# Patient Record
Sex: Female | Born: 1960
Health system: Southern US, Community
[De-identification: ages and names within clinical notes are randomized; demographics above are authoritative.]

## PROBLEM LIST (undated history)

## (undated) DIAGNOSIS — R350 Frequency of micturition: Secondary | ICD-10-CM

## (undated) DIAGNOSIS — Z87442 Personal history of urinary calculi: Secondary | ICD-10-CM

## (undated) DIAGNOSIS — F609 Personality disorder, unspecified: Secondary | ICD-10-CM

## (undated) DIAGNOSIS — R351 Nocturia: Secondary | ICD-10-CM

## (undated) DIAGNOSIS — F419 Anxiety disorder, unspecified: Secondary | ICD-10-CM

## (undated) DIAGNOSIS — T8859XA Other complications of anesthesia, initial encounter: Secondary | ICD-10-CM

## (undated) DIAGNOSIS — R102 Pelvic and perineal pain unspecified side: Secondary | ICD-10-CM

## (undated) DIAGNOSIS — F4010 Social phobia, unspecified: Secondary | ICD-10-CM

## (undated) DIAGNOSIS — R3915 Urgency of urination: Secondary | ICD-10-CM

## (undated) DIAGNOSIS — F250 Schizoaffective disorder, bipolar type: Secondary | ICD-10-CM

## (undated) DIAGNOSIS — R7989 Other specified abnormal findings of blood chemistry: Secondary | ICD-10-CM

## (undated) DIAGNOSIS — K589 Irritable bowel syndrome without diarrhea: Secondary | ICD-10-CM

## (undated) DIAGNOSIS — R51 Headache: Secondary | ICD-10-CM

## (undated) DIAGNOSIS — T4145XA Adverse effect of unspecified anesthetic, initial encounter: Secondary | ICD-10-CM

## (undated) DIAGNOSIS — Z8719 Personal history of other diseases of the digestive system: Secondary | ICD-10-CM

## (undated) DIAGNOSIS — F319 Bipolar disorder, unspecified: Secondary | ICD-10-CM

## (undated) DIAGNOSIS — E785 Hyperlipidemia, unspecified: Secondary | ICD-10-CM

## (undated) HISTORY — DX: Irritable bowel syndrome without diarrhea: K58.9

## (undated) HISTORY — DX: Hyperlipidemia, unspecified: E78.5

## (undated) HISTORY — DX: Other specified abnormal findings of blood chemistry: R79.89

## (undated) HISTORY — DX: Anxiety disorder, unspecified: F41.9

## (undated) HISTORY — DX: Schizoaffective disorder, bipolar type: F25.0

## (undated) HISTORY — DX: Social phobia, unspecified: F40.10

## (undated) HISTORY — PX: OTHER SURGICAL HISTORY: SHX169

## (undated) HISTORY — PX: CARDIAC CATHETERIZATION: SHX172

## (undated) HISTORY — DX: Headache: R51

---

## 1998-07-26 ENCOUNTER — Encounter: Payer: Self-pay | Admitting: Gastroenterology

## 1998-07-26 ENCOUNTER — Ambulatory Visit (HOSPITAL_COMMUNITY): Admission: RE | Admit: 1998-07-26 | Discharge: 1998-07-26 | Payer: Self-pay | Admitting: Gastroenterology

## 1998-08-18 HISTORY — PX: TOTAL ABDOMINAL HYSTERECTOMY: SHX209

## 1998-11-05 ENCOUNTER — Inpatient Hospital Stay (HOSPITAL_COMMUNITY): Admission: RE | Admit: 1998-11-05 | Discharge: 1998-11-07 | Payer: Self-pay | Admitting: Obstetrics and Gynecology

## 1999-08-19 DIAGNOSIS — I209 Angina pectoris, unspecified: Secondary | ICD-10-CM

## 1999-08-19 HISTORY — DX: Angina pectoris, unspecified: I20.9

## 1999-08-26 ENCOUNTER — Emergency Department (HOSPITAL_COMMUNITY): Admission: EM | Admit: 1999-08-26 | Discharge: 1999-08-26 | Payer: Self-pay | Admitting: Emergency Medicine

## 1999-09-12 ENCOUNTER — Encounter: Payer: Self-pay | Admitting: Emergency Medicine

## 1999-09-12 ENCOUNTER — Inpatient Hospital Stay (HOSPITAL_COMMUNITY): Admission: EM | Admit: 1999-09-12 | Discharge: 1999-09-17 | Payer: Self-pay | Admitting: Emergency Medicine

## 1999-09-13 ENCOUNTER — Encounter: Payer: Self-pay | Admitting: Internal Medicine

## 1999-09-16 ENCOUNTER — Encounter: Payer: Self-pay | Admitting: Internal Medicine

## 1999-09-17 ENCOUNTER — Encounter: Payer: Self-pay | Admitting: Internal Medicine

## 1999-09-25 ENCOUNTER — Other Ambulatory Visit: Admission: RE | Admit: 1999-09-25 | Discharge: 1999-09-25 | Payer: Self-pay | Admitting: *Deleted

## 1999-09-29 ENCOUNTER — Emergency Department (HOSPITAL_COMMUNITY): Admission: EM | Admit: 1999-09-29 | Discharge: 1999-09-29 | Payer: Self-pay | Admitting: Emergency Medicine

## 1999-09-29 ENCOUNTER — Encounter: Payer: Self-pay | Admitting: Emergency Medicine

## 1999-10-07 ENCOUNTER — Inpatient Hospital Stay (HOSPITAL_COMMUNITY): Admission: EM | Admit: 1999-10-07 | Discharge: 1999-10-10 | Payer: Self-pay | Admitting: Emergency Medicine

## 1999-11-06 ENCOUNTER — Ambulatory Visit: Admission: RE | Admit: 1999-11-06 | Discharge: 1999-11-06 | Payer: Self-pay | Admitting: Pulmonary Disease

## 2000-03-03 ENCOUNTER — Encounter: Admission: RE | Admit: 2000-03-03 | Discharge: 2000-03-03 | Payer: Self-pay | Admitting: Obstetrics and Gynecology

## 2000-03-03 ENCOUNTER — Encounter: Payer: Self-pay | Admitting: Obstetrics and Gynecology

## 2000-03-11 ENCOUNTER — Inpatient Hospital Stay (HOSPITAL_COMMUNITY): Admission: EM | Admit: 2000-03-11 | Discharge: 2000-03-13 | Payer: Self-pay | Admitting: *Deleted

## 2000-05-12 ENCOUNTER — Encounter: Payer: Self-pay | Admitting: Emergency Medicine

## 2000-05-12 ENCOUNTER — Emergency Department (HOSPITAL_COMMUNITY): Admission: EM | Admit: 2000-05-12 | Discharge: 2000-05-12 | Payer: Self-pay | Admitting: Emergency Medicine

## 2000-06-03 ENCOUNTER — Ambulatory Visit (HOSPITAL_COMMUNITY): Admission: RE | Admit: 2000-06-03 | Discharge: 2000-06-03 | Payer: Self-pay | Admitting: Gastroenterology

## 2000-10-27 ENCOUNTER — Other Ambulatory Visit: Admission: RE | Admit: 2000-10-27 | Discharge: 2000-10-27 | Payer: Self-pay | Admitting: *Deleted

## 2000-11-02 ENCOUNTER — Encounter: Payer: Self-pay | Admitting: Obstetrics and Gynecology

## 2000-11-02 ENCOUNTER — Encounter: Admission: RE | Admit: 2000-11-02 | Discharge: 2000-11-02 | Payer: Self-pay | Admitting: *Deleted

## 2000-11-18 ENCOUNTER — Encounter: Payer: Self-pay | Admitting: Emergency Medicine

## 2000-11-18 ENCOUNTER — Emergency Department (HOSPITAL_COMMUNITY): Admission: EM | Admit: 2000-11-18 | Discharge: 2000-11-18 | Payer: Self-pay | Admitting: Emergency Medicine

## 2001-02-07 ENCOUNTER — Inpatient Hospital Stay (HOSPITAL_COMMUNITY): Admission: EM | Admit: 2001-02-07 | Discharge: 2001-02-09 | Payer: Self-pay | Admitting: Psychiatry

## 2001-10-16 ENCOUNTER — Emergency Department (HOSPITAL_COMMUNITY): Admission: EM | Admit: 2001-10-16 | Discharge: 2001-10-16 | Payer: Self-pay | Admitting: Emergency Medicine

## 2001-10-16 ENCOUNTER — Encounter: Payer: Self-pay | Admitting: Emergency Medicine

## 2001-10-21 ENCOUNTER — Encounter: Payer: Self-pay | Admitting: *Deleted

## 2001-10-21 ENCOUNTER — Encounter: Admission: RE | Admit: 2001-10-21 | Discharge: 2001-10-21 | Payer: Self-pay | Admitting: *Deleted

## 2001-10-29 ENCOUNTER — Encounter: Payer: Self-pay | Admitting: Gastroenterology

## 2001-10-29 ENCOUNTER — Ambulatory Visit (HOSPITAL_COMMUNITY): Admission: RE | Admit: 2001-10-29 | Discharge: 2001-10-29 | Payer: Self-pay | Admitting: Gastroenterology

## 2001-11-03 ENCOUNTER — Other Ambulatory Visit: Admission: RE | Admit: 2001-11-03 | Discharge: 2001-11-03 | Payer: Self-pay | Admitting: Obstetrics and Gynecology

## 2001-11-08 ENCOUNTER — Encounter: Payer: Self-pay | Admitting: Surgery

## 2001-11-08 ENCOUNTER — Ambulatory Visit (HOSPITAL_COMMUNITY): Admission: RE | Admit: 2001-11-08 | Discharge: 2001-11-09 | Payer: Self-pay | Admitting: Surgery

## 2001-11-08 ENCOUNTER — Encounter (INDEPENDENT_AMBULATORY_CARE_PROVIDER_SITE_OTHER): Payer: Self-pay | Admitting: *Deleted

## 2001-11-08 HISTORY — PX: LAPAROSCOPIC CHOLECYSTECTOMY: SUR755

## 2002-03-03 ENCOUNTER — Encounter: Admission: RE | Admit: 2002-03-03 | Discharge: 2002-03-03 | Payer: Self-pay | Admitting: Gastroenterology

## 2002-03-03 ENCOUNTER — Encounter: Payer: Self-pay | Admitting: Gastroenterology

## 2002-03-06 ENCOUNTER — Encounter: Payer: Self-pay | Admitting: *Deleted

## 2002-03-06 ENCOUNTER — Emergency Department (HOSPITAL_COMMUNITY): Admission: EM | Admit: 2002-03-06 | Discharge: 2002-03-06 | Payer: Self-pay | Admitting: *Deleted

## 2002-03-08 ENCOUNTER — Ambulatory Visit (HOSPITAL_COMMUNITY): Admission: RE | Admit: 2002-03-08 | Discharge: 2002-03-08 | Payer: Self-pay | Admitting: Gastroenterology

## 2002-03-10 HISTORY — PX: OTHER SURGICAL HISTORY: SHX169

## 2002-03-11 ENCOUNTER — Encounter (INDEPENDENT_AMBULATORY_CARE_PROVIDER_SITE_OTHER): Payer: Self-pay | Admitting: Specialist

## 2002-03-11 ENCOUNTER — Encounter (INDEPENDENT_AMBULATORY_CARE_PROVIDER_SITE_OTHER): Payer: Self-pay

## 2002-03-11 ENCOUNTER — Observation Stay (HOSPITAL_COMMUNITY): Admission: RE | Admit: 2002-03-11 | Discharge: 2002-03-12 | Payer: Self-pay | Admitting: Obstetrics and Gynecology

## 2002-08-02 ENCOUNTER — Encounter: Admission: RE | Admit: 2002-08-02 | Discharge: 2002-08-02 | Payer: Self-pay | Admitting: Gastroenterology

## 2002-08-02 ENCOUNTER — Encounter: Payer: Self-pay | Admitting: Gastroenterology

## 2002-08-11 ENCOUNTER — Emergency Department (HOSPITAL_COMMUNITY): Admission: EM | Admit: 2002-08-11 | Discharge: 2002-08-11 | Payer: Self-pay | Admitting: Emergency Medicine

## 2002-08-22 ENCOUNTER — Encounter: Payer: Self-pay | Admitting: Gastroenterology

## 2002-08-22 ENCOUNTER — Encounter: Admission: RE | Admit: 2002-08-22 | Discharge: 2002-08-22 | Payer: Self-pay | Admitting: Gastroenterology

## 2002-08-31 ENCOUNTER — Encounter: Admission: RE | Admit: 2002-08-31 | Discharge: 2002-08-31 | Payer: Self-pay | Admitting: Obstetrics and Gynecology

## 2002-08-31 ENCOUNTER — Encounter: Payer: Self-pay | Admitting: Obstetrics and Gynecology

## 2002-09-06 ENCOUNTER — Encounter: Payer: Self-pay | Admitting: Internal Medicine

## 2003-10-16 ENCOUNTER — Emergency Department (HOSPITAL_COMMUNITY): Admission: EM | Admit: 2003-10-16 | Discharge: 2003-10-17 | Payer: Self-pay | Admitting: Emergency Medicine

## 2004-08-06 ENCOUNTER — Emergency Department (HOSPITAL_COMMUNITY): Admission: EM | Admit: 2004-08-06 | Discharge: 2004-08-06 | Payer: Self-pay | Admitting: *Deleted

## 2004-09-30 ENCOUNTER — Ambulatory Visit: Payer: Self-pay | Admitting: Internal Medicine

## 2005-02-24 ENCOUNTER — Ambulatory Visit: Payer: Self-pay | Admitting: Family Medicine

## 2005-07-16 ENCOUNTER — Emergency Department (HOSPITAL_COMMUNITY): Admission: EM | Admit: 2005-07-16 | Discharge: 2005-07-16 | Payer: Self-pay | Admitting: Family Medicine

## 2005-07-21 ENCOUNTER — Ambulatory Visit (HOSPITAL_COMMUNITY): Admission: RE | Admit: 2005-07-21 | Discharge: 2005-07-21 | Payer: Self-pay | Admitting: Gastroenterology

## 2005-11-19 ENCOUNTER — Inpatient Hospital Stay (HOSPITAL_COMMUNITY): Admission: RE | Admit: 2005-11-19 | Discharge: 2005-11-22 | Payer: Self-pay | Admitting: Psychiatry

## 2005-11-20 ENCOUNTER — Ambulatory Visit: Payer: Self-pay | Admitting: Psychiatry

## 2005-12-04 ENCOUNTER — Inpatient Hospital Stay (HOSPITAL_COMMUNITY): Admission: RE | Admit: 2005-12-04 | Discharge: 2005-12-10 | Payer: Self-pay | Admitting: Psychiatry

## 2005-12-12 ENCOUNTER — Ambulatory Visit: Payer: Self-pay | Admitting: Internal Medicine

## 2006-01-01 ENCOUNTER — Encounter: Payer: Self-pay | Admitting: Internal Medicine

## 2006-01-01 ENCOUNTER — Ambulatory Visit: Payer: Self-pay | Admitting: Endocrinology

## 2006-01-07 ENCOUNTER — Ambulatory Visit (HOSPITAL_COMMUNITY): Admission: RE | Admit: 2006-01-07 | Discharge: 2006-01-07 | Payer: Self-pay | Admitting: Endocrinology

## 2006-03-03 LAB — HM DEXA SCAN

## 2006-04-02 ENCOUNTER — Ambulatory Visit: Payer: Self-pay | Admitting: Family Medicine

## 2006-04-04 ENCOUNTER — Emergency Department (HOSPITAL_COMMUNITY): Admission: EM | Admit: 2006-04-04 | Discharge: 2006-04-04 | Payer: Self-pay | Admitting: Emergency Medicine

## 2006-07-17 ENCOUNTER — Ambulatory Visit: Payer: Self-pay | Admitting: Internal Medicine

## 2006-07-17 LAB — CONVERTED CEMR LAB
BUN: 9 mg/dL (ref 6–23)
Basophils Relative: 0.1 % (ref 0.0–1.0)
Calcium: 9.8 mg/dL (ref 8.4–10.5)
Chloride: 105 meq/L (ref 96–112)
Free T4: 0.6 ng/dL — ABNORMAL LOW (ref 0.9–1.8)
GFR calc non Af Amer: 72 mL/min
Glomerular Filtration Rate, Af Am: 87 mL/min/{1.73_m2}
Hemoglobin: 14 g/dL (ref 12.0–15.0)
Lymphocytes Relative: 21.2 % (ref 12.0–46.0)
MCHC: 33.6 g/dL (ref 30.0–36.0)
Monocytes Absolute: 0.5 10*3/uL (ref 0.2–0.7)
Monocytes Relative: 8.4 % (ref 3.0–11.0)
Neutro Abs: 4 10*3/uL (ref 1.4–7.7)
Platelets: 320 10*3/uL (ref 150–400)
T3, Free: 3 pg/mL (ref 2.3–4.2)
TSH: 1.15 microintl units/mL (ref 0.35–5.50)

## 2006-08-06 ENCOUNTER — Ambulatory Visit: Payer: Self-pay | Admitting: Internal Medicine

## 2006-10-12 ENCOUNTER — Encounter: Admission: RE | Admit: 2006-10-12 | Discharge: 2006-10-12 | Payer: Self-pay | Admitting: Internal Medicine

## 2006-10-12 ENCOUNTER — Ambulatory Visit: Payer: Self-pay | Admitting: Internal Medicine

## 2006-10-13 ENCOUNTER — Emergency Department (HOSPITAL_COMMUNITY): Admission: EM | Admit: 2006-10-13 | Discharge: 2006-10-13 | Payer: Self-pay | Admitting: Emergency Medicine

## 2006-11-03 ENCOUNTER — Encounter: Payer: Self-pay | Admitting: Internal Medicine

## 2006-11-18 ENCOUNTER — Encounter (INDEPENDENT_AMBULATORY_CARE_PROVIDER_SITE_OTHER): Payer: Self-pay | Admitting: Specialist

## 2006-11-18 ENCOUNTER — Ambulatory Visit (HOSPITAL_COMMUNITY): Admission: RE | Admit: 2006-11-18 | Discharge: 2006-11-18 | Payer: Self-pay | Admitting: Gastroenterology

## 2007-03-04 ENCOUNTER — Emergency Department (HOSPITAL_COMMUNITY): Admission: EM | Admit: 2007-03-04 | Discharge: 2007-03-04 | Payer: Self-pay | Admitting: Emergency Medicine

## 2007-03-06 ENCOUNTER — Ambulatory Visit: Payer: Self-pay | Admitting: Internal Medicine

## 2007-03-08 ENCOUNTER — Ambulatory Visit: Payer: Self-pay | Admitting: Internal Medicine

## 2007-03-08 DIAGNOSIS — F319 Bipolar disorder, unspecified: Secondary | ICD-10-CM | POA: Insufficient documentation

## 2007-03-08 LAB — CONVERTED CEMR LAB: Lithium Lvl: 0.49 meq/L — ABNORMAL LOW (ref 0.80–1.40)

## 2007-03-12 ENCOUNTER — Encounter: Payer: Self-pay | Admitting: Internal Medicine

## 2007-03-12 LAB — CONVERTED CEMR LAB
BUN: 9 mg/dL (ref 6–23)
GFR calc Af Amer: 100 mL/min
GFR calc non Af Amer: 82 mL/min
Potassium: 4.5 meq/L (ref 3.5–5.1)
Sodium: 140 meq/L (ref 135–145)

## 2007-03-25 ENCOUNTER — Encounter: Payer: Self-pay | Admitting: Internal Medicine

## 2007-03-26 ENCOUNTER — Ambulatory Visit: Payer: Self-pay | Admitting: Internal Medicine

## 2007-03-26 DIAGNOSIS — J309 Allergic rhinitis, unspecified: Secondary | ICD-10-CM

## 2007-03-26 DIAGNOSIS — R51 Headache: Secondary | ICD-10-CM | POA: Insufficient documentation

## 2007-03-26 DIAGNOSIS — R079 Chest pain, unspecified: Secondary | ICD-10-CM | POA: Insufficient documentation

## 2007-03-26 DIAGNOSIS — R5381 Other malaise: Secondary | ICD-10-CM | POA: Insufficient documentation

## 2007-03-26 DIAGNOSIS — R5383 Other fatigue: Secondary | ICD-10-CM

## 2007-03-26 DIAGNOSIS — R519 Headache, unspecified: Secondary | ICD-10-CM | POA: Insufficient documentation

## 2007-03-26 HISTORY — DX: Allergic rhinitis, unspecified: J30.9

## 2007-03-29 ENCOUNTER — Telehealth: Payer: Self-pay | Admitting: Family Medicine

## 2007-05-21 ENCOUNTER — Encounter: Payer: Self-pay | Admitting: *Deleted

## 2007-06-04 ENCOUNTER — Telehealth (INDEPENDENT_AMBULATORY_CARE_PROVIDER_SITE_OTHER): Payer: Self-pay | Admitting: *Deleted

## 2007-06-11 ENCOUNTER — Ambulatory Visit: Payer: Self-pay | Admitting: Internal Medicine

## 2007-06-11 DIAGNOSIS — E559 Vitamin D deficiency, unspecified: Secondary | ICD-10-CM

## 2007-06-11 DIAGNOSIS — M722 Plantar fascial fibromatosis: Secondary | ICD-10-CM | POA: Insufficient documentation

## 2007-06-11 HISTORY — DX: Plantar fascial fibromatosis: M72.2

## 2007-07-03 ENCOUNTER — Telehealth: Payer: Self-pay | Admitting: Family Medicine

## 2007-07-03 ENCOUNTER — Emergency Department (HOSPITAL_COMMUNITY): Admission: EM | Admit: 2007-07-03 | Discharge: 2007-07-03 | Payer: Self-pay | Admitting: Emergency Medicine

## 2007-07-04 ENCOUNTER — Emergency Department (HOSPITAL_COMMUNITY): Admission: EM | Admit: 2007-07-04 | Discharge: 2007-07-04 | Payer: Self-pay | Admitting: Emergency Medicine

## 2007-07-05 ENCOUNTER — Telehealth: Payer: Self-pay | Admitting: Internal Medicine

## 2007-07-05 ENCOUNTER — Ambulatory Visit: Payer: Self-pay | Admitting: Internal Medicine

## 2007-09-02 ENCOUNTER — Encounter: Payer: Self-pay | Admitting: Internal Medicine

## 2007-09-06 ENCOUNTER — Encounter: Payer: Self-pay | Admitting: Internal Medicine

## 2007-10-26 ENCOUNTER — Telehealth: Payer: Self-pay | Admitting: Internal Medicine

## 2007-11-25 ENCOUNTER — Encounter: Payer: Self-pay | Admitting: Internal Medicine

## 2007-11-25 ENCOUNTER — Encounter: Admission: RE | Admit: 2007-11-25 | Discharge: 2007-11-25 | Payer: Self-pay | Admitting: Gastroenterology

## 2007-11-27 ENCOUNTER — Inpatient Hospital Stay (HOSPITAL_COMMUNITY): Admission: EM | Admit: 2007-11-27 | Discharge: 2007-12-02 | Payer: Self-pay | Admitting: Emergency Medicine

## 2007-12-02 ENCOUNTER — Ambulatory Visit: Payer: Self-pay | Admitting: Internal Medicine

## 2007-12-23 ENCOUNTER — Encounter: Payer: Self-pay | Admitting: Internal Medicine

## 2008-01-14 ENCOUNTER — Encounter: Admission: RE | Admit: 2008-01-14 | Discharge: 2008-01-14 | Payer: Self-pay | Admitting: Gastroenterology

## 2008-01-25 ENCOUNTER — Encounter: Payer: Self-pay | Admitting: Internal Medicine

## 2008-02-16 ENCOUNTER — Emergency Department (HOSPITAL_COMMUNITY): Admission: EM | Admit: 2008-02-16 | Discharge: 2008-02-16 | Payer: Self-pay | Admitting: Family Medicine

## 2008-05-11 ENCOUNTER — Ambulatory Visit: Payer: Self-pay | Admitting: Internal Medicine

## 2008-05-11 DIAGNOSIS — N809 Endometriosis, unspecified: Secondary | ICD-10-CM

## 2008-05-11 DIAGNOSIS — R350 Frequency of micturition: Secondary | ICD-10-CM | POA: Insufficient documentation

## 2008-05-11 DIAGNOSIS — M549 Dorsalgia, unspecified: Secondary | ICD-10-CM | POA: Insufficient documentation

## 2008-05-11 HISTORY — DX: Endometriosis, unspecified: N80.9

## 2008-05-11 LAB — CONVERTED CEMR LAB
Bilirubin Urine: NEGATIVE
Glucose, Urine, Semiquant: NEGATIVE
Ketones, urine, test strip: NEGATIVE
Protein, U semiquant: 30
Specific Gravity, Urine: 1.02

## 2008-05-12 ENCOUNTER — Encounter: Payer: Self-pay | Admitting: Internal Medicine

## 2008-06-09 ENCOUNTER — Ambulatory Visit: Payer: Self-pay | Admitting: Internal Medicine

## 2008-06-09 DIAGNOSIS — F4321 Adjustment disorder with depressed mood: Secondary | ICD-10-CM | POA: Insufficient documentation

## 2008-06-12 ENCOUNTER — Ambulatory Visit: Payer: Self-pay | Admitting: Internal Medicine

## 2008-06-12 ENCOUNTER — Telehealth: Payer: Self-pay | Admitting: Internal Medicine

## 2008-06-14 ENCOUNTER — Telehealth: Payer: Self-pay | Admitting: Internal Medicine

## 2008-06-14 ENCOUNTER — Ambulatory Visit: Payer: Self-pay | Admitting: Internal Medicine

## 2008-06-19 LAB — CONVERTED CEMR LAB
ALT: 17 units/L (ref 0–35)
Albumin: 3.9 g/dL (ref 3.5–5.2)
Basophils Relative: 0.8 % (ref 0.0–3.0)
CO2: 26 meq/L (ref 19–32)
CRP, High Sensitivity: 1 (ref 0.00–5.00)
Calcium: 9.1 mg/dL (ref 8.4–10.5)
Creatinine, Ser: 0.7 mg/dL (ref 0.4–1.2)
Glucose, Bld: 84 mg/dL (ref 70–99)
HCT: 41.4 % (ref 36.0–46.0)
Hemoglobin: 14.3 g/dL (ref 12.0–15.0)
Lymphocytes Relative: 24.3 % (ref 12.0–46.0)
MCHC: 34.5 g/dL (ref 30.0–36.0)
Monocytes Relative: 5.9 % (ref 3.0–12.0)
Neutro Abs: 4.1 10*3/uL (ref 1.4–7.7)
Nitrite: NEGATIVE
RBC: 4.63 M/uL (ref 3.87–5.11)
Total CK: 84 units/L (ref 7–177)
Total Protein: 7 g/dL (ref 6.0–8.3)
Urobilinogen, UA: 0.2

## 2008-06-20 ENCOUNTER — Emergency Department (HOSPITAL_COMMUNITY): Admission: EM | Admit: 2008-06-20 | Discharge: 2008-06-21 | Payer: Self-pay | Admitting: Emergency Medicine

## 2008-06-21 ENCOUNTER — Ambulatory Visit: Payer: Self-pay | Admitting: *Deleted

## 2008-06-21 ENCOUNTER — Inpatient Hospital Stay (HOSPITAL_COMMUNITY): Admission: AD | Admit: 2008-06-21 | Discharge: 2008-06-27 | Payer: Self-pay | Admitting: *Deleted

## 2008-07-07 ENCOUNTER — Encounter: Payer: Self-pay | Admitting: Internal Medicine

## 2008-07-24 ENCOUNTER — Encounter: Payer: Self-pay | Admitting: Internal Medicine

## 2008-09-15 ENCOUNTER — Ambulatory Visit: Payer: Self-pay | Admitting: Internal Medicine

## 2008-09-15 LAB — CONVERTED CEMR LAB: Rapid Strep: NEGATIVE

## 2008-09-16 ENCOUNTER — Encounter: Payer: Self-pay | Admitting: Internal Medicine

## 2008-10-03 ENCOUNTER — Encounter: Payer: Self-pay | Admitting: Internal Medicine

## 2008-10-27 ENCOUNTER — Encounter: Admission: RE | Admit: 2008-10-27 | Discharge: 2008-10-27 | Payer: Self-pay | Admitting: Obstetrics and Gynecology

## 2008-11-05 ENCOUNTER — Emergency Department (HOSPITAL_COMMUNITY): Admission: EM | Admit: 2008-11-05 | Discharge: 2008-11-05 | Payer: Self-pay | Admitting: Emergency Medicine

## 2008-11-06 ENCOUNTER — Telehealth (INDEPENDENT_AMBULATORY_CARE_PROVIDER_SITE_OTHER): Payer: Self-pay | Admitting: *Deleted

## 2008-11-07 ENCOUNTER — Encounter: Payer: Self-pay | Admitting: Internal Medicine

## 2008-11-07 ENCOUNTER — Encounter: Payer: Self-pay | Admitting: *Deleted

## 2008-11-07 LAB — CONVERTED CEMR LAB: HCT: 41.7 %

## 2008-11-10 ENCOUNTER — Encounter: Payer: Self-pay | Admitting: Internal Medicine

## 2008-11-10 ENCOUNTER — Encounter: Admission: RE | Admit: 2008-11-10 | Discharge: 2008-11-10 | Payer: Self-pay | Admitting: Gastroenterology

## 2008-11-27 ENCOUNTER — Encounter: Payer: Self-pay | Admitting: *Deleted

## 2008-12-12 ENCOUNTER — Encounter: Payer: Self-pay | Admitting: Internal Medicine

## 2009-01-17 ENCOUNTER — Encounter: Payer: Self-pay | Admitting: Internal Medicine

## 2009-02-13 ENCOUNTER — Encounter: Payer: Self-pay | Admitting: Internal Medicine

## 2009-06-13 ENCOUNTER — Telehealth: Payer: Self-pay | Admitting: *Deleted

## 2009-06-13 ENCOUNTER — Ambulatory Visit: Payer: Self-pay | Admitting: Internal Medicine

## 2009-06-18 ENCOUNTER — Ambulatory Visit: Payer: Self-pay | Admitting: Internal Medicine

## 2009-06-18 DIAGNOSIS — R339 Retention of urine, unspecified: Secondary | ICD-10-CM | POA: Insufficient documentation

## 2009-06-18 LAB — CONVERTED CEMR LAB
Bilirubin Urine: NEGATIVE
Blood in Urine, dipstick: NEGATIVE
Glucose, Urine, Semiquant: NEGATIVE
Ketones, urine, test strip: NEGATIVE
Nitrite: NEGATIVE
Protein, U semiquant: NEGATIVE
Specific Gravity, Urine: 1.01
Urobilinogen, UA: 0.2
WBC Urine, dipstick: NEGATIVE
pH: 7

## 2009-06-21 ENCOUNTER — Telehealth: Payer: Self-pay | Admitting: Internal Medicine

## 2009-06-22 LAB — CONVERTED CEMR LAB
ALT: 17 units/L (ref 0–35)
AST: 23 units/L (ref 0–37)
Albumin: 3.9 g/dL (ref 3.5–5.2)
Alkaline Phosphatase: 60 units/L (ref 39–117)
Basophils Absolute: 0 10*3/uL (ref 0.0–0.1)
CRP, High Sensitivity: 7.6 — ABNORMAL HIGH (ref 0.00–5.00)
Calcium: 9 mg/dL (ref 8.4–10.5)
GFR calc non Af Amer: 81.56 mL/min (ref >60)
Glucose, Bld: 123 mg/dL — ABNORMAL HIGH (ref 70–99)
Hemoglobin: 13.5 g/dL (ref 12.0–15.0)
Lymphocytes Relative: 19.3 % (ref 12.0–46.0)
Monocytes Relative: 8.2 % (ref 3.0–12.0)
Neutrophils Relative %: 67.6 % (ref 43.0–77.0)
Platelets: 282 10*3/uL (ref 150.0–400.0)
RDW: 12 % (ref 11.5–14.6)
Sodium: 141 meq/L (ref 135–145)
T3, Free: 3.2 pg/mL (ref 2.3–4.2)
Total Protein: 6.7 g/dL (ref 6.0–8.3)

## 2009-07-20 ENCOUNTER — Encounter: Payer: Self-pay | Admitting: Internal Medicine

## 2009-07-28 ENCOUNTER — Telehealth: Payer: Self-pay | Admitting: Family Medicine

## 2009-07-30 ENCOUNTER — Telehealth: Payer: Self-pay | Admitting: Internal Medicine

## 2009-07-30 ENCOUNTER — Ambulatory Visit: Payer: Self-pay | Admitting: Internal Medicine

## 2009-07-30 DIAGNOSIS — R269 Unspecified abnormalities of gait and mobility: Secondary | ICD-10-CM | POA: Insufficient documentation

## 2009-10-29 ENCOUNTER — Telehealth: Payer: Self-pay | Admitting: Internal Medicine

## 2009-10-31 ENCOUNTER — Telehealth: Payer: Self-pay | Admitting: *Deleted

## 2009-11-01 ENCOUNTER — Ambulatory Visit: Payer: Self-pay | Admitting: Internal Medicine

## 2009-11-14 ENCOUNTER — Encounter: Payer: Self-pay | Admitting: Internal Medicine

## 2009-12-18 ENCOUNTER — Emergency Department (HOSPITAL_COMMUNITY): Admission: EM | Admit: 2009-12-18 | Discharge: 2009-12-18 | Payer: Self-pay | Admitting: Emergency Medicine

## 2009-12-19 ENCOUNTER — Encounter: Payer: Self-pay | Admitting: Internal Medicine

## 2009-12-20 ENCOUNTER — Encounter: Payer: Self-pay | Admitting: Internal Medicine

## 2010-02-19 ENCOUNTER — Telehealth: Payer: Self-pay | Admitting: Internal Medicine

## 2010-02-20 ENCOUNTER — Telehealth: Payer: Self-pay | Admitting: Internal Medicine

## 2010-02-22 ENCOUNTER — Telehealth (INDEPENDENT_AMBULATORY_CARE_PROVIDER_SITE_OTHER): Payer: Self-pay | Admitting: *Deleted

## 2010-02-23 ENCOUNTER — Emergency Department (HOSPITAL_COMMUNITY): Admission: EM | Admit: 2010-02-23 | Discharge: 2010-02-23 | Payer: Self-pay | Admitting: Emergency Medicine

## 2010-02-25 ENCOUNTER — Encounter: Payer: Self-pay | Admitting: Internal Medicine

## 2010-02-25 ENCOUNTER — Telehealth (INDEPENDENT_AMBULATORY_CARE_PROVIDER_SITE_OTHER): Payer: Self-pay | Admitting: *Deleted

## 2010-02-25 ENCOUNTER — Telehealth: Payer: Self-pay | Admitting: Internal Medicine

## 2010-02-26 ENCOUNTER — Telehealth: Payer: Self-pay | Admitting: *Deleted

## 2010-03-01 ENCOUNTER — Encounter: Payer: Self-pay | Admitting: Internal Medicine

## 2010-05-17 ENCOUNTER — Ambulatory Visit: Payer: Self-pay | Admitting: Internal Medicine

## 2010-05-17 DIAGNOSIS — R112 Nausea with vomiting, unspecified: Secondary | ICD-10-CM | POA: Insufficient documentation

## 2010-05-17 LAB — CONVERTED CEMR LAB
Calcium: 9.1 mg/dL (ref 8.4–10.5)
Eosinophils Relative: 4.8 % (ref 0.0–5.0)
GFR calc non Af Amer: 77.86 mL/min (ref >60)
HCT: 38.4 % (ref 36.0–46.0)
Hemoglobin: 13.1 g/dL (ref 12.0–15.0)
Lymphocytes Relative: 18.8 % (ref 12.0–46.0)
Lymphs Abs: 1.4 10*3/uL (ref 0.7–4.0)
Monocytes Relative: 8.2 % (ref 3.0–12.0)
Neutro Abs: 5 10*3/uL (ref 1.4–7.7)
Platelets: 278 10*3/uL (ref 150.0–400.0)
Potassium: 4.4 meq/L (ref 3.5–5.1)
Sodium: 137 meq/L (ref 135–145)
WBC: 7.4 10*3/uL (ref 4.5–10.5)

## 2010-05-20 LAB — CONVERTED CEMR LAB: Lithium Lvl: 0.67 meq/L — ABNORMAL LOW (ref 0.80–1.40)

## 2010-06-10 ENCOUNTER — Telehealth: Payer: Self-pay | Admitting: *Deleted

## 2010-06-21 ENCOUNTER — Ambulatory Visit: Payer: Self-pay | Admitting: Internal Medicine

## 2010-06-21 ENCOUNTER — Telehealth: Payer: Self-pay | Admitting: Internal Medicine

## 2010-07-19 ENCOUNTER — Telehealth: Payer: Self-pay | Admitting: Internal Medicine

## 2010-07-29 ENCOUNTER — Telehealth: Payer: Self-pay | Admitting: Internal Medicine

## 2010-08-27 ENCOUNTER — Telehealth: Payer: Self-pay | Admitting: Internal Medicine

## 2010-08-27 ENCOUNTER — Ambulatory Visit
Admission: RE | Admit: 2010-08-27 | Discharge: 2010-08-27 | Payer: Self-pay | Source: Home / Self Care | Attending: Internal Medicine | Admitting: Internal Medicine

## 2010-08-27 DIAGNOSIS — R209 Unspecified disturbances of skin sensation: Secondary | ICD-10-CM | POA: Insufficient documentation

## 2010-08-27 DIAGNOSIS — IMO0002 Reserved for concepts with insufficient information to code with codable children: Secondary | ICD-10-CM | POA: Insufficient documentation

## 2010-09-08 ENCOUNTER — Encounter: Payer: Self-pay | Admitting: Obstetrics and Gynecology

## 2010-09-08 ENCOUNTER — Encounter: Payer: Self-pay | Admitting: Psychiatry

## 2010-09-13 ENCOUNTER — Telehealth: Payer: Self-pay | Admitting: Internal Medicine

## 2010-09-15 LAB — CONVERTED CEMR LAB
Albumin: 3.8 g/dL (ref 3.5–5.2)
BUN: 12 mg/dL (ref 6–23)
Creatinine, Ser: 0.7 mg/dL (ref 0.4–1.2)
GFR calc Af Amer: 116 mL/min
GFR calc non Af Amer: 96 mL/min
Potassium: 4.4 meq/L (ref 3.5–5.1)
Sodium: 134 meq/L — ABNORMAL LOW (ref 135–145)
Total Bilirubin: 1.5 mg/dL — ABNORMAL HIGH (ref 0.3–1.2)

## 2010-09-16 ENCOUNTER — Encounter: Payer: Self-pay | Admitting: Internal Medicine

## 2010-09-16 ENCOUNTER — Ambulatory Visit
Admission: RE | Admit: 2010-09-16 | Discharge: 2010-09-16 | Payer: Self-pay | Source: Home / Self Care | Attending: Internal Medicine | Admitting: Internal Medicine

## 2010-09-16 ENCOUNTER — Other Ambulatory Visit: Payer: Self-pay | Admitting: Internal Medicine

## 2010-09-16 DIAGNOSIS — M6281 Muscle weakness (generalized): Secondary | ICD-10-CM | POA: Insufficient documentation

## 2010-09-16 LAB — CONVERTED CEMR LAB
ALT: 12 units/L (ref 0–35)
AST: 13 units/L (ref 0–37)
Albumin: 4.4 g/dL (ref 3.5–5.2)
BUN: 12 mg/dL (ref 6–23)
Bilirubin, Direct: 0.1 mg/dL (ref 0.0–0.3)
Blood in Urine, dipstick: NEGATIVE
CO2: 23 meq/L (ref 19–32)
Chloride: 107 meq/L (ref 96–112)
Creatinine, Ser: 0.69 mg/dL (ref 0.40–1.20)
Glucose, Bld: 87 mg/dL (ref 70–99)
Iron: 65 ug/dL (ref 42–145)
Ketones, urine, test strip: NEGATIVE
Nitrite: NEGATIVE
Potassium: 4.4 meq/L (ref 3.5–5.3)
Protein, U semiquant: NEGATIVE
Specific Gravity, Urine: <1.005
Total CK: 71 units/L (ref 7–177)
Total Protein: 6.8 g/dL (ref 6.0–8.3)
Urobilinogen, UA: 0.2
WBC Urine, dipstick: NEGATIVE

## 2010-09-17 ENCOUNTER — Telehealth: Payer: Self-pay

## 2010-09-17 LAB — CBC WITH DIFFERENTIAL/PLATELET
Basophils Absolute: 0 10*3/uL (ref 0.0–0.1)
Basophils Relative: 0.4 % (ref 0.0–3.0)
HCT: 38.3 % (ref 36.0–46.0)
Hemoglobin: 13 g/dL (ref 12.0–15.0)
Lymphocytes Relative: 21.6 % (ref 12.0–46.0)
Lymphs Abs: 1.5 10*3/uL (ref 0.7–4.0)
Monocytes Relative: 8.8 % (ref 3.0–12.0)
Neutro Abs: 4.7 10*3/uL (ref 1.4–7.7)
RBC: 4.11 Mil/uL (ref 3.87–5.11)
RDW: 12.9 % (ref 11.5–14.6)

## 2010-09-19 NOTE — Assessment & Plan Note (Signed)
Summary: headaches//ccm   Vital Signs:  Patient profile:   50 year old female Menstrual status:  hysterectomy Weight:      138 pounds Temp:     97.8 degrees F oral Pulse rate:   72 / minute BP sitting:   110 / 80  (right arm) Cuff size:   regular  Vitals Entered By: Romualdo Bolk, CMA (AAMA) (June 21, 2010 11:10 AM) CC: Headache that feels like it is all over her head. Pt is having nausea with it. It started on 11/3 in am, when to sleep then woke up with it. No vomiting. Pt is having senitivity to light and sound. Motrin not helping with ha.    History of Present Illness: [name]   comes in today  for acute visit for Headache.      Onset last pm  driving to work  left temmple and then spread to whole head  .  and crescendo HA and went home ( because last time   got NV)  this time she has nausea  byut no vomiting or acute vision change .   Took ibuprofen and no help with HA ? if vision  off recently .   States she needs eye exam.  Last time the steroids  helped her HA .  Psych No change in meds . no sig alcohol caffiene diet change   Preventive Screening-Counseling & Management  Alcohol-Tobacco     Alcohol drinks/day: <1     Alcohol type: wine, beer     Smoking Status: quit     Year Quit: 1 week ago  Caffeine-Diet-Exercise     Caffeine use/day: 1     Does Patient Exercise: yes  Current Medications (verified): 1)  Klonopin 1 Mg  Tabs (Clonazepam) .[name] Kitchen.. 1 At Bedtime 2)  Estradiol 1 Mg  Tabs (Estradiol) .[name] Kitchen.. 1 Once Daily 3)  Lithium Carbonate 600 Mg Caps (Lithium Carbonate) .[name] Kitchen.. 1 By Mouth At Bedtime 4)  Lamictal 200 Mg Tabs (Lamotrigine) .[name] Kitchen.. 1 By Mouth At Bedtime 5)  Vicodin 5-500 Mg Tabs (Hydrocodone-Acetaminophen) 6)  Zofran 4 Mg Tabs (Ondansetron Hcl) 7)  Promethazine Hcl 25 Mg Tabs (Promethazine Hcl) .[name] Kitchen.. 1 By Mouth Q4-6 Hours As Needed Nausea and Vomiting  Allergies (verified): No Known Drug Allergies  Past History:  Past medical, surgical, family  and social histories (including risk factors) reviewed, and no changes noted (except as noted below).  Past Medical History: bipolar disease Allergic rhinitis Headache hx endometriosis    total hysterectomy  Hosp  11 10 [facility] GI evaluation  for pain IBS dx  Consults [facility]. Dr. Lavonia Drafts  Past Surgical History: Reviewed history from 06/12/2008 and no changes required. Cholecystectomy Hysterectomy Oophorectomy    Past History:  Care Management: Psychiatry: Bunnie Pion- Ph number [phone]  Gynecology: Rosalio Macadamia GI : mann  Family History: Reviewed history from 07/30/2009 and no changes required. mom  bronchiectesis    Father   OSA  Sis with HAs hormonally  mediated  Social History: Reviewed history from 05/17/2010 and no changes required. 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  The patient denies fever, vision loss, decreased hearing, hoarseness, dyspnea on exertion, abdominal pain, melena, hematochezia, severe indigestion/heartburn, transient blindness, abnormal bleeding, enlarged lymph nodes, and angioedema.    Physical Exam  General:  wd wn in mild  distress with photophobia  Head:  normocephalic and atraumatic.   Eyes:  vision grossly intact and pupils equal.   Ears:  R ear normal, L ear normal, and no external deformities.   Mouth:  pharynx pink and moist.   Neck:  No deformities, masses, or tenderness noted. Lungs:  Normal respiratory effort, chest expands symmetrically. Lungs are clear to auscultation, no crackles or wheezes. Heart:  Normal rate and regular rhythm. S1 and S2 normal without gallop, murmur, click, rub or other extra sounds. Abdomen:  Bowel sounds positive,abdomen soft and non-tender without masses, organomegaly or   noted. Pulses:  pulses intact without delay   Extremities:  no clubbing cyanosis or edema  Neurologic:   non focal oritented nl speech  Skin:  turgor normal, color normal, and no petechiae.   Cervical Nodes:  No lymphadenopathy noted Psych:  Oriented X3, memory intact for recent and remote, not anxious appearing, and not depressed appearing.     Impression & Recommendations:  Problem # 1:  HEADACHE (ICD-784.0) had one last month  about the same time .  sounds migraine with episode and context . she states she doesnt have a hx of same but sister  does and was rx with hormonal rx.   no other alarm signs .  To do a ha calendar  and  samples of maxalt  given and to do HA referral.   note for work last pm Her updated medication list for this problem includes:    Vicodin 5-500 Mg Tabs (Hydrocodone-acetaminophen)    Maxalt 10 Mg Tabs (Rizatriptan benzoate) .[name] Kitchen... 1 by mouth at onset of ha and can repeat in 2-4 hours  Orders: Depo- Medrol 40mg  (J1030) Admin of Therapeutic Inj  intramuscular or subcutaneous (16109) Headache Clinic Referral (Headache)  Problem # 2:  DISORDER, BIPOLAR NOS (ICD-296.80) stable   Complete Medication List: 1)  Klonopin 1 Mg Tabs (Clonazepam) .[name] Kitchen.. 1 at bedtime 2)  Estradiol 1 Mg Tabs (Estradiol) .[name] Kitchen.. 1 once daily 3)  Lithium Carbonate 600 Mg Caps (Lithium carbonate) .[name] Kitchen.. 1 by mouth at bedtime 4)  Lamictal 200 Mg Tabs (Lamotrigine) .[name] Kitchen.. 1 by mouth at bedtime 5)  Vicodin 5-500 Mg Tabs (Hydrocodone-acetaminophen) 6)  Zofran 4 Mg Tabs (Ondansetron hcl) 7)  Promethazine Hcl 25 Mg Tabs (Promethazine hcl) .[name] Kitchen.. 1 by mouth q4-6 hours as needed nausea and vomiting 8)  Maxalt 10 Mg Tabs (Rizatriptan benzoate) .[name] Kitchen.. 1 by mouth at onset of ha and can repeat in 2-4 hours  Patient Instructions: 1)  can take the promethizine    2)  can try MAxalt sample  and rx if needed.  3)  track HAs   4)  Can do HA referral    Medication Administration  Injection # 1:    Medication: Depo- Medrol 40mg     Diagnosis: HEADACHE (ICD-784.0)    Route: IM    Site: RUOQ gluteus    Exp Date:  01/16/2013    Lot #: [id number]    Mfr: Pharmacia    Comments: Gave 120mg     Patient tolerated injection without complications    Given by: Romualdo Bolk, CMA (AAMA) (June 21, 2010 12:27 PM)  Orders Added: 1)  Depo- Medrol 40mg  [J1030] 2)  Admin of Therapeutic Inj  intramuscular or subcutaneous [96372] 3)  Est. Patient Level IV [60454] 4)  Headache Clinic Referral [Headache]

## 2010-09-19 NOTE — Progress Notes (Signed)
Summary: appt  Phone Note Call from Patient Call back at Home Phone [phone] Call back at lm   Caller: vm at 4:58 Summary of Call: CP, abd pain, GI out of town another week or two& said to see you.  Early appt tomorrow before 57 or Fri.    Initial call taken by: Rudy Jew, RN,  February 20, 2010 5:04 PM  Follow-up for Phone Call        She has a hx of chest pain and abdominal pain   evaluated by specialist s   .     Monday July 18th    9 45 am sda ok     If she thinks it is more urgent than that call with details .  review  E chart record : Abd pevic CT scans done iJuly2003 Dec 2003 march 2005, Feb 2008 april 2009 march 2010 through [place] system.  Had ed visit for abd pain and diarrhea seen by Dr Hennie Duos Dec 26 2009. and given hyonmax.   Remote hx of delayed gastric emptying study in the past. Follow-up by: [name] MD,  February 20, 2010 5:32 PM  Additional Follow-up for Phone Call Additional follow up Details #1::        Left message for pt to return my call. Additional Follow-up by: Josph Macho RMA,  February 21, 2010 9:10 AM

## 2010-09-19 NOTE — Progress Notes (Signed)
Summary: Urine Incontinence, FYI  Phone Note Call from Patient   Caller: Patient Call For: [name] MD Summary of Call: Pt calling to report fatigue, headache, urine inconcinence and has been running a low grade fever 99.0 for a few days.  Pt denies urgency, frequency, dysuria of hematuria.  Offered OV this afternoon, pt "is too sick to get out".  Pt will call and make appt with her OB/GYN FYI Initial call taken by: Sid Falcon LPN,  July 19, 2010 2:15 PM

## 2010-09-19 NOTE — Progress Notes (Signed)
Summary: Rx Refill  Phone Note Refill Request Call back at Home Phone [phone] Message from:  Patient  Refills Requested: Medication #1:  ESTRADIOL 1 MG  TABS 1 once daily  Method Requested: Electronic Initial call taken by: Trixie Dredge,  February 19, 2010 5:25 PM    Prescriptions: ESTRADIOL 1 MG  TABS (ESTRADIOL) 1 once daily  #30 x 5   Entered by:   Raechel Ache, RN   Authorized by:   [name] MD   Signed by:   Raechel Ache, RN on 02/19/2010   Method used:   [facility]  [address] [phone]* (retail)       [address]       [place], [place]  [address]       Ph: [phone] or [phone]       Fax: [phone]   RxID:   [id number]

## 2010-09-19 NOTE — Progress Notes (Signed)
Summary: Pt called re: MRI scan. Pls call back.  Phone Note Call from Patient Call back at [facility] Phone [phone]   Caller: Patient Summary of Call: Pt called back again and said that she and Dr. Rodena Medin has discussed an MRI that Dr Rodena Medin had a copy of. Pt said lower back was not scanned, probably because she told the tech that her back was to be scanned later, so the tech didnt do the mri on lower back.   Initial call taken by: Lucy Antigua,  August 27, 2010 4:51 PM  Follow-up for Phone Call        noted. proceed with neurology consult as discussed. likely will need lumbar mri and anticipate neuro will discuss Follow-up by: Edwyna Perfect MD,  August 27, 2010 5:14 PM     Appended Document: Pt called re: MRI scan. Pls call back. left messgae to call back  Appended Document: Pt called re: MRI scan. Pls call back. left message for pt to call with any further questions

## 2010-09-19 NOTE — Progress Notes (Signed)
Summary: please advise  Phone Note Call from Patient Call back at Home Phone [phone]   Caller: Patient---triage vm Summary of Call: Has followed up with her GYN and her headache wellness clinic, Dr Sharene Skeans.  Dr Sharene Skeans thinks that her headaches were not hormonal. She thinks that she has some kind of compression with her spine or a bulging disc. she has urinary continence, weakness and pain in her legs. she also left a message with Dr Sharene Skeans about her pains. please advise. Initial call taken by: Warnell Forester,  July 29, 2010 10:16 AM  Follow-up for Phone Call         Please call patient  unsure what to do  with this message .  [facility] Dr Catalina Lunger will do apporpriate referral  .  an send [place] a note about this Follow-up by: [name] MD,  July 29, 2010 12:40 PM  Additional Follow-up for Phone Call Additional follow up Details #1::        Left message to call back. Additional Follow-up by: Romualdo Bolk, CMA Duncan Dull),  July 29, 2010 1:33 PM    Additional Follow-up for Phone Call Additional follow up Details #2::    LMTOCB Romualdo Bolk, CMA Duncan Dull)  July 29, 2010 4:37 PM LMTOCB Romualdo Bolk, CMA Duncan Dull)  July 30, 2010 2:03 PM Pt never called back. Follow-up by: Romualdo Bolk, CMA (AAMA),  August 01, 2010 11:20 AM

## 2010-09-19 NOTE — Letter (Signed)
Summary: Records from [facility] 2010  Records from [facility] 2010   Imported By: Maryln Gottron 08/30/2009 10:26:11  _____________________________________________________________________  External Attachment:    Type:   Image     Comment:   External Document

## 2010-09-19 NOTE — Progress Notes (Signed)
Summary: walk in triage  Phone Note Call from Patient   Caller: Patient Call For: [name] MD Summary of Call: Pt. walks in stating she was seen in the ER this weekend with LLQ pain, difficulty with urination, and n/v.  Discussed at length with pt,and determined that no acute findings in the ER, but she still complains of urinary difficulty and LLQ pain.  Will see Dr. Fabian Sharp at 3 pm.  See Triage sheet also, please. Initial call taken by: Lynann Beaver CMA,  February 25, 2010 8:38 AM  Follow-up for Phone Call        Pt called back and said that she has decided to go to [facility] today for her condition. She wanted to make Dr. Fabian Sharp aware.  Follow-up by: Lucy Antigua,  February 25, 2010 9:21 AM

## 2010-09-19 NOTE — Progress Notes (Signed)
Summary: switch pcp  Phone Note Call from Patient Call back at Home Phone [phone]   Caller: Patient Call For: [name] MD Summary of Call: pt would like to switch to dr Dawnita Molner. Initial call taken by: Heron Sabins,  August 27, 2010 3:36 PM  Follow-up for Phone Call        have no objection Follow-up by: Edwyna Perfect MD,  August 28, 2010 9:00 AM  Additional Follow-up for Phone Call Additional follow up Details #1::        ok Additional Follow-up by: [name] MD,  August 29, 2010 6:07 PM    Additional Follow-up for Phone Call Additional follow up Details #2::    lmom Follow-up by: Heron Sabins,  September 02, 2010 11:07 AM  Additional Follow-up for Phone Call Additional follow up Details #3:: Details for Additional Follow-up Action Taken: pt is aware Additional Follow-up by: Heron Sabins,  September 04, 2010 8:31 AM

## 2010-09-19 NOTE — Progress Notes (Signed)
Summary: Call A Nurse   Call-A-Nurse Triage Call Report Triage Record Num: [id number] Operator: Albertine Grates Patient Name: [facility] Call Date & Time: 06/20/2010 6:36:04PM Patient Phone: [phone] PCP: Patient Gender: Female PCP Fax : Patient DOB: 1960-09-02 [facility] Name: [facility] Reason for Call: Has migraine 11-3 and is wanting med called to help. Has taken Motrin but does not help. Was seen in office 1 month ago for migraine and was first occurance of migraine. Has never taken med for migraine. Works evening shift and is wanting med called in. Has tenderness over temple area that is new for her. Advised would have to be seen in ED. Protocol(s) Used: Headache Recommended Outcome per Protocol: See Provider within 4 hours Reason for Outcome: New tenderness over temporal area Care Advice:  ~ 11/

## 2010-09-19 NOTE — Progress Notes (Signed)
Summary: Call-A-Nurse Report    Call-A-Nurse Triage Call Report Triage Record Num: [id number] Operator: Vernell Barrier Patient Name: [name] Call Date & Time: 02/22/2010 6:11:30PM Patient Phone: [phone] PCP: Patient Gender: Female PCP Fax : Patient DOB: 1961/07/17 Practice Name: [facility] Reason for Call: LMP- Hysterectomy. Caller calling requesting appt for ongoing sx of diarrhea,nausea, weakness, sore throat. Onset about 2-3 weeks ago. Afebrile. Caller will call office in am to make appt. Protocol(s) Used: Office Note Recommended Outcome per Protocol: Information Noted and Sent to Office Reason for Outcome: Caller information to office Care Advice:  ~ 02/22/2010 6:25:14PM Page 1 of 1 [id number]

## 2010-09-19 NOTE — Progress Notes (Signed)
Summary: Pt req call back from [facility] re: severe head pressure-triage  Phone Note Call from Patient Call back at [facility] Phone [phone]   Caller: Patient Summary of Call: Pt called and said that Dr Catalina Lunger, at [facility], told pt that she would need to see another type of Neurologist re: head pressure and numbness in back and legs. Pt can not eat. Pls call pt asap today.  Initial call taken by: Lucy Antigua,  August 27, 2010 10:29 AM  Follow-up for Phone Call        Appt made with Dr. Rodena Medin. Follow-up by: Lynann Beaver CMA AAMA,  August 27, 2010 10:44 AM

## 2010-09-19 NOTE — Assessment & Plan Note (Signed)
Summary: requesting neurological referral/dm   Vital Signs:  Patient profile:   50 year old female Menstrual status:  hysterectomy Weight:      133 pounds Pulse rate:   74 / minute BP sitting:   100 / 64  (left arm)  Vitals Entered By: Kyung Rudd, CMA (August 27, 2010 2:24 PM) CC: pt wants neurology referral. c/o HA and pressure, numbness in legs and feet, pain in lower back and neck x 3 wks    CC:  pt wants neurology referral. c/o HA and pressure, numbness in legs and feet, and pain in lower back and neck x 3 wks .  History of Present Illness: Patient presents to clinic as a workin for evaluation of headaches and possible lumbar radicular symptoms. [facility] dc summary reviewed. Had symptoms of possible lumbar radiculopathy, tremor and headaches. Evaluation included cranial mri with nonspecific WM changes, cervical and thoracic mri without finding of nerve impingement. No documentation of LS MRI noted. PE LE strength documented 5/5. Pt has been seeing headache clinic neurologist but was recommended for general neurology consult.   Current Medications (verified): 1)  Klonopin 1 Mg  Tabs (Clonazepam) .[name] Kitchen.. 1 At Bedtime 2)  Estradiol 1 Mg  Tabs (Estradiol) .[name] Kitchen.. 1 Once Daily 3)  Lithium Carbonate 600 Mg Caps (Lithium Carbonate) .[name] Kitchen.. 1 By Mouth At Bedtime 4)  Lamictal 200 Mg Tabs (Lamotrigine) .[name] Kitchen.. 1 By Mouth At Bedtime 5)  Vicodin 5-500 Mg Tabs (Hydrocodone-Acetaminophen) 6)  Zofran 4 Mg Tabs (Ondansetron Hcl) 7)  Promethazine Hcl 25 Mg Tabs (Promethazine Hcl) .[name] Kitchen.. 1 By Mouth Q4-6 Hours As Needed Nausea and Vomiting 8)  Imitrex 100 Mg Tabs (Sumatriptan Succinate) .[name] Kitchen.. 1 Tab At Onset of Migraine and Repeat in 2 Hours Not To Exceed More Than 2 in A 24 Hour Period  Allergies (verified): No Known Drug Allergies  Past History:  Past medical, surgical, family and social histories (including risk factors) reviewed, and no changes noted (except as noted below).  Past Medical  History: Reviewed history from 06/21/2010 and no changes required. bipolar disease Allergic rhinitis Headache hx endometriosis    total hysterectomy  Hosp  11 10 [facility] GI evaluation  for pain IBS dx  Consults [facility]. Dr. Lavonia Drafts  Past Surgical History: Reviewed history from 06/12/2008 and no changes required. Cholecystectomy Hysterectomy Oophorectomy    Family History: Reviewed history from 06/21/2010 and no changes required. mom  bronchiectesis    Father   OSA  Sis with HAs hormonally  mediated  Social History: Reviewed history from 05/17/2010 and no changes required. 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 MS:  Complains of low back pain; denies loss of strength, muscle weakness, and thoracic pain. Neuro:  Complains of headaches; denies brief paralysis, falling down, and weakness.  Physical Exam  General:  Well-developed,well-nourished,in no acute distress; alert,appropriate and cooperative throughout examination Head:  Normocephalic and atraumatic without obvious abnormalities. No apparent alopecia or balding. Eyes:  pupils equal, pupils round, pupils reactive to light, and corneas and lenses clear.   Ears:  no external deformities.   Nose:  no external deformity.   Neurologic:  alert & oriented X3, cranial nerves II-XII intact, strength normal in all extremities, gait normal, and finger-to-nose normal.   Skin:  turgor normal, color normal, and no rashes.     Impression & Recommendations:  Problem # 1:  LUMBAR  RADICULOPATHY (ICD-724.4) Assessment New Constellation of symptoms include unexplained ha and leg paresthesias. Proceed with general neurology consult. Forward [place] [facility] records for consult.   Orders: Neurology Referral (Neuro)  Complete Medication List: 1)  Klonopin 1 Mg Tabs (Clonazepam) .[name] Kitchen.. 1 at bedtime 2)  Estradiol 1 Mg Tabs (Estradiol) .[name] Kitchen..  1 once daily 3)  Lithium Carbonate 600 Mg Caps (Lithium carbonate) .[name] Kitchen.. 1 by mouth at bedtime 4)  Lamictal 200 Mg Tabs (Lamotrigine) .[name] Kitchen.. 1 by mouth at bedtime 5)  Vicodin 5-500 Mg Tabs (Hydrocodone-acetaminophen) 6)  Zofran 4 Mg Tabs (Ondansetron hcl) 7)  Promethazine Hcl 25 Mg Tabs (Promethazine hcl) .[name] Kitchen.. 1 by mouth q4-6 hours as needed nausea and vomiting 8)  Imitrex 100 Mg Tabs (Sumatriptan succinate) .[name] Kitchen.. 1 tab at onset of migraine and repeat in 2 hours not to exceed more than 2 in a 24 hour period   Orders Added: 1)  Neurology Referral [Neuro] 2)  Est. Patient Level III [40981]

## 2010-09-19 NOTE — Consult Note (Signed)
Summary: [facility]  [facility]   Imported By: Maryln Gottron 03/27/2010 10:19:20  _____________________________________________________________________  External Attachment:    Type:   Image     Comment:   External Document

## 2010-09-19 NOTE — Letter (Signed)
Summary: [facility]  [facility]   Imported By: Maryln Gottron 12/25/2009 09:23:51  _____________________________________________________________________  External Attachment:    Type:   Image     Comment:   External Document

## 2010-09-19 NOTE — Letter (Signed)
Summary: [facility]  [facility]   Imported By: Maryln Gottron 01/02/2010 14:42:10  _____________________________________________________________________  External Attachment:    Type:   Image     Comment:   External Document

## 2010-09-19 NOTE — Progress Notes (Signed)
Summary: refills clonzaepam  Phone Note From Pharmacy   [facility]  [address] [phone]* Reason for Call: Needs renewal Details for Reason: Clonazepam 1mg  Summary of Call: Last filled on 05/04/10 #45 Initial call taken by: Romualdo Bolk, CMA Duncan Dull),  June 10, 2010 2:23 PM  Follow-up for Phone Call        her psychiatrist should be refilling her klonipen please help facilitate this Follow-up by: [name] MD,  June 10, 2010 6:18 PM  Additional Follow-up for Phone Call Additional follow up Details #1::        Left message for pt to call back. Faxed refill request sent back to pharmacy saying that pt needs to call office or sent to Dr. Corine Shelter in [facility]. Additional Follow-up by: Romualdo Bolk, CMA Duncan Dull),  June 11, 2010 12:45 PM    Additional Follow-up for Phone Call Additional follow up Details #2::    LMTOCB Romualdo Bolk, CMA (AAMA)  June 14, 2010 1:20 PM LMTOCB Romualdo Bolk, CMA Duncan Dull)  June 18, 2010 9:51 AM LMTOCB Romualdo Bolk, CMA Duncan Dull)  June 19, 2010 1:34 PM Spoke to pt and she did recieve the refll from Dr. Corine Shelter.  Follow-up by: Romualdo Bolk, CMA (AAMA),  June 19, 2010 3:51 PM

## 2010-09-19 NOTE — Assessment & Plan Note (Signed)
Summary: ?STREP/SSC   Vital Signs:  Patient profile:   50 year old female Menstrual status:  hysterectomy Weight:      142 pounds Temp:     98 degrees F oral BP sitting:   110 / 78  Vitals Entered By: Lynann Beaver CMA (November 01, 2009 9:49 AM) CC: sore throat, facial pain and sinus congestion Pain Assessment Patient in pain? yes     Location: head   History of Present Illness: [name] comesin today for   acute visit  Was in usual state of health until about 5 days when had  the  onset of fatigue and then   pain left face and eye area discomfort  and sore throat. No fever but glands swollen. Does nlong 12 shifts and sleep in day.   No sneezing cp sob . malaise and "feels awful" .  pztterns in persistent and progressive  and evlolving .  NO meds relief.  No change in psych meds . No new Gi CP signs and no facial rash.   Preventive Screening-Counseling & Management  Alcohol-Tobacco     Alcohol drinks/day: <1     Alcohol type: wine, beer     Smoking Status: quit     Year Quit: 1 week ago  Caffeine-Diet-Exercise     Caffeine use/day: 1     Does Patient Exercise: yes  Current Medications (verified): 1)  Klonopin 1 Mg  Tabs (Clonazepam) .[name] Kitchen.. 1 At Bedtime 2)  Estradiol 1 Mg  Tabs (Estradiol) .[name] Kitchen.. 1 Once Daily 3)  Lithium Carbonate 600 Mg Caps (Lithium Carbonate) .[name] Kitchen.. 1 By Mouth At Bedtime 4)  Lamictal 100 Mg Tabs (Lamotrigine) .[name] Kitchen.. 1 By Mouth Qam 5)  Vitamin D (Ergocalciferol) 50000 Unit Caps (Ergocalciferol) .... One Per Week.  Allergies (verified): No Known Drug Allergies  Past History:  Past medical, surgical, family and social histories (including risk factors) reviewed, and no changes noted (except as noted below).  Past Medical History: Reviewed history from 07/30/2009 and no changes required. bipolar disease Allergic rhinitis Headache hx endometriosis    Hosp  11 10 [facility] Consults [facility]-psych. Dr. Lavonia Drafts  Past Surgical  History: Reviewed history from 06/12/2008 and no changes required. Cholecystectomy Hysterectomy Oophorectomy    Family History: Reviewed history from 07/30/2009 and no changes required. mom  bronchiectesis    Father   OSA   Social History: Reviewed history from 06/18/2009 and no changes required. Occupation: nursing worked ortho trauma  [employer] Former Smoker       Review of Systems       The patient complains of headaches.  The patient denies fever, vision loss, decreased hearing, chest pain, prolonged cough, hemoptysis, abdominal pain, melena, hematochezia, hematuria, muscle weakness, transient blindness, difficulty walking, abnormal bleeding, enlarged lymph nodes, and angioedema.    Physical Exam  General:  mildly ill iin nad  Head:  normocephalic and atraumatic.   Eyes:  lef t eye mildy red with clear dc and eye lid slightly red  Ears:  R ear normal, L ear normal, and no external deformities.   Nose:  no external deformity and no external erythema.  mucoid dc  left maxilla tender Mouth:  mild erythema left more than right no lesion or edema Neck:  tender ac nodes  Lungs:  normal respiratory effort, no intercostal retractions, no accessory muscle use, and normal breath sounds.   Heart:  normal rate, regular rhythm, and no murmur.   Pulses:  nl cap refill  Neurologic:  alert & oriented X3.  grossly non focal Skin:  turgor normal, color normal, no rashes, no ecchymoses, and no petechiae.   Cervical Nodes:  no posterior cervical adenopathy.   Psych:  Oriented X3 and good eye contact.  tired  speech and cognition   Impression & Recommendations:  Problem # 1:  SINUSITIS - ACUTE-NOS (ICD-461.9)  empiric rs as above.  Expectant management and call with alarm signs  Her updated medication list for this problem includes:    Amoxicillin-pot Clavulanate 875-125 Mg Tabs (Amoxicillin-pot clavulanate) .[name] Kitchen... 1 by mouth two times a day for sinsutis  Orders: Prescription Created  Electronically [phone])  Problem # 2:  CONJUNCTIVITIS (ICD-372.30)  ? left assoicated with above. No cellilitis.   consider h flu adeno  Complete Medication List: 1)  Klonopin 1 Mg Tabs (Clonazepam) .[name] Kitchen.. 1 at bedtime 2)  Estradiol 1 Mg Tabs (Estradiol) .[name] Kitchen.. 1 once daily 3)  Lithium Carbonate 600 Mg Caps (Lithium carbonate) .[name] Kitchen.. 1 by mouth at bedtime 4)  Lamictal 100 Mg Tabs (Lamotrigine) .[name] Kitchen.. 1 by mouth qam 5)  Vitamin D (ergocalciferol) 50000 Unit Caps (Ergocalciferol) .... One per week. 6)  Amoxicillin-pot Clavulanate 875-125 Mg Tabs (Amoxicillin-pot clavulanate) .[name] Kitchen.. 1 by mouth two times a day for sinsutis  Other Orders: Rapid Strep (19147)  Patient Instructions: 1)  treatment.for sinusitis. 2)   Expect improvement in  3-5 days but may be congested  longer than this.  Prescriptions: AMOXICILLIN-POT CLAVULANATE 875-125 MG TABS (AMOXICILLIN-POT CLAVULANATE) 1 by mouth two times a day for sinsutis  #20 x 0   Entered and Authorized by:   [name] MD   Signed by:   [name] MD on 11/01/2009   Method used:   [facility]  [phone]* (retail)       [address] [place], [place]  [address]       Ph: [phone] or [phone]       Fax: [phone]   RxID:   [id number]   Laboratory Results  Date/Time Received: November 01, 2009 9:45 AM  Date/Time Reported: November 01, 2009 9:45 AM   Other Tests  Rapid Strep: negative Comments Wynona Canes, CMA  November 01, 2009 9:45 AM

## 2010-09-19 NOTE — Assessment & Plan Note (Signed)
Summary: ha/vomiting/njr   Vital Signs:  Patient profile:   50 year old female Menstrual status:  hysterectomy Height:      60 inches Weight:      138 pounds BMI:     27.05 Pulse rate:   72 / minute BP sitting:   100 / 60  (right arm) Cuff size:   regular  Vitals Entered By: Romualdo Bolk, CMA (AAMA) (May 17, 2010 2:09 PM) CC: Started on 9/27 with ha and vomiting. Pt has continues to have a bad ha and dizziness. No vomiting since 9/27. Pt has been taking vicodin and zofran.   History of Present Illness: [name] comes in today  for acute problem visit today .  Onset Sept 27th HA left side and then right  with  nausea and vomiting   . NO fever.  no hxof similar persistent  HAs .  no stiff neck fever or Loss of vision was at work and  then missed work this week.  no thunderclap HA .  Was achey perviously. Self rx with vicodan and  zofran.      laying around this week  Vision blurry no diplopia n some photo and phonophobia noted. Psych:  had been seeing  him every 1-2 months but havent seen in 3 months  cause doing well . Since last visit had Gi eval per Dr Loreta Ave dx  IBS signs and controlled usually.  Preventive Screening-Counseling & Management  Alcohol-Tobacco     Alcohol drinks/day: <1     Alcohol type: wine, beer     Smoking Status: quit     Year Quit: 1 week ago  Caffeine-Diet-Exercise     Caffeine use/day: 1     Does Patient Exercise: yes  Current Medications (verified): 1)  Klonopin 1 Mg  Tabs (Clonazepam) .[name] Kitchen.. 1 At Bedtime 2)  Estradiol 1 Mg  Tabs (Estradiol) .[name] Kitchen.. 1 Once Daily 3)  Lithium Carbonate 600 Mg Caps (Lithium Carbonate) .[name] Kitchen.. 1 By Mouth At Bedtime 4)  Lamictal 100 Mg Tabs (Lamotrigine) .[name] Kitchen.. 1 By Mouth Qam 5)  Vicodin 5-500 Mg Tabs (Hydrocodone-Acetaminophen) 6)  Zofran 4 Mg Tabs (Ondansetron Hcl)  Allergies (verified): No Known Drug Allergies  Past History:  Past Medical History: bipolar disease Allergic rhinitis Headache hx  endometriosis    Hosp  11 10 [facility] GI evaluation  for pain IBS dx  Consults [facility]. Dr. 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         IBS   using probiotic s and post Gb issues .   Physical Exam  General:  tired appearing non toxic in nad  cherent and nl speech  Head:  normocephalic and atraumatic.   Eyes:  PERRL, EOMs full, conjunctiva clear  Ears:  R ear normal and L ear normal.   Nose:  no external deformity and no nasal discharge.   face non tender Mouth:  good dentition and pharynx pink and moist.   Neck:  No deformities, masses, or tenderness noted. Lungs:  Normal respiratory  effort, chest expands symmetrically. Lungs are clear to auscultation, no crackles or wheezes. Heart:  Normal rate and regular rhythm. S1 and S2 normal without gallop, murmur, click, rub or other extra sounds. Abdomen:  Bowel sounds positive,abdomen soft and non-tender without masses, organomegaly or   noted. Extremities:  no clubbing cyanosis or edema  Neurologic:  non focal  oriented x 3  gait  ok slightly wide =based   nl strength gets up gingerly  no tremor  or unusual movement s Skin:  turgor normal, color normal, no ecchymoses, and no petechiae.   Cervical Nodes:  No lymphadenopathy noted Psych:  Oriented X3, good eye contact, not anxious appearing, and not depressed appearing.     Impression & Recommendations:  Problem # 1:   HEADACHE (ICD-784.0) acts like a migrain although pat says doesn get has has had 3 in lifetime that were severe and one needed ed  evaluation and iv mg and phenergan  to resolve .   No other alarm features such as fever focality.   will follow  and options discussed and use steroids instead of toradol because of the lithiuim  use.  Her updated medication list for this problem includes:    Vicodin 5-500 Mg Tabs (Hydrocodone-acetaminophen)  Orders: TLB-BMP (Basic Metabolic Panel-BMET) (80048-METABOL) TLB-CBC Platelet - w/Differential (85025-CBCD) T-Lithium Level (16[address]-60[address]) Venipuncture (09811) Specimen Handling (91478) Depo- Medrol 80mg  (J1040) Admin of Therapeutic Inj  intramuscular or subcutaneous (29562)  Problem # 2:  NAUSEA AND VOMITING (ICD-787.01) see above seems to be related to HA   Problem # 3:  ENCOUNTER FOR LONG-TERM USE OF OTHER MEDICATIONS (ICD-V58.69) lithium level to check also   Complete Medication List: 1)  Klonopin 1 Mg Tabs (Clonazepam) .[name] Kitchen.. 1 at bedtime 2)  Estradiol 1 Mg Tabs (Estradiol) .[name] Kitchen.. 1 once daily 3)  Lithium Carbonate 600 Mg Caps (Lithium carbonate) .[name] Kitchen.. 1 by mouth at bedtime 4)  Lamictal 100 Mg Tabs (Lamotrigine) .[name] Kitchen.. 1 by mouth qam 5)  Vicodin 5-500 Mg Tabs (Hydrocodone-acetaminophen) 6)  Zofran 4 Mg Tabs (Ondansetron hcl) 7)  Promethazine Hcl 25 Mg Tabs (Promethazine hcl) .[name] Kitchen.. 1 by mouth q4-6 hours as needed nausea and vomiting  Patient Instructions: 1)  You will be informed of lab results when available.  2)  This acts like a persistent migraine .  3)  if persistent or  progressive  we may need to do more work up. call the oncall service if getting a fever over 100.2  or  if needed over the weekend .    The saturday clinic is at [place] tomorrow am.  4)  call if  recurrent or not getting  better.  or worse Prescriptions: PROMETHAZINE HCL 25 MG TABS (PROMETHAZINE HCL) 1 by mouth q4-6 hours as needed nausea and vomiting  #20 x 0   Entered and  Authorized by:   [name] MD   Signed by:   [name] MD on 05/17/2010   Method used:   [facility]        [facility] [address] [phone]* (retail)       [address]       [place], [place]  [address]       Ph: [phone] or [phone]       Fax: [phone]   RxID:   [id number]    Medication Administration  Injection # 1:    Medication: Depo- Medrol 80mg     Diagnosis: HEADACHE (ICD-784.0)  Route: IM    Site: RUOQ gluteus    Exp Date: 11/16/2012    Lot #: [id number]    Mfr: Pharmacia    Comments: Gave 120mg     Patient tolerated injection without complications    Given by: Romualdo Bolk, CMA (AAMA) (May 17, 2010 3:06 PM)  Orders Added: 1)  TLB-BMP (Basic Metabolic Panel-BMET) [80048-METABOL] 2)  TLB-CBC Platelet - w/Differential [85025-CBCD] 3)  T-Lithium Level [80178-23440] 4)  Venipuncture [04540] 5)  Specimen Handling [99000] 6)  Depo- Medrol 80mg  [J1040] 7)  Admin of Therapeutic Inj  intramuscular or subcutaneous [96372] 8)  Est. Patient Level IV [98119]

## 2010-09-19 NOTE — Progress Notes (Signed)
Summary: sore throat  Needs appt time.  Phone Note Call from Patient   Caller: Patient Call For: [name] MD Summary of Call: Pt calls with sore throat and extreme fatigue.  States she HAS to be seen, but cannot wait until this afternoon.  Wants to come tomorrow morning first appt.  Please call pt and let her know what you can do. [phone] Initial call taken by: [facility] CMA,  October 31, 2009 9:01 AM  Follow-up for Phone Call        Appt made for tomorrow. Follow-up by: Romualdo Bolk, CMA (AAMA),  October 31, 2009 9:37 AM

## 2010-09-19 NOTE — Progress Notes (Signed)
Summary: INFO ONLY  Phone Note Call from Patient   Caller: Patient    [phone] Summary of Call: INFO ONLY-----Pt states she saw neurologist yesterday and signs of MS were not present..... adv neuro will be doing some labwork and results should be sent to Dr Rodena Medin once available.... Any questions please contact pt at   [phone].  Initial call taken by: Debbra Riding,  September 13, 2010 11:41 AM

## 2010-09-19 NOTE — Progress Notes (Signed)
Summary: ext fatigue & weakness & sore throat  Phone Note Call from Patient   Summary of Call: Discuss symptoms: Feels like arms & legs will give out, extreme fatigue and weakness.  Swollen glands throat & sore throat.  Throat is not red.  Ears tight.  No fever.  Chills.  Has worked 12 hour shifts 6 of last 8 days.  Will try more rest, clear liquids, OTC meds, ov as needed.  Initial call taken by: Rudy Jew, RN,  October 29, 2009 3:06 PM

## 2010-09-19 NOTE — Procedures (Signed)
Summary: Upper Endoscopy Report/[facility]  Upper Endoscopy Report/[facility]   Imported By: Maryln Gottron 03/27/2010 10:21:06  _____________________________________________________________________  External Attachment:    Type:   Image     Comment:   External Document

## 2010-09-19 NOTE — Progress Notes (Signed)
Summary: abdominal pain  Phone Note Call from Patient   Caller: Patient Call For: [name] Headings MD Summary of Call: [phone] Pt went to GI with abdominal pain, and was told it was IBS, but she does not believe it.  Is still having severe pain and wants to see Dr.Panosh ASAP.  Initial call taken by: Lynann Beaver CMA,  February 26, 2010 1:56 PM  Follow-up for Phone Call        i need the notes of all evaluations   to be able to help her decide what to do. Gi, Urology and the most recent GYNE  check she has had.  Also she shoulod ask her psych  if her meds are  at the right dose and if they could be causing her problems .  Follow-up by: [name] Headings MD,  February 26, 2010 5:40 PM  Additional Follow-up for Phone Call Additional follow up Details #1::        Pt aware and will call her psych. She states that she does need to see psych because she hasn't had her meds changed in awhile. She will also get the ov notes faxed to [place]. Additional Follow-up by: Romualdo Bolk, CMA Duncan Dull),  February 26, 2010 5:45 PM     Appended Document: abdominal pain Tell patient that that I recieved and  finally reviewed some of her records. I dont have other suggestions  for her abdominal pain except to follow up with GI managment.  at this time .   She si welcome to schedule an OV to discuss  this as needed.   Appended Document: abdominal pain Left message on machine about this.

## 2010-09-19 NOTE — Letter (Signed)
Summary: [facility]-Psychiatry   Imported By: Maryln Gottron 02/14/2010 13:19:30  _____________________________________________________________________  External Attachment:    Type:   Image     Comment:   External Document

## 2010-09-19 NOTE — Progress Notes (Signed)
Summary: Call-A-Nurse Report    [facility]-A-Nurse Triage Call Report Triage Record Num: [id number] Operator: April Finney Patient Name: [facility] Call Date & Time: 02/21/2010 6:03:24PM Patient Phone: [phone] PCP: Patient Gender: Female PCP Fax : Patient DOB: Sep 21, 1960 Practice Name: [facility] Reason for Call: [place] calling about diarrhea and severe abdominal pain. Diarrhea for a month and pain over a week. No chest pain in the past 24 hrs. Afebrile. No s/sx of bleeding. Urinated in the past 8hrs. Having spells of weakness and dizziness. See in 4 hrs care advice given. Protocol(s) Used: Abdominal Pain / Discomfort Recommended Outcome per Protocol: See Provider within 4 hours Reason for Outcome: Signs of dehydration Care Advice: Call EMS 911 if signs and symptoms of shock develop (such as unable to stand due to faintness, dizziness, or lightheadedness; new onset of confusion; slow to respond or difficult to awaken; skin is pale, gray, cool, or moist to touch; severe weakness; loss of consciousness).  ~  ~ Call provider immediately if having severe abdominal pain, abdominal swelling, or bloody diarrhea. Change position slowly. Sit for a couple of minutes before standing to walk. Quick position changes may cause or worsen symptoms.  ~ Drink clear liquids such as water or ginger ale, but do not eat solid food before seeing provider. If nausea or vomiting present, take sips of clear liquids.  ~  ~ SYMPTOM / CONDITION MANAGEMENT  ~ CAUTIONS 02/21/2010 6:23:22PM Page 1 of 1 [id number]

## 2010-09-20 NOTE — Letter (Signed)
Summary: Internal Correspondence  Internal Correspondence   Imported By: Everrett Coombe 02/25/2010 08:55:53  _____________________________________________________________________  External Attachment:    Type:   Image     Comment:   External Document

## 2010-09-23 ENCOUNTER — Other Ambulatory Visit: Payer: Self-pay | Admitting: Internal Medicine

## 2010-09-23 DIAGNOSIS — M5416 Radiculopathy, lumbar region: Secondary | ICD-10-CM

## 2010-09-25 NOTE — Assessment & Plan Note (Signed)
Summary: fup fatigued//ccm   Vital Signs:  Patient profile:   50 year old female Menstrual status:  hysterectomy Weight:      133 pounds Pulse rate:   88 / minute BP sitting:   112 / 72  (left arm)  Vitals Entered By: Kyung Rudd, CMA (September 16, 2010 3:12 PM) CC: pt f/u...fatigue. Pt states, "I feel like I'm going to pass out"...loss of appetite, Depression   CC:  pt f/u...fatigue. Pt states, "I feel like I'm going to pass out"...loss of appetite, and Depression.  History of Present Illness:  patient presents to clinic for followup of fatigue and possible lumbar radiculopathy. has been seen by neurology. Records currently unavailable. States laboratory evaluation pending including sedimentation rate, thyroid function, vitamin B12 level, metal screening , HIV and ACE level. did not undergo lumbar sacral imaging as previously discussed. Has had cranial cervical and thoracic MRI. Continues to have low back pain radiating anteriorly down both legs with intermittent subjective weakness. Also has intermittent numbness of anterior legs in similar distribution. No injury or trauma. Complains of fatigue constipation and unintended weight loss.  weight stable from last visit.  mentions some tenderness in the left axillary area. is following up with GI regarding constipation and hyperactive bowel sounds. States has undergone EGD and colonoscopy in the past. sees psychiatry for bipolar which she feels is well controlled currently. lithium level reportedly normal Ring recent hospitalization. headaches resolved since last visit.  Allergies: No Known Drug Allergies  Past History:  Past medical, surgical, family and social histories (including risk factors) reviewed for relevance to current acute and chronic problems.  Past Medical History: Reviewed history from 06/21/2010 and no changes required. bipolar disease Allergic rhinitis Headache hx endometriosis    total hysterectomy  Hosp  11 10  [facility] GI evaluation  for pain IBS dx  Consults [facility]. Dr. Lavonia Drafts  Past Surgical History: Reviewed history from 06/12/2008 and no changes required. Cholecystectomy Hysterectomy Oophorectomy    Family History: Reviewed history from 06/21/2010 and no changes required. mom  bronchiectesis    Father   OSA  Sis with HAs hormonally  mediated  Social History: Reviewed history from 05/17/2010 and no changes required. 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 No apparent alopecia or balding. Eyes:  pupils equal, pupils round, pupils reactive to light, pupils react to accomodation, corneas and lenses clear, and no injection.   Ears:  no external deformities.   Nose:  no external deformity.   Neurologic:  alert & oriented X3, cranial nerves II-XII intact, strength normal in all extremities, and gait normal.   Psych:  Oriented X3, memory intact for recent and remote, normally interactive, good eye contact, not anxious appearing, and not depressed appearing.     Impression & Recommendations:  Problem # 1:  LUMBAR RADICULOPATHY (ICD-724.4) Assessment Unchanged  schedule lumbar sacral MRI for further evaluation.   Orders: Radiology Referral (Radiology)  Problem # 2:  MUSCLE WEAKNESS (GENERALIZED) (ICD-728.87) Assessment: Unchanged   obtain CBC Chem-7 liver function test urinalysis CK enzyme and ANA. other laboratory evaluation pending from neurology.  Complete Medication List: 1)  Klonopin 1 Mg Tabs (Clonazepam) .[name] Kitchen.. 1 at bedtime 2)  Estradiol 1 Mg Tabs (Estradiol) .[name] KitchenMarland KitchenMarland Kitchen 1  once daily 3)  Lithium Carbonate 600 Mg Caps (Lithium carbonate) .[name] Kitchen.. 1 by mouth at bedtime 4)  Lamictal 200 Mg Tabs  (Lamotrigine) .[name] Kitchen.. 1 by mouth at bedtime 5)  Vicodin 5-500 Mg Tabs (Hydrocodone-acetaminophen) 6)  Zofran 4 Mg Tabs (Ondansetron hcl) 7)  Promethazine Hcl 25 Mg Tabs (Promethazine hcl) .[name] Kitchen.. 1 by mouth q4-6 hours as needed nausea and vomiting 8)  Imitrex 100 Mg Tabs (Sumatriptan succinate) .[name] Kitchen.. 1 tab at onset of migraine and repeat in 2 hours not to exceed more than 2 in a 24 hour period  Other Orders: TLB-CBC Platelet - w/Differential (85025-CBCD) TLB-BMP (Basic Metabolic Panel-BMET) (80048-METABOL) UA Dipstick w/o Micro (automated)  (81003) TLB-Hepatic/Liver Function Pnl (80076-HEPATIC) TLB-Iron, (Fe) Total (83540-FE) TLB-CK Total Only(Creatine Kinase/CPK) (82550-CK) T-ANA (95621-30865) Specimen Handling (78469) Venipuncture (62952)    Orders Added: 1)  Radiology Referral [Radiology] 2)  TLB-CBC Platelet - w/Differential [85025-CBCD] 3)  TLB-BMP (Basic Metabolic Panel-BMET) [80048-METABOL] 4)  UA Dipstick w/o Micro (automated)  [81003] 5)  TLB-Hepatic/Liver Function Pnl [80076-HEPATIC] 6)  TLB-Iron, (Fe) Total [83540-FE] 7)  TLB-CK Total Only(Creatine Kinase/CPK) [82550-CK] 8)  T-ANA [84132-44010] 9)  Specimen Handling [99000] 10)  Venipuncture [36415] 11)  Est. Patient Level IV [27253]    Laboratory Results   Urine Tests  Date/Time Recieved: September 16, 2010 4:29 PM  Date/Time Reported: September 16, 2010 4:29 PM   Routine Urinalysis   Color: yellow Appearance: Clear Glucose: negative   (Normal Range: Negative) Bilirubin: negative   (Normal Range: Negative) Ketone: negative   (Normal Range: Negative) Spec. Gravity: <1.005   (Normal Range: 1.003-1.035) Blood: negative   (Normal Range: Negative) pH: 5.5   (Normal Range: 5.0-8.0) Protein: negative   (Normal Range: Negative) Urobilinogen: 0.2   (Normal Range: 0-1) Nitrite: negative   (Normal Range: Negative) Leukocyte Esterace: negative   (Normal Range: Negative)    Comments: Wynona Canes, CMA  September 16, 2010 4:29 PM

## 2010-09-25 NOTE — Progress Notes (Signed)
----   Converted from flag ---- ---- 09/17/2010 2:43 PM, Edwyna Perfect MD wrote: labs nl.  (showed up under the wrong provider again) ------------------------------  Phone Note Outgoing Call   Call placed by: Kyung Rudd, CMA,  September 17, 2010 4:28 PM Call placed to: Patient Details for Reason: lab results Summary of Call: pt aware Initial call taken by: Kyung Rudd, CMA,  September 17, 2010 4:28 PM

## 2010-09-27 ENCOUNTER — Ambulatory Visit
Admission: RE | Admit: 2010-09-27 | Discharge: 2010-09-27 | Disposition: A | Discharge status: Home / Self Care | Payer: BC Managed Care – PPO | Source: Ambulatory Visit | Attending: Internal Medicine | Admitting: Internal Medicine

## 2010-09-27 DIAGNOSIS — M5416 Radiculopathy, lumbar region: Secondary | ICD-10-CM

## 2010-10-01 ENCOUNTER — Telehealth: Payer: Self-pay

## 2010-10-01 ENCOUNTER — Other Ambulatory Visit: Payer: Self-pay | Admitting: Internal Medicine

## 2010-10-01 NOTE — Telephone Encounter (Signed)
Message copied by Kyung Rudd on Tue Oct 01, 2010  1:30 PM ------      Message from: Letitia Libra, Maisie Fus      Created: Tue Oct 01, 2010 12:27 PM       ls mri does show a disc protrusion at l4-l5 but i'm not fully convinced this is the source of her sx. Ok to fax to neurology

## 2010-10-01 NOTE — Telephone Encounter (Signed)
Pt aware and fax sent to Dr. Venia Minks. Pt states that her GI doctor, Dr. Despina Pole, has been trying to reach Dr. Rodena Medin to discuss her issues because pt and/or doctor thinks that pt's sx are affecting her digestive system

## 2010-10-02 ENCOUNTER — Ambulatory Visit: Payer: BC Managed Care – PPO | Admitting: Internal Medicine

## 2010-10-03 ENCOUNTER — Encounter: Payer: Self-pay | Admitting: Internal Medicine

## 2010-10-03 ENCOUNTER — Ambulatory Visit (INDEPENDENT_AMBULATORY_CARE_PROVIDER_SITE_OTHER): Payer: BC Managed Care – PPO | Admitting: Internal Medicine

## 2010-10-03 DIAGNOSIS — F319 Bipolar disorder, unspecified: Secondary | ICD-10-CM

## 2010-10-03 DIAGNOSIS — M6281 Muscle weakness (generalized): Secondary | ICD-10-CM

## 2010-10-03 NOTE — Assessment & Plan Note (Signed)
Recommend f/u appt with psychiatry and pt agrees.

## 2010-10-03 NOTE — Progress Notes (Signed)
  Subjective:    Patient ID: [name], female    DOB: 1961-02-09, 50 y.o.   MRN: [id number]  HPI Pt presents to clinic to discuss leg weakness and for paperwork completion. Has ongoing intermittent LE weakness with recent lumbar MRI reviewed. +disc protrusion and degenerative changes providing mild stenosis. Also being evaluated by GI for constipation and wt loss. Does suffer from bipolar d/o followed by psychiatry last seen ~1.5 months ago. C/o fatigue have been evaluated with labwork including chem7, lft, ck, iron, ANA, CBC and outside labs reportedly to include b12, metal screen, tsh, HIV and ace level. Requests fmla paperwork to be completed due to continued intermittent work absences from sx's.  Reviewed PMH, medications, and allergies    Review of Systems See HPI     Objective:   Physical Exam  Constitutional: She appears well-developed and well-nourished. No distress.  Neurological: She is alert. Gait normal.  Skin: She is not diaphoretic.  Psychiatric: She has a normal mood and affect. Her behavior is normal.          Assessment & Plan:

## 2010-10-03 NOTE — Assessment & Plan Note (Addendum)
Pt proceeded with neurology second opinion appt pending. Provided with copy of recent LS MRI. FMLA paperwork completed for intermittent work absences due to sx's.

## 2010-10-09 ENCOUNTER — Emergency Department (HOSPITAL_COMMUNITY): Payer: BC Managed Care – PPO

## 2010-10-09 ENCOUNTER — Emergency Department (HOSPITAL_COMMUNITY)
Admission: EM | Admit: 2010-10-09 | Discharge: 2010-10-09 | Disposition: A | Discharge status: Home / Self Care | Payer: BC Managed Care – PPO | Attending: Emergency Medicine | Admitting: Emergency Medicine

## 2010-10-09 DIAGNOSIS — K3184 Gastroparesis: Secondary | ICD-10-CM | POA: Insufficient documentation

## 2010-10-09 DIAGNOSIS — K5900 Constipation, unspecified: Secondary | ICD-10-CM | POA: Insufficient documentation

## 2010-10-09 DIAGNOSIS — R110 Nausea: Secondary | ICD-10-CM | POA: Insufficient documentation

## 2010-10-09 DIAGNOSIS — F319 Bipolar disorder, unspecified: Secondary | ICD-10-CM | POA: Insufficient documentation

## 2010-10-09 DIAGNOSIS — R109 Unspecified abdominal pain: Secondary | ICD-10-CM | POA: Insufficient documentation

## 2010-10-09 LAB — URINALYSIS, ROUTINE W REFLEX MICROSCOPIC
Urine Glucose, Fasting: NEGATIVE mg/dL
pH: 7.5 (ref 5.0–8.0)

## 2010-10-09 LAB — DIFFERENTIAL
Lymphocytes Relative: 36 % (ref 12–46)
Lymphs Abs: 2.2 10*3/uL (ref 0.7–4.0)
Neutrophils Relative %: 50 % (ref 43–77)

## 2010-10-09 LAB — COMPREHENSIVE METABOLIC PANEL
ALT: 14 U/L (ref 0–35)
AST: 15 U/L (ref 0–37)
Alkaline Phosphatase: 48 U/L (ref 39–117)
CO2: 23 mEq/L (ref 19–32)
Glucose, Bld: 80 mg/dL (ref 70–99)
Potassium: 4.2 mEq/L (ref 3.5–5.1)
Sodium: 140 mEq/L (ref 135–145)
Total Protein: 6 g/dL (ref 6.0–8.3)

## 2010-10-09 LAB — CBC
HCT: 38.2 % (ref 36.0–46.0)
MCV: 91.6 fL (ref 78.0–100.0)
Platelets: 289 10*3/uL (ref 150–400)
RBC: 4.17 MIL/uL (ref 3.87–5.11)
WBC: 6 10*3/uL (ref 4.0–10.5)

## 2010-10-12 ENCOUNTER — Telehealth: Payer: Self-pay | Admitting: Internal Medicine

## 2010-10-12 ENCOUNTER — Emergency Department (HOSPITAL_COMMUNITY): Payer: BC Managed Care – PPO

## 2010-10-12 ENCOUNTER — Emergency Department (HOSPITAL_COMMUNITY)
Admission: EM | Admit: 2010-10-12 | Discharge: 2010-10-13 | Disposition: A | Discharge status: Other Acute Inpatient Hospital | Payer: BC Managed Care – PPO | Source: Home / Self Care | Attending: Emergency Medicine | Admitting: Emergency Medicine

## 2010-10-12 DIAGNOSIS — R5381 Other malaise: Secondary | ICD-10-CM | POA: Insufficient documentation

## 2010-10-12 DIAGNOSIS — R110 Nausea: Secondary | ICD-10-CM | POA: Insufficient documentation

## 2010-10-12 DIAGNOSIS — R5383 Other fatigue: Secondary | ICD-10-CM | POA: Insufficient documentation

## 2010-10-12 DIAGNOSIS — R209 Unspecified disturbances of skin sensation: Secondary | ICD-10-CM | POA: Insufficient documentation

## 2010-10-12 DIAGNOSIS — R51 Headache: Secondary | ICD-10-CM | POA: Insufficient documentation

## 2010-10-12 LAB — URINALYSIS, ROUTINE W REFLEX MICROSCOPIC
Bilirubin Urine: NEGATIVE
Hgb urine dipstick: NEGATIVE
Ketones, ur: NEGATIVE mg/dL
Nitrite: NEGATIVE
pH: 8 (ref 5.0–8.0)

## 2010-10-12 LAB — CK TOTAL AND CKMB (NOT AT ARMC)
Relative Index: 2.8 — ABNORMAL HIGH (ref 0.0–2.5)
Total CK: 121 U/L (ref 7–177)

## 2010-10-12 LAB — CBC
HCT: 43 % (ref 36.0–46.0)
Hemoglobin: 14.7 g/dL (ref 12.0–15.0)
MCH: 31.1 pg (ref 26.0–34.0)
MCHC: 34.2 g/dL (ref 30.0–36.0)
MCV: 91.1 fL (ref 78.0–100.0)
RDW: 12.1 % (ref 11.5–15.5)

## 2010-10-12 LAB — DIFFERENTIAL
Eosinophils Relative: 2 % (ref 0–5)
Lymphocytes Relative: 28 % (ref 12–46)
Lymphs Abs: 2.1 10*3/uL (ref 0.7–4.0)
Monocytes Absolute: 0.6 10*3/uL (ref 0.1–1.0)
Monocytes Relative: 8 % (ref 3–12)

## 2010-10-12 LAB — COMPREHENSIVE METABOLIC PANEL
CO2: 24 mEq/L (ref 19–32)
Calcium: 9.3 mg/dL (ref 8.4–10.5)
Chloride: 105 mEq/L (ref 96–112)
Creatinine, Ser: 0.76 mg/dL (ref 0.4–1.2)
GFR calc non Af Amer: >60 mL/min (ref >60)
Glucose, Bld: 101 mg/dL — ABNORMAL HIGH (ref 70–99)
Total Bilirubin: 0.5 mg/dL (ref 0.3–1.2)

## 2010-10-12 LAB — LIPASE, BLOOD: Lipase: 23 U/L (ref 11–59)

## 2010-10-12 LAB — GLUCOSE, CAPILLARY: Glucose-Capillary: 108 mg/dL — ABNORMAL HIGH (ref 70–99)

## 2010-10-13 ENCOUNTER — Observation Stay (HOSPITAL_COMMUNITY)
Admission: EM | Admit: 2010-10-13 | Discharge: 2010-10-15 | Disposition: A | Discharge status: Home / Self Care | Payer: BC Managed Care – PPO | Source: Other Acute Inpatient Hospital | Attending: Internal Medicine | Admitting: Internal Medicine

## 2010-10-13 ENCOUNTER — Inpatient Hospital Stay: Admit: 2010-10-13 | Payer: Self-pay | Admitting: Internal Medicine

## 2010-10-13 DIAGNOSIS — F319 Bipolar disorder, unspecified: Secondary | ICD-10-CM | POA: Insufficient documentation

## 2010-10-13 DIAGNOSIS — R5381 Other malaise: Principal | ICD-10-CM | POA: Insufficient documentation

## 2010-10-13 DIAGNOSIS — R51 Headache: Secondary | ICD-10-CM | POA: Insufficient documentation

## 2010-10-13 DIAGNOSIS — R0789 Other chest pain: Secondary | ICD-10-CM | POA: Insufficient documentation

## 2010-10-13 DIAGNOSIS — K5900 Constipation, unspecified: Secondary | ICD-10-CM | POA: Insufficient documentation

## 2010-10-13 DIAGNOSIS — R072 Precordial pain: Secondary | ICD-10-CM

## 2010-10-13 DIAGNOSIS — H53149 Visual discomfort, unspecified: Secondary | ICD-10-CM | POA: Insufficient documentation

## 2010-10-13 HISTORY — PX: TRANSTHORACIC ECHOCARDIOGRAM: SHX275

## 2010-10-13 LAB — RAPID URINE DRUG SCREEN, HOSP PERFORMED
Amphetamines: NOT DETECTED
Barbiturates: NOT DETECTED
Cocaine: NOT DETECTED
Opiates: NOT DETECTED
Tetrahydrocannabinol: NOT DETECTED

## 2010-10-13 LAB — IRON AND TIBC
Iron: 77 ug/dL (ref 42–135)
Saturation Ratios: 23 % (ref 20–55)

## 2010-10-13 LAB — MAGNESIUM: Magnesium: 2.2 mg/dL (ref 1.5–2.5)

## 2010-10-13 LAB — CK TOTAL AND CKMB (NOT AT ARMC): CK, MB: 2.4 ng/mL (ref 0.3–4.0)

## 2010-10-13 LAB — PROTIME-INR
INR: 1.05 (ref 0.00–1.49)
Prothrombin Time: 13.9 seconds (ref 11.6–15.2)

## 2010-10-13 LAB — SEDIMENTATION RATE: Sed Rate: 1 mm/hr (ref 0–22)

## 2010-10-13 LAB — MONONUCLEOSIS SCREEN: Mono Screen: NEGATIVE

## 2010-10-13 LAB — T4, FREE: Free T4: 1.12 ng/dL (ref 0.80–1.80)

## 2010-10-13 LAB — APTT: aPTT: 32 seconds (ref 24–37)

## 2010-10-14 ENCOUNTER — Telehealth: Payer: Self-pay | Admitting: *Deleted

## 2010-10-14 LAB — COMPREHENSIVE METABOLIC PANEL
Alkaline Phosphatase: 44 U/L (ref 39–117)
BUN: 9 mg/dL (ref 6–23)
CO2: 26 mEq/L (ref 19–32)
Chloride: 111 mEq/L (ref 96–112)
GFR calc non Af Amer: >60 mL/min (ref >60)
Glucose, Bld: 89 mg/dL (ref 70–99)
Potassium: 4.3 mEq/L (ref 3.5–5.1)
Total Bilirubin: 0.6 mg/dL (ref 0.3–1.2)

## 2010-10-14 LAB — CBC
HCT: 37.3 % (ref 36.0–46.0)
MCH: 29.7 pg (ref 26.0–34.0)
MCV: 94 fL (ref 78.0–100.0)
RBC: 3.97 MIL/uL (ref 3.87–5.11)
WBC: 6.5 10*3/uL (ref 4.0–10.5)

## 2010-10-14 LAB — CARDIAC PANEL(CRET KIN+CKTOT+MB+TROPI)
Relative Index: INVALID (ref 0.0–2.5)
Troponin I: 0.01 ng/mL (ref 0.00–0.06)

## 2010-10-14 LAB — ANA: Anti Nuclear Antibody(ANA): NEGATIVE

## 2010-10-14 NOTE — H&P (Signed)
NAMEMERSADEZ, LINDEN              ACCOUNT NO.:  [id number]  MEDICAL RECORD NO.:  [id number]           PATIENT TYPE:  E  LOCATION:  [facility]                         FACILITY:  [facility]  PHYSICIAN:  Eduard Clos, MDDATE OF BIRTH:  [birth year]  DATE OF ADMISSION:  10/12/2010 DATE OF DISCHARGE:                             HISTORY & PHYSICAL   PRIMARY CARE PHYSICIAN:  At [place] at [place].  CHIEF COMPLAINT:  Generalized weakness.  HISTORY OF PRESENT ILLNESS:  This is a 50 year old female with known history of bipolar disorder who has been experiencing generalized weakness over the last 2 months who has experienced more generalized weakness than usual and decided to come to the ER.  In addition, the patient says she has been having some chest pressure over the last 2-3 days and she has been worked up for possible multiple sclerosis by her neurologist at [facility] and she was advised that she may need a lumbar puncture, which will evoke studies.  The patient did have MRI of the brain, C-spine, and T-spine at [facility] in December which were all negative, records of which have been already faxed and is in the chart.  The patient's chest pain is pressure like, nonradiating.  There is no relation to exertion.  There is no associated shortness of breath.  She denies any cough, phlegm. Denies any fever or chills.  Denies any abdominal pain, dysuria, discharge, or diarrhea.  Denies any focal deficit.  The patient's generalized weakness is nonfocal, has not lost consciousness, and she has associated head pressure which has been constant over the last 2 months with increasing photophobia for the last 3-4 days with no fever, chills, or any neck rigidity.  PAST HISTORY:  Bipolar disorder.  PAST SURGICAL HISTORY:  Hysterectomy and oophorectomy for endometriosis when she was 31.  Has also had appendectomy.  MEDICATIONS ON ADMISSION: 1. Lithium 600 mg at bedtime. 2. Lamictal  200 mg daily. 3. Klonopin p.r.n. at bedtime.  ALLERGIES:  REGLAN.  FAMILY HISTORY:  Grandmother had bipolar disorder.  SOCIAL HISTORY:  The patient denies smoking cigarettes, drinks alcohol occasionally.  Denies any drug abuse.  Works as a Engineer, civil (consulting).  REVIEW OF SYSTEMS:  Per history of present illness, nothing else significant.  PHYSICAL EXAMINATION:  GENERAL:  The patient is examined at bedside, not in acute distress. VITAL SIGNS:  Blood pressure is 119/70, pulse 97 per minute, temperature 98.4, respirations 18 per minute, and O2 sat 98%. HEENT:  Anicteric.  No pallor.  No facial asymmetry.  Tongue is midline. PERRLA positive.  No neck rigidity.  No discharge from ears, eyes, nose, or mouth. CHEST:  Bilateral air entry present.  No rhonchi.  No crepitation. HEART:  S1 and S2 heard. ABDOMEN:  Soft and nontender.  Bowel sounds heard. CENTRAL NERVOUS SYSTEM:  Alert, awake, and oriented to time, place, and person.  Moves upper and lower extremities.  Reflexes are brisk.  There is no focal deficit. EXTREMITIES:  Peripheral pulses felt.  No edema.  LABORATORY DATA:  EKG shows normal sinus rhythm with heart rate around 84 beats per minute with nonspecific ST-T changes.  CT of the head without contrast shows stable normal noncontrast CT appearance of brain and please check with the chart and the patient's MRI reports which was done at [facility] in December 2011 which all did not show anything acute.  CBC:  WBC 7.5, hemoglobin is 14.7, hematocrit 43, and platelets 312. Complete metabolic panel:  Sodium 136, potassium 4.2, chloride 105, carbon dioxide 24, glucose 101, BUN 13, creatinine 0.7, total bilirubin is 4.5, alkaline phos 59, AST 23, ALT 15, total protein 7.2, albumin 4.2, calcium 9.3.  Lipase 23.  CK-MB 3.4, CK 121, relative index 2.8. Troponin I less than 0.01.  Lithium 0.34.  UA is negative.  ASSESSMENT: 1. Generalized weakness, unclear etiology. 2. Bipolar disorder,  has no suicidal ideation. 3. Chest discomfort. 4. Headache with photophobia.  PLAN: 1. At this time admit the patient to telemetry. 2. For her chest discomfort, we will cycle cardiac markers, get 2-D     echo, p.r.n. nitroglycerin.  Will be placed on aspirin.  Check a D-     dimer if possible.  We will get CT angio of the chest. 3. Generalized weakness.  At this time, we do not know the clear     cause.  We will gently hydrate her.  We will get PT/OT consult as     the patient is also complaining of some head pressure and     photophobia.  At this time, the patient has no definite clinical     stigmata for meningitis.  We will get a neurology consult later in     the day and follow their recommendations. 4. Further recommendation as condition evolves.     Eduard Clos, MD    ANK/MEDQ  D:  10/13/2010  T:  10/13/2010  Job:  [id number]  cc:   Physicians at [employer] at [facility]  Electronically Signed by Midge Minium MD on 10/14/2010 05:08:16 PM

## 2010-10-14 NOTE — Telephone Encounter (Signed)
Pt called to let Dr Ilda Foil  Know that she is in the hospital in [place] for neurological problems, and is not happy with her treatment.   Wanted [place] to know.

## 2010-10-14 NOTE — Telephone Encounter (Signed)
[facility] Triage Call Report Triage Record Num: [id number] Operator: Frederico Hamman Patient Name: [facility] Call Date & Time: 10/12/2010 5:30:05PM Patient Phone: [phone] PCP: Neta Mends. Panosh Patient Gender: Female PCP Fax : [phone] Patient DOB: May 04, 1961 Practice Name: [facility] Reason for Call: Luciann states she is shaking and has severe weakness.Pressure in her head. Was diagnosed with paralytic bowel 6 weeks ago. Is driving car on way to work. Feels like she is going to pass out with severe weakness.No history of hypoglycemia. Advised to pull over and stop driving.States she was seen in [facility] ED this week and diagnosed with constipation. Using profanity. Advised to hang up and call 911. States if she goes to [facility] ED she expects to be seen by a MD from [place]. Demanding to speak with on call MD. Dr Alphonsus Sias notified- unable to conference [facility] and Dr. Alphonsus Sias. Dr. Alphonsus Sias to call [facility] directly @ [phone]. Care advice given. Sees neurologist for paralytic bowel x 6 weeks. Was seen 2/22 night in [facility] ED. Was diagnosed constipation. Protocol(s) Used: Fainting Recommended Outcome per Protocol: Activate EMS 911 Reason for Outcome: New or worsening signs and symptoms that may indicate shock Care Advice: ~ 10/12/2010 5:51:38PM Page 1 of 1 [id number]

## 2010-10-15 NOTE — Progress Notes (Addendum)
  Phone Note Other Incoming   Caller: call a nurse Summary of Call: patient has dizziness and other complaints  demands to speak to a [place] physician If to ER DEMANDS to be seen by Corinda Gubler MD Initial call taken by: Cindee Salt MD,  October 12, 2010 5:42 PM  Follow-up for Phone Call        States she is about to pass out Instructed her to call 911 unless someone could bring her to the ER ASks for me to admit her to the hospital-- "I was there 3 days ago and they didn't take me seriously" I discussed the hospitalist system and the ER She thanked me for calling Follow-up by: Cindee Salt MD,  October 12, 2010 6:03 PM

## 2010-10-17 ENCOUNTER — Ambulatory Visit (INDEPENDENT_AMBULATORY_CARE_PROVIDER_SITE_OTHER): Payer: BC Managed Care – PPO | Admitting: Internal Medicine

## 2010-10-17 ENCOUNTER — Encounter: Payer: Self-pay | Admitting: Internal Medicine

## 2010-10-17 DIAGNOSIS — G8929 Other chronic pain: Secondary | ICD-10-CM

## 2010-10-17 DIAGNOSIS — F319 Bipolar disorder, unspecified: Secondary | ICD-10-CM

## 2010-10-17 DIAGNOSIS — M6281 Muscle weakness (generalized): Secondary | ICD-10-CM

## 2010-10-17 DIAGNOSIS — R51 Headache: Secondary | ICD-10-CM

## 2010-10-17 DIAGNOSIS — K5900 Constipation, unspecified: Secondary | ICD-10-CM | POA: Insufficient documentation

## 2010-10-17 MED ORDER — LACTULOSE 10 GM/15ML PO SOLN: ORAL | Status: DC

## 2010-10-17 NOTE — Assessment & Plan Note (Signed)
Recommend f/u with psychiatry

## 2010-10-17 NOTE — Assessment & Plan Note (Signed)
Neurology consult [facility].

## 2010-10-17 NOTE — Progress Notes (Signed)
  Subjective:    Patient ID: [name], female    DOB: 1961-02-02, 50 y.o.   MRN: [id number]  HPI  Pt presents to clinic for hospital followup of weakness. Recently hospitalized for several days after worsening weakness and presyncope. Has continued daily head pressure and constipation. Workup to date has been extensive but unrevealing. Has undergone head mri, head ct, cervical/thoracic/lumbar mri and neurology consult. Additional recent labwork done during hospitalization includes cbc, chem7, lft, ck/troponin, mg, phos, esr, iron, complement, coags, ana and neg acetylcholine receptor antibody.  Constipation not signficantly helped by miralax.  Reviewed PMH, medications and allergies.    Review of Systems See HPI     Objective:   Physical Exam  Constitutional: She appears well-developed and well-nourished. No distress.  Neurological: She is alert. Gait normal.  Skin: Skin is warm and dry. She is not diaphoretic.          Assessment & Plan:

## 2010-10-18 ENCOUNTER — Telehealth: Payer: Self-pay | Admitting: *Deleted

## 2010-10-18 ENCOUNTER — Other Ambulatory Visit: Payer: Self-pay | Admitting: Obstetrics

## 2010-10-18 NOTE — Telephone Encounter (Signed)
Left message on pt's voice mail re: Dr. Ilda Foil recommendations

## 2010-10-18 NOTE — Telephone Encounter (Signed)
meclinzine is often available otc. Since she has the popping sensation wonder about fluid and her eustacian tubes. See if she'll take otc zyrtec for several days and if she'll let you call in flonase 2 sprays each nostril qhs #1

## 2010-10-18 NOTE — Telephone Encounter (Signed)
Pt has become dizzy again this afternoon and sometimes has a popping in her ears, and wondering if she could use some antivert??  Vertigo?

## 2010-10-19 NOTE — Consult Note (Signed)
[name], [name]              ACCOUNT NO.:  [id number]  MEDICAL RECORD NO.:  [id number]           PATIENT TYPE:  I  LOCATION:  1421                         FACILITY:  [facility]  PHYSICIAN:  Thana Farr, MD    DATE OF BIRTH:  [birth year]  DATE OF CONSULTATION:  10/13/2010 DATE OF DISCHARGE:                                CONSULTATION   Neurologic Consultation  CONSULT CALLED BY:  Dr. Antionette Char.  HISTORY:  [name] is a 50 year old female that describes a 32-month history of multiple complaints.  Her complaint of longest duration is that of generalized weakness.  It has been progressively worsening over the past 4 months.  She also describes headaches that are a pressure sensation and located on the top of her head.  She has episodes of blurry vision.  They last up to a couple of hours and resolved on their own.  She has had some episodes of numbness and tingling in her legs and feet as well.  Patient also describes constipation.  She reports that she is requiring doses of MiraLax approximately 6 times a day. Is now receiving IV fluids and taking p.o. fluids as well and does not feel that her urine output is adequate. She does not have incontinence.  The patient has been seen multiple times as an outpatient including by a neurologist.  In workup, she has had a Lyme titer performed that was unremarkable.  She has also had an MRI of the brain, cervical spine and thoracic spine.  There were some nonspecific white matter changes on her MRI of the brain and MS could not be conclusively ruled out.  Patient was to have a VER and LP this week. Her weakness was progressed into the point that she was less functional.  She felt as if she was going to pass out and was unable to go to work.  Patient presented and was admitted.  Consult was called for further evaluation.  PAST MEDICAL HISTORY: 1. Bipolar disease. 2. Status post hysterectomy.  SOCIAL HISTORY:  The patient is a  Engineer, civil (consulting) at [employer].  She has no history of alcohol, tobacco or illicit drug abuse.  MEDICATIONS ON ADMISSION: 1. Lithium. 2. Lamictal. 3. Klonopin. 4. MiraLax.  PHYSICAL EXAMINATION:  VITAL SIGNS:  Blood pressure 104/73, heart rate 66, respiratory rate 14, temperature 97.9. MENTAL STATUS TESTING:  Patient is alert and oriented.  She can follow commands without difficulty.  Speech is fluent.  Affect is flat.  On cranial nerve testing:  II:  Disk flat bilaterally.  Visual fields grossly intact.  III, IV, VI:  Extraocular muscles intact.  V, VII: Smile symmetric.  VIII:  Grossly intact.  IX and X:  Positive gag.  XI: Bilateral shoulder shrug.  XII:  Midline tongue extension.  On motor testing, the patient gives 5/5 strength throughout.  There is normal tone and bulk.  On sensory testing, pinprick and light touch are intact bilaterally.  Deep tendon reflexes are 3+ throughout.  Plantars are mute bilaterally.  On cerebellar testing, finger-to-nose and heel-to-shin intact.  LABORATORY DATA:  White blood cell count 7.5, platelet count  312,hemoglobin and hematocrit 14.7 and 43.0 respectively.  D-dimer less than 0.22.  Sodium 136, potassium 4.2, chloride 105, CO2 of 24, BUN 13, creatinine 0.76, glucose 101, total bili 0.5, alk phos 59, SGOT and SGPT 23 and 15, total protein 7.2, albumin 4.2, calcium 9.3, CK 98, TSH 1.021, lithium 0.34.  UA negative.  DIAGNOSTIC STUDIES:  CT of the head performed on admission was unremarkable.  ASSESSMENT:  [name] is a 50 year old female that presents with multiple complaints that include generalized weakness, intermittent blurred vision, headaches and constipation.  Etiology of her symptoms is unclear.  They have been present for 4 months.  Further workup is indicated.  The patient reports she was just unable to wait for this to be performed on an outpatient basis.  There are some things that remain in the differential such as multiple  autoimmune disorders including myasthenia gravis, sequelae of mononucleosis, electrolyte abnormalities, etc.,  With the patient's complaint of blurred vision, cannot rule out the possibility of pseudotumor cerebri and this does remain in the differential although it is less likely.  PLAN: 1. Serum magnesium, phosphorus, ANA, C3, C4, acetylcholine     receptor antibody, anti-musk antibody, ESR, PT, PTT, INR,     mono titer. 2. LP with opening pressure to be performed. 3. Increase MiraLax to 3 times a day.  Patient has not responded to     routine doses of the MiraLax as an outpatient.          ______________________________ Thana Farr, MD     LR/MEDQ  D:  10/13/2010  T:  10/13/2010  Job:  [id number]  Electronically Signed by Thana Farr MD on 10/19/2010 02:01:01 PM

## 2010-10-22 ENCOUNTER — Other Ambulatory Visit: Payer: Self-pay | Admitting: Obstetrics

## 2010-10-22 DIAGNOSIS — Z1231 Encounter for screening mammogram for malignant neoplasm of breast: Secondary | ICD-10-CM

## 2010-10-25 ENCOUNTER — Ambulatory Visit: Payer: BC Managed Care – PPO | Admitting: Internal Medicine

## 2010-10-25 LAB — MISCELLANEOUS TEST

## 2010-10-25 NOTE — Discharge Summary (Signed)
NAMEJOURNE, HALLMARK              ACCOUNT NO.:  [id number]  MEDICAL RECORD NO.:  [id number]           PATIENT TYPE:  I  LOCATION:  1308                         FACILITY:  [facility]  PHYSICIAN:  Ladell Pier, M.D.   DATE OF BIRTH:  [birth year]  DATE OF ADMISSION:  10/13/2010 DATE OF DISCHARGE:  10/15/2010                              DISCHARGE SUMMARY   PROBLEM LIST: 1. Generalized weakness. 2. Bipolar disorder. 3. Chest discomfort. 4. Head pressure with photophobia.  DISCHARGE MEDICATIONS: 1. Estradiol vaginal cream 1 mg daily at bedtime. 2. Klonopin 1 mg daily at bedtime. 3. Lamictal 200 mg every morning. 4. Lithium 600 mg at bedtime.  FOLLOWUP APPOINTMENTS:  The patient has an appointment to follow up next Thursday with Dr. Marjory Lies.  PROCEDURES: 1. CT scan of the head, stable normal contrast CT of the brain. 2. Abdominal x-ray, normal bowel gas pattern.  No free air.  No acute     cardiopulmonary disease.  CONSULTANTS: 1. Marlan Palau, M.D. 2. Dr. Thad Ranger.  HISTORY OF PRESENT ILLNESS:  The patient is a 50 year old female with known history of bipolar disorder who has been experiencing generalized weakness over the last 2 months.  Was experiencing more generalized weakness than usual and decided to come to the ER.  In the ER, the patient was noted to have complaint of having some chest pressure over the last 2-3 days and she has been worked up for possible multiple sclerosis by her neurologist at [facility] and she was advised that she may need a lumbar puncture with evoked studies.  The patient did have an MRI of the brain, C-spine, and T-spine at [facility] in December and were all negative.  Please see admission note for remainder of history.  PAST MEDICAL HISTORY, FAMILY HISTORY, SOCIAL HISTORY, MEDICATIONS, AND ALLERGIES:  As per admission H and P.  PHYSICAL EXAMINATION AT THE TIME OF DISCHARGE:  VITAL SIGNS: Temperature 98.4, pulse 79,  respirations 20, and blood pressure 113/77. Pulse ox 98% on room air. GENERAL:  The patient is sitting up in chair, well-nourished white female. HEENT:  Normocephalic, atraumatic.  Pupils reactive to light.  Throat without erythema. CARDIOVASCULAR:  Regular rate and rhythm. LUNGS:  Clear bilaterally. ABDOMEN:  Positive bowel sounds. EXTREMITIES:  Edema.  HOSPITAL COURSE:  Head pressure.  The patient was admitted to the hospital.  She had a CT scan of the head that was negative.  MRI report was reviewed.  The neurologist saw her, did not think she needs a LP, thinking it was more like a tension headache.  The patient stated that she has been worked up for that before, and she was told it was not a migraine or tension headaches.  The patient does not want to take the medication prescribed per Dr. Anne Hahn.  So, she will go home on the medication that she came in with and she will follow up with Dr. Marjory Lies in the office.  LABS AT THE TIME OF DISCHARGE:  ANA negative.  Cardiac markers negative. Sodium 138, potassium 4.3, chloride 111, CO2 of 26, glucose 89, BUN 9, creatinine 0.67, AST 20, ALT  13.  WBC 6.5, hemoglobin 11.8, MCV 94, and platelets 264,000.  C4 is 29, C3 is 111, iron 77, TIBC 330, percent sat 23, ESR of 1.  Monospot test negative.  TSH of 1.021, free T4 1.12, D- dimer negative, less than 0.22.  TIME SPENT:  With the patient and doing this discharge is approximately 35 minutes.     Ladell Pier, M.D.     [place]/MEDQ  D:  10/15/2010  T:  10/15/2010  Job:  [id number]  cc:   Metta Clines, MD Fax: [phone]  Electronically Signed by Ladell Pier M.D. on 10/24/2010 07:56:58 AM

## 2010-10-28 ENCOUNTER — Ambulatory Visit
Admission: RE | Admit: 2010-10-28 | Discharge: 2010-10-28 | Disposition: A | Discharge status: Home / Self Care | Payer: BC Managed Care – PPO | Source: Ambulatory Visit | Attending: Obstetrics | Admitting: Obstetrics

## 2010-10-28 DIAGNOSIS — Z1231 Encounter for screening mammogram for malignant neoplasm of breast: Secondary | ICD-10-CM

## 2010-10-30 LAB — NEURONAL NUCLEAR AUTOABS(IFA), IGG-BLD
HU Ab, IFA: NEGATIVE
Ri Ab IFA: NEGATIVE
YO AB IFA: NEGATIVE

## 2010-10-31 ENCOUNTER — Ambulatory Visit: Payer: BC Managed Care – PPO

## 2010-11-03 LAB — TSH: TSH: 2.645 u[IU]/mL (ref 0.350–4.500)

## 2010-11-03 LAB — CBC
MCV: 90.8 fL (ref 78.0–100.0)
Platelets: 260 10*3/uL (ref 150–400)
RDW: 11.5 % (ref 11.5–15.5)
WBC: 7.8 10*3/uL (ref 4.0–10.5)

## 2010-11-03 LAB — DIFFERENTIAL
Basophils Relative: 1 % (ref 0–1)
Lymphocytes Relative: 21 % (ref 12–46)
Monocytes Absolute: 0.8 10*3/uL (ref 0.1–1.0)
Monocytes Relative: 10 % (ref 3–12)
Neutro Abs: 5.2 10*3/uL (ref 1.7–7.7)

## 2010-11-03 LAB — URINALYSIS, ROUTINE W REFLEX MICROSCOPIC
Nitrite: NEGATIVE
Specific Gravity, Urine: 1.009 (ref 1.005–1.030)
Urobilinogen, UA: 0.2 mg/dL (ref 0.0–1.0)

## 2010-11-03 LAB — COMPREHENSIVE METABOLIC PANEL
Albumin: 3.7 g/dL (ref 3.5–5.2)
Alkaline Phosphatase: 55 U/L (ref 39–117)
BUN: 8 mg/dL (ref 6–23)
Potassium: 3.9 mEq/L (ref 3.5–5.1)
Total Protein: 6.9 g/dL (ref 6.0–8.3)

## 2010-11-03 LAB — LITHIUM LEVEL: Lithium Lvl: 0.42 mEq/L — ABNORMAL LOW (ref 0.80–1.40)

## 2010-11-04 ENCOUNTER — Telehealth: Payer: Self-pay

## 2010-11-04 ENCOUNTER — Telehealth: Payer: Self-pay | Admitting: Internal Medicine

## 2010-11-04 NOTE — Telephone Encounter (Signed)
Pt says she ate some undercooked meat last Wednesday night and has been vomiting and having diarrhea since Thursday morning. Gatorade seems to be staying down today. She states that she thinks the worst of it has run its course but wants to ensure that she is on the right path as far as her tx. Advised pt to go to the ED for IV fluids if she feels dehydrated. Pt declines ED. She states that she is not going to the ED because she has spent too much money on ED visits. Further advice?

## 2010-11-04 NOTE — Telephone Encounter (Signed)
zofran 8mg  tid prn n/v. If not allergic and no interactions.

## 2010-11-04 NOTE — Telephone Encounter (Signed)
Pt called and said that she has had food poisoning. Pt is trying to drink fluids, but can eat anything since last Wednesday night.  Pt is wanting to know what she can do?

## 2010-11-04 NOTE — Telephone Encounter (Signed)
Pt declines zofran rx. States that she has read about "this kind of thing" and everything she read says to "just get it out and that's what I did. I got it out"

## 2010-11-04 NOTE — Telephone Encounter (Signed)
Returned pt call. Left detailed message letting pt know, Per Dr. Rodena Medin, drink plenty of fluids and eat soup and crackers...cereal and milk is not the proper food to eat while having these gastro issues.  Also advised pt that taking medication will not slow down her recovery process so it is ok to take meds.

## 2010-11-05 LAB — DIFFERENTIAL
Basophils Absolute: 0 10*3/uL (ref 0.0–0.1)
Eosinophils Relative: 3 % (ref 0–5)
Lymphocytes Relative: 22 % (ref 12–46)
Lymphs Abs: 1.6 10*3/uL (ref 0.7–4.0)
Monocytes Absolute: 0.7 10*3/uL (ref 0.1–1.0)
Monocytes Relative: 9 % (ref 3–12)
Neutro Abs: 5 10*3/uL (ref 1.7–7.7)

## 2010-11-05 LAB — POCT I-STAT, CHEM 8
BUN: 14 mg/dL (ref 6–23)
Calcium, Ion: 1.13 mmol/L (ref 1.12–1.32)
Chloride: 110 mEq/L (ref 96–112)
Creatinine, Ser: 0.4 mg/dL (ref 0.4–1.2)
Glucose, Bld: 114 mg/dL — ABNORMAL HIGH (ref 70–99)
TCO2: 22 mmol/L (ref 0–100)

## 2010-11-05 LAB — POCT URINALYSIS DIP (DEVICE)
Bilirubin Urine: NEGATIVE
Glucose, UA: NEGATIVE mg/dL
Nitrite: NEGATIVE

## 2010-11-05 LAB — CBC
Hemoglobin: 14.3 g/dL (ref 12.0–15.0)
RBC: 4.51 MIL/uL (ref 3.87–5.11)
RDW: 12.4 % (ref 11.5–15.5)

## 2010-11-06 ENCOUNTER — Telehealth: Payer: Self-pay | Admitting: Internal Medicine

## 2010-11-06 NOTE — Telephone Encounter (Signed)
Spoke with [name]. Her concern is about whether or not she needs to make the appt at [facility]. She wants Dr. Ilda Foil advise on this because she feels like he is the person that knows her situation best and wants to know what he recommends for her to do. PLEASE ADVISE.

## 2010-11-06 NOTE — Telephone Encounter (Signed)
Pt called and said that she needs to talk with Dr Ty Hilts nurse re: the referral that has been made. Pt has some concerns about and wants to discuss. Pls call asap.

## 2010-11-07 NOTE — Telephone Encounter (Signed)
Always a believer in second opinions. So if she is still having sx's/problems then i encourage it.

## 2010-11-08 NOTE — Telephone Encounter (Signed)
Pt aware.

## 2010-11-13 ENCOUNTER — Telehealth: Payer: Self-pay | Admitting: Internal Medicine

## 2010-11-13 NOTE — Telephone Encounter (Signed)
Done. Pt aware 

## 2010-11-13 NOTE — Telephone Encounter (Signed)
Pt returned call and said to put 11/04/10 as the return date on letter for work. Pls call when ready for pick up.

## 2010-11-13 NOTE — Telephone Encounter (Signed)
Pt called and said that the date to return to work is incorrect. The date Dr Rodena Medin put down was 10/25/10 and it should have been 11/04/10. Pt wants the letter to read,Pt may return to work after 11/04/10. Pt said that she didn't return to work until later that week. Pls correct letter and notify patient when ready for pick asap. Pts work needs this info right away. Pt still has not been able to set up ov with Dr at [facility], because the Dr only sees pts on Friday and pt works every Friday.

## 2010-11-28 LAB — POCT I-STAT, CHEM 8
BUN: 16 mg/dL (ref 6–23)
Calcium, Ion: 1.17 mmol/L (ref 1.12–1.32)
Chloride: 109 mEq/L (ref 96–112)
Glucose, Bld: 137 mg/dL — ABNORMAL HIGH (ref 70–99)
TCO2: 23 mmol/L (ref 0–100)

## 2010-11-28 LAB — DIFFERENTIAL
Basophils Relative: 0 % (ref 0–1)
Eosinophils Relative: 2 % (ref 0–5)
Lymphocytes Relative: 16 % (ref 12–46)
Monocytes Absolute: 0.8 10*3/uL (ref 0.1–1.0)
Monocytes Relative: 8 % (ref 3–12)
Neutro Abs: 8.3 10*3/uL — ABNORMAL HIGH (ref 1.7–7.7)

## 2010-11-28 LAB — CBC
HCT: 40.5 % (ref 36.0–46.0)
Hemoglobin: 13.8 g/dL (ref 12.0–15.0)
MCHC: 34.1 g/dL (ref 30.0–36.0)
RBC: 4.43 MIL/uL (ref 3.87–5.11)
RDW: 12.4 % (ref 11.5–15.5)

## 2010-11-28 LAB — URINALYSIS, ROUTINE W REFLEX MICROSCOPIC
Glucose, UA: NEGATIVE mg/dL
Hgb urine dipstick: NEGATIVE
Ketones, ur: NEGATIVE mg/dL
Protein, ur: NEGATIVE mg/dL
pH: 6.5 (ref 5.0–8.0)

## 2010-12-02 LAB — HM MAMMOGRAPHY: HM Mammogram: NORMAL

## 2010-12-03 ENCOUNTER — Ambulatory Visit: Payer: BC Managed Care – PPO | Admitting: Internal Medicine

## 2010-12-04 ENCOUNTER — Ambulatory Visit (INDEPENDENT_AMBULATORY_CARE_PROVIDER_SITE_OTHER): Payer: BC Managed Care – PPO | Admitting: Internal Medicine

## 2010-12-04 ENCOUNTER — Telehealth: Payer: Self-pay

## 2010-12-04 ENCOUNTER — Encounter: Payer: Self-pay | Admitting: Internal Medicine

## 2010-12-04 VITALS — BP 102/68 | HR 71 | Wt 131.0 lb

## 2010-12-04 DIAGNOSIS — R002 Palpitations: Secondary | ICD-10-CM

## 2010-12-04 DIAGNOSIS — R000 Tachycardia, unspecified: Secondary | ICD-10-CM

## 2010-12-04 DIAGNOSIS — M6281 Muscle weakness (generalized): Secondary | ICD-10-CM

## 2010-12-04 DIAGNOSIS — D649 Anemia, unspecified: Secondary | ICD-10-CM

## 2010-12-04 NOTE — Telephone Encounter (Signed)
Patient call back for appt. For 12/05/2010 @ 9:30

## 2010-12-05 ENCOUNTER — Encounter (INDEPENDENT_AMBULATORY_CARE_PROVIDER_SITE_OTHER): Payer: BC Managed Care – PPO

## 2010-12-05 ENCOUNTER — Telehealth: Payer: Self-pay

## 2010-12-05 DIAGNOSIS — I471 Supraventricular tachycardia: Secondary | ICD-10-CM

## 2010-12-05 NOTE — Telephone Encounter (Signed)
Message copied by Kyung Rudd on Thu Dec 05, 2010  4:16 PM ------      Message from: Letitia Libra, Maisie Fus      Created: Wed Dec 04, 2010  5:39 PM       b12 low nl. Folate nl. otc b12 po qd.

## 2010-12-05 NOTE — Telephone Encounter (Signed)
Pt aware.

## 2010-12-06 ENCOUNTER — Telehealth: Payer: Self-pay | Admitting: *Deleted

## 2010-12-06 NOTE — Telephone Encounter (Signed)
Pt wants to know how long she should take B12 and if she can take 2000 daily instead of 1000?

## 2010-12-06 NOTE — Telephone Encounter (Signed)
Pt aware.

## 2010-12-06 NOTE — Telephone Encounter (Signed)
Ok for 2000 mcg. Take it for at least 4 months. If we have a f/u appt down the road we can recheck the level

## 2010-12-08 ENCOUNTER — Encounter: Payer: Self-pay | Admitting: Internal Medicine

## 2010-12-08 DIAGNOSIS — R002 Palpitations: Secondary | ICD-10-CM | POA: Insufficient documentation

## 2010-12-08 DIAGNOSIS — D649 Anemia, unspecified: Secondary | ICD-10-CM | POA: Insufficient documentation

## 2010-12-08 HISTORY — DX: Palpitations: R00.2

## 2010-12-08 NOTE — Assessment & Plan Note (Signed)
Obtain B12 and folate levels

## 2010-12-08 NOTE — Progress Notes (Signed)
  Subjective:    Patient ID: [name], female    DOB: 09/06/1960, 50 y.o.   MRN: [id number]  HPI Patient presents to clinic for evaluation of weakness. Continues to have subjective weakness specifically of the legs and arms. Feels that she is dragging her right foot at the end of the day. Feels lik eher muscles are wasting. Intermittently notes that her speech may slur with fatigue. Notes no fatigability of her eyes. Status post evaluation reportedly normal. Denies headaches or constipationas previous. A second opinion with neurology at [facility] scheduled for May 2.Has undergone extensive evaluation including MRI of head and neck thoracic and lumbar spine.Laboratory evaluation unrevealing to datewith exception of last CBC obtained by specialist appears to indicate minimal anemiawith hemoglobin of 11.8. Iron level is normal. Does note intermittent episodes of presyncope with associated tachycardia lasting several minutes followed by spontaneous resolution. No syncope. No alleviating or exacerbating factors.  Reviewed past medical history, medications and allergies  Review of Systems see history of present illness    Objective:   Physical Exam   Physical Exam  Vitals reviewed. Constitutional:  appears well-developed and well-nourished. No distress.  HENT:  Head: Normocephalic and atraumatic.   Nose: Nose normal.  Mouth/Throat: Oropharynx is clear and moist. No oropharyngeal exudate.  Eyes: Conjunctivae and EOM are normal. Pupils are equal, round, and reactive to light. Right eye exhibits no discharge. Left eye exhibits no discharge. No scleral icterus.  Neck: Neck supple. No thyromegaly present.  Cardiovascular: Normal rate, regular rhythm and normal heart sounds.  Exam reveals no gallop and no friction rub.   No murmur heard. Pulmonary/Chest: Effort normal and breath sounds normal. No respiratory distress.  has no wheezes.  has no rales.  Lymphadenopathy:   no cervical  adenopathy.  Neurological:  is alert.  Skin: Skin is warm and dry.  not diaphoretic.  Psychiatric: normal mood and affect.      Assessment & Plan:

## 2010-12-08 NOTE — Assessment & Plan Note (Signed)
Proceed with neurology second opinion

## 2010-12-08 NOTE — Assessment & Plan Note (Signed)
Schedule Holter monitor

## 2010-12-10 ENCOUNTER — Telehealth: Payer: Self-pay

## 2010-12-10 NOTE — Telephone Encounter (Signed)
Pt aware.

## 2010-12-17 ENCOUNTER — Other Ambulatory Visit: Payer: Self-pay | Admitting: *Deleted

## 2010-12-17 MED ORDER — SUMATRIPTAN SUCCINATE 100 MG PO TABS: 100.0000 mg | ORAL_TABLET | ORAL | Status: DC | PRN

## 2010-12-17 NOTE — Telephone Encounter (Signed)
Ok to fill 

## 2010-12-17 NOTE — Telephone Encounter (Signed)
Pt. Is calling to ask for Imitrex to be called to [facility].  She had a Mohs procedure on her forehead yesterday, and having a migraine with vomiting.

## 2010-12-31 NOTE — H&P (Signed)
NAMESTEFHANIE, [name] NO.:  [id number]   MEDICAL RECORD NO.:  [id number]          PATIENT TYPE:  IPS   LOCATION:  0300                          FACILITY:  [facility]   PHYSICIAN:  Jasmine Pang, M.D. DATE OF BIRTH:  January 21, 1961   DATE OF ADMISSION:  06/21/2008  DATE OF DISCHARGE:                       PSYCHIATRIC ADMISSION ASSESSMENT   PATIENT IDENTIFICATION:  This is a 50 year old female voluntarily  admitted on June 21, 2008.   HISTORY OF PRESENT ILLNESS:  The patient reports feeling very depressed,  stated that she is really down,  catastrophizing about life events,  wanting to get rid of her dogs, having some passive suicidal thoughts.  She states that over the past few months she has been having to deal  with her mother's illness.  The patient herself has also been  hospitalized for gastroparesis as they thought was due to medication  that she has been on.  Also, had a recent relationship breakup, and also  then developed a flu.  She has been sleeping well.  She states her  appetite has been satisfactory.  She does report that she is very  sensitive to medications and just small amounts can make her have  significant side effects.   PAST PSYCHIATRIC HISTORY:  The patient has been here prior with her last  admission being April 2007, where she was having some impulsive  thoughts, poor concentration and depressed mood.  She reports that she  was diagnosed with bipolar at the age of 26.  She doe have a  psychiatrist, Dr. Andee Poles at phone number [phone], a therapist  named Stevphen Rochester.  In the past, the patient has been on Abilify and  lithium which was discontinued due to her gastroparesis.   SOCIAL HISTORY:  A 50 year old female lives in [place].  She has a  supportive family.  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   FAMILY HISTORY:  None.   ALCOHOL AND DRUG HABITS:  No consistent alcohol or drug use.   PRIMARY CARE Jamileth Putzier:  Dr.  Berniece Andreas.   MEDICAL PROBLEMS:  No acute or chronic health issues.   MEDICATIONS:  1. Klonopin 1 mg at bedtime.  2. Estradiol daily.  3. Wellbutrin 150 mg daily.   DRUG ALLERGIES:  No known allergies.   PHYSICAL EXAMINATION:  The patient was fully assessed at [facility]  emergency department.  She is an overweight middle-aged female, appears  in no acute distress.  She offers no physical complaints.  VITAL SIGNS:  Her temperature is 98.7, 93 heart rate, 18 respirations,  blood pressure is 127/87, 140 pounds 5 feet tall.   LABORATORY DATA:  Urine drug screen is negative.  Urinalysis is  negative.  Alcohol level less than five.  CBC within normal limits.  C-  met within normal limits.   MENTAL STATUS EXAM:  This is a fully alert, cooperative female, casually  dressed, good eye contact.  Speech is coherent, articulate.  The  patient's mood is depressed.  The patient is very pleasant, smiles often  throughout the interview.  Thought process are coherent.  There  is no  evidence of any psychotic thinking.  Denies any suicidal or homicidal  thoughts.  Cognitive function intact.  Her memory is good.  Judgment and  insight appear to be good.   DIAGNOSES:  AXIS I:  Bipolar disorder NOS.  AXIS II:  Deferred.  AXIS III:  No known medical conditions.  AXIS IV:  Psychosocial problems related burden of illness.  AXIS V:  Current is 35-40.   PLAN:  Contract for safety.  Stabilize mood and thinking.  We will  resume her Wellbutrin.  We did discuss mood stabilizers.  The patient  did receive benefit with Depakote.  We did discuss the risk and benefits  of all mood stabilizers, weight gain, sedation.  The patient was  agreeable to beginning Depakote.  Her tentative length of stay at this  time is 2-3 days.      Landry Corporal, N.P.      Jasmine Pang, M.D.  Electronically Signed    JO/MEDQ  D:  06/23/2008  T:  06/23/2008  Job:  [id number]

## 2010-12-31 NOTE — Consult Note (Signed)
NAMEBAYYINAH, DUKEMAN              ACCOUNT NO.:  [id number]   MEDICAL RECORD NO.:  [id number]          PATIENT TYPE:  INP   LOCATION:  1511                         FACILITY:  [facility]   PHYSICIAN:  Gerri Spore B. Earlene Plater, M.D.  DATE OF BIRTH:  03-06-1961   DATE OF CONSULTATION:  11/30/2007  DATE OF DISCHARGE:                                 CONSULTATION   REQUESTING PHYSICIAN:  Anselmo Rod, M.D.   CHIEF COMPLAINT:  Abdominal pain.   HISTORY OF PRESENT ILLNESS:  A 50 year old white female with a long  history of abdominal pain, presumed to be irritable bowel syndrome,  complains of intermittent bloating, nausea, and diarrhea.  Prior to  admission, she had a sudden episode of periumbilical pain, which was  severe, with associated diarrhea.  The pain has been intermittent and at  times severe since that time.  The patient has had endoscopy, which was  normal, while in the hospital.  She has had previous hysterectomy and  bilateral salpingo-oophorectomy due to endometriosis.  Subsequent  laparoscopy with Dr. Genevive Bi at [facility] in 2003 for an ovarian remnant.  At  that point, per his office notes, it was recommended that she have no  further GYN surgery, as he thought her pain was probably functional/GI  irritable bowel type pain.  Recent CT scan was also normal.   PAST MEDICAL HISTORY:  1. Bipolar disorder.  2. Duodenal ulcer.   PAST SURGICAL HISTORY:  1. Abdominal hysterectomy and unilateral salpingo-oophorectomy in      2001.  2. Subsequent contralateral unilateral salpingo-oophorectomy, both by      Dr. Eda Paschal.  3. Subsequent laparoscopy and removal of ovarian remnant by Dr. Genevive Bi      at [facility].  4. Laparoscopic cholecystectomy.   MEDICATIONS:  Lithium, Wellbutrin, Klonopin, estradiol, and Mobic.   ALLERGIES:  None.   SOCIAL HISTORY:  Patient is a Engineer, civil (consulting) at [employer].  No alcohol, tobacco, or  drugs.   FAMILY HISTORY:  Noncontributory.   REVIEW OF SYSTEMS:  Otherwise  noncontributory.   PHYSICAL EXAMINATION:  VITAL SIGNS:  Stable.  Patient is afebrile.  GENERAL:  She is alert and oriented in no acute distress.  SKIN:  Warm and dry.  No lesions.  ABDOMEN:  Soft.  Nonacute.  Mild infraumbilical tenderness just to the  left of midline.  PELVIC:  Normal external genitalia.  Vagina is normal.  Cervix and  uterus are surgically absent.  Vaginal cuff is nontender.  No adnexal  masses.  Rectovaginal is within normal limits and shows no nodularity in  the cul de sac.   Labs are reviewed.  She has no leukocytosis or significant abnormality  on complete metabolic profile or urinalysis.   ASSESSMENT:  Periumbilical pain, associated with nausea, diarrhea.  Probable gastrointestinal source, of which Dr. Loreta Ave and Dr. Elnoria Howard are  diligently evaluating.  There is no obvious gynecological source for her  pain at this point.  Furthermore, Dr. Genevive Bi, who is a world-renowned  expert in pelvic pain, saw her recently, and felt there was no  gynecological source for her pain and recommended against any further  surgery from a gynecological perspective.  However, that was about two  years ago, I did suggest that perhaps as an outpatient she could follow  up one last time with him to be sure that nothing had changed.      Gerri Spore B. Earlene Plater, M.D.  Electronically Signed     WBD/MEDQ  D:  11/30/2007  T:  11/30/2007  Job:  [id number]

## 2010-12-31 NOTE — Assessment & Plan Note (Signed)
[facility]                                 ON-CALL NOTE   FARRA, NIKOLIC                       MRN:          [id number]  DATE:11/27/2007                            DOB:          1961-01-05    Phone number [phone].  Date of service is April 11. Date of birth [birth year].   PRIMARY:  Dr. Fabian Sharp.   SUBJECTIVE:  The patient has been having several days of severe  abdominal pain.  She called Dr. Loreta Ave and saw her on Tuesday of this past  week.  She had lab tests done and a plain abdominal film looking for  obstruction given her history of surgery.  All lab tests and x-rays came  back normal.  She has had intermittent abdominal pain since then and has  no appetite and feels like she is doing worse now that she had some food  last night.  She feels like she has a low-grade fever.  She has vomited  some but is tolerating liquids.  Her pain is severe.   ASSESSMENT/PLAN:  Discussed options with the patient.  Given her  abdominal pain been severe and difficulty tolerating p.o. intake, she  will consider being evaluated at the emergency room.     Kerby Nora, MD  Electronically Signed    AB/MedQ  DD: 11/27/2007  DT: 11/27/2007  Job #: [id number]

## 2010-12-31 NOTE — H&P (Signed)
[name], [name]              ACCOUNT NO.:  [id number]   MEDICAL RECORD NO.:  [id number]          PATIENT TYPE:  INP   LOCATION:  1511                         FACILITY:  [facility]   PHYSICIAN:  Hedwig Morton. Juanda Chance, MD     DATE OF BIRTH:  1961/04/09   DATE OF ADMISSION:  11/27/2007  DATE OF DISCHARGE:                              HISTORY & PHYSICAL   PRIMARY CARE PHYSICIAN:  Dr. Fabian Sharp.   CHIEF COMPLAINT:  Nausea, vomiting, abdominal pain and fever.   HISTORY:  [name] is a 50 year old white female known to Dr. Loreta Ave, primary  patient of Dr. Fabian Sharp, who presents with an acute illness with nausea,  vomiting, periumbilical abdominal pain, abdominal distention and fever  to 101 at home over the past 4 days.  She comes in because her symptoms  are progressing.  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  She says she had a similar pain episode in January of 2009 and was  seen in the ER at [facility] and had CT scan which was negative.  She does have  history of an ulcer due to lithium with a bleeding duodenal ulcer on  endoscopy at age 40.  She says she also had some diarrhea with this  illness which has now resolved.  She did not note any melena or  hematochezia.  She was seen and evaluated by Dr. Juanda Chance and admitted for  hydration, antibiotics and pain control and further diagnostic workup.   CURRENT MEDICATIONS:  1. Lithium 600 mg daily at bedtime.  2. Wellbutrin.  3. Klonopin 1 mg daily at bedtime.  4. Estradiol 1 mg daily at bedtime.  5. Mobic one p.o. daily to every other day.  6. She had been on Nexium which was stopped some time ago.   ALLERGIES AND INTOLERANCES:  No known drug allergies.   PAST MEDICAL HISTORY:  1. Pertinent for bipolar disorder.  2. Previous duodenal ulcer apparently related to lithium.  3. Status post TAH-BSO in 2001, Dr. Eda Paschal, for endometriosis.  4. Status post lap  cholecystectomy in 2003.  5. She is status post left oophorectomy in 2003.   SOCIAL HISTORY:  The patient is employed as a Engineer, civil (consulting).  She is a  nonsmoker, uses alcohol occasionally socially.   FAMILY HISTORY:  Is noncontributory.   REVIEW OF SYSTEMS:  GENERAL:  Acute illness x4 days.  Weight has been  stable recently.  Appetite has been fine prior to acute onset of  symptoms.  CARDIOVASCULAR:  Negative for chest pain or anginal symptoms.  PULMONARY:  Negative for cough, shortness of breath or sputum  production.  GENITOURINARY:  Negative for dysuria, urgency or frequency.  MUSCULOSKELETAL:  Pertinent for recent plantar fasciitis for which she  had been taking the Mobic.  She says her foot is improving and she had  not been taking it quite as regularly recently.  GYN:  Pertinent for  endometriosis which was severe.  She is status post TAH and has now had  both ovaries removed.  All other review of systems negative.   LABORATORY STUDIES:  WBC of 3.3.  H and H 13.2 and 40.5, platelet count  271.  Sed rate of 2, pro-time 12.9, INR of 1.  Electrolytes within  normal limits.  Glucose was 109.  Liver function studies, SGOT of 70,  SGPT of 86, otherwise normal.  Albumin 3.4, amylase and lipase both  normal.  TSH of 4.6 and UA was negative.   IMPRESSION:  1. A 50 year old white female with acute abdominal pain, nausea,      vomiting and fever.  Rule out acute colitis, i.e. infectious versus      ischemic, rule out pancreatitis, rule out possible peptic ulcer      disease  2. Status post laparoscopic cholecystectomy.  3. History of endometriosis status post TAH (total abdominal      hysterectomy) and BSO (bilateral salpingo-oophorectomy).  4. Bipolar disorder.   PLAN:  The patient is admitted to the service of Dr. Lina Sar for IV  fluid hydration, bowel rest, pain control, antibiotics.  She will be  placed on PPI (proton pump inhibitor).  We will check CT scan of the  abdomen and pelvis  and then further workup pending findings on CT.  If  this is negative we will proceed with EGD.  For details please see the  orders.      Amy [place], PA-C      Dora M. Juanda Chance, MD  Electronically Signed    AE/MEDQ  D:  11/28/2007  T:  11/28/2007  Job:  [id number]   cc:   Anselmo Rod, M.D.  Fax: [phone]

## 2010-12-31 NOTE — Discharge Summary (Signed)
[name], [name]              ACCOUNT NO.:  [id number]   MEDICAL RECORD NO.:  [id number]          PATIENT TYPE:  INP   LOCATION:  1511                         [facility]ACILITY[facility]   PHYSICIAN:  Anselmo Rod, M.D.  DATE OF BIRTH:  [birth year]   DATE OF ADMISSION:  11/27/2007  DATE OF DISCHARGE:  12/02/2007                               DISCHARGE SUMMARY   REASON FOR ADMISSION:  Abdominal pain, with intermittent nausea and  diarrhea.   SECONDARY DIAGNOSES:  1. Bipolar disorder.  2. History of endometriosis, status post abdominal hysterectomy with      salpingo-oophorectomy.  3. Status post laparoscopic cholecystectomy.  4. Gastroesophageal reflux disease.  5. History of duodenal ulcer.   DISCHARGE DIAGNOSES:  1. Irritable bowel syndrome.  2. Gastroparesis.  3. Bipolar disorder.  4. History of duodenal ulcer.  5. Status post laparoscopic cholecystectomy.  6. S/P abdominal hysterectomy and thereafter salpingo-oophorectomy in      2001, and subsequent oophorectomy thereafter by Dr. Eda Paschal.  7. Gastroesophageal reflux disease.   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 The patient has a longstanding history  of bipolar disorder that has been well controlled on lithium,  Wellbutrin, and Klonopin, and was in her usual state of health until a  few days prior to hospitalization when she admits eating some raw  oysters.  She had severe abdominal pain with diarrhea and nausea, and as  the symptoms did not resolve within a couple of days she presented to  the [facility] Emergency Room on November 27, 2007, where she was seen by  Dr. Lina Sar who was on call for me and was hospitalized for further  workup.  She had an abdominal ultrasound done on November 29, 2007 that  showed possible steatosis of the liver, with increased heterogenicity.  She is  status post laparoscopic cholecystectomy. A gastric emptying scan  done on December 01, 2007 96% retention in 1 hour and 92% in 2 hours of the  radiolabeled meal, proving problems with gastroparesis.  The patient  also had an EGD done by Dr. Juanda Chance which was essentially unrevealing.  She was also found to have abnormal LFTs, and a hepatitis serology was  ordered.  She was negative for hepatitis C, and she had a hepatitis B  surface antibody positive, with a core antibody positive.  She was  started on Reglan, and her doses were titrated gradually to increase as  tolerated.  She did well on that and was discharged home on December 02, 2007, with plans for outpatient followup in a couple of weeks.   DISCHARGE DIAGNOSES:  See list above.   MEDICATIONS AT DISCHARGE:  1. Lithium 600 mg daily.  2. Wellbutrin LR 150 mg daily.  3. Klonopin 1 mg at bedtime.  4. Estradiol 1 mg at bedtime.  5. Nexium 40 mg per day.  6. Colace 100 mg 2 at bedtime as needed for constipation.  7. Align 4 mg  q.12 h., or Florastor 250 mg q.12 h.   RECOMMENDATIONS:  Outpatient followup in 2 weeks, or earlier if need be.      Anselmo Rod, M.D.  Electronically Signed     JNM/MEDQ  D:  01/06/2008  T:  01/06/2008  Job:  [id number]

## 2011-01-03 NOTE — Procedures (Signed)
[facility]  Patient:    [name], [name]                   MRN: [id number] Proc. Date: 06/03/00 Adm. Date:  [id number] Attending:  Charna Elizabeth CC:         Julieanne Cotton, M.D.                           Procedure Report  DATE OF BIRTH:  REFERRING PHYSICIAN:  PROCEDURE PERFORMED:  Esophagogastroduodenoscopy with biopsies.  ENDOSCOPIST:  Anselmo Rod, M.D.  INSTRUMENT USED:  Olympus video panendoscope.  INDICATIONS FOR PROCEDURE:  Epigastric/chest pain in a 50 year old white female rule out esophagitis, peptic ulcer disease, etc.  PREPROCEDURE PREPARATION:  Informed consent was procured from the patient. The patient was fasted for eight hours prior to the procedure.  PREPROCEDURE PHYSICAL:  The patient had stable vital signs.  Neck supple. Chest clear to auscultation.  S1, S2 regular.  Abdomen soft with normal abdominal bowel sounds.  Epigastric tenderness on palpation with guarding.  No rebound, no rigidity, no hepatosplenomegaly.  DESCRIPTION OF PROCEDURE:  The patient was placed in left lateral decubitus position and sedated with 40 mg of Demerol and 4 mg of Versed intravenously. Once the patient was adequately sedated and maintained on low-flow oxygen and continuous cardiac monitoring, the Olympus video panendoscope was advanced through the mouthpiece, over the tongue, into the esophagus under direct vision.  The entire esophagus appeared normal without evidence of ring stricture, masses, lesions, esophagitis or Barretts mucosa.  The scope was then advanced into the stomach.  A small hiatal hernia was seen on high retroflexion.  The entire gastric mucosa appeared healthy and without lesions and so did the proximal small bowel up to 60 cm.  IMPRESSION:  Normal esophagogastroduodenoscopy except for a small hiatal hernia.  RECOMMENDATION: 1. Antireflux measures have been advocated. 2. Outpatient follow-up has been advised for  further recommendation. DD:  06/03/00 TD:  06/03/00 Job: [id number]

## 2011-01-03 NOTE — Assessment & Plan Note (Signed)
[facility]                                 ON-CALL NOTE   [name], DEELEY                     MRN:          [id number]  DATE:08/09/2006                            DOB:          06/16/1961    PHONE NUMBER:  [phone]   Patient of Dr. Fabian Sharp.  Phone call at 12:35 p.m. on December 23.  I  called back promptly and got an answering machine.  Was paged back again  at 12:55 and talked to the patient.  She had been on Augmentin for 10  days, developed diarrhea and abdominal cramping.  A stool specimen was  sent for clostridium difficile and she was put on Flagyl 2 days ago.  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  She continues to be symptomatic, but  has not substantially changed since she has been on the Flagyl.   PLAN:  I told her I did not have access to the lab results on a Sunday,  and that I did not think I should be making the clinical decision about  treatment, regardless of what the lab results are at this point.  I did  tell her occasionally Flagyl is used for non C. diff colitis type  situations and she reaffirmed that there did not seem to be any side  effects from the Flagyl.  I told her to continue on the Flagyl until  tomorrow.  Hopefully, she can get a result from Dr. Fabian Sharp tomorrow.     Karie Schwalbe, MD  Electronically Signed    RIL/MedQ  DD: 08/09/2006  DT: 08/09/2006  Job #: [id number]   cc:   Neta Mends. Fabian Sharp, MD

## 2011-01-03 NOTE — Op Note (Signed)
[place]. [facility]  Patient:    [name], [name] Visit Number: [id number] MRN: [id number]          Service Type: END Location: ENDO Attending Physician:  Charna Elizabeth Proc. Date: 03/08/02 Admit Date:  03/08/2002   CC:         Reuel Boom L. Eda Paschal, M.D.  Pollyann Savoy, M.D.   Operative Report  DATE OF BIRTH:  Feb 02, 1961  REFERRING PHYSICIAN:  PROCEDURE PERFORMED:  Esophagogastroduodenoscopy.  ENDOSCOPIST:  Anselmo Rod, M.D.  INSTRUMENT USED:  Olympus video panendoscope.  INDICATION:  A 50 year old white female with a history of epigastric pain and left lower quadrant discomfort.  The patient has a history of peptic ulcer disease in the past, rule out recurrent ulcers.  PREPROCEDURE PREPARATION:  Informed consent was procured from the patient. The patient fasted for eight hours prior to the procedure.  PREPROCEDURE PHYSICAL:  VITAL SIGNS:  The patient has stable vital signs.  NECK:  Supple.  LUNGS:  Chest was clear to auscultation.  CARDIAC:  S1 and S2 regular.  ABDOMEN:  Soft with epigastric and periumbilical tenderness on palpation with guarding.  Left lower quadrant tenderness as well.  DESCRIPTION OF PROCEDURE:  The patient was placed in the left lateral decubitus position.  She was sedated with 70 mg of Demerol and 7 mg of Versed intravenously.  Once the patient was adequately sedated and maintained on low flow oxygen and continuous cardiac monitoring, the Olympus video panendoscope was advanced through the mouthpiece, over the tongue, and into the esophagus under direct vision.  The entire esophagus appeared normal with no evidence of ring, strictures, masses, esophagitis, or Barretts mucosa.  The scope was then advanced into the stomach.  The entire gastric mucosa and the proximal small bowel appeared normal.  IMPRESSION:  Normal esophagogastroduodenoscopy.  RECOMMENDATIONS:  Proceed with colonoscopy. Attending  Physician:  Charna Elizabeth DD:  03/08/02 TD:  03/08/02 Job: [id number]

## 2011-01-03 NOTE — H&P (Signed)
[facility]  Patient:    [name], [name]                  MRN: [id number] Adm. Date:  03/10/00 Dictator:   Luan Pulling, M.D.                   Psychiatric Admission Assessment  DATE OF ADMISSION:  March 10, 2000  PATIENT IDENTIFICATION:  [name] is a 50 year old white gay female who was admitted for her first admission at [facility] on March 10, 2000.  At the time of admission, her main complaint was depression and not caring whether she was going to stay alive or not.  HISTORY OF PRESENT ILLNESS:  Presenting symptoms are to certain extent a continuum of problems patient has had since the age of 29 when she was first hospitalized with manic episodes followed later by deep depression.  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  Other stressors are moving recently with a female companion with whom she had never before dwelled, and with whom she has been involved for the past six or seven months.  She also bought a new place to live in [place].  She previously lived in [place].  Patient complains of insomnia, both initial and middle, decreased of appetite, loss of about 5 pounds of weight within last week, having suicidal thoughts with no particular plan but with feeling of losing control and not caring whether she will stay alive or not.  She also complains of traffic jam in her head and feeling like she is getting crazy.  Patient denies any hallucinatory experience recently.  PAST PSYCHIATRIC HISTORY:  As mentioned before, the patient was treated first time for psychiatric problems at the age of 17.  At that time, she was hospitalized at [facility] and  later at [facility] at the age of 22.  Both times, she was diagnosed as psychotic bipolar disorder and it seems like from her reports that she was manic.  She denies any previous suicidal attempts, but recurrence of suicidal thoughts are almost normal for this patient.  Recently, she feels overwhelmed and she does not see any reason to live.  Previously, she was treated with numerous medications.  She had very poor tolerance; in fact, she developed toxicity to lithium and Depakote.  She did not tolerate Risperdal or Topamax.  She was also treated with several antidepressants, and she tolerated Wellbutrin the best, yet it tended to destabilize her.  At present, she is under the care of Dr. Leonides Sake, a psychiatrist in [place].  She is on Neurontin 200 mg three times a day and 300 mg at bedtime and Klonopin 0.5 mg at bedtime.  DRUG ALLERGIES:  MORPHINE.  SUBSTANCE ABUSE HISTORY:  Patient denies using drugs, drinks socially very seldom, small amount of alcohol.  She does not smoke.  PAST MEDICAL HISTORY:  Patient was a few months ago hospitalized for six days for a viral infection with possible cardiac involvement.  She chest pain and shortness of breath.  Several years ago, she underwent hysterectomy due to endometriosis and partial ovariectomy due to ovarian cysts.  She was tried on hormone substitute but could  not tolerate these medications since she was getting depressed while taking them.  FAMILY HISTORY:  Significant for depression.  SOCIAL HISTORY:  Patient is an Astronomer. and she has two college degrees in sociology and criminal justice.  She was briefly married when 50 years old and shortly after divorced.  She proceeds with gay lifestyle for many years. Patient lists a lot of friends and good support from family.  Patients father suffered from depression and anxiety.  Patients maternal grandmother suffered from bipolar disorder and patients sister suffers from  depression.  MENTAL STATUS EXAMINATION:  Reveals a small-framed white female, neatly dressed and groomed, no makeup, good eye contact.  Her speech was normal, mood was depressed, affect was anxious yet pleasant.  Thought process did not reveal suicidal intention, but she was preoccupied with suicidal thoughts and with thoughts of not having any reason to live.  She felt overwhelmed by her thought of her new job and fear that she made mistake with new assignment. Patient reports confused thoughts and racing thoughts, almost up to the point of a traffic jam in her head.  She was alert and oriented x 3, with memory considered between fair and good, but grossly decreased concentration.  Her insight into her problems was between fair and good, and judgment was fair.  ADMISSION DIAGNOSES: Axis I:    Bipolar disorder, depressed, with some agitation. Axis II:   Personality disorder, not otherwise specified, with borderline            features. Axis III:  Status post hysterectomy, status post recent viral infection. Axis IV:   Psychosocial stressors severe due to occupational problems,            problems in relationship and housing problems. Axis V:    Current global assessment of function 30, maximum in past year 65.  DISCUSSION AND RECOMMENDATIONS:  This young lady was briefly treated with neuroleptics during her psychotic episodes.  At this point she doesnt seem to be psychotic but by her words "she is getting there."  She has some unsolved problems.  She is in a relationship about which she has at least mixed feelings.  It seems like she and her partner are not intellectually compatible and yet she does not have enough stamina to break up this relationship and look for a more fulfilling situation.  Also, her new job is putting a lot of pressure on her.  We will continue special observations and continue patients hospitalization.  I do believe that she needs to be hospitalized since she  is in the process of decompensating and previous decompensations required long-term hospitalizations.  We will start patient on Wellbutrin 75 mg daily, increase of Neurontin to 300 mg three times a day and 400 mg at bedtime.  We  will start her on Zyprexa 1.25 mg at bedtime.  We will ask for conversion of the 23 hour emergency state to a regular admission for the reasons outlined above.  Hopefully, within the next few days she will get stable enough to be discharged back to her outpatient care. DD:  03/11/00 TD:  03/11/00 Job: [id number]

## 2011-01-03 NOTE — Cardiovascular Report (Signed)
[place]. [facility]  Patient:    [name], [name]                     MRN: [id number] Proc. Date: 10/09/99 Adm. Date:  [id number] Attending:  Alric Quan CC:         Clinton D. Maple Hudson, M.D.             Luis Abed, M.D. [facility]             CV Laboratory             Heather Roberts, M.D.                        Cardiac Catheterization  INDICATIONS:  Ms. Cowper is a 50 year old admitted with chest discomfort. She has had an extensive work-up.  She has had a normal echocardiogram with normal right atrium and right ventricle and normal RV systolic pressure.  As a result, we elected not to perform right heart catheterization.  She was brought to the catheterization lab for further evaluation.  PROCEDURES:  Left heart catheterization, selective coronary angiography, selective left ventriculography.  DESCRIPTION OF PROCEDURE:  The procedure was performed from the right femoral artery using 5 French catheters, a 6 French sheath was used.  She tolerated the  procedure without complication.  We elected not to give intracoronary nitroglycerin because of the aortic pressure was approximately 100.  At the highest it was 105 systolic.  HEMODYNAMICS:  The central aortic pressure was 105/70. LV pressure 108/16.  No gradient on pullback across the aortic valve.  ANGIOGRAPHIC DATA:  The left main coronary artery is short.  There is no obvious critical disease.  The left anterior descending artery courses to the apex.  It bifurcates distally into a diagonal LAD.  The LAD is free of critical disease.  There is a ramus intermedius vessel.  There is some mild pinching at the ostium. In the worst view this does not appear to exceed about 50%.  Of note, we did not give intracoronary nitroglycerin to expand the distal vessel because of borderline blood pressure.  However, no critical lesion is noted at this site.  The AV circumflex was small and free  of disease.  The right coronary artery is a large caliber vessel providing the posterior descending and posterolateral branch.  It is a very large caliber vessel, free f disease.  LEFT VENTRICULOGRAPHY:  Ventriculography in the RAO projection reveals preserved global systolic function.  CONCLUSIONS: 1. Normal left ventricular function. 2. Normal right coronary artery. 3. No critical disease of the left coronary system.  DISPOSITION:  Final decisions will be made by Dr. Myrtis Ser and Dr. Maple Hudson.  This is  complicated evaluation.  Dr. Orson Aloe has seen the images in the catheterization lab. DD:  10/09/99 TD:  10/10/99 Job: [id number]

## 2011-01-03 NOTE — Discharge Summary (Signed)
[place]. [facility]  Patient:    [name], [name]                     MRN: [id number] Adm. Date:  [id number] Disc. Date: [id number] Attending:  Alric Quan Dictator:   Rozell Searing, P.A. CC:         Clinton D. Maple Hudson, M.D.             Heather Roberts, M.D.             Phillip Heal, M.D.             Aundra Dubin, M.D.                           Discharge Summary  PROCEDURES: 1. Cardiac catheterization - October 09, 1999. 2. Pulmonary function test.  REASON FOR ADMISSION:  [name] is a 50 year old female, without documented CAD, who was recently hospitalized for chest pain and underwent extensive diagnostic workup all but including a cardiac catheterization.  An exercise Cardiolite was normal as were chest x-rays, chest CT, echocardiogram, lower extremity Dopplers, as well as esophagogastroduodenoscopy and US of the gallbladder.  Additionally, the patient was also seen in consultation by Dr. Maple Hudson, who felt that the patient might have symptoms suggestive of a viral syndrome and/or tracheal bronchitis.  The patient subsequently returned to [facility] emergency room for recurrent symptoms and was diagnosed with costochondritis and released.  She also presented to [facility] ER, again, undergoing spiral CT which was negative for pulmonary embolus and being released with instructions for follow up in the pulmonary clinic.  However, the patient returned to our emergency room stating that she was unable to wait for the scheduled follow up with the [facility]. She continued to have the same symptoms as she presented during her previous hospitalization.  She intimated that she would not completely satisfied, that her symptoms were not cardiac in origin until undergoing a definitive coronary angiogram.  LABORATORY DATA:  CPK/MB negative x 2; Troponin I less than 0.03.  CBCs within normal limits.  Metabolic profile within normal  limits.  Lipid profile:  Cholesterol 125, triglycerides 84, HDL 30, LDL 78 (cholesterol/HDL ratio 4.2).  HOSPITAL COURSE:  Following admission, the patient ruled out for MI with negative serial cardiac enzymes.  The patient was again seen in consultation by Dr. Melba Coon, who recommended that if the cardiac workup were negative to reassure the patient and direct her to a psychiatric therapist for evaluation. He also recommended pulmonary function tests before/after bronchodilators - this was completely normal.  Dr. Maple Hudson did also recommend considering GI evaluation for reflux/esophageal spasm.  The patient was also seen by her primary care physician, Dr. Heather Roberts, who recommended patient return to her in 1 week following discharge.  Cardiac catheterization performed on October 09, 1999, by Dr. Ermalene Postin (see catheterization report for full details), revealed 40-50% ostial ramus intermedius stenosis with otherwise normal CFX/RCA/LAD and normal LV.  Dr. Riley Kill noted that intracoronary nitroglycerin was not used to better assess this lesion secondary to the patients systolic blood pressure of approximately 100.  Dr. Riley Kill concluded that the stenoses was not likely the source of the pain.  He did recommend a Cardiolite scan to exclude ischemia, however, it was noted by Dr. Myrtis Ser that this had already been performed during the previous admission on September 16, 1999, and that it was entirely normal. Thus, no  further cardiac workup was recommended.  The patient was, however, apprised of the findings of her lipid profile with attention focused on the low HDL level, however, the LDL was less than 100.  The plan is to continue monitoring this as an outpatient.  MEDICATION ADJUSTMENTS:   No medication changes made during this brief stay.  DISCHARGE MEDICATIONS: 1. Klonipin 0.5 mg q.h.s. 2. Depakote 125 mg q.h.s. 3. Topamax 25 mg b.i.d.  DISCHARGE INSTRUCTIONS:  ACTIVITY:  The  patient is to refrain from heavy lifting/driving for 2 days and to maintain low fat/cholesterol diet.  WOUND CARE:  She is to call the cardiology office if there is any bleeding/swelling of the groin.  FOLLOWUP:  The patient is instructed to follow up with Dr. Heather Roberts in 1 week, with Dr. Kellie Simmering 2-3 weeks, and to also schedule a follow up appointment with her psychiatrist, Dr. Phillip Heal.  DISCHARGE DIAGNOSES: 1. Recurrent noncardiac chest pain.    a. Status post cardiac catheterization - October 09, 1999:  40% ostial       ramus intermedius; normal CFX, RCA, and LAD; normal left ventricle.    b. Status post previous normal exercise Cardiolite.    c. Normal TFTs. 2. Bipolar disorder. 3. Low HDL. DD:  10/10/99 TD:  10/10/99 Job: [id number]

## 2011-01-03 NOTE — Discharge Summary (Signed)
NAMELILLYEN, [name] NO.:  [id number]   MEDICAL RECORD NO.:  [id number]          PATIENT TYPE:  IPS   LOCATION:  0300                          FACILITY:  [facility]   PHYSICIAN:  Jasmine Pang, M.D. DATE OF BIRTH:  10/11/1960   DATE OF ADMISSION:  06/21/2008  DATE OF DISCHARGE:  06/27/2008                               DISCHARGE SUMMARY   IDENTIFICATION:  [name] who was  admitted on a voluntary basis on June 21, 2008.   HISTORY OF PRESENT ILLNESS:  The patient reports feeling very depressed.  She stated she is really down criticizing about life events wanting to  get rid of my dogs and having some passive suicidal thoughts.  She  states that over the past few months she has been having to deal with  her mother's illness.  The patient herself has also been hospitalized  for gastroparesis as they thought that they thought was due to a  medication she was on.  Also, she has had a recent relationship breakup  and also developed the flu.  She states she has been sleeping well.  Her  appetite has been satisfactory.  She does report that she is very  sensitive to medications and just small amounts can make her had  significant side effects.   PAST PSYCHIATRIC HISTORY:  The patient has been here prior with her last  admission being April 2007, where she was having some impulsive  thoughts, poor concentration, and depressed mood.  She reports she was  diagnosed with bipolar at the age of 59.  She does have a psychiatrist,  Dr. Andee Poles, and a therapist, named Stevphen Rochester.  In the past,  the patient has been on Abilify and lithium, which was discontinued due  to her gastroparesis.   FAMILY HISTORY:  None.   ALCOHOL AND DRUG HABITS:  No consistent alcohol or drug use.   MEDICAL PROBLEMS:  No acute or chronic health issues.   MEDICATIONS:  1. Klonopin 1 mg at bedtime.  2. Estradiol daily.  3. Wellbutrin 150 mg daily.   DRUG  ALLERGIES:  No known drug allergies.   PHYSICAL EXAM:  The patient was fully assessed at [facility] Emergency  Department.  There were no acute physical or medical problems noted.  She was in no acute distress.   LABORATORY DATA:  Urine drug screen was negative.  Urinalysis was  negative.  Alcohol level was less than 5.  CBC was within normal limits.  C-MET was within normal limits.   HOSPITAL COURSE:  Upon admission, the patient was started on Ambien 10  mg p.o. q.h.s. p.r.n. insomnia and Klonopin 1 mg p.o. q.h.s., estradiol  1 mg p.o. q.h.s. and Wellbutrin 150 mg p.o. q.a.m.  She was also started  on Depakote ER 250 mg p.o. q.h.s.  On June 23, 2008, Depakote ER was  increased to 500 mg p.o. q.h.s.  and Wellbutrin XL was increased 300 mg  p.o. q.day.  In individual sessions with me, the patient was friendly  and cooperative.  She also participated appropriately  in unit  therapeutic groups and activities.  Therapeutic issues revolved around  her stressors.  These include her breakup with the significant other,  mother's illness, which recently became worse, and recent  hospitalization for gastroparesis.  She states she has been diagnosed  with bipolar disorder.  No drug or alcohol abuse.  On June 23, 2008,  mood was still depressed and anxious.  She still felt she had mood  swings.  At this point, the Depakote was increased as indicated above  and Wellbutrin was increased as indicated above.  She began to feel less  depressed and less anxious.  Felt ready for discharge soon.  However,  she became too activated on the Wellbutrin XL 300 mg and this was  decreased back to 150 mg p.o. q.h.s.  On June 26, 2008, she was  nauseated the night before and also dizzy.  She was having some  dysphoria.  Depakote was held and then discontinued as it was felt this  was causing nausea and dizziness.  On June 27, 2008, mental status  had improved markedly from admission status.  The  patient did admit some  anger because of a conversation with her sister the night before.  She  was upset because her sister said she did not want to have anything to  do with her and hung up on her.  However, mood was improved from  admission status.  Affect was consistent with mood.  There was no  suicidal or homicidal ideation.  No thoughts of self-injurious behavior.  No auditory or visual hallucinations.  No paranoia or delusions.  Thoughts were logical and goal-directed.  Thought content.  Angry at her  sister.  Cognitive was grossly intact.  Insight was fair.  Judgment was  fair.  Impulse control was good.  She was able to contract for safety  and did not feel she would hurt herself even though she was disappointed  with the conversation with her sister.   DISCHARGE DIAGNOSES:  Axis I:  Bipolar disorder, not otherwise  specified.  Axis II: Deferred.  Axis III:  No known medical conditions.  Axis IV:  Psychosocial problems related to burden of illness, problems  with primary support group, occupational, medical problems. Axis V:  Global assessment of functioning was 15 upon discharge.  GAF was 35 to  40 upon admission.  GAF was 60 to 65 highest past year.   DISCHARGE PLANS:  There was no specific activity level or dietary  restrictions.   POSTHOSPITAL CARE PLANS:  The patient will see Dr. Andee Poles on  November 23rd at 11:15 a.m.   DISCHARGE MEDICATIONS:  1. Clonazepam 1 mg at bedtime.  2. Estradiol 1 mg at bedtime.  3. Wellbutrin XL 150 mg daily.  4. She was also started on Lamictal 12.5 mg daily to be taken at      night.  5. She has been on Lamictal and the past and states other than      sedation, she tolerated this well.  If she took it at night, the      sedation would not be a problem.      Jasmine Pang, M.D.  Electronically Signed     B[facility]/MEDQ  D:  06/29/2008  T:  06/30/2008  Job:  [id number]

## 2011-01-03 NOTE — H&P (Signed)
[facility]  Patient:    [name], [name] Visit Number: [id number] MRN: [id number]          Service Type: OBV Location: 9300 [id number] 01 Attending Physician:  Sharon Mt Dictated by:   Rande Brunt. Eda Paschal, M.D. Admit Date:  03/11/2002 Discharge Date: 03/12/2002                           History and Physical  CHIEF COMPLAINT:              Pelvic pain and persistent left adnexal mass.  HISTORY OF PRESENT ILLNESS:   The patient is a 50 year old nulligravida who came to see me in March 2003 with a history of having pelvic pain in her left ovary over the past several months.  At that time she was also having symptoms of gallbladder disease and she underwent a laparoscopic cholecystectomy by Dr. Magnus Ivan.  Prior to that she had had an ultrasound of her pelvis showing a left ovarian cyst of 4.4 x 4 x 3.4 cm with mixed echogenic qualities and some septations consistent with a complex cyst.  It was felt that it was better for her to proceed with her surgery and then follow-up ultrasound to see if the above would resolve.  She did well after her cholecystectomy and then she returned for follow-up ultrasound in April.  At this time the cyst which had been complex became simple.  It had decreased in size to 3 cm.  She had a normal CA-125 and it was elected to continue to watch it.  She has continued to have severe left lower quadrant pain.  She was rescanned in June 2003.  The cyst has now increased in size to almost 4 cm.  It has become complex again, it has septations.  It also has an irregular shaped nodule within the cyst. She has a history of endometriosis and is status post TAH, right S&O for that, and it is felt that this certainly could be related to that.  She has also seen Dr. Loreta Ave who is her gastroenterologist.  She underwent a colonoscopy this week which failed to explain the pain in the left lower quadrant.  Dr. Loreta Ave feels that it is due  to the persistent left ovarian cyst.  She also underwent a CT of her abdomen and her pelvis which was completely normal except for an enlargement of her left adnexa consistent with what was seen before.  It is described on CT as a 3.3 x 5.2 cm cystic lesion with an adjacent 2.2 x 1.5 cm cystic lesion.  She now enters the hospital for laparoscopy and removal of her left ovary and tube because of persistence of a complex cyst, as well as pelvic pain.  She understands the issues of requiring hormone replacement if she is symptomatic after the surgery, but is tired of having this severe pain and with her history of endometriosis feels that this is the best solution as she has had other recurring ovarian cysts as well.  She understands that we may not be able to do the surgery laparoscopically and has given me permission to do a laparotomy if necessary to accomplish the above.  PAST MEDICAL HISTORY:         The patient is status post TAH, right S&O done in 2000 for endometriosis; laparoscopic cholecystectomy done earlier in 2003.  PRESENT MEDICATIONS:          Lithium  and klonopin.  ALLERGIES:                    MORPHINE.  FAMILY HISTORY:               Reveals a father with hypertension.  Paternal grandfather and maternal grandmother with coronary artery disease.  SOCIAL HISTORY:               She is a psychiatric nurse.  She uses alcohol occasionally.  She does not smoke.  REVIEW OF SYSTEMS:            HEENT:  Basically negative.  CARDIAC:  Negative except for a history of chest pains and shortness of breath without coronary artery disease.  RESPIRATORY:  Negative.  GI:  Strongly positive for irritable bowel syndrome and symptoms related to that; see above as well.  GU: Negative.  NEUROMUSCULAR:  Negative.  ALLERGIC:  Negative.  PHYSICAL EXAMINATION:  GENERAL:                      The patient is a well-developed and well-nourished female in no acute distress.  VITAL SIGNS:                   Blood pressure 130/70, pulse 80 and regular, respirations 16 and nonlabored.  She is afebrile.  HEENT:                        Within normal limits.  NECK:                         Supple.  Trachea in the midline, thyroid not enlarged.  LUNGS:                        Clear to P&A.  HEART:                        No thrills, heaves, or murmurs.  BREASTS:                      No masses.  ABDOMEN:                      Soft without guarding, rebound, or masses.  PELVIC:                       External and vaginal within normal limits.  Pap smear shows no atypia.  Bimanual and rectal fails to reveal the mass seen on CT and ultrasound but she is very tender in her left adnexa.  Rectovaginal is confirmatory.  ADMISSION IMPRESSION:         1. Persistent left ovarian cyst, complex.                               2. Persistent pelvic pain.  PLAN:                         Diagnostic laparoscopy with left S&O. Dictated by:   Rande Brunt. Eda Paschal, M.D. Attending Physician:  Sharon Mt DD:  03/11/02 TD:  03/12/02 Job: [id number]

## 2011-01-03 NOTE — Discharge Summary (Signed)
   NAME:  [name], [name]                        ACCOUNT NO.:  [id number]   MEDICAL RECORD NO.:  [id number]                   PATIENT TYPE:  OBV   LOCATION:  9306                                 FACILITY:  [facility]   PHYSICIAN:  Daniel L. Eda Paschal, M.D.           DATE OF BIRTH:  [birth year]   DATE OF ADMISSION:  03/11/2002  DATE OF DISCHARGE:  03/12/2002                                 DISCHARGE SUMMARY   HOSPITAL COURSE:  The patient is a 50 year old female with persistent left  lower quadrant pain and persistent complex cyst of the left ovary, who  entered the hospital for surgery.  On the day of admission she was taken to  the operating room, laparoscopy confirmed an ovarian cyst with dense pelvic  adhesive disease.  Lysis of adhesions and left salpingo-oophorectomy was  done through the laparoscope.  She was kept overnight for observation, on  the next morning was doing well.  She was voiding, passing gas, and was  ready for discharge.   DISCHARGE INSTRUCTIONS:  Discharge diet was regular.  Discharge activity was  ambulatory.   DISCHARGE MEDICATIONS:  Vicodin on a p.r.n. need.   PATHOLOGY:  The final pathology report is not available at the time of  dictation.   FOLLOW-UP:  She will return to my office in three weeks for follow-up.   DISCHARGE DIAGNOSES:  1. Chronic left lower quadrant pain.  2. Persistent complex left ovarian cyst.   PROCEDURE:  Diagnostic laparoscopy with lysis of pelvic adhesions and left  salpingo-oophorectomy.                                                 Daniel L. Eda Paschal, M.D.    [name]  D:  03/12/2002  T:  03/20/2002  Job:  [id number]

## 2011-01-03 NOTE — Procedures (Signed)
[place]  Patient:    [name], [name] Visit Number: [id number] MRN: [id number]          Service Type: END Location: ENDO Attending Physician:  Charna Elizabeth Dictated by:   Anselmo Rod, M.D. Proc. Date: 03/08/02 Admit Date:  03/08/2002   CC:         Reuel Boom L. Eda Paschal, M.D.  Shelly ___________, M.D.   Procedure Report  PROCEDURE:  Colonoscopy.  DATE OF BIRTH:  [birth year]  ENDOSCOPIST:  Anselmo Rod, M.D.  INSTRUMENT USED:  Olympus video colonoscope.  INDICATIONS FOR PROCEDURE:  A 50 year old white female with a history of epigastric, periumbilical and left lower quadrant pain. A large adnexal mass has been seen on CT. The patient has also had some thickening of the colon seen on CT; therefore, a preoperative colonoscopy is being done to rule out colitis. The patient has had severe diarrhea in the recent past.  PREPROCEDURE PREPARATION:  Informed consent was procured from the patient. The patient fasted for eight hours prior to the procedure and prepped with a bottle of magnesium citrate and a gallon of NuLYTEly the night prior to the procedure.  PREPROCEDURE PHYSICAL:  VITAL SIGNS:  Stable. NECK:  Supple. CHEST:  Clear to auscultation. S1, S2 regular. ABDOMEN:  Soft with normal bowel sounds.  DESCRIPTION OF PROCEDURE:  The patient was placed in the left lateral decubitus position and sedated with Demerol and Versed for the EGD. No additional sedation was used for the colonoscopy. Once the patient was adequately sedated and maintained on low flow oxygen and continuous cardiac monitoring, the Olympus video colonoscope was advanced under direct vision to the cecum in the terminal ileum without difficulty. There was some residual stool in the colon and multiple washings were done. The entire colonic mucosa and the terminal ileum appeared normal. No masses, polyps, erosions, ulcers, diverticula or inflammation was seen. The  patient tolerated the procedure well without complications.  IMPRESSION:  Normal colonoscopy to the terminal ileum.  RECOMMENDATIONS: 1. Proceed with a colonoscopy on March 11, 2002 with Dr. Edyth Gunnels for    removal of the left adnexal mass. 2. Outpatient follow-up within the next 4-6 weeks after surgery. Dictated by:   Anselmo Rod, M.D. Attending Physician:  Charna Elizabeth DD:  03/08/02 TD:  03/08/02 Job: [id number]

## 2011-01-03 NOTE — Op Note (Signed)
[place]  Patient:    [name], [name] Visit Number: [id number] MRN: [id number]          Service Type: DSU Location: 5700 5735 01 Attending Physician:  Shelly Rubenstein Dictated by:   Abigail Miyamoto, M.D. Proc. Date: 11/08/01 Admit Date:  11/08/2001 Discharge Date: 11/09/2001                             Operative Report  PREOPERATIVE DIAGNOSIS:  Biliary dyskinesia.  POSTOPERATIVE DIAGNOSIS:  Biliary dyskinesia.  OPERATION PERFORMED:  Laparoscopic cholecystectomy.  SURGEON:  Abigail Miyamoto, M.D.  ASSISTANT:  Gita Kudo, M.D.  ANESTHESIA:  General endotracheal anesthesia and 0.25% Marcaine.  ESTIMATED BLOOD LOSS:  Minimal.  INDICATIONS FOR PROCEDURE:  [name] is a female who presented with abdominal pain.  She has had extensive work-up and has had a HIDA scan showing her to have a 17% ejection fraction.  Ultrasound was otherwise unremarkable. She did have an ultrasound of the pelvis which showed a cystic structure in the left adnexa.  OPERATIVE FINDINGS:  The patient was found to have a normal cholangiogram. Examination of the pelvis failed to reveal any abnormalities.  The ovary could not be identified.  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  She was placed supine on the operating table and general anesthesia was induced.  Her abdomen was then prepped and draped in the usual sterile fashion.  Using a #15 blade a small transverse incision was made below the umbilicus.  The incision was carried down to the fascia which was then opened with a scalpel.  A hemostat was then used to pass into the peritoneal cavity.  A 0 Vicryl pursestring suture was placed around the fascial opening. The Hasson port was placed through the opening and insufflation was  begun. An 11 mm port was placed in the patients epigastrium and two 5 mm ports were placed in the right  flank under direct vision.  The gallbladder was then grasped and retracted above the liver bed.  Several adhesions to the gallbladder were taken down bluntly.  The cystic duct was then identified and clipped once distally and partly transected with scissors.  A cholangiocath was inserted in the right upper quadrant through an angiocath.  The cholangiocath was then placed into the cystic stump and a cholangiogram was performed under fluoroscopy.  Good flow of contrast was seen into the proximal biliary radicals as well as into the distal common bile duct and duodenum.  No abnormality was identified.  The cholangiocath was then removed.  The cystic stump was then clipped three times proximally and once distally and transected with the scissors.  The cystic artery was then identified and clipped twice proximally and once distally and transected as well.  The gallbladder was then dissected free from the liver bed with the cautery.  Once the gallbladder was excised, it was removed through the port at the umbilicus.  Next, the patient was placed in Trendelenburg position and the pelvis was examined.  The patient did have free fluid in the pelvis.  The rectum and sigmoid colon were easily identified.  The fallopian tube appeared to be slightly adherent to the sigmoid colon and the ovary on the left side could not be identified.  There was a minimal amount of gelatinous debris in the pelvis but no other abnormalities were identified.  At this point all  ports were removed under direct vision and the abdomen was deflated.  Prior to deflation the 0 Vicryl at the umbilicus was then tied in place closing the fascial defect.  All skin incisions were then anesthetized with 0.25% Marcaine and then closed with 4-0 Monocryl subcuticular sutures.  Steri-Strips, gauze and tape were then applied.  The patient tolerated the procedure well.  All sponge, needle and instrument counts were correct at the end of the  procedure.  The patient was then extubated in the operating room and taken in stable condition to the recovery room. Dictated by:   Abigail Miyamoto, M.D. Attending Physician:  Shelly Rubenstein DD:  11/08/01 TD:  11/09/01 Job: [id number]

## 2011-01-03 NOTE — Op Note (Signed)
NAMECELINA, [name]              ACCOUNT NO.:  [id number]   MEDICAL RECORD NO.:  [id number]          PATIENT TYPE:  AMB   LOCATION:  ENDO                         FACILITY:  [facility]   PHYSICIAN:  Anselmo Rod, M.D.  DATE OF BIRTH:  02-Feb-1961   DATE OF PROCEDURE:  07/21/2005  DATE OF DISCHARGE:                                 OPERATIVE REPORT   PROCEDURE PERFORMED:  Esophagogastroduodenoscopy.   ENDOSCOPIST:  Anselmo Rod, M.D.   INSTRUMENT USED:  Olympus video panendoscope.   INDICATIONS FOR PROCEDURE:  A 50 year old white female with a history of  epigastric pain.  Patient has had elevated lithium levels in the recent  past.  Rule out peptic ulcer disease, esophagitis, gastritis, etc.   PREPROCEDURE PREPARATION:  Informed consent was procured from the patient.  The patient fasted for eight hours prior to the procedure.   PREPROCEDURE PHYSICAL EXAMINATION:  VITAL SIGNS:  Stable.  NECK:  Supple.  CHEST:  Clear to auscultation.  CARDIOVASCULAR:  S1 and S2 regular.  ABDOMEN:  Soft with normal bowel sounds.   DESCRIPTION OF PROCEDURE:  The patient was placed in left lateral decubitus  position, sedated with 60 mg of Demerol and 8  mg of Versed in slow  incremental doses.  Once the patient was adequately sedated and maintained  on low flow oxygen and continuous cardiac monitoring, the Olympus video  panendoscope was advanced through the mouthpiece over the tongue and into  the esophagus under direct vision.  The entire esophagus appeared normal  with no evidence of ring, stricture, masses, esophagitis or Barrett's  mucosa.  The scope was then advanced in the stomach.  A small hiatal hernia  was seen on high retroflexion.  The rest of the gastric mucosa and proximal  small-bowel appeared normal.   IMPRESSION:  Normal esophagogastroduodenoscopy except for small hiatal  hernia.   RECOMMENDATIONS:  1.Continue PPIs.  2.Outpatient follow-up as need arises in the future.  3.Avoidance of nonsteroidals has been encouraged.      Anselmo Rod, M.D.  Electronically Signed     JNM/MEDQ  D:  07/21/2005  T:  07/22/2005  Job:  [id number]   cc:   Neta Mends. Fabian Sharp, M.D. [facility]  [address] [place]  [place] [address]   Andee Poles, M.D.  Fax: [phone]

## 2011-01-03 NOTE — H&P (Signed)
[facility]  Patient:    [name], [name]                   MRN: [id number] Adm. Date:  [id number] Disc. Date: [id number] Attending:  Rachael Fee Dictator:   Young Berry Lorin Picket, R.N., F.N.P.                   Psychiatric Admission Assessment  DATE OF ADMISSION:  February 07, 2001  PATIENT IDENTIFICATION:  This is a 50 year old divorced Caucasian female who is a voluntary admission for increased depression over the past two months.  HISTORY OF PRESENT ILLNESS:  The patient reports increased problems with concentration, for thought clarity, forgetfulness, and struggling to keep details straight at work, these symptoms occurring for about the past two months as her depression increases with symptoms markedly becoming worse over the previous two weeks prior to admission.  The patient reports increased feelings of depression with increased sadness and crying, decreased appetite with weight loss of 10 pounds over the past two weeks, sleep satisfactory, some suicidal ideation but no intent or plan.  She denies any auditory or visual hallucinations although she has had these symptoms in the distant past with increases in her depression.  She denies any feelings of paranoia.  She cites social stressors of increased pressure on her job, increased concerns about the health and well-being of her mother, splitting up with a house mate who moved out recently, and the death of a close friend.  The patient felt over this previous weekend that her depression was very unstable, that her mood was up and down and she felt that it was getting out of control and she feared that she would become psychotic as she had in the past.  She denies any feelings of suicidal ideation or homicidal ideation today but has had these feelings in the previous two weeks.  PAST PSYCHIATRIC HISTORY:  The patient is followed as an outpatient by Dr. Geoffery Lyons and is current with regular  appointments at this time.  She also sees Doran Heater, her psychotherapist, on a regular basis.  She has a history of inpatient [facility] at [facility] approximately one year ago.  She has also been admitted to [facility] two times previous; also previous admissions to [facility] and [facility].  She was diagnosed with bipolar illness in 56 at age 65 and she reports that she feels that her depression has increased as she gradually becomes older.  SUBSTANCE ABUSE HISTORY:  The patient uses ETOH occasionally, denies any use of illegal drugs or any abuse of prescription drugs.  PAST MEDICAL HISTORY:  The patient is followed by Dr. Heather Roberts. Medical problems: The patient is status post LASIK surgery approximately two weeks ago; she is otherwise healthy.  She is status post hysterectomy some years past secondary to endometriosis.  MEDICATIONS:  Neurontin 300 mg six times daily, Wellbutrin SR 100 mg p.o. b.i.d., Klonopin 0.5 mg p.o. q.a.m. and q.h.s., Pred Forte and Ocuflox eye drops daily.  DRUG ALLERGIES:  None.  PHYSICAL EXAMINATION:  Pending.  VITAL SIGNS: On admission, temperature 98.6, pulse 84, respirations 16, blood pressure 109/70 sitting and 111/69 standing.  Baseline weight, she reports, is 125 pounds; on admission her weight was 115 pounds.  LABORATORY DATA:  CMET and CBC were within normal limits.  Thyroid is currently pending.  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  She has recently purchased her own home where she lives alone. Her mother lives in [place] and is currently moving to a retirement home and the patient assists her mother with her moving arrangements and is one of her primary helpers.  The patient reports that she does have supportive sisters and friends.  She denies any legal or financial problems.  She does report increased work  stress because of patient care load on the job.  Family history is positive for a grandmother with bipolar illness and a father with alcohol abuse and depression and a sister with depression.  MENTAL STATUS EXAMINATION:  Casually dressed, appropriately groomed Caucasian female who is relaxed and cooperative.  Affect is appropriate.  Eye contact is good.  Speech is normal in pace and tone, spontaneous, no pressure noted. Mood is depressed and sad.  Thought process is logical and coherent.  No suicidal ideation or homicidal ideation today, no evidence of auditory or visual hallucinations, no psychotic symptoms.  Cognitive: Oriented x 3 and intact.  ADMISSION DIAGNOSES: Axis I:    Bipolar disorder, currently depressed. Axis II:   Deferred. Axis III:  Status post LASIK surgery both eyes. Axis IV:   Deferred. Axis V:    Current 36, past year 65.  INITIAL PLAN OF CARE:  Admit the patient to stabilize her mood.  Q.52m. checks are in place.  Goal is to alleviate her suicidal ideation and stabilize her mood and increase her coping skills.   We will start her on Risperdal 0.125 mg p.o. b.i.d.  We will increase her Wellbutrin SR to 150 mg p.o. b.i.d. and encourage her to participate in groups.  ESTIMATED LENGTH OF STAY:  Two to four days. DD:  02/09/01 TD:  02/09/01 Job: [id number]

## 2011-01-03 NOTE — Assessment & Plan Note (Signed)
[facility]                                 ON-CALL NOTE   [name], [name]                     MRN:          [id number]  DATE:12/21/2009                            DOB:          Sep 23, 1960    [name] called, complaining of diarrhea and abdominal pain.  This  has been a problem for over a month for which she saw Dr. Elnoria Howard,  yesterday.  Diarrhea and lower abdominal cramping pain have continued.  I instructed her to begin HyoMax as needed for her lower abdominal pain.  This medicine was called in.  She was instructed to follow up with Dr.  Loreta Ave early next week when she returns to town.     Barbette Hair. Arlyce Dice, MD,FACG     RDK/MedQ  DD: 12/21/2009  DT: 12/22/2009  Job #: [id number]   cc:   Anselmo Rod, M.D.

## 2011-01-03 NOTE — Discharge Summary (Signed)
[name], [name] NO.:  [id number]   MEDICAL RECORD NO.:  [id number]          PATIENT TYPE:  IPS   LOCATION:  0301                          FACILITY:  [facility]   PHYSICIAN:  Geoffery Lyons, M.D.      DATE OF BIRTH:  February 22, 1961   DATE OF ADMISSION:  11/19/2005  DATE OF DISCHARGE:  11/22/2005                                 DISCHARGE SUMMARY   CHIEF COMPLAINT AND PRESENT ILLNESS:  This was the second admission to [facility] for this 50 year old single white female  voluntarily admitted.  History of depression, suicidal ideation, plan to  overdose, feeling down, having relationship problems.  New job, overwhelmed,  gaining six pounds, sleeping well, no psychosis, compliant with medications.  Some problem with long-term memory.   PAST PSYCHIATRIC HISTORY:  Second time at [facility], seeing  Dr. Andee Poles.   ALCOHOL AND DRUG HISTORY:  Denies any active use of alcohol or drugs.   PAST MEDICAL HISTORY:  Noncontributory.   MEDICATIONS:  1.  Lithium 300 in the morning and 600 at bedtime.  2.  Klonopin 1 mg q.6h. p.r.n.  3.  Wellbutrin SR 150 mg per day.  4.  Estradiol 0.15 mg daily.  5.  Klonopin 1 mg at night, 0.5 twice a day.   Physical examination performed and failed to show any acute findings.   LABORATORY DATA:  CBC with wbc 7.1, hemoglobin 13.1. Blood chemistry with  sodium 141, potassium 3.7, glucose 132.  Liver enzymes with SGOT 21, SGPT  19, total bilirubin 0.6.  TSH 5.639.  Lithium level 0.81.   MENTAL STATUS EXAM:  A female cooperative, good eye contact.  Casually  dressed.  Speech clear, no normal rate and production.  Mood depressed.  Affect constricted.  Thought process __________ coherent and relevant.  No  delusions, no hallucinations.  Cognition well preserved.   ADMISSION DIAGNOSES:  AXIS I.  Bipolar disorder, depression.  AXIS II.  No diagnosis.  AXIS III.  No diagnosis.  AXIS IV.  Moderate.  AXIS  V.  Upon admission 35, highest GAF in the last year 75 to 80.   HOSPITAL COURSE:  She was admitted.  She was started in individual and group  psychotherapy.  She was maintained on her medication.  Lithium was placed at  300 twice a day and 600 at bedtime and she was started on Wellbutrin SR 150  twice a day.  She endorsed increased stress, confident in the relationship  with the same-sex partner, stress from work, multiple job changes, increased  signs and symptoms of depression, unable to get herself out of this  depressed mood, a sense of hopelessness and helplessness, and overwhelmed.  Suicidal ideation although could contract for safety.  Did better on the  Wellbutrin.  Claims the Wellbutrin got her off depression, so we went ahead  increased her Wellbutrin to 150 twice a day. There was a family session with  her partner.  They were able to address some issues.  There was some anxiety  building up to the family session but she  was able to hand it appropriately  and by November 22, 2005, she was much better.  There were no suicidal or  homicidal ideas, no hallucinations, no delusions.  Willing and motivated to  pursue further outpatient treatment.  Will pursue that relationship further,  identifying the sense that they needed to address committed to her getting  adjusted to the new job and make it work for her.   DISCHARGE DIAGNOSES:  AXIS I.  Bipolar disorder, depressed.  AXIS II.  No diagnosis.  AXIS III.  No diagnosis.  AXIS IV.  Moderate.  AXIS V.  Upon discharge 55 to 60.   DISCHARGE MEDICATIONS:  1.  Ambien 10 at night for sleep.  2.  Lithium 300 one twice a day and two at night.  3.  Wellbutrin SR 150 twice a day.  4.  Klonopin 0.5 twice a day and two at bedtime.   FOLLOW UP:  Andee Poles, M.D., and Mariane Masters.      Geoffery Lyons, M.D.  Electronically Signed     IL/MEDQ  D:  12/02/2005  T:  12/03/2005  Job:  [id number]

## 2011-01-03 NOTE — H&P (Signed)
[name], [name] NO.:  [id number]   MEDICAL RECORD NO.:  [id number]          PATIENT TYPE:  IPS   LOCATION:  0301                          FACILITY:  [facility]   PHYSICIAN:  Geoffery Lyons, M.D.      DATE OF BIRTH:  1961/05/07   DATE OF ADMISSION:  11/19/2005  DATE OF DISCHARGE:  11/22/2005                         PSYCHIATRIC ADMISSION ASSESSMENT   IDENTIFYING INFORMATION:  [name]  voluntarily admitted on November 19, 2005.   HISTORY OF PRESENT ILLNESS:  The patient presents with a history of  depression with suicidal thoughts with plans to overdose.  The patient  states that she has been feeling down.  She has been having relationship  problems with same-sex partner.  She is also reporting stress with her new  job, feeling very overwhelmed.  The patient states she has gained about six  pounds.  She has been sleeping satisfactorily.  Denies any psychotic  symptoms.  Has been compliant with her medications and is reporting some  problems with her long-term memory.   PAST PSYCHIATRIC HISTORY:  Second admission to [facility].  Sees Dr. Andee Poles for outpatient mental health services.   SOCIAL HISTORY:  [name] with no children.  She lives alone.  She has completed college.  She works as an Astronomer. doing  Engineer, drilling for [employer].   FAMILY HISTORY:  Father with depression.  Sister with depression.   ALCOHOL/DRUG HISTORY:  Nonsmoker.  Occasional alcohol use.  No drug use.   PRIMARY CARE PHYSICIAN:  Dr. Burna Mortimer __________ and sees Dr. Saralyn Pilar at  [facility].   MEDICAL PROBLEMS:  The patient reports she is healthy.  No chronic medical  problems.  Is reporting some problems recently with feelings of having some  abdominal bloating.   REVIEW OF SYSTEMS:  The patient denies any chest pain, shortness of breath,  problems with nausea, problems with diarrhea, stress  incontinence.  Denies  any falls, seizures, visual problems, headaches.  Positive for depression  and suicidal thoughts.   PHYSICAL EXAMINATION:  VITAL SIGNS:  Temperature 98.2, heart rate 72,  respirations 18, blood pressure 128/83, height 5 feet tall, weight 138  pounds.  GENERAL:  This is an overweight healthy-appearing female in no acute  distress.  NECK:  Negative lymphadenopathy.  CHEST:  Clear.  BREASTS:  Exam is deferred.  HEART:  Regular rate and rhythm.  ABDOMEN:  Soft, nontender.  GU:  Exam is deferred.  EXTREMITIES:  The patient moves all extremities.  No clubbing.  No  deformities.  Strong and 5+ against resistance.  SKIN:  Warm and dry without rashes or lacerations.  NEUROLOGIC:  Findings are intact and nonfocal.   LABORATORY DATA:  TSH is 5.639.  CMET within normal limits.  Urinalysis  negative.  Lithium level is 0.81.  CBC within normal limits.   MEDICATIONS:  The patient has been on lithium 300 mg in the morning, 600 mg  at bedtime, Klonopin 1 mg q.6h. p.r.n., Wellbutrin SR 150 mg daily,  estradiol 0.5 mg daily,  Klonopin 1 mg at bedtime, 0.5 mg b.i.d. p.r.n.   MENTAL STATUS EXAM:  This is a middle-aged female.  Cooperative.  Good eye  contact.  Casually dressed.  Speech is clear, normal rate and tone.  Mood is  depressed.  The patient is flat.  Smiles at times.  Pleasant.  Thought  processes are coherent.  There is no evidence of psychosis.  Cognitive  function is intact.  Memory is good.  Judgment is fair.  Insight is fair.  Concentration intact.  Average to above average intelligence.   DIAGNOSES:  AXIS I:  Bipolar disorder, depressed.  AXIS II:  Deferred.  AXIS III:  No chronic health problems.  AXIS IV:  Problems with primary support group, occupational, other  psychosocial problems.  AXIS V:  Current 40.   PLAN:  To stabilize mood and thinking.  Contract for safety.  Will continue  to monitor lithium level and other labs as indicated.  The patient is  to  increase coping skills.  The patient is to follow up with Dr. Nolen Mu.  Continue to be medication compliant.   TENTATIVE LENGTH OF STAY:  Four to five days.      Landry Corporal, N.P.      Geoffery Lyons, M.D.  Electronically Signed    JO/MEDQ  D:  11/21/2005  T:  11/23/2005  Job:  [id number]

## 2011-01-03 NOTE — Op Note (Signed)
[name], [name]              ACCOUNT NO.:  [id number]   MEDICAL RECORD NO.:  [id number]          PATIENT TYPE:  AMB   LOCATION:  ENDO                         FACILITY:  [facility]   PHYSICIAN:  Anselmo Rod, M.D.  DATE OF BIRTH:  1961-08-06   DATE OF PROCEDURE:  11/18/2006  DATE OF DISCHARGE:                               OPERATIVE REPORT   PROCEDURE:  Colonoscopy with rectal biopsies.   ENDOSCOPIST:  Anselmo Rod, M.D.   INSTRUMENT USED:  Pentax video colonoscope.   INDICATIONS FOR PROCEDURE:  50 year old white female with a history of  change in bowel habits and rectal bleeding, rule out colonic polyps,  masses, etc.   PREPROCEDURE PREPARATION:  Informed consent was obtained from the  patient.  The patient was fasted for eight hours prior to the procedure  and prepped with a gallon of TriLyte the night prior to the procedure.  The risks and benefits of the procedure including a 10% miss rate of  cancer and polyps were discussed with the patient, as well.   PREPROCEDURE PHYSICAL:  Patient with stable vital signs.  Neck supple.  Chest clear to auscultation.  S1 and S2 regular.  Abdomen soft with  normal bowel sounds.   DESCRIPTION OF PROCEDURE:  The patient was placed in the left lateral  decubitus position, sedated with 125 mcg Fentanyl and 12.5 mg Versed  given intravenously in slow incremental doses.  Once the patient was  adequately sedated, maintained on low flow oxygen and continuous cardiac  monitoring, the Pentax video colonoscope was advanced from the rectum to  the cecum.  There was some residual stool in the colon.  There was  evidence of pandiverticulosis.  Mild inflammatory changes were noted in  the rectum.  Biopsies were done to rule out proctitis.  No other masses  or polyps were seen.  The appendiceal orifice and ileocecal valve were  clearly visualized and photographed.  The terminal ileum appeared  healthy and without lesions.  Multiple washes were  done.  Retroflexion  in the rectum revealed no abnormalities.  The patient tolerated the  procedure well without immediate complications.   IMPRESSION:  1. Mild inflammatory changes in the rectum, biopsies done to rule out      proctitis.  2. Pandiverticulosis.  3. No masses or polyps seen.  4. Normal terminal ileum.   RECOMMENDATIONS:  1. Await pathology results.  2. Increase fluid and fiber in the diet.  3. Avoid all non-steroidals for the next two weeks.  4. Outpatient follow up in the next two weeks for further      recommendations.      Anselmo Rod, M.D.  Electronically Signed     JNM/MEDQ  D:  11/18/2006  T:  11/18/2006  Job:  [id number]   cc:   Neta Mends. Fabian Sharp, MD

## 2011-01-03 NOTE — Discharge Summary (Signed)
[facility]  Patient:    [name], [name]                   MRN: [id number] Adm. Date:  [id number] Disc. Date: [id number] Attending:  Geoffery Lyons A                           Discharge Summary  CHIEF COMPLAINT AND HISTORY OF PRESENT ILLNESS::  This is the second admission to [name] for this 50 year old female admitted with increased depression over the past two months, increased problem with concentration, thought clarity, forgetfulness, and problems to keep details straight at work. These symptoms are occurring for about the past two months as her depression increases with symptoms becoming worse over the previous two weeks prior to this admission.  Reports increased feelings of depression with increased sadness and crying, decreased appetite, weight loss of 10 pounds over the past two weeks.  Sleep is preserved.  Suicidal ideas with no intent or plan.  No auditory or visual hallucinations.  Denies any feelings of paranoia.  Cites social stressors of increased pressure on her job, increased concern about the health and well-being of her mother, putting up with a house maid who moved out recently, and the death of a close friend.  The patient felt over this previous weekend that her mood was going down on her, very unstable, and she felt that she needed help.  PAST PSYCHIATRIC HISTORY:  Outpatient treatment, diagnosed bipolar in 39 at age 17.  She feels that her depression has been the most predominant mood in the last years as she becomes older.  SUBSTANCE ABUSE HISTORY:  She denies use or abuse of any substances.  PAST MEDICAL HISTORY:  Had LASIK surgery for her eye sight.  MEDICATIONS: 1. Neurontin 300 mg 6 times daily. 2. Wellbutrin slow release 100 mg twice a day. 3. Klonopin 0.5 in the morning and at bedtime. 4. Pred Forte. 5. Ocuflox eye drops.  MENTAL STATUS EXAMINATION: Reveals well-nourished, well-developed,  alert, cooperative female.  Mood is depressed.  Affect is depression.  Speech is normal pace and tone, spontaneous, no pressure.  Though process is logical and coherent.  No suicidal ideas or homicidal ideation.  No evidence of auditory or visual hallucinations.  No psychotic symptoms.  Cognition well preserved.  ADMISSION DIAGNOSES: Axis I:    Bipolar disorder, depressed. Axis II:   Deferred. Axis III:  Status post LASIK surgery, both eyes. Axis IV:   Moderate. Axis V:    Upon admission 35 to 40, highest in the last year 85.  COURSE IN THE HOSPITAL:  She was admitted and started intensive individual and group psychotherapy.  She felt that just being in the hospital with the setting and the protection provided, she started feeling better.  She was given her medications.  She does recognize that she was running out of Wellbutrin, and she started taking less of the Wellbutrin.  It also means that her stress at work, working with Hospice and issues with the relationship were the main fear.  On February 09, 2001, feeling in full contact with reality.  Her mood had improved.  Her affect was bright.  She denied any suicidal ideations, felt that she was back on track.  She requested discharge, and discharge was granted to pursue followup on an outpatient basis.  DISCHARGE DIAGNOSES: AXIS I.   Bipolar disorder, depressed. AXIS II.  No diagnosis. AXIS III. Status post  LASIK surgery. AXIS IV.  Moderate. AXIS V.   Upon discharge 60.  DISCHARGE MEDICATIONS: 1. Wellbutrin slow release 150 twice a day. 2. Risperdal 0.25, one-half to one twice a day. 3. Neurontin 300 mg 5 times a day. 4. Klonopin 0.5 twice a day.  FOLLOWUP:  She is to see Vonna Kotyk for further followup as well as Dr. Geoffery Lyons for medication management. DD:  03/16/01 TD:  03/18/01 Job: [id number]

## 2011-01-03 NOTE — Discharge Summary (Signed)
[name], HERSH NO.:  [id number]   MEDICAL RECORD NO.:  [id number]          PATIENT TYPE:  IPS   LOCATION:  0302                          FACILITY:  [facility]   PHYSICIAN:  Jasmine Pang, M.D. DATE OF BIRTH:  [birth year]   DATE OF ADMISSION:  12/04/2005  DATE OF DISCHARGE:  12/10/2005                                 DISCHARGE SUMMARY   IDENTIFICATION:  A 50 year old Caucasian single divorced female admitted on  a voluntary basis.   HISTORY OF PRESENT ILLNESS:  The patient returned to the hospital after  being discharged 2 weeks ago.  She had been on Wellbutrin and when this was  increased she became quite anxious.  She began to have impulsive thoughts  with poor concentration and thoughts that she may take all her medications.  She describes having bipolar disorder with an acute depressive phase.  She  states she is also experiencing acute anxiety.  She just started a new job,  which is stressful.  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  She also  states she was becoming paranoid.   PAST PSYCHIATRIC HISTORY:  Previous admit to [facility] 2 weeks ago.  Outpatient physician is Dr. Andee Poles.[name] Kitchen   PAST MEDICAL HISTORY:  The patient denies any medical problems.   MEDICATIONS:  Lithium 600 mg a.m. and 6 p.m., Klonopin 0.5 mg p.o. b.i.d.  and 1 p.o. q.h.s., Wellbutrin 300 mg x2 days and recently decreased to 150  mg XL.   DRUG ALLERGIES:  No known drug allergies.   PHYSICAL EXAMINATION:  This was done by nurse practitioner Lynann Bologna, NP.  There were no acute physical problems noted.   ADMISSION LABORATORIES:  Routine chemistry panel grossly within normal  limits except for glucose of 104 (70-99).  TSH was elevated upon admission  at 15.5 (0.35-5.5).  On December 06, 2005 it came back normal at 3.874.  Free  T4 was within normal limits.  Free T3 was within normal limits.   HOSPITAL COURSE:  Upon admission, the Wellbutrin  was discontinued and the  patient was started on Geodon 60 mg p.o. b.i.d. with food and Ambien 10 mg  p.o. q.h.s. p.r.n. insomnia.  She was also continued on lithium ER 300 mg  p.o. b.i.d. and lithium ER 600 mg p.o. q.h.s., Klonopin 0.5 mg p.o. b.i.d.  and 1 mg q.h.s., estrogen 0.5 mg p.o. daily.  On December 04, 2005 she was also  given Cogentin 1 time and Benadryl 50 mg x1 due to some EPS reactions on the  Geodon.  The Geodon was discontinued.  The above p.r.n. Klonopin dose was  discontinued and she was placed on Klonopin 0.5 b.i.d. and 1 mg q.h.s. as a  standing dose.  On December 05, 2005, the patient's lithium level was found to  be elevated at 1.88 (0.8 to 1.4).  Her lithium was discontinued and changed  to lithium 300 mg SR Lithobid p.o. t.i.d. with a repeat lithium level  ordered.  A repeat level was 0.74 (0.8 to 1.4).  For some reason the lithium  was decreased further to 300 mg SR Lithobid b.i.d.  This was subsequently  increased the following day to 300 mg p.o. t.i.d.  On December 06, 2005 she was  given ibuprofen 400 mg p.o. q.6h p.r.n. pain and Vistaril 50 mg p.o. b.i.d.  p.r.n. anxiety.  On December 06, 2005 due to GI upset she was given Phenergan  25 mg IM or p.o. q.6h p.r.n. for nausea.  On December 06, 2005 she was started  on Risperdal 0.5 mg M-Tab p.o. t.i.d. p.r.n. for paranoia and agitation.  On  December 07, 2005 the Klonopin 0.5 mg p.o. b.i.d. was discontinued and instead  she was changed to Klonopin 0.5 mg p.o. q.6 a.m. daily and Klonopin 0.5 mg  p.o. daily p.r.n. anxiety.  On December 08, 2005 the lithium dose was  discontinued as ordered and she was put on lithium 300 mg p.o. b.i.d. and  300 mg h.s. (total 900 mg daily).  A lithium level was ordered.  On December 08, 2005 a family session was ordered with her mother and sister.  On December 08, 2005 she was given Maalox 225 grams x1.   On December 06, 2005 the patient reported feeling dizzy and unsteady.  She  complained of dry mouth.  She  was given Vistaril p.r.n. for anxiety.  She  had taken 1 dose of Risperdal but did not want to take any more of this  because of the side effects (dizziness, unsteady and dry mouth).  She  complained of GI upset as well which was probably related to her increased  lithium level as reported above.  On December 07, 2005 she reported that her  mood was disorganized.  She denied depression.  She states that she was  having trouble sleeping, awoke and changed clothes and has no memory of  that.  She wanted her Klonopin earlier in the a.m. so it was effective when  she goes to the cafeteria for breakfast so this was changed as above.  On  December 07, 2005 the lithium was resumed as indicated above and clonazepam was  rescheduled.  On April 23, she was noted to become more involved on  activities on the unit.  She endorsed feeling less depressed.  She had a  visit from her mother and sister.  Her sister was going to be made her  healthcare power of attorney.  She states she does not feel well enough to  go back to work and will look at [employer] and short-term disability.  On December 09, 2005 mood was depressed, affect constricted but she was cooperative with  exam.  She tended to be somewhat circumstantial as we spoke.  She complained  of the Risperdal p.r.n. causing her mouth to be dry and she was no longer  taking this.  She did not want to be placed on Risperdal.   Mental status exam at time of discharge had improved though the patient was  still depressed and anxious to some extent.  She denied suicidal or  homicidal ideation.  There was no auditory or visual hallucinations, no  paranoia or delusions.  Thoughts were still somewhat disorganized.  Thought  content revolved around wanting to get her disability papers filled out.  Cognitive exam there was decreased attention and concentration and she reported her short-term memory as being somewhat impaired.  She stated that  her family session with her  mother and sister did not go well.  She regrets  complaining about the  conflict with her partner to them.  She feels this has  colored their perception of her partner in a negative way and they do not  approve of her.  It was decided she would transition to IOP for further  treatment and stabilization.  At this point she will be placed on FMLA from  work as she is not ready yet to return.   DISCHARGE DIAGNOSES:  AXIS I:  Bipolar disorder, depressed mood, moderate  without psychosis.  AXIS II:  None.  AXIS III:  None.  AXIS IV:  Severe, stress with family, stress in employment, stress with her  partner.  AXIS V:  Global assessment of functioning upon admission was 35, global  assessment of functioning highest past year was 70, global assessment of  functioning on discharge was 45.   DISCHARGE PLANS:  The patient had no specific dietary or activity level  restrictions.   DISCHARGE MEDICATIONS:  Clonazepam 0.5 mg q.a.m. and 1 mg p.o. q.h.s.,  Estradiol (Estrace) 1 mg 1/2 pill daily, lithium carbonate 300 mg p.o.  t.i.d., Ambien 10 mg p.o. q.h.s. p.r.n. insomnia.   POST HOSPITAL CARE PLAN:  The patient will transfer to the IOP program for  further stabilization.  She is not ready to return to work at this point.  Her followup therapy after IOP will be with Vonna Kotyk and followup  psychiatric treatment with Dr. Andee Poles on May 9 at 9 a.m.      Jasmine Pang, M.D.  Electronically Signed     BHS/MEDQ  D:  12/10/2005  T:  12/10/2005  Job:  [id number]

## 2011-01-03 NOTE — Consult Note (Signed)
[place]  Patient:    [name]                      MRN: [id number] Proc. Date: 09/13/99 Adm. Date:  [id number] Attending:  Nathen May CC:         Nathen May, M.D. [facility]             Heather Roberts, M.D.                          Consultation Report  REASON FOR CONSULTATION:  Chest pain, hypoxemia, subjective.  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  She was treated by Dr. Heather Roberts as her primary physician and received amoxicillin and then Augmentin.  With each course of antibiotics, she would feel better for a while.  The current illness has felt somewhat different with onset while at work three nights ago after a busy but not unusual day.  She had not recognized obvious sore throat, fever, or any impression that she was catching a cold.  She describes an oppressive substernal discomfort which has been mostly painless, with some pounding of the heart.  She got scared and put a pulse oximeter on herself noting heart rate of 138.  She went to the emergency room where she was  treated as a rule out MI, given Lopressor, and then given morphine which she does tolerate well traditionally and which caused nausea with vomiting x 1.  She was admitted.  Subsequently, she developed nasal congestion before starting  nasal oxygen.  Subsequently, laboratory evaluation included a spiral CT scan read as normal, negative for pulmonary embolism, or deep vein thrombosis, extended down to the upper legs.  Chest x-ray was normal by my reading with normal heart size, no effusion, no infiltrates.  Cardiac enzymes are pending but verbally reported unremarkable. Echocardiogram normal without evidence of pericardial effusion, normal chamber pressures.  White blood count  4100, hemoglobin 12.6, ABG on room air shows a pH of 7.44, pCO2 37, pO2 106, bicarbonate 26.  SOCIAL HISTORY:  Unmarried.  Works as Designer, jewellery.  No unusual hobbies or exposures.  Denies HIV risk.  No home medications.  No alternative or herbal therapies.  ALLERGIES:  No medication allergy.  FAMILY HISTORY:  Noncontributory.  Already listed in hospital chart and reviewed.  PAST MEDICAL HISTORY:  Diagnosed in the past with bipolar syndrome.  She denies  feeling that current symptoms reflect her affective disorder.  PHYSICAL EXAMINATION:  VITAL SIGNS:  Temperature 97.8, room air oxygen saturation 100%, blood pressure  115/85, pulse 85 recorded earlier.  Regular at 110 by my count now.  GENERAL:  Alert.  May be slightly apprehensive.  Well-developed, well-nourished  young woman in no significant distress.  SKIN:  No visible rash.  LYMPH NODES:  No adenopathy found at the neck, supraclavicular area, or axillae.  HEENT:  Oral mucosa clear.  Conjunctivae clear.  Gross vision and hearing intact.  NECK:  Supple.  No bruits, neck vein distention, stridor, or thyromegaly.  CHEST:  A definite but mild congestion cough on deep breathing.  No rub, rales,  wheeze, or rhonchi.  Unlabored.  HEART:  Regular rhythm.  No murmurs, rubs, or gallops.  ABDOMEN:  No hepatosplenomegaly or tenderness.  EXTREMITIES:  No clubbing, cyanosis, or edema.  IMPRESSION:  Viral syndrome with tracheal bronchitis.  This may well be influenza since she did not get flu vaccine.  Less likely to be a chlamydia or micoplasma  infection.  I doubt a cardiac syndrome but perhaps will watch for development of cardiomyopathy later.  Doubt esophagitis beyond a simple viral pattern.  PLAN:  Nasal ASA for flu virus.  Tamiflu 75 mg b.i.d. for five days.  Comfort care including simple Tylenol, heating pad, Mylanta if needed.  Zithromax is okay to  continue at this point.  The [place] pulmonary critical  care physicians are covering for me this weekend and we will see her once tomorrow and then p.r.n.  DD:  09/13/99 TD:  09/15/99 Job: [id number]

## 2011-01-03 NOTE — Op Note (Signed)
NAME:  [name], [name]                        ACCOUNT NO.:  [id number]   MEDICAL RECORD NO.:  [id number]                   PATIENT TYPE:  OBV   LOCATION:  9306                                 FACILITY:  [facility]   PHYSICIAN:  Daniel L. Eda Paschal, M.D.           DATE OF BIRTH:  August 23, 1960   DATE OF PROCEDURE:  03/10/2002  DATE OF DISCHARGE:  03/12/2002                                 OPERATIVE REPORT   PREOPERATIVE DIAGNOSES:  1. Pelvic pain.  2. Persistent left ovarian cyst.   POSTOPERATIVE DIAGNOSES:  1. Pelvic pain.  2. Persistent left ovarian cyst.  3. Pelvic adhesive disease.   OPERATIONS:  1. Diagnostic laparoscopy.  2. Left salpingo-oophorectomy.  3. Lysis of pelvic adhesions.   SURGEON:  Daniel L. Eda Paschal, M.D.   FIRST ASSISTANT:  Juan H. Lily Peer, M.D.   FINDINGS:  At the time of surgery, the patient's left ovary was enlarged to  5.5-6 cm by an adnexal mass.  It was densely adherent to sigmoid colon, as  well as to the bladder flap and to the broad ligament.  The fallopian tube  appeared to be normal.   DESCRIPTION OF PROCEDURE:  After adequate general endotracheal anesthesia,  the patient was placed in the dorsolithotomy position and prepped and draped  in the usual sterile manner.  A Foley catheter was inserted into the  patient's bladder.  A pneumoperitoneum was created subumbilically with a  Veress needle and 3.5 L of carbon dioxide were utilized.  A 10 mm trocar was  the placed subumbilically and through that the operating laparoscope was  placed in the pelvis.  A 5 mm port was placed suprapubically in the left  lower quadrant and a variety of instrumentation was placed through that.  First peritoneal washings were obtained and then dissection was started on  the above.  First loops of sigmoid adherent to the bladder flap and the  ovary were taken down using a scissor.  The scissor was kept far away from  the bowel.  Once in a while a little bit of unipolar  coagulation current was  utilized, but it was done carefully to stay away from the bowel wall and  most of the sigmoid colon could be dissected free.  At this point, the  adnexa could be lifted.  The ureter could be identified.  The infundibular  pelvic ligament was bipolared and cut.  The attachments of the adnexa to the  broad ligament laterally could be bipolared and cut without hurting the  ureter which could be identified.  Finally the whole structure could be  elevated.  It was still somewhat adherent to the sigmoid colon.  This was  freed up again with a sharp scissor using very sparingly unipolar  coagulation to avoid injury to the bowel and finally the whole adnexa was  free.  It was placed in an Endopouch and removed intact.  The  pneumoperitoneum was evacuated  once it had been ascertained that there was  bleeding.  The fascia was closed with figure-of-eight of 0 Vicryl. The skin  was closed with Steri-Strips.  The estimated blood loss for the entire  procedure was less than 100 cc with none replaced.  The patient tolerated  the procedure well and left the operating room in satisfactory condition,  draining clear urine from her Foley catheter.  To ensure that the bladder  had not been injured during the dissection, it was distended with 300 cc of  sterile saline at the end of the procedure and there was no leakage of  fluid.  The sigmoid colon was also carefully inspected at the end of the  procedure and there was no evidence of any loss of integrity of the sigmoid  colon.                                                Daniel L. Eda Paschal, M.D.    [name]  D:  03/11/2002  T:  03/17/2002  Job:  [id number]

## 2011-01-03 NOTE — Discharge Summary (Signed)
[place]  Patient:    [name]                      MRN: [id number] Adm. Date:  [id number] Disc. Date: [id number] Attending:  Nathen May Dictator:   Delton See, P.A. CC:         Heather Roberts, M.D.             Clinton D. Maple Hudson, M.D.             Nathen May, M.D. [facility]             Dr. Madaline Guthrie                  Referring Physician Discharge [place]  HISTORY ON ADMISSION:  This is 50 year old female with a history of bipolar disorder, who was [facility] 1999 by Dr. Sherryl Manges for evaluation of chest pain associated with dyspnea and increased heart ate of 140 beats per minute.  She was admitted to rule out coronary etiology of her  chest pain.  PAST MEDICAL HISTORY:  As noted, the patient has a history of a bipolar disorder. She is status post hysterectomy.  ALLERGIES:  No known drug allergies.  SOCIAL HISTORY:  Patient is single.  She is a Designer, jewellery.  MEDICATIONS:  Include Lamictal 25 mg five times daily and Clonopin 0.25-0.5 mg t.i.d. p.r.n.  HOSPITAL COURSE:  As noted, this patient was admitted for evaluation of chest pain. Cardiac enzymes were found to be negative.  A CT scan of her chest was performed to rule out pulmonary embolism, and this was negative for pulmonary embolus.  A  chest x-ray showed no active disease.  Venous Dopplers were performed but showed no evidence of DVT.  A pulmonary consult was obtained for evaluation of dyspnea.  It was felt that the patient had a viral syndrome.  She was placed on ______ and apparently had been  started on a Z pack, and this was continued as well.  The patient had continued episodes of sinus tachycardia.  She had some mild inferior T wave abnormalities on EKG.  She was convinced that her symptoms were  cardiac in origin, and the patient was scheduled for an exercise Cardiolite.  An exercise Cardiolite  was performed on September 16, 1999.  This was negative for ischemia, and revealed an EF of 65%.  A GI evaluation was ordered, including an upper GI and a gallbladder ultrasound. Both of these were negative.  However, on the gallbladder ultrasound the lower ole of the left kidney was not well seen.  Dr. Graciela Husbands discussed the patients case with her primary physician, Dr. Orson Aloe. A decision was made to proceed with discharge following the negative workup, as  described above.  The patient will have early follow-up with Dr. Orson Aloe, as ell as with her psychiatrist.  LABORATORY DATA:  Please see studies as noted above.  A follow-up PA and lateral chest x-ray on January 26 showed no active disease.  On January 28, an CBC revealed hemoglobin 12.5, hematocrit 34.6, WBCs 4000. Platelets 229,000.  A TSH on January 28 was 2.778.  ABGs performed on January 25 revealed pH 7.446, PCO2 37.6, PO2 80.  Repeat ABGs on January 26 for dyspnea revealed pH 7.48, PCO2 28.7, PO2 120.  These were both performed on room air.  A CBC on January 25 revealed hemoglobin 12.6, hematocrit 35.4, WBCs  4100. Platelets 296,000.  Chemistries were unremarkable except for a mildly elevated glucose of 144 at time of admission.  Cardiac enzymes were negative x 3.  A TSH  performed on January 25 was 0.404.  DISCHARGE MEDICATIONS:  1. Clonopin 0.25 mg to 0.5 mg as needed two to three times per day.  2. Lamictal 25 mg five times a day.  3. ______  - the patient had one more dose to complete a five day course.  ACTIVITY:  As tolerated.  DIET:  Regular.  FOLLOW-UP:  With Dr. Orson Aloe and Dr. Madaline Guthrie as scheduled.  PROBLEM LIST AT TIME OF DISCHARGE:  1. Chest pain, myocardial infarction ruled out.  2. Negative CT scan of the chest for pulmonary embolus.  3. Negative lower extremity Dopplers.  4. History of bipolar disorder.  5. Normal 2-D echocardiogram, performed September 13, 1999.  6. Negative exercise  Cardiolite, September 16, 1999, revealing no ischemia,     ejection fraction of 65%.  7. Pulmonary consult, question viral syndrome.  8. Sinus tachycardia, question related to anxiety.  9. Gallbladder ultrasound and upper GI essentially negative, as described above. 10. Low normal white blood cell count. DD:  09/17/99 TD:  09/17/99 Job: [id number]

## 2011-01-10 ENCOUNTER — Telehealth: Payer: Self-pay | Admitting: *Deleted

## 2011-01-10 ENCOUNTER — Ambulatory Visit (INDEPENDENT_AMBULATORY_CARE_PROVIDER_SITE_OTHER): Payer: BC Managed Care – PPO | Admitting: Internal Medicine

## 2011-01-10 ENCOUNTER — Encounter: Payer: Self-pay | Admitting: Internal Medicine

## 2011-01-10 DIAGNOSIS — R7309 Other abnormal glucose: Secondary | ICD-10-CM

## 2011-01-10 DIAGNOSIS — R0609 Other forms of dyspnea: Secondary | ICD-10-CM

## 2011-01-10 DIAGNOSIS — R0600 Dyspnea, unspecified: Secondary | ICD-10-CM | POA: Insufficient documentation

## 2011-01-10 DIAGNOSIS — R739 Hyperglycemia, unspecified: Secondary | ICD-10-CM

## 2011-01-10 DIAGNOSIS — R109 Unspecified abdominal pain: Secondary | ICD-10-CM | POA: Insufficient documentation

## 2011-01-10 DIAGNOSIS — R29898 Other symptoms and signs involving the musculoskeletal system: Secondary | ICD-10-CM

## 2011-01-10 DIAGNOSIS — D649 Anemia, unspecified: Secondary | ICD-10-CM

## 2011-01-10 DIAGNOSIS — M6281 Muscle weakness (generalized): Secondary | ICD-10-CM

## 2011-01-10 HISTORY — DX: Dyspnea, unspecified: R06.00

## 2011-01-10 HISTORY — DX: Hyperglycemia, unspecified: R73.9

## 2011-01-10 HISTORY — DX: Other forms of dyspnea: R06.09

## 2011-01-10 LAB — POCT URINALYSIS DIPSTICK
Blood, UA: NEGATIVE
Ketones, UA: NEGATIVE
Protein, UA: NEGATIVE
Spec Grav, UA: 1.01
pH, UA: 6.5

## 2011-01-10 LAB — CBC WITH DIFFERENTIAL/PLATELET
Basophils Relative: 0.5 % (ref 0.0–3.0)
Eosinophils Relative: 3.8 % (ref 0.0–5.0)
HCT: 40.9 % (ref 36.0–46.0)
Lymphs Abs: 1.7 10*3/uL (ref 0.7–4.0)
MCHC: 33.7 g/dL (ref 30.0–36.0)
MCV: 92.9 fl (ref 78.0–100.0)
Monocytes Absolute: 0.7 10*3/uL (ref 0.1–1.0)
Neutro Abs: 3.3 10*3/uL (ref 1.4–7.7)
RBC: 4.4 Mil/uL (ref 3.87–5.11)
WBC: 5.9 10*3/uL (ref 4.5–10.5)

## 2011-01-10 NOTE — Progress Notes (Signed)
  Subjective:    Patient ID: [name], female    DOB: 01/22/1961, 50 y.o.   MRN: [id number]  HPI Pt presents to clinic for evaluation of weakness and fatigue. Continues to have chronic symptoms of upper and lower extremity weakness status post head CT, thoracic, lumbar sacral and cervical MRI. Has been previously evaluated by neurology. Missed the neurology appointment at [facility]. Has had extensive laboratory evaluation. Did review last hemoglobin with minimal anemia. Denies abdominal pain hematemesis hematochezia or melena. Does note unintentional weight loss. Lites reviewed and stable over the past 2-3 visits however has had approximate 12 pound weight loss over one year. Notes intermittent atypical chest discomfort which is nonexertional nonradiating and may last several hours. Does have intermittent dyspnea on exertion. Previous complaint of palpitations was evaluated by Holter monitor without evidence of arrhythmia. Meds past history of hyperglycemia with no personal history of diabetes mellitus. Chart review indicates also abdominal pelvic CT obtained2011 without significant finding. Does continue to see psychiatry though she states is in the process of changing psychiatrists. Has undergone hysterectomy and bilateral nephrectomy for noncancerous medication. States she missed work [address] and may require an FMLA paperwork to be completed.  Reviewed past medical history, medications and allergies.  Review of Systems see history of present illness    Objective:   Physical Exam    Physical Exam  [nursing notereviewed. Constitutional:  appears well-developed and well-nourished. No distress.  HENT:  Head: Normocephalic and atraumatic.  Right Ear: Tympanic membrane, external ear and ear canal normal.  Left Ear: Tympanic membrane, external ear and ear canal normal.  Nose: Nose normal.  Mouth/Throat: Oropharynx is clear and moist. No oropharyngeal exudate.  Eyes: Conjunctivae are  normal. No scleral icterus.  Neck: Neck supple.  Cardiovascular: Normal rate, regular rhythm and normal heart sounds.  Exam reveals no gallop and no friction rub.   No murmur heard. Pulmonary/Chest: Effort normal and breath sounds normal. No respiratory distress.  no wheezes.  no rales.  Lymphadenopathy:     no cervical adenopathy Abdomen: Soft, nondistended mild tenderness to palpation periumbilical area without rebound guarding rigidity. Positive bowel sounds. No masses organomegaly.  Neurological:  alert.  Skin: Skin is warm and dry.  not diaphoretic.      Assessment & Plan:

## 2011-01-10 NOTE — Assessment & Plan Note (Signed)
With associated atypical chest discomfort. Schedule 2-D echocardiogram

## 2011-01-10 NOTE — Telephone Encounter (Signed)
Error/njr °

## 2011-01-10 NOTE — Assessment & Plan Note (Signed)
Patient requests referral to Dr. Tinnie Gens of neurology.

## 2011-01-10 NOTE — Assessment & Plan Note (Signed)
Repeat CBC. Obtain urinalysis.

## 2011-01-10 NOTE — Assessment & Plan Note (Signed)
Obtain A1c.  

## 2011-01-10 NOTE — Telephone Encounter (Signed)
This afternoon the patient felt dizzy, with nausea, over all body weakness.  Any suggestions?

## 2011-01-10 NOTE — Telephone Encounter (Signed)
Not any more advice than the aggressive extensive workup we're still working on

## 2011-01-10 NOTE — Telephone Encounter (Signed)
Spoke with pt - informed of Dr. Ilda Foil instructions , also encouraged any changes , rapid heart rate , increase in nuasea, any fever , vomiting to ER. verbilized understanding. kIK

## 2011-01-15 ENCOUNTER — Telehealth: Payer: Self-pay

## 2011-01-15 LAB — PROTEIN ELECTROPHORESIS, SERUM
Beta 2: 5.2 % (ref 3.2–6.5)
Beta Globulin: 7.4 % — ABNORMAL HIGH (ref 4.7–7.2)
Gamma Globulin: 13.3 % (ref 11.1–18.8)

## 2011-01-15 NOTE — Telephone Encounter (Signed)
Message copied by Beverely Low on Wed Jan 15, 2011  2:40 PM ------      Message from: Staci Righter      Created: Tue Jan 14, 2011 10:17 PM       Labs nl

## 2011-01-15 NOTE — Telephone Encounter (Signed)
Pt notified labs nl

## 2011-01-16 ENCOUNTER — Other Ambulatory Visit: Payer: Self-pay | Admitting: Internal Medicine

## 2011-01-17 ENCOUNTER — Telehealth: Payer: Self-pay

## 2011-01-17 ENCOUNTER — Other Ambulatory Visit: Payer: Self-pay | Admitting: Internal Medicine

## 2011-01-17 DIAGNOSIS — R0600 Dyspnea, unspecified: Secondary | ICD-10-CM

## 2011-01-17 NOTE — Telephone Encounter (Signed)
Pt.notified

## 2011-01-21 ENCOUNTER — Telehealth: Payer: Self-pay

## 2011-01-21 NOTE — Telephone Encounter (Signed)
FMLA forms completed and mailed, per pt request.   Pt notes that she is going to reschedule the echo appt scheduled for 01/27/2011

## 2011-01-23 ENCOUNTER — Emergency Department (HOSPITAL_COMMUNITY): Payer: BC Managed Care – PPO

## 2011-01-23 ENCOUNTER — Telehealth: Payer: Self-pay | Admitting: *Deleted

## 2011-01-23 ENCOUNTER — Observation Stay (HOSPITAL_COMMUNITY)
Admission: EM | Admit: 2011-01-23 | Discharge: 2011-01-25 | Disposition: A | Discharge status: Home / Self Care | Payer: BC Managed Care – PPO | Source: Ambulatory Visit | Attending: Internal Medicine | Admitting: Internal Medicine

## 2011-01-23 ENCOUNTER — Telehealth: Payer: Self-pay

## 2011-01-23 DIAGNOSIS — J309 Allergic rhinitis, unspecified: Secondary | ICD-10-CM | POA: Insufficient documentation

## 2011-01-23 DIAGNOSIS — R5383 Other fatigue: Secondary | ICD-10-CM | POA: Insufficient documentation

## 2011-01-23 DIAGNOSIS — Z8249 Family history of ischemic heart disease and other diseases of the circulatory system: Secondary | ICD-10-CM | POA: Insufficient documentation

## 2011-01-23 DIAGNOSIS — R5381 Other malaise: Secondary | ICD-10-CM | POA: Insufficient documentation

## 2011-01-23 DIAGNOSIS — R079 Chest pain, unspecified: Principal | ICD-10-CM | POA: Insufficient documentation

## 2011-01-23 DIAGNOSIS — Z87891 Personal history of nicotine dependence: Secondary | ICD-10-CM | POA: Insufficient documentation

## 2011-01-23 DIAGNOSIS — F319 Bipolar disorder, unspecified: Secondary | ICD-10-CM | POA: Insufficient documentation

## 2011-01-23 DIAGNOSIS — R0789 Other chest pain: Secondary | ICD-10-CM | POA: Insufficient documentation

## 2011-01-23 HISTORY — PX: CARDIOVASCULAR STRESS TEST: SHX262

## 2011-01-23 LAB — URINALYSIS, ROUTINE W REFLEX MICROSCOPIC
Bilirubin Urine: NEGATIVE
Hgb urine dipstick: NEGATIVE
Protein, ur: NEGATIVE mg/dL
Urobilinogen, UA: 0.2 mg/dL (ref 0.0–1.0)

## 2011-01-23 LAB — RAPID URINE DRUG SCREEN, HOSP PERFORMED
Amphetamines: NOT DETECTED
Benzodiazepines: NOT DETECTED

## 2011-01-23 LAB — CBC
Hemoglobin: 14.6 g/dL (ref 12.0–15.0)
MCH: 30.9 pg (ref 26.0–34.0)
MCV: 90 fL (ref 78.0–100.0)
Platelets: 307 10*3/uL (ref 150–400)
RBC: 4.72 MIL/uL (ref 3.87–5.11)

## 2011-01-23 LAB — COMPREHENSIVE METABOLIC PANEL
AST: 19 U/L (ref 0–37)
Albumin: 4.4 g/dL (ref 3.5–5.2)
Alkaline Phosphatase: 64 U/L (ref 39–117)
Chloride: 104 mEq/L (ref 96–112)
GFR calc Af Amer: >60 mL/min (ref >60)
Potassium: 4.2 mEq/L (ref 3.5–5.1)
Total Bilirubin: 0.6 mg/dL (ref 0.3–1.2)

## 2011-01-23 LAB — DIFFERENTIAL
Eosinophils Absolute: 0.2 10*3/uL (ref 0.0–0.7)
Lymphs Abs: 1.8 10*3/uL (ref 0.7–4.0)
Monocytes Absolute: 0.6 10*3/uL (ref 0.1–1.0)
Monocytes Relative: 8 % (ref 3–12)
Neutrophils Relative %: 62 % (ref 43–77)

## 2011-01-23 LAB — TROPONIN I: Troponin I: <0.3 ng/mL (ref <0.30)

## 2011-01-23 NOTE — Telephone Encounter (Signed)
patient  Is calling because of chest pain, weakness, headache, nausea, and right sided tingling.    Patient was advised to go to the ER.

## 2011-01-23 NOTE — Telephone Encounter (Signed)
Pt rescheduled ECHO appt for next week.   While lying in bed last night pt experienced weakness, HA, tingling down rt arm, and chest pain. Not like "elephant sitting on chest. It's just a deep pain." Pls advise

## 2011-01-23 NOTE — Telephone Encounter (Signed)
Agree as per previous note and phone call

## 2011-01-23 NOTE — Telephone Encounter (Signed)
ED if active CP as you instructed already

## 2011-01-24 DIAGNOSIS — R079 Chest pain, unspecified: Secondary | ICD-10-CM

## 2011-01-24 DIAGNOSIS — R5383 Other fatigue: Secondary | ICD-10-CM

## 2011-01-24 DIAGNOSIS — R5381 Other malaise: Secondary | ICD-10-CM

## 2011-01-24 LAB — DIFFERENTIAL
Basophils Relative: 1 % (ref 0–1)
Eosinophils Absolute: 0.3 10*3/uL (ref 0.0–0.7)
Lymphs Abs: 2 10*3/uL (ref 0.7–4.0)
Monocytes Relative: 10 % (ref 3–12)
Neutro Abs: 2.5 10*3/uL (ref 1.7–7.7)
Neutrophils Relative %: 47 % (ref 43–77)

## 2011-01-24 LAB — MRSA PCR SCREENING: MRSA by PCR: NEGATIVE

## 2011-01-24 LAB — CBC
Hemoglobin: 11.7 g/dL — ABNORMAL LOW (ref 12.0–15.0)
MCV: 92.1 fL (ref 78.0–100.0)
Platelets: 248 10*3/uL (ref 150–400)
RBC: 3.92 MIL/uL (ref 3.87–5.11)
WBC: 5.4 10*3/uL (ref 4.0–10.5)

## 2011-01-24 LAB — URINE CULTURE
Colony Count: 35000
Culture  Setup Time: 201206072257

## 2011-01-24 LAB — PHOSPHORUS: Phosphorus: 3.3 mg/dL (ref 2.3–4.6)

## 2011-01-24 LAB — BASIC METABOLIC PANEL
BUN: 10 mg/dL (ref 6–23)
Creatinine, Ser: 0.62 mg/dL (ref 0.4–1.2)
GFR calc Af Amer: >60 mL/min (ref >60)
GFR calc non Af Amer: >60 mL/min (ref >60)

## 2011-01-24 LAB — CK TOTAL AND CKMB (NOT AT ARMC)
CK, MB: 3 ng/mL (ref 0.3–4.0)
Relative Index: INVALID (ref 0.0–2.5)
Total CK: 84 U/L (ref 7–177)

## 2011-01-24 LAB — TROPONIN I: Troponin I: <0.3 ng/mL (ref <0.30)

## 2011-01-24 LAB — MAGNESIUM: Magnesium: 2 mg/dL (ref 1.5–2.5)

## 2011-01-25 ENCOUNTER — Inpatient Hospital Stay (HOSPITAL_COMMUNITY): Payer: BC Managed Care – PPO

## 2011-01-25 LAB — GLUCOSE, CAPILLARY: Glucose-Capillary: 102 mg/dL — ABNORMAL HIGH (ref 70–99)

## 2011-01-25 MED ORDER — TECHNETIUM TC 99M TETROFOSMIN IV KIT
30.0000 | PACK | Freq: Once | INTRAVENOUS | Status: AC | PRN
  Administered 2011-01-25: 30 via INTRAVENOUS

## 2011-01-25 MED ORDER — TECHNETIUM TC 99M TETROFOSMIN IV KIT
10.0000 | PACK | Freq: Once | INTRAVENOUS | Status: AC | PRN
  Administered 2011-01-25: 10 via INTRAVENOUS

## 2011-01-26 ENCOUNTER — Other Ambulatory Visit (HOSPITAL_COMMUNITY): Payer: BC Managed Care – PPO

## 2011-01-27 ENCOUNTER — Other Ambulatory Visit (HOSPITAL_COMMUNITY): Payer: BC Managed Care – PPO

## 2011-01-27 ENCOUNTER — Telehealth: Payer: Self-pay | Admitting: *Deleted

## 2011-01-27 NOTE — Telephone Encounter (Addendum)
Pt has been in the hospital x 3 days for chest pain, and has come home and still has chest pain.   Does not like the Cardiology group.   Really wants to talk to Dr. Rodena Medin  [phone]

## 2011-01-27 NOTE — Telephone Encounter (Signed)
Pt called back, and said she had spoken to Cardiology again, and they will not schedule her as a pt unless Dr. Rodena Medin calls and gives a referral.

## 2011-01-27 NOTE — Telephone Encounter (Signed)
Pt called again saying that they gave her a rx for Ecotrin but she thinks it's the 325mg  and it's suppose to be 81mg . She has been having chest pain yesterday and today. Pt is also having weakness in her legs. They have told her to go to neuro but she wants a 2nd opinion for this. She doesn't think the weakness in her legs is neuro related.

## 2011-01-27 NOTE — Telephone Encounter (Signed)
i noticed that they ordered a chest ct scan but then cancelled it. That would be the next step with the cp as not all cp is heart related.

## 2011-01-28 ENCOUNTER — Other Ambulatory Visit: Payer: Self-pay | Admitting: Internal Medicine

## 2011-01-28 NOTE — Telephone Encounter (Signed)
Referral order placed.

## 2011-01-28 NOTE — Telephone Encounter (Signed)
Left message to notify pt ref order placed

## 2011-01-28 NOTE — Telephone Encounter (Signed)
I think she will be happy with an official referral from Dr. Rodena Medin for Cardiology.

## 2011-01-30 ENCOUNTER — Ambulatory Visit: Payer: BC Managed Care – PPO | Admitting: Internal Medicine

## 2011-01-30 ENCOUNTER — Other Ambulatory Visit (HOSPITAL_COMMUNITY): Payer: BC Managed Care – PPO | Admitting: Radiology

## 2011-01-30 NOTE — Discharge Summary (Signed)
  NAMEJO-ANNE, KLUTH NO.:  [id number]  MEDICAL RECORD NO.:  [id number]  LOCATION:  2926                         FACILITY:  [facility]  PHYSICIAN:  Jeoffrey Massed, MD    DATE OF BIRTH:  04/05/1961  DATE OF ADMISSION:  01/23/2011 DATE OF DISCHARGE:  01/25/2011                        DISCHARGE SUMMARY - REFERRING   PRIMARY CARE PRACTITIONER:  Charlynn Court, MD  PRIMARY DISCHARGE DIAGNOSIS:  Atypical chest pain.  SECONDARY DISCHARGE DIAGNOSES: 1. Bipolar disorder. 2. History of chronic weakness, being followed by Dr. Marjory Lies of     [facility] as an outpatient. 3. Allergic rhinitis. 4. History of endometriosis. 5. History of intermittent headache.  DISCHARGE MEDICATIONS: 1. Aspirin 81 mg 1 tablet p.o. daily. 2. Protonix 40 mg 1 tablet daily. 3. Estradiol 1 mg 1 tablet p.o. daily at bedtime. 4. Klonopin 1 mg 1 tablet p.o. daily at bedtime. 5. Lamictal 200 mg 1 tablet p.o. every morning. 6. Lithium carbonate 600 mg 1 tablet daily at bedtime.  CONSULTATION:  Dr. Gala Romney from [facility].  PERTINENT RADIOLOGICAL STUDIES: 1. X-ray of the chest showed normal and stable chest x-ray. 2. Nuclear stress test showed no evidence of pharmacologically induced     ischemia.  No evidence of reversible defect.  Normal wall motion,     ejection fraction of 81%.  PERTINENT LABORATORY DATA: 1. Cardiac enzymes were cycled and these were negative. 2. D-dimer was 0.22.  BRIEF HOSPITAL COURSE: 1. Chest pain.  The patient came in and complaining of chest pain.     She described the pain in substernal radiating region with pressure     like sensation.  She was evaluated with EKG and cardiac enzymes     which was essentially negative.  As noted above, D-dimer was     negative as well.  Because of her persistent nature of chest pain,     Cardiology was consulted and this subsequently recommended a     Lexiscan.  A Lexiscan has been done today and it is noted as  above.     The patient is being discharged home with recommendations for     continuing a baby aspirin a day along with a proton pump inhibitor     trial for at least 30 days.  Please note that the patient is     currently chest pain free. 2. Bipolar disorder.  This is stable.  She is to continue her usual     medications.  DISPOSITION:  It is felt that the patient has met maximal benefit from inpatient hospital stay, is currently chest pain free, and stress test is negative.  It is felt stable to be discharged home at this point in time.  FOLLOWUP INSTRUCTIONS:  The patient is to follow up with primary care practitioner within 1 week upon discharge.  She is to call and make an appointment.  She claims understanding.     Jeoffrey Massed, MD     SG/MEDQ  D:  01/25/2011  T:  01/25/2011  Job:  [id number]  cc:   Charlynn Court, MD Luis Abed, MD, [facility]  Electronically Signed by Jeoffrey Massed  on 01/30/2011 07:34:06 PM

## 2011-01-31 ENCOUNTER — Ambulatory Visit: Payer: BC Managed Care – PPO | Admitting: Cardiovascular Disease

## 2011-02-04 ENCOUNTER — Encounter: Payer: Self-pay | Admitting: Internal Medicine

## 2011-02-04 ENCOUNTER — Telehealth: Payer: Self-pay | Admitting: *Deleted

## 2011-02-04 NOTE — Telephone Encounter (Signed)
noted 

## 2011-02-04 NOTE — Telephone Encounter (Signed)
Pt calling to advise she has an appt with Cardiology on 7/6 for continual chest pain that she was hospitalized for.  I will keep you all updated with appointment with neurologist.

## 2011-02-07 ENCOUNTER — Telehealth: Payer: Self-pay | Admitting: *Deleted

## 2011-02-07 NOTE — Telephone Encounter (Signed)
Pt would like to speak to Dr. Ty Hilts nurse, and prob make an appt with him, please.

## 2011-02-07 NOTE — Telephone Encounter (Signed)
Pt will wait to see Dr. Rodena Medin after her appointment with Cardiology on 02/21/11. Pt also notes that she received a letter from her insurance company stating that they will not pay the hospital bill from her admittance. She states that [facility] changed the dates of her stay and how long she stayed.

## 2011-02-11 NOTE — Consult Note (Addendum)
[name], [name] NO.:  [id number]  MEDICAL RECORD NO.:  [id number]  LOCATION:  2926                         FACILITY:  [facility]  PHYSICIAN:  Bevelyn Buckles. Stepheni Cameron, MDDATE OF BIRTH:  17-Feb-1961  DATE OF CONSULTATION:  01/24/2011 DATE OF DISCHARGE:                                CONSULTATION   PRIMARY CARDIOLOGIST:  [name] remotely saw Dr. Myrtis Ser, had a cath in 2001.  PRIMARY MEDICAL DOCTOR:  Charlynn Court, MD  CHIEF COMPLAINT:  Weakness and chest pain.  HISTORY OF PRESENT ILLNESS:  [name] is a 50 year old female with a history of normal coronary arteries by cath in 2001 with normal LV function by echo February 2012 as well as bipolar disorder who is admitted with weakness and chest pain.  She has had several months of upper extremity and lower extremity weakness, and has been evaluated by Neurology with a negative workup thus far.  Last night she was lying in bed and felt paralyzed with substernal chest pain, she describes as a pressure-like sensation without associated symptoms including shortness of breath, diaphoresis, nausea, or vomiting.  She had hours and hours of pain, overnight and into [name] following day when she decided to proceed to [name] hospital.  Her EKG was essentially normal.  First set of cardiac enzymes did have an elevated MB at 5.3, but negative CK and troponin. Subsequent 2 sets were negative.  She does report that nitroglycerin possibly relieved to pain.  She is not tender to palpation.  With her at activities of daily living, she has got chest pain or shortness of breath, but has not particularly exerted herself lately secondary to weakness.  PAST MEDICAL HISTORY: 1. Bipolar disorder. 2. Allergic rhinitis. 3. Endometriosis status post hysterectomy and oophorectomy. 4. Hiatal hernia by EGD in 2006. 5. Normal coronary arteries by cath in 2001. 6. Echo October 13, 2010, showing EF 55-60% with no wall motion  abnormalities.  PAST SURGICAL HISTORY:  Appendectomy and cholecystectomy.  SOCIAL HISTORY:  [name] lives in [place].  She denies any tobacco, alcohol, or drug use.  FAMILY HISTORY:  She had an uncle who had MI in his 3s.  Her father died of cancer.  Otherwise, no heart problems in [name] family.  OUTPATIENT MEDICATIONS: 1. Lamisil 200 mg daily. 2. Lithium 600 mg at bedtime. 3. Klonopin 1 mg at bedtime. 4. Estradiol 1 mg at bedtime.  ALLERGIES:  MORPHINE.  REVIEW OF SYSTEMS:  [name] feels chilly occasionally.  She denies any nausea, vomiting, diarrhea, bright red blood per rectum, melena, or hematemesis.  All other systems reviewed and otherwise negative except as noted in HPI.  LABORATORY DATA:  WBC 5.4, hemoglobin 11.7, hematocrit 34, and platelet count 248.  Sodium 137, potassium 3.8, chloride 107, CO2 of 27, glucose 107, BUN 10, and creatinine 0.62.  LFTs within normal limits.  TSH 2.149.  D-dimer negative.  Magnesium 2.0.  Lithium 1.25.  UA is negative.  Urine drug screen positive for opiates.  RADIOLOGIC STUDIES: 1. Chest x-ray, January 23, 2011, showed normal and stable chest x-ray. 2. EKG showed bradycardia with a rate of 58 beats per minute with no     acute ST  or T changes.  PHYSICAL EXAMINATION:  VITAL SIGNS:  Pulse 85, respirations 12, blood pressure 94/57, and pulse oximetry 100% on room air. GENERAL:  This is a pleasant white female in no acute distress. HEENT:  Normocephalic and atraumatic.  Extraocular movements intact. Clear sclerae.  Nares without discharge. NECK:  Supple without carotid bruits. HEART:  Auscultation of [name] heart reveals regular rate and rhythm with S1-S2 without murmurs, rubs, or gallops. LUNGS:  Clear to auscultation bilaterally without wheezes, rales, or rhonchi. ABDOMEN:  Soft, nontender, and nondistended with positive bowel sounds. EXTREMITIES:  Warm, dry and without edema. NEUROLOGIC:  She is alert and oriented x3 and  responds questions appropriately with a normal affect.  ASSESSMENT/PLAN:  [name] was seen and examined by Dr. Gala Romney and myself.  This is a 50 year old female with history of normal coronary arteries by cath in 2001 with normal left ventricular function without wall motion abnormalities, February of this year, bipolar disorder, prior hiatal hernia who presented to [facility] with complaints of weakness and chest pain.  At this time, her chest pain was felt more atypical in nature and we did not feel represent acute coronary syndrome.  Cardiac enzymes have remained negative x3 and EKG is essentially normal.  Given [name] duration of pain without any change in both EKG and enzymes, doubt this is cardiac in nature.  Given [name] severity, however, as well for concern, we will plan for Lexiscan Myoview in [name] morning.  Thank you for [name] opportunity to participate in [name] care of this [name].     Dayna Dunn, P.A.C.   ______________________________ Bevelyn Buckles. Laruth Hanger, MD    DD/MEDQ  D:  01/24/2011  T:  01/25/2011  Job:  [id number]  cc:   Luis Abed, MD, [facility] Charlynn Court, MD  Electronically Signed by Ronie Spies  on 02/11/2011 02:13:06 PM Electronically Signed by Arvilla Meres MD on 02/20/2011 03:24:10 PM

## 2011-02-14 NOTE — H&P (Signed)
[name], [name] NO.:  [id number]  MEDICAL RECORD NO.:  [id number]  LOCATION:  [facility]                         FACILITY:  [facility]  PHYSICIAN:  Rock Nephew, MD       DATE OF BIRTH:  12-15-1960  DATE OF ADMISSION:  01/23/2011 DATE OF DISCHARGE:                             HISTORY & PHYSICAL   PRIMARY CARE PHYSICIAN:  Charlynn Court, MD  CHIEF COMPLAINT:  Chest discomfort.  HISTORY OF PRESENT ILLNESS:  This is a 50 year old female with a past medical history of bipolar disorder, former history of tobacco abuse, and family history of premature coronary artery disease who comes with a chief complaint of substernal chest pain.  The patient reports that the substernal chest pain is 10/10 in intensity.  The patient reports that the chest pain was eased by nitroglycerin.  The patient had some nonspecific shortness of breath.  She denies any left arm and left jaw radiation.  She reports some radiation to the right arm.  She reports some nausea.  She denies any heart palpitations.  She reports that she has had a cardiac catheterization done about 12 years ago by Dr. Riley Kill, which was negative.  The patient's EKG has no specific changes. Troponin I is negative, and we are asked to admit this patient.  PAST MEDICAL HISTORY: 1. Allergic rhinitis. 2. Bipolar disorder. 3. History of endometriosis. 4. History of weakness. 5. History of headache.  PAST SURGICAL HISTORY:  Hysterectomy.  SOCIAL HISTORY:  Nondrinker.  No drugs.  The patient is a former smoker. She lives at home by herself, and she works as a Engineer, civil (consulting) at [employer].  ALLERGIES:  No known drug allergies.  REVIEW OF SYSTEMS:  She denies any headaches, blurry vision, chest pain, shortness of breath per HPI.  She has some nausea, but no vomiting.  No abdominal pain.  No constipation.  No diarrhea.  No burning on urination.  No pain in her legs.  FAMILY HISTORY:  She has an uncle with heart disease at  the age of 2 per patient.  HOME MEDICATIONS: 1. She takes Klonopin 1 mg p.o. nightly. 2. Lamictal 300 mg p.o. daily. 3. She takes lithium 600 mg at bedtime. 4. She also takes an estrogen cream.  PHYSICAL EXAMINATION:  VITAL SIGNS:  Temperature is 97.2, blood pressure is 107/66, pulse rate is 81, respiratory rate is 18, 95% saturation on 2 L of nasal cannula. HEAD, EYES, EARS, NOSE, AND THROAT:  Normocephalic and atraumatic. Pupils are equally round and reactive to light. CARDIOVASCULAR:  S1 and S2.  Regular rate and rhythm.  No murmurs, no rubs. LUNGS:  Clear to auscultation bilaterally.  No wheezes or rhonchi. ABDOMEN:  Soft, nontender, and nondistended.  Bowel sounds are positive. No guarding or rebound tenderness. EXTREMITIES:  No lower extremity edema is evident.  The patient is alert, awake, oriented x3.  A 12-lead EKG shows normal sinus rhythm with no acute ST or T-wave changes.  RADIOLOGICAL STUDIES:  Chest x-ray is normal and stable chest x-ray. The patient's WBC count is 6.8, hemoglobin is 14.6, hematocrit is 42.5, MCV is 90.0, platelets 307, neutrophils 62, sodium is 139, potassium is 4.2, chloride  104, bicarbonate 27, BUN 11, creatinine 0.70, glucose is 94, total bili 0.6, alk phos 64, AST is 19, ALT 15, total protein 7.5, albumin 4.4, calcium 10.7, troponin I is less than 0.30, CK-MB 5.3.  The patient's chest x-ray is normal and stable chest x-ray  IMPRESSION AND PLAN:  This is a 50 year old female admitted with chest pain. 1. Chest pain:  For the chest pain, the patient will be admitted to     telemetry.  The patient will be placed on aspirin 325 mg p.o.     daily.  The patient will have cardiac enzymes x3.  Also, the     patient is taking some estrogen.  So, we will get a STAT D-dimer.     If the D-dimer is elevated, we will get a CT angiogram of the chest     to rule out pulmonary embolism. 2. Bipolar disorder:  The patient will be continued on Lamictal and      lithium.  We will also check a lithium level.  The patient will     also be continued on Klonopin. 3. Weakness:  Etiology of the weakness is not clear.  However, we will     monitor the patient. 4. Deep vein thrombosis prophylaxis:  The patient will get Lovenox for     deep vein thrombosis prophylaxis.  The patient is a full code.     Rock Nephew, MD     NH/MEDQ  D:  01/23/2011  T:  01/23/2011  Job:  [id number]  cc:   Charlynn Court, MD  Electronically Signed by Rock Nephew MD on 02/14/2011 11:11:58 PM

## 2011-02-21 ENCOUNTER — Encounter: Payer: BC Managed Care – PPO | Admitting: Internal Medicine

## 2011-03-10 ENCOUNTER — Telehealth: Payer: Self-pay

## 2011-03-10 ENCOUNTER — Encounter: Payer: Self-pay | Admitting: Family Medicine

## 2011-03-10 ENCOUNTER — Ambulatory Visit (INDEPENDENT_AMBULATORY_CARE_PROVIDER_SITE_OTHER): Payer: BC Managed Care – PPO | Admitting: Family Medicine

## 2011-03-10 VITALS — BP 114/80 | HR 74 | Temp 98.2°F | Wt 130.0 lb

## 2011-03-10 DIAGNOSIS — R42 Dizziness and giddiness: Secondary | ICD-10-CM

## 2011-03-10 LAB — HEPATIC FUNCTION PANEL
ALT: 13 U/L (ref 0–35)
Total Bilirubin: 0.5 mg/dL (ref 0.3–1.2)

## 2011-03-10 LAB — CBC WITH DIFFERENTIAL/PLATELET
Basophils Relative: 0.5 % (ref 0.0–3.0)
Eosinophils Relative: 3.2 % (ref 0.0–5.0)
HCT: 39.2 % (ref 36.0–46.0)
Lymphs Abs: 1.5 10*3/uL (ref 0.7–4.0)
Monocytes Relative: 7.9 % (ref 3.0–12.0)
Platelets: 283 10*3/uL (ref 150.0–400.0)
RBC: 4.27 Mil/uL (ref 3.87–5.11)
WBC: 6.1 10*3/uL (ref 4.5–10.5)

## 2011-03-10 LAB — BASIC METABOLIC PANEL
BUN: 16 mg/dL (ref 6–23)
Calcium: 9.3 mg/dL (ref 8.4–10.5)
Creatinine, Ser: 0.6 mg/dL (ref 0.4–1.2)
GFR: 115.06 mL/min (ref >60.00)

## 2011-03-10 LAB — TSH: TSH: 1.27 u[IU]/mL (ref 0.35–5.50)

## 2011-03-10 NOTE — Progress Notes (Signed)
  Subjective:    Patient ID: [name], female    DOB: 1960/10/10, 50 y.o.   MRN: [id number]  HPI Here for 3 days of sudden onset dizziness. She describes this as the room spinning, as unsteadiness on her feet, and as slightly blurred vision. No HA or SOB or chest pain. Slight nausea without vomiting. She has bee on Lithium and Lamictal for years, but she saw Dr. Donell Beers for the first time last week, and he increased the Lamictal dose from 300 mg a day to 400 mg a day. For the past 2 days she has not taken any Lamictal at all. She feels a little better but is still dizzy.   Review of Systems  Constitutional: Negative.   Eyes: Positive for visual disturbance.  Respiratory: Negative.   Cardiovascular: Negative.   Neurological: Positive for dizziness and light-headedness. Negative for seizures, syncope, speech difficulty, weakness, numbness and headaches.       Objective:   Physical Exam  Constitutional: She is oriented to person, place, and time. She appears well-developed and well-nourished.  Neck: No thyromegaly present.  Cardiovascular: Normal rate, regular rhythm, normal heart sounds and intact distal pulses.   Pulmonary/Chest: Effort normal and breath sounds normal.  Lymphadenopathy:    She has no cervical adenopathy.  Neurological: She is alert and oriented to person, place, and time. No cranial nerve deficit. She exhibits normal muscle tone. Coordination normal.          Assessment & Plan:  This sounds like medication side effects. She will get back on Lamictal but only to take 100 mg a day ( or 1/2 a tablet). Get labs today including a lithium level. She will contact Dr. Donell Beers to ask for advice. Out of work today and tomorrow.

## 2011-03-10 NOTE — Telephone Encounter (Signed)
Pt c/o experiencing double vision while out of town over the weekend. States that she sat down in and someone advised her that maybe her sugar was low so she began to eat packets of sugar but it did not help her feel any better. Sp notes that one of her doctors did increase her lamictal to 400 mg qd and she's not sure if it had something to do with that because that was the first time she had experienced the double vision.  Pt scheduled to see Dr. Clent Ridges

## 2011-03-11 ENCOUNTER — Telehealth: Payer: Self-pay | Admitting: *Deleted

## 2011-03-11 LAB — LITHIUM LEVEL: Lithium Lvl: 0.18 mEq/L — ABNORMAL LOW (ref 0.80–1.40)

## 2011-03-11 NOTE — Telephone Encounter (Signed)
Message copied by Baldemar Friday on Tue Mar 11, 2011  4:55 PM ------      Message from: Gershon Crane A      Created: Tue Mar 11, 2011 11:07 AM       Normal, although the lithium level is a bit low. Nothing to explain her symptoms

## 2011-03-11 NOTE — Telephone Encounter (Signed)
Script called in and pt aware. 

## 2011-03-11 NOTE — Telephone Encounter (Signed)
Left message with results

## 2011-03-11 NOTE — Telephone Encounter (Signed)
Pt is wanting lab results and also to let Dr Clent Ridges know that she is still having symptoms. Nausea, dizziness, hot and cold chills

## 2011-03-11 NOTE — Telephone Encounter (Signed)
Her labs looked okay. I still think the Lamictal is part of the problem. She was to have talked with Dr. Donell Beers about this. It is possible that this is a viral infection in her inner ear. Call in Meclizine 25 mg to take every 4 hours prn dizziness, #60 with 2 rf

## 2011-03-11 NOTE — Telephone Encounter (Signed)
I left voice message for pt to return my call. 

## 2011-03-12 ENCOUNTER — Encounter: Payer: Self-pay | Admitting: Internal Medicine

## 2011-03-13 ENCOUNTER — Encounter: Payer: Self-pay | Admitting: Internal Medicine

## 2011-03-13 ENCOUNTER — Ambulatory Visit (INDEPENDENT_AMBULATORY_CARE_PROVIDER_SITE_OTHER): Payer: BC Managed Care – PPO | Admitting: Internal Medicine

## 2011-03-13 DIAGNOSIS — H532 Diplopia: Secondary | ICD-10-CM

## 2011-03-14 ENCOUNTER — Emergency Department (HOSPITAL_COMMUNITY): Payer: BC Managed Care – PPO

## 2011-03-14 ENCOUNTER — Telehealth: Payer: Self-pay | Admitting: *Deleted

## 2011-03-14 ENCOUNTER — Emergency Department (HOSPITAL_COMMUNITY)
Admission: EM | Admit: 2011-03-14 | Discharge: 2011-03-15 | Disposition: A | Discharge status: Home / Self Care | Payer: BC Managed Care – PPO | Attending: Emergency Medicine | Admitting: Emergency Medicine

## 2011-03-14 DIAGNOSIS — Z79899 Other long term (current) drug therapy: Secondary | ICD-10-CM | POA: Insufficient documentation

## 2011-03-14 DIAGNOSIS — R51 Headache: Secondary | ICD-10-CM | POA: Insufficient documentation

## 2011-03-14 DIAGNOSIS — R262 Difficulty in walking, not elsewhere classified: Secondary | ICD-10-CM | POA: Insufficient documentation

## 2011-03-14 DIAGNOSIS — R5383 Other fatigue: Secondary | ICD-10-CM | POA: Insufficient documentation

## 2011-03-14 DIAGNOSIS — H538 Other visual disturbances: Secondary | ICD-10-CM | POA: Insufficient documentation

## 2011-03-14 DIAGNOSIS — H532 Diplopia: Secondary | ICD-10-CM | POA: Insufficient documentation

## 2011-03-14 DIAGNOSIS — R5381 Other malaise: Secondary | ICD-10-CM | POA: Insufficient documentation

## 2011-03-14 DIAGNOSIS — F319 Bipolar disorder, unspecified: Secondary | ICD-10-CM | POA: Insufficient documentation

## 2011-03-14 DIAGNOSIS — F29 Unspecified psychosis not due to a substance or known physiological condition: Secondary | ICD-10-CM | POA: Insufficient documentation

## 2011-03-14 DIAGNOSIS — Z7982 Long term (current) use of aspirin: Secondary | ICD-10-CM | POA: Insufficient documentation

## 2011-03-14 DIAGNOSIS — R279 Unspecified lack of coordination: Secondary | ICD-10-CM | POA: Insufficient documentation

## 2011-03-14 LAB — POCT I-STAT, CHEM 8
BUN: 15 mg/dL (ref 6–23)
Chloride: 107 mEq/L (ref 96–112)
Potassium: 4.5 mEq/L (ref 3.5–5.1)
Sodium: 140 mEq/L (ref 135–145)

## 2011-03-14 LAB — RAPID URINE DRUG SCREEN, HOSP PERFORMED
Barbiturates: NOT DETECTED
Benzodiazepines: NOT DETECTED
Cocaine: NOT DETECTED
Opiates: NOT DETECTED

## 2011-03-14 LAB — DIFFERENTIAL
Basophils Relative: 1 % (ref 0–1)
Lymphs Abs: 1.5 10*3/uL (ref 0.7–4.0)
Monocytes Relative: 10 % (ref 3–12)
Neutro Abs: 4.2 10*3/uL (ref 1.7–7.7)
Neutrophils Relative %: 62 % (ref 43–77)

## 2011-03-14 LAB — CBC
Hemoglobin: 14.1 g/dL (ref 12.0–15.0)
MCH: 30.1 pg (ref 26.0–34.0)
MCV: 89.3 fL (ref 78.0–100.0)
RBC: 4.68 MIL/uL (ref 3.87–5.11)

## 2011-03-14 LAB — URINALYSIS, ROUTINE W REFLEX MICROSCOPIC
Glucose, UA: NEGATIVE mg/dL
Ketones, ur: NEGATIVE mg/dL
Leukocytes, UA: NEGATIVE
pH: 6.5 (ref 5.0–8.0)

## 2011-03-14 NOTE — Telephone Encounter (Signed)
Pt called stating she spoke to a nurse on Monday and pt states she doesn't recall any of the conversation that took place.  Had pain in head, nausea and dizziness episode this a.m.  Pt states she tried to make coffee this morning and could not remember how to work the coffee pot. Advised pt she should be evaluated in the ER. Pt states she does not want to go to the ER as she has been there in the last few months for similar neurologic symptoms and has been evaluated by a neurologist. Advised pt that the ER is the best option at this time as they can run tests to evaluate current symptoms. Advised pt to have someone drive her to the ER or call EMS for transportation.  Pt voices understanding.

## 2011-03-15 DIAGNOSIS — H532 Diplopia: Secondary | ICD-10-CM | POA: Insufficient documentation

## 2011-03-15 HISTORY — DX: Diplopia: H53.2

## 2011-03-15 NOTE — Assessment & Plan Note (Signed)
Symptoms resolved. Consider medication effect. If symptoms recur pursue further evaluation however has had extensive comprehensive evaluation over the last several months with multiple sources of imaging, laboratory evaluation as well as evaluation by neurology, cardiology and psychiatry.

## 2011-03-15 NOTE — Progress Notes (Signed)
  Subjective:    Patient ID: [name], female    DOB: 07-21-1961, 50 y.o.   MRN: [id number]  HPI patient presents to clinic for followup of double vision. Recently seen by a psychiatrist who increased her lip medical dose. Several days ago after this change developed double vision, blurry vision and stumbling. Had not eaten and attempted p.o. With no improvement. Had presyncope without syncope. Did not present to the emergency department. Symptoms lasted approximately 4 hours and resolved after taking a Klonopin. Subsequent stop omental and restarted at a lower dose. Still slightly dizzy but has had no further other symptoms. Presents with FMLA paperwork to be completed for periodic absences.  Reviewed pmh, medications and allergies    Review of Systems see hpi     Objective:   Physical Exam  Nursing note and vitals reviewed. Constitutional: She appears well-developed and well-nourished. No distress.  HENT:  Head: Normocephalic and atraumatic.  Eyes: Conjunctivae are normal. No scleral icterus.  Neurological: She is alert.  Skin: She is not diaphoretic.  Psychiatric: She has a normal mood and affect.          Assessment & Plan:   No problem-specific assessment & plan notes found for this encounter.

## 2011-03-17 ENCOUNTER — Telehealth: Payer: Self-pay | Admitting: *Deleted

## 2011-03-17 NOTE — Telephone Encounter (Signed)
[facility] Triage Call Report Triage Record Num: [id number] Operator: Fernand Parkins Patient Name: [facility] Call Date & Time: 03/16/2011 6:04:53PM Patient Phone: [phone] PCP: Neta Mends. Panosh Patient Gender: Female PCP Fax : [phone] Patient DOB: 08-09-1961 Practice Name: [facility] Reason for Call: memory loss, blurred vision and double vision. THE PATIENT REFUSED 911 LMP s/p hysterectomy. Afebrile. Shanzay/pt calling regarding just wants to let MD know that she was seen in ED on 03/14/11 for impaired memory and impaired memory processing. Pt advised was told to follow-up with pcp, neurologist and optometrist and make an appt to be seen. Pt advises sxs haven't gotten any better but aren't getting any worse either. Pt advises has been treated for same symptoms by MD Hodgin x 1 yr now. Advised pt would need to call pcp back in AM 03/17/11 to make an appt to be seen. Pt verbalized understanding. Pt refused triage. Protocol(s) Used: Office Note Recommended Outcome per Protocol: Information Noted and Sent to Office Reason for Outcome: Caller information to office Care Advice: ~ 03/16/2011 6:25:21PM Page 1 of 1 [email or web address]

## 2011-03-18 ENCOUNTER — Telehealth: Payer: Self-pay | Admitting: *Deleted

## 2011-03-18 NOTE — Telephone Encounter (Signed)
Patient wanted Dr Rodena Medin to be aware that she was seen by a Neurolgist,and she was being referred to a neuro ophthalmologist at [facility]

## 2011-03-31 NOTE — Consult Note (Signed)
NAME:  [name], [name] NO.:  [id number]  MEDICAL RECORD NO.:  [id number]  LOCATION:  [facility]                         FACILITY:  [facility]  PHYSICIAN:  Beryle Beams, MD   DATE OF BIRTH:  [birth year]  DATE OF CONSULTATION:  03/14/2011 DATE OF DISCHARGE:                                CONSULTATION   REQUESTING PHYSICIAN:  Gerhard Munch, MD  REASON FOR CONSULTATION:  Near syncope with diplopia and headache.  HISTORY:  [name] is a 50 year old white female who has a known history of headaches/ migraines as well as bipolar disorder, who is undergoing evaluation for near syncope along with diplopia and mental status changes.  History is according to the patient who is a very good historian and review of the hospital as well as emergency room record.  [name] indicates that she is in her usual state of health until about a week ago.  She was in [place] on vacation and states that she had an acute onset of inability to walk straight with some double vision and feeling as if she was about to pass out.  She apparently thought that this could have been due to low blood sugar and went in to a local bakery and sat down but after eating some sugar, this did not help. Apparently, another pater in the store helped her to her car and actually took her back to hotel room where she stayed until the following day.  She initially thought that this might have been due to a recent increase of her Lamictal and stopped using the Lamictal for about 1 day.  About the next day, she indicates that she was feeling a little bit better, but she still had some unsteadiness which then eventually began to clear a few days ago.  She then saw Dr. Clent Ridges at [facility] earlier in the week who also apparently felt the Lamictal was not the problem and then she contacted her psychiatrist, Dr. Donell Beers, who also felt Lamictal was not the issue.  She began to feel better as the week  progressed but then last night, she began to have recurrences of some nausea as well as a pressure in her head.  By the time she got up this morning, she indicates she has tried to use her coffee maker and was  soaking and was very confused on how to operate this piece of equipment.  This coincided once again with a fairly vague symptoms of head pressure/fullness as well as unsteadiness.  Due to these issues, she then presented to [facility] Emergency Room where she was evaluated further.  In the emergency room, examination was noted to be basically nonfocal although she did complain of some disconjugate gaze.  A head MRI was obtained without contrast revealed no acute reversible process and was unremarkable with the exception of some white matter changes. No orbital lesion was seen but her gaze was described as being "divergent".  Workup was also obtained, it was unremarkable with a negative urine drug screen.  Alcohol level less than 11.  Due to her presentation, Neurology was consulted now for further evaluation.  A pertinent additional note is that [name] does indicate  that she had about a year ago, had difficulties with migraine headaches that lasted only a few months.  She states she was seen by a neurologist at the [facility] who provided her some Imitrex which apparently helped the situation to resolve.  These headaches were very different and that they were very severe with nausea and vomiting would cause her to lie down.  She also apparently was seen in the past within the past year by Dr. Marjory Lies of [facility] for weakness.  She states that nothing was found during that evaluation.  It has been found she has also seen by Dr. Venia Minks in the past for evaluation of head MRI, but once again she was advised with that there is no evidence of neurologic disease such as MS.  PAST MEDICAL HISTORY:  Significant for bipolar disorder as well  as transient history of migraines about a year ago, endometriosis, allergic rhinitis.  I do not see any noted history of diabetes, prior stroke, seizure, kidney or lung problems.  PAST SURGICAL HISTORY:  Includes cholecystectomy, hysterectomy.  MEDICATIONS:  Include aspirin, Protonix, estradiol, Klonopin, Lamictal 300 mg daily, lithium carbonate 600 mg at bedtime.  ALLERGIES:  No known drug allergies.  SOCIAL HISTORY:  She resides locally and works as a Engineer, civil (consulting) at [employer].  She is a former smoker, occasionally consumes alcohol.  FAMILY HISTORY:  Unknown.  REVIEW OF SYSTEMS:  Neurological review of systems as noted above.  A 10- point review of systems is documented in the emergency room notes and reviewed.  I agree with these as noted.  PHYSICAL EXAMINATION:  GENERAL:  Pleasant, cooperative middle-aged white female, who is in no acute distress. VITAL SIGNS:  She is afebrile.  Vital signs stable with a blood pressure 115/72. NEUROLOGIC:  She is currently alert and oriented with a fluent speech.  Able to name, repeat, and follow commands.  Other areas of higher intellectual function appeared to be intact.  Her extraocular movements were intact.  More specifically, I cannot elicit any obvious gaze, nystagmus, or gaze restriction.  Of note is that she had full visual fields of each eye tested independently.  There was noted to be some diplopia that was monocular only in the left eye, looking far left.  This was not present with a right eye.  Pupils were equal, round, and reactive to light. Face is symmetric.  Palate elevates symmetrically.  Tongue protrudes midline.  Otherwise, cranial nerves II through XII appeared to be intact.  Motor examination:  She has normal bulk and tone with 5/5 strength throughout.  Sensory exam reveals symmetric, pinprick, and vibration.  Cerebellar exam:  On finger-to-nose, no evidence of pronator drift, deep tendon reflexes were grade 1+ with toes  downgoing. CARDIOVASCULAR:  Regular rate and rhythm without murmur, rub, or gallop. No evidence of carotid bruits. EXTREMITIES:  No evidence of cyanosis, clubbing, or edema.  LABORATORY STUDIES:  As noted above.  In addition, lithium level was noted to be less than 0.25 (normal 0.8-1.4).  ASSESSMENT: 1. Near syncope with headache, rule out migraine variant. 2. Monocular (left eye) diplopia.  DISCUSSION:  At this time, [name] presents with a prior history of bipolar disorder as well as headaches that she indicates only occurred for 1 year and were consistent with migraines.  She now presents with a constellation of fairly vague symptoms that consisted of generalized weakness/heaviness with head fullness and diplopia that appears to be monocular.  She also had an  episode of also some associated transient confusion this morning as noted above.  Head MRI is unremarkable and her neurologic examination appears to be nonfocal at this time.  Overall, my suspicion is that this may be due to some type of migraine variant.  However, intracranial etiology should probably be excluded.  She is at very minimal vascular risk factors.  The monocular nature of her diplopia from a left eye would suggest an intraocular problem within the left eye.  PLAN:  At this time, I would suggest checking head MRA to exclude a vascular abnormality, although I feel this is unlikely.  An Ophthalmology evaluation could be considered for a left monocular diplopia.  I would also consider trying the headache with cocktail to include Decadron, Toradol, Ativan, Phenergan, and magnesium sulfate to see if this may help alleviate her symptoms.  EEG may also be considered given the transient confusion, however, I also feels it would be a very low yield.  She should probably be advised not to drive at this time given the diplopia and her general near-syncopal sensation.  Thank you very much for allowing me to  participate in the care of this interesting and pleasant patient.          ______________________________ Beryle Beams, MD     RY/MEDQ  D:  03/14/2011  T:  03/15/2011  Job:  [id number]  Electronically Signed by Beryle Beams MD on 03/31/2011 09:50:32 AM

## 2011-04-03 ENCOUNTER — Encounter: Payer: Self-pay | Admitting: Internal Medicine

## 2011-04-03 ENCOUNTER — Ambulatory Visit (INDEPENDENT_AMBULATORY_CARE_PROVIDER_SITE_OTHER): Payer: BC Managed Care – PPO | Admitting: Internal Medicine

## 2011-04-03 VITALS — BP 120/80 | HR 66 | Temp 98.0°F | Wt 134.0 lb

## 2011-04-03 DIAGNOSIS — N809 Endometriosis, unspecified: Secondary | ICD-10-CM

## 2011-04-03 DIAGNOSIS — R269 Unspecified abnormalities of gait and mobility: Secondary | ICD-10-CM

## 2011-04-03 DIAGNOSIS — F319 Bipolar disorder, unspecified: Secondary | ICD-10-CM

## 2011-04-03 DIAGNOSIS — R5383 Other fatigue: Secondary | ICD-10-CM

## 2011-04-03 DIAGNOSIS — R109 Unspecified abdominal pain: Secondary | ICD-10-CM

## 2011-04-03 DIAGNOSIS — M6281 Muscle weakness (generalized): Secondary | ICD-10-CM

## 2011-04-03 DIAGNOSIS — R5381 Other malaise: Secondary | ICD-10-CM

## 2011-04-03 DIAGNOSIS — K5289 Other specified noninfective gastroenteritis and colitis: Secondary | ICD-10-CM

## 2011-04-03 DIAGNOSIS — K529 Noninfective gastroenteritis and colitis, unspecified: Secondary | ICD-10-CM | POA: Insufficient documentation

## 2011-04-03 MED ORDER — ONDANSETRON HCL 4 MG PO TABS: 4.0000 mg | ORAL_TABLET | Freq: Three times a day (TID) | ORAL | Status: DC | PRN

## 2011-04-03 NOTE — Progress Notes (Signed)
Subjective:    Patient ID: [name], female    DOB: Nov 23, 1960, 50 y.o.   MRN: [id number]  HPI Patient comes in today for acute visit for the above problem. She changed primary care doctors to Dr. Rodena Medin and but since he moved back to the [place] office she wishes to come back to the [place] office. Acute onset at work with abd cramps and rfequent lose stools . Had some improvement and then got worse the next day. She took no medication for this. It has been going on for 3 days and she's feeling weaker. There is no vomiting but some nausea. She states that this doesn't feel like her irritable bowel. Denies recent antibiotic urinary tract infection or exposure seems to C. 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 Lin Givens for her multiple neurologic symptoms that she's had off and on for quite a while including an episode of weakness where she couldn't rise from a table and some inability to speak. She has had a history of double vision and has had some ophthalmologic evaluation. It has been suggested she have the LP for further evaluation to make sure this is not a MS  she requests that we call Dr. Lin Givens office about all this.  .    Denies change in her psychiatric medication. The Review of Systems No fever unusual rashes other people sick. She did eat a proper worse it was 4 hours old sitting on the table. Hs had hysterectomy feels tight pressure in her head a lot.   Past history family history social history reviewed in the electronic medical record.     Objective:   Physical Exam Well-developed well-nourished nontoxic but tired appearing in no acute distress. Verbal and oriented.  Looks slightly depressed but good eye contact and though Neck no masses  Chest:  Clear to A&P without wheezes rales or rhonchi CV:  S1-S2 no gallops or murmurs peripheral perfusion is normal Abdomen:  Sof,t normal bowel sounds without  hepatosplenomegaly, no guarding rebound or masses no CVA tenderness  bowel sounds are present area of tenderness she points to lower and left. Neurologic not tested but her gait is normal today and EOMs are normal today. Speech is normal.     Assessment & Plan:  Abdominal cramps and loose stools. With overall weakness/ fatigue  feeling. Hydration appears adequate. Refilled her Zofran. I suspect this acts more like a viral enteral gastroenteritis was possibly augmented symptoms with her underlying IBS endometriosis and medications.  She has seen her gynecologist and her GI doctorin the past in regard to abdominal pains of various sorts.  Ongoing evaluation for neurologic symptoms per patient.  She states that she has seen 3 doctors about these problems and is now seeing Dr. Lin Givens.  At this point in time I do not feel that laboratory studies are warranted however if her stools continue to be a problem would check these for culture and C. difficile and have her followup with her gastroenterologist. Her overall weak feelings appear to be nonfocal.   In regard to her neurologic symptoms. This has been ongoing and I have not been involved with this anytime recently. She is changing back to primary physician in the [place] office. Because of location.    She requested we contact Dr. Lin Givens office. We will do this.    Record review  Prolonged visit  Related to reestablishing with this provider and above issues Total visit  40 mins > 50% spent counseling and coordinating care

## 2011-04-03 NOTE — Patient Instructions (Addendum)
This acts like   Acute gastroneteritis  illness on top of you other medical problems. IBS and endometriosis could contribute to your pain.  For now  Light food and clear liquids.   Ok to take Zofran    Can try probiotics if needed.  Call next week   If not getting better.  And consider stool tests at that time. Consider seeing your Gi doctor.

## 2011-04-06 ENCOUNTER — Telehealth: Payer: Self-pay | Admitting: Gastroenterology

## 2011-04-06 NOTE — Telephone Encounter (Signed)
The patient has been having abdominal pain and nausea for one week. She wired what doctor she should see for this problem. She has seen Dr. Loreta Ave for GI issues in the past and I suggested to her that she call her office in the morning to make an appointment with her.

## 2011-04-08 ENCOUNTER — Encounter: Payer: Self-pay | Admitting: Internal Medicine

## 2011-04-08 ENCOUNTER — Ambulatory Visit (INDEPENDENT_AMBULATORY_CARE_PROVIDER_SITE_OTHER): Payer: BC Managed Care – PPO | Admitting: Internal Medicine

## 2011-04-08 VITALS — BP 110/60 | HR 72 | Wt 131.0 lb

## 2011-04-08 DIAGNOSIS — R51 Headache: Secondary | ICD-10-CM

## 2011-04-08 DIAGNOSIS — F319 Bipolar disorder, unspecified: Secondary | ICD-10-CM

## 2011-04-08 DIAGNOSIS — K529 Noninfective gastroenteritis and colitis, unspecified: Secondary | ICD-10-CM

## 2011-04-08 DIAGNOSIS — K5289 Other specified noninfective gastroenteritis and colitis: Secondary | ICD-10-CM

## 2011-04-08 DIAGNOSIS — N809 Endometriosis, unspecified: Secondary | ICD-10-CM

## 2011-04-08 DIAGNOSIS — R109 Unspecified abdominal pain: Secondary | ICD-10-CM

## 2011-04-08 LAB — CBC WITH DIFFERENTIAL/PLATELET
Basophils Absolute: 0 10*3/uL (ref 0.0–0.1)
Eosinophils Absolute: 0.2 10*3/uL (ref 0.0–0.7)
HCT: 40.8 % (ref 36.0–46.0)
Hemoglobin: 13.8 g/dL (ref 12.0–15.0)
Lymphocytes Relative: 22.7 % (ref 12.0–46.0)
Lymphs Abs: 1.5 10*3/uL (ref 0.7–4.0)
MCHC: 33.8 g/dL (ref 30.0–36.0)
MCV: 92.1 fl (ref 78.0–100.0)
Monocytes Absolute: 0.5 10*3/uL (ref 0.1–1.0)
Neutro Abs: 4.3 10*3/uL (ref 1.4–7.7)
RDW: 12.6 % (ref 11.5–14.6)

## 2011-04-08 LAB — BASIC METABOLIC PANEL
CO2: 25 mEq/L (ref 19–32)
Chloride: 107 mEq/L (ref 96–112)
Sodium: 138 mEq/L (ref 135–145)

## 2011-04-08 LAB — HEPATIC FUNCTION PANEL
Albumin: 4.2 g/dL (ref 3.5–5.2)
Total Protein: 7 g/dL (ref 6.0–8.3)

## 2011-04-08 NOTE — Patient Instructions (Signed)
This still could be   A recovering bowel infection. Will notify you  of labs when available.  think you should probably get better in another week  There is a  Possibility of post infectious irritable bowel.

## 2011-04-08 NOTE — Progress Notes (Signed)
  Subjective:    Patient ID: [name], female    DOB: 1961/01/09, 50 y.o.   MRN: [id number]  HPI 8 days into  Problem  Better than beginning.     After eating pain and   Stool afterward.    Talked with  Gi do and didn't think  gastritis.  No fever and stools some better but not normal No fever hard to lose weight.  In between psych  Needs her klonipen.   Review of Systems No uti sx see above .and last  Note.   No new neuro sx .   Past history family history social history reviewed in the electronic medical record.     Objective:   Physical Exam  WDWN in nad .   Looks  Healthy and better than last visit . Skin nl turgor  No acute rashes  Neck no massses Chest:  Nl respiration Co rr  Non focal neuro  Psych   Oriented x 3 and no noted deficits in memory, attention, and speech.       Assessment & Plan:  Abdominal sx  Bowel changes  Getting better still acts like acute  on chronic   GE on top of  ibs  other . However some risk in health care setting  . Disc getting stool for c diff etc.   Blood work requested  R/o metabolic .   Fatigue   Psych; asks for rx klonipen   rec she get all psycho active meds  from psych she is in the middle of transition  Her last psych at [facility] left  And doesn't have new one yet .  Has seen dr Donell Beers in the past .  rec she call [facility] or dr Donell Beers.   Neuro :   We called dr [name]  office  As she requested but no reply . ?s   Answered as best as possible.

## 2011-04-09 ENCOUNTER — Encounter: Payer: Self-pay | Admitting: *Deleted

## 2011-04-18 ENCOUNTER — Encounter: Payer: Self-pay | Admitting: Internal Medicine

## 2011-04-18 ENCOUNTER — Ambulatory Visit (INDEPENDENT_AMBULATORY_CARE_PROVIDER_SITE_OTHER): Payer: BC Managed Care – PPO | Admitting: Internal Medicine

## 2011-04-18 VITALS — BP 110/60 | HR 72 | Temp 98.0°F | Wt 131.0 lb

## 2011-04-18 DIAGNOSIS — R5381 Other malaise: Secondary | ICD-10-CM

## 2011-04-18 DIAGNOSIS — IMO0002 Reserved for concepts with insufficient information to code with codable children: Secondary | ICD-10-CM

## 2011-04-18 DIAGNOSIS — R229 Localized swelling, mass and lump, unspecified: Secondary | ICD-10-CM

## 2011-04-18 NOTE — Patient Instructions (Signed)
Observation is  Ok for now. If  persistent or progressive or growing.   Then we can do a surgical consult but I think this is safe  To wait and observe and not related to your other symptoms.

## 2011-04-19 DIAGNOSIS — IMO0002 Reserved for concepts with insufficient information to code with codable children: Secondary | ICD-10-CM | POA: Insufficient documentation

## 2011-04-19 NOTE — Progress Notes (Signed)
  Subjective:    Patient ID: [name], female    DOB: Jun 18, 1961, 50 y.o.   MRN: [id number]  HPI Comes in for acute new problem .  Onset noted pain lump right lower back area this week. No fever or rash or change in fatigue sx that she has been having. Need opinion   Could this be related to other sx .  How alarming is this ?could be cancer  Or such.   NO radiating pain fever or redness  hasn't felt this area before.  Has been using lots of FMLA   Dr Rodena Medin has written  For all of her evals.  To go to the beach this weekend with her mom.   Did get her med from psychiatry   See last notes. Review of Systems See past  Notes  fatigue feeling weak all over but no falling fever or rash. Past history family history social history reviewed in the electronic medical record.     Objective:   Physical Exam WDWN in nad looks somewhat tired not ill appearing. Back spine non tender  Right lower lateral back about 2 inc from midline there is a ovoid  3 cm or so deep [place] mass that feels mobile but not fluctuanct and no skin changes.  Not really associated with si joint.  Gait normal today     Assessment & Plan:  Right low back nodular area with tenderness prolong discussion about what this could be . ? If lipoma or other . Not terribly alarming but needs to  follow  . Investigation options discussed  . MRI imaging  Vs surgery consult vs observation for  now.  Little risk in waiting to evaluate  Will follow and call if  persistent or progressive and we can get opinion about this .  Total visit > 50% spent counseling and coordinating care

## 2011-05-07 ENCOUNTER — Telehealth: Payer: Self-pay | Admitting: Internal Medicine

## 2011-05-07 DIAGNOSIS — IMO0002 Reserved for concepts with insufficient information to code with codable children: Secondary | ICD-10-CM

## 2011-05-07 NOTE — Telephone Encounter (Signed)
Pt is calling in, in regards to a referral she had spoke to Dr. Fabian Sharp about in the past due to the lumps on her back. Please contact pt.

## 2011-05-07 NOTE — Telephone Encounter (Signed)
Per Dr. Fabian Sharp- Okay to do surg. Referral.

## 2011-05-08 ENCOUNTER — Telehealth: Payer: Self-pay | Admitting: Internal Medicine

## 2011-05-08 NOTE — Telephone Encounter (Signed)
Order sent to [facility] 

## 2011-05-08 NOTE — Telephone Encounter (Signed)
Computer systems down 9/18 when pt called. States that she had a lumbar puncture x4 today (9/18) & that no spinal fluid got out. Pt has been referred to a fluoroscopy MD. Dr. Lin Givens did lumbar puncture. Dr. Fabian Sharp aware of this call.

## 2011-05-09 ENCOUNTER — Telehealth: Payer: Self-pay | Admitting: *Deleted

## 2011-05-09 NOTE — Telephone Encounter (Signed)
Pt. Was seen in the ER last night with weakness, and dizziness.  A neurologist came down to the ER and did an evaluation, but no diagnosis.  Just given Phenergan.  FYI

## 2011-05-13 ENCOUNTER — Ambulatory Visit (INDEPENDENT_AMBULATORY_CARE_PROVIDER_SITE_OTHER): Payer: BC Managed Care – PPO | Admitting: General Surgery

## 2011-05-13 LAB — COMPREHENSIVE METABOLIC PANEL
ALT: 86 — ABNORMAL HIGH
AST: 45 — ABNORMAL HIGH
Albumin: 2.9 — ABNORMAL LOW
Albumin: 3.4 — ABNORMAL LOW
Alkaline Phosphatase: 67
BUN: 10
Calcium: 8.4
Chloride: 109
Creatinine, Ser: 0.7
GFR calc Af Amer: >60
Potassium: 3.9
Sodium: 139
Total Bilirubin: 0.5
Total Protein: 5.4 — ABNORMAL LOW
Total Protein: 6.2

## 2011-05-13 LAB — CBC
HCT: 40.5
Hemoglobin: 13.2
Hemoglobin: 11.4 — ABNORMAL LOW
MCHC: 33.8
MCV: 91.8
Platelets: 219
Platelets: 271
Platelets: 270
RDW: 12.6
WBC: 3.3 — ABNORMAL LOW
WBC: 4.7

## 2011-05-13 LAB — BASIC METABOLIC PANEL
Calcium: 8.3 — ABNORMAL LOW
Creatinine, Ser: 0.58
GFR calc Af Amer: >60
GFR calc non Af Amer: >60
Glucose, Bld: 110 — ABNORMAL HIGH
Sodium: 138

## 2011-05-13 LAB — DIFFERENTIAL
Basophils Absolute: 0
Basophils Relative: 0
Eosinophils Absolute: 0.3
Eosinophils Relative: 4
Eosinophils Relative: 8 — ABNORMAL HIGH
Lymphs Abs: 1.6
Monocytes Absolute: 0.5
Monocytes Relative: 12
Monocytes Relative: 16 — ABNORMAL HIGH
Neutro Abs: 1.8
Neutro Abs: 2.3

## 2011-05-13 LAB — MONONUCLEOSIS SCREEN: Mono Screen: NEGATIVE

## 2011-05-13 LAB — STOOL CULTURE

## 2011-05-13 LAB — URINALYSIS, ROUTINE W REFLEX MICROSCOPIC
Glucose, UA: NEGATIVE
Hgb urine dipstick: NEGATIVE
Specific Gravity, Urine: 1.028
Urobilinogen, UA: 1

## 2011-05-13 LAB — AMYLASE: Amylase: 86

## 2011-05-13 LAB — OVA AND PARASITE EXAMINATION

## 2011-05-13 LAB — HEPATITIS C ANTIBODY: HCV Ab: NEGATIVE

## 2011-05-13 LAB — CLOSTRIDIUM DIFFICILE EIA: C difficile Toxins A+B, EIA: NEGATIVE

## 2011-05-13 LAB — TSH: TSH: 4.64

## 2011-05-13 LAB — PROTIME-INR: Prothrombin Time: 12.9

## 2011-05-13 LAB — HEPATITIS B CORE ANTIBODY, TOTAL: Hep B Core Total Ab: POSITIVE — AB

## 2011-05-13 LAB — HEPATIC FUNCTION PANEL
Alkaline Phosphatase: 54
Indirect Bilirubin: 0.4
Total Protein: 5.5 — ABNORMAL LOW

## 2011-05-13 LAB — HEPATITIS B SURFACE ANTIGEN: Hepatitis B Surface Ag: NEGATIVE

## 2011-05-20 ENCOUNTER — Telehealth: Payer: Self-pay | Admitting: Internal Medicine

## 2011-05-20 NOTE — Telephone Encounter (Signed)
Pt called and said that she had a spinal lumbar puncture done last Monday 05/12/11. Results came back negative. Just wanted to make Dr Fabian Sharp aware.

## 2011-05-21 ENCOUNTER — Ambulatory Visit (INDEPENDENT_AMBULATORY_CARE_PROVIDER_SITE_OTHER): Payer: BC Managed Care – PPO | Admitting: Surgery

## 2011-05-21 LAB — CBC
MCV: 88.2
RBC: 4.58
WBC: 8.7

## 2011-05-21 LAB — COMPREHENSIVE METABOLIC PANEL
ALT: 21
AST: 30
Albumin: 3.9
Alkaline Phosphatase: 67
CO2: 25
Chloride: 107
GFR calc Af Amer: >60
GFR calc non Af Amer: >60
Potassium: 3.7
Sodium: 139
Total Bilirubin: 0.7

## 2011-05-21 LAB — URINALYSIS, ROUTINE W REFLEX MICROSCOPIC
Glucose, UA: NEGATIVE
Hgb urine dipstick: NEGATIVE
Protein, ur: NEGATIVE
Specific Gravity, Urine: 1.008
Urobilinogen, UA: 0.2

## 2011-05-21 LAB — ETHANOL: Alcohol, Ethyl (B): <5

## 2011-05-21 LAB — DIFFERENTIAL
Basophils Absolute: 0.1
Eosinophils Absolute: 0.3
Eosinophils Relative: 4
Monocytes Absolute: 0.7

## 2011-05-21 LAB — RAPID URINE DRUG SCREEN, HOSP PERFORMED: Barbiturates: NOT DETECTED

## 2011-05-22 ENCOUNTER — Telehealth: Payer: Self-pay | Admitting: *Deleted

## 2011-05-22 NOTE — Telephone Encounter (Signed)
Pt wanted Dr. Fabian Sharp to know her Lumbar Puncture was normal.  Neurology wants her to have cervical and thoracic MRI next.  Will keep [name] updated.   She is no better, maybe worse.  Can barely work 6 hours without extreme fatigue.

## 2011-05-27 ENCOUNTER — Other Ambulatory Visit: Payer: Self-pay | Admitting: Psychiatry

## 2011-05-27 DIAGNOSIS — Q232 Congenital mitral stenosis: Secondary | ICD-10-CM

## 2011-06-02 LAB — POCT URINALYSIS DIP (DEVICE)
Glucose, UA: NEGATIVE
Ketones, ur: 40 — AB
Operator id: 247071
Protein, ur: 30 — AB
Specific Gravity, Urine: 1.015
Urobilinogen, UA: 0.2

## 2011-06-02 LAB — URINE CULTURE

## 2011-06-04 ENCOUNTER — Ambulatory Visit (INDEPENDENT_AMBULATORY_CARE_PROVIDER_SITE_OTHER): Payer: BC Managed Care – PPO | Admitting: Surgery

## 2011-06-11 ENCOUNTER — Ambulatory Visit
Admission: RE | Admit: 2011-06-11 | Discharge: 2011-06-11 | Disposition: A | Discharge status: Home / Self Care | Payer: BC Managed Care – PPO | Source: Ambulatory Visit | Attending: Psychiatry | Admitting: Psychiatry

## 2011-06-11 DIAGNOSIS — Q232 Congenital mitral stenosis: Secondary | ICD-10-CM

## 2011-06-11 MED ORDER — GADOBENATE DIMEGLUMINE 529 MG/ML IV SOLN
13.0000 mL | Freq: Once | INTRAVENOUS | Status: AC | PRN
  Administered 2011-06-11: 13 mL via INTRAVENOUS

## 2011-06-13 ENCOUNTER — Other Ambulatory Visit: Payer: BC Managed Care – PPO

## 2011-07-02 ENCOUNTER — Ambulatory Visit (INDEPENDENT_AMBULATORY_CARE_PROVIDER_SITE_OTHER): Payer: BC Managed Care – PPO | Admitting: General Surgery

## 2011-07-07 ENCOUNTER — Other Ambulatory Visit: Payer: Self-pay | Admitting: Internal Medicine

## 2011-07-07 NOTE — Telephone Encounter (Signed)
Left message for pt to call back. Need to know how often pt is taking medication and up date pt's med list.

## 2011-07-07 NOTE — Telephone Encounter (Signed)
Per Dr. Fabian Sharp ok x 1 only

## 2011-07-07 NOTE — Telephone Encounter (Signed)
Spoke to pt- she is only taken it every once in awhile. The last time she got this refill was 9 months ago. Medication updated.

## 2011-07-15 ENCOUNTER — Ambulatory Visit (INDEPENDENT_AMBULATORY_CARE_PROVIDER_SITE_OTHER): Payer: BC Managed Care – PPO | Admitting: Internal Medicine

## 2011-07-15 ENCOUNTER — Encounter: Payer: Self-pay | Admitting: Internal Medicine

## 2011-07-15 VITALS — BP 110/72 | HR 72 | Temp 98.5°F | Wt 142.0 lb

## 2011-07-15 DIAGNOSIS — T148XXA Other injury of unspecified body region, initial encounter: Secondary | ICD-10-CM

## 2011-07-15 DIAGNOSIS — M6281 Muscle weakness (generalized): Secondary | ICD-10-CM

## 2011-07-15 DIAGNOSIS — IMO0001 Reserved for inherently not codable concepts without codable children: Secondary | ICD-10-CM

## 2011-07-15 DIAGNOSIS — W57XXXA Bitten or stung by nonvenomous insect and other nonvenomous arthropods, initial encounter: Secondary | ICD-10-CM

## 2011-07-15 MED ORDER — DOXYCYCLINE HYCLATE 100 MG PO CAPS: 100.0000 mg | ORAL_CAPSULE | Freq: Two times a day (BID) | ORAL | Status: AC

## 2011-07-15 NOTE — Patient Instructions (Addendum)
Local care    And antibiotic for poss local infection.   Call if needed .

## 2011-07-15 NOTE — Progress Notes (Signed)
  Subjective:    Patient ID: [name], female    DOB: 05-Jan-1961, 50 y.o.   MRN: [id number]  HPI Patient comes in today for SDA  For acute problem evaluation.  Tick bite 11 24 noted  Removed small tick and thinks got it all however now getting redder and firm tender no fever other systemic sx. Says tick small.  No em rashes  Using antibiotic topical and  Topical bandaid Stopped Lamictal and thinks muscle se are better . To see new psych at some point.  Review of Systems No fever joint swelling  New gi gu issues Past history family history social history reviewed in the electronic medical record.     Objective:   Physical Exam WDW N in and Left chest shows a 8 mm  Redness with central pustule discolored but no fb seen no abscess. No other rashes Neck: Supple without adenopathy or masses or bruits Oriented x 3 and no noted deficits in memory, attention, and speech.      Assessment & Plan:  Tick bite with local reaction poss early infection  Not felt to be imbedded but options discussed  Antibiotic written local care   doxycycline  Contact us prn

## 2011-07-17 DIAGNOSIS — W57XXXA Bitten or stung by nonvenomous insect and other nonvenomous arthropods, initial encounter: Secondary | ICD-10-CM | POA: Insufficient documentation

## 2011-07-17 NOTE — Assessment & Plan Note (Signed)
Pt feels   That from lamictal and better since she stopped

## 2011-07-18 ENCOUNTER — Telehealth: Payer: Self-pay

## 2011-07-18 ENCOUNTER — Ambulatory Visit (INDEPENDENT_AMBULATORY_CARE_PROVIDER_SITE_OTHER): Payer: BC Managed Care – PPO | Admitting: Internal Medicine

## 2011-07-18 DIAGNOSIS — Z23 Encounter for immunization: Secondary | ICD-10-CM

## 2011-07-18 NOTE — Telephone Encounter (Signed)
Pt called and states she was seen on Tuesday and given doxycycline for a tick bite.  Pt states she has gotten worse and has flu like symptoms such as achy body and vomiting. Pt would like to know if she needs to be tested for lyme disease?  Pt states she has not gotten her flu shot yet. Pls advise.

## 2011-07-18 NOTE — Telephone Encounter (Signed)
Pt called again and stated that her condition has worsened. Thanks.

## 2011-07-18 NOTE — Telephone Encounter (Signed)
Per Dr. Fabian Sharp- too early to check serology but taking doxycycline is the best rx. Make sure she is taking with h2o.  Pt aware of this. She is not running a high fever.

## 2011-07-29 ENCOUNTER — Telehealth: Payer: Self-pay

## 2011-07-29 NOTE — Telephone Encounter (Signed)
Over the last few weeks pt has been experiencing numbness and tingling in her legs, feet, face, and scalp.  Pt states she needs a referral to a women neurologist.  Pls advise.

## 2011-07-30 NOTE — Telephone Encounter (Signed)
Per Dr. Fabian Sharp doxycyline was adequate treatment for pt's tick bite.  Pt should keep appointment with neurologist in [place].  If pt's symptoms worsen pt should go to ER for treatment.

## 2011-07-30 NOTE — Telephone Encounter (Signed)
Pt aware.

## 2011-07-30 NOTE — Telephone Encounter (Signed)
Pt called back and states she worked last night and she has numbness and tingling down head, face, legs, and feet and it has gotten worse.  Pt states she has talked to a couple of doctor's offices for what she needs to do.  Pt did research and found out it could cause peripheral neuropathy.  Pt was not tested for lyme disease.  These symptoms have come about in the last 24 to 48 hours.

## 2011-08-04 ENCOUNTER — Telehealth: Payer: Self-pay | Admitting: *Deleted

## 2011-08-04 NOTE — Telephone Encounter (Signed)
Refill on clonazepam 1mg  last filled 07/03/2011 #45

## 2011-08-05 MED ORDER — CLONAZEPAM 1 MG PO TABS: 1.0000 mg | ORAL_TABLET | Freq: Every day | ORAL | Status: DC

## 2011-08-05 NOTE — Telephone Encounter (Signed)
Call in #30 with no rf  

## 2011-08-05 NOTE — Telephone Encounter (Signed)
rx called in

## 2011-08-11 ENCOUNTER — Encounter: Payer: Self-pay | Admitting: Internal Medicine

## 2011-08-11 ENCOUNTER — Ambulatory Visit (INDEPENDENT_AMBULATORY_CARE_PROVIDER_SITE_OTHER): Payer: BC Managed Care – PPO | Admitting: Internal Medicine

## 2011-08-11 VITALS — BP 120/70 | HR 58 | Temp 98.0°F | Wt 142.0 lb

## 2011-08-11 DIAGNOSIS — F319 Bipolar disorder, unspecified: Secondary | ICD-10-CM

## 2011-08-11 DIAGNOSIS — B9789 Other viral agents as the cause of diseases classified elsewhere: Secondary | ICD-10-CM

## 2011-08-11 DIAGNOSIS — F4321 Adjustment disorder with depressed mood: Secondary | ICD-10-CM

## 2011-08-11 DIAGNOSIS — B349 Viral infection, unspecified: Secondary | ICD-10-CM | POA: Insufficient documentation

## 2011-08-11 DIAGNOSIS — J988 Other specified respiratory disorders: Secondary | ICD-10-CM

## 2011-08-11 DIAGNOSIS — R5381 Other malaise: Secondary | ICD-10-CM

## 2011-08-11 DIAGNOSIS — R5383 Other fatigue: Secondary | ICD-10-CM

## 2011-08-11 NOTE — Patient Instructions (Signed)
Change to  Sugar free lemon drop s instead of cough drops   I think this is  Viral and  Cough  May last 7-14 days but the  Body aches and fatigue should get better   In the next 48 hours .  Call if this is not getting better .Viral illnesses do not get better  with an antibiotic.

## 2011-08-11 NOTE — Progress Notes (Signed)
  Subjective:    Patient ID: [name], female    DOB: 1961/01/08, 50 y.o.   MRN: [id number]  HPI  Patient comes in today for SDA  For acute problem evaluation. Onset days of illness.   Many  Have illness at work. Upper respiratory congestion and coughing and malaise. No chills or high fever. Since her last visit she did see the neurologist about facial numbness and apparently had an MRI done. She was also tested for Lyme and I guess that was negative.  Put on paxil  10 mg by psychiatry which so far helps.   Mom  Has MAC    Concern about germs   To go home for x mas  Review of Systems No ha vision change vomiting  " Got testsed for lyme " saw neurologost and had MRI  No dx  Not normal Brainstem but no dx   no current new rashes syncope wheezing.  Past history family history social history reviewed in the electronic medical record.       Objective:   Physical Exam WDWN  Non toxic in nad  Mildly congested WDWN in NAD  quiet respirations; mildly congested  somewhat hoarse. Non toxic . HEENT: Normocephalic ;atraumatic , Eyes;  PERRL, EOMs  Full, lids and conjunctiva clear,,Ears: no deformities, canals nl, TM landmarks normal, Nose: no deformity or discharge but mildly congested face nontender Mouth : OP clear without lesion or edema . Mild erythema no exudate  Neck: Supple without adenopathy or masses or bruits Chest:  Clear to A&P without wheezes rales or rhonchi CV:  S1-S2 no gallops or murmurs peripheral perfusion is normal Skin :nl perfusion and no acute rashes       Assessment & Plan:  Acute rti prob viral by context and exam  She has had her flu shot but exposed to the hospital setting. He asks about a Z-Pak but at this point in time I don't see evidence of a bacterial infection.   Avoid antibiotic  For viral  Disc  Alarm features for other interventions.   Expectant management.  I.e. get signs of pneumonia et Karie Soda.  Discussed symptomatic treatment at this time. She  feels that even a viral respiratory illness causes more symptoms for her on top of her underlying symptomatology of unknown cause. I agree. I do think this will be a self resolving illness.    Total visit > 50% spent counseling and coordinating care

## 2011-08-14 ENCOUNTER — Telehealth: Payer: Self-pay | Admitting: Internal Medicine

## 2011-08-14 NOTE — Telephone Encounter (Signed)
Pt was in for ov on Monday 08/11/11 and said that she is not getting any better. Glands are swollen and sore. Pt is weak and achy. Pt is wondering what Dr Fabian Sharp would recommend he to do.

## 2011-08-14 NOTE — Telephone Encounter (Signed)
Left message for pt to call back to get more info on what is going on.

## 2011-08-14 NOTE — Telephone Encounter (Signed)
Spoke to pt- she is having drainage in the back of her throat, weak and keeps coughing. She felt better after on Tues but then got worse. Doesn't take her temp. Gets hot but doesn't feel feverish. Does feel chilled at times.

## 2011-08-15 ENCOUNTER — Encounter: Payer: Self-pay | Admitting: *Deleted

## 2011-08-15 NOTE — Telephone Encounter (Signed)
Left a message for pt to return call 

## 2011-08-15 NOTE — Telephone Encounter (Signed)
If  Feel she is Relapsing  From initial illness and poss sinus infection (lasting over a week )   Can add augmentin 875 po bid for 10 days  Disp 20 however this could still be CW viral resp infection. And she can wait to start this  For another few days if no fever .

## 2011-09-12 ENCOUNTER — Telehealth: Payer: Self-pay | Admitting: *Deleted

## 2011-09-12 NOTE — Telephone Encounter (Signed)
Dr Fabian Sharp would like to see the hospital discharge summary before prescribing vitamin D.

## 2011-09-12 NOTE — Telephone Encounter (Signed)
Pt was told at [facility] to take 50 thousand units of Vit D weekly when she was discharged, but was not given a prescription.  Is asking if Dr. Fabian Sharp will write this for her.?

## 2011-09-15 NOTE — Telephone Encounter (Signed)
Pt to call the hospital to get [place] a copy of this discharge summary then we can see where we can schedule her for an appt.

## 2011-09-15 NOTE — Telephone Encounter (Signed)
Pt was discharge from [facility] on 09-10-2011. Pt need post hos fup. Where can I work her in ?

## 2011-09-15 NOTE — Telephone Encounter (Signed)
I believe she was admitied for psychiatric admission  And unsure what medical follow up needed until get the hosp record .  Need labs done including vit d level.

## 2011-09-16 ENCOUNTER — Encounter: Payer: Self-pay | Admitting: *Deleted

## 2011-09-16 ENCOUNTER — Ambulatory Visit: Payer: BC Managed Care – PPO | Admitting: Internal Medicine

## 2011-09-16 ENCOUNTER — Telehealth: Payer: Self-pay | Admitting: *Deleted

## 2011-09-16 NOTE — Telephone Encounter (Signed)
Pt thinks she is going thru withdrawals klonopin. She did have vomiting and headaches. She called her on call md psych in town and they think she has the norovirus over the weekend since she had abd. pain. She is now just having a headaches and no vomiting. Pt did state they drew labs and started her vitamin d 50,000. Pt is having a headache across her head. Pt is going to use her migraine meds for the headaches. We are going to call [facility] to get her labs and try to work her in as soon as we can once we get the labs. Pt is fine with cancelling the appt. She is aware that there is not much we can do if she does have the norovirus except for her to drink plenty of fluids and since she is not vomiting, it doesn't sound like the norovirus.

## 2011-09-17 NOTE — Telephone Encounter (Signed)
[  patient was admitted to [facility]  In the behavioral med based on the brief summary . I have mentions no sig medical issues . On discharge.   Will await the  Info labs etc to decide on further  Work up.  In the meantime advise close follow up with her Local psychiatrist as medications certainly could cause many side effects and issues.

## 2011-09-22 ENCOUNTER — Ambulatory Visit: Payer: BC Managed Care – PPO | Admitting: Family

## 2011-09-22 ENCOUNTER — Telehealth: Payer: Self-pay | Admitting: Internal Medicine

## 2011-09-22 ENCOUNTER — Telehealth: Payer: Self-pay | Admitting: Family Medicine

## 2011-09-22 NOTE — Telephone Encounter (Signed)
Pt states she is really jittery and has left a message for PA that she is seeing in [place].  Pt state she thinks Trazodone is causing her to be manic jittery.  Pt states she would like to have blood pressure.  checked as well as an overall check.  Pt states she is not sleeping at night and is taking Benadryl at night to help sleep.  Pt states psychiatry wanted to see pt today but she had a mandatory class. Pt states she has not had a chance to see psychiatry.    Called pt and made aware of Dr. Rosezella Florida recommendations.  Pt offered an ov on 09/23/11 and pt declined due to available times.

## 2011-09-22 NOTE — Telephone Encounter (Signed)
[facility] Triage Call Report Triage Record Num: [id number] Operator: Claudie Leach Patient Name: [facility] Call Date & Time: 09/20/2011 4:07:15AM Patient Phone: [phone] PCP: Neta Mends. Panosh Patient Gender: Female PCP Fax : [phone] Patient DOB: 10/12/1960 Practice Name: [facility] Reason for Call: Caller: Krystena/Patient; PCP: [name].; CB#: [phone]; Call regarding Weaned off Klonopin and it has been 4 days since she took the last dose. Now having problems sleeping, pins and needles sensation in arms and legs, Urinary Incontinence, headache. Also having a jittery edgy feeling. Pulse now is 74. Was Vomting on 09/13/11. Diarrhea started on 09/13/11 and is having about 2 or 3 a day. Caller instructed to go to ER for evaluation. Caller states she is not going to the ER. Caller states when she was in the hospital the last time, [facility] refused to pay for the last 5 days of her hospitalization. Caller states she wakes up during the night with this Apnea Sensation. All Emergent sx of Sleep Disorders r/o. Home care advice given for Sleep Deprivation and multiple episodes of apnea per night. Caller instructed to call office this am for appt for today. Protocol(s) Used: Sleep Disorders Recommended Outcome per Protocol: See Provider within 24 hours Reason for Outcome: Sleep deprivation AND irritable, agitated or multiple episodes of apnea per night Care Advice: ~ Another adult should drive. ~ Should not be alone, arrange for support (family member, friend, etc.). ~ Call provider if symptoms worsen or new symptoms develop. Avoid the use of stimulants including caffeine (coffee, some soft drinks, some energy drinks, tea and chocolate), cocaine, and amphetamines. Also avoid drinking alcohol. ~ ~ SYMPTOM / CONDITION MANAGEMENT ~ CAUTIONS ~ List, or take, all current prescription(s), nonprescription or alternative medication(s) to provider for evaluation.

## 2011-09-22 NOTE — Telephone Encounter (Signed)
Pt has been on klonopin for 14 yrs. Pt is having withdrawal symptoms. Pt appt was cancelled today. Alisha please call this patient today.

## 2011-09-22 NOTE — Telephone Encounter (Signed)
Reported to me by scheduler that she was going through withdrawal of klonipen .   i advised  That she should see or contact her managing psychiatrist.   She was recently hospitalized  for depression  [facility] January. We did get her labs back from the hospital and  They are good except the vitamin d was low.  Advise 50000IU vit d q week and to check a level in 2 months ( vit d)   If she is having a problem not related to her psych meds. then we can see her for that.  Also.

## 2011-09-23 ENCOUNTER — Telehealth: Payer: Self-pay | Admitting: *Deleted

## 2011-09-23 ENCOUNTER — Encounter: Payer: Self-pay | Admitting: Internal Medicine

## 2011-09-23 ENCOUNTER — Ambulatory Visit (INDEPENDENT_AMBULATORY_CARE_PROVIDER_SITE_OTHER): Payer: BC Managed Care – PPO | Admitting: Internal Medicine

## 2011-09-23 VITALS — BP 100/80 | HR 78 | Temp 98.6°F | Wt 138.0 lb

## 2011-09-23 DIAGNOSIS — G479 Sleep disorder, unspecified: Secondary | ICD-10-CM

## 2011-09-23 DIAGNOSIS — F319 Bipolar disorder, unspecified: Secondary | ICD-10-CM

## 2011-09-23 DIAGNOSIS — R030 Elevated blood-pressure reading, without diagnosis of hypertension: Secondary | ICD-10-CM | POA: Insufficient documentation

## 2011-09-23 DIAGNOSIS — R195 Other fecal abnormalities: Secondary | ICD-10-CM | POA: Insufficient documentation

## 2011-09-23 DIAGNOSIS — E559 Vitamin D deficiency, unspecified: Secondary | ICD-10-CM | POA: Insufficient documentation

## 2011-09-23 DIAGNOSIS — G478 Other sleep disorders: Secondary | ICD-10-CM

## 2011-09-23 HISTORY — DX: Vitamin D deficiency, unspecified: E55.9

## 2011-09-23 NOTE — Patient Instructions (Signed)
It seems  That some of your sx could be transition of your meds but unsure.  I agree that you should  Ask ? About the meds that you are on.    Disc what to do on the sleep  At this point. Do not think labs are needed at this time.

## 2011-09-23 NOTE — Progress Notes (Signed)
  Subjective:    Patient ID: [name], female    DOB: [birth year], 51 y.o.   MRN: [id number]  HPI Patient comes in today because of concern about blood pressure and symptoms that could be from medication changes.  C. telephone encounters.   She was hospitalized in January for depression. She was discharged on lithium trazodone and her estrogen. She been on Klonopin for 12+ years and was weaning off however they stopped at discharge.   And when she took trazodone for sleep at night and actually grabbed her up.    he has had some headaches jitteriness difficult sleeping and mind racing feels somewhat manic and a little bit paranoid. She has some loose stools that she does not think is from infection.  She is to see psychiatry practitioner with Dr. Brendia Sacks tomorrow. She wanted to ask opinion about things.  In hospital had laboratory studies normal except for a low vitamin D level.   Review of Systems  negative weight loss fevers blood pressure was elevated when taken by someone last night at 149.  She hasn't gone back to work he is hesitant o work  right now because of judgment in how she feels in the way she feels. Past history family history social history reviewed in the electronic medical record.     Objective:   Physical Exam  well-developed well-nourished in no acute distress alert with no obvious fasciculations or tremors.   oriented times 3 repeat blood pressure as above cardiac regular rhythm . Reviewed hospital admission do not have discharge summary and laboratory studies from her hospitalization  At [facility].  Oriented x 3 and no noted deficits in memory, attention, and speech.her speech appears to be normal and not forced       Assesment & Plan:   Elevated blood pressure reading x1, earache feeling difficulty sleeping  Possibly bipolar disease worse through transition possible withdrawal from Klonopin up to quick unclear if side effects from trazodone or during transition  sleep has been problematic.  Discussed with Corrie Dandy I don't see a medical investigation or intervention that would be helpful. It is imperative that she sees her local behavioral health prescriber to help with medication management. 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  I suspect most of her symptoms are related to transition of medication. Encouraged you to continue for continuity of care at one provider.   Vit d deficincy  Take vit d 50000 iu q week and check level in  2 months as planned   Total visit > 50% spent counseling and coordinating care

## 2011-09-23 NOTE — Telephone Encounter (Signed)
Bp 149/81- not sleeping, shaky, loose stools. Seeing psych tomorrow. Still having loose stools the worse she had it last night. She thinks she is having DT's coming off klonopin.

## 2011-09-23 NOTE — Telephone Encounter (Signed)
Tried to call pt to get a better idea of what is going on with her and how high her bp is. Left a message to call back about this.

## 2011-09-29 ENCOUNTER — Ambulatory Visit: Payer: BC Managed Care – PPO | Admitting: Internal Medicine

## 2011-09-29 ENCOUNTER — Telehealth: Payer: Self-pay | Admitting: *Deleted

## 2011-09-29 DIAGNOSIS — Z0279 Encounter for issue of other medical certificate: Secondary | ICD-10-CM

## 2011-09-29 NOTE — Telephone Encounter (Signed)
Pt brought a form to return to work. Please advise if you can do this or if you want psych to do this?

## 2011-09-29 NOTE — Telephone Encounter (Signed)
Form completed in regard to work return  Please scan  Document in EHR

## 2011-10-27 ENCOUNTER — Ambulatory Visit (INDEPENDENT_AMBULATORY_CARE_PROVIDER_SITE_OTHER): Payer: BC Managed Care – PPO | Admitting: Internal Medicine

## 2011-10-27 ENCOUNTER — Encounter: Payer: Self-pay | Admitting: Internal Medicine

## 2011-10-27 VITALS — BP 130/82 | HR 78 | Temp 98.4°F | Wt 142.0 lb

## 2011-10-27 DIAGNOSIS — R209 Unspecified disturbances of skin sensation: Secondary | ICD-10-CM

## 2011-10-27 DIAGNOSIS — R202 Paresthesia of skin: Secondary | ICD-10-CM | POA: Insufficient documentation

## 2011-10-27 DIAGNOSIS — H532 Diplopia: Secondary | ICD-10-CM

## 2011-10-27 DIAGNOSIS — Z79899 Other long term (current) drug therapy: Secondary | ICD-10-CM | POA: Insufficient documentation

## 2011-10-27 DIAGNOSIS — F319 Bipolar disorder, unspecified: Secondary | ICD-10-CM

## 2011-10-27 DIAGNOSIS — E559 Vitamin D deficiency, unspecified: Secondary | ICD-10-CM

## 2011-10-27 NOTE — Patient Instructions (Signed)
Will notify you  of labs when available. Agree with  Neuro consult at [facility]

## 2011-10-27 NOTE — Progress Notes (Signed)
  Subjective:    Patient ID: [name], female    DOB: 1961-03-09, 51 y.o.   MRN: [id number]  HPI Patient comes in today for an acute visit.  Since her last visit last month  she has been able to totally withdraw from the Klonopin in seeing a psychiatrist a Dr. Elmore Guise office locally. She is only on the lithium at this time and estrogen and her vitamin D.  Her mood is much better has more mental energy and thinking clearer. . However she has a new symptom that concerns her and would like a referral to the neurologist in [facility] as they have all of her records that they compiled when she was hospitalized on the psychiatric unit within the last year.  She's had the onset of ? Last week of tingling feeling all over her face like  numbness not going away.   Last night had astabbing pain left head.  Not a migraine. Currently not present No fever . No cough .  Lithium  And estrogen.  Off of clonipen since  Over a month almost 2 .   Needs vit d level done also Review of Systems Neg cp sob syncope  Vision change gest double visionwith some gazes  Has had mris in the past.   Past history family history social history reviewed in the electronic medical record.  Outpatient Prescriptions Prior to Visit  Medication Sig Dispense Refill  . estradiol (ESTRACE) 1 MG tablet TAKE 1 TABLET BY MOUTH DAILY  30 tablet  5  . lithium 600 MG capsule Take 600 mg by mouth at bedtime.        . ondansetron (ZOFRAN) 4 MG tablet Take 1 tablet (4 mg total) by mouth every 8 (eight) hours as needed.  20 tablet  1  . promethazine (PHENERGAN) 25 MG tablet TAKE 1 TABLET EVERY 4-6 HOURS AS NEEDED FOR NAUSEA AND VOMITING  20 tablet  0  . SUMAtriptan (IMITREX) 100 MG tablet Take 100 mg by mouth every 2 (two) hours as needed.             Objective:   Physical Exam WDWN in nad HEENT [place] eyes perrl eoms :abnormal left drifts in at  superior gaze  Left  Decrease lateral gaze  (EOMS can give her double vision.) Op  clear tongue midline no fasciculations  CN otherwise seem intact Neck: Supple without adenopathy or masses or bruits Chest:  Clear to A&P without wheezes rales or rhonchi CV:  S1-S2 no gallops or murmurs peripheral perfusion is normal NEURO oriented x 3 non focal except eoms  DTS 3 + patellar rest 2+  No cogwheeling or dystonia  Gait normal Psych oriented x 3  More animated today but nl speech and  Affect not  Depressed appearing  . Denies hypomanic sx.      Assessment & Plan:  Facial numb feeling  ? Cause   Not focal  R/o metabolic  No motor issues seen on exam.   Abnormal  EOM but this is apparently old  And prev seen by neuro.  Taking  benadryl for sleep sometimes  . Off of benzos and feels much better mood and energy cognitively.   Various neuro sx in the past  And eval   Ok to refer to [facility] as they obtained her neuro records when she was hospitalized on psych unit.   Bipolar under rx   Vit d defic  Check today

## 2011-10-28 LAB — VITAMIN D 25 HYDROXY (VIT D DEFICIENCY, FRACTURES): Vit D, 25-Hydroxy: 58 ng/mL (ref 30–89)

## 2011-10-28 LAB — BASIC METABOLIC PANEL
CO2: 26 mEq/L (ref 19–32)
Calcium: 9.6 mg/dL (ref 8.4–10.5)
Creatinine, Ser: 0.7 mg/dL (ref 0.4–1.2)
GFR: 94.21 mL/min (ref >60.00)
Sodium: 138 mEq/L (ref 135–145)

## 2011-10-28 LAB — LITHIUM LEVEL: Lithium Lvl: 0.38 mEq/L — ABNORMAL LOW (ref 0.80–1.40)

## 2011-10-28 LAB — MAGNESIUM: Magnesium: 2 mg/dL (ref 1.5–2.5)

## 2011-10-30 ENCOUNTER — Encounter: Payer: Self-pay | Admitting: *Deleted

## 2011-10-30 NOTE — Progress Notes (Signed)
Quick Note:    Letter sent to pt.  ______

## 2011-11-05 ENCOUNTER — Telehealth: Payer: Self-pay | Admitting: Internal Medicine

## 2011-11-05 DIAGNOSIS — E559 Vitamin D deficiency, unspecified: Secondary | ICD-10-CM

## 2011-11-05 NOTE — Telephone Encounter (Signed)
Because her vit d level is better .  Would decrease dosing to  Every other week for 2 months and then recheck vit d level   Thus dispense # 4   Vit d 50 ,000 and take every other week  appt for vit d level in 2 months.

## 2011-11-06 NOTE — Telephone Encounter (Signed)
Rx sent to pharmacy. Order placed in epic for vitamin d level. Left message on machine about this.

## 2011-11-07 ENCOUNTER — Ambulatory Visit (INDEPENDENT_AMBULATORY_CARE_PROVIDER_SITE_OTHER): Payer: BC Managed Care – PPO | Admitting: Internal Medicine

## 2011-11-07 ENCOUNTER — Encounter: Payer: Self-pay | Admitting: Internal Medicine

## 2011-11-07 VITALS — BP 120/78 | HR 82 | Temp 97.8°F | Wt 141.0 lb

## 2011-11-07 DIAGNOSIS — R202 Paresthesia of skin: Secondary | ICD-10-CM

## 2011-11-07 DIAGNOSIS — R1319 Other dysphagia: Secondary | ICD-10-CM | POA: Insufficient documentation

## 2011-11-07 DIAGNOSIS — R209 Unspecified disturbances of skin sensation: Secondary | ICD-10-CM

## 2011-11-07 NOTE — Progress Notes (Signed)
  Subjective:    Patient ID: [name], female    DOB: 06-Jan-1961, 51 y.o.   MRN: [id number]  HPI Patient comes in for SDA today . Advised by triage to do this. She continues to have a tingling funny feeling on her left side of her face. However most recently is beginning to have pressure in her throat and feeling like it's difficult to swallow. She doesn't have shortness of breath and no excessive cough. However when she lays down flat it feels like she's not going to be able to catch her breath so she sleeping up on pillows. There is no vomiting there is some nausea no water brash. She took Zofran once.   Her other neurologic symptoms are no change there is no fever.  We have attempted to get her in to neurology at [facility].  They may not accept her that there was miscommunication about the reason for the referral.   Review of Systems No fever vomiting syncope no change in her vision still has double vision with EOMs. No change in her medication. She is worried about losing her job her too many occurrences.  Past history family history social history reviewed in the electronic medical record. Outpatient Prescriptions Prior to Visit  Medication Sig Dispense Refill  . estradiol (ESTRACE) 1 MG tablet TAKE 1 TABLET BY MOUTH DAILY  30 tablet  5  . lithium 600 MG capsule Take 600 mg by mouth at bedtime.        . ondansetron (ZOFRAN) 4 MG tablet Take 1 tablet (4 mg total) by mouth every 8 (eight) hours as needed.  20 tablet  1  . promethazine (PHENERGAN) 25 MG tablet TAKE 1 TABLET EVERY 4-6 HOURS AS NEEDED FOR NAUSEA AND VOMITING  20 tablet  0  . SUMAtriptan (IMITREX) 100 MG tablet Take 100 mg by mouth every 2 (two) hours as needed.        . Vitamin D, Ergocalciferol, (DRISDOL) 50000 UNITS CAPS Take 1 every other week for 2 months  4 capsule  0       Objective:   Physical Exam Well-developed well-nourished in no acute distress there is no drooling or respiratory distress. HEENT  normocephalic facial asymmetry EOMs abnormal as before Tongue is midline but somewhat dystonic uvula midline airway is clear Neck supple no obvious masses thyroid may be palpable suprasternal notch. No bruits are heard Chest clear to auscultation cardiac S1-S2 no gallops murmurs  When she lays down she looks comfortable breathing and there is no stridor.  See ct neck thorax  In the fall.       Assessment & Plan:  Swallowing   Pressure .  On throat neck that is positional.  This is a new symptom in addition to her left-sided face tingling. I am not sure what the cause of these things are. I do have some concern about her positional airway issue. I suppose she could have a retro-sternal goiter versus other causes of dysphasia. She looks stable today. I suppose she could have atypical symptoms for reflux also.  Will get a CT scan of the neck to look at airway consider barium swallow or ENT eval.  Neurologic consult pending.  I do not have all the record s of her previous  evalas

## 2011-11-07 NOTE — Patient Instructions (Signed)
Will arrange for neck ct   Then plan follow up consider getting ent tosee you vs looking at a swallowing study barium study.   In the meantime  contact on call service ir getting worse.

## 2011-11-11 ENCOUNTER — Other Ambulatory Visit: Payer: BC Managed Care – PPO

## 2011-11-13 ENCOUNTER — Ambulatory Visit (INDEPENDENT_AMBULATORY_CARE_PROVIDER_SITE_OTHER)
Admission: RE | Admit: 2011-11-13 | Discharge: 2011-11-13 | Disposition: A | Discharge status: Home / Self Care | Payer: BC Managed Care – PPO | Source: Ambulatory Visit | Attending: Internal Medicine | Admitting: Internal Medicine

## 2011-11-13 DIAGNOSIS — R209 Unspecified disturbances of skin sensation: Secondary | ICD-10-CM

## 2011-11-13 DIAGNOSIS — R202 Paresthesia of skin: Secondary | ICD-10-CM

## 2011-11-13 DIAGNOSIS — R1319 Other dysphagia: Secondary | ICD-10-CM

## 2011-11-15 ENCOUNTER — Encounter (HOSPITAL_COMMUNITY): Payer: Self-pay

## 2011-11-15 ENCOUNTER — Emergency Department (HOSPITAL_COMMUNITY)
Admission: EM | Admit: 2011-11-15 | Discharge: 2011-11-15 | Disposition: A | Discharge status: Home / Self Care | Payer: BC Managed Care – PPO | Attending: Emergency Medicine | Admitting: Emergency Medicine

## 2011-11-15 DIAGNOSIS — R209 Unspecified disturbances of skin sensation: Secondary | ICD-10-CM | POA: Insufficient documentation

## 2011-11-15 DIAGNOSIS — Z79899 Other long term (current) drug therapy: Secondary | ICD-10-CM | POA: Insufficient documentation

## 2011-11-15 DIAGNOSIS — F319 Bipolar disorder, unspecified: Secondary | ICD-10-CM | POA: Insufficient documentation

## 2011-11-15 DIAGNOSIS — K589 Irritable bowel syndrome without diarrhea: Secondary | ICD-10-CM | POA: Insufficient documentation

## 2011-11-15 DIAGNOSIS — R1319 Other dysphagia: Secondary | ICD-10-CM | POA: Insufficient documentation

## 2011-11-15 LAB — URINALYSIS, ROUTINE W REFLEX MICROSCOPIC
Bilirubin Urine: NEGATIVE
Glucose, UA: NEGATIVE mg/dL
Ketones, ur: NEGATIVE mg/dL
Leukocytes, UA: NEGATIVE
Nitrite: NEGATIVE
Specific Gravity, Urine: 1.019 (ref 1.005–1.030)
pH: 7 (ref 5.0–8.0)

## 2011-11-15 LAB — GLUCOSE, CAPILLARY: Glucose-Capillary: 91 mg/dL (ref 70–99)

## 2011-11-15 NOTE — ED Notes (Signed)
Pt. Had  A CT of her throat this past Thursday, Dr. Lebron Quam, [facility].  Also going to the neurology clinic at [facility]

## 2011-11-15 NOTE — ED Provider Notes (Addendum)
History     [name]: [id number]  Arrival date & time 11/15/11  1641   First MD Initiated Contact with Patient 11/15/11 2001      Chief Complaint  Patient presents with  . Numbness    (Consider location/radiation/quality/duration/timing/severity/associated sxs/prior treatment) HPI Comments: The patient is a pleasant 51 year old female with a past medical history of bipolar disease, irritable bowel syndrome who has been seeing her primary care doctor, Dr. Fabian Sharp for similar symptoms as those that she presents for today, over the last few months she tells me. She has had a strange sensation at times when she lays back supine, like her throat is closing up, she will be able to breathe or swallow, becomes anxious and panicked, however when she sits upright the symptoms are alleviated. She denies any throat or neck pain or injury. Her primary care doctor is evaluated her for this previously, and 2 days ago the patient had a CT scan of her soft tissues of the neck to evaluate for structural abnormality, and there were no abnormal findings. The patient has also been experiencing intermittent episodes of paresthesia on both sides of her face, both upper extremities, intermittent, moderate in severity, without weakness or pain, without rash, and which resolved spontaneously. The patient has been referred to neurology evaluation in the coming week for this problem. This is not a new problem. Neither problem is new or changed. The patient presented to the emergency department tonight, because she had the sensation of her throat closing again, and although it went away when she sat up as it usually does, she was frustrated by it and concerned and presented to the emergency department to assure that there was no suggestion that this would happen again or that she would stop breathing. She does report that at present she has some mild paresthesia at her bilateral cheeks and over the radial surface of her upper  extremities bilaterally. She has no weakness, denies any other numbness, and denies urinary incontinence or retention, bowel incontinence or retention. She denies any headache, vision change, or hearing change. No other symptoms.  The history is provided by the patient and medical records.    Past Medical History  Diagnosis Date  . Bipolar disease, chronic     hosp Jan 13 [facility] depression borderline dependent traits   . Allergic rhinitis   . Headache   . S/P total hysterectomy     hx of endometriosis  . History of being hospitalized 11-10    [facility], psych  . IBS (irritable bowel syndrome)     GI evaluation for pain IBS dx, Dr. Loreta Ave    Past Surgical History  Procedure Date  . Cholecystectomy   . Total abdominal hysterectomy   . Oophorectomy     Family History  Problem Relation Age of Onset  . Sleep apnea Father   . Migraines Sister     headaches  . Other Mother     MAC infection    History  Substance Use Topics  . Smoking status: Former Games developer  . Smokeless tobacco: Not on file  . Alcohol Use: 1.2 oz/week    2 Cans of beer per week    OB History    Grav Para Term Preterm Abortions TAB SAB Ect Mult Living                  Review of Systems  Constitutional: Negative for fever, chills, diaphoresis and appetite change.  HENT: Negative for hearing loss, ear pain, congestion,  sore throat, facial swelling, rhinorrhea, drooling, trouble swallowing, neck pain, neck stiffness, voice change, postnasal drip, tinnitus and ear discharge.   Eyes: Negative for photophobia and visual disturbance.  Respiratory: Negative for cough, chest tightness, shortness of breath and wheezing.   Cardiovascular: Negative for chest pain and palpitations.  Gastrointestinal: Negative for nausea, vomiting, abdominal pain, diarrhea, constipation and abdominal distention.  Genitourinary: Negative.   Musculoskeletal: Negative for myalgias, back pain, joint swelling, arthralgias and gait problem.    Skin: Negative for color change, pallor, rash and wound.  Neurological: Positive for numbness. Negative for dizziness, tremors, seizures, syncope, facial asymmetry, speech difficulty, weakness, light-headedness and headaches.  Hematological: Negative for adenopathy.  Psychiatric/Behavioral: Negative.     Allergies  Review of patient's allergies indicates no known allergies.  Home Medications   Current Outpatient Rx  Name Route Sig Dispense Refill  . DIPHENHYDRAMINE HCL 25 MG PO TABS Oral Take 25 mg by mouth at bedtime as needed. FOR INSOMNIA    . ESTRADIOL 1 MG PO TABS Oral Take 1 mg by mouth daily.     [name] Kitchen LITHIUM CARBONATE 600 MG PO CAPS Oral Take 600 mg by mouth at bedtime.      [name] Kitchen ONDANSETRON HCL 4 MG PO TABS Oral Take 4 mg by mouth every 8 (eight) hours as needed. FOR NAUSEA    . PROMETHAZINE HCL 25 MG PO TABS  TAKE 1 TABLET EVERY 4-6 HOURS AS NEEDED FOR NAUSEA AND VOMITING 20 tablet 0  . SUMATRIPTAN SUCCINATE 100 MG PO TABS Oral Take 100 mg by mouth every 2 (two) hours as needed.      [name] Kitchen VITAMIN D (ERGOCALCIFEROL) 50000 UNITS PO CAPS Oral Take 50,000 Units by mouth every 14 (fourteen) days. Take 1 every other week for 2 months      BP 128/72  Pulse 68  Temp(Src) 97.7 F (36.5 C) (Oral)  Resp 20  SpO2 100%  Physical Exam  Nursing note and vitals reviewed. Constitutional: She is oriented to person, place, and time. She appears well-nourished. She is active and cooperative.  Non-toxic appearance. She does not have a sickly appearance. She does not appear ill. No distress.  HENT:  Head: Normocephalic and atraumatic. Head is without raccoon's eyes, without Battle's sign and without contusion. No trismus in the jaw.  Right Ear: Hearing, tympanic membrane, external ear and ear canal normal. Tympanic membrane is not erythematous.  Left Ear: Hearing, tympanic membrane, external ear and ear canal normal. Tympanic membrane is not erythematous.  Nose: Nose normal. No mucosal edema or  rhinorrhea.  Mouth/Throat: Uvula is midline, oropharynx is clear and moist and mucous membranes are normal. No uvula swelling. No oropharyngeal exudate, posterior oropharyngeal edema, posterior oropharyngeal erythema or tonsillar abscesses.       Apparently normal oropharynx without edema, erythema, and with normal movement of the thyroid and cricothyroid structures with swallowing, no masses of the neck, no lymphadenopathy, no stridor, no dyspnea, and full range of motion of the neck without pain or tenderness.  Eyes: Conjunctivae, EOM and lids are normal. Pupils are equal, round, and reactive to light. Right eye exhibits no chemosis, no discharge and no exudate. Left eye exhibits no chemosis, no discharge and no exudate. Right conjunctiva is not injected. Left conjunctiva is not injected. Right eye exhibits normal extraocular motion and no nystagmus. Left eye exhibits normal extraocular motion and no nystagmus.  Neck: Trachea normal, normal range of motion, full passive range of motion without pain and phonation normal. Neck supple. No  JVD present. No tracheal tenderness, no spinous process tenderness and no muscular tenderness present. Carotid bruit is not present. No rigidity. No tracheal deviation, no edema, no erythema and normal range of motion present. No Brudzinski's sign noted. No mass and no thyromegaly present.  Cardiovascular: Normal rate, regular rhythm, S1 normal, S2 normal, intact distal pulses and normal pulses.  Exam reveals no gallop and no friction rub.   Pulmonary/Chest: Effort normal and breath sounds normal. No accessory muscle usage or stridor. Not tachypneic. No respiratory distress. She has no decreased breath sounds. She has no wheezes. She has no rhonchi. She has no rales.  Abdominal: Soft. Normal appearance, normal aorta and bowel sounds are normal. She exhibits no pulsatile midline mass. There is no tenderness. There is no rigidity, no rebound and no guarding.  Musculoskeletal:  Normal range of motion. She exhibits no edema and no tenderness.  Lymphadenopathy:    She has no cervical adenopathy.  Neurological: She is alert and oriented to person, place, and time. She has normal strength and normal reflexes. She is not disoriented. She displays no atrophy, no tremor and normal reflexes. A sensory deficit is present. No cranial nerve deficit. She exhibits normal muscle tone. She displays no seizure activity. Coordination and gait normal. GCS eye subscore is 4. GCS verbal subscore is 5. GCS motor subscore is 6. She displays no Babinski's sign on the right side. She displays no Babinski's sign on the left side.  Reflex Scores:      Bicep reflexes are 2+ on the right side and 2+ on the left side.      Brachioradialis reflexes are 2+ on the right side and 2+ on the left side.      Patellar reflexes are 2+ on the right side and 2+ on the left side.      Reported mild paresthesia with some preserved light touch sensation over the radial surface of the forearms bilaterally and lateral upper extremity. The patient has full-strength 5 out of 5 and symmetrical bilaterally through flexion and extension, abduction and abduction of the shoulder, hips, and full range of motion and symmetrical strength of flexion and extension of the elbows, wrists, knees, and ankles. Normal and intact visual fields to confrontation testing, normal extraocular movements. Otherwise nonfocal neurologic examination.  Skin: Skin is warm, dry and intact. She is not diaphoretic. No pallor.  Psychiatric: She has a normal mood and affect. Her speech is normal and behavior is normal. Thought content normal. Cognition and memory are normal.    ED Course  Procedures (including critical care time)   Labs Reviewed  GLUCOSE, CAPILLARY   No results found.   No diagnosis found.    MDM  After reviewing the patient's recent CT scan of her soft tissue structures of the neck which shows no abnormalities structurally,  speaking to the patient and seeing that she has no dyspnea, stridor, hoarseness of voice, and is handling her secretions well, with normal movement of the cricothyroid structures with swallowing, in no apparent neck mass, lymphadenopathy, tracheal deviation, or other findings to suggest an actual problem with swallowing or breathing or the threat of a compromise to her airway or swallowing, I cannot determine the cause of her symptoms but do not find it concerning etiologies suggested in her history or physical examination. No further testing is indicated from the emergency department standpoint, and the patient is already plugged in with outpatient workup of this problem. Additionally, other then reported paresthesia, which is bilateral, and  without any strength deficits or other neurologic findings to be concerning for stroke, this picture does not suggest an acute stroke or mass lesion and I am uncertain as to the cause of it, but do not find suggestion of an acute emergent etiology or need for further workup from the emergency department. I discussed this with the patient and instructed her to followup as instructed by her outpatient primary care physician. The patient states her understanding of and agreement with the plan of care.        Felisa Bonier, MD 11/15/11 2046  9:43 PM I was called back into the room by the patient who was concerned because she feels that she is having incomplete bladder emptying. To evaluate this, I have sent a urinalysis and we will perform a post void residual urine measure to determine if there is significant urinary retention or.  Felisa Bonier, MD 11/15/11 2144  10:10 PM The patient had almost no urine volume on post residual void, certainly less than 50 mL, much less according to the patient's nurse and the tach but performed this. She has no evidence of urinary tract infection. The patient is stable for discharge home.  Felisa Bonier, MD 11/15/11  2211

## 2011-11-15 NOTE — ED Notes (Signed)
Upon giving discharge instructions, patient requested to speak with MD about her urinary frequency. States that she is only able to void small amounts of urine and has to go frequency. Pt states she would like to have her urine tested for a urinary tract infection.

## 2011-11-15 NOTE — ED Notes (Signed)
CBG CHECKED BY EMT R Christalyn Goertz 89

## 2011-11-15 NOTE — ED Notes (Signed)
Intermittent bilateral cheek numbness, when pt. Lays down she feels like she is choking. She also has experienced, intermittent arm and leg numbness, she is having numbness presently.

## 2011-11-15 NOTE — Discharge Instructions (Signed)
Dysphagia Swallowing problems (dysphagia) occur when solids and liquids seem to stick in your throat on the way down to your stomach, or the food takes longer to get to the stomach. Other symptoms (problems) include regurgitating (burping) up food, noises coming from the throat, chest discomfort with swallowing, and a feeling of fullness in the throat when swallowing. When blockage in the throat is complete it may be associated with drooling. CAUSES There are many causes of swallowing difficulties and the following is generalized information regarding a number of reasons for this problem. Problems with swallowing may occur because of problems with the muscles. The food cannot be propelled in the usual manner into the stomach. There may be ulcers, scar tissueor inflammation (soreness) in the esophagus (the food tube from the mouth to the stomach) which blocks food from passing normally into the stomach. Causes of inflammation include acid reflux from the stomach into the esophagus. Inflammation can also be caused by the herpes simplex virus, Candida (yeast), radiation (as with treatment of cancer), or inflammation from medications not taken with adequate fluids to wash them down into the stomach. There may be nerve problems so signals cannot be sent adequately telling the muscles of the esophagus to contract and move the food along. Achalasia is a rare disorder of the esophagus in which muscular contractions of the esophagus are uncoordinated. Globus hystericus is a relatively common problem in young females in which there is a sense of an obstruction or difficulty in swallowing, but in which no abnormalities can be found. This problem usually improves over time with reassurance and testing to rule out other causes. DIAGNOSIS A number of tests will help your caregiver know what is the cause of your swallowing problems. These tests may include a barium swallow in which x-rays are taken while you are drinking a  liquid that outlines the lining of the esophagus on x-ray. If the stomach and small bowel are also studied in this manner it is called an upper gastrointestinal exam (UGI). Endoscopy may be done in which your caregiver examines your throat, esophagus, stomach and small bowel with an instrument like a small flexible telescope. Motility studies which measure the effectiveness and coordination of the muscular contractions of the esophagus may also be done. TREATMENT The treatment of swallowing problems are many, varying from medications to surgical treatment. The treatment varies with the type of problem found. Your caregiver will discuss your results and treatment with you. If swallowing problems are severe the long term problems which may occur include: malnutrition, pneumonia (from food going into the breathing tubes called trachea and bronchi), and an increase in tumors (lumps) of the esophagus. SEEK IMMEDIATE MEDICAL CARE IF:  Food or other object becomes lodged in your throat or esophagus and won't move.  Document Released: 08/01/2000 Document Revised: 07/24/2011 Document Reviewed: 03/22/2008 [facility] Patient Information 2012 [place], [place].Paresthesia Paresthesia is an abnormal burning or prickling sensation. This sensation is generally felt in the hands, arms, legs, or feet. However, it may occur in any part of the body. It is usually not painful. The feeling may be described as:  Tingling or numbness.   "Pins and needles."   Skin crawling.   Buzzing.   Limbs "falling asleep."   Itching.  Most people experience temporary (transient) paresthesia at some time in their lives. CAUSES  Paresthesia may occur when you breathe too quickly (hyperventilation). It can also occur without any apparent cause. Commonly, paresthesia occurs when pressure is placed on a nerve. The  feeling quickly goes away once the pressure is removed. For some people, however, paresthesia is a long-lasting (chronic)  condition caused by an underlying disorder. The underlying disorder may be:  A traumatic, direct injury to nerves. Examples include a:   Broken (fractured) neck.   Fractured skull.   A disorder affecting the brain and spinal cord (central nervous system). Examples include:   Transverse myelitis.   Encephalitis.   Transient ischemic attack.   Multiple sclerosis.   Stroke.   Tumor or blood vessel problems, such as an arteriovenous malformation pressing against the brain or spinal cord.   A condition that damages the peripheral nerves (peripheral neuropathy). Peripheral nerves are not part of the brain and spinal cord. These conditions include:   Diabetes.   Peripheral vascular disease.   Nerve entrapment syndromes, such as carpal tunnel syndrome.   Shingles.   Hypothyroidism.   Vitamin B12 deficiencies.   Alcoholism.   Heavy metal poisoning (lead, arsenic).   Rheumatoid arthritis.   Systemic lupus erythematosus.  DIAGNOSIS  Your caregiver will attempt to find the underlying cause of your paresthesia. Your caregiver may:  Take your medical history.   Perform a physical exam.   Order various lab tests.   Order imaging tests.  TREATMENT  Treatment for paresthesia depends on the underlying cause. HOME CARE INSTRUCTIONS  Avoid drinking alcohol.   You may consider massage or acupuncture to help relieve your symptoms.   Keep all follow-up appointments as directed by your caregiver.  SEEK IMMEDIATE MEDICAL CARE IF:   You feel weak.   You have trouble walking or moving.   You have problems with speech or vision.   You feel confused.   You cannot control your bladder or bowel movements.   You feel numbness after an injury.   You faint.   Your burning or prickling feeling gets worse when walking.   You have pain, cramps, or dizziness.   You develop a rash.  MAKE SURE YOU:  Understand these instructions.   Will watch your condition.   Will  get help right away if you are not doing well or get worse.  Document Released: 07/25/2002 Document Revised: 07/24/2011 Document Reviewed: 04/25/2011 [facility] Patient Information 2012 [place], [place].

## 2011-11-15 NOTE — ED Notes (Signed)
MEASURED URINE AFTER [id number]

## 2011-11-16 NOTE — Progress Notes (Signed)
Quick Note:  Tell patient that x ray shows no acute abnormality. Good airway and normal thyroid . ______

## 2011-11-17 ENCOUNTER — Telehealth: Payer: Self-pay | Admitting: *Deleted

## 2011-11-17 NOTE — Progress Notes (Signed)
Quick Note:  Left a message for pt to return call. ______ 

## 2011-11-17 NOTE — Telephone Encounter (Signed)
Noted  Should keep neuro appt.

## 2011-11-17 NOTE — Progress Notes (Signed)
Quick Note:  Pt aware ______ 

## 2011-11-17 NOTE — Telephone Encounter (Signed)
Returning call for lab results.  Also wants pcp to know that she continues to experience a feeling of throat closing.  Has been taking Benadryl for the past month and went to the ED on Saturday.  Questions if she is having a bad reaction to Benadryl because it seems to be drying her out, can't urinate much and is having swelling around urethra.  She has since stopped taking the benadryl and wants to know if she needs to come in for a follow appt.  Pt also states she has an upcoming appt with neurologist in [place] on 11/26/11.

## 2011-12-03 ENCOUNTER — Telehealth: Payer: Self-pay | Admitting: Internal Medicine

## 2011-12-03 NOTE — Telephone Encounter (Signed)
Pt called and is req to get allergy shots or does pt need to go to an allergist?

## 2011-12-03 NOTE — Telephone Encounter (Signed)
Pls advise.  

## 2011-12-03 NOTE — Telephone Encounter (Signed)
If she thinks she needs allergy shots she should get appt with allergist

## 2011-12-10 NOTE — Telephone Encounter (Signed)
Pt aware.

## 2011-12-23 ENCOUNTER — Other Ambulatory Visit: Payer: Self-pay | Admitting: Internal Medicine

## 2011-12-26 NOTE — Telephone Encounter (Signed)
Would be best to have her gynecologist prescribed this as I have not been prescribing this medicine  and because of her history of endometriosis however if she is going to run out we can refill the medication.   Please contact patient about this and update her care teams.

## 2012-02-13 ENCOUNTER — Telehealth: Payer: Self-pay | Admitting: Internal Medicine

## 2012-02-13 ENCOUNTER — Ambulatory Visit (INDEPENDENT_AMBULATORY_CARE_PROVIDER_SITE_OTHER): Payer: Self-pay | Admitting: Internal Medicine

## 2012-02-13 ENCOUNTER — Encounter: Payer: Self-pay | Admitting: Internal Medicine

## 2012-02-13 VITALS — BP 110/66 | HR 78 | Temp 98.6°F | Wt 138.0 lb

## 2012-02-13 DIAGNOSIS — H9209 Otalgia, unspecified ear: Secondary | ICD-10-CM | POA: Insufficient documentation

## 2012-02-13 DIAGNOSIS — R6884 Jaw pain: Secondary | ICD-10-CM | POA: Insufficient documentation

## 2012-02-13 DIAGNOSIS — H6090 Unspecified otitis externa, unspecified ear: Secondary | ICD-10-CM | POA: Insufficient documentation

## 2012-02-13 DIAGNOSIS — H60399 Other infective otitis externa, unspecified ear: Secondary | ICD-10-CM

## 2012-02-13 HISTORY — DX: Jaw pain: R68.84

## 2012-02-13 MED ORDER — OFLOXACIN 0.3 % OT SOLN: 10.0000 [drp] | Freq: Every day | OTIC | Status: AC

## 2012-02-13 NOTE — Progress Notes (Signed)
  Subjective:    Patient ID: [name], female    DOB: 04/11/1961, 51 y.o.   MRN: [id number]  HPI Patient comes in today for SDA for  new problem evaluation. 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 New insurance begins next week. She has had one week history of ongoing increasing right ear pain she did use peroxide and alcohol yesterday but didn't help it. This still achy but there is no drainage. No fever or cold symptoms she does have some pain in the ear area down the jaw worse with chewing or biting or opening her mouth. No real sore throat. She is now swimming almost every day and enjoys it to get in shape. Review of Systems Negative fever or sore throat of feels achy and a little bit nauseous. Meds list reviewed  Past history family history social history reviewed in the electronic medical record.     Objective:   Physical Exam BP 110/66  Pulse 78  Temp 98.6 F (37 C) (Oral)  Wt 138 lb (62.596 kg)  SpO2 97% WDWN in nad [place] eyes clear op clear  Ears/ Left nl with tm and each  Right non tendern but has redness of canal and no wax  Tm intact ..tender at angle of jarw and at tmj area .   Neck: Supple without adenopathy or masses or bruits    Assessment & Plan:  Right ear pain  external otitis right  But also has jaw pain with chewing possible TMJ dental involvement. Patient asked about oral antibiotic I do not think is indicated at this time we'll use antibiotic drops avoidance and prevention. Can use Advil Aleve with care if not improving next week call for next step.

## 2012-02-13 NOTE — Patient Instructions (Signed)
Exam is consistent with a mild swimmer's ear.  Use the drops every day for a week; avoid water in the ear.  Some of your pain acts like TMJ or dental pain. Careful ibuprofen or Aleve and an ice pack was also helpful. I expect it to be a lot better next week.   Otitis Externa Otitis externa ("swimmer's ear") is a germ (bacterial) or fungal infection of the outer ear canal (from the eardrum to the outside of the ear). Swimming in dirty water may cause swimmer's ear. It also may be caused by moisture in the ear from water remaining after swimming or bathing. Often the first signs of infection may be itching in the ear canal. This may progress to ear canal swelling, redness, and pus drainage, which may be signs of infection. HOME CARE INSTRUCTIONS   Apply the antibiotic drops to the ear canal as prescribed by your doctor.   This can be a very painful medical condition. A strong pain reliever may be prescribed.   Only take over-the-counter or prescription medicines for pain, discomfort, or fever as directed by your caregiver.   If your caregiver has given you a follow-up appointment, it is very important to keep that appointment. Not keeping the appointment could result in a chronic or permanent injury, pain, hearing loss and disability. If there is any problem keeping the appointment, you must call back to this facility for assistance.  PREVENTION   It is important to keep your ear dry. Use the corner of a towel to wick water out of the ear canal after swimming or bathing.   Avoid scratching in your ear. This can damage the ear canal or remove the protective wax lining the canal and make it easier for germs (bacteria) or a fungus to grow.   You may use ear drops made of rubbing alcohol and vinegar after swimming to prevent future "swimmer's ear" infections. Make up a small bottle of equal parts white vinegar and alcohol. Put 3 or 4 drops into each ear after swimming.   Avoid swimming in lakes,  polluted water, or poorly chlorinated pools.  SEEK MEDICAL CARE IF:   An oral temperature above 102 F (38.9 C) develops.   Your ear is still painful after 3 days and shows signs of getting worse (redness, swelling, pain, or pus).  MAKE SURE YOU:   Understand these instructions.   Will watch your condition.   Will get help right away if you are not doing well or get worse.  Document Released: 08/04/2005 Document Revised: 07/24/2011 Document Reviewed: 03/10/2008 [facility] Patient Information 2012 [place], [place].

## 2012-02-13 NOTE — Telephone Encounter (Signed)
Caller: [name]; PCP: [name].; CB#: [phone]; Call regarding Rt Ear Pain; sx started 02/10/12; pt has been swimming a lot; pain is located on the inside and outside of the ear; ear feels swollen; having facial pain from the ear pain; no facial swelling; no fever; Triaged per Ear: Symptoms Guideline; See in 4 hr d/t tenderness to the outer portion ofthe ear; pt states that she already has an appt for today at 1:30pm; will comply

## 2012-02-16 ENCOUNTER — Encounter: Payer: Self-pay | Admitting: Internal Medicine

## 2012-02-16 ENCOUNTER — Telehealth: Payer: Self-pay | Admitting: Internal Medicine

## 2012-02-16 ENCOUNTER — Ambulatory Visit (INDEPENDENT_AMBULATORY_CARE_PROVIDER_SITE_OTHER): Payer: Commercial Managed Care - PPO | Admitting: Internal Medicine

## 2012-02-16 VITALS — BP 102/68 | HR 105 | Temp 98.7°F | Wt 138.0 lb

## 2012-02-16 DIAGNOSIS — H6090 Unspecified otitis externa, unspecified ear: Secondary | ICD-10-CM

## 2012-02-16 DIAGNOSIS — H9201 Otalgia, right ear: Secondary | ICD-10-CM

## 2012-02-16 DIAGNOSIS — H9209 Otalgia, unspecified ear: Secondary | ICD-10-CM

## 2012-02-16 DIAGNOSIS — H60399 Other infective otitis externa, unspecified ear: Secondary | ICD-10-CM

## 2012-02-16 HISTORY — DX: Otalgia, right ear: H92.01

## 2012-02-16 MED ORDER — AMOXICILLIN-POT CLAVULANATE 875-125 MG PO TABS: 1.0000 | ORAL_TABLET | Freq: Two times a day (BID) | ORAL | Status: AC

## 2012-02-16 NOTE — Telephone Encounter (Signed)
Caller: Mylinda Latina; PCP: [name].; CB#: [phone]; ; ; Call regarding R Ear Pain not Improving after ABX Ear Drops; Pt was seen on 6-28.  Pt feels like pain is radiating to L Ear now, 7-1.  Pt requesting oral ABX be called in.  Pt feels warm, no Temp reported.  Pt has appt already on 7-1 prior to triage. Advised Pt to keep appt and f/u w/ MD.  Pt verbalized understanding.

## 2012-02-16 NOTE — Progress Notes (Signed)
  Subjective:    Patient ID: [name], female    DOB: 1961/02/27, 51 y.o.   MRN: [id number]  HPI Patient comes in today for SDA for problem evaluation.that is not better.  Was seen before weekend and put on ear drops for right ear pain and redness  Presumed OE vs TMJ however now better and Doesn't feel well.      No throat pain  . If takes a deep breath feels it   And CAN said she could have pna .  Has chills not documented fever  Right ear  Feels swollen in to   Jaw area and feels tingling around this area.  Popping  In ear left  nose  Is not congested and thinks needs antibiotic. No drainage.   No face pain no salivary pain.  Radiating   Pain makes it Cause hard to chew .   And open mouth no changes. No rash no other rx but thear drops worded this weekend. Review of Systems No vision change adenopathy  no rash and no hx of same has mid lower rib cage discomfort when takes a deep breath no sob.  No uti sx.  Past history family history social history reviewed in the electronic medical record.     Objective:   Physical Exam BP 102/68  Pulse 105  Temp 98.7 F (37.1 C) (Oral)  Wt 138 lb (62.596 kg)  SpO2 97% WDWN in nad    AT tms clear bilaterally with nl lm right eac not swollen but still some red  No vesicle seen whit exudate minimal ? Drops.  Neg pinna pull minimal tragal tenderness and no redness but full on right pre auricular and angle of jaw  Left tm and eac nl small wax amt. Op no lesion neg tooth tap and no enanthem. Can open mouth better than last week.  Chest:  Clear to A without wheezes rales or rhonchi CV:  S1-S2 no gallops or murmurs peripheral perfusion is normal Neuro non focal      Assessment & Plan:   Right ear pain  Pt thinks she has om but dont see this on exam ; severity of pain and discomfort tmore than exam is concerning    Early shingle or progressing infection OE  Vs other ? Viral . Left ear looks good although pt says moving to that ear.  Empiric rx  with antibiotic  Close observation and call if rash etc.  Call 48 hours pre holiday  Monitor and document if has fever.  No signs of pna.

## 2012-02-16 NOTE — Patient Instructions (Addendum)
Ear drum looks ok    Can add oral antibiotic   For poss progressing bacterial infection Other  possibilities  could be early shingles  If get a rash call us.  Or not better in 48 hours .   Or  Inflammation of the parotid gland.   Expect improvement in the next 48 hours.

## 2012-02-23 ENCOUNTER — Telehealth: Payer: Self-pay | Admitting: Internal Medicine

## 2012-02-23 NOTE — Telephone Encounter (Signed)
Caller: [name]/Patient; PCP: [name].; CB#: [phone]; ; ; Call regarding Abdominal Crampiing, Nausea, HA, onset 7-5.  Pt is taking Augmentin for Ear Infx, Pt finished on 7-6.  Pt spoke w/ Pharmacist on 7-8, Pharmacist advised Pt sxs could be related to Augmentin.  Triage offered, Pt refused.  Advised Pt to go to ED if sxs worsen.  Pt will follow up with office on 7-9.

## 2012-02-24 ENCOUNTER — Other Ambulatory Visit: Payer: Self-pay | Admitting: Internal Medicine

## 2012-05-17 ENCOUNTER — Encounter: Payer: Self-pay | Admitting: Internal Medicine

## 2012-05-17 ENCOUNTER — Ambulatory Visit (INDEPENDENT_AMBULATORY_CARE_PROVIDER_SITE_OTHER): Payer: Self-pay | Admitting: Internal Medicine

## 2012-05-17 ENCOUNTER — Other Ambulatory Visit: Payer: Self-pay | Admitting: Internal Medicine

## 2012-05-17 ENCOUNTER — Telehealth: Payer: Self-pay | Admitting: Internal Medicine

## 2012-05-17 VITALS — BP 124/82 | Temp 97.9°F | Wt 138.0 lb

## 2012-05-17 DIAGNOSIS — B9789 Other viral agents as the cause of diseases classified elsewhere: Secondary | ICD-10-CM

## 2012-05-17 DIAGNOSIS — M545 Low back pain, unspecified: Secondary | ICD-10-CM | POA: Insufficient documentation

## 2012-05-17 DIAGNOSIS — K5900 Constipation, unspecified: Secondary | ICD-10-CM | POA: Insufficient documentation

## 2012-05-17 DIAGNOSIS — B349 Viral infection, unspecified: Secondary | ICD-10-CM

## 2012-05-17 HISTORY — DX: Constipation, unspecified: K59.00

## 2012-05-17 LAB — CBC WITH DIFFERENTIAL/PLATELET
Basophils Relative: 0.4 % (ref 0.0–3.0)
Eosinophils Absolute: 0.2 10*3/uL (ref 0.0–0.7)
Eosinophils Relative: 3.7 % (ref 0.0–5.0)
HCT: 42.4 % (ref 36.0–46.0)
Lymphs Abs: 1.4 10*3/uL (ref 0.7–4.0)
MCHC: 32.9 g/dL (ref 30.0–36.0)
MCV: 93.2 fl (ref 78.0–100.0)
Monocytes Absolute: 0.6 10*3/uL (ref 0.1–1.0)
RBC: 4.55 Mil/uL (ref 3.87–5.11)
WBC: 6.8 10*3/uL (ref 4.5–10.5)

## 2012-05-17 LAB — BASIC METABOLIC PANEL
BUN: 14 mg/dL (ref 6–23)
CO2: 24 mEq/L (ref 19–32)
Chloride: 105 mEq/L (ref 96–112)
Potassium: 4 mEq/L (ref 3.5–5.1)

## 2012-05-17 LAB — POCT URINALYSIS DIPSTICK
Blood, UA: NEGATIVE
Nitrite, UA: NEGATIVE
Protein, UA: NEGATIVE
Urobilinogen, UA: 0.2
pH, UA: 6

## 2012-05-17 LAB — HEPATIC FUNCTION PANEL
ALT: 17 U/L (ref 0–35)
Albumin: 4.1 g/dL (ref 3.5–5.2)
Total Protein: 7.3 g/dL (ref 6.0–8.3)

## 2012-05-17 NOTE — Assessment & Plan Note (Addendum)
She complains of constipation. Her rectal exam is normal. Patient would like to try MiraLax and defers stronger laxatives for now. Patient will try magnesium citrate over-the-counter if no relief with MiraLax.  She has hx of IBS.  She declines rx for levsin.  Consider amitiza if chronic constipation.

## 2012-05-17 NOTE — Assessment & Plan Note (Signed)
Patient has right-sided low back pain. She likely has lumbar strain. Her UA was negative for UTI.

## 2012-05-17 NOTE — Telephone Encounter (Signed)
This call was mistakenly attached to a refill request:  [name] 05/17/2012 2:27 PM Signed  Caller: [name]/Patient; Patient Name: [name]; PCP: Berniece Andreas (Family Practice); Best Callback Phone Number: [phone]. Called re body aches, nausea, and fatigue. Feels full all the time. Onset: 05/12/12. Urinary frequency. Suspects fever; has not taken temperature due to Advil dose at 1200. Hysterectomy. Constipated; last BM 05/17/12- hard stool. No vomiting. Over weekend had migraine headache, low back pain and abdominal cramping. History of multiple abdominal surgeries. Reports urine was brown in color 05/16/12. Advised to see MD within 4 hours for generalized muscle pain and dark urine per Muscle Pain guideline. Appointment scheduled for 1630 05/17/12 with Dr. Artist Pais.

## 2012-05-17 NOTE — Telephone Encounter (Signed)
Caller: [name]/Patient; Patient Name: [name]; PCP: Berniece Andreas [facility]); Best Callback Phone Number: [phone].  Called re body aches, nausea, and fatigue.  Feels full all the time.  Onset: 05/12/12.  Urinary frequency.  Suspects fever; has not taken temperature due to Advil dose at 1200. Hysterectomy.  Constipated; last BM 05/17/12- hard stool.  No vomiting.  Over weekend had migraine headache, low back pain and abdominal cramping. History of multiple abdominal surgeries. Reports urine was brown in color 05/16/12.  Advised to see MD within 4 hours for generalized muscle pain and dark urine per Muscle Pain guideline.  Appointment scheduled for 1630 05/17/12 with Dr. Artist Pais.

## 2012-05-17 NOTE — Assessment & Plan Note (Signed)
51 year old white female complains of body aches, nausea and abdominal bloating for one week. I suspect her symptoms may be secondary to viral gastroenteritis. She is not orthostatic on exam. Her blood pressure is 120/80 sitting and standing. No significant change in her pulse.  Obtain CBC, basic metabolic panel and LFTs.  Patient advised increase fluids.  Patient advised to decrease Zofran use and use Phenergan for nausea as Zofran may be exacerbating her constipation.  Patient advised to call office if symptoms persist or worsen.  Follow up with PCP within 2 weeks.

## 2012-05-17 NOTE — Telephone Encounter (Signed)
Caller: Oscar/Patient; Patient Name: [name], [name]; PCP: Panosh, Wanda (Family Practice); Best Callback Phone Number: [phone].  Called re body aches, nausea, and fatigue.  Feels full all the time.  Onset: 05/12/12.  Urinary frequency.  Suspects fever; has not taken temperature due to Advil dose at 1200. Hysterectomy.  Constipated; last BM 05/17/12- hard stool.  No vomiting.  Over weekend had migraine headache, low back pain and abdominal cramping. History of multiple abdominal surgeries. Reports urine was brown in color 05/16/12.  Advised to see MD within 4 hours for generalized muscle pain and dark urine per Muscle Pain guideline.  Appointment scheduled for 1630 05/17/12 with Dr. Yoo.  °

## 2012-05-17 NOTE — Progress Notes (Signed)
Subjective:    Patient ID: [name], female    DOB: [birth year], 51 y.o.   MRN: [id number]  HPI  51 year old white female with history of bipolar disorder complains of generalized fatigue and achiness over the last 5 days. Patient complains of constipation. She usually goes every other day but her last bowel movement was yesterday where she passed small pellet like stools. It was 5 days in between her last bowel movement. She has been increasing her fluid intake. She has abdominal cramping and nausea. No vomiting.  She feels like her abdomen is bloated. She has been using Zofran 4 nausea.  She also feels that she's going to pass out.  She denies shortness of breath or chest pain.  She has right-sided low back pain. She was concerned she might have a urinary tract infection. However she is not having any urinary symptoms.  Review of Systems Negative for fever, negative for syncope  Past Medical History  Diagnosis Date  . Bipolar disease, chronic     hosp Jan 13 [facility] depression borderline dependent traits   . Allergic rhinitis   . Headache   . S/P total hysterectomy     hx of endometriosis  . History of being hospitalized 11-10    [facility], psych  . IBS (irritable bowel syndrome)     GI evaluation for pain IBS dx, Dr. Loreta Ave    History   Social History  . Marital Status: Single    Spouse Name: N/A    Number of Children: N/A  . Years of Education: N/A   Occupational History  . Not on file.   Social History Main Topics  . Smoking status: Former Games developer  . Smokeless tobacco: Not on file  . Alcohol Use: 1.2 oz/week    2 Cans of beer per week  . Drug Use: No  . Sexually Active: Not on file   Other Topics Concern  . 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 Cholecystectomy    . Total abdominal hysterectomy   . Oophorectomy     Family History  Problem Relation Age of Onset  . Sleep apnea Father   . Migraines Sister     headaches  . Other Mother     MAC infection    No Known Allergies  Current Outpatient Prescriptions on File Prior to Visit  Medication Sig Dispense Refill  . diphenhydrAMINE (BENADRYL) 25 MG tablet Take 25 mg by mouth at bedtime as needed. FOR INSOMNIA      . estradiol (ESTRACE) 1 MG tablet TAKE 1 TABLET BY MOUTH EVERY DAY  30 tablet  1  . lamoTRIgine (LAMICTAL) 100 MG tablet Take 100 mg by mouth daily. Take 1/2 tab daily      . lithium 600 MG capsule Take 600 mg by mouth at bedtime.        [name] Kitchen LORazepam (ATIVAN) 0.5 MG tablet Take 0.5 mg by mouth at bedtime.      . ondansetron (ZOFRAN) 4 MG tablet Take 4 mg by mouth every 8 (eight) hours as needed. FOR NAUSEA      . promethazine (PHENERGAN) 25 MG tablet TAKE 1 TABLET EVERY 4-6 HOURS AS NEEDED FOR NAUSEA AND VOMITING  20 tablet  0  . SUMAtriptan (IMITREX) 100 MG tablet Take 100 mg  by mouth every 2 (two) hours as needed.          BP 124/82  Temp 97.9 F (36.6 C) (Oral)  Wt 138 lb (62.596 kg)       Objective:   Physical Exam  Constitutional: She is oriented to person, place, and time. She appears well-developed and well-nourished.  HENT:  Head: Normocephalic and atraumatic.  Right Ear: External ear normal.  Left Ear: External ear normal.  Mouth/Throat: Oropharynx is clear and moist.  Eyes: Conjunctivae normal and EOM are normal. Pupils are equal, round, and reactive to light. No scleral icterus.  Neck: Neck supple.  Cardiovascular: Normal rate and regular rhythm.   No murmur heard. Pulmonary/Chest: Effort normal and breath sounds normal. She has no wheezes.  Abdominal: Soft. Bowel sounds are normal. There is no rebound and no guarding.       Mild diffuse tenderness  Genitourinary:       Rectal exam:  Small external hemorrhoids. No stool in rectal vault.  No rectal mass    Lymphadenopathy:    She has no cervical adenopathy.  Neurological: She is alert and oriented to person, place, and time. No cranial nerve deficit.  Psychiatric: She has a normal mood and affect. Her behavior is normal.          Assessment & Plan:

## 2012-05-17 NOTE — Patient Instructions (Addendum)
Drink plenty of fluids.  Use phenergan for nausea instead of zofran. Please call our office if your symptoms do not improve or gets worse. Follow up with Dr. Fabian Sharp within one week

## 2012-05-18 LAB — TSH: TSH: 0.95 u[IU]/mL (ref 0.35–5.50)

## 2012-05-18 LAB — T4, FREE: Free T4: 0.62 ng/dL (ref 0.60–1.60)

## 2012-06-30 ENCOUNTER — Encounter: Payer: Self-pay | Admitting: Family

## 2012-06-30 ENCOUNTER — Ambulatory Visit (INDEPENDENT_AMBULATORY_CARE_PROVIDER_SITE_OTHER): Payer: Managed Care, Other (non HMO) | Admitting: Family

## 2012-06-30 ENCOUNTER — Encounter: Payer: Self-pay | Admitting: Internal Medicine

## 2012-06-30 ENCOUNTER — Telehealth: Payer: Self-pay | Admitting: Internal Medicine

## 2012-06-30 VITALS — BP 100/60 | HR 79 | Temp 98.4°F | Wt 136.0 lb

## 2012-06-30 DIAGNOSIS — R5381 Other malaise: Secondary | ICD-10-CM

## 2012-06-30 DIAGNOSIS — R229 Localized swelling, mass and lump, unspecified: Secondary | ICD-10-CM

## 2012-06-30 DIAGNOSIS — R222 Localized swelling, mass and lump, trunk: Secondary | ICD-10-CM

## 2012-06-30 DIAGNOSIS — E559 Vitamin D deficiency, unspecified: Secondary | ICD-10-CM

## 2012-06-30 DIAGNOSIS — D510 Vitamin B12 deficiency anemia due to intrinsic factor deficiency: Secondary | ICD-10-CM

## 2012-06-30 DIAGNOSIS — R5383 Other fatigue: Secondary | ICD-10-CM

## 2012-06-30 LAB — HEPATIC FUNCTION PANEL
ALT: 12 U/L (ref 0–35)
AST: 16 U/L (ref 0–37)
Albumin: 3.9 g/dL (ref 3.5–5.2)
Total Protein: 7.1 g/dL (ref 6.0–8.3)

## 2012-06-30 LAB — CBC WITH DIFFERENTIAL/PLATELET
Basophils Relative: 0.6 % (ref 0.0–3.0)
Eosinophils Absolute: 0.2 10*3/uL (ref 0.0–0.7)
MCHC: 33.1 g/dL (ref 30.0–36.0)
MCV: 93 fl (ref 78.0–100.0)
Monocytes Absolute: 0.6 10*3/uL (ref 0.1–1.0)
Neutrophils Relative %: 62.6 % (ref 43.0–77.0)
Platelets: 313 10*3/uL (ref 150.0–400.0)
RBC: 4.44 Mil/uL (ref 3.87–5.11)
RDW: 12.4 % (ref 11.5–14.6)

## 2012-06-30 NOTE — Telephone Encounter (Signed)
Caller: Tearra/Patient; Patient Name: [name]; PCP: Berniece Andreas [facility]); Best Callback Phone Number: [phone].  Patient calling about fatigue, numbness in arms and legs, and chest pain 06/28/12.  States she also feels full, without appetite, for which she sees GI specialist.  States she also has a lump on right lower back; has discussed with Dr. Fabian Sharp, but has not seen surgeon about it; no changes in the lump.  States she feels numb when she awakens in the AM.  No further episodes of chest pain.  Episode 06/28/12 which lasted ~ 10-15 minutes, and resolved.  Described as sharp pain, which resolved on its own; no episodes since. Per chest pain and fatigue protocols, emergent symptoms currently denied; advised appt within 24 hours.  Patient states she did get appt 1515 06/30/12 with Ms. Orvan Falconer; to keep that appt.

## 2012-06-30 NOTE — Patient Instructions (Addendum)

## 2012-07-01 LAB — VITAMIN D 25 HYDROXY (VIT D DEFICIENCY, FRACTURES): Vit D, 25-Hydroxy: 28 ng/mL — ABNORMAL LOW (ref 30–89)

## 2012-07-01 NOTE — Progress Notes (Signed)
Subjective:    Patient ID: [name], female    DOB: 02-23-1961, 51 y.o.   MRN: [id number]  HPI 51 year old white female, nonsmoker, patient of Dr. Fabian Sharp is in today with complaints of fatigue x1 year that comes and goes. Also reports numbness in her hands and legs but also comes and goes. She's here today because of increasing fatigue. She's had multiple labs drawn in the past with no known cause and is seen. Specialist. She has a history of bipolar disorder currently takes lithium. Patient most recently saw gastroenterology and was treated with antibiotics for a GI infection. She denies any sneezing, coughing, congestion, shortness of breath, lightheadedness, dizziness, chest pain, palpitations or edema.  She also sees psych and a Veterinary surgeon.   Patient is requesting to be referred back to surgeon for a mass on the right lower back that was originally referred on 05/08/2011.   Review of Systems  Constitutional: Negative.   HENT: Negative.   Respiratory: Negative.   Cardiovascular: Negative.   Gastrointestinal: Negative.   Genitourinary: Negative.   Musculoskeletal: Negative.   Neurological: Negative.   Hematological: Negative.   Psychiatric/Behavioral: Negative.    Past Medical History  Diagnosis Date  . Bipolar disease, chronic     hosp Jan 13 [facility] depression borderline dependent traits   . Allergic rhinitis   . Headache   . S/P total hysterectomy     hx of endometriosis  . History of being hospitalized 11-10    [facility], psych  . IBS (irritable bowel syndrome)     GI evaluation for pain IBS dx, Dr. Loreta Ave    History   Social History  . Marital Status: Single    Spouse Name: N/A    Number of Children: N/A  . Years of Education: N/A   Occupational History  . Not on file.   Social History Main Topics  . Smoking status: Former Games developer  . Smokeless tobacco: Not on file  . Alcohol Use: 1.2 oz/week    2 Cans of beer per week  . Drug Use: No  . Sexually Active: Not  on file   Other Topics Concern  . 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 Cholecystectomy   . Total abdominal hysterectomy   . Oophorectomy     Family History  Problem Relation Age of Onset  . Sleep apnea Father   . Migraines Sister     headaches  . Other Mother     MAC infection    No Known Allergies  Current Outpatient Prescriptions on File Prior to Visit  Medication Sig Dispense Refill  . diphenhydrAMINE (BENADRYL) 25 MG tablet Take 25 mg by mouth at bedtime as needed. FOR INSOMNIA      . estradiol (ESTRACE) 1 MG tablet TAKE 1 TABLET BY MOUTH EVERY DAY  30 tablet  2  . lamoTRIgine (LAMICTAL) 100 MG tablet Take 100 mg by mouth daily. Take 1/2 tab daily      . lithium 600 MG capsule Take 600 mg by mouth at bedtime.        [name] Kitchen LORazepam (ATIVAN) 0.5 MG tablet Take 0.5 mg by mouth at bedtime.      . ondansetron (ZOFRAN) 4 MG tablet Take 4 mg by mouth every 8 (eight) hours as needed. FOR NAUSEA      .  promethazine (PHENERGAN) 25 MG tablet TAKE 1 TABLET EVERY 4-6 HOURS AS NEEDED FOR NAUSEA AND VOMITING  20 tablet  0  . SUMAtriptan (IMITREX) 100 MG tablet Take 100 mg by mouth every 2 (two) hours as needed.          BP 100/60  Pulse 79  Temp 98.4 F (36.9 C) (Oral)  Wt 136 lb (61.689 kg)  SpO2 98%chart    Objective:   Physical Exam  Constitutional: She is oriented to person, place, and time. She appears well-developed and well-nourished.  HENT:  Right Ear: External ear normal.  Left Ear: External ear normal.  Nose: Nose normal.  Mouth/Throat: Oropharynx is clear and moist.  Neck: Normal range of motion. Neck supple.  Cardiovascular: Normal rate, regular rhythm and normal heart sounds.   Pulmonary/Chest: Effort normal and breath sounds normal.  Abdominal: Soft. Bowel  sounds are normal.  Musculoskeletal: Normal range of motion.  Neurological: She is alert and oriented to person, place, and time.  Skin: Skin is warm and dry.  Psychiatric: She has a normal mood and affect.          Assessment & Plan:  Assessment: Fatigue, mass right lower back, anxiety  Plan: Lab sent to include lithium level, vitamin D, B12 notify patient pending results. I believe the patient's symptoms are psychologic in nature. Who will follow with her pending the results of her labs, as scheduled, and sooner when necessary.

## 2012-07-05 ENCOUNTER — Telehealth: Payer: Self-pay | Admitting: Family Medicine

## 2012-07-05 NOTE — Telephone Encounter (Signed)
[facility] Triage Call Report Triage Record Num: [id number] Operator: Migdalia Dk Patient Name: [name] Call Date & Time: 07/05/2012 6:08:09AM Patient Phone: [phone] PCP: Neta Mends. Panosh Patient Gender: Female PCP Fax : [phone] Patient DOB: [birth year] [facility] Name[facility] Reason for Call: Caller: Mylinda Latina; PCP: Berniece Andreas (Family Practice); CB#: [phone]; Call regarding Back Pain; Started with lower back pain around 07/01/2012. Pain continues now and radiates to front of abdomen. States seen in ED on 07/02/2012 had CT scan and found to have cysts on both kidneys. States has large swollen areas on back, "I dont know what those are." This a.m. with complaints of being numb from waist down, nausea, lower back pain radiating to front of abdomen, weakness in legs and extreme weakness of entire body. Advised patient to go to ED for evaluation. Refuses and states they will do nothing for her and wants this information sent to Dr. Fabian Sharp and to let Dr. Fabian Sharp know she would like to see a surgeon. Advised patient information will be faxed to office. Protocol(s) Used: Back Symptoms Recommended Outcome per Protocol: See ED Immediately Reason for Outcome: Numbness in the groin and saddle area of pelvis AND lower extremity weakness OR change in bowel or bladder control (loss of control, retention, overflow) Care Advice: ~ 11/18/

## 2012-07-07 ENCOUNTER — Ambulatory Visit (INDEPENDENT_AMBULATORY_CARE_PROVIDER_SITE_OTHER): Payer: Managed Care, Other (non HMO) | Admitting: General Surgery

## 2012-07-07 ENCOUNTER — Encounter: Payer: Self-pay | Admitting: Internal Medicine

## 2012-07-07 ENCOUNTER — Encounter (INDEPENDENT_AMBULATORY_CARE_PROVIDER_SITE_OTHER): Payer: Self-pay | Admitting: General Surgery

## 2012-07-07 VITALS — BP 108/70 | HR 68 | Temp 98.2°F | Resp 16 | Ht 60.0 in | Wt 138.4 lb

## 2012-07-07 DIAGNOSIS — D1779 Benign lipomatous neoplasm of other sites: Secondary | ICD-10-CM

## 2012-07-07 DIAGNOSIS — D171 Benign lipomatous neoplasm of skin and subcutaneous tissue of trunk: Secondary | ICD-10-CM | POA: Insufficient documentation

## 2012-07-07 NOTE — Progress Notes (Signed)
Patient ID: [name], female   DOB: Apr 01, 1961, 51 y.o.   MRN: [id number]  Chief Complaint  Patient presents with  . Cyst    Evaluate lower back cyst    HPI [name] is a 51 y.o. female.   HPI  She is referred by Dr. Fabian [name] for evaluation of a mass in the lower back. It has been there for a long time. It is on the left side of her back. She has some numbness over the anterior portions of her legs sometimes and wondered if this could be related.  No pain from these areas. They have not been getting bigger.  Past Medical History  Diagnosis Date  . Bipolar disease, chronic     hosp Jan 13 [facility] depression borderline dependent traits   . Allergic rhinitis   . Headache   . S/P total hysterectomy     hx of endometriosis  . History of being hospitalized 11-10    [facility], psych  . IBS (irritable bowel syndrome)     GI evaluation for pain IBS dx, Dr. Loreta [name]    Past Surgical History  Procedure Date  . Cholecystectomy   . Total abdominal hysterectomy   . Oophorectomy     Family History  Problem Relation Age of Onset  . Sleep apnea Father   . Migraines Sister     headaches  . Other Mother     MAC infection    Social History History  Substance Use Topics  . Smoking status: Former Games developer  . Smokeless tobacco: Not on file  . Alcohol Use: 1.2 oz/week    2 Cans of beer per week    No Known Allergies  Current Outpatient Prescriptions  Medication Sig Dispense Refill  . diphenhydrAMINE (BENADRYL) 25 MG tablet Take 25 mg by mouth at bedtime as needed. FOR INSOMNIA      . estradiol (ESTRACE) 1 MG tablet TAKE 1 TABLET BY MOUTH EVERY DAY  30 tablet  2  . lamoTRIgine (LAMICTAL) 100 MG tablet Take 100 mg by mouth daily. Take 1/2 tab daily      . lithium 600 MG capsule Take 600 mg by mouth at bedtime.        [name] Kitchen LORazepam (ATIVAN) 0.5 MG tablet Take 0.5 mg by mouth at bedtime.      . ondansetron (ZOFRAN) 4 MG tablet Take 4 mg by mouth every 8 (eight) hours as needed. FOR  NAUSEA      . promethazine (PHENERGAN) 25 MG tablet TAKE 1 TABLET EVERY 4-6 HOURS AS NEEDED FOR NAUSEA AND VOMITING  20 tablet  0  . SUMAtriptan (IMITREX) 100 MG tablet Take 100 mg by mouth every 2 (two) hours as needed.          Review of Systems Review of Systems  Cardiovascular: Positive for chest pain.  Gastrointestinal: Positive for nausea, abdominal pain and abdominal distention.  Neurological: Positive for weakness.    Blood pressure 108/70, pulse 68, temperature 98.2 F (36.8 C), temperature source Temporal, resp. rate 16, height 5' (1.524 m), weight 138 lb 6.4 oz (62.778 kg).  Physical Exam Physical Exam  Constitutional: She appears well-developed and well-nourished. No distress.  Musculoskeletal:       1.5 cm soft tissue mass just to the left of the lower back that is mobile. More subtle 1-2 cm soft tissue mass in the right lower back just to the right of the midline. These are both very mobile. Clinically they appear to be consistent  with lipomas. There is no overlying skin change.    Data Reviewed Note in Epic  Assessment    Benign, nongrowing, nonsymptomatic soft tissue masses of the back. Clinically consistent with lipomas.    Plan    I do not think these need to be removed at this time.  If the mass is become significantly larger or more significantly symptomatic and I told to call back and we would see her and discussed removal.       [name] 07/07/2012, 10:58 AM

## 2012-07-07 NOTE — Patient Instructions (Signed)
If these areas get significantly larger, please call us and we will see you again.

## 2012-07-12 ENCOUNTER — Encounter (INDEPENDENT_AMBULATORY_CARE_PROVIDER_SITE_OTHER): Payer: Self-pay | Admitting: Surgery

## 2012-07-20 ENCOUNTER — Telehealth: Payer: Self-pay | Admitting: Internal Medicine

## 2012-07-20 ENCOUNTER — Telehealth (INDEPENDENT_AMBULATORY_CARE_PROVIDER_SITE_OTHER): Payer: Self-pay | Admitting: General Surgery

## 2012-07-20 NOTE — Telephone Encounter (Signed)
Pt was here on 06/30/12 to see Padonda.  Please advise.  Thanks!!

## 2012-07-20 NOTE — Telephone Encounter (Signed)
Pt called and is req to get referral to neurologist in [place] re: numbness in legs and arms. Pls call.

## 2012-07-20 NOTE — Telephone Encounter (Signed)
Dr. Abbey Chatters does not believe that the benign cysts are responsible for the patient's numbness and pain in her lower extremities.  I recommended she contact her PCP for an appt to be referred to a neurologist for an evaluation.  The patient agreed.

## 2012-07-20 NOTE — Telephone Encounter (Signed)
Pt called today and expressed concerns that the "fatty cysts" on her lower back are causing the numbness in her legs, as it worsens when she lies on her back.  Now beginning to have numbness in arms intermittantly, too.  Reviewed Dr. Maris Berger note in Epic and attempted to explain that the lipomas were not likely to be the source of her numbness.  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 Abbey Chatters.  Offered her appt, but she wants to talk with MD first.

## 2012-07-21 NOTE — Telephone Encounter (Signed)
Please ask her if this is a new sx  She has seen at least 2 neurologist in the past for symptoms    At times [facility]?  Before another referral  Need clarification   Of reason for referral  And then can refer .

## 2012-07-22 NOTE — Telephone Encounter (Signed)
Called the pt.  She was in the parking lot of [facility].  She was sent by Dr. Loreta Ave.  She will call back if any further assistance from our office is needed.

## 2012-07-23 ENCOUNTER — Ambulatory Visit (HOSPITAL_COMMUNITY): Payer: Managed Care, Other (non HMO) | Admitting: Psychiatry

## 2012-08-05 ENCOUNTER — Ambulatory Visit (HOSPITAL_COMMUNITY): Payer: Managed Care, Other (non HMO) | Admitting: Psychiatry

## 2012-08-06 ENCOUNTER — Other Ambulatory Visit: Payer: Self-pay | Admitting: Obstetrics and Gynecology

## 2012-08-06 ENCOUNTER — Telehealth: Payer: Self-pay | Admitting: Internal Medicine

## 2012-08-06 DIAGNOSIS — M8580 Other specified disorders of bone density and structure, unspecified site: Secondary | ICD-10-CM

## 2012-08-06 DIAGNOSIS — Z780 Asymptomatic menopausal state: Secondary | ICD-10-CM

## 2012-08-06 DIAGNOSIS — Z1231 Encounter for screening mammogram for malignant neoplasm of breast: Secondary | ICD-10-CM

## 2012-08-06 NOTE — Telephone Encounter (Signed)
Spoke to the pt.  Instructed her to be seen in the ER/Urgent Care etc.  She has agreed to go.

## 2012-08-06 NOTE — Telephone Encounter (Signed)
Patient Information:  Caller Name: Naleigha  Phone: [phone]  Patient: [name]  Gender: Female  DOB: 1960-08-28  Age: 51 Years  PCP: Berniece Andreas (Family Practice)  Pregnant: No  Office Follow Up:  Does the office need to follow up with this patient?: Yes  Instructions For The Office: Just FYI sent to ED at our and the GI doctor's nurses recommendation  RN Note:  abdominal pain vomiting and diarrhea   spoke to nurse at GI office told her  to go to ED if she couldn't keep anything done .  She is getting light headed when she gets up and her mouth is very dry   Unsure of fever but has been chilling.  About a month ago had norovirus.   She has taken Phenergan and Zofran with no relief of sxs  Symptoms  Reason For Call & Symptoms: vomiting, diarrhea and abdominal pain  Reviewed Health History In EMR: Yes  Reviewed Medications In EMR: Yes  Reviewed Allergies In EMR: Yes  Reviewed Surgeries / Procedures: Yes  Date of Onset of Symptoms: 08/04/2012 OB / GYN:  LMP: Unknown  Guideline(s) Used:  Abdominal Pain - Female  Vomiting  Disposition Per Guideline:   Go to ED Now  Reason For Disposition Reached:   Severe vomiting (e.g., 6 or more times/day)  Advice Given:  N/A

## 2012-08-14 ENCOUNTER — Other Ambulatory Visit: Payer: Self-pay | Admitting: Internal Medicine

## 2012-09-11 ENCOUNTER — Ambulatory Visit (INDEPENDENT_AMBULATORY_CARE_PROVIDER_SITE_OTHER): Payer: Managed Care, Other (non HMO) | Admitting: Internal Medicine

## 2012-09-11 ENCOUNTER — Encounter: Payer: Self-pay | Admitting: Internal Medicine

## 2012-09-11 VITALS — BP 120/70 | Temp 97.5°F | Wt 131.0 lb

## 2012-09-11 DIAGNOSIS — R1031 Right lower quadrant pain: Secondary | ICD-10-CM

## 2012-09-11 DIAGNOSIS — R52 Pain, unspecified: Secondary | ICD-10-CM

## 2012-09-11 DIAGNOSIS — R1032 Left lower quadrant pain: Secondary | ICD-10-CM

## 2012-09-11 LAB — POCT URINALYSIS DIPSTICK
Blood, UA: NEGATIVE
Leukocytes, UA: NEGATIVE
Nitrite, UA: NEGATIVE
Protein, UA: NEGATIVE
pH, UA: 5

## 2012-09-11 NOTE — Patient Instructions (Signed)
Abdominal Pain (Nonspecific)  Your exam might not show the exact reason you have abdominal pain. Since there are many different causes of abdominal pain, another checkup and more tests may be needed. It is very important to follow up for lasting (persistent) or worsening symptoms. A possible cause of abdominal pain in any person who still has his or her appendix is acute appendicitis. Appendicitis is often hard to diagnose. Normal blood tests, urine tests, ultrasound, and CT scans do not completely rule out early appendicitis or other causes of abdominal pain. Sometimes, only the changes that happen over time will allow appendicitis and other causes of abdominal pain to be determined. Other potential problems that may require surgery may also take time to become more apparent. Because of this, it is important that you follow all of the instructions below.  HOME CARE INSTRUCTIONS    Rest as much as possible.   Do not eat solid food until your pain is gone.   While adults or children have pain: A diet of water, weak decaffeinated tea, broth or bouillon, gelatin, oral rehydration solutions (ORS), frozen ice pops, or ice chips may be helpful.   When pain is gone in adults or children: Start a light diet (dry toast, crackers, applesauce, or white rice). Increase the diet slowly as long as it does not bother you. Eat no dairy products (including cheese and eggs) and no spicy, fatty, fried, or high-fiber foods.   Use no alcohol, caffeine, or cigarettes.   Take your regular medicines unless your caregiver told you not to.   Take any prescribed medicine as directed.   Only take over-the-counter or prescription medicines for pain, discomfort, or fever as directed by your caregiver. Do not give aspirin to children.  If your caregiver has given you a follow-up appointment, it is very important to keep that appointment. Not keeping the appointment could result in a permanent injury and/or lasting (chronic) pain and/or  disability. If there is any problem keeping the appointment, you must call to reschedule.   SEEK IMMEDIATE MEDICAL CARE IF:    Your pain is not gone in 24 hours.   Your pain becomes worse, changes location, or feels different.   You or your child has an oral temperature above 102 F (38.9 C), not controlled by medicine.   Your baby is older than 3 months with a rectal temperature of 102 F (38.9 C) or higher.   Your baby is 3 months old or younger with a rectal temperature of 100.4 F (38 C) or higher.   You have shaking chills.   You keep throwing up (vomiting) or cannot drink liquids.   There is blood in your vomit or you see blood in your bowel movements.   Your bowel movements become dark or black.   You have frequent bowel movements.   Your bowel movements stop (become blocked) or you cannot pass gas.   You have bloody, frequent, or painful urination.   You have yellow discoloration in the skin or whites of the eyes.   Your stomach becomes bloated or bigger.   You have dizziness or fainting.   You have chest or back pain.  MAKE SURE YOU:    Understand these instructions.   Will watch your condition.   Will get help right away if you are not doing well or get worse.  Document Released: 08/04/2005 Document Revised: 10/27/2011 Document Reviewed: 07/02/2009  ExitCare Patient Information 2013 [employer].

## 2012-09-11 NOTE — Progress Notes (Signed)
Subjective:    Patient ID: [name], female    DOB: 27-Sep-1960, 52 y.o.   MRN: [id number]  HPI  Pt presents to the clinic today with c/o lower abdominal pain x 24 hours. She has not had any nausea, vomiting or diarrhea. She has not seen any blood in her stools. She has had pain like this in the past when she had endometriosis, but has since then had her uterus and ovaries removed. The pain is constant. She denies urinary symptoms and denies vaginal discharge.  Review of Systems      Past Medical History  Diagnosis Date  . Bipolar disease, chronic     hosp Jan 13 [facility] depression borderline dependent traits   . Allergic rhinitis   . Headache   . S/P total hysterectomy     hx of endometriosis  . History of being hospitalized 11-10    [facility], psych  . IBS (irritable bowel syndrome)     GI evaluation for pain IBS dx, Dr. Loreta Ave    Current Outpatient Prescriptions  Medication Sig Dispense Refill  . diphenhydrAMINE (BENADRYL) 25 MG tablet Take 25 mg by mouth at bedtime as needed. FOR INSOMNIA      . estradiol (ESTRACE) 1 MG tablet TAKE 1 TABLET BY MOUTH DAILY  30 tablet  5  . lithium 600 MG capsule Take 600 mg by mouth at bedtime.        [name] Kitchen LORazepam (ATIVAN) 0.5 MG tablet Take 0.5 mg by mouth at bedtime.      . ondansetron (ZOFRAN) 4 MG tablet Take 4 mg by mouth every 8 (eight) hours as needed. FOR NAUSEA      . promethazine (PHENERGAN) 25 MG tablet TAKE 1 TABLET EVERY 4-6 HOURS AS NEEDED FOR NAUSEA AND VOMITING  20 tablet  0  . SUMAtriptan (IMITREX) 100 MG tablet Take 100 mg by mouth every 2 (two) hours as needed.        . lamoTRIgine (LAMICTAL) 100 MG tablet Take 100 mg by mouth daily. Take 1/2 tab daily        No Known Allergies  Family History  Problem Relation Age of Onset  . Sleep apnea Father   . Migraines Sister     headaches  . Other Mother     MAC infection    History   Social History  . Marital Status: Single    Spouse Name: N/A    Number of Children:  N/A  . Years of Education: N/A   Occupational History  . Not on file.   Social History Main Topics  . Smoking status: Former Games developer  . Smokeless tobacco: Not on file  . Alcohol Use: 1.2 oz/week    2 Cans of beer per week  . Drug Use: No  . Sexually Active: Not on file   Other Topics Concern  . 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   Gastrointestinal: Pt reports lower abdominal pain. Denies bloating, constipation, diarrhea or blood in the stool.  GU: Denies urgency, frequency, pain with urination, burning sensation, blood in urine, odor or discharge.   No other specific complaints in a complete review of systems (except as listed in HPI above).  Objective:   Physical Exam  BP 120/70  Temp 97.5  F (36.4 C) (Oral)  Wt 131 lb (59.421 kg) Wt Readings from Last 3 Encounters:  09/11/12 131 lb (59.421 kg)  07/07/12 138 lb 6.4 oz (62.778 kg)  06/30/12 136 lb (61.689 kg)    General: Appears her stated age, well developed, well nourished in NAD. Cardiovascular: Normal rate and rhythm. S1,S2 noted.  No murmur, rubs or gallops noted. No JVD or BLE edema. No carotid bruits noted. Pulmonary/Chest: Normal effort and positive vesicular breath sounds. No respiratory distress. No wheezes, rales or ronchi noted.  Abdomen: Soft. Tender to deep palpation over the bladder. Normal bowel sounds, no bruits noted. No distention or masses noted. Liver, spleen and kidneys non palpable.        Assessment & Plan:   Abdominal pain, new onset with additional workup required:  Will obtain urinalysis to r/o UTI If negative, will place order for pelvic/abdominal ultrasound  RTC as needed or if symptoms persist

## 2012-09-11 NOTE — Addendum Note (Signed)
Addended by: Melchor Amour on: 09/11/2012 12:04 PM   Modules accepted: Orders

## 2012-09-13 ENCOUNTER — Other Ambulatory Visit: Payer: Self-pay | Admitting: Internal Medicine

## 2012-09-13 DIAGNOSIS — R102 Pelvic and perineal pain: Secondary | ICD-10-CM

## 2012-09-16 ENCOUNTER — Ambulatory Visit
Admission: RE | Admit: 2012-09-16 | Discharge: 2012-09-16 | Disposition: A | Discharge status: Home / Self Care | Payer: Managed Care, Other (non HMO) | Source: Ambulatory Visit | Attending: Internal Medicine | Admitting: Internal Medicine

## 2012-09-16 ENCOUNTER — Other Ambulatory Visit: Payer: Self-pay | Admitting: Internal Medicine

## 2012-09-16 DIAGNOSIS — R102 Pelvic and perineal pain: Secondary | ICD-10-CM

## 2012-09-16 DIAGNOSIS — R1031 Right lower quadrant pain: Secondary | ICD-10-CM

## 2012-09-16 DIAGNOSIS — R1032 Left lower quadrant pain: Secondary | ICD-10-CM

## 2012-09-20 ENCOUNTER — Other Ambulatory Visit: Payer: Self-pay | Admitting: Obstetrics

## 2012-09-20 ENCOUNTER — Ambulatory Visit (INDEPENDENT_AMBULATORY_CARE_PROVIDER_SITE_OTHER): Payer: Managed Care, Other (non HMO) | Admitting: Family Medicine

## 2012-09-20 ENCOUNTER — Encounter: Payer: Self-pay | Admitting: Family Medicine

## 2012-09-20 VITALS — BP 112/62 | HR 82 | Temp 97.8°F | Ht 60.0 in | Wt 134.0 lb

## 2012-09-20 DIAGNOSIS — Z1231 Encounter for screening mammogram for malignant neoplasm of breast: Secondary | ICD-10-CM

## 2012-09-20 DIAGNOSIS — R109 Unspecified abdominal pain: Secondary | ICD-10-CM

## 2012-09-20 NOTE — Patient Instructions (Addendum)
-  please contact Dr. Loreta Ave for an appointment regarding your abdominal pain  -try the diet provided in the meantime  -follow up with your doctor if worsening, new symptoms or if needed

## 2012-09-20 NOTE — Progress Notes (Addendum)
Chief Complaint  Patient presents with  . Pelvic Pain  . Hot Flashes    HPI:  Pt here for Acute visit for Pelvic pain: -started about 7 days ago, LL Abd, crampy, pain is 6/10 at worse, comes and goes -has had issues for years and feels like her doctors are missing something -seen in [place] clinic 1/25 -had TVUS and abd/pelvic US and Urine studies all normal -has hx of IBS - followed by Dr. Loreta Ave -denies: weight loss, fevers, chills, vomiting, diarrhea, nausea, constipation, urinary symptoms, vaginal symptoms, blood in stools, mucus in stools -she feels like she is having hot flashes and she is going see gynecologist in 2 days -reports hx of abd pain on and off - some in December -she feels she needs a colonoscopy for further evaluation  ROS: See pertinent positives and negatives per HPI.  Past Medical History  Diagnosis Date  . Bipolar disease, chronic     hosp Jan 13 [facility] depression borderline dependent traits   . Allergic rhinitis   . Headache   . S/P total hysterectomy     hx of endometriosis  . History of being hospitalized 11-10    [facility], psych  . IBS (irritable bowel syndrome)     GI evaluation for pain IBS dx, Dr. Loreta Ave    Family History  Problem Relation Age of Onset  . Sleep apnea Father   . Migraines Sister     headaches  . Other Mother     MAC infection    History   Social History  . Marital Status: Single    Spouse Name: N/A    Number of Children: N/A  . Years of Education: N/A   Social History Main Topics  . Smoking status: Former Games developer  . Smokeless tobacco: None  . Alcohol Use: 1.2 oz/week    2 Cans of beer per week  . Drug Use: No  . Sexually Active: None   Other Topics Concern  . 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 FOR INSOMNIA, Disp: , Rfl: ;  estradiol (ESTRACE) 1 MG tablet, TAKE 1 TABLET BY MOUTH DAILY, Disp: 30 tablet, Rfl: 5;  lithium 600 MG capsule, Take 600 mg by mouth at bedtime.  , Disp: , Rfl:  LORazepam (ATIVAN) 0.5 MG tablet, Take 1 mg by mouth at bedtime. , Disp: , Rfl: ;  Vitamin D, Ergocalciferol, (DRISDOL) 50000 UNITS CAPS, TAKE 1 EVERY OTHER WEEK FOR 2 MONTHS, Disp: 4 capsule, Rfl: 0;  lamoTRIgine (LAMICTAL) 100 MG tablet, Take 100 mg by mouth daily. Take 1/2 tab daily, Disp: , Rfl: ;  ondansetron (ZOFRAN) 4 MG tablet, Take 4 mg by mouth every 8 (eight) hours as needed. FOR NAUSEA, Disp: , Rfl:  promethazine (PHENERGAN) 25 MG tablet, TAKE 1 TABLET EVERY 4-6 HOURS AS NEEDED FOR NAUSEA AND VOMITING, Disp: 20 tablet, Rfl: 0;  SUMAtriptan (IMITREX) 100 MG tablet, Take 100 mg by mouth every 2 (two) hours as needed.  , Disp: , Rfl:   EXAM:  Filed Vitals:   09/20/12 1137  BP: 112/62  Pulse: 82  Temp: 97.8 F (36.6 C)    Body  mass index is 26.17 kg/(m^2).  GENERAL: vitals reviewed and listed above, alert, oriented, appears well hydrated and in no acute distress  HEENT: atraumatic, conjunttiva clear, no obvious abnormalities on inspection of external nose and ears  NECK: no obvious masses on inspection  LUNGS: clear to auscultation bilaterally, no wheezes, rales or rhonchi, good air movement  CV: HRRR, no peripheral edema  ABD: BS +, soft, no organomegaly - TTP mild in LLQ - no rebound or guarding  MS: moves all extremities without noticeable abnormality  PSYCH: pleasant and cooperative, no obvious depression or anxiety  ASSESSMENT AND PLAN:  Discussed the following assessment and plan:  1. Abdominal pain    -LLQ abd pain, intermittent and crampy in pt with IBS - denies any changes in bowel habits and pt thinks this is not IBS and wants a  colonoscopy. She had negative urine studies and negative US pelvis/abd/TV. Abd exam without any alarming features..She is seeing her gyn doctor in a few days. I suspect this is functional in nature. Advised that most likely cause is her IBS - but she does not think this is the case. Advised she her gastroenterologist to discuss the pain and ? need for colonoscopy. Pt agreed to this plan. Looks like per chart may have had one in 2008 for LLQ pain? Per notes from Dr. Loreta Ave, but can not find procedure report in chart.  Also printed and was going to provide FODMAPs diet to try but pt left when I went to print instructions and diet. -follow up with PCP in 1 week if needed -Patient advised to return or notify a doctor immediately if symptoms worsen or persist or new concerns arise.  Patient Instructions  -please contact Dr. Loreta Ave for an appointment regarding your abdominal pain  -try the diet provided in the meantime  -follow up with your doctor if worsening, new symptoms or if needed       KIM, [facility]

## 2012-10-02 ENCOUNTER — Other Ambulatory Visit: Payer: Self-pay

## 2012-10-21 ENCOUNTER — Other Ambulatory Visit: Payer: Self-pay | Admitting: Obstetrics

## 2012-10-21 ENCOUNTER — Ambulatory Visit
Admission: RE | Admit: 2012-10-21 | Discharge: 2012-10-21 | Disposition: A | Discharge status: Home / Self Care | Payer: Managed Care, Other (non HMO) | Source: Ambulatory Visit | Attending: Obstetrics | Admitting: Obstetrics

## 2012-10-21 DIAGNOSIS — R109 Unspecified abdominal pain: Secondary | ICD-10-CM

## 2012-10-21 DIAGNOSIS — R634 Abnormal weight loss: Secondary | ICD-10-CM

## 2012-10-21 MED ORDER — IOHEXOL 300 MG/ML  SOLN
100.0000 mL | Freq: Once | INTRAMUSCULAR | Status: AC | PRN
  Administered 2012-10-21: 100 mL via INTRAVENOUS

## 2012-10-28 ENCOUNTER — Telehealth: Payer: Self-pay | Admitting: Internal Medicine

## 2012-10-28 NOTE — Telephone Encounter (Signed)
Patient called stating that she was seen in the ER on Tuesday for abdominal pain and her labs show renal abnormality and her lungs have atelectasis. Patient is requesting to be seen by her pcp asap. Please advise.

## 2012-10-28 NOTE — Telephone Encounter (Signed)
disregard request

## 2012-10-29 ENCOUNTER — Telehealth: Payer: Self-pay | Admitting: Internal Medicine

## 2012-10-29 NOTE — Telephone Encounter (Signed)
Patient Information:  Caller Name: Kirsta  Phone: [phone]  Patient: [name]  Gender: Female  DOB: Mar 13, 1961  Age: 52 Years  PCP: Berniece Andreas (Family Practice)  Pregnant: No  Office Follow Up:  Does the office need to follow up with this patient?: No  Instructions For The Office: N/A   Symptoms  Reason For Call & Symptoms: Pt calling today 10/29/12 regarding having pelvic pain.  Has seen her GI doctor and GYN doctor and endocospy and colonoscopy were done and came back good.  Pelvic exam was done and MD said she was referring her to a surgeon because could be endometriosis or adhesions.  Pelvic pain has gotten worse.  She has left message for GYN but she has not heard back from them.  She is currently taking Tramadol which helps but does not make it go totally away.  Was in ED on 10/26/12 and labs were done and CT of abdomen was done.  CT was normal except showed some atelectasis of lower basis of lungs and they did not really say anything to her about the labs.  Reviewed Health History In EMR: Yes  Reviewed Medications In EMR: Yes  Reviewed Allergies In EMR: Yes  Reviewed Surgeries / Procedures: Yes  Date of Onset of Symptoms: 08/31/2012  Treatments Tried: Tramadol  Treatments Tried Worked: No  Any Fever: Yes  Fever Taken: Tactile  Fever Time Of Reading: 12:30:00  Fever Last Reading: N/A OB / GYN:  LMP: Unknown  Guideline(s) Used:  Abdominal Pain - Female  Disposition Per Guideline:   Go to ED Now (or to Office with PCP Approval)  Reason For Disposition Reached:   Constant abdominal pain lasting > 2 hours  Advice Given:  N/A  Patient Refused Recommendation:  Patient Will Follow Up With Office Later  Pt will call back if she does not hear from her GYN doctor.  Just wanted to let MD know what was going on. Triager offered app with Dr. Selena Batten because Dr. Fabian Sharp did not have any openings.  Pt refused.  Said she would only come in if she can see Dr. Fabian Sharp.  Triage  offered to send note to see if she could be worked in for Dr. Fabian Sharp, she declined.  York Spaniel will call back if she does not hear from her GYN.

## 2012-10-29 NOTE — Telephone Encounter (Signed)
For your review

## 2012-11-08 ENCOUNTER — Telehealth (INDEPENDENT_AMBULATORY_CARE_PROVIDER_SITE_OTHER): Payer: Self-pay | Admitting: General Surgery

## 2012-11-08 ENCOUNTER — Ambulatory Visit (INDEPENDENT_AMBULATORY_CARE_PROVIDER_SITE_OTHER): Payer: Managed Care, Other (non HMO) | Admitting: General Surgery

## 2012-11-08 ENCOUNTER — Encounter (INDEPENDENT_AMBULATORY_CARE_PROVIDER_SITE_OTHER): Payer: Self-pay | Admitting: General Surgery

## 2012-11-08 VITALS — BP 118/80 | HR 86 | Temp 96.4°F | Resp 16 | Ht 60.0 in | Wt 131.8 lb

## 2012-11-08 DIAGNOSIS — R634 Abnormal weight loss: Secondary | ICD-10-CM

## 2012-11-08 DIAGNOSIS — R6339 Other feeding difficulties: Secondary | ICD-10-CM

## 2012-11-08 DIAGNOSIS — R633 Feeding difficulties, unspecified: Secondary | ICD-10-CM

## 2012-11-08 DIAGNOSIS — R109 Unspecified abdominal pain: Secondary | ICD-10-CM

## 2012-11-08 HISTORY — DX: Other feeding difficulties: R63.39

## 2012-11-08 LAB — COMPREHENSIVE METABOLIC PANEL
ALT: 10 U/L (ref 0–35)
AST: 13 U/L (ref 0–37)
CO2: 26 mEq/L (ref 19–32)
Creat: 0.68 mg/dL (ref 0.50–1.10)
Total Bilirubin: 0.5 mg/dL (ref 0.3–1.2)

## 2012-11-08 LAB — CBC
HCT: 38.3 % (ref 36.0–46.0)
MCV: 89.7 fL (ref 78.0–100.0)
RDW: 13 % (ref 11.5–15.5)
WBC: 5.6 10*3/uL (ref 4.0–10.5)

## 2012-11-08 NOTE — Telephone Encounter (Signed)
Spoke with patient she is aware of appt s on 11/16/12 @ 1:55, [address]  Then 11/18/12 at 930am @ [facility] . And follow up with Dr on 11/19/12 @900  am

## 2012-11-08 NOTE — Assessment & Plan Note (Signed)
This seems to come from abdominal pain and cramping, but is cause of weight loss.   Unlikely to be from psychiatric issues.

## 2012-11-08 NOTE — Assessment & Plan Note (Signed)
Patient's history and physical examination do not seem to clearly point to one etiology of abdominal pain. I think the most likely potential causes of her abdominal pain would be adhesions or endometriosis. I don't find any evidence of a hernia on physical examination, but this is also a possibility. Because of her weight loss, I would like to rule out vascular insufficiency. In order to better assess for adhesions, I'm ordering a small follow-through. Hypercalcemia can also present with vague abdominal pain, but this is unlikely to cause weight loss. She also could have something like levator spasm considering she has pain going into her vaginal area.  I will see her back in one week to review these studies. We may end up in the operating room with diagnostic laparoscopy. There's always a chance that we have an occult malignancy. She has had a endoscopy and colonoscopy which do not reveal any evidence of masses. The CT scan does not reveal any masses. On diagnostic laparoscopy we could evaluate this possibility as well. Certainly if we end up in the operating room, I would like Dr. Ernestina Penna to be available in the event that endometriosis is revealed.

## 2012-11-08 NOTE — Assessment & Plan Note (Signed)
Unclear source of weight loss. I am concerned about vascular insufficiency for pain out of proportion to physical exam and food aversion. Will get CT angiogram to evaluate.

## 2012-11-08 NOTE — Progress Notes (Signed)
Chief Complaint  Patient presents with  . New Evaluation    eval abd pain and weight loss    HISTORY: Patient is a 52 year old female who presents with around 3-4 months of abdominal pain and weight loss. She has lost around 15 pounds in this time. This has never been typical for her in the past. She has always had to exercise to lose weight. Since December she has started feeling very full and not having any appetite.  When she eats, she describes severe crampy abdominal pain.  This seems to be the worst in the left lower quadrant. However, she does experience symptoms across the entire abdomen. She does complain of some distention when she eats as well. She has had her her gallbladder removed.  She has a history of endometriosis. She has had all of her gynecologic organs removed over multiple surgeries. She last had surgery around 10 years ago, and has felt well until around December of last year.  She has gotten where she is only eating cottage cheese and yogurt. She has not found any relationship with different types of food other than the pain is worse with solid food. She is not hungry. She only has a bowel movement around every 3 days, but it is very soft. She does not have to strain to have bowel movements. She doesn't get the urge to defecate. She says the pain in the left lower quadrant does seem to extend into the pelvic region and into her vagina. She has had to go to the ER before for the severity of the pain. She has undergone an upper endoscopy with some mild inflammation of the stomach. She had a colonoscopy that was unrevealing. This was all within the last month. A CT scan is negative for masses or signs of obstruction. She does have the pain even when she doesn't eat. She has seen her gynecologist for this pain. Pelvic exam demonstrates tenderness in the left lower quadrant as well.  Past Medical History  Diagnosis Date  . Bipolar disease, chronic     hosp Jan 13 [facility] depression  borderline dependent traits   . Allergic rhinitis   . Headache   . S/P total hysterectomy     hx of endometriosis  . History of being hospitalized 11-10    [facility], psych  . IBS (irritable bowel syndrome)     GI evaluation for pain IBS dx, Dr. Loreta Ave    Past Surgical History  Procedure Laterality Date  . Cholecystectomy    . Total abdominal hysterectomy    . Oophorectomy      Current Outpatient Prescriptions  Medication Sig Dispense Refill  . buPROPion (WELLBUTRIN XL) 150 MG 24 hr tablet Take 150 mg by mouth daily.      . diphenhydrAMINE (BENADRYL) 25 MG tablet Take 25 mg by mouth at bedtime as needed. FOR INSOMNIA      . estradiol (ESTRACE) 1 MG tablet TAKE 1 TABLET BY MOUTH DAILY  30 tablet  5  . lamoTRIgine (LAMICTAL) 100 MG tablet Take 100 mg by mouth daily. Take 1/2 tab daily      . lithium 600 MG capsule Take 600 mg by mouth at bedtime.        [name] Kitchen LORazepam (ATIVAN) 0.5 MG tablet Take 1 mg by mouth at bedtime.       . ondansetron (ZOFRAN) 4 MG tablet Take 4 mg by mouth every 8 (eight) hours as needed. FOR NAUSEA      . promethazine (  PHENERGAN) 25 MG tablet TAKE 1 TABLET EVERY 4-6 HOURS AS NEEDED FOR NAUSEA AND VOMITING  20 tablet  0  . SUMAtriptan (IMITREX) 100 MG tablet Take 100 mg by mouth every 2 (two) hours as needed.        . Vitamin D, Ergocalciferol, (DRISDOL) 50000 UNITS CAPS TAKE 1 EVERY OTHER WEEK FOR 2 MONTHS  4 capsule  0   No current facility-administered medications for this visit.     No Known Allergies   Family History  Problem Relation Age of Onset  . Sleep apnea Father   . Migraines Sister     headaches  . Other Mother     MAC infection     History   Social History  . Marital Status: Single    Spouse Name: N/A    Number of Children: N/A  . Years of Education: N/A   Social History Main Topics  . Smoking status: Former Games developer  . Smokeless tobacco: None  . Alcohol Use: 1.2 oz/week    2 Cans of beer per week  . Drug Use: No  . Sexually  Active: None   Other Topics Concern  . 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  Well nourished and well groomed.  Looks fatigued.   Neurological: Alert and oriented to person, place, and time. Coordination normal.  Head: Normocephalic and atraumatic.  Eyes: Conjunctivae are normal. Pupils are equal, round, and reactive to light. No scleral icterus.  Neck: Normal range of motion. Neck supple. No tracheal deviation or thyromegaly present.  Cardiovascular: Normal rate, regular rhythm,  and intact distal pulses.   Respiratory: Effort normal.  No respiratory distress. No chest wall tenderness.  GI: Soft. The abdomen is soft and non distended.  There is no rebound and no guarding. There is mild diffuse tenderness.  There is some tenderness in the LLQ and along the lateral left rectus abdominus border.  I don't feel any hernia.   Rectal exam is normal.  Normal tone, non tender, no evidence of external hemorrhoids.   Musculoskeletal: Normal range of motion. Extremities are nontender.  Lymphadenopathy: No cervical, preauricular, postauricular or axillary adenopathy is present Skin: Skin is warm and dry. No rash noted. No diaphoresis. No erythema. No pallor. No clubbing, cyanosis, or edema.   Psychiatric: Normal mood and affect. Behavior is normal. Judgment and thought content normal.    LABORATORY RESULTS: Available labs are reviewed  No significant abnormalities from labs in November/december.    RADIOLOGY RESULTS: See E-Chart or I-Site for most recent results.  Images and reports are reviewed. Ct   IMPRESSION:  1. No explanation for the patient's abdominal pain and weight loss  is seen.  2. Scattered rectosigmoid colonic diverticula.  3. The appendix and terminal ileum are unremarkable.  Original Report Authenticated By: Dwyane Dee, M.D.     ASSESSMENT AND PLAN: Loss of weight Unclear source of weight loss. I am concerned about vascular insufficiency for pain out of proportion to physical exam and food aversion. Will get CT  angiogram to evaluate.    Food aversion This seems to come from abdominal pain and cramping, but is cause of weight loss.   Unlikely to be from psychiatric issues.    Abdominal  pain, other specified site Patient's history and physical examination do not seem to clearly point to one etiology of abdominal pain. I think the most likely potential causes of her abdominal pain would be adhesions or endometriosis. I don't find any evidence of a hernia on physical examination, but this is also a possibility. Because of her weight loss, I would like to rule out vascular insufficiency. In order to better assess for adhesions, I'm ordering a small follow-through. Hypercalcemia can also present with vague abdominal pain, but this is unlikely to cause weight loss. She also could have something like levator spasm considering she has pain going into her vaginal area.  I will see her back in one week to review these studies. We may end up in the operating room with diagnostic laparoscopy. There's always a chance that we have an occult malignancy. She has had a endoscopy and colonoscopy which do not reveal any evidence of masses. The CT scan does not reveal any masses. On diagnostic laparoscopy we could evaluate this possibility as well. Certainly if we end up in the operating room, I would like Dr. Ernestina Penna to be available in the event that endometriosis is revealed.       Maudry Diego MD Surgical Oncology, General and Endocrine Surgery [facility]      Visit Diagnoses: 1. Loss of weight   2. Food aversion   3. Abdominal  pain, other specified site     Primary Care Physician: Lorretta Harp, MD  [facility]

## 2012-11-09 ENCOUNTER — Telehealth (INDEPENDENT_AMBULATORY_CARE_PROVIDER_SITE_OTHER): Payer: Self-pay | Admitting: *Deleted

## 2012-11-09 NOTE — Telephone Encounter (Signed)
Patient called to report increased pain and unable to eat.  Patient states she is scheduled for procedures but they could not be scheduled until next week.  Patient concerned about what to do between now and then for pain and not eating.

## 2012-11-09 NOTE — Telephone Encounter (Signed)
Spoke to [place] MD who states she is not comfortable prescribing narcotic medications at this time since there has not been anything identified as surgical.  It was recommended to the patient that if she needs pain control and is unable to eat then she may want to be evaluated in the ED at this time.  Patient states she doesn't know that it is that bad at this time but if it gets any worse then she will.

## 2012-11-11 ENCOUNTER — Telehealth (INDEPENDENT_AMBULATORY_CARE_PROVIDER_SITE_OTHER): Payer: Self-pay | Admitting: *Deleted

## 2012-11-11 ENCOUNTER — Telehealth: Payer: Self-pay | Admitting: Internal Medicine

## 2012-11-11 NOTE — Telephone Encounter (Signed)
Caller: [name]/Patient; Phone: [phone]; Reason for Call: Patient is calling to update Dr.  Fabian Sharp of what is going on with her since she is her PCP.  Over last few months have had weight loss of 15 lbs.  She is nauseated, feeling full and hurting in low pelvic area.  Pain is becoming more constant and when present is an 8/10 on pain scale.  She is trying to drink nutritional supplements which seems better than food.  She tried to see Dr.  Fabian Sharp about a week ago and was told would be a week before she saw anyone.  Because of her difficulties she has had to quit working.  She has been to her GI doctor who did an Endoscopy and Colonoscopy.  Her OB-GYN provider has done pelvics and thinks it may be adhesions.  She is currently seeing a surge ron who is trying to determine if she needs to do a laparoscopy to explore her cause.  Surgeon does not think it is a mass and she can not rule out a mass.  The next test she is scheduled for is for a Vascular CT scan.  She asks that if Dr.  Fabian Sharp feels like she know anything from her history that would be a contributing factor to please let her know.

## 2012-11-11 NOTE — Telephone Encounter (Signed)
Patient called today to ask about Byerly MD filling out FMLA paperwork so she can remain out of work and not lose her job.  Patient states due to her symptoms of pain and lethargic due to not eating she is unable to work at this time.  This RN explained to patient that at this time it has not been decided if patient is a surgical patient and until we have the procedures resulted next week that can not be determined so at this time it would not be appropriate for Byerly MD to be the one to fill out the paperwork.  Suggested to patient that she talk with one of her primary MD's regarding the paperwork and the patient informed me that she wanted to ask Donell Beers MD personally about the [facility] paperwork.  Patient is setup for an appt next Friday April 4th 2014 so she stated she would discuss FMLA paperwork at that time with Donell Beers MD.

## 2012-11-12 NOTE — Telephone Encounter (Signed)
Left message on below listed number for the pt to return my call.

## 2012-11-12 NOTE — Telephone Encounter (Signed)
Patient returned call.  Tried calling her back and left message at below listed number.

## 2012-11-12 NOTE — Telephone Encounter (Signed)
i reviewed the surgeons  note  And understand the evaluation proceeding .  Don't have any other additional  ideas about why this is happening at this time .   Keep me informed as you and  Your surgeon as doing.

## 2012-11-15 NOTE — Telephone Encounter (Signed)
Left message on below listed number for the patient to return my call.

## 2012-11-16 ENCOUNTER — Ambulatory Visit
Admission: RE | Admit: 2012-11-16 | Discharge: 2012-11-16 | Disposition: A | Discharge status: Home / Self Care | Payer: Managed Care, Other (non HMO) | Source: Ambulatory Visit | Attending: General Surgery | Admitting: General Surgery

## 2012-11-16 DIAGNOSIS — R109 Unspecified abdominal pain: Secondary | ICD-10-CM

## 2012-11-16 MED ORDER — IOHEXOL 350 MG/ML SOLN
80.0000 mL | Freq: Once | INTRAVENOUS | Status: AC | PRN
  Administered 2012-11-16: 80 mL via INTRAVENOUS

## 2012-11-16 NOTE — Telephone Encounter (Signed)
Tried reaching the patient.  Received static on the other end of the line but no voice.  Will try again at a later time.

## 2012-11-17 NOTE — Telephone Encounter (Signed)
Patient notified by telephone. 

## 2012-11-18 ENCOUNTER — Encounter (INDEPENDENT_AMBULATORY_CARE_PROVIDER_SITE_OTHER): Payer: Managed Care, Other (non HMO) | Admitting: General Surgery

## 2012-11-18 ENCOUNTER — Ambulatory Visit (HOSPITAL_COMMUNITY)
Admission: RE | Admit: 2012-11-18 | Discharge: 2012-11-18 | Disposition: A | Discharge status: Home / Self Care | Payer: Managed Care, Other (non HMO) | Source: Ambulatory Visit | Attending: General Surgery | Admitting: General Surgery

## 2012-11-18 DIAGNOSIS — R634 Abnormal weight loss: Secondary | ICD-10-CM | POA: Insufficient documentation

## 2012-11-18 DIAGNOSIS — R109 Unspecified abdominal pain: Secondary | ICD-10-CM | POA: Insufficient documentation

## 2012-11-18 DIAGNOSIS — Z90710 Acquired absence of both cervix and uterus: Secondary | ICD-10-CM | POA: Insufficient documentation

## 2012-11-18 DIAGNOSIS — R110 Nausea: Secondary | ICD-10-CM | POA: Insufficient documentation

## 2012-11-19 ENCOUNTER — Ambulatory Visit (INDEPENDENT_AMBULATORY_CARE_PROVIDER_SITE_OTHER): Payer: Managed Care, Other (non HMO) | Admitting: General Surgery

## 2012-11-19 VITALS — BP 128/76 | HR 84 | Temp 97.7°F | Resp 18 | Ht 60.0 in | Wt 129.0 lb

## 2012-11-19 DIAGNOSIS — R109 Unspecified abdominal pain: Secondary | ICD-10-CM

## 2012-11-19 NOTE — Progress Notes (Signed)
HISTORY: Patient is a 51-year-old female who I saw a week ago for abdominal pain. She has food fear and weight loss. I ordered a CT angio to rule out vascular issues in the small bowel follow-through to assess for adhesions. These studies were normal. She continues to have increased difficulty eating. She feels full after several bites.  She is so weak she feels unable to work. She states that she almost passed out while shopping.    PERTINENT REVIEW OF SYSTEMS: Otherwise negative.     EXAM: Head: Normocephalic and atraumatic.  Eyes:  Conjunctivae are normal. Pupils are equal, round, and reactive to light. No scleral icterus.  Resp: No respiratory distress, normal effort. Abd:  Abdomen is soft, non distended and non tender. No masses are palpable.  There is no rebound and no guarding.  Neurological: Alert and oriented to person, place, and time. Coordination normal.  Skin: Skin is warm and dry. No rash noted. No diaphoretic. No erythema. No pallor.  Psychiatric: Normal mood and affect. Normal behavior. Judgment and thought content normal.      ASSESSMENT AND PLAN:   Abdominal  pain, other specified site All of our studies are negative for pathology. However, the patient continues to have severe abdominal pain and weight loss.  I discussed this patient with Dr. Fogleman.  Given the severity of her symptoms, we will do a diagnostic laparoscopy. We would look for endometriosis, hernias, masses, adhesions, or other pathology. There are no other tests to order that would be revealing.  I have advised the patient of the risk of bleeding, infection, damage to adjacent structures, bowel injury, fistulas, and failure to find the cause of her pain.  She understands and wishes to proceed.      Thressa Shiffer L Thornton Dohrmann, MD Surgical Oncology, General & Endocrine Surgery [facility]  PANOSH,WANDA KOTVAN, MD Panosh, Wanda K, MD   

## 2012-11-19 NOTE — Assessment & Plan Note (Signed)
All of our studies are negative for pathology. However, the patient continues to have severe abdominal pain and weight loss.  I discussed this patient with Dr. Ernestina Penna.  Given the severity of her symptoms, we will do a diagnostic laparoscopy. We would look for endometriosis, hernias, masses, adhesions, or other pathology. There are no other tests to order that would be revealing.  I have advised the patient of the risk of bleeding, infection, damage to adjacent structures, bowel injury, fistulas, and failure to find the cause of her pain.  She understands and wishes to proceed.

## 2012-11-19 NOTE — Patient Instructions (Signed)
Try your best to get as many calories/ protein as possible before surgery.  Keep up your energy as best as possible.

## 2012-11-29 ENCOUNTER — Telehealth: Payer: Self-pay | Admitting: Internal Medicine

## 2012-11-29 ENCOUNTER — Ambulatory Visit: Payer: Managed Care, Other (non HMO) | Admitting: Internal Medicine

## 2012-11-29 NOTE — Telephone Encounter (Signed)
PT called and stated that she needs a new RX of ondansetron (ZOFRAN) 4 MG tablet called into her pharmacy. Please assist.

## 2012-11-30 ENCOUNTER — Other Ambulatory Visit: Payer: Self-pay | Admitting: Family Medicine

## 2012-11-30 ENCOUNTER — Telehealth: Payer: Self-pay | Admitting: Internal Medicine

## 2012-11-30 MED ORDER — ONDANSETRON HCL 4 MG PO TABS: 4.0000 mg | ORAL_TABLET | Freq: Three times a day (TID) | ORAL | Status: DC | PRN

## 2012-11-30 NOTE — Telephone Encounter (Signed)
havent refilled this for a while    Ok to rx 24 # renewal.   Please have her keep [place] updated about her  Eating and nausea evaluation.

## 2012-11-30 NOTE — Telephone Encounter (Signed)
Patient called stating that she need a refill of her imitrex 100 mg 1 poq 2 hrs prn [facility] to [facility] [address]. Please assist.

## 2012-11-30 NOTE — Telephone Encounter (Signed)
Sent by e-scribe. 

## 2012-12-01 ENCOUNTER — Other Ambulatory Visit: Payer: Self-pay | Admitting: Family Medicine

## 2012-12-01 ENCOUNTER — Encounter (HOSPITAL_COMMUNITY): Payer: Self-pay | Admitting: Pharmacy Technician

## 2012-12-01 MED ORDER — SUMATRIPTAN SUCCINATE 100 MG PO TABS: 100.0000 mg | ORAL_TABLET | ORAL | Status: DC | PRN

## 2012-12-01 NOTE — Telephone Encounter (Signed)
agree with refill and ROV  plan

## 2012-12-01 NOTE — Telephone Encounter (Signed)
EHR show that i have not prescribed this medication  For over 3 years Dr Rodena Medin did in 2012    Please ask pt if she has been taking this med for the last 2 years and if so whi is prescribing  name. How often is she using this?   We may be able to rx this  And then have her ROV .( 2 slots ) for medication management.  Thanks

## 2012-12-01 NOTE — Telephone Encounter (Signed)
I called the pharmacy.  This was last filled by Dr. Rodena Medin on 09/15/11.  I spoke to the patient and she says she is using less than 1 pill monthly.  I sent in 1 refill as she has made an appt with WP next Wednesday @ 12:30.  She wanted WP to know that she is having groin pain.  It feels like "something is wrapped around something" or there is a "tugging" feeling.  She also continues to have chest pain from time to time but refuses ED.  She wanted a referral for mammo.  Instructed her to call and make an appt and no referral is needed for this.  She is having exploratory surgery on the 29th.

## 2012-12-01 NOTE — Telephone Encounter (Signed)
Patient last seen 02/16/12 for an acute visit.  Please advise.   Thanks!!

## 2012-12-02 NOTE — Progress Notes (Signed)
Stress eccho 6/12 EPIC, CT abdomen 3/145 EPIC with mention lung bases clear

## 2012-12-02 NOTE — Patient Instructions (Addendum)
[name]  12/02/2012   Your procedure is scheduled on:12/14/12  TUESDAY    Report to [facility] at  1000     AM. TUESDAY  Call this number if you have problems the morning of surgery: [phone]       Remember:   Do not eat food  Or drink :After Midnight    Monday NIGHT   Take these medicines the morning of surgery with A SIP OF WATER:Wellbutrin                                                                      May take Lorcet, Zofran, or Tramadol if needed   .  Contacts, dentures or partial plates can not be worn to surgery  Leave suitcase in the car. After surgery it may be brought to your room.  For patients admitted to the hospital, checkout time is 11:00 AM day of  discharge.             SPECIAL INSTRUCTIONS- SEE [place] PREPARING FOR SURGERY INSTRUCTION SHEET-     DO NOT WEAR JEWELRY, LOTIONS, POWDERS, OR PERFUMES.  WOMEN-- DO NOT SHAVE LEGS OR UNDERARMS FOR 12 HOURS BEFORE SHOWERS. MEN MAY SHAVE FACE.  Patients discharged the day of surgery will not be allowed to drive home. IF going home the day of surgery, you must have a driver and someone to stay with you for the first 24 hours  Name and phone number of your driver:     Mom, sister                                                                   Please read over the following fact sheets that you were given: MRSA Information, Incentive Spirometry Sheet, Blood Transfusion Sheet  Information                                                                                   [name]  PST [phone]  [phone]                 FAILURE TO FOLLOW THESE INSTRUCTIONS MAY RESULT IN  CANCELLATION   OF YOUR SURGERY                                                  Patient Signature _____________________________

## 2012-12-06 ENCOUNTER — Encounter (HOSPITAL_COMMUNITY): Payer: Self-pay

## 2012-12-06 ENCOUNTER — Encounter (HOSPITAL_COMMUNITY)
Admission: RE | Admit: 2012-12-06 | Discharge: 2012-12-06 | Disposition: A | Discharge status: Home / Self Care | Payer: Managed Care, Other (non HMO) | Source: Ambulatory Visit | Attending: General Surgery | Admitting: General Surgery

## 2012-12-06 LAB — SURGICAL PCR SCREEN
MRSA, PCR: NEGATIVE
Staphylococcus aureus: NEGATIVE

## 2012-12-06 LAB — CBC WITH DIFFERENTIAL/PLATELET
Basophils Absolute: 0 10*3/uL (ref 0.0–0.1)
Basophils Relative: 1 % (ref 0–1)
Eosinophils Absolute: 0.3 10*3/uL (ref 0.0–0.7)
Eosinophils Relative: 4 % (ref 0–5)
HCT: 40.8 % (ref 36.0–46.0)
Hemoglobin: 13.9 g/dL (ref 12.0–15.0)
Lymphocytes Relative: 21 % (ref 12–46)
Lymphs Abs: 1.4 10*3/uL (ref 0.7–4.0)
MCH: 30.6 pg (ref 26.0–34.0)
MCHC: 34.1 g/dL (ref 30.0–36.0)
MCV: 89.9 fL (ref 78.0–100.0)
Monocytes Absolute: 0.6 10*3/uL (ref 0.1–1.0)
Monocytes Relative: 8 % (ref 3–12)
Neutro Abs: 4.4 10*3/uL (ref 1.7–7.7)
Neutrophils Relative %: 67 % (ref 43–77)
Platelets: 298 10*3/uL (ref 150–400)
RBC: 4.54 MIL/uL (ref 3.87–5.11)
RDW: 12 % (ref 11.5–15.5)
WBC: 6.6 10*3/uL (ref 4.0–10.5)

## 2012-12-06 LAB — COMPREHENSIVE METABOLIC PANEL
ALT: 17 U/L (ref 0–35)
AST: 19 U/L (ref 0–37)
Albumin: 3.7 g/dL (ref 3.5–5.2)
Alkaline Phosphatase: 60 U/L (ref 39–117)
BUN: 10 mg/dL (ref 6–23)
CO2: 23 mEq/L (ref 19–32)
Calcium: 9.8 mg/dL (ref 8.4–10.5)
Chloride: 105 mEq/L (ref 96–112)
Creatinine, Ser: 0.64 mg/dL (ref 0.50–1.10)
GFR calc Af Amer: >90 mL/min (ref >90)
GFR calc non Af Amer: >90 mL/min (ref >90)
Glucose, Bld: 78 mg/dL (ref 70–99)
Potassium: 4.1 mEq/L (ref 3.5–5.1)
Sodium: 138 mEq/L (ref 135–145)
Total Bilirubin: 0.6 mg/dL (ref 0.3–1.2)
Total Protein: 7.1 g/dL (ref 6.0–8.3)

## 2012-12-06 LAB — URINALYSIS, ROUTINE W REFLEX MICROSCOPIC
Bilirubin Urine: NEGATIVE
Ketones, ur: NEGATIVE mg/dL
Leukocytes, UA: NEGATIVE
Nitrite: NEGATIVE
Protein, ur: NEGATIVE mg/dL
Urobilinogen, UA: 0.2 mg/dL (ref 0.0–1.0)
pH: 6.5 (ref 5.0–8.0)

## 2012-12-08 ENCOUNTER — Ambulatory Visit: Payer: Managed Care, Other (non HMO) | Admitting: Internal Medicine

## 2012-12-11 ENCOUNTER — Encounter (HOSPITAL_COMMUNITY): Payer: Self-pay

## 2012-12-11 ENCOUNTER — Emergency Department (HOSPITAL_COMMUNITY)
Admission: EM | Admit: 2012-12-11 | Discharge: 2012-12-11 | Disposition: A | Discharge status: Home / Self Care | Payer: Managed Care, Other (non HMO) | Attending: Emergency Medicine | Admitting: Emergency Medicine

## 2012-12-11 ENCOUNTER — Emergency Department (HOSPITAL_COMMUNITY): Payer: Managed Care, Other (non HMO)

## 2012-12-11 DIAGNOSIS — R110 Nausea: Secondary | ICD-10-CM | POA: Insufficient documentation

## 2012-12-11 DIAGNOSIS — F319 Bipolar disorder, unspecified: Secondary | ICD-10-CM | POA: Insufficient documentation

## 2012-12-11 DIAGNOSIS — Z8719 Personal history of other diseases of the digestive system: Secondary | ICD-10-CM | POA: Insufficient documentation

## 2012-12-11 DIAGNOSIS — Z87891 Personal history of nicotine dependence: Secondary | ICD-10-CM | POA: Insufficient documentation

## 2012-12-11 DIAGNOSIS — Z9079 Acquired absence of other genital organ(s): Secondary | ICD-10-CM | POA: Insufficient documentation

## 2012-12-11 DIAGNOSIS — Z8669 Personal history of other diseases of the nervous system and sense organs: Secondary | ICD-10-CM | POA: Insufficient documentation

## 2012-12-11 DIAGNOSIS — Z79899 Other long term (current) drug therapy: Secondary | ICD-10-CM | POA: Insufficient documentation

## 2012-12-11 DIAGNOSIS — Z9089 Acquired absence of other organs: Secondary | ICD-10-CM | POA: Insufficient documentation

## 2012-12-11 DIAGNOSIS — M79609 Pain in unspecified limb: Secondary | ICD-10-CM | POA: Insufficient documentation

## 2012-12-11 DIAGNOSIS — R5381 Other malaise: Secondary | ICD-10-CM | POA: Insufficient documentation

## 2012-12-11 DIAGNOSIS — Z8709 Personal history of other diseases of the respiratory system: Secondary | ICD-10-CM | POA: Insufficient documentation

## 2012-12-11 DIAGNOSIS — Z90710 Acquired absence of both cervix and uterus: Secondary | ICD-10-CM | POA: Insufficient documentation

## 2012-12-11 DIAGNOSIS — E860 Dehydration: Secondary | ICD-10-CM | POA: Insufficient documentation

## 2012-12-11 LAB — COMPREHENSIVE METABOLIC PANEL
AST: 21 U/L (ref 0–37)
Albumin: 3.5 g/dL (ref 3.5–5.2)
Calcium: 9.6 mg/dL (ref 8.4–10.5)
Creatinine, Ser: 0.65 mg/dL (ref 0.50–1.10)
Sodium: 137 mEq/L (ref 135–145)
Total Protein: 6.7 g/dL (ref 6.0–8.3)

## 2012-12-11 LAB — URINALYSIS, ROUTINE W REFLEX MICROSCOPIC
Ketones, ur: NEGATIVE mg/dL
Leukocytes, UA: NEGATIVE
Nitrite: NEGATIVE
Specific Gravity, Urine: 1.008 (ref 1.005–1.030)
pH: 8 (ref 5.0–8.0)

## 2012-12-11 LAB — CBC WITH DIFFERENTIAL/PLATELET
Basophils Absolute: 0 10*3/uL (ref 0.0–0.1)
Basophils Relative: 1 % (ref 0–1)
Eosinophils Relative: 4 % (ref 0–5)
HCT: 38.3 % (ref 36.0–46.0)
MCHC: 33.7 g/dL (ref 30.0–36.0)
MCV: 90.5 fL (ref 78.0–100.0)
Monocytes Absolute: 0.6 10*3/uL (ref 0.1–1.0)
Platelets: 275 10*3/uL (ref 150–400)
RDW: 12.3 % (ref 11.5–15.5)

## 2012-12-11 LAB — LIPASE, BLOOD: Lipase: 22 U/L (ref 11–59)

## 2012-12-11 MED ORDER — SODIUM CHLORIDE 0.9 % IV BOLUS (SEPSIS)
1000.0000 mL | Freq: Once | INTRAVENOUS | Status: AC
  Administered 2012-12-11: 1000 mL via INTRAVENOUS

## 2012-12-11 MED ORDER — SODIUM CHLORIDE 0.9 % IV SOLN
1000.0000 mL | INTRAVENOUS | Status: DC
  Administered 2012-12-11: 1000 mL via INTRAVENOUS

## 2012-12-11 NOTE — ED Notes (Signed)
MD at bedside. Dr. Allen at bedside.  

## 2012-12-11 NOTE — ED Notes (Signed)
Pt presents with c/o of stomach pain and bloating schedule for surgery by [facility] on Tuesday for exploratory Lap. Greater pain to the left lower stomach radiating into LLE.  Only ingesting Ensure.  LBM yesterday diarrhea. Pt presents with distending soft stomach tender upon exam.

## 2012-12-11 NOTE — ED Notes (Signed)
Pt lying supine in bed with head slightly elevated. Pt reports that her feet feel numb. No other complaints at this time. VSS. Urine specimen sent to lab.

## 2012-12-11 NOTE — ED Provider Notes (Signed)
History     [name]: [id number]  Arrival date & time 12/11/12  1452   First MD Initiated Contact with Patient 12/11/12 [phone]      Chief Complaint  Patient presents with  . Abdominal Pain  . Nausea  . Leg Pain    (Consider location/radiation/quality/duration/timing/severity/associated sxs/prior treatment) The history is provided by the patient.   patient here complaining of persistent abdominal pain x1-2 months and she is scheduled for exploratory laparotomy in 3 days. Here due 2 increased weakness from back of oral intake. Denies any fever or vomiting. No severe discomfort this time. Denies any urinary symptoms. Has been drinking Ensure without increase in her strength. He was informed by her physician to come here if she feels worse. No other treatments used prior to arrival. Nothing makes her symptoms better or worse.  Past Medical History  Diagnosis Date  . Bipolar disease, chronic     hosp Jan 13 [facility] depression borderline dependent traits   . Allergic rhinitis   . Headache   . S/P total hysterectomy     hx of endometriosis  . History of being hospitalized 11-10    [facility], psych  . IBS (irritable bowel syndrome)     GI evaluation for pain IBS dx, Dr. Loreta Ave  . Chest pain 11/29/12     x 30 min last week, pressure that resolved, then returned- no medical care sought    Past Surgical History  Procedure Laterality Date  . Cholecystectomy    . Total abdominal hysterectomy    . Oophorectomy       with removal ovarian remnant    Family History  Problem Relation Age of Onset  . Sleep apnea Father   . Migraines Sister     headaches  . Other Mother     MAC infection    History  Substance Use Topics  . Smoking status: Former Games developer  . Smokeless tobacco: Never Used  . Alcohol Use: 1.2 oz/week    2 Cans of beer per week    OB History   Grav Para Term Preterm Abortions TAB SAB Ect Mult Living                  Review of Systems  All other systems reviewed and are  negative.    Allergies  Morphine and related  Home Medications   Current Outpatient Rx  Name  Route  Sig  Dispense  Refill  . buPROPion (WELLBUTRIN XL) 150 MG 24 hr tablet   Oral   Take 150 mg by mouth daily with breakfast.          . diphenhydrAMINE (BENADRYL) 25 mg capsule   Oral   Take 25-50 mg by mouth at bedtime as needed for sleep.         [name] Kitchen estradiol (ESTRACE) 0.5 MG tablet   Oral   Take 0.5 mg by mouth at bedtime.         . hydrocodone-acetaminophen (LORCET-HD) 5-500 MG per capsule   Oral   Take 1 capsule by mouth every 6 (six) hours as needed for pain.         [name] Kitchen ibuprofen (ADVIL,MOTRIN) 200 MG tablet   Oral   Take 600-800 mg by mouth every 6 (six) hours as needed for pain.         [name] Kitchen lithium 300 MG tablet   Oral   Take 600 mg by mouth at bedtime.         [name] Kitchen LORazepam (ATIVAN)  1 MG tablet   Oral   Take 1 mg by mouth at bedtime.         . Multiple Vitamin (MULTIVITAMIN WITH MINERALS) TABS   Oral   Take 1 tablet by mouth daily.         . ondansetron (ZOFRAN) 4 MG tablet   Oral   Take 4 mg by mouth every 8 (eight) hours as needed for nausea.         . promethazine (PHENERGAN) 25 MG tablet   Oral   Take 25 mg by mouth every 6 (six) hours as needed for nausea.         . SUMAtriptan (IMITREX) 100 MG tablet   Oral   Take 1 tablet (100 mg total) by mouth every 2 (two) hours as needed.   10 tablet   0   . traMADol (ULTRAM) 50 MG tablet   Oral   Take 50 mg by mouth every 6 (six) hours as needed for pain.         [name] Kitchen zolpidem (AMBIEN) 10 MG tablet   Oral   Take 5 mg by mouth at bedtime as needed for sleep.            BP 118/84  Pulse 99  Temp(Src) 97.6 F (36.4 C) (Oral)  Resp 16  SpO2 100%  Physical Exam  Nursing note and vitals reviewed. Constitutional: She is oriented to person, place, and time. She appears well-developed and well-nourished.  Non-toxic appearance. No distress.  HENT:  Head: Normocephalic and atraumatic.   Eyes: Conjunctivae, EOM and lids are normal. Pupils are equal, round, and reactive to light.  Neck: Normal range of motion. Neck supple. No tracheal deviation present. No mass present.  Cardiovascular: Normal rate, regular rhythm and normal heart sounds.  Exam reveals no gallop.   No murmur heard. Pulmonary/Chest: Effort normal and breath sounds normal. No stridor. No respiratory distress. She has no decreased breath sounds. She has no wheezes. She has no rhonchi. She has no rales.  Abdominal: Soft. Normal appearance and bowel sounds are normal. She exhibits no distension. There is generalized tenderness. There is no rebound and no CVA tenderness.  Musculoskeletal: Normal range of motion. She exhibits no edema and no tenderness.  Neurological: She is alert and oriented to person, place, and time. She has normal strength. No cranial nerve deficit or sensory deficit. GCS eye subscore is 4. GCS verbal subscore is 5. GCS motor subscore is 6.  Skin: Skin is warm and dry. No abrasion and no rash noted.  Psychiatric: She has a normal mood and affect. Her speech is normal and behavior is normal.    ED Course  Procedures (including critical care time)  Labs Reviewed  CBC WITH DIFFERENTIAL  COMPREHENSIVE METABOLIC PANEL  AMYLASE  LIPASE, BLOOD  URINALYSIS, ROUTINE W REFLEX MICROSCOPIC   No results found.   No diagnosis found.    MDM  Patient given IV fluids here and feels better. She will followup with her Dr. next week for her exploratory laparotomy. She has no evidence of a surgical abdomen at this time        Toy Baker, MD 12/11/12 [phone]

## 2012-12-13 ENCOUNTER — Telehealth: Payer: Self-pay | Admitting: Internal Medicine

## 2012-12-13 NOTE — Telephone Encounter (Signed)
Please schedule an appt with the pt per [facility].  Thanks!!

## 2012-12-13 NOTE — Telephone Encounter (Signed)
lmom for pt to call back

## 2012-12-13 NOTE — Telephone Encounter (Signed)
Needs OV For me to examine breast  To decide on referral   BUT if  She had a breast exam by [facility]  Or surgeon and they noted  This was abnormal  Exam . they could do the referral instead

## 2012-12-13 NOTE — Telephone Encounter (Signed)
Do you want the patient to schedule an appt first?

## 2012-12-13 NOTE — Telephone Encounter (Addendum)
Pt is requesting a diagnostic mammogram for lumps in  left breast. Pt would like to have mammogram three week of may 2014

## 2012-12-14 ENCOUNTER — Encounter (HOSPITAL_COMMUNITY)
Admission: RE | Disposition: A | Discharge status: Home / Self Care | Payer: Self-pay | Source: Ambulatory Visit | Attending: General Surgery

## 2012-12-14 ENCOUNTER — Ambulatory Visit (HOSPITAL_COMMUNITY): Payer: Managed Care, Other (non HMO) | Admitting: Certified Registered Nurse Anesthetist

## 2012-12-14 ENCOUNTER — Encounter (HOSPITAL_COMMUNITY): Payer: Self-pay | Admitting: Certified Registered Nurse Anesthetist

## 2012-12-14 ENCOUNTER — Encounter (HOSPITAL_COMMUNITY): Payer: Self-pay | Admitting: *Deleted

## 2012-12-14 ENCOUNTER — Ambulatory Visit (HOSPITAL_COMMUNITY)
Admission: RE | Admit: 2012-12-14 | Discharge: 2012-12-14 | Disposition: A | Discharge status: Home / Self Care | Payer: Managed Care, Other (non HMO) | Source: Ambulatory Visit | Attending: General Surgery | Admitting: General Surgery

## 2012-12-14 DIAGNOSIS — R110 Nausea: Secondary | ICD-10-CM

## 2012-12-14 DIAGNOSIS — R633 Feeding difficulties, unspecified: Secondary | ICD-10-CM | POA: Insufficient documentation

## 2012-12-14 DIAGNOSIS — R630 Anorexia: Secondary | ICD-10-CM

## 2012-12-14 DIAGNOSIS — R634 Abnormal weight loss: Secondary | ICD-10-CM | POA: Insufficient documentation

## 2012-12-14 DIAGNOSIS — Z01810 Encounter for preprocedural cardiovascular examination: Secondary | ICD-10-CM | POA: Insufficient documentation

## 2012-12-14 DIAGNOSIS — K5900 Constipation, unspecified: Secondary | ICD-10-CM | POA: Insufficient documentation

## 2012-12-14 DIAGNOSIS — R109 Unspecified abdominal pain: Secondary | ICD-10-CM

## 2012-12-14 DIAGNOSIS — Z01812 Encounter for preprocedural laboratory examination: Secondary | ICD-10-CM | POA: Insufficient documentation

## 2012-12-14 HISTORY — PX: LAPAROSCOPY: SHX197

## 2012-12-14 LAB — GLUCOSE, CAPILLARY: Glucose-Capillary: 96 mg/dL (ref 70–99)

## 2012-12-14 SURGERY — LAPAROSCOPY, DIAGNOSTIC
Anesthesia: General | Site: Abdomen | Wound class: Clean

## 2012-12-14 MED ORDER — MIDAZOLAM HCL 5 MG/5ML IJ SOLN
INTRAMUSCULAR | Status: DC | PRN
  Administered 2012-12-14: 2 mg via INTRAVENOUS

## 2012-12-14 MED ORDER — NEOSTIGMINE METHYLSULFATE 1 MG/ML IJ SOLN
INTRAMUSCULAR | Status: DC | PRN
Start: 2012-12-14 — End: 2012-12-14
  Administered 2012-12-14: 5 mg via INTRAVENOUS

## 2012-12-14 MED ORDER — ACETAMINOPHEN 650 MG RE SUPP
650.0000 mg | RECTAL | Status: DC | PRN
  Filled 2012-12-14: qty 1

## 2012-12-14 MED ORDER — BUPIVACAINE-EPINEPHRINE PF 0.25-1:200000 % IJ SOLN
INTRAMUSCULAR | Status: AC
  Filled 2012-12-14: qty 30

## 2012-12-14 MED ORDER — GLYCOPYRROLATE 0.2 MG/ML IJ SOLN
INTRAMUSCULAR | Status: DC | PRN
  Administered 2012-12-14: 0.6 mg via INTRAVENOUS

## 2012-12-14 MED ORDER — DEXAMETHASONE SODIUM PHOSPHATE 4 MG/ML IJ SOLN
INTRAMUSCULAR | Status: DC | PRN
  Administered 2012-12-14: 4 mg via INTRAVENOUS

## 2012-12-14 MED ORDER — ONDANSETRON HCL 4 MG/2ML IJ SOLN
4.0000 mg | Freq: Four times a day (QID) | INTRAMUSCULAR | Status: DC | PRN
  Administered 2012-12-14: 4 mg via INTRAVENOUS
  Filled 2012-12-14: qty 2

## 2012-12-14 MED ORDER — ACETAMINOPHEN 325 MG PO TABS: 650.0000 mg | ORAL_TABLET | ORAL | Status: DC | PRN

## 2012-12-14 MED ORDER — CEFAZOLIN SODIUM-DEXTROSE 2-3 GM-% IV SOLR
INTRAVENOUS | Status: AC
  Filled 2012-12-14: qty 50

## 2012-12-14 MED ORDER — LACTATED RINGERS IV SOLN: INTRAVENOUS | Status: DC

## 2012-12-14 MED ORDER — PROPOFOL 10 MG/ML IV BOLUS
INTRAVENOUS | Status: DC | PRN
  Administered 2012-12-14: 150 mg via INTRAVENOUS

## 2012-12-14 MED ORDER — 0.9 % SODIUM CHLORIDE (POUR BTL) OPTIME
TOPICAL | Status: DC | PRN
  Administered 2012-12-14: 1000 mL

## 2012-12-14 MED ORDER — CEFAZOLIN SODIUM-DEXTROSE 2-3 GM-% IV SOLR
2.0000 g | INTRAVENOUS | Status: AC
  Administered 2012-12-14: 2 g via INTRAVENOUS

## 2012-12-14 MED ORDER — LIDOCAINE HCL 1 % IJ SOLN
INTRAMUSCULAR | Status: AC
  Filled 2012-12-14: qty 20

## 2012-12-14 MED ORDER — ROCURONIUM BROMIDE 100 MG/10ML IV SOLN
INTRAVENOUS | Status: DC | PRN
  Administered 2012-12-14: 30 mg via INTRAVENOUS

## 2012-12-14 MED ORDER — LACTATED RINGERS IV SOLN: INTRAVENOUS | Status: DC | PRN

## 2012-12-14 MED ORDER — HYDROMORPHONE HCL PF 1 MG/ML IJ SOLN: 0.2500 mg | INTRAMUSCULAR | Status: DC | PRN

## 2012-12-14 MED ORDER — HYDROCODONE-ACETAMINOPHEN 5-500 MG PO CAPS: 1.0000 | ORAL_CAPSULE | Freq: Four times a day (QID) | ORAL | Status: DC | PRN

## 2012-12-14 MED ORDER — LACTATED RINGERS IV SOLN
INTRAVENOUS | Status: DC | PRN
  Administered 2012-12-14: 12:00:00 via INTRAVENOUS

## 2012-12-14 MED ORDER — SODIUM CHLORIDE 0.9 % IJ SOLN: 3.0000 mL | INTRAMUSCULAR | Status: DC | PRN

## 2012-12-14 MED ORDER — SODIUM CHLORIDE 0.9 % IJ SOLN: 3.0000 mL | Freq: Two times a day (BID) | INTRAMUSCULAR | Status: DC

## 2012-12-14 MED ORDER — ONDANSETRON HCL 4 MG/2ML IJ SOLN
INTRAMUSCULAR | Status: DC | PRN
  Administered 2012-12-14: 4 mg via INTRAVENOUS

## 2012-12-14 MED ORDER — FENTANYL CITRATE 0.05 MG/ML IJ SOLN
INTRAMUSCULAR | Status: DC | PRN
  Administered 2012-12-14: 100 ug via INTRAVENOUS

## 2012-12-14 MED ORDER — SODIUM CHLORIDE 0.9 % IV SOLN: 250.0000 mL | INTRAVENOUS | Status: DC | PRN

## 2012-12-14 MED ORDER — ACETAMINOPHEN 10 MG/ML IV SOLN
INTRAVENOUS | Status: AC
  Filled 2012-12-14: qty 100

## 2012-12-14 MED ORDER — LACTATED RINGERS IV SOLN
INTRAVENOUS | Status: DC
  Administered 2012-12-14: 15:00:00 via INTRAVENOUS

## 2012-12-14 MED ORDER — PROMETHAZINE HCL 25 MG/ML IJ SOLN
INTRAMUSCULAR | Status: AC
  Filled 2012-12-14: qty 1

## 2012-12-14 MED ORDER — OXYCODONE HCL 5 MG PO TABS: 5.0000 mg | ORAL_TABLET | ORAL | Status: DC | PRN

## 2012-12-14 MED ORDER — ACETAMINOPHEN 10 MG/ML IV SOLN
INTRAVENOUS | Status: DC | PRN
  Administered 2012-12-14: 1000 mg via INTRAVENOUS

## 2012-12-14 MED ORDER — PROMETHAZINE HCL 25 MG/ML IJ SOLN
6.2500 mg | INTRAMUSCULAR | Status: DC | PRN
  Administered 2012-12-14: 12.5 mg via INTRAVENOUS

## 2012-12-14 MED ORDER — BUPIVACAINE-EPINEPHRINE 0.25% -1:200000 IJ SOLN
INTRAMUSCULAR | Status: DC | PRN
  Administered 2012-12-14: 11 mL

## 2012-12-14 MED ORDER — PROMETHAZINE HCL 25 MG/ML IJ SOLN: 12.5000 mg | INTRAMUSCULAR | Status: DC | PRN

## 2012-12-14 MED ORDER — LIDOCAINE HCL 1 % IJ SOLN
INTRAMUSCULAR | Status: AC
Start: 2012-12-14 — End: 2012-12-14
  Filled 2012-12-14: qty 20

## 2012-12-14 MED ORDER — SUCCINYLCHOLINE CHLORIDE 20 MG/ML IJ SOLN
INTRAMUSCULAR | Status: DC | PRN
  Administered 2012-12-14: 100 mg via INTRAVENOUS

## 2012-12-14 SURGICAL SUPPLY — 29 items
BENZOIN TINCTURE PRP APPL 2/3 (GAUZE/BANDAGES/DRESSINGS) IMPLANT
CANISTER SUCTION 2500CC (MISCELLANEOUS) ×2 IMPLANT
CANNULA ENDOPATH XCEL 11M (ENDOMECHANICALS) IMPLANT
CLOTH BEACON ORANGE TIMEOUT ST (SAFETY) ×2 IMPLANT
DECANTER SPIKE VIAL GLASS SM (MISCELLANEOUS) IMPLANT
DERMABOND ADVANCED (GAUZE/BANDAGES/DRESSINGS)
DERMABOND ADVANCED .7 DNX12 (GAUZE/BANDAGES/DRESSINGS) IMPLANT
DRAPE LAPAROSCOPIC ABDOMINAL (DRAPES) ×2 IMPLANT
DRAPE TOWEL STR TPT 18X26 WHT (DRAPES) ×4 IMPLANT
ELECT REM PT RETURN 9FT ADLT (ELECTROSURGICAL) ×2
ELECTRODE REM PT RTRN 9FT ADLT (ELECTROSURGICAL) ×1 IMPLANT
GLOVE BIO SURGEON STRL SZ 6 (GLOVE) ×2 IMPLANT
GLOVE INDICATOR 6.5 STRL GRN (GLOVE) ×4 IMPLANT
GOWN PREVENTION PLUS XXLARGE (GOWN DISPOSABLE) ×2 IMPLANT
GOWN STRL REIN 2XL LVL4 (GOWN DISPOSABLE) ×2 IMPLANT
HAND ACTIVATED (MISCELLANEOUS) IMPLANT
KIT BASIN OR (CUSTOM PROCEDURE TRAY) ×2 IMPLANT
SET IRRIG TUBING LAPAROSCOPIC (IRRIGATION / IRRIGATOR) IMPLANT
SOLUTION ANTI FOG 6CC (MISCELLANEOUS) ×2 IMPLANT
STRIP CLOSURE SKIN 1/2X4 (GAUZE/BANDAGES/DRESSINGS) IMPLANT
SUT VIC AB 4-0 PS2 27 (SUTURE) IMPLANT
TOWEL OR 17X26 10 PK STRL BLUE (TOWEL DISPOSABLE) ×6 IMPLANT
TRAY FOLEY CATH 14FRSI W/METER (CATHETERS) IMPLANT
TRAY LAP CHOLE (CUSTOM PROCEDURE TRAY) ×2 IMPLANT
TROCAR BLADELESS OPT 5 75 (ENDOMECHANICALS) IMPLANT
TROCAR XCEL BLUNT TIP 100MML (ENDOMECHANICALS) ×2 IMPLANT
TROCAR XCEL NON-BLD 11X100MML (ENDOMECHANICALS) IMPLANT
TUBING INSUFFLATION 10FT LAP (TUBING) ×2 IMPLANT
WATER STERILE IRR 1500ML POUR (IV SOLUTION) ×2 IMPLANT

## 2012-12-14 NOTE — H&P (View-Only) (Signed)
HISTORY: Patient is a 52 year old female who I saw a week ago for abdominal pain. She has food fear and weight loss. I ordered a CT angio to rule out vascular issues in the small bowel follow-through to assess for adhesions. These studies were normal. She continues to have increased difficulty eating. She feels full after several bites.  She is so weak she feels unable to work. She states that she almost passed out while shopping.    PERTINENT REVIEW OF SYSTEMS: Otherwise negative.     EXAM: Head: Normocephalic and atraumatic.  Eyes:  Conjunctivae are normal. Pupils are equal, round, and reactive to light. No scleral icterus.  Resp: No respiratory distress, normal effort. Abd:  Abdomen is soft, non distended and non tender. No masses are palpable.  There is no rebound and no guarding.  Neurological: Alert and oriented to person, place, and time. Coordination normal.  Skin: Skin is warm and dry. No rash noted. No diaphoretic. No erythema. No pallor.  Psychiatric: Normal mood and affect. Normal behavior. Judgment and thought content normal.      ASSESSMENT AND PLAN:   Abdominal  pain, other specified site All of our studies are negative for pathology. However, the patient continues to have severe abdominal pain and weight loss.  I discussed this patient with Dr. Ernestina Penna.  Given the severity of her symptoms, we will do a diagnostic laparoscopy. We would look for endometriosis, hernias, masses, adhesions, or other pathology. There are no other tests to order that would be revealing.  I have advised the patient of the risk of bleeding, infection, damage to adjacent structures, bowel injury, fistulas, and failure to find the cause of her pain.  She understands and wishes to proceed.      Maudry Diego, MD Surgical Oncology, General & Endocrine Surgery [facility]  Lorretta Harp, MD Panosh, Neta Mends, MD

## 2012-12-14 NOTE — Interval H&P Note (Signed)
History and Physical Interval Note:  12/14/2012 12:09 PM  [name]  has presented today for surgery, with the diagnosis of abdominal pain   The various methods of treatment have been discussed with the patient and family. After consideration of risks, benefits and other options for treatment, the patient has consented to  Procedure(s): LAPAROSCOPY DIAGNOSTIC (N/A) as a surgical intervention .  The patient's history has been reviewed, patient examined, no change in status, stable for surgery.  I have reviewed the patient's chart and labs.  Questions were answered to the patient's satisfaction.     [name]

## 2012-12-14 NOTE — Transfer of Care (Signed)
Immediate Anesthesia Transfer of Care Note  Patient: [name]  Procedure(s) Performed: Procedure(s): LAPAROSCOPY DIAGNOSTIC (N/A)  Patient Location: PACU  Anesthesia Type:General  Level of Consciousness: awake and alert   Airway & Oxygen Therapy: Patient Spontanous Breathing and Patient connected to face mask oxygen  Post-op Assessment: Report given to PACU RN and Post -op Vital signs reviewed and stable  Post vital signs: Reviewed and stable  Complications: No apparent anesthesia complications

## 2012-12-14 NOTE — Anesthesia Postprocedure Evaluation (Signed)
  Anesthesia Post-op Note  Patient: [name]  Procedure(s) Performed: Procedure(s) (LRB): LAPAROSCOPY DIAGNOSTIC (N/A)  Patient Location: PACU  Anesthesia Type: General  Level of Consciousness: awake and alert   Airway and Oxygen Therapy: Patient Spontanous Breathing  Post-op Pain: mild  Post-op Assessment: Post-op Vital signs reviewed, Patient's Cardiovascular Status Stable, Respiratory Function Stable, Patent Airway and No signs of Nausea or vomiting  Last Vitals:  Filed Vitals:   12/14/12 1330  BP: 120/66  Pulse: 85  Temp:   Resp: 11    Post-op Vital Signs: stable   Complications: No apparent anesthesia complications

## 2012-12-14 NOTE — Anesthesia Preprocedure Evaluation (Addendum)
Anesthesia Evaluation  Patient identified by MRN, date of birth, ID band Patient awake    Reviewed: Allergy & Precautions, H&P , NPO status , Patient's Chart, lab work & pertinent test results  Airway Mallampati: II TM Distance: >3 FB Neck ROM: full    Dental no notable dental hx. (+) Teeth Intact and Dental Advisory Given   Pulmonary neg pulmonary ROS, shortness of breath and with exertion,  breath sounds clear to auscultation  Pulmonary exam normal       Cardiovascular Exercise Tolerance: Good negative cardio ROS  Rhythm:regular Rate:Normal     Neuro/Psych Bipolar Disorder negative neurological ROS  negative psych ROS   GI/Hepatic negative GI ROS, Neg liver ROS,   Endo/Other  negative endocrine ROS  Renal/GU negative Renal ROS  negative genitourinary   Musculoskeletal   Abdominal Normal abdominal exam  (+)   Peds  Hematology negative hematology ROS (+)   Anesthesia Other Findings   Reproductive/Obstetrics negative OB ROS                          Anesthesia Physical Anesthesia Plan  ASA: II  Anesthesia Plan: General   Post-op Pain Management:    Induction: Intravenous  Airway Management Planned: Oral ETT  Additional Equipment:   Intra-op Plan:   Post-operative Plan: Extubation in OR  Informed Consent: I have reviewed the patients History and Physical, chart, labs and discussed the procedure including the risks, benefits and alternatives for the proposed anesthesia with the patient or authorized representative who has indicated his/her understanding and acceptance.   Dental Advisory Given  Plan Discussed with: CRNA and Surgeon  Anesthesia Plan Comments:         Anesthesia Quick Evaluation

## 2012-12-14 NOTE — Op Note (Signed)
PRE-OPERATIVE DIAGNOSIS: abdominal pain, nausea, weight loss, anorexia  POST-OPERATIVE DIAGNOSIS:  Same  PROCEDURE:  Procedure(s): Diagnostic laparoscopy  SURGEON:  Surgeon(s): Almond Lint, MD  ASSISTANT:  Marcille Blanco, MD  CONSULTANT:  Noland Fordyce, MD  ANESTHESIA:   local and general  DRAINS: none   LOCAL MEDICATIONS USED:  MARCAINE    and LIDOCAINE   SPECIMEN:  No Specimen  DISPOSITION OF SPECIMEN:  N/A  COUNTS:  YES  DICTATION: .Dragon Dictation  PLAN OF CARE: Discharge to home after PACU  FINDINGS:  Constipation.    PATIENT DISPOSITION:  PACU - hemodynamically stable.  PROCEDURE:  Pt was identified in the holding area and placed supine on the OR table.  General anesthesia was induced.  She was placed into the low lithotomy position.  Her abdomen and perineum was prepped and draped in sterile fashion.  Time out was performed according to the surgical safety checklist.  When all was correct, we continued.  The patient was placed into reverse trendelenburg position and rotated to the right.  The 5 mm Optiview was placed under direct visualization.  The abdomen was insufflated with CO2 to a pressure of 15 mm Hg.  She was then placed into trendelenburg position.  Two additional 5 mm trocars were placed in the left abdomen.  The abdominal cavity was examined carefully.  She had no evidence of endometriosis, or adhesions.  Her bowels were freely mobile.  She did appear to have significant soft stool all the way to the cecum.  There was no evidence of inflammation of the appendix or the colon.  Her small bowel, stomach, and liver appeared normal.  The pelvis had no evidence of adhesions.  Dr. Ernestina Penna was present to examine the pelvis and sidewalls and did not see anything concerning.  The patient had no gallbladder, and the RUQ did not demonstrate pathology.  The omentum appeared normal and mobile.  No masses were seen.    The capnoperitoneum was released as the left trocars  were removed.  There was no evidence of bleeding from the abdominal wall.  The skin was closed with 4-0 monocryl.  The wounds were cleaned, dried, and dressed with dermabond.    Needle, sponge, and instrument counts were correct times 2.  The patient was awakened from anesthesia and taken to pacu in stable condition.

## 2012-12-15 ENCOUNTER — Encounter (HOSPITAL_COMMUNITY): Payer: Self-pay | Admitting: General Surgery

## 2012-12-15 ENCOUNTER — Telehealth (INDEPENDENT_AMBULATORY_CARE_PROVIDER_SITE_OTHER): Payer: Self-pay | Admitting: *Deleted

## 2012-12-15 NOTE — Telephone Encounter (Signed)
Patient called today asking about results from her surgery and if anything was found.  Patient also states no bowel movement since Monday.  Patient encouraged to start on Milk of Mag to produce a bowel movement.  Patient also asking about whether she needs to go back to make appt with Dr. Loreta Ave.

## 2012-12-15 NOTE — Telephone Encounter (Signed)
Pt is sch for 12-20-12

## 2012-12-15 NOTE — Telephone Encounter (Signed)
Yes, had long talk with her mom.  Did not see any adhesions or any endometriosis.  No inflammation of any of the bowel seen.  Appendix completely normal.    She will need to go back to see Dr. Loreta Ave. tx FB

## 2012-12-15 NOTE — Telephone Encounter (Signed)
Spoke to patient regarding the below message from Dr. Donell Beers.  Patient is agreeable and states understanding at this time.

## 2012-12-16 NOTE — Telephone Encounter (Signed)
Patient called back again wanting results of surgery. She states Dr Loreta Ave is referring her to [facility] and she wants to know what happened during surgery and why she would need to be seen at [facility]. I advised per the note below that no problems were found at the time of surgery. If she has questions re: referral to [facility] I encouraged her to call Dr Loreta Ave to discuss. She will call back with any additional questions.

## 2012-12-20 ENCOUNTER — Ambulatory Visit (INDEPENDENT_AMBULATORY_CARE_PROVIDER_SITE_OTHER): Payer: Managed Care, Other (non HMO) | Admitting: Internal Medicine

## 2012-12-20 ENCOUNTER — Encounter: Payer: Self-pay | Admitting: Internal Medicine

## 2012-12-20 VITALS — BP 122/80 | HR 91 | Temp 98.0°F | Wt 128.0 lb

## 2012-12-20 DIAGNOSIS — N6320 Unspecified lump in the left breast, unspecified quadrant: Secondary | ICD-10-CM

## 2012-12-20 DIAGNOSIS — N63 Unspecified lump in unspecified breast: Secondary | ICD-10-CM

## 2012-12-20 DIAGNOSIS — N6029 Fibroadenosis of unspecified breast: Secondary | ICD-10-CM

## 2012-12-20 DIAGNOSIS — R109 Unspecified abdominal pain: Secondary | ICD-10-CM

## 2012-12-20 DIAGNOSIS — F411 Generalized anxiety disorder: Secondary | ICD-10-CM | POA: Insufficient documentation

## 2012-12-20 DIAGNOSIS — N6022 Fibroadenosis of left breast: Secondary | ICD-10-CM

## 2012-12-20 HISTORY — DX: Unspecified lump in the left breast, unspecified quadrant: N63.20

## 2012-12-20 NOTE — Patient Instructions (Addendum)
Your left  Breast is lumpy   We can refer for diagnosis and  [place]   For this.    Breast center. You will be contact about this appt.  I agree with  Firefighter specialists at [facility].     Small amount of nutrition frequently. Suggest have that clinic  Decode on nutrition dietary consults.    Will check record for reports etc .

## 2012-12-20 NOTE — Progress Notes (Signed)
Chief Complaint  Patient presents with  . Breast Mass    Left side on [name] lower portion of [name] breast.    HPI: Patient comes in today for new problem evaluation. See phone note.  [name] is seen a number of physicians since I last saw her under evaluation for weight loss abdominal pain nausea. She just had a diagnostic laparoscopy done last week. However she is noted some lumpiness in her left breast about 6:00 somewhat tender and asks about getting a diagnostic mammogram referral.  There is no discharge or fever. Her last mammogram was over 2 years ago.  Updating her history to me about her laparoscopic exam she states that there were no abnormalities except for colon was full of stool. Dr. Loreta [name] has referred her to [name] motility specialists at [name] however she has many concerns about what she should do until then feared that she could shrivel away and become so malnourished that it would be very unhealthy has nutritional questions asks if she should go back to [name] neurologist because gastroparesis can be a neurologic problem.  She has no active vomiting but does have nausea infrequent stools only took one narcotic after her abdominal surgery  ROS: See pertinent positives and negatives per HPI.  Past Medical History  Diagnosis Date  . Bipolar disease, chronic     hosp Jan 13 [name]   . Allergic rhinitis   . Headache   . S/P total hysterectomy     hx of endometriosis  . History of being hospitalized 11-10    [name], psych  . IBS (irritable bowel syndrome)     GI evaluation for pain IBS dx, Dr. Loreta [name]  . Chest pain 11/29/12     x 30 min last week, pressure that resolved, then returned- no medical care sought    Family History  Problem Relation Age of Onset  . Sleep apnea Father   . Migraines Sister     headaches  . Other Mother     MAC infection    History   Social History  . Marital Status: Single    Spouse Name: N/A   Number of Children: N/A  . Years of Education: N/A   Social History Main Topics  . Smoking status: Former Games developer  . Smokeless tobacco: Never Used  . Alcohol Use: 1.2 oz/week    2 Cans of beer per week  . Drug Use: No  . Sexually Active: None   Other Topics Concern  . 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    no tobacco    Outpatient Encounter Prescriptions as of 12/20/2012  Medication Sig Dispense Refill  . hydrocodone-acetaminophen (LORCET-HD) 5-500 MG per capsule Take 1 capsule by mouth every 6 (six) hours as needed for pain.  30 capsule  0  . lithium 300 MG tablet Take 600 mg by mouth at bedtime.      . ondansetron (ZOFRAN) 4 MG tablet Take 4 mg by mouth every 8 (eight) hours as needed for nausea.      . promethazine (PHENERGAN) 25 MG tablet Take 25 mg by mouth every 6 (six) hours as needed for nausea.      . SUMAtriptan (IMITREX) 100 MG tablet Take 1 tablet (100 mg total) by mouth every 2 (two) hours  as needed.  10 tablet  0  . zolpidem (AMBIEN) 10 MG tablet Take 5 mg by mouth at bedtime as needed for sleep.       . [DISCONTINUED] buPROPion (WELLBUTRIN XL) 150 MG 24 hr tablet Take 150 mg by mouth daily with breakfast.       . [DISCONTINUED] estradiol (ESTRACE) 0.5 MG tablet Take 0.5 mg by mouth at bedtime.      . [DISCONTINUED] LORazepam (ATIVAN) 1 MG tablet Take 0.5 mg by mouth at bedtime.       . [DISCONTINUED] traMADol (ULTRAM) 50 MG tablet Take 50 mg by mouth every 6 (six) hours as needed for pain.       No facility-administered encounter medications on file as of 12/20/2012.    EXAM:  BP 122/80  Pulse 91  Temp(Src) 98 F (36.7 C) (Oral)  Wt 128 lb (58.06 kg)  BMI 25 kg/m2  SpO2 98%  Body mass index is 25 kg/(m^2).  GENERAL: vitals reviewed and listed above, alert, oriented, appears well hydrated and in no  acute distress  HEENT: atraumatic, conjunctiva  clear, no obvious abnormalities on inspection of external nose and ears  NECK: no obvious masses on inspection palpation   Breasts left breast is lumpy her than [name] right and there is possibly a nodule in [name] left 6:00 1 cm but it isn't really firm versus cystic. Axilla is clear no indentations or discharges. Abdomen healing laparoscopy scars no guarding rebound CV: HRRR, no clubbing cyanosis or  peripheral edema nl cap refill   MS: moves all extremities without noticeable focal  abnormality   Psych oriented good eye contact  Wt Readings from Last 3 Encounters:  12/20/12 128 lb (58.06 kg)  12/06/12 131 lb (59.421 kg)  11/19/12 129 lb (58.514 kg)    ASSESSMENT AND PLAN:  Discussed [name] following assessment and plan:  Lumpy breasts, left - Plan: MM Digital Diagnostic Unilat L, US Breast Left  Lump of breast, left - about 6 oclock  - Plan: MM Digital Diagnostic Unilat L, US Breast Left  Abdominal  pain, other specified site - fullevaluation  gi  and dx lap  to  get nmotility eval [name]   Anxiety state, unspecified - catastrophic thinking counseld to stay focused on  one  evauation an follow through to help her with current situation  Tends to get many somatic  Co and worried  And encouraged to not go to multiple providers and  Reactive testing  -Patient advised to return or notify health care team  if symptoms worsen or persist or new concerns arise.  Patient Instructions  Your left  Breast is lumpy   We can refer for diagnosis and  [place]   For this.    Breast [name]. You will be contact about this appt.  I agree with  Firefighter specialists at [name].     Small amount of nutrition frequently. Suggest have that clinic  Decode on nutrition dietary consults.    Will check record for reports etc .      Neta Mends. Kiela Shisler M.D.

## 2012-12-22 NOTE — Consult Note (Signed)
Intra-op consult (late entry of note):   H&P reviewed. Pt previously seen by me in office. Persistent LLQ pain, recent increase in nausea and anorexia and documented wt loss. Pt w/ long standing h/o pelvic pain, she is s/p total hysterectomy and BSO (in several different operations) and an additional surgery by the pelvic pain clinic in [facility] for removal of ovarian remnant. At no time during her prior surgeries have there been concerns for endometriosis. She is now surgically menopausal and on HRT which we are titrating down her HRT dose. Pt has been evaluated by GI and despite a known h/o slow bowel transit time and diverticulosis, GI did not think this was causing her current sx. Given the continued pain and wt loss, pt presents for laprascopic eval by Dr. Donell Beers and I have been asked to evaluate her pelvis intra-op.  See Dr. Arita Miss OR note for full description of procedure.  Gyn intraop findings include absence of all gyn organs, no uterus, ovaries, cvx or fallopian tubes. No evidence of endometriosis, no significant scarring noted in the pelvis, though there was a very small amount of peritoneal scarring in the L pelvic side wall over the ureter, this was evidence of the prior surgery to remove the ovarian remnant.  No bowel adhesions notes. Increased fecal burden noted.    15 min spent on the floor with the pt observing the surgery and commenting on pelvic findings.  Uriel Horkey A. 12/22/2012 4:00 PM

## 2012-12-24 ENCOUNTER — Telehealth (INDEPENDENT_AMBULATORY_CARE_PROVIDER_SITE_OTHER): Payer: Self-pay | Admitting: General Surgery

## 2012-12-24 NOTE — Telephone Encounter (Signed)
Discussed surgery with patient.  She is eating a bit better.   Discussed constipation/motility issues.  Pt waiting on input from [facility].

## 2012-12-24 NOTE — Telephone Encounter (Signed)
Message copied by Almond Lint on Fri Dec 24, 2012  1:23 PM ------      Message from: Samantha Crimes      Created: Wed Dec 22, 2012 12:17 PM      Regarding: FW: PT WANTS TO TAK W/ DR Donell Beers      Contact: [phone]                   ----- Message -----         From: Samantha Crimes         Sent: 12/21/2012  11:04 AM           To: Milas Hock, CMA      Subject: PT WANTS TO TAK W/ DR Gerrianne Scale      Ms Antwine would like to speak with Dr Donell Beers about her surgery. She also wanted Dr Donell Beers to know that Dr Loreta Ave is not going to see her but referring her to [facility].            Thanks      French Ana ------

## 2012-12-30 ENCOUNTER — Telehealth (INDEPENDENT_AMBULATORY_CARE_PROVIDER_SITE_OTHER): Payer: Self-pay | Admitting: *Deleted

## 2012-12-30 NOTE — Telephone Encounter (Signed)
Patient called this morning with multiple concerns.  Patient states she has been told by Dr. Kenna Gilbert office that they are refusing to see her due to Dr. Loreta Ave has not spoken to Dr. Donell Beers.  However patient states she has an appt to see her this afternoon so the patient is not sure if she will actually see her or not.  Patient states she is very frustrated that there are still no answers regarding what is going on with her health.  Patient also concerned that she was told by her job that she has until tomorrow to either resign or be fired due to the amount of time she has missed at work.  Patient states she was told she was ineligible for FMLA because she has been at her job less than 1 year.  Patient states she took herself out of work following her first appt with Dr. Donell Beers on March 24th due to she was so weak.  Patient states she was not told by a doctor to be out of work but she felt she couldn't work so she just stayed out until then.  This RN is not exactly sure what the patient is wanting at this time because she has so many concerns and this RN is unsure how to assist with any of them.  This RN did speak with Dr. Loreta Ave recently and the OP note and last office visit note was faxed to her at her office.  Patient just states at the end of the conversation she just wanted Byerly MD to be aware of everything she is going through.

## 2012-12-30 NOTE — Telephone Encounter (Signed)
I spoke to Dr. Loreta Ave yesterday.

## 2012-12-31 ENCOUNTER — Ambulatory Visit
Admission: RE | Admit: 2012-12-31 | Discharge: 2012-12-31 | Disposition: A | Discharge status: Home / Self Care | Payer: Managed Care, Other (non HMO) | Source: Ambulatory Visit | Attending: Internal Medicine | Admitting: Internal Medicine

## 2012-12-31 ENCOUNTER — Other Ambulatory Visit: Payer: Self-pay | Admitting: Gastroenterology

## 2012-12-31 DIAGNOSIS — R141 Gas pain: Secondary | ICD-10-CM

## 2012-12-31 DIAGNOSIS — N6320 Unspecified lump in the left breast, unspecified quadrant: Secondary | ICD-10-CM

## 2012-12-31 DIAGNOSIS — N6022 Fibroadenosis of left breast: Secondary | ICD-10-CM

## 2012-12-31 DIAGNOSIS — R110 Nausea: Secondary | ICD-10-CM

## 2012-12-31 DIAGNOSIS — R142 Eructation: Secondary | ICD-10-CM

## 2013-01-03 ENCOUNTER — Telehealth: Payer: Self-pay | Admitting: Internal Medicine

## 2013-01-03 NOTE — Telephone Encounter (Signed)
Patient Information:  Caller Name: Rain Wilhide  Phone: [phone]  Patient: [name]  Gender: Female  DOB: 06-06-1961  Age: 52 Years  PCP: Berniece Andreas (Family Practice)  Pregnant: No  Office Follow Up:  Does the office need to follow up with this patient?: Yes  Instructions For The Office: Patient requests appointment with Dr. Fabian Sharp 5/20.   Please review notes.  Thank you.   Symptoms  Reason For Call & Symptoms: Patient calling. States she was driving and felt confused (in past couple hours) "like I wouldn't realize what was going on - like there was a car coming and  I might not realize it", face feels numb (intermittent x 2 weeks).  Legs below knee and feet are numb "the last few days".  Other emergent symptoms ruled out. Advised ED for evaluation. Caller statess she has had multiple evaluations recently and she does not plan to follow instructions.  She states she would like to come to office to have A1C checked and would like to see Dr. Fabian Sharp about her concerns.  Reviewed Health History In EMR: Yes  Reviewed Medications In EMR: Yes  Reviewed Allergies In EMR: Yes  Reviewed Surgeries / Procedures: Yes  Date of Onset of Symptoms: 01/03/2013 OB / GYN:  LMP: Unknown  Guideline(s) Used:  Confusion - Delirium  Disposition Per Guideline:   Go to ED Now (or to Office with PCP Approval)  Reason For Disposition Reached:   Patient sounds very sick or weak to the triager  Advice Given:  Call Back If:  You have more questions.  You (i.e., patient, family member) become worse.  Patient Refused Recommendation:  Patient Will Follow Up With Office Later  Declined to go to ED as advised for new onset of feeling confused.  States plan to follow up with office on 01/04/13 unless symptoms get worse in which case she may go for evaluation.

## 2013-01-03 NOTE — Telephone Encounter (Signed)
Attempted to contact patient as requested regarding labs.  Left voice mail to call office.

## 2013-01-04 NOTE — Telephone Encounter (Signed)
Patient has had multiple evaluations for various symptoms.  It is possible the symptoms are from medications were stress anxiety. If she feels that these are new  new and emergent symptoms  she should go to the emergency room.    If not: we can get  Bmp and hg a1c tests and then OV  Next week  Or  This Friday    Dx :numbness

## 2013-01-04 NOTE — Telephone Encounter (Signed)
Left message on home/cell for the pt to return my call. 

## 2013-01-04 NOTE — Telephone Encounter (Signed)
When would you like to see the patient?

## 2013-01-05 ENCOUNTER — Other Ambulatory Visit: Payer: Self-pay | Admitting: Gastroenterology

## 2013-01-05 ENCOUNTER — Other Ambulatory Visit: Payer: Self-pay | Admitting: Family Medicine

## 2013-01-05 ENCOUNTER — Other Ambulatory Visit (INDEPENDENT_AMBULATORY_CARE_PROVIDER_SITE_OTHER): Payer: Managed Care, Other (non HMO)

## 2013-01-05 ENCOUNTER — Encounter (HOSPITAL_COMMUNITY)
Admission: RE | Admit: 2013-01-05 | Discharge: 2013-01-05 | Disposition: A | Discharge status: Home / Self Care | Payer: Managed Care, Other (non HMO) | Source: Ambulatory Visit | Attending: Gastroenterology | Admitting: Gastroenterology

## 2013-01-05 DIAGNOSIS — R634 Abnormal weight loss: Secondary | ICD-10-CM | POA: Insufficient documentation

## 2013-01-05 DIAGNOSIS — R1013 Epigastric pain: Secondary | ICD-10-CM

## 2013-01-05 DIAGNOSIS — R209 Unspecified disturbances of skin sensation: Secondary | ICD-10-CM

## 2013-01-05 DIAGNOSIS — R110 Nausea: Secondary | ICD-10-CM | POA: Insufficient documentation

## 2013-01-05 DIAGNOSIS — R200 Anesthesia of skin: Secondary | ICD-10-CM

## 2013-01-05 DIAGNOSIS — R142 Eructation: Secondary | ICD-10-CM | POA: Insufficient documentation

## 2013-01-05 DIAGNOSIS — Z79899 Other long term (current) drug therapy: Secondary | ICD-10-CM

## 2013-01-05 DIAGNOSIS — K3189 Other diseases of stomach and duodenum: Secondary | ICD-10-CM | POA: Insufficient documentation

## 2013-01-05 DIAGNOSIS — R6881 Early satiety: Secondary | ICD-10-CM | POA: Insufficient documentation

## 2013-01-05 DIAGNOSIS — R141 Gas pain: Secondary | ICD-10-CM | POA: Insufficient documentation

## 2013-01-05 LAB — BASIC METABOLIC PANEL
Chloride: 106 mEq/L (ref 96–112)
GFR: 90.76 mL/min (ref >60.00)
Potassium: 3.6 mEq/L (ref 3.5–5.1)
Sodium: 140 mEq/L (ref 135–145)

## 2013-01-05 LAB — GLUCOSE, CAPILLARY: Glucose-Capillary: 94 mg/dL (ref 70–99)

## 2013-01-05 LAB — HEMOGLOBIN A1C: Hgb A1c MFr Bld: 5.1 % (ref 4.6–6.5)

## 2013-01-05 MED ORDER — TECHNETIUM TC 99M SULFUR COLLOID
2.0000 | Freq: Once | INTRAVENOUS | Status: AC | PRN
  Administered 2013-01-05: 2 via INTRAVENOUS

## 2013-01-05 NOTE — Telephone Encounter (Signed)
Patient will come in on 01/05/13 for lab work and an appt on 01/07/13.

## 2013-01-05 NOTE — Telephone Encounter (Signed)
Per WP.

## 2013-01-07 ENCOUNTER — Ambulatory Visit (INDEPENDENT_AMBULATORY_CARE_PROVIDER_SITE_OTHER): Payer: Managed Care, Other (non HMO) | Admitting: Internal Medicine

## 2013-01-07 ENCOUNTER — Encounter: Payer: Self-pay | Admitting: Internal Medicine

## 2013-01-07 VITALS — BP 118/72 | HR 85 | Temp 98.2°F | Wt 134.0 lb

## 2013-01-07 DIAGNOSIS — R202 Paresthesia of skin: Secondary | ICD-10-CM

## 2013-01-07 DIAGNOSIS — F319 Bipolar disorder, unspecified: Secondary | ICD-10-CM

## 2013-01-07 DIAGNOSIS — Z79899 Other long term (current) drug therapy: Secondary | ICD-10-CM

## 2013-01-07 DIAGNOSIS — T50905A Adverse effect of unspecified drugs, medicaments and biological substances, initial encounter: Secondary | ICD-10-CM

## 2013-01-07 DIAGNOSIS — R739 Hyperglycemia, unspecified: Secondary | ICD-10-CM

## 2013-01-07 DIAGNOSIS — R7309 Other abnormal glucose: Secondary | ICD-10-CM

## 2013-01-07 DIAGNOSIS — R209 Unspecified disturbances of skin sensation: Secondary | ICD-10-CM

## 2013-01-07 DIAGNOSIS — T887XXA Unspecified adverse effect of drug or medicament, initial encounter: Secondary | ICD-10-CM

## 2013-01-07 NOTE — Patient Instructions (Signed)
Your labs do not show diabetes. It is possible  To get    Glucose intolerance after a sugar or carb meal and make you feel shaky   But  At this time i dont think a  Glucose toleracne test should be done yet.  Talk  with dr James Ivanoff  . About the lithium an drug interaction with reglan.  You certainly could have somatoform disorder.  But I agree    That   Motility problem could be an issue based on your history.      Alternative.   To reglan ? dont   Know if helps.      Will try and contact   Dr Trilby Drummer office  About referral to [facility].

## 2013-01-07 NOTE — Progress Notes (Addendum)
Chief Complaint  Patient presents with  . Numbness    bloating se of meds     HPI: Patient comes in today for follow up of  multiple medical problems.  Requested after CAN  Call about parethesia   And conern about diabetes ? Other . She has had a history of an elevated blood sugar one time.  Today she was worried about tingling off and on and make sure she doesn't have diabetes she is very frustrated with his situation and her abdomen. Feels bloated and has gained weight. She had a gastric emptying study recently but was on Reglan and apparently was normal. However she had a severe reaction when taking the Reglan and became very manic for almost 2 days. This is why she asked for her lithium level she is seeing a practitioner at Dr. Caprice Renshaw office but is supposed to see him soon. She is going to call for this. She has many concerns that something is wrong. She has a followup with the surgeon who did her low laparoscopy in June. GI : sp lap  With no dx for abd pain and poss gatroparesis .    She thought she was supposed to be referred to GI at [facility] but was told that she needed the studies to be done here. She think she needs a second opinion on her GI distress because she can't really take the Reglan. The Reglan did help her gave her some diarrhea but when she's not on a feels bloated and constipated.  She has stopped eating normal meals sipping on Gatorade a lot.  ROS: See pertinent positives and negatives per HPI.  Past Medical History  Diagnosis Date  . Bipolar disease, chronic     hosp Jan 13 [facility] depression borderline dependent traits   . Allergic rhinitis   . Headache(784.0)   . S/P total hysterectomy     hx of endometriosis  . History of being hospitalized 11-10    [facility], psych  . IBS (irritable bowel syndrome)     GI evaluation for pain IBS dx, Dr. Loreta Ave  . Chest pain 11/29/12     x 30 min last week, pressure that resolved, then returned- no medical care sought    Family  History  Problem Relation Age of Onset  . Sleep apnea Father   . Migraines Sister     headaches  . Other Mother     MAC infection    History   Social History  . Marital Status: Single    Spouse Name: N/A    Number of Children: N/A  . Years of Education: N/A   Social History Main Topics  . Smoking status: Former Games developer  . Smokeless tobacco: Never Used  . Alcohol Use: 1.2 oz/week    2 Cans of beer per week  . Drug Use: No  . Sexually Active: None   Other Topics Concern  . 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    no tobacco    Outpatient Encounter Prescriptions as of 01/07/2013  Medication Sig Dispense Refill  . hydrocodone-acetaminophen (LORCET-HD) 5-500 MG per capsule Take 1 capsule by mouth every 6 (six) hours as needed for pain.  30 capsule  0  . lithium 300 MG tablet Take 600 mg by mouth at bedtime.      [name] Kitchen  ondansetron (ZOFRAN) 4 MG tablet Take 4 mg by mouth every 8 (eight) hours as needed for nausea.      . promethazine (PHENERGAN) 25 MG tablet Take 25 mg by mouth every 6 (six) hours as needed for nausea.      . SUMAtriptan (IMITREX) 100 MG tablet Take 1 tablet (100 mg total) by mouth every 2 (two) hours as needed.  10 tablet  0  . zolpidem (AMBIEN) 10 MG tablet Take 5 mg by mouth at bedtime as needed for sleep.        No facility-administered encounter medications on file as of 01/07/2013.    EXAM:  BP 118/72  Pulse 85  Temp(Src) 98.2 F (36.8 C)  Wt 134 lb (60.782 kg)  BMI 26.17 kg/m2  SpO2 98%  Body mass index is 26.17 kg/(m^2).  GENERAL: vitals reviewed and listed above, alert, oriented, appears well hydrated and in no acute distress  HEENT: atraumatic, conjunctiva  clear, no obvious abnormalities on inspection of external nose and ears  NECK: no obvious masses on inspection palpation  LUNGS:  clear to auscultation bilaterally, no wheezes, rales or rhonchi, good air movement  CV: HRRR, no clubbing cyanosis or  peripheral edema nl cap refill  Abdomen soft without organomegaly guarding or rebound bowel sounds are present well-healed scars MS: moves all extremities without noticeable focal  abnormality  Oriented alert cognitively intact speech is normal. There is no tremor gait is within normal limits full neurologic not done today  ASSESSMENT AND PLAN:  Discussed the following assessment and plan:  Tingling  Lithium use  Hyperglycemia  DISORDER, BIPOLAR NOS  Medication side effect, initial encounter - Reglan caused mania She certainly could have somatizations disorder has multiple complaints many times may have a basis in disease but has a hard time letting it go and not doing excessive work ups. She is so concerned that her gastric emptying study was done on the Reglan and therefore was normal. And that although the Reglan work she cannot take it because it gave her a side effect that she felt made her manic. Discussed that she may have had elevated blood sugars after she eats depending on what she eats but would not do glucose tolerance test at this time but would have her alter her diet. She did benefit from a nutritionist dietitian who deals with blood sugar and gastric emptying motility problems. But would not want to refer at this time without a team agreement.  I would go through Dr. Loreta Ave to decide the next step as opposed to multiple referrals to different places at this time it might be reasonable to get another opinion at [facility] GI but I told her that there may not be an answer that is acceptable to her. It appears she doesn't tolerate various symptom complexes and worries that it could be something serious.  -Patient advised to return or notify health care team  if symptoms worsen or persist or new concerns arise.  Patient Instructions  Your labs do not show  diabetes. It is possible  To get    Glucose intolerance after a sugar or carb meal and make you feel shaky   But  At this time i dont think a  Glucose toleracne test should be done yet.  Talk  with dr James Ivanoff  . About the lithium an drug interaction with reglan.  You certainly could have somatoform disorder.  But I agree    That   Motility problem could be an  issue based on your history.      Alternative.   To reglan ? dont   Know if helps.      Will try and contact   Dr Trilby Drummer office  About referral to [facility].       Neta Mends. Panosh M.D. Prolonged visit  Discussion    Call into dr Trilby Drummer office .   Disc yesterday  With dr Pleas Koch that she was on reglan at time of scan as she was told not to take it then . Doesn't think that dr Lutricia Horsfall will accept there referral  Because of nl srudy and no other findings  On extensive work up. agreecd that Fu with behavioral health extremely important and this is adding to many of her sx.   Their office will contact patient about scan .  Dr Loreta Ave told of se of the reglan.   There is a dieteician that she would recommend and i agree if patient is willing.

## 2013-01-11 ENCOUNTER — Telehealth (INDEPENDENT_AMBULATORY_CARE_PROVIDER_SITE_OTHER): Payer: Self-pay | Admitting: General Surgery

## 2013-01-11 NOTE — Telephone Encounter (Signed)
Patient calling to move her appt sooner. She has ongoing problems with abdominal pain and swelling. She states it has gotten worse but it has been going on awhile. I advised Dr Arita Miss first office is on Friday and she has an appt scheduled for Monday. I encouraged her to keep appt she has on Monday and she is okay with this. She wanted to let Dr Donell Beers know that she has not yet been referred to [facility] by Dr Kenna Gilbert office. She will keep appt.

## 2013-01-17 ENCOUNTER — Ambulatory Visit (INDEPENDENT_AMBULATORY_CARE_PROVIDER_SITE_OTHER): Payer: Managed Care, Other (non HMO) | Admitting: General Surgery

## 2013-01-17 ENCOUNTER — Encounter (INDEPENDENT_AMBULATORY_CARE_PROVIDER_SITE_OTHER): Payer: Self-pay | Admitting: General Surgery

## 2013-01-17 VITALS — BP 118/72 | HR 94 | Temp 97.4°F | Resp 17 | Ht 60.0 in | Wt 131.2 lb

## 2013-01-17 DIAGNOSIS — R109 Unspecified abdominal pain: Secondary | ICD-10-CM

## 2013-01-17 NOTE — Assessment & Plan Note (Signed)
Will refer to [facility] GI functional/motility disorder group for evaluation.  Pt has no evidence of surgical cause for bloating and pain.

## 2013-01-17 NOTE — Patient Instructions (Signed)
Follow up with [facility] GI functional disorders.

## 2013-01-17 NOTE — Progress Notes (Signed)
HISTORY: Pt presented with significant bloating/abd pain/ food aversion.  She was taken for dx laparoscopy in conjunction with gynecology and had no anatomic causes of pain seen.  Specifically, she had no adhesions, no hernias, no inflammation of the bowel, no endometriosis, no appendiceal abnormality, no differential dilation of the bowel seen.  She did appear to have soft stool throughout entire colon.  Post operatively, she reports taking only 1-2 pain pills at home.  She is only on lithium at this point for psychiatric issues.  She is off wellbutrin.  She has tried taking milk of magnesia several times since surgery and has a lot of "sludge-like" stool.  She states that she is unable to eat and feels very weak.      EXAM: General:  Alert and oriented. Incision:  Well healed.   PATHOLOGY: n/a   ASSESSMENT AND PLAN:   Abdominal  pain, other specified site Will refer to [facility] GI functional/motility disorder group for evaluation.  Pt has no evidence of surgical cause for bloating and pain.    Pt does appear to have lost around 6-7 pounds total since last summer, but has been between 128 and 134 pounds for last 6 months.       Maudry Diego, MD Surgical Oncology, General & Endocrine Surgery [facility]  Lorretta Harp, MD Panosh, Neta Mends, MD

## 2013-01-18 ENCOUNTER — Encounter (HOSPITAL_COMMUNITY): Payer: Managed Care, Other (non HMO)

## 2013-01-19 ENCOUNTER — Telehealth (INDEPENDENT_AMBULATORY_CARE_PROVIDER_SITE_OTHER): Payer: Self-pay

## 2013-01-19 ENCOUNTER — Ambulatory Visit (INDEPENDENT_AMBULATORY_CARE_PROVIDER_SITE_OTHER): Payer: Managed Care, Other (non HMO) | Admitting: Family

## 2013-01-19 ENCOUNTER — Encounter: Payer: Self-pay | Admitting: Family

## 2013-01-19 VITALS — BP 120/80 | HR 88 | Wt 134.0 lb

## 2013-01-19 DIAGNOSIS — R140 Abdominal distension (gaseous): Secondary | ICD-10-CM

## 2013-01-19 DIAGNOSIS — R143 Flatulence: Secondary | ICD-10-CM

## 2013-01-19 DIAGNOSIS — R141 Gas pain: Secondary | ICD-10-CM

## 2013-01-19 NOTE — Patient Instructions (Addendum)
1. Probiotic once daily  Bloating Bloating is the feeling of fullness in your belly. You may feel as though your pants are too tight. Often the cause of bloating is overeating, retaining fluids, or having gas in your bowel. It is also caused by swallowing air and eating foods that cause gas. Irritable bowel syndrome is one of the most common causes of bloating. Constipation is also a common cause. Sometimes more serious problems can cause bloating. SYMPTOMS  Usually there is a feeling of fullness, as though your abdomen is bulged out. There may be mild discomfort.  DIAGNOSIS  Usually no particular testing is necessary for most bloating. If the condition persists and seems to become worse, your caregiver may do additional testing.  TREATMENT   There is no direct treatment for bloating.  Do not put gas into the bowel. Avoid chewing gum and sucking on candy. These tend to make you swallow air. Swallowing air can also be a nervous habit. Try to avoid this.  Avoiding high residue diets will help. Eat foods with soluble fibers (examples include root vegetables, apples, or barley) and substitute dairy products with soy and rice products. This helps irritable bowel syndrome.  If constipation is the cause, then a high residue diet with more fiber will help.  Avoid carbonated beverages.  Over-the-counter preparations are available that help reduce gas. Your pharmacist can help you with this. SEEK MEDICAL CARE IF:   Bloating continues and seems to be getting worse.  You notice a weight gain.  You have a weight loss but the bloating is getting worse.  You have changes in your bowel habits or develop nausea or vomiting. SEEK IMMEDIATE MEDICAL CARE IF:   You develop shortness of breath or swelling in your legs.  You have an increase in abdominal pain or develop chest pain. Document Released: 06/04/2006 Document Revised: 10/27/2011 Document Reviewed: 07/23/2007 [facility] Patient Information  2014 [place], [place].

## 2013-01-19 NOTE — Telephone Encounter (Signed)
Patient called into office today regarding her Referral to [facility].  Patient rec'd a call from [facility] regarding an appointment.  Patient states she was advised that she cannot be scheduled until Sept. Also, the physician that she was referred to see Dr. Almyra Deforest which is now in Private Practice and does not accept insurance.  Patient also reports that physician charges $600.00 per hour and she cannot afford that.  Patient would like to know if there's something we can do to get her seen before Sept and with a facility that takes insurance.

## 2013-01-20 ENCOUNTER — Telehealth: Payer: Self-pay | Admitting: Neurology

## 2013-01-20 LAB — HEPATIC FUNCTION PANEL
AST: 19 U/L (ref 0–37)
Alkaline Phosphatase: 55 U/L (ref 39–117)
Bilirubin, Direct: 0.1 mg/dL (ref 0.0–0.3)

## 2013-01-20 NOTE — Progress Notes (Signed)
Subjective:    Patient ID: [name], female    DOB: 06-Feb-1961, 52 y.o.   MRN: [id number]  HPI 52 year old white female, nonsmoker, Bipolar, patient of Dr. Fabian Sharp is in today with c/o bloating and abdominal fullness since having an Exploratory Lap in April through general surgery. She has been out of work since March. Continues to c/o bloating. She has an appointment to see the Motility Clinic at [facility] next month. She sees GI, Dr. Loreta Ave who with whom she says is unsure of what else to do for her. She is requesting to have LFTs drawn today. She believes something is wrong with her liver. She has had an abdominal ultrasound 3 months ago that was negative.    Review of Systems  Constitutional: Negative.   HENT: Negative.   Respiratory: Negative.   Cardiovascular: Negative.   Gastrointestinal: Positive for abdominal pain and abdominal distention. Negative for constipation, blood in stool and anal bleeding.  Endocrine: Negative.   Genitourinary: Negative.   Musculoskeletal: Negative.   Skin: Negative.   Allergic/Immunologic: Negative.   Hematological: Negative.   Psychiatric/Behavioral: Negative.    Past Medical History  Diagnosis Date  . Bipolar disease, chronic     hosp Jan 13 [facility] depression borderline dependent traits   . Allergic rhinitis   . Headache(784.0)   . S/P total hysterectomy     hx of endometriosis  . History of being hospitalized 11-10    [facility], psych  . IBS (irritable bowel syndrome)     GI evaluation for pain IBS dx, Dr. Loreta Ave  . Chest pain 11/29/12     x 30 min last week, pressure that resolved, then returned- no medical care sought    History   Social History  . Marital Status: Single    Spouse Name: N/A    Number of Children: N/A  . Years of Education: N/A   Occupational History  . Not on file.   Social History Main Topics  . Smoking status: Former Games developer  . Smokeless tobacco: Never Used  . Alcohol Use: 1.2 oz/week    2 Cans of beer per  week  . Drug Use: No  . Sexually Active: Not on file   Other Topics Concern  . 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    no tobacco    Past Surgical History  Procedure Laterality Date  . Cholecystectomy    . Total abdominal hysterectomy    . Oophorectomy       with removal ovarian remnant  . Laparoscopy N/A 12/14/2012    Procedure: LAPAROSCOPY DIAGNOSTIC;  Surgeon: Almond Lint, MD;  Location: [facility] ORS;  Service: General;  Laterality: N/A;    Family History  Problem Relation Age of Onset  . Sleep apnea Father   . Migraines Sister     headaches  . Other Mother     MAC infection    Allergies  Allergen Reactions  . Morphine And Related Nausea And Vomiting    Current Outpatient Prescriptions on File Prior to Visit  Medication Sig Dispense Refill  . lithium 300 MG tablet Take 600 mg by mouth at bedtime.      . ondansetron (ZOFRAN) 4 MG tablet Take 4 mg by mouth every 8 (eight) hours as needed for nausea.      [name] Kitchen  promethazine (PHENERGAN) 25 MG tablet Take 25 mg by mouth every 6 (six) hours as needed for nausea.      . SUMAtriptan (IMITREX) 100 MG tablet Take 1 tablet (100 mg total) by mouth every 2 (two) hours as needed.  10 tablet  0  . zolpidem (AMBIEN) 10 MG tablet Take 5 mg by mouth at bedtime as needed for sleep.        No current facility-administered medications on file prior to visit.    BP 120/80  Pulse 88  Wt 134 lb (60.782 kg)  BMI 26.17 kg/m2  SpO2 97%chart    Objective:   Physical Exam  Constitutional: She is oriented to person, place, and time. She appears well-developed and well-nourished.  HENT:  Right Ear: External ear normal.  Left Ear: External ear normal.  Nose: Nose normal.  Mouth/Throat: Oropharynx is clear and moist.  Neck: Normal range of motion.  Cardiovascular: Normal rate,  regular rhythm and normal heart sounds.   Pulmonary/Chest: Effort normal and breath sounds normal.  Abdominal: Soft. Bowel sounds are normal. She exhibits no distension. There is no tenderness. There is no rebound.  Musculoskeletal: Normal range of motion.  Neurological: She is alert and oriented to person, place, and time.  Skin: Skin is warm and dry.  Psychiatric: She has a normal mood and affect.          Assessment & Plan:  Assessment:  1. Abdominal Bloating 2. Bipolar disorder  Plan: LFts. Sent. See specialist as scheduled. Consider Probiotic. Call the office if symptoms worsen or persist. Recheck as scheduled and as needed. Avoid sucking on straws or hard candy. Do not drink out of a straw.

## 2013-01-21 NOTE — Telephone Encounter (Signed)
I called pt back and she explained that she is still continues with numbness, in legs, feet, face, but her main focus is on her abdomen (feeling pressure, ? Gastroparesis, ).  Having blurred vision (bil), diaphoresis. These sx for the last 2 months.   Could this be neurologic?  She has been referred to [facility] Motility clinic in 02/2013 , per Dr. Loreta Ave.  I told her I would send to Dr. Pearlean Brownie..  Call her next week.

## 2013-01-24 ENCOUNTER — Telehealth: Payer: Self-pay | Admitting: Neurology

## 2013-01-24 NOTE — Telephone Encounter (Signed)
Seems unlikely. Keep appointment at [facility]

## 2013-01-25 NOTE — Telephone Encounter (Signed)
I called pt back and relayed that from last note entered per Dr. Pearlean Brownie about pts other issues, unlikely neurologic, would proceed with [facility] appt.  She relayed that she thought she was having a problem with vagus nerve.  I reiterated that would proceed with [facility] appt then could go from there.  She verbalized understanding.

## 2013-02-02 ENCOUNTER — Telehealth (INDEPENDENT_AMBULATORY_CARE_PROVIDER_SITE_OTHER): Payer: Self-pay

## 2013-02-02 ENCOUNTER — Emergency Department (HOSPITAL_COMMUNITY)
Admission: EM | Admit: 2013-02-02 | Discharge: 2013-02-03 | Disposition: A | Discharge status: Other Acute Inpatient Hospital | Payer: Managed Care, Other (non HMO) | Attending: Emergency Medicine | Admitting: Emergency Medicine

## 2013-02-02 ENCOUNTER — Encounter (HOSPITAL_COMMUNITY): Payer: Self-pay

## 2013-02-02 ENCOUNTER — Emergency Department (HOSPITAL_COMMUNITY): Payer: Managed Care, Other (non HMO)

## 2013-02-02 DIAGNOSIS — R140 Abdominal distension (gaseous): Secondary | ICD-10-CM

## 2013-02-02 DIAGNOSIS — F3289 Other specified depressive episodes: Secondary | ICD-10-CM | POA: Insufficient documentation

## 2013-02-02 DIAGNOSIS — F319 Bipolar disorder, unspecified: Secondary | ICD-10-CM | POA: Insufficient documentation

## 2013-02-02 DIAGNOSIS — F329 Major depressive disorder, single episode, unspecified: Secondary | ICD-10-CM | POA: Insufficient documentation

## 2013-02-02 DIAGNOSIS — R45851 Suicidal ideations: Secondary | ICD-10-CM | POA: Insufficient documentation

## 2013-02-02 DIAGNOSIS — G479 Sleep disorder, unspecified: Secondary | ICD-10-CM | POA: Insufficient documentation

## 2013-02-02 DIAGNOSIS — Z90710 Acquired absence of both cervix and uterus: Secondary | ICD-10-CM | POA: Insufficient documentation

## 2013-02-02 DIAGNOSIS — F39 Unspecified mood [affective] disorder: Secondary | ICD-10-CM | POA: Insufficient documentation

## 2013-02-02 DIAGNOSIS — F32A Depression, unspecified: Secondary | ICD-10-CM

## 2013-02-02 DIAGNOSIS — F3160 Bipolar disorder, current episode mixed, unspecified: Secondary | ICD-10-CM

## 2013-02-02 DIAGNOSIS — Z79899 Other long term (current) drug therapy: Secondary | ICD-10-CM | POA: Insufficient documentation

## 2013-02-02 DIAGNOSIS — Z9089 Acquired absence of other organs: Secondary | ICD-10-CM | POA: Insufficient documentation

## 2013-02-02 DIAGNOSIS — N390 Urinary tract infection, site not specified: Secondary | ICD-10-CM | POA: Insufficient documentation

## 2013-02-02 DIAGNOSIS — H538 Other visual disturbances: Secondary | ICD-10-CM | POA: Insufficient documentation

## 2013-02-02 DIAGNOSIS — F411 Generalized anxiety disorder: Secondary | ICD-10-CM | POA: Insufficient documentation

## 2013-02-02 DIAGNOSIS — Z8742 Personal history of other diseases of the female genital tract: Secondary | ICD-10-CM | POA: Insufficient documentation

## 2013-02-02 DIAGNOSIS — Z9079 Acquired absence of other genital organ(s): Secondary | ICD-10-CM | POA: Insufficient documentation

## 2013-02-02 DIAGNOSIS — R141 Gas pain: Secondary | ICD-10-CM | POA: Insufficient documentation

## 2013-02-02 DIAGNOSIS — Z8719 Personal history of other diseases of the digestive system: Secondary | ICD-10-CM | POA: Insufficient documentation

## 2013-02-02 DIAGNOSIS — Z87891 Personal history of nicotine dependence: Secondary | ICD-10-CM | POA: Insufficient documentation

## 2013-02-02 DIAGNOSIS — R109 Unspecified abdominal pain: Secondary | ICD-10-CM

## 2013-02-02 DIAGNOSIS — R142 Eructation: Secondary | ICD-10-CM | POA: Insufficient documentation

## 2013-02-02 DIAGNOSIS — Z8679 Personal history of other diseases of the circulatory system: Secondary | ICD-10-CM | POA: Insufficient documentation

## 2013-02-02 LAB — COMPREHENSIVE METABOLIC PANEL
AST: 17 U/L (ref 0–37)
CO2: 25 mEq/L (ref 19–32)
Chloride: 107 mEq/L (ref 96–112)
Creatinine, Ser: 0.7 mg/dL (ref 0.50–1.10)
GFR calc non Af Amer: >90 mL/min (ref >90)
Total Bilirubin: 0.7 mg/dL (ref 0.3–1.2)

## 2013-02-02 LAB — CBC WITH DIFFERENTIAL/PLATELET
Basophils Absolute: 0.1 10*3/uL (ref 0.0–0.1)
HCT: 37.8 % (ref 36.0–46.0)
Hemoglobin: 12.4 g/dL (ref 12.0–15.0)
Lymphocytes Relative: 24 % (ref 12–46)
Monocytes Absolute: 0.9 10*3/uL (ref 0.1–1.0)
Monocytes Relative: 11 % (ref 3–12)
Neutro Abs: 5 10*3/uL (ref 1.7–7.7)
RBC: 4.25 MIL/uL (ref 3.87–5.11)
RDW: 12.1 % (ref 11.5–15.5)
WBC: 8.2 10*3/uL (ref 4.0–10.5)

## 2013-02-02 LAB — URINALYSIS, ROUTINE W REFLEX MICROSCOPIC
Bilirubin Urine: NEGATIVE
Glucose, UA: NEGATIVE mg/dL
Hgb urine dipstick: NEGATIVE
Ketones, ur: NEGATIVE mg/dL
Protein, ur: NEGATIVE mg/dL

## 2013-02-02 LAB — LITHIUM LEVEL: Lithium Lvl: 0.45 mEq/L — ABNORMAL LOW (ref 0.80–1.40)

## 2013-02-02 NOTE — Telephone Encounter (Signed)
Patient states her abdomen is extended and hurting . She states she can wait to see to go to the motility clinic @ [facility] 03/15/13. Ask if she had a GI MD she said "Yes ,  DR. Loreta Ave but he is a JOKE  Im not going to see him"  . Patient also states she went to the ER in [place] on 02/25/13 which she was started on erythromycin. I advised her if her pain was extreme she may want to go to the ER to be evaluated. Patient verbalized understanding

## 2013-02-02 NOTE — ED Provider Notes (Signed)
History     [name]: [id number]  Arrival date & time 02/02/13  2108   First MD Initiated Contact with Patient 02/02/13 2303      Chief Complaint  Patient presents with  . Abdominal Pain  . Medical Clearance    (Consider location/radiation/quality/duration/timing/severity/associated sxs/prior treatment) HPI 52 year old female presents to the emergency department with complaint of persistent abdominal pain and bloating.  She reports this has been ongoing since March.  Patient has been seen by GI and surgery.  She had exploratory laparoscopy done in April for possible adhesions.  This was unremarkable per the report.  She reports she is unable to each or drink anything of substance due to the feeling of fullness.  She reports constipation that only improves with milk of magnesia.  Patient was seen in the emergency department at [facility] on the 11th of this month.  She was started on erythromycin.  She does not feel that this is helping with her motility issues.  She is awaiting an evaluation at the motility clinic at [facility] later this month.  Patient has history of bipolar disease.  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  She reports that she is thinking about suicide.  She reports that she is "on the verge of a nervous breakdown".  Patient reports she's been talking with her therapist and has requested to see a psychiatrist to get her medications straightened out, but is unable to see the psychiatrist anytime soon.  She feels that she needs admission to psychiatric facility to work out her issues.  Past Medical History  Diagnosis Date  . Bipolar disease, chronic     hosp Jan 13 [facility] depression borderline dependent traits   . Allergic rhinitis   . Headache(784.0)   . S/P total hysterectomy     hx of endometriosis  . History of being hospitalized 11-10    [facility], psych  . IBS  (irritable bowel syndrome)     GI evaluation for pain IBS dx, Dr. Loreta Ave  . Chest pain 11/29/12     x 30 min last week, pressure that resolved, then returned- no medical care sought    Past Surgical History  Procedure Laterality Date  . Cholecystectomy    . Total abdominal hysterectomy    . Oophorectomy       with removal ovarian remnant  . Laparoscopy N/A 12/14/2012    Procedure: LAPAROSCOPY DIAGNOSTIC;  Surgeon: Almond Lint, MD;  Location: [facility];  Service: General;  Laterality: N/A;    Family History  Problem Relation Age of Onset  . Sleep apnea Father   . Migraines Sister     headaches  . Other Mother     MAC infection    History  Substance Use Topics  . Smoking status: Former Games developer  . Smokeless tobacco: Never Used  . Alcohol Use: 1.2 oz/week    2 Cans of beer per week     Comment: socially     OB History   Grav Para Term Preterm Abortions TAB SAB Ect Mult Living                  Review of Systems  Eyes: Positive for visual disturbance (blurred near vision).  Psychiatric/Behavioral: Positive for suicidal ideas, sleep disturbance and dysphoric mood. The patient is nervous/anxious.     Allergies  Morphine and related  Home Medications  Current Outpatient Rx  Name  Route  Sig  Dispense  Refill  . erythromycin (ERY-TAB) 250 MG EC tablet   Oral   Take 250 mg by mouth 2 (two) times daily.         [name] Kitchen lithium 300 MG tablet   Oral   Take 600 mg by mouth at bedtime.         [name] Kitchen zolpidem (AMBIEN) 10 MG tablet   Oral   Take 5 mg by mouth at bedtime as needed for sleep.            BP 124/80  Pulse 72  Temp(Src) 98.9 F (37.2 C) (Oral)  Resp 18  Ht 5' (1.524 m)  Wt 132 lb 2 oz (59.932 kg)  BMI 25.8 kg/m2  SpO2 98%  Physical Exam  Nursing note and vitals reviewed. Constitutional: She is oriented to person, place, and time. She appears well-developed and well-nourished.  HENT:  Head: Normocephalic and atraumatic.  Nose: Nose normal.   Mouth/Throat: Oropharynx is clear and moist.  Eyes: Conjunctivae and EOM are normal. Pupils are equal, round, and reactive to light.  Neck: Normal range of motion. Neck supple. No JVD present. No tracheal deviation present. No thyromegaly present.  Cardiovascular: Normal rate, regular rhythm, normal heart sounds and intact distal pulses.  Exam reveals no gallop and no friction rub.   No murmur heard. Pulmonary/Chest: Effort normal and breath sounds normal. No stridor. No respiratory distress. She has no wheezes. She has no rales. She exhibits no tenderness.  Abdominal: Soft. She exhibits no distension and no mass. There is tenderness. There is no rebound and no guarding.  Bowel sounds present but hypoactive.  Diffuse mild lower abd pain no rebound or guarding  Musculoskeletal: Normal range of motion. She exhibits no edema and no tenderness.  Lymphadenopathy:    She has no cervical adenopathy.  Neurological: She is alert and oriented to person, place, and time. She has normal reflexes. She exhibits normal muscle tone. Coordination normal.  Skin: Skin is warm and dry. No rash noted. No erythema. No pallor.  Psychiatric: Her behavior is normal. Judgment and thought content normal.  Flat affect, sad, thoughts of suicide    ED Course  Procedures (including critical care time)  Labs Reviewed  URINALYSIS, ROUTINE W REFLEX MICROSCOPIC - Abnormal; Notable for the following:    APPearance TURBID (*)    Leukocytes, UA LARGE (*)    All other components within normal limits  LITHIUM LEVEL - Abnormal; Notable for the following:    Lithium Lvl 0.45 (*)    All other components within normal limits  URINE MICROSCOPIC-ADD ON - Abnormal; Notable for the following:    Squamous Epithelial / LPF FEW (*)    Bacteria, UA FEW (*)    All other components within normal limits  URINE CULTURE  CBC WITH DIFFERENTIAL  COMPREHENSIVE METABOLIC PANEL  LIPASE, BLOOD  ETHANOL  URINE RAPID DRUG SCREEN (HOSP  PERFORMED)   Dg Abd Acute W/chest  02/03/2013   *RADIOLOGY REPORT*  Clinical Data: Abdominal pain and bloating  ACUTE ABDOMEN SERIES (ABDOMEN 2 VIEW & CHEST 1 VIEW)  Comparison: Acute abdominal series 12/11/2012  Findings: Normal heart, mediastinal, and hilar contours.  The lungs are clear.  No free intraperitoneal air.  Bowel gas pattern is within normal limits. Moderate amount of stool in the colon.  Cholecystectomy clips are present.  Several bilateral pelvic phleboliths are stable.  Single surgical clip projects over the midline of the pelvis.  Facet joint degenerative changes on the left at L4-L5.  IMPRESSION: Nonobstructive bowel gas pattern.  Moderate stool in the colon.   Original Report Authenticated By: Britta Mccreedy, M.D.     1. Bloating   2. Abdominal pain   3. UTI (lower urinary tract infection)   4. Depression   5. Suicidal ideation       MDM  52 year old female with ongoing abdominal issues of bloating, fullness and pain.  She is awaiting evaluation and motility clinic in [place].  Her workup here does not show any acute obstruction or abnormality other than UTI. Patient also relates that she is having issues with depression and thoughts of suicide.  We'll have her assessed by the act team member.  She is medically cleared for a psychiatric treatment.  [name] Mackie, MD 02/03/13 [phone]

## 2013-02-02 NOTE — ED Notes (Signed)
Joe AC made aware of need for sitter 

## 2013-02-02 NOTE — ED Notes (Signed)
Pt had an exploratory lap on 4/29 and had been waiting to get to the motility clinic since then. Pt has been having trouble drinking for the past 5 days and has had an increase in trouble eating since March. Pt was recently seen at [facility] ER and given erythromycin for motility issues and it has not helped at all. Pt has spoken with her surgeon's office and GI office and was told to come here to be checked for a possible ileus. Pt has trouble sitting up in the evenings, increased pain. Pt is currently experiencing abdominal pain.

## 2013-02-03 DIAGNOSIS — R45851 Suicidal ideations: Secondary | ICD-10-CM

## 2013-02-03 DIAGNOSIS — F411 Generalized anxiety disorder: Secondary | ICD-10-CM

## 2013-02-03 DIAGNOSIS — F3289 Other specified depressive episodes: Secondary | ICD-10-CM

## 2013-02-03 DIAGNOSIS — F329 Major depressive disorder, single episode, unspecified: Secondary | ICD-10-CM

## 2013-02-03 DIAGNOSIS — R413 Other amnesia: Secondary | ICD-10-CM

## 2013-02-03 LAB — RAPID URINE DRUG SCREEN, HOSP PERFORMED
Amphetamines: NOT DETECTED
Barbiturates: NOT DETECTED
Benzodiazepines: NOT DETECTED
Tetrahydrocannabinol: NOT DETECTED

## 2013-02-03 MED ORDER — ONDANSETRON HCL 4 MG PO TABS: 4.0000 mg | ORAL_TABLET | Freq: Three times a day (TID) | ORAL | Status: DC | PRN

## 2013-02-03 MED ORDER — CEPHALEXIN 250 MG PO CAPS
250.0000 mg | ORAL_CAPSULE | Freq: Three times a day (TID) | ORAL | Status: DC
  Administered 2013-02-03: 250 mg via ORAL
  Filled 2013-02-03 (×4): qty 1

## 2013-02-03 MED ORDER — ZOLPIDEM TARTRATE 5 MG PO TABS: 5.0000 mg | ORAL_TABLET | Freq: Every evening | ORAL | Status: DC | PRN

## 2013-02-03 MED ORDER — ERYTHROMYCIN BASE 250 MG PO TBEC
250.0000 mg | DELAYED_RELEASE_TABLET | Freq: Two times a day (BID) | ORAL | Status: DC
  Filled 2013-02-03 (×3): qty 1

## 2013-02-03 MED ORDER — DICYCLOMINE HCL 20 MG PO TABS
20.0000 mg | ORAL_TABLET | Freq: Once | ORAL | Status: AC
  Administered 2013-02-03: 20 mg via ORAL
  Filled 2013-02-03: qty 1

## 2013-02-03 MED ORDER — ACETAMINOPHEN 325 MG PO TABS: 650.0000 mg | ORAL_TABLET | ORAL | Status: DC | PRN

## 2013-02-03 MED ORDER — SULFAMETHOXAZOLE-TMP DS 800-160 MG PO TABS
1.0000 | ORAL_TABLET | Freq: Once | ORAL | Status: AC
  Administered 2013-02-03: 1 via ORAL
  Filled 2013-02-03: qty 1

## 2013-02-03 MED ORDER — LORAZEPAM 1 MG PO TABS: 1.0000 mg | ORAL_TABLET | Freq: Three times a day (TID) | ORAL | Status: DC | PRN

## 2013-02-03 MED ORDER — IBUPROFEN 600 MG PO TABS
600.0000 mg | ORAL_TABLET | Freq: Three times a day (TID) | ORAL | Status: DC | PRN
  Administered 2013-02-03: 600 mg via ORAL
  Filled 2013-02-03: qty 1

## 2013-02-03 MED ORDER — LITHIUM CARBONATE 300 MG PO CAPS
600.0000 mg | ORAL_CAPSULE | Freq: Every day | ORAL | Status: DC
  Administered 2013-02-03: 600 mg via ORAL
  Filled 2013-02-03: qty 2

## 2013-02-03 MED ORDER — ONDANSETRON 8 MG PO TBDP
8.0000 mg | ORAL_TABLET | Freq: Once | ORAL | Status: AC
  Administered 2013-02-03: 8 mg via ORAL
  Filled 2013-02-03: qty 1

## 2013-02-03 MED ORDER — ALUM & MAG HYDROXIDE-SIMETH 200-200-20 MG/5ML PO SUSP: 30.0000 mL | ORAL | Status: DC | PRN

## 2013-02-03 NOTE — ED Notes (Signed)
Pt nauseated from meds.  Zofran given.  When pt nauseated, did experience stress incontinence.  Pt cleaned self and changed scrubs.  Report given to Joanie Coddington, RN in psych ED.

## 2013-02-03 NOTE — ED Provider Notes (Addendum)
Pt complains of abdominal pain after taking her medications last night.  She is on emycin for gi motility.  Pt states she has had several BMs.   Pt has been having trouble with abdominal pain and bloating since march.  Abdominal exam: abs soft nontender, no rebound or guarding  Will dc emycin for now.  Start keflex for the uti.  I have reviewed Dr Arita Miss note from June 2nd.  She has had an exploratory laparotomy as well as additional testing.  Per Dr Donell Beers, pt does not have an anatomic or surgical cause for her pain. I Do not feel further ED workup is indicated at this time.  Medically stable.  Celene Kras, MD 02/03/13 1610  Celene Kras, MD 02/03/13 [phone]

## 2013-02-03 NOTE — BH Assessment (Signed)
Assessment Note   [name] is an 52 y.o. female. Pt presents with medical issues related to GI problems.  Pt reports she has a number of medical problems that have not been resolved and they are "getting to me."  She also reports that she has been diagnosed with bipolar disorder since she was a teen.  Medical providers are starting to treat her like it is "all in my head" and pt is frustrated.  She also feels like her psych meds are not working either right now.  Her therapist has tried to get Dr Donell Beers to move up her July 17 appt and he has not responded either.  Pt reports she has had SI several times recently including today.  Pt reports plan to overdose on her medication and reports she cannot contract for safety at this time.  Pt denies HI.  Pt also reports that she had to put her long time dog down recently and has had three instances where she has had a visual hallucination of the dog back in her home.  Pt not concerned by this.  Last instance was last week.  Pt also drinking alcohol 3x week, more than usual for her, but only 1 beer per time.  Pt reports multiple psych admits over her lifetime, most recently last year at [facility].  Axis I: Bipolar, Depressed Axis II: Deferred Axis III:  Past Medical History  Diagnosis Date  . Bipolar disease, chronic     hosp Jan 13 [facility] depression borderline dependent traits   . Allergic rhinitis   . Headache(784.0)   . S/P total hysterectomy     hx of endometriosis  . History of being hospitalized 11-10    [facility], psych  . IBS (irritable bowel syndrome)     GI evaluation for pain IBS dx, Dr. Loreta Ave  . Chest pain 11/29/12     x 30 min last week, pressure that resolved, then returned- no medical care sought   Axis IV: medical problems Axis V: 31-40 impairment in reality testing  Past Medical History:  Past Medical History  Diagnosis Date  . Bipolar disease, chronic     hosp Jan 13 [facility] depression borderline dependent traits   .  Allergic rhinitis   . Headache(784.0)   . S/P total hysterectomy     hx of endometriosis  . History of being hospitalized 11-10    [facility], psych  . IBS (irritable bowel syndrome)     GI evaluation for pain IBS dx, Dr. Loreta Ave  . Chest pain 11/29/12     x 30 min last week, pressure that resolved, then returned- no medical care sought    Past Surgical History  Procedure Laterality Date  . Cholecystectomy    . Total abdominal hysterectomy    . Oophorectomy       with removal ovarian remnant  . Laparoscopy N/A 12/14/2012    Procedure: LAPAROSCOPY DIAGNOSTIC;  Surgeon: Almond Lint, MD;  Location: [facility];  Service: General;  Laterality: N/A;    Family History:  Family History  Problem Relation Age of Onset  . Sleep apnea Father   . Migraines Sister     headaches  . Other Mother     MAC infection    Social History:  reports that she has quit smoking. She has never used smokeless tobacco. She reports that she drinks about 1.2 ounces of alcohol per week. She reports that she does not use illicit drugs.  Additional Social History:  Alcohol / Drug Use Pain Medications: Pt denies all drugs History of alcohol / drug use?: Yes Substance #1 Name of Substance 1: alcohol 1 - Age of First Use: 15 1 - Amount (size/oz): 1 beer 1 - Frequency: 3xweek 1 - Duration: 1 month 1 - Last Use / Amount: 6/15, 1 beer  CIWA: CIWA-Ar BP: 121/80 mmHg Pulse Rate: 72 COWS:    Allergies:  Allergies  Allergen Reactions  . Morphine And Related Nausea And Vomiting    Home Medications:  (Not in a hospital admission)  OB/GYN Status:  No LMP recorded. Patient has had a hysterectomy.  General Assessment Data Location of Assessment: [facility] ACT Assessment: Yes Living Arrangements: Alone Can pt return to current living arrangement?: Yes Admission Status: Voluntary Is patient capable of signing voluntary admission?: Yes Transfer from: Home Referral Source: Self/Family/Friend     Risk to  self Suicidal Ideation: Yes-Currently Present Suicidal Intent: No Is patient at risk for suicide?: Yes Suicidal Plan?: Yes-Currently Present Specify Current Suicidal Plan: overdose Access to Means: Yes Specify Access to Suicidal Means: own meds What has been your use of drugs/alcohol within the last 12 months?: current alcohol use Previous Attempts/Gestures: No Intentional Self Injurious Behavior: None Family Suicide History: No Recent stressful life event(s): Other (Comment) (multiple medical issues) Persecutory voices/beliefs?: No Depression: Yes Depression Symptoms: Insomnia;Isolating;Fatigue;Loss of interest in usual pleasures;Feeling angry/irritable Substance abuse history and/or treatment for substance abuse?: No Suicide prevention information given to non-admitted patients: Not applicable  Risk to Others Homicidal Ideation: No Thoughts of Harm to Others: No Current Homicidal Intent: No Current Homicidal Plan: No Access to Homicidal Means: No History of harm to others?: No Assessment of Violence: None Noted Does patient have access to weapons?: No Criminal Charges Pending?: No Does patient have a court date: No  Psychosis Hallucinations: Visual Delusions: None noted  Mental Status Report Appear/Hygiene: Other (Comment) (casual) Eye Contact: Good Motor Activity: Unremarkable Speech: Logical/coherent Level of Consciousness: Alert Mood: Other (Comment) (pleasant) Affect: Appropriate to circumstance Anxiety Level: Minimal Thought Processes: Coherent;Relevant Judgement: Unimpaired Orientation: Person;Place;Time;Situation Obsessive Compulsive Thoughts/Behaviors: None  Cognitive Functioning Concentration: Normal Memory: Recent Intact;Remote Intact IQ: Average Insight: Good Impulse Control: Fair Appetite: Poor Weight Loss: 10 Weight Gain: 0 Sleep: Decreased Total Hours of Sleep: 5 Vegetative Symptoms: None  ADLScreening [facility] Assessment Services) Patient's  cognitive ability adequate to safely complete daily activities?: Yes Patient able to express need for assistance with ADLs?: Yes Independently performs ADLs?: Yes (appropriate for developmental age)  Abuse/Neglect [facility]) Physical Abuse: Denies Verbal Abuse: Denies Sexual Abuse: Denies  Prior Inpatient Therapy Prior Inpatient Therapy: Yes [facility] 2009, multiple other psych admits, around 10 total) Prior Therapy Dates: 08/2011 Prior Therapy Facilty/Provider(s): [facility] Reason for Treatment: psych  Prior Outpatient Therapy Prior Outpatient Therapy: Yes ([facility] for meds, [name]) Prior Therapy Dates: current Prior Therapy Facilty/Provider(s): Jerral Bonito, therapist Reason for Treatment: psych  ADL Screening (condition at time of admission) Patient's cognitive ability adequate to safely complete daily activities?: Yes Patient able to express need for assistance with ADLs?: Yes Independently performs ADLs?: Yes (appropriate for developmental age) Weakness of Legs: None Weakness of Arms/Hands: None  Home Assistive Devices/Equipment Home Assistive Devices/Equipment: None    Abuse/Neglect Assessment (Assessment to be complete while patient is alone) Physical Abuse: Denies Verbal Abuse: Denies Sexual Abuse: Denies Exploitation of patient/patient's resources: Denies Self-Neglect: Denies Values / Beliefs Cultural Requests During Hospitalization: None Spiritual Requests During Hospitalization: None   Advance Directives (For Healthcare) Advance Directive:  Patient does not have advance directive;Patient would not like information    Additional Information 1:1 In Past 12 Months?: No CIRT Risk: No Elopement Risk: No Does patient have medical clearance?: Yes     Disposition:  Disposition Initial Assessment Completed for this Encounter: Yes Disposition of Patient: Inpatient treatment program Type of inpatient treatment program: Adult  On Site Evaluation by:    Reviewed with Physician:     Lorri Frederick 02/03/2013 2:07 AM

## 2013-02-03 NOTE — ED Notes (Signed)
Report called to [facility], Charity fundraiser at [employer].

## 2013-02-03 NOTE — ED Notes (Signed)
Called [facility] for pt.'s transportation to OV.  [facility] eta 16min-1hr.

## 2013-02-03 NOTE — ED Notes (Signed)
Pt transferred from Main ED with complaint of abd pain, depression & SI, plan to take pills.  Pt reports feeling somewhat hopeless with decreased appetite x 3 days.  Pt calm &cooperative at present.

## 2013-02-03 NOTE — ED Notes (Signed)
Pt.'s B/P at 10:37 was 140/69.

## 2013-02-03 NOTE — ED Notes (Signed)
Pt has 1 personal belongings bag.

## 2013-02-03 NOTE — ED Notes (Signed)
Pt states that she has been unable to eat for a few days now.  After receiving meds, states that she feels nausea and requested crackers.  Gave pt crackers, pt is now requesting anti-nausea medication before she will attempt to eat crackers.

## 2013-02-03 NOTE — Consult Note (Signed)
Reason for Consult:Eval for IP psychiatric mgmt Referring Physician: Norlene Campbell MD  [name] is an 52 y.o. female.  HPI: pt is a 52 y/o WF, with long standing hx of bipolar d/o, last IP eval at [facility] Jan 2013. Pt is currently taking Lithium without compensation of her bipolar sx that include, decreased sleep, anger, irritability along with manic episodes. Pt does endorse periods of SI " taking an O/D of her medications" Pt denies SA/HI or AVH at present. Pt does endorse multiple factors that have caused her bipolar d/o decompensation to include her multiple health concerns, financial concerns due to being out of work and a recent passing of her dog. Pt has been self medicating with ingesting increased quantities of beer over the past several weeks.  Past Medical History  Diagnosis Date  . Bipolar disease, chronic     hosp Jan 13 [facility] depression borderline dependent traits   . Allergic rhinitis   . Headache(784.0)   . S/P total hysterectomy     hx of endometriosis  . History of being hospitalized 11-10    [facility], psych  . IBS (irritable bowel syndrome)     GI evaluation for pain IBS dx, Dr. Loreta Ave  . Chest pain 11/29/12     x 30 min last week, pressure that resolved, then returned- no medical care sought    Past Surgical History  Procedure Laterality Date  . Cholecystectomy    . Total abdominal hysterectomy    . Oophorectomy       with removal ovarian remnant  . Laparoscopy N/A 12/14/2012    Procedure: LAPAROSCOPY DIAGNOSTIC;  Surgeon: Almond Lint, MD;  Location: [facility];  Service: General;  Laterality: N/A;    Family History  Problem Relation Age of Onset  . Sleep apnea Father   . Migraines Sister     headaches  . Other Mother     MAC infection    Social History:  reports that she has quit smoking. She has never used smokeless tobacco. She reports that she drinks about 1.2 ounces of alcohol per week. She reports that she does not use illicit drugs.  Allergies:  Allergies   Allergen Reactions  . Morphine And Related Nausea And Vomiting    Medications: I have reviewed the patient's current medications.  Results for orders placed during the hospital encounter of 02/02/13 (from the past 48 hour(s))  CBC WITH DIFFERENTIAL     Status: None   Collection Time    02/02/13 10:32 PM      Result Value Range   WBC 8.2  4.0 - 10.5 K/uL   RBC 4.25  3.87 - 5.11 MIL/uL   Hemoglobin 12.4  12.0 - 15.0 g/dL   HCT 16.1  09.6 - 04.5 %   MCV 88.9  78.0 - 100.0 fL   MCH 29.2  26.0 - 34.0 pg   MCHC 32.8  30.0 - 36.0 g/dL   RDW 40.9  81.1 - 91.4 %   Platelets 288  150 - 400 K/uL   Neutrophils Relative % 61  43 - 77 %   Neutro Abs 5.0  1.7 - 7.7 K/uL   Lymphocytes Relative 24  12 - 46 %   Lymphs Abs 2.0  0.7 - 4.0 K/uL   Monocytes Relative 11  3 - 12 %   Monocytes Absolute 0.9  0.1 - 1.0 K/uL   Eosinophils Relative 3  0 - 5 %   Eosinophils Absolute 0.2  0.0 - 0.7 K/uL  Basophils Relative 1  0 - 1 %   Basophils Absolute 0.1  0.0 - 0.1 K/uL  COMPREHENSIVE METABOLIC PANEL     Status: None   Collection Time    02/02/13 10:32 PM      Result Value Range   Sodium 142  135 - 145 mEq/L   Potassium 4.0  3.5 - 5.1 mEq/L   Chloride 107  96 - 112 mEq/L   CO2 25  19 - 32 mEq/L   Glucose, Bld 96  70 - 99 mg/dL   BUN 12  6 - 23 mg/dL   Creatinine, Ser 5.40  0.50 - 1.10 mg/dL   Calcium 98.1  8.4 - 19.1 mg/dL   Total Protein 6.8  6.0 - 8.3 g/dL   Albumin 3.8  3.5 - 5.2 g/dL   AST 17  0 - 37 U/L   ALT 18  0 - 35 U/L   Alkaline Phosphatase 69  39 - 117 U/L   Total Bilirubin 0.7  0.3 - 1.2 mg/dL   GFR calc non Af Amer >90  >90 mL/min   GFR calc Af Amer >90  >90 mL/min   Comment:            The eGFR has been calculated     using the CKD EPI equation.     This calculation has not been     validated in all clinical     situations.     eGFR's persistently     <90 mL/min signify     possible Chronic Kidney Disease.  LIPASE, BLOOD     Status: None   Collection Time     02/02/13 10:32 PM      Result Value Range   Lipase 25  11 - 59 U/L  URINALYSIS, ROUTINE W REFLEX MICROSCOPIC     Status: Abnormal   Collection Time    02/02/13 11:05 PM      Result Value Range   Color, Urine YELLOW  YELLOW   APPearance TURBID (*) CLEAR   Specific Gravity, Urine 1.015  1.005 - 1.030   pH 7.0  5.0 - 8.0   Glucose, UA NEGATIVE  NEGATIVE mg/dL   Hgb urine dipstick NEGATIVE  NEGATIVE   Bilirubin Urine NEGATIVE  NEGATIVE   Ketones, ur NEGATIVE  NEGATIVE mg/dL   Protein, ur NEGATIVE  NEGATIVE mg/dL   Urobilinogen, UA 0.2  0.0 - 1.0 mg/dL   Nitrite NEGATIVE  NEGATIVE   Leukocytes, UA LARGE (*) NEGATIVE  URINE MICROSCOPIC-ADD ON     Status: Abnormal   Collection Time    02/02/13 11:05 PM      Result Value Range   Squamous Epithelial / LPF FEW (*) RARE   WBC, UA 21-50  <3 WBC/hpf   Bacteria, UA FEW (*) RARE   Urine-Other AMORPHOUS URATES/PHOSPHATES    ETHANOL     Status: None   Collection Time    02/02/13 11:19 PM      Result Value Range   Alcohol, Ethyl (B) <11  0 - 11 mg/dL   Comment:            LOWEST DETECTABLE LIMIT FOR     SERUM ALCOHOL IS 11 mg/dL     FOR MEDICAL PURPOSES ONLY  LITHIUM LEVEL     Status: Abnormal   Collection Time    02/02/13 11:19 PM      Result Value Range   Lithium Lvl 0.45 (*) 0.80 - 1.40 mEq/L  URINE RAPID  DRUG SCREEN (HOSP PERFORMED)     Status: None   Collection Time    02/02/13 11:57 PM      Result Value Range   Opiates NONE DETECTED  NONE DETECTED   Cocaine NONE DETECTED  NONE DETECTED   Benzodiazepines NONE DETECTED  NONE DETECTED   Amphetamines NONE DETECTED  NONE DETECTED   Tetrahydrocannabinol NONE DETECTED  NONE DETECTED   Barbiturates NONE DETECTED  NONE DETECTED   Comment:            DRUG SCREEN FOR MEDICAL PURPOSES     ONLY.  IF CONFIRMATION IS NEEDED     FOR ANY PURPOSE, NOTIFY LAB     WITHIN 5 DAYS.                LOWEST DETECTABLE LIMITS     FOR URINE DRUG SCREEN     Drug Class       Cutoff (ng/mL)      Amphetamine      1000     Barbiturate      200     Benzodiazepine   200     Tricyclics       300     Opiates          300     Cocaine          300     THC              50    Dg Abd Acute W/chest  02/03/2013   *RADIOLOGY REPORT*  Clinical Data: Abdominal pain and bloating  ACUTE ABDOMEN SERIES (ABDOMEN 2 VIEW & CHEST 1 VIEW)  Comparison: Acute abdominal series 12/11/2012  Findings: Normal heart, mediastinal, and hilar contours.  The lungs are clear.  No free intraperitoneal air.  Bowel gas pattern is within normal limits. Moderate amount of stool in the colon.  Cholecystectomy clips are present.  Several bilateral pelvic phleboliths are stable.  Single surgical clip projects over the midline of the pelvis.  Facet joint degenerative changes on the left at L4-L5.  IMPRESSION: Nonobstructive bowel gas pattern.  Moderate stool in the colon.   Original Report Authenticated By: Britta Mccreedy, M.D.    Review of Systems  Constitutional: Negative.   HENT: Negative.   Eyes: Positive for blurred vision.  Respiratory: Negative.   Cardiovascular: Negative.   Gastrointestinal: Positive for nausea, abdominal pain and constipation. Negative for vomiting, blood in stool and melena.  Genitourinary: Positive for dysuria, urgency and frequency. Negative for hematuria and flank pain.  Musculoskeletal: Negative.   Skin: Negative.   Neurological: Negative for focal weakness, seizures and loss of consciousness.  Endo/Heme/Allergies: Negative.   Psychiatric/Behavioral: Positive for depression, suicidal ideas and memory loss. Negative for hallucinations and substance abuse. The patient is nervous/anxious and has insomnia.    Blood pressure 120/83, pulse 68, temperature 98.5 F (36.9 C), temperature source Oral, resp. rate 20, height 5' (1.524 m), weight 59.932 kg (132 lb 2 oz), SpO2 98.00%. Physical Exam  Vitals reviewed. Constitutional: She is oriented to person, place, and time. She appears well-developed.   HENT:  Head: Normocephalic.  Eyes: Pupils are equal, round, and reactive to light.  Neck: Neck supple. No thyromegaly present.  Cardiovascular: Normal rate.   Respiratory: Effort normal.  GI: Soft. Bowel sounds are normal.  Neurological: She is alert and oriented to person, place, and time.  Skin: Skin is warm and dry.  Psychiatric:  Sad, hopeless affect, good eye contact with fair  thought pattern and insight.    Assessment/Plan: 1) Recommend IP psychiatric placement at [facility] 500 hall pending bed fro crises mgmt, safety and stabilization 2) intensive IP psycho therapy and psychotropic mgmt. 3) Mgmt of applicable acute and or chronic co morbid conditions  Parker Sawatzky E 02/03/2013, 5:46 AM

## 2013-02-03 NOTE — BHH Counselor (Signed)
Per Morrie Sheldon, patient accepted  to [facility] by Dr. Hardie Pulley. Pt to report to the [place] building. Call report #[phone].

## 2013-02-04 LAB — URINE CULTURE: Colony Count: 50000

## 2013-02-11 ENCOUNTER — Other Ambulatory Visit: Payer: Self-pay | Admitting: *Deleted

## 2013-02-11 ENCOUNTER — Telehealth: Payer: Self-pay | Admitting: Internal Medicine

## 2013-02-11 NOTE — Telephone Encounter (Signed)
Pt called and stated that she was discharged from the hospital last night, and was told to follow up with her pcp. She was seen for depression, but also has a UTI. She would like to be seen today. Please assist in scheduling this.

## 2013-02-11 NOTE — Telephone Encounter (Signed)
Spoke with patient and she is taking bactrim for her UTI.  She will come in next week for a follow up hospital.

## 2013-02-14 ENCOUNTER — Telehealth: Payer: Self-pay | Admitting: Internal Medicine

## 2013-02-14 NOTE — Telephone Encounter (Signed)
Left message on home/cell for the pt to return my call. 

## 2013-02-14 NOTE — Telephone Encounter (Signed)
Pt has an appt scheduled with PCP tomorrow for post hospital visit.  However, pt has recurrent UTI symptoms and swollen vaginal area and is requesting to be worked in today instead.  Please advise.

## 2013-02-15 ENCOUNTER — Ambulatory Visit (INDEPENDENT_AMBULATORY_CARE_PROVIDER_SITE_OTHER): Payer: Managed Care, Other (non HMO) | Admitting: Internal Medicine

## 2013-02-15 ENCOUNTER — Encounter: Payer: Self-pay | Admitting: Internal Medicine

## 2013-02-15 VITALS — BP 104/76 | HR 78 | Temp 97.6°F | Wt 138.0 lb

## 2013-02-15 DIAGNOSIS — R300 Dysuria: Secondary | ICD-10-CM

## 2013-02-15 DIAGNOSIS — R109 Unspecified abdominal pain: Secondary | ICD-10-CM

## 2013-02-15 HISTORY — DX: Dysuria: R30.0

## 2013-02-15 LAB — POCT URINALYSIS DIPSTICK
Bilirubin, UA: NEGATIVE
Ketones, UA: NEGATIVE
Nitrite, UA: NEGATIVE
Protein, UA: NEGATIVE

## 2013-02-15 MED ORDER — NITROFURANTOIN MONOHYD MACRO 100 MG PO CAPS: 100.0000 mg | ORAL_CAPSULE | Freq: Two times a day (BID) | ORAL | Status: DC

## 2013-02-15 NOTE — Telephone Encounter (Signed)
Patient seen in the office on 02/15/13

## 2013-02-15 NOTE — Progress Notes (Signed)
Chief Complaint  Patient presents with  . Follow-up    Hospital    HPI: Patient comes in today for followup of hospitalization for depression behavioral health medication management but also had a UTI. She was hospitalized from 619 09/23/2024. Was given Septra in the emergency room. Was switched to Keflex  because of an episode of vomiting. However switched back ; now on septra   Day 5 . Out of 7   .     [name] Kitchen  Some feeling and  Then poor   Empty bladder   .  Didn't know she had a UTI but did complain of suprapubic cramping which was different from any of her other abdominal pains. Possible some urinary urgency but no blood. She is better than she was before but is concerned that she has some swelling around her urethra but no vaginal discharge or itching.  Her medications were changed in the hospital including increased lithium and adding llatuda she may be doing some better but is only been on for 5 days. To followup as outpatient.   She's planning on going back to work at  [employer] with a altered schedule to see how she does. ROS: See pertinent positives and negatives per HPI.  Past Medical History  Diagnosis Date  . Bipolar disease, chronic     hosp Jan 13 [facility] depression borderline dependent traits   . Allergic rhinitis   . Headache(784.0)   . S/P total hysterectomy     hx of endometriosis  . History of being hospitalized 11-10    [facility], psych  . IBS (irritable bowel syndrome)     GI evaluation for pain IBS dx, Dr. Loreta Ave  . Chest pain 11/29/12     x 30 min last week, pressure that resolved, then returned- no medical care sought    Family History  Problem Relation Age of Onset  . Sleep apnea Father   . Migraines Sister     headaches  . Other Mother     MAC infection    History   Social History  . Marital Status: Single    Spouse Name: N/A    Number of Children: N/A  . Years of Education: N/A   Social History Main Topics  . Smoking status: Former Games developer  .  Smokeless tobacco: Never Used  . Alcohol Use: 1.2 oz/week    2 Cans of beer per week     Comment: socially   . Drug Use: No  . Sexually Active: None   Other Topics Concern  . 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    no tobacco    Outpatient Encounter Prescriptions as of 02/15/2013  Medication Sig Dispense Refill  . lithium 300 MG tablet Take 900 mg by mouth at bedtime.       . Lurasidone HCl (LATUDA) 20 MG TABS Take 1 tablet by mouth at bedtime.      . sulfamethoxazole-trimethoprim (BACTRIM DS,SEPTRA DS) 800-160 MG per tablet Take 1 tablet by mouth 2 (two) times daily.      [name] Kitchen zolpidem (AMBIEN) 10 MG tablet Take 5 mg by mouth at bedtime as needed for sleep.       . nitrofurantoin, macrocrystal-monohydrate, (MACROBID) 100 MG capsule Take 1 capsule (100 mg total) by mouth 2 (two) times daily. For uti  14 capsule  0  . [DISCONTINUED] erythromycin (ERY-TAB) 250 MG EC tablet Take 250 mg by mouth 2 (two) times daily.       No facility-administered encounter medications on file as of 02/15/2013.    EXAM:  BP 104/76  Pulse 78  Temp(Src) 97.6 F (36.4 C) (Oral)  Wt 138 lb (62.596 kg)  BMI 26.95 kg/m2  SpO2 98%  Body mass index is 26.95 kg/(m^2).  GENERAL: vitals reviewed and listed above, alert, oriented, appears well hydrated and in no acute distress  HEENT: atraumatic, conjunctiva  clear, no obvious abnormalities on inspection of external nose and ears  NECK: no obvious masses on inspection palpation  CV: HRRR, no clubbing cyanosis or  peripheral edema nl cap refill  Abdomen soft without organomegaly guarding or rebound External GU looks normal there some prominence around the urethra vaginal exam prominent urethra but I don't think she has  Prolapse. MS: moves all extremities without noticeable focal  abnormality PSYCH:  pleasant and cooperative, normal speech  ASSESSMENT AND PLAN:  Discussed the following assessment and plan:  Dysuria - Plan: POC Urinalysis Dipstick, Urine culture, Urinalysis  Abdominal pain - Plan: Urine culture, Urinalysis Possibly partially treated UTI urine culture in hospital showed multiple species at this time since she is somewhat better would finish out her Septra course and if persistent or relapsing repeat urine culture and urinalysis off of antibiotics before beginning another one. Because of the holidays coming up I did given her a written prescription for Macrobid in case. Orders have been placed in the electronic record that she can come and get the urine culture and urinalysis at any time in this time frame. It is possible that she does have bladder dysfunction but at this time no obvious obstruction it would not pursue that route with the multiple medical valuation she's had recently if however she has having recurrent infection we can revisit this. She apparently has a pending GI consult with the motility clinic in regard to her other difficulties. Behavioral health she seems to be doing some better after close monitoring in hospital. -Patient advised to return or notify health care team  if symptoms worsen or persist or new concerns arise.  Patient Instructions  urinalysis is improved . Finish the antibiotic  If not continuing to improve or  Worsening off the antibiotic  Come in to get urine culture and UA good mid stream clean catch.     Would switch to macrobid at that time and await repeat culture test.   Can use pyridium if needed in the short run .    Neta Mends. Panosh M.D.

## 2013-02-15 NOTE — Patient Instructions (Addendum)
urinalysis is improved . Finish the antibiotic  If not continuing to improve or  Worsening off the antibiotic  Come in to get urine culture and UA good mid stream clean catch.     Would switch to macrobid at that time and await repeat culture test.   Can use pyridium if needed in the short run .

## 2013-03-04 ENCOUNTER — Other Ambulatory Visit: Payer: Self-pay

## 2013-03-04 ENCOUNTER — Encounter: Payer: Self-pay | Admitting: Internal Medicine

## 2013-03-04 ENCOUNTER — Telehealth: Payer: Self-pay | Admitting: Internal Medicine

## 2013-03-04 ENCOUNTER — Ambulatory Visit (INDEPENDENT_AMBULATORY_CARE_PROVIDER_SITE_OTHER): Payer: Managed Care, Other (non HMO) | Admitting: Internal Medicine

## 2013-03-04 VITALS — BP 110/72 | HR 81 | Temp 98.0°F

## 2013-03-04 DIAGNOSIS — N390 Urinary tract infection, site not specified: Secondary | ICD-10-CM

## 2013-03-04 DIAGNOSIS — N3090 Cystitis, unspecified without hematuria: Secondary | ICD-10-CM

## 2013-03-04 LAB — POCT URINALYSIS DIPSTICK
Bilirubin, UA: NEGATIVE
Nitrite, UA: NEGATIVE
Urobilinogen, UA: 0.2
pH, UA: 5

## 2013-03-04 MED ORDER — PHENAZOPYRIDINE HCL 200 MG PO TABS: 200.0000 mg | ORAL_TABLET | Freq: Three times a day (TID) | ORAL | Status: DC | PRN

## 2013-03-04 MED ORDER — SULFAMETHOXAZOLE-TRIMETHOPRIM 800-160 MG PO TABS: 1.0000 | ORAL_TABLET | Freq: Two times a day (BID) | ORAL | Status: DC

## 2013-03-04 NOTE — Patient Instructions (Addendum)
It was good to see you today. We have reviewed your prior records including labs and tests today antibiotics 2x/day x 1 week and pyridium as needed for pain/discomfort in bladder Your prescription(s) have been submitted to your pharmacy. Please take as directed and contact our office if you believe you are having problem(s) with the medication(s). we'll make referral to urology. Our office will contact you regarding appointment(s) once made. Interstitial Cystitis Interstitial cystitis (IC) is a condition that results in discomfort or pain in the bladder and the surrounding pelvic region. The symptoms can be different from case to case and even in the same individual. People may experience:  Mild discomfort.  Pressure.  Tenderness.  Intense pain in the bladder and pelvic area. CAUSES  Because IC varies so much in symptoms and severity, people studying this disease believe it is not one but several diseases. Some caregivers use the term painful bladder syndrome (PBS) to describe cases with painful urinary symptoms. This may not meet the strictest definition of IC. The term IC / PBS includes all cases of urinary pain that cannot be connected to other causes, such as infection or urinary stones.  SYMPTOMS  Symptoms may include:  An urgent need to urinate.  A frequent need to urinate.  A combination of these symptoms. Pain may change in intensity as the bladder fills with urine or as it empties. Women's symptoms often get worse during menstruation. They may sometimes experience pain with vaginal intercourse. Some of the symptoms of IC / PBS seem like those of bacterial infection. Tests do not show infection. IC / PBS is far more common in women than in men.  DIAGNOSIS  The diagnosis of IC / PBS is based on:  Presence of pain related to the bladder, usually along with problems of frequency and urgency.  Not finding other diseases that could cause the symptoms.  Diagnostic tests that help  rule out other diseases include:  Urinalysis.  Urine culture.  Cystoscopy.  Biopsy of the bladder wall.  Distension of the bladder under anesthesia.  Urine cytology.  Laboratory examination of prostate secretions. A biopsy is a tissue sample that can be looked at under a microscope. Samples of the bladder and urethra may be removed during a cystoscopy. A biopsy helps rule out bladder cancer. TREATMENT  Scientists have not yet found a cure for IC / PBS. Patients with IC / PBS do not get better with antibiotic therapy. Caregivers cannot predict who will respond best to which treatment. Symptoms may disappear without explanation. Disappearing symptoms may coincide with an event such as a change in diet or treatment. Even when symptoms disappear, they may return after days, weeks, months, or years.  Because the causes of IC / PBS are unknown, current treatments are aimed at relieving symptoms. Many people are helped by one or a combination of the treatments. As researchers learn more about IC / PBS, the list of potential treatments will change. Patients should discuss their options with a caregiver. SURGERY  Surgery should be considered only if all available treatments have failed and the pain is disabling. Many approaches and techniques are used. Each approach has its own advantages and complications. Advantages and complications should be discussed with a urologist. Your caregiver may recommend consulting another urologist for a second opinion. Most caregivers are reluctant to operate because the outcome is unpredictable. Some people still have symptoms after surgery.  People considering surgery should discuss the potential risks and benefits, side effects, and long-  and short-term complications with their family, as well as with people who have already had the procedure. Surgery requires anesthesia, hospitalization, and in some cases weeks or months of recovery. As the complexity of the  procedure increases, so do the chances for complications and for failure. HOME CARE INSTRUCTIONS   All drugs, even those sold over the counter, have side effects. Patients should always consult a caregiver before using any drug for an extended amount of time. Only take over-the-counter or prescription medicines for pain, discomfort, or fever as directed by your caregiver.  Many patients feel that smoking makes their symptoms worse. How the by-products of tobacco that are excreted in the urine affect IC / PBS is unknown. Smoking is the major known cause of bladder cancer. One of the best things smokers can do for their bladder and their overall health is to quit.  Many patients feel that gentle stretching exercises help relieve IC / PBS symptoms.  Methods vary, but basically patients decide to empty their bladder at designated times and use relaxation techniques and distractions to keep to the schedule. Gradually, patients try to lengthen the time between scheduled voids. A diary in which to record voiding times is usually helpful in keeping track of progress. MAKE SURE YOU:   Understand these instructions.  Will watch your condition.  Will get help right away if you are not doing well or get worse. Document Released: 04/04/2004 Document Revised: 10/27/2011 Document Reviewed: 06/19/2008 [facility] Patient Information 2014 [place], [place].

## 2013-03-04 NOTE — Telephone Encounter (Signed)
Pt states she was seen on 02/15/13 for uti.  Still having symptoms and requesting to be seen today, [facility].  Pt scheduled to see Dr. Felicity Coyer at [place] office today at 4pm.

## 2013-03-04 NOTE — Progress Notes (Signed)
HPI: complains of ?UTI symptoms Onset 6 days ago patient of antibiotic therapy for second UTI in recent 6 weeks symptoms  progressively worse associated with dysuria and small volume voiding with increased frequency denies hematuria, flank pain or fever The patient has a history of prior UTI  PMH: reviewed  ROS:  Gen.: No unexpected weight change, no night sweats Lungs: No cough or shortness of breath Cardiovascular: No palpitations or chest pain  PE: BP 110/72  Pulse 81  Temp(Src) 98 F (36.7 C) (Oral)  SpO2 96% General: No acute distress Lungs: Clear to auscultation Cardiovascular: Regular rate rhythm, no edema Abdomen: Mild to moderate discomfort of her suprapubic region, no flank tenderness to palpation  Lab Results  Component Value Date   WBC 8.2 02/02/2013   HGB 12.4 02/02/2013   HCT 37.8 02/02/2013   PLT 288 02/02/2013   GLUCOSE 96 02/02/2013   ALT 18 02/02/2013   AST 17 02/02/2013   NA 142 02/02/2013   K 4.0 02/02/2013   CL 107 02/02/2013   CREATININE 0.70 02/02/2013   BUN 12 02/02/2013   CO2 25 02/02/2013   TSH 0.95 05/17/2012   INR 1.05 10/13/2010   HGBA1C 5.1 01/05/2013    Assessment/Plan: Cystitis/dysuria ?UTI, recent symptoms reviewed with history of same  Empiric antibiotic x7 days + pyridium for symptomatic relief Urine culture for identification and sensitivities Refer to uro - ?IC Hydration recommended education provided

## 2013-03-06 LAB — URINE CULTURE

## 2013-03-10 ENCOUNTER — Ambulatory Visit (INDEPENDENT_AMBULATORY_CARE_PROVIDER_SITE_OTHER): Payer: Managed Care, Other (non HMO) | Admitting: Family Medicine

## 2013-03-10 ENCOUNTER — Telehealth: Payer: Self-pay

## 2013-03-10 ENCOUNTER — Encounter: Payer: Self-pay | Admitting: Family Medicine

## 2013-03-10 VITALS — BP 110/68 | Temp 98.2°F | Wt 139.0 lb

## 2013-03-10 DIAGNOSIS — F4321 Adjustment disorder with depressed mood: Secondary | ICD-10-CM

## 2013-03-10 DIAGNOSIS — R141 Gas pain: Secondary | ICD-10-CM

## 2013-03-10 DIAGNOSIS — R140 Abdominal distension (gaseous): Secondary | ICD-10-CM

## 2013-03-10 DIAGNOSIS — F319 Bipolar disorder, unspecified: Secondary | ICD-10-CM

## 2013-03-10 LAB — COMPREHENSIVE METABOLIC PANEL
Albumin: 4.1 g/dL (ref 3.5–5.2)
BUN: 15 mg/dL (ref 6–23)
CO2: 26 mEq/L (ref 19–32)
Calcium: 10 mg/dL (ref 8.4–10.5)
Chloride: 108 mEq/L (ref 96–112)
Creatinine, Ser: 0.8 mg/dL (ref 0.4–1.2)
GFR: 81.49 mL/min (ref >60.00)
Glucose, Bld: 75 mg/dL (ref 70–99)
Potassium: 4.4 mEq/L (ref 3.5–5.1)

## 2013-03-10 LAB — CBC WITH DIFFERENTIAL/PLATELET
Basophils Relative: 0.5 % (ref 0.0–3.0)
Eosinophils Absolute: 0.3 10*3/uL (ref 0.0–0.7)
HCT: 37.9 % (ref 36.0–46.0)
Lymphocytes Relative: 25.2 % (ref 12.0–46.0)
Monocytes Absolute: 0.7 10*3/uL (ref 0.1–1.0)
Neutro Abs: 3.2 10*3/uL (ref 1.4–7.7)
Platelets: 310 10*3/uL (ref 150.0–400.0)
RDW: 12.8 % (ref 11.5–14.6)
WBC: 5.6 10*3/uL (ref 4.5–10.5)

## 2013-03-10 LAB — TSH: TSH: 0.73 u[IU]/mL (ref 0.35–5.50)

## 2013-03-10 NOTE — Telephone Encounter (Signed)
Spoke with pt and pt states that she has been seen by urology and diagnosed with Interstitial cysitis and she is still having back pain.  Pt states she also has a new onset of weight gain and abdominal bloating that is worse than ever.  Pt states she called and spoke with her psychiatrist who told pt it probably was not due to the change in her medication and pt should be seen by primary care.  Pt states the psychiatrist wanted some lab work done as well.  Advised pt that per Dr. Fabian Sharp she could have labs drawn today and come discuss them with Dr. Fabian Sharp next week.  Pt declined and states she needed to be see today.

## 2013-03-10 NOTE — Progress Notes (Signed)
Chief Complaint  Patient presents with  . Abdominal Pain    and distention. weight gain and back pain     HPI:  Acute visit for bloating/abd pain chronically: -this is chronic and ongoing and  has had several extensive workup with GI, Surgery and gyn with [place], CTs, gyn evals, endoscopy, labs, etc with dx of IBS followed by GI and PCP - she makes frequent visit for these issues -reports had some bloating this morning which improved after bowel movement -she has many psych issues and reports talked to psychiatrist today whom told her to have CBC, TSH, CMP and lithium level drawn and she wanted to have it done here -PCP is aware and I will forward labs to her -denies: fevers, dysuria, vomiting, diarrhea -has another evaluation coming up at [facility] soon for evaluation for gut motility  ROS: See pertinent positives and negatives per HPI.  Past Medical History  Diagnosis Date  . Bipolar disease, chronic     hosp Jan 13 [facility] depression borderline dependent traits   . Allergic rhinitis   . Headache(784.0)   . S/P total hysterectomy     hx of endometriosis  . History of being hospitalized 11-10    [facility], psych  . IBS (irritable bowel syndrome)     GI evaluation for pain IBS dx, Dr. Loreta Ave  . Chest pain 11/29/12     x 30 min last week, pressure that resolved, then returned- no medical care sought    Family History  Problem Relation Age of Onset  . Sleep apnea Father   . Migraines Sister     headaches  . Other Mother     MAC infection    History   Social History  . Marital Status: Single    Spouse Name: N/A    Number of Children: N/A  . Years of Education: N/A   Social History Main Topics  . Smoking status: Former Games developer  . Smokeless tobacco: Never Used  . Alcohol Use: 1.2 oz/week    2 Cans of beer per week     Comment: socially   . Drug Use: No  . Sexually Active: None   Other Topics Concern  . 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    no tobacco    Current outpatient prescriptions:lithium 300 MG tablet, Take 900 mg by mouth at bedtime. , Disp: , Rfl: ;  Lurasidone HCl (LATUDA) 20 MG TABS, Take 1 tablet by mouth at bedtime., Disp: , Rfl: ;  zolpidem (AMBIEN) 10 MG tablet, Take 5 mg by mouth at bedtime as needed for sleep. , Disp: , Rfl: ;  phenazopyridine (PYRIDIUM) 200 MG tablet, Take 1 tablet (200 mg total) by mouth 3 (three) times daily as needed for pain., Disp: 21 tablet, Rfl: 0 sulfamethoxazole-trimethoprim (BACTRIM DS,SEPTRA DS) 800-160 MG per tablet, Take 1 tablet by mouth 2 (two) times daily., Disp: 14 tablet, Rfl: 0  EXAM:  Filed Vitals:   03/10/13 1422  BP: 110/68  Temp: 98.2 F (36.8 C)    Body mass index is 27.15 kg/(m^2).  GENERAL: vitals reviewed and listed above, alert, oriented, appears well hydrated and in no acute distress  HEENT: atraumatic, conjunttiva clear, no obvious abnormalities on inspection of external nose and ears  NECK: no obvious masses on inspection  LUNGS: clear to  auscultation bilaterally, no wheezes, rales or rhonchi, good air movement  CV: HRRR, no peripheral edema  ABD: BS+, soft,  Mild difuse TTP, no rebound or guarding  MS: moves all extremities without noticeable abnormality  PSYCH: pleasant and cooperative, no obvious depression or anxiety  ASSESSMENT AND PLAN:  Discussed the following assessment and plan:  Abdominal bloating - Plan: CMP, TSH, Lithium level, CBC with Differential  DISORDER, BIPOLAR NOS - Plan: CMP, TSH, Lithium level, CBC with Differential  Adjustment disorder with depressed mood - Plan: CMP, TSH, Lithium level, CBC with Differential  -benign exam -discussed pt with PCP and she is ok w/ ordering labs advised by pscyh and I will forward them to her -reassured pt that bloating is common with IBS and advised pt to  follow up with GI and keep appt scheduled at [facility] -Patient advised to follow up with PCP routinely or as needed  There are no Patient Instructions on file for this visit.   Kriste Basque R.

## 2013-03-10 NOTE — Telephone Encounter (Signed)
Per Dr. Elmyra Ricks request called pt to advise that Dr. Selena Batten and Dr. Fabian Sharp spoke and this a chronic problem for pt and unless pt has a new onset of symptoms pt can be seen by Dr. Fabian Sharp next week.   Called and left patient a voicemail to return call.

## 2013-03-11 LAB — LITHIUM LEVEL: Lithium Lvl: 0.8 mEq/L (ref 0.80–1.40)

## 2013-03-23 ENCOUNTER — Telehealth: Payer: Self-pay | Admitting: Internal Medicine

## 2013-03-23 ENCOUNTER — Other Ambulatory Visit: Payer: Self-pay | Admitting: Family Medicine

## 2013-03-23 DIAGNOSIS — R945 Abnormal results of liver function studies: Secondary | ICD-10-CM

## 2013-03-23 NOTE — Telephone Encounter (Signed)
Patient notified of lab results by telephone. 

## 2013-03-23 NOTE — Telephone Encounter (Signed)
PT states that she would like you to call her back and if your unable to reach her, you may leave a detailed message. Please assist.

## 2013-04-01 NOTE — Consult Note (Signed)
Seen and agreed. Venice Liz, MD 

## 2013-04-06 ENCOUNTER — Encounter: Payer: Self-pay | Admitting: Internal Medicine

## 2013-04-06 ENCOUNTER — Ambulatory Visit (INDEPENDENT_AMBULATORY_CARE_PROVIDER_SITE_OTHER): Payer: Managed Care, Other (non HMO) | Admitting: Internal Medicine

## 2013-04-06 VITALS — BP 114/74 | HR 76 | Temp 98.1°F | Wt 141.0 lb

## 2013-04-06 DIAGNOSIS — F319 Bipolar disorder, unspecified: Secondary | ICD-10-CM

## 2013-04-06 DIAGNOSIS — R7989 Other specified abnormal findings of blood chemistry: Secondary | ICD-10-CM

## 2013-04-06 DIAGNOSIS — R109 Unspecified abdominal pain: Secondary | ICD-10-CM

## 2013-04-06 DIAGNOSIS — F411 Generalized anxiety disorder: Secondary | ICD-10-CM

## 2013-04-06 DIAGNOSIS — R945 Abnormal results of liver function studies: Secondary | ICD-10-CM | POA: Insufficient documentation

## 2013-04-06 HISTORY — DX: Other specified abnormal findings of blood chemistry: R79.89

## 2013-04-06 LAB — HEPATIC FUNCTION PANEL
ALT: 22 U/L (ref 0–35)
Bilirubin, Direct: 0.1 mg/dL (ref 0.0–0.3)
Total Protein: 6.9 g/dL (ref 6.0–8.3)

## 2013-04-06 NOTE — Progress Notes (Signed)
Chief Complaint  Patient presents with  . Follow-up    HPI: Patient comes in today for SDA for  problem evaluation. Actually she just wants to update about what is going on and perhaps get her blood work early. Doesn't think this is an IC but doing renal US.  Then sept 10th  And then going to [facility].  For motility clinic.  Taking urobel and helps. Some  improvement in her lower abdominal pain is not going to have urologic procedure but a renal ultrasound concern that Pain in back. Could be her kidneys and cysts.  Uncertain if Dr. Brendia Sacks got a copy of the laboratory tests that we did at the end of July. Worries that the mildly abnormal liver tests could be from a new medicine. Did have some alcohol but she doesn't initially have beer or up to 3 recently with a friend's celebration. But not on a regular basis has wine 2-3 times a week.   ROS: See pertinent positives and negatives per HPI.  Past Medical History  Diagnosis Date  . Bipolar disease, chronic     hosp Jan 13 [facility] depression borderline dependent traits   . Allergic rhinitis   . Headache(784.0)   . S/P total hysterectomy     hx of endometriosis  . History of being hospitalized 11-10    [facility], psych  . IBS (irritable bowel syndrome)     GI evaluation for pain IBS dx, Dr. Loreta Ave  . Chest pain 11/29/12     x 30 min last week, pressure that resolved, then returned- no medical care sought    Family History  Problem Relation Age of Onset  . Sleep apnea Father   . Migraines Sister     headaches  . Other Mother     MAC infection    History   Social History  . Marital Status: Single    Spouse Name: N/A    Number of Children: N/A  . Years of Education: N/A   Social History Main Topics  . Smoking status: Former Games developer  . Smokeless tobacco: Never Used  . Alcohol Use: 1.2 oz/week    2 Cans of beer per week     Comment: socially   . Drug Use: No  . Sexual Activity: None   Other Topics Concern  . 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    no tobacco    Outpatient Encounter Prescriptions as of 04/06/2013  Medication Sig Dispense Refill  . lithium 300 MG tablet Take 900 mg by mouth at bedtime.       . Lurasidone HCl (LATUDA) 20 MG TABS Take 1 tablet by mouth at bedtime.      . Meth-Hyo-M Bl-Na Phos-Ph Sal (URO BLUE PO) Take by mouth.      . sulfamethoxazole-trimethoprim (BACTRIM DS,SEPTRA DS) 800-160 MG per tablet Take 1 tablet by mouth 2 (two) times daily.  14 tablet  0  . zolpidem (AMBIEN) 10 MG tablet Take 5 mg by mouth at bedtime as needed for sleep.       . [DISCONTINUED] phenazopyridine (PYRIDIUM) 200 MG tablet Take 1 tablet (200 mg total) by mouth 3 (three) times daily as needed for pain.  21 tablet  0   No facility-administered encounter medications on file as of 04/06/2013.    EXAM:  BP 114/74  Pulse 76  Temp(Src) 98.1 F (36.7 C) (Oral)  Wt 141 lb (63.957 kg)  BMI 27.54 kg/m2  SpO2 98%  Body mass index is 27.54 kg/(m^2).  GENERAL: vitals reviewed and listed above, alert, oriented, appears well hydrated and in no acute distress  HEENT: atraumatic, conjunctiva  clear, no obvious abnormalities on inspection of external nose and ears  NECK: no obvious masses on inspection palpation  LUNGS: clear to auscultation bilaterally, no wheezes, rales or rhonchi, good air movement CV: HRRR, no clubbing cyanosis or  peripheral edema nl cap refill  Abdomen soft without hepatomegaly guarding or rebound MS: moves all extremities without noticeable focal  abnormality PSYCH: pleasant and cooperative,  ASSESSMENT AND PLAN:  Discussed the following assessment and plan:  Abnormal LFTs - Very minor tried to reassure okay to repeat today with hepatitis C screen - Plan: Hepatitis C antibody, Hepatic function panel  DISORDER, BIPOLAR  NOS  Abdominal  pain, other specified site - Extensive workup including exploratory laparoscopy. Some improvement with urologic antispasmodic going for motility studies.  Anxiety state, unspecified - Tends to somaticize seems okay today discussed less testing as best she seems to be back to work  -Patient advised to return or notify health care team  if symptoms worsen or persist or new concerns arise.  Patient Instructions  Will fax lab results to  Dr Donell Beers from last visit and let you know about the liver tests.      Neta Mends. Aniza Shor M.D.

## 2013-04-06 NOTE — Patient Instructions (Addendum)
Will fax lab results to  Dr Donell Beers from last visit and let you know about the liver tests.

## 2013-04-08 ENCOUNTER — Encounter: Payer: Self-pay | Admitting: Family Medicine

## 2013-04-27 DIAGNOSIS — R6881 Early satiety: Secondary | ICD-10-CM

## 2013-04-27 HISTORY — DX: Early satiety: R68.81

## 2013-04-29 ENCOUNTER — Ambulatory Visit (INDEPENDENT_AMBULATORY_CARE_PROVIDER_SITE_OTHER): Payer: Managed Care, Other (non HMO) | Admitting: Family Medicine

## 2013-04-29 ENCOUNTER — Encounter: Payer: Self-pay | Admitting: Family Medicine

## 2013-04-29 VITALS — BP 114/62 | HR 75 | Temp 98.3°F | Wt 142.0 lb

## 2013-04-29 DIAGNOSIS — L309 Dermatitis, unspecified: Secondary | ICD-10-CM

## 2013-04-29 DIAGNOSIS — L259 Unspecified contact dermatitis, unspecified cause: Secondary | ICD-10-CM

## 2013-04-29 MED ORDER — TRIAMCINOLONE ACETONIDE 0.1 % EX CREA: TOPICAL_CREAM | Freq: Two times a day (BID) | CUTANEOUS | Status: DC

## 2013-04-29 NOTE — Progress Notes (Signed)
  Subjective:    Patient ID: [name], female    DOB: Dec 02, 1960, 52 y.o.   MRN: [id number]  HPI Here for one week of itchy red spots on both lower legs. Otherwise she feels fine. No new meds recently.   Review of Systems  Constitutional: Negative.   Skin: Positive for rash.       Objective:   Physical Exam  Constitutional: She appears well-developed and well-nourished.  Skin:  Both lower legs have scattered red scaly spots          Assessment & Plan:  Recheck prn

## 2013-05-24 ENCOUNTER — Other Ambulatory Visit: Payer: Self-pay | Admitting: Urology

## 2013-05-27 ENCOUNTER — Encounter (HOSPITAL_BASED_OUTPATIENT_CLINIC_OR_DEPARTMENT_OTHER): Payer: Self-pay | Admitting: *Deleted

## 2013-05-31 ENCOUNTER — Encounter (HOSPITAL_BASED_OUTPATIENT_CLINIC_OR_DEPARTMENT_OTHER): Payer: Self-pay | Admitting: *Deleted

## 2013-05-31 NOTE — Progress Notes (Signed)
NPO AFTER MN.  ARRIVE AT 0830.  NEEDS HG. 

## 2013-06-03 ENCOUNTER — Encounter (HOSPITAL_BASED_OUTPATIENT_CLINIC_OR_DEPARTMENT_OTHER): Payer: Self-pay | Admitting: *Deleted

## 2013-06-03 ENCOUNTER — Encounter (HOSPITAL_BASED_OUTPATIENT_CLINIC_OR_DEPARTMENT_OTHER)
Admission: RE | Disposition: A | Discharge status: Home / Self Care | Payer: Self-pay | Source: Ambulatory Visit | Attending: Urology

## 2013-06-03 ENCOUNTER — Ambulatory Visit (HOSPITAL_BASED_OUTPATIENT_CLINIC_OR_DEPARTMENT_OTHER)
Admission: RE | Admit: 2013-06-03 | Discharge: 2013-06-03 | Disposition: A | Discharge status: Home / Self Care | Payer: Self-pay | Source: Ambulatory Visit | Attending: Urology | Admitting: Urology

## 2013-06-03 ENCOUNTER — Encounter (HOSPITAL_BASED_OUTPATIENT_CLINIC_OR_DEPARTMENT_OTHER): Payer: Self-pay | Admitting: Anesthesiology

## 2013-06-03 ENCOUNTER — Ambulatory Visit (HOSPITAL_BASED_OUTPATIENT_CLINIC_OR_DEPARTMENT_OTHER): Payer: Managed Care, Other (non HMO) | Admitting: Anesthesiology

## 2013-06-03 DIAGNOSIS — Q619 Cystic kidney disease, unspecified: Secondary | ICD-10-CM | POA: Insufficient documentation

## 2013-06-03 DIAGNOSIS — Z87891 Personal history of nicotine dependence: Secondary | ICD-10-CM | POA: Insufficient documentation

## 2013-06-03 DIAGNOSIS — M549 Dorsalgia, unspecified: Secondary | ICD-10-CM | POA: Insufficient documentation

## 2013-06-03 DIAGNOSIS — R0602 Shortness of breath: Secondary | ICD-10-CM | POA: Insufficient documentation

## 2013-06-03 DIAGNOSIS — M545 Low back pain, unspecified: Secondary | ICD-10-CM | POA: Insufficient documentation

## 2013-06-03 DIAGNOSIS — Z90710 Acquired absence of both cervix and uterus: Secondary | ICD-10-CM | POA: Insufficient documentation

## 2013-06-03 DIAGNOSIS — R351 Nocturia: Secondary | ICD-10-CM | POA: Insufficient documentation

## 2013-06-03 DIAGNOSIS — N949 Unspecified condition associated with female genital organs and menstrual cycle: Secondary | ICD-10-CM | POA: Insufficient documentation

## 2013-06-03 DIAGNOSIS — N390 Urinary tract infection, site not specified: Secondary | ICD-10-CM | POA: Insufficient documentation

## 2013-06-03 DIAGNOSIS — Z79899 Other long term (current) drug therapy: Secondary | ICD-10-CM | POA: Insufficient documentation

## 2013-06-03 DIAGNOSIS — Z8711 Personal history of peptic ulcer disease: Secondary | ICD-10-CM | POA: Insufficient documentation

## 2013-06-03 DIAGNOSIS — R002 Palpitations: Secondary | ICD-10-CM | POA: Insufficient documentation

## 2013-06-03 DIAGNOSIS — F319 Bipolar disorder, unspecified: Secondary | ICD-10-CM | POA: Insufficient documentation

## 2013-06-03 HISTORY — DX: Adverse effect of unspecified anesthetic, initial encounter: T41.45XA

## 2013-06-03 HISTORY — DX: Pelvic and perineal pain unspecified side: R10.20

## 2013-06-03 HISTORY — DX: Other complications of anesthesia, initial encounter: T88.59XA

## 2013-06-03 HISTORY — DX: Bipolar disorder, unspecified: F31.9

## 2013-06-03 HISTORY — DX: Personality disorder, unspecified: F60.9

## 2013-06-03 HISTORY — DX: Frequency of micturition: R35.0

## 2013-06-03 HISTORY — DX: Urgency of urination: R39.15

## 2013-06-03 HISTORY — DX: Pelvic and perineal pain: R10.2

## 2013-06-03 HISTORY — DX: Personal history of other diseases of the digestive system: Z87.19

## 2013-06-03 HISTORY — DX: Nocturia: R35.1

## 2013-06-03 HISTORY — PX: CYSTOSCOPY WITH BIOPSY: SHX5122

## 2013-06-03 SURGERY — CYSTOSCOPY, WITH BIOPSY
Anesthesia: General | Site: Bladder | Wound class: Clean Contaminated

## 2013-06-03 MED ORDER — FENTANYL CITRATE 0.05 MG/ML IJ SOLN
25.0000 ug | INTRAMUSCULAR | Status: DC | PRN
  Filled 2013-06-03: qty 1

## 2013-06-03 MED ORDER — HYDROCODONE-ACETAMINOPHEN 5-325 MG PO TABS: 1.0000 | ORAL_TABLET | Freq: Four times a day (QID) | ORAL | Status: DC | PRN

## 2013-06-03 MED ORDER — TRAMADOL HCL 50 MG PO TABS
50.0000 mg | ORAL_TABLET | Freq: Four times a day (QID) | ORAL | Status: DC | PRN
  Administered 2013-06-03: 50 mg via ORAL
  Filled 2013-06-03: qty 1

## 2013-06-03 MED ORDER — STERILE WATER FOR IRRIGATION IR SOLN
Status: DC | PRN
  Administered 2013-06-03: 1

## 2013-06-03 MED ORDER — PROPOFOL 10 MG/ML IV BOLUS
INTRAVENOUS | Status: DC | PRN
  Administered 2013-06-03: 200 mg via INTRAVENOUS

## 2013-06-03 MED ORDER — PHENAZOPYRIDINE HCL 200 MG PO TABS
ORAL | Status: DC | PRN
  Administered 2013-06-03: 10:00:00 via INTRAVESICAL

## 2013-06-03 MED ORDER — ONDANSETRON HCL 4 MG/2ML IJ SOLN
INTRAMUSCULAR | Status: DC | PRN
  Administered 2013-06-03: 4 mg via INTRAMUSCULAR

## 2013-06-03 MED ORDER — LACTATED RINGERS IV SOLN
INTRAVENOUS | Status: DC
  Administered 2013-06-03: 09:00:00 via INTRAVENOUS
  Filled 2013-06-03: qty 1000

## 2013-06-03 MED ORDER — LACTATED RINGERS IV SOLN
INTRAVENOUS | Status: DC
  Filled 2013-06-03: qty 1000

## 2013-06-03 MED ORDER — LIDOCAINE HCL (CARDIAC) 20 MG/ML IV SOLN
INTRAVENOUS | Status: DC | PRN
  Administered 2013-06-03: 80 mg via INTRAVENOUS

## 2013-06-03 MED ORDER — DEXAMETHASONE SODIUM PHOSPHATE 4 MG/ML IJ SOLN
INTRAMUSCULAR | Status: DC | PRN
  Administered 2013-06-03: 10 mg via INTRAVENOUS

## 2013-06-03 MED ORDER — MIDAZOLAM HCL 5 MG/5ML IJ SOLN
INTRAMUSCULAR | Status: DC | PRN
  Administered 2013-06-03: 1 mg via INTRAVENOUS

## 2013-06-03 MED ORDER — CEFAZOLIN SODIUM 1-5 GM-% IV SOLN
1.0000 g | INTRAVENOUS | Status: DC
  Filled 2013-06-03: qty 50

## 2013-06-03 MED ORDER — ACETAMINOPHEN 10 MG/ML IV SOLN
INTRAVENOUS | Status: DC | PRN
  Administered 2013-06-03: 1000 mg via INTRAVENOUS

## 2013-06-03 MED ORDER — FENTANYL CITRATE 0.05 MG/ML IJ SOLN
INTRAMUSCULAR | Status: DC | PRN
  Administered 2013-06-03 (×2): 50 ug via INTRAVENOUS

## 2013-06-03 MED ORDER — CEFAZOLIN SODIUM-DEXTROSE 2-3 GM-% IV SOLR
2.0000 g | INTRAVENOUS | Status: AC
  Administered 2013-06-03: 2 g via INTRAVENOUS
  Filled 2013-06-03: qty 50

## 2013-06-03 MED ORDER — KETOROLAC TROMETHAMINE 30 MG/ML IJ SOLN
INTRAMUSCULAR | Status: DC | PRN
  Administered 2013-06-03: 30 mg via INTRAVENOUS

## 2013-06-03 MED ORDER — SODIUM CHLORIDE 0.9 % IR SOLN
Status: DC | PRN
  Administered 2013-06-03: 6000 mL via INTRAVESICAL

## 2013-06-03 SURGICAL SUPPLY — 22 items
BAG DRAIN URO-CYSTO SKYTR STRL (DRAIN) ×2 IMPLANT
BAG URINE LEG 19OZ MD ST LTX (BAG) ×2 IMPLANT
CANISTER SUCT LVC 12 LTR MEDI- (MISCELLANEOUS) ×2 IMPLANT
CATH FOLEY 2WAY SLVR  5CC 16FR (CATHETERS) ×1
CATH FOLEY 2WAY SLVR 5CC 16FR (CATHETERS) ×1 IMPLANT
CLOTH BEACON ORANGE TIMEOUT ST (SAFETY) ×2 IMPLANT
DRAPE CAMERA CLOSED 9X96 (DRAPES) ×2 IMPLANT
ELECT REM PT RETURN 9FT ADLT (ELECTROSURGICAL) ×2
ELECTRODE REM PT RTRN 9FT ADLT (ELECTROSURGICAL) ×1 IMPLANT
GLOVE BIO SURGEON STRL SZ 6.5 (GLOVE) ×2 IMPLANT
GLOVE BIO SURGEON STRL SZ7 (GLOVE) ×4 IMPLANT
GLOVE INDICATOR 7.0 STRL GRN (GLOVE) ×2 IMPLANT
GOWN PREVENTION PLUS LG XLONG (DISPOSABLE) ×2 IMPLANT
HOLDER FOLEY CATH W/STRAP (MISCELLANEOUS) ×2 IMPLANT
IV NS IRRIG 3000ML ARTHROMATIC (IV SOLUTION) ×4 IMPLANT
NDL SAFETY ECLIPSE 18X1.5 (NEEDLE) ×1 IMPLANT
NEEDLE HYPO 18GX1.5 SHARP (NEEDLE) ×1
NEEDLE HYPO 22GX1.5 SAFETY (NEEDLE) IMPLANT
NS IRRIG 500ML POUR BTL (IV SOLUTION) IMPLANT
PACK CYSTOSCOPY (CUSTOM PROCEDURE TRAY) ×4 IMPLANT
SYR 20CC LL (SYRINGE) ×2 IMPLANT
WATER STERILE IRR 3000ML UROMA (IV SOLUTION) IMPLANT

## 2013-06-03 NOTE — H&P (Signed)
  History of Present Illness     Ms Hagberg returns for follow-up. She still complains of moderate to severe pelvic pain.  She also has low back pain.  She denies dysuria, hematuria. She has been treated with Macrobid and Bactrim D/S for UTI.  She states that 4-5 days after completion of antibiotics her symptoms usually recur.  She has nocturia several times and she has difficulty sleeping. CT scan in March revealed moderate dilation of the collecting system on the right without definite cause of obstruction.   Past Medical History Problems  1. History of  Peptic Ulcer V12.71  Surgical History Problems  1. History of  Hysterectomy V45.77  Current Meds 1. Ambien TABS; Therapy: (Recorded:08Aug2014) to 2. Latuda TABS; Therapy: (Recorded:08Aug2014) to 3. Lithium Carbonate 600 MG Oral Capsule; Therapy: (Recorded:12Aug2008) to 4. Sulfamethoxazole-TMP DS 800-160 MG Oral Tablet; TAKE 1 TABLET TWICE DAILY UNTIL  FINISHED; Therapy: 21Sep2014 to (Evaluate:28Sep2014)  Requested for: 21Sep2014; Last  Rx:21Sep2014  Allergies Medication  1. No Known Drug Allergies  Family History Problems  1. Maternal history of  Death In The Family Father Deceased at age 64 2. Maternal history of  Family Health Status - Mother's Age Age 19 3. Paternal history of  Malignant Neoplasm Of The Thyroid Cartilage V16.2 4. Sororal history of  Migraine Headache 5. Maternal history of  Osteoporosis V17.81  Social History Problems  1. Alcohol Use 1-3 a week 2. Caffeine Use 1 coffee 4 times a week 3. Former Smoker V15.82 4. Marital History - Single  Review of Systems Genitourinary, constitutional, skin, eye, otolaryngeal, hematologic/lymphatic, cardiovascular, pulmonary, endocrine, musculoskeletal, gastrointestinal, neurological and psychiatric system(s) were reviewed and pertinent findings if present are noted.  Genitourinary: urinary frequency, dysuria and nocturia.    Physical Exam Constitutional: Well  nourished and well developed . No acute distress.  ENT:. The ears and nose are normal in appearance.  Neck: The appearance of the neck is normal and no neck mass is present.  Pulmonary: No respiratory distress and normal respiratory rhythm and effort.  Cardiovascular: Heart rate and rhythm are normal . No peripheral edema.  Abdomen: The abdomen is soft and nontender. No masses are palpated. No CVA tenderness. No hernias are palpable. No hepatosplenomegaly noted.  Genitourinary:  Vaginal exam demonstrates no abnormalities. The adnexa are palpably normal. The bladder is non tender and not distended.  Lymphatics: The femoral and inguinal nodes are not enlarged or tender.  Skin: Normal skin turgor, no visible rash and no visible skin lesions.  Neuro/Psych:. Mood and affect are appropriate.    Results/Data Urine [Data Includes: Last 1 Day]   06Oct2014  COLOR YELLOW   APPEARANCE CLEAR   SPECIFIC GRAVITY 1.020   pH 6.0   GLUCOSE NEG mg/dL  BILIRUBIN NEG   KETONE NEG mg/dL  BLOOD NEG   PROTEIN NEG mg/dL  UROBILINOGEN 0.2 mg/dL  NITRITE NEG   LEUKOCYTE ESTERASE MOD   SQUAMOUS EPITHELIAL/HPF FEW   WBC 7-10 WBC/hpf  RBC 0-2 RBC/hpf  BACTERIA FEW   CRYSTALS NONE SEEN   CASTS NONE SEEN    Assessment Assessed  1. Female Pelvic Pain 625.9 2. Nocturia 788.43  Plan Female Pelvic Pain (625.9)  1. Follow-up Schedule Surgery Office  Follow-up  Requested for: 06Oct2014   Cystoscopy under anesthesia.  Will also biopsy the bladder.  She understands that I may not find on cystoscopy the cause of her pain.

## 2013-06-03 NOTE — Op Note (Signed)
[name] is a 52 y.o.   06/03/2013  General  Pre-op diagnosis: Back and pelvic pain.  Postop diagnosis: Same  Procedure done: Cystoscopy, HOD, bladder biopsy, meatal dilation, instillation of Marcaine and Pyridium and insertion of Foley catheter  Surgeon: Wendie Simmer. Leauna Sharber  Anesthesia: General  Indication:  Patient is a 52 years old female who has been complaining for the past several months of  back and pelvic pain. She was treated with Macrobid and Bactrim DS for urinary tract infection. Her symptoms have persisted. She had a CT scan of the abdomen and pelvis in March 2014 and a CT angiogram of the abdomen in April that revealed no hydronephrosis. She has small bilateral cortical renal cysts. She is scheduled today for cystoscopy and bladder distention.  Procedure:  The patient was identified by her wrist band and proper timeout was done.  A panendoscope was inserted in the bladder. The bladder mucosa is normal. There is no stone or tumor in the bladder. The ureteral orifices are in normal position and shape. The bladder was then distended for 5 minutes and after emptying the bladder there were several glomerulations in the bladder mainly at the bladder base. The bladder capacity is 800 cc. Then a random bladder biopsy was done with the cold cup forceps. The areas of biopsy were then fulgurated with the Bugbee electrode. There was no evidence of bleeding at the end of the procedure. The bladder was emptied and the cystoscope removed. The meatus was then dilated up to #30 [place]. A #16 French Foley catheter was then inserted in the bladder and the 400 mg of Pyridium in 30 cc of 0.5% Marcaine were instilled in the bladder. The Foley catheter was left indwelling.  The patient tolerated the procedure well and left the OR in satisfactory condition to postanesthesia care unit.  EBL: None

## 2013-06-03 NOTE — Anesthesia Preprocedure Evaluation (Addendum)
Anesthesia Evaluation  Patient identified by MRN, date of birth, ID band Patient awake    Reviewed: Allergy & Precautions, H&P , NPO status , Patient's Chart, lab work & pertinent test results  History of Anesthesia Complications (+) Emergence Delirium  Airway Mallampati: II TM Distance: >3 FB Neck ROM: full    Dental no notable dental hx. (+) Teeth Intact and Dental Advisory Given   Pulmonary neg pulmonary ROS, shortness of breath and with exertion,  breath sounds clear to auscultation  Pulmonary exam normal       Cardiovascular Exercise Tolerance: Good negative cardio ROS  Rhythm:regular Rate:Normal  History palpitations   Neuro/Psych  Headaches, Anxiety Bipolar Disorder History psychosis.  Borderline personalitynegative neurological ROS  negative psych ROS   GI/Hepatic negative GI ROS, Neg liver ROS,   Endo/Other  negative endocrine ROS  Renal/GU negative Renal ROS  negative genitourinary   Musculoskeletal   Abdominal   Peds  Hematology negative hematology ROS (+)   Anesthesia Other Findings   Reproductive/Obstetrics negative OB ROS                          Anesthesia Physical Anesthesia Plan  ASA: II  Anesthesia Plan: General   Post-op Pain Management:    Induction: Intravenous  Airway Management Planned: LMA  Additional Equipment:   Intra-op Plan:   Post-operative Plan:   Informed Consent: I have reviewed the patients History and Physical, chart, labs and discussed the procedure including the risks, benefits and alternatives for the proposed anesthesia with the patient or authorized representative who has indicated his/her understanding and acceptance.   Dental Advisory Given  Plan Discussed with: CRNA and Surgeon  Anesthesia Plan Comments:         Anesthesia Quick Evaluation

## 2013-06-03 NOTE — Transfer of Care (Signed)
Immediate Anesthesia Transfer of Care Note  Patient: [name]  Procedure(s) Performed: Procedure(s): CYSTOSCOPY WITH BIOPSY  INSTILLATION OF MARCAINE AND PYRIDIUM (N/A)  Patient Location: PACU  Anesthesia Type:General  Level of Consciousness: awake, alert  and oriented  Airway & Oxygen Therapy: Patient Spontanous Breathing and Patient connected to nasal cannula oxygen  Post-op Assessment: Report given to PACU RN  Post vital signs: Reviewed and stable  Complications: No apparent anesthesia complications

## 2013-06-03 NOTE — Anesthesia Procedure Notes (Signed)
Procedure Name: LMA Insertion Date/Time: 06/03/2013 10:07 AM Performed by: Maris Berger T Pre-anesthesia Checklist: Patient identified, Emergency Drugs available, Suction available and Patient being monitored Patient Re-evaluated:Patient Re-evaluated prior to inductionOxygen Delivery Method: Circle System Utilized Preoxygenation: Pre-oxygenation with 100% oxygen Intubation Type: IV induction Ventilation: Mask ventilation without difficulty LMA: LMA inserted LMA Size: 4.0 Number of attempts: 1 Airway Equipment and Method: bite block Placement Confirmation: positive ETCO2 Dental Injury: Teeth and Oropharynx as per pre-operative assessment

## 2013-06-04 LAB — URINE CULTURE

## 2013-06-04 NOTE — Anesthesia Postprocedure Evaluation (Signed)
  Anesthesia Post-op Note  Patient: [name]  Procedure(s) Performed: Procedure(s) (LRB): CYSTOSCOPY WITH BIOPSY  INSTILLATION OF MARCAINE AND PYRIDIUM (N/A)  Patient Location: PACU  Anesthesia Type: General  Level of Consciousness: awake and alert   Airway and Oxygen Therapy: Patient Spontanous Breathing  Post-op Pain: mild  Post-op Assessment: Post-op Vital signs reviewed, Patient's Cardiovascular Status Stable, Respiratory Function Stable, Patent Airway and No signs of Nausea or vomiting  Last Vitals:  Filed Vitals:   06/03/13 1306  BP: 120/79  Pulse: 79  Temp: 36.1 C  Resp: 16    Post-op Vital Signs: stable   Complications: No apparent anesthesia complications

## 2013-06-06 ENCOUNTER — Encounter (HOSPITAL_BASED_OUTPATIENT_CLINIC_OR_DEPARTMENT_OTHER): Payer: Self-pay | Admitting: Urology

## 2013-08-09 ENCOUNTER — Telehealth: Payer: Self-pay | Admitting: Internal Medicine

## 2013-08-09 NOTE — Telephone Encounter (Signed)
Patient Information:  Caller Name: Carlee  Phone: [phone]  Patient: [name]  Gender: Female  DOB: [birth year]  Age: 52 Years  PCP: Berniece Andreas (Family Practice)  Pregnant: No  Office Follow Up:  Does the office need to follow up with this patient?: No  Instructions For The Office: N/A  RN Note:  Pt. informed that all the appts. are full, but she does not want to go to an Urgent Care. Will call the office to see if there is a cancellation.  Symptoms  Reason For Call & Symptoms: Onset 08/09/13 with pain in the back on the upper Rt. side. Hurts when she takes a deep breath. Does not have her Gallbladder in.  Reviewed Health History In EMR: Yes  Reviewed Medications In EMR: Yes  Reviewed Allergies In EMR: Yes  Reviewed Surgeries / Procedures: Yes  Date of Onset of Symptoms: 08/09/2013 OB / GYN:  LMP: Unknown  Guideline(s) Used:  Back Pain  Disposition Per Guideline:   See Today or Tomorrow in Office  Reason For Disposition Reached:   Age > 50 and no history of prior similar back pain  Advice Given:  Call Back If:  Numbness or weakness occur  Bowel/bladder problems occur  Pain lasts for more than 2 weeks  Patient Will Follow Care Advice:  YES  Appointment Scheduled:  08/10/2013 12:30:00 Appointment Scheduled Provider:  Berniece Andreas (Family Practice)

## 2013-08-10 ENCOUNTER — Encounter: Payer: Self-pay | Admitting: Internal Medicine

## 2013-08-10 NOTE — Progress Notes (Signed)
Document opened and reviewed for OV but appt  canceled same day .  

## 2013-09-02 ENCOUNTER — Encounter: Payer: Self-pay | Admitting: Internal Medicine

## 2013-09-02 ENCOUNTER — Ambulatory Visit (INDEPENDENT_AMBULATORY_CARE_PROVIDER_SITE_OTHER): Payer: BC Managed Care – PPO | Admitting: Internal Medicine

## 2013-09-02 VITALS — BP 118/74 | Temp 98.5°F | Ht 60.25 in | Wt 147.0 lb

## 2013-09-02 DIAGNOSIS — R5381 Other malaise: Secondary | ICD-10-CM

## 2013-09-02 DIAGNOSIS — R3989 Other symptoms and signs involving the genitourinary system: Secondary | ICD-10-CM

## 2013-09-02 DIAGNOSIS — E559 Vitamin D deficiency, unspecified: Secondary | ICD-10-CM

## 2013-09-02 DIAGNOSIS — R209 Unspecified disturbances of skin sensation: Secondary | ICD-10-CM

## 2013-09-02 DIAGNOSIS — Z79899 Other long term (current) drug therapy: Secondary | ICD-10-CM

## 2013-09-02 DIAGNOSIS — R202 Paresthesia of skin: Secondary | ICD-10-CM

## 2013-09-02 DIAGNOSIS — R5383 Other fatigue: Secondary | ICD-10-CM

## 2013-09-02 DIAGNOSIS — F319 Bipolar disorder, unspecified: Secondary | ICD-10-CM

## 2013-09-02 LAB — BASIC METABOLIC PANEL
BUN: 13 mg/dL (ref 6–23)
CALCIUM: 10 mg/dL (ref 8.4–10.5)
CO2: 25 meq/L (ref 19–32)
CREATININE: 0.8 mg/dL (ref 0.4–1.2)
Chloride: 108 mEq/L (ref 96–112)
GFR: 80.16 mL/min (ref >60.00)
Glucose, Bld: 82 mg/dL (ref 70–99)
Potassium: 4.1 mEq/L (ref 3.5–5.1)
Sodium: 140 mEq/L (ref 135–145)

## 2013-09-02 LAB — TSH: TSH: 1.33 u[IU]/mL (ref 0.35–5.50)

## 2013-09-02 LAB — T4, FREE: Free T4: 0.66 ng/dL (ref 0.60–1.60)

## 2013-09-02 NOTE — Patient Instructions (Addendum)
Ok to do tsh and bmp. Monitoring  For lithium.  Keep your psychiatrist  Involved. With information.

## 2013-09-02 NOTE — Progress Notes (Signed)
Chief Complaint  Patient presents with  . Follow-up    HPI: Patient comes in today for follow up of  multiple medical problems.   Wants to update  What is going on . Had cystocscopy done fall December neg bx x for inflammation poss interstitial cystitis and los job in September  And appealing it . Missing work .  Never followed up for gastric emptying and lost heath insurance .  Psychiatrist  Sees . Dr Casimiro Needle no change lithium and Lorrin Mais only gets some sleep walking eating hasnt told psych .  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 Feels washed out  .  Has obama care now  baldder pain gi issue  reglan helps but makes her manic . Chest full of lfuid but no fever edema applying for disability Still has tingling and numbness around face and other areas. Thinking of going back to neuro dr Gorden Harms .  Health Maintenance  Topic Date Due  . Influenza Vaccine  04/29/2014  . Pap Smear  12/01/2013  . Mammogram  01/01/2015  . Tetanus/tdap  06/14/2019  . Colonoscopy  10/11/2022   Health Maintenance Review   ROS: See pertinent positives and negatives per HPI.  Past Medical History  Diagnosis Date  . Headache(784.0)   . IBS (irritable bowel syndrome)   . Bipolar 1 disorder     HX PSYCHOSIS  . Personality disorder     W/ BORDERLINE FEATURES  . History of duodenal ulcer   . Pelvic pain   . Complication of anesthesia     POST AGGITATION  . Frequency of urination   . Urgency of urination   . Nocturia     Family History  Problem Relation Age of Onset  . Sleep apnea Father   . Migraines Sister     headaches  . Other Mother     MAC infection    History   Social History  . Marital Status: Single    Spouse Name: N/A    Number of Children: N/A  . Years of Education: N/A   Social History Main Topics  . Smoking status: Former Research scientist (life sciences)  . Smokeless tobacco: Never Used     Comment: ONLY SMOKED FOR 6 MONTHS --  QUIT  YRS AGO  . Alcohol Use: 1.8 oz/week    3 Cans of beer per week     Comment:  socially   . Drug Use: No  . Sexual Activity: None   Other Topics Concern  . 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    no tobacco    Outpatient Encounter Prescriptions as of 09/02/2013  Medication Sig  . lithium 300 MG tablet Take 750 mg by mouth at bedtime.   [name] Kitchen zolpidem (AMBIEN) 10 MG tablet Take 5 mg by mouth at bedtime as needed for sleep.   . magnesium hydroxide (MILK OF MAGNESIA) 400 MG/5ML suspension Take by mouth daily as needed for constipation.  . [DISCONTINUED] Lurasidone HCl (LATUDA) 20 MG TABS Take 1 tablet by mouth at bedtime. Take 10 mg  . [DISCONTINUED] traMADol (ULTRAM) 50 MG tablet Take 50 mg by mouth every 6 (six) hours as needed for pain.    EXAM:  BP 118/74  Temp(Src) 98.5 F (36.9 C) (Oral)  Ht 5' 0.25" (1.53 m)  Wt 147 lb (66.679 kg)  BMI 28.48 kg/m2  Body mass index is 28.48 kg/(m^2).  GENERAL: vitals reviewed and listed above, alert, oriented, appears well hydrated and in no acute distress LUNGS: clear to auscultation bilaterally, no wheezes, rales or rhonchi, good air movement CV: HRRR, no clubbing cyanosis or  peripheral edema nl cap refill  MS: moves all extremities without noticeable focal  abnormality PSYCH: pleasant and cooperative,no tremor   ASSESSMENT AND PLAN:  Discussed the following assessment and plan:  DEFICIENCY, VITAMIN D NOS - Plan: TSH, T4, free, Basic metabolic panel, Vit D  25 hydroxy (rtn osteoporosis monitoring)  DISORDER, BIPOLAR NOS - Plan: TSH, T4, free, Basic metabolic panel, Vit D  25 hydroxy (rtn osteoporosis monitoring)  FATIGUE - Plan: TSH, T4, free, Basic metabolic panel, Vit D  25 hydroxy (rtn osteoporosis monitoring)  Lithium use - Plan: TSH, T4, free, Basic metabolic panel, Vit D  25 hydroxy (rtn osteoporosis monitoring)  Tingling - Plan: TSH, T4, free,  Basic metabolic panel, Vit D  25 hydroxy (rtn osteoporosis monitoring)  Bladder pain - poss ic  Disc that she tends to somatasize  And this is making her functionality more difficult disc avoiding excess testing and focusing on the? At hand and only test if it will help her .  Not just for information Keep dr Casimiro Needle informed  -Patient advised to return or notify health care team  if symptoms worsen or persist or new concerns arise. Decline flu vaccine  Patient Instructions  Ok to do tsh and bmp. Monitoring  For lithium.  Keep your psychiatrist  Involved. With information.       Standley Brooking. Ernisha Sorn M.D.  Pre visit review using our clinic review tool, if applicable. No additional management support is needed unless otherwise documented below in the visit note. Total visit 83mins > 50% spent counseling and coordinating care

## 2013-09-03 LAB — VITAMIN D 25 HYDROXY (VIT D DEFICIENCY, FRACTURES): Vit D, 25-Hydroxy: 26 ng/mL — ABNORMAL LOW (ref 30–89)

## 2013-09-09 ENCOUNTER — Other Ambulatory Visit: Payer: Self-pay | Admitting: Family Medicine

## 2013-09-13 ENCOUNTER — Emergency Department (HOSPITAL_COMMUNITY): Payer: BC Managed Care – PPO

## 2013-09-13 ENCOUNTER — Telehealth: Payer: Self-pay | Admitting: Neurology

## 2013-09-13 ENCOUNTER — Emergency Department (HOSPITAL_COMMUNITY)
Admission: EM | Admit: 2013-09-13 | Discharge: 2013-09-13 | Disposition: A | Discharge status: Home / Self Care | Payer: BC Managed Care – PPO | Attending: Emergency Medicine | Admitting: Emergency Medicine

## 2013-09-13 ENCOUNTER — Encounter (HOSPITAL_COMMUNITY): Payer: Self-pay | Admitting: Emergency Medicine

## 2013-09-13 DIAGNOSIS — Z8719 Personal history of other diseases of the digestive system: Secondary | ICD-10-CM | POA: Insufficient documentation

## 2013-09-13 DIAGNOSIS — R5381 Other malaise: Secondary | ICD-10-CM | POA: Insufficient documentation

## 2013-09-13 DIAGNOSIS — Z885 Allergy status to narcotic agent status: Secondary | ICD-10-CM | POA: Insufficient documentation

## 2013-09-13 DIAGNOSIS — R351 Nocturia: Secondary | ICD-10-CM | POA: Insufficient documentation

## 2013-09-13 DIAGNOSIS — R531 Weakness: Secondary | ICD-10-CM

## 2013-09-13 DIAGNOSIS — R5383 Other fatigue: Principal | ICD-10-CM

## 2013-09-13 DIAGNOSIS — R350 Frequency of micturition: Secondary | ICD-10-CM | POA: Insufficient documentation

## 2013-09-13 DIAGNOSIS — R209 Unspecified disturbances of skin sensation: Secondary | ICD-10-CM | POA: Insufficient documentation

## 2013-09-13 DIAGNOSIS — Z87891 Personal history of nicotine dependence: Secondary | ICD-10-CM | POA: Insufficient documentation

## 2013-09-13 DIAGNOSIS — R51 Headache: Secondary | ICD-10-CM | POA: Insufficient documentation

## 2013-09-13 DIAGNOSIS — Z79899 Other long term (current) drug therapy: Secondary | ICD-10-CM | POA: Insufficient documentation

## 2013-09-13 DIAGNOSIS — F319 Bipolar disorder, unspecified: Secondary | ICD-10-CM | POA: Insufficient documentation

## 2013-09-13 DIAGNOSIS — F609 Personality disorder, unspecified: Secondary | ICD-10-CM | POA: Insufficient documentation

## 2013-09-13 DIAGNOSIS — K589 Irritable bowel syndrome without diarrhea: Secondary | ICD-10-CM | POA: Insufficient documentation

## 2013-09-13 LAB — COMPREHENSIVE METABOLIC PANEL
ALBUMIN: 4.3 g/dL (ref 3.5–5.2)
ALT: 80 U/L — ABNORMAL HIGH (ref 0–35)
AST: 52 U/L — AB (ref 0–37)
Alkaline Phosphatase: 105 U/L (ref 39–117)
BUN: 9 mg/dL (ref 6–23)
CALCIUM: 10 mg/dL (ref 8.4–10.5)
CO2: 23 mEq/L (ref 19–32)
Chloride: 106 mEq/L (ref 96–112)
Creatinine, Ser: 0.78 mg/dL (ref 0.50–1.10)
GFR calc Af Amer: >90 mL/min (ref >90)
GFR calc non Af Amer: >90 mL/min (ref >90)
Glucose, Bld: 92 mg/dL (ref 70–99)
Potassium: 4.3 mEq/L (ref 3.7–5.3)
Sodium: 143 mEq/L (ref 137–147)
Total Bilirubin: 0.7 mg/dL (ref 0.3–1.2)
Total Protein: 7.7 g/dL (ref 6.0–8.3)

## 2013-09-13 LAB — URINE MICROSCOPIC-ADD ON

## 2013-09-13 LAB — URINALYSIS, ROUTINE W REFLEX MICROSCOPIC
Bilirubin Urine: NEGATIVE
GLUCOSE, UA: NEGATIVE mg/dL
Hgb urine dipstick: NEGATIVE
Ketones, ur: 15 mg/dL — AB
Nitrite: NEGATIVE
PROTEIN: NEGATIVE mg/dL
SPECIFIC GRAVITY, URINE: 1.02 (ref 1.005–1.030)
Urobilinogen, UA: 0.2 mg/dL (ref 0.0–1.0)
pH: 5 (ref 5.0–8.0)

## 2013-09-13 LAB — CBC
HEMATOCRIT: 41.9 % (ref 36.0–46.0)
Hemoglobin: 14.4 g/dL (ref 12.0–15.0)
MCH: 31.3 pg (ref 26.0–34.0)
MCHC: 34.4 g/dL (ref 30.0–36.0)
MCV: 91.1 fL (ref 78.0–100.0)
PLATELETS: 299 10*3/uL (ref 150–400)
RBC: 4.6 MIL/uL (ref 3.87–5.11)
RDW: 12.3 % (ref 11.5–15.5)
WBC: 6.5 10*3/uL (ref 4.0–10.5)

## 2013-09-13 NOTE — ED Provider Notes (Signed)
[name]: [id number]     Arrival date & time 09/13/13  1445 History   First MD Initiated Contact with Patient 09/13/13 1943     Chief Complaint  Patient presents with  . Weakness   (Consider location/radiation/quality/duration/timing/severity/associated sxs/prior Treatment) Patient is a 53 y.o. female presenting with weakness. The history is provided by the patient.  Weakness   She complains of a generalized weak feeling for several weeks. She also had a headache 2 weeks ago, that caused her to sleep for 20 hours. Her major concern today is a numb feeling in both hands, arms, and her jaws. The numbness began yesterday. It has resolved. She is worried that she has MS. She has attempted to schedule an appointment with an MS specialist. She's never been diagnosed with MS. She's never had a stroke, heart attack, brain tumor or meningitis. There are no other known modifying factors   Past Medical History  Diagnosis Date  . Headache(784.0)   . IBS (irritable bowel syndrome)   . Bipolar 1 disorder     HX PSYCHOSIS  . Personality disorder     W/ BORDERLINE FEATURES  . History of duodenal ulcer   . Pelvic pain   . Complication of anesthesia     POST AGGITATION  . Frequency of urination   . Urgency of urination   . Nocturia    Past Surgical History  Procedure Laterality Date  . Laparoscopy N/A 12/14/2012    Procedure: LAPAROSCOPY DIAGNOSTIC;  Surgeon: Stark Klein, MD;  Location: [facility];  Service: General;  Laterality: N/A;  . Laparoscopic left salpingoophorectomy and lysys adhesions  03-10-2002  . Cardiovascular stress test  01-23-2011    NORMAL NUCLEAR STUDY/ NO ISCHEMIA/ EF 81%  . Transthoracic echocardiogram  10-13-2010    NORMAL LVF/  EF 55-60%  . Laparoscopic cholecystectomy  11-08-2001  . Cardiac catheterization  10-09-1999  DR KATZ    NORMAL LVF/  NORMAL RCA/  NO CRITICAL DISEASE LEFT CORONARY SYSTEM  . Laparoscopic removal ovary remnant  2003   [place]  . Total abdominal  hysterectomy  2000    W/ RIGHT SALPINGOOPHORECTOMY  . Cystoscopy with biopsy N/A 06/03/2013    Procedure: CYSTOSCOPY WITH BIOPSY  INSTILLATION OF MARCAINE AND PYRIDIUM;  Surgeon: Hanley Ben, MD;  Location: [facility];  Service: Urology;  Laterality: N/A;   Family History  Problem Relation Age of Onset  . Sleep apnea Father   . Migraines Sister     headaches  . Other Mother     MAC infection   History  Substance Use Topics  . Smoking status: Former Research scientist (life sciences)  . Smokeless tobacco: Never Used     Comment: ONLY SMOKED FOR 6 MONTHS --  QUIT  YRS AGO  . Alcohol Use: 1.8 oz/week    3 Cans of beer per week     Comment: socially    OB History   Grav Para Term Preterm Abortions TAB SAB Ect Mult Living                 Review of Systems  Neurological: Positive for weakness.  All other systems reviewed and are negative.    Allergies  Morphine and related  Home Medications   Current Outpatient Rx  Name  Route  Sig  Dispense  Refill  . Cholecalciferol (VITAMIN D-3) 1000 UNITS CAPS   Oral   Take 2,000 Units by mouth daily.         [name] Kitchen gabapentin (NEURONTIN) 300 MG  capsule   Oral   Take 300 mg by mouth 3 (three) times daily as needed (for pain).         . hydrOXYzine (VISTARIL) 25 MG capsule   Oral   Take 25 mg by mouth 3 (three) times daily as needed (for bladder control).         [name] Kitchen lithium 300 MG tablet   Oral   Take 300 mg by mouth at bedtime.          . Multiple Vitamin (MULTIVITAMIN WITH MINERALS) TABS tablet   Oral   Take 1 tablet by mouth daily.         [name] Kitchen zolpidem (AMBIEN) 10 MG tablet   Oral   Take 5 mg by mouth at bedtime as needed for sleep.          [name] Kitchen tinidazole (TINDAMAX) 500 MG tablet                BP 118/67  Pulse 82  Temp(Src) 97.9 F (36.6 C) (Oral)  Resp 13  SpO2 96% Physical Exam  Nursing note and vitals reviewed. Constitutional: She is oriented to person, place, and time. She appears well-developed and  well-nourished. No distress.  HENT:  Head: Normocephalic and atraumatic.  Eyes: Conjunctivae and EOM are normal. Pupils are equal, round, and reactive to light.  Neck: Normal range of motion and phonation normal. Neck supple. No tracheal deviation present. No thyromegaly present.  Cardiovascular: Normal rate, regular rhythm and intact distal pulses.   Pulmonary/Chest: Effort normal and breath sounds normal. She exhibits no tenderness.  Abdominal: Soft. She exhibits no distension. There is no tenderness. There is no guarding.  Musculoskeletal: Normal range of motion.  Neurological: She is alert and oriented to person, place, and time. She exhibits normal muscle tone.  No dysarthria, aphasia or nystagmus. She is lucid.  Skin: Skin is warm and dry.  Psychiatric: She has a normal mood and affect. Her behavior is normal. Judgment and thought content normal.    ED Course  Procedures (including critical care time) Medications - No data to display  No data found.     Labs Review Labs Reviewed  COMPREHENSIVE METABOLIC PANEL - Abnormal; Notable for the following:    AST 52 (*)    ALT 80 (*)    All other components within normal limits  URINALYSIS, ROUTINE W REFLEX MICROSCOPIC - Abnormal; Notable for the following:    APPearance CLOUDY (*)    Ketones, ur 15 (*)    Leukocytes, UA SMALL (*)    All other components within normal limits  URINE MICROSCOPIC-ADD ON - Abnormal; Notable for the following:    Squamous Epithelial / LPF FEW (*)    Bacteria, UA MANY (*)    All other components within normal limits  URINE CULTURE  CBC   Imaging Review Ct Head Wo Contrast  09/13/2013   CLINICAL DATA:  Weakness, numbness  EXAM: CT HEAD WITHOUT CONTRAST  TECHNIQUE: Contiguous axial images were obtained from the base of the skull through the vertex without intravenous contrast.  COMPARISON:  11/13/2011  FINDINGS: There is no evidence of acute intracranial hemorrhage, brain edema, mass lesion, acute  infarction, mass effect, or midline shift. Acute infarct may be inapparent on noncontrast CT. No other intra-axial abnormalities are seen, and the ventricles and sulci are within normal limits in size and symmetry. No abnormal extra-axial fluid collections or masses are identified. No significant calvarial abnormality.  IMPRESSION: Negative for bleed or other acute  intracranial process.   Electronically Signed   By: Arne Cleveland M.D.   On: 09/13/2013 20:37    EKG Interpretation    Date/Time:  Tuesday September 13 2013 15:26:21 EST Ventricular Rate:  90 PR Interval:  132 QRS Duration: 80 QT Interval:  364 QTC Calculation: 445 R Axis:   70 Text Interpretation:  Normal sinus rhythm Normal ECG ED PHYSICIAN INTERPRETATION AVAILABLE IN [facility] Confirmed by TEST, RECORD ([id number]) on 09/15/2013 11:37:10 AM            MDM   1. Weakness      Nonspecific numbness and headache. Doubt meningitis, CVA, brain tumor, multiple sclerosis, metabolic instability, or serious bacterial infection.  Nursing Notes Reviewed/ Care Coordinated Applicable Imaging Reviewed Interpretation of Laboratory Data incorporated into ED treatment  The patient appears reasonably screened and/or stabilized for discharge and I doubt any other medical condition or other [facility] requiring further screening, evaluation, or treatment in the ED at this time prior to discharge.  Plan: Home Medications- usual; Home Treatments- rest; return here if the recommended treatment, does not improve the symptoms; Recommended follow up- PCP prn    Richarda Blade, MD 09/15/13 1537

## 2013-09-13 NOTE — ED Notes (Signed)
Pt reports hands are numb and pt reports that she feels so exhausted. RN retook vitals and ordered EKG.

## 2013-09-13 NOTE — Telephone Encounter (Signed)
Patient calling just to make Dr. Leonie Man aware that she is going to the ER since she is having a lot of neurological problems. Asked patient if she would like to schedule an appointment with [place] and she states "no, I think I have too much going on."

## 2013-09-13 NOTE — ED Notes (Signed)
Patient transported to CT 

## 2013-09-13 NOTE — Discharge Instructions (Signed)
Weakness  Weakness is a lack of strength. It may be felt all over the body (generalized) or in one specific part of the body (focal). Some causes of weakness can be serious. You may need further medical evaluation, especially if you are elderly or you have a history of immunosuppression (such as chemotherapy or HIV), kidney disease, heart disease, or diabetes.  CAUSES   Weakness can be caused by many different things, including:  · Infection.  · Physical exhaustion.  · Internal bleeding or other blood loss that results in a lack of red blood cells (anemia).  · Dehydration. This cause is more common in elderly people.  · Side effects or electrolyte abnormalities from medicines, such as pain medicines or sedatives.  · Emotional distress, anxiety, or depression.  · Circulation problems, especially severe peripheral arterial disease.  · Heart disease, such as rapid atrial fibrillation, bradycardia, or heart failure.  · Nervous system disorders, such as Guillain-Barré syndrome, multiple sclerosis, or stroke.  DIAGNOSIS   To find the cause of your weakness, your caregiver will take your history and perform a physical exam. Lab tests or X-rays may also be ordered, if needed.  TREATMENT   Treatment of weakness depends on the cause of your symptoms and can vary greatly.  HOME CARE INSTRUCTIONS   · Rest as needed.  · Eat a well-balanced diet.  · Try to get some exercise every day.  · Only take over-the-counter or prescription medicines as directed by your caregiver.  SEEK MEDICAL CARE IF:   · Your weakness seems to be getting worse or spreads to other parts of your body.  · You develop new aches or pains.  SEEK IMMEDIATE MEDICAL CARE IF:   · You cannot perform your normal daily activities, such as getting dressed and feeding yourself.  · You cannot walk up and down stairs, or you feel exhausted when you do so.  · You have shortness of breath or chest pain.  · You have difficulty moving parts of your body.  · You have weakness  in only one area of the body or on only one side of the body.  · You have a fever.  · You have trouble speaking or swallowing.  · You cannot control your bladder or bowel movements.  · You have black or bloody vomit or stools.  MAKE SURE YOU:  · Understand these instructions.  · Will watch your condition.  · Will get help right away if you are not doing well or get worse.  Document Released: 08/04/2005 Document Revised: 02/03/2012 Document Reviewed: 10/03/2011  ExitCare® Patient Information ©2014 [employer].

## 2013-09-13 NOTE — ED Notes (Signed)
Multiple complaints. Reports left side headache and ear pain. Reports having generalized weakness and numbness sensation to legs for extended amount of time, became more severe last night, reports having memory loss last night, example "forgot how to chew her food while eating." no acute distress noted at triage, grips equal, pt ambulatory. Airway intact.

## 2013-09-14 ENCOUNTER — Other Ambulatory Visit: Payer: Self-pay | Admitting: Family Medicine

## 2013-09-14 ENCOUNTER — Telehealth: Payer: Self-pay | Admitting: Internal Medicine

## 2013-09-14 DIAGNOSIS — R202 Paresthesia of skin: Principal | ICD-10-CM

## 2013-09-14 DIAGNOSIS — R200 Anesthesia of skin: Secondary | ICD-10-CM

## 2013-09-14 LAB — URINE CULTURE: Colony Count: >=100000

## 2013-09-14 NOTE — Telephone Encounter (Signed)
Patient Information:  Caller Name: Hanks  Phone: [phone]  Patient: [name]  Gender: Female  DOB: February 05, 1961  Age: 53 Years  PCP: Shanon Ace (Family Practice)  Pregnant: No  Office Follow Up:  Does the office need to follow up with this patient?: Yes  Instructions For The Office: Please ask Dr. Regis Bill to call patient re: tests done in ER and recent sx   Symptoms  Reason For Call & Symptoms: Calling to let Dr. Regis Bill know that she was seen in ER on 09/13/13 after having trouble chewing food and numbness and tingling in extremities.  CT scan done in ER was normal but labs abnormal- liver enzymes are elevated (she does not drink ETOH).  Seen in office on 09/02/13 for weakness. The past few days she has been falling asleep randomly and waking up gasping. Seen by OB GYN on 09/06/13 dx with vaginosis and put on antibiotic.She has been only taking 1/2 of normal dose Ambien at night. She is worried that she is not tolerating medications normally  Face and tongue have been numb for past year.   [name] KitchenShe would like referral to see Dr. Leonie Man with [facility].  Reviewed Health History In EMR: Yes  Reviewed Medications In EMR: Yes  Reviewed Allergies In EMR: Yes  Reviewed Surgeries / Procedures: Yes  Date of Onset of Symptoms: 09/12/2013 OB / GYN:  LMP: Unknown  Guideline(s) Used:  No Protocol Available - Sick Adult  Disposition Per Guideline:   Callback by PCP Today  Reason For Disposition Reached:   Nursing judgment  Advice Given:  Call Back If:  New symptoms develop  You become worse.  Patient Will Follow Care Advice:  YES

## 2013-09-14 NOTE — Telephone Encounter (Signed)
Pt called this morning stating she was in the ER last night.  While there they did a CT and labs.  Her labs showed elevated liver enzymes.  The reason for her visiting the ER is because of increased fatigue, she feels as if she is going to pass out, she has numbness and tingling in her arms, legs and face.  In the last 24 hours she has had a hard time chewing and swallowing. She also complained of a shooting/stabbing like pain in her head and left ear.  ER doctor stated that she may want to see her neurologist.  She would like to get in to see Dr. Leonie Man as she has been a patient of his in the past.   Please call

## 2013-09-14 NOTE — Telephone Encounter (Signed)
Referral placed in the system. 

## 2013-09-14 NOTE — Telephone Encounter (Signed)
Ok to schedule - next available.

## 2013-09-14 NOTE — Telephone Encounter (Signed)
Pt called stating she had not received a call back.  I let her know that Dr. Leonie Man is at the hospital until after lunch and will be seeing patients in the afternoon. She is worried about being able to eat and drink.  Please call

## 2013-09-15 ENCOUNTER — Ambulatory Visit (INDEPENDENT_AMBULATORY_CARE_PROVIDER_SITE_OTHER): Payer: BC Managed Care – PPO | Admitting: Neurology

## 2013-09-15 ENCOUNTER — Encounter: Payer: Self-pay | Admitting: Neurology

## 2013-09-15 VITALS — BP 105/72 | HR 102 | Ht 60.0 in | Wt 142.0 lb

## 2013-09-15 DIAGNOSIS — R5383 Other fatigue: Principal | ICD-10-CM

## 2013-09-15 DIAGNOSIS — R5381 Other malaise: Secondary | ICD-10-CM

## 2013-09-15 NOTE — Progress Notes (Signed)
[facility] [address] [place]. [place] [address] [phone]       OFFICE FOLLOW-UP NOTE  [name] Date of Birth:  1960-08-23 Medical Record Number:  [id number]   HPI: 9 year Caucasian lady with known multi focal complains since 2012 when she saw me for numbness, dizziness, head pressure. Class the brain at that time had revealed a few nonspecific white matter hyperintensities for which she underwent extensive workup for multiple sclerosis by Dr. Gorden [name] in [place] which showed negative MRI scan of the cervical, thoracic spine and spinal tap which was negative for oligoclonal bands. Lab work for lupus, sarcoidosis, lines disease were all negative. She has a history of bipolar disorder as well. She was even seen in the ophthalmologist Dr. Hassell [name] at [facility] who did not find any significant abnormalities. She was last seen many on 07/26/2012.  She returns for followup of her last visit with me more than a year ago. She reports worsening of her symptoms in the last 2-3 weeks. She reports significant fatigue and she gets tired very easily. She also has intermittent tingling and numbness in her arms and feet as well as legs feel weak. She has in fact even noticed difficulty chewing her food and she has to move the food around her mouth with her tongue. She complains of a pressure feeling on her eyelids and at times vision gets blurred. She denies any diplopia, extremity weakness, gait or balance problems. She was seen by [name] gastroenterologist and was advised to undergo gastric function study and practice but she lost her insurance and could not afford to do it. She has recently got insurance back and she plans to see [name] next week.  ROS:   14 system review of systems is positive for fatigue, weight change, lites and 70, height pain, frequent infection, numbness, headache, weakness, tremors and all other systems negative.  PMH:  Past Medical History    Diagnosis Date  . Headache(784.0)   . IBS (irritable bowel syndrome)   . Bipolar 1 disorder     HX PSYCHOSIS  . Personality disorder     W/ BORDERLINE FEATURES  . History of duodenal ulcer   . Pelvic pain   . Complication of anesthesia     POST AGGITATION  . Frequency of urination   . Urgency of urination   . Nocturia     Social History:  History   Social History  . Marital Status: Single    Spouse Name: N/A    Number of Children: 0  . Years of Education: college   Occupational History  . N/A    Social History Main Topics  . Smoking status: Former Research scientist (life sciences)  . Smokeless tobacco: Never Used     Comment: ONLY SMOKED FOR 6 MONTHS --  QUIT  YRS AGO  . Alcohol Use: 1.8 oz/week    3 Cans of beer per week     Comment: socially   . Drug Use: No  . Sexual Activity: Not on file   Other Topics Concern  . 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    no tobacco    Medications:   Current Outpatient Prescriptions on File Prior to Visit  Medication Sig Dispense Refill  . Cholecalciferol (VITAMIN D-3) 1000 UNITS CAPS Take  2,000 Units by mouth daily.      [name] Kitchen gabapentin (NEURONTIN) 300 MG capsule Take 300 mg by mouth 3 (three) times daily as needed (for pain).      . hydrOXYzine (VISTARIL) 25 MG capsule Take 25 mg by mouth 3 (three) times daily as needed (for bladder control).      [name] Kitchen lithium 300 MG tablet Take 300 mg by mouth at bedtime.       . Multiple Vitamin (MULTIVITAMIN WITH MINERALS) TABS tablet Take 1 tablet by mouth daily.      [name] Kitchen zolpidem (AMBIEN) 10 MG tablet Take 5 mg by mouth at bedtime as needed for sleep.        No current facility-administered medications on file prior to visit.    Allergies:   Allergies  Allergen Reactions  . Morphine And Related Nausea And Vomiting    Physical Exam General:  Young Caucasian lady seated, in no evident distress Head: head normocephalic and atraumatic. Orohparynx benign Neck: supple with no carotid or supraclavicular bruits Cardiovascular: regular rate and rhythm, no murmurs Musculoskeletal: no deformity Skin:  no rash/petichiae Vascular:  Normal pulses all extremities Filed Vitals:   09/15/13 1413  BP: 105/72  Pulse: 102   Neurologic Exam Mental Status: Awake and fully alert. Oriented to place and time. Recent and remote memory intact. Attention span, concentration and fund of knowledge appropriate. Mood and affect appropriate.  Cranial Nerves: Fundoscopic exam reveals sharp disc margins. Pupils equal, briskly reactive to light. No afferent pupillary defect Extraocular movements full without nystagmus. Visual fields full to confrontation. Hearing intact. Facial sensation intact. Face, tongue, palate moves normally and symmetrically.  Motor: Normal bulk and tone. Normal strength in all tested extremity muscles. Sensory.: intact to touch and pinprick and vibratory sensation.  Coordination: Rapid alternating movements normal in all extremities. Finger-to-nose and heel-to-shin performed accurately bilaterally. Gait and Station: Arises from chair without difficulty. Stance is normal. Gait demonstrates normal stride length and balance . Able to heel, toe and tandem walk without difficulty.  Reflexes: 2+ brisk and symmetric. Toes downgoi   ASSESSMENT: 76 year with multifocal symptoms since 2012 with previous negative evaluation for multiple sclerosis with MRI scan showing nonspecific white matter hyperintensities but negative spinal imaging and spinal tap studies. She has had worsening of her subjective complaints for the last few weeks with increased fatigue and difficulty with chewing food an eyelid pressure but nonfocal neurological exam except for mild hyperreflexia.     PLAN: I had a long discussion with the patient with reference to her symptoms,  discuss results of prior evaluation studies, my current exam, plan and answered questions. Recommend checking an MRI scan of the brain with and without contrast as well as nerve conduction study with rapid repetitive stimulation for myasthenia. Return for followup in 2 months with Charlott Holler, NP or call earlier if necessary.     Note: This document was prepared with digital dictation and possible smart phrase technology. Any transcriptional errors that result from this process are unintentional

## 2013-09-15 NOTE — Patient Instructions (Signed)
I had a long discussion with the patient with reference to her symptoms, discuss results of prior evaluation studies, my current exam, plan and answered questions. Recommend checking an MRI scan of the brain with and without contrast as well as nerve conduction study with rapid repetitive stimulation for myasthenia. Return for followup in 2 months with Charlott Holler, NP or call earlier if necessary.

## 2013-09-15 NOTE — Telephone Encounter (Signed)
Called patient to put her on Dr. Leonie Man schedule for this afternoon, left a message to call the office back to schedule.

## 2013-09-16 ENCOUNTER — Telehealth: Payer: Self-pay | Admitting: Internal Medicine

## 2013-09-16 NOTE — Telephone Encounter (Signed)
Sounds like anxiety. If no better, go to the ED.

## 2013-09-16 NOTE — Telephone Encounter (Signed)
Telephone encounter documentation for [facility] with 911 disposition on 09/16/13 not routed with [employer] to [place] [facility] CAN Pool. Saundrea in office notified.

## 2013-09-16 NOTE — Telephone Encounter (Signed)
Patient Information:  Caller Name: Kaley  Phone: [phone]  Patient: [name]  Gender: Female  DOB: May 24, 1961  Age: 53 Years  PCP: [facility]on [facility])  Pregnant: No  Office Follow Up:  Does the office need to follow up with this patient?: Yes  Instructions For The Office: Has 911 disposition- refuses to call 911- requesting callback   Symptoms  Reason For Call & Symptoms: Patryce states she has had extreme fatigue and breathing issues. States breathing is not regular onset since  1/16 when seen in office. 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 States " I would not have woken up the other day if a MD's office had not called me".  States she was "diagnosed with Bacterial Vaginosis and ordered medication on 09/06/13 and Dr. Regis Bill is not aware of this.". Very difficult to wake up at times.  States "I don't feel the way I should. c/o extreme fatigue and difficulty swallowing.  States she was seen in ED  on 09/13/13 due to numbness and tingling, difficulty swallowing food. States she had breathing issues in ED. Is talking in complete sentences without problems. Speech is not slurred. Per breathing difficulty protocol has 911 disposition due to difficult to awaken. Refuses to call 911. Please call Breia at [phone]  Reviewed Health History In EMR: Yes  Reviewed Medications In EMR: Yes  Reviewed Allergies In EMR: Yes  Reviewed Surgeries / Procedures: Yes  Date of Onset of Symptoms: 09/02/2013 OB / GYN:  LMP: Unknown  Guideline(s) Used:  Breathing Difficulty  Disposition Per Guideline:   Call EMS 911 Now  Reason For Disposition Reached:   Difficult to awaken or acting confused (e.g., disoriented, slurred speech)  Advice Given:  N/A  Patient Refused Recommendation:  Patient Refused Care Advice  States she does not need to call 911

## 2013-09-16 NOTE — Telephone Encounter (Signed)
Pt went to ED on Tuesday and was seen by neurology yesterday. Pt feels like her "breathing mechanism isn't working." Pt states that if she gets any worse she will go to the ED. States that she has things that she needs to take of but if it gets any worse she will go to the ED.

## 2013-09-16 NOTE — Telephone Encounter (Signed)
Patient has been scheduled and seen 01/29

## 2013-09-20 ENCOUNTER — Other Ambulatory Visit: Payer: Self-pay | Admitting: Gastroenterology

## 2013-09-20 ENCOUNTER — Telehealth: Payer: Self-pay | Admitting: Neurology

## 2013-09-20 DIAGNOSIS — R109 Unspecified abdominal pain: Secondary | ICD-10-CM

## 2013-09-20 NOTE — Telephone Encounter (Signed)
Dr. Amalia Greenhouse called and would like to speak with you regarding [name].  Please call his cell [phone].  Thank you

## 2013-09-21 ENCOUNTER — Ambulatory Visit (INDEPENDENT_AMBULATORY_CARE_PROVIDER_SITE_OTHER): Payer: BC Managed Care – PPO

## 2013-09-21 DIAGNOSIS — R5381 Other malaise: Secondary | ICD-10-CM

## 2013-09-21 DIAGNOSIS — R5383 Other fatigue: Principal | ICD-10-CM

## 2013-09-21 DIAGNOSIS — R51 Headache: Secondary | ICD-10-CM

## 2013-09-21 DIAGNOSIS — R209 Unspecified disturbances of skin sensation: Secondary | ICD-10-CM

## 2013-09-21 DIAGNOSIS — R269 Unspecified abnormalities of gait and mobility: Secondary | ICD-10-CM

## 2013-09-21 MED ORDER — GADOPENTETATE DIMEGLUMINE 469.01 MG/ML IV SOLN: 14.0000 mL | Freq: Once | INTRAVENOUS | Status: AC | PRN

## 2013-09-21 NOTE — Procedures (Signed)
     HISTORY:  [name] is a 53 year old patient with a history of problems with paresthesias all 4 extremities and generalized fatigue that has been present since 2012, gradually worsening over time. The patient is being evaluated for possible neuropathy or a neuromuscular transmission disorder.  NERVE CONDUCTION STUDIES:  Nerve conduction studies were performed on both upper extremities. The distal motor latencies and motor amplitudes for the median and ulnar nerves were within normal limits. The F wave latencies and nerve conduction velocities for these nerves were also normal. The sensory latencies for the median and ulnar nerves were normal.  Nerve conduction studies were performed on the right lower extremity. The distal motor latencies and motor amplitudes for the peroneal and posterior tibial nerves were within normal limits. The nerve conduction velocities for these nerves were also normal. The H reflex latency was normal. The sensory latency for the peroneal nerve was within normal limits.   A repetitive nerve stimulation study was performed to the left APB muscle with stimulation of the left median nerve at the wrist. Left APB muscle was fatigued for 1 minute, and then 10 stimulation trains at 3 Hz was administered at 15 seconds, 30 seconds, 1 minute, 2 minutes, 3 minutes, and 4 minutes following muscle fatigue. A good baseline was noted. No decremental response was seen.  A repetitive nerve stimulation study was performed to the left APB muscle with stimulation to the left median nerve at the wrist. One train of 10 stimulations given at 40 Hz. No incremental response was seen. A stable baseline was noted.   EMG STUDIES:  EMG evaluation was not performed.  IMPRESSION:  Nerve conduction studies done on both upper extremities and on the right lower extremity were normal. No evidence of a peripheral neuropathy is seen. EMG evaluation was not done. Repetitive nerve stimulation  on the right hand was done at 3 Hz stimulation, and again at 40 Hz stimulation without evidence of a neuromuscular transmission disorder. Repetitive nerve stimulation studies were completely normal.  Jill Alexanders MD 09/21/2013 4:20 PM  [facility] [address] [place], [place] [address]  Phone [phone] Fax [phone]

## 2013-09-30 ENCOUNTER — Ambulatory Visit (HOSPITAL_COMMUNITY)
Admission: RE | Admit: 2013-09-30 | Discharge: 2013-09-30 | Disposition: A | Discharge status: Home / Self Care | Payer: BC Managed Care – PPO | Source: Ambulatory Visit | Attending: Gastroenterology | Admitting: Gastroenterology

## 2013-09-30 DIAGNOSIS — R109 Unspecified abdominal pain: Secondary | ICD-10-CM | POA: Insufficient documentation

## 2013-09-30 DIAGNOSIS — R1013 Epigastric pain: Principal | ICD-10-CM

## 2013-09-30 DIAGNOSIS — R142 Eructation: Secondary | ICD-10-CM | POA: Insufficient documentation

## 2013-09-30 DIAGNOSIS — R141 Gas pain: Secondary | ICD-10-CM | POA: Insufficient documentation

## 2013-09-30 DIAGNOSIS — K3189 Other diseases of stomach and duodenum: Secondary | ICD-10-CM | POA: Insufficient documentation

## 2013-09-30 DIAGNOSIS — R143 Flatulence: Secondary | ICD-10-CM

## 2013-09-30 MED ORDER — TECHNETIUM TC 99M SULFUR COLLOID
2.0000 | Freq: Once | INTRAVENOUS | Status: AC | PRN
  Administered 2013-09-30: 2 via ORAL

## 2013-10-03 ENCOUNTER — Telehealth: Payer: Self-pay | Admitting: Neurology

## 2013-10-03 NOTE — Telephone Encounter (Signed)
Pt called stating that her symptoms are getting worse.  She is really having a hard time getting out of bed in the morning and finds that she has to lie down during the day and weakness.  She is also having issues with chewing her food, and blurred visions.  She stated that yesterday her vision was really blurry.  She also wonders if she is having issues breathing at night and if she may have apnea.  She would like to know what is going on with her and what the results of her testings show.  Please call.  Thank you

## 2013-10-04 NOTE — Telephone Encounter (Signed)
She has appt in 4/015 with Geraldo Pitter, NP

## 2013-10-04 NOTE — Telephone Encounter (Signed)
I called and left message on her answering machine stating I do not have any test results to share with her. She was perhaps referring to gastric emptying test ordered by Dr Collene Mares and was asked to contact her for results. We could see her sooner after her tests are completed as she is not doing well.

## 2013-10-06 ENCOUNTER — Ambulatory Visit (INDEPENDENT_AMBULATORY_CARE_PROVIDER_SITE_OTHER): Payer: BC Managed Care – PPO | Admitting: Neurology

## 2013-10-06 ENCOUNTER — Encounter: Payer: Self-pay | Admitting: Neurology

## 2013-10-06 VITALS — BP 121/80 | HR 80 | Ht 60.0 in | Wt 147.0 lb

## 2013-10-06 DIAGNOSIS — R5381 Other malaise: Secondary | ICD-10-CM

## 2013-10-06 DIAGNOSIS — R5383 Other fatigue: Principal | ICD-10-CM

## 2013-10-06 NOTE — Patient Instructions (Signed)
I had a long discussion with the patient's with her multifocal complaints discuss the results of MRI scan showing nonspecific white matter hyperintensities which are long-standing. Discussed results of nerve conduction study and answered questions. I do not have any further neurological insight into her symptoms and suggested a possible referral to a tertiary care center for a second opinion if so desired. No further followup appointment was made with me. She is advised followup with her gastroenterologist Dr. Collene Mares discussed results of gastric emptying time.

## 2013-10-06 NOTE — Progress Notes (Signed)
[facility] [address] [place]. [place] [address] [phone]       OFFICE FOLLOW-UP NOTE  [name] Date of Birth:  12-03-1960 Medical Record Number:  [id number]   HPI: Update 10/06/13 : Patient is seen urgently today as she called complaining of worsening of her subjective complaints. She reports her weakness getting worse having trouble chewing as well as blurred vision and breathing difficulties. She can hardly get out of bed and she wants to know why she recall over. She had an MRI scan of the brain which I had ordered on 09/21/13 which I personally reviewed shows tiny nonspecific bilateral subcortical and periventricular white matter hyperintensities with a wide differential. Head unfortunately do not have her prior films to compare this to. EMG nerve conduction study done on 09/21/13 was normal and rapid repetitive nerve stimulation did not show any evidence of neuromuscular transmission disorder. She had gastric emptying nuclear study done by Dr. Collene Mares which showed that the person reduction in gastric antrum time but I am unclear as to what this finding means. The patient has few years ago had extensive negative evaluation for multiple sclerosis including spinal tap to potentials had a second opinion from Dr. Gorden Harms at [facility] for similar complaints  Initial Consult  09/15/13 : 86 year Caucasian lady with known multi focal complains since 2012 when she saw me for numbness, dizziness, head pressure. Class the brain at that time had revealed a few nonspecific white matter hyperintensities for which she underwent extensive workup for multiple sclerosis by Dr. Gorden Harms in [place] which showed negative MRI scan of the cervical, thoracic spine and spinal tap which was negative for oligoclonal bands. Lab work for lupus, sarcoidosis, lines disease were all negative. She has a history of bipolar disorder as well. She was even seen in the ophthalmologist Dr.  Hassell Done at [facility] who did not find any significant abnormalities. She was last seen many on 07/26/2012.  She returns for followup of her last visit with me more than a year ago. She reports worsening of her symptoms in the last 2-3 weeks. She reports significant fatigue and she gets tired very easily. She also has intermittent tingling and numbness in her arms and feet as well as legs feel weak. She has in fact even noticed difficulty chewing her food and she has to move the food around her mouth with her tongue. She complains of a pressure feeling on her eyelids and at times vision gets blurred. She denies any diplopia, extremity weakness, gait or balance problems. She was seen by Dr. Collene Mares gastroenterologist and was advised to undergo gastric function study and practice but she lost her insurance and could not afford to do it. She has recently got insurance back and she plans to see Dr. Collene Mares next week.  ROS:   14 system review of systems is positive for fatigue, weight change, lites and 70, height pain, frequent infection, numbness, headache, weakness, tremors and all other systems negative.  PMH:  Past Medical History  Diagnosis Date  . Headache(784.0)   . IBS (irritable bowel syndrome)   . Bipolar 1 disorder     HX PSYCHOSIS  . Personality disorder     W/ BORDERLINE FEATURES  . History of duodenal ulcer   . Pelvic pain   . Complication of anesthesia     POST AGGITATION  . Frequency of urination   . Urgency of urination   . Nocturia     Social History:  History   Social History  . Marital Status: Single    Spouse Name: N/A    Number of Children: 0  . Years of Education: college   Occupational History  . N/A     Nurse   Social History Main Topics  . Smoking status: Former Research scientist (life sciences)  . Smokeless tobacco: Never Used     Comment: ONLY SMOKED FOR 6 MONTHS --  QUIT  YRS AGO  . Alcohol Use: 1.8 oz/week    3 Cans of beer per week     Comment: socially   . Drug Use: No  . Sexual  Activity: Not on file   Other Topics Concern  . 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    no tobacco   Caffeine Use: very little, three times a week    Medications:   Current Outpatient Prescriptions on File Prior to Visit  Medication Sig Dispense Refill  . Cholecalciferol (VITAMIN D-3) 1000 UNITS CAPS Take 2,000 Units by mouth daily.      [name] Kitchen lithium 300 MG tablet Take 300 mg by mouth at bedtime.       . Multiple Vitamin (MULTIVITAMIN WITH MINERALS) TABS tablet Take 1 tablet by mouth daily.      [name] Kitchen zolpidem (AMBIEN) 10 MG tablet Take 5 mg by mouth at bedtime as needed for sleep.        No current facility-administered medications on file prior to visit.    Allergies:   Allergies  Allergen Reactions  . Morphine And Related Nausea And Vomiting    Physical Exam General: Young Caucasian lady seated, in no evident distress Head: head normocephalic and atraumatic. Orohparynx benign Neck: supple with no carotid or supraclavicular bruits Cardiovascular: regular rate and rhythm, no murmurs Musculoskeletal: no deformity Skin:  no rash/petichiae Vascular:  Normal pulses all extremities Filed Vitals:   10/06/13 1419  BP: 121/80  Pulse: 80   Neurologic Exam Mental Status: Awake and fully alert. Oriented to place and time. Recent and remote memory intact. Attention span, concentration and fund of knowledge appropriate. Mood and affect appropriate.  Cranial Nerves: Fundoscopic exam reveals sharp disc margins. Pupils equal, briskly reactive to light. No afferent pupillary defect Extraocular movements full without nystagmus. Visual fields full to confrontation. Hearing intact. Facial sensation intact. Face, tongue, palate moves normally and symmetrically.  Motor: Normal bulk and tone. Normal strength in all tested extremity  muscles. Sensory.: intact to touch and pinprick and vibratory sensation.  Coordination: Rapid alternating movements normal in all extremities. Finger-to-nose and heel-to-shin performed accurately bilaterally. Gait and Station: Arises from chair without difficulty. Stance is normal. Gait demonstrates normal stride length and balance . Able to heel, toe and tandem walk without difficulty.  Reflexes: 2+ brisk and symmetric. Toes downgoi   ASSESSMENT: 31 year with multifocal symptoms since 2012 with previous negative evaluation for multiple sclerosis with MRI scan showing nonspecific white matter hyperintensities but negative spinal imaging and spinal tap studies. She has had worsening of her subjective complaints for the last few weeks with increased fatigue and difficulty with chewing food an eyelid pressure but nonfocal neurological exam except for mild hyperreflexia.     PLAN: I had a long discussion with the patient with reference to her symptoms, discuss results of prior evaluation studies, my current exam, plan and answered  questions. Recommend checking an MRI scan of the brain with and without contrast as well as nerve conduction study with rapid repetitive stimulation for myasthenia. Return for followup in 2 months with Charlott Holler, NP or call earlier if necessary.     Note: This document was prepared with digital dictation and possible smart phrase technology. Any transcriptional errors that result from this process are unintentional

## 2013-10-17 ENCOUNTER — Other Ambulatory Visit (HOSPITAL_COMMUNITY): Payer: Self-pay | Admitting: Urology

## 2013-10-17 DIAGNOSIS — N361 Urethral diverticulum: Secondary | ICD-10-CM

## 2013-10-20 ENCOUNTER — Telehealth: Payer: Self-pay | Admitting: Internal Medicine

## 2013-10-20 DIAGNOSIS — R0689 Other abnormalities of breathing: Secondary | ICD-10-CM

## 2013-10-20 DIAGNOSIS — R0683 Snoring: Secondary | ICD-10-CM

## 2013-10-20 NOTE — Telephone Encounter (Signed)
Does she want a sleep evaluation?  If so then do pulmonary referral

## 2013-10-20 NOTE — Telephone Encounter (Signed)
Pt decline to see another provider. Pt is having weakness,fatigue and ?sleep apnea. Please advise

## 2013-10-20 NOTE — Telephone Encounter (Signed)
Pt sees Dr. Leonie Man for malaise and fatigue. Would you like to do a referral to pulmonary for sleep study? Please advise.  Thanks!

## 2013-10-21 NOTE — Telephone Encounter (Signed)
Left message at the below listed number for the pt to return my call. 

## 2013-10-24 NOTE — Telephone Encounter (Signed)
Spoke to the pt.  Informed her to call Dr. Clydene Fake office for malaise and fatigue.  She would like a referral to pulmonary for a sleep study.  Says she is waking herself snoring and gasping for air in the middle of the night.  Also complains of jaw pain.  Was advised by Dr. Clydene Fake office to call her dentist.  Durward Fortes that she may be grinding her teeth in her sleep.  This could be monitored during her sleep study and that she should contact her dentist.  She wanted Dr. Regis Bill to be aware of her jaw pain.  Will place referral for pulmonary.

## 2013-10-24 NOTE — Telephone Encounter (Signed)
Left message at the below listed number for the pt to return my call. 

## 2013-10-25 ENCOUNTER — Ambulatory Visit (HOSPITAL_COMMUNITY)
Admission: RE | Admit: 2013-10-25 | Discharge: 2013-10-25 | Disposition: A | Discharge status: Home / Self Care | Payer: BC Managed Care – PPO | Source: Ambulatory Visit | Attending: Urology | Admitting: Urology

## 2013-10-25 DIAGNOSIS — N361 Urethral diverticulum: Secondary | ICD-10-CM

## 2013-10-25 DIAGNOSIS — R350 Frequency of micturition: Secondary | ICD-10-CM | POA: Insufficient documentation

## 2013-10-25 DIAGNOSIS — N3944 Nocturnal enuresis: Secondary | ICD-10-CM | POA: Insufficient documentation

## 2013-10-25 DIAGNOSIS — R209 Unspecified disturbances of skin sensation: Secondary | ICD-10-CM | POA: Insufficient documentation

## 2013-10-25 MED ORDER — GADOBENATE DIMEGLUMINE 529 MG/ML IV SOLN
12.0000 mL | Freq: Once | INTRAVENOUS | Status: AC | PRN
  Administered 2013-10-25: 12 mL via INTRAVENOUS

## 2013-11-07 ENCOUNTER — Ambulatory Visit (INDEPENDENT_AMBULATORY_CARE_PROVIDER_SITE_OTHER): Payer: BC Managed Care – PPO | Admitting: Internal Medicine

## 2013-11-07 ENCOUNTER — Encounter: Payer: Self-pay | Admitting: Internal Medicine

## 2013-11-07 ENCOUNTER — Telehealth: Payer: Self-pay | Admitting: Internal Medicine

## 2013-11-07 VITALS — BP 126/82 | Temp 98.4°F | Ht 60.0 in | Wt 146.0 lb

## 2013-11-07 DIAGNOSIS — R599 Enlarged lymph nodes, unspecified: Secondary | ICD-10-CM

## 2013-11-07 DIAGNOSIS — R5383 Other fatigue: Secondary | ICD-10-CM

## 2013-11-07 DIAGNOSIS — M545 Low back pain, unspecified: Secondary | ICD-10-CM

## 2013-11-07 DIAGNOSIS — R5381 Other malaise: Secondary | ICD-10-CM

## 2013-11-07 LAB — POCT URINALYSIS DIP (MANUAL ENTRY)
Bilirubin, UA: NEGATIVE
Blood, UA: NEGATIVE
Glucose, UA: NEGATIVE
Ketones, POC UA: NEGATIVE
NITRITE UA: NEGATIVE
Protein Ur, POC: NEGATIVE
Spec Grav, UA: 1.02
UROBILINOGEN UA: 0.2
pH, UA: 7.5

## 2013-11-07 MED ORDER — VALACYCLOVIR HCL 1 G PO TABS: 1000.0000 mg | ORAL_TABLET | Freq: Three times a day (TID) | ORAL | Status: DC

## 2013-11-07 NOTE — Patient Instructions (Addendum)
This acts like a viral syndrome on top of your other sx.  Will check labs .   Keep fu with dr Casimiro Needle  If you break out in a rash in the back area take  The valtrex as directed .  Will let  You lknow results . Track your temperature  And contact us if fever fore more than 3 days

## 2013-11-07 NOTE — Telephone Encounter (Signed)
Patient Information:  Caller Name: Ameilia  Phone: [phone]  Patient: [name]  Gender: Female  DOB: 09/07/1960  Age: 53 Years  PCP: Shanon Ace [facility])  Pregnant: No  Office Follow Up:  Does the office need to follow up with this patient?: No  Instructions For The Office: N/A  RN Note:  Patient calling c/o mild weakness x 2 months. Able to preform ADL's. Denies SOB, difficulty breathing, or any chest pain.  Declines  office visit stating "I don't want to wait for 6 hours in an office or ED, so I'll just call 911 if I feel worse."  Symptoms  Reason For Call & Symptoms: c/o weakness, neck swelling  Reviewed Health History In EMR: Yes  Reviewed Medications In EMR: Yes  Reviewed Allergies In EMR: Yes  Reviewed Surgeries / Procedures: Yes  Date of Onset of Symptoms: 09/12/2013  Treatments Tried: multipule antibiotics  Treatments Tried Worked: No OB / GYN:  LMP: Unknown  Guideline(s) Used:  Weakness (Generalized) and Fatigue  Disposition Per Guideline:   See Within 3 Days in Office  Reason For Disposition Reached:   Mild weakness (i.e., does not interfere with ability to work, go to school, normal activities) and persists > 1 week  Advice Given:  N/A  Patient Refused Recommendation:  Patient Refused Care Advice  Patient refuses care

## 2013-11-07 NOTE — Progress Notes (Signed)
Pre visit review using our clinic review tool, if applicable. No additional management support is needed unless otherwise documented below in the visit note.   Chief Complaint  Patient presents with  . Fatigue    States she has swollen lymph nodes around her body.  Has chills.  . Back Pain    HPI: Patient comes in today for SDA for  new problem evaluation. Yesterday  Glands were a problem and throat were swollen and into ears. I think there is an infection with chills  And .  Last thrutsya had back pain   Noted place of spine and was sore  she is very worried because she was very weak feeling over the last couple days and her oral temperature was on the low side she is worried about sepsis wonders if she should have a blood culture.  [name] is well known to our practice and has been evaluated for many concerns complaints without a specific diagnosis disease state except for bipolar disorder. Most recently she has seen a neurologist who thought she could have had myasthenia gravis but testing was negative  Has seen Dr Janice Norrie     "Done with him and come back to PCP"  Cause cant help any more.   Has 9 antibiotics in the last year  . [name] Kitchen   And hard to chew  For months   ROS: See pertinent positives and negatives per HPI. No current chest pain coughing new shortness of breath to see her psychiatrist tomorrow states that she can't work has lost her job and is living off money given to her by her mother.  Past Medical History  Diagnosis Date  . Headache(784.0)   . IBS (irritable bowel syndrome)   . Bipolar 1 disorder     HX PSYCHOSIS  . Personality disorder     W/ BORDERLINE FEATURES  . History of duodenal ulcer   . Pelvic pain   . Complication of anesthesia     POST AGGITATION  . Frequency of urination   . Urgency of urination   . Nocturia     Family History  Problem Relation Age of Onset  . Sleep apnea Father   . Migraines Sister     headaches  . Other Mother     MAC  infection    History   Social History  . Marital Status: Single    Spouse Name: N/A    Number of Children: 0  . Years of Education: college   Occupational History  . N/A     Nurse   Social History Main Topics  . Smoking status: Former Research scientist (life sciences)  . Smokeless tobacco: Never Used     Comment: ONLY SMOKED FOR 6 MONTHS --  QUIT  YRS AGO  . Alcohol Use: 1.8 oz/week    3 Cans of beer per week     Comment: socially   . Drug Use: No  . Sexual Activity: None   Other Topics Concern  . 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    no tobacco   Caffeine Use: very little, three times a week    Outpatient Encounter Prescriptions as of 11/07/2013  Medication Sig  . Cholecalciferol (VITAMIN D-3) 1000 UNITS CAPS Take 2,000 Units by mouth daily.  [name] Kitchen lithium 300 MG tablet Take 300  mg by mouth at bedtime.   . Multiple Vitamin (MULTIVITAMIN WITH MINERALS) TABS tablet Take 1 tablet by mouth daily.  [name] Kitchen zolpidem (AMBIEN) 10 MG tablet Take 5 mg by mouth at bedtime as needed for sleep.   . valACYclovir (VALTREX) 1000 MG tablet Take 1 tablet (1,000 mg total) by mouth 3 (three) times daily.    EXAM:  BP 126/82  Temp(Src) 98.4 F (36.9 C) (Oral)  Ht 5' (1.524 m)  Wt 146 lb (66.225 kg)  BMI 28.51 kg/m2  Body mass index is 28.51 kg/(m^2).  GENERAL: vitals reviewed and listed above, alert, oriented, appears well hydrated and in no acute distress looks a bit tired nontoxic oriented HEENT: atraumatic, conjunctiva  clear, no obvious abnormalities on inspection of external nose and ears TMs are intact OP : no lesion edema or exudate +1 redness no lesions good airway NECK: no obvious masses on inspection palpation tender a.c. no shoddy LUNGS: clear to auscultation bilaterally, no wheezes, rales or rhonchi, good air movement CV: HRRR, no clubbing  cyanosis or  peripheral edema nl cap refill  Abdomen well-healed scar from her laparoscopy no guarding rebound or masses are noted. MS: moves all extremities without noticeable focal  Abnormality neurologic is grossly intact. Skin no acute rashes although where she points in the right lower back there is some faint erythema uncertain if it is from manipulation versus an early rash. No vesicular lesions. PSYCH: Normal speech oriented cooperative concerned about her condition.  ASSESSMENT AND PLAN:  Discussed the following assessment and plan:  Glands swollen - Plan: CBC with Differential, Hepatic function panel, C-reactive protein, CMV IgM, Epstein-Barr virus VCA antibody panel, HIV antibody, Basic metabolic panel, POCT urinalysis dipstick, Culture, Group A Strep  Low back ache - Plan: CBC with Differential, Hepatic function panel, C-reactive protein, CMV IgM, Epstein-Barr virus VCA antibody panel, HIV antibody, Basic metabolic panel, POCT urinalysis dipstick, Culture, Group A Strep  Other malaise and fatigue - Plan: CBC with Differential, Hepatic function panel, C-reactive protein, CMV IgM, Epstein-Barr virus VCA antibody panel, HIV antibody, Basic metabolic panel, POCT urinalysis dipstick, Culture, Group A Strep Her symptoms have been consistent with somatizations disorder however today she may well have a viral infection that is not being harder than usual because of her underlying conditions concerns. Uncertain if she is developing a rash in the right lower back which could be shingles prescription given to get filled if she develops a rash near that area of pain.  Review; she has been evaluated by neurology urology gastroenterology gynecology for a number of her symptoms.been to the emergency one time in the last 6 months January 27 for "weakness" CT MRI of the head showed tiny bilateral subcortical periventricular nonspecific white hyperintensities. From then she saw neurology. She states she  feels very tired and it feels worse will go to the emergency room I don't think she has sepsis discussed medications was on Cipro 3 weeks ago. Would've thought that medication will cause symptoms earlier for was related. -Patient advised to return or notify health care team  if symptoms worsen ,persist or new concerns arise.  Patient Instructions  This acts like a viral syndrome on top of your other sx.  Will check labs .   Keep fu with dr Casimiro Needle  If you break out in a rash in the back area take  The valtrex as directed .  Will let  You lknow results . Track your temperature  And contact us if fever fore more than  3 days    Standley Brooking. Panosh M.D.

## 2013-11-07 NOTE — Telephone Encounter (Signed)
Spoke to the pt who has an appt at 3:30.  She complains of chills, swelling lymph nodes throughout her body that are painful.

## 2013-11-07 NOTE — Telephone Encounter (Signed)
Pt was just seen and states she forgot to inform dr. Regis Bill that she is does have an increase in urinating, states she is going every 3 hrs.

## 2013-11-08 LAB — CBC WITH DIFFERENTIAL/PLATELET
BASOS ABS: 0 10*3/uL (ref 0.0–0.1)
Basophils Relative: 0.4 % (ref 0.0–3.0)
Eosinophils Absolute: 0.3 10*3/uL (ref 0.0–0.7)
Eosinophils Relative: 4.4 % (ref 0.0–5.0)
HEMATOCRIT: 42.7 % (ref 36.0–46.0)
HEMOGLOBIN: 14.2 g/dL (ref 12.0–15.0)
LYMPHS PCT: 21.1 % (ref 12.0–46.0)
Lymphs Abs: 1.2 10*3/uL (ref 0.7–4.0)
MCHC: 33.3 g/dL (ref 30.0–36.0)
MCV: 90.9 fl (ref 78.0–100.0)
Monocytes Absolute: 0.6 10*3/uL (ref 0.1–1.0)
Monocytes Relative: 9.9 % (ref 3.0–12.0)
NEUTROS ABS: 3.8 10*3/uL (ref 1.4–7.7)
Neutrophils Relative %: 64.2 % (ref 43.0–77.0)
PLATELETS: 303 10*3/uL (ref 150.0–400.0)
RBC: 4.69 Mil/uL (ref 3.87–5.11)
RDW: 11.9 % (ref 11.5–14.6)
WBC: 5.9 10*3/uL (ref 4.5–10.5)

## 2013-11-08 LAB — BASIC METABOLIC PANEL
BUN: 10 mg/dL (ref 6–23)
CALCIUM: 10.6 mg/dL — AB (ref 8.4–10.5)
CO2: 25 mEq/L (ref 19–32)
Chloride: 106 mEq/L (ref 96–112)
Creatinine, Ser: 0.8 mg/dL (ref 0.4–1.2)
GFR: 81.27 mL/min (ref >60.00)
Glucose, Bld: 99 mg/dL (ref 70–99)
POTASSIUM: 4.4 meq/L (ref 3.5–5.1)
SODIUM: 138 meq/L (ref 135–145)

## 2013-11-08 LAB — HEPATIC FUNCTION PANEL
ALK PHOS: 85 U/L (ref 39–117)
ALT: 67 U/L — AB (ref 0–35)
AST: 39 U/L — ABNORMAL HIGH (ref 0–37)
Albumin: 4.5 g/dL (ref 3.5–5.2)
BILIRUBIN TOTAL: 1.2 mg/dL (ref 0.3–1.2)
Bilirubin, Direct: 0.1 mg/dL (ref 0.0–0.3)
Total Protein: 7.5 g/dL (ref 6.0–8.3)

## 2013-11-08 LAB — CMV IGM: CMV IgM: <8 AU/mL (ref <30.00)

## 2013-11-08 LAB — EPSTEIN-BARR VIRUS VCA ANTIBODY PANEL
EBV EA IgG: 16 U/mL — ABNORMAL HIGH (ref <9.0)
EBV NA IgG: 591 U/mL — ABNORMAL HIGH (ref <18.0)
EBV VCA IgG: >750 U/mL — ABNORMAL HIGH (ref <18.0)

## 2013-11-08 LAB — HIV ANTIBODY (ROUTINE TESTING W REFLEX): HIV: NONREACTIVE

## 2013-11-08 LAB — C-REACTIVE PROTEIN: CRP: <0.5 mg/dL (ref 0.5–20.0)

## 2013-11-09 LAB — CULTURE, GROUP A STREP: Organism ID, Bacteria: NORMAL

## 2013-11-11 ENCOUNTER — Encounter: Payer: Self-pay | Admitting: Pulmonary Disease

## 2013-11-11 ENCOUNTER — Ambulatory Visit (INDEPENDENT_AMBULATORY_CARE_PROVIDER_SITE_OTHER): Payer: BC Managed Care – PPO | Admitting: Pulmonary Disease

## 2013-11-11 VITALS — BP 110/72 | HR 79 | Temp 98.2°F | Ht 65.0 in | Wt 146.0 lb

## 2013-11-11 DIAGNOSIS — G4733 Obstructive sleep apnea (adult) (pediatric): Secondary | ICD-10-CM

## 2013-11-11 NOTE — Patient Instructions (Signed)
Schedule sleep study for obstructive sleep apnea

## 2013-11-11 NOTE — Assessment & Plan Note (Signed)
Given excessive daytime somnolence, narrow pharyngeal exam,  & loud snoring, obstructive sleep apnea is possible & an overnight polysomnogram will be scheduled as a split study. The pathophysiology of obstructive sleep apnea , it's cardiovascular consequences & modes of treatment including CPAP were discused with the patient in detail & they evidenced understanding.

## 2013-11-11 NOTE — Progress Notes (Signed)
Subjective:    Patient ID: [name], female    DOB: [birth year], 53 y.o.   MRN: [id number]  HPI  PCP- Panosh  53 year old nonsmoker presents for evaluation of excessive daytime fatigue. 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 She reports bipolar disorder since the age of 67 and is on lithium for many years. Engineer, petroleum) She has many constitutional symptoms, she underwent exploratory laparotomy in 10/2012 and was told that she had functional abdominal pain. She has undergone extensive evaluation and imaging of her brain , seen a neurologist who was evaluated her for multiple sclerosis. I note negative CT of the neck in 10/2011. She reports multiple UTIs requiring 9 antibiotics were spent 9 months and underwent urologic examination that was negative (nesi). She now reports swelling in her neck and axilla which she thinks that lymph nodes, and right lower and paraspinal back pain.  She lives alone and no bed partner history is available Epworth sleepiness score is 10, and she reports fatigue all day. Bedtime is around 1 AM, sleep latency is about 45 minutes, she is 4-5 nocturnal awakenings there any post void sleep latency, she sleeps on her side with 2 pillows, she is out of bed by noon with dryness of mouth and denies headaches. Her weight has fluctuated within 20 pounds. There is no history suggestive of cataplexy, sleep paralysis or parasomnias   Past Medical History  Diagnosis Date  . Headache(784.0)   . IBS (irritable bowel syndrome)   . Bipolar 1 disorder     HX PSYCHOSIS  . Personality disorder     W/ BORDERLINE FEATURES  . History of duodenal ulcer   . Pelvic pain   . Complication of anesthesia     POST AGGITATION  . Frequency of urination   . Urgency of urination   . Nocturia     Past Surgical History  Procedure Laterality Date  . Laparoscopy N/A 12/14/2012    Procedure: LAPAROSCOPY DIAGNOSTIC;   Surgeon: Stark Klein, MD;  Location: [facility];  Service: General;  Laterality: N/A;  . Laparoscopic left salpingoophorectomy and lysys adhesions  03-10-2002  . Cardiovascular stress test  01-23-2011    NORMAL NUCLEAR STUDY/ NO ISCHEMIA/ EF 81%  . Transthoracic echocardiogram  10-13-2010    NORMAL LVF/  EF 55-60%  . Laparoscopic cholecystectomy  11-08-2001  . Cardiac catheterization  10-09-1999  DR KATZ    NORMAL LVF/  NORMAL RCA/  NO CRITICAL DISEASE LEFT CORONARY SYSTEM  . Laparoscopic removal ovary remnant  2003   [place]  . Total abdominal hysterectomy  2000    W/ RIGHT SALPINGOOPHORECTOMY  . Cystoscopy with biopsy N/A 06/03/2013    Procedure: CYSTOSCOPY WITH BIOPSY  INSTILLATION OF MARCAINE AND PYRIDIUM;  Surgeon: Hanley Ben, MD;  Location: [facility];  Service: Urology;  Laterality: N/A;    Allergies  Allergen Reactions  . Morphine And Related Nausea And Vomiting    History   Social History  . Marital Status: Single    Spouse Name: N/A    Number of Children: 0  . Years of Education: college   Occupational History  . N/A     Nurse   Social History Main Topics  . Smoking status: Former Smoker -- 0.01 packs/day for 1 years    Types: Cigarettes    Quit date: 08/18/2009  . Smokeless tobacco: Never Used     Comment:  ONLY SMOKED FOR 6 MONTHS --  QUIT  YRS AGO  . Alcohol Use: 1.8 oz/week    3 Cans of beer per week     Comment: socially   . Drug Use: No  . Sexual Activity: Not on file   Other Topics Concern  . 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    no tobacco   Caffeine Use: very little, three times a week    Family History  Problem Relation Age of Onset  . Sleep apnea Father   . Migraines Sister     headaches  . Other Mother     MAC infection      History    Social History  . Marital Status: Single    Spouse Name: N/A    Number of Children: 0  . Years of Education: college   Occupational History  . N/A     Nurse   Social History Main Topics  . Smoking status: Former Smoker -- 0.01 packs/day for 1 years    Types: Cigarettes    Quit date: 08/18/2009  . Smokeless tobacco: Never Used     Comment: ONLY SMOKED FOR 6 MONTHS --  QUIT  YRS AGO  . Alcohol Use: 1.8 oz/week    3 Cans of beer per week     Comment: socially   . Drug Use: No  . Sexual Activity: Not on file   Other Topics Concern  . 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    no tobacco   Caffeine Use: very little, three times a week      Review of Systems  Constitutional: Positive for appetite change. Negative for fever and unexpected weight change.  HENT: Negative for congestion, dental problem, ear pain, nosebleeds, postnasal drip, rhinorrhea, sinus pressure, sneezing, sore throat and trouble swallowing.   Eyes: Negative for redness and itching.  Respiratory: Positive for cough. Negative for chest tightness, shortness of breath and wheezing.   Cardiovascular: Positive for chest pain. Negative for palpitations and leg swelling.  Gastrointestinal: Positive for abdominal pain. Negative for nausea and vomiting.  Genitourinary: Negative for dysuria.  Musculoskeletal: Negative for joint swelling.  Skin: Negative for rash.  Neurological: Negative for headaches.  Hematological: Does not bruise/bleed easily.  Psychiatric/Behavioral: Negative for dysphoric mood. The patient is not nervous/anxious.        Objective:   Physical Exam  Gen. Pleasant, well-nourished, in no distress, normal affect ENT - no lesions, no post nasal drip Neck: No JVD, no thyromegaly, no carotid bruits, No lymphadenopathy Lungs: no  use of accessory muscles, no dullness to percussion, clear without rales or rhonchi  Cardiovascular: Rhythm regular, heart sounds  normal, no murmurs or gallops, no peripheral edema Abdomen: soft and non-tender, no hepatosplenomegaly, BS normal. Musculoskeletal: No deformities, no cyanosis or clubbing Neuro:  alert, non focal No axillary lymphadenopathy       Assessment & Plan:

## 2013-11-14 ENCOUNTER — Telehealth: Payer: Self-pay | Admitting: Internal Medicine

## 2013-11-14 NOTE — Telephone Encounter (Signed)
Error/gd °

## 2013-11-14 NOTE — Telephone Encounter (Signed)
Pt is call to let md know she know longer has back pain

## 2013-11-14 NOTE — Telephone Encounter (Signed)
Called patient per request from [place] call in regards to hives.  No answer.  Voicemail left to return callback if further assistance is needed.

## 2013-11-17 ENCOUNTER — Ambulatory Visit: Payer: BC Managed Care – PPO | Admitting: Nurse Practitioner

## 2013-11-22 ENCOUNTER — Other Ambulatory Visit: Payer: Self-pay | Admitting: Family Medicine

## 2013-11-22 ENCOUNTER — Telehealth: Payer: Self-pay | Admitting: Family Medicine

## 2013-11-22 ENCOUNTER — Other Ambulatory Visit (INDEPENDENT_AMBULATORY_CARE_PROVIDER_SITE_OTHER): Payer: BC Managed Care – PPO

## 2013-11-22 DIAGNOSIS — R7989 Other specified abnormal findings of blood chemistry: Secondary | ICD-10-CM

## 2013-11-22 DIAGNOSIS — D649 Anemia, unspecified: Secondary | ICD-10-CM

## 2013-11-22 DIAGNOSIS — E559 Vitamin D deficiency, unspecified: Secondary | ICD-10-CM

## 2013-11-22 DIAGNOSIS — R109 Unspecified abdominal pain: Secondary | ICD-10-CM

## 2013-11-22 DIAGNOSIS — R945 Abnormal results of liver function studies: Secondary | ICD-10-CM

## 2013-11-22 DIAGNOSIS — R350 Frequency of micturition: Secondary | ICD-10-CM

## 2013-11-22 DIAGNOSIS — R300 Dysuria: Secondary | ICD-10-CM

## 2013-11-22 LAB — HEPATIC FUNCTION PANEL
ALBUMIN: 4 g/dL (ref 3.5–5.2)
ALK PHOS: 90 U/L (ref 39–117)
ALT: 52 U/L — ABNORMAL HIGH (ref 0–35)
AST: 33 U/L (ref 0–37)
BILIRUBIN DIRECT: 0 mg/dL (ref 0.0–0.3)
BILIRUBIN TOTAL: 0.7 mg/dL (ref 0.3–1.2)
Total Protein: 7.1 g/dL (ref 6.0–8.3)

## 2013-11-22 LAB — POCT URINALYSIS DIPSTICK
BILIRUBIN UA: NEGATIVE
GLUCOSE UA: NEGATIVE
Ketones, UA: NEGATIVE
Nitrite, UA: NEGATIVE
RBC UA: NEGATIVE
Spec Grav, UA: 1.02
UROBILINOGEN UA: 0.2
pH, UA: 6

## 2013-11-22 LAB — IBC PANEL
IRON: 41 ug/dL — AB (ref 42–145)
Saturation Ratios: 11.2 % — ABNORMAL LOW (ref 20.0–50.0)
TRANSFERRIN: 262.3 mg/dL (ref 212.0–360.0)

## 2013-11-22 LAB — FERRITIN: Ferritin: 98.9 ng/mL (ref 10.0–291.0)

## 2013-11-22 NOTE — Telephone Encounter (Signed)
Pt called and she is still having frequent urination with odor & cloudiness. Also she has sinus symptoms including sinus & ear pressure, body aches, weakness, no appetite, a little diarrhea. These symptoms started on 11/18/13 and had a fever of 101.0 on Sunday, temperature this morning was 99.0.

## 2013-11-22 NOTE — Telephone Encounter (Signed)
Per Dr. Regis Bill order a urine & a culture. I put future order in computer and spoke with pt.

## 2013-11-23 LAB — ANA: Anti Nuclear Antibody(ANA): NEGATIVE

## 2013-11-23 LAB — URINE CULTURE
COLONY COUNT: NO GROWTH
Organism ID, Bacteria: NO GROWTH

## 2013-11-24 ENCOUNTER — Telehealth: Payer: Self-pay

## 2013-11-24 LAB — PTH, INTACT AND CALCIUM
Calcium: 9.8 mg/dL (ref 8.4–10.5)
PTH: 60.6 pg/mL (ref 14.0–72.0)

## 2013-11-24 LAB — CERULOPLASMIN: CERULOPLASMIN: 27 mg/dL (ref 18–53)

## 2013-11-24 NOTE — Telephone Encounter (Signed)
All labs are normal  dont know why they called for critical lab

## 2013-11-24 NOTE — Telephone Encounter (Signed)
Cerulo plasmin is 27; ANA negative, paraythroid hormone 60.6 Calcium 9.8

## 2013-11-27 ENCOUNTER — Encounter: Payer: Self-pay | Admitting: Internal Medicine

## 2013-12-16 ENCOUNTER — Other Ambulatory Visit: Payer: Self-pay | Admitting: Internal Medicine

## 2013-12-16 NOTE — Telephone Encounter (Signed)
Last given a rew years ago  [place] to refill disp 10 total

## 2013-12-16 NOTE — Telephone Encounter (Signed)
Do not see this on her active medication list.   Please advise.  Thanks!

## 2013-12-19 ENCOUNTER — Ambulatory Visit (HOSPITAL_BASED_OUTPATIENT_CLINIC_OR_DEPARTMENT_OTHER): Payer: BC Managed Care – PPO | Attending: Pulmonary Disease

## 2013-12-19 VITALS — Ht 60.0 in | Wt 145.0 lb

## 2013-12-19 DIAGNOSIS — R0989 Other specified symptoms and signs involving the circulatory and respiratory systems: Secondary | ICD-10-CM | POA: Insufficient documentation

## 2013-12-19 DIAGNOSIS — R5383 Other fatigue: Principal | ICD-10-CM

## 2013-12-19 DIAGNOSIS — R5381 Other malaise: Secondary | ICD-10-CM | POA: Insufficient documentation

## 2013-12-19 DIAGNOSIS — R0609 Other forms of dyspnea: Secondary | ICD-10-CM | POA: Insufficient documentation

## 2013-12-19 DIAGNOSIS — G4733 Obstructive sleep apnea (adult) (pediatric): Secondary | ICD-10-CM

## 2014-01-04 ENCOUNTER — Telehealth: Payer: Self-pay | Admitting: Pulmonary Disease

## 2014-01-04 DIAGNOSIS — G4733 Obstructive sleep apnea (adult) (pediatric): Secondary | ICD-10-CM

## 2014-01-04 NOTE — Telephone Encounter (Signed)
lmomtcb x1 

## 2014-01-04 NOTE — Telephone Encounter (Signed)
Sleep study showed a few events, but not enough to require CPAP or other intervention. Can schedule office visit if she wants to discuss in detail

## 2014-01-04 NOTE — Sleep Study (Signed)
[place]                                                                             NAME: [name]  DATE OF BIRTH: June 04, 1961  MEDICAL RECORD [id number]  LOCATION: [facility]   PHYSICIAN: Maelin Kurkowski V.   DATE OF STUDY: 12/19/2013   SLEEP STUDY TYPE: Nocturnal Polysomnogram   REFERRING PHYSICIAN: Rigoberto Noel, MD   INDICATION FOR STUDY:  53 year old with bipolar disorder, excessive daytime fatigue and snoring. At the time of this study ,they weighed 145 pounds with a height of 5 ft 0 inches and the BMI of 28, neck size of 13 inches. Epworth sleepiness score was 9   This nocturnal polysomnogram was performed with a sleep technologist in attendance. EEG, EOG,EMG and respiratory parameters recorded. Sleep stages, arousals, limb movements and respiratory data was scored according to criteria laid out by the American Academy of sleep medicine.   SLEEP ARCHITECTURE: Lights out was at 2254 PM and lights on was at 511 AM. Total sleep time was 236 minutes with a sleep period time of 313 minutes and a sleep efficiency of 63 %. Sleep latency was 26 minutes with latency to REM sleep of 142 minutes and wake after sleep onset of 115 minutes. . Sleep stages as a percentage of total sleep time was N1 -9.5 %,N2- 55 % and REM sleep 21 % ( 50 minutes) . The longest period of REM sleep was around 2 AM. There were long periods of awakening after 3 AM.  AROUSAL DATA : There were 80  arousals with an arousal index of 20 events per hour. Most of these were spontaneous & 7 were associated with respiratory events  RESPIRATORY DATA: There were 1 obstructive apneas, 12 central apneas, 1 mixed apneas and 6 hypopneas with apnea -hypopnea index of 5 events per hour. There were 19 RERAs with an RDI of 10 events per hour. There was no relation to sleep stage or body position. Supine sleep was noted  MOVEMENT/PARASOMNIA: There were 0 PLMS with a PLM index of  0 events per hour. The PLM arousal index was 0 per hour.  OXYGEN DATA: The lowest desaturation was 88 % during REM sleep and the desaturation index was 10 per hour.   CARDIAC DATA: The low heart rate was 51 beats per minute. The high heart rate recorded was an artifact. No arrhythmias were noted   DISCUSSION -Loud snoring was noted . She did not meet criteria for CPAP intervention. She was desensitized with a medium fullface mask  IMPRESSION :  1. No evidence of significant obstructive sleep apnea .There were a few RERAs causing sleep fragmentation and mild oxygen desaturation.  2. No evidence of cardiac arrhythmias,periodic limb movements or behavioral disturbance during sleep.  3. Sleep efficiency was decreased  RECOMMENDATION:  1. Treatment options for this degree of sleep disordered breathing include weight loss and sleep hygiene.  CPAP therapy and/ or oral appliance are not indicated at this time.  2. Patient should be cautioned against driving when sleepy  3. They should be asked to avoid medications with sedative side effects    Rigoberto Noel MD Diplomate, American  Board of Sleep Medicine    ELECTRONICALLY SIGNED ON: 01/04/2014  [facility]  PH: [phone] FX: [phone]  [place]

## 2014-01-05 NOTE — Telephone Encounter (Signed)
I spoke with patient about results and she verbalized understanding and had no questions 

## 2014-01-05 NOTE — Telephone Encounter (Signed)
Pt returned [name]'s call & can be reached at [phone].  [name]

## 2014-01-05 NOTE — Telephone Encounter (Signed)
lmomtcb x 2  

## 2014-01-16 ENCOUNTER — Telehealth: Payer: Self-pay | Admitting: Neurology

## 2014-01-16 DIAGNOSIS — R5381 Other malaise: Secondary | ICD-10-CM

## 2014-01-16 DIAGNOSIS — R5383 Other fatigue: Principal | ICD-10-CM

## 2014-01-16 NOTE — Telephone Encounter (Signed)
Patient calling with complaints of acute neurological issues.  She stated she experiencing weakness, not able to stay awake, and chewing food has gotten progressively worse.  Requesting Dr. Leonie Man to refer her to Neurological Clinic at [facility].  Please call and advise.

## 2014-01-17 NOTE — Telephone Encounter (Signed)
Patient returning Richeda's call.

## 2014-01-17 NOTE — Telephone Encounter (Signed)
Per Dr. Clydene Fake note.  Referral to tertiary center as Dr. Leonie Man did not have anything more to offer this pt.

## 2014-01-17 NOTE — Telephone Encounter (Signed)
Returned call lvm asking the patient to call back.

## 2014-01-18 ENCOUNTER — Telehealth: Payer: Self-pay | Admitting: Neurology

## 2014-01-18 NOTE — Telephone Encounter (Signed)
Thanks for information

## 2014-01-18 NOTE — Telephone Encounter (Signed)
Spoke with patient and informed her that referral had been faxed to [facility] Neurology, she verbalized understanding. She wanted to inform Dr Leonie Man that a family member had recently mentioned to her that grandfather's brother had died from [place].

## 2014-01-18 NOTE — Telephone Encounter (Signed)
Patient calling to get a referral to [facility] @ [facility] call patient and advise-thank you.

## 2014-01-19 ENCOUNTER — Emergency Department (HOSPITAL_COMMUNITY): Payer: BC Managed Care – PPO

## 2014-01-19 ENCOUNTER — Encounter (HOSPITAL_COMMUNITY): Payer: Self-pay | Admitting: Emergency Medicine

## 2014-01-19 ENCOUNTER — Telehealth: Payer: Self-pay | Admitting: Internal Medicine

## 2014-01-19 ENCOUNTER — Emergency Department (HOSPITAL_COMMUNITY)
Admission: EM | Admit: 2014-01-19 | Discharge: 2014-01-19 | Disposition: A | Discharge status: Home / Self Care | Payer: BC Managed Care – PPO | Attending: Emergency Medicine | Admitting: Emergency Medicine

## 2014-01-19 DIAGNOSIS — Z8719 Personal history of other diseases of the digestive system: Secondary | ICD-10-CM | POA: Insufficient documentation

## 2014-01-19 DIAGNOSIS — F609 Personality disorder, unspecified: Secondary | ICD-10-CM | POA: Insufficient documentation

## 2014-01-19 DIAGNOSIS — Z87891 Personal history of nicotine dependence: Secondary | ICD-10-CM | POA: Insufficient documentation

## 2014-01-19 DIAGNOSIS — R5381 Other malaise: Secondary | ICD-10-CM | POA: Insufficient documentation

## 2014-01-19 DIAGNOSIS — R079 Chest pain, unspecified: Secondary | ICD-10-CM

## 2014-01-19 DIAGNOSIS — R5383 Other fatigue: Secondary | ICD-10-CM

## 2014-01-19 DIAGNOSIS — Z9889 Other specified postprocedural states: Secondary | ICD-10-CM | POA: Insufficient documentation

## 2014-01-19 DIAGNOSIS — F319 Bipolar disorder, unspecified: Secondary | ICD-10-CM | POA: Insufficient documentation

## 2014-01-19 LAB — CBC WITH DIFFERENTIAL/PLATELET
BASOS PCT: 0 % (ref 0–1)
Basophils Absolute: 0 10*3/uL (ref 0.0–0.1)
EOS ABS: 0.4 10*3/uL (ref 0.0–0.7)
Eosinophils Relative: 6 % — ABNORMAL HIGH (ref 0–5)
HCT: 38.4 % (ref 36.0–46.0)
Hemoglobin: 12.9 g/dL (ref 12.0–15.0)
Lymphocytes Relative: 27 % (ref 12–46)
Lymphs Abs: 1.7 10*3/uL (ref 0.7–4.0)
MCH: 30.5 pg (ref 26.0–34.0)
MCHC: 33.6 g/dL (ref 30.0–36.0)
MCV: 90.8 fL (ref 78.0–100.0)
Monocytes Absolute: 0.8 10*3/uL (ref 0.1–1.0)
Monocytes Relative: 12 % (ref 3–12)
NEUTROS PCT: 55 % (ref 43–77)
Neutro Abs: 3.6 10*3/uL (ref 1.7–7.7)
PLATELETS: 264 10*3/uL (ref 150–400)
RBC: 4.23 MIL/uL (ref 3.87–5.11)
RDW: 12.9 % (ref 11.5–15.5)
WBC: 6.5 10*3/uL (ref 4.0–10.5)

## 2014-01-19 LAB — BASIC METABOLIC PANEL
BUN: 11 mg/dL (ref 6–23)
CO2: 22 mEq/L (ref 19–32)
Calcium: 9.9 mg/dL (ref 8.4–10.5)
Chloride: 105 mEq/L (ref 96–112)
Creatinine, Ser: 0.59 mg/dL (ref 0.50–1.10)
GLUCOSE: 104 mg/dL — AB (ref 70–99)
POTASSIUM: 4.3 meq/L (ref 3.7–5.3)
SODIUM: 142 meq/L (ref 137–147)

## 2014-01-19 LAB — I-STAT TROPONIN, ED: TROPONIN I, POC: 0 ng/mL (ref 0.00–0.08)

## 2014-01-19 LAB — TROPONIN I: Troponin I: <0.3 ng/mL (ref <0.30)

## 2014-01-19 MED ORDER — RANITIDINE HCL 150 MG PO TABS: 150.0000 mg | ORAL_TABLET | Freq: Two times a day (BID) | ORAL | Status: DC

## 2014-01-19 MED ORDER — GI COCKTAIL ~~LOC~~
30.0000 mL | Freq: Once | ORAL | Status: AC
  Administered 2014-01-19: 30 mL via ORAL
  Filled 2014-01-19: qty 30

## 2014-01-19 MED ORDER — SUCRALFATE 1 G PO TABS: 1.0000 g | ORAL_TABLET | Freq: Three times a day (TID) | ORAL | Status: DC

## 2014-01-19 MED ORDER — HYDROCODONE-ACETAMINOPHEN 5-325 MG PO TABS: 2.0000 | ORAL_TABLET | ORAL | Status: DC | PRN

## 2014-01-19 MED ORDER — OXYCODONE-ACETAMINOPHEN 5-325 MG PO TABS
1.0000 | ORAL_TABLET | Freq: Once | ORAL | Status: AC
  Administered 2014-01-19: 1 via ORAL
  Filled 2014-01-19: qty 1

## 2014-01-19 NOTE — Telephone Encounter (Signed)
Pt was seen in er this mrng, states she was informed to get a follow up today or tomorrow, pt was seen for chest pain/weakness. Pt states she had called and spoke with triage nurse on Tuesday and they informed her to go to the er, but she did not go until today. Pt states she is to weak to come in today. Would like to come tomorrow if possible. Ok to schedule.

## 2014-01-19 NOTE — Telephone Encounter (Signed)
[facility] Triage Call Report Triage Record Num: [id number] Operator: Noemi Chapel Patient Name: [name] Call Date & Time: 01/16/2014 5:01:07PM Patient Phone: [phone] PCP: Standley Brooking. Panosh Patient Gender: Female PCP Fax : [phone] Patient DOB: 1961/01/03 Practice Name: [facility] Reason for Call: Per Emergent Line: Caller: Detra/Patient; PCP: Shanon Ace (Family Practice); CB#: [phone]; Call regarding excessive weakness. Caller reports she has "weird neurological issues," can't stay awake, she is having extreme fatigue, weakness in extremities to the point she can not lift things, pupils are constricting, problems chewing and stabbing pain in her left ear at times. Symptoms began about 6 months ago, last seen by MD in March 2015. Calling this evening due to worsening of symptoms. Per EPIC, today (01/16/14) she sent note to Neurologist asking for referral to [facility]. Per Neurological Deficits Protocol, New paralysis (unable to move) or weakness (not due to pain) involving both side of the body below a specific level-caller reported weakness on both sides of her body). Activate EMS 911. Caller advised to call 911 to be seen in the ED. She is agreeable. Protocol(s) Used: Neurological Deficits Recommended Outcome per Protocol: Activate EMS 911 Reason for Outcome: New paralysis (unable to move) or weakness (not due to pain) involving both sides of body below a specific level Care Advice: ~ 06/

## 2014-01-19 NOTE — Discharge Instructions (Signed)
Chest Pain (Nonspecific) °It is often hard to give a specific diagnosis for the cause of chest pain. There is always a chance that your pain could be related to something serious, such as a heart attack or a blood clot in the lungs. You need to follow up with your caregiver for further evaluation. °CAUSES  °· Heartburn. °· Pneumonia or bronchitis. °· Anxiety or stress. °· Inflammation around your heart (pericarditis) or lung (pleuritis or pleurisy). °· A blood clot in the lung. °· A collapsed lung (pneumothorax). It can develop suddenly on its own (spontaneous pneumothorax) or from injury (trauma) to the chest. °· Shingles infection (herpes zoster virus). °The chest wall is composed of bones, muscles, and cartilage. Any of these can be the source of the pain. °· The bones can be bruised by injury. °· The muscles or cartilage can be strained by coughing or overwork. °· The cartilage can be affected by inflammation and become sore (costochondritis). °DIAGNOSIS  °Lab tests or other studies, such as X-rays, electrocardiography, stress testing, or cardiac imaging, may be needed to find the cause of your pain.  °TREATMENT  °· Treatment depends on what may be causing your chest pain. Treatment may include: °· Acid blockers for heartburn. °· Anti-inflammatory medicine. °· Pain medicine for inflammatory conditions. °· Antibiotics if an infection is present. °· You may be advised to change lifestyle habits. This includes stopping smoking and avoiding alcohol, caffeine, and chocolate. °· You may be advised to keep your head raised (elevated) when sleeping. This reduces the chance of acid going backward from your stomach into your esophagus. °· Most of the time, nonspecific chest pain will improve within 2 to 3 days with rest and mild pain medicine. °HOME CARE INSTRUCTIONS  °· If antibiotics were prescribed, take your antibiotics as directed. Finish them even if you start to feel better. °· For the next few days, avoid physical  activities that bring on chest pain. Continue physical activities as directed. °· Do not smoke. °· Avoid drinking alcohol. °· Only take over-the-counter or prescription medicine for pain, discomfort, or fever as directed by your caregiver. °· Follow your caregiver's suggestions for further testing if your chest pain does not go away. °· Keep any follow-up appointments you made. If you do not go to an appointment, you could develop lasting (chronic) problems with pain. If there is any problem keeping an appointment, you must call to reschedule. °SEEK MEDICAL CARE IF:  °· You think you are having problems from the medicine you are taking. Read your medicine instructions carefully. °· Your chest pain does not go away, even after treatment. °· You develop a rash with blisters on your chest. °SEEK IMMEDIATE MEDICAL CARE IF:  °· You have increased chest pain or pain that spreads to your arm, neck, jaw, back, or abdomen. °· You develop shortness of breath, an increasing cough, or you are coughing up blood. °· You have severe back or abdominal pain, feel nauseous, or vomit. °· You develop severe weakness, fainting, or chills. °· You have a fever. °THIS IS AN EMERGENCY. Do not wait to see if the pain will go away. Get medical help at once. Call your local emergency services (911 in [place] Do not drive yourself to the hospital. °MAKE SURE YOU:  °· Understand these instructions. °· Will watch your condition. °· Will get help right away if you are not doing well or get worse. °Document Released: 05/14/2005 Document Revised: 10/27/2011 Document Reviewed: 03/09/2008 °[employer]® Patient Information ©2014 [employer]   [employer]. ° °

## 2014-01-19 NOTE — ED Notes (Signed)
Pt was walking her dog yesterday evening and walked further than normal when she began having some chest pressure/tightness. Pt did not take any asa at home. Rates pain 8/10. Pt also c/o some progressive weakness over the past month.

## 2014-01-19 NOTE — ED Provider Notes (Signed)
[name]: [id number]     Arrival date & time 01/19/14  0148 History   First MD Initiated Contact with Patient 01/19/14 0149     Chief Complaint  Patient presents with  . Chest Pain     (Consider location/radiation/quality/duration/timing/severity/associated sxs/prior Treatment) HPI Comments: Patient presents to the ER for evaluation of multiple problems. Patient reports progressively worsening generalized weakness for years. Recently she has had difficulty performing simple task because she feels so tired and weak all over. No unilateral weakness or stroke symptoms. Patient concerned because there is a family history of ALS. She does see a neurologist, however.  Tonight patient had onset of left-sided chest pain after walking her dog. She reports that she could not walk very far. There is no shortness of breath associated with the symptoms. Patient reports that she had a heart catheterization approximately 5 years ago that did not show any blockages. Patient is a 53 y.o. female presenting with chest pain.  Chest Pain Associated symptoms: weakness     Past Medical History  Diagnosis Date  . Headache(784.0)   . IBS (irritable bowel syndrome)   . Bipolar 1 disorder     HX PSYCHOSIS  . Personality disorder     W/ BORDERLINE FEATURES  . History of duodenal ulcer   . Pelvic pain   . Complication of anesthesia     POST AGGITATION  . Frequency of urination   . Urgency of urination   . Nocturia    Past Surgical History  Procedure Laterality Date  . Laparoscopy N/A 12/14/2012    Procedure: LAPAROSCOPY DIAGNOSTIC;  Surgeon: Stark Klein, MD;  Location: [facility];  Service: General;  Laterality: N/A;  . Laparoscopic left salpingoophorectomy and lysys adhesions  03-10-2002  . Cardiovascular stress test  01-23-2011    NORMAL NUCLEAR STUDY/ NO ISCHEMIA/ EF 81%  . Transthoracic echocardiogram  10-13-2010    NORMAL LVF/  EF 55-60%  . Laparoscopic cholecystectomy  11-08-2001  . Cardiac  catheterization  10-09-1999  DR KATZ    NORMAL LVF/  NORMAL RCA/  NO CRITICAL DISEASE LEFT CORONARY SYSTEM  . Laparoscopic removal ovary remnant  2003   [place]  . Total abdominal hysterectomy  2000    W/ RIGHT SALPINGOOPHORECTOMY  . Cystoscopy with biopsy N/A 06/03/2013    Procedure: CYSTOSCOPY WITH BIOPSY  INSTILLATION OF MARCAINE AND PYRIDIUM;  Surgeon: Hanley Ben, MD;  Location: [facility];  Service: Urology;  Laterality: N/A;   Family History  Problem Relation Age of Onset  . Sleep apnea Father   . Migraines Sister     headaches  . Other Mother     MAC infection   History  Substance Use Topics  . Smoking status: Former Smoker -- 0.01 packs/day for 1 years    Types: Cigarettes    Quit date: 08/18/2009  . Smokeless tobacco: Never Used     Comment: ONLY SMOKED FOR 6 MONTHS --  QUIT  YRS AGO  . Alcohol Use: 1.8 oz/week    3 Cans of beer per week     Comment: socially    OB History   Grav Para Term Preterm Abortions TAB SAB Ect Mult Living                 Review of Systems  Cardiovascular: Positive for chest pain.  Neurological: Positive for weakness.  All other systems reviewed and are negative.     Allergies  Morphine and related  Home Medications   Prior  to Admission medications   Medication Sig Start Date End Date Taking? Authorizing Provider  lithium 300 MG tablet Take 300 mg by mouth at bedtime.    Yes Historical Provider, MD  SUMAtriptan (IMITREX) 100 MG tablet Take 100 mg by mouth every 2 (two) hours as needed for migraine or headache. May repeat in 2 hours if headache persists or recurs.   Yes Historical Provider, MD  zolpidem (AMBIEN) 5 MG tablet Take 5 mg by mouth at bedtime.    Yes Historical Provider, MD   BP 125/81  Pulse 83  Temp(Src) 97.3 F (36.3 C) (Oral)  Resp 9  Ht 5' (1.524 m)  Wt 146 lb (66.225 kg)  BMI 28.51 kg/m2  SpO2 98% Physical Exam  Constitutional: She is oriented to person, place, and time. She appears  well-developed and well-nourished. No distress.  HENT:  Head: Normocephalic and atraumatic.  Right Ear: Hearing normal.  Left Ear: Hearing normal.  Nose: Nose normal.  Mouth/Throat: Oropharynx is clear and moist and mucous membranes are normal.  Eyes: Conjunctivae and EOM are normal. Pupils are equal, round, and reactive to light.  Neck: Normal range of motion. Neck supple.  Cardiovascular: Regular rhythm, S1 normal and S2 normal.  Exam reveals no gallop and no friction rub.   No murmur heard. Pulmonary/Chest: Effort normal and breath sounds normal. No respiratory distress. She exhibits no tenderness.  Abdominal: Soft. Normal appearance and bowel sounds are normal. There is no hepatosplenomegaly. There is no tenderness. There is no rebound, no guarding, no tenderness at McBurney's point and negative Murphy's sign. No hernia.  Musculoskeletal: Normal range of motion.  Neurological: She is alert and oriented to person, place, and time. She has normal strength. No cranial nerve deficit or sensory deficit. Coordination normal. GCS eye subscore is 4. GCS verbal subscore is 5. GCS motor subscore is 6.  Skin: Skin is warm, dry and intact. No rash noted. No cyanosis.  Psychiatric: She has a normal mood and affect. Her speech is normal and behavior is normal. Thought content normal.    ED Course  Procedures (including critical care time) Labs Review Labs Reviewed  CBC WITH DIFFERENTIAL - Abnormal; Notable for the following:    Eosinophils Relative 6 (*)    All other components within normal limits  BASIC METABOLIC PANEL - Abnormal; Notable for the following:    Glucose, Bld 104 (*)    All other components within normal limits  TROPONIN I  I-STAT TROPOININ, ED    Imaging Review Dg Chest 2 View  01/19/2014   CLINICAL DATA:  Chest pain  EXAM: CHEST  2 VIEW  COMPARISON:  02/02/2013  FINDINGS: Normal heart size and mediastinal contours. No acute infiltrate or edema. No effusion or pneumothorax.  No acute osseous findings. Cholecystectomy.  IMPRESSION: No active cardiopulmonary disease.   Electronically Signed   By: Jorje Guild M.D.   On: 01/19/2014 02:49     EKG Interpretation   Date/Time:  Thursday January 19 2014 02:04:18 EDT Ventricular Rate:  85 PR Interval:  142 QRS Duration: 76 QT Interval:  387 QTC Calculation: 460 R Axis:   71 Text Interpretation:  Sinus rhythm Normal ECG Confirmed by POLLINA  MD,  CHRISTOPHER ([id number]) on 01/19/2014 2:06:35 AM      MDM   Final diagnoses:  Chest pain   Patient presents to the ER for evaluation of chest pain. Patient reports onset of pressure in her chest after walking her dog. He did not occur while walking,  it was after she came in from walking her dog and sat down that she noticed the discomfort. Reviewing the patient's records reveals multiple workups for chest pain. She has had previous cardiac catheterization and also had a dynamic stress test in 2004 that did not show any abnormality. At times she was treated for GI chest pain. Patient's EKG was normal upon arrival today. Initial troponin was negative. Repeat troponin performed, remains negative. Patient administered analgesia and is appropriate to followup with primary doctor.  Patient has a significant psychiatric history. In addition to the chest pain complaint, she is complaining of weakness all over and is concerned that she has ALS because family member had it. She does not have any neurologic deficit on exam. Patient's symptoms seem to be related to anxiety. She has a neurologist who she sees in reviewing the records, she is being referred to [facility] for a second opinion.   Orpah Greek, MD 01/19/14 [phone]

## 2014-01-19 NOTE — Telephone Encounter (Signed)
Attempted to contact pt s/p ED visit follow up call.  No ANSWER.[id number]

## 2014-01-19 NOTE — Telephone Encounter (Signed)
Agree withe Ed or neurology uncertain if there is anythin else i can evalute otherwise I

## 2014-01-25 ENCOUNTER — Telehealth: Payer: Self-pay | Admitting: Neurology

## 2014-01-25 NOTE — Telephone Encounter (Signed)
Patient has appointment at [facility], Neurological clinic on 02/23/14.  She's questioning if we could call and get an earlier appointment due to weakness has worsened.  Please call and advise.

## 2014-01-27 NOTE — Telephone Encounter (Signed)
I advised the patient to call [facility] to see if they can put her on a waiting list or to see if they had something sooner.

## 2014-02-02 ENCOUNTER — Other Ambulatory Visit: Payer: Self-pay | Admitting: Gastroenterology

## 2014-02-02 DIAGNOSIS — R7989 Other specified abnormal findings of blood chemistry: Secondary | ICD-10-CM

## 2014-02-02 DIAGNOSIS — R945 Abnormal results of liver function studies: Principal | ICD-10-CM

## 2014-02-06 ENCOUNTER — Telehealth: Payer: Self-pay | Admitting: Internal Medicine

## 2014-02-06 NOTE — Telephone Encounter (Signed)
Per Vickie from  [facility]  , [phone] Patient is calling to self referrer herself she's requesting Dr Regis Bill to put in an  Referral for her to be sent  to Hospice please advise .

## 2014-02-06 NOTE — Telephone Encounter (Signed)
i am unaware of any diagnosis that warrants hospice referral. Request denied. Why is she asking for  hospice referral?

## 2014-02-07 ENCOUNTER — Encounter: Payer: Self-pay | Admitting: Cardiovascular Disease

## 2014-02-07 ENCOUNTER — Ambulatory Visit (INDEPENDENT_AMBULATORY_CARE_PROVIDER_SITE_OTHER): Payer: BC Managed Care – PPO | Admitting: Cardiovascular Disease

## 2014-02-07 VITALS — BP 126/82 | HR 79 | Ht 60.0 in | Wt 148.1 lb

## 2014-02-07 DIAGNOSIS — R5381 Other malaise: Secondary | ICD-10-CM

## 2014-02-07 DIAGNOSIS — M940 Chondrocostal junction syndrome [Tietze]: Secondary | ICD-10-CM

## 2014-02-07 DIAGNOSIS — R7989 Other specified abnormal findings of blood chemistry: Secondary | ICD-10-CM

## 2014-02-07 DIAGNOSIS — R945 Abnormal results of liver function studies: Secondary | ICD-10-CM

## 2014-02-07 DIAGNOSIS — R5383 Other fatigue: Secondary | ICD-10-CM

## 2014-02-07 DIAGNOSIS — R0789 Other chest pain: Secondary | ICD-10-CM

## 2014-02-07 DIAGNOSIS — R079 Chest pain, unspecified: Secondary | ICD-10-CM

## 2014-02-07 DIAGNOSIS — Z13220 Encounter for screening for lipoid disorders: Secondary | ICD-10-CM

## 2014-02-07 DIAGNOSIS — E559 Vitamin D deficiency, unspecified: Secondary | ICD-10-CM

## 2014-02-07 NOTE — Patient Instructions (Signed)
Your physician has recommended you make the following change in your medication: take aleve- 2 tablets  Twice daily for 3-4 days, then 1 tablet as directed on the bottle. DO NOT TAKE ON AN EMPTY STOMACH.  Your physician has requested that you have en exercise stress myoview. For further information please visit [email or web address]. Please follow instruction sheet, as given.  Your physician recommends that you return for lab work fasting. Do not eat or drink after midnight prior to going to the lab. You will not need appointment to get blood drawn.  Your physician recommends that you schedule a follow-up appointment in: 2 months

## 2014-02-08 NOTE — Telephone Encounter (Signed)
Left a message on home/cell for the pt to return my call.

## 2014-02-09 ENCOUNTER — Ambulatory Visit
Admission: RE | Admit: 2014-02-09 | Discharge: 2014-02-09 | Disposition: A | Discharge status: Home / Self Care | Payer: BC Managed Care – PPO | Source: Ambulatory Visit | Attending: Gastroenterology | Admitting: Gastroenterology

## 2014-02-09 DIAGNOSIS — R7989 Other specified abnormal findings of blood chemistry: Secondary | ICD-10-CM

## 2014-02-09 DIAGNOSIS — R945 Abnormal results of liver function studies: Principal | ICD-10-CM

## 2014-02-09 NOTE — Telephone Encounter (Signed)
I spoke with pt and she is going to schedule a office visit to see Dr. Regis Bill and give her a update on everything that has been going on with her health issues.

## 2014-02-13 ENCOUNTER — Ambulatory Visit (INDEPENDENT_AMBULATORY_CARE_PROVIDER_SITE_OTHER): Payer: BC Managed Care – PPO | Admitting: Internal Medicine

## 2014-02-13 ENCOUNTER — Telehealth (HOSPITAL_COMMUNITY): Payer: Self-pay | Admitting: *Deleted

## 2014-02-13 ENCOUNTER — Encounter: Payer: Self-pay | Admitting: Internal Medicine

## 2014-02-13 VITALS — BP 124/74 | Temp 98.9°F | Ht 60.0 in | Wt 150.0 lb

## 2014-02-13 DIAGNOSIS — R7989 Other specified abnormal findings of blood chemistry: Secondary | ICD-10-CM

## 2014-02-13 DIAGNOSIS — R7309 Other abnormal glucose: Secondary | ICD-10-CM

## 2014-02-13 DIAGNOSIS — R531 Weakness: Secondary | ICD-10-CM

## 2014-02-13 DIAGNOSIS — K7689 Other specified diseases of liver: Secondary | ICD-10-CM

## 2014-02-13 DIAGNOSIS — Z79899 Other long term (current) drug therapy: Secondary | ICD-10-CM

## 2014-02-13 DIAGNOSIS — R5383 Other fatigue: Secondary | ICD-10-CM

## 2014-02-13 DIAGNOSIS — R82998 Other abnormal findings in urine: Secondary | ICD-10-CM

## 2014-02-13 DIAGNOSIS — F411 Generalized anxiety disorder: Secondary | ICD-10-CM

## 2014-02-13 DIAGNOSIS — N200 Calculus of kidney: Secondary | ICD-10-CM | POA: Insufficient documentation

## 2014-02-13 DIAGNOSIS — K760 Fatty (change of) liver, not elsewhere classified: Secondary | ICD-10-CM

## 2014-02-13 DIAGNOSIS — R5381 Other malaise: Secondary | ICD-10-CM

## 2014-02-13 DIAGNOSIS — E559 Vitamin D deficiency, unspecified: Secondary | ICD-10-CM

## 2014-02-13 DIAGNOSIS — R8290 Unspecified abnormal findings in urine: Secondary | ICD-10-CM

## 2014-02-13 DIAGNOSIS — R350 Frequency of micturition: Secondary | ICD-10-CM

## 2014-02-13 DIAGNOSIS — R945 Abnormal results of liver function studies: Secondary | ICD-10-CM | POA: Insufficient documentation

## 2014-02-13 DIAGNOSIS — R739 Hyperglycemia, unspecified: Secondary | ICD-10-CM

## 2014-02-13 HISTORY — DX: Fatty (change of) liver, not elsewhere classified: K76.0

## 2014-02-13 HISTORY — DX: Calculus of kidney: N20.0

## 2014-02-13 LAB — POCT URINALYSIS DIPSTICK
BILIRUBIN UA: NEGATIVE
Glucose, UA: NEGATIVE
Ketones, UA: NEGATIVE
Nitrite, UA: NEGATIVE
PH UA: 6
Protein, UA: NEGATIVE
RBC UA: NEGATIVE
Spec Grav, UA: 1.025
Urobilinogen, UA: 0.2

## 2014-02-13 NOTE — Progress Notes (Signed)
Pre visit review using our clinic review tool, if applicable. No additional management support is needed unless otherwise documented below in the visit note.  Chief Complaint  Patient presents with  . Muscle Weakness    HPI: [name]  comes in today for follow up of  multiple medical problems. And complaints Comes in today   Many issues and we requested that she come in after receiving a phone message that she requested a hospice referral  See phone note; Has been undergoing evalutaion for undefinded weakness  with weakness in her cheeks and some days having to lay down for 3 hours after minor activity Neurology  Ns disorders     [name]  Als and she is a concern she could have some serious neurologic disease or metabolic problem and the cellular love her because she is "been doing a lot of reading" she has an appointment with [facility] next week.  In the meantime she presented to the emergency department because of a new-onset type of chest pain after carrying her disabled dog inside she has a squeezing pain in her lower chest. Laboratory tests were negative for ischemia but she had a followup with Dr. Ellouise Newer. He ordered CMP CBC vitamin D and fair 10 although some of these tests have are ready Been done recently by her GYN asks if they can be done here. She is to get a stress test but declined an adenosine test because of the potential side effects uncertainly she'll be able to remain on a tread will prefers to see neurology first. No current chest pain In the meantime she noted abdominal swelling that she thinks might be ascites her GYN did laboratory tests which showed elevation of LFTs blood sugar 136 vitamin D slightly low. She asked if she has fluid in her abdomen showing a needle be taken to drain it out and find out what's going on.  She presents a lab test done a year ago at [facility] which shows a lactic acid of 2.4 slightly above the upper range of normal of 2.2. She think she  could have acidosis and her brain and at the cellular level and could be affecting how she feels Dr. Collene Mares who did not see her yet ordered an abdominal ultrasound which I do have available today showing very echogenic liver parenchyma consistent with diffuse fatty infiltration no focal abnormalities a 6 mm nonobstructing right upper pole renal calculus and is stable lobular appearance of the lower left kidney pole. No ascites is mentioned. Psych: No recent followup is only on lithium 300 mg at night. When was given Ambien her cheek muscle weakness felt better. He states her psychiatrist says that the diagnosis of bipolar is in doubt. She knows that she's been anxious and probably has obsessive thoughts. She's been told by different providers including this one that she has some agitation her sister who is a therapist that she has a manifestation but she says that doesn't explain with wrong with her.  ROS: See pertinent positives and negatives per HPI. Some urinary difficulties has had a urologic workup in a ye refill diverticulum states that she is urinating a lot more but not drinking that much more. She presents an INO report. She denies any significant supplements or vitamins except for multivitamin.  Past Medical History  Diagnosis Date  . Headache(784.0)   . IBS (irritable bowel syndrome)   . Bipolar 1 disorder     HX PSYCHOSIS  . Personality disorder  W/ BORDERLINE FEATURES  . History of duodenal ulcer   . Pelvic pain   . Complication of anesthesia     POST AGGITATION  . Frequency of urination   . Urgency of urination   . Nocturia     Family History  Problem Relation Age of Onset  . Sleep apnea Father   . Migraines Sister     headaches  . Other Mother     MAC infection    History   Social History  . Marital Status: Single    Spouse Name: N/A    Number of Children: 0  . Years of Education: college   Occupational History  . N/A     Nurse   Social History Main Topics    . Smoking status: Former Smoker -- 0.01 packs/day for 1 years    Types: Cigarettes    Quit date: 08/18/2009  . Smokeless tobacco: Never Used     Comment: ONLY SMOKED FOR 6 MONTHS --  QUIT  YRS AGO  . Alcohol Use: 1.8 oz/week    3 Cans of beer per week     Comment: socially   . Drug Use: No  . Sexual Activity: None   Other Topics Concern  . 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    no tobacco   Caffeine Use: very little, three times a week    Outpatient Encounter Prescriptions as of 02/13/2014  Medication Sig  . lithium 300 MG tablet Take 300 mg by mouth at bedtime.   [name] Kitchen zolpidem (AMBIEN) 5 MG tablet Take 5 mg by mouth at bedtime.   . [DISCONTINUED] Naproxen Sodium (ALEVE PO) Take by mouth.  . [DISCONTINUED] SUMAtriptan (IMITREX) 100 MG tablet Take 100 mg by mouth as needed for migraine or headache. May repeat in 2 hours if headache persists or recurs.    EXAM:  BP 124/74  Temp(Src) 98.9 F (37.2 C) (Oral)  Ht 5' (1.524 m)  Wt 150 lb (68.04 kg)  BMI 29.30 kg/m2  Body mass index is 29.3 kg/(m^2).  GENERAL: vitals reviewed and listed above, alert, oriented, appears well hydrated and in no acute distress with obvious central obesity HEENT: atraumatic, conjunctiva  clear, no obvious abnormalities on inspection of external nose and ears OP : no lesion edema or exudate  NECK: no obvious masses on inspection palpation  LUNGS: clear to auscultation bilaterally, no wheezes, rales or rhonchi, good air movement CV: HRRR, no clubbing cyanosis or  peripheral edema nl cap refill  Abdomen soft without obvious organomegaly guarding or rebound she does have central abdominal prominence. MS: moves all extremities without noticeable focal  abnormality no focal weakness tremor or fasciculation is obvious gait is within normal limits  which does get up slowly. PSYCH: pleasant and cooperative,   ASSESSMENT AND PLAN:  Discussed the following assessment and plan:  Weakness - Plan: Hepatic function panel, Hemoglobin A1c, Lipid panel, Protime-INR, IBC panel, Hepatitis B core antibody, total, Hepatitis B surface antibody, Hepatitis B surface antigen, Uric Acid, Lactic acid, plasma, Ferritin, IBC panel, Aldolase  Urinary frequency - Plan: POC Urinalysis Dipstick, Culture, Urine, Hepatic function panel, Hemoglobin A1c, Lipid panel, Protime-INR, IBC panel, Hepatitis B core antibody, total, Hepatitis B surface antibody, Hepatitis B surface antigen, Uric Acid, Lactic acid, plasma, Ferritin, IBC panel, Aldolase  Abnormal urine odor -  Plan: POC Urinalysis Dipstick, Culture, Urine, Hepatic function panel, Hemoglobin A1c, Lipid panel, Protime-INR, IBC panel, Hepatitis B core antibody, total, Hepatitis B surface antibody, Hepatitis B surface antigen, Uric Acid, Lactic acid, plasma, Ferritin, IBC panel, Aldolase  Abnormal liver function test - Plan: Hepatic function panel, Hemoglobin A1c, Lipid panel, Protime-INR, IBC panel, Hepatitis B core antibody, total, Hepatitis B surface antibody, Hepatitis B surface antigen, Uric Acid, Lactic acid, plasma, Ferritin, IBC panel, Aldolase  Unspecified vitamin D deficiency - Plan: Hepatic function panel, Hemoglobin A1c, Lipid panel, Protime-INR, IBC panel, Hepatitis B core antibody, total, Hepatitis B surface antibody, Hepatitis B surface antigen, Uric Acid, Lactic acid, plasma, Ferritin, IBC panel, Aldolase  Lithium use - Plan: Hepatic function panel, Hemoglobin A1c, Lipid panel, Protime-INR, IBC panel, Hepatitis B core antibody, total, Hepatitis B surface antibody, Hepatitis B surface antigen, Uric Acid, Lactic acid, plasma, Ferritin, IBC panel, Aldolase  Hyperglycemia - Plan: Hepatic function panel, Hemoglobin A1c, Lipid panel, Protime-INR, IBC panel, Hepatitis B core antibody, total, Hepatitis B surface  antibody, Hepatitis B surface antigen, Uric Acid, Lactic acid, plasma, Ferritin, IBC panel, Aldolase  Anxiety state, unspecified - Plan: Hepatic function panel, Hemoglobin A1c, Lipid panel, Protime-INR, IBC panel, Hepatitis B core antibody, total, Hepatitis B surface antibody, Hepatitis B surface antigen, Uric Acid, Lactic acid, plasma, Ferritin, IBC panel, Aldolase  Renal stone - Plan: Hepatic function panel, Hemoglobin A1c, Lipid panel, Protime-INR, IBC panel, Hepatitis B core antibody, total, Hepatitis B surface antibody, Hepatitis B surface antigen, Uric Acid, Lactic acid, plasma, Ferritin, IBC panel, Aldolase  Fatty liver disease, nonalcoholic - Presumed fatty liver gout if has serious dysfunction we'll check pro time today - Plan: Hepatic function panel, Hemoglobin A1c, Lipid panel, Protime-INR, IBC panel, Hepatitis B core antibody, total, Hepatitis B surface antibody, Hepatitis B surface antigen, Uric Acid, Lactic acid, plasma, Ferritin, IBC panel, Aldolase Patient is good and she has serious disease she certainly could have symptoms whether related to diagnoses or medication side effects in the past at present it appears she had fatty liver which shouldn't account for many of her symptoms. Although nothing is diagnostic without liver biopsy. I reassured her laboratory testings are non-alarming but consistent with we'll check hepatitis panel and other lab tests she's artery had a negative cerebral of some hepatitis C and ANA. I suggest she follow up with Dr. Collene Mares about the liver I told her she doesn't have ascites and you wouldn't want to do a tap without ascites We discussed a manifestation of session is adding to her difficulties. It is very difficult for providers and practitioner to sort through what is a reasonable workup and what is augmented by her anxiety symptoms. The bite she reports that in her new neurologist about the decreasing symptoms of her cheek muscles when she took Ambien I am  unsure of that significance she had a sleep study but it really wasn't completed because she never slept well.unknown she has teeth gritting or other extraneous movement disorder. I wonder she needs a different sleep evaluation at [facility] with camera etc. She has an asymptomatic renal stone and borderline elevated calcium levels last PTH was normal we'll repeat it today -Patient advised to return or notify health care team  if symptoms worsen ,persist or new concerns arise. I told her that hospice request was not appropriate.  Patient Instructions  mopst common cause of liver findings and abd girth would be a metabolic syndrome cw fatty liver .  Doesn't explain all fo  Your sx. Will  do labs that hav not already been done repeat the PTH and hep b serology  We are checking protime which helps define liver function .   Many of the tests that dr Claiborne Billings ordered are have already been done and some in ehr and what dr Valentino Saxon did  I think you need to fu also with dr Collene Mares about the liver issue.  Will notify you  of labs when available.  don't let anxiety define you and   Although you dont feel well I dont think hospice referral is indicated or appropriate at this time.    Standley Brooking. Panosh M.D. Total visit 68mins > 50% spent counseling and coordinating care if you did data she brought in and the electronic record went over them with her counseled to not let anxiety somatizations Hervey the situation but I think she really should be seen by neurology I am not impressed by her having a cardiac problem. She can certainly have polyuria from lithium effects on the kidneys although states she's been on a while and should not have this effect.

## 2014-02-13 NOTE — Patient Instructions (Addendum)
mopst common cause of liver findings and abd girth would be a metabolic syndrome cw fatty liver .  Doesn't explain all fo  Your sx. Will do labs that hav not already been done repeat the PTH and hep b serology  We are checking protime which helps define liver function .   Many of the tests that dr Claiborne Billings ordered are have already been done and some in ehr and what dr Valentino Saxon did  I think you need to fu also with dr Collene Mares about the liver issue.  Will notify you  of labs when available.  don't let anxiety define you and   Although you dont feel well I dont think hospice referral is indicated or appropriate at this time.

## 2014-02-14 ENCOUNTER — Encounter: Payer: Self-pay | Admitting: Cardiovascular Disease

## 2014-02-14 ENCOUNTER — Telehealth: Payer: Self-pay | Admitting: Internal Medicine

## 2014-02-14 ENCOUNTER — Telehealth: Payer: Self-pay | Admitting: Neurology

## 2014-02-14 DIAGNOSIS — R0789 Other chest pain: Secondary | ICD-10-CM | POA: Insufficient documentation

## 2014-02-14 DIAGNOSIS — M940 Chondrocostal junction syndrome [Tietze]: Secondary | ICD-10-CM

## 2014-02-14 HISTORY — DX: Chondrocostal junction syndrome (tietze): M94.0

## 2014-02-14 LAB — URIC ACID: URIC ACID, SERUM: 4.5 mg/dL (ref 2.4–7.0)

## 2014-02-14 LAB — LACTIC ACID, PLASMA: LACTIC ACID: 2 mmol/L (ref 0.5–2.2)

## 2014-02-14 LAB — IBC PANEL
Iron: 84 ug/dL (ref 42–145)
Iron: 83 ug/dL (ref 42–145)
SATURATION RATIOS: 21.4 % (ref 20.0–50.0)
Saturation Ratios: 21.2 % (ref 20.0–50.0)
Transferrin: 280.4 mg/dL (ref 212.0–360.0)
Transferrin: 279.9 mg/dL (ref 212.0–360.0)

## 2014-02-14 LAB — HEPATIC FUNCTION PANEL
ALT: 89 U/L — ABNORMAL HIGH (ref 0–35)
AST: 64 U/L — ABNORMAL HIGH (ref 0–37)
Albumin: 4.4 g/dL (ref 3.5–5.2)
Alkaline Phosphatase: 99 U/L (ref 39–117)
BILIRUBIN DIRECT: 0.1 mg/dL (ref 0.0–0.3)
BILIRUBIN TOTAL: 0.8 mg/dL (ref 0.2–1.2)
Total Protein: 7.7 g/dL (ref 6.0–8.3)

## 2014-02-14 LAB — HEPATITIS B CORE ANTIBODY, TOTAL: HEP B C TOTAL AB: REACTIVE — AB

## 2014-02-14 LAB — LIPID PANEL
CHOLESTEROL: 201 mg/dL — AB (ref 0–200)
HDL: 42.3 mg/dL (ref >39.00)
LDL CALC: 109 mg/dL — AB (ref 0–99)
NonHDL: 158.7
TRIGLYCERIDES: 251 mg/dL — AB (ref 0.0–149.0)
Total CHOL/HDL Ratio: 5
VLDL: 50.2 mg/dL — ABNORMAL HIGH (ref 0.0–40.0)

## 2014-02-14 LAB — FERRITIN: Ferritin: 89.6 ng/mL (ref 10.0–291.0)

## 2014-02-14 LAB — PROTIME-INR
INR: 1 ratio (ref 0.8–1.0)
Prothrombin Time: 10.7 s (ref 9.6–13.1)

## 2014-02-14 LAB — HEMOGLOBIN A1C: Hgb A1c MFr Bld: 5.9 % (ref 4.6–6.5)

## 2014-02-14 LAB — HEPATITIS B SURFACE ANTIBODY,QUALITATIVE: HEP B S AB: POSITIVE — AB

## 2014-02-14 LAB — HEPATITIS B SURFACE ANTIGEN: HEP B S AG: NEGATIVE

## 2014-02-14 NOTE — Progress Notes (Signed)
Patient ID: [name], female   DOB: 1961/08/07, 53 y.o.   MRN: [id number]     PATIENT PROFILE: [name] is a 53 y.o. female who is referred by Dr. Aloha Gell for evaluation of chest pain and pressure.   HPI:  [name] is a 53 y.o. female who denies any known history of prior cardiac disease.  She states that she has felt overall weakness over the last 3-4 years, but recently she has noticed increasing symptomatology.  She has noticed a left-sided chest squeezing, like sensation, which is not typically exertionally precipitated.  However, she has noticed some mild shortness of breath.  Apparently, she recently presented to the emergency room with chest pressure which began when she was walking her dog. She states that she was hypertensive with a blood pressure 174/114.  However, blood pressure recorded in the ER shows this to be 125/88 after she had been there for while waiting. A chest x-ray did not reveal any active cardiopulmonary disease.  She saw Dr. Aloha Gell on June 8.  Laboratory revealed a BUN of 18 treading 0.81.  LFTs were elevated with an ALT of 131 and AST of 60.  Hemoglobin 13.5, hematocrit 40.4.  Because of her chest pain, history she is referred for cardiology evaluation.  According to the ER records, the patient did have a previous cardiac catheterization many years ago.  She has been concerned about her generalized weakness and has a family history for ALS.  Past Medical History  Diagnosis Date  . Headache(784.0)   . IBS (irritable bowel syndrome)   . Bipolar 1 disorder     HX PSYCHOSIS  . Personality disorder     W/ BORDERLINE FEATURES  . History of duodenal ulcer   . Pelvic pain   . Complication of anesthesia     POST AGGITATION  . Frequency of urination   . Urgency of urination   . Nocturia     Past Surgical History  Procedure Laterality Date  . Laparoscopy N/A 12/14/2012    Procedure: LAPAROSCOPY DIAGNOSTIC;  Surgeon: Stark Klein,  MD;  Location: [facility];  Service: General;  Laterality: N/A;  . Laparoscopic left salpingoophorectomy and lysys adhesions  03-10-2002  . Cardiovascular stress test  01-23-2011    NORMAL NUCLEAR STUDY/ NO ISCHEMIA/ EF 81%  . Transthoracic echocardiogram  10-13-2010    NORMAL LVF/  EF 55-60%  . Laparoscopic cholecystectomy  11-08-2001  . Cardiac catheterization  10-09-1999  DR KATZ    NORMAL LVF/  NORMAL RCA/  NO CRITICAL DISEASE LEFT CORONARY SYSTEM  . Laparoscopic removal ovary remnant  2003   [place]  . Total abdominal hysterectomy  2000    W/ RIGHT SALPINGOOPHORECTOMY  . Cystoscopy with biopsy N/A 06/03/2013    Procedure: CYSTOSCOPY WITH BIOPSY  INSTILLATION OF MARCAINE AND PYRIDIUM;  Surgeon: Hanley Ben, MD;  Location: [facility];  Service: Urology;  Laterality: N/A;    Allergies  Allergen Reactions  . Morphine And Related Nausea And Vomiting    Current Outpatient Prescriptions  Medication Sig Dispense Refill  . lithium 300 MG tablet Take 300 mg by mouth at bedtime.       [name] Kitchen zolpidem (AMBIEN) 5 MG tablet Take 5 mg by mouth at bedtime.        No current facility-administered medications for this visit.   Socially , she is single no children.  She has a college degree.  She currently is unemployed.  Family History  Problem Relation Age of Onset  . Sleep apnea Father   . Migraines Sister     headaches  . Other Mother     MAC infection    ROS General: Positive for generalized weakness; Negative; No fevers, chills, or night sweats HEENT: Negative; No changes in vision or hearing, sinus congestion, difficulty swallowing Pulmonary: Negative; No cough, wheezing, shortness of breath, hemoptysis Cardiovascular:  See HPI; No chest pain, presyncope, syncope, palpitations, edema GI: Positive for occasional abdominal distention; No nausea, vomiting, diarrhea,  GU: Negative; No dysuria, hematuria, or difficulty voiding Musculoskeletal: Positive for muscle  fatigue;  Hematologic/Oncologic: Negative; no easy bruising, bleeding Endocrine: Negative; no heat/cold intolerance; no diabetes Neuro: Positive for history of migraine headaches no changes in balance,  Skin: Negative; No rashes or skin lesions Psychiatric: Negative; No behavioral problems, depression Sleep: Negative; No daytime sleepiness, hypersomnolence, bruxism, restless legs, hypnogagnic hallucinations Other comprehensive 14 point system review is negative   Physical Exam BP 126/82  Pulse 79  Ht 5' (1.524 m)  Wt 148 lb 1.6 oz (67.178 kg)  BMI 28.92 kg/m2 General: Alert, oriented, no distress.  Skin: normal turgor, no rashes, warm and dry HEENT: Normocephalic, atraumatic. Pupils equal round and reactive to light; sclera anicteric; extraocular muscles intact; Fundi no AV nicking, hemorrhages, or Nose without nasal septal hypertrophy Mouth/Parynx benign; Mallinpatti scale 2 Neck: No JVD, no carotid bruits; normal carotid upstroke Lungs: clear to ausculatation and percussion; no wheezing or rales Chest wall: Significant left costochondral tenderness to palpation Heart: PMI not displaced, RRR, s1 s2 normal, no systolic murmur, no diastolic murmur, no rubs, gallops, thrills, or heaves Abdomen: soft, nontender; no hepatosplenomehaly, BS+; abdominal aorta nontender and not dilated by palpation. Back: no CVA tenderness Pulses 2+ Musculoskeletal: full range of motion, normal strength, no joint deformities Extremities: no clubbing cyanosis or edema, Homan's sign negative  Neurologic: grossly nonfocal; Cranial nerves grossly wnl Psychologic: Normal mood and affect   ECG (independently read by me):rmal sinus rhythm at 79 beats per minute.  Normal intervals.  No ST changes.  LABS:  BMET    Component Value Date/Time   NA 142 01/19/2014 0243   K 4.3 01/19/2014 0243   CL 105 01/19/2014 0243   CO2 22 01/19/2014 0243   GLUCOSE 104* 01/19/2014 0243   GLUCOSE 98 07/17/2006 1126   BUN 11 01/19/2014  0243   CREATININE 0.59 01/19/2014 0243   CREATININE 0.68 11/08/2012 1110   CALCIUM 9.9 01/19/2014 0243   GFRNONAA >90 01/19/2014 0243   GFRAA >90 01/19/2014 0243     Hepatic Function Panel     Component Value Date/Time   PROT 7.7 02/13/2014 1645   ALBUMIN 4.4 02/13/2014 1645   AST 64* 02/13/2014 1645   ALT 89* 02/13/2014 1645   ALKPHOS 99 02/13/2014 1645   BILITOT 0.8 02/13/2014 1645   BILIDIR 0.1 02/13/2014 1645   IBILI 0.3 09/16/2010 2155     CBC    Component Value Date/Time   WBC 6.5 01/19/2014 0243   RBC 4.23 01/19/2014 0243   HGB 12.9 01/19/2014 0243   HCT 38.4 01/19/2014 0243   PLT 264 01/19/2014 0243   MCV 90.8 01/19/2014 0243   MCH 30.5 01/19/2014 0243   MCHC 33.6 01/19/2014 0243   RDW 12.9 01/19/2014 0243   LYMPHSABS 1.7 01/19/2014 0243   MONOABS 0.8 01/19/2014 0243   EOSABS 0.4 01/19/2014 0243   BASOSABS 0.0 01/19/2014 0243     BNP No results found for this basename: probnp  Lipid Panel     Component Value Date/Time   CHOL 201* 02/13/2014 1645   TRIG 251.0* 02/13/2014 1645   HDL 42.30 02/13/2014 1645   CHOLHDL 5 02/13/2014 1645   VLDL 50.2* 02/13/2014 1645   LDLCALC 109* 02/13/2014 1645      RADIOLOGY: Dg Chest 2 View  01/19/2014   CLINICAL DATA:  Chest pain  EXAM: CHEST  2 VIEW  COMPARISON:  02/02/2013  FINDINGS: Normal heart size and mediastinal contours. No acute infiltrate or edema. No effusion or pneumothorax. No acute osseous findings. Cholecystectomy.  IMPRESSION: No active cardiopulmonary disease.   Electronically Signed   By: Jorje Guild M.D.   On: 01/19/2014 02:49   US Abdomen Complete  02/09/2014   CLINICAL DATA:  Elevated liver function tests, abdominal distention  EXAM: ULTRASOUND ABDOMEN COMPLETE  COMPARISON:  CT abdomen pelvis of 11/16/2012, and ultrasound abdomen of 09/16/2012  FINDINGS: Gallbladder:  The gallbladder has previously been resected. There is no pain over the gallbladder with compression.  Common bile duct:  Diameter: The common bile duct is normal measuring  4.3 mm in diameter.  Liver:  The liver is diffusely echogenic consistent with fatty infiltration. No focal abnormality is seen.  IVC:  No abnormality visualized.  Pancreas:  The pancreas is largely obscured by bowel gas.  Spleen:  The spleen is normal measuring 7.3 cm sagittally.  Right Kidney:  Length: 10.9 cm. A tiny cyst is present in the upper pole of 8 mm. And echodensity is noted in the upper pole consistent with a nonobstructing 6 mm right upper pole renal calculus.  Left Kidney:  Length: 10.5 cm. No hydronephrosis is seen. A lobular area is again noted in the lower pole of the left kidney and is unchanged, most consistent with normal lobular renal parenchyma. CT or MRI could be performed if further assessment is warranted clinically.  Abdominal aorta:  The abdominal aorta is normal in caliber.  Other findings:  None.  IMPRESSION: 1. Very echogenic liver parenchyma consistent with diffuse fatty infiltration. No focal abnormality. 2. The pancreas is completely obscured by bowel gas. 3. 6 mm nonobstructing right upper pole renal calculus. 4. No change in lobular appearance of the parenchyma of the lower pole of the left kidney when compared to the ultrasound in 2014, most consistent with normal renal parenchyma. If further assessment is warranted MR would be the preferred method of evaluation.   Electronically Signed   By: Ivar Drape M.D.   On: 02/09/2014 09:48     ASSESSMENT AND PLAN: Ms. Toshika Parrow is a 53 year old female who has experienced generalized weakness for several years.  Recently, she has developed episodes of chest discomfort, which she describes as a pressure and a squeezing sensation.  She recently was evaluated in the emergency room for this discomfort.  It was not felt to be ischemic.  Her physical examination she does have significant costochondral tenderness raising the possibility of significant musculoskeletal etiology.  However, she has also evidence for probable diffuse fatty  infiltration of her liver with previously documented elevated LFTs.  She describes chest pressure with activity, but also occurring at any time.  Her EKG does not show acute ST segment changes.  Her recent abdominal ultrasound did show fatty liver, as well as a nonobstructing right upper pole renal calculus.  Presently, I am recommending that she take over-the-counter Naprosyn 400 mg twice a day for 3-4 days and then to reduce this to 200 mg twice a day.  She  will take this with food and not on an empty stomach.  Because of her recurrent chest pressure symptomatology,      I am recommending more definitive evaluation for potential ischemic etiology and was scheduled her for an exercise Myoview.  Repeat blood work will be obtained.  I will see her back in my office in 2 months for followup evaluation   Troy Sine, MD, [facility] 02/14/2014 8:12 PM

## 2014-02-14 NOTE — Telephone Encounter (Signed)
Spoke with patient and she said that she would like to get a referral to see Dr Narda Amber [phone] because she is getting weaker and weaker.  Has mentioned this to her PCP to see Dr Narda Amber and will contact to try to get the referral. She did see Dr Linus Mako once but wants some place closer. Patient said that she will contact her pcp to try to get referral first, will call back if unable to get it thru their office.

## 2014-02-14 NOTE — Telephone Encounter (Signed)
Pt would like to know if her referral for neurology can go [facility], Dr. Narda Amber?? She states that office is closer for her to get to.

## 2014-02-14 NOTE — Telephone Encounter (Signed)
Patient calling to request that a referral be sent to Dr. Narda Amber since she is closer to where patient lives and patient has limited transportation. Please call patient and advise.

## 2014-02-14 NOTE — Telephone Encounter (Signed)
Pt is wanting to know if dr.panosh can refer her to dr. Arvin Collard patel at [facility], pt states that the referral to [facility] is just to far and she has no one to get her there. Pt states she just don't have the physical strength to go that far.

## 2014-02-14 NOTE — Telephone Encounter (Signed)
i dont think that is a good idea. She can ask dr Leonie Man what he thinks and he can refer as appropriate.

## 2014-02-15 ENCOUNTER — Telehealth (HOSPITAL_COMMUNITY): Payer: Self-pay

## 2014-02-15 LAB — URINE CULTURE: Colony Count: >=100000

## 2014-02-15 NOTE — Telephone Encounter (Signed)
See other note

## 2014-02-15 NOTE — Telephone Encounter (Signed)
Spoke to the pt.  Advised that she speak to Dr. Leonie Man about referral to Dr. Serita Grit office.  She wanted Dr. Regis Bill to know that Dr. Leonie Man is not in the office this week but his nurse/assistant stated that it was okay for Dr. Regis Bill to do referral.  I told her that it was not a matter as to whether or not it was okay to do the referral but that Dr. Clydene Fake office should do the referral when he returns if appropriate.  Stated again that she wanted me to send a message to Dr. Regis Bill to inform her that Dr. Leonie Man is not in the office this week.  Advised that she may leave a message with Dr. Clydene Fake nurse/assistant for when he returns next week to ask about referral.  Will send note to [facility].

## 2014-02-15 NOTE — Telephone Encounter (Signed)
Encounter complete. 

## 2014-02-15 NOTE — Telephone Encounter (Signed)
The patient has a right to transfer neurological care to another neurologist  If she wishes but does not need a referral from me and can call dr Posey Pronto directly or else get primary care to refer if necessary

## 2014-02-15 NOTE — Telephone Encounter (Signed)
Patient calling back to state that her PCP is unable to make the referral. Please return call to patient and advise.

## 2014-02-16 ENCOUNTER — Telehealth: Payer: Self-pay | Admitting: Neurology

## 2014-02-16 ENCOUNTER — Telehealth: Payer: Self-pay | Admitting: Internal Medicine

## 2014-02-16 LAB — ALDOLASE: Aldolase: 9.4 U/L — ABNORMAL HIGH (ref <=8.1)

## 2014-02-16 NOTE — Telephone Encounter (Signed)
Ask patient  to call Dr. Posey Pronto directly and see if she will see her.

## 2014-02-16 NOTE — Telephone Encounter (Signed)
Note not needed.  WP has a note.

## 2014-02-16 NOTE — Telephone Encounter (Signed)
Called and left VM message per Dr Clydene Fake note below

## 2014-02-16 NOTE — Telephone Encounter (Signed)
Patient stated PCP will not give referral to Dr Posey Pronto.  Please call and advise

## 2014-02-16 NOTE — Telephone Encounter (Signed)
Called pt and pt stated that her PCP physician will not give her a referral to see Dr. Posey Pronto. Per phone note on 02/14/14, I informed the pt per Dr. Leonie Man that it was all right for the pt to transfer neurologica care to another neurologist, if she wishes, but pt does not need a referral from Dr. Leonie Man and that the pt could call Dr. Posey Pronto directly or PCP to get referral. Please advise

## 2014-02-16 NOTE — Telephone Encounter (Signed)
Pt would like a referral to dr patel neurologist for weakness. Pt was seeing dr Leonie Man and no longer would like to see him.

## 2014-02-20 ENCOUNTER — Telehealth: Payer: Self-pay | Admitting: Internal Medicine

## 2014-02-20 NOTE — Telephone Encounter (Signed)
Pt is needing her lab results and dr. Regis Bill notes sent to the neurologist dr. Mariella Saa fax to [phone], pt will be seeing him on Thursday 02/23/14 pt also going for a cardio stress test on tomorrow and pt wants to make sure dr. Claiborne Billings gets the lab results.

## 2014-02-20 NOTE — Telephone Encounter (Signed)
Called pt and left message per Dr. Leonie Man for the pt to call Dr. Posey Pronto directly and see if the doctor will see her and if she has any other problems, questions or concerns to call the office.

## 2014-02-21 ENCOUNTER — Telehealth: Payer: Self-pay | Admitting: Cardiovascular Disease

## 2014-02-21 ENCOUNTER — Ambulatory Visit (HOSPITAL_COMMUNITY)
Admission: RE | Admit: 2014-02-21 | Discharge: 2014-02-21 | Disposition: A | Discharge status: Home / Self Care | Payer: BC Managed Care – PPO | Source: Ambulatory Visit | Attending: Internal Medicine | Admitting: Internal Medicine

## 2014-02-21 DIAGNOSIS — R002 Palpitations: Secondary | ICD-10-CM | POA: Insufficient documentation

## 2014-02-21 DIAGNOSIS — R0989 Other specified symptoms and signs involving the circulatory and respiratory systems: Secondary | ICD-10-CM | POA: Insufficient documentation

## 2014-02-21 DIAGNOSIS — R0609 Other forms of dyspnea: Secondary | ICD-10-CM | POA: Insufficient documentation

## 2014-02-21 DIAGNOSIS — R079 Chest pain, unspecified: Secondary | ICD-10-CM | POA: Insufficient documentation

## 2014-02-21 DIAGNOSIS — R42 Dizziness and giddiness: Secondary | ICD-10-CM | POA: Insufficient documentation

## 2014-02-21 DIAGNOSIS — Z87891 Personal history of nicotine dependence: Secondary | ICD-10-CM | POA: Insufficient documentation

## 2014-02-21 DIAGNOSIS — R55 Syncope and collapse: Secondary | ICD-10-CM | POA: Insufficient documentation

## 2014-02-21 MED ORDER — TECHNETIUM TC 99M SESTAMIBI GENERIC - CARDIOLITE
30.8000 | Freq: Once | INTRAVENOUS | Status: AC | PRN
  Administered 2014-02-21: 30.8 via INTRAVENOUS

## 2014-02-21 MED ORDER — TECHNETIUM TC 99M SESTAMIBI GENERIC - CARDIOLITE
10.5000 | Freq: Once | INTRAVENOUS | Status: AC | PRN
  Administered 2014-02-21: 11 via INTRAVENOUS

## 2014-02-21 NOTE — Telephone Encounter (Signed)
Patient called with anxiety about her elevated lipid panel. She would like for Dr. Claiborne Billings to review and advise. She informs me that although it is under Dr. Velora Mediate name, Dr. Claiborne Billings was the ordering  MD. Informed her that Dr. Claiborne Billings is on Vacation this week and will address once he returns. I will however need to inform him to review the results in EPIC because they will not go into his inbox. Note sent to Dr. Claiborne Billings for review.

## 2014-02-21 NOTE — Telephone Encounter (Signed)
Pt wants to make sure you get her lab work that she had on 6-29. Dr Shanon Ace is her doctor at [facility].Pt is having a stress test today and she wants to make sure somebody looks at her Lipid Profile.

## 2014-02-21 NOTE — Procedures (Addendum)
[place] [address] [place] [address] [phone]  Cardiology Nuclear Med Study  [name] is a 53 y.o. female     MRN : [id number]     DOB: 14-Aug-1961  Procedure Date: 02/21/2014  Nuclear Med Background Indication for Stress Test:  Evaluation for Ischemia and [facility] History:  No prior cardiac or respiratory history reported; Last NUC MPI on 09/16/1999-nonischemic;EF=65% Cardiac Risk Factors: History of Smoking, Lipids and Overweight  Symptoms:  Chest Pain, DOE, Fatigue, Light-Headedness, Nausea, Near Syncope, Palpitations, SOB and weakness   Nuclear Pre-Procedure Caffeine/Decaff Intake:  1:00am NPO After: 11am   IV Site: R Forearm  IV 0.9% NS with Angio Cath:  22g  Chest Size (in):  n/a IV Started by: Rolene Course, RN  Height: 5' (1.524 m)  Cup Size: D  BMI:  Body mass index is 28.9 kg/(m^2). Weight:  148 lb (67.132 kg)   Tech Comments:  n/a    Nuclear Med Study 1 or 2 day study: 1 day  Stress Test Type:  Stress  Order Authorizing Provider:  Shelva Majestic, MD   Resting Radionuclide: Technetium 26m Sestamibi  Resting Radionuclide Dose: 10.5 mCi   Stress Radionuclide:  Technetium 30m Sestamibi  Stress Radionuclide Dose: 30.8 mCi           Stress Protocol Rest HR: 90 Stress HR: 155  Rest BP: 136/81 Stress BP: 136/81  Exercise Time (min): 6:40 METS: 7.90          Dose of Adenosine (mg):  n/a Dose of Lexiscan: n/a mg  Dose of Atropine (mg): n/a Dose of Dobutamine: n/a mcg/kg/min (at max HR)  Stress Test Technologist: Mellody Memos, CCT Nuclear Technologist: Imagene Riches, CNMT   Rest Procedure:  Myocardial perfusion imaging was performed at rest 45 minutes following the intravenous administration of Technetium 3m Sestamibi. Stress Procedure:  The patient performed treadmill exercise using a Bruce  Protocol for 6 minutes 40 seconds. The patient stopped due to generalized fatigue. Patient  denied any chest pain.  There were no significant ST-T wave changes.  Technetium 76m Sestamibi was injected IV at peak exercise and myocardial perfusion imaging was performed after a brief delay.  Transient Ischemic Dilatation (Normal <1.22):  1.26  QGS EDV:  35 ml QGS ESV:  8 ml LV Ejection Fraction: 77%  Rest ECG: NSR - Normal EKG  Stress ECG: No significant change from baseline ECG  QPS Raw Data Images:  Normal; no motion artifact; normal heart/lung ratio. Stress Images:  Normal homogeneous uptake in all areas of the myocardium. Rest Images:  Normal homogeneous uptake in all areas of the myocardium. Subtraction (SDS):  Normal  Impression Exercise Capacity:  Good exercise capacity. BP Response:  Normal blood pressure response. Clinical Symptoms:  No significant symptoms noted. ECG Impression:  No significant ST segment change suggestive of ischemia. Comparison with Prior Nuclear Study: No significant change from previous study  Overall Impression:  Normal stress nuclear study.  LV Wall Motion:  NL LV Function; NL Wall Motion; EF 77%  Pixie Casino, MD, [facility] Board Certified in Nuclear Cardiology Attending Cardiologist [place], MD  02/21/2014 4:35 PM

## 2014-02-22 NOTE — Telephone Encounter (Signed)
See results

## 2014-02-22 NOTE — Telephone Encounter (Signed)
Please make sure all of this is done

## 2014-02-27 ENCOUNTER — Telehealth: Payer: Self-pay | Admitting: Internal Medicine

## 2014-02-27 ENCOUNTER — Encounter: Payer: Self-pay | Admitting: Internal Medicine

## 2014-02-27 ENCOUNTER — Ambulatory Visit (INDEPENDENT_AMBULATORY_CARE_PROVIDER_SITE_OTHER): Payer: BC Managed Care – PPO | Admitting: Internal Medicine

## 2014-02-27 VITALS — BP 120/80 | Temp 98.1°F | Wt 149.0 lb

## 2014-02-27 DIAGNOSIS — R3919 Other difficulties with micturition: Secondary | ICD-10-CM

## 2014-02-27 DIAGNOSIS — R3989 Other symptoms and signs involving the genitourinary system: Secondary | ICD-10-CM

## 2014-02-27 DIAGNOSIS — R6883 Chills (without fever): Secondary | ICD-10-CM

## 2014-02-27 DIAGNOSIS — R399 Unspecified symptoms and signs involving the genitourinary system: Secondary | ICD-10-CM

## 2014-02-27 DIAGNOSIS — R39198 Other difficulties with micturition: Secondary | ICD-10-CM

## 2014-02-27 LAB — POCT URINALYSIS DIPSTICK
BILIRUBIN UA: NEGATIVE
Blood, UA: NEGATIVE
Glucose, UA: NEGATIVE
Ketones, UA: NEGATIVE
LEUKOCYTES UA: NEGATIVE
NITRITE UA: NEGATIVE
Protein, UA: NEGATIVE
Spec Grav, UA: 1.01
Urobilinogen, UA: 0.2
pH, UA: 6

## 2014-02-27 MED ORDER — SULFAMETHOXAZOLE-TMP DS 800-160 MG PO TABS: 1.0000 | ORAL_TABLET | Freq: Two times a day (BID) | ORAL | Status: DC

## 2014-02-27 NOTE — Patient Instructions (Addendum)
You could have a partly treated uti  Multiple species on urine cultures just means that cannot interpret the test  Not that miutiple bacteria are causing a problem A catheterized urine is the only way to rule that out but not practical in the primary care office .    Can take 5 days of antibiotic for now .   Autonomic dysfunction can give one  Bladder and gi motility  Dysfunction.and is not infection per se.  Will fax information to neurologist .

## 2014-02-27 NOTE — Telephone Encounter (Signed)
Patient Information:  Caller Name: Xylina Rhoads  Phone: [phone]  Patient: [name]  Gender: Female  DOB: [birth year]  Age: 53 Years  PCP: Shanon Ace [facility])  Pregnant: No  Office Follow Up:  Does the office need to follow up with this patient?: No  Instructions For The Office: N/A  RN Note:  Appt. scheduled for this pt. at 13:30 today.  Symptoms  Reason For Call & Symptoms: Pt. was advised  to go to the ED on 02/25/14. Pt. did not go. Pt. had severe chills starting on 02/24/14  that continued into 02/25/14. Started to run a fever of 99.4 and took Tylenol.States she thought she was getting septic. Was seen in the office for UTI 2 weeks ago(02/13/14), but was not treated. Pt. has hx of these. Pt. started on Bactrim on 02/25/14 that she had at home. Pt. is seeing a Neurologist at [facility] for auto-immune and Autonomic Nervous System Disease. Pt. only had 2 pills on hand of the Bactrim and is worried that is not enough.  Reviewed Health History In EMR: Yes  Reviewed Medications In EMR: Yes  Reviewed Allergies In EMR: Yes  Reviewed Surgeries / Procedures: Yes  Date of Onset of Symptoms: 02/24/2014  Treatments Tried: Bactrim DS  Treatments Tried Worked: Yes OB / GYN:  LMP: Unknown  Guideline(s) Used:  Urination Pain - Female  Disposition Per Guideline:   See Today in Office  Reason For Disposition Reached:   > 2 UTIs in last year  Advice Given:  Fluids:   Drink extra fluids. Drink 8-10 glasses of liquids a day (Reason: to produce a dilute, non-irritating urine).  Cranberry Juice:   Some people think that drinking cranberry juice may help in fighting urinary tract infections. However, there is no good research that has ever proved this.  Dosage Cranberry Juice Cocktail: 8 oz (240 ml) twice a day.  Warm Saline SITZ Baths to Reduce Pain:  Sit in a warm saline bath for 20 minutes to cleanse the area and to reduce pain. Add 2 oz. of table salt or baking soda to  a tub of water.  Call Back If:  You become worse.  Patient Will Follow Care Advice:  YES  Appointment Scheduled:  02/27/2014 13:30:00 Appointment Scheduled Provider:  Shanon Ace [facility])

## 2014-02-27 NOTE — Telephone Encounter (Signed)
Noted  

## 2014-02-27 NOTE — Progress Notes (Signed)
Chief Complaint  Patient presents with  . Chills  . Fever  . Dysuria    HPI: Patient comes in today for SDA for recurrent problem evaluation. Over weekend . Se [facility] note didn't want to go to ed  unlsess absolutely needed   Had stress test cards Saw neuro at [facility]  And was" Awsome" lots of stress may be referred to [facility]. For autonomic neuropathy  Type problem  Friday had chills .  Feverish not registering and then Saturday  Couldn't get warm and fever  99.4   And tylenol. Sparingly  Self rx with bactrim  Once each day   And felt better  somewhat   Bad sweats .   Feels "cant pee"  Drinking lots of water  .  Last void a few hours ago.  No voimiting  Says vit b12 is high and concern it could be cancer  Liver disease.   ROS: See pertinent positives and negatives per HPI.  concerned that she could have cancer cause her b12 was up at the neurologist . Other issues about MG or autonomic syndrome.   Was taken off of the lithium  Doesn't notice improvement.   Past Medical History  Diagnosis Date  . Headache(784.0)   . IBS (irritable bowel syndrome)   . Bipolar 1 disorder     HX PSYCHOSIS  . Personality disorder     W/ BORDERLINE FEATURES  . History of duodenal ulcer   . Pelvic pain   . Complication of anesthesia     POST AGGITATION  . Frequency of urination   . Urgency of urination   . Nocturia     Family History  Problem Relation Age of Onset  . Sleep apnea Father   . Migraines Sister     headaches  . Other Mother     MAC infection    History   Social History  . Marital Status: Single    Spouse Name: N/A    Number of Children: 0  . Years of Education: college   Occupational History  . N/A     Nurse   Social History Main Topics  . Smoking status: Former Smoker -- 0.01 packs/day for 1 years    Types: Cigarettes    Quit date: 08/18/2009  . Smokeless tobacco: Never Used     Comment: ONLY SMOKED FOR 6 MONTHS --  QUIT  YRS AGO  . Alcohol Use: 1.8 oz/week   3 Cans of beer per week     Comment: socially   . Drug Use: No  . Sexual Activity: None   Other Topics Concern  . 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    no tobacco   Caffeine Use: very little, three times a week    Outpatient Encounter Prescriptions as of 02/27/2014  Medication Sig  . zolpidem (AMBIEN) 5 MG tablet Take 5 mg by mouth at bedtime.   . sulfamethoxazole-trimethoprim (BACTRIM DS) 800-160 MG per tablet Take 1 tablet by mouth 2 (two) times daily.  . [DISCONTINUED] lithium 300 MG tablet Take 300 mg by mouth at bedtime.     EXAM:  BP 120/80  Temp(Src) 98.1 F (36.7 C) (Oral)  Wt 149 lb (67.586 kg)  Body mass index is 29.1 kg/(m^2).  GENERAL: vitals reviewed and listed above, alert, oriented, appears  well hydrated and in no acute distress non toxic ambulatory  HEENT: atraumatic, conjunctiva  clear, no obvious abnormalities on inspection of external nose and ears NECK: no obvious masses on inspection palpation  LUNGS: clear to auscultation bilaterally, no wheezes, rales or rhonchi, t CV: HRRR, no clubbing cyanosis or  peripheral edema nl cap refill Abdomen:  Sof,t normal bowel sounds without hepatosplenomegaly, no guarding rebound or masses no CVA tenderness Skin: normal capillary refill ,turgor , color: No acute rashes ,petechiae or bruising MS: moves all extremities without noticeable focal  abnormality PSYCH: pleasant and cooperative, no obvious depression or anxiety  ASSESSMENT AND PLAN:  Discussed the following assessment and plan:  Urinary dysfunction - Plan: Culture, Urine, POC Urinalysis Dipstick  Lower urinary tract symptoms (LUTS) - Plan: Culture, Urine, POC Urinalysis Dipstick  Chills - Plan: Culture, Urine, POC Urinalysis Dipstick Partial self rx  Felt better  Cannot say dx   Empiric rx after  cx. Uncertain if  Had appt with her Gi about the abnormal Liver tests ( dr Collene Mares or other) -Patient advised to return or notify health care team  if symptoms worsen ,persist or new concerns arise.  Patient Instructions  You could have a partly treated uti  Multiple species on urine cultures just means that cannot interpret the test  Not that miutiple bacteria are causing a problem A catheterized urine is the only way to rule that out but not practical in the primary care office .    Can take 5 days of antibiotic for now .   Autonomic dysfunction can give one  Bladder and gi motility  Dysfunction.and is not infection per se.  Will fax information to neurologist .     Standley Brooking. Vincie Linn M.D.

## 2014-03-01 ENCOUNTER — Telehealth: Payer: Self-pay | Admitting: Family Medicine

## 2014-03-01 NOTE — Telephone Encounter (Signed)
Message copied by Hulda Humphrey on Wed Mar 01, 2014  8:49 AM ------      Message from: [facility] K      Created: Wed Mar 01, 2014  8:29 AM      Regarding: send to new neurologist at [place] copy of note to new neurologist at [facility]   On care teams   I cant get the note to route off of my EPIC.      Thanks      wp ------

## 2014-03-02 NOTE — Telephone Encounter (Signed)
Office note sent by fax.  Received confirmation fax.

## 2014-03-10 ENCOUNTER — Telehealth: Payer: Self-pay | Admitting: Neurology

## 2014-03-10 NOTE — Telephone Encounter (Signed)
Called patient and let her know I faxed all the paper work over to the [facility].

## 2014-03-10 NOTE — Telephone Encounter (Signed)
Patient called and stated NCV/EMG reports weren't received at Dr. Crissie Figures were Dr. Leonie Man referred her.  They are trying to do a single fiber nerve conduction study.  rShe didn't want to leave message with medical records, very adamant about nurse giving message to medical records.  Please call and advise.  Thanks

## 2014-03-16 DIAGNOSIS — R071 Chest pain on breathing: Secondary | ICD-10-CM

## 2014-03-16 HISTORY — DX: Chest pain on breathing: R07.1

## 2014-03-22 ENCOUNTER — Telehealth: Payer: Self-pay | Admitting: Internal Medicine

## 2014-03-22 NOTE — Telephone Encounter (Signed)
Patient Information:  Caller Name: Anglia Blakley  Phone: [phone]  Patient: [name]  Gender: Female  DOB: 1960-09-20  Age: 53 Years  PCP: Shanon Ace (Family Practice)  Pregnant: No  Office Follow Up:  Does the office need to follow up with this patient?: No  Instructions For The Office: N/A  RN Note:  Pt. states her case if very complicated and it is better for her to see Dr. Regis Bill. Offered appt. today at 14:00, but declined due to weakness and wants to wait until 03/23/14. Appt. scheduled for 09:15 on 03/23/14 with Dr. Regis Bill.  Symptoms  Reason For Call & Symptoms: Seeing a Neurologist at [facility] for issues. Went to the ED last week with chest pain.Trying to make her a referral to [place]. Thinks she might have Myasthenia Gravis, Amyloidosis or Autonomic Nervous System Disorder. Abnormal EMG of the brain. This morning, her urine is orange in color with Rt. flank pain.  Reviewed Health History In EMR: Yes  Reviewed Medications In EMR: Yes  Reviewed Allergies In EMR: Yes  Reviewed Surgeries / Procedures: Yes  Date of Onset of Symptoms: 03/22/2014 OB / GYN:  LMP: Unknown  Guideline(s) Used:  Flank Pain  Disposition Per Guideline:   Go to ED Now (or to Office with PCP Approval)  Reason For Disposition Reached:   Pain radiates into groin, scrotum  Advice Given:  Call Back If:  You become worse.  Patient Will Follow Care Advice:  YES  Appointment Scheduled:  03/23/2014 09:15:00 Appointment Scheduled Provider:  Shanon Ace [facility])

## 2014-03-23 ENCOUNTER — Ambulatory Visit (INDEPENDENT_AMBULATORY_CARE_PROVIDER_SITE_OTHER): Payer: BC Managed Care – PPO | Admitting: Internal Medicine

## 2014-03-23 ENCOUNTER — Encounter: Payer: Self-pay | Admitting: Internal Medicine

## 2014-03-23 VITALS — BP 130/86 | Temp 97.7°F | Wt 149.0 lb

## 2014-03-23 DIAGNOSIS — R399 Unspecified symptoms and signs involving the genitourinary system: Secondary | ICD-10-CM | POA: Insufficient documentation

## 2014-03-23 DIAGNOSIS — R5381 Other malaise: Secondary | ICD-10-CM

## 2014-03-23 DIAGNOSIS — R3989 Other symptoms and signs involving the genitourinary system: Secondary | ICD-10-CM

## 2014-03-23 DIAGNOSIS — K921 Melena: Secondary | ICD-10-CM | POA: Insufficient documentation

## 2014-03-23 DIAGNOSIS — R195 Other fecal abnormalities: Secondary | ICD-10-CM | POA: Insufficient documentation

## 2014-03-23 DIAGNOSIS — R82998 Other abnormal findings in urine: Secondary | ICD-10-CM

## 2014-03-23 DIAGNOSIS — R5383 Other fatigue: Secondary | ICD-10-CM

## 2014-03-23 DIAGNOSIS — R300 Dysuria: Secondary | ICD-10-CM

## 2014-03-23 LAB — POCT URINALYSIS DIPSTICK
Bilirubin, UA: NEGATIVE
Blood, UA: NEGATIVE
GLUCOSE UA: NEGATIVE
Ketones, UA: NEGATIVE
Leukocytes, UA: NEGATIVE
NITRITE UA: NEGATIVE
PROTEIN UA: NEGATIVE
Spec Grav, UA: 1.025
UROBILINOGEN UA: 0.2
pH, UA: 5

## 2014-03-23 NOTE — Progress Notes (Signed)
Pre visit review using our clinic review tool, if applicable. No additional management support is needed unless otherwise documented below in the visit note.  Chief Complaint  Patient presents with  . Dysuria    HPI: Patient comes in today for SDA for  new problem evaluation. See CAN note: felt too week to come in yesterday . IShe has pictures of urine that was discolored when she called the on-call service. Looks almost PT color pink. Yellow. No fever some flank pain not new   She also shows a stool recently without associated pain that was read flecks mixed in dark to bright red no history of same.  recent  past has been seen at [facility] and most recently [place] for neurologic evaluation Had ed eval for chest pain not acardiac  On 7 30 Patient under evaluation for autonomic changes think she could have amyloid. States she had an abnormal EMG and some proteins but negative Bence-Jones. Feels weak a lot. Nonspecific. Sees her counselor weekly to see her psychiatrist in November.She has also had abnormal lfts  hasnt fu with gi yet  ROS: See pertinent positives and negatives per HPI. Had episode of chest pain when TED negative ischemia workup blood pressure was up and down  Past Medical History  Diagnosis Date  . Headache(784.0)   . IBS (irritable bowel syndrome)   . Bipolar 1 disorder     HX PSYCHOSIS  . Personality disorder     W/ BORDERLINE FEATURES  . History of duodenal ulcer   . Pelvic pain   . Complication of anesthesia     POST AGGITATION  . Frequency of urination   . Urgency of urination   . Nocturia     Family History  Problem Relation Age of Onset  . Sleep apnea Father   . Migraines Sister     headaches  . Other Mother     MAC infection    History   Social History  . Marital Status: Single    Spouse Name: N/A    Number of Children: 0  . Years of Education: college   Occupational History  . N/A     Nurse   Social History Main Topics  .  Smoking status: Former Smoker -- 0.01 packs/day for 1 years    Types: Cigarettes    Quit date: 08/18/2009  . Smokeless tobacco: Never Used     Comment: ONLY SMOKED FOR 6 MONTHS --  QUIT  YRS AGO  . Alcohol Use: 1.8 oz/week    3 Cans of beer per week     Comment: socially   . Drug Use: No  . Sexual Activity: None   Other Topics Concern  . 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    no tobacco   Caffeine Use: very little, three times a week    Outpatient Encounter Prescriptions as of 03/23/2014  Medication Sig  . zolpidem (AMBIEN) 5 MG tablet Take 5 mg by mouth at bedtime.   . [DISCONTINUED] sulfamethoxazole-trimethoprim (BACTRIM DS) 800-160 MG per tablet Take 1 tablet by mouth 2 (two) times daily.    EXAM:  BP 130/86  Temp(Src) 97.7 F (36.5 C) (Oral)  Wt 149 lb (67.586 kg)  Body mass index is 29.1 kg/(m^2).  GENERAL: vitals reviewed and listed above, alert, oriented,  appears well hydrated and in no acute distress looks tired ambulatory has a bottle of red Gatorade HEENT: atraumatic, conjunctiva  clear, no obvious abnormalities on inspection of external nose and ears  MS: moves all extremities without noticeable focal  abnormality PSYCH: pleasant and cooperative, n speech is normal looks a bit worried  ua negative skin normal colored reviewed  Cell phon epix.  ASSESSMENT AND PLAN:  Discussed the following assessment and plan:  Lower urinary tract symptoms (LUTS) - sound more like bladder dysfunction not uti  Dysuria - Plan: POC Urinalysis Dipstick  Abnormal urine color - picture only looks like coloratino not blood (uncertain )  Stool color abnormal  Blood in stool ? - probable  but not confirmed .  has had red gatorade but nothin else reported   Other malaise and fatigue - underevaluation  -Patient advised  to return or notify health care team  if symptoms worsen ,persist or new concerns arise.  Patient Instructions  No evidence of UTI on your urinalysis. Uncertain if the discoloration could have been something red pink colored you ate versus blood because of the color.  Because of the stool color change I can blood versus other do stool cards and bring them in so we can check for occult blood. Want you to  see gastroenterology Dr. Collene Mares because of the abnormal liver tests and the abnormal stool color concerning for blood. Keep [place] informed about the neurology workup.   [name]. Panosh M.D.

## 2014-03-23 NOTE — Patient Instructions (Signed)
No evidence of UTI on your urinalysis. Uncertain if the discoloration could have been something red pink colored you ate versus blood because of the color.  Because of the stool color change I can blood versus other do stool cards and bring them in so we can check for occult blood. Want you to  see gastroenterology Dr. Collene Mares because of the abnormal liver tests and the abnormal stool color concerning for blood. Keep [place] informed about the neurology workup.

## 2014-03-23 NOTE — Telephone Encounter (Signed)
Noted  

## 2014-03-28 ENCOUNTER — Telehealth: Payer: Self-pay | Admitting: Internal Medicine

## 2014-03-28 NOTE — Telephone Encounter (Signed)
Pt states she is still urinating large volumes of fluid, she said it isn't discolored, 12 1/2 liters of urine over the weekend, and she has a right kidney stone. Pt would like a referral to see a nephrologist.

## 2014-03-28 NOTE — Telephone Encounter (Signed)
Patient Information:  Caller Name: Joice  Phone: [phone]  Patient: [name]  Gender: Female  DOB: 1961-03-15  Age: 53 Years  PCP: Shanon Ace (Family Practice)  Pregnant: No  Office Follow Up:  Does the office need to follow up with this patient?: Yes  Instructions For The Office: Please call pt back ASAP.  She is requesting a nephrology referral.  RN Note:  Please call pt back.  She is requesting a nephrology referral due to the polyuria and weakness.  Declined offered appointment and stated that she may go to the ED.  Symptoms  Reason For Call & Symptoms: Onset 03/24/2014 of polyuria, approximately 4 liters daily.  Also c/o weakness and burning sensation to bilateral back (diagnosed with kidney stone 2 months ago).  Reviewed Health History In EMR: Yes  Reviewed Medications In EMR: Yes  Reviewed Allergies In EMR: Yes  Reviewed Surgeries / Procedures: Yes  Date of Onset of Symptoms: 03/24/2014 OB / GYN:  LMP: Unknown  Guideline(s) Used:  Urination Pain - Female  Disposition Per Guideline:   See Today in Office  Reason For Disposition Reached:   > 2 UTIs in last year  Advice Given:  Call Back If:   Fever lasts more than 24 hours on antibiotics  Pain does not improve by day 3 on antibiotics  Urine symptoms do not improve by day 3 on antibiotics  You become worse.  Patient Refused Recommendation:  Patient Will Go To ED  Pt declined offered appointment and may go to the ED.

## 2014-03-28 NOTE — Telephone Encounter (Signed)
I see from her chart that she had a normal BMET in June indicating normal kidney function. I do not think she needs to see a nephrologist. If she is concerned about the kidney stone, then a urologist would be the specialist to see. Since she has no other symptoms right now, I would rather she ask Dr. Regis Bill for this referral when she gets back next week.

## 2014-03-31 NOTE — Telephone Encounter (Signed)
i agree with dr Sarajane Jews statement.   She can bring in another UA in the interim.   She can monitor  Her I and O in the meantime .  If we refer to urology would prefer go to [facility] because they are evaluating her for the neurologic concerns that she has.

## 2014-03-31 NOTE — Telephone Encounter (Signed)
Patient seen at [facility] and had urinalysis there.  Pt wants Dr. Regis Bill to know that her heart rate was 127 and her BP was 190/90 when in [facility] ED.  She has upcoming appt at [facility].  Will call back if we can assist.

## 2014-04-12 ENCOUNTER — Encounter (HOSPITAL_COMMUNITY): Payer: Self-pay | Admitting: *Deleted

## 2014-04-12 ENCOUNTER — Encounter (HOSPITAL_COMMUNITY): Payer: Self-pay | Admitting: Emergency Medicine

## 2014-04-12 ENCOUNTER — Emergency Department (HOSPITAL_COMMUNITY)
Admission: EM | Admit: 2014-04-12 | Discharge: 2014-04-12 | Disposition: A | Discharge status: Other Acute Inpatient Hospital | Payer: BC Managed Care – PPO | Attending: Emergency Medicine | Admitting: Emergency Medicine

## 2014-04-12 ENCOUNTER — Inpatient Hospital Stay (HOSPITAL_COMMUNITY)
Admission: AD | Admit: 2014-04-12 | Discharge: 2014-04-24 | DRG: 885 | Disposition: A | Discharge status: Home / Self Care | Payer: BC Managed Care – PPO | Source: Intra-hospital | Attending: Emergency Medicine | Admitting: Emergency Medicine

## 2014-04-12 DIAGNOSIS — E669 Obesity, unspecified: Secondary | ICD-10-CM | POA: Insufficient documentation

## 2014-04-12 DIAGNOSIS — G4700 Insomnia, unspecified: Secondary | ICD-10-CM | POA: Diagnosis present

## 2014-04-12 DIAGNOSIS — Z91199 Patient's noncompliance with other medical treatment and regimen due to unspecified reason: Secondary | ICD-10-CM | POA: Diagnosis not present

## 2014-04-12 DIAGNOSIS — F319 Bipolar disorder, unspecified: Secondary | ICD-10-CM | POA: Diagnosis present

## 2014-04-12 DIAGNOSIS — F609 Personality disorder, unspecified: Secondary | ICD-10-CM | POA: Diagnosis present

## 2014-04-12 DIAGNOSIS — R32 Unspecified urinary incontinence: Secondary | ICD-10-CM | POA: Diagnosis present

## 2014-04-12 DIAGNOSIS — R45851 Suicidal ideations: Secondary | ICD-10-CM | POA: Diagnosis not present

## 2014-04-12 DIAGNOSIS — F28 Other psychotic disorder not due to a substance or known physiological condition: Secondary | ICD-10-CM

## 2014-04-12 DIAGNOSIS — R4182 Altered mental status, unspecified: Secondary | ICD-10-CM | POA: Insufficient documentation

## 2014-04-12 DIAGNOSIS — K5900 Constipation, unspecified: Secondary | ICD-10-CM | POA: Diagnosis present

## 2014-04-12 DIAGNOSIS — F29 Unspecified psychosis not due to a substance or known physiological condition: Secondary | ICD-10-CM | POA: Diagnosis not present

## 2014-04-12 DIAGNOSIS — R739 Hyperglycemia, unspecified: Secondary | ICD-10-CM

## 2014-04-12 DIAGNOSIS — R7309 Other abnormal glucose: Secondary | ICD-10-CM | POA: Diagnosis present

## 2014-04-12 DIAGNOSIS — F411 Generalized anxiety disorder: Secondary | ICD-10-CM | POA: Diagnosis present

## 2014-04-12 DIAGNOSIS — R339 Retention of urine, unspecified: Secondary | ICD-10-CM | POA: Diagnosis present

## 2014-04-12 DIAGNOSIS — F22 Delusional disorders: Secondary | ICD-10-CM

## 2014-04-12 DIAGNOSIS — Z9119 Patient's noncompliance with other medical treatment and regimen: Secondary | ICD-10-CM | POA: Diagnosis not present

## 2014-04-12 DIAGNOSIS — Z87891 Personal history of nicotine dependence: Secondary | ICD-10-CM

## 2014-04-12 DIAGNOSIS — Z609 Problem related to social environment, unspecified: Secondary | ICD-10-CM

## 2014-04-12 DIAGNOSIS — Z8719 Personal history of other diseases of the digestive system: Secondary | ICD-10-CM | POA: Insufficient documentation

## 2014-04-12 DIAGNOSIS — F259 Schizoaffective disorder, unspecified: Principal | ICD-10-CM | POA: Diagnosis present

## 2014-04-12 DIAGNOSIS — K589 Irritable bowel syndrome without diarrhea: Secondary | ICD-10-CM | POA: Diagnosis present

## 2014-04-12 DIAGNOSIS — R350 Frequency of micturition: Secondary | ICD-10-CM

## 2014-04-12 DIAGNOSIS — E663 Overweight: Secondary | ICD-10-CM | POA: Diagnosis present

## 2014-04-12 DIAGNOSIS — F250 Schizoaffective disorder, bipolar type: Secondary | ICD-10-CM

## 2014-04-12 DIAGNOSIS — F451 Undifferentiated somatoform disorder: Secondary | ICD-10-CM

## 2014-04-12 LAB — CBC
HCT: 40.5 % (ref 36.0–46.0)
Hemoglobin: 13.7 g/dL (ref 12.0–15.0)
MCH: 29.6 pg (ref 26.0–34.0)
MCHC: 33.8 g/dL (ref 30.0–36.0)
MCV: 87.5 fL (ref 78.0–100.0)
PLATELETS: 342 10*3/uL (ref 150–400)
RBC: 4.63 MIL/uL (ref 3.87–5.11)
RDW: 12.7 % (ref 11.5–15.5)
WBC: 13 10*3/uL — AB (ref 4.0–10.5)

## 2014-04-12 LAB — SALICYLATE LEVEL: Salicylate Lvl: <2 mg/dL — ABNORMAL LOW (ref 2.8–20.0)

## 2014-04-12 LAB — URINALYSIS, ROUTINE W REFLEX MICROSCOPIC
Bilirubin Urine: NEGATIVE
Glucose, UA: NEGATIVE mg/dL
Hgb urine dipstick: NEGATIVE
Ketones, ur: NEGATIVE mg/dL
LEUKOCYTES UA: NEGATIVE
Nitrite: NEGATIVE
Protein, ur: NEGATIVE mg/dL
Specific Gravity, Urine: 1.01 (ref 1.005–1.030)
UROBILINOGEN UA: 0.2 mg/dL (ref 0.0–1.0)
pH: 8 (ref 5.0–8.0)

## 2014-04-12 LAB — COMPREHENSIVE METABOLIC PANEL
ALBUMIN: 4.3 g/dL (ref 3.5–5.2)
ALT: 93 U/L — AB (ref 0–35)
AST: 75 U/L — AB (ref 0–37)
Alkaline Phosphatase: 117 U/L (ref 39–117)
Anion gap: 17 — ABNORMAL HIGH (ref 5–15)
BUN: 14 mg/dL (ref 6–23)
CO2: 23 meq/L (ref 19–32)
CREATININE: 0.71 mg/dL (ref 0.50–1.10)
Calcium: 10.6 mg/dL — ABNORMAL HIGH (ref 8.4–10.5)
Chloride: 103 mEq/L (ref 96–112)
GFR calc Af Amer: >90 mL/min (ref >90)
Glucose, Bld: 153 mg/dL — ABNORMAL HIGH (ref 70–99)
Potassium: 4.1 mEq/L (ref 3.7–5.3)
SODIUM: 143 meq/L (ref 137–147)
Total Bilirubin: 1.4 mg/dL — ABNORMAL HIGH (ref 0.3–1.2)
Total Protein: 8 g/dL (ref 6.0–8.3)

## 2014-04-12 LAB — RAPID URINE DRUG SCREEN, HOSP PERFORMED
Amphetamines: NOT DETECTED
BARBITURATES: NOT DETECTED
Benzodiazepines: NOT DETECTED
COCAINE: NOT DETECTED
Opiates: NOT DETECTED
TETRAHYDROCANNABINOL: NOT DETECTED

## 2014-04-12 LAB — ETHANOL: Alcohol, Ethyl (B): <11 mg/dL (ref 0–11)

## 2014-04-12 LAB — LITHIUM LEVEL

## 2014-04-12 LAB — ACETAMINOPHEN LEVEL

## 2014-04-12 MED ORDER — VENLAFAXINE HCL ER 75 MG PO CP24
75.0000 mg | ORAL_CAPSULE | Freq: Every day | ORAL | Status: DC
  Filled 2014-04-12: qty 1

## 2014-04-12 MED ORDER — ACETAMINOPHEN 325 MG PO TABS: 650.0000 mg | ORAL_TABLET | ORAL | Status: DC | PRN

## 2014-04-12 MED ORDER — MAGNESIUM HYDROXIDE 400 MG/5ML PO SUSP
30.0000 mL | Freq: Every day | ORAL | Status: DC | PRN
  Administered 2014-04-17: 30 mL via ORAL

## 2014-04-12 MED ORDER — ZOLPIDEM TARTRATE 5 MG PO TABS
5.0000 mg | ORAL_TABLET | Freq: Every evening | ORAL | Status: DC | PRN
  Administered 2014-04-12: 5 mg via ORAL
  Filled 2014-04-12: qty 1

## 2014-04-12 MED ORDER — ALUM & MAG HYDROXIDE-SIMETH 200-200-20 MG/5ML PO SUSP: 30.0000 mL | ORAL | Status: DC | PRN

## 2014-04-12 MED ORDER — LITHIUM CARBONATE 300 MG PO CAPS: 300.0000 mg | ORAL_CAPSULE | Freq: Every day | ORAL | Status: DC

## 2014-04-12 MED ORDER — IBUPROFEN 200 MG PO TABS: 600.0000 mg | ORAL_TABLET | Freq: Three times a day (TID) | ORAL | Status: DC | PRN

## 2014-04-12 MED ORDER — LORAZEPAM 1 MG PO TABS
1.0000 mg | ORAL_TABLET | Freq: Once | ORAL | Status: AC
  Administered 2014-04-12: 1 mg via ORAL
  Filled 2014-04-12: qty 1

## 2014-04-12 MED ORDER — ACETAMINOPHEN 325 MG PO TABS: 650.0000 mg | ORAL_TABLET | Freq: Four times a day (QID) | ORAL | Status: DC | PRN

## 2014-04-12 MED ORDER — TRAZODONE HCL 50 MG PO TABS
50.0000 mg | ORAL_TABLET | Freq: Every evening | ORAL | Status: DC | PRN
  Administered 2014-04-12 – 2014-04-14 (×4): 50 mg via ORAL
  Filled 2014-04-12 (×7): qty 1

## 2014-04-12 MED ORDER — LITHIUM CARBONATE 300 MG PO CAPS
300.0000 mg | ORAL_CAPSULE | Freq: Every day | ORAL | Status: DC
  Administered 2014-04-12 – 2014-04-21 (×7): 300 mg via ORAL
  Filled 2014-04-12: qty 3
  Filled 2014-04-12 (×12): qty 1

## 2014-04-12 MED ORDER — ONDANSETRON HCL 4 MG PO TABS: 4.0000 mg | ORAL_TABLET | Freq: Three times a day (TID) | ORAL | Status: DC | PRN

## 2014-04-12 MED ORDER — ZOLPIDEM TARTRATE 5 MG PO TABS
5.0000 mg | ORAL_TABLET | Freq: Every day | ORAL | Status: DC
  Administered 2014-04-12: 5 mg via ORAL

## 2014-04-12 MED ORDER — VENLAFAXINE HCL ER 75 MG PO CP24
75.0000 mg | ORAL_CAPSULE | Freq: Every day | ORAL | Status: DC
  Filled 2014-04-12 (×2): qty 1

## 2014-04-12 MED ORDER — ZOLPIDEM TARTRATE 10 MG PO TABS: 10.0000 mg | ORAL_TABLET | Freq: Every day | ORAL | Status: DC

## 2014-04-12 MED ORDER — RISPERIDONE 1 MG PO TBDP
1.0000 mg | ORAL_TABLET | Freq: Every day | ORAL | Status: DC
  Administered 2014-04-12: 1 mg via ORAL
  Filled 2014-04-12 (×3): qty 1

## 2014-04-12 MED ORDER — IBUPROFEN 200 MG PO TABS
600.0000 mg | ORAL_TABLET | Freq: Three times a day (TID) | ORAL | Status: DC | PRN
  Administered 2014-04-19: 400 mg via ORAL
  Administered 2014-04-20: 600 mg via ORAL
  Filled 2014-04-12 (×2): qty 3
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
## 2014-04-12 NOTE — Consult Note (Signed)
  Ms Boye has been accepted at [facility] bed 407-1 for treatment of her psychosis

## 2014-04-12 NOTE — Consult Note (Signed)
Face to face evaluation and I agree with this note 
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
## 2014-04-12 NOTE — ED Notes (Signed)
Pt moved from room 15 to 28. Her personal belongings (one brown pocket book with wallet and earrings inside) were placed into locker 28 and were wanded by security as well as the pt was wanded.
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
## 2014-04-12 NOTE — ED Notes (Signed)
Pt awake states she does not feel good  Pt states she feels very bad and is afraid something is going to happen to her  Requesting sleep medication  Explained it was morning and we could not administer sleep medication at this hour  Pt very anxious and becoming agitated  EDP made aware

## 2014-04-12 NOTE — Tx Team (Signed)
Initial Interdisciplinary Treatment Plan   PATIENT STRESSORS: Medication change or noncompliance   PROBLEM LIST: Problem List/Patient Goals Date to be addressed Date deferred Reason deferred Estimated date of resolution  paranoia 04/12/2014                                                      DISCHARGE CRITERIA:  Improved stabilization in mood, thinking, and/or behavior Need for constant or close observation no longer present  PRELIMINARY DISCHARGE PLAN: Attend aftercare/continuing care group Outpatient therapy  PATIENT/FAMIILY INVOLVEMENT: This treatment plan has been presented to and reviewed with the patient, [name].  The patient and family have been given the opportunity to ask questions and make suggestions.  Marilynne Halsted M 04/12/2014, 5:31 PM

## 2014-04-12 NOTE — Progress Notes (Signed)
Adult Psychoeducational Group Note  Date:  04/12/2014 Time:  10:23 PM  Group Topic/Focus:  Wrap-Up Group:   The focus of this group is to help patients review their daily goal of treatment and discuss progress on daily workbooks.  Participation Level:  Minimal  Participation Quality:  Appropriate  Affect:  Depressed  Cognitive:  Lacking  Insight: Lacking  Engagement in Group:  Limited  Modes of Intervention:  Socialization and Support  Additional Comments:  Patient attended and participated in group tonight. She reports that she thinks someone wants to kill her. She want to talk to someone for 10 years straight. This Probation officer suggested that she speak with her nurse.  Salley Scarlet [facility] 04/12/2014, 10:23 PM

## 2014-04-12 NOTE — BH Assessment (Signed)
Patient accepted to [facility] by Dr. Kirkland Hun, NP. The attending provider is Dr. Darleene Cleaver. The room assignment is 407-1. Nursing report # is [phone].

## 2014-04-12 NOTE — Progress Notes (Signed)
D: Patient denies SI/HI and A/V hallucinations at this time; but was guarded about some information and that she could not tell this information; patient could be tangential and had to be redirected at times

## 2014-04-13 ENCOUNTER — Encounter (HOSPITAL_COMMUNITY): Payer: Self-pay | Admitting: Psychiatry

## 2014-04-13 DIAGNOSIS — F319 Bipolar disorder, unspecified: Secondary | ICD-10-CM

## 2014-04-13 MED ORDER — HALOPERIDOL 5 MG PO TABS
5.0000 mg | ORAL_TABLET | Freq: Once | ORAL | Status: AC
  Administered 2014-04-13: 5 mg via ORAL
  Filled 2014-04-13 (×2): qty 1

## 2014-04-13 MED ORDER — GABAPENTIN 100 MG PO CAPS
200.0000 mg | ORAL_CAPSULE | Freq: Three times a day (TID) | ORAL | Status: DC
  Administered 2014-04-13 – 2014-04-14 (×3): 200 mg via ORAL
  Filled 2014-04-13 (×7): qty 2

## 2014-04-13 MED ORDER — VENLAFAXINE HCL ER 37.5 MG PO CP24
37.5000 mg | ORAL_CAPSULE | Freq: Every day | ORAL | Status: DC
  Administered 2014-04-14: 37.5 mg via ORAL
  Filled 2014-04-13 (×2): qty 1

## 2014-04-13 MED ORDER — ARIPIPRAZOLE 5 MG PO TABS
5.0000 mg | ORAL_TABLET | Freq: Every day | ORAL | Status: DC
  Administered 2014-04-13: 5 mg via ORAL
  Filled 2014-04-13 (×2): qty 1

## 2014-04-13 MED ORDER — LORAZEPAM 1 MG PO TABS
2.0000 mg | ORAL_TABLET | Freq: Once | ORAL | Status: AC
  Administered 2014-04-13: 2 mg via ORAL
  Filled 2014-04-13: qty 2
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
## 2014-04-13 NOTE — Progress Notes (Signed)
   Patient in bed sleeping. Respiration regular and unlabored. No sign of distress noted at this time
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
## 2014-04-13 NOTE — BHH Group Notes (Signed)
[facility] Group Notes:  Goals group and kicking bad habits  Date:  04/13/2014  Time:  11:00 AM  Type of Therapy:  Nurse Education  Participation Level:  Did Not Attend  Participation Quality:  Inattentive  Affect:  Flat  Cognitive:  Lacking  Insight:  None  Engagement in Group:  None  Modes of Intervention:  Discussion  Summary of Progress/Problems: Pt did not attend [name] 04/13/2014, 11:00 AM

## 2014-04-13 NOTE — Progress Notes (Addendum)
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 Pt appears dishelved and wants to remain in bed to sleep.She let then nurse know she used to work here as a Marine scientist.pt is pleasant but remains paranoid. She stated she would only take her pill this am if it was brown. Once given the pill she inspected it carefully before she would take the medicine. Pt denies Si and HI and contracts for safety.5:30pm -Pt was made a fall risk. 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 Pt wasnted to be assured that the MD was made aware of this. Pt was given yellow socks . She was encouraged to drink water and all meals have been brought back for the pt.
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
## 2014-04-13 NOTE — Progress Notes (Signed)
Pt up all evening stating she is afraid to sleep. Pt asked to be taken to ER because she has UTI. Pt given a cup to give urine sample. Pt stated is  unable to urinate. Pt c/o chest pain rated as 8 on a scale of 0-10. VS b/p 123/90 p 122 o2 sat 98. PA (spencer) notified of pt condition. haldol and ativan administered as prescribed @ 0318. Pt up pacing hallway. Pt redirected back to bed. Will continue to monitor pt.

## 2014-04-13 NOTE — BHH Group Notes (Signed)
[facility] Group Notes:  (Counselor/Nursing/MHT/Case Management/Adjunct)  04/13/2014 1:15PM  Type of Therapy:  Group Therapy  Participation Level:  Active  Participation Quality:  Appropriate  Affect:  Flat  Cognitive:  Oriented  Insight:  Improving  Engagement in Group:  Limited  Engagement in Therapy:  Limited  Modes of Intervention:  Discussion, Exploration and Socialization  Summary of Progress/Problems: The topic for group was balance in life.  Pt participated in the discussion about when their life was in balance and out of balance and how this feels.  Pt discussed ways to get back in balance and short term goals they can work on to get where they want to be.  Did not attend   Trish Mage 04/13/2014 4:56 PM

## 2014-04-13 NOTE — BHH Suicide Risk Assessment (Signed)
   Nursing information obtained from:  Patient Demographic factors:  Divorced or widowed;Caucasian;Low socioeconomic status;Unemployed Current Mental Status:  NA Loss Factors:  NA Historical Factors:  NA Risk Reduction Factors:  NA Total Time spent with patient: 20 minutes  CLINICAL FACTORS:   Bipolar Disorder:   Mixed State  Psychiatric Specialty Exam: Physical Exam  Constitutional: She is oriented to person, place, and time. She appears well-developed and well-nourished.  HENT:  Head: Normocephalic and atraumatic.  Eyes: Pupils are equal, round, and reactive to light.  Neck: Normal range of motion.  Cardiovascular: Normal rate.   GI: Soft.  Musculoskeletal: Normal range of motion.  Neurological: She is alert and oriented to person, place, and time.  Skin: Skin is warm.  Psychiatric: Her speech is normal. Her mood appears anxious. Her affect is labile. She is slowed and withdrawn. Thought content is delusional. Cognition and memory are normal. She expresses impulsivity. She exhibits a depressed mood. She expresses suicidal ideation.    Review of Systems  Constitutional: Positive for malaise/fatigue.  HENT: Negative.   Eyes: Negative.   Respiratory: Negative.   Cardiovascular: Negative.   Genitourinary: Negative.   Musculoskeletal: Negative.   Skin: Negative.   Neurological: Negative.   Psychiatric/Behavioral: Positive for depression, suicidal ideas, hallucinations (patient has premonition and feels she can read minds) and substance abuse. The patient is nervous/anxious and has insomnia.     Blood pressure 132/76, pulse 114, temperature 98.5 F (36.9 C), temperature source Oral, resp. rate 16, height 4\' 10"  (1.473 m), weight 64.411 kg (142 lb), SpO2 98.00%.Body mass index is 29.69 kg/(m^2).  General Appearance: Disheveled  Eye Contact::  Good  Speech:  Slow  Volume:  Decreased  Mood:  Anxious, Depressed and Dysphoric  Affect:  Restricted  Thought Process:  Disorganized,  Irrelevant and Loose  Orientation:  Full (Time, Place, and Person)  Thought Content:  Delusions and Ideas of Reference:   Paranoia Delusions  Suicidal Thoughts:  Yes.  without intent/plan  Homicidal Thoughts:  No  Memory:  Negative  Judgement:  Impaired  Insight:  Lacking  Psychomotor Activity:  Decreased  Concentration:  Fair  Recall:  Poor  Fund of Knowledge:Poor  Language: Fair  Akathisia:  No    AIMS (if indicated):   0  Assets:  Communication Skills Desire for Improvement  Sleep:      Musculoskeletal: Strength & Muscle Tone: within normal limits Gait & Station: normal Patient leans: N/A  COGNITIVE FEATURES THAT CONTRIBUTE TO RISK:  Closed-mindedness Thought constriction (tunnel vision)    SUICIDE RISK:   Moderate:  Frequent suicidal ideation with limited intensity, and duration, some specificity in terms of plans, no associated intent, good self-control, limited dysphoria/symptomatology, some risk factors present, and identifiable protective factors, including available and accessible social support.  PLAN OF CARE:  I certify that inpatient services furnished can reasonably be expected to improve the patient's condition.  [name] 04/13/2014, 2:18 PM

## 2014-04-14 MED ORDER — GABAPENTIN 100 MG PO CAPS
200.0000 mg | ORAL_CAPSULE | Freq: Two times a day (BID) | ORAL | Status: DC
  Administered 2014-04-15 – 2014-04-24 (×17): 200 mg via ORAL
  Filled 2014-04-14 (×2): qty 2
  Filled 2014-04-14: qty 1
  Filled 2014-04-14: qty 12
  Filled 2014-04-14 (×2): qty 2
  Filled 2014-04-14: qty 12
  Filled 2014-04-14 (×9): qty 2
  Filled 2014-04-14: qty 16
  Filled 2014-04-14 (×2): qty 12
  Filled 2014-04-14: qty 16
  Filled 2014-04-14 (×2): qty 2
  Filled 2014-04-14: qty 12
  Filled 2014-04-14 (×3): qty 2
  Filled 2014-04-14: qty 12
  Filled 2014-04-14 (×2): qty 2

## 2014-04-14 MED ORDER — ZOLPIDEM TARTRATE 5 MG PO TABS
5.0000 mg | ORAL_TABLET | Freq: Every day | ORAL | Status: DC
  Administered 2014-04-14 – 2014-04-17 (×3): 5 mg via ORAL
  Filled 2014-04-14 (×3): qty 1

## 2014-04-14 MED ORDER — ARIPIPRAZOLE 10 MG PO TABS
10.0000 mg | ORAL_TABLET | Freq: Every day | ORAL | Status: DC
  Filled 2014-04-14 (×4): qty 1
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
## 2014-04-14 NOTE — Progress Notes (Signed)
[facility] MD Progress Note  04/14/2014 12:34 PM [name]  MRN:  [id number] Subjective: " I am not able to sleep"  Objective : Patient seen and chart reviewed. Patient seen in bed,reports she is tired since she did not sleep well last night. Reports that the only medication that works for her is the Ambien for sleep. Patient continues to be disorganized and delusional. She reports paranoia as well as ideas of reference but is improving. Patient is withdrawn and in bed most of the time.  Per staff patient has been noncompliant with some of her medications.  Diagnosis:   DSM5: Primary psychiatric diagnosis:  Schizoaffective disorder,bipolar type,multiple episodes ,currently in acute episode   Secondary psychiatric diagnosis:  R/O Somatic symptom disorder,persistent,moderate   Non psychiatric diagnosis:  Hx of generalized weakness (all work up wnl)  IBS  Hx of duodenal ulcer  Hx of frequency of urination      ADL's:  Intact  Sleep: Poor  Appetite:  Poor  Suicidal Ideation:  denies Homicidal Ideation:  denies AEB (as evidenced by):  Psychiatric Specialty Exam: Physical Exam  Constitutional: She appears well-developed and well-nourished.    Review of Systems  Constitutional: Negative.   HENT: Negative.   Eyes: Negative.   Respiratory: Negative.   Cardiovascular: Negative.   Genitourinary: Negative.   Musculoskeletal: Negative.   Skin: Negative.   Psychiatric/Behavioral: Positive for hallucinations. The patient has insomnia.     Blood pressure 124/74, pulse 98, temperature 97.3 F (36.3 C), temperature source Oral, resp. rate 16, height 4\' 10"  (1.473 m), weight 64.411 kg (142 lb), SpO2 98.00%.Body mass index is 29.69 kg/(m^2).  General Appearance: Casual  Eye Contact::  Fair  Speech:  Normal Rate  Volume:  Decreased  Mood:  Anxious and Dysphoric  Affect:  Labile  Thought Process:  Disorganized, Irrelevant, Loose and Tangential  Orientation:  Full (Time, Place,  and Person)  Thought Content:  Delusions and Ideas of Reference:   Paranoia Delusions  Suicidal Thoughts:  No  Homicidal Thoughts:  No  Memory:  Negative  Judgement:  Impaired  Insight:  Lacking  Psychomotor Activity:  Decreased  Concentration:  Fair  Recall:  [place] of Knowledge:Good  Language: Fair  Akathisia:  No    AIMS (if indicated):   0  Assets:  Communication Skills  Sleep:  Number of Hours: 4.25   Musculoskeletal: Strength & Muscle Tone: within normal limits Gait & Station: normal Patient leans: N/A  Current Medications: Current Facility-Administered Medications  Medication Dose Route Frequency Provider Last Rate Last Dose  . acetaminophen (TYLENOL) tablet 650 mg  650 mg Oral Q6H PRN Clarene Reamer, MD      . alum & mag hydroxide-simeth (MAALOX/MYLANTA) 200-200-20 MG/5ML suspension 30 mL  30 mL Oral Q4H PRN Clarene Reamer, MD      . ARIPiprazole (ABILIFY) tablet 10 mg  10 mg Oral QHS Eyoel Throgmorton, MD      . gabapentin (NEURONTIN) capsule 200 mg  200 mg Oral TID Ursula Alert, MD   200 mg at 04/14/14 0808  . ibuprofen (ADVIL,MOTRIN) tablet 600 mg  600 mg Oral Q8H PRN Clarene Reamer, MD      . lithium carbonate capsule 300 mg  300 mg Oral QHS Clarene Reamer, MD   300 mg at 04/13/14 2121  . magnesium hydroxide (MILK OF MAGNESIA) suspension 30 mL  30 mL Oral Daily PRN Clarene Reamer, MD      . ondansetron [facility]) tablet  4 mg  4 mg Oral Q8H PRN Clarene Reamer, MD      . zolpidem (AMBIEN) tablet 5 mg  5 mg Oral QHS Ursula Alert, MD        Lab Results: No results found for this or any previous visit (from the past 48 hour(s)).  Physical Findings: AIMS:  , ,  ,  ,    CIWA:    COWS:     Treatment Plan Summary: Daily contact with patient to assess and evaluate symptoms and progress in treatment Medication management  Plan:  Will increase Abilify to 10 mg po qhs for psychosis Will change gabapentin to BID dosing since patient does not want to be in  bed all day. Will continue Lithium at 300 mg ,will not make any changes with that since she has prior side effects when it was increased. Will add Ambien for sleep. CSW will work on disposition. Patient to participate in therapeutic milieu.       Medical Decision Making Problem Points:  Established problem, stable/improving (1), Review of last therapy session (1) and Review of psycho-social stressors (1) Data Points:  Order [place] Assessment (2) Review or order medicine tests (1) Review and summation of old records (2) Review of medication regiment & side effects (2)  I certify that inpatient services furnished can reasonably be expected to improve the patient's condition.   [name] 04/14/2014, 12:34 PM

## 2014-04-14 NOTE — BHH Group Notes (Signed)
[facility] LCSW Group Therapy  04/14/2014 1:53 PM  Type of Therapy:  Group Therapy  Participation Level:  Did Not Attend-pt encouraged to attend group. She was in bed resting. Stated that she did not want to attend group.    Smart, Dhamar Gregory LCSWA 04/14/2014, 1:53 PM

## 2014-04-14 NOTE — Progress Notes (Signed)
Patient ID: [name], female   DOB: [birth year], 52 y.o.   MRN: [id number]  D: Patient smiling on approach. Currently denies any problems with her mood or any SI. Reports that she is not having any SI this am. Talking about not getting much sleep during the night and asking for Ambien. Came up later asking if she could have some Ambien during the day to relax her. Patient smiling very large in a bizarre way. A: Staff will monitor on q 15 minute checks, follow treatment plan, and give medications as ordered. R: Cooperative on the unit.

## 2014-04-14 NOTE — B[facility] Group Notes (Signed)
[facility] LCSW Aftercare Discharge Planning Group Note   04/14/2014 11:20 AM  Participation Quality:  Invited, chose to not attend.  "I feel weak and I need ambien."    [name], [name]

## 2014-04-14 NOTE — BHH Group Notes (Signed)
Adult Psychoeducational Group Note  Date:  04/14/2014 Time:  9:10 PM  Group Topic/Focus:  Wrap-Up Group:   The focus of this group is to help patients review their daily goal of treatment and discuss progress on daily workbooks.  Participation Level:  Did Not Attend  Participation Quality:  None  Affect:  None  Cognitive:  None  Insight: None  Engagement in Group:  None  Modes of Intervention:  Discussion  Additional Comments:  Latoia did not attend group.  Victorino Sparrow A 04/14/2014, 9:10 PM

## 2014-04-14 NOTE — Progress Notes (Signed)
Patient ID: [name], female   DOB: [birth year], 53 y.o.   MRN: [id number] D: Patient sitting in dayroom eating after waking up around 2100. Pt made no reference /complaint about her jaw or muscle wasting to Probation officer. Pt much calmer and not as disorganized as she was yesterday. Pt denies SI/HI/AVH and pain. Coperative with assessment.   A: Met with pt 1:1. Medications administered as prescribed. Writer encouraged pt to discuss feelings. Pt encouraged to come to staff with any questions or concerns.   R: Patient is safe on the unit. She is complaint with medications and denies any adverse reaction. Continue current POC.

## 2014-04-14 NOTE — Progress Notes (Signed)
Patient refused Neurontin at noon dose. Reports it makes her too sleepy and she just sleeps all day after taking it.

## 2014-04-15 DIAGNOSIS — R45851 Suicidal ideations: Secondary | ICD-10-CM

## 2014-04-15 DIAGNOSIS — F459 Somatoform disorder, unspecified: Secondary | ICD-10-CM

## 2014-04-15 DIAGNOSIS — F319 Bipolar disorder, unspecified: Secondary | ICD-10-CM

## 2014-04-15 DIAGNOSIS — F259 Schizoaffective disorder, unspecified: Principal | ICD-10-CM

## 2014-04-15 NOTE — BHH Group Notes (Signed)
[facility] Group Notes:  (Nursing/MHT/Case Management/Adjunct)  Date:  04/15/2014  Time:  5:21 PM  Type of Therapy:  Psychoeducational Skills  Participation Level:  Did Not Attend  Ventura Sellers 04/15/2014, 5:21 PM

## 2014-04-15 NOTE — Progress Notes (Signed)
D: Pt presents with labile, anxious affect and suspicious, labile mood.  Pt was very pleasant and smiling at the beginning of the shift and reported her goal for the day was "not to be so OCD and to self-soothe."  After group, pt could be seen pacing hall and seemed mildly confused, stating "I need help dialing this phone number.  I don't know how to dial long distance on this phone."  Pt was given instructions on how to call someone long distance and pt was still confused.  Pt then relayed to writer that the number was not long distance stating "it's a 336 area code, it's my mom."  Pt was hesitant to enter room where phone was located, stating "please don't lock me in there."  Writer showed pt that the door is unlocked from the inside, and pt cautiously entered room.  Writer dialed phone number for pt and handed pt the phone.  Pt returned to Probation officer, stating "Where is my mother?  Do you know where she is?  I heard her voice in there, but I don't know where she is.  What if something has happened to her?"  Suggested for pt to leave a message for her mother since it went straight to voicemail.  Pt did this after Probation officer dialed the phone number again.  Pt denies SI/HI.  Pt denies [place]. A: Supported and encouraged pt.  Encouraged pt to take medications per order.  Safety maintained.   R: Pt attended evening group.  Pt interacted with peers minimally.  Pt interacted with staff suspiciously at times and appropiately at others.  Pt was compliant with HS medications except Abilify 10mg , which she refused stating "I'm not going to take that unless you tell me where my mother is.  That is too much Abilify.  That would just knock me out.  I'm not taking that."  Pt verbally contracted for safety and is in no acute distress at this time.  Will continue to monitor and assess for safety.

## 2014-04-15 NOTE — Progress Notes (Signed)
Nursing Shift Note D: passive SI with no plan, pt agreed with staff verbal contract for safety. No noted AVH, HI, or pain. Pt reports she slept fair last night, low energy level, and rated depression and anxiety #5. Pt reports increased anxiety due to OCD. Pt daily goal is to talk with staff to decrease anxiety. A: did not attend program group. Pt questioned medications, writer explained importance of meds, pt medication compliant. Support and encouragement given. Will continue to monitor for safety. R: pt is in a safe and therapeutic milieu. Will continue to follow treatment plan.

## 2014-04-15 NOTE — BHH Group Notes (Signed)
[facility] Group Notes:  (Nursing/MHT/Case Management/Adjunct)  Date:  04/15/2014  Time:  10:58 AM  Type of Therapy:  Psychoeducational Skills  Participation Level:  Did Not Attend  Ventura Sellers 04/15/2014, 10:58 AM

## 2014-04-15 NOTE — BHH Group Notes (Signed)
[facility] Group Notes:  (Clinical Social Work)  04/15/2014  11:15-12:00PM  Summary of Progress/Problems:   The main focus of today's process group was to discuss patients' feelings about hospitalization, the stigma attached to mental health, and sources of motivation to stay well.  We then worked to identify a specific plan to avoid future hospitalizations when discharged from the hospital for this admission.  The patient expressed herself fluently throughout group, and even when she was talking about difficult and emotional matters, did so with an incongruent wide smile.    Type of Therapy:  Group Therapy - Process  Participation Level:  Active  Participation Quality:  Attentive, Sharing and Supportive  Affect:  Excited and Not Congruent  Cognitive:  Alert  Insight:  Developing/Improving  Engagement in Therapy:  Developing/Improving  Modes of Intervention:  Exploration, Discussion  Selmer Dominion, LCSW 04/15/2014, 4:40 PM

## 2014-04-15 NOTE — Progress Notes (Signed)
Patient ID: [name], female   DOB: [birth year], 53 y.o.   MRN: [id number] [facility] MD Progress Note  04/15/2014 10:23 AM MAURICE RAMSEUR  MRN:  [id number] Subjective: " I am sleeping better and am emotionally better"  Objective : Patient seen and chart reviewed.   Pt states she is putting some things together and is feeling less stressed. She slept well last night with Ambien.  Reports her family was not supporting her and were not giving financial support when she needed it. Pt continues to express some paranoia regarding her family. Pt denies contact with family since admission.   Pt pulled off her sock and read the writing. Stating the letters had meaning for her but could not say what. Pt has ideas of reference. Denies AVH.  Pt reports passive SI due to fear of what will happen on d/c.   States Ambien helps with muscle weakness. Pt refusing to take Abilify, Neurotonin and Lithium.   Per staff patient has been noncompliant with some of her medications. Per staff pt threatened to kill her roommate. Pt denies threatening to kill her roommate and denies HI.   Diagnosis:   DSM5: Primary psychiatric diagnosis:  Schizoaffective disorder,bipolar type,multiple episodes ,currently in acute episode   Secondary psychiatric diagnosis:  R/O Somatic symptom disorder,persistent,moderate   Non psychiatric diagnosis:  Hx of generalized weakness (all work up wnl)  IBS  Hx of duodenal ulcer  Hx of frequency of urination      ADL's:  Intact  Sleep: Good  Appetite:  Poor  Suicidal Ideation:  Passive SI without plan or intent Homicidal Ideation:  denies AEB (as evidenced by):  Psychiatric Specialty Exam: Physical Exam  Constitutional: She appears well-developed and well-nourished.  Psychiatric: Her speech is normal and behavior is normal. Thought content normal. Her affect is inappropriate. Cognition and memory are normal. She expresses inappropriate judgment.    Review of Systems   Constitutional: Positive for malaise/fatigue.  HENT: Positive for congestion. Negative for sore throat.   Respiratory: Positive for cough. Negative for wheezing.   Cardiovascular: Positive for chest pain and leg swelling.  Gastrointestinal: Positive for diarrhea. Negative for nausea and vomiting.  Musculoskeletal: Positive for back pain and joint pain.  Skin: Negative.   Neurological: Positive for focal weakness. Negative for tingling, tremors and seizures.  Psychiatric/Behavioral: Positive for suicidal ideas.    Blood pressure 126/76, pulse 104, temperature 98.3 F (36.8 C), temperature source Oral, resp. rate 18, height 4\' 10"  (1.473 m), weight 64.411 kg (142 lb), SpO2 98.00%.Body mass index is 29.69 kg/(m^2).  General Appearance: Casual  Eye Contact::  Fair  Speech:  Normal Rate  Volume:  Normal  Mood:  Euthymic  Affect:  Inappropriate  Thought Process:  Loose and Tangential  Orientation:  Full (Time, Place, and Person)  Thought Content:  Delusions, Ideas of Reference:   Paranoia Delusions and Paranoid Ideation  Suicidal Thoughts:  Yes.  without intent/plan  Homicidal Thoughts:  No  Memory:  Negative  Judgement:  Impaired  Insight:  Lacking  Psychomotor Activity:  Decreased  Concentration:  Fair  Recall:  [place] of Knowledge:Good  Language: Fair  Akathisia:  No    AIMS (if indicated):   0  Assets:  Communication Skills  Sleep:  Number of Hours: 5.25   Musculoskeletal: Strength & Muscle Tone: within normal limits Gait & Station: normal Patient leans: N/A  Current Medications: Current Facility-Administered Medications  Medication Dose Route Frequency Provider Last Rate Last Dose  .  acetaminophen (TYLENOL) tablet 650 mg  650 mg Oral Q6H PRN Clarene Reamer, MD      . alum & mag hydroxide-simeth (MAALOX/MYLANTA) 200-200-20 MG/5ML suspension 30 mL  30 mL Oral Q4H PRN Clarene Reamer, MD      . ARIPiprazole (ABILIFY) tablet 10 mg  10 mg Oral QHS Saramma Eappen, MD       . gabapentin (NEURONTIN) capsule 200 mg  200 mg Oral BID Ursula Alert, MD   200 mg at 04/15/14 8756  . ibuprofen (ADVIL,MOTRIN) tablet 600 mg  600 mg Oral Q8H PRN Clarene Reamer, MD      . lithium carbonate capsule 300 mg  300 mg Oral QHS Clarene Reamer, MD   300 mg at 04/13/14 2121  . magnesium hydroxide (MILK OF MAGNESIA) suspension 30 mL  30 mL Oral Daily PRN Clarene Reamer, MD      . ondansetron [facility]) tablet 4 mg  4 mg Oral Q8H PRN Clarene Reamer, MD      . zolpidem [facility]) tablet 5 mg  5 mg Oral QHS Ursula Alert, MD   5 mg at 04/14/14 2226    Lab Results: No results found for this or any previous visit (from the past 34 hour(s)).  Physical Findings: AIMS:  , ,  ,  ,    CIWA:    COWS:     Treatment Plan Summary: Daily contact with patient to assess and evaluate symptoms and progress in treatment Medication management  Plan:  Pt has agreed to start taking meds Will continue Abilify 10 mg po qhs for psychosis. Will continue gabapentin 200mg  BID dosing since patient does not want to be in bed all day. Will continue Lithium at 300 mg qHS,will not make any changes with that since she has prior side effects when it was increased. Will continue Ambien 5mg  po qHS for sleep. CSW will work on disposition. Patient to participate in therapeutic milieu.       Medical Decision Making Problem Points:  Established problem, stable/improving (1), Review of last therapy session (1) and Review of psycho-social stressors (1) Data Points:  Review or order medicine tests (1) Review and summation of old records (2) Review of medication regiment & side effects (2)  I certify that inpatient services furnished can reasonably be expected to improve the patient's condition.   Charlcie Cradle 04/15/2014, 10:23 AM

## 2014-04-15 NOTE — Progress Notes (Signed)
[facility] Group Notes:  (Nursing/MHT/Case Management/Adjunct)  Date:  04/15/2014  Time:  3:03 PM  Type of Therapy:  Psychoeducational Skills  Participation Level:  Did Not Attend  Summary of Progress/Problems: Pts played [place] using coping skills. Pt was resting in her bed at the time of group.  Clint Bolder 04/15/2014, 3:03 PM

## 2014-04-15 NOTE — Progress Notes (Signed)
The focus of this group is to help patients review their daily goal of treatment and discuss progress on daily workbooks. Pt attended the evening group session but responded minimally to all discussion prompts from the [place]. Pt appeared uncomfortable in the group and visibly anxious, though she said she did have a good day on the unit. "I made some friends. There are good people here." Pt's only additional request this evening, which she made several times to no avail, was for bottled water. Pt was told we only had regular pitchers of ice water, but still inquired about bottled product. Pt's affect was distracted and anxious.
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
## 2014-04-16 MED ORDER — OLANZAPINE 5 MG PO TBDP
ORAL_TABLET | ORAL | Status: AC
  Administered 2014-04-16: 5 mg via ORAL
  Filled 2014-04-16: qty 1

## 2014-04-16 MED ORDER — DIPHENHYDRAMINE HCL 50 MG/ML IJ SOLN
INTRAMUSCULAR | Status: AC
Start: 2014-04-16 — End: 2014-04-16
  Administered 2014-04-16: 50 mg via INTRAVENOUS
  Filled 2014-04-16: qty 1

## 2014-04-16 MED ORDER — HALOPERIDOL LACTATE 5 MG/ML IJ SOLN
INTRAMUSCULAR | Status: AC
  Administered 2014-04-16: 10 mg via INTRAMUSCULAR
  Filled 2014-04-16: qty 2

## 2014-04-16 MED ORDER — OLANZAPINE 5 MG PO TBDP
5.0000 mg | ORAL_TABLET | Freq: Once | ORAL | Status: AC
  Administered 2014-04-16 (×2): 5 mg via ORAL

## 2014-04-16 MED ORDER — DIPHENHYDRAMINE HCL 50 MG/ML IJ SOLN
50.0000 mg | Freq: Once | INTRAMUSCULAR | Status: AC
  Administered 2014-04-16 (×2): 50 mg via INTRAVENOUS

## 2014-04-16 MED ORDER — HALOPERIDOL LACTATE 5 MG/ML IJ SOLN
10.0000 mg | Freq: Once | INTRAMUSCULAR | Status: AC
  Administered 2014-04-16: 10 mg via INTRAMUSCULAR

## 2014-04-16 MED ORDER — OLANZAPINE 10 MG IM SOLR
INTRAMUSCULAR | Status: AC
  Filled 2014-04-16: qty 10

## 2014-04-16 MED ORDER — LORAZEPAM 2 MG/ML IJ SOLN
2.0000 mg | Freq: Once | INTRAMUSCULAR | Status: AC
  Administered 2014-04-16: 2 mg via INTRAMUSCULAR

## 2014-04-16 MED ORDER — OLANZAPINE 10 MG IM SOLR: 5.0000 mg | Freq: Once | INTRAMUSCULAR | Status: DC

## 2014-04-16 MED ORDER — LORAZEPAM 1 MG PO TABS
1.0000 mg | ORAL_TABLET | Freq: Once | ORAL | Status: AC
  Administered 2014-04-16 (×2): 1 mg via ORAL

## 2014-04-16 MED ORDER — LORAZEPAM 1 MG PO TABS
ORAL_TABLET | ORAL | Status: AC
  Administered 2014-04-16: 1 mg via ORAL
  Filled 2014-04-16: qty 1

## 2014-04-16 MED ORDER — LORAZEPAM 2 MG/ML IJ SOLN
INTRAMUSCULAR | Status: AC
  Administered 2014-04-16: 2 mg via INTRAMUSCULAR
  Filled 2014-04-16: qty 1

## 2014-04-16 MED ORDER — LORAZEPAM 2 MG/ML IJ SOLN: 1.0000 mg | Freq: Once | INTRAMUSCULAR | Status: AC

## 2014-04-16 NOTE — BHH Group Notes (Signed)
[facility] Group Notes: (Clinical Social Work)   04/16/2014      Type of Therapy:  Group Therapy   Participation Level:  Did Not Attend    Selmer Dominion, LCSW 04/16/2014, 5:28 PM

## 2014-04-16 NOTE — Progress Notes (Signed)
Adult Activity Group Note   Date: 04/16/2014 Time: 11:15am   Group Topic: Wellness - Jeopardy   Participation Level: Active  Participation Quality: Appropriate  Affect: Confused  Activity: Patients participated in jeopardy like game, answering questions relating to wellness in categories such as Fruits, Fitness, Veggies, Exercise, and Food Safety   Additional comments: Pt attended group and participated some. Pt would blurt out random answers and then state "no that's not right, that's wrong" to correct answers. Pt needed redirection to stay focused.

## 2014-04-16 NOTE — Progress Notes (Signed)
Pt behavior is agitated on the unit, increased anxiety. Poor boundaries with peers. MD order given for medication administration.

## 2014-04-16 NOTE — Progress Notes (Signed)
Patient ID: [name], female   DOB: 1961/02/25, 53 y.o.   MRN: [id number] [facility] MD Progress Note  04/16/2014 12:31 PM [name]  MRN:  [id number] Subjective: " we can talk later. I don't want to talk today"  Objective : Patient seen and chart reviewed.  Pt refused to speak with Probation officer. Pt refused to meet in her room or in the consult room. She agreed to a brief discussion in the hallway. Pt was warned that others could hear the conversation and she smiled and stated "thats what I want".  When asked if she is experiencing AH she stated "I hear the crickets outside and your voice". Denies VH.  Denies depression and anxiety. She states her dogs are her family and refused to answer questions about SI.   Per staff patient has been noncompliant with some of her medications. Per staff pt has been monopolizing the nursing students and needs constant redirection.  Diagnosis:   DSM5: Primary psychiatric diagnosis:  Schizoaffective disorder,bipolar type,multiple episodes ,currently in acute episode   Secondary psychiatric diagnosis:  R/O Somatic symptom disorder,persistent,moderate   Non psychiatric diagnosis:  Hx of generalized weakness (all work up wnl)  IBS  Hx of duodenal ulcer  Hx of frequency of urination      ADL's:  Intact  Sleep: did not answer question  Appetite:  did not answer  Suicidal Ideation:  Refused to answe Homicidal Ideation:  denies AEB (as evidenced by):  Psychiatric Specialty Exam: Physical Exam  Constitutional: She appears well-developed and well-nourished.  Psychiatric: Her speech is normal. Her affect is inappropriate. She is agitated. Thought content is paranoid and delusional. Cognition and memory are normal. She expresses inappropriate judgment.    ROS  Blood pressure 149/87, pulse 125, temperature 98.3 F (36.8 C), temperature source Oral, resp. rate 20, height 4\' 10"  (1.473 m), weight 64.411 kg (142 lb), SpO2 98.00%.Body mass index is  29.69 kg/(m^2).  General Appearance: Casual  Eye Contact::  Fair  Speech:  Normal Rate  Volume:  Normal  Mood:  Euthymic  Affect:  Inappropriate  Thought Process:  Loose and Tangential  Orientation:  Full (Time, Place, and Person)  Thought Content:  Delusions, Ideas of Reference:   Paranoia Delusions and Paranoid Ideation  Suicidal Thoughts:  Refused to answer  Homicidal Thoughts:  Refused to answer  Memory:  Negative  Judgement:  Impaired  Insight:  Lacking  Psychomotor Activity:  Normal  Concentration:  Poor  Recall:  Poor  Fund of Knowledge:Poor  Language: Fair  Akathisia:  No  Handed: right  AIMS (if indicated):   0  Assets:  Communication Skills  Sleep:  Number of Hours: 4.5   Musculoskeletal: Strength & Muscle Tone: within normal limits Gait & Station: normal Patient leans: N/A  Current Medications: Current Facility-Administered Medications  Medication Dose Route Frequency Provider Last Rate Last Dose  . acetaminophen (TYLENOL) tablet 650 mg  650 mg Oral Q6H PRN Clarene Reamer, MD      . alum & mag hydroxide-simeth (MAALOX/MYLANTA) 200-200-20 MG/5ML suspension 30 mL  30 mL Oral Q4H PRN Clarene Reamer, MD      . ARIPiprazole (ABILIFY) tablet 10 mg  10 mg Oral QHS Saramma Eappen, MD      . gabapentin (NEURONTIN) capsule 200 mg  200 mg Oral BID Ursula Alert, MD   200 mg at 04/16/14 0745  . ibuprofen (ADVIL,MOTRIN) tablet 600 mg  600 mg Oral Q8H PRN Clarene Reamer, MD      .  lithium carbonate capsule 300 mg  300 mg Oral QHS Clarene Reamer, MD   300 mg at 04/15/14 2212  . magnesium hydroxide (MILK OF MAGNESIA) suspension 30 mL  30 mL Oral Daily PRN Clarene Reamer, MD      . ondansetron [facility]) tablet 4 mg  4 mg Oral Q8H PRN Clarene Reamer, MD      . zolpidem [facility]) tablet 5 mg  5 mg Oral QHS Ursula Alert, MD   5 mg at 04/15/14 2212    Lab Results: No results found for this or any previous visit (from the past 52 hour(s)).  Physical Findings: AIMS:  , ,   ,  ,    CIWA:    COWS:     Treatment Plan Summary: Daily contact with patient to assess and evaluate symptoms and progress in treatment Medication management  Plan: Pt noncompliant with meds Will continue Abilify 10 mg po qhs for psychosis. Will continue gabapentin 200mg  BID dosing since patient does not want to be in bed all day. Will continue Lithium at 300 mg qHS,will not make any changes with that since she has prior side effects when it was increased. Will continue Ambien 5mg  po qHS for sleep. CSW will work on disposition. Patient to participate in therapeutic milieu. Will place pt on 1:1 due to intrusive behavior       Medical Decision Making Problem Points:  Established problem, worsening (2) and Review of psycho-social stressors (1) Data Points:  Review of medication regiment & side effects (2)  I certify that inpatient services furnished can reasonably be expected to improve the patient's condition.   Charlcie Cradle 04/16/2014, 12:31 PM

## 2014-04-16 NOTE — Progress Notes (Signed)
Nursing Shift Note D: pt states she slept poorly and is tired. Low energy level, poor concentration. Pt reports low depression and anxiety levels. Pt states daily goal is to face her trauma. Pt noted speaking with nursing student. Pt refused to speak with MD. Poor boundaries with peers. Pt requires frequent redirection. Delusions present. A: meds given per MD order. Support given. Will continue to monitor for safety. R: pt is in a safe and therapeutic milieu. Will continue to follow treatment plan.

## 2014-04-16 NOTE — Progress Notes (Signed)
04/16/2014, 1:1 Nursing Note D: No acute distress noted. Pt very intrusive requiring frequent redirection. Pt has become paranoid with delusional thinking, thinking the nurses was trying to kill her, refusing to speak to the doctor. Active hallucinating, reporting crickets were talking to her. Pt walks into other patients rooms. Poor boundaries with peers. A: one to one initiated for patient safety. R: patient safety maintain. MHT present.

## 2014-04-16 NOTE — Plan of Care (Signed)
Problem: Ineffective individual coping Goal: STG: Patient will remain free from self harm Outcome: Progressing Pt has remained free from self harm this shift.    Problem: Diagnosis: Increased Risk For Suicide Attempt Goal: STG-Patient Will Attend All Groups On The Unit Outcome: Progressing Attended evening group on 04/15/14 Goal: STG-Patient Will Report Suicidal Feelings to Staff Outcome: Progressing Pt denied SI this shift and agreed to notify staff if thinking of harming herself.   Goal: STG-Patient Will Comply With Medication Regime Outcome: Not Progressing Pt refused Abilify 10mg  PO 04/15/14 at HS

## 2014-04-16 NOTE — Progress Notes (Addendum)
D Pt. Is presently on a 1:1 to ensure pt. Safety.  Writer did a note in patients chart at 20:00.  Pt. Is resting quietly.  Writer will continue to monitor the pt. For safety and the sitter will remain at the pt's side. Pt.'s respirations were even and unlabored, pt. Is snoring softly. NO signs of distress or discomfort noted.

## 2014-04-16 NOTE — Progress Notes (Addendum)
Pt disruptive in milieu, not cooperative with redirections, attempt to elope unit via both exit doors. Verbal threats from pt towards staff, "I will damn all of you to hell"! P.O. medication not effective. IM medication administered per MD order. Will continue to monitor and stabilize. MHT with patient will continue one to one intervention to ensure safety.

## 2014-04-16 NOTE — [facility] Group Notes (Signed)
[facility] Group Notes:  (Nursing/MHT/Case Management/Adjunct)  Date:  04/16/2014  Time:  11:29 AM  Type of Therapy:  Nurse Education  Participation Level:  Minimal  Participation Quality:  Intrusive  Affect:  Flat  Cognitive:  Disorganized  Insight:  Lacking  Engagement in Group:  Lacking  Modes of Intervention:  Discussion  Summary of Progress/Problems: PT DID ATTEND HEALTHY SUPPORT SYSTEMS GROUP.   Benancio Deeds Shanta 04/16/2014, 11:29 AM

## 2014-04-16 NOTE — Progress Notes (Signed)
04/16/2014 1:1 Nursing Note  D: No acute distress. Medication is effective. Patient is asleep and snoring.  A: one to one continued for patient safety. Continue to monitor Q15 minutes for safety per protocol. R: patient is safe. MHT present.

## 2014-04-16 NOTE — [facility] Group Notes (Signed)
[place] Group Notes:  (Nursing/MHT/Case Management/Adjunct)  Date:  04/16/2014  Time:  11:16 AM  Type of Therapy:  Nurse Education  Participation Level:  Minimal  Participation Quality:  Inattentive  Affect:  Resistant  Cognitive:  Disorganized  Insight:  Lacking  Engagement in Group:  Lacking  Modes of Intervention:  Discussion  Summary of Progress/Problems: Pt did attend self inventory group, pt reported being negative SI/HI, and AH/VH noted.   Benancio Deeds Shanta 04/16/2014, 11:16 AM

## 2014-04-16 NOTE — Progress Notes (Signed)
Did not attend group 

## 2014-04-17 MED ORDER — DOCUSATE SODIUM 100 MG PO CAPS
100.0000 mg | ORAL_CAPSULE | Freq: Two times a day (BID) | ORAL | Status: DC
  Administered 2014-04-18 – 2014-04-23 (×5): 100 mg via ORAL
  Filled 2014-04-17 (×2): qty 1
  Filled 2014-04-17: qty 8
  Filled 2014-04-17 (×3): qty 1
  Filled 2014-04-17: qty 8
  Filled 2014-04-17 (×14): qty 1

## 2014-04-17 MED ORDER — ARIPIPRAZOLE 15 MG PO TABS
15.0000 mg | ORAL_TABLET | Freq: Every day | ORAL | Status: DC
  Filled 2014-04-17 (×2): qty 1

## 2014-04-17 NOTE — [facility] Group Notes (Signed)
[place] LCSW Group Therapy  04/17/2014 1:15 pm  Type of Therapy: Process Group Therapy  Participation Level:  Active  Participation Quality:  Appropriate  Affect:  Flat  Cognitive:  Oriented  Insight:  Improving  Engagement in Group:  Limited  Engagement in Therapy:  Limited  Modes of Intervention:  Activity, Clarification, Education, Problem-solving and Support  Summary of Progress/Problems: Today's group addressed the issue of overcoming obstacles.  Patients were asked to identify their biggest obstacle post d/c that stands in the way of their on-going success, and then problem solve as to how to manage this.  Came initially, soon left.  Did not return.  [name] B 04/17/2014   1:45 PM

## 2014-04-17 NOTE — Progress Notes (Signed)
D   Pt is pleasant on approach   She is disorganized and confused   Talking about ambien for ocd and anxiety  She is unpredictable and confused   Pt requested something for anxiety and was instructed to wait an hour and she could take medication  Pt did not appear to be in distress and was agreeable to wait if she could just have some gingerale A   She is on a 1:1   Pt received ginger ale R   She is safe at present

## 2014-04-17 NOTE — Progress Notes (Signed)
Patient ID: [name], female   DOB: August 21, 1960, 53 y.o.   MRN: [id number]  1:1 Nursing Note-  Patient is seen in her room on approach. MHT is within reach for safety precautions. Patient reports that she needs a laxative. Patient states, "I can see premonitions." Patient denies SI/HI and A/V hallucinations at this time. Patient denies pain as well. Patient states that she wants Ambien to help her 'jaw weakness' and it needs to be approved by the FDA. Writer informed patient that it was a long process but patient needed to take her scheduled medications now. Patient was cooperative and calm with Probation officer. MHT continues to be in reach and 1:1 is continued. Q15 minute safety checks are maintained as well.

## 2014-04-17 NOTE — Progress Notes (Signed)
Patient ID: [name], female   DOB: 1960-12-03, 53 y.o.   MRN: [id number] [facility] MD Progress Note  04/17/2014 12:47 PM [name]  MRN:  [id number] Subjective: " I have premonitions"  Objective : Patient seen and chart reviewed. Patient seen in her room ,with sitter today. Patient was placed on 1:1 secondary to intrusive behavior. Patient appeared to cooperative and pleasant today. She denies any mood lability but continues to be delusional and thinks she has premonitions and can read people. She denies any AH/VH today.   Per staff patient has been noncompliant with some of her medications. Patient continues to be somatic and med seeking.    Diagnosis:   DSM5: Primary psychiatric diagnosis:  Schizoaffective disorder,bipolar type,multiple episodes ,currently in acute episode   Secondary psychiatric diagnosis:  Somatic symptom disorder,persistent,moderate   Non psychiatric diagnosis:  Hx of generalized weakness (all work up wnl)  IBS  Hx of duodenal ulcer  Hx of frequency of urination      ADL's:  Intact  Sleep: did not answer question  Appetite:  did not answer  Suicidal Ideation:  Refused to answe Homicidal Ideation:  denies AEB (as evidenced by):  Psychiatric Specialty Exam: Physical Exam  Constitutional: She appears well-developed and well-nourished.  Psychiatric: Her speech is normal. Her affect is inappropriate. Thought content is paranoid and delusional. Cognition and memory are normal. She expresses inappropriate judgment. She is inattentive.    Review of Systems  Psychiatric/Behavioral: Positive for hallucinations (delsuional). Negative for suicidal ideas. The patient is nervous/anxious.     Blood pressure 130/81, pulse 124, temperature 97.1 F (36.2 C), temperature source Oral, resp. rate 18, height 4\' 10"  (1.473 m), weight 64.411 kg (142 lb), SpO2 98.00%.Body mass index is 29.69 kg/(m^2).  General Appearance: Casual  Eye Contact::  Fair   Speech:  Normal Rate  Volume:  Normal  Mood:  Euthymic  Affect:  Inappropriate  Thought Process:  Loose and Tangential  Orientation:  Full (Time, Place, and Person)  Thought Content:  Delusions, Ideas of Reference:   Paranoia Delusions and Paranoid Ideation  Suicidal Thoughts: denies  Homicidal Thoughts: denies  Memory:  Negative  Judgement:  Impaired  Insight:  Lacking  Psychomotor Activity:  Normal  Concentration:  Poor  Recall:  Poor  Fund of Knowledge:Poor  Language: Fair  Akathisia:  No  Handed: right  AIMS (if indicated):   0  Assets:  Communication Skills  Sleep:  Number of Hours: 6.5   Musculoskeletal: Strength & Muscle Tone: within normal limits Gait & Station: normal Patient leans: N/A  Current Medications: Current Facility-Administered Medications  Medication Dose Route Frequency Provider Last Rate Last Dose  . acetaminophen (TYLENOL) tablet 650 mg  650 mg Oral Q6H PRN Clarene Reamer, MD      . alum & mag hydroxide-simeth (MAALOX/MYLANTA) 200-200-20 MG/5ML suspension 30 mL  30 mL Oral Q4H PRN Clarene Reamer, MD      . ARIPiprazole (ABILIFY) tablet 15 mg  15 mg Oral QHS Macil Crady, MD      . docusate sodium (COLACE) capsule 100 mg  100 mg Oral BID Ursula Alert, MD      . gabapentin (NEURONTIN) capsule 200 mg  200 mg Oral BID Ursula Alert, MD   200 mg at 04/17/14 0819  . ibuprofen (ADVIL,MOTRIN) tablet 600 mg  600 mg Oral Q8H PRN Clarene Reamer, MD      . lithium carbonate capsule 300 mg  300 mg Oral QHS Eula Fried  Lovena Le, MD   300 mg at 04/15/14 2212  . magnesium hydroxide (MILK OF MAGNESIA) suspension 30 mL  30 mL Oral Daily PRN Clarene Reamer, MD   30 mL at 04/17/14 0820  . OLANZapine (ZYPREXA) injection 5 mg  5 mg Intramuscular Once Charlcie Cradle, MD      . ondansetron [facility]) tablet 4 mg  4 mg Oral Q8H PRN Clarene Reamer, MD      . zolpidem [facility]) tablet 5 mg  5 mg Oral QHS Ursula Alert, MD   5 mg at 04/15/14 2212    Lab Results: No  results found for this or any previous visit (from the past 67 hour(s)).  Physical Findings: AIMS: Facial and Oral Movements Muscles of Facial Expression: None, normal Lips and Perioral Area: None, normal Jaw: None, normal Tongue: None, normal,Extremity Movements Upper (arms, wrists, hands, fingers): None, normal Lower (legs, knees, ankles, toes): None, normal, Trunk Movements Neck, shoulders, hips: None, normal, Overall Severity Severity of abnormal movements (highest score from questions above): None, normal Incapacitation due to abnormal movements: None, normal Patient's awareness of abnormal movements (rate only patient's report): No Awareness, Dental Status Current problems with teeth and/or dentures?: No Does patient usually wear dentures?: No  CIWA:    COWS:     Treatment Plan Summary: Daily contact with patient to assess and evaluate symptoms and progress in treatment Medication management  Plan: Pt noncompliant with meds Will increase Abilify to 15 mg po qhs for psychosis. Will continue gabapentin 200mg  BID dosing since patient does not want to be in bed all day. Will continue Lithium at 300 mg qHS,will not make any changes with that since she has prior side effects when it was increased. Will continue Ambien 5mg  po qHS for sleep. CSW will work on disposition. Patient to participate in therapeutic milieu. Will continue  pt on 1:1 due to intrusive behavior. Will add colace for constipation.       Medical Decision Making Problem Points:  Established problem, worsening (2) and Review of psycho-social stressors (1) Data Points:  Review of medication regiment & side effects (2)  I certify that inpatient services furnished can reasonably be expected to improve the patient's condition.   Moses Odoherty ,MD 04/17/2014, 12:47 PM

## 2014-04-17 NOTE — [facility] Group Notes (Signed)
[facility] LCSW Aftercare Discharge Planning Group Note   04/17/2014 12:06 PM  Participation Quality:  Did not attend    [name]

## 2014-04-17 NOTE — Progress Notes (Signed)
Patient ID: [name], female   DOB: 1961-02-16, 53 y.o.   MRN: [id number]  1:1 Nursing Note-  Patient is seen in the hallway on approach and she is asking about "OCD medication." Writer explained to patient that there were no scheduled medications due at this time. Patient verbalized understanding. Patient's respirations are even and unlabored. Patient is calm and pleasant at this time. MHT is within reach and 1:1 is continued. Q15 minute safety checks maintained until discharge.

## 2014-04-17 NOTE — Progress Notes (Signed)
Pt. Continues to rest quietly, she has rolled over to her left side snoring softly.  Resp even unlabored, no signs of distress or discomfort noted at this time.  Continue 1:1 for pt. Safety.

## 2014-04-17 NOTE — Progress Notes (Signed)
Patient ID: [name], female   DOB: 1961-01-05, 53 y.o.   MRN: [id number]  1:1 Nursing Note-  Patient is in the shower when writer approached for 1200 assessment of patient. Patient was not having a conversation or making any noise at this time. MHT was within reach. No signs of distress at this time. 1:1 observation continued and Q15 minute safety checks maintained.

## 2014-04-17 NOTE — Progress Notes (Signed)
1:1 Monitoring Note: patient remains on 1:1 monitoring for safety. Lying in bed, eyes closed. Respiration even. Hands and face visible. Will continue to monitor patient for safety.

## 2014-04-18 MED ORDER — HYDROXYZINE HCL 50 MG PO TABS
50.0000 mg | ORAL_TABLET | Freq: Three times a day (TID) | ORAL | Status: DC | PRN
  Administered 2014-04-19 – 2014-04-23 (×4): 50 mg via ORAL
  Filled 2014-04-18 (×5): qty 1

## 2014-04-18 MED ORDER — ZOLPIDEM TARTRATE 5 MG PO TABS
5.0000 mg | ORAL_TABLET | Freq: Once | ORAL | Status: AC
  Administered 2014-04-18: 5 mg via ORAL

## 2014-04-18 MED ORDER — LORAZEPAM 1 MG PO TABS
2.0000 mg | ORAL_TABLET | Freq: Once | ORAL | Status: AC
  Administered 2014-04-18: 2 mg via ORAL
  Filled 2014-04-18: qty 2

## 2014-04-18 MED ORDER — ARIPIPRAZOLE 10 MG PO TABS
20.0000 mg | ORAL_TABLET | Freq: Every day | ORAL | Status: DC
  Filled 2014-04-18 (×3): qty 2

## 2014-04-18 MED ORDER — ZOLPIDEM TARTRATE 10 MG PO TABS
10.0000 mg | ORAL_TABLET | Freq: Every day | ORAL | Status: DC
  Administered 2014-04-19 – 2014-04-23 (×3): 10 mg via ORAL
  Filled 2014-04-18 (×5): qty 1

## 2014-04-18 MED ORDER — ZOLPIDEM TARTRATE 5 MG PO TABS
5.0000 mg | ORAL_TABLET | Freq: Every evening | ORAL | Status: DC | PRN
  Administered 2014-04-18: 5 mg via ORAL
  Filled 2014-04-18: qty 1

## 2014-04-18 MED ORDER — ZOLPIDEM TARTRATE 5 MG PO TABS
5.0000 mg | ORAL_TABLET | Freq: Every day | ORAL | Status: DC
  Administered 2014-04-18: 5 mg via ORAL
  Filled 2014-04-18 (×2): qty 1

## 2014-04-18 MED ORDER — ZOLPIDEM TARTRATE 10 MG PO TABS: 10.0000 mg | ORAL_TABLET | Freq: Every day | ORAL | Status: DC

## 2014-04-18 NOTE — Progress Notes (Signed)
Patient complained of diarrhea this afternoon.  Discussed with NP. Plan to monitor before getting PRN ordered. Pt is on Colace which will be held at 5 pm. Patient has rested in her room this afternoon but has attended groups.  Coninues to write peculiar messages.  Mixes up words  Extremely disorganized thinking. No SI, HI.[name] Kitchen  Continue 1:1. Patient safe.

## 2014-04-18 NOTE — Progress Notes (Signed)
Pt refused her abilify medication this evening and said the dose was too high   She was not able to go to sleep so she required additional medications    She has been writing a lot and writing backwards and making disorganized statements and writing them down    Pt is unpredictable and confused    Pt is on a 1:1 and is presently safe

## 2014-04-18 NOTE — Progress Notes (Signed)
Patient on 1:1  Is currently asleep and safe with MHT at side.

## 2014-04-18 NOTE — Tx Team (Signed)
  Interdisciplinary Treatment Plan Update   Date Reviewed:  04/18/2014  Time Reviewed:  8:24 AM  Progress in Treatment:   Attending groups: Yes Participating in groups: Yes Taking medication as prescribed: Yes  Tolerating medication: Yes Family/Significant other contact made: Yes  Patient understands diagnosis: Yes  Discussing patient identified problems/goals with staff: Yes  See initial care plan Medical problems stabilized or resolved: Yes Denies suicidal/homicidal ideation: Yes  In tx team Patient has not harmed self or others: Yes  For review of initial/current patient goals, please see plan of care.  Estimated Length of Stay:  3-4 days  Reason for Continuation of Hospitalization: Medication stabilization Other; describe Disorganization  New Problems/Goals identified:  N/A  Discharge Plan or Barriers:   We have asked her mother if Ebba could stay there for awhile at d/c.  Mother is considering it.  Additional Comments:  Pt refused her abilify medication last evening and said the dose was too high She was not able to go to sleep so she required additional medications She has been making disorganized statements, and then writing them down, but writing backwards. Attendees:  Signature: Steva Colder, MD 04/18/2014 8:24 AM   Signature: Ripley Fraise, LCSW 04/18/2014 8:24 AM  Signature: Elmarie Shiley, NP 04/18/2014 8:24 AM  Signature: Mayra Neer, RN 04/18/2014 8:24 AM  Signature: Darrol Angel, RN 04/18/2014 8:24 AM  Signature:  04/18/2014 8:24 AM  Signature:   04/18/2014 8:24 AM  Signature:    Signature:    Signature:    Signature:    Signature:    Signature:      Scribe for Treatment Team:   Ripley Fraise, LCSW  04/18/2014 8:24 AM

## 2014-04-18 NOTE — Progress Notes (Signed)
Patient ID: [name], female   DOB: [birth year], 53 y.o.   MRN: [id number] [facility] MD Progress Note  04/18/2014 12:24 PM ARIDAY BRINKER  MRN:  [id number] Subjective: " I am hearing voices '  Objective : Patient seen and chart reviewed. Patient seen in her room ,with sitter today. Patient appeared to be very delusional ,disorganized today. She is tangential and irrelevant . She reports not being able to sleep last night. She continues to be anxious ,labile.  Patient appears to have loose thought process ,making comments about superman . She reports AH as well as having special powers.  Per staff  patient continues to be somatic and med seeking. Patient was given ativan last night since she was labile ,could not sleep.    Diagnosis:   DSM5: Primary psychiatric diagnosis:  Schizoaffective disorder,bipolar type,multiple episodes ,currently in acute episode   Secondary psychiatric diagnosis:  Somatic symptom disorder,persistent,moderate   Non psychiatric diagnosis:  Hx of generalized weakness (all work up wnl)  IBS  Hx of duodenal ulcer  Hx of frequency of urination      ADL's:  Intact  Sleep: did not answer question  Appetite:  did not answer  Suicidal Ideation:  Refused to answe Homicidal Ideation:  denies AEB (as evidenced by):  Psychiatric Specialty Exam: Physical Exam  Constitutional: She appears well-developed and well-nourished.  Psychiatric: Her speech is normal. Her affect is inappropriate. Thought content is paranoid and delusional. Cognition and memory are normal. She expresses inappropriate judgment. She is inattentive.    Review of Systems  Psychiatric/Behavioral: Positive for hallucinations (delsuional). Negative for suicidal ideas. The patient is nervous/anxious.     Blood pressure 130/81, pulse 124, temperature 97.1 F (36.2 C), temperature source Oral, resp. rate 18, height 4\' 10"  (1.473 m), weight 64.411 kg (142 lb), SpO2 98.00%.Body mass index is  29.69 kg/(m^2).  General Appearance: Casual  Eye Contact::  Fair  Speech:  Normal Rate  Volume:  Normal  Mood:  Euthymic  Affect:  Inappropriate  Thought Process:  Loose and Tangential  Orientation:  Full (Time, Place, and Person)  Thought Content:  Delusions, Ideas of Reference:   Paranoia Delusions and Paranoid Ideation  Suicidal Thoughts: denies  Homicidal Thoughts: denies  Memory:  Negative  Judgement:  Impaired  Insight:  Lacking  Psychomotor Activity:  Normal  Concentration:  Poor  Recall:  Poor  Fund of Knowledge:Poor  Language: Fair  Akathisia:  No  Handed: right  AIMS (if indicated):   0  Assets:  Communication Skills  Sleep:  Number of Hours: 3.5   Musculoskeletal: Strength & Muscle Tone: within normal limits Gait & Station: normal Patient leans: N/A  Current Medications: Current Facility-Administered Medications  Medication Dose Route Frequency Provider Last Rate Last Dose  . acetaminophen (TYLENOL) tablet 650 mg  650 mg Oral Q6H PRN Clarene Reamer, MD      . alum & mag hydroxide-simeth (MAALOX/MYLANTA) 200-200-20 MG/5ML suspension 30 mL  30 mL Oral Q4H PRN Clarene Reamer, MD      . ARIPiprazole (ABILIFY) tablet 20 mg  20 mg Oral QHS Demitrios Molyneux, MD      . docusate sodium (COLACE) capsule 100 mg  100 mg Oral BID Ursula Alert, MD   100 mg at 04/18/14 1000  . gabapentin (NEURONTIN) capsule 200 mg  200 mg Oral BID Ursula Alert, MD   200 mg at 04/18/14 0932  . hydrOXYzine (ATARAX/VISTARIL) tablet 50 mg  50 mg Oral TID PRN Cyan Moultrie,  MD      . ibuprofen (ADVIL,MOTRIN) tablet 600 mg  600 mg Oral Q8H PRN Clarene Reamer, MD      . lithium carbonate capsule 300 mg  300 mg Oral QHS Clarene Reamer, MD   300 mg at 04/17/14 2210  . magnesium hydroxide (MILK OF MAGNESIA) suspension 30 mL  30 mL Oral Daily PRN Clarene Reamer, MD   30 mL at 04/17/14 0820  . OLANZapine (ZYPREXA) injection 5 mg  5 mg Intramuscular Once Charlcie Cradle, MD      . ondansetron  [facility]) tablet 4 mg  4 mg Oral Q8H PRN Clarene Reamer, MD      . zolpidem (AMBIEN) tablet 10 mg  10 mg Oral QHS Ursula Alert, MD        Lab Results: No results found for this or any previous visit (from the past 48 hour(s)).  Physical Findings: AIMS: Facial and Oral Movements Muscles of Facial Expression: None, normal Lips and Perioral Area: None, normal Jaw: None, normal Tongue: None, normal,Extremity Movements Upper (arms, wrists, hands, fingers): None, normal Lower (legs, knees, ankles, toes): None, normal, Trunk Movements Neck, shoulders, hips: None, normal, Overall Severity Severity of abnormal movements (highest score from questions above): None, normal Incapacitation due to abnormal movements: None, normal Patient's awareness of abnormal movements (rate only patient's report): No Awareness, Dental Status Current problems with teeth and/or dentures?: No Does patient usually wear dentures?: No  CIWA:    COWS:     Treatment Plan Summary: Daily contact with patient to assess and evaluate symptoms and progress in treatment Medication management  Plan: Pt noncompliant with meds Will increase Abilify to 20 po qhs for psychosis. Will continue gabapentin 200mg  BID dosing since patient does not want to be in bed all day. Will continue Lithium at 300 mg qHS,will not make any changes with that since she has prior side effects when it was increased. Will increase ambien to 10 mg po qhs. Will add vistaril prn for anxiety. Will try to avoid ativan and related meds 2/2 to high fall risk as well as cognitive changes . CSW will work on disposition. Patient to participate in therapeutic milieu. Will continue  pt on 1:1 due to intrusive behavior. Will add colace for constipation.       Medical Decision Making Problem Points:  Established problem, worsening (2) and Review of psycho-social stressors (1) Data Points:  Review of medication regiment & side effects (2)  I certify  that inpatient services furnished can reasonably be expected to improve the patient's condition.   Kamree Wiens ,MD 04/18/2014, 12:24 PM

## 2014-04-18 NOTE — Progress Notes (Signed)
Patient ID: [name], female   DOB: 1960-08-21, 53 y.o.   MRN: [id number] CSW spoke with Pt's mother, [name], [phone] , to update Mrs. Barthel on Pt's progress and to see if Mrs. Guevarra had been able to contact Pt.  Mrs. Andreason reported that she is still unsure as to what triggered this episode; she also expressed concerns about Pt's ability to live alone.  She also reported that Pt does not stay on the phone with her long and only calls to "see where I am and if I'm safe."  CSW inquired if Pt would be able to spend some time at Mrs. Straley's house when discharged in order to help with the transition from hospital to home.  Mrs. Hulon reported that she would consider that possibility.  CSW to follow-up.  Peri Maris, [place] 04/18/2014 11:26 AM

## 2014-04-18 NOTE — Progress Notes (Signed)
Adult Psychoeducational Group Note  Date:  04/18/2014 Time:  12:45 AM  Group Topic/Focus:  Wrap-Up Group:   The focus of this group is to help patients review their daily goal of treatment and discuss progress on daily workbooks.  Participation Level:  Minimal  Participation Quality:  Drowsy  Affect:  Flat  Cognitive:  Oriented  Insight: Lacking  Engagement in Group:  Limited  Modes of Intervention:  Socialization and Support  Additional Comments:  Patient attended and participated in group tonight. She reports having an interesting day. Today she worked on getting well by getting support for staff members. She eat, attended her groups and spoke with her doctor. She advised that wellness to her is the whole body healthy   Debe Coder 04/18/2014, 12:45 AM

## 2014-04-18 NOTE — Progress Notes (Signed)
The focus of this group is to educate the patient on the purpose and policies of crisis stabilization and provide a format to answer questions about their admission.  The group details unit policies and expectations of patients while admitted.  Patient did not attend 0900 nurse education orientation group this morning.   Patient stayed in her room with 1:1 present for safety.

## 2014-04-18 NOTE — Progress Notes (Signed)
Pt is intrusive and continues to be psychotic   She is presently up with a paper with disorganized sentences written backward asking to have the nurse come out so she can give it to her   Pt remains unpredictable and needs constant redirection    Pt on 1:1 and is presently safe

## 2014-04-18 NOTE — Progress Notes (Signed)
Patient ID: [name], female   DOB: May 11, 1961, 53 y.o.   MRN: [id number] Patient continues on 1:1 observation. Patient attended group. Now sleeping quietly.  Being monitored for safety and is safe at this time.

## 2014-04-18 NOTE — Progress Notes (Signed)
Pt is calmer today and thinking is less disorganized   She remains on a 1:1 for safety and unpredictability   Pt is safe at present

## 2014-04-18 NOTE — Telephone Encounter (Signed)
Labs were reviewed in my office note. H/o elevated LFT's in past ? Fatty liver. TG elevated at 251; needs significant reduction in sugar and carbs

## 2014-04-18 NOTE — BHH Group Notes (Signed)
[place] LCSW Group Therapy  04/18/2014 , 12:23 PM   Type of Therapy:  Group Therapy  Participation Level:  Active  Participation Quality:  Attentive  Affect:  Appropriate  Cognitive:  Alert  Insight:  Improving  Engagement in Therapy:  Engaged  Modes of Intervention:  Discussion, Exploration and Socialization  Summary of Progress/Problems: Today's group focused on the term Diagnosis.  Participants were asked to define the term, and then pronounce whether it is a negative, positive or neutral term.  Came for the last 5 minutes of group.  Told me she agreed with everything that I was saying.  [name] 04/18/2014 , 12:23 PM

## 2014-04-19 LAB — GLUCOSE, CAPILLARY: Glucose-Capillary: 208 mg/dL — ABNORMAL HIGH (ref 70–99)

## 2014-04-19 MED ORDER — ARIPIPRAZOLE 10 MG PO TABS
25.0000 mg | ORAL_TABLET | Freq: Every day | ORAL | Status: DC
  Administered 2014-04-19: 25 mg via ORAL
  Filled 2014-04-19 (×2): qty 3

## 2014-04-19 NOTE — Progress Notes (Signed)
Pt was much calmer around 420 am and was requesting ibuprophen for pain   She was not making delusional , paranoid statements   Pt on 1:1 and is presently safe

## 2014-04-19 NOTE — Progress Notes (Signed)
[facility] [facility] 1:1 Observation Documentation  For the first (8) hours following discontinuation of 1:1 precautions, a progress note entry by nursing staff should be documented at least every 2 hours, reflecting the patient's behavior, condition, mood, and conversation.  Use the progress notes for additional entries.  Time 1:1 discontinued:  0917  Patient's Behavior:  Patient is calm and smiling at the medication window receiving her evening medications.  Patient's Condition:  No distress noted. Respirations are even and unlabored at this time.  Patient's Conversation:  "I would like a cheeseburger. I'll be eating at 3 a.m." Patient also reports that she enjoyed working her in the past and would love to work here again.  Gaylan Gerold E 04/19/2014, 5:41 PM
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
## 2014-04-19 NOTE — BHH Group Notes (Signed)
[facility] LCSW Aftercare Discharge Planning Group Note   04/19/2014 11:06 AM  Participation Quality:  Engaged  Mood/Affect:  Appropriate  Depression Rating:    Anxiety Rating:    Thoughts of Suicide:  No Will you contract for safety?   NA  Current AVH:  No  Plan for Discharge/Comments:  [name] stayed for most of the group today.  Her comments were grounded in reality.  She was informed she was taken of 1:1 while in group, and she was happy about that.  Reported that she slept well last night and that she spoke to her sister by phone last evening.  Stated she missed an appointment at [facility] that needs to be rescheduled.  Transportation Means: family  Supports: family  [place], [name]

## 2014-04-19 NOTE — Progress Notes (Signed)
[place] [facility] 1:1 Observation Documentation  For the first (8) hours following discontinuation of 1:1 precautions, a progress note entry by nursing staff should be documented at least every 2 hours, reflecting the patient's behavior, condition, mood, and conversation.  Use the progress notes for additional entries.  Time 1:1 discontinued:  0917  Patient's Behavior:  Patient is seen in hallway walking towards her room. Patient is calm at this time.  Patient's Condition:  No distress noted. Patient's respirations are even and unlabored at this time.  Patient's Conversation:  Patient is having no conversation at this time.  Margaretmary Bayley, Sylvanus Telford E 04/19/2014, 3:20 PM

## 2014-04-19 NOTE — BHH Group Notes (Signed)
[facility] Group Therapy  04/19/2014 , 1:43 PM    Type of Therapy:  Mental [facility] Presentation  Participation Level:  Active  Participation Quality:  Attentive  Affect:  Blunted  Cognitive:  Oriented  Insight:  Limited  Engagement in Therapy:  Engaged  Modes of Intervention:  Discussion, Education and Socialization  Summary of Progress/Problems:  Shanon Brow from [place] came to present his recovery story and play the guitar.  Started out in group.  Soon excused herself, came back 15 minutes, stayed for 5 minutes and left again.  [name] 04/19/2014 , 1:43 PM

## 2014-04-19 NOTE — Progress Notes (Signed)
Patient ID: [name], female   DOB: 1961-03-25, 53 y.o.   MRN: [id number] [facility] MD Progress Note  04/19/2014 2:07 PM [name]  MRN:  [id number] Subjective: " I have premonitions"  Objective : Patient seen and chart reviewed. Patient seen in the hall way ,appears to be more steady on her feet. She continues to be delusional,disorganized ,making irrelevant statements . Reports sleep as good on the [place] . Appears to be less anxious . Reports wanting to go out today . Denies AH/VH/SI.  Per staff  patient continues to be disorganized at times ,writing stuff backward and bringing it to them. But patient was asked to write a sentence today during the morning eval ,she seems to be able to do it well ,no backward writing noted .    Diagnosis:   DSM5: Primary psychiatric diagnosis:  Schizoaffective disorder,bipolar type,multiple episodes ,currently in acute episode   Secondary psychiatric diagnosis:  Somatic symptom disorder,persistent,moderate   Non psychiatric diagnosis:  Hx of generalized weakness (all work up wnl)  IBS  Hx of duodenal ulcer  Hx of frequency of urination      ADL's:  Intact  Sleep: did not answer question  Appetite:  did not answer  Suicidal Ideation:  Refused to answe Homicidal Ideation:  denies AEB (as evidenced by):  Psychiatric Specialty Exam: Physical Exam  Constitutional: She appears well-developed and well-nourished.  Psychiatric: Her speech is normal. Her affect is inappropriate. Thought content is paranoid and delusional. Cognition and memory are normal. She expresses inappropriate judgment. She is inattentive.    Review of Systems  Psychiatric/Behavioral: Positive for hallucinations (delsuional). Negative for suicidal ideas. The patient is nervous/anxious.     Blood pressure 129/89, pulse 107, temperature 97.7 F (36.5 C), temperature source Oral, resp. rate 16, height 4\' 10"  (1.473 m), weight 64.411 kg (142 lb), SpO2 98.00%.Body  mass index is 29.69 kg/(m^2).  General Appearance: Casual  Eye Contact::  Fair  Speech:  Normal Rate  Volume:  Normal  Mood:  Euthymic  Affect:  Inappropriate  Thought Process:  Loose and Tangential  Orientation:  Full (Time, Place, and Person)  Thought Content:  Delusions, Ideas of Reference:   Paranoia Delusions and Paranoid Ideation  Suicidal Thoughts: denies  Homicidal Thoughts: denies  Memory:  Negative  Judgement:  Impaired  Insight:  Lacking  Psychomotor Activity:  Normal  Concentration:  Poor  Recall:  Poor  Fund of Knowledge:Poor  Language: Fair  Akathisia:  No  Handed: right  AIMS (if indicated):   0  Assets:  Communication Skills  Sleep:  Number of Hours: 3.75   Musculoskeletal: Strength & Muscle Tone: within normal limits Gait & Station: normal Patient leans: N/A  Current Medications: Current Facility-Administered Medications  Medication Dose Route Frequency Provider Last Rate Last Dose  . acetaminophen (TYLENOL) tablet 650 mg  650 mg Oral Q6H PRN Clarene Reamer, MD      . alum & mag hydroxide-simeth (MAALOX/MYLANTA) 200-200-20 MG/5ML suspension 30 mL  30 mL Oral Q4H PRN Clarene Reamer, MD      . ARIPiprazole (ABILIFY) tablet 25 mg  25 mg Oral QHS [name] Sigman, MD      . docusate sodium (COLACE) capsule 100 mg  100 mg Oral BID Ursula Alert, MD   100 mg at 04/18/14 1000  . gabapentin (NEURONTIN) capsule 200 mg  200 mg Oral BID Ursula Alert, MD   200 mg at 04/19/14 0858  . hydrOXYzine (ATARAX/VISTARIL) tablet 50 mg  50  mg Oral TID PRN Ursula Alert, MD      . ibuprofen (ADVIL,MOTRIN) tablet 600 mg  600 mg Oral Q8H PRN Clarene Reamer, MD   400 mg at 04/19/14 0420  . lithium carbonate capsule 300 mg  300 mg Oral QHS Clarene Reamer, MD   300 mg at 04/18/14 2206  . magnesium hydroxide (MILK OF MAGNESIA) suspension 30 mL  30 mL Oral Daily PRN Clarene Reamer, MD   30 mL at 04/17/14 0820  . OLANZapine (ZYPREXA) injection 5 mg  5 mg Intramuscular Once Charlcie Cradle, MD      . ondansetron [facility]) tablet 4 mg  4 mg Oral Q8H PRN Clarene Reamer, MD      . zolpidem Lorrin Mais) tablet 10 mg  10 mg Oral QHS Laverle Hobby, PA-C        Lab Results: No results found for this or any previous visit (from the past 48 hour(s)).  Physical Findings: AIMS: Facial and Oral Movements Muscles of Facial Expression: None, normal Lips and Perioral Area: None, normal Jaw: None, normal Tongue: None, normal,Extremity Movements Upper (arms, wrists, hands, fingers): None, normal Lower (legs, knees, ankles, toes): None, normal, Trunk Movements Neck, shoulders, hips: None, normal, Overall Severity Severity of abnormal movements (highest score from questions above): None, normal Incapacitation due to abnormal movements: None, normal Patient's awareness of abnormal movements (rate only patient's report): No Awareness, Dental Status Current problems with teeth and/or dentures?: No Does patient usually wear dentures?: No  CIWA:    COWS:     Treatment Plan Summary: Daily contact with patient to assess and evaluate symptoms and progress in treatment Medication management  Plan:  Will increase Abilify to 25 po qhs for psychosis. Will continue gabapentin 200mg  BID dosing since patient does not want to be in bed all day. Will continue Lithium at 300 mg qHS,will not make any changes with that since she has prior side effects when it was increased. Will continue  ambien to 10 mg po qhs. Will continue  vistaril prn for anxiety. Will try to avoid ativan and related meds 2/2 to high fall risk as well as cognitive changes . CSW will work on disposition. Patient to be dc ed to supervised environment for a while . Patient to participate in therapeutic milieu. Will continue colace for constipation.       Medical Decision Making Problem Points:  Established problem, worsening (2) and Review of psycho-social stressors (1) Data Points:  Review of medication regiment & side  effects (2)  I certify that inpatient services furnished can reasonably be expected to improve the patient's condition.   Shequila Neglia ,MD 04/19/2014, 2:07 PM
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
## 2014-04-19 NOTE — Progress Notes (Signed)
Patient ID: [name], female   DOB: 09-Jan-1961, 53 y.o.   MRN: [id number]  1:1 Nursing Note-   Patient is in her room in the bathroom upon first approach. Patient reports she is doing well today and not experiencing any pain or distress at the moment. Patient's respirations are even and unlabored. MHT is within reach and 1:1 is continued. Q15 minute safety checks are maintained as well. Patient rates her depression and hopelessness at 0/10 for the day. Patient rates her anxiety at 4/10 for the day. Patient reports that her goal for the day is to go outside.

## 2014-04-19 NOTE — Progress Notes (Signed)
[facility] 1:1 Observation Documentation  For the first (8) hours following discontinuation of 1:1 precautions, a progress note entry by nursing staff should be documented at least every 2 hours, reflecting the patient's behavior, condition, mood, and conversation.  Use the progress notes for additional entries.  Time 1:1 discontinued:  0917  Patient's Behavior:  Patient is sitting in the day room playing the piano. Patient is focused on the piano at this time.  Patient's Condition:  No distress noted. Patient is calm and respirations are even and unlabored.  Patient's Conversation:  No conversation at this time.  Margaretmary Bayley, Airik Goodlin E 04/19/2014, 1:29 PM

## 2014-04-19 NOTE — Progress Notes (Signed)
[place] 1:1 Observation Documentation  For the first (8) hours following discontinuation of 1:1 precautions, a progress note entry by nursing staff should be documented at least every 2 hours, reflecting the patient's behavior, condition, mood, and conversation.  Use the progress notes for additional entries.  Time 1:1 discontinued:  0917  Patient's Behavior:  Patient is calm and sitting on bench outside in the recreation area.  Patient's Condition:  No distress noted and in good condition.  Patient's Conversation:  Patient is talking to MHT but conversation is unknown to Probation officer at this time.  Gaylan Gerold E 04/19/2014, 11:53 AM

## 2014-04-19 NOTE — Progress Notes (Signed)
[facility] 1:1 Observation Documentation  For the first (8) hours following discontinuation of 1:1 precautions, a progress note entry by nursing staff should be documented at least every 2 hours, reflecting the patient's behavior, condition, mood, and conversation.  Use the progress notes for additional entries.  Time 1:1 discontinued:  0917 AM  Patient's Behavior:  Patient is happy because she her 1:1 observation is discontinued. Patient is calm and cooperative.  Patient's Condition:  Respirations are even and unlabored. No distress noted at this time.   Patient's Conversation:  "I am going to get some fresh air." Patient was going to leave group and step out in the hall but then decided she would stay for group.  Gaylan Gerold E 04/19/2014, 9:55 AM

## 2014-04-20 MED ORDER — ARIPIPRAZOLE 15 MG PO TABS
30.0000 mg | ORAL_TABLET | Freq: Every day | ORAL | Status: DC
  Filled 2014-04-20: qty 2

## 2014-04-20 MED ORDER — ARIPIPRAZOLE 15 MG PO TABS
25.0000 mg | ORAL_TABLET | Freq: Every day | ORAL | Status: DC
  Filled 2014-04-20 (×3): qty 1

## 2014-04-20 MED ORDER — IBUPROFEN 200 MG PO TABS
600.0000 mg | ORAL_TABLET | ORAL | Status: DC | PRN
  Administered 2014-04-20 – 2014-04-23 (×10): 600 mg via ORAL
  Filled 2014-04-20 (×9): qty 3

## 2014-04-20 MED ORDER — DICLOFENAC SODIUM 1 % TD GEL
4.0000 g | Freq: Four times a day (QID) | TRANSDERMAL | Status: DC
  Administered 2014-04-21 – 2014-04-24 (×11): 4 g via TOPICAL
  Filled 2014-04-20 (×2): qty 100

## 2014-04-20 NOTE — Tx Team (Signed)
  Interdisciplinary Treatment Plan Update   Date Reviewed:  04/20/2014  Time Reviewed:  11:31 AM  Progress in Treatment:   Attending groups: Yes Participating in groups: Yes Taking medication as prescribed: Yes  Tolerating medication: Yes Family/Significant other contact made: Yes  Patient understands diagnosis: Yes  Discussing patient identified problems/goals with staff: Yes  See initial care plan Medical problems stabilized or resolved: Yes Denies suicidal/homicidal ideation: Yes  In tx team Patient has not harmed self or others: Yes  For review of initial/current patient goals, please see plan of care.  Estimated Length of Stay:  3-4 days  Reason for Continuation of Hospitalization: Medication stabilization Other; describe disorganization  New Problems/Goals identified:  N/A  Discharge Plan or Barriers:   return home, follow up outpt  Additional Comments:  " I feel uncomfortable that a female tech here last night would sexually molest me "   Patient continues to be disorganized ,very paranoid about staff here ,reporting that they last night she was afraid she was going to be sexually molested by a tech. She continues to be grandiose that she has special Powers and that she gets premonitions.Denies SI.  Per staff patient continues to be disorganized at times ,but her mood seems to be brighter.  She has been refusing her Abilify.  She took it last night, but had refused the previous 2 nights.  Today the dose was increased to 25mg .   Attendees:  Signature: Steva Colder, MD 04/20/2014 11:31 AM   Signature: Ripley Fraise, LCSW 04/20/2014 11:31 AM  Signature:  04/20/2014 11:31 AM  Signature: Mayra Neer, RN 04/20/2014 11:31 AM  Signature:  04/20/2014 11:31 AM  Signature:  04/20/2014 11:31 AM  Signature:   04/20/2014 11:31 AM  Signature:    Signature:    Signature:    Signature:    Signature:    Signature:      Scribe for Treatment Team:   Ripley Fraise, LCSW  04/20/2014 11:31 AM

## 2014-04-20 NOTE — Progress Notes (Signed)
Patient ID: [name], female   DOB: 11-22-1960, 53 y.o.   MRN: [id number] [facility] MD Progress Note  04/20/2014 9:53 AM [name]  MRN:  [id number] Subjective: " I feel uncomfortable that a female tech here last night would sexually molest me "  Objective : Patient seen and chart reviewed. Patient continues to be disorganized ,very paranoid about staff here ,reporting that they last night she was afraid she was going to be sexually molested by a tech. She continues to be grandiose that she has special  Powers and that she gets premonitions.Denies  SI.  Per staff  patient continues to be disorganized at times ,but her mood seems to be brighter. She has been refusing some of her meds .    Diagnosis:   DSM5: Primary psychiatric diagnosis:  Schizoaffective disorder,bipolar type,multiple episodes ,currently in acute episode   Secondary psychiatric diagnosis:  Somatic symptom disorder,persistent,moderate   Non psychiatric diagnosis:  Hx of generalized weakness (all work up wnl)  IBS  Hx of duodenal ulcer  Hx of frequency of urination      ADL's:  Intact  Sleep: did not answer question  Appetite:  did not answer  Suicidal Ideation:  Refused to answe Homicidal Ideation:  denies AEB (as evidenced by):  Psychiatric Specialty Exam: Physical Exam  Constitutional: She appears well-developed and well-nourished.  Psychiatric: Her speech is normal. Her affect is inappropriate. Thought content is paranoid and delusional. Cognition and memory are normal. She expresses inappropriate judgment. She is inattentive.    Review of Systems  Psychiatric/Behavioral: Positive for hallucinations (delsuional, reports having special powers,paranoid that she will be molested here by staff). Negative for suicidal ideas. The patient is nervous/anxious.     Blood pressure 146/82, pulse 134, temperature 97.7 F (36.5 C), temperature source Oral, resp. rate 16, height 4\' 10"  (1.473 m), weight  64.411 kg (142 lb), SpO2 95.00%.Body mass index is 29.69 kg/(m^2).  General Appearance: Casual  Eye Contact::  Fair  Speech:  Normal Rate  Volume:  Normal  Mood:  Euthymic  Affect:  Inappropriate  Thought Process:  Loose and Tangential  Orientation:  Full (Time, Place, and Person)  Thought Content:  Delusions, Ideas of Reference:   Paranoia Delusions and Paranoid Ideation -that staff here will sexually molest her and that she has special powers.  Suicidal Thoughts: denies  Homicidal Thoughts: denies  Memory:  Negative  Judgement:  Impaired  Insight:  Lacking  Psychomotor Activity:  Normal  Concentration:  Poor  Recall:  Poor  Fund of Knowledge:Poor  Language: Fair  Akathisia:  No  Handed: right  AIMS (if indicated):   0  Assets:  Communication Skills  Sleep:  Number of Hours: 3.25   Musculoskeletal: Strength & Muscle Tone: within normal limits Gait & Station: normal Patient leans: N/A  Current Medications: Current Facility-Administered Medications  Medication Dose Route Frequency Provider Last Rate Last Dose  . acetaminophen (TYLENOL) tablet 650 mg  650 mg Oral Q6H PRN Clarene Reamer, MD      . alum & mag hydroxide-simeth (MAALOX/MYLANTA) 200-200-20 MG/5ML suspension 30 mL  30 mL Oral Q4H PRN Clarene Reamer, MD      . ARIPiprazole (ABILIFY) tablet 30 mg  30 mg Oral QHS Chasta Deshpande, MD      . docusate sodium (COLACE) capsule 100 mg  100 mg Oral BID Ursula Alert, MD   100 mg at 04/18/14 1000  . gabapentin (NEURONTIN) capsule 200 mg  200 mg Oral BID Railyn House  Tameca Jerez, MD   200 mg at 04/20/14 0755  . hydrOXYzine (ATARAX/VISTARIL) tablet 50 mg  50 mg Oral TID PRN Ursula Alert, MD   50 mg at 04/19/14 2336  . ibuprofen (ADVIL,MOTRIN) tablet 600 mg  600 mg Oral Q8H PRN Clarene Reamer, MD   600 mg at 04/20/14 6237  . lithium carbonate capsule 300 mg  300 mg Oral QHS Clarene Reamer, MD   300 mg at 04/19/14 2128  . magnesium hydroxide (MILK OF MAGNESIA) suspension 30 mL  30 mL  Oral Daily PRN Clarene Reamer, MD   30 mL at 04/17/14 0820  . OLANZapine (ZYPREXA) injection 5 mg  5 mg Intramuscular Once Charlcie Cradle, MD      . ondansetron [facility]) tablet 4 mg  4 mg Oral Q8H PRN Clarene Reamer, MD      . zolpidem Lorrin Mais) tablet 10 mg  10 mg Oral QHS Laverle Hobby, PA-C   10 mg at 04/19/14 2128    Lab Results:  Results for orders placed during the hospital encounter of 04/12/14 (from the past 48 hour(s))  GLUCOSE, CAPILLARY     Status: Abnormal   Collection Time    04/19/14  6:12 PM      Result Value Ref Range   Glucose-Capillary 208 (*) 70 - 99 mg/dL   Comment 1 Notify RN      Physical Findings: AIMS: Facial and Oral Movements Muscles of Facial Expression: None, normal Lips and Perioral Area: None, normal Jaw: None, normal Tongue: None, normal,Extremity Movements Upper (arms, wrists, hands, fingers): None, normal Lower (legs, knees, ankles, toes): None, normal, Trunk Movements Neck, shoulders, hips: None, normal, Overall Severity Severity of abnormal movements (highest score from questions above): None, normal Incapacitation due to abnormal movements: None, normal Patient's awareness of abnormal movements (rate only patient's report): No Awareness, Dental Status Current problems with teeth and/or dentures?: No Does patient usually wear dentures?: No  CIWA:    COWS:     Treatment Plan Summary: Daily contact with patient to assess and evaluate symptoms and progress in treatment Medication management  Plan:  Will continue Abilify to 25 po qhs for psychosis. Will not go up on dose today -patient is very sensitive to medications and hence will increase doses slowly. Will continue gabapentin 200mg  BID dosing since patient does not want to be in bed all day. Will continue Lithium at 300 mg qHS,will not make any changes with that since she has prior side effects when it was increased. Will continue  ambien  10 mg po qhs. Will continue  vistaril prn for  anxiety. Will try to avoid ativan and related meds 2/2 to high fall risk as well as cognitive changes . CSW will work on disposition. Patient to be dc ed to supervised environment for a while . Patient to participate in therapeutic milieu. Will continue colace for constipation.       Medical Decision Making Problem Points:  Established problem, worsening (2) and Review of psycho-social stressors (1) Data Points:  Review of medication regiment & side effects (2)  I certify that inpatient services furnished can reasonably be expected to improve the patient's condition.   Glenwood Revoir ,MD 04/20/2014, 9:53 AM

## 2014-04-20 NOTE — Progress Notes (Signed)
Patient ID: [name], female   DOB: 1961-03-24, 53 y.o.   MRN: [id number] D: Client in bed eyes closed. A: Writer assessed for s/s of distress. Staff will monitor q53min for safety. R: Client is safe on the unit, respirations even, unlabored.

## 2014-04-20 NOTE — Progress Notes (Signed)
D. Pt has been up and visible in milieu this evening. Pt has been confrontational, argumentative and paranoid. Pt having difficult time with other patients in milieu as well as staff and accusing people of not being who they truly are and feels that Zacarias Pontes is out to get her. Pt ultimately refused all medications this evening, at one point requesting to take an [place] but then ultimately saying that she does not trust [place] or the medication and decided to refuse all. Pt has not been participating in various activities this evening. Pt spoke about discharge and reminded staff that she had put a 72 hour request in earlier today and said how she plans to speak with the doctor in the morning about it and stated that she has family that is concerned about her and concerned about her being here.  A. Support and encouragement provided, medication education given. R. Pt verbalized understanding, however still refused all medications, safety maintained.

## 2014-04-20 NOTE — Progress Notes (Addendum)
Patient ID: [name], female   DOB: [birth year], 53 y.o.   MRN: [id number] CSW left message for Pt's mother, Tiffane Sheldon, [phone], requesting that Mrs. Garriga return the call at the earliest convenience. CSW was contacting to request that Mrs. Craun visit Pt this weekend in order to be able to give her opinion on Pt's progress as discharge is approaching next week.   Peri Maris, LCSWA 04/20/2014 10:37 AM   Mrs. Bartoli returned the phone call.  Mrs. Happel reports that she believes the Pt is still psychotic as Pt calls nightly asking "Where are you? Are you safe?" and making other bizarre comments.  Mrs. Madruga reports that "she cannot handle Pt for a long time" at her house if she is still psychotic.  Mrs. Grzywacz is agreeable to visiting Pt this weekend.  CSW to follow-up.    Peri Maris, LCSWA 04/20/2014 10:54 AM

## 2014-04-20 NOTE — [facility] Group Notes (Signed)
[facility] Group Notes:  (Counselor/Nursing/MHT/Case Management/Adjunct)  04/20/2014 1:15PM  Type of Therapy:  Group Therapy  Participation Level: Invited  Chose to not attend  Summary of Progress/Problems: The topic for group was balance in life.  Pt participated in the discussion about when their life was in balance and out of balance and how this feels.  Pt discussed ways to get back in balance and short term goals they can work on to get where they want to be.    [name] 04/20/2014 2:58 PM

## 2014-04-20 NOTE — Progress Notes (Signed)
Patient ID: [name], female   DOB: August 23, 1960, 53 y.o.   MRN: [id number] D: Patient is disorganized and paranoid.  She exhibits attention seeking behavior from staff.  Her mood is irritable and labile.  She states, "I have a lawyer, you know.  I'm going to make all of you pay."  Patient asked MHT to take her BP which was slightly high.  Dr. Johnette Abraham asked nurse to take BP manually and patient refused stating, "Just don't bother.  I don't want it taken. Who said I had to have it taken, that doctor??"  Patient denies any SI/HI/AVH.  She is restless, coming in and out of her room constantly.  She has not attended group today.  She requested anxiety medication which she was given vistaril then proceeded to say her anxiety is a "2."  She also requested percocet for pain stating the ibuprofen was not working.  She has been c/o pain in her feet.  Patient was vigorously playing basketball during recreation time yesterday. Patient asked this nurse for her last name and informed patient that we do not give out last names.  She stated, "that's all right I have your first name to give to my lawyer."   A: patient was educated on methods of coping skills for anxiety.  Redirect patient as needed.  Safety checks completed every 15 minutes per protocol.   R: Patient needs continuous redirection.

## 2014-04-20 NOTE — [facility] Group Notes (Signed)
[facility] Group Notes:  (Nursing/MHT/Case Management/Adjunct)  Date:  04/20/2014  Time: 0900 am  Type of Therapy:  Psychoeducational Skills  Participation Level:  Did Not Attend   Zipporah Plants 04/20/2014, 2:47 PM

## 2014-04-21 DIAGNOSIS — E663 Overweight: Secondary | ICD-10-CM

## 2014-04-21 DIAGNOSIS — R350 Frequency of micturition: Secondary | ICD-10-CM

## 2014-04-21 DIAGNOSIS — R7309 Other abnormal glucose: Secondary | ICD-10-CM

## 2014-04-21 LAB — URINALYSIS W MICROSCOPIC (NOT AT ARMC)
BILIRUBIN URINE: NEGATIVE
Glucose, UA: NEGATIVE mg/dL
HGB URINE DIPSTICK: NEGATIVE
Ketones, ur: NEGATIVE mg/dL
Leukocytes, UA: NEGATIVE
Nitrite: NEGATIVE
PROTEIN: NEGATIVE mg/dL
Specific Gravity, Urine: 1.007 (ref 1.005–1.030)
Urine-Other: NONE SEEN
Urobilinogen, UA: 0.2 mg/dL (ref 0.0–1.0)
pH: 6 (ref 5.0–8.0)

## 2014-04-21 LAB — CBC
HEMATOCRIT: 40 % (ref 36.0–46.0)
Hemoglobin: 13.1 g/dL (ref 12.0–15.0)
MCH: 28.8 pg (ref 26.0–34.0)
MCHC: 32.8 g/dL (ref 30.0–36.0)
MCV: 87.9 fL (ref 78.0–100.0)
Platelets: 350 10*3/uL (ref 150–400)
RBC: 4.55 MIL/uL (ref 3.87–5.11)
RDW: 12.8 % (ref 11.5–15.5)
WBC: 9.4 10*3/uL (ref 4.0–10.5)

## 2014-04-21 LAB — BASIC METABOLIC PANEL
Anion gap: 14 (ref 5–15)
BUN: 12 mg/dL (ref 6–23)
CALCIUM: 10.3 mg/dL (ref 8.4–10.5)
CO2: 24 mEq/L (ref 19–32)
Chloride: 105 mEq/L (ref 96–112)
Creatinine, Ser: 0.65 mg/dL (ref 0.50–1.10)
Glucose, Bld: 141 mg/dL — ABNORMAL HIGH (ref 70–99)
POTASSIUM: 4.2 meq/L (ref 3.7–5.3)
Sodium: 143 mEq/L (ref 137–147)

## 2014-04-21 MED ORDER — ARIPIPRAZOLE 5 MG PO TABS
ORAL_TABLET | ORAL | Status: AC
  Filled 2014-04-21: qty 1

## 2014-04-21 MED ORDER — ARIPIPRAZOLE 5 MG PO TABS
5.0000 mg | ORAL_TABLET | Freq: Every day | ORAL | Status: DC
  Administered 2014-04-21 – 2014-04-22 (×2): 5 mg via ORAL
  Filled 2014-04-21 (×2): qty 1

## 2014-04-21 NOTE — Progress Notes (Signed)
Patient ID: [name], female   DOB: [birth year], 53 y.o.   MRN: [id number] Pt is scheduled for discharge on Saturday, to be transported by her mother, Davion Meara, at 1:00pm.  Peri Maris, [place] 04/21/2014 5:41 PM

## 2014-04-21 NOTE — [facility] Group Notes (Signed)
[facility] LCSW Group Therapy  04/21/2014  1:05 PM  Type of Therapy:  Group therapy  Participation Level:  Did not attend despite being invited.  Summary of Progress/Problems:  Chaplain was here to lead a group on themes of hope and courage.  [name] 04/21/2014 11:12 AM

## 2014-04-21 NOTE — Progress Notes (Signed)
NUTRITION ASSESSMENT  Pt identified as at risk on the Malnutrition Screen Tool  INTERVENTION: 1. Educated patient on the importance of nutrition and encouraged intake of food and beverages. 2. Discussed weight goals.  NUTRITION DIAGNOSIS: Unintentional weight loss related to sub-optimal intake as evidenced by pt report.   Goal: Pt to meet >/= 90% of their estimated nutrition needs.  Monitor:  PO intake  Assessment:  Patient admitted with psychotic disorder. Unable to obtain much history from patient. Her intake is fair, she has lost some weight (4-5% weight loss in 2-3 months).   53 y.o. female  Height: Ht Readings from Last 1 Encounters:  04/12/14 4\' 10"  (1.473 m)    Weight: Wt Readings from Last 1 Encounters:  04/12/14 142 lb (64.411 kg)    Weight Hx: Wt Readings from Last 10 Encounters:  04/12/14 142 lb (64.411 kg)  03/23/14 149 lb (67.586 kg)  02/27/14 149 lb (67.586 kg)  02/21/14 148 lb (67.132 kg)  02/13/14 150 lb (68.04 kg)  02/07/14 148 lb 1.6 oz (67.178 kg)  01/19/14 146 lb (66.225 kg)  12/19/13 145 lb (65.772 kg)  11/11/13 146 lb (66.225 kg)  11/07/13 146 lb (66.225 kg)    BMI:  Body mass index is 29.69 kg/(m^2). Pt meets criteria for overweight based on current BMI.  Estimated Nutritional Needs: Kcal: 25-30 kcal/kg Protein: > 1 gram protein/kg Fluid: 1 ml/kcal  Diet Order: General Pt is also offered choice of unit snacks mid-morning and mid-afternoon.  Pt is eating as desired.   Lab results and medications reviewed.   Larey Seat, RD, LDN Pager #: [phone] After-Hours Pager #: [phone]

## 2014-04-21 NOTE — Consult Note (Addendum)
[facility] Medical Consultation  [name] FGH:[id number] DOB: [birth year] DOA: 04/12/2014 PCP: [name], MD   Requesting physician: [name], psychiatry Date of consultation: 04/21/14 Reason for consultation: Urinary incontinence  Impression/Recommendations Active Problems:   Hyperglycemia: Patient had a random blood sugar 208 on 9/2. NO previous history of diabetes, although hyperglycemia has been noted in her past history. Check A1c. See below.    Urinary frequency: Urinalysis pending. Other possibility could be new-onset diabetes mellitus, A1c ordered. Noted to have elevated blood sugar. If patient's urine specimen is unrevealing and no signs of diabetes, in review of her medications, Abilify can cause urinary incontinence. Would not stop Abilify unless confirmed that she does not have a UTI or diabetes.    Psychotic disorder: Being managed by psychiatry.    Overweight (BMI 25.0-29.9): Patient. BMI greater than 25.  1.   I will followup again tomorrow. Please contact me if I can be of assistance in the meanwhile. Thank you for this consultation.  Chief Complaint: Urinary frequency  HPI:  53 year old female presented to the emergency room on 8/27 with disorganized and bizarre behavior with reported past medical history of questionable bipolar disorder who had been admitted to behavioral health for psychosis. In the last 24-36 hours, patient has felt urinary incontinence and has been urinating on herself in the bed. Hospitalists were called for medical consultation  Review of Systems:  Patient seen at behavioral health.  She denies any headaches or vision changes. She complains of feeling very short of breath but no cough. She states that she feels very constricted. However, when she states that she was told were open hallway she can breathe just fine. He denies any headaches, vision changes, dysphagia, chest pain or palpitations, abdominal pain, hematuria,  dysuria, constipation, diarrhea, focal extremity numbness weakness or pain. Patient does complain of urinary frequency and urinary incontinence, but she says that she does not have any infection  Past Medical History  Diagnosis Date  . Headache(784.0)   . IBS (irritable bowel syndrome)   . Bipolar 1 disorder     HX PSYCHOSIS  . Personality disorder     W/ BORDERLINE FEATURES  . History of duodenal ulcer   . Pelvic pain   . Complication of anesthesia     POST AGGITATION  . Frequency of urination   . Urgency of urination   . Nocturia    Past Surgical History  Procedure Laterality Date  . Laparoscopy N/A 12/14/2012    Procedure: LAPAROSCOPY DIAGNOSTIC;  Surgeon: Stark Klein, MD;  Location: [facility];  Service: General;  Laterality: N/A;  . Laparoscopic left salpingoophorectomy and lysys adhesions  03-10-2002  . Cardiovascular stress test  01-23-2011    NORMAL NUCLEAR STUDY/ NO ISCHEMIA/ EF 81%  . Transthoracic echocardiogram  10-13-2010    NORMAL LVF/  EF 55-60%  . Laparoscopic cholecystectomy  11-08-2001  . Cardiac catheterization  10-09-1999  DR KATZ    NORMAL LVF/  NORMAL RCA/  NO CRITICAL DISEASE LEFT CORONARY SYSTEM  . Laparoscopic removal ovary remnant  2003   [place]  . Total abdominal hysterectomy  2000    W/ RIGHT SALPINGOOPHORECTOMY  . Cystoscopy with biopsy N/A 06/03/2013    Procedure: CYSTOSCOPY WITH BIOPSY  INSTILLATION OF MARCAINE AND PYRIDIUM;  Surgeon: Hanley Ben, MD;  Location: [place];  Service: Urology;  Laterality: N/A;   Social History:  reports that she quit smoking about 4 years ago. Her smoking use included Cigarettes. She has a .01  pack-year smoking history. She has never used smokeless tobacco. She reports that she drinks about 1.8 ounces of alcohol per week. She reports that she does not use illicit drugs. patient states that she lives alone  Allergies  Allergen Reactions  . Morphine And Related Nausea And Vomiting   Family  History  Problem Relation Age of Onset  . Sleep apnea Father   . Migraines Sister     headaches  . Other Mother     MAC infection    Prior to Admission medications   Medication Sig Start Date End Date Taking? Authorizing Provider  lithium carbonate 300 MG capsule Take 300 mg by mouth at bedtime.    Historical Provider, MD  venlafaxine XR (EFFEXOR-XR) 75 MG 24 hr capsule Take 75 mg by mouth daily with breakfast.    Historical Provider, MD  zolpidem (AMBIEN) 10 MG tablet Take 10 mg by mouth at bedtime.    Historical Provider, MD   Physical Exam: Blood pressure 142/91, pulse 104, temperature 98.7 F (37.1 C), temperature source Oral, resp. rate 18, height 4\' 10"  (1.473 m), weight 64.411 kg (142 lb), SpO2 95.00%. Filed Vitals:   04/21/14 0626  BP: 142/91  Pulse: 104  Temp:   Resp:      General:  Alert and oriented x2. See below.  Eyes: Sclera nonicteric, extraocular movements are intact  ENT: Normocephalic, atraumatic, mucous membranes are moist  Neck: No JVD  Cardiovascular: Regular rate and rhythm, S1-S2  Respiratory: Clear to auscultation bilaterally  Abdomen: Soft, nontender, nondistended, positive bowel sounds  Skin: No skin breaks, tears or lesions  Musculoskeletal: No clubbing or cyanosis or edema  Psychiatric: Patient still has some degree of psychosis. She speaks in very paranoid terms and also has grandiose fantasies  Neurologic: No focal deficits  Labs on Admission:  CBG:  Recent Labs Lab 04/19/14 1812  GLUCAP 208*    Time spent: 35 minutes  [facility] Pager [phone]  If 7PM-7AM, please contact night-coverage [email or web address] Password [email or web address] 04/21/2014, 5:52 PM

## 2014-04-21 NOTE — Progress Notes (Addendum)
Pt is pleasant and cooperative this am. Pt at times can be paranoid taking her medications and will question the color of meds and then decide if she will take them or not. Pt stated,"people are calling me spike and I do not know why." pt states her depression is a 2/10 and her hopelessness is a 2/10. She c/o foot pain from running on the gym floors yesterday. Pt was given volatren to put on her feet.Pt has been in the mileu with the other pts. She does contract for safety and denies Si and HI.Pt keeps going into her roommates belongings. MD made aware and pt was made a do not admit.Pt would only take her noon meds of she was permitted to open the pill packaging.

## 2014-04-21 NOTE — Progress Notes (Signed)
Patient ID: [name], female   DOB: 10/30/1960, 53 y.o.   MRN: [id number] [facility] MD Progress Note  04/21/2014 3:32 PM CHRISTYANN MANOLIS  MRN:  [id number] Subjective: " I feel better today"  Objective : Patient seen and chart reviewed. Patient continues to be delusional . She has periods when she is pleasant and cooperative and other times when she is irritable and very attentions seeking. Patient continues to be somatic ,but she is at baseline . Denies SI/AH/VH/HI. Per staff  patient continues to be disorganized at times .Patient has been having some urinary incontinence. She has been refusing some of her meds .Patient has been adding salt in her water and hence will check her [name] level.     Diagnosis:   DSM5: Primary psychiatric diagnosis:  Schizoaffective disorder,bipolar type,multiple episodes ,currently in acute episode   Secondary psychiatric diagnosis:  Somatic symptom disorder,persistent,moderate   Non psychiatric diagnosis:  Hx of generalized weakness (all work up wnl)  IBS  Hx of duodenal ulcer  Hx of frequency of urination      ADL's:  Intact  Sleep: Fair  Appetite:  Fair  Suicidal Ideation:  Refused to answe Homicidal Ideation:  denies AEB (as evidenced by):  Psychiatric Specialty Exam: Physical Exam  Constitutional: She appears well-developed and well-nourished.  Psychiatric: Her speech is normal. Her affect is inappropriate. Thought content is paranoid and delusional. Cognition and memory are normal. She expresses inappropriate judgment. She is inattentive.    Review of Systems  Genitourinary: Positive for urgency and frequency.  Psychiatric/Behavioral: Positive for hallucinations (delusional). Negative for suicidal ideas. The patient is nervous/anxious.     Blood pressure 142/91, pulse 104, temperature 98.7 F (37.1 C), temperature source Oral, resp. rate 18, height 4\' 10"  (1.473 m), weight 64.411 kg (142 lb), SpO2 95.00%.Body mass index is 29.69  kg/(m^2).  General Appearance: Casual  Eye Contact::  Fair  Speech:  Normal Rate  Volume:  Normal  Mood:  Euthymic  Affect:  Inappropriate  Thought Process:  Loose and Tangential  Orientation:  Full (Time, Place, and Person)  Thought Content:  Delusions, Ideas of Reference:   Paranoia Delusions and Paranoid Ideation - she has special powers.  Suicidal Thoughts: denies  Homicidal Thoughts: denies  Memory:  Negative  Judgement:  Impaired  Insight:  Lacking  Psychomotor Activity:  Normal  Concentration:  Poor  Recall:  Poor  Fund of Knowledge:Poor  Language: Fair  Akathisia:  No  Handed: right  AIMS (if indicated):   0  Assets:  Communication Skills  Sleep:  Number of Hours: 2.5   Musculoskeletal: Strength & Muscle Tone: within normal limits Gait & Station: normal Patient leans: N/A  Current Medications: Current Facility-Administered Medications  Medication Dose Route Frequency Provider Last Rate Last Dose  . alum & mag hydroxide-simeth (MAALOX/MYLANTA) 200-200-20 MG/5ML suspension 30 mL  30 mL Oral Q4H PRN Clarene Reamer, MD      . ARIPiprazole (ABILIFY) tablet 25 mg  25 mg Oral QHS Toivo Bordon, MD      . ARIPiprazole (ABILIFY) tablet 5 mg  5 mg Oral Daily Ursula Alert, MD   5 mg at 04/21/14 1121  . diclofenac sodium (VOLTAREN) 1 % transdermal gel 4 g  4 g Topical QID Ursula Alert, MD   4 g at 04/21/14 1125  . docusate sodium (COLACE) capsule 100 mg  100 mg Oral BID Ursula Alert, MD   100 mg at 04/21/14 0815  . gabapentin (NEURONTIN) capsule 200 mg  200 mg Oral BID Ursula Alert, MD   200 mg at 04/21/14 0815  . hydrOXYzine (ATARAX/VISTARIL) tablet 50 mg  50 mg Oral TID PRN Ursula Alert, MD   50 mg at 04/20/14 0959  . ibuprofen (ADVIL,MOTRIN) tablet 600 mg  600 mg Oral Q4H PRN Ursula Alert, MD   600 mg at 04/21/14 0401  . lithium carbonate capsule 300 mg  300 mg Oral QHS Clarene Reamer, MD   300 mg at 04/19/14 2128  . magnesium hydroxide (MILK OF MAGNESIA)  suspension 30 mL  30 mL Oral Daily PRN Clarene Reamer, MD   30 mL at 04/17/14 0820  . OLANZapine (ZYPREXA) injection 5 mg  5 mg Intramuscular Once Charlcie Cradle, MD      . ondansetron [facility]) tablet 4 mg  4 mg Oral Q8H PRN Clarene Reamer, MD      . zolpidem Lorrin Mais) tablet 10 mg  10 mg Oral QHS Laverle Hobby, PA-C   10 mg at 04/19/14 2128    Lab Results:  Results for orders placed during the hospital encounter of 04/12/14 (from the past 48 hour(s))  GLUCOSE, CAPILLARY     Status: Abnormal   Collection Time    04/19/14  6:12 PM      Result Value Ref Range   Glucose-Capillary 208 (*) 70 - 99 mg/dL   Comment 1 Notify RN      Physical Findings: AIMS: Facial and Oral Movements Muscles of Facial Expression: None, normal Lips and Perioral Area: None, normal Jaw: None, normal Tongue: None, normal,Extremity Movements Upper (arms, wrists, hands, fingers): None, normal Lower (legs, knees, ankles, toes): None, normal, Trunk Movements Neck, shoulders, hips: None, normal, Overall Severity Severity of abnormal movements (highest score from questions above): None, normal Incapacitation due to abnormal movements: None, normal Patient's awareness of abnormal movements (rate only patient's report): No Awareness, Dental Status Current problems with teeth and/or dentures?: No Does patient usually wear dentures?: No  CIWA:    COWS:     Treatment Plan Summary: Daily contact with patient to assess and evaluate symptoms and progress in treatment Medication management  Plan:  Will increase Abilify to 30 mg po daily.Patient is very sensitive to medications and will be cautious about giving IM as well dose increase. Will continue gabapentin 200mg  BID dosing since patient does not want to be in bed all day. Will continue Lithium at 300 mg qHS,will not make any changes with that since she has prior side effects when it was increased.Patient per report has been adding salt in her water and hence will  get a Li level for AM. Will continue  ambien  10 mg po qhs. Will continue  vistaril prn for anxiety. Will try to avoid ativan and related meds 2/2 to high fall risk as well as cognitive changes . CSW will work on disposition. Patient to be dc ed to supervised environment for a while . Patient to participate in therapeutic milieu. Will continue colace for constipation. Will place Hospitalist consult for urinary incontinence. Will check UA,will get Li level in AM. If toxic level ,could DC Lithium. Patient has signed a 72 hr and can be discharged over the weekend if she is safe to be discharged.       Medical Decision Making Problem Points:  Established problem, worsening (2) and Review of psycho-social stressors (1) Data Points:  Review of medication regiment & side effects (2)  I certify that inpatient services furnished can reasonably be expected to  improve the patient's condition.   Kristalynn Coddington ,MD 04/21/2014, 3:32 PM

## 2014-04-21 NOTE — [facility] Group Notes (Signed)
[facility] LCSW Aftercare Discharge Planning Group Note   04/21/2014 10:46 AM  Participation Quality:  Did not attend    [name]

## 2014-04-21 NOTE — [facility] Group Notes (Signed)
Adult Psychoeducational Group Note  Date:  04/21/2014 Time:  800   Group Topic/Focus:  Wrap-Up Group:   The focus of this group is to help patients review their daily goal of treatment and discuss progress on daily workbooks.  Participation Level:  Did Not Attend  Participation Quality:  DID NOT ATTEND  Affect:  DID NOT ATTEND  Cognitive:  DID NOT ATTEND  Insight: None  Engagement in Group:  DID NOT ATTEND  Modes of Intervention:  DID NOT ATTEND  Additional Comments: DID NOT ATTEND  Kathrene Alu 04/21/2014,800

## 2014-04-22 LAB — HEMOGLOBIN A1C
HEMOGLOBIN A1C: 6 % — AB (ref <5.7)
Mean Plasma Glucose: 126 mg/dL — ABNORMAL HIGH (ref <117)

## 2014-04-22 LAB — LITHIUM LEVEL: Lithium Lvl: 0.27 mEq/L — ABNORMAL LOW (ref 0.80–1.40)

## 2014-04-22 MED ORDER — LITHIUM CARBONATE 300 MG PO CAPS: 300.0000 mg | ORAL_CAPSULE | Freq: Every day | ORAL | Status: DC

## 2014-04-22 MED ORDER — ZOLPIDEM TARTRATE 10 MG PO TABS: 10.0000 mg | ORAL_TABLET | Freq: Every day | ORAL | Status: DC

## 2014-04-22 MED ORDER — ARIPIPRAZOLE 5 MG PO TABS: 25.0000 mg | ORAL_TABLET | Freq: Every day | ORAL | Status: DC

## 2014-04-22 MED ORDER — ARIPIPRAZOLE 5 MG PO TABS
25.0000 mg | ORAL_TABLET | Freq: Every day | ORAL | Status: DC
  Filled 2014-04-22 (×2): qty 15

## 2014-04-22 MED ORDER — BENZTROPINE MESYLATE 0.5 MG PO TABS
0.5000 mg | ORAL_TABLET | Freq: Two times a day (BID) | ORAL | Status: DC
  Administered 2014-04-22 – 2014-04-24 (×4): 0.5 mg via ORAL
  Filled 2014-04-22 (×2): qty 1
  Filled 2014-04-22 (×2): qty 8
  Filled 2014-04-22 (×6): qty 1

## 2014-04-22 MED ORDER — NAPROXEN 500 MG PO TABS
ORAL_TABLET | ORAL | Status: AC
  Filled 2014-04-22: qty 1

## 2014-04-22 MED ORDER — NAPROXEN 500 MG PO TABS
500.0000 mg | ORAL_TABLET | Freq: Two times a day (BID) | ORAL | Status: DC | PRN
  Administered 2014-04-22 – 2014-04-24 (×5): 500 mg via ORAL
  Filled 2014-04-22 (×4): qty 1

## 2014-04-22 MED ORDER — ARIPIPRAZOLE 15 MG PO TABS
15.0000 mg | ORAL_TABLET | Freq: Every day | ORAL | Status: DC
  Filled 2014-04-22 (×2): qty 1
  Filled 2014-04-22: qty 4
  Filled 2014-04-22 (×2): qty 1

## 2014-04-22 MED ORDER — GABAPENTIN 100 MG PO CAPS: 200.0000 mg | ORAL_CAPSULE | Freq: Two times a day (BID) | ORAL | Status: DC

## 2014-04-22 NOTE — Progress Notes (Signed)
D: Patient denies suicidal/homicidal ideations and hallucinations. The patient is positive for paranoia and delusions. The patient is hyperactive and her thought process is tangential. The patient has rescinded her 72 hour request for discharge today. She states that she "fears for her life" due to terrorism "from the inside and from the outside." The patient is isolating to her room and is having minimal interaction within the milieu.  A: Patient given emotional support from RN. Patient encouraged to come to staff with concerns and/or questions. Patient's medication routine continued. Patient's orders and plan of care reviewed.  R: Patient remains safe. Will continue to monitor patient q15 minutes for safety.

## 2014-04-22 NOTE — B[facility] Group Notes (Signed)
[facility] Group Notes:  (Clinical Social Work)  04/22/2014  11:15-12:00PM  Summary of Progress/Problems:   The main focus of today's process group was to discuss patients' feelings about hospitalization, the stigma attached to mental health, and sources of motivation to stay well.  We then worked to identify a specific plan to avoid future hospitalizations when discharged from the hospital for this admission.  The patient expressed that she feels that she needs to be in the hospital.  She had been late to group so it took a few moments for her to catch on to the group topic, but she contributed appropriately until at one point she burst into tears.  She had a hard time gathering herself, but did so eventually, and smiled again, became attentive to group again.    Type of Therapy:  Group Therapy - Process  Participation Level:  Active  Participation Quality:  Sharing  Affect:  Labile  Cognitive:  Alert  Insight:  Improving  Engagement in Therapy:  Engaged  Modes of Intervention:  Exploration, Discussion  Selmer Dominion, LCSW 04/22/2014, 12:28 PM

## 2014-04-22 NOTE — Progress Notes (Signed)
Psychoeducational Group Note  Date:  04/22/2014 Time:  0900  Group Topic/Focus:  Making Healthy Choices:   The focus of this group is to help patients identify negative/unhealthy choices they were using prior to admission and identify positive/healthier coping strategies to replace them upon discharge.  Participation Level: Did Not Attend  Participation Quality:  Not Applicable  Affect:  Not Applicable  Cognitive:  Not Applicable  Insight:  Not Applicable  Engagement in Group: Not Applicable  Additional Comments:  Did not attend.  Clinton Sawyer MCCOLLUM 04/22/2014, 9:50 AM

## 2014-04-22 NOTE — Discharge Summary (Signed)
Physician Discharge Summary Note  Patient:  [name] is an 53 y.o., female MRN:  [id number] DOB:  [birth year] Patient phone:  [phone] (home)  Patient address:   [address] [place] [address],  Total Time spent with patient: 30 minutes  Date of Admission:  04/12/2014 Date of Discharge:  04/22/2014   Reason for Admission:  psychosis Discharge Diagnoses: Active Problems:   Hyperglycemia   Urinary frequency   Psychotic disorder   Overweight (BMI 25.0-29.9)  Primary psychiatric diagnosis:  Schizoaffective disorder,bipolar type,multiple episodes ,currently in acute episode  Secondary psychiatric diagnosis:  R/O Somatic symptom disorder,persistent,moderate   Psychiatric Specialty Exam:  Please see D/C SRA  Physical Exam  ROS  Blood pressure 132/84, pulse 103, temperature 98.6 F (37 C), temperature source Oral, resp. rate 20, height '4\' 10"'  (1.473 m), weight 64.411 kg (142 lb), SpO2 95.00%.Body mass index is 29.69 kg/(m^2).     Past Psychiatric History:  Diagnosis:Bipolar disorder   Hospitalizations:[facility], [facility]   Outpatient Care:[facility][name] [facility]   Substance Abuse Care:reports going to [employer] meetings since she used to abuse ETOH in the past   Self-Mutilation:denies   Suicidal Attempts:denies   Violent Behaviors:denies    DSM5:  Schizophrenia Disorders:  Schizoaffective disorder, bipolar type, Obsessive-Compulsive Disorders:   Trauma-Stressor Disorders:   Substance/Addictive Disorders:   Depressive Disorders:  Somatic disorder  Axis Diagnosis:   AXIS I:  Bipolar disorder schizoaffective type, somatic disorder, psychosis AXIS II:  Deferred AXIS III:   Past Medical History  Diagnosis Date  . Headache(784.0)   . IBS (irritable bowel syndrome)   . Bipolar 1 disorder     HX PSYCHOSIS  . Personality disorder     W/ BORDERLINE FEATURES  . History of duodenal ulcer   . Pelvic pain   . Complication of anesthesia     POST  AGGITATION  . Frequency of urination   . Urgency of urination   . Nocturia    AXIS IV:  problems related to social environment AXIS V:  51-60 moderate symptoms  Level of Care:  OP  Hospital Course:             Patient is a 53 year old CF ,who presented very disorganized with bizzare behavior to the ED . Patient is divorced, unemployed, lives by self in [place], and has Past hx of bipolar disorder. Patient reports that she feels very slowed down and week and that her psychiatrist was trying to reduce her medication Lithium in order to help her. She reports she does not have any mood swing ,but does have a lot of thought racing. She reports that she feels that the TV is making reference to her as well as that there is an outside force that controls her. She denies any AH/VH ,but reports delusions of having the ability to sense things and as having premonitions as well as intuitions. She also reports that she has the ability to read people's mind. She reports having passive SI but without a plan.              [name] was admitted to the adult unit where she was evaluated and her symptoms were identified. Medication management was discussed and implemented. She was encouraged to participate in unit programming. Medical problems were identified and treated appropriately. Home medication was restarted as needed.  She was evaluated each day by a clinical provider to ascertain the patient's response to treatment.  Improvement was noted by the patient's report of decreasing symptoms, improved sleep and appetite, affect, medication tolerance, behavior, and participation in unit programming.  The patient was asked each day to complete a self inventory noting mood, mental status, pain, new symptoms, anxiety and concerns.            She responded well to medication and being in a therapeutic and supportive environment. Positive and appropriate behavior was noted and the patient  was motivated for recovery.  She worked closely with the treatment team and case manager to develop a discharge plan with appropriate goals. Coping skills, problem solving as well as relaxation therapies were also part of the unit programming.            By the day of discharge she was in much improved condition than upon admission.  Symptoms were reported as significantly decreased or resolved completely.  The patient denied SI/HI and voiced no AVH. She was motivated to continue taking medication with a goal of continued improvement in mental health.          [name] was discharged home with a plan to follow up as noted below.  Consults:  [facility]  Significant Diagnostic Studies:  CBC w diff, CMP, UDS, UA, BAL therapeutic drug level  Discharge Vitals:   Blood pressure 132/84, pulse 103, temperature 98.6 F (37 C), temperature source Oral, resp. rate 20, height '4\' 10"'  (1.473 m), weight 64.411 kg (142 lb), SpO2 95.00%. Body mass index is 29.69 kg/(m^2). Lab Results:   Results for orders placed during the hospital encounter of 04/12/14 (from the past 72 hour(s))  GLUCOSE, CAPILLARY     Status: Abnormal   Collection Time    04/19/14  6:12 PM      Result Value Ref Range   Glucose-Capillary 208 (*) 70 - 99 mg/dL   Comment 1 Notify RN    URINALYSIS W MICROSCOPIC     Status: None   Collection Time    04/21/14  3:45 PM      Result Value Ref Range   Color, Urine YELLOW  YELLOW   APPearance CLEAR  CLEAR   Specific Gravity, Urine 1.007  1.005 - 1.030   pH 6.0  5.0 - 8.0   Glucose, UA NEGATIVE  NEGATIVE mg/dL   Hgb urine dipstick NEGATIVE  NEGATIVE   Bilirubin Urine NEGATIVE  NEGATIVE   Ketones, ur NEGATIVE  NEGATIVE mg/dL   Protein, ur NEGATIVE  NEGATIVE mg/dL   Urobilinogen, UA 0.2  0.0 - 1.0 mg/dL   Nitrite NEGATIVE  NEGATIVE   Leukocytes, UA NEGATIVE  NEGATIVE   Urine-Other       Value: NO FORMED ELEMENTS SEEN ON URINE MICROSCOPIC EXAMINATION   Comment: Performed at  [facility]  CBC     Status: None   Collection Time    04/21/14  7:38 PM      Result Value Ref Range   WBC 9.4  4.0 - 10.5 K/uL   RBC 4.55  3.87 - 5.11 MIL/uL   Hemoglobin 13.1  12.0 - 15.0 g/dL   HCT 40.0  36.0 - 46.0 %   MCV 87.9  78.0 - 100.0 fL   MCH 28.8  26.0 - 34.0 pg   MCHC 32.8  30.0 - 36.0 g/dL   RDW 12.8  11.5 - 15.5 %   Platelets 350  150 -  400 K/uL   Comment: Performed at [facility] PANEL     Status: Abnormal   Collection Time    04/21/14  7:38 PM      Result Value Ref Range   Sodium 143  137 - 147 mEq/L   Potassium 4.2  3.7 - 5.3 mEq/L   Chloride 105  96 - 112 mEq/L   CO2 24  19 - 32 mEq/L   Glucose, Bld 141 (*) 70 - 99 mg/dL   BUN 12  6 - 23 mg/dL   Creatinine, Ser 0.65  0.50 - 1.10 mg/dL   Calcium 10.3  8.4 - 10.5 mg/dL   GFR calc non Af Amer >90  >90 mL/min   GFR calc Af Amer >90  >90 mL/min   Comment: (NOTE)     The eGFR has been calculated using the CKD EPI equation.     This calculation has not been validated in all clinical situations.     eGFR's persistently <90 mL/min signify possible Chronic Kidney     Disease.   Anion gap 14  5 - 15   Comment: Performed at [facility]     Status: Abnormal   Collection Time    04/22/14  6:14 AM      Result Value Ref Range   Lithium Lvl 0.27 (*) 0.80 - 1.40 mEq/L   Comment: Performed at [facility]    Physical Findings: AIMS: Facial and Oral Movements Muscles of Facial Expression: None, normal Lips and Perioral Area: None, normal Jaw: None, normal Tongue: None, normal,Extremity Movements Upper (arms, wrists, hands, fingers): None, normal Lower (legs, knees, ankles, toes): None, normal, Trunk Movements Neck, shoulders, hips: None, normal, Overall Severity Severity of abnormal movements (highest score from questions above): None, normal Incapacitation due to abnormal movements: None, normal Patient's  awareness of abnormal movements (rate only patient's report): No Awareness, Dental Status Current problems with teeth and/or dentures?: No Does patient usually wear dentures?: No  CIWA:    COWS:     Psychiatric Specialty Exam: See Psychiatric Specialty Exam and Suicide Risk Assessment completed by Attending Physician prior to discharge.  Discharge destination:  Home  Is patient on multiple antipsychotic therapies at discharge:  No   Has Patient had three or more failed trials of antipsychotic monotherapy by history:  No  Recommended Plan for Multiple Antipsychotic Therapies: NA  Discharge Instructions   Diet - low sodium heart healthy    Complete by:  As directed      Discharge instructions    Complete by:  As directed   Take all of your medications as directed. Be sure to keep all of your follow up appointments.  If you are unable to keep your follow up appointment, call your Doctor's office to let them know, and reschedule.  Make sure that you have enough medication to last until your appointment. Be sure to get plenty of rest. Going to bed at the same time each night will help. Try to avoid sleeping during the day.  Increase your activity as tolerated. Regular exercise will help you to sleep better and improve your mental health. Eating a heart healthy diet is recommended. Try to avoid salty or fried foods. Be sure to avoid all alcohol and illegal drugs.     Increase activity slowly    Complete by:  As directed             Medication List  STOP taking these medications       venlafaxine XR 75 MG 24 hr capsule  Commonly known as:  EFFEXOR-XR      TAKE these medications     Indication   ARIPiprazole 5 MG tablet  Commonly known as:  ABILIFY  Take 5 tablets (25 mg total) by mouth at bedtime.   Indication:  psychosis     gabapentin 100 MG capsule  Commonly known as:  NEURONTIN  Take 2 capsules (200 mg total) by mouth 2 (two) times daily.   Indication:  Agitation      lithium carbonate 300 MG capsule  Take 1 capsule (300 mg total) by mouth at bedtime.   Indication:  Manic-Depression     zolpidem 10 MG tablet  Commonly known as:  AMBIEN  Take 1 tablet (10 mg total) by mouth at bedtime.   Indication:  Trouble Sleeping           Follow-up Information   Follow up with [facility], P.A. On 06/08/2014. (for medication management.  You may call on a daily basis to see if you can be fit in due to a cancellation.  In the event that your medication supply is going to run out, you may request a refill through the nubmer listed above. )    Contact information:   [place] [address] [phone]      Follow-up recommendations:   Activities: Resume activity as tolerated. Diet: Heart healthy low sodium diet Tests: Follow up testing will be determined by your out patient provider. Comments:   Will need out patient follow up for continued use of lithium. Current level is sub therapeutic 0.27 Total Discharge Time:  Greater than 30 minutes.  Signed:Neil T. Mashburn RPAC 10:11 AM 04/22/2014   Patient seen, evaluated and I agree with notes by Nurse Practitioner. Corena Pilgrim, MD

## 2014-04-22 NOTE — Progress Notes (Addendum)
    Followup note:  Consulted for urinary incontinence. Urinalysis unrevealing. CBG seem lower today, A1c pending, but I do not feel that this is from severe hyperglycemia causing diuresis. Given infection and diabetes likely not as causes, feel that because of her urinary incontinence is from new medication: Abilify. Understanding is that the patient is to be discharged today. I spoke with Nena Polio, psychiatry PA taking care of the patient. Plan is for patient's discharge to be canceled. Should my concerns about her Abilify and alternate medication may be considered. I did stress however that the degree of her urinary incontinence was not immediately apparent to me and if the benefits outweigh the disadvantages, and there's not a better medication, Abilify may be able to be continued. We'll sign off. If we are needed, please do not hesitate to call.  Pager:[phone] [facility] 24 hour line: [phone]

## 2014-04-22 NOTE — Progress Notes (Signed)
Patient handed RN a scrap of paper with the words "Call the [facility] STAT or [employer]." Patient believes "terrorists are out to get her." Patient remains safe and is cooperative with staff at this time.

## 2014-04-22 NOTE — Progress Notes (Signed)
Patient ID: [name], female   DOB: 03/12/1961, 53 y.o.   MRN: [id number] [facility] MD Progress Note  04/22/2014 11:49 AM EMILEY DIGIACOMO  MRN:  [id number] Subjective: " I cannot go home today. I don't feel safe outside and I feel like I am being attacked from inside by terrorists.''  Objective : Patient seen and her chart was reviewed. Patient remains delusional with the believe that one of the patients is planted on [address] to monitor her and she also believes terrorists are attacking her. She is requesting to stay in the hospital for few more days and agreed to rescind her 72 hours paper. Patient is evasive, easily irritable and  self isolated in her room with minimal interaction with her peers and staffs. She denies suicidal or homicidal ideations, intent or plan. She continues to have urinary frequency and has been seen by the hospitalist who recommended that her medications should be reviewed. Lithium level is 0.27   Diagnosis:   DSM5: Primary psychiatric diagnosis:  Schizoaffective disorder,bipolar type,multiple episodes ,currently in acute episode   Secondary psychiatric diagnosis:  Somatic symptom disorder,persistent,moderate   Non psychiatric diagnosis:  Hx of generalized weakness (all work up wnl)  IBS  Hx of duodenal ulcer  Hx of frequency of urination      ADL's:  Intact  Sleep: Fair  Appetite:  Fair  Suicidal Ideation:  Refused to answe Homicidal Ideation:  denies AEB (as evidenced by):  Psychiatric Specialty Exam: Physical Exam  Constitutional: She appears well-developed and well-nourished.  Psychiatric: Her speech is normal. Her affect is inappropriate. Thought content is paranoid and delusional. Cognition and memory are normal. She expresses inappropriate judgment. She is inattentive.    Review of Systems  Genitourinary: Positive for urgency and frequency.  Psychiatric/Behavioral: Positive for hallucinations (delusional). Negative for suicidal ideas.  The patient is nervous/anxious.     Blood pressure 132/84, pulse 103, temperature 98.6 F (37 C), temperature source Oral, resp. rate 20, height _0  (1.473 m), weight 64.411 kg (142 lb), SpO2 95.00%.Body mass index is 29.69 kg/(m^2).  General Appearance: Casual  Eye Contact::  Fair  Speech:  Normal Rate  Volume:  Normal  Mood:  Euthymic  Affect:  Inappropriate  Thought Process:  Loose and Tangential  Orientation:  Full (Time, Place, and Person)  Thought Content:  Delusions, Ideas of Reference:   Paranoia Delusions and Paranoid Ideation - she has special powers.  Suicidal Thoughts: denies  Homicidal Thoughts: denies  Memory:  Negative  Judgement:  Impaired  Insight:  Lacking  Psychomotor Activity:  Normal  Concentration:  Poor  Recall:  Poor  Fund of Knowledge:Poor  Language: Fair  Akathisia:  No  Handed: right  AIMS (if indicated):   0  Assets:  Communication Skills  Sleep:  Number of Hours: 1.75   Musculoskeletal: Strength & Muscle Tone: within normal limits Gait & Station: normal Patient leans: N/A  Current Medications: Current Facility-Administered Medications  Medication Dose Route Frequency Provider Last Rate Last Dose  . alum & mag hydroxide-simeth (MAALOX/MYLANTA) 200-200-20 MG/5ML suspension 30 mL  30 mL Oral Q4H PRN Clarene Reamer, MD      . ARIPiprazole (ABILIFY) tablet 15 mg  15 mg Oral QHS Corry Ihnen      . benztropine (COGENTIN) tablet 0.5 mg  0.5 mg Oral BID Alaja Goldinger      . diclofenac sodium (VOLTAREN) 1 % transdermal gel 4 g  4 g Topical QID Ursula Alert, MD   4  g at 04/22/14 0743  . docusate sodium (COLACE) capsule 100 mg  100 mg Oral BID Ursula Alert, MD   100 mg at 04/21/14 0815  . gabapentin (NEURONTIN) capsule 200 mg  200 mg Oral BID Ursula Alert, MD   200 mg at 04/22/14 0742  . hydrOXYzine (ATARAX/VISTARIL) tablet 50 mg  50 mg Oral TID PRN Ursula Alert, MD   50 mg at 04/20/14 0959  . ibuprofen (ADVIL,MOTRIN) tablet 600 mg  600  mg Oral Q4H PRN Ursula Alert, MD   600 mg at 04/22/14 0958  . magnesium hydroxide (MILK OF MAGNESIA) suspension 30 mL  30 mL Oral Daily PRN Clarene Reamer, MD   30 mL at 04/17/14 0820  . OLANZapine (ZYPREXA) injection 5 mg  5 mg Intramuscular Once Charlcie Cradle, MD      . ondansetron [facility]) tablet 4 mg  4 mg Oral Q8H PRN Clarene Reamer, MD      . zolpidem [facility]) tablet 10 mg  10 mg Oral QHS Laverle Hobby, PA-C   10 mg at 04/19/14 2128    Lab Results:  Results for orders placed during the hospital encounter of 04/12/14 (from the past 48 hour(s))  URINALYSIS W MICROSCOPIC     Status: None   Collection Time    04/21/14  3:45 PM      Result Value Ref Range   Color, Urine YELLOW  YELLOW   APPearance CLEAR  CLEAR   Specific Gravity, Urine 1.007  1.005 - 1.030   pH 6.0  5.0 - 8.0   Glucose, UA NEGATIVE  NEGATIVE mg/dL   Hgb urine dipstick NEGATIVE  NEGATIVE   Bilirubin Urine NEGATIVE  NEGATIVE   Ketones, ur NEGATIVE  NEGATIVE mg/dL   Protein, ur NEGATIVE  NEGATIVE mg/dL   Urobilinogen, UA 0.2  0.0 - 1.0 mg/dL   Nitrite NEGATIVE  NEGATIVE   Leukocytes, UA NEGATIVE  NEGATIVE   Urine-Other       Value: NO FORMED ELEMENTS SEEN ON URINE MICROSCOPIC EXAMINATION   Comment: Performed at [facility]  CBC     Status: None   Collection Time    04/21/14  7:38 PM      Result Value Ref Range   WBC 9.4  4.0 - 10.5 K/uL   RBC 4.55  3.87 - 5.11 MIL/uL   Hemoglobin 13.1  12.0 - 15.0 g/dL   HCT 40.0  36.0 - 46.0 %   MCV 87.9  78.0 - 100.0 fL   MCH 28.8  26.0 - 34.0 pg   MCHC 32.8  30.0 - 36.0 g/dL   RDW 12.8  11.5 - 15.5 %   Platelets 350  150 - 400 K/uL   Comment: Performed at [place] PANEL     Status: Abnormal   Collection Time    04/21/14  7:38 PM      Result Value Ref Range   Sodium 143  137 - 147 mEq/L   Potassium 4.2  3.7 - 5.3 mEq/L   Chloride 105  96 - 112 mEq/L   CO2 24  19 - 32 mEq/L   Glucose, Bld 141 (*) 70 - 99  mg/dL   BUN 12  6 - 23 mg/dL   Creatinine, Ser 0.65  0.50 - 1.10 mg/dL   Calcium 10.3  8.4 - 10.5 mg/dL   GFR calc non Af Amer >90  >90 mL/min   GFR calc Af Amer >90  >90  mL/min   Comment: (NOTE)     The eGFR has been calculated using the CKD EPI equation.     This calculation has not been validated in all clinical situations.     eGFR's persistently <90 mL/min signify possible Chronic Kidney     Disease.   Anion gap 14  5 - 15   Comment: Performed at [place]     Status: Abnormal   Collection Time    04/22/14  6:14 AM      Result Value Ref Range   Lithium Lvl 0.27 (*) 0.80 - 1.40 mEq/L   Comment: Performed at [facility]    Physical Findings: AIMS: Facial and Oral Movements Muscles of Facial Expression: None, normal Lips and Perioral Area: None, normal Jaw: None, normal Tongue: None, normal,Extremity Movements Upper (arms, wrists, hands, fingers): None, normal Lower (legs, knees, ankles, toes): None, normal, Trunk Movements Neck, shoulders, hips: None, normal, Overall Severity Severity of abnormal movements (highest score from questions above): None, normal Incapacitation due to abnormal movements: None, normal Patient's awareness of abnormal movements (rate only patient's report): No Awareness, Dental Status Current problems with teeth and/or dentures?: No Does patient usually wear dentures?: No  CIWA:    COWS:     Treatment Plan Summary: Daily contact with patient to assess and evaluate symptoms and progress in treatment Medication management  Plan:  Will increase Abilify to 30 mg po daily.Patient is very sensitive to medications and will be cautious about giving IM as well dose increase. Will continue gabapentin 286m BID dosing since patient does not want to be in bed all day. Discontinue Lithium at 300 mg qHS- due to urinary frequency/incontinuence. Change Abilify to 133mpo BID for mood  stabilization/delusions Initiate Cogentin 0.69m78mo bid for EPS prevention. Will continue  ambien  10 mg po qhs. Will continue  vistaril prn for anxiety. Will try to avoid ativan and related meds 2/2 to high fall risk as well as cognitive changes . CSW will work on disposition. Patient to participate in therapeutic milieu. Will continue colace for constipation. Cancel today's discharge due to patient's worsening symptoms.       Medical Decision Making Problem Points:  Established problem, worsening (2) and Review of psycho-social stressors (1) Data Points:  Review of medication regiment & side effects (2)  I certify that inpatient services furnished can reasonably be expected to improve the patient's condition.   AkiCorena PilgrimD 04/22/2014, 11:49 AM

## 2014-04-22 NOTE — Progress Notes (Signed)
Adult Services Patient-Family Contact/Session  Attendees:  Lowry Bowl. [phone] and Selmer Dominion, LCSW - phone call  Goal(s):  Discuss mother's concern about plan for discharge today  Safety Concerns:  Mother reports patient should not be discharged due to continuing psychosis  Narrative:  CSW returned mother's phone call, and spoke with her at length about her experiences of receiving phone calls from patient that she believes indicate that patient is still psychotic.  She gave an example that patient reports she is breathing better now, that the hospital must be pumping oxygen into her room.  She also complained that the only medication her daughter is taking is Abilify, and that she was supposed to get a phone call from "Dr. Thomasena Edis" yesterday to discuss her condition, and did not get that phone call.  Barrier(s):  Patient appears to remain psychotic and not ready for discharge.  Mother is concerned that patient may not be receiving sufficient medication.  Interventions:  Read mother list of medications on the "Current Medications" list.  CSW did not provide any education about these medications, their intent, their side-effects, or such, but only gave her the names, dosages, times to be taken, and dates started.  Recommendation(s):    1.  Patient not to be discharged.    2.  Dr. to call mother.  Follow-up Required:  Yes  Explanation:  Mother has requested phone call from doctor, was told "Dr. Thomasena Edis" would be calling yesterday, and did not receive such a call.  It is recommended that her expectation be filled, as per information she was given by previous CSW.  Lysle Dingwall 04/22/2014, 10:48 AM

## 2014-04-22 NOTE — Progress Notes (Signed)
[facility] Group Notes:  (Nursing/MHT/Case Management/Adjunct)  Date:  04/22/2014  Time:  9:18 PM  Type of Therapy:  Group Therapy  Participation Level:  Did Not Attend  Participation Quality:  Did Not Attend  Affect:  Did Not Attend  Cognitive:  Did Not Attend  Insight:  None  Engagement in Group:  Did Not Attend  Modes of Intervention:  Socialization and Support  Summary of Progress/Problems: Pt. Was resting in room.  Lanell Persons 04/22/2014, 9:18 PM

## 2014-04-22 NOTE — Progress Notes (Signed)
D: Pt presents with anxious affect and mood.  Pt was suspicious, paranoid.  Pt reports her goal for the day was "safety.  I've been safe today."  Pt then smiled and stated "I think you should get an award."  Pt denies SI, HI, and hallucinations.  Pt interacted with peers minimally and did not attend evening group.  Instead, pt was pacing hall at times or was in her room.    A:  Supported pt and encouraged pt to notify staff of needs/concerns.  Encouraged medication compliance.  Safety maintained.    R:  Pt was not compliant with Abilify 15mg  PO at HS.  Pt asked writer what the dosages were of her medications and dosages were reviewed with pt.  Pt stated "at least the Abilify was lowered.  I'll take the Ambien, but I'm not taking the Abilify."  Pt verbally contracted for safety and is in no acute distress at this time.  Will continue to monitor and assess for safety.

## 2014-04-22 NOTE — Progress Notes (Signed)
Patient ID: [name], female   DOB: 01-22-1961, 53 y.o.   MRN: [id number]  Patient currently asleep; no s/s of distress noted at this time. Respirations regular and unlabored.

## 2014-04-23 LAB — URINALYSIS, ROUTINE W REFLEX MICROSCOPIC
Bilirubin Urine: NEGATIVE
GLUCOSE, UA: NEGATIVE mg/dL
Hgb urine dipstick: NEGATIVE
Ketones, ur: NEGATIVE mg/dL
LEUKOCYTES UA: NEGATIVE
Nitrite: NEGATIVE
PH: 6 (ref 5.0–8.0)
PROTEIN: NEGATIVE mg/dL
Specific Gravity, Urine: 1.012 (ref 1.005–1.030)
Urobilinogen, UA: 0.2 mg/dL (ref 0.0–1.0)

## 2014-04-23 LAB — URINE CULTURE
COLONY COUNT: NO GROWTH
CULTURE: NO GROWTH

## 2014-04-23 MED ORDER — OLANZAPINE 10 MG PO TBDP
10.0000 mg | ORAL_TABLET | Freq: Three times a day (TID) | ORAL | Status: DC | PRN
  Administered 2014-04-23: 10 mg via ORAL
  Filled 2014-04-23: qty 1

## 2014-04-23 NOTE — Discharge Instructions (Signed)
No evidence of urinary retention or uti.  Follow up with your gynecologist after discharge.

## 2014-04-23 NOTE — Plan of Care (Signed)
Problem: Ineffective individual coping Goal: STG: Patient will remain free from self harm Outcome: Progressing No self harm behaviors this shift.    Problem: Diagnosis: Increased Risk For Suicide Attempt Goal: STG-Patient Will Attend All Groups On The Unit Outcome: Not Progressing Pt did not attend evening group on 04/22/14 Goal: STG-Patient Will Comply With Medication Regime Outcome: Not Progressing Pt refused Abilify 15mg  PO on 04/22/14 at [facility]

## 2014-04-23 NOTE — ED Provider Notes (Signed)
[name]: [id number]     Arrival date & time 04/23/14  2053 History   First MD Initiated Contact with Patient 04/23/14 2148     No chief complaint on file.    (Consider location/radiation/quality/duration/timing/severity/associated sxs/prior Treatment) HPI 53 y.o. Female sent to ed from behavioral health with multiple complaints.  Patient states she is urinating on herself and is unable to void.  She states she has a urethal mass.  She also complains that her blood pressure is high.  She states over and over that she needs a witness to confirm these complaints.  Past Medical History  Diagnosis Date  . Headache(784.0)   . IBS (irritable bowel syndrome)   . Bipolar 1 disorder     HX PSYCHOSIS  . Personality disorder     W/ BORDERLINE FEATURES  . History of duodenal ulcer   . Pelvic pain   . Complication of anesthesia     POST AGGITATION  . Frequency of urination   . Urgency of urination   . Nocturia    Past Surgical History  Procedure Laterality Date  . Laparoscopy N/A 12/14/2012    Procedure: LAPAROSCOPY DIAGNOSTIC;  Surgeon: Stark Klein, MD;  Location: [facility];  Service: General;  Laterality: N/A;  . Laparoscopic left salpingoophorectomy and lysys adhesions  03-10-2002  . Cardiovascular stress test  01-23-2011    NORMAL NUCLEAR STUDY/ NO ISCHEMIA/ EF 81%  . Transthoracic echocardiogram  10-13-2010    NORMAL LVF/  EF 55-60%  . Laparoscopic cholecystectomy  11-08-2001  . Cardiac catheterization  10-09-1999  DR KATZ    NORMAL LVF/  NORMAL RCA/  NO CRITICAL DISEASE LEFT CORONARY SYSTEM  . Laparoscopic removal ovary remnant  2003   [place]  . Total abdominal hysterectomy  2000    W/ RIGHT SALPINGOOPHORECTOMY  . Cystoscopy with biopsy N/A 06/03/2013    Procedure: CYSTOSCOPY WITH BIOPSY  INSTILLATION OF MARCAINE AND PYRIDIUM;  Surgeon: Hanley Ben, MD;  Location: [facility];  Service: Urology;  Laterality: N/A;   Family History  Problem Relation Age of Onset  .  Sleep apnea Father   . Migraines Sister     headaches  . Other Mother     MAC infection   History  Substance Use Topics  . Smoking status: Former Smoker -- 0.01 packs/day for 1 years    Types: Cigarettes    Quit date: 08/18/2009  . Smokeless tobacco: Never Used     Comment: ONLY SMOKED FOR 6 MONTHS --  QUIT  YRS AGO  . Alcohol Use: 1.8 oz/week    3 Cans of beer per week     Comment: socially    OB History   Grav Para Term Preterm Abortions TAB SAB Ect Mult Living                 Review of Systems  Constitutional: Positive for activity change.  HENT: Negative.   Eyes: Negative.   Respiratory: Negative.   Cardiovascular: Negative.   Gastrointestinal: Negative.   Endocrine: Negative.   Genitourinary:       As per hpi  Musculoskeletal: Negative.   Allergic/Immunologic: Negative.   Neurological: Negative.   Hematological: Negative.   Psychiatric/Behavioral: Positive for agitation.      Allergies  Morphine and related  Home Medications   Prior to Admission medications   Medication Sig Start Date End Date Taking? Authorizing Provider  ARIPiprazole (ABILIFY) 5 MG tablet Take 5 tablets (25 mg total) by mouth at bedtime. 04/22/14  Nena Polio, PA-C  gabapentin (NEURONTIN) 100 MG capsule Take 2 capsules (200 mg total) by mouth 2 (two) times daily. 04/22/14   Nena Polio, PA-C  lithium carbonate 300 MG capsule Take 1 capsule (300 mg total) by mouth at bedtime. 04/22/14   Nena Polio, PA-C  venlafaxine XR (EFFEXOR-XR) 75 MG 24 hr capsule Take 75 mg by mouth daily with breakfast.    Historical Provider, MD  zolpidem (AMBIEN) 10 MG tablet Take 10 mg by mouth at bedtime.    Historical Provider, MD  zolpidem (AMBIEN) 10 MG tablet Take 1 tablet (10 mg total) by mouth at bedtime. 04/22/14 05/22/14  Nena Polio, PA-C   BP 132/94  Pulse 103  Temp(Src) 99.1 F (37.3 C) (Oral)  Resp 16  Ht 4\' 10"  (1.473 m)  Wt 142 lb (64.411 kg)  BMI 29.69 kg/m2  SpO2 95% Physical Exam   Nursing note and vitals reviewed. Constitutional: She is oriented to person, place, and time. She appears well-developed and well-nourished.  HENT:  Head: Normocephalic and atraumatic.  Right Ear: External ear normal.  Left Ear: External ear normal.  Nose: Nose normal.  Mouth/Throat: Oropharynx is clear and moist.  Eyes: Pupils are equal, round, and reactive to light.  Neck: Normal range of motion.  Cardiovascular: Normal rate, regular rhythm, normal heart sounds and intact distal pulses.   Pulmonary/Chest: Effort normal.  Abdominal: Soft. Bowel sounds are normal. She exhibits no distension.  Genitourinary: Vagina normal.  Bimanual exam- no palpable abnormalities  Musculoskeletal: Normal range of motion.  Neurological: She is alert and oriented to person, place, and time. She has normal reflexes.  Skin: Skin is warm and dry.  Psychiatric: Her mood appears anxious. Her affect is labile and inappropriate. Her speech is rapid and/or pressured. She is agitated and hyperactive. Thought content is paranoid and delusional. She expresses impulsivity. She is inattentive.    ED Course  Procedures (including critical care time) Labs Review Labs Reviewed  GLUCOSE, CAPILLARY - Abnormal; Notable for the following:    Glucose-Capillary 208 (*)    All other components within normal limits  HEMOGLOBIN A1C - Abnormal; Notable for the following:    Hemoglobin A1C 6.0 (*)    Mean Plasma Glucose 126 (*)    All other components within normal limits  BASIC METABOLIC PANEL - Abnormal; Notable for the following:    Glucose, Bld 141 (*)    All other components within normal limits  LITHIUM LEVEL - Abnormal; Notable for the following:    Lithium Lvl 0.27 (*)    All other components within normal limits  URINE CULTURE  URINALYSIS W MICROSCOPIC  CBC  URINALYSIS, ROUTINE W REFLEX MICROSCOPIC    Imaging Review No results found.   EKG Interpretation None      MDM   Final diagnoses:   Schizoaffective disorder, bipolar type  Somatic symptom disorder, persistent, moderate  Hyperglycemia  Overweight (BMI 25.0-29.9)  Urinary frequency  Delusional disorder  DISORDER, BIPOLAR NOS   Patient with no evidence of urinary retention or ute.  bp check by md at 120/80.   Shaune Pollack, MD 04/23/14 2250

## 2014-04-23 NOTE — Progress Notes (Signed)
Pt approached writer stating "I need a hospital bed right now."  Pt continued, "I haven't been able to urinate.  I have a pelvic mass and I haven't been able to urinate all day.  I'm having pelvic pain and I feel like my leg is kind of numb."  PRN medication for pain offered, pt refused.  Pt continued to walk in hallway.  Order received from on call provider to transfer to [facility] for evaluation.  Vital signs obtained, see flow sheet.  [facility] called for transport.  Report called to [employer].  Pt denies SI/HI at this time.

## 2014-04-23 NOTE — Progress Notes (Signed)
D: Pt has had labile mood and affect.  Pt has been cooperative with staff at times and verbally aggressive towards staff at times.  Pt denies SI,HI, and hallucinations.  Pt continues to be paranoid, making statements such as "for all I know, you could be an imposter.  You know, I have a lawyer."  Pt also demands to see packages of medications prior to taking them and continues to refuse Abilify.  Pt has been argumentative, demanding, and intrusive with staff asking questions and making statements such as "who is on call?  You need to get Percocet ordered for me."  Pt was demanding "stronger" pain medications after refusing ibuprofen earlier in shift.      A: Encouraged compliance with medications as ordered.  Provided pt with emotional support.  Safety maintained.    R: Pt is difficult to redirect at times.  Pt is currently resting in her room and is in no distress.  Will continue to monitor and assess for safety.

## 2014-04-23 NOTE — [facility] Group Notes (Signed)
[facility] Group Notes:  (Nursing/MHT/Case Management/Adjunct)  Date:  04/23/2014  Time:  0920 and 0940  Type of Therapy:  Psychoeducational Skills  Participation Level:  Did Not Attend  [name] 04/23/2014, 1:44 PM

## 2014-04-23 NOTE — ED Notes (Signed)
Pt ambulated with supervision to restroom and urine sample obtained and is now at bedside to be sent if orders received. Upon return to pt's room, she states that she is very weak and she thinks "she is going to code". When asked what she is feeling she states that "she's having a hard time breathing and her legs are weak". Pt exhibits no difficulty breathing and is currently pacing the room. When asked to get onto the stretcher, pt is able to climb on to stretcher with no assistance needed. Then pt slaps the side rail and complains that "no one is taking me serious". Security is called to room to aid with pt's aggression.

## 2014-04-23 NOTE — Progress Notes (Signed)
Patient ID: [name], female   DOB: [birth year], 53 y.o.   MRN: [id number] [facility] MD Progress Note  04/23/2014 10:17 AM [name]  MRN:  [id number] Subjective: " I think I am doing better today but I still  believes terrorists are out to get me."    Objective : Patient seen and her chart was reviewed. Patient has difficulty sleeping and she remains delusional with the believe that she is being monitored and  terrorists are after her. However, her thought process remains disorganized and bizarre. She is loquacious, evasive, easily irritable, self isolated in her room with minimal interaction with her peers. She denies suicidal or homicidal ideations, intent or plan. Today, patient reports decreased urinary frequency since she was taking off the Lithuim.  Diagnosis:   DSM5: Primary psychiatric diagnosis:  Schizoaffective disorder,bipolar type,multiple episodes ,currently in acute episode   Secondary psychiatric diagnosis:  Somatic symptom disorder,persistent,moderate   Non psychiatric diagnosis:  Hx of generalized weakness (all work up wnl)  IBS  Hx of duodenal ulcer  Hx of frequency of urination     ADL's:  Intact  Sleep: Fair  Appetite:  Fair  Suicidal Ideation:  Refused to answe Homicidal Ideation:  denies AEB (as evidenced by):  Psychiatric Specialty Exam: Physical Exam  Constitutional: She appears well-developed and well-nourished.  Psychiatric: Her speech is normal. Her affect is inappropriate. Thought content is paranoid and delusional. Cognition and memory are normal. She expresses inappropriate judgment. She is inattentive.    Review of Systems  Genitourinary: Positive for urgency and frequency.  Psychiatric/Behavioral: Positive for hallucinations (delusional). Negative for suicidal ideas. The patient is nervous/anxious.     Blood pressure 135/92, pulse 102, temperature 98.2 F (36.8 C), temperature source Oral, resp. rate 16, height '4\' 10"'  (1.473 m),  weight 64.411 kg (142 lb), SpO2 95.00%.Body mass index is 29.69 kg/(m^2).  General Appearance: Casual  Eye Contact::  Fair  Speech:  Normal Rate  Volume:  Normal  Mood:  Euthymic  Affect:  Inappropriate  Thought Process:  Loose and Tangential  Orientation:  Full (Time, Place, and Person)  Thought Content:  Delusions, Ideas of Reference:   Paranoia Delusions and Paranoid Ideation - she has special powers.  Suicidal Thoughts: denies  Homicidal Thoughts: denies  Memory:  Negative  Judgement:  Impaired  Insight:  Lacking  Psychomotor Activity:  Normal  Concentration:  Poor  Recall:  Poor  Fund of Knowledge:Poor  Language: Fair  Akathisia:  No  Handed: right  AIMS (if indicated):   0  Assets:  Communication Skills  Sleep:  Number of Hours: 2.5   Musculoskeletal: Strength & Muscle Tone: within normal limits Gait & Station: normal Patient leans: N/A  Current Medications: Current Facility-Administered Medications  Medication Dose Route Frequency Provider Last Rate Last Dose  . alum & mag hydroxide-simeth (MAALOX/MYLANTA) 200-200-20 MG/5ML suspension 30 mL  30 mL Oral Q4H PRN Clarene Reamer, MD      . ARIPiprazole (ABILIFY) tablet 15 mg  15 mg Oral QHS Kharizma Lesnick      . benztropine (COGENTIN) tablet 0.5 mg  0.5 mg Oral BID Nakia Koble   0.5 mg at 04/23/14 0805  . diclofenac sodium (VOLTAREN) 1 % transdermal gel 4 g  4 g Topical QID Ursula Alert, MD   4 g at 04/23/14 0807  . docusate sodium (COLACE) capsule 100 mg  100 mg Oral BID Ursula Alert, MD   100 mg at 04/23/14 0807  . gabapentin (NEURONTIN) capsule 200  mg  200 mg Oral BID Ursula Alert, MD   200 mg at 04/23/14 0807  . hydrOXYzine (ATARAX/VISTARIL) tablet 50 mg  50 mg Oral TID PRN Ursula Alert, MD   50 mg at 04/23/14 0028  . ibuprofen (ADVIL,MOTRIN) tablet 600 mg  600 mg Oral Q4H PRN Ursula Alert, MD   600 mg at 04/23/14 0429  . magnesium hydroxide (MILK OF MAGNESIA) suspension 30 mL  30 mL Oral Daily PRN  Clarene Reamer, MD   30 mL at 04/17/14 0820  . naproxen (NAPROSYN) tablet 500 mg  500 mg Oral BID PRN Ermin Parisien   500 mg at 04/23/14 0820  . OLANZapine (ZYPREXA) injection 5 mg  5 mg Intramuscular Once Charlcie Cradle, MD      . ondansetron [facility]) tablet 4 mg  4 mg Oral Q8H PRN Clarene Reamer, MD      . zolpidem [facility]) tablet 10 mg  10 mg Oral QHS Laverle Hobby, PA-C   10 mg at 04/22/14 2100    Lab Results:  Results for orders placed during the hospital encounter of 04/12/14 (from the past 48 hour(s))  URINALYSIS W MICROSCOPIC     Status: None   Collection Time    04/21/14  3:45 PM      Result Value Ref Range   Color, Urine YELLOW  YELLOW   APPearance CLEAR  CLEAR   Specific Gravity, Urine 1.007  1.005 - 1.030   pH 6.0  5.0 - 8.0   Glucose, UA NEGATIVE  NEGATIVE mg/dL   Hgb urine dipstick NEGATIVE  NEGATIVE   Bilirubin Urine NEGATIVE  NEGATIVE   Ketones, ur NEGATIVE  NEGATIVE mg/dL   Protein, ur NEGATIVE  NEGATIVE mg/dL   Urobilinogen, UA 0.2  0.0 - 1.0 mg/dL   Nitrite NEGATIVE  NEGATIVE   Leukocytes, UA NEGATIVE  NEGATIVE   Urine-Other       Value: NO FORMED ELEMENTS SEEN ON URINE MICROSCOPIC EXAMINATION   Comment: Performed at [place] [facility]     Status: Abnormal   Collection Time    04/21/14  7:38 PM      Result Value Ref Range   Hemoglobin A1C 6.0 (*) <5.7 %   Comment: (NOTE)                                                                               According to the ADA Clinical Practice Recommendations for 2011, when     HbA1c is used as a screening test:      >=6.5%   Diagnostic of Diabetes Mellitus               (if abnormal result is confirmed)     5.7-6.4%   Increased risk of developing Diabetes Mellitus     References:Diagnosis and Classification of Diabetes Mellitus,Diabetes     TDDU,2025,42(HCWCB 1):S62-S69 and Standards of Medical Care in             Diabetes - 2011,Diabetes Care,2011,34 (Suppl 1):S11-S61.   Mean  Plasma Glucose 126 (*) <117 mg/dL   Comment: Performed at [facility]  CBC     Status: None   Collection Time  04/21/14  7:38 PM      Result Value Ref Range   WBC 9.4  4.0 - 10.5 K/uL   RBC 4.55  3.87 - 5.11 MIL/uL   Hemoglobin 13.1  12.0 - 15.0 g/dL   HCT 40.0  36.0 - 46.0 %   MCV 87.9  78.0 - 100.0 fL   MCH 28.8  26.0 - 34.0 pg   MCHC 32.8  30.0 - 36.0 g/dL   RDW 12.8  11.5 - 15.5 %   Platelets 350  150 - 400 K/uL   Comment: Performed at [facility]     Status: Abnormal   Collection Time    04/21/14  7:38 PM      Result Value Ref Range   Sodium 143  137 - 147 mEq/L   Potassium 4.2  3.7 - 5.3 mEq/L   Chloride 105  96 - 112 mEq/L   CO2 24  19 - 32 mEq/L   Glucose, Bld 141 (*) 70 - 99 mg/dL   BUN 12  6 - 23 mg/dL   Creatinine, Ser 0.65  0.50 - 1.10 mg/dL   Calcium 10.3  8.4 - 10.5 mg/dL   GFR calc non Af Amer >90  >90 mL/min   GFR calc Af Amer >90  >90 mL/min   Comment: (NOTE)     The eGFR has been calculated using the CKD EPI equation.     This calculation has not been validated in all clinical situations.     eGFR's persistently <90 mL/min signify possible Chronic Kidney     Disease.   Anion gap 14  5 - 15   Comment: Performed at [facility]     Status: Abnormal   Collection Time    04/22/14  6:14 AM      Result Value Ref Range   Lithium Lvl 0.27 (*) 0.80 - 1.40 mEq/L   Comment: Performed at [facility]    Physical Findings: AIMS: Facial and Oral Movements Muscles of Facial Expression: None, normal Lips and Perioral Area: None, normal Jaw: None, normal Tongue: None, normal,Extremity Movements Upper (arms, wrists, hands, fingers): None, normal Lower (legs, knees, ankles, toes): None, normal, Trunk Movements Neck, shoulders, hips: None, normal, Overall Severity Severity of abnormal movements (highest score from questions above): None, normal Incapacitation  due to abnormal movements: None, normal Patient's awareness of abnormal movements (rate only patient's report): No Awareness, Dental Status Current problems with teeth and/or dentures?: No Does patient usually wear dentures?: No  CIWA:    COWS:     Treatment Plan Summary: Daily contact with patient to assess and evaluate symptoms and progress in treatment Medication management  Plan:  Will increase Abilify to 30 mg po daily.Patient is very sensitive to medications and will be cautious about giving IM as well dose increase. Will continue gabapentin 267m BID dosing since patient does not want to be in bed all day. Continue  Abilify  110mpo BID for mood stabilization/delusions Continue Cogentin 0.17m53mo bid for EPS prevention. Will continue  ambien  10 mg po qhs. Will continue  vistaril prn for anxiety. Will try to avoid ativan and related meds 2/2 to high fall risk as well as cognitive changes . CSW will work on disposition. Patient to participate in therapeutic milieu. Will continue colace for constipation. Cancel today's discharge due to patient's worsening symptoms.    Medical Decision Making Problem Points:  Established problem,  worsening (2) and Review of psycho-social stressors (1) Data Points:  Review of medication regiment & side effects (2)  I certify that inpatient services furnished can reasonably be expected to improve the patient's condition.   Corena Pilgrim ,MD 04/23/2014, 10:17 AM

## 2014-04-23 NOTE — Progress Notes (Signed)
Patient ID: [name], female   DOB: 17-Dec-1960, 53 y.o.   MRN: [id number] D: Patient continues to exhibit attention seeking behaviors.  She is resistant to taking her medications.  She watched the nurse take her medications out of the packet and then requested that new ones be pulled so she could be certain she was getting the right meds.  She attempted to walk off several times during med pass.  She requested two pitchers of ice water, some heat packs and a new pair of scrubs.  She denies any SI/HI/AVH.  She continues to have mood lability and disorganized thinking.  Patient asked, "do you think I should contact my family.  I think they are dead."  Patient has not been attending any group, electing to remain isolative to room. A: Continue to monitor medication management and MD orders.  Safety checks completed every 15 minutes per protocol. R: Patient is redirectable.

## 2014-04-23 NOTE — [facility] Group Notes (Signed)
[place] Group Notes:  (Clinical Social Work)  04/23/2014   11:15am-12:00pm  Summary of Progress/Problems:  The main focus of today's process group was to listen to a variety of genres of music and to identify that different types of music provoke different responses.  The patient then was able to identify personally what was soothing for them, as well as energizing.  The patient expressed understanding of concepts, as well as knowledge of how each type of music affected him/her and how this can be used at home as a wellness/recovery tool.  She was late to group, however, and kept leaving/returning.    Type of Therapy:  Music Therapy   Participation Level:  Minimal  Participation Quality:  Attentive  Affect:  Blunted  Cognitive:  Confused  Insight:  Limited  Engagement in Therapy:  Limited  Modes of Intervention:   Activity, Exploration  Selmer Dominion, LCSW 04/23/2014, 12:30pm

## 2014-04-23 NOTE — Progress Notes (Signed)
Pt returned from [facility].  Pt received Ibuprofen PRN for foot pain.  Pt continues to have labile mood.  Pt refused Abilify, refused Zyprexa PRN for agitation.  Pt stated "I just want my Voltaren and Ambien.  I just want to go to sleep."  Pt then went to room.  Pt is in no distress at this time.  Will continue to monitor and assess for safety.

## 2014-04-24 MED ORDER — DICLOFENAC SODIUM 1 % TD GEL: 4.0000 g | Freq: Four times a day (QID) | TRANSDERMAL | Status: DC

## 2014-04-24 MED ORDER — IBUPROFEN 600 MG PO TABS: 600.0000 mg | ORAL_TABLET | ORAL | Status: DC | PRN

## 2014-04-24 MED ORDER — LITHIUM CARBONATE 300 MG PO CAPS: 300.0000 mg | ORAL_CAPSULE | Freq: Every day | ORAL | Status: DC

## 2014-04-24 MED ORDER — GABAPENTIN 100 MG PO CAPS: 200.0000 mg | ORAL_CAPSULE | Freq: Two times a day (BID) | ORAL | Status: DC

## 2014-04-24 MED ORDER — ARIPIPRAZOLE 15 MG PO TABS: 15.0000 mg | ORAL_TABLET | Freq: Every day | ORAL | Status: DC

## 2014-04-24 MED ORDER — ZOLPIDEM TARTRATE 10 MG PO TABS: 10.0000 mg | ORAL_TABLET | Freq: Every day | ORAL | Status: DC

## 2014-04-24 MED ORDER — BENZTROPINE MESYLATE 0.5 MG PO TABS: 0.5000 mg | ORAL_TABLET | Freq: Two times a day (BID) | ORAL | Status: DC

## 2014-04-24 MED ORDER — DSS 100 MG PO CAPS: 100.0000 mg | ORAL_CAPSULE | Freq: Two times a day (BID) | ORAL | Status: DC

## 2014-04-24 NOTE — [facility] Suicide Risk Assessment (Signed)
Demographic Factors:  Caucasian, Low socioeconomic status and Living alone  Total Time spent with patient: 20 minutes  Psychiatric Specialty Exam: Physical Exam  Constitutional: She is oriented to person, place, and time. She appears well-developed and well-nourished.  HENT:  Head: Normocephalic and atraumatic.  Cardiovascular: Normal rate and regular rhythm.   Respiratory: Effort normal and breath sounds normal.  GI: Soft.  Musculoskeletal: Normal range of motion.  Neurological: She is alert and oriented to person, place, and time.  Skin: Skin is warm.  Psychiatric: Her speech is normal and behavior is normal. Judgment and thought content normal. Her mood appears anxious (STABLE). Cognition and memory are normal.    Review of Systems  Constitutional: Negative.   HENT: Negative.   Eyes: Negative.   Respiratory: Negative.   Cardiovascular: Negative.   Gastrointestinal: Negative.   Genitourinary: Negative.   Skin: Negative.   Neurological: Negative.   Psychiatric/Behavioral: The patient is nervous/anxious (STABLE ,BUT CONTINUES TO BE FIXED ON SOMATIC SX ,WHICH SHE DOES AT BASELINE).     Blood pressure 113/75, pulse 85, temperature 97.9 F (36.6 C), temperature source Oral, resp. rate 16, height 4\' 10"  (1.473 m), weight 64.411 kg (142 lb), SpO2 95.00%.Body mass index is 29.69 kg/(m^2).  General Appearance: Casual  Eye Contact::  Good  Speech:  Clear and Coherent  Volume:  Normal  Mood:  Anxious -she is somatic at baseline ,has multiple health concerns ,was in the ED last night ,was medically cleared. Patient continues to be anxious about health issues and other medical issues like having urinary sx (but she does not have it)  Affect:  Congruent  Thought Process:  Coherent  Orientation:  Full (Time, Place, and Person)  Thought Content:  Negative  Suicidal Thoughts:  No  Homicidal Thoughts:  No  Memory:  Immediate;   Fair Recent;   Fair Remote;   Fair  Judgement:  Fair   Insight:  Fair  Psychomotor Activity:  Normal  Concentration:  Fair  Recall:  [employer] of Knowledge:Fair  Language: Fair  Akathisia:  No    AIMS (if indicated):   0  Assets:  Communication Skills  Sleep:  Number of Hours: 5.75    Musculoskeletal: Strength & Muscle Tone: within normal limits Gait & Station: normal Patient leans: N/A   Mental Status Per Nursing Assessment::   On Admission:  NA  Current Mental Status by Physician: Denies SI/HI/AH/VH  Loss Factors: NA  Historical Factors: Prior suicide attempts  Risk Reduction Factors:   Positive social support  Continued Clinical Symptoms:  Previous Psychiatric Diagnoses and Treatments  Cognitive Features That Contribute To Risk:  Closed-mindedness    Suicide Risk:  Minimal: No identifiable suicidal ideation.  Patients presenting with no risk factors but with morbid ruminations; may be classified as minimal risk based on the severity of the depressive symptoms  Discharge Diagnoses:  Primary psychiatric diagnosis:  Schizoaffective disorder,bipolar type,multiple episodes ,currently in acute episode   Secondary psychiatric diagnosis:  Somatic symptom disorder,persistent,moderate   Non psychiatric diagnosis:  Hx of generalized weakness (all work up wnl)  IBS  Hx of duodenal ulcer  Hx of frequency of urination   Past Medical History  Diagnosis Date  . Headache(784.0)   . IBS (irritable bowel syndrome)   . Bipolar 1 disorder     HX PSYCHOSIS  . Personality disorder     W/ BORDERLINE FEATURES  . History of duodenal ulcer   . Pelvic pain   . Complication of anesthesia  POST AGGITATION  . Frequency of urination   . Urgency of urination   . Nocturia     Plan Of Care/Follow-up recommendations:  Activity:  No restrictions  Is patient on multiple antipsychotic therapies at discharge:  No   Has Patient had three or more failed trials of antipsychotic monotherapy by history:  No  Recommended Plan  for Multiple Antipsychotic Therapies: NA    Karim Aiello 04/24/2014, 10:48 AM

## 2014-04-24 NOTE — Clinical Social Work Note (Signed)
Spoke with both mother Kaleesi Guyton, [phone] and sister Dutch Quint [phone] who both disagreed with plan to d/c, stating pt is at risk of self harm due to psychosis and non-compliance with meds.  I explained the rationale for discharge, and also asked about Vashti being able to stay with them so they could assist with supporting her with medication compliance.  Both declined.  I encouraged them to initiate IVC paperwork when they become concerned about pt again.

## 2014-04-24 NOTE — Progress Notes (Signed)
[facility] Adult Case Management Discharge Plan :  Will you be returning to the same living situation after discharge: Yes,  home At discharge, do you have transportation home?:Yes,  cab Do you have the ability to pay for your medications:Yes,  insurance  Release of information consent forms completed and in the chart;  Patient's signature needed at discharge.  Patient to Follow up at: Follow-up Information   Follow up with [facility] On 06/08/2014. (for medication management.  You may call on a daily basis to see if you can be fit in due to a cancellation.  In the event that your medication supply is going to run out, you may request a refill through the nubmer listed above. )    Contact information:   [place] [id number] [phone]      Patient denies SI/HI:   Yes,  yes    Safety Planning and Suicide Prevention discussed:  Yes,  yes  Trish Mage 04/24/2014, 12:34 PM

## 2014-04-24 NOTE — Progress Notes (Signed)
Patient ID: [name], female   DOB: 1961/01/25, 53 y.o.   MRN: [id number] Nursing discharge note:  Patient discharge home per MD order.  Patient stated, "I had a meltdown last night.  I'm not ready to be discharged."  Patient was informed this morning that she would be discharged around lunch time.  No family members were agreeable to come pick the patient up so a cab was called.  Staff attempted to get patient ready once cab was here and patient declined to assist.  When cab arrived, patient proceeded to go to the bathroom and not come out.  Staff assisted patient from the bathroom and patient grabbed a toothbrush and toothpaste.  She stated, "I have to brush my teeth before I leave!"  Patient declined to sign her discharge instructions.  She declined to sign her belongings sheet.  Patient was put into a wheelchair and wheeled out to the lobby with [facility], staff nurse, 2 MHTs and security.  She denies any SI/HI/AVH.  Patient was assisted into the cab and her belongings (purse) was given to her.  She left without further incident.

## 2014-04-24 NOTE — Tx Team (Signed)
  Interdisciplinary Treatment Plan Update   Date Reviewed:  04/24/2014  Time Reviewed:  12:20 PM  Progress in Treatment:   Attending groups: Yes Participating in groups: Yes Taking medication as prescribed: Yes  Tolerating medication: Yes Family/Significant other contact made: Yes  Patient understands diagnosis: Yes  Discussing patient identified problems/goals with staff: Yes  See initial care plan Medical problems stabilized or resolved: Yes Denies suicidal/homicidal ideation: Yes  In tx team Patient has not harmed self or others: Yes  For review of initial/current patient goals, please see plan of care.  Estimated Length of Stay:  D/C today  Reason for Continuation of Hospitalization:   New Problems/Goals identified:  N/A  Discharge Plan or Barriers:   return home, follow up outpt  Additional Comments:  Attendees:  Signature: Steva Colder, MD 04/24/2014 12:20 PM   Signature: Ripley Fraise, [place] 04/24/2014 12:20 PM  Signature: Elmarie Shiley, NP 04/24/2014 12:20 PM  Signature: Mayra Neer, RN 04/24/2014 12:20 PM  Signature: Darrol Angel, RN 04/24/2014 12:20 PM  Signature:  04/24/2014 12:20 PM  Signature:   04/24/2014 12:20 PM  Signature:    Signature:    Signature:    Signature:    Signature:    Signature:      Scribe for Treatment Team:   Ripley Fraise, LCSW  04/24/2014 12:20 PM

## 2014-04-24 NOTE — Discharge Summary (Signed)
Physician Discharge Summary Note  Patient:  [name] is an 53 y.o., female MRN:  [id number] DOB:  1961-03-04 Patient phone:  [phone] (home)  Patient address:   [address] [place] [address],  Total Time spent with patient: 1 hour  Date of Admission:  04/12/2014 Date of Discharge: 04/24/14  Reason for Admission: Mood stabilization treatment  Discharge Diagnoses: Active Problems:   Hyperglycemia   Urinary frequency   Psychotic disorder   Overweight (BMI 25.0-29.9)   Psychiatric Specialty Exam: Physical Exam  Psychiatric: Her speech is normal and behavior is normal. Judgment and thought content normal. Her mood appears not anxious. Her affect is not angry, not blunt, not labile and not inappropriate. Cognition and memory are normal. She does not exhibit a depressed mood.    Review of Systems  Constitutional: Negative.   HENT: Negative.   Eyes: Negative.   Respiratory: Negative.   Cardiovascular: Negative.   Gastrointestinal: Negative.   Genitourinary: Negative.   Musculoskeletal: Negative.   Skin: Negative.   Neurological: Negative.   Endo/Heme/Allergies: Negative.   Psychiatric/Behavioral: Positive for suicidal ideas (Stable) and hallucinations (Stable). Negative for depression, memory loss and substance abuse. The patient has insomnia (Stable). The patient is not nervous/anxious.     Blood pressure 113/75, pulse 85, temperature 97.9 F (36.6 C), temperature source Oral, resp. rate 16, height '4\' 10"'  (1.473 m), weight 64.411 kg (142 lb), SpO2 95.00%.Body mass index is 29.69 kg/(m^2).  General Appearance: Casual  Eye Contact:: Good  Speech: Clear and Coherent  Volume: Normal  Mood: Anxious -she is somatic at baseline ,has multiple health concerns ,was in the ED last night ,was medically cleared. Patient continues to be anxious about health issues and other medical issues like having urinary sx (but she does not have it)  Affect: Congruent   Thought Process: Coherent  Orientation: Full (Time, Place, and Person)  Thought Content: Negative  Suicidal Thoughts: No  Homicidal Thoughts: No Memory: Immediate; Fair  Recent; Fair  Remote; Fair  Judgement: Fair  Insight: Fair  Psychomotor Activity: Normal  Concentration: Fair  Recall: [employer] [place]  Language: Fair  Akathisia: No  AIMS (if indicated): 0  Assets: Communication Skills  Sleep: Number of Hours: 5.75   Past Psychiatric History: Diagnosis: Psychotic disorder  Hospitalizations: [facility] adult unit  Outpatient Care: [facility]  Substance Abuse Care: NA  Self-Mutilation: NA  Suicidal Attempts: NA  Violent Behaviors: NA   Musculoskeletal: Strength & Muscle Tone: within normal limits Gait & Station: normal Patient leans: N/A   Discharge Diagnoses: DSM 5 Primary psychiatric diagnosis:  Schizoaffective disorder,bipolar type,multiple episodes ,currently in acute episode   Secondary psychiatric diagnosis:  Somatic symptom disorder,persistent,moderate   Non psychiatric diagnosis:  Hx of generalized weakness (all work up wnl)  IBS  Hx of duodenal ulcer  Hx of frequency of urination    Past Medical History  Diagnosis Date  . Headache(784.0)   . IBS (irritable bowel syndrome)   . Bipolar 1 disorder     HX PSYCHOSIS  . Personality disorder     W/ BORDERLINE FEATURES  . History of duodenal ulcer   . Pelvic pain   . Complication of anesthesia     POST AGGITATION  . Frequency of urination   . Urgency of urination   . Nocturia     Level of Care:  OP  Hospital Course:  Patient is a 53 year old Caucasian female, who presented very  disorganized with bizzare behavior to the ED . Patient is divorced, unemployed, lives by self in [place], and has Past hx of bipolar disorder. Patient reports that she feels very slowed down and weak and that her psychiatrist was trying to reduce her medication Lithium in order to help  her. She reports she does not have any mood swing, but does have a lot of thought racing. She reports that she feels that the TV is making reference to her as well as that there is an outside force that controls her.   While a patient in this hospital, [facility] received mood stabilization treatments. She was medicated and discharged on Abilify 15 mg Q bedtime for mood control, Cogentin 0.5 mg bid for prevention of EPS, Gabapentin 200 mg twice daily for agitation/pain control and Ambien 10 mg Q bedtime for insomnia. She also was resumed on all her pertinent home medications for her other medical issues and or complaints. She tolerated her treatment regimen without any significant adverse effects and or reactions. [name] was enrolled and participated in the group counseling sessions being offered and held on this unit. [name] throughout her stay on the unit was very needy ,attention seeking as well as presented with multiple somatic complaints. Patient was referred to ED for medical clearance prior to dc ,and was cleared ,but patient continued to complain of weakness ,urinary retention and later on incontinence. All her work up were wnl. [name] was also seen to be mixing salt in her drinking water inspite of letting her know she is on Lithium .  [name]'s symptoms responded  to her treatment regimen. This is evidenced by her reports of improved mood, absence of hallucinations and or delusional thinking. She is being discharged to follow-up care on an outpatient basis at the [facility]. She is provided with all the necessary information needed to make this appointment without problems. Upon discharge, she denies any SIHI. She was provided with a 4 days worth, supply samples of her [facility] discharge medications. She left [facility] with all belongings in no distress. Transportation per taxi cab.    Consults:  psychiatry  Significant Diagnostic Studies:  labs: CBC with diff, CMP, UDS, toxicology tests,  U/A  Discharge Vitals:   Blood pressure 113/75, pulse 85, temperature 97.9 F (36.6 C), temperature source Oral, resp. rate 16, height '4\' 10"'  (1.473 m), weight 64.411 kg (142 lb), SpO2 95.00%. Body mass index is 29.69 kg/(m^2). Lab Results:   Results for orders placed during the hospital encounter of 04/12/14 (from the past 72 hour(s))  URINALYSIS W MICROSCOPIC     Status: None   Collection Time    04/21/14  3:45 PM      Result Value Ref Range   Color, Urine YELLOW  YELLOW   APPearance CLEAR  CLEAR   Specific Gravity, Urine 1.007  1.005 - 1.030   pH 6.0  5.0 - 8.0   Glucose, UA NEGATIVE  NEGATIVE mg/dL   Hgb urine dipstick NEGATIVE  NEGATIVE   Bilirubin Urine NEGATIVE  NEGATIVE   Ketones, ur NEGATIVE  NEGATIVE mg/dL   Protein, ur NEGATIVE  NEGATIVE mg/dL   Urobilinogen, UA 0.2  0.0 - 1.0 mg/dL   Nitrite NEGATIVE  NEGATIVE   Leukocytes, UA NEGATIVE  NEGATIVE   Urine-Other       Value: NO FORMED ELEMENTS SEEN ON URINE MICROSCOPIC EXAMINATION   Comment: Performed at [facility]     Status: None   Collection  Time    04/21/14  3:45 PM      Result Value Ref Range   Specimen Description       Value: URINE, RANDOM     Performed at [place] Requests       Value: NONE     Performed at [place] Time       Value: 04/22/2014 01:45     Performed at [place]       Value: NO GROWTH     Performed at [facility]   Culture       Value: NO GROWTH     Performed at [facility]   Report Status 04/23/2014 FINAL    HEMOGLOBIN A1C     Status: Abnormal   Collection Time    04/21/14  7:38 PM      Result Value Ref Range   Hemoglobin A1C 6.0 (*) <5.7 %   Comment: (NOTE)                                                                               According to the ADA Clinical Practice Recommendations for 2011, when     HbA1c is used as a  screening test:      >=6.5%   Diagnostic of Diabetes Mellitus               (if abnormal result is confirmed)     5.7-6.4%   Increased risk of developing Diabetes Mellitus     References:Diagnosis and Classification of Diabetes Mellitus,Diabetes     TTSV,7793,90(ZESPQ 1):S62-S69 and Standards of Medical Care in             Diabetes - 2011,Diabetes Care,2011,34 (Suppl 1):S11-S61.   Mean Plasma Glucose 126 (*) <117 mg/dL   Comment: Performed at [facility]  CBC     Status: None   Collection Time    04/21/14  7:38 PM      Result Value Ref Range   WBC 9.4  4.0 - 10.5 K/uL   RBC 4.55  3.87 - 5.11 MIL/uL   Hemoglobin 13.1  12.0 - 15.0 g/dL   HCT 40.0  36.0 - 46.0 %   MCV 87.9  78.0 - 100.0 fL   MCH 28.8  26.0 - 34.0 pg   MCHC 32.8  30.0 - 36.0 g/dL   RDW 12.8  11.5 - 15.5 %   Platelets 350  150 - 400 K/uL   Comment: Performed at [facility] PANEL     Status: Abnormal   Collection Time    04/21/14  7:38 PM      Result Value Ref Range   Sodium 143  137 - 147 mEq/L   Potassium 4.2  3.7 - 5.3 mEq/L   Chloride 105  96 - 112 mEq/L   CO2 24  19 - 32 mEq/L   Glucose, Bld 141 (*) 70 - 99 mg/dL   BUN 12  6 - 23 mg/dL   Creatinine, Ser 0.65  0.50 - 1.10 mg/dL   Calcium 10.3  8.4 - 10.5  mg/dL   GFR calc non Af Amer >90  >90 mL/min   GFR calc Af Amer >90  >90 mL/min   Comment: (NOTE)     The eGFR has been calculated using the CKD EPI equation.     This calculation has not been validated in all clinical situations.     eGFR's persistently <90 mL/min signify possible Chronic Kidney     Disease.   Anion gap 14  5 - 15   Comment: Performed at [facility]     Status: Abnormal   Collection Time    04/22/14  6:14 AM      Result Value Ref Range   Lithium Lvl 0.27 (*) 0.80 - 1.40 mEq/L   Comment: Performed at [facility] MICROSCOPIC     Status: None   Collection Time     04/23/14  9:41 PM      Result Value Ref Range   Color, Urine YELLOW  YELLOW   APPearance CLEAR  CLEAR   Specific Gravity, Urine 1.012  1.005 - 1.030   pH 6.0  5.0 - 8.0   Glucose, UA NEGATIVE  NEGATIVE mg/dL   Hgb urine dipstick NEGATIVE  NEGATIVE   Bilirubin Urine NEGATIVE  NEGATIVE   Ketones, ur NEGATIVE  NEGATIVE mg/dL   Protein, ur NEGATIVE  NEGATIVE mg/dL   Urobilinogen, UA 0.2  0.0 - 1.0 mg/dL   Nitrite NEGATIVE  NEGATIVE   Leukocytes, UA NEGATIVE  NEGATIVE   Comment: MICROSCOPIC NOT DONE ON URINES WITH NEGATIVE PROTEIN, BLOOD, LEUKOCYTES, NITRITE, OR GLUCOSE <1000 mg/dL.   Physical Findings: AIMS: Facial and Oral Movements Muscles of Facial Expression: None, normal Lips and Perioral Area: None, normal Jaw: None, normal Tongue: None, normal,Extremity Movements Upper (arms, wrists, hands, fingers): None, normal Lower (legs, knees, ankles, toes): None, normal, Trunk Movements Neck, shoulders, hips: None, normal, Overall Severity Severity of abnormal movements (highest score from questions above): None, normal Incapacitation due to abnormal movements: None, normal Patient's awareness of abnormal movements (rate only patient's report): No Awareness, Dental Status Current problems with teeth and/or dentures?: No Does patient usually wear dentures?: No  CIWA:    COWS:     Psychiatric Specialty Exam: See Psychiatric Specialty Exam and Suicide Risk Assessment completed by Attending Physician prior to discharge.  Discharge destination:  Home  Is patient on multiple antipsychotic therapies at discharge:  No   Has Patient had three or more failed trials of antipsychotic monotherapy by history:  No  Recommended Plan for Multiple Antipsychotic Therapies: NA      Discharge Instructions   Discharge instructions    Complete by:  As directed   Take all of your medications as directed. Be sure to keep all of your follow up appointments.  If you are unable to keep your follow  up appointment, call your Doctor's office to let them know, and reschedule.  Make sure that you have enough medication to last until your appointment. Be sure to get plenty of rest. Going to bed at the same time each night will help. Try to avoid sleeping during the day.  Increase your activity as tolerated. Regular exercise will help you to sleep better and improve your mental health. Eating a heart healthy diet is recommended. Try to avoid salty or fried foods. Be sure to avoid all alcohol and illegal drugs.            Medication List  STOP taking these medications       venlafaxine XR 75 MG 24 hr capsule  Commonly known as:  EFFEXOR-XR      TAKE these medications     Indication   ARIPiprazole 15 MG tablet  Commonly known as:  ABILIFY  Take 1 tablet (15 mg total) by mouth at bedtime. For mood control   Indication:  Rapidly Alternating Manic-Depressive Psychosis, Mood control     benztropine 0.5 MG tablet  Commonly known as:  COGENTIN  Take 1 tablet (0.5 mg total) by mouth 2 (two) times daily. For prevention of drug induced involuntary movement   Indication:  Extrapyramidal Reaction caused by Medications     diclofenac sodium 1 % Gel  Commonly known as:  VOLTAREN  Apply 4 g topically 4 (four) times daily. For arthritic pain   Indication:  Joint Damage causing Pain and Loss of Function     DSS 100 MG Caps  Take 100 mg by mouth 2 (two) times daily. (This medicine may be purchased from over the counter at yr local pharmacy): For constipation   Indication:  Constipation     gabapentin 100 MG capsule  Commonly known as:  NEURONTIN  Take 2 capsules (200 mg total) by mouth 2 (two) times daily. For agitation/pain managment   Indication:  Agitation, Pain     ibuprofen 600 MG tablet  Commonly known as:  ADVIL,MOTRIN  Take 1 tablet (600 mg total) by mouth every 4 (four) hours as needed for mild pain or moderate pain.   Indication:  Mild to Moderate Pain     lithium carbonate  300 MG capsule  Take 1 capsule (300 mg total) by mouth at bedtime. For mood stabilization   Indication:  Mood stabilization     zolpidem 10 MG tablet  Commonly known as:  AMBIEN  Take 1 tablet (10 mg total) by mouth at bedtime. For insomnia   Indication:  Trouble Sleeping       Follow-up Information   Follow up with [facility] On 06/08/2014. (for medication management.  You may call on a daily basis to see if you can be fit in due to a cancellation.  In the event that your medication supply is going to run out, you may request a refill through the nubmer listed above. )    Contact information:   [place] [address] [phone]     Follow-up recommendations:  Activity:  As tolerated Diet: As recommended by your primary care doctor. Keep all scheduled follow-up appointments as recommended.  Comments: Take all your medications as prescribed by your mental healthcare provider. Report any adverse effects and or reactions from your medicines to your outpatient provider promptly. Patient is instructed and cautioned to not engage in alcohol and or illegal drug use while on prescription medicines. In the event of worsening symptoms, patient is instructed to call the crisis hotline, 911 and or go to the nearest ED for appropriate evaluation and treatment of symptoms. Follow-up with your primary care provider for your other medical issues, concerns and or health care needs.   Total Discharge Time:  Greater than 30 minutes.  Signed: Encarnacion Slates, [place]  Patient was seen face to face for psychiatric evaluation, suicide risk assessment and case discussed with treatment team and NP and made appropriate disposition plans. Reviewed the information documented and agree with the treatment plan.     Ursula Alert ,MD Attending Psychiatrist  [facility]      04/24/2014, 9:51 AM

## 2014-04-24 NOTE — [facility] Suicide Risk Assessment (Signed)
[facility] INPATIENT:  Family/Significant Other Suicide Prevention Education  Suicide Prevention Education:  Education Completed; No one has been identified by the patient as the family member/significant other with whom the patient will be residing, and identified as the person(s) who will aid the patient in the event of a mental health crisis (suicidal ideations/suicide attempt).  With written consent from the patient, the family member/significant other has been provided the following suicide prevention education, prior to the and/or following the discharge of the patient.  The suicide prevention education provided includes the following:  Suicide risk factors  Suicide prevention and interventions  National Suicide Hotline telephone number  [facility] assessment telephone number  [facility] Emergency Assistance [place] and/or Residential Mobile Crisis Unit telephone number  Request made of family/significant other to:  Remove weapons (e.g., guns, rifles, knives), all items previously/currently identified as safety concern.    Remove drugs/medications (over-the-counter, prescriptions, illicit drugs), all items previously/currently identified as a safety concern.  The family member/significant other verbalizes understanding of the suicide prevention education information provided.  The family member/significant other agrees to remove the items of safety concern listed above. The patient did not endorse SI at the time of admission, nor did the patient c/o SI during the stay here.  SPE not required.   [name] 04/24/2014, 12:33 PM

## 2014-04-24 NOTE — Plan of Care (Signed)
Problem: Diagnosis: Increased Risk For Suicide Attempt Goal: STG-Patient Will Attend All Groups On The Unit Outcome: Not Progressing Pt did not attend evening group on 04/23/14. Goal: STG-Patient Will Report Suicidal Feelings to Staff Outcome: Progressing Pt agreed to notify staff if thinking of harming herself.   Goal: STG-Patient Will Comply With Medication Regime Outcome: Not Progressing Pt continues to refuse certain medications.

## 2014-04-28 NOTE — Progress Notes (Signed)
Patient Discharge Instructions:  After Visit Summary (AVS):   Faxed to:  04/28/14 Discharge Summary Note:   Faxed to:  04/28/14 Psychiatric Admission Assessment Note:   Faxed to:  04/28/14 Suicide Risk Assessment - Discharge Assessment:   Faxed to:  04/28/14 Faxed/Sent to the Next Level Care provider:  04/28/14 Faxed to [facility], 04/28/2014, 3:11 PM

## 2014-04-30 ENCOUNTER — Emergency Department (HOSPITAL_COMMUNITY): Payer: BC Managed Care – PPO

## 2014-04-30 ENCOUNTER — Inpatient Hospital Stay (HOSPITAL_COMMUNITY)
Admission: EM | Admit: 2014-04-30 | Discharge: 2014-05-03 | DRG: 918 | Disposition: A | Discharge status: Other Acute Inpatient Hospital | Payer: BC Managed Care – PPO | Attending: Internal Medicine | Admitting: Internal Medicine

## 2014-04-30 ENCOUNTER — Encounter (HOSPITAL_COMMUNITY): Payer: Self-pay | Admitting: Emergency Medicine

## 2014-04-30 DIAGNOSIS — N6320 Unspecified lump in the left breast, unspecified quadrant: Secondary | ICD-10-CM

## 2014-04-30 DIAGNOSIS — R0789 Other chest pain: Secondary | ICD-10-CM

## 2014-04-30 DIAGNOSIS — T438X4A Poisoning by other psychotropic drugs, undetermined, initial encounter: Secondary | ICD-10-CM | POA: Diagnosis not present

## 2014-04-30 DIAGNOSIS — F3160 Bipolar disorder, current episode mixed, unspecified: Secondary | ICD-10-CM

## 2014-04-30 DIAGNOSIS — R3989 Other symptoms and signs involving the genitourinary system: Secondary | ICD-10-CM

## 2014-04-30 DIAGNOSIS — R0600 Dyspnea, unspecified: Secondary | ICD-10-CM

## 2014-04-30 DIAGNOSIS — N6029 Fibroadenosis of unspecified breast: Secondary | ICD-10-CM

## 2014-04-30 DIAGNOSIS — Z79899 Other long term (current) drug therapy: Secondary | ICD-10-CM

## 2014-04-30 DIAGNOSIS — M6281 Muscle weakness (generalized): Secondary | ICD-10-CM

## 2014-04-30 DIAGNOSIS — K760 Fatty (change of) liver, not elsewhere classified: Secondary | ICD-10-CM

## 2014-04-30 DIAGNOSIS — R1319 Other dysphagia: Secondary | ICD-10-CM

## 2014-04-30 DIAGNOSIS — T56891A Toxic effect of other metals, accidental (unintentional), initial encounter: Secondary | ICD-10-CM

## 2014-04-30 DIAGNOSIS — E860 Dehydration: Secondary | ICD-10-CM

## 2014-04-30 DIAGNOSIS — N200 Calculus of kidney: Secondary | ICD-10-CM

## 2014-04-30 DIAGNOSIS — R202 Paresthesia of skin: Secondary | ICD-10-CM

## 2014-04-30 DIAGNOSIS — R5383 Other fatigue: Secondary | ICD-10-CM

## 2014-04-30 DIAGNOSIS — K589 Irritable bowel syndrome without diarrhea: Secondary | ICD-10-CM | POA: Diagnosis present

## 2014-04-30 DIAGNOSIS — Z87891 Personal history of nicotine dependence: Secondary | ICD-10-CM

## 2014-04-30 DIAGNOSIS — R531 Weakness: Secondary | ICD-10-CM

## 2014-04-30 DIAGNOSIS — R350 Frequency of micturition: Secondary | ICD-10-CM

## 2014-04-30 DIAGNOSIS — R269 Unspecified abnormalities of gait and mobility: Secondary | ICD-10-CM

## 2014-04-30 DIAGNOSIS — G479 Sleep disorder, unspecified: Secondary | ICD-10-CM

## 2014-04-30 DIAGNOSIS — R739 Hyperglycemia, unspecified: Secondary | ICD-10-CM

## 2014-04-30 DIAGNOSIS — R633 Feeding difficulties: Secondary | ICD-10-CM

## 2014-04-30 DIAGNOSIS — T4391XA Poisoning by unspecified psychotropic drug, accidental (unintentional), initial encounter: Secondary | ICD-10-CM | POA: Diagnosis present

## 2014-04-30 DIAGNOSIS — F312 Bipolar disorder, current episode manic severe with psychotic features: Secondary | ICD-10-CM

## 2014-04-30 DIAGNOSIS — K921 Melena: Secondary | ICD-10-CM

## 2014-04-30 DIAGNOSIS — R6884 Jaw pain: Secondary | ICD-10-CM

## 2014-04-30 DIAGNOSIS — F319 Bipolar disorder, unspecified: Secondary | ICD-10-CM

## 2014-04-30 DIAGNOSIS — R195 Other fecal abnormalities: Secondary | ICD-10-CM

## 2014-04-30 DIAGNOSIS — IMO0002 Reserved for concepts with insufficient information to code with codable children: Secondary | ICD-10-CM | POA: Diagnosis not present

## 2014-04-30 DIAGNOSIS — R6339 Other feeding difficulties: Secondary | ICD-10-CM

## 2014-04-30 DIAGNOSIS — R030 Elevated blood-pressure reading, without diagnosis of hypertension: Secondary | ICD-10-CM

## 2014-04-30 DIAGNOSIS — R0609 Other forms of dyspnea: Secondary | ICD-10-CM

## 2014-04-30 DIAGNOSIS — M940 Chondrocostal junction syndrome [Tietze]: Secondary | ICD-10-CM

## 2014-04-30 DIAGNOSIS — M549 Dorsalgia, unspecified: Secondary | ICD-10-CM

## 2014-04-30 DIAGNOSIS — F411 Generalized anxiety disorder: Secondary | ICD-10-CM

## 2014-04-30 DIAGNOSIS — R300 Dysuria: Secondary | ICD-10-CM

## 2014-04-30 DIAGNOSIS — N809 Endometriosis, unspecified: Secondary | ICD-10-CM

## 2014-04-30 DIAGNOSIS — R399 Unspecified symptoms and signs involving the genitourinary system: Secondary | ICD-10-CM

## 2014-04-30 DIAGNOSIS — R209 Unspecified disturbances of skin sensation: Secondary | ICD-10-CM

## 2014-04-30 DIAGNOSIS — R5381 Other malaise: Secondary | ICD-10-CM

## 2014-04-30 DIAGNOSIS — R945 Abnormal results of liver function studies: Secondary | ICD-10-CM

## 2014-04-30 DIAGNOSIS — D72829 Elevated white blood cell count, unspecified: Secondary | ICD-10-CM

## 2014-04-30 DIAGNOSIS — H9201 Otalgia, right ear: Secondary | ICD-10-CM

## 2014-04-30 DIAGNOSIS — E663 Overweight: Secondary | ICD-10-CM

## 2014-04-30 DIAGNOSIS — E559 Vitamin D deficiency, unspecified: Secondary | ICD-10-CM

## 2014-04-30 DIAGNOSIS — T438X1A Poisoning by other psychotropic drugs, accidental (unintentional), initial encounter: Principal | ICD-10-CM | POA: Diagnosis present

## 2014-04-30 DIAGNOSIS — R7989 Other specified abnormal findings of blood chemistry: Secondary | ICD-10-CM

## 2014-04-30 DIAGNOSIS — F609 Personality disorder, unspecified: Secondary | ICD-10-CM | POA: Diagnosis present

## 2014-04-30 DIAGNOSIS — M722 Plantar fascial fibromatosis: Secondary | ICD-10-CM

## 2014-04-30 DIAGNOSIS — R109 Unspecified abdominal pain: Secondary | ICD-10-CM

## 2014-04-30 DIAGNOSIS — R002 Palpitations: Secondary | ICD-10-CM

## 2014-04-30 DIAGNOSIS — F4321 Adjustment disorder with depressed mood: Secondary | ICD-10-CM

## 2014-04-30 DIAGNOSIS — R634 Abnormal weight loss: Secondary | ICD-10-CM

## 2014-04-30 DIAGNOSIS — D171 Benign lipomatous neoplasm of skin and subcutaneous tissue of trunk: Secondary | ICD-10-CM

## 2014-04-30 DIAGNOSIS — J309 Allergic rhinitis, unspecified: Secondary | ICD-10-CM

## 2014-04-30 DIAGNOSIS — R339 Retention of urine, unspecified: Secondary | ICD-10-CM

## 2014-04-30 DIAGNOSIS — T50905A Adverse effect of unspecified drugs, medicaments and biological substances, initial encounter: Secondary | ICD-10-CM

## 2014-04-30 DIAGNOSIS — H532 Diplopia: Secondary | ICD-10-CM

## 2014-04-30 DIAGNOSIS — G4733 Obstructive sleep apnea (adult) (pediatric): Secondary | ICD-10-CM

## 2014-04-30 LAB — URINALYSIS, ROUTINE W REFLEX MICROSCOPIC
BILIRUBIN URINE: NEGATIVE
Glucose, UA: NEGATIVE mg/dL
HGB URINE DIPSTICK: NEGATIVE
Ketones, ur: NEGATIVE mg/dL
Nitrite: NEGATIVE
PROTEIN: NEGATIVE mg/dL
Specific Gravity, Urine: 1.014 (ref 1.005–1.030)
UROBILINOGEN UA: 0.2 mg/dL (ref 0.0–1.0)
pH: 7.5 (ref 5.0–8.0)

## 2014-04-30 LAB — CBC
HCT: 39.8 % (ref 36.0–46.0)
Hemoglobin: 12.9 g/dL (ref 12.0–15.0)
MCH: 28.4 pg (ref 26.0–34.0)
MCHC: 32.4 g/dL (ref 30.0–36.0)
MCV: 87.7 fL (ref 78.0–100.0)
PLATELETS: 359 10*3/uL (ref 150–400)
RBC: 4.54 MIL/uL (ref 3.87–5.11)
RDW: 12.6 % (ref 11.5–15.5)
WBC: 11.7 10*3/uL — ABNORMAL HIGH (ref 4.0–10.5)

## 2014-04-30 LAB — BASIC METABOLIC PANEL
ANION GAP: 13 (ref 5–15)
BUN: 14 mg/dL (ref 6–23)
CALCIUM: 10 mg/dL (ref 8.4–10.5)
CHLORIDE: 97 meq/L (ref 96–112)
CO2: 23 mEq/L (ref 19–32)
CREATININE: 0.67 mg/dL (ref 0.50–1.10)
GFR calc Af Amer: >90 mL/min (ref >90)
GFR calc non Af Amer: >90 mL/min (ref >90)
Glucose, Bld: 110 mg/dL — ABNORMAL HIGH (ref 70–99)
Potassium: 4.1 mEq/L (ref 3.7–5.3)
Sodium: 133 mEq/L — ABNORMAL LOW (ref 137–147)

## 2014-04-30 LAB — LIPASE, BLOOD: Lipase: 22 U/L (ref 11–59)

## 2014-04-30 LAB — URINE MICROSCOPIC-ADD ON

## 2014-04-30 LAB — LITHIUM LEVEL: Lithium Lvl: 1.55 mEq/L (ref 0.80–1.40)

## 2014-04-30 NOTE — ED Notes (Signed)
Bladder scan complete at this time 28 ml noted.

## 2014-04-30 NOTE — ED Notes (Signed)
Pt presents via ems with multiple complaints, the most prominent being difficulty emptying her bladder and weakness. VSS. Was seen at [facility] last week. Alert, oriented, and ambulatory.

## 2014-04-30 NOTE — ED Provider Notes (Signed)
[name]: [id number]     Arrival date & time 04/30/14  2121 History   First MD Initiated Contact with Patient 04/30/14 2214     Chief Complaint  Patient presents with  . Urinary Retention  . Altered Mental Status  . Chest Pain    HPI Patient presents to the emergency room with a few complaints. Patient has a history of bipolar disorder with psychosis. [facility]. The patient states she's had a sensation of urinary frequency and urgency. She also feels like she is not emptying her bladder properly. She denies any nausea or vomiting. She has not had any fever or constipation or diarrhea.  She's also had some chest discomfort that is constant and aching for the last several days. She has had an occasional cough. She denies feeling short of breath.  Patient is also worried that she is feeling confused. She states she's not able to concentrate properly.  She did have her medications adjusted recently and was started on lithium when she was in the hospital Past Medical History  Diagnosis Date  . Headache(784.0)   . IBS (irritable bowel syndrome)   . Bipolar 1 disorder     HX PSYCHOSIS  . Personality disorder     W/ BORDERLINE FEATURES  . History of duodenal ulcer   . Pelvic pain   . Complication of anesthesia     POST AGGITATION  . Frequency of urination   . Urgency of urination   . Nocturia    Past Surgical History  Procedure Laterality Date  . Laparoscopy N/A 12/14/2012    Procedure: LAPAROSCOPY DIAGNOSTIC;  Surgeon: Stark Klein, MD;  Location: [facility];  Service: General;  Laterality: N/A;  . Laparoscopic left salpingoophorectomy and lysys adhesions  03-10-2002  . Cardiovascular stress test  01-23-2011    NORMAL NUCLEAR STUDY/ NO ISCHEMIA/ EF 81%  . Transthoracic echocardiogram  10-13-2010    NORMAL LVF/  EF 55-60%  . Laparoscopic cholecystectomy  11-08-2001  . Cardiac catheterization  10-09-1999  DR KATZ    NORMAL LVF/  NORMAL RCA/  NO  CRITICAL DISEASE LEFT CORONARY SYSTEM  . Laparoscopic removal ovary remnant  2003   [place]  . Total abdominal hysterectomy  2000    W/ RIGHT SALPINGOOPHORECTOMY  . Cystoscopy with biopsy N/A 06/03/2013    Procedure: CYSTOSCOPY WITH BIOPSY  INSTILLATION OF MARCAINE AND PYRIDIUM;  Surgeon: Hanley Ben, MD;  Location: [facility];  Service: Urology;  Laterality: N/A;   Family History  Problem Relation Age of Onset  . Sleep apnea Father   . Migraines Sister     headaches  . Other Mother     MAC infection   History  Substance Use Topics  . Smoking status: Former Smoker -- 0.01 packs/day for 1 years    Types: Cigarettes    Quit date: 08/18/2009  . Smokeless tobacco: Never Used     Comment: ONLY SMOKED FOR 6 MONTHS --  QUIT  YRS AGO  . Alcohol Use: 1.8 oz/week    3 Cans of beer per week     Comment: socially    OB History   Grav Para Term Preterm Abortions TAB SAB Ect Mult Living                 Review of Systems  All other systems reviewed and are negative.     Allergies  Morphine and related  Home Medications   Prior to Admission medications  Medication Sig Start Date End Date Taking? Authorizing Provider  ARIPiprazole (ABILIFY) 15 MG tablet Take 15 mg by mouth daily.   Yes Historical Provider, MD  benztropine (COGENTIN) 0.5 MG tablet Take 1 tablet (0.5 mg total) by mouth 2 (two) times daily. For prevention of drug induced involuntary movement 04/24/14  Yes Encarnacion Slates, NP  diphenhydrAMINE (BENADRYL) 25 MG tablet Take 25 mg by mouth every 6 (six) hours as needed for itching, allergies or sleep.   Yes Historical Provider, MD  docusate sodium 100 MG CAPS Take 100 mg by mouth 2 (two) times daily. (This medicine may be purchased from over the counter at yr local pharmacy): For constipation 04/24/14  Yes Encarnacion Slates, NP  FLUoxetine (PROZAC) 20 MG tablet Take 20 mg by mouth daily.  04/30/14  Yes Historical Provider, MD  gabapentin (NEURONTIN) 100 MG  capsule Take 2 capsules (200 mg total) by mouth 2 (two) times daily. For agitation/pain managment 04/24/14  Yes Encarnacion Slates, NP  ibuprofen (ADVIL,MOTRIN) 600 MG tablet Take 1 tablet (600 mg total) by mouth every 4 (four) hours as needed for mild pain or moderate pain. 04/24/14  Yes Encarnacion Slates, NP  lithium carbonate (ESKALITH) 450 MG CR tablet Take 900 mg by mouth 2 (two) times daily.   Yes Historical Provider, MD  Multiple Vitamin (MULTIVITAMIN WITH MINERALS) TABS tablet Take 1 tablet by mouth daily.   Yes Historical Provider, MD  venlafaxine XR (EFFEXOR-XR) 75 MG 24 hr capsule Take 75 mg by mouth daily with breakfast.   Yes Historical Provider, MD  zolpidem (AMBIEN) 10 MG tablet Take 10 mg by mouth at bedtime.   Yes Historical Provider, MD   BP 126/74  Temp(Src) 97.8 F (36.6 C) (Oral)  Resp 17  Ht 5' (1.524 m)  Wt 150 lb (68.04 kg)  BMI 29.30 kg/m2  SpO2 94% Physical Exam  Nursing note and vitals reviewed. Constitutional: No distress.  HENT:  Head: Normocephalic and atraumatic.  Right Ear: External ear normal.  Left Ear: External ear normal.  Eyes: Conjunctivae are normal. Right eye exhibits no discharge. Left eye exhibits no discharge. No scleral icterus.  Neck: Neck supple. No tracheal deviation present.  Cardiovascular: Normal rate, regular rhythm and intact distal pulses.   Pulmonary/Chest: Effort normal and breath sounds normal. No stridor. No respiratory distress. She has no wheezes. She has no rales.  Abdominal: Soft. Bowel sounds are normal. She exhibits no distension. There is no tenderness. There is no rebound and no guarding.  Musculoskeletal: She exhibits no edema and no tenderness.  Neurological: She is alert. She has normal strength. No cranial nerve deficit (no facial droop, extraocular movements intact, no slurred speech) or sensory deficit. She exhibits normal muscle tone. She displays no seizure activity. Coordination normal. GCS eye subscore is 4. GCS verbal subscore  is 5. GCS motor subscore is 6.  Skin: Skin is warm and dry. No rash noted. She is not diaphoretic.  Psychiatric: She has a normal mood and affect.    ED Course  Procedures (including critical care time) CRITICAL CARE Performed by: YNWGN,FAO Total critical care time: 35Critical care time was exclusive of separately billable procedures and treating other patients. Critical care was necessary to treat or prevent imminent or life-threatening deterioration. Critical care was time spent personally by me on the following activities: development of treatment plan with patient and/or surrogate as well as nursing, discussions with consultants, evaluation of patient's response to treatment, examination of patient, obtaining history  from patient or surrogate, ordering and performing treatments and interventions, ordering and review of laboratory studies, ordering and review of radiographic studies, pulse oximetry and re-evaluation of patient's condition.  Labs Review Labs Reviewed  CBC - Abnormal; Notable for the following:    WBC 11.7 (*)    All other components within normal limits  BASIC METABOLIC PANEL - Abnormal; Notable for the following:    Sodium 133 (*)    Glucose, Bld 110 (*)    All other components within normal limits  URINALYSIS, ROUTINE W REFLEX MICROSCOPIC - Abnormal; Notable for the following:    Leukocytes, UA TRACE (*)    All other components within normal limits  URINE MICROSCOPIC-ADD ON - Abnormal; Notable for the following:    Squamous Epithelial / LPF FEW (*)    All other components within normal limits  LITHIUM LEVEL - Abnormal; Notable for the following:    Lithium Lvl 1.55 (*)    All other components within normal limits  LIPASE, BLOOD  I-STAT TROPOININ, ED    Imaging Review Dg Chest Port 1 View  04/30/2014   CLINICAL DATA:  Chest pain  EXAM: PORTABLE CHEST - 1 VIEW  COMPARISON:  Prior radiograph from 01/19/2014  FINDINGS: The cardiac and mediastinal silhouettes are  stable in size and contour, and remain within normal limits.  The lungs are normally inflated. No airspace consolidation, pleural effusion, or pulmonary edema is identified. There is no pneumothorax.  No acute osseous abnormality identified.  IMPRESSION: No active cardiopulmonary disease.   Electronically Signed   By: Jeannine Boga M.D.   On: 04/30/2014 22:52     EKG Interpretation   Date/Time:  Sunday April 30 2014 21:33:48 EDT Ventricular Rate:  106 PR Interval:  126 QRS Duration: 75 QT Interval:  341 QTC Calculation: 453 R Axis:   59 Text Interpretation:  Sinus tachycardia Low voltage, precordial leads No  significant change since last tracing Confirmed by Atalia Litzinger  MD-J, Magenta Schmiesing  ([id number]) on 04/30/2014 9:56:32 PM      MDM   Final diagnoses:  Lithium intoxication, accidental or unintentional, initial encounter    Pt's lithium level is elevated.  It could be contributing to her symptoms.  No seizures.  Not altered on my exam.  No indication for dialysis.  Will continue with iv fluid hydration and monitoring.    Dorie Rank, MD 05/01/14 0000

## 2014-04-30 NOTE — ED Notes (Addendum)
Pt states that she was in the hospital "about a week ago." Pt states that she is confused since leaving the hospital. Pt also states that she is having trouble urinating and feels swollen in her abdomen. Pt also c/o 8/10 chest pain.

## 2014-05-01 ENCOUNTER — Encounter (HOSPITAL_COMMUNITY): Payer: Self-pay | Admitting: Internal Medicine

## 2014-05-01 DIAGNOSIS — R0789 Other chest pain: Secondary | ICD-10-CM

## 2014-05-01 DIAGNOSIS — T56891A Toxic effect of other metals, accidental (unintentional), initial encounter: Secondary | ICD-10-CM

## 2014-05-01 DIAGNOSIS — K589 Irritable bowel syndrome without diarrhea: Secondary | ICD-10-CM | POA: Diagnosis present

## 2014-05-01 DIAGNOSIS — T438X1A Poisoning by other psychotropic drugs, accidental (unintentional), initial encounter: Secondary | ICD-10-CM | POA: Diagnosis present

## 2014-05-01 DIAGNOSIS — E860 Dehydration: Secondary | ICD-10-CM | POA: Diagnosis present

## 2014-05-01 DIAGNOSIS — IMO0002 Reserved for concepts with insufficient information to code with codable children: Secondary | ICD-10-CM | POA: Diagnosis present

## 2014-05-01 DIAGNOSIS — T5691XA Toxic effect of unspecified metal, accidental (unintentional), initial encounter: Secondary | ICD-10-CM

## 2014-05-01 DIAGNOSIS — D72829 Elevated white blood cell count, unspecified: Secondary | ICD-10-CM

## 2014-05-01 DIAGNOSIS — F319 Bipolar disorder, unspecified: Secondary | ICD-10-CM | POA: Diagnosis present

## 2014-05-01 DIAGNOSIS — F609 Personality disorder, unspecified: Secondary | ICD-10-CM | POA: Diagnosis present

## 2014-05-01 DIAGNOSIS — Z87891 Personal history of nicotine dependence: Secondary | ICD-10-CM | POA: Diagnosis not present

## 2014-05-01 DIAGNOSIS — R404 Transient alteration of awareness: Secondary | ICD-10-CM

## 2014-05-01 DIAGNOSIS — T4391XA Poisoning by unspecified psychotropic drug, accidental (unintentional), initial encounter: Secondary | ICD-10-CM | POA: Diagnosis present

## 2014-05-01 DIAGNOSIS — T56894A Toxic effect of other metals, undetermined, initial encounter: Secondary | ICD-10-CM

## 2014-05-01 DIAGNOSIS — T438X4A Poisoning by other psychotropic drugs, undetermined, initial encounter: Secondary | ICD-10-CM | POA: Diagnosis present

## 2014-05-01 DIAGNOSIS — Z79899 Other long term (current) drug therapy: Secondary | ICD-10-CM | POA: Diagnosis not present

## 2014-05-01 HISTORY — DX: Elevated white blood cell count, unspecified: D72.829

## 2014-05-01 HISTORY — DX: Toxic effect of other metals, accidental (unintentional), initial encounter: T56.891A

## 2014-05-01 LAB — CBC
HEMATOCRIT: 39.3 % (ref 36.0–46.0)
Hemoglobin: 12.7 g/dL (ref 12.0–15.0)
MCH: 29 pg (ref 26.0–34.0)
MCHC: 32.3 g/dL (ref 30.0–36.0)
MCV: 89.7 fL (ref 78.0–100.0)
Platelets: 330 10*3/uL (ref 150–400)
RBC: 4.38 MIL/uL (ref 3.87–5.11)
RDW: 12.7 % (ref 11.5–15.5)
WBC: 10.5 10*3/uL (ref 4.0–10.5)

## 2014-05-01 LAB — BASIC METABOLIC PANEL
ANION GAP: 12 (ref 5–15)
BUN: 11 mg/dL (ref 6–23)
CO2: 20 meq/L (ref 19–32)
CREATININE: 0.65 mg/dL (ref 0.50–1.10)
Calcium: 9.7 mg/dL (ref 8.4–10.5)
Chloride: 106 mEq/L (ref 96–112)
GFR calc Af Amer: >90 mL/min (ref >90)
GFR calc non Af Amer: >90 mL/min (ref >90)
Glucose, Bld: 106 mg/dL — ABNORMAL HIGH (ref 70–99)
Potassium: 4.2 mEq/L (ref 3.7–5.3)
Sodium: 138 mEq/L (ref 137–147)

## 2014-05-01 LAB — COMPREHENSIVE METABOLIC PANEL
ALK PHOS: 98 U/L (ref 39–117)
ALT: 60 U/L — AB (ref 0–35)
AST: 39 U/L — ABNORMAL HIGH (ref 0–37)
Albumin: 3.8 g/dL (ref 3.5–5.2)
Anion gap: 11 (ref 5–15)
BILIRUBIN TOTAL: 0.9 mg/dL (ref 0.3–1.2)
BUN: 12 mg/dL (ref 6–23)
CHLORIDE: 105 meq/L (ref 96–112)
CO2: 22 meq/L (ref 19–32)
Calcium: 9.4 mg/dL (ref 8.4–10.5)
Creatinine, Ser: 0.66 mg/dL (ref 0.50–1.10)
GFR calc Af Amer: >90 mL/min (ref >90)
GFR calc non Af Amer: >90 mL/min (ref >90)
Glucose, Bld: 100 mg/dL — ABNORMAL HIGH (ref 70–99)
Potassium: 3.9 mEq/L (ref 3.7–5.3)
SODIUM: 138 meq/L (ref 137–147)
Total Protein: 7.1 g/dL (ref 6.0–8.3)

## 2014-05-01 LAB — URINALYSIS, ROUTINE W REFLEX MICROSCOPIC
BILIRUBIN URINE: NEGATIVE
Glucose, UA: NEGATIVE mg/dL
Hgb urine dipstick: NEGATIVE
Ketones, ur: NEGATIVE mg/dL
LEUKOCYTES UA: NEGATIVE
NITRITE: NEGATIVE
PH: 7 (ref 5.0–8.0)
Protein, ur: NEGATIVE mg/dL
SPECIFIC GRAVITY, URINE: 1.005 (ref 1.005–1.030)
Urobilinogen, UA: 0.2 mg/dL (ref 0.0–1.0)

## 2014-05-01 LAB — PHOSPHORUS: Phosphorus: 2.8 mg/dL (ref 2.3–4.6)

## 2014-05-01 LAB — TROPONIN I
Troponin I: <0.3 ng/mL (ref <0.30)
Troponin I: <0.3 ng/mL (ref <0.30)

## 2014-05-01 LAB — TSH: TSH: 2.78 u[IU]/mL (ref 0.350–4.500)

## 2014-05-01 LAB — MAGNESIUM: Magnesium: 2.3 mg/dL (ref 1.5–2.5)

## 2014-05-01 LAB — LITHIUM LEVEL: Lithium Lvl: 1.16 mEq/L (ref 0.80–1.40)

## 2014-05-01 MED ORDER — SODIUM CHLORIDE 0.9 % IJ SOLN
3.0000 mL | Freq: Two times a day (BID) | INTRAMUSCULAR | Status: DC
  Administered 2014-05-01: 3 mL via INTRAVENOUS

## 2014-05-01 MED ORDER — HALOPERIDOL LACTATE 5 MG/ML IJ SOLN
5.0000 mg | Freq: Once | INTRAMUSCULAR | Status: AC
  Administered 2014-05-01: 5 mg via INTRAVENOUS
  Filled 2014-05-01: qty 1

## 2014-05-01 MED ORDER — DOCUSATE SODIUM 100 MG PO CAPS: 100.0000 mg | ORAL_CAPSULE | Freq: Two times a day (BID) | ORAL | Status: DC

## 2014-05-01 MED ORDER — ARIPIPRAZOLE 15 MG PO TABS
15.0000 mg | ORAL_TABLET | Freq: Every day | ORAL | Status: DC
  Filled 2014-05-01 (×2): qty 1

## 2014-05-01 MED ORDER — SODIUM CHLORIDE 0.9 % IV SOLN
1000.0000 mL | Freq: Once | INTRAVENOUS | Status: AC
  Administered 2014-05-01: 1000 mL via INTRAVENOUS

## 2014-05-01 MED ORDER — VENLAFAXINE HCL ER 75 MG PO CP24
75.0000 mg | ORAL_CAPSULE | Freq: Every day | ORAL | Status: DC
  Filled 2014-05-01 (×2): qty 1

## 2014-05-01 MED ORDER — ACETAMINOPHEN 650 MG RE SUPP: 650.0000 mg | Freq: Four times a day (QID) | RECTAL | Status: DC | PRN

## 2014-05-01 MED ORDER — BENZTROPINE MESYLATE 0.5 MG PO TABS
0.5000 mg | ORAL_TABLET | Freq: Two times a day (BID) | ORAL | Status: DC
  Administered 2014-05-01: 0.5 mg via ORAL
  Filled 2014-05-01 (×4): qty 1

## 2014-05-01 MED ORDER — LORAZEPAM 2 MG/ML IJ SOLN
INTRAMUSCULAR | Status: AC
  Administered 2014-05-01: 2 mg via INTRAVENOUS
  Filled 2014-05-01: qty 1

## 2014-05-01 MED ORDER — DOCUSATE SODIUM 100 MG PO CAPS
100.0000 mg | ORAL_CAPSULE | Freq: Two times a day (BID) | ORAL | Status: DC
  Administered 2014-05-01: 100 mg via ORAL
  Filled 2014-05-01 (×6): qty 1

## 2014-05-01 MED ORDER — ZOLPIDEM TARTRATE 5 MG PO TABS
5.0000 mg | ORAL_TABLET | Freq: Every day | ORAL | Status: DC
  Administered 2014-05-01: 5 mg via ORAL
  Filled 2014-05-01: qty 1

## 2014-05-01 MED ORDER — GABAPENTIN 100 MG PO CAPS
200.0000 mg | ORAL_CAPSULE | Freq: Two times a day (BID) | ORAL | Status: DC
  Administered 2014-05-01 – 2014-05-03 (×4): 200 mg via ORAL
  Filled 2014-05-01 (×6): qty 2

## 2014-05-01 MED ORDER — ASPIRIN EC 81 MG PO TBEC
81.0000 mg | DELAYED_RELEASE_TABLET | Freq: Every day | ORAL | Status: DC
  Administered 2014-05-02 – 2014-05-03 (×2): 81 mg via ORAL
  Filled 2014-05-01 (×3): qty 1

## 2014-05-01 MED ORDER — ENOXAPARIN SODIUM 40 MG/0.4ML ~~LOC~~ SOLN
40.0000 mg | SUBCUTANEOUS | Status: DC
  Filled 2014-05-01 (×3): qty 0.4

## 2014-05-01 MED ORDER — HYDROCODONE-ACETAMINOPHEN 5-325 MG PO TABS
1.0000 | ORAL_TABLET | ORAL | Status: DC | PRN
  Filled 2014-05-01: qty 1

## 2014-05-01 MED ORDER — FLUOXETINE HCL 20 MG PO CAPS
20.0000 mg | ORAL_CAPSULE | Freq: Every day | ORAL | Status: DC
Start: 2014-05-01 — End: 2014-05-02
  Filled 2014-05-01 (×2): qty 1

## 2014-05-01 MED ORDER — ACETAMINOPHEN 325 MG PO TABS: 650.0000 mg | ORAL_TABLET | Freq: Four times a day (QID) | ORAL | Status: DC | PRN

## 2014-05-01 MED ORDER — ZOLPIDEM TARTRATE 10 MG PO TABS: 10.0000 mg | ORAL_TABLET | Freq: Every day | ORAL | Status: DC

## 2014-05-01 MED ORDER — SODIUM CHLORIDE 0.9 % IV SOLN: 1000.0000 mL | INTRAVENOUS | Status: DC

## 2014-05-01 MED ORDER — LORAZEPAM 2 MG/ML IJ SOLN
2.0000 mg | Freq: Once | INTRAMUSCULAR | Status: AC
  Administered 2014-05-01: 2 mg via INTRAVENOUS

## 2014-05-01 NOTE — Progress Notes (Signed)
Patient initially more calm after dose of IV Haldol.  Patient now becoming more agitated, and adamantly refusing to cooperate with medical interventions.  Patient being verbally abusive with staff, telling charge nurse "I hope the devil eats your soul."  Patient refusing to plug in IV pump, which was at less than 5 minutes remaining on battery.  Patient at high risk of falling due to sedative medications, and would not let staff help to safe location.  MD notified, and order for Ativan, and restraints initiated.  Less restrictive measures were tried, but patient remains uncooperative and beligerant with staff.  Ongoing need of restraints will continue to be monitored.  Durwin Nora RN

## 2014-05-01 NOTE — Progress Notes (Signed)
3 attempts made to get report on pt from ED.

## 2014-05-01 NOTE — Consult Note (Signed)
[facility] Face-to-Face Psychiatry Consult   Reason for Consult: Help with medication management Referring Physician:  Dr. Arlice Colt Drennen is an 53 y.o. female. Total Time spent with patient: 30 minutes  Assessment: AXIS I:  Bipolar Disorder NOS,  Delirium NOS ( consider secondary to [name])  AXIS II:  Deferred AXIS III:   Past Medical History  Diagnosis Date  . Headache(784.0)   . IBS (irritable bowel syndrome)   . Bipolar 1 disorder     HX PSYCHOSIS  . Personality disorder     W/ BORDERLINE FEATURES  . History of duodenal ulcer   . Pelvic pain   . Complication of anesthesia     POST AGGITATION  . Frequency of urination   . Urgency of urination   . Nocturia    AXIS IV:  none currently endorsed by patient AXIS V:  31-40 impairment in reality testing  Plan:  See below  Subjective:   [name] is a 53 y.o. female patient admitted with  Vague urinary symptoms, confusion, elevated lithium level  HPI:   Patient is a 53 year old female, has a history of chronic mental illness and has been diagnosed with Bipolar Disorder. She had  A recent psychiatric admission at [facility] from 8/26 through 9/5. At the time  She presented with bizarre, agitated behaviors, psychosis, with bizarre delusions of having an outside force controlling her and being able to sense  Things before they happen and read people's minds. She was stabilized and discharged on Abilify, Neurontin, Lithium, Ambien. Reportedly, she had stopped all meds except for lithium and had been taking higher doses than prescribed. Admit Li level 1.55. On medical  unit she has been intermittently agitated and bizarre. She has been refusing medications at times, and has been intermittently loud and angry. At this time patient presents drowsy, slowed, and disorganized in thought process and content. Denies hallucinations, but makes statements such as " there is a fire in the ocean", " humor me" .  She is confused and states she is at  [facility] , and is not aware of day of week, month ( said May) , did state 2015.     HPI Elements:   Severe decompensation in the context of medication non compliance and chronic mental illness   Past Psychiatric History: Past Medical History  Diagnosis Date  . Headache(784.0)   . IBS (irritable bowel syndrome)   . Bipolar 1 disorder     HX PSYCHOSIS  . Personality disorder     W/ BORDERLINE FEATURES  . History of duodenal ulcer   . Pelvic pain   . Complication of anesthesia     POST AGGITATION  . Frequency of urination   . Urgency of urination   . Nocturia     reports that she quit smoking about 4 years ago. Her smoking use included Cigarettes. She has a .01 pack-year smoking history. She has never used smokeless tobacco. She reports that she drinks about 1.8 ounces of alcohol per week. She reports that she does not use illicit drugs. Family History  Problem Relation Age of Onset  . Sleep apnea Father   . Migraines Sister     headaches  . Other Mother     MAC infection     Living Arrangements: Alone   Abuse/Neglect [facility]) Physical Abuse: Denies Verbal Abuse: Denies Sexual Abuse: Denies Allergies:   Allergies  Allergen Reactions  . Morphine And Related Nausea And Vomiting     Objective:  Blood pressure 119/74, pulse 89, temperature 98.1 F (36.7 C), temperature source Axillary, resp. rate 20, height 5' (1.524 m), weight 68.04 kg (150 lb), SpO2 99.00%.Body mass index is 29.3 kg/(m^2). Results for orders placed during the hospital encounter of 04/30/14 (from the past 72 hour(s))  URINALYSIS, ROUTINE W REFLEX MICROSCOPIC     Status: Abnormal   Collection Time    04/30/14  9:55 PM      Result Value Ref Range   Color, Urine YELLOW  YELLOW   APPearance CLEAR  CLEAR   Specific Gravity, Urine 1.014  1.005 - 1.030   pH 7.5  5.0 - 8.0   Glucose, UA NEGATIVE  NEGATIVE mg/dL   Hgb urine dipstick NEGATIVE  NEGATIVE   Bilirubin Urine NEGATIVE  NEGATIVE   Ketones, ur NEGATIVE   NEGATIVE mg/dL   Protein, ur NEGATIVE  NEGATIVE mg/dL   Urobilinogen, UA 0.2  0.0 - 1.0 mg/dL   Nitrite NEGATIVE  NEGATIVE   Leukocytes, UA TRACE (*) NEGATIVE  URINE MICROSCOPIC-ADD ON     Status: Abnormal   Collection Time    04/30/14  9:55 PM      Result Value Ref Range   Squamous Epithelial / LPF FEW (*) RARE   WBC, UA 3-6  <3 WBC/hpf   Bacteria, UA RARE  RARE  CBC     Status: Abnormal   Collection Time    04/30/14 10:16 PM      Result Value Ref Range   WBC 11.7 (*) 4.0 - 10.5 K/uL   RBC 4.54  3.87 - 5.11 MIL/uL   Hemoglobin 12.9  12.0 - 15.0 g/dL   HCT 39.8  36.0 - 46.0 %   MCV 87.7  78.0 - 100.0 fL   MCH 28.4  26.0 - 34.0 pg   MCHC 32.4  30.0 - 36.0 g/dL   RDW 12.6  11.5 - 15.5 %   Platelets 359  150 - 400 K/uL  BASIC METABOLIC PANEL     Status: Abnormal   Collection Time    04/30/14 10:16 PM      Result Value Ref Range   Sodium 133 (*) 137 - 147 mEq/L   Potassium 4.1  3.7 - 5.3 mEq/L   Chloride 97  96 - 112 mEq/L   CO2 23  19 - 32 mEq/L   Glucose, Bld 110 (*) 70 - 99 mg/dL   BUN 14  6 - 23 mg/dL   Creatinine, Ser 0.67  0.50 - 1.10 mg/dL   Calcium 10.0  8.4 - 10.5 mg/dL   GFR calc non Af Amer >90  >90 mL/min   GFR calc Af Amer >90  >90 mL/min   Comment: (NOTE)     The eGFR has been calculated using the CKD EPI equation.     This calculation has not been validated in all clinical situations.     eGFR's persistently <90 mL/min signify possible Chronic Kidney     Disease.   Anion gap 13  5 - 15  LIPASE, BLOOD     Status: None   Collection Time    04/30/14 10:16 PM      Result Value Ref Range   Lipase 22  11 - 59 U/L  LITHIUM LEVEL     Status: Abnormal   Collection Time    04/30/14 11:07 PM      Result Value Ref Range   Lithium Lvl 1.55 (*) 0.80 - 1.40 mEq/L   Comment: CRITICAL RESULT CALLED TO, READ  BACK BY AND VERIFIED WITH:     SPOKE WITH DUDLEY,F RN [place]     Status: None   Collection Time    05/01/14  3:05 AM      Result  Value Ref Range   Magnesium 2.3  1.5 - 2.5 mg/dL  PHOSPHORUS     Status: None   Collection Time    05/01/14  3:05 AM      Result Value Ref Range   Phosphorus 2.8  2.3 - 4.6 mg/dL  TSH     Status: None   Collection Time    05/01/14  3:05 AM      Result Value Ref Range   TSH 2.780  0.350 - 4.500 uIU/mL   Comment: Performed at [facility] PANEL     Status: Abnormal   Collection Time    05/01/14  3:05 AM      Result Value Ref Range   Sodium 138  137 - 147 mEq/L   Potassium 3.9  3.7 - 5.3 mEq/L   Chloride 105  96 - 112 mEq/L   Comment: DELTA CHECK NOTED     REPEATED TO VERIFY   CO2 22  19 - 32 mEq/L   Glucose, Bld 100 (*) 70 - 99 mg/dL   BUN 12  6 - 23 mg/dL   Creatinine, Ser 0.66  0.50 - 1.10 mg/dL   Calcium 9.4  8.4 - 10.5 mg/dL   Total Protein 7.1  6.0 - 8.3 g/dL   Albumin 3.8  3.5 - 5.2 g/dL   AST 39 (*) 0 - 37 U/L   ALT 60 (*) 0 - 35 U/L   Alkaline Phosphatase 98  39 - 117 U/L   Total Bilirubin 0.9  0.3 - 1.2 mg/dL   GFR calc non Af Amer >90  >90 mL/min   GFR calc Af Amer >90  >90 mL/min   Comment: (NOTE)     The eGFR has been calculated using the CKD EPI equation.     This calculation has not been validated in all clinical situations.     eGFR's persistently <90 mL/min signify possible Chronic Kidney     Disease.   Anion gap 11  5 - 15  CBC     Status: None   Collection Time    05/01/14  3:05 AM      Result Value Ref Range   WBC 10.5  4.0 - 10.5 K/uL   RBC 4.38  3.87 - 5.11 MIL/uL   Hemoglobin 12.7  12.0 - 15.0 g/dL   HCT 39.3  36.0 - 46.0 %   MCV 89.7  78.0 - 100.0 fL   MCH 29.0  26.0 - 34.0 pg   MCHC 32.3  30.0 - 36.0 g/dL   RDW 12.7  11.5 - 15.5 %   Platelets 330  150 - 400 K/uL  TROPONIN I     Status: None   Collection Time    05/01/14  3:05 AM      Result Value Ref Range   Troponin I <0.30  <0.30 ng/mL   Comment:            Due to the release kinetics of cTnI,     a negative result within the first hours     of the  onset of symptoms does not rule out     myocardial infarction with certainty.     If myocardial infarction is still suspected,  repeat the test at appropriate intervals.  LITHIUM LEVEL     Status: None   Collection Time    05/01/14  3:05 AM      Result Value Ref Range   Lithium Lvl 1.16  0.80 - 1.40 mEq/L  URINALYSIS, ROUTINE W REFLEX MICROSCOPIC     Status: None   Collection Time    05/01/14  4:14 AM      Result Value Ref Range   Color, Urine YELLOW  YELLOW   APPearance CLEAR  CLEAR   Specific Gravity, Urine 1.005  1.005 - 1.030   pH 7.0  5.0 - 8.0   Glucose, UA NEGATIVE  NEGATIVE mg/dL   Hgb urine dipstick NEGATIVE  NEGATIVE   Bilirubin Urine NEGATIVE  NEGATIVE   Ketones, ur NEGATIVE  NEGATIVE mg/dL   Protein, ur NEGATIVE  NEGATIVE mg/dL   Urobilinogen, UA 0.2  0.0 - 1.0 mg/dL   Nitrite NEGATIVE  NEGATIVE   Leukocytes, UA NEGATIVE  NEGATIVE   Comment: MICROSCOPIC NOT DONE ON URINES WITH NEGATIVE PROTEIN, BLOOD, LEUKOCYTES, NITRITE, OR GLUCOSE <1000 mg/dL.  BASIC METABOLIC PANEL     Status: Abnormal   Collection Time    05/01/14 11:34 AM      Result Value Ref Range   Sodium 138  137 - 147 mEq/L   Potassium 4.2  3.7 - 5.3 mEq/L   Chloride 106  96 - 112 mEq/L   CO2 20  19 - 32 mEq/L   Glucose, Bld 106 (*) 70 - 99 mg/dL   BUN 11  6 - 23 mg/dL   Creatinine, Ser 0.65  0.50 - 1.10 mg/dL   Calcium 9.7  8.4 - 10.5 mg/dL   GFR calc non Af Amer >90  >90 mL/min   GFR calc Af Amer >90  >90 mL/min   Comment: (NOTE)     The eGFR has been calculated using the CKD EPI equation.     This calculation has not been validated in all clinical situations.     eGFR's persistently <90 mL/min signify possible Chronic Kidney     Disease.   Anion gap 12  5 - 15  TROPONIN I     Status: None   Collection Time    05/01/14  2:00 PM      Result Value Ref Range   Troponin I <0.30  <0.30 ng/mL   Comment:            Due to the release kinetics of cTnI,     a negative result within the first  hours     of the onset of symptoms does not rule out     myocardial infarction with certainty.     If myocardial infarction is still suspected,     repeat the test at appropriate intervals.   Labs are reviewed and are pertinent for  Mild leukocytosis, which could be lithium induced, and decreasing lithium level, currently 1.16 .  Current Facility-Administered Medications  Medication Dose Route Frequency Provider Last Rate Last Dose  . 0.9 %  sodium chloride infusion  1,000 mL Intravenous Continuous Dorie Rank, MD      . acetaminophen (TYLENOL) tablet 650 mg  650 mg Oral Q6H PRN Toy Baker, MD       Or  . acetaminophen (TYLENOL) suppository 650 mg  650 mg Rectal Q6H PRN Toy Baker, MD      . ARIPiprazole (ABILIFY) tablet 15 mg  15 mg Oral Daily Toy Baker, MD      .  aspirin EC tablet 81 mg  81 mg Oral Daily Toy Baker, MD      . benztropine (COGENTIN) tablet 0.5 mg  0.5 mg Oral BID Toy Baker, MD      . docusate sodium (COLACE) capsule 100 mg  100 mg Oral BID Toy Baker, MD      . enoxaparin (LOVENOX) injection 40 mg  40 mg Subcutaneous Q24H Toy Baker, MD      . FLUoxetine (PROZAC) capsule 20 mg  20 mg Oral Daily Toy Baker, MD      . gabapentin (NEURONTIN) capsule 200 mg  200 mg Oral BID Toy Baker, MD      . HYDROcodone-acetaminophen (NORCO/VICODIN) 5-325 MG per tablet 1-2 tablet  1-2 tablet Oral Q4H PRN Toy Baker, MD      . sodium chloride 0.9 % injection 3 mL  3 mL Intravenous Q12H Toy Baker, MD      . venlafaxine XR (EFFEXOR-XR) 24 hr capsule 75 mg  75 mg Oral Daily Toy Baker, MD      . zolpidem (AMBIEN) tablet 5 mg  5 mg Oral QHS Toy Baker, MD        Psychiatric Specialty Exam:     Blood pressure 119/74, pulse 89, temperature 98.1 F (36.7 C), temperature source Axillary, resp. rate 20, height 5' (1.524 m), weight 68.04 kg (150 lb), SpO2 99.00%.Body mass index is 29.3  kg/(m^2).  General Appearance: Fairly Groomed- in bed, in restraints  Engineer, water::  Fair  Speech:  Slow  Volume:  Decreased  Mood:  Dysphoric and labile   Affect:  Depressed  Thought Process:  disorganized   Orientation:  Other:  currently partially disoriented, and drowsy  Thought Content:  denies hallucinations, appears disorganized in thought process  Suicidal Thoughts:  No- denies SI or HI  Homicidal Thoughts:  No  Memory:  unable to test at this time, due to drowsiness, confusion  Judgement:  Impaired  Insight:  Lacking  Psychomotor Activity:  Decreased- intermittently agitated as per report  Concentration:  Poor  Recall:  Poor  Fund of Knowledge:Poor  Language: Poor  Akathisia:  Negative  Handed:  Right  AIMS (if indicated):     Assets:  Desire for Improvement Resilience  Sleep:      Musculoskeletal: Strength & Muscle Tone: unable to ascertain at this time Gait & Station: as above Patient leans: N/A  Treatment Plan Summary: 1. At this time I would consider DELIRIUM of uncertain etiology as most likely diagnosis, due to confusion and impaired sensorium, difficulty maintaining attention. Lithium toxicity might have contributed to this, but as noted, li levels are decreasing. 2. Although not likely diagnosis , a potential differential diagnosis could be  serotonin syndrome contributing to her presentation. ( Agitation, confusion) . Would therefore consider holding Prozac, Abilify,  Effexor XR at this time and managing with Haldol for current psychosis,agitation , delirium. Would continue management with Haldol PRNs Agitation and  consider 1-2 mgrs PO Q 6 hours if she agrees to PO medications)  3. May also benefit from Ativan 1-2 mgrs PO Q 6-8 hours PRN agitation, if needed. 4. Would not restart Lithium at this time 4. As patient improves, and is cleared medically, may require inpatient psychiatric admission. Will follow  Brook Geraci, [facility] 05/01/2014 4:17 PM

## 2014-05-01 NOTE — Progress Notes (Signed)
Patient very psychotic, refusing medications, and refusing to be compliant with anything.  Patient very agitated, MD aware and ordered IV Haldol.  Patient adamantly refusing Haldol.  Sister in [place] called and talked to patient.  Patient still resistant to accepting Haldol.  Sister updated this nurse on comprehensive medical history, but did not have any insight into convincing patient that she needed to take the Haldol.  Department director currently talking to patient.  MD made aware of patient's refusal, and MD ordered restraints.  Durwin Nora RN

## 2014-05-01 NOTE — Progress Notes (Signed)
Patient refused troponin blood draw, saying that phlebotomist did not have the right kind of needle.  Durwin Nora RN

## 2014-05-01 NOTE — Progress Notes (Signed)
[facility] PROGRESS NOTE  [name] DPO:[id number] DOB: 17-Aug-1961 DOA: 04/30/2014 PCP: [name] Dawson, MD  Assessment/Plan: Lithium toxicity Recently increased dose and likely overmedicated. Lithium held and monitored on telemetry. Levels therapeutic on subsequent lab. Monitor electrolytes. Psychiatry consulted. -Continue IV fluids. Recheck lithium level in a.m.  Bipolar affective disorder with Acute mania/ acute schizoaffective symptoms Patient recently admitted to be acute for acute schizoaffective symptoms Patient is refusing all her medications and has bizarre thoughts. She is on lithium and Abilify. She is also on Prozac and Effexor   for depressive symptoms. All her medications are currently held. We'll psych recommendations for dose adjustment. -Continue when necessary Haldol for agitation. Continue soft restraints  History of bleeding ulcer Medication list shows patient is on Advil 600 mg 4 hours as needed which needs to be discontinued completely.  Code Status: Full code Family Communication: None at Bedside Disposition Plan: Discharge to [facility]   Consultants:  Psychiatry  Procedures:  None  Antibiotics:  None  HPI/Subjective: Patient seen this morning th wondering restlessly in the  [facility]. As per nursing she has been yelling and cursing. She appears psychotic and has bizarre thoughts and conversations. Says " I got cancer, what've u got? " She wants the IV fluid to be stopped as it was making her crazy. Patient given 5 mg IV Haldol for agitation which did not control her symptoms and was given 2 mg IV Ativan after which she fell asleep  Objective: Filed Vitals:   05/01/14 1402  BP: 119/74  Pulse: 89  Temp: 98.1 F (36.7 C)  Resp: 20    Intake/Output Summary (Last 24 hours) at 05/01/14 1520 Last data filed at 05/01/14 0430  Gross per 24 hour  Intake    720 ml  Output      0 ml  Net    720 ml   Filed Weights   04/30/14 2134  Weight:  68.04 kg (150 lb)    Exam:   General:  Middle aged female sleepy in bed  HEENT: Moist oral mucosa  Cardiovascular: Normal S1 and S2, no murmurs  Respiratory: Clear to auscultation bilaterally  Abdomen: Soft, nondistended, bowel sounds present  Musculoskeletal: Warm, no edema  CNS: sleepy and poorly arousal after receiving ativan  Data Reviewed: Basic Metabolic Panel:  Recent Labs Lab 04/30/14 2216 05/01/14 0305 05/01/14 1134  NA 133* 138 138  K 4.1 3.9 4.2  CL 97 105 106  CO2 23 22 20   GLUCOSE 110* 100* 106*  BUN 14 12 11   CREATININE 0.67 0.66 0.65  CALCIUM 10.0 9.4 9.7  MG  --  2.3  --   PHOS  --  2.8  --    Liver Function Tests:  Recent Labs Lab 05/01/14 0305  AST 39*  ALT 60*  ALKPHOS 98  BILITOT 0.9  PROT 7.1  ALBUMIN 3.8    Recent Labs Lab 04/30/14 2216  LIPASE 22   No results found for this basename: AMMONIA,  in the last 168 hours CBC:  Recent Labs Lab 04/30/14 2216 05/01/14 0305  WBC 11.7* 10.5  HGB 12.9 12.7  HCT 39.8 39.3  MCV 87.7 89.7  PLT 359 330   Cardiac Enzymes:  Recent Labs Lab 05/01/14 0305 05/01/14 1400  TROPONINI <0.30 <0.30   BNP (last 3 results) No results found for this basename: PROBNP,  in the last 8760 hours CBG: No results found for this basename: GLUCAP,  in the last 168 hours  Recent Results (from the  past 240 hour(s))  URINE CULTURE     Status: None   Collection Time    04/21/14  3:45 PM      Result Value Ref Range Status   Specimen Description     Final   Value: URINE, RANDOM     Performed at [place] Requests     Final   Value: NONE     Performed at [facility]   Culture  Setup Time     Final   Value: 04/22/2014 01:45     Performed at [place]     Final   Value: NO GROWTH     Performed at [facility]   Culture     Final   Value: NO GROWTH     Performed at [facility]   Report Status  04/23/2014 FINAL   Final     Studies: Dg Chest Port 1 View  04/30/2014   CLINICAL DATA:  Chest pain  EXAM: PORTABLE CHEST - 1 VIEW  COMPARISON:  Prior radiograph from 01/19/2014  FINDINGS: The cardiac and mediastinal silhouettes are stable in size and contour, and remain within normal limits.  The lungs are normally inflated. No airspace consolidation, pleural effusion, or pulmonary edema is identified. There is no pneumothorax.  No acute osseous abnormality identified.  IMPRESSION: No active cardiopulmonary disease.   Electronically Signed   By: Jeannine Boga M.D.   On: 04/30/2014 22:52    Scheduled Meds: . ARIPiprazole  15 mg Oral Daily  . aspirin EC  81 mg Oral Daily  . benztropine  0.5 mg Oral BID  . docusate sodium  100 mg Oral BID  . enoxaparin (LOVENOX) injection  40 mg Subcutaneous Q24H  . FLUoxetine  20 mg Oral Daily  . gabapentin  200 mg Oral BID  . sodium chloride  3 mL Intravenous Q12H  . venlafaxine XR  75 mg Oral Daily  . zolpidem  5 mg Oral QHS   Continuous Infusions: . sodium chloride        Time spent: 25 minutes    [name], [place]  [facility] Pager [phone] If 7PM-7AM, please contact night-coverage at [email or web address], password [facility] 05/01/2014, 3:20 PM  LOS: 1 day

## 2014-05-01 NOTE — Progress Notes (Signed)
Clinical Social Work  CSW received referral to complete psychosocial assessment. Per bedside RN, now is not an appropriate time to meet with patient due to being in restraints and unable to fully participate in assessment. CSW will follow up at later time.  [place], [place] [phone]

## 2014-05-01 NOTE — H&P (Signed)
PCP:  [name], MD    Chief Complaint:  Confused and aggitated  HPI: [name] is a 53 y.o. female   has a past medical history of Headache(784.0); IBS (irritable bowel syndrome); Bipolar 1 disorder; Personality disorder; History of duodenal ulcer; Pelvic pain; Complication of anesthesia; Frequency of urination; Urgency of urination; and Nocturia.   Presented with  Patient has hx of bipolar disorder. She has recently had her Lithium dose increased from 300 mg once a day to 900mg  BID. In the past few days she has self disconitued oh her anti-psychotic medications except for lesion which she actually has taking some extra doses of. Patient started to feel somewhat agitated and felt that extra dose of lithium would help. She started to report worsening first and felt that she had trouble urinating. Patient had had some unspecified chest pain which she cannot describe further. She presented to emergency department. The patient was not found to have any evidence of urinary retention. She had 3-6 white blood cells in the urine but otherwise unremarkable. Her lithium was noted to be at 1.55. Given mild confusion lithium toxicity was suspected.  Lithium for the past 37 years Hospitalist was called for admission for lithium toxicity mild  Review of Systems:    Pertinent positives include: Increase first slight confusion chest pain  Constitutional:  No weight loss, night sweats, Fevers, chills, fatigue, weight loss  HEENT:  No headaches, Difficulty swallowing,Tooth/dental problems,Sore throat,  No sneezing, itching, ear ache, nasal congestion, post nasal drip,  Cardio-vascular:  No Orthopnea, PND, anasarca, dizziness, palpitations.no Bilateral lower extremity swelling  GI:  No heartburn, indigestion, abdominal pain, nausea, vomiting, diarrhea, change in bowel habits, loss of appetite, melena, blood in stool, hematemesis Resp:  no shortness of breath at rest. No dyspnea on  exertion, No excess mucus, no productive cough, No non-productive cough, No coughing up of blood.No change in color of mucus.No wheezing. Skin:  no rash or lesions. No jaundice GU:  no dysuria, change in color of urine, no urgency or frequency. No straining to urinate.  No flank pain.  Musculoskeletal:  No joint pain or no joint swelling. No decreased range of motion. No back pain.  Psych:  No change in mood or affect. No depression or anxiety. No memory loss.  Neuro: no localizing neurological complaints, no tingling, no weakness, no double vision, no gait abnormality, no slurred speech,  Otherwise ROS are negative except for above, 10 systems were reviewed  Past Medical History: Past Medical History  Diagnosis Date  . Headache(784.0)   . IBS (irritable bowel syndrome)   . Bipolar 1 disorder     HX PSYCHOSIS  . Personality disorder     W/ BORDERLINE FEATURES  . History of duodenal ulcer   . Pelvic pain   . Complication of anesthesia     POST AGGITATION  . Frequency of urination   . Urgency of urination   . Nocturia    Past Surgical History  Procedure Laterality Date  . Laparoscopy N/A 12/14/2012    Procedure: LAPAROSCOPY DIAGNOSTIC;  Surgeon: Stark Klein, MD;  Location: [facility];  Service: General;  Laterality: N/A;  . Laparoscopic left salpingoophorectomy and lysys adhesions  03-10-2002  . Cardiovascular stress test  01-23-2011    NORMAL NUCLEAR STUDY/ NO ISCHEMIA/ EF 81%  . Transthoracic echocardiogram  10-13-2010    NORMAL LVF/  EF 55-60%  . Laparoscopic cholecystectomy  11-08-2001  . Cardiac catheterization  10-09-1999  DR Ron Parker  NORMAL LVF/  NORMAL RCA/  NO CRITICAL DISEASE LEFT CORONARY SYSTEM  . Laparoscopic removal ovary remnant  2003   [place]  . Total abdominal hysterectomy  2000    W/ RIGHT SALPINGOOPHORECTOMY  . Cystoscopy with biopsy N/A 06/03/2013    Procedure: CYSTOSCOPY WITH BIOPSY  INSTILLATION OF MARCAINE AND PYRIDIUM;  Surgeon: Hanley Ben,  MD;  Location: [place];  Service: Urology;  Laterality: N/A;     Medications: Prior to Admission medications   Medication Sig Start Date End Date Taking? Authorizing Provider  ARIPiprazole (ABILIFY) 15 MG tablet Take 15 mg by mouth daily.   Yes Historical Provider, MD  benztropine (COGENTIN) 0.5 MG tablet Take 1 tablet (0.5 mg total) by mouth 2 (two) times daily. For prevention of drug induced involuntary movement 04/24/14  Yes Encarnacion Slates, NP  diphenhydrAMINE (BENADRYL) 25 MG tablet Take 25 mg by mouth every 6 (six) hours as needed for itching, allergies or sleep.   Yes Historical Provider, MD  docusate sodium 100 MG CAPS Take 100 mg by mouth 2 (two) times daily. (This medicine may be purchased from over the counter at yr local pharmacy): For constipation 04/24/14  Yes Encarnacion Slates, NP  FLUoxetine (PROZAC) 20 MG tablet Take 20 mg by mouth daily.  04/30/14  Yes Historical Provider, MD  gabapentin (NEURONTIN) 100 MG capsule Take 2 capsules (200 mg total) by mouth 2 (two) times daily. For agitation/pain managment 04/24/14  Yes Encarnacion Slates, NP  ibuprofen (ADVIL,MOTRIN) 600 MG tablet Take 1 tablet (600 mg total) by mouth every 4 (four) hours as needed for mild pain or moderate pain. 04/24/14  Yes Encarnacion Slates, NP  lithium carbonate (ESKALITH) 450 MG CR tablet Take 900 mg by mouth 2 (two) times daily.   Yes Historical Provider, MD  Multiple Vitamin (MULTIVITAMIN WITH MINERALS) TABS tablet Take 1 tablet by mouth daily.   Yes Historical Provider, MD  venlafaxine XR (EFFEXOR-XR) 75 MG 24 hr capsule Take 75 mg by mouth daily with breakfast.   Yes Historical Provider, MD  zolpidem (AMBIEN) 10 MG tablet Take 10 mg by mouth at bedtime.   Yes Historical Provider, MD    Allergies:   Allergies  Allergen Reactions  . Morphine And Related Nausea And Vomiting    Social History:  Ambulatory  independently   Lives at home alone,        reports that she quit smoking about 4 years ago. Her  smoking use included Cigarettes. She has a .01 pack-year smoking history. She has never used smokeless tobacco. She reports that she drinks about 1.8 ounces of alcohol per week. She reports that she does not use illicit drugs.    Family History: family history includes Migraines in her sister; Other in her mother; Sleep apnea in her father.    Physical Exam: Patient Vitals for the past 24 hrs:  BP Temp Temp src Resp SpO2 Height Weight  04/30/14 2134 126/74 mmHg 97.8 F (36.6 C) Oral 17 94 % 5' (1.524 m) 68.04 kg (150 lb)  04/30/14 2123 - - - - 98 % - -    1. General:  in No Acute distress 2. Psychological: Alert and   Oriented 3. Head/ENT:    Dry Mucous Membranes                          Head Non traumatic, neck supple  Normal   Dentition 4. SKIN:  decreased Skin turgor,  Skin clean Dry and intact no rash 5. Heart: Regular rate and rhythm no Murmur, Rub or gallop 6. Lungs: Clear to auscultation bilaterally, no wheezes or crackles   7. Abdomen: Soft, non-tender, Non distended 8. Lower extremities: no clubbing, cyanosis, or edema 9. Neurologically Grossly intact, moving all 4 extremities equally 10. MSK: Normal range of motion  body mass index is 29.3 kg/(m^2).   Labs on Admission:   Recent Labs  04/30/14 2216  NA 133*  K 4.1  CL 97  CO2 23  GLUCOSE 110*  BUN 14  CREATININE 0.67  CALCIUM 10.0   No results found for this basename: AST, ALT, ALKPHOS, BILITOT, PROT, ALBUMIN,  in the last 72 hours  Recent Labs  04/30/14 2216  LIPASE 22    Recent Labs  04/30/14 2216  WBC 11.7*  HGB 12.9  HCT 39.8  MCV 87.7  PLT 359   No results found for this basename: CKTOTAL, CKMB, CKMBINDEX, TROPONINI,  in the last 72 hours No results found for this basename: TSH, T4TOTAL, FREET3, T3FREE, THYROIDAB,  in the last 72 hours No results found for this basename: VITAMINB12, FOLATE, FERRITIN, TIBC, IRON, RETICCTPCT,  in the last 72 hours Lab Results    Component Value Date   HGBA1C 6.0* 04/21/2014    Estimated Creatinine Clearance: 70.8 ml/min (by C-G formula based on Cr of 0.67). ABG    Component Value Date/Time   TCO2 23 03/14/2011 1220     Lab Results  Component Value Date   DDIMER  Value: 0.22        AT THE INHOUSE ESTABLISHED CUTOFF VALUE OF 0.48 ug/mL FEU, THIS ASSAY HAS BEEN DOCUMENTED IN THE LITERATURE TO HAVE A SENSITIVITY AND NEGATIVE PREDICTIVE VALUE OF AT LEAST 98 TO 99%.  THE TEST RESULT SHOULD BE CORRELATED WITH AN ASSESSMENT OF THE CLINICAL PROBABILITY OF DVT / VTE. 01/23/2011     Other results:  I have pearsonaly reviewed this: ECG REPORT  Rate: 106  Rhythm: Sinus tachycardia ST&T Change: No evidence of ischemia   UA 3-6 white blood cells culture pending  BNP (last 3 results) No results found for this basename: PROBNP,  in the last 8760 hours  Filed Weights   04/30/14 2134  Weight: 68.04 kg (150 lb)     Cultures:    Component Value Date/Time   SDES  Value: URINE, RANDOM Performed at [facility] 04/21/2014 1545   SPECREQUEST  Value: NONE Performed at [facility] 04/21/2014 1545   CULT  Value: NO GROWTH Performed at [place] 04/21/2014 1545   REPTSTATUS 04/23/2014 FINAL 04/21/2014 1545         Radiological Exams on Admission: Dg Chest Port 1 View  04/30/2014   CLINICAL DATA:  Chest pain  EXAM: PORTABLE CHEST - 1 VIEW  COMPARISON:  Prior radiograph from 01/19/2014  FINDINGS: The cardiac and mediastinal silhouettes are stable in size and contour, and remain within normal limits.  The lungs are normally inflated. No airspace consolidation, pleural effusion, or pulmonary edema is identified. There is no pneumothorax.  No acute osseous abnormality identified.  IMPRESSION: No active cardiopulmonary disease.   Electronically Signed   By: Jeannine Boga M.D.   On: 04/30/2014 22:52    Chart has been reviewed  Assessment/Plan  53 year old female history of  bipolar disorder here with mild confusion and slightly elevated at a level of 1.55 been admitted for  IV fluids and monitoring Present on Admission:  . Lithium toxicity - mild possibly chronic given recent increased dosing in the family as well as self-medication will admit and hold lithium for now. Administer IV fluids encourage by mouth intake follow creatinine level. Monitor on telemetry monitor QTC prolongation. Monitor her sodium levels. Repeat UA to evaluate for urine concentration to screen for diabetes insipidus. Bowel reach hydrating date normal saline to urge free water intake.  . Bipolar affective disorder will restart Abilify and other antipsychotic medications but hold lithium she'll likely benefit from psychiatry evaluation and recommendations on her medications  . Leukocytosis mild continue to follow await results of urine culture  . Dehydration administer IV fluids check orthostatics   Prophylaxis: Lovenox  CODE STATUS:  FULL CODE   Other plan as per orders.  I have spent a total of 55 min on this admission  Huston Stonehocker 05/01/2014, 12:39 AM  [facility]  Pager [phone]   If 7AM-7PM, please contact the day team taking care of the patient  [email or web address]  Password [email or web address]

## 2014-05-01 NOTE — Progress Notes (Signed)
Pt arrived to floor room 1520 via stretcher. VS taken. Pt oriented to room and callbell. Periods of confusion and agitation about family members and fiancee joining her in the room.. Gait steady. Initial assessment  Completed . Will continue to monitor pt

## 2014-05-01 NOTE — Progress Notes (Signed)
Department Director able to convince patient of need for Haldol.  After getting Haldol patient more cooperative,  and less agitated.  Patient does not currently need restraints, and thus patient never placed in restraints.  Bed alarm initiated, and patient agreeable to calling for assistance.  Durwin Nora RN

## 2014-05-02 DIAGNOSIS — F312 Bipolar disorder, current episode manic severe with psychotic features: Secondary | ICD-10-CM

## 2014-05-02 LAB — BASIC METABOLIC PANEL
ANION GAP: 9 (ref 5–15)
ANION GAP: 9 (ref 5–15)
BUN: 14 mg/dL (ref 6–23)
BUN: 13 mg/dL (ref 6–23)
CHLORIDE: 106 meq/L (ref 96–112)
CHLORIDE: 109 meq/L (ref 96–112)
CO2: 22 mEq/L (ref 19–32)
CO2: 23 mEq/L (ref 19–32)
CREATININE: 0.81 mg/dL (ref 0.50–1.10)
Calcium: 9.2 mg/dL (ref 8.4–10.5)
Calcium: 9.2 mg/dL (ref 8.4–10.5)
Creatinine, Ser: 0.74 mg/dL (ref 0.50–1.10)
GFR calc Af Amer: >90 mL/min (ref >90)
GFR calc non Af Amer: 82 mL/min — ABNORMAL LOW (ref >90)
GFR calc non Af Amer: >90 mL/min (ref >90)
GLUCOSE: 105 mg/dL — AB (ref 70–99)
Glucose, Bld: 108 mg/dL — ABNORMAL HIGH (ref 70–99)
Potassium: 3.9 mEq/L (ref 3.7–5.3)
Potassium: 4.1 mEq/L (ref 3.7–5.3)
Sodium: 137 mEq/L (ref 137–147)
Sodium: 141 mEq/L (ref 137–147)

## 2014-05-02 LAB — LITHIUM LEVEL: Lithium Lvl: 0.25 mEq/L — ABNORMAL LOW (ref 0.80–1.40)

## 2014-05-02 MED ORDER — HALOPERIDOL LACTATE 5 MG/ML IJ SOLN: 1.0000 mg | Freq: Four times a day (QID) | INTRAMUSCULAR | Status: DC | PRN

## 2014-05-02 MED ORDER — LORAZEPAM 2 MG/ML IJ SOLN: 1.0000 mg | Freq: Four times a day (QID) | INTRAMUSCULAR | Status: DC | PRN

## 2014-05-02 NOTE — Discharge Summary (Addendum)
Physician Discharge Summary  [name] WUJ:[id number] DOB: [birth year] DOA: 04/30/2014  PCP: [name] Dawson, MD  Admit date: 04/30/2014 Discharge date: 05/02/2014  Time spent: 30 minutes  Recommendations for Outpatient Follow-up:  1. Discharge to [facility]  Discharge Diagnoses:  Principal problem Lithium toxicity  Active Problems:   Bipolar disease, chronic   Bipolar affective disorder   Leukocytosis   Dehydration   Discharge Condition: Fair  Diet recommendation: Regular  Filed Weights   04/30/14 2134  Weight: 68.04 kg (150 lb)    History of present illness:  Please refer to admission H&P for details, but in brief, 53 year old female with history of IBS, bipolar disorder, personality disorder, history of duodenal ulcer, recent hospitalization to [facility] for acute schizoaffective symptoms was brought to the ED for agitation and difficulty urinating. Apparently patient'S lithium dose was increased with the anemia once a day to 900 mg twice a day recently and patient had taking it the last few days and since he felt agitated on the day of admission she took some extra doses of lithium. Patient's lithium level was found to be 1.55. Her UA was unremarkable with no signs of urinary retention. Patient admitted to telemetry.   Hospital Course:  Lithium toxicity  Recently increased dose and likely overmedicated. Lithium held and monitored on telemetry. Levels therapeutic on subsequent lab.  Patient had bizzare behavior following admission and was acutely psychotic requiring IV have those and IV Ativan after which she calmed down. She has returned to her baseline mental status today and is calm and coherent. Seen by psych consult and given her recent hospitalization to be able to and acute presentation of lithium toxicity with acute psychosis recommends inpatient psychiatric admission. Recommended to hold her medications including Abilify, Prozac and Effexor her. I would hold her lithium  well and defer to  psychiatry for dose adjustment.   Bipolar affective disorder with Acute mania/ acute schizoaffective symptoms  Recent hospitalization to [facility]. Pt had acute psychotic symptoms following admission requiring haldol and ativan. Now stale.  Held all meds. tx to [facility]  History of bleeding ulcer  Medication list shows patient is on Advil 600 mg 4 hours as needed which needs to be discontinued completely.   Transaminitis Mild. Monitor as outpatient  Code Status: Full code  Family Communication: None at Bedside   Disposition Plan: Discharge to [facility] once bed available   Diet: Regular   consults: Dr. Parke Poisson,( psychiatry)   Discharge Exam: Filed Vitals:   05/02/14 1325  BP: 120/69  Pulse: 84  Temp: 98.2 F (36.8 C)  Resp: 20    General:  middle aged female in no acute distress HEENT: Moist oral mucosa  Cardiovascular:  normal S1 and S2, no murmurs  Respiratory: clear bilaterally Abdomen: Soft, nondistended Extremities: Warm, no edema CNS: Alert and oriented   Discharge Instructions You were cared for by a hospitalist during your hospital stay. If you have any questions about your discharge medications or the care you received while you were in the hospital after you are discharged, you can call the unit and asked to speak with the hospitalist on call if the hospitalist that took care of you is not available. Once you are discharged, your primary care physician will handle any further medical issues. Please note that NO REFILLS for any discharge medications will be authorized once you are discharged, as it is imperative that you return to your primary care physician (or establish a relationship with a primary care physician if you  do not have one) for your aftercare needs so that they can reassess your need for medications and monitor your lab values.   Current Discharge Medication List    CONTINUE these medications which have NOT CHANGED   Details  diphenhydrAMINE  (BENADRYL) 25 MG tablet Take 25 mg by mouth every 6 (six) hours as needed for itching, allergies or sleep.    docusate sodium 100 MG CAPS Take 100 mg by mouth 2 (two) times daily. (This medicine may be purchased from over the counter at yr local pharmacy): For constipation Qty: 10 capsule, Refills: 0    gabapentin (NEURONTIN) 100 MG capsule Take 2 capsules (200 mg total) by mouth 2 (two) times daily. For agitation/pain managment Qty: 120 capsule, Refills: 0    Multiple Vitamin (MULTIVITAMIN WITH MINERALS) TABS tablet Take 1 tablet by mouth daily.        STOP taking these medications     ARIPiprazole (ABILIFY) 15 MG tablet      benztropine (COGENTIN) 0.5 MG tablet      FLUoxetine (PROZAC) 20 MG tablet      ibuprofen (ADVIL,MOTRIN) 600 MG tablet      lithium carbonate (ESKALITH) 450 MG CR tablet      venlafaxine XR (EFFEXOR-XR) 75 MG 24 hr capsule        Allergies  Allergen Reactions  . Morphine And Related Nausea And Vomiting      The results of significant diagnostics from this hospitalization (including imaging, microbiology, ancillary and laboratory) are listed below for reference.    Significant Diagnostic Studies: Dg Chest Port 1 View  04/30/2014   CLINICAL DATA:  Chest pain  EXAM: PORTABLE CHEST - 1 VIEW  COMPARISON:  Prior radiograph from 01/19/2014  FINDINGS: The cardiac and mediastinal silhouettes are stable in size and contour, and remain within normal limits.  The lungs are normally inflated. No airspace consolidation, pleural effusion, or pulmonary edema is identified. There is no pneumothorax.  No acute osseous abnormality identified.  IMPRESSION: No active cardiopulmonary disease.   Electronically Signed   By: Jeannine Boga M.D.   On: 04/30/2014 22:52    Microbiology: Recent Results (from the past 240 hour(s))  URINE CULTURE     Status: None   Collection Time    05/01/14 12:08 AM      Result Value Ref Range Status   Specimen Description URINE,  RANDOM   Final   Special Requests NONE   Final   Culture  Setup Time     Final   Value: 05/01/2014 10:22     Performed at [facility]     Final   Value: 25,000 COLONIES/ML     Performed at [facility]   Culture     Final   Value: Joiner     Performed at [facility]   Report Status PENDING   Incomplete     Labs: Basic Metabolic Panel:  Recent Labs Lab 04/30/14 2216 05/01/14 0305 05/01/14 1134 05/01/14 2330 05/02/14 0458  NA 133* 138 138 137 141  K 4.1 3.9 4.2 4.1 3.9  CL 97 105 106 106 109  CO2 23 22 20 22 23   GLUCOSE 110* 100* 106* 105* 108*  BUN 14 12 11 14 13   CREATININE 0.67 0.66 0.65 0.81 0.74  CALCIUM 10.0 9.4 9.7 9.2 9.2  MG  --  2.3  --   --   --   PHOS  --  2.8  --   --   --  Liver Function Tests:  Recent Labs Lab 05/01/14 0305  AST 39*  ALT 60*  ALKPHOS 98  BILITOT 0.9  PROT 7.1  ALBUMIN 3.8    Recent Labs Lab 04/30/14 2216  LIPASE 22   No results found for this basename: AMMONIA,  in the last 168 hours CBC:  Recent Labs Lab 04/30/14 2216 05/01/14 0305  WBC 11.7* 10.5  HGB 12.9 12.7  HCT 39.8 39.3  MCV 87.7 89.7  PLT 359 330   Cardiac Enzymes:  Recent Labs Lab 05/01/14 0305 05/01/14 1400  TROPONINI <0.30 <0.30   BNP: BNP (last 3 results) No results found for this basename: PROBNP,  in the last 8760 hours CBG: No results found for this basename: GLUCAP,  in the last 168 hours     Signed:  Louellen Molder  [facility] 05/02/2014, 3:40 PM

## 2014-05-02 NOTE — Consult Note (Signed)
  Patient discussed with Dr. Clementeen Graham, and patient seen  Duration 20 minutes  Patient is much improved today, compared to yesterday. She is currently more alert, more attentive, better related, calm, better eye contact, and her mood is improved, affect is more reactive. Thought process is more linear, and at this time she states her mood is better. She is not currently suicidal or homicidal and denies any hallucinations nor does she exhibit any current delusional thinking. She is oriented to place and partially to time ( September, 2015).  Based on her very recent psychiatric admission ( 8/26 -9/5) for severe psychiatric suymptoms , medication  issues regarding  best management  options at this time (  Taking into account concern regarding possible serotonin syndrome  And  Recent lithium toxicity), I do think a psychiatric admission is warranted when patient is medically cleared. Patient agrees and states she is willing to sign in voluntarily, as there are no current grounds for involuntary commitment. Dx- Resolving Delirium, Bipolar Disorder by History Recommend- offer patient inpatient psychiatric admission for further stabilization upon medical clearance. Dr. Clementeen Graham aware of the above.

## 2014-05-02 NOTE — Progress Notes (Addendum)
Clinical Social Work  CSW reviewed chart which stated that psych MD feels patient could benefit from inpatient placement but does not meet criteria for IVC. CSW met with patient to discuss DC plans. CSW spoke with patient about returning for inpatient treatment. Patient reports she spoke with friend Coralyn Mark Real [phone]) and has developed a plan to return home. Patient reports she was recently at [facility] does not feel it would be beneficial to return. CSW educated patient that CSW could search other psych facilities if she did not want to return to [facility]. Patient thanked CSW for offer but reports she would feel most comfortable returning home. CSW spoke with friend via phone who reports she will provide patient with transportation home when she is DC.  Patient reports she has a follow up appointment with psychiatrist on 9/21 and will talk with her psychiatrist about any medication changes.   CSW spoke with attending MD and updated him on patient's wishes. MD reports he will talk with patient as well. CSW will continue to follow.  Sindy Messing, [place] [phone]   1700 MD called and reports that he spoke with patient and she would be agreeable to [facility]. CSW tried calling [facility] multiple times with no success. CSW will follow up in the morning to determine if any beds are available or if patient is agreeable to search other facilities.

## 2014-05-02 NOTE — Progress Notes (Signed)
Clinical Social Work Department CLINICAL SOCIAL WORK PSYCHIATRY SERVICE LINE ASSESSMENT 05/02/2014  Patient:  [name]  Account:  [id number]  [place] Date:  04/30/2014  Clinical Social Worker:  Sindy Messing, LCSW  Date/Time:  05/02/2014 01:00 PM Referred by:  Physician  Date referred:  05/02/2014 Reason for Referral  Psychosocial assessment   Presenting Symptoms/Problems (In the person's/family's own words):   Psych consulted for medication management.   Abuse/Neglect/Trauma History (check all that apply)  Denies history   Abuse/Neglect/Trauma Comments:   Psychiatric History (check all that apply)  Outpatient treatment  Inpatient/hospitilization   Psychiatric medications:  Ambien 5 mg   Current Mental Health Hospitalizations/Previous Mental Health History:   Patient reports she was diagnosed with bipolar disorder when she was 53 years old. Patient continues to receive medication management but reports she feels that her bipolar is improving.   Current provider:   Dr. Emelda Brothers   Place and Date:   [facility]   Current Medications:   Scheduled Meds:      . aspirin EC  81 mg Oral Daily  . docusate sodium  100 mg Oral BID  . enoxaparin (LOVENOX) injection  40 mg Subcutaneous Q24H  . gabapentin  200 mg Oral BID  . sodium chloride  3 mL Intravenous Q12H  . zolpidem  5 mg Oral QHS        Continuous Infusions:      . sodium chloride           PRN Meds:.acetaminophen, acetaminophen, haloperidol lactate, HYDROcodone-acetaminophen, LORazepam       Previous Impatient Admission/Date/Reason:   Patient reports several hospitalizations and a recent admit to [facility].   Emotional Health / Current Symptoms    Suicide/Self Harm  None reported   Suicide attempt in the past:   Patient denies any SI or HI. Patient denies any previous attempts.   Other harmful behavior:   None reported   Psychotic/Dissociative Symptoms  Confusion   Other  Psychotic/Dissociative Symptoms:   Patient reports she was confused and paranoid yesterday. Patient reports since medications have been adjusted that she feels better and denies any current psychotic symptoms.    Attention/Behavioral Symptoms  Within Normal Limits   Other Attention / Behavioral Symptoms:   Patient engaged during assessment.    Cognitive Impairment  Within Normal Limits   Other Cognitive Impairment:   Patient alert and oriented.    Mood and Adjustment  Mood Congruent    Stress, Anxiety, Trauma, Any Recent Loss/Stressor  Grief/Loss (recent or history)   Anxiety (frequency):   N/A   Phobia (specify):   N/A   Compulsive behavior (specify):   N/A   Obsessive behavior (specify):   N/A   Other:   Patient reports that she lost her dog last month that she had for over 16 years. Patient reports she feels lonely at home and misses having a companion.   Substance Abuse/Use  None   SBIRT completed (please refer for detailed history):  N  Self-reported substance use:   Patient denies any substance use.   Urinary Drug Screen Completed:  N Alcohol level:   N/A    Environmental/Housing/Living Arrangement  Stable housing   Who is in the home:   Alone   Emergency contact:  [place]   Patient's Strengths and Goals (patient's own words):   Patient reports supportive friends and family.   Clinical Social Worker's Interpretive Summary:   CSW received referral in order  to complete psychosocial assessment. CSW reviewed chart and met with patient at bedside. CSW introduced myself and explained role.    Patient reports that she lives home alone and is not married and does not have children. 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    Patient was diagnosed with bipolar disorder when she was 53 years old and was prescribed  Lithium at that time. Patient reports as she has gotten older she feels that bipolar symptoms have improved and she wants to talk with psych MD about medication changes. Patient follows up with Dr. Emelda Brothers and has a scheduled appointment on 05/08/14. Patient reports she does not feel that Lithium is needed anymore and would prefer to try a different medication.    Patient reports she was at the hospital recently and left AMA. Since patient left AMA, her medications got out of sorts and patient was not taking her medications properly. Patient reports that she plans to "start fresh" when she gets home and will discard all old medications and place new medications in pill boxes. Patient usually manages her medications on her own and feels confident she can manage her medications once she gets a more organized routine. Patient reports that she was feeling confused yesterday and had never felt that "off" before. Patient reports that now that medications are adjusted she wants to go home and follow up on an outpatient basis.    CSW will continue to follow and assist with any recommendations provided by psych MD.   Disposition:  Recommend Psych CSW continuing to support while in hospital   [place], [place] [phone]

## 2014-05-02 NOTE — Care Management Note (Unsigned)
    Page 1 of 1   05/02/2014     4:03:56 PM CARE MANAGEMENT NOTE 05/02/2014  Patient:  [name], [name]   Account Number:  [id number]  Date Initiated:  05/02/2014  Documentation initiated by:  [facility]  Subjective/Objective Assessment:   53 year old female admitted with lithium toxicity.     Action/Plan:   From home alone.   Anticipated DC Date:  05/02/2014   Anticipated DC Plan:  HOME/SELF CARE  In-house referral  Clinical Social Worker      DC Planning Services  CM consult      Choice offered to / List presented to:             Status of service:  Completed, signed off Medicare Important Message given?   (If response is "NO", the following Medicare IM given date fields will be blank) Date Medicare IM given:   Medicare IM given by:   Date Additional Medicare IM given:   Additional Medicare IM given by:    Discharge Disposition:    Per UR Regulation:  Reviewed for med. necessity/level of care/duration of stay  If discussed at [place] of Stay Meetings, dates discussed:    Comments:

## 2014-05-03 ENCOUNTER — Encounter (HOSPITAL_COMMUNITY): Payer: Self-pay

## 2014-05-03 ENCOUNTER — Inpatient Hospital Stay (HOSPITAL_COMMUNITY)
Admission: AD | Admit: 2014-05-03 | Discharge: 2014-05-17 | DRG: 885 | Disposition: A | Discharge status: Home Health | Payer: BC Managed Care – PPO | Source: Intra-hospital | Attending: Psychiatry | Admitting: Psychiatry

## 2014-05-03 ENCOUNTER — Ambulatory Visit: Payer: Self-pay | Admitting: Cardiovascular Disease

## 2014-05-03 DIAGNOSIS — K219 Gastro-esophageal reflux disease without esophagitis: Secondary | ICD-10-CM | POA: Diagnosis present

## 2014-05-03 DIAGNOSIS — R5383 Other fatigue: Secondary | ICD-10-CM

## 2014-05-03 DIAGNOSIS — R5381 Other malaise: Secondary | ICD-10-CM | POA: Diagnosis present

## 2014-05-03 DIAGNOSIS — K589 Irritable bowel syndrome without diarrhea: Secondary | ICD-10-CM | POA: Diagnosis present

## 2014-05-03 DIAGNOSIS — F319 Bipolar disorder, unspecified: Secondary | ICD-10-CM

## 2014-05-03 DIAGNOSIS — Z87891 Personal history of nicotine dependence: Secondary | ICD-10-CM | POA: Diagnosis not present

## 2014-05-03 DIAGNOSIS — K5900 Constipation, unspecified: Secondary | ICD-10-CM | POA: Diagnosis present

## 2014-05-03 DIAGNOSIS — IMO0002 Reserved for concepts with insufficient information to code with codable children: Secondary | ICD-10-CM

## 2014-05-03 DIAGNOSIS — F603 Borderline personality disorder: Secondary | ICD-10-CM | POA: Diagnosis present

## 2014-05-03 DIAGNOSIS — F259 Schizoaffective disorder, unspecified: Secondary | ICD-10-CM | POA: Diagnosis present

## 2014-05-03 DIAGNOSIS — F411 Generalized anxiety disorder: Secondary | ICD-10-CM | POA: Diagnosis present

## 2014-05-03 DIAGNOSIS — G4700 Insomnia, unspecified: Secondary | ICD-10-CM | POA: Diagnosis present

## 2014-05-03 DIAGNOSIS — F451 Undifferentiated somatoform disorder: Secondary | ICD-10-CM

## 2014-05-03 DIAGNOSIS — F0280 Dementia in other diseases classified elsewhere without behavioral disturbance: Secondary | ICD-10-CM

## 2014-05-03 DIAGNOSIS — F250 Schizoaffective disorder, bipolar type: Secondary | ICD-10-CM

## 2014-05-03 LAB — URINE CULTURE

## 2014-05-03 MED ORDER — GABAPENTIN 100 MG PO CAPS: 200.0000 mg | ORAL_CAPSULE | Freq: Two times a day (BID) | ORAL | Status: DC

## 2014-05-03 MED ORDER — QUETIAPINE FUMARATE 100 MG PO TABS
100.0000 mg | ORAL_TABLET | Freq: Once | ORAL | Status: AC
  Administered 2014-05-03: 100 mg via ORAL
  Filled 2014-05-03 (×2): qty 1

## 2014-05-03 MED ORDER — DIPHENHYDRAMINE HCL 25 MG PO CAPS
25.0000 mg | ORAL_CAPSULE | Freq: Three times a day (TID) | ORAL | Status: DC | PRN
  Administered 2014-05-04 – 2014-05-09 (×4): 25 mg via ORAL
  Filled 2014-05-03 (×4): qty 1

## 2014-05-03 MED ORDER — GABAPENTIN 100 MG PO CAPS
200.0000 mg | ORAL_CAPSULE | Freq: Two times a day (BID) | ORAL | Status: DC
  Administered 2014-05-03 – 2014-05-04 (×2): 200 mg via ORAL
  Filled 2014-05-03 (×6): qty 2

## 2014-05-03 MED ORDER — HYDROXYZINE HCL 25 MG PO TABS
25.0000 mg | ORAL_TABLET | Freq: Four times a day (QID) | ORAL | Status: DC | PRN
  Administered 2014-05-03 – 2014-05-16 (×9): 25 mg via ORAL
  Filled 2014-05-03 (×12): qty 1

## 2014-05-03 MED ORDER — LORAZEPAM 0.5 MG PO TABS
0.5000 mg | ORAL_TABLET | Freq: Once | ORAL | Status: AC
  Administered 2014-05-03: 0.5 mg via ORAL
  Filled 2014-05-03: qty 1

## 2014-05-03 MED ORDER — ACETAMINOPHEN 325 MG PO TABS: 650.0000 mg | ORAL_TABLET | Freq: Four times a day (QID) | ORAL | Status: DC | PRN

## 2014-05-03 MED ORDER — ADULT MULTIVITAMIN W/MINERALS CH
1.0000 | ORAL_TABLET | Freq: Every day | ORAL | Status: DC
  Administered 2014-05-04 – 2014-05-17 (×13): 1 via ORAL
  Filled 2014-05-03 (×18): qty 1

## 2014-05-03 MED ORDER — ALUM & MAG HYDROXIDE-SIMETH 200-200-20 MG/5ML PO SUSP: 30.0000 mL | ORAL | Status: DC | PRN

## 2014-05-03 MED ORDER — MAGNESIUM HYDROXIDE 400 MG/5ML PO SUSP
30.0000 mL | Freq: Every day | ORAL | Status: DC | PRN
  Administered 2014-05-06 – 2014-05-07 (×2): 30 mL via ORAL

## 2014-05-03 NOTE — Progress Notes (Addendum)
Pt seen and examined at bedside. Stable for discharge. Please note that Ambien was taken off D/C med list and pt does not want to take it. Please see Dr Rozanna Box d/c summary.   Faye Ramsay, MD  [facility] Pager [phone]  If 7PM-7AM, please contact night-coverage [email or web address] Password [email or web address]

## 2014-05-03 NOTE — Discharge Instructions (Signed)

## 2014-05-03 NOTE — Progress Notes (Signed)
Did not attended group 

## 2014-05-03 NOTE — Progress Notes (Signed)
Pt is anxious, forgetful and says that she is here because "my memory isn't very good", pt is skeptical/paranoid/mistrustful, denies SI/HI/AVH, no complaints other than anxiety during admission, oriented pt to unit and rules.

## 2014-05-03 NOTE — Progress Notes (Signed)
Report called to Seth Bake at [facility].  Durwin Nora RN

## 2014-05-03 NOTE — Progress Notes (Signed)
Clinical Social Work  Patient accepted to [facility] [id number]. RN to call report to [phone]. CSW met with patient who is agreeable to go to [facility]. Patient spoke with friend Helene Kelp) via phone and gave CSW permission to speak with friend as well. Patient and friend aware of DC plans and friend will bring clothes to patient at [facility]. Patient signed voluntary admission form which was faxed to [facility] and original copy placed on chart. CSW coordinated transportation via [place] transportation.  CSW is signing off but available if needed.  [place], [place] [phone]

## 2014-05-04 ENCOUNTER — Telehealth: Payer: Self-pay | Admitting: *Deleted

## 2014-05-04 ENCOUNTER — Encounter (HOSPITAL_COMMUNITY): Payer: Self-pay | Admitting: Psychiatry

## 2014-05-04 DIAGNOSIS — F259 Schizoaffective disorder, unspecified: Secondary | ICD-10-CM

## 2014-05-04 DIAGNOSIS — F319 Bipolar disorder, unspecified: Secondary | ICD-10-CM

## 2014-05-04 MED ORDER — QUETIAPINE FUMARATE 200 MG PO TABS
200.0000 mg | ORAL_TABLET | Freq: Every day | ORAL | Status: DC
  Administered 2014-05-04: 200 mg via ORAL
  Filled 2014-05-04 (×2): qty 1

## 2014-05-04 MED ORDER — GABAPENTIN 100 MG PO CAPS
200.0000 mg | ORAL_CAPSULE | Freq: Three times a day (TID) | ORAL | Status: DC
  Administered 2014-05-04 – 2014-05-05 (×4): 200 mg via ORAL
  Filled 2014-05-04 (×6): qty 2

## 2014-05-04 NOTE — Progress Notes (Signed)
Adult Psychoeducational Group Note  Date:  05/04/2014 Time:  10:43 PM  Group Topic/Focus:  Wrap-Up Group:   The focus of this group is to help patients review their daily goal of treatment and discuss progress on daily workbooks.  Participation Level:  Did Not Attend  Participation Quality:  Drowsy  Affect:  Flat  Cognitive:  Confused  Insight: None  Engagement in Group:  None  Modes of Intervention:  None  Additional Comments:  Pt did not attend group at all   Archit Leger R 05/04/2014, 10:43 PM

## 2014-05-04 NOTE — BHH Group Notes (Signed)
Kenneth LCSW Group Therapy  05/04/2014 3:17 PM  Type of Therapy:  Group Therapy  Participation Level: Invited. Did Not Attend    Hyatt,Candace 05/04/2014, 3:17 PM

## 2014-05-04 NOTE — Clinical Social Work Note (Addendum)
Pt admitted voluntarily and is refusing to sign consent for [facility] or for CSW to refer her for PASRR number. Pt stated that she wants to return home and continue followup with Dr. Casimiro Needle for med management. CSW assessing. Pt allowing CSW to reach out to her sister, significant other and her mother but is adament that she wants to return home after d/c. "going home will be good for me. I feel stronger physically and mentally clear. I just need a better understanding of how to take my medication."   [place], LCSWA 05/04/2014 12:40 PM   CSW spoke with Helene Kelp Real (pt's friend) and left message for pt's sister, Dutch Quint. CSW also spoke with pt's mother, Nusaybah Ivie to discuss pt status and aftercare plan. Helene Kelp shared that the pt is minimizing her mental illness symptoms and she does not think the pt would do well coming home by herself. Gerri, pt's mother shared the same concern "she takes medication wrong and takes it however she wants."  CSW explained that unfortunately, pt is refusing referral for PASSR number and refusing to sign release for [facility].  Pt does not have money to pay for ALF, even if she were agreeable to referral for PASSR.Pt is not endorsing SI/HI and is oriented at this time. Pt's mother and friend encouraged to call for updates anytime and CSW will encourage pt to contact her mother, sister, and friend per their request.  Maxie Better, [place] 05/04/2014 2:03 PM
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
## 2014-05-04 NOTE — Telephone Encounter (Signed)
Called patient to notify her of stress test results since she missed her 05/03/14 appointment. Received message that mailbox is full and cannot leave a message. Patient already looked at results in my chart back in July.

## 2014-05-04 NOTE — Progress Notes (Signed)
Patient ID: [name], female   DOB: 11-28-1960, 53 y.o.   MRN: [id number] Pt reported to [facility] about having chest pain. Pt reports she is anxious about meeting the doctor in the morning and has become anxious. Pt vital: B/P(sitting) 134/81, P 98. (standing) 135/87 P 97 02 SAT 98%. Pain level 7 on a 0-10 scale. Pt c/o itching and stated having reaction from Seroquel given at bedtime. Pt given benadryl. Pt encourage to rest to be able to see provider in the morning. Pt reports pain level after an hour as 0 on a 0-10 scale. Pt needs constant redirection to stay in room.

## 2014-05-04 NOTE — Progress Notes (Signed)
D:  Per pt self inventory pt reports sleeping poor d/t anxiety and mood swings--feeling "revved up.", appetite good, energy level hyper, ability to pay attention good, rates depression at a 0 out of 10 and hopelessness at a 2 out of 10, anxiety at a 2 out of 10, pt is forgetful, fixed smile, needy and frequent requests from staff, Goal is for "partnership"     A:  Emotional support provided, Encouraged pt to continue with treatment plan and attend all group activities, q15 min checks maintained for safety.  R:  Pt is not going to groups, started to go to am group was not comfortable and did not want to stay so she went back to her room, pt shows little insight and is very forgetful, needing to be reminded of things frequently, MD aware.

## 2014-05-04 NOTE — Tx Team (Signed)
Interdisciplinary Treatment Plan Update (Adult)   Date: 05/04/2014   Time Reviewed: 11:18 AM  Progress in Treatment:  Attending groups: Yes  Participating in groups:  Yes  Taking medication as prescribed: Yes  Tolerating medication: Yes  Family/Significant othe contact made: Not yet. Collateral info needed.  Patient understands diagnosis: Somewhat, AEB seeking treatment for medication stabilization. She continues to minimize mental health issues.  Discussing patient identified problems/goals with staff: Yes  Medical problems stabilized or resolved: Yes  Denies suicidal/homicidal ideation: Yes during admission/self report.  Patient has not harmed self or Others: Yes  New problem(s) identified:  Discharge Plan or Barriers: Pt is hoping to return home and follow-up with Dr. Casimiro Needle. Pt refused to sign consent allowing CSW to refer her for PASRR number. Per Dr. Shea Evans, [facility] referral should be made while pt is at [facility].   Additional comments: Patient has hx of bipolar disorder. She has recently had her Lithium dose increased from 300 mg once a day to 900mg  BID. In the past few days she has self disconitued oh her anti-psychotic medications except for lesion which she actually has taking some extra doses of. Patient started to feel somewhat agitated and felt that extra dose of lithium would help. She started to report worsening first and felt that she had trouble urinating. Patient had had some unspecified chest pain which she cannot describe further. She presented to emergency department. The patient was not found to have any evidence of urinary retention. She had 3-6 white blood cells in the urine but otherwise unremarkable. Her lithium was noted to be at 1.55. Given mild confusion lithium toxicity was suspected.  Lithium for the past 37 years. Hospitalist was called for admission for lithium toxicity mild. Reason for Continuation of Hospitalization: Medication stabilization Mood  stabilization Estimated length of stay: 3-5 days  For review of initial/current patient goals, please see plan of care.  Attendees:  Patient:    Family:    Physician: Dr. Shea Evans MD 05/04/2014 11:17 AM   Nursing: Trinna Post RN 05/04/2014 11:17 AM   Clinical Social Worker [place], [place]  05/04/2014 11:17 AM   Other: Larry Sierras 05/04/2014 11:17 AM   Other: Roque Lias, LCSW  05/04/2014 11:17 AM   Other: Edwyna Shell, LCSW 05/04/2014 11:17 AM   Other: Bonnye Fava, [place] Intern  05/04/2014 11:17 AM   Scribe for Treatment Team:  [place] LCSWA  05/04/2014 11:18 AM

## 2014-05-04 NOTE — BHH Group Notes (Signed)
Adult Psychoeducational Group Note  Date:  05/04/2014 Time:  11:05 AM  Group Topic/Focus:  Self Care:   The focus of this group is to help patients understand the importance of self-care in order to improve or restore emotional, physical, spiritual, interpersonal, and financial health.  Participation Level:  Did Not Attend  [name] 05/04/2014, 11:05 AM

## 2014-05-04 NOTE — Telephone Encounter (Signed)
Message copied by Lauralee Evener on Thu May 04, 2014  5:00 PM ------      Message from: Shelva Majestic A      Created: Tue Apr 18, 2014  5:20 PM       Nl nuclear study; EF 77% ------
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
## 2014-05-04 NOTE — Progress Notes (Signed)
Patient ID: [name], female   DOB: 01-08-1961, 53 y.o.   MRN: [id number] D: Patient pacing up and down hallway. Pt reports she has been doing so all day. Pt stated her day was better because she saw the MD today. Pt is paranoid about her medication and reports Seroquel is going to crash her body so she needs benadryl. Pt denies suicidal /homicidal ideation intent and plan. Pt denies auditory and visual hallucination. Cooperative with assessment. No acute distressed noted at this time.   A: Met with pt 1:1. Medications administered as prescribed. Writer encouraged pt to discuss feelings. Pt encouraged to come to staff with any questions or concerns.   R: Patient is safe on the unit. She is complaint with medications and denies any adverse reaction. Continue current POC.

## 2014-05-04 NOTE — [facility] Suicide Risk Assessment (Signed)
   Nursing information obtained from:    Demographic factors:    Current Mental Status:    Loss Factors:    Historical Factors:    Risk Reduction Factors:    Total Time spent with patient: 20 minutes  CLINICAL FACTORS:   Previous Psychiatric Diagnoses and Treatments Medical Diagnoses and Treatments/Surgeries  Psychiatric Specialty Exam: Physical Exam  Constitutional: She is oriented to person, place, and time. She appears well-developed and well-nourished.  HENT:  Head: Normocephalic and atraumatic.  Eyes: Conjunctivae are normal. Pupils are equal, round, and reactive to light.  Neck: Normal range of motion.  Cardiovascular: Normal rate.   Respiratory: Effort normal.  GI: Soft.  Musculoskeletal: Normal range of motion.  Neurological: She is alert and oriented to person, place, and time.  Skin: Skin is warm.  Psychiatric: Her speech is normal and behavior is normal. Her mood appears anxious. Her affect is labile. Thought content is paranoid. Cognition and memory are normal. She expresses impulsivity. She expresses no homicidal and no suicidal ideation.    Review of Systems  Constitutional: Negative.   HENT: Negative.   Eyes: Negative.   Respiratory: Negative.   Cardiovascular: Negative.   Gastrointestinal: Negative.   Genitourinary: Negative.   Musculoskeletal: Negative.   Skin: Negative.   Neurological: Negative.   Endo/Heme/Allergies: Negative.   Psychiatric/Behavioral: Positive for depression. Negative for suicidal ideas, hallucinations and substance abuse. The patient is nervous/anxious and has insomnia.     Blood pressure 135/75, pulse 88, temperature 98.4 F (36.9 C), temperature source Oral, resp. rate 20, height 5' (1.524 m), weight 68.04 kg (150 lb).Body mass index is 29.3 kg/(m^2).   See H&P for mental status examination     COGNITIVE FEATURES THAT CONTRIBUTE TO RISK:  Closed-mindedness Polarized thinking Thought constriction (tunnel vision)  SUICIDE  RISK:   Minimal: No identifiable suicidal ideation.  Patients presenting with no risk factors but with morbid ruminations; may be classified as minimal risk based on the severity of the depressive symptoms  PLAN OF CARE:  I certify that inpatient services furnished can reasonably be expected to improve the patient's condition.  Harkirat Orozco 05/04/2014, 2:25 PM

## 2014-05-04 NOTE — Progress Notes (Signed)
Patient ID: [name], female   DOB: 1961-04-07, 53 y.o.   MRN: [id number] D: Patient left group early stating "scared of the person during group". Pt seen paranoid looking over her shoulder when walking. Pt denies suicidal /homicidal ideation intent and plan. Pt denies auditory and visual hallucination. Cooperative with assessment. No acute distressed noted at this time.   A: Met with pt 1:1. Medications administered as prescribed. Writer encouraged pt to discuss feelings. Pt encouraged to come to staff with any questions or concerns.   R: Patient is safe on the unit. She is complaint with medications and denies any adverse reaction. Continue current POC.

## 2014-05-05 MED ORDER — GABAPENTIN 300 MG PO CAPS
300.0000 mg | ORAL_CAPSULE | Freq: Three times a day (TID) | ORAL | Status: DC
Start: 2014-05-05 — End: 2014-05-17
  Administered 2014-05-05 – 2014-05-17 (×32): 300 mg via ORAL
  Filled 2014-05-05: qty 9
  Filled 2014-05-05 (×15): qty 1
  Filled 2014-05-05: qty 9
  Filled 2014-05-05 (×6): qty 1
  Filled 2014-05-05: qty 9
  Filled 2014-05-05 (×2): qty 1
  Filled 2014-05-05: qty 9
  Filled 2014-05-05 (×3): qty 1
  Filled 2014-05-05: qty 9
  Filled 2014-05-05 (×10): qty 1
  Filled 2014-05-05: qty 9
  Filled 2014-05-05 (×2): qty 1

## 2014-05-05 MED ORDER — OLANZAPINE 5 MG PO TBDP
5.0000 mg | ORAL_TABLET | Freq: Every day | ORAL | Status: DC
  Administered 2014-05-05: 5 mg via ORAL
  Filled 2014-05-05 (×3): qty 1

## 2014-05-05 NOTE — [facility] Group Notes (Signed)
[facility] LCSW Aftercare Discharge Planning Group Note   05/05/2014 9:48 AM  Participation Quality:  Made a brief appearance.  Left soon therafter    [name]

## 2014-05-05 NOTE — Progress Notes (Signed)
D: Patient has blunted affect and anxious mood. She reported on the self inventory sheet that she's sleeping fair, appetite and ability to concentrate are good and energy level is high. Patient rates depression/feelings of hopelessness "2" and anxiety "6-8". Writer has observed the patient going in and out of the dayroom continuously throughout the morning. Patient refused to take a scheduled multivitamin this morning and also appeared very hesitant to take Neurontin until she saw the writer open the medication directly in front of her. Patient adheres to current medication regimen. Also, patient refused afternoon Neurontin stating "I just don't think it's helping me because I feel like it's making me more anxious and revved up". Writer informed MD, Eappen regarding this info.  A: Support and encouragement provided to patient. Administered scheduled medications per ordering MD. Monitor Q15 minute checks for safety.  R: Patient receptive. Denies SI/HI/AVH. Patient remains safe on the unit.
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
## 2014-05-05 NOTE — Plan of Care (Signed)
Problem: Ineffective individual coping Goal: STG: Patient will remain free from self harm Outcome: Progressing Pt is safe and denies SI

## 2014-05-05 NOTE — [facility] Group Notes (Signed)
[facility] LCSW Group Therapy  05/05/2014 2:51 PM  Type of Therapy:  Group Therapy  Participation Level:  Did Not Attend-pt refused to attend group, telling CSW that she was having chest pains. RN notified. Pt agitated during interaction.   Smart, Neven Fina LCSWA  05/05/2014, 2:51 PM

## 2014-05-05 NOTE — Progress Notes (Signed)
[facility] Group Notes:  (Nursing/MHT/Case Management/Adjunct)  Date:  05/05/2014  Time:  9:43 PM  Type of Therapy:  Group Therapy  Participation Level:  Minimal  Participation Quality:  Appropriate  Affect:  Appropriate and Flat  Cognitive:  Alert  Insight:  Good  Engagement in Group:  Developing/Improving  Modes of Intervention:  Socialization and Support  Summary of Progress/Problems: Pt. Stated she tries to think positive. Pt. Stated prayer was a coping skill for relapse prevention.  Lanell Persons 05/05/2014, 9:43 PM

## 2014-05-06 DIAGNOSIS — F603 Borderline personality disorder: Secondary | ICD-10-CM

## 2014-05-06 DIAGNOSIS — F459 Somatoform disorder, unspecified: Secondary | ICD-10-CM

## 2014-05-06 DIAGNOSIS — F09 Unspecified mental disorder due to known physiological condition: Secondary | ICD-10-CM

## 2014-05-06 LAB — FOLATE

## 2014-05-06 LAB — VITAMIN B12: Vitamin B-12: 466 pg/mL (ref 211–911)

## 2014-05-06 MED ORDER — OLANZAPINE 5 MG PO TABS
5.0000 mg | ORAL_TABLET | Freq: Every day | ORAL | Status: DC
  Administered 2014-05-07: 5 mg via ORAL
  Filled 2014-05-06 (×4): qty 1

## 2014-05-06 MED ORDER — OLANZAPINE 10 MG PO TBDP
5.0000 mg | ORAL_TABLET | Freq: Three times a day (TID) | ORAL | Status: DC | PRN
  Administered 2014-05-06 – 2014-05-08 (×8): 5 mg via ORAL
  Filled 2014-05-06 (×6): qty 1

## 2014-05-06 MED ORDER — DOCUSATE SODIUM 100 MG PO CAPS
100.0000 mg | ORAL_CAPSULE | Freq: Two times a day (BID) | ORAL | Status: DC
  Administered 2014-05-07 – 2014-05-17 (×21): 100 mg via ORAL
  Filled 2014-05-06 (×28): qty 1

## 2014-05-06 MED ORDER — OLANZAPINE 5 MG PO TBDP
ORAL_TABLET | ORAL | Status: AC
  Filled 2014-05-06: qty 1
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
## 2014-05-06 NOTE — Progress Notes (Signed)
Patient ID: [name], female   DOB: [birth year], 53 y.o.   MRN: [id number] D: Patient denies SI/HI/AVH. Pt reports "body is not responding to medication" because she had lithium toxicity. Cooperative with assessment. No acute distressed noted at this time.   A: Met with pt 1:1. Medications administered as prescribed. Writer encouraged pt to discuss feelings. Pt encouraged to come to staff with any questions or concerns.   R: Patient is safe on the unit. She is complaint with medications and denies any adverse reaction. Continue current POC.

## 2014-05-06 NOTE — BHH Group Notes (Signed)
[facility] Group Notes:  (Clinical Social Work)  05/06/2014  11:15-12:00PM  Summary of Progress/Problems:   The main focus of today's process group was to discuss patients' feelings about hospitalization, the stigma attached to mental health, and sources of motivation to stay well.  We then worked to identify a specific plan to avoid future hospitalizations when discharged from the hospital for this admission.  The patient expressed hesitancy to talk about why she is in the hospital, then said "lithium toxicity."  She did not want to talk further, sat with an incongruent smile on her face, but did listen attentively to entire discussion.  The only thing she really contributed to group was to ask about the 72-hour discharge request paper.  CSW then did group education on that subject.  Type of Therapy:  Group Therapy - Process  Participation Level:  Active  Participation Quality:  Attentive  Affect:  Not Congruent  Cognitive:  Alert  Insight:  Improving  Engagement in Therapy:  Improving  Modes of Intervention:  Exploration, Discussion  Selmer Dominion, LCSW 05/06/2014, 1:25 PM

## 2014-05-06 NOTE — Progress Notes (Signed)
Psychoeducational Group Note  Date:  05/06/2014 Time:  2130  Group Topic/Focus:  Wrap-Up Group:   The focus of this group is to help patients review their daily goal of treatment and discuss progress on daily workbooks.  Participation Level: Did Not Attend  Participation Quality:  Not Applicable  Affect:  Not Applicable  Cognitive:  Not Applicable  Insight:  Not Applicable  Engagement in Group: Not Applicable  Additional Comments:  The patient did not attend group this evening.   Nikhil Osei S 05/06/2014, 9:30 PM
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
## 2014-05-06 NOTE — BHH Group Notes (Signed)
[facility] Group Notes:  (Nursing/MHT/Case Management/Adjunct)  Date:  05/06/2014  Time:  1:12 PM  Type of Therapy:  Psychoeducational Skills- Patient Self Inventory Group  Participation Level:  Minimal  Participation Quality:  Resistant  Affect:  Flat  Cognitive:  Disorganized  Insight:  Lacking  Engagement in Group:  Lacking  Modes of Intervention:  Problem-solving  Summary of Progress/Problems: Pt attended patient self inventory group.  Benancio Deeds Shanta 05/06/2014, 1:12 PM
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
## 2014-05-06 NOTE — BHH Group Notes (Signed)
[facility] Group Notes:  (Nursing/MHT/Case Management/Adjunct)  Date:  05/06/2014  Time:  1:14 PM  Type of Therapy:  Psychoeducational Skills- Healthy Coping Skills Group  Participation Level:  Minimal  Participation Quality:  Resistant  Affect:  Flat  Cognitive:  Disorganized  Insight:  Lacking  Engagement in Group:  Lacking  Modes of Intervention:  Problem-solving  Summary of Progress/Problems: Pt attended patient healthy coping skills group. Benancio Deeds Shanta 05/06/2014, 1:14 PM

## 2014-05-06 NOTE — Progress Notes (Signed)
Patient ID: [name], female   DOB: [birth year], 53 y.o.   MRN: [id number] [facility] MD Progress Note  05/06/2014 12:26 PM [name]  MRN:  [id number]  Subjective: [name] reports, "I'm not feeling good at this moment. I think my medicines are not well regulated. My memory is gone. I can't remember my family or  Friends; phone numbers to contact them. Zyprexa works for me, I wish I can get more of it "  Objective: Patient seen and chart reviewed. [name] is endorsing memory problems. Says she is having bad anxiety symptoms. Feels she is not getting any better. Says Zyprexa is working so far, but has not done the job she is expecting.  Wants the nurse to call her family, then allow her to speak with the family so she can give them the pass code to contact here. She currently denies any SIHI, AVH. [name] states that the lithium overdose was an accident. The reports that [name] is refusing her medications, except the Zyprexa.  Diagnosis:   DSM5:  Primary psychiatric diagnosis:  Schizoaffective disorder,bipolar type,multiple episodes ,currently in acute episode   Secondary psychiatric diagnosis:  Somatic symptom disorder,persistent,moderate  Major neurocognitive disorder, unspecified ,likely secondary to her psychiatric diagnosis,medical problems ,age ,polypharmacy Borderline personality disorder   Non psychiatric diagnosis:  Lithium toxicity (recent) Hx of generalized weakness (all work up wnl)  IBS  Hx of duodenal ulcer  Hx of urinary problems   Total Time spent with patient: 30 minutes   ADL's:  Intact  Sleep: Fair  Appetite:  Fair  S Psychiatric Specialty Exam: Physical Exam  Constitutional: She is oriented to person, place, and time. She appears well-developed and well-nourished.  Eyes: Pupils are equal, round, and reactive to light.  Neck: Normal range of motion.  Cardiovascular: Normal rate.   Respiratory: Effort normal.  GI: Soft.  Musculoskeletal: Normal range of motion.   Neurological: She is alert and oriented to person, place, and time.  Skin: Skin is warm.  Psychiatric: Her speech is normal. Her mood appears anxious. Her affect is labile. She is slowed. Thought content is paranoid and delusional. Cognition and memory are impaired. She expresses impulsivity. She expresses no homicidal and no suicidal ideation.    Review of Systems  Constitutional: Negative.   HENT: Negative.   Eyes: Negative.   Respiratory: Negative.   Cardiovascular: Negative.   Gastrointestinal: Negative.   Genitourinary: Negative.   Musculoskeletal: Negative.   Skin: Negative.   Psychiatric/Behavioral: Positive for depression. Negative for suicidal ideas, hallucinations and substance abuse. The patient is nervous/anxious and has insomnia.     Blood pressure 130/75, pulse 86, temperature 99.1 F (37.3 C), temperature source Oral, resp. rate 20, height 5' (1.524 m), weight 68.04 kg (150 lb).Body mass index is 29.3 kg/(m^2).  General Appearance: Casual  Eye Contact::  Fair  Speech:  Normal Rate  Volume:  Decreased  Mood:  Anxious,  depressed  Affect:  Congruent  Thought Process:  Disorganized and Irrelevant  Orientation:  Full (Time, Place, and Person)  Thought Content:  Delusions and Paranoid Ideation  Suicidal Thoughts:  No  Homicidal Thoughts:  No  Memory:  Immediate;   Fair Recent;   Fair Remote;   Poor  Judgement:  Impaired  Insight:  Lacking  Psychomotor Activity:  Restlessness,  anxious  Concentration:  Fair  Recall:  Evan: Fair  Akathisia:  No    AIMS (if indicated):     Assets:  Communication  Skills  Sleep:  Number of Hours: 2.5   Musculoskeletal: Strength & Muscle Tone: within normal limits Gait & Station: normal Patient leans: N/A  Current Medications: Current Facility-Administered Medications  Medication Dose Route Frequency Provider Last Rate Last Dose  . acetaminophen (TYLENOL) tablet 650 mg  650 mg Oral Q6H PRN  Ursula Alert, MD      . alum & mag hydroxide-simeth (MAALOX/MYLANTA) 200-200-20 MG/5ML suspension 30 mL  30 mL Oral Q4H PRN Saramma Eappen, MD      . diphenhydrAMINE (BENADRYL) capsule 25 mg  25 mg Oral Q8H PRN Ursula Alert, MD   25 mg at 05/04/14 2206  . gabapentin (NEURONTIN) capsule 300 mg  300 mg Oral TID Ursula Alert, MD   300 mg at 05/05/14 1637  . hydrOXYzine (ATARAX/VISTARIL) tablet 25 mg  25 mg Oral Q6H PRN Benjamine Mola, FNP   25 mg at 05/04/14 7829  . magnesium hydroxide (MILK OF MAGNESIA) suspension 30 mL  30 mL Oral Daily PRN Ursula Alert, MD   30 mL at 05/06/14 0801  . multivitamin with minerals tablet 1 tablet  1 tablet Oral Daily Ursula Alert, MD   1 tablet at 05/06/14 0801  . OLANZapine (ZYPREXA) tablet 5 mg  5 mg Oral QHS Encarnacion Slates, NP      . OLANZapine zydis (ZYPREXA) disintegrating tablet 5 mg  5 mg Oral TID PRN Encarnacion Slates, NP        Lab Results: No results found for this or any previous visit (from the past 48 hour(s)).  Physical Findings: AIMS:  , ,  ,  ,    CIWA:    COWS:     Treatment Plan Summary: Daily contact with patient to assess and evaluate symptoms and progress in treatment Medication management  Plan: 1. Continue crisis management and mood stabilization. 2. Continue current medication management to reduce current symptoms to base line and improve the  patient's overall level of functioning; Hydroxyzine 25 mg for anxiety, Olanzepine 5 mg for mood control/prn for agitation, Gabapentin 300 mg three times daily. 3. Treat health problems as indicated; Colace 100 mg daily for constipation,. 4. Develop treatment plan to decrease risk of flare-up or exacerbation of symptoms after discharge and the need for  readmission. 5. Psycho-social education regarding medication adherence & self care.  Will start working on disposition. Patient to be referred to [facility] as well as possible ALF placement. CSW will work on this.    Patient  had SLUMS examination done today (05/05/14) - Patient scored 14/30 -Patient  has Major neurocognitive disorder due to multiple etiologies. Will check labs . Patient will benefit from a supervised ,structured environment.  Medical Decision Making Problem Points:  Established problem, stable/improving (1), New problem, with additional work-up planned (4) and Review of last therapy session (1) Data Points:  Review of medication regiment & side effects (2) Review of new medications or change in dosage (2)  I certify that inpatient services furnished can reasonably be expected to improve the patient's condition.   Encarnacion Slates, [place] 05/06/2014, 12:26 PM   I agreed with the findings, treatment and disposition plan of this patient. Berniece Andreas, MD

## 2014-05-07 LAB — URINALYSIS, ROUTINE W REFLEX MICROSCOPIC
Bilirubin Urine: NEGATIVE
Glucose, UA: NEGATIVE mg/dL
Hgb urine dipstick: NEGATIVE
KETONES UR: NEGATIVE mg/dL
Leukocytes, UA: NEGATIVE
NITRITE: NEGATIVE
PH: 6 (ref 5.0–8.0)
Protein, ur: NEGATIVE mg/dL
Specific Gravity, Urine: 1.009 (ref 1.005–1.030)
Urobilinogen, UA: 0.2 mg/dL (ref 0.0–1.0)

## 2014-05-07 MED ORDER — MAGNESIUM CITRATE PO SOLN
1.0000 | Freq: Once | ORAL | Status: AC
  Administered 2014-05-07: 1 via ORAL

## 2014-05-07 NOTE — BHH Group Notes (Signed)
[facility] Group Notes:  (Nursing/MHT/Case Management/Adjunct)  Date:  05/07/2014  Time:  11:39 AM  Type of Therapy:  Psychoeducational Skills  Participation Level:  Minimal  Participation Quality:  Intrusive  Affect:  Anxious and Blunted  Cognitive:  Disorganized  Insight:  Lacking  Engagement in Group:  Lacking  Modes of Intervention:  Problem-solving  Summary of Progress/Problems: Pt attended patient self inventory group.  [name] 05/07/2014, 11:39 AM
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
## 2014-05-07 NOTE — BHH Group Notes (Signed)
[facility] Group Notes:  (Nursing/MHT/Case Management/Adjunct)  Date:  05/07/2014  Time:  11:37 AM  Type of Therapy:  Psychoeducational Skills  Participation Level:  Minimal  Participation Quality:  Intrusive  Affect:  Anxious and Blunted  Cognitive:  Disorganized  Insight:  Lacking  Engagement in Group:  Lacking  Modes of Intervention:  Problem-solving  Summary of Progress/Problems: Pt attended healthy support systems group.   Benancio Deeds Shanta 05/07/2014, 11:37 AM

## 2014-05-07 NOTE — Progress Notes (Signed)
Patient ID: [name], female   DOB: 10/09/1960, 53 y.o.   MRN: [id number]   D: Pt on the unit reporting that she needed something to have a bowel movement, patient reported that [place] NP told her that she would order something for her. New order noted from [place] NP to give mag citrate, attempted to give patient took one sip and stated that it was poison.  Pt continues to be very disorganized and very delusional. Pt also continues to very intrusive and continues to invade the personal space of her peers. Pt continues to require frequent redirection. A: 1:1 continued for patient safety. R: Pts safety maintained.

## 2014-05-07 NOTE — Progress Notes (Signed)
[facility] Group Notes:  (Nursing/MHT/Case Management/Adjunct)  Date:  05/07/2014  Time:  11:39 PM  Type of Therapy:  Psychoeducational Skills  Participation Level:  Active  Participation Quality:  Appropriate  Affect:  Flat  Cognitive:  Appropriate  Insight:  Improving  Engagement in Group:  Improving  Modes of Intervention:  Education  Summary of Progress/Problems: The patient had multiple complaints in group. The patient verbalized that she is experiencing a weak leg. In addition, she verbalized that she is experiencing psychotic symptoms. On a more positive note, she did mention that she attended all of her groups. In terms of the theme for the day, her support system will be made up of her sister and mother.   Archie Balboa S 05/07/2014, 11:39 PM
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
## 2014-05-07 NOTE — Progress Notes (Signed)
Patient ID: [name], female   DOB: 05-May-1961, 53 y.o.   MRN: [id number]   D: Pt on the unit and continues to report to staff and peers that she has a bladder blockage. Aggie NP was made aware, orders were noted to get urine sample. Pt was happy about the NP ordering a urine sample, urine sample was collected. Pt continues to be very disorganized and very delusional. Pt also continues to very intrusive and continues to invade the personal space of her peers. Pt continues to require frequent redirection, however did come up to the nurses requesting her medications. All medications were given, patient took medication without any problems. A: 1:1 continued for patient safety. R: Pts safety maintained.

## 2014-05-07 NOTE — Progress Notes (Signed)
Patient ID: [name], female   DOB: 1961-02-10, 53 y.o.   MRN: [id number] Patient ID: [name], female   DOB: Dec 26, 1960, 53 y.o.   MRN: [id number] [facility] MD Progress Note  05/07/2014 2:57 PM [name]  MRN:  [id number]  Subjective: [name] reports, "I've got to keep pushing, keep trying for everything is going to be okay"  Objective: Patient seen and chart reviewed. [name] says her ureter/urethra has a stricture in it. She states that it burns when she urinates. She remains on 1:1 due to disruptive behavior. Today, [name] presents with good affects, casually interacting with staff. She is no apparent distress.  medications, except the Zyprexa.  Diagnosis:   DSM5:  Primary psychiatric diagnosis:  Schizoaffective disorder,bipolar type,multiple episodes ,currently in acute episode   Secondary psychiatric diagnosis:  Somatic symptom disorder,persistent,moderate  Major neurocognitive disorder, unspecified ,likely secondary to her psychiatric diagnosis,medical problems ,age ,polypharmacy Borderline personality disorder   Non psychiatric diagnosis:  Lithium toxicity (recent) Hx of generalized weakness (all work up wnl)  IBS  Hx of duodenal ulcer  Hx of urinary problems   Total Time spent with patient: 30 minutes   ADL's:  Intact  Sleep: Fair  Appetite:  Fair  S Psychiatric Specialty Exam: Physical Exam  Constitutional: She is oriented to person, place, and time. She appears well-developed and well-nourished.  Eyes: Pupils are equal, round, and reactive to light.  Neck: Normal range of motion.  Cardiovascular: Normal rate.   Respiratory: Effort normal.  GI: Soft.  Musculoskeletal: Normal range of motion.  Neurological: She is alert and oriented to person, place, and time.  Skin: Skin is warm.  Psychiatric: Her speech is normal. Her mood appears anxious. Her affect is labile. She is slowed. Thought content is paranoid and delusional. Cognition and memory are  impaired. She expresses impulsivity. She expresses no homicidal and no suicidal ideation.    Review of Systems  Constitutional: Negative.   HENT: Negative.   Eyes: Negative.   Respiratory: Negative.   Cardiovascular: Negative.   Gastrointestinal: Negative.   Genitourinary: Negative.   Musculoskeletal: Negative.   Skin: Negative.   Psychiatric/Behavioral: Positive for depression. Negative for suicidal ideas, hallucinations and substance abuse. The patient is nervous/anxious and has insomnia.     Blood pressure 143/84, pulse 87, temperature 98.4 F (36.9 C), temperature source Oral, resp. rate 16, height 5' (1.524 m), weight 68.04 kg (150 lb).Body mass index is 29.3 kg/(m^2).  General Appearance: Casual  Eye Contact::  Fair  Speech:  Normal Rate  Volume:  Decreased  Mood:  Anxious,  depressed  Affect:  Congruent  Thought Process:  Disorganized and Irrelevant  Orientation:  Full (Time, Place, and Person)  Thought Content:  Delusions and Paranoid Ideation  Suicidal Thoughts:  No  Homicidal Thoughts:  No  Memory:  Immediate;   Fair Recent;   Fair Remote;   Poor  Judgement:  Impaired  Insight:  Lacking  Psychomotor Activity:  Restlessness,  anxious  Concentration:  Fair  Recall:  [place]: Fair  Akathisia:  No    AIMS (if indicated):     Assets:  Communication Skills  Sleep:  Number of Hours: 1.25   Musculoskeletal: Strength & Muscle Tone: within normal limits Gait & Station: normal Patient leans: N/A  Current Medications: Current Facility-Administered Medications  Medication Dose Route Frequency Provider Last Rate Last Dose  . acetaminophen (TYLENOL) tablet 650 mg  650 mg Oral Q6H PRN Ursula Alert, MD      .  alum & mag hydroxide-simeth (MAALOX/MYLANTA) 200-200-20 MG/5ML suspension 30 mL  30 mL Oral Q4H PRN Saramma Eappen, MD      . diphenhydrAMINE (BENADRYL) capsule 25 mg  25 mg Oral Q8H PRN Ursula Alert, MD   25 mg at 05/07/14 0030   . docusate sodium (COLACE) capsule 100 mg  100 mg Oral BID Encarnacion Slates, NP   100 mg at 05/07/14 0755  . gabapentin (NEURONTIN) capsule 300 mg  300 mg Oral TID Ursula Alert, MD   300 mg at 05/07/14 1231  . hydrOXYzine (ATARAX/VISTARIL) tablet 25 mg  25 mg Oral Q6H PRN Benjamine Mola, FNP   25 mg at 05/04/14 8756  . magnesium hydroxide (MILK OF MAGNESIA) suspension 30 mL  30 mL Oral Daily PRN Ursula Alert, MD   30 mL at 05/06/14 0801  . multivitamin with minerals tablet 1 tablet  1 tablet Oral Daily Ursula Alert, MD   1 tablet at 05/07/14 0755  . OLANZapine (ZYPREXA) tablet 5 mg  5 mg Oral QHS Encarnacion Slates, NP      . OLANZapine zydis (ZYPREXA) disintegrating tablet 5 mg  5 mg Oral TID PRN Encarnacion Slates, NP   5 mg at 05/07/14 1217    Lab Results:  Results for orders placed during the hospital encounter of 05/03/14 (from the past 48 hour(s))  VITAMIN B12     Status: None   Collection Time    05/06/14  6:30 AM      Result Value Ref Range   Vitamin B-12 466  211 - 911 pg/mL   Comment: Performed at [facility]     Status: None   Collection Time    05/06/14  6:30 AM      Result Value Ref Range   Folate >20.0     Comment: (NOTE)     Reference Ranges            Deficient:       0.4 - 3.3 ng/mL            Indeterminate:   3.4 - 5.4 ng/mL            Normal:              > 5.4 ng/mL     Performed at [facility]    Physical Findings: AIMS: Facial and Oral Movements Muscles of Facial Expression: None, normal Lips and Perioral Area: None, normal Jaw: None, normal Tongue: None, normal,Extremity Movements Upper (arms, wrists, hands, fingers): None, normal Lower (legs, knees, ankles, toes): None, normal, Trunk Movements Neck, shoulders, hips: None, normal, Overall Severity Severity of abnormal movements (highest score from questions above): None, normal Incapacitation due to abnormal movements: None, normal Patient's awareness of abnormal movements (rate  only patient's report): No Awareness, Dental Status Current problems with teeth and/or dentures?: No Does patient usually wear dentures?: No  CIWA:    COWS:     Treatment Plan Summary: Daily contact with patient to assess and evaluate symptoms and progress in treatment Medication management  Plan: 1. Continue crisis management and mood stabilization. 2. Continue current medication management to reduce current symptoms to base line and improve the  patient's overall level of functioning; Hydroxyzine 25 mg for anxiety, Olanzepine 5 mg for mood control/prn for agitation, Gabapentin 300 mg three times daily. 3. Treat health problems as indicated; Colace 100 mg daily for constipation,. 4. Develop treatment plan to decrease risk of flare-up or exacerbation of symptoms  after discharge and the need for  readmission. 5. Psycho-social education regarding medication adherence & self care. 6. Obtain U/A. Continue 1:1 supervision for safety.  Will start working on disposition. Patient to be referred to [place] as well as possible ALF placement. CSW will work on this.    Patient had SLUMS examination done today (05/05/14) - Patient scored 14/30 -Patient  has Major neurocognitive disorder due to multiple etiologies. Will check labs . Patient will benefit from a supervised ,structured environment.  Medical Decision Making Problem Points:  Established problem, stable/improving (1), New problem, with additional work-up planned (4) and Review of last therapy session (1) Data Points:  Review of medication regiment & side effects (2) Review of new medications or change in dosage (2)  I certify that inpatient services furnished can reasonably be expected to improve the patient's condition.   Lindell Spar I, [place] 05/07/2014, 2:57 PM  I agreed with the findings, treatment and disposition plan of this patient. Berniece Andreas, MD

## 2014-05-07 NOTE — Progress Notes (Signed)
D: Pt presents with pleasant affect and mood.  Pt denies SI/HI.  Pt denies hallucinations.  Pt reports "it's going to be a good night.  It has been a good day.  I'm glad I have her with me" referring to 1:1 staff.  Pt paces hallway at times.  Pt is intrusive but redirectable. A: Encouraged and supported pt.  Safety maintained with 1:1 staff as ordered.  Medications administered per order. R: Pt was hesitant to take HS medication, insisting to look at the package before she would take the medication.  Education pt on medication regimen.   Pt was compliant with HS medication.  Will continue to monitor 1:1 for safety as ordered.

## 2014-05-07 NOTE — BHH Group Notes (Signed)
[place] Group Notes:  (Clinical Social Work)  05/07/2014   11:15am-12:00pm  Summary of Progress/Problems:  The main focus of today's process group was to listen to a variety of genres of music and to identify that different types of music provoke different responses.  The patient then was able to identify personally what was soothing for them, as well as energizing.  Handouts were used to record feelings evoked, as well as how patient can personally use this knowledge in sleep habits, with depression, and with other symptoms.  The patient expressed understanding of how each type of music affected her.  Two separate times she went to her 1:1 MHT and asked that the MHT approach clinician about stopping a particular song that was making her sad.  She was asked to go directly to the clinician, but did not like doing so.  She acted several times like she was going to leave the group and clinician cajoled her into staying.  She danced to one song, smiled consistently to that and several other songs, but seemed sad with other songs.  She did complete the paperwork provided to her, recording her reactions to various types of music.  Type of Therapy:  Music Therapy   Participation Level:  Active  Participation Quality:  Attentive   Affect:  Labile  Cognitive:  Disorganized  Insight:  Improving  Engagement in Therapy:  Improving  Modes of Intervention:   Activity, Exploration  Selmer Dominion, LCSW 05/07/2014, 12:30pm

## 2014-05-08 MED ORDER — OLANZAPINE 10 MG PO TBDP
5.0000 mg | ORAL_TABLET | Freq: Two times a day (BID) | ORAL | Status: DC | PRN
  Administered 2014-05-08 – 2014-05-16 (×6): 5 mg via ORAL
  Filled 2014-05-08 (×7): qty 1

## 2014-05-08 MED ORDER — OLANZAPINE 10 MG PO TABS
10.0000 mg | ORAL_TABLET | Freq: Every day | ORAL | Status: DC
  Administered 2014-05-08: 10 mg via ORAL
  Filled 2014-05-08 (×2): qty 1

## 2014-05-08 MED ORDER — BENZTROPINE MESYLATE 0.5 MG PO TABS
0.5000 mg | ORAL_TABLET | Freq: Every day | ORAL | Status: DC
  Administered 2014-05-08 – 2014-05-16 (×8): 0.5 mg via ORAL
  Filled 2014-05-08: qty 1
  Filled 2014-05-08: qty 3
  Filled 2014-05-08 (×6): qty 1
  Filled 2014-05-08: qty 3
  Filled 2014-05-08 (×2): qty 1

## 2014-05-08 NOTE — BHH Group Notes (Signed)
[facility] LCSW Group Therapy  05/08/2014 3:59 PM  Type of Therapy:  Group Therapy  Participation Level:  Active but left after 10 minutes   Participation Quality:  Attentive and disruptive   Affect:  Excited  Cognitive:  Lacking  Insight:  None  Engagement in Therapy:  Distracting and Monopolizing  Modes of Intervention:  Confrontation, Discussion, Education, Exploration, Problem-solving, Rapport Building, Socialization and Support  Summary of Progress/Problems: Today's Topic: Overcoming Obstacles. Pt identified obstacles faced currently and processed barriers involved in overcoming these obstacles. Pt identified steps necessary for overcoming these obstacles and explored motivation (internal and external) for facing these difficulties head on. Pt further identified one area of concern in their lives and chose a skill of focus pulled from their "toolbox." Zaylah was attentive during today's group but tended to distract and interrupt others. She was easily redirected and remained pleasant during group stating, "I'm doing the best I can." Willadeen attempted to provide other pt's with positive encouragement but was inappropriate with responses. Rayshell left group to "rest her head" after approximately ten minutes.    Smart, Arjan Strohm LCSWA  05/08/2014, 3:59 PM

## 2014-05-08 NOTE — Progress Notes (Signed)
D: Pt is resting in room with eyes closed.  Respirations are even, unlabored and WNL.   A: 1:1 staff remains with pt per MD order. R: Pt is in no distress at this time.  Will continue to monitor 1:1 per order.

## 2014-05-08 NOTE — Progress Notes (Signed)
Patient ID: [name], female   DOB: 07-25-1961, 53 y.o.   MRN: [id number]  D: Patient has a fixed smile this am. Reports poor sleep during the night and staff reported that she was trying to go into others rooms. Reported to undersigned she was on 1:1 because she was not sleeping and wandering at night. Still intrusive around others and still needs redirection. Patient is currently a placement option due to doctor feeling that she does not need to be on her own. Reports that she will talk about to doctor about med school and taking care of others. Thinking disorganized and has some flight of ideas when speaking with her. A: Staff will monitor on 1:1 for safety due to intrusive and sexually inappropriate behavior toward other peers. R: Cooperative with 1:1 at this time. Paranoid about medications. Made undersigned get more medication and reopen them directly in front of her instead of to the side like I did the first time.

## 2014-05-08 NOTE — Progress Notes (Signed)
Patient ID: [name], female   DOB: 01-20-1961, 53 y.o.   MRN: [id number] D: Client reports "feel like my sister is trying to kill me" "hearing some voices, think they are legitimate voices"  A: Writer introduce self to client, reviews medication schedule. Staff will monitor 1:1  for safety. R: Client is safe on the unit, attends group.
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
## 2014-05-08 NOTE — Progress Notes (Signed)
  D: Patient smiling on approach today. Continues to talk about medications today. Worried about what the doctor has started her on tonight. Keeps talking about [place] but told that she is starting on Zyprexa tonight. Patient constantly wanting to shake staff's hands. Still remains intrusive around others. No sexually inappropriate behavior or conversation heard today. Attempted to get a no roommate order due to her behavior toward previous roommate but administration declined due to patient being observed already on 1:1. A: Staff will monitor on 1:1 due to intrusive behaviors and her comments toward peers. R: Cooperative on the unit.

## 2014-05-08 NOTE — BHH Group Notes (Signed)
[facility] LCSW Aftercare Discharge Planning Group Note   05/08/2014 10:36 AM  Participation Quality:  DID NOT ATTEND-pt refused to attend group  Smart, Artice Holohan LCSWA

## 2014-05-08 NOTE — Plan of Care (Signed)
Problem: Ineffective individual coping Goal: STG: Patient will remain free from self harm Outcome: Progressing Pt has not harmed self this shift.
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
## 2014-05-08 NOTE — Tx Team (Signed)
Interdisciplinary Treatment Plan Update (Adult)   Date: 05/08/2014   Time Reviewed: 10:37 AM  Progress in Treatment:  Attending groups: Yes  Participating in groups:  Yes  Taking medication as prescribed: Yes  Tolerating medication: Yes  Family/Significant othe contact made: None needed. Collateral info provided by pt's friend and pt's mother. CSW left message for pt's sister to call back.  Patient understands diagnosis: Somewhat, AEB seeking treatment for medication stabilization. She continues to minimize mental health issues.  Discussing patient identified problems/goals with staff: Yes  Medical problems stabilized or resolved: Yes  Denies suicidal/homicidal ideation: Yes during admission/self report.  Patient has not harmed self or Others: Yes  New problem(s) identified:  Discharge Plan or Barriers: Pt is hoping to return home and follow-up with Dr. Casimiro Needle. Pt refused to sign consent allowing CSW to refer her for PASRR number. CSW exploring other options at this time. MD is making decision as to make pt involuntary due to mental health deterioration and inability to make sound decisions at this time. Pt was given new dx of major neuro cognitive disorder.  Additional comments:  Reason for Continuation of Hospitalization: Medication stabilization Mood stabilization Estimated length of stay: 3-5 days  For review of initial/current patient goals, please see plan of care.  Attendees:  Patient:    Family:    Physician: Dr. Shea Evans MD 05/08/2014 10:37 AM   Nursing: Oletta Darter RN  05/08/2014 10:37 AM   Clinical Social Worker [place], [place]  05/08/2014 10:37 AM   Other: Sharl Ma RN 05/08/2014 10:37 AM   Other: Roque Lias, [place]  05/08/2014 10:37 AM   Other:  05/08/2014 10:37 AM   Other 05/08/2014 10:37 AM   Scribe for Treatment Team:  Nira Conn Smart LCSWA  05/08/2014 10:37 AM

## 2014-05-08 NOTE — Progress Notes (Signed)
D: Pt has labile affect and mood.  Pt had to be redirected earlier in shift for attempting to enter peer's room.  Pt has fixed smile, states "I'm doing the best I can."  A: 1:1 staff remains with pt per MD order for safety. R: Pt is in no distress at this time.  Pt denies needs at this time.  Pt will continue to be monitored 1:1 for safety.

## 2014-05-09 MED ORDER — ZOLPIDEM TARTRATE 5 MG PO TABS
5.0000 mg | ORAL_TABLET | Freq: Every evening | ORAL | Status: DC | PRN
  Administered 2014-05-09 – 2014-05-10 (×2): 5 mg via ORAL
  Filled 2014-05-09 (×2): qty 1

## 2014-05-09 MED ORDER — LITHIUM CARBONATE 300 MG PO CAPS
300.0000 mg | ORAL_CAPSULE | Freq: Two times a day (BID) | ORAL | Status: DC
  Administered 2014-05-09 – 2014-05-15 (×13): 300 mg via ORAL
  Filled 2014-05-09 (×17): qty 1

## 2014-05-09 MED ORDER — OLANZAPINE 7.5 MG PO TABS
15.0000 mg | ORAL_TABLET | Freq: Every day | ORAL | Status: DC
  Administered 2014-05-09: 7.5 mg via ORAL
  Filled 2014-05-09 (×2): qty 2

## 2014-05-09 NOTE — Progress Notes (Signed)
Patient ID: [name], female   DOB: Jul 13, 1961, 53 y.o.   MRN: [id number] Patient ID: [name], female   DOB: 04-20-1961, 53 y.o.   MRN: [id number] [facility] MD Progress Note  05/09/2014 11:23 AM [name]  MRN:  [id number]  Subjective: [name] reports, "I am stressed out"   Objective: Patient seen and chart reviewed. [name] appears to manic ,with pressured speech today. She is also disorganized ,restless ,making comments to her sitter ,is irritable ,requiring a lot of redirections. Patient denies SI/AH/VH/HI.   Per staff patient is demanding ,threw her food all over her room, pulled at the sitter's clothes and is very irritable.  Diagnosis:   DSM5:  Primary psychiatric diagnosis:  Schizoaffective disorder,bipolar type,multiple episodes ,currently in acute episode   Secondary psychiatric diagnosis:  Somatic symptom disorder,persistent,moderate  Major neurocognitive disorder, due to multiple etiologies ,likely secondary to her psychiatric diagnosis,medical problems ,age ,polypharmacy Borderline personality disorder   Non psychiatric diagnosis:  Lithium toxicity (recent) Hx of generalized weakness (all work up wnl)  IBS  Hx of duodenal ulcer  Hx of urinary problems   Total Time spent with patient: 30 minutes   ADL's:  Intact  Sleep: Fair  Appetite:  Fair  S Psychiatric Specialty Exam: Physical Exam  Constitutional: She is oriented to person, place, and time. She appears well-developed and well-nourished.  Eyes: Pupils are equal, round, and reactive to light.  Neck: Normal range of motion.  Cardiovascular: Normal rate.   Respiratory: Effort normal.  GI: Soft.  Musculoskeletal: Normal range of motion.  Neurological: She is alert and oriented to person, place, and time.  Skin: Skin is warm.  Psychiatric: Her speech is normal. Her mood appears anxious. Her affect is labile. She is slowed. Thought content is paranoid and delusional. Cognition and memory are  impaired. She expresses impulsivity. She expresses no homicidal and no suicidal ideation.    Review of Systems  Constitutional: Negative.   HENT: Negative.   Eyes: Negative.   Respiratory: Negative.   Cardiovascular: Negative.   Gastrointestinal: Negative.   Genitourinary: Negative.   Musculoskeletal: Negative.   Skin: Negative.   Psychiatric/Behavioral: Positive for depression. Negative for suicidal ideas, hallucinations and substance abuse. The patient is nervous/anxious and has insomnia.     Blood pressure 132/79, pulse 109, temperature 98.1 F (36.7 C), temperature source Oral, resp. rate 16, height 5' (1.524 m), weight 68.04 kg (150 lb).Body mass index is 29.3 kg/(m^2).  General Appearance: Casual  Eye Contact::  Fair  Speech:  Pressured  Volume:  Decreased  Mood:  Anxious,  Affect:  Congruent  Thought Process:  Disorganized and Irrelevant  Orientation:  Full (Time, Place, and Person)  Thought Content:  Delusions and Paranoid Ideation  Suicidal Thoughts:  No  Homicidal Thoughts:  No  Memory:  Immediate;   Fair Recent;   Fair Remote;   Poor  Judgement:  Impaired  Insight:  Lacking  Psychomotor Activity:  Restlessness,   Concentration:  Fair  Recall:  [place]: Fair  Akathisia:  No    AIMS (if indicated):     Assets:  Communication Skills  Sleep:  Number of Hours: 1.75   Musculoskeletal: Strength & Muscle Tone: within normal limits Gait & Station: normal Patient leans: N/A  Current Medications: Current Facility-Administered Medications  Medication Dose Route Frequency Provider Last Rate Last Dose  . acetaminophen (TYLENOL) tablet 650 mg  650 mg Oral Q6H PRN Ursula Alert, MD      .  alum & mag hydroxide-simeth (MAALOX/MYLANTA) 200-200-20 MG/5ML suspension 30 mL  30 mL Oral Q4H PRN Ursula Alert, MD      . benztropine (COGENTIN) tablet 0.5 mg  0.5 mg Oral QHS Vallen Calabrese, MD   0.5 mg at 05/08/14 2154  . diphenhydrAMINE  (BENADRYL) capsule 25 mg  25 mg Oral Q8H PRN Ursula Alert, MD   25 mg at 05/09/14 0809  . docusate sodium (COLACE) capsule 100 mg  100 mg Oral BID Encarnacion Slates, NP   100 mg at 05/09/14 0801  . gabapentin (NEURONTIN) capsule 300 mg  300 mg Oral TID Ursula Alert, MD   300 mg at 05/09/14 0801  . hydrOXYzine (ATARAX/VISTARIL) tablet 25 mg  25 mg Oral Q6H PRN Benjamine Mola, FNP   25 mg at 05/09/14 0801  . lithium carbonate capsule 300 mg  300 mg Oral BID WC Ursula Alert, MD   300 mg at 05/09/14 0910  . magnesium hydroxide (MILK OF MAGNESIA) suspension 30 mL  30 mL Oral Daily PRN Ursula Alert, MD   30 mL at 05/07/14 2133  . multivitamin with minerals tablet 1 tablet  1 tablet Oral Daily Ursula Alert, MD   1 tablet at 05/09/14 0801  . OLANZapine (ZYPREXA) tablet 15 mg  15 mg Oral QHS Shaylee Stanislawski, MD      . OLANZapine zydis (ZYPREXA) disintegrating tablet 5 mg  5 mg Oral BID PRN Ursula Alert, MD   5 mg at 05/09/14 0801    Lab Results:  Results for orders placed during the hospital encounter of 05/03/14 (from the past 48 hour(s))  URINALYSIS, ROUTINE W REFLEX MICROSCOPIC     Status: None   Collection Time    05/07/14  3:24 PM      Result Value Ref Range   Color, Urine YELLOW  YELLOW   APPearance CLEAR  CLEAR   Specific Gravity, Urine 1.009  1.005 - 1.030   pH 6.0  5.0 - 8.0   Glucose, UA NEGATIVE  NEGATIVE mg/dL   Hgb urine dipstick NEGATIVE  NEGATIVE   Bilirubin Urine NEGATIVE  NEGATIVE   Ketones, ur NEGATIVE  NEGATIVE mg/dL   Protein, ur NEGATIVE  NEGATIVE mg/dL   Urobilinogen, UA 0.2  0.0 - 1.0 mg/dL   Nitrite NEGATIVE  NEGATIVE   Leukocytes, UA NEGATIVE  NEGATIVE   Comment: MICROSCOPIC NOT DONE ON URINES WITH NEGATIVE PROTEIN, BLOOD, LEUKOCYTES, NITRITE, OR GLUCOSE <1000 mg/dL.     Performed at [facility]    Physical Findings: AIMS: Facial and Oral Movements Muscles of Facial Expression: None, normal Lips and Perioral Area: None, normal Jaw: None,  normal Tongue: None, normal,Extremity Movements Upper (arms, wrists, hands, fingers): None, normal Lower (legs, knees, ankles, toes): None, normal, Trunk Movements Neck, shoulders, hips: None, normal, Overall Severity Severity of abnormal movements (highest score from questions above): None, normal Incapacitation due to abnormal movements: None, normal Patient's awareness of abnormal movements (rate only patient's report): No Awareness, Dental Status Current problems with teeth and/or dentures?: No Does patient usually wear dentures?: No  CIWA:    COWS:     Treatment Plan Summary: Daily contact with patient to assess and evaluate symptoms and progress in treatment Medication management  Plan: 1. Continue crisis management and mood stabilization. 2. Continue current medication management to reduce current symptoms to base line and improve the  patient's overall level of functioning; Hydroxyzine 25 mg for anxiety,Gabapentin 300 mg three times daily.     Will increase  Olanzapine to 15 mg po qhs for mood lability .      Will add Lithium 300 mg po bid .Lithium level due on 05/13/2014. 3. Treat health problems as indicated; Colace 100 mg daily for constipation,. 4. Develop treatment plan to decrease risk of flare-up or exacerbation of symptoms after discharge and the need for  readmission. 5. Psycho-social education regarding medication adherence & self care. 6. Reviewed labs ,U/A- wnl. Continue 1:1 supervision for safety.  Will start working on disposition. Patient to be referred for home health aid care .Home health care order placed.   Patient had SLUMS examination done  (05/05/14) - Patient scored 14/30 -Patient  has Major neurocognitive disorder due to multiple etiologies.  Patient will benefit from a supervised ,structured environment.  Medical Decision Making Problem Points:  Established problem, worsening (2), New problem, with additional work-up planned (4) and Review of last  therapy session (1) Data Points:  Review of medication regiment & side effects (2) Review of new medications or change in dosage (2)  I certify that inpatient services furnished can reasonably be expected to improve the patient's condition.   Cella Cappello,  05/09/2014, 11:23 AM

## 2014-05-09 NOTE — Progress Notes (Signed)
1:1 Note-per night shift report, pt not sleeping well last night, pt urinating frequently, MD notified, pt denies any s/s of UTI other than frequency-no pain per pt, MD aware, sitter in room currently with pt, pt is up walking around room, making up bed, smiling, hyperactive, pressured speech, loose associations, states "You're gonna be all right, it's [place] day in the neighborhood, what do I do with these, put them on my head or on the bed."--pt was speaking about her pillow cases, she has taken all pillow cases off of her pillows, pt not eating very well, only about 15% at ,lunch and needed a lot of encouragement to eat from the sitter, denies SI/HI/AVH, 1:1 observation continued for safety, pt needing frequent/almost constant, redirection, MD notified about pt's decreased appetite, pt is cooperative with taking medications.

## 2014-05-09 NOTE — Progress Notes (Signed)
Adult Psychoeducational Group Note  Date:  05/09/2014 Time:  4:34 AM  Group Topic/Focus:  Wrap-Up Group:   The focus of this group is to help patients review their daily goal of treatment and discuss progress on daily workbooks.  Participation Level:  Minimal  Participation Quality:  Intrusive  Affect:  Labile  Cognitive:  Lacking  Insight: Lacking  Engagement in Group:  Limited  Modes of Intervention:  Socialization and Support  Additional Comments:  Patient attended and participated in group tonight. She reports having a good day. She shook hands with a lot of people. She tried to contact her mother and sister today without success.  She sat in her room today and tried to sleep. She did not eat dinner today but did not want to talk about it. Patient advised that she has been having difficulty with her sexuality.  Salley Scarlet [facility] 05/09/2014, 4:34 AM

## 2014-05-09 NOTE — Progress Notes (Signed)
1:1 Note-Pt in room with sitter at bedside, pt has rapid speech, loose associations, flight of ideas, paranoid and grandiose delusions, MD made aware of pt status, denies SI/HI/AVH at presently, 1:1 observation continued for safety.

## 2014-05-09 NOTE — Progress Notes (Signed)
Patient ID: [name], female   DOB: November 23, 1960, 53 y.o.   MRN: [id number] 1:1 note D: client in bed eyes closed, respirations even. A: Writer assessed for safety and s/s of distress. Staff will maintain 1:1 for safety. R: client is safe on the unit, respirations unlabored, no distress noted.

## 2014-05-09 NOTE — Progress Notes (Signed)
Patient ID: [name], female   DOB: 07/01/1961, 53 y.o.   MRN: [id number] 1:1 Note D: Client at med window obtaining night medications. A: client takes medications without incidence. Staff at side 1:1 for safety. R: Client remains safe on the unit.

## 2014-05-09 NOTE — [facility] Group Notes (Signed)
Adult Psychoeducational Group Note  Date:  05/09/2014 Time:  0900 am  Group Topic/Focus:  Goals Group:   The focus of this group is to help patients establish daily goals to achieve during treatment and discuss how the patient can incorporate goal setting into their daily lives to aide in recovery.  Participation Level:  Did Not Attend  Wynn Banker 05/09/2014, 11:11 AM

## 2014-05-09 NOTE — Progress Notes (Signed)
Patient ID: [name], female   DOB: 09/02/1960, 53 y.o.   MRN: [id number] 1:1 observation note:  Patient remains disorganized with tangental speech.  Patient is resistant to her medications. Patient had to be coaxed to take her librium, as she attempted to leave it on the counter.  She requests nurse show her the packages and the dose. She remains with sitter due to behavioral issues.  Patient can be intrusive with staff and peers.  Patient is cooperative at this time.

## 2014-05-09 NOTE — Progress Notes (Signed)
1:1 note  Pt observed walking on milieu  D: Pt denies SI/HI/AVH. Pt is pleasant and cooperative. Pt continues disorganized and tangential. Pt got to medication window, refused 1/2 of zyprexa, but came to window to take it later.   A: Pt was offered support and encouragement. Pt was given scheduled medications. Pt was encourage to attend groups.1:1 continues for safety.  Q 15 minute checks were done for safety.   R:Pt attends groups and interacts well with peers and staff. Pt is taking medication. Pt has no complaints at this time .Pt receptive to treatment and safety maintained on unit.

## 2014-05-09 NOTE — [facility] Group Notes (Signed)
[place] LCSW Group Therapy  05/09/2014 , 1:50 PM   Type of Therapy:  Group Therapy  Participation Level:  Did not attend  Summary of Progress/Problems: Today's group focused on the term Diagnosis.  Participants were asked to define the term, and then pronounce whether it is a negative, positive or neutral term.   [name] 05/09/2014 , 1:50 PM

## 2014-05-10 MED ORDER — OLANZAPINE 10 MG PO TABS
20.0000 mg | ORAL_TABLET | Freq: Every day | ORAL | Status: DC
  Administered 2014-05-10 – 2014-05-14 (×4): 20 mg via ORAL
  Filled 2014-05-10 (×7): qty 2

## 2014-05-10 NOTE — [facility] Group Notes (Signed)
[place] LCSW Group Therapy  05/10/2014 2:53 PM  Type of Therapy:  Group Therapy  Participation Level:  Active but she left after 10 minutes and did not return   Participation Quality:  Attentive  Affect:  Appropriate  Cognitive:  Alert and Improving   Insight:  Improving  Engagement in Therapy:  Improving  Modes of Intervention:  Confrontation, Discussion, Education, Exploration, Problem-solving, Rapport Building, Socialization and Support  Summary of Progress/Problems: Emotion Regulation: This group focused on both positive and negative emotion identification and allowed group members to process ways to identify feelings, regulate negative emotions, and find healthy ways to manage internal/external emotions. Group members were asked to reflect on a time when their reaction to an emotion led to a negative outcome and explored how alternative responses using emotion regulation would have benefited them. Group members were also asked to discuss a time when emotion regulation was utilized when a negative emotion was experienced. [name] was attentive and engaged for the short time that she stayed in group. [name] demonstrates improving insight and some progress in the group setting AEB her ability to identify fear as a negative emotion. She then explained how fear affects her and how it feels to her both physically and mentally. "I get physically sick when I am overcome with fear. I shake and feel scared." [name] was able to process her latest experience with fear "when my roommate left." [name] shared that she felt alone because she and her roommate were close. [name] stated that she is working on overcoming her fear of being alone by coming to group and interacting with others.   Smart, Gillian Kluever LCSWA  05/10/2014, 2:53 PM

## 2014-05-10 NOTE — Progress Notes (Signed)
D: Patient appropriate and cooperative with staff and peers. Patient has anxious affect and mood. Declined self inventory sheet today. Earlier 1:1 close observation was discontinued this morning. Patient has been actively participating in groups today and interactive with peers on the hall. In compliance with all medications at this time.  A: Support and encouragement provided to patient. Scheduled medications administered per MD orders. Maintain Q15 minute checks for safety.  R: Patient receptive. Denies SI/HI and AVH. Patient remains safe.

## 2014-05-10 NOTE — Progress Notes (Signed)
[facility] 1:1 Observation Documentation  For the first (8) hours following discontinuation of 1:1 precautions, a progress note entry by nursing staff should be documented at least every 2 hours, reflecting the patient's behavior, condition, mood, and conversation.  Use the progress notes for additional entries.   Patient's Behavior: Anxious affect and mood  Patient's Condition: Pt. was in her room briefly, using the restroom, but then came out pacing the hallways again.   Patient's Conversation: Rapid, logical, coherent speech  [name] 05/10/2014, 2:39 PM

## 2014-05-10 NOTE — Progress Notes (Signed)
Patient ID: [name], female   DOB: 16-Sep-1960, 53 y.o.   MRN: [id number] D: Client pleasant, still mildly confused, alert, oriented to self, place, cooperative. Client reports "I'm tired, sick and tired of being tired"  Denies SHI, AVH. Client reports no discharge plans "I need to talk to the doctor about it" A: Writer provided emotional support, encouraged client to report any concerns,  reviewed medications, administered as ordered. Staff will monitor q9min for safety. R: Client is safe on the unit, took medications without incidence.

## 2014-05-10 NOTE — Progress Notes (Signed)
Nursing 1:1 Note  D: Patient is in her room pacing and having a conversation with MHT/sitter. She verbalized, "I'm trying really hard today".  A: Offered support to pt. Monitor Q15 minute checks for safety.  R: Patient receptive and remains safe on the unit.

## 2014-05-10 NOTE — Progress Notes (Signed)
Nursing 1:1 note D:Pt observed sleeping in bed with eyes closed. RR even and unlabored. No distress noted. A: 1:1 observation continues for safety  R: pt remains safe  

## 2014-05-10 NOTE — Progress Notes (Signed)
Patient ID: [name], female   DOB: Jan 07, 1961, 53 y.o.   MRN: [id number] Patient ID: [name], female   DOB: December 15, 1960, 53 y.o.   MRN: [id number] [facility] MD Progress Note  05/10/2014 1:22 PM [name]  MRN:  [id number]  Subjective: [name] reports, "I am ok"  Objective: Patient seen and chart reviewed. [name] continues to be manic ,with pressured speech today. She appears to have elevated mood ,singing 'jack and jill' in the hallways. She is more redirectable today. Patient denies SI/AH/VH/HI.   Per staff patient continues to manic and disorganized.   Diagnosis:   DSM5:  Primary psychiatric diagnosis:  Schizoaffective disorder,bipolar type,multiple episodes ,currently in acute episode   Secondary psychiatric diagnosis:  Somatic symptom disorder,persistent,moderate  Major neurocognitive disorder, due to multiple etiologies ,likely secondary to her psychiatric diagnosis,medical problems ,age ,polypharmacy Borderline personality disorder   Non psychiatric diagnosis:  Lithium toxicity (recent) Hx of generalized weakness (all work up wnl)  IBS  Hx of duodenal ulcer  Hx of urinary problems   Total Time spent with patient: 30 minutes   ADL's:  Intact  Sleep: Fair  Appetite:  Fair  S Psychiatric Specialty Exam: Physical Exam  Constitutional: She is oriented to person, place, and time. She appears well-developed and well-nourished.  Eyes: Pupils are equal, round, and reactive to light.  Neck: Normal range of motion.  Cardiovascular: Normal rate.   Respiratory: Effort normal.  GI: Soft.  Musculoskeletal: Normal range of motion.  Neurological: She is alert and oriented to person, place, and time.  Skin: Skin is warm.  Psychiatric: Her speech is normal. Her affect is labile. She is slowed. Thought content is paranoid and delusional. Cognition and memory are impaired. She expresses impulsivity. She expresses no homicidal and no suicidal ideation.    Review of  Systems  Constitutional: Negative.   HENT: Negative.   Eyes: Negative.   Respiratory: Negative.   Cardiovascular: Negative.   Gastrointestinal: Negative.   Genitourinary: Negative.   Musculoskeletal: Negative.   Skin: Negative.   Psychiatric/Behavioral: Positive for depression. Negative for suicidal ideas, hallucinations and substance abuse. The patient is nervous/anxious and has insomnia.     Blood pressure 132/79, pulse 109, temperature 98.1 F (36.7 C), temperature source Oral, resp. rate 16, height 5' (1.524 m), weight 68.04 kg (150 lb).Body mass index is 29.3 kg/(m^2).  General Appearance: Casual  Eye Contact::  Fair  Speech:  Pressured  Volume:  Decreased  Mood:  Euthymic,  Affect:  Congruent  Thought Process:  Disorganized and Loose  Orientation:  Full (Time, Place, and Person)  Thought Content:  Delusions  Suicidal Thoughts:  No  Homicidal Thoughts:  No  Memory:  Immediate;   Fair Recent;   Fair Remote;   Poor  Judgement:  Impaired  Insight:  Lacking  Psychomotor Activity:  Restlessness,   Concentration:  Fair  Recall:  [place]: Fair  Akathisia:  No    AIMS (if indicated):     Assets:  Communication Skills  Sleep:  Number of Hours: 5.25   Musculoskeletal: Strength & Muscle Tone: within normal limits Gait & Station: normal Patient leans: N/A  Current Medications: Current Facility-Administered Medications  Medication Dose Route Frequency Provider Last Rate Last Dose  . acetaminophen (TYLENOL) tablet 650 mg  650 mg Oral Q6H PRN Ursula Alert, MD      . alum & mag hydroxide-simeth (MAALOX/MYLANTA) 200-200-20 MG/5ML suspension 30 mL  30 mL Oral Q4H PRN Jhalen Eley  Monic Engelmann, MD      . benztropine (COGENTIN) tablet 0.5 mg  0.5 mg Oral QHS Bernardine Langworthy, MD   0.5 mg at 05/09/14 2130  . diphenhydrAMINE (BENADRYL) capsule 25 mg  25 mg Oral Q8H PRN Ursula Alert, MD   25 mg at 05/09/14 0809  . docusate sodium (COLACE) capsule 100 mg  100  mg Oral BID Encarnacion Slates, NP   100 mg at 05/10/14 0807  . gabapentin (NEURONTIN) capsule 300 mg  300 mg Oral TID Ursula Alert, MD   300 mg at 05/10/14 1148  . hydrOXYzine (ATARAX/VISTARIL) tablet 25 mg  25 mg Oral Q6H PRN Benjamine Mola, FNP   25 mg at 05/09/14 0801  . lithium carbonate capsule 300 mg  300 mg Oral BID WC Ursula Alert, MD   300 mg at 05/10/14 0807  . magnesium hydroxide (MILK OF MAGNESIA) suspension 30 mL  30 mL Oral Daily PRN Ursula Alert, MD   30 mL at 05/07/14 2133  . multivitamin with minerals tablet 1 tablet  1 tablet Oral Daily Ursula Alert, MD   1 tablet at 05/10/14 0807  . OLANZapine (ZYPREXA) tablet 15 mg  15 mg Oral QHS Ursula Alert, MD   7.5 mg at 05/09/14 2132  . OLANZapine zydis (ZYPREXA) disintegrating tablet 5 mg  5 mg Oral BID PRN Ursula Alert, MD   5 mg at 05/09/14 0801  . zolpidem (AMBIEN) tablet 5 mg  5 mg Oral QHS PRN Ursula Alert, MD   5 mg at 05/09/14 2149    Lab Results:  No results found for this or any previous visit (from the past 48 hour(s)).  Physical Findings: AIMS: Facial and Oral Movements Muscles of Facial Expression: None, normal Lips and Perioral Area: None, normal Jaw: None, normal Tongue: None, normal,Extremity Movements Upper (arms, wrists, hands, fingers): None, normal Lower (legs, knees, ankles, toes): None, normal, Trunk Movements Neck, shoulders, hips: None, normal, Overall Severity Severity of abnormal movements (highest score from questions above): None, normal Incapacitation due to abnormal movements: None, normal Patient's awareness of abnormal movements (rate only patient's report): No Awareness, Dental Status Current problems with teeth and/or dentures?: No Does patient usually wear dentures?: No  CIWA:    COWS:     Treatment Plan Summary: Daily contact with patient to assess and evaluate symptoms and progress in treatment Medication management  Plan: 1. Continue crisis management and mood  stabilization. 2. Continue current medication management to reduce current symptoms to base line and improve the  patient's overall level of functioning; Hydroxyzine 25 mg for anxiety,Gabapentin 300 mg three times daily.     Will increase Olanzapine to 20 mg po qhs for mood lability .      Will continue Lithium 300 mg po bid .Lithium level due on 05/13/2014. 3. Treat health problems as indicated; Colace 100 mg daily for constipation,. 4. Develop treatment plan to decrease risk of flare-up or exacerbation of symptoms after discharge and the need for  readmission. 5. Psycho-social education regarding medication adherence & self care. 6. Reviewed labs ,U/A- wnl. Continue 1:1 supervision for safety.  Will start working on disposition. Patient to be referred for home health aid care .Home health care order placed.   Patient had SLUMS examination done  (05/05/14) - Patient scored 14/30 -Patient  has Major neurocognitive disorder due to multiple etiologies.  Patient will benefit from a supervised ,structured environment.  Medical Decision Making Problem Points:  Established problem, worsening (2), New problem, with additional work-up  planned (4) and Review of last therapy session (1) Data Points:  Review of medication regiment & side effects (2) Review of new medications or change in dosage (2)  I certify that inpatient services furnished can reasonably be expected to improve the patient's condition.   Jensen Kilburg,  05/10/2014, 1:22 PM

## 2014-05-10 NOTE — BHH Group Notes (Signed)
[facility] LCSW Aftercare Discharge Planning Group Note   05/10/2014 12:42 PM  Participation Quality:  Minimal  Mood/Affect:  Upbeat  Depression Rating:  denies  Anxiety Rating:  denies  Thoughts of Suicide:  No Will you contract for safety?   NA  Current AVH:  No  Plan for Discharge/Comments:  Joked with [name] about her leopard print outfit.  "It's my bipolar jams."  Got up once and apologized for being disruptive.  Came back, sat down for 3 minutes, got up again and left.  Transportation Means:   Supports:  [name]

## 2014-05-10 NOTE — Progress Notes (Signed)
1:1 close observation has been d/c'd per MD, Eappen. Patient is now in the hallway smiling and talking amongst peers.

## 2014-05-10 NOTE — Progress Notes (Signed)
[facility] 1:1 Observation Documentation  For the first (8) hours following discontinuation of 1:1 precautions, a progress note entry by nursing staff should be documented at least every 2 hours, reflecting the patient's behavior, condition, mood, and conversation.  Use the progress notes for additional entries.  Patient's Behavior: Appropriate   Patient's Condition: Anxious affect & mood  Patient's Conversation: Pt. speech logical and coherent; continues to ask writer repeatedly what medications she's taking and the time.  [name] 05/10/2014, 6:31 PM

## 2014-05-10 NOTE — Progress Notes (Signed)
[place] [facility] 1:1 Observation Documentation  For the first (8) hours following discontinuation of 1:1 precautions, a progress note entry by nursing staff should be documented at least every 2 hours, reflecting the patient's behavior, condition, mood, and conversation.  Use the progress notes for additional entries.  Patient's Behavior: Continue to have appropriate affect and anxious mood.  Patient's Condition: Patient interacting with peers outside for scheduled recreational group.  Patient's Conversation: Logical, coherent speech, but show some paranoia.  Eduard Roux E 05/10/2014, 5:15 PM

## 2014-05-10 NOTE — Clinical Social Work Note (Signed)
CSW spoke with pt's sister who agreed to go to [facility] home and get rid of all prescription medications in the home for [facility] safety. CSW also requested that pt's sister work with other family members/friends to ensure that someone is visiting [place] daily in addition to in home health. Her sister was given pt's d/c plan [facility] home health/Dr. Casimiro Needle) and told CSW that pt has been seeing Jonita Albee for therapy [phone]. Pt signed release allowing CSW to contact therapist. Message left for [place] requesting call back.  [place], LCSWA 05/10/2014 11:10 AM

## 2014-05-10 NOTE — Progress Notes (Signed)
[facility] 1:1 Observation Documentation  For the first (8) hours following discontinuation of 1:1 precautions, a progress note entry by nursing staff should be documented at least every 2 hours, reflecting the patient's behavior, condition, mood, and conversation.  Use the progress notes for additional entries.  Time 1:1 discontinued:  10:22 am  Patient's Behavior: Appropriate  Patient's Condition: Patient currently walking up and down the hall with a fixed smile.  Patient's Conversation: Speech is rapid, but logical and coherent at this time.  [name] 05/10/2014, 12:48 PM

## 2014-05-11 LAB — VITAMIN D 1,25 DIHYDROXY
Vitamin D 1, 25 (OH)2 Total: 54 pg/mL (ref 18–72)
Vitamin D2 1, 25 (OH)2: <8 pg/mL
Vitamin D3 1, 25 (OH)2: 54 pg/mL

## 2014-05-11 MED ORDER — ZOLPIDEM TARTRATE 10 MG PO TABS
10.0000 mg | ORAL_TABLET | Freq: Every evening | ORAL | Status: DC | PRN
  Administered 2014-05-12 – 2014-05-16 (×5): 10 mg via ORAL
  Filled 2014-05-11 (×6): qty 1

## 2014-05-11 NOTE — BHH Group Notes (Signed)
[facility] Group Notes:  (Nursing/MHT/Case Management/Adjunct)  Type of Therapy:  Nurse Education  Participation Level:  Minimal  Participation Quality:  Limited  Affect:  Blunted  Cognitive:  Alert  Insight:  Limited  Engagement in Group:  Distracting  Modes of Intervention:  Discussion  Summary of Progress/Problems:  The purpose of this group is to speak about leisure and lifestyle change. Writer speaks about how nutrition, activity, and self care. Patient was distracting during group, getting up and leaving to use the bathroom several times.    Gaylan Gerold E 05/11/2014, 7:46 PM

## 2014-05-11 NOTE — Progress Notes (Signed)
Patient ID: [name], female   DOB: 05-28-1961, 53 y.o.   MRN: [id number] Patient ID: [name], female   DOB: 1960-09-29, 53 y.o.   MRN: [id number] [facility] MD Progress Note  05/11/2014 12:56 PM T[place] HANLON  MRN:  [id number]  Subjective: [name] reports, "I am hanging in there"  Objective: Patient seen and chart reviewed. [name] today appears to be improving ,her speech is not that pressured as yesterday and she appears to be more organized. Patient denies SI/AH/VH/HI.   Per staff patient is more calm and is compliant on medications. However she does have intrusive behavior and hence continues to create problems for her room mates.Patient continues to have sleep issues and paces at night causing disruption.  Diagnosis:   DSM5:  Primary psychiatric diagnosis:  Schizoaffective disorder,bipolar type,multiple episodes ,currently in acute episode   Secondary psychiatric diagnosis:  Somatic symptom disorder,persistent,moderate  Major neurocognitive disorder, due to multiple etiologies ,likely secondary to her psychiatric diagnosis,medical problems ,age ,polypharmacy Borderline personality disorder   Non psychiatric diagnosis:  Lithium toxicity (recent) Hx of generalized weakness (all work up wnl)  IBS  Hx of duodenal ulcer  Hx of urinary problems   Total Time spent with patient: 30 minutes   ADL's:  Intact  Sleep: Fair  Appetite:  Fair  S Psychiatric Specialty Exam: Physical Exam  Constitutional: She is oriented to person, place, and time. She appears well-developed and well-nourished.  Eyes: Pupils are equal, round, and reactive to light.  Neck: Normal range of motion.  Cardiovascular: Normal rate.   Respiratory: Effort normal.  GI: Soft.  Musculoskeletal: Normal range of motion.  Neurological: She is alert and oriented to person, place, and time.  Skin: Skin is warm.  Psychiatric: Her speech is normal. Her affect is labile. She is slowed. Thought content is  paranoid and delusional. Cognition and memory are impaired. She expresses impulsivity. She expresses no homicidal and no suicidal ideation.    Review of Systems  Constitutional: Negative.   HENT: Negative.   Eyes: Negative.   Respiratory: Negative.   Cardiovascular: Negative.   Gastrointestinal: Negative.   Genitourinary: Negative.   Musculoskeletal: Negative.   Skin: Negative.   Psychiatric/Behavioral: Positive for depression. Negative for suicidal ideas, hallucinations and substance abuse. The patient is nervous/anxious and has insomnia.     Blood pressure 133/76, pulse 100, temperature 97.6 F (36.4 C), temperature source Oral, resp. rate 20, height 5' (1.524 m), weight 68.04 kg (150 lb).Body mass index is 29.3 kg/(m^2).  General Appearance: Casual  Eye Contact::  Fair  Speech:  Pressured  Volume:  Decreased  Mood:  Euthymic,  Affect:  Congruent  Thought Process:  Disorganized and Loose, improving  Orientation:  Full (Time, Place, and Person)  Thought Content:  Delusions  Suicidal Thoughts:  No  Homicidal Thoughts:  No  Memory:  Immediate;   Fair Recent;   Fair Remote;   Poor  Judgement:  Impaired  Insight:  Lacking  Psychomotor Activity:  Restlessness,   Concentration:  Fair  Recall:  [place]: Fair  Akathisia:  No    AIMS (if indicated):     Assets:  Communication Skills  Sleep:  Number of Hours: 2   Musculoskeletal: Strength & Muscle Tone: within normal limits Gait & Station: normal Patient leans: N/A  Current Medications: Current Facility-Administered Medications  Medication Dose Route Frequency Provider Last Rate Last Dose  . acetaminophen (TYLENOL) tablet 650 mg  650 mg Oral Q6H PRN  Ursula Alert, MD      . alum & mag hydroxide-simeth (MAALOX/MYLANTA) 200-200-20 MG/5ML suspension 30 mL  30 mL Oral Q4H PRN Ursula Alert, MD      . benztropine (COGENTIN) tablet 0.5 mg  0.5 mg Oral QHS Deontaye Civello, MD   0.5 mg at 05/10/14  2129  . diphenhydrAMINE (BENADRYL) capsule 25 mg  25 mg Oral Q8H PRN Ursula Alert, MD   25 mg at 05/09/14 0809  . docusate sodium (COLACE) capsule 100 mg  100 mg Oral BID Encarnacion Slates, NP   100 mg at 05/11/14 0804  . gabapentin (NEURONTIN) capsule 300 mg  300 mg Oral TID Ursula Alert, MD   300 mg at 05/11/14 1139  . hydrOXYzine (ATARAX/VISTARIL) tablet 25 mg  25 mg Oral Q6H PRN Benjamine Mola, FNP   25 mg at 05/10/14 1601  . lithium carbonate capsule 300 mg  300 mg Oral BID WC Ursula Alert, MD   300 mg at 05/11/14 0804  . magnesium hydroxide (MILK OF MAGNESIA) suspension 30 mL  30 mL Oral Daily PRN Ursula Alert, MD   30 mL at 05/07/14 2133  . multivitamin with minerals tablet 1 tablet  1 tablet Oral Daily Ursula Alert, MD   1 tablet at 05/11/14 0804  . OLANZapine (ZYPREXA) tablet 20 mg  20 mg Oral QHS Ursula Alert, MD   20 mg at 05/10/14 2129  . OLANZapine zydis (ZYPREXA) disintegrating tablet 5 mg  5 mg Oral BID PRN Ursula Alert, MD   5 mg at 05/09/14 0801  . zolpidem (AMBIEN) tablet 10 mg  10 mg Oral QHS PRN Ursula Alert, MD        Lab Results:  No results found for this or any previous visit (from the past 48 hour(s)).  Physical Findings: AIMS: Facial and Oral Movements Muscles of Facial Expression: None, normal Lips and Perioral Area: None, normal Jaw: None, normal Tongue: None, normal,Extremity Movements Upper (arms, wrists, hands, fingers): None, normal Lower (legs, knees, ankles, toes): None, normal, Trunk Movements Neck, shoulders, hips: None, normal, Overall Severity Severity of abnormal movements (highest score from questions above): None, normal Incapacitation due to abnormal movements: None, normal Patient's awareness of abnormal movements (rate only patient's report): No Awareness, Dental Status Current problems with teeth and/or dentures?: No Does patient usually wear dentures?: No  CIWA:    COWS:     Treatment Plan Summary: Daily contact with patient  to assess and evaluate symptoms and progress in treatment Medication management  Plan: 1. Continue crisis management and mood stabilization. 2. Continue current medication management to reduce current symptoms to base line and improve the  patient's overall level of functioning; Hydroxyzine 25 mg for anxiety,Gabapentin 300 mg three times daily.     Will continue Olanzapine to 20 mg po qhs for mood lability .      Will continue Lithium 300 mg po bid .Lithium level due on 05/13/2014.     Will increase Ambien prn to 10 mg po qhs for sleep issues. 3. Treat health problems as indicated; Colace 100 mg daily for constipation,. 4. Develop treatment plan to decrease risk of flare-up or exacerbation of symptoms after discharge and the need for  readmission. 5. Psycho-social education regarding medication adherence & self care. 6. Reviewed labs ,U/A- wnl. Continue 1:1 supervision for safety.  Will start working on disposition. Patient to be referred for home health aid care .Home health care order placed. CSW was able to contact sister ,who will remove  all her medications that she does not need from her house ,including her previous prescriptions of prozac ,effexor etc , that she accidentally  OD ed on prior to coming to hospital. Patient's family will also help and support patient along with home health nurse and social worker ,once discharged.   Patient had SLUMS examination done  (05/05/14) - Patient scored 14/30 -Patient  has Major neurocognitive disorder due to multiple etiologies.  Patient will benefit from a supervised ,structured environment.  Medical Decision Making Problem Points:  Established problem, worsening (2), New problem, with additional work-up planned (4) and Review of last therapy session (1) Data Points:  Review of medication regiment & side effects (2) Review of new medications or change in dosage (2)  I certify that inpatient services furnished can reasonably be expected to  improve the patient's condition.   Paulla Mcclaskey, MD 05/11/2014, 12:56 PM

## 2014-05-11 NOTE — Progress Notes (Signed)
D: Patient appropriate and cooperative with staff and peers. Anxious affect and mood. She reported on the self inventory sheet that her sleep, appetite and ability to concentrate are all good and energy level is normal. Patient rated depression/feelings of hopelessness "6" and anxiety "5". She's attending scheduled groups, paces up and down the hall, and visible in the milieu. Patient adheres to medication regimen.  A: Support and encouragement provided to patient. Scheduled medications administered per MD orders. Maintain Q15 minute checks for safety.  R: Patient receptive. She's not endorsing SI/HI or AVH. Patient remains safe on the unit.

## 2014-05-11 NOTE — BHH Group Notes (Signed)
[facility] LCSW Group Therapy  05/11/2014 2:45 PM  Type of Therapy:  Group Therapy  Participation Level: Invited. Did Not Attend   [name],[name] 05/11/2014, 2:45 PM

## 2014-05-11 NOTE — Progress Notes (Signed)
Patient ID: [name], female   DOB: 05-06-1961, 53 y.o.   MRN: [id number] D: client visible walking the hall, intrusive, still tangential, disorganized at times, but pleasant. A: Writer reviewed clients medications encouraged her to take lithium that she had refused on previous shift. Reports she didn't take the medicine because "I had a brain problem today" "moving backwards in my head" Staff will monitor q52min for safety. R: client is safe on the unit, took medications without incidence. Client also attended karaoke and sang a couple of songs, reports she's trying to get out of her comfort zone and do things like that.

## 2014-05-12 NOTE — BHH Group Notes (Signed)
[facility] LCSW Aftercare Discharge Planning Group Note   05/12/2014 10:56 AM  Participation Quality:  Minimal  Mood/Affect:  Appropriate  Depression Rating:    Anxiety Rating:    Thoughts of Suicide:  No Will you contract for safety?   NA  Current AVH:  Denies  Plan for Discharge/Comments:  [name] went to [employer] last night and sang "Band of Gold" by Schuyler Amor.  We joked that she was not wearing her leopard print PJ.s but a more subdued plaid.  Nothing else on her mind.  Left after 10 minutes or so.  Transportation Means:   Supports:  [name]

## 2014-05-12 NOTE — Progress Notes (Signed)
D: Patient is a lot more intrusive today. Writer observed that when administering medications to other patients, she would just cut in front of them and begin asking what will she be getting at this time or later. Patient continues to pace up and down the hall. She has made some inappropriate comments; e.g. She told a peer in the hallway that I see your dead brother in the solar source and this patient had not even spoke of a sibling. Patient had to be redirected several times.  A: Support and encouragement provided to patient. Administered scheduled medications per ordering MD. Monitor Q15 minute checks for safety.  R: Patient receptive and is not endorsing SI/HI or AVH. Patient remains safe on the unit.

## 2014-05-12 NOTE — Progress Notes (Signed)
NUTRITION ASSESSMENT  Pt identified as at risk on the Malnutrition Screen Tool  INTERVENTION: 1. Educated patient on the importance of nutrition and encouraged intake of food and beverages. 2. Discussed weight goals. 3. Supplements: MVI daily  Goal: Pt to meet >/= 90% of their estimated nutrition needs.  Monitor:  PO intake  Assessment:  Patient admitted with schizoaffective disorder.  Patient states that she is eating well now but hasn't always been eating well at home.  States that she can not always chew due to jaw problems.  Noted per e-chart that weight has been increasing.    53 y.o. female  Height: Ht Readings from Last 1 Encounters:  05/03/14 5' (1.524 m)    Weight: Wt Readings from Last 1 Encounters:  05/03/14 150 lb (68.04 kg)    Weight Hx: Wt Readings from Last 10 Encounters:  05/03/14 150 lb (68.04 kg)  04/30/14 150 lb (68.04 kg)  04/12/14 142 lb (64.411 kg)  03/23/14 149 lb (67.586 kg)  02/27/14 149 lb (67.586 kg)  02/21/14 148 lb (67.132 kg)  02/13/14 150 lb (68.04 kg)  02/07/14 148 lb 1.6 oz (67.178 kg)  01/19/14 146 lb (66.225 kg)  12/19/13 145 lb (65.772 kg)    BMI:  Body mass index is 29.3 kg/(m^2). Pt meets criteria for overweight based on current BMI.  Estimated Nutritional Needs: Kcal: 25-30 kcal/kg Protein: > 1 gram protein/kg Fluid: 1 ml/kcal  Diet Order: General Pt is also offered choice of unit snacks mid-morning and mid-afternoon.  Pt is eating as desired.   Lab results and medications reviewed.   Antonieta Iba, RD, LDN Clinical Inpatient Dietitian Pager:  [phone] Weekend and after hours pager:  [phone]

## 2014-05-12 NOTE — Progress Notes (Signed)
[facility] Notes:  (Nursing/MHT/Case Management/Adjunct)  Date:  05/12/2014  Time:  10:27 PM  Type of Therapy:  Psychoeducational Skills  Participation Level:  None  Participation Quality:  Resistant  Affect:  Resistant  Cognitive:  Lacking  Insight:  None  Engagement in Group:  None  Modes of Intervention:  Education  Summary of Progress/Problems: The patient stood up in group and kept trying to walk out of the room. The patient was observed holding onto another's peers hand and was redirected. The patient eventually left group.   Archie Balboa S 05/12/2014, 10:27 PM

## 2014-05-12 NOTE — Progress Notes (Signed)
Patient ID: [name], female   DOB: May 09, 1961, 53 y.o.   MRN: [id number] Patient ID: [name], female   DOB: 1961/07/03, 53 y.o.   MRN: [id number] [facility] MD Progress Note  05/12/2014 12:25 PM [name]  MRN:  [id number]  Subjective: [name] reports, "I am doing better"  Objective: Patient seen and chart reviewed. [name] today appears to be doing better , with improved speech and is more  organized.  Patient reports that she slept well last night, which is quiet a change from the previous few nights. Patient denies SI/AH/VH/HI.   Per staff patient is more calm and is compliant on medications. However she continues to be intrusive requiring redirection.   Diagnosis:   DSM5:  Primary psychiatric diagnosis:  Schizoaffective disorder,bipolar type,multiple episodes ,currently in acute episode   Secondary psychiatric diagnosis:  Somatic symptom disorder,persistent,moderate  Major neurocognitive disorder, due to multiple etiologies ,likely secondary to her psychiatric diagnosis,medical problems ,age ,polypharmacy Borderline personality disorder   Non psychiatric diagnosis:  Lithium toxicity (recent) Hx of generalized weakness (all work up wnl)  IBS  Hx of duodenal ulcer  Hx of urinary problems   Total Time spent with patient: 30 minutes   ADL's:  Intact  Sleep: Fair  Appetite:  Fair  S Psychiatric Specialty Exam: Physical Exam  Constitutional: She is oriented to person, place, and time. She appears well-developed and well-nourished.  Eyes: Pupils are equal, round, and reactive to light.  Neck: Normal range of motion.  Cardiovascular: Normal rate.   Respiratory: Effort normal.  GI: Soft.  Musculoskeletal: Normal range of motion.  Neurological: She is alert and oriented to person, place, and time.  Skin: Skin is warm.  Psychiatric: Her speech is normal. Her affect is labile. She is slowed. Thought content is paranoid and delusional. Cognition and memory are  impaired. She expresses impulsivity. She expresses no homicidal and no suicidal ideation.    Review of Systems  Constitutional: Negative.   HENT: Negative.   Eyes: Negative.   Respiratory: Negative.   Cardiovascular: Negative.   Gastrointestinal: Negative.   Genitourinary: Negative.   Musculoskeletal: Negative.   Skin: Negative.   Psychiatric/Behavioral: Positive for depression. Negative for suicidal ideas, hallucinations and substance abuse. The patient is nervous/anxious and has insomnia.     Blood pressure 114/73, pulse 82, temperature 98.2 F (36.8 C), temperature source Oral, resp. rate 18, height 5' (1.524 m), weight 68.04 kg (150 lb).Body mass index is 29.3 kg/(m^2).  General Appearance: Casual  Eye Contact::  Fair  Speech:  Pressured improving  Volume:  Decreased  Mood:  Euthymic,  Affect:  Congruent  Thought Process:  Disorganized and Loose, improving  Orientation:  Full (Time, Place, and Person)  Thought Content:  Delusions  Suicidal Thoughts:  No  Homicidal Thoughts:  No  Memory:  Immediate;   Fair Recent;   Fair Remote;   Poor  Judgement:  Impaired  Insight:  Lacking  Psychomotor Activity:  Restlessness,   Concentration:  Fair  Recall:  [place]: Fair  Akathisia:  No    AIMS (if indicated):     Assets:  Communication Skills  Sleep:  Number of Hours: 5   Musculoskeletal: Strength & Muscle Tone: within normal limits Gait & Station: normal Patient leans: N/A  Current Medications: Current Facility-Administered Medications  Medication Dose Route Frequency Provider Last Rate Last Dose  . acetaminophen (TYLENOL) tablet 650 mg  650 mg Oral Q6H PRN Ursula Alert, MD      .  alum & mag hydroxide-simeth (MAALOX/MYLANTA) 200-200-20 MG/5ML suspension 30 mL  30 mL Oral Q4H PRN Ursula Alert, MD      . benztropine (COGENTIN) tablet 0.5 mg  0.5 mg Oral QHS Khaliq Turay, MD   0.5 mg at 05/11/14 2151  . diphenhydrAMINE (BENADRYL)  capsule 25 mg  25 mg Oral Q8H PRN Ursula Alert, MD   25 mg at 05/09/14 0809  . docusate sodium (COLACE) capsule 100 mg  100 mg Oral BID Encarnacion Slates, NP   100 mg at 05/12/14 1610  . gabapentin (NEURONTIN) capsule 300 mg  300 mg Oral TID Ursula Alert, MD   300 mg at 05/12/14 1128  . hydrOXYzine (ATARAX/VISTARIL) tablet 25 mg  25 mg Oral Q6H PRN Benjamine Mola, FNP   25 mg at 05/12/14 0045  . lithium carbonate capsule 300 mg  300 mg Oral BID WC Ursula Alert, MD   300 mg at 05/12/14 0821  . magnesium hydroxide (MILK OF MAGNESIA) suspension 30 mL  30 mL Oral Daily PRN Ursula Alert, MD   30 mL at 05/07/14 2133  . multivitamin with minerals tablet 1 tablet  1 tablet Oral Daily Ursula Alert, MD   1 tablet at 05/12/14 0821  . OLANZapine (ZYPREXA) tablet 20 mg  20 mg Oral QHS Ursula Alert, MD   20 mg at 05/11/14 2151  . OLANZapine zydis (ZYPREXA) disintegrating tablet 5 mg  5 mg Oral BID PRN Ursula Alert, MD   5 mg at 05/09/14 0801  . zolpidem (AMBIEN) tablet 10 mg  10 mg Oral QHS PRN Ursula Alert, MD   10 mg at 05/12/14 0044    Lab Results:  No results found for this or any previous visit (from the past 48 hour(s)).  Physical Findings: AIMS: Facial and Oral Movements Muscles of Facial Expression: None, normal Lips and Perioral Area: None, normal Jaw: None, normal Tongue: None, normal,Extremity Movements Upper (arms, wrists, hands, fingers): None, normal Lower (legs, knees, ankles, toes): None, normal, Trunk Movements Neck, shoulders, hips: None, normal, Overall Severity Severity of abnormal movements (highest score from questions above): None, normal Incapacitation due to abnormal movements: None, normal Patient's awareness of abnormal movements (rate only patient's report): No Awareness, Dental Status Current problems with teeth and/or dentures?: No Does patient usually wear dentures?: No  CIWA:    COWS:     Treatment Plan Summary: Daily contact with patient to assess and  evaluate symptoms and progress in treatment Medication management  Plan: 1. Continue crisis management and mood stabilization. 2. Continue current medication management to reduce current symptoms to base line and improve the  patient's overall level of functioning; Hydroxyzine 25 mg for anxiety,Gabapentin 300 mg three times daily.     Will continue Olanzapine  20 mg po qhs for mood lability .      Will continue Lithium 300 mg po bid .Lithium level due on 05/13/2014.     Will continue  Ambien prn to 10 mg po qhs for sleep issues. 3. Treat health problems as indicated; Colace 100 mg daily for constipation,. 4. Develop treatment plan to decrease risk of flare-up or exacerbation of symptoms after discharge and the need for  readmission. 5. Psycho-social education regarding medication adherence & self care.    Will start working on disposition. Patient to be referred for home health aid care .Home health care order placed. CSW was able to contact sister ,who will remove all her medications that she does not need from her house ,including her  previous prescriptions of prozac ,effexor etc , that she accidentally  OD ed on prior to coming to hospital. Patient's family will also help and support patient along with home health nurse and social worker ,once discharged.   Patient had SLUMS examination done  (05/05/14) - Patient scored 14/30 -Patient  has Major neurocognitive disorder due to multiple etiologies.  Patient will benefit from a supervised ,structured environment- patient has Home health in place and family will assist.  Medical Decision Making Problem Points:  Established problem, stable/improving (1) and Review of last therapy session (1) Data Points:  Review of medication regiment & side effects (2) Review of new medications or change in dosage (2)  I certify that inpatient services furnished can reasonably be expected to improve the patient's condition.   Vedanth Sirico, MD 05/12/2014,  12:25 PM

## 2014-05-12 NOTE — [facility] Group Notes (Signed)
[facility] LCSW Group Therapy  05/12/2014  1:05 PM  Type of Therapy:  Group therapy  Participation Level:  Active  Participation Quality:  Attentive  Affect:  Flat  Cognitive:  Oriented  Insight:  Limited  Engagement in Therapy:  Limited  Modes of Intervention:  Discussion, Socialization  Summary of Progress/Problems:  Chaplain was here to lead a group on themes of hope and courage. Taeja did her typical interjecting and giving others encouragement and positive feedback.  No insight, but great mood and attitude.  [name] 05/12/2014 1:40 PM

## 2014-05-12 NOTE — Progress Notes (Signed)
D: Pt denies SI/HI/AVH. Pt is pleasant and cooperative. Pt is tangential, labile and intrusive. Pt appears disorganized at times making inappropriate comments about her care and other patients. Pt is very paranoid about taking her medications and staff. Pt has fixed smile and continues to pace the halls at times. Pt placed ambien in mouth and spit it on the floor, medication had to be wasted  A: Pt was offered support and encouragement. Pt was given scheduled medications. Pt was encourage to attend groups. Q 15 minute checks were done for safety.   R: Pt is taking medication. Pt receptive to treatment and safety maintained on unit.

## 2014-05-12 NOTE — Tx Team (Signed)
Interdisciplinary Treatment Plan Update (Adult)   Date: 05/12/2014   Time Reviewed: 8:52 AM  Progress in Treatment:  Attending groups: Yes  Participating in groups:  Yes  Taking medication as prescribed: Yes  Tolerating medication: Yes  Family/Significant othe contact made: None needed. Collateral info provided by pt's friend and pt's mother. Pt's sister also reached for collateral info and to review after care plan.  Patient understands diagnosis: Somewhat, AEB seeking treatment for medication stabilization. She continues to minimize mental health issues.  Discussing patient identified problems/goals with staff: Yes  Medical problems stabilized or resolved: Yes  Denies suicidal/homicidal ideation: Yes during admission/self report.  Patient has not harmed self or Others: Yes  New problem(s) identified:  Discharge Plan or Barriers: Pt is hoping to return home and follow-up with Dr. Casimiro Needle. Pt refused to sign consent allowing CSW to refer her for PASRR number. Pt accepted for in home health through [facility]. Pt also has appt with her therapist. CSW contacted family who will work to make sure all her previous meds are out of her home by the time she discharges on Monday. Family also working to make sure someone is coming daily or almost daily to visit pt in her home for the forseeable future.  Additional comments: Pt attending groups for short amount of time and is able to participate. Demonstrating improving insight.  Reason for Continuation of Hospitalization: Medication stabilization Mood stabilization Estimated length of stay: 3 days (d/c Monday  Scheduled) For review of initial/current patient goals, please see plan of care.  Attendees:  Patient:    Family:    Physician: Dr. Shea Evans MD 05/12/2014 8:52 AM   Nursing: Oletta Darter RN  05/12/2014 8:52 AM   Clinical Social Worker [place], [place]  05/12/2014 8:52 AM   Other: Bonnye Fava, [place] Intern 05/12/2014 8:52 AM    Other: Roque Lias, LCSW  05/12/2014 8:52 AM   Other:  05/12/2014 8:52 AM   Other 05/12/2014 8:52 AM   Scribe for Treatment Team:  Nira Conn Smart LCSWA  05/12/2014 8:52 AM

## 2014-05-13 LAB — LITHIUM LEVEL: LITHIUM LVL: 0.31 meq/L — AB (ref 0.80–1.40)

## 2014-05-13 MED ORDER — PANTOPRAZOLE SODIUM 20 MG PO TBEC
20.0000 mg | DELAYED_RELEASE_TABLET | Freq: Every day | ORAL | Status: DC
  Administered 2014-05-13 – 2014-05-17 (×5): 20 mg via ORAL
  Filled 2014-05-13 (×8): qty 1

## 2014-05-13 NOTE — Progress Notes (Signed)
Patient ID: [name], female   DOB: [birth year], 54 y.o.   MRN: [id number] Nursing shift note: D: this pt remains confused and is having trouble following instructions. She is redirectable, but it can be difficult at times. A: staff have been successful in redirecting this pt but often she will just walk away. When interacting with her she continues to think I am someone else. R: staff continues to reorient this pt when necessary. On her inventory sheet she wrote: slept good and did request sleep medication, appetite good, energy high with no depression and no hopelessness. No withdrawal symptoms form from drugs or etoh. She was unable to complete her inventory sheet. She denied any si/hi verbally. RN will monitor and Q 15 min ck's continue.

## 2014-05-13 NOTE — Progress Notes (Signed)
D: Pt has labile affect and mood.  Pt denies SI/HI/hallucinations.  Pt reported her goal for the day was "to strengthen relationships with family and improve my financial situation."  Pt reports her visit with family was "good."  Pt was argumentative when offered her HS medications, stating "I'm not taking the Zyprexa.  That's too much for me.  I'm only going to take half of the Ambien."   A: Pt encouraged to take medications as prescribed and informed pt that she has been taking the same doses of her HS medications.  Pt then stated "well I'm not going to take either one of them."  Pt took her Ambien PRN and bit it in half and started to pocket the other half.  When staff prompted pt to return the other half of the Ambien, pt dropped it on the ground along with her water. R: Pt is intrusive when interacting with peers and staff.  Pt is resistant to care and only went to evening group for a few minutes.  Pt verbally contracts for safety and is in no distress.  Will continue to monitor and assess for safety.

## 2014-05-13 NOTE — Progress Notes (Signed)
The focus of this group is to help patients review their daily goal of treatment and discuss progress on daily workbooks. Pt entered the evening group session ten minutes late, stayed for five minutes during which time she made side comments about her peers, and then excused herself from group.

## 2014-05-13 NOTE — Plan of Care (Signed)
Problem: Ineffective individual coping Goal: STG: Patient will remain free from self harm Outcome: Progressing Pt safe on unit

## 2014-05-13 NOTE — Progress Notes (Signed)
Patient ID: [name], female   DOB: 08/10/1961, 53 y.o.   MRN: [id number] Psychoeducational Group Note  Date:  05/13/2014 Time:1020am  Group Topic/Focus:  Identifying Needs:   The focus of this group is to help patients identify their personal needs that have been historically problematic and identify healthy behaviors to address their needs.  Participation Level:  Active  Participation Quality:  Redirectable  Affect:  Flat  Cognitive:  Disorganized  Insight:  Resistant  Engagement in Group:  Resistant  Additional Comments:  Healthy coping skills.   Pricilla Larsson 05/13/2014,11:46 AM

## 2014-05-13 NOTE — [facility] Group Notes (Signed)
[facility] Group Notes: (Clinical Social Work)   05/13/2014      Type of Therapy:  Group Therapy   Participation Level:  Did Not Attend - Refused.  She did come in for about 5 minutes at the halfway point of group, then left again, stating "thank you."   Selmer Dominion, LCSW 05/13/2014, 12:45 PM

## 2014-05-13 NOTE — Progress Notes (Signed)
Patient ID: [name], female   DOB: [birth year], 53 y.o.   MRN: [id number] [facility] MD Progress Note  05/13/2014 11:28 AM [name]  MRN:  [id number]  Subjective: [name] reports, "I am feeling a lot better but I'm having trouble urinating. I think it's because everyone is turning me on. I'm so turned on by so many attractive people around here".   Objective: Patient seen and chart reviewed. Pt Pt denies SI, HI, and AVH, contracts for safety. However, pt states she is struggling to pay attention due to "being turned on by everyone almost" during groups and other situations with staff members.  Continues to report good sleep and appetite. Pt did exhibit mild echolalia when staff were instructing another patient to stop walking into other rooms. Pt repeated what we said, then stated something about "[place]".      Diagnosis:   DSM5:  Primary psychiatric diagnosis:  Schizoaffective disorder,bipolar type,multiple episodes ,currently in acute episode   Secondary psychiatric diagnosis:  Somatic symptom disorder,persistent,moderate  Major neurocognitive disorder, due to multiple etiologies ,likely secondary to her psychiatric diagnosis,medical problems ,age ,polypharmacy Borderline personality disorder   Non psychiatric diagnosis:  Lithium toxicity (recent) Hx of generalized weakness (all work up wnl)  IBS  Hx of duodenal ulcer  Hx of urinary problems   Total Time spent with patient: 30 minutes   ADL's:  Intact  Sleep: Fair  Appetite:  Fair  S Psychiatric Specialty Exam: Physical Exam  Constitutional: She is oriented to person, place, and time. She appears well-developed and well-nourished.  Eyes: Pupils are equal, round, and reactive to light.  Neck: Normal range of motion.  Cardiovascular: Normal rate.   Respiratory: Effort normal.  GI: Soft.  Musculoskeletal: Normal range of motion.  Neurological: She is alert and oriented to person, place, and time.  Skin: Skin is  warm.  Psychiatric: Her speech is normal. Her affect is labile. She is slowed. Thought content is paranoid and delusional. Cognition and memory are impaired. She expresses impulsivity. She expresses no homicidal and no suicidal ideation.    Review of Systems  Constitutional: Negative.   HENT: Negative.   Eyes: Negative.   Respiratory: Negative.   Cardiovascular: Negative.   Gastrointestinal: Negative.   Genitourinary: Negative.   Musculoskeletal: Negative.   Skin: Negative.   Psychiatric/Behavioral: Positive for depression. Negative for suicidal ideas, hallucinations and substance abuse. The patient is nervous/anxious and has insomnia.     Blood pressure 117/77, pulse 97, temperature 97.5 F (36.4 C), temperature source Oral, resp. rate 16, height 5' (1.524 m), weight 68.04 kg (150 lb).Body mass index is 29.3 kg/(m^2).  General Appearance: Casual  Eye Contact::  Fair  Speech:  Pressured improving  Volume:  Decreased  Mood:  Euthymic,  Affect:  Congruent  Thought Process:  Disorganized and Loose, improving  Orientation:  Full (Time, Place, and Person)  Thought Content:  Delusions  Suicidal Thoughts:  No  Homicidal Thoughts:  No  Memory:  Immediate;   Fair Recent;   Fair Remote;   Poor  Judgement:  Impaired  Insight:  Lacking  Psychomotor Activity:  Restlessness,   Concentration:  Fair  Recall:  [place]: Fair  Akathisia:  No    AIMS (if indicated):     Assets:  Communication Skills  Sleep:  Number of Hours: 4   Musculoskeletal: Strength & Muscle Tone: within normal limits Gait & Station: normal Patient leans: N/A  Current Medications: Current Facility-Administered Medications  Medication Dose Route Frequency Provider Last Rate Last Dose  . acetaminophen (TYLENOL) tablet 650 mg  650 mg Oral Q6H PRN Ursula Alert, MD      . alum & mag hydroxide-simeth (MAALOX/MYLANTA) 200-200-20 MG/5ML suspension 30 mL  30 mL Oral Q4H PRN Saramma  Eappen, MD      . benztropine (COGENTIN) tablet 0.5 mg  0.5 mg Oral QHS Saramma Eappen, MD   0.5 mg at 05/12/14 2201  . diphenhydrAMINE (BENADRYL) capsule 25 mg  25 mg Oral Q8H PRN Ursula Alert, MD   25 mg at 05/09/14 0809  . docusate sodium (COLACE) capsule 100 mg  100 mg Oral BID Encarnacion Slates, NP   100 mg at 05/13/14 0827  . gabapentin (NEURONTIN) capsule 300 mg  300 mg Oral TID Ursula Alert, MD   300 mg at 05/13/14 0828  . hydrOXYzine (ATARAX/VISTARIL) tablet 25 mg  25 mg Oral Q6H PRN Benjamine Mola, FNP   25 mg at 05/12/14 0045  . lithium carbonate capsule 300 mg  300 mg Oral BID WC Ursula Alert, MD   300 mg at 05/13/14 0827  . magnesium hydroxide (MILK OF MAGNESIA) suspension 30 mL  30 mL Oral Daily PRN Ursula Alert, MD   30 mL at 05/07/14 2133  . multivitamin with minerals tablet 1 tablet  1 tablet Oral Daily Ursula Alert, MD   1 tablet at 05/13/14 0827  . OLANZapine (ZYPREXA) tablet 20 mg  20 mg Oral QHS Ursula Alert, MD   20 mg at 05/12/14 2201  . OLANZapine zydis (ZYPREXA) disintegrating tablet 5 mg  5 mg Oral BID PRN Ursula Alert, MD   5 mg at 05/09/14 0801  . zolpidem (AMBIEN) tablet 10 mg  10 mg Oral QHS PRN Ursula Alert, MD   10 mg at 05/12/14 2201    Lab Results:  Results for orders placed during the hospital encounter of 05/03/14 (from the past 48 hour(s))  LITHIUM LEVEL     Status: Abnormal   Collection Time    05/13/14  6:30 AM      Result Value Ref Range   Lithium Lvl 0.31 (*) 0.80 - 1.40 mEq/L   Comment: Performed at [facility]    Physical Findings: AIMS: Facial and Oral Movements Muscles of Facial Expression: None, normal Lips and Perioral Area: None, normal Jaw: None, normal Tongue: None, normal,Extremity Movements Upper (arms, wrists, hands, fingers): None, normal Lower (legs, knees, ankles, toes): None, normal, Trunk Movements Neck, shoulders, hips: None, normal, Overall Severity Severity of abnormal movements (highest score  from questions above): None, normal Incapacitation due to abnormal movements: None, normal Patient's awareness of abnormal movements (rate only patient's report): No Awareness, Dental Status Current problems with teeth and/or dentures?: No Does patient usually wear dentures?: No  CIWA:    COWS:     Treatment Plan Summary: Daily contact with patient to assess and evaluate symptoms and progress in treatment Medication management  Plan: 1. Continue crisis management and mood stabilization. 2. Continue current medication management to reduce current symptoms to base line and improve the  patient's overall level of functioning;      Hydroxyzine 25 mg for anxiety,Gabapentin 300 mg three times daily.     Will continue Olanzapine  20 mg po qhs for mood lability .      Will continue Lithium 300 mg po bid .Lithium level due on 05/13/2014     *Lithium labs reviewed: 0.31 (low)     Will continue  Ambien prn to 10 mg po qhs for sleep issues.     *Add protonix 20mg  daily (GERD and hx of ulcer) 3. Treat health problems as indicated; Colace 100 mg daily for constipation,. 4. Develop treatment plan to decrease risk of flare-up or exacerbation of symptoms after discharge and the need for  readmission. 5. Psycho-social education regarding medication adherence & self care.    Will start working on disposition. Patient to be referred for home health aid care .Home health care order placed. CSW was able to contact sister ,who will remove all her medications that she does not need from her house ,including her previous prescriptions of prozac ,effexor etc , that she accidentally  OD ed on prior to coming to hospital. Patient's family will also help and support patient along with home health nurse and social worker ,once discharged.   Patient had SLUMS examination done  (05/05/14) - Patient scored 14/30 -Patient  has Major neurocognitive disorder due to multiple etiologies.  Patient will benefit from a  supervised ,structured environment- patient has Home health in place and family will assist.  Medical Decision Making Problem Points:  Established problem, stable/improving (1) and Review of last therapy session (1) Data Points:  Review of medication regiment & side effects (2) Review of new medications or change in dosage (2)  I certify that inpatient services furnished can reasonably be expected to improve the patient's condition.   Benjamine Mola, FNP-BC 05/13/2014, 11:28 AM  I agree with assessment and plan Geralyn Flash A. Sabra Heck, M.D.

## 2014-05-13 NOTE — Progress Notes (Signed)
Patient ID: [name], female   DOB: May 25, 1961, 53 y.o.   MRN: [id number] Psychoeducational Group Note  Date:  05/13/2014 Time:1000am  Group Topic/Focus:  Identifying Needs:   The focus of this group is to help patients identify their personal needs that have been historically problematic and identify healthy behaviors to address their needs.  Participation Level:  Active  Participation Quality:  Redirectable  Affect:  Flat  Cognitive:  Disorganized  Insight:  Resistant  Engagement in Group:  Resistant  Additional Comments:  Inventory group   Pricilla Larsson 05/13/2014,11:44 AM

## 2014-05-14 NOTE — Progress Notes (Addendum)
D: Pt has labile affect and mood.  Pt was mildly confused, agitated, and anxious at the beginning of the shift, stating "someone needs to take me off the unit to the [employer].  Is this the [place]?"  When pt was told evening group would be on the unit in the day room as usual, pt became agitated and hit the wall.  Pt stopped when redirected by staff.  Pt requested PRN medications for agitation and anxiety.   A: Medications administered per order.  PRN medications administered for anxiety and agitation, see flowsheet. Safety maintained.  Staff redirected pt multiple times for attempting to touch peer and attempting to go into peer's room.  Encouraged and supported pt. R: Pt was difficult to redirect, but was redirectable.  Pt was compliant with medications and verbally contracted for safety.  Pt is in no distress at this time.  Will continue to monitor and assess for safety.

## 2014-05-14 NOTE — Progress Notes (Signed)
Patient ID: [name], female   DOB: 11-26-1960, 53 y.o.   MRN: [id number] [facility] MD Progress Note  05/14/2014 1:18 PM [name]  MRN:  [id number]  Subjective: [name] reports, "I am feeling pretty good. So many beautiful people here. I know I'm not supposed to go in the rooms. I feel like I'm doing better with that. I really am getting better.   Objective: Patient seen and chart reviewed. Pt Pt denies SI, HI, and AVH, contracts for safety. Pt continues to be intrusive and exhibiting echolalia but both of these are improving. Pt is becoming more aware of her intrusive behaviors and is complying more with staff commands. However, pt is very forgetful and still must be redirected. Pt will also attempt to join conversations she is not a part of.     Diagnosis:   DSM5:  Primary psychiatric diagnosis:  Schizoaffective disorder,bipolar type,multiple episodes ,currently in acute episode   Secondary psychiatric diagnosis:  Somatic symptom disorder,persistent,moderate  Major neurocognitive disorder, due to multiple etiologies ,likely secondary to her psychiatric diagnosis,medical problems ,age ,polypharmacy Borderline personality disorder   Non psychiatric diagnosis:  Lithium toxicity (recent) Hx of generalized weakness (all work up wnl)  IBS  Hx of duodenal ulcer  Hx of urinary problems   Total Time spent with patient: 30 minutes   ADL's:  Intact  Sleep: Fair  Appetite:  Fair  S Psychiatric Specialty Exam: Physical Exam  Constitutional: She is oriented to person, place, and time. She appears well-developed and well-nourished.  Eyes: Pupils are equal, round, and reactive to light.  Neck: Normal range of motion.  Cardiovascular: Normal rate.   Respiratory: Effort normal.  GI: Soft.  Musculoskeletal: Normal range of motion.  Neurological: She is alert and oriented to person, place, and time.  Skin: Skin is warm.  Psychiatric: Her speech is normal. Her affect is labile.  She is slowed. Thought content is paranoid and delusional. Cognition and memory are impaired. She expresses impulsivity. She expresses no homicidal and no suicidal ideation.    Review of Systems  Constitutional: Negative.   HENT: Negative.   Eyes: Negative.   Respiratory: Negative.   Cardiovascular: Negative.   Gastrointestinal: Negative.   Genitourinary: Negative.   Musculoskeletal: Negative.   Skin: Negative.   Psychiatric/Behavioral: Positive for depression. Negative for suicidal ideas, hallucinations and substance abuse. The patient is nervous/anxious and has insomnia.     Blood pressure 126/77, pulse 92, temperature 98.8 F (37.1 C), temperature source Oral, resp. rate 16, height 5' (1.524 m), weight 68.04 kg (150 lb).Body mass index is 29.3 kg/(m^2).  General Appearance: Casual  Eye Contact::  Fair  Speech:  Clear and Coherent   Volume:  Decreased  Mood:  Euthymic,  Affect:  Congruent  Thought Process:  Disorganized and Loose, improving  Orientation:  Full (Time, Place, and Person)  Thought Content:  WDL  Suicidal Thoughts:  No  Homicidal Thoughts:  No  Memory:  Immediate;   Fair Recent;   Fair Remote;   Poor  Judgement:  Impaired  Insight:  Lacking  Psychomotor Activity:  Restlessness,   Concentration:  Fair  Recall:  [place]: Fair  Akathisia:  No    AIMS (if indicated):     Assets:  Communication Skills  Sleep:  Number of Hours: 0.75   Musculoskeletal: Strength & Muscle Tone: within normal limits Gait & Station: normal Patient leans: N/A  Current Medications: Current Facility-Administered Medications  Medication Dose Route Frequency  Provider Last Rate Last Dose  . acetaminophen (TYLENOL) tablet 650 mg  650 mg Oral Q6H PRN Ursula Alert, MD      . alum & mag hydroxide-simeth (MAALOX/MYLANTA) 200-200-20 MG/5ML suspension 30 mL  30 mL Oral Q4H PRN Saramma Eappen, MD      . benztropine (COGENTIN) tablet 0.5 mg  0.5 mg Oral QHS  Saramma Eappen, MD   0.5 mg at 05/12/14 2201  . diphenhydrAMINE (BENADRYL) capsule 25 mg  25 mg Oral Q8H PRN Ursula Alert, MD   25 mg at 05/09/14 0809  . docusate sodium (COLACE) capsule 100 mg  100 mg Oral BID Encarnacion Slates, NP   100 mg at 05/14/14 0749  . gabapentin (NEURONTIN) capsule 300 mg  300 mg Oral TID Ursula Alert, MD   300 mg at 05/14/14 1202  . hydrOXYzine (ATARAX/VISTARIL) tablet 25 mg  25 mg Oral Q6H PRN Benjamine Mola, FNP   25 mg at 05/12/14 0045  . lithium carbonate capsule 300 mg  300 mg Oral BID WC Ursula Alert, MD   300 mg at 05/14/14 0749  . magnesium hydroxide (MILK OF MAGNESIA) suspension 30 mL  30 mL Oral Daily PRN Ursula Alert, MD   30 mL at 05/07/14 2133  . multivitamin with minerals tablet 1 tablet  1 tablet Oral Daily Ursula Alert, MD   1 tablet at 05/14/14 0749  . OLANZapine (ZYPREXA) tablet 20 mg  20 mg Oral QHS Ursula Alert, MD   20 mg at 05/12/14 2201  . OLANZapine zydis (ZYPREXA) disintegrating tablet 5 mg  5 mg Oral BID PRN Ursula Alert, MD   5 mg at 05/14/14 0032  . pantoprazole (PROTONIX) EC tablet 20 mg  20 mg Oral Daily Benjamine Mola, FNP   20 mg at 05/14/14 8101  . zolpidem (AMBIEN) tablet 10 mg  10 mg Oral QHS PRN Ursula Alert, MD   10 mg at 05/13/14 2248    Lab Results:  Results for orders placed during the hospital encounter of 05/03/14 (from the past 48 hour(s))  LITHIUM LEVEL     Status: Abnormal   Collection Time    05/13/14  6:30 AM      Result Value Ref Range   Lithium Lvl 0.31 (*) 0.80 - 1.40 mEq/L   Comment: Performed at [facility]    Physical Findings: AIMS: Facial and Oral Movements Muscles of Facial Expression: None, normal Lips and Perioral Area: None, normal Jaw: None, normal Tongue: None, normal,Extremity Movements Upper (arms, wrists, hands, fingers): None, normal Lower (legs, knees, ankles, toes): None, normal, Trunk Movements Neck, shoulders, hips: None, normal, Overall Severity Severity of  abnormal movements (highest score from questions above): None, normal Incapacitation due to abnormal movements: None, normal Patient's awareness of abnormal movements (rate only patient's report): No Awareness, Dental Status Current problems with teeth and/or dentures?: No Does patient usually wear dentures?: No  CIWA:    COWS:     Treatment Plan Summary: Daily contact with patient to assess and evaluate symptoms and progress in treatment Medication management  Plan: 1. Continue crisis management and mood stabilization. 2. Continue current medication management to reduce current symptoms to base line and improve the  patient's overall level of functioning;      Hydroxyzine 25 mg for anxiety,Gabapentin 300 mg three times daily.     Will continue Olanzapine  20 mg po qhs for mood lability .      Will continue Lithium 300 mg po bid .Lithium  level due on 05/13/2014     *Lithium labs reviewed: 0.31 (low)     Will continue  Ambien prn to 10 mg po qhs for sleep issues.     *Add protonix 20mg  daily (GERD and hx of ulcer) 3. Treat health problems as indicated; Colace 100 mg daily for constipation,. 4. Develop treatment plan to decrease risk of flare-up or exacerbation of symptoms after discharge and the need for  readmission. 5. Psycho-social education regarding medication adherence & self care.    Will start working on disposition. Patient to be referred for home health aid care .Home health care order placed. CSW was able to contact sister ,who will remove all her medications that she does not need from her house ,including her previous prescriptions of prozac ,effexor etc , that she accidentally  OD ed on prior to coming to hospital. Patient's family will also help and support patient along with home health nurse and social worker ,once discharged.   Patient had SLUMS examination done  (05/05/14) - Patient scored 14/30 -Patient  has Major neurocognitive disorder due to multiple etiologies.   Patient will benefit from a supervised ,structured environment- patient has Home health in place and family will assist.  Medical Decision Making Problem Points:  Established problem, stable/improving (1) and Review of last therapy session (1) Data Points:  Review of medication regiment & side effects (2) Review of new medications or change in dosage (2)  I certify that inpatient services furnished can reasonably be expected to improve the patient's condition.   Benjamine Mola, FNP-BC 05/14/2014, 1:18 PM  I agree with assessment and plan Geralyn Flash A. Sabra Heck, M.D.

## 2014-05-14 NOTE — Progress Notes (Addendum)
Patient ID: [name], female   DOB: July 01, 1961, 53 y.o.   MRN: [id number] D: Pt is intrusive when interacting with peers and staff. She touches other pts and  is resistant to care, but did go to her group this am. She remains confused and has very poor boundaries. A:  Pt verbally contracts for safety and is in no distress and has no voiced any complaints when interacting with RN or other staff. Her " no roommate" order was renewed due to inappropriate sexual behaviors with other patients.   R: RN continues to monitor her, behaviors and symptoms. Q 15 min ck's continue.

## 2014-05-14 NOTE — BHH Group Notes (Signed)
[place] Group Notes:  (Clinical Social Work)  05/14/2014   11:15am-12:00pm  Summary of Progress/Problems:  The main focus of today's process group was to listen to a variety of genres of music and to identify that different types of music provoke different responses.  The patient then was able to identify personally what was soothing for them, as well as energizing.  Handouts were used to record feelings evoked, as well as how patient can personally use this knowledge in sleep habits, with depression, and with other symptoms.  The patient expressed understanding of concepts, as well as knowledge of how each type of music affected him/her and how this can be used at home as a wellness/recovery tool.  She danced to many of the songs in group, was very appropriate and congenial in doing so, trying to get other patients to dance with her, but in a completely appropriate manner, accepting with ease when a patient refused.  Type of Therapy:  Music Therapy   Participation Level:  Active  Participation Quality:  Attentive and Sharing  Affect:  Blunted  Cognitive:  Oriented  Insight:  Engaged  Engagement in Therapy:  Engaged  Modes of Intervention:   Activity, Exploration  Selmer Dominion, LCSW 05/14/2014, 12:30pm

## 2014-05-14 NOTE — Plan of Care (Signed)
Problem: Ineffective individual coping Goal: STG: Patient will remain free from self harm Outcome: Progressing Pt has remained free from self harm this shift.

## 2014-05-15 MED ORDER — LITHIUM CARBONATE 150 MG PO CAPS
450.0000 mg | ORAL_CAPSULE | Freq: Every evening | ORAL | Status: DC
Start: 2014-05-15 — End: 2014-05-17
  Administered 2014-05-15: 450 mg via ORAL
  Administered 2014-05-16: 300 mg via ORAL
  Filled 2014-05-15 (×4): qty 3

## 2014-05-15 MED ORDER — OLANZAPINE 5 MG PO TABS
25.0000 mg | ORAL_TABLET | Freq: Every day | ORAL | Status: DC
  Administered 2014-05-15 – 2014-05-16 (×2): 25 mg via ORAL
  Filled 2014-05-15: qty 15
  Filled 2014-05-15 (×2): qty 5
  Filled 2014-05-15: qty 15

## 2014-05-15 MED ORDER — LITHIUM CARBONATE 300 MG PO CAPS
300.0000 mg | ORAL_CAPSULE | Freq: Every day | ORAL | Status: DC
  Administered 2014-05-16 – 2014-05-17 (×2): 300 mg via ORAL
  Filled 2014-05-15: qty 1
  Filled 2014-05-15: qty 9
  Filled 2014-05-15: qty 1
  Filled 2014-05-15: qty 9

## 2014-05-15 MED ORDER — OLANZAPINE 2.5 MG PO TABS
2.5000 mg | ORAL_TABLET | Freq: Every day | ORAL | Status: DC
  Filled 2014-05-15: qty 1

## 2014-05-15 NOTE — Progress Notes (Signed)
D: Patient in her room on approach.  Patient stated she was not going to take her medications tonight but then changed her mind.  Patient remains paranoid and tangential.  Writer observed patient in the restroom trying to urinate standing up.  Also later in the shift patient urinated in a cup and placed it outside her room in the hallway.  Patient denies SI/HI and denies AVH.  Patient has been redirectable tonight but has needed to be redirected several times. A: Staff to monitor Q 15 mins for safety.  Encouragement and support offered.  Scheduled medications administered per orders. R: Patient remains safe on the unit.  Patient attended group tonight.  Patient visible on the unit.  Patient taking administered medications.

## 2014-05-15 NOTE — BHH Group Notes (Signed)
[place] [place] LCSW Group Therapy  05/15/2014 1:15 pm  Type of Therapy: Process Group Therapy  Participation Level:  Came for 5 minutes.  Left.  Summary of Progress/Problems: Today's group addressed the issue of overcoming obstacles.  Patients were asked to identify their biggest obstacle post d/c that stands in the way of their on-going success, and then problem solve as to how to manage this.  Trish Mage 05/15/2014   4:17 PM

## 2014-05-15 NOTE — BHH Group Notes (Signed)
[facility] LCSW Aftercare Discharge Planning Group Note   05/15/2014 11:36 AM  Participation Quality:  Came to group.  Mood good.  Stayed for 5 minutes.  Left.    [name]

## 2014-05-15 NOTE — Progress Notes (Signed)
Patient ID: [name], female   DOB: 11-Oct-1960, 53 y.o.   MRN: [id number] [facility] MD Progress Note  05/15/2014 2:20 PM [name]  MRN:  [id number]  Subjective: [name] reports, "I am feeling fine ."  Objective: Patient seen and chart reviewed. Pt denies SI, HI, and AVH, contracts for safety. However, pt is tangential ,irrelevant ,seen walking on the hallway talking to self. Patient also seems to be intrusive today ,trying to walk in to other patient's rooms and requiring redirection .  Continues to report good sleep and appetite.   Per staff patient has been taking her medications. Patient has been attending group therapy periodically.      Diagnosis:   DSM5:  Primary psychiatric diagnosis:  Schizoaffective disorder,bipolar type,multiple episodes ,currently in acute episode   Secondary psychiatric diagnosis:  Somatic symptom disorder,persistent,moderate  Major neurocognitive disorder, due to multiple etiologies ,likely secondary to her psychiatric diagnosis,medical problems ,age ,polypharmacy Borderline personality disorder   Non psychiatric diagnosis:  Lithium toxicity (recent) Hx of generalized weakness (all work up wnl)  IBS  Hx of duodenal ulcer  Hx of urinary problems   Total Time spent with patient: 30 minutes   ADL's:  Intact  Sleep: Fair  Appetite:  Fair  Psychiatric Specialty Exam: Physical Exam  Constitutional: She is oriented to person, place, and time. She appears well-developed and well-nourished.  Eyes: Pupils are equal, round, and reactive to light.  Neck: Normal range of motion.  Cardiovascular: Normal rate.   Respiratory: Effort normal.  GI: Soft.  Musculoskeletal: Normal range of motion.  Neurological: She is alert and oriented to person, place, and time.  Skin: Skin is warm.  Psychiatric: Her speech is normal. Her affect is labile. She is slowed. Thought content is paranoid and delusional. Cognition and memory are impaired. She expresses  impulsivity. She expresses no homicidal and no suicidal ideation.    Review of Systems  Constitutional: Negative.   HENT: Negative.   Eyes: Negative.   Respiratory: Negative.   Cardiovascular: Negative.   Gastrointestinal: Negative.   Genitourinary: Negative.   Musculoskeletal: Negative.   Skin: Negative.   Psychiatric/Behavioral: Positive for depression. Negative for suicidal ideas, hallucinations and substance abuse. The patient is nervous/anxious and has insomnia.     Blood pressure 123/75, pulse 116, temperature 98.8 F (37.1 C), temperature source Oral, resp. rate 20, height 5' (1.524 m), weight 68.04 kg (150 lb).Body mass index is 29.3 kg/(m^2).  General Appearance: Casual  Eye Contact::  Fair  Speech:  Pressured improving  Volume:  Decreased  Mood:  Euthymic,  Affect:  Congruent  Thought Process:  Disorganized and Loose, improving  Orientation:  Full (Time, Place, and Person)  Thought Content:  Delusions and denies AH/VH ,but has been found talking to self in the hallways.  Suicidal Thoughts:  No  Homicidal Thoughts:  No  Memory:  Immediate;   Fair Recent;   Fair Remote;   Poor  Judgement:  Impaired  Insight:  Lacking  Psychomotor Activity:  Restlessness,   Concentration:  Fair  Recall:  [place]: Fair  Akathisia:  No    AIMS (if indicated):     Assets:  Communication Skills  Sleep:  Number of Hours: 0.25   Musculoskeletal: Strength & Muscle Tone: within normal limits Gait & Station: normal Patient leans: N/A  Current Medications: Current Facility-Administered Medications  Medication Dose Route Frequency Provider Last Rate Last Dose  . acetaminophen (TYLENOL) tablet 650 mg  650 mg  Oral Q6H PRN Ursula Alert, MD      . alum & mag hydroxide-simeth (MAALOX/MYLANTA) 200-200-20 MG/5ML suspension 30 mL  30 mL Oral Q4H PRN Trena Dunavan, MD      . benztropine (COGENTIN) tablet 0.5 mg  0.5 mg Oral QHS Marzetta Lanza, MD   0.5 mg at  05/14/14 1934  . diphenhydrAMINE (BENADRYL) capsule 25 mg  25 mg Oral Q8H PRN Ursula Alert, MD   25 mg at 05/09/14 0809  . docusate sodium (COLACE) capsule 100 mg  100 mg Oral BID Encarnacion Slates, NP   100 mg at 05/15/14 0759  . gabapentin (NEURONTIN) capsule 300 mg  300 mg Oral TID Ursula Alert, MD   300 mg at 05/15/14 0759  . hydrOXYzine (ATARAX/VISTARIL) tablet 25 mg  25 mg Oral Q6H PRN Benjamine Mola, FNP   25 mg at 05/14/14 1930  . lithium carbonate capsule 300 mg  300 mg Oral BID WC Ursula Alert, MD   300 mg at 05/15/14 0759  . magnesium hydroxide (MILK OF MAGNESIA) suspension 30 mL  30 mL Oral Daily PRN Ursula Alert, MD   30 mL at 05/07/14 2133  . multivitamin with minerals tablet 1 tablet  1 tablet Oral Daily Ursula Alert, MD   1 tablet at 05/15/14 0759  . OLANZapine (ZYPREXA) tablet 2.5 mg  2.5 mg Oral QHS Nazly Digilio, MD      . OLANZapine zydis (ZYPREXA) disintegrating tablet 5 mg  5 mg Oral BID PRN Ursula Alert, MD   5 mg at 05/15/14 0352  . pantoprazole (PROTONIX) EC tablet 20 mg  20 mg Oral Daily Benjamine Mola, FNP   20 mg at 05/15/14 0759  . zolpidem (AMBIEN) tablet 10 mg  10 mg Oral QHS PRN Ursula Alert, MD   10 mg at 05/13/14 2248    Lab Results:  No results found for this or any previous visit (from the past 48 hour(s)).  Physical Findings: AIMS: Facial and Oral Movements Muscles of Facial Expression: None, normal Lips and Perioral Area: None, normal Jaw: None, normal Tongue: None, normal,Extremity Movements Upper (arms, wrists, hands, fingers): None, normal Lower (legs, knees, ankles, toes): None, normal, Trunk Movements Neck, shoulders, hips: None, normal, Overall Severity Severity of abnormal movements (highest score from questions above): None, normal Incapacitation due to abnormal movements: None, normal Patient's awareness of abnormal movements (rate only patient's report): No Awareness, Dental Status Current problems with teeth and/or dentures?:  No Does patient usually wear dentures?: No  CIWA:    COWS:     Treatment Plan Summary: Daily contact with patient to assess and evaluate symptoms and progress in treatment Medication management  Plan: 1. Continue crisis management and mood stabilization. 2. Continue current medication management to reduce current symptoms to base line and improve the  patient's overall level of functioning;      Hydroxyzine 25 mg for anxiety,Gabapentin 300 mg three times daily.     Will increase Olanzapine to  25 mg po qhs for mood lability .      Will increase lithium to 300 mg po daily and 450 mg po qpm.     *Lithium labs reviewed: 0.31 (05/13/14)     Will continue  Ambien  10 mg po qhs for sleep issues.     Continue protonix 20mg  daily (GERD and hx of ulcer) 3. Treat health problems as indicated; Colace 100 mg daily for constipation,. 4. Develop treatment plan to decrease risk of flare-up or exacerbation of symptoms  after discharge and the need for  readmission. 5. Psycho-social education regarding medication adherence & self care.    Will start working on disposition. Home health care order place. Patient's family will also help and support patient along with home health nurse and social worker ,once discharged.   Patient had SLUMS examination done  (05/05/14) - Patient scored 14/30 -Patient  has Major neurocognitive disorder due to multiple etiologies.  Patient will benefit from a supervised ,structured environment- patient has Home health in place and family will assist.  Medical Decision Making Problem Points:  Established problem, stable/improving (1) and Review of last therapy session (1) Data Points:  Review of medication regiment & side effects (2) Review of new medications or change in dosage (2)  I certify that inpatient services furnished can reasonably be expected to improve the patient's condition.   Aprille Sawhney MD 05/15/2014, 2:20 PM

## 2014-05-15 NOTE — Plan of Care (Signed)
Problem: Ineffective individual coping Goal: STG: Patient will remain free from self harm Outcome: Progressing Pt has remained free from self harm this shift.

## 2014-05-15 NOTE — Progress Notes (Signed)
The focus of this group is to help patients review their daily goal of treatment and discuss progress on daily workbooks. Pt did not attend the evening group. 

## 2014-05-15 NOTE — Plan of Care (Signed)
Problem: Ineffective individual coping Goal: STG-Increase in ability to manage activities of daily living Outcome: Not Progressing Pt requires frequent redirection on the unit.

## 2014-05-15 NOTE — Clinical Social Work Note (Signed)
CSW spoke with Joeseph Amor, pt's mother. CSW explained that pt will not d/c today and that she will be called Tues AM with update on d/c. CSW encouraged pt's mother to look into assisting pt with applying for Medicaid and disability. Information regarding how to apply for guardianship provided and will be in pt chart. Pt's mother made aware. Pt's mother stated that she would tell Marybeth's sister, [name], who has also been active in pt treatment/aftercare plan. CSW asked that if [name] has any further questions, she call CSW directly. Pt's mother verbalized understanding of all information.    [place], [place] 05/15/2014 12:49 PM

## 2014-05-15 NOTE — Progress Notes (Signed)
D:Pt is confused saying that this writer knows her from church, then she is making sexual comment towards women. Pt later said that writer was her sister.  A:Offered support, redirection and 15 minute checks. R:Pt takes medication as ordered. She denies si and hi. Safety maintained on the unit.

## 2014-05-16 DIAGNOSIS — F458 Other somatoform disorders: Secondary | ICD-10-CM

## 2014-05-16 MED ORDER — ZOLPIDEM TARTRATE 10 MG PO TABS: 10.0000 mg | ORAL_TABLET | Freq: Every evening | ORAL | Status: DC | PRN

## 2014-05-16 MED ORDER — OLANZAPINE 5 MG PO TABS: 25.0000 mg | ORAL_TABLET | Freq: Every day | ORAL | Status: DC

## 2014-05-16 MED ORDER — PANTOPRAZOLE SODIUM 20 MG PO TBEC: 20.0000 mg | DELAYED_RELEASE_TABLET | Freq: Every day | ORAL | Status: DC

## 2014-05-16 MED ORDER — LITHIUM CARBONATE 300 MG PO CAPS: 300.0000 mg | ORAL_CAPSULE | Freq: Every day | ORAL | Status: DC

## 2014-05-16 MED ORDER — GABAPENTIN 300 MG PO CAPS: 300.0000 mg | ORAL_CAPSULE | Freq: Three times a day (TID) | ORAL | Status: DC

## 2014-05-16 MED ORDER — ADULT MULTIVITAMIN W/MINERALS CH: 1.0000 | ORAL_TABLET | Freq: Every day | ORAL | Status: DC

## 2014-05-16 MED ORDER — BENZTROPINE MESYLATE 0.5 MG PO TABS: 0.5000 mg | ORAL_TABLET | Freq: Every day | ORAL | Status: DC

## 2014-05-16 MED ORDER — DSS 100 MG PO CAPS: 100.0000 mg | ORAL_CAPSULE | Freq: Two times a day (BID) | ORAL | Status: DC

## 2014-05-16 MED ORDER — LITHIUM CARBONATE 150 MG PO CAPS: 450.0000 mg | ORAL_CAPSULE | Freq: Every evening | ORAL | Status: DC

## 2014-05-16 NOTE — Progress Notes (Signed)
The focus of this group is to help patients review their daily goal of treatment and discuss progress on daily workbooks. Pt reported to day room for group, but started to become anxious and agitated in response to what another pt was sharing. Pt walked out of group stating "I just can't handle it."

## 2014-05-16 NOTE — BHH Group Notes (Signed)
[place] LCSW Group Therapy  05/16/2014 , 3:02 PM   Type of Therapy:  Group Therapy  Participation Level:  Active  Participation Quality:  Attentive  Affect:  Appropriate  Cognitive:  Alert  Insight: Limited  Engagement in Therapy:  Engaged  Modes of Intervention:  Discussion, Exploration and Socialization  Summary of Progress/Problems: Today's group focused on the term Diagnosis.  Participants were asked to define the term, and then pronounce whether it is a negative, positive or neutral term.  Unlike recent history, [name] stayed for most of the group today, and was engaged throughout.  Her thoughts, while not well formulated, are still on topic.  She demonstrates tangential thought, but is able to come back to the original point eventually.    [name] 05/16/2014 , 3:02 PM

## 2014-05-16 NOTE — Clinical Social Work Note (Signed)
CSW spoke w patient's sister, Dutch Quint ([phone]).  Sister expressed concern about family's lack of understanding about patient's current medical condition and discharge plan.  Wanted to speak w MD - request relayed to MD and requested call to sister.  Advised family that emergency guardianship could be pursued via clerk of court, discussed process of assisting family member to apply for Medicaid and disability.  Encouraged family to develop plan of care in coordination with outpatient therapist to best assist patient in community.  Per sister, she is health care and mental healthcare POA - asked that sister fax these documents to [facility] so these can be placed in the patient's record.  Edwyna Shell, LCSW Clinical Social Worker [phone])

## 2014-05-16 NOTE — Progress Notes (Signed)
Patient ID: [name], female   DOB: 1961/05/18, 53 y.o.   MRN: [id number] [facility] MD Progress Note  05/16/2014 12:19 PM [name]  MRN:  [id number]  Subjective: [name] reports, "I am OK ."  Objective: Patient seen and chart reviewed. Pt denies SI, HI, and AVH, contracts for safety.Patient is more logical and organized. Patient was able to list out her medications to the writer as well as knows her diagnosis as well as the side effects and consequences of taking more medications or taking an over dose of medications. Patient understands that she is going to be discharged home and has home health nurse who will assist her with her medications.  Continues to report good sleep and appetite.   Per staff patient has been taking her medications. Patient has been attending group therapy periodically. Per case worker -patient's family will be able to pick her up tomorrow. It has been communicated to the family that patient will need assistance with her disability application as well as daily check ins with her.      Diagnosis:   DSM5:  Primary psychiatric diagnosis:  Schizoaffective disorder,bipolar type,multiple episodes ,currently in acute episode   Secondary psychiatric diagnosis:  Somatic symptom disorder,persistent,moderate  Major neurocognitive disorder, due to multiple etiologies ,likely secondary to her psychiatric diagnosis,medical problems ,age ,polypharmacy Borderline personality disorder   Non psychiatric diagnosis:  Lithium toxicity (recent) Hx of generalized weakness (all work up wnl)  IBS  Hx of duodenal ulcer  Hx of urinary problems   Total Time spent with patient: 30 minutes   ADL's:  Intact  Sleep: Fair  Appetite:  Fair  Psychiatric Specialty Exam: Physical Exam  Constitutional: She is oriented to person, place, and time. She appears well-developed and well-nourished.  Eyes: Pupils are equal, round, and reactive to light.  Neck: Normal range of motion.   Cardiovascular: Normal rate.   Respiratory: Effort normal.  GI: Soft.  Musculoskeletal: Normal range of motion.  Neurological: She is alert and oriented to person, place, and time.  Skin: Skin is warm.  Psychiatric: Her speech is normal. Her affect is labile. She is slowed. Thought content is paranoid and delusional. Cognition and memory are impaired. She expresses impulsivity. She expresses no homicidal and no suicidal ideation.    Review of Systems  Constitutional: Negative.   HENT: Negative.   Eyes: Negative.   Respiratory: Negative.   Cardiovascular: Negative.   Gastrointestinal: Negative.   Genitourinary: Negative.   Musculoskeletal: Negative.   Skin: Negative.   Psychiatric/Behavioral: Positive for depression. Negative for suicidal ideas, hallucinations and substance abuse. The patient is nervous/anxious and has insomnia.     Blood pressure 123/75, pulse 116, temperature 98.8 F (37.1 C), temperature source Oral, resp. rate 20, height 5' (1.524 m), weight 68.04 kg (150 lb).Body mass index is 29.3 kg/(m^2).  General Appearance: Casual  Eye Contact::  Fair  Speech:  Pressured improving  Volume:  Decreased  Mood:  Euthymic,  Affect:  Congruent  Thought Process:  Disorganized and Loose, improving  Orientation:  Full (Time, Place, and Person)  Thought Content:  WDL  Suicidal Thoughts:  No  Homicidal Thoughts:  No  Memory:  Immediate;   Fair Recent;   Fair Remote;   Poor  Judgement:  Impaired  Insight:  Lacking  Psychomotor Activity:  Normal,   Concentration:  Fair  Recall:  [place]  Language: Fair  Akathisia:  No    AIMS (if indicated):  Assets:  Communication Skills  Sleep:  Number of Hours: 4.75   Musculoskeletal: Strength & Muscle Tone: within normal limits Gait & Station: normal Patient leans: N/A  Current Medications: Current Facility-Administered Medications  Medication Dose Route Frequency Provider Last Rate Last Dose  .  acetaminophen (TYLENOL) tablet 650 mg  650 mg Oral Q6H PRN Ursula Alert, MD      . alum & mag hydroxide-simeth (MAALOX/MYLANTA) 200-200-20 MG/5ML suspension 30 mL  30 mL Oral Q4H PRN Lisa Blakeman, MD      . benztropine (COGENTIN) tablet 0.5 mg  0.5 mg Oral QHS Elba Schaber, MD   0.5 mg at 05/15/14 2335  . diphenhydrAMINE (BENADRYL) capsule 25 mg  25 mg Oral Q8H PRN Ursula Alert, MD   25 mg at 05/09/14 0809  . docusate sodium (COLACE) capsule 100 mg  100 mg Oral BID Encarnacion Slates, NP   100 mg at 05/16/14 6283  . gabapentin (NEURONTIN) capsule 300 mg  300 mg Oral TID Ursula Alert, MD   300 mg at 05/16/14 1158  . hydrOXYzine (ATARAX/VISTARIL) tablet 25 mg  25 mg Oral Q6H PRN Benjamine Mola, FNP   25 mg at 05/16/14 0006  . lithium carbonate capsule 300 mg  300 mg Oral Q breakfast Ursula Alert, MD   300 mg at 05/16/14 0811  . lithium carbonate capsule 450 mg  450 mg Oral QPM Ursula Alert, MD   450 mg at 05/15/14 1817  . magnesium hydroxide (MILK OF MAGNESIA) suspension 30 mL  30 mL Oral Daily PRN Ursula Alert, MD   30 mL at 05/07/14 2133  . multivitamin with minerals tablet 1 tablet  1 tablet Oral Daily Ursula Alert, MD   1 tablet at 05/16/14 0811  . OLANZapine (ZYPREXA) tablet 25 mg  25 mg Oral QHS Ursula Alert, MD   25 mg at 05/15/14 2335  . OLANZapine zydis (ZYPREXA) disintegrating tablet 5 mg  5 mg Oral BID PRN Ursula Alert, MD   5 mg at 05/15/14 0352  . pantoprazole (PROTONIX) EC tablet 20 mg  20 mg Oral Daily Benjamine Mola, FNP   20 mg at 05/16/14 6629  . zolpidem (AMBIEN) tablet 10 mg  10 mg Oral QHS PRN Ursula Alert, MD   10 mg at 05/16/14 0006    Lab Results:  No results found for this or any previous visit (from the past 48 hour(s)).  Physical Findings: AIMS: Facial and Oral Movements Muscles of Facial Expression: None, normal Lips and Perioral Area: None, normal Jaw: None, normal Tongue: None, normal,Extremity Movements Upper (arms, wrists, hands, fingers):  None, normal Lower (legs, knees, ankles, toes): None, normal, Trunk Movements Neck, shoulders, hips: None, normal, Overall Severity Severity of abnormal movements (highest score from questions above): None, normal Incapacitation due to abnormal movements: None, normal Patient's awareness of abnormal movements (rate only patient's report): No Awareness, Dental Status Current problems with teeth and/or dentures?: No Does patient usually wear dentures?: No  CIWA:    COWS:     Treatment Plan Summary: Daily contact with patient to assess and evaluate symptoms and progress in treatment Medication management  Plan: 1. Continue crisis management and mood stabilization. 2. Continue current medication management to reduce current symptoms to base line and improve the  patient's overall level of functioning;      Hydroxyzine 25 mg for anxiety,Gabapentin 300 mg three times daily.     Will continue Olanzapine   25 mg po qhs for mood lability .  Will continue lithium 300 mg po daily and 450 mg po qpm. Will need another Li level in 5 days. She could do it with her outpatient provider.     *Lithium labs reviewed: 0.31 (05/13/14)     Will continue  Ambien  10 mg po qhs for sleep issues.     Continue protonix 20mg  daily (GERD and hx of ulcer) 3. Treat health problems as indicated; Colace 100 mg daily for constipation,. 4. Develop treatment plan to decrease risk of flare-up or exacerbation of symptoms after discharge and the need for  readmission. 5. Psycho-social education regarding medication adherence & self care.    Home health care order place. Patient's family will also help and support patient along with home health nurse and social worker ,once discharged.   Patient had SLUMS examination done  (05/05/14) - Patient scored 14/30 -Patient  has Major neurocognitive disorder due to multiple etiologies.  Patient will benefit from a supervised ,structured environment- patient has Home health in  place and family will assist.  Medical Decision Making Problem Points:  Established problem, stable/improving (1) and Review of last therapy session (1) Data Points:  Review of medication regiment & side effects (2) Review of new medications or change in dosage (2)  I certify that inpatient services furnished can reasonably be expected to improve the patient's condition.   Shanena Pellegrino MD 05/16/2014, 12:19 PM

## 2014-05-16 NOTE — Progress Notes (Signed)
Patient ID: [name], female   DOB: Apr 04, 1961, 53 y.o.   MRN: [id number] [facility] MD Progress Note  05/16/2014 2:30 PM [name]  MRN:  [id number]  Subjective: [name] reports, "I am feeling good".  Objective: Patient seen and chart reviewed. Pt denies SI, HI, and AVH, contracts for safety. However, pt is more organized,and logical. Patient was able to state what day it is as well as the month and year. Patient was able to list all her medications as well as state what she uses them for and what can happen ,if she overdose. Per staff patient has been taking her medications. Patient has been attending group therapy periodically.  Spoke to sister [name] today - Discussed to follow up with outpatient psychiatrist and to observe her regularly. Once patient is released ,and if patient does decompensate in the future ,then family could pursue legal guardianship.      Diagnosis:   DSM5:  Primary psychiatric diagnosis:  Schizoaffective disorder,bipolar type,multiple episodes ,currently in acute episode   Secondary psychiatric diagnosis:  Somatic symptom disorder,persistent,moderate  Major neurocognitive disorder, due to multiple etiologies ,likely secondary to her psychiatric diagnosis,medical problems ,age ,polypharmacy Borderline personality disorder   Non psychiatric diagnosis:  Lithium toxicity (recent) Hx of generalized weakness (all work up wnl)  IBS  Hx of duodenal ulcer  Hx of urinary problems   Total Time spent with patient: 30 minutes   ADL's:  Intact  Sleep: Fair  Appetite:  Fair  Psychiatric Specialty Exam: Physical Exam  Constitutional: She is oriented to person, place, and time. She appears well-developed and well-nourished.  Eyes: Pupils are equal, round, and reactive to light.  Neck: Normal range of motion.  Cardiovascular: Normal rate.   Respiratory: Effort normal.  GI: Soft.  Musculoskeletal: Normal range of motion.  Neurological: She is alert and  oriented to person, place, and time.  Skin: Skin is warm.  Psychiatric: Her speech is normal. Her affect is labile. She is slowed. Thought content is delusional (stable). Thought content is not paranoid. Cognition and memory are impaired. She expresses impulsivity. She expresses no homicidal and no suicidal ideation.    Review of Systems  Constitutional: Negative.   HENT: Negative.   Eyes: Negative.   Respiratory: Negative.   Cardiovascular: Negative.   Gastrointestinal: Negative.   Genitourinary: Negative.   Musculoskeletal: Negative.   Skin: Negative.   Psychiatric/Behavioral: Negative for suicidal ideas, hallucinations and substance abuse. The patient has insomnia (improved). The patient is not nervous/anxious.     Blood pressure 123/75, pulse 116, temperature 98.8 F (37.1 C), temperature source Oral, resp. rate 20, height 5' (1.524 m), weight 68.04 kg (150 lb).Body mass index is 29.3 kg/(m^2).  General Appearance: Casual  Eye Contact::  Fair  Speech:  Pressured improving  Volume:  Decreased  Mood:  Euthymic,  Affect:  Congruent  Thought Process:  Disorganized and Loose, improving  Orientation:  Full (Time, Place, and Person)  Thought Content:  Delusions and denies AH/VH ,but has been found talking to self in the hallways.  Suicidal Thoughts:  No  Homicidal Thoughts:  No  Memory:  Immediate;   Fair Recent;   Fair Remote;   Poor  Judgement:  Impaired  Insight:  Lacking  Psychomotor Activity:  Restlessness,   Concentration:  Fair  Recall:  [place]: Fair  Akathisia:  No    AIMS (if indicated):     Assets:  Communication Skills  Sleep:  Number of Hours:  4.75   Musculoskeletal: Strength & Muscle Tone: within normal limits Gait & Station: normal Patient leans: N/A  Current Medications: Current Facility-Administered Medications  Medication Dose Route Frequency Provider Last Rate Last Dose  . acetaminophen (TYLENOL) tablet 650 mg  650 mg  Oral Q6H PRN Ursula Alert, MD      . alum & mag hydroxide-simeth (MAALOX/MYLANTA) 200-200-20 MG/5ML suspension 30 mL  30 mL Oral Q4H PRN Daneil Beem, MD      . benztropine (COGENTIN) tablet 0.5 mg  0.5 mg Oral QHS Shatasia Cutshaw, MD   0.5 mg at 05/15/14 2335  . diphenhydrAMINE (BENADRYL) capsule 25 mg  25 mg Oral Q8H PRN Ursula Alert, MD   25 mg at 05/09/14 0809  . docusate sodium (COLACE) capsule 100 mg  100 mg Oral BID Encarnacion Slates, NP   100 mg at 05/16/14 9024  . gabapentin (NEURONTIN) capsule 300 mg  300 mg Oral TID Ursula Alert, MD   300 mg at 05/16/14 1158  . hydrOXYzine (ATARAX/VISTARIL) tablet 25 mg  25 mg Oral Q6H PRN Benjamine Mola, FNP   25 mg at 05/16/14 0006  . lithium carbonate capsule 300 mg  300 mg Oral Q breakfast Ursula Alert, MD   300 mg at 05/16/14 0811  . lithium carbonate capsule 450 mg  450 mg Oral QPM Ursula Alert, MD   450 mg at 05/15/14 1817  . magnesium hydroxide (MILK OF MAGNESIA) suspension 30 mL  30 mL Oral Daily PRN Ursula Alert, MD   30 mL at 05/07/14 2133  . multivitamin with minerals tablet 1 tablet  1 tablet Oral Daily Ursula Alert, MD   1 tablet at 05/16/14 0811  . OLANZapine (ZYPREXA) tablet 25 mg  25 mg Oral QHS Ursula Alert, MD   25 mg at 05/15/14 2335  . OLANZapine zydis (ZYPREXA) disintegrating tablet 5 mg  5 mg Oral BID PRN Ursula Alert, MD   5 mg at 05/15/14 0352  . pantoprazole (PROTONIX) EC tablet 20 mg  20 mg Oral Daily Benjamine Mola, FNP   20 mg at 05/16/14 0973  . zolpidem (AMBIEN) tablet 10 mg  10 mg Oral QHS PRN Ursula Alert, MD   10 mg at 05/16/14 0006    Lab Results:  No results found for this or any previous visit (from the past 48 hour(s)).  Physical Findings: AIMS: Facial and Oral Movements Muscles of Facial Expression: None, normal Lips and Perioral Area: None, normal Jaw: None, normal Tongue: None, normal,Extremity Movements Upper (arms, wrists, hands, fingers): None, normal Lower (legs, knees, ankles, toes):  None, normal, Trunk Movements Neck, shoulders, hips: None, normal, Overall Severity Severity of abnormal movements (highest score from questions above): None, normal Incapacitation due to abnormal movements: None, normal Patient's awareness of abnormal movements (rate only patient's report): No Awareness, Dental Status Current problems with teeth and/or dentures?: No Does patient usually wear dentures?: No  CIWA:    COWS:     Treatment Plan Summary: Daily contact with patient to assess and evaluate symptoms and progress in treatment Medication management  Plan: 1. Continue crisis management and mood stabilization. 2. Continue current medication management to reduce current symptoms to base line and improve the  patient's overall level of functioning;      Hydroxyzine 25 mg for anxiety,Gabapentin 300 mg three times daily.     Will continue Olanzapine to  25 mg po qhs for mood lability .      Will continue lithium  300 mg po  daily and 450 mg po qpm.     *Lithium labs reviewed: 0.31 (05/13/14)     Will continue  Ambien  10 mg po qhs for sleep issues.     Continue protonix 20mg  daily (GERD and hx of ulcer) 3. Treat health problems as indicated; Colace 100 mg daily for constipation,. 4. Develop treatment plan to decrease risk of flare-up or exacerbation of symptoms after discharge and the need for  readmission. 5. Psycho-social education regarding medication adherence & self care.    Patient to have home health care as well as follow up with outpatient psychiatrist on discharge.  Patient had SLUMS examination done  (05/05/14) - Patient scored 14/30 -Patient  has Major neurocognitive disorder due to multiple etiologies.  Patient will benefit from a supervised ,structured environment- patient has Home health in place and family will assist.  Medical Decision Making Problem Points:  Established problem, stable/improving (1) and Review of last therapy session (1) Data Points:  Review of  medication regiment & side effects (2)  I certify that inpatient services furnished can reasonably be expected to improve the patient's condition.   Loxley Cibrian MD 05/16/2014, 2:30 PM

## 2014-05-16 NOTE — Progress Notes (Signed)
Patient ID: [name], female   DOB: 1961-03-14, 53 y.o.   MRN: [id number] D: Patient continues to have disorganized thought processes.  She is clearer today and is less confused. Patient was eager for discharge today.  Her discharge was cancelled due to lack of transportation and family concerns.  She is pleasant today with bright affect.  She denies any SI/HI/AVH.  She is attending groups with participation.  She is less intrusive with staff and peers.  She continues to state that she is vested in her treatment and her plan is to stay compliant with her medications.  Patient can name her medications and indications for each.   A: Continue to support and encourage patient.  Safety checks completed every 15 minutes per safety.  Educated patient on the importance of taking her medications once she is discharged home.  Reiterated the importance of keeping her follow up appts.  Patient states she does have in home health care.   R: Patient needs less redirection today.  She is attending groups.  Patient's behavior has been appropriate.

## 2014-05-16 NOTE — [facility] Group Notes (Signed)
[facility] Group Notes:  (Nursing/MHT/Case Management/Adjunct)  Date:  05/16/2014  Time: 0930  Type of Therapy:  Psychoeducational Skills  Participation Level:  Active  Participation Quality:  Intrusive  Affect:  Excited  Cognitive:  Alert  Insight:  Improving  Engagement in Group:  Monopolizing  Modes of Intervention:  Support  Summary of Progress/Problems:  Zipporah Plants 05/16/2014, 3:10 PM

## 2014-05-16 NOTE — Discharge Summary (Signed)
Physician Discharge Summary Note  Patient:  [name] is an 53 y.o., female MRN:  [id number] DOB:  Jun 19, 1961 Patient phone:  [phone] (home)  Patient address:   [address] [place] [address],  Total Time spent with patient: 20 minutes  Date of Admission:  05/03/2014 Date of Discharge: 05/16/14  Reason for Admission:  Mood stabilization   Discharge Diagnoses: Active Problems:   Bipolar 1 disorder  Psychiatric Specialty Exam: Physical Exam  Psychiatric: She has a normal mood and affect. Her speech is normal and behavior is normal. Judgment and thought content normal. Cognition and memory are normal.    Review of Systems  Constitutional: Negative.   HENT: Negative.   Eyes: Negative.   Respiratory: Negative.   Cardiovascular: Negative.   Gastrointestinal: Negative.   Genitourinary: Negative.   Skin: Negative.   Neurological: Negative.   Endo/Heme/Allergies: Negative.   Psychiatric/Behavioral: Positive for memory loss (Stable ).    Blood pressure 123/75, pulse 116, temperature 98.8 F (37.1 C), temperature source Oral, resp. rate 20, height 5' (1.524 m), weight 68.04 kg (150 lb).Body mass index is 29.3 kg/(m^2).  See Physician SRA                                                  Past Psychiatric History: See H&P Diagnosis:  Hospitalizations:  Outpatient Care:  Substance Abuse Care:  Self-Mutilation:  Suicidal Attempts:  Violent Behaviors:   Musculoskeletal: Strength & Muscle Tone: within normal limits Gait & Station: normal Patient leans: N/A  DSM5:  Primary psychiatric diagnosis:  Schizoaffective disorder,bipolar type,multiple episodes ,currently in acute episode -RESOLVED  Secondary psychiatric diagnosis:  Somatic symptom disorder,persistent,moderate  Major neurocognitive disorder, due to multiple etiologies ,likely secondary to her psychiatric diagnosis,medical problems ,age ,polypharmacy  Borderline  personality disorder  Non psychiatric diagnosis:  Lithium toxicity (recent)  Hx of generalized weakness (all work up wnl)  IBS  Hx of duodenal ulcer  Hx of urinary problems   Level of Care:  OP  Hospital Course:  Rand Boller is a 53 year old CF ,who has a hx of bipolar disorder, somatic symptom disorder, is divorced ,unemployed,lives by self in a condo in [place]. Patient was recently at the [facility] last month for worsening symptoms, was stabilized and discharged. Patient after discharge went back to her condo. Patient reports that she was confused and took a lot of her medications from previous pill bottles ,like lithium 900 mg and prozac which made her even more confused. Her friend asked her to call 911 and she was admitted to the medical floor(telemetry) at [facility]. Patient was taken to [facility] on 04/30/14 for urinary incontinence as well acute psychiatric symptoms (per EHR). Apparently patient's lithium dose was increased to 900 mg bid and this made her to be confused. Li level on admission was 1.55.          [name] was admitted to the adult 400 unit where she was evaluated and her symptoms were identified. Medication management was discussed and implemented. Her Neurontin was increased to 300 mg TID for anxiety and agitation. Her Lithium dosage was decreased to 300 mg in the morning and 450 mg in the evening. Patient was started on Zyprexa due to delusions and for being observed talking to herself. The dose was gradually increased from 2.5 mg at hs  to 25 mg due to persistence of psychotic symptoms. She was encouraged to participate in unit programming. Medical problems were identified and treated appropriately. Home medication was restarted as needed. Her colace was continued at the prior to admission dose to prevent constipation. She was evaluated each day by a clinical provider to ascertain the patient's response to treatment.  Improvement was noted by the patient's report of decreasing  symptoms, improved sleep and appetite, affect, medication tolerance, behavior, and participation in unit programming.  The patient was asked each day to complete a self inventory noting mood, mental status, pain, new symptoms, anxiety and concerns.         She responded well to medication and being in a therapeutic and supportive environment. Patient needed redirection at times for episodes of confusion. A SLUMS examination done on 05/05/14 showed signs of major neurocoginitive disorder due to multiple etiologies. Positive and appropriate behavior was noted and the patient was motivated for recovery.  She worked closely with the treatment team and case manager to develop a discharge plan with appropriate goals. Coping skills, problem solving as well as relaxation therapies were also part of the unit programming. The CSW contacted her sister who removed all the medications from her house in order to prevent the patient from taking the wrong medications or overdosing after discharge. Patient's family will also help and support patient along with home health nurse and social worker ,once discharged. It was also discussed with family to pursue a neuropsychological testing as well as ALF as well as medicaid application in the future as well as possible legal guardianship. Family given information about all these. CSW will continue to work with patient and family and coordinate care with case worker.         By the day of discharge she was in much improved condition than upon admission.  Symptoms were reported as significantly decreased or resolved completely. The patient denied SI/HI and voiced no AVH. She was motivated to continue taking medication with a goal of continued improvement in mental health.   [name] was discharged home with a plan to follow up as noted below.  Consults:  psychiatry  Significant Diagnostic Studies:  Chemistry, Lithium level, UA,   Discharge Vitals:   Blood pressure 123/75,  pulse 116, temperature 98.8 F (37.1 C), temperature source Oral, resp. rate 20, height 5' (1.524 m), weight 68.04 kg (150 lb). Body mass index is 29.3 kg/(m^2). Lab Results:   No results found for this or any previous visit (from the past 72 hour(s)).  Physical Findings: AIMS: Facial and Oral Movements Muscles of Facial Expression: None, normal Lips and Perioral Area: None, normal Jaw: None, normal Tongue: None, normal,Extremity Movements Upper (arms, wrists, hands, fingers): None, normal Lower (legs, knees, ankles, toes): None, normal, Trunk Movements Neck, shoulders, hips: None, normal, Overall Severity Severity of abnormal movements (highest score from questions above): None, normal Incapacitation due to abnormal movements: None, normal Patient's awareness of abnormal movements (rate only patient's report): No Awareness, Dental Status Current problems with teeth and/or dentures?: No Does patient usually wear dentures?: No  CIWA:    COWS:     Psychiatric Specialty Exam: See Psychiatric Specialty Exam and Suicide Risk Assessment completed by Attending Physician prior to discharge.  Discharge destination:  Home  Is patient on multiple antipsychotic therapies at discharge:  No   Has Patient had three or more failed trials of antipsychotic monotherapy by history:  No  Recommended Plan for Multiple Antipsychotic Therapies:  NA  Discharge Instructions   Discharge instructions    Complete by:  As directed   Please note that your outpatient Provider will need to draw a lithium level in seven days.            Medication List       Indication   benztropine 0.5 MG tablet  Commonly known as:  COGENTIN  Take 1 tablet (0.5 mg total) by mouth at bedtime.   Indication:  Extrapyramidal Reaction caused by Medications     diphenhydrAMINE 25 MG tablet  Commonly known as:  BENADRYL  Take 25 mg by mouth every 6 (six) hours as needed for itching, allergies or sleep.      DSS 100 MG  Caps  Take 100 mg by mouth 2 (two) times daily. (This medicine may be purchased from over the counter at yr local pharmacy): For constipation   Indication:  Constipation     gabapentin 300 MG capsule  Commonly known as:  NEURONTIN  Take 1 capsule (300 mg total) by mouth 3 (three) times daily.   Indication:  Agitation     lithium carbonate 300 MG capsule  Take 1 capsule (300 mg total) by mouth daily with breakfast.   Indication:  Schizoaffective Disorder     lithium carbonate 150 MG capsule  Take 3 capsules (450 mg total) by mouth every evening.   Indication:  Schizoaffective Disorder     multivitamin with minerals Tabs tablet  Take 1 tablet by mouth daily.   Indication:  Vitamin Supplementation     OLANZapine 5 MG tablet  Commonly known as:  ZYPREXA  Take 5 tablets (25 mg total) by mouth at bedtime.   Indication:  Mood control     pantoprazole 20 MG tablet  Commonly known as:  PROTONIX  Take 1 tablet (20 mg total) by mouth daily. For acid reflux.   Indication:  Gastroesophageal Reflux Disease     zolpidem 10 MG tablet  Commonly known as:  AMBIEN  Take 1 tablet (10 mg total) by mouth at bedtime as needed for sleep.            Follow-up Information   Follow up with [facility]. (appt needed for hospital followup by d/c)    Contact information:   ATTN: Dr. Casimiro Needle [address] [place], [place] [address] Phone: [phone] Fax: [phone]       Follow up with [facility]. (call day before d/c to schedule first visit. )    Contact information:   [address] [place], [place] [address] Phone: [phone] Fax: [phone]      [facility] up with [facility] On 05/17/2014. (Appt. for therapy with Izora Gala at 3:30PM on this date. )    Contact information:   [address], [place] [address] Phone: [phone] Fax: [phone]      Follow-up recommendations:   Activity: Patient  has home health aid as well as Education officer, museum ,who will assist her ,manage her medications .Family will also assist.  Comments:   Take all your medications as prescribed by your mental healthcare provider.  Report any adverse effects and or reactions from your medicines to your outpatient provider promptly.  Patient is instructed and cautioned to not engage in alcohol and or illegal drug use while on prescription medicines.  In the event of worsening symptoms, patient is instructed to call the crisis hotline, 911 and or go to the  nearest ED for appropriate evaluation and treatment of symptoms.  Follow-up with your primary care provider for your other medical issues, concerns and or health care needs.   Total Discharge Time:  Greater than 30 minutes.  SignedElmarie Shiley NP-C 05/16/2014, 11:12 AM  Patient was seen face to face for psychiatric evaluation, suicide risk assessment and case discussed with treatment team and NP and made appropriate disposition plans. Reviewed the information documented and agree with the treatment plan.     Ursula Alert ,MD Attending [facility]

## 2014-05-16 NOTE — Clinical Social Work Note (Signed)
CSW spoke with [name], Adult Protective Services Intake for [facility] re patient discharge plan.  CSW advised that MD has stated that patient has capacity to make decision to discharge home and that patient desires to return home.  Discharge plan includes home health services including RN to assist with medications management and SW to monitor home situation and provide community support. Per APS, if patient has capacity to make her own decisions per MD, then patient may decide about discharge.  Family can be advised that they can contact [place] to inquire about emergency guardianship if they have concerns about patient's decision making capacity.  Family can also be advised that they can contact county DSS to initiate Medicaid application if needed.  CSW left message for sister, [name] at both home and cell numbers requesting call back as sister has called and expressed concern about discharge plan.    Edwyna Shell, LCSW Clinical Social Worker [phone])

## 2014-05-16 NOTE — Progress Notes (Signed)
D: Patient in her room on approach.  Patient states she had a great day.  Patient states she thought she would be able to go home today but states she is supposed to go home tomorrow.  Patient tangential but cooperative.  Patient still has some paranoia.  Patient denies SI/HI and denies AVH. A: Staff to monitor Q 15 mins for safety.  Encouragement and support offered.  Scheduled medications administered per orders.   Ambien and vistaril administered prn. R: Patient remains safe on the unit.  Patient attended group tonight but got upset and left.  Patient taking administered medications.  Patient visible on the unit ad interacting with peers.

## 2014-05-16 NOTE — BHH Suicide Risk Assessment (Addendum)
Demographic Factors:  Caucasian, Low socioeconomic status and Unemployed  Total Time spent with patient: 20 minutes  Psychiatric Specialty Exam: Physical Exam  Constitutional: She is oriented to person, place, and time. She appears well-developed and well-nourished.  HENT:  Head: Normocephalic and atraumatic.  Neck: Normal range of motion. Neck supple.  Respiratory: Effort normal.  GI: Soft.  Musculoskeletal: Normal range of motion.  Neurological: She is alert and oriented to person, place, and time.  Skin: Skin is warm.  Psychiatric: She has a normal mood and affect. Her speech is normal and behavior is normal. Thought content is not paranoid. Cognition and memory are normal. She expresses impulsivity (STABLE). She expresses no homicidal and no suicidal ideation.    Review of Systems  Constitutional: Negative.   HENT: Negative.   Eyes: Negative.   Respiratory: Negative.   Cardiovascular: Negative.   Gastrointestinal: Negative.   Genitourinary: Negative.   Musculoskeletal: Negative.   Skin: Negative.   Neurological: Negative.   Psychiatric/Behavioral: Negative for depression, suicidal ideas, hallucinations and substance abuse. The patient is not nervous/anxious.     Blood pressure 123/75, pulse 116, temperature 98.8 F (37.1 C), temperature source Oral, resp. rate 20, height 5' (1.524 m), weight 68.04 kg (150 lb).Body mass index is 29.3 kg/(m^2).  General Appearance: Casual  Eye Contact::  Fair  Speech:  Clear and Coherent  Volume:  Normal  Mood:  Euthymic  Affect:  Congruent  Thought Process:  Coherent  Orientation:  Full (Time, Place, and Person)  Thought Content:  WDL  Suicidal Thoughts:  No  Homicidal Thoughts:  No  Memory:  Immediate;   Fair Recent;   Fair Remote;   Fair  Judgement:  Fair  Insight:  Fair  Psychomotor Activity:  Normal  Concentration:  Good  Recall:  [place] of Knowledge:Good  Language: Good  Akathisia:  No    AIMS (if indicated):      Assets:  Communication Skills  Sleep:  Number of Hours: 4.75    Musculoskeletal: Strength & Muscle Tone: within normal limits Gait & Station: normal Patient leans: N/A   Mental Status Per Nursing Assessment::   On Admission:     Current Mental Status by Physician: Patient denies SI/AH/HI/VH  Loss Factors: Financial problems/change in socioeconomic status  Historical Factors: Prior suicide attempts and Impulsivity  Risk Reduction Factors:   Religious beliefs about death and Positive coping skills or problem solving skills  Continued Clinical Symptoms:  Previous Psychiatric Diagnoses and Treatments Medical Diagnoses and Treatments/Surgeries  Cognitive Features That Contribute To Risk:  Polarized thinking    Suicide Risk:  Minimal: No identifiable suicidal ideation.  Patients presenting with no risk factors but with morbid ruminations; may be classified as minimal risk based on the severity of the depressive symptoms  Discharge Diagnoses:  DSM5:  Primary psychiatric diagnosis:  Schizoaffective disorder,bipolar type,multiple episodes ,currently in acute episode -RESOLVED  Secondary psychiatric diagnosis:  Somatic symptom disorder,persistent,moderate  Major neurocognitive disorder, due to multiple etiologies ,likely secondary to her psychiatric diagnosis,medical problems ,age ,polypharmacy  Borderline personality disorder   Non psychiatric diagnosis:  Lithium toxicity (recent)  Hx of generalized weakness (all work up wnl)  IBS  Hx of duodenal ulcer  Hx of urinary problems      Past Medical History  Diagnosis Date  . Headache(784.0)   . IBS (irritable bowel syndrome)   . Bipolar 1 disorder     HX PSYCHOSIS  . Personality disorder     W/ BORDERLINE FEATURES  .  History of duodenal ulcer   . Pelvic pain   . Complication of anesthesia     POST AGGITATION  . Frequency of urination   . Urgency of urination   . Nocturia     Plan Of Care/Follow-up  recommendations:  Activity:  Patient has home health aid as well as Education officer, museum ,who will assist her ,manage her medications .Family will also assist.  Is patient on multiple antipsychotic therapies at discharge:  No   Has Patient had three or more failed trials of antipsychotic monotherapy by history:  No  Recommended Plan for Multiple Antipsychotic Therapies: NA    Farron Lafond MD 05/17/2014, 9:07 AM

## 2014-05-17 NOTE — Progress Notes (Signed)
[facility] Adult Case Management Discharge Plan :  Will you be returning to the same living situation after discharge: Yes,  home At discharge, do you have transportation home?:Yes,  pt's mother coming at 2:30PM. RN: [place] ON UNIT AND REVIEW AVS [place] TO [place].  Do you have the ability to pay for your medications:Yes,  BCBS  Release of information consent forms completed and submitted to medical records by CSW. Patient to Follow up at: Follow-up Information   Follow up with [facility] On 05/17/2014. (Call billing office at [phone] to pay fee for missed appointment. After paying this, they will schedule you for follow-up appt with Dr. Casimiro Needle. Please do this immediately upon discharge. )    Contact information:   ATTN: Dr. Casimiro Needle [address] [place], [place] [address] Phone: [phone] Fax: [phone]       Follow up with [facility]. Arville Go notified of patient discharge and will contact patient within 24 hours to schedule first appt. )    Contact information:   [address] [place], [place] [address] Phone: [phone] Fax: [phone]      Follow up with [facility] On 05/17/2014. (Appt. for therapy with Izora Gala at 3:30PM on this date. )    Contact information:   [address], [place] [address] Phone: [phone] Fax: [phone]      Patient denies SI/HI:   Yes,  during group/self report    Safety Planning and Suicide Prevention discussed:  Yes,  SPE completed with pt's mother. SPI pamphlet provided to [facility], and she was encouraged to share information with support network, ask questions, and talk about any concerns relating to SPE.  Smart, Teya Otterson LCSWA  05/17/2014, 10:16 AM

## 2014-05-17 NOTE — Progress Notes (Signed)
Patient discharged per physician order; patient denies SI/HI and A/V hallucinations; patient behavior this morning was not congruent with presentation when mother arrived; patient this morning reports " Im getting discharge today" and patient was polite and pleasant and following commands; patient was easily redirectable; during discharge process patient became irritable making statements about [place] holding her mom hostage and that she her mom was looking at her as if she did not have intelligence and this was in the presence of [place]; in the lobby the patient screamed   " you're going to let a psychotic person be discharged" ; patient reported that she knew how to get to the facility and she reported this twice in the presence of Ann C. CSW and in the presence of Tina T. RN, Christa D. RN, and Larwance Rote.  ;the  patient's mother asked could the patient stay another day and  the [facility]ra was called and Dr. Dwyane Dee and the physician was told the situation and  physician agreed with the discharge in place and  Dr. Shea Evans was called back after she was contacted and a message was left on the voicemail and  the physician felt comfortable to continue with the discharge;  patient verbalized and signed that they have received all her belongings; patient refused to sign the AVS; a  copy of the AVS was given to her mom along with detailed instructions of medication times, samples, prescriptions, and directions and also instructions with the home health nurse; patient left the unit ambulatory with the mother and was escorted with belongings by this Probation officer, Christa D. RN, Brayton Layman RN, Dorothy C. MHT

## 2014-05-17 NOTE — Clinical Social Work Note (Signed)
CSW spoke w patient's sister, Dutch Quint, provided fax number for sister to fax health care POA paperwork which can be included w patient's chart.  Sister aware that patient will be meeting w therapist today, plans to discuss plan of care and psychiatric advance directives with therapist and patient.  Wants discharge instructions to be given to both patient and mother at discharge.  CSW called M Yonjof, [place] rep.  Patient is set to receive home health from psychiatric RN and has [facility] SW referral in place also.    Edwyna Shell, LCSW Clinical Social Worker [phone])

## 2014-05-17 NOTE — BHH Group Notes (Signed)
[facility] LCSW Aftercare Discharge Planning Group Note   05/17/2014 10:12 AM  Participation Quality:  Appropriate   Mood/Affect:  Appropriate  Depression Rating:  0  Anxiety Rating:  0 "I'm just so thrilled to be going home."   Thoughts of Suicide:  No Will you contract for safety?   NA  Current AVH:  No  Plan for Discharge/Comments:  Pt reports that she feels ready to d/c home today and is aware of 3:30PM appt with Izora Gala (her therapist). We were unable to make appt with Dr. Casimiro Needle until pt pays fee for missing appt. Pt will have Carey come to see her within 24 hours of d/c. [facility].   Transportation Means: mother coming at 2:30=mom needs to come on unit to review d/c paperwork with pt and RN.   Supports: mother, sister-Drew, who is also pt's health care and mental health power of attorney.   Smart, [employer]

## 2014-05-17 NOTE — Tx Team (Signed)
Interdisciplinary Treatment Plan Update (Adult)   Date: 05/17/2014   Time Reviewed: 10:18 AM  Progress in Treatment:  Attending groups: Yes  Participating in groups:  Yes  Taking medication as prescribed: Yes  Tolerating medication: Yes  Family/Significant othe contact made: None needed. Collateral info provided by pt's friend and pt's mother. Pt's sister also reached for collateral info and to review after care plan.  Patient understands diagnosis: Somewhat, AEB seeking treatment for medication stabilization. She continues to minimize mental health issues.  Discussing patient identified problems/goals with staff: Yes  Medical problems stabilized or resolved: Yes  Denies suicidal/homicidal ideation: Yes during admission/self report.  Patient has not harmed self or Others: Yes  New problem(s) identified:  Discharge Plan or Barriers: Pt plans to return home today. Her mother is providing transportation. Pt has appt this afternoon with her therapist, Izora Gala. She must pay fee for missing appt with Dr. Casimiro Needle before his office will schedule next appt. Pt and family encouraged to take care of this immediately. [facility] made aware of pt d/c and will contact pt within 24 hours to schedule first appt (assessment).  Additional comments:  Reason for Continuation of Hospitalization: d/c today Estimated length of stay: d/c today For review of initial/current patient goals, please see plan of care.  Attendees:  Patient:    Family:    Physician: Dr. Shea Evans MD 05/17/2014 10:18 AM   Nursing: Trinna Post RN  05/17/2014 10:18 AM   Clinical Social Worker [place], [place]  05/17/2014 10:18 AM   Other: Bonnye Fava, [place] Intern 05/17/2014 10:18 AM   Other: Edwyna Shell, LCSW 05/17/2014 10:18 AM   Other: Marye Round RN 05/17/2014 10:18 AM   Other 05/17/2014 10:18 AM   Scribe for Treatment Team:  Nira Conn Smart LCSWA  05/17/2014 10:18 AM

## 2014-05-17 NOTE — Clinical Social Work Note (Signed)
CSW received copy of health care POA paperwork, gave to Medical Records and MD.  Edwyna Shell, LCSW Clinical Social Worker [phone])

## 2014-05-17 NOTE — BHH Group Notes (Signed)
[place] LCSW Group Therapy  05/17/2014 1:52 PM  Type of Therapy:  Group Therapy  Participation Level:  Minimal  Participation Quality:  Attentive  Affect:  Appropriate  Cognitive:  Lacking  Insight:  Limited  Engagement in Therapy:  Limited  Modes of Intervention:  Confrontation, Discussion, Education, Exploration, Rapport Building, Socialization and Support  Summary of Progress/Problems: [facility] Speaker came to talk about his personal journey with substance abuse and addiction. The pt processed ways by which to relate to the speaker. [place] speaker provided handouts and educational information pertaining to groups and services offered by the [facility]. Rayme wondered in and out of group. She did not come back from leaving room after about 15 minutes.    Smart, Laylee Schooley LCSWA 05/17/2014, 1:52 PM

## 2014-05-18 ENCOUNTER — Emergency Department (HOSPITAL_COMMUNITY)
Admission: EM | Admit: 2014-05-18 | Discharge: 2014-05-19 | Disposition: A | Discharge status: Home / Self Care | Payer: BC Managed Care – PPO | Attending: Emergency Medicine | Admitting: Emergency Medicine

## 2014-05-18 ENCOUNTER — Encounter (HOSPITAL_COMMUNITY): Payer: Self-pay | Admitting: Emergency Medicine

## 2014-05-18 DIAGNOSIS — Z79899 Other long term (current) drug therapy: Secondary | ICD-10-CM | POA: Insufficient documentation

## 2014-05-18 DIAGNOSIS — F22 Delusional disorders: Secondary | ICD-10-CM | POA: Diagnosis present

## 2014-05-18 DIAGNOSIS — Z87891 Personal history of nicotine dependence: Secondary | ICD-10-CM | POA: Diagnosis not present

## 2014-05-18 DIAGNOSIS — G479 Sleep disorder, unspecified: Secondary | ICD-10-CM | POA: Insufficient documentation

## 2014-05-18 DIAGNOSIS — F3131 Bipolar disorder, current episode depressed, mild: Secondary | ICD-10-CM | POA: Insufficient documentation

## 2014-05-18 DIAGNOSIS — Z8719 Personal history of other diseases of the digestive system: Secondary | ICD-10-CM | POA: Diagnosis not present

## 2014-05-18 DIAGNOSIS — F319 Bipolar disorder, unspecified: Secondary | ICD-10-CM | POA: Diagnosis present

## 2014-05-18 DIAGNOSIS — Z3202 Encounter for pregnancy test, result negative: Secondary | ICD-10-CM | POA: Diagnosis not present

## 2014-05-18 LAB — RAPID URINE DRUG SCREEN, HOSP PERFORMED
Amphetamines: NOT DETECTED
Barbiturates: NOT DETECTED
Benzodiazepines: NOT DETECTED
COCAINE: NOT DETECTED
OPIATES: NOT DETECTED
Tetrahydrocannabinol: NOT DETECTED

## 2014-05-18 LAB — CBC
HEMATOCRIT: 41.5 % (ref 36.0–46.0)
Hemoglobin: 13.4 g/dL (ref 12.0–15.0)
MCH: 28.8 pg (ref 26.0–34.0)
MCHC: 32.3 g/dL (ref 30.0–36.0)
MCV: 89.2 fL (ref 78.0–100.0)
Platelets: 368 10*3/uL (ref 150–400)
RBC: 4.65 MIL/uL (ref 3.87–5.11)
RDW: 13.2 % (ref 11.5–15.5)
WBC: 11.7 10*3/uL — ABNORMAL HIGH (ref 4.0–10.5)

## 2014-05-18 LAB — COMPREHENSIVE METABOLIC PANEL
ALBUMIN: 4.3 g/dL (ref 3.5–5.2)
ALT: 55 U/L — ABNORMAL HIGH (ref 0–35)
ANION GAP: 14 (ref 5–15)
AST: 42 U/L — ABNORMAL HIGH (ref 0–37)
Alkaline Phosphatase: 103 U/L (ref 39–117)
BUN: 15 mg/dL (ref 6–23)
CO2: 22 mEq/L (ref 19–32)
CREATININE: 0.61 mg/dL (ref 0.50–1.10)
Calcium: 10.3 mg/dL (ref 8.4–10.5)
Chloride: 103 mEq/L (ref 96–112)
GFR calc Af Amer: >90 mL/min (ref >90)
GFR calc non Af Amer: >90 mL/min (ref >90)
Glucose, Bld: 122 mg/dL — ABNORMAL HIGH (ref 70–99)
Potassium: 4.1 mEq/L (ref 3.7–5.3)
Sodium: 139 mEq/L (ref 137–147)
TOTAL PROTEIN: 8 g/dL (ref 6.0–8.3)
Total Bilirubin: 0.5 mg/dL (ref 0.3–1.2)

## 2014-05-18 LAB — SALICYLATE LEVEL: Salicylate Lvl: <2 mg/dL — ABNORMAL LOW (ref 2.8–20.0)

## 2014-05-18 LAB — ACETAMINOPHEN LEVEL: Acetaminophen (Tylenol), Serum: <15 ug/mL (ref 10–30)

## 2014-05-18 LAB — LITHIUM LEVEL: Lithium Lvl: 1.49 mEq/L — ABNORMAL HIGH (ref 0.80–1.40)

## 2014-05-18 LAB — POC URINE PREG, ED: PREG TEST UR: NEGATIVE

## 2014-05-18 LAB — ETHANOL: Alcohol, Ethyl (B): <11 mg/dL (ref 0–11)

## 2014-05-18 MED ORDER — LORAZEPAM 1 MG PO TABS
1.0000 mg | ORAL_TABLET | Freq: Four times a day (QID) | ORAL | Status: DC | PRN
  Administered 2014-05-18 (×2): 1 mg via ORAL
  Filled 2014-05-18 (×2): qty 1

## 2014-05-18 MED ORDER — ACETAMINOPHEN 325 MG PO TABS: 650.0000 mg | ORAL_TABLET | ORAL | Status: DC | PRN

## 2014-05-18 MED ORDER — PANTOPRAZOLE SODIUM 20 MG PO TBEC
20.0000 mg | DELAYED_RELEASE_TABLET | Freq: Every day | ORAL | Status: DC
  Administered 2014-05-18 – 2014-05-19 (×2): 20 mg via ORAL
  Filled 2014-05-18 (×3): qty 1

## 2014-05-18 MED ORDER — BENZTROPINE MESYLATE 1 MG PO TABS: 0.5000 mg | ORAL_TABLET | Freq: Every day | ORAL | Status: DC

## 2014-05-18 MED ORDER — ADULT MULTIVITAMIN W/MINERALS CH
1.0000 | ORAL_TABLET | Freq: Every day | ORAL | Status: DC
  Administered 2014-05-18 – 2014-05-19 (×2): 1 via ORAL
  Filled 2014-05-18 (×2): qty 1

## 2014-05-18 MED ORDER — ZOLPIDEM TARTRATE 5 MG PO TABS: 5.0000 mg | ORAL_TABLET | Freq: Every evening | ORAL | Status: DC | PRN

## 2014-05-18 MED ORDER — LITHIUM CARBONATE 300 MG PO CAPS: 300.0000 mg | ORAL_CAPSULE | Freq: Every day | ORAL | Status: DC

## 2014-05-18 MED ORDER — DOCUSATE SODIUM 100 MG PO CAPS
100.0000 mg | ORAL_CAPSULE | Freq: Two times a day (BID) | ORAL | Status: DC
  Administered 2014-05-18 – 2014-05-19 (×2): 100 mg via ORAL
  Filled 2014-05-18 (×3): qty 1

## 2014-05-18 MED ORDER — DIPHENHYDRAMINE HCL 25 MG PO TABS
25.0000 mg | ORAL_TABLET | Freq: Four times a day (QID) | ORAL | Status: DC | PRN
  Filled 2014-05-18: qty 1

## 2014-05-18 MED ORDER — OLANZAPINE 5 MG PO TABS
25.0000 mg | ORAL_TABLET | Freq: Every day | ORAL | Status: DC
  Administered 2014-05-18: 25 mg via ORAL
  Filled 2014-05-18: qty 1
  Filled 2014-05-18: qty 2

## 2014-05-18 MED ORDER — ZOLPIDEM TARTRATE 10 MG PO TABS: 10.0000 mg | ORAL_TABLET | Freq: Every evening | ORAL | Status: DC | PRN

## 2014-05-18 MED ORDER — LITHIUM CARBONATE 300 MG PO CAPS: 450.0000 mg | ORAL_CAPSULE | Freq: Every evening | ORAL | Status: DC

## 2014-05-18 MED ORDER — GABAPENTIN 300 MG PO CAPS
300.0000 mg | ORAL_CAPSULE | Freq: Three times a day (TID) | ORAL | Status: DC
  Administered 2014-05-18 – 2014-05-19 (×3): 300 mg via ORAL
  Filled 2014-05-18 (×3): qty 1

## 2014-05-18 NOTE — ED Notes (Signed)
Assumed care of patient Patient states that she needs "Help with the manic feelings that I've been having." Patient reports that she was recently DC from [facility] Patient denies SI and/or HI Patient with delusional thinking, flight of ideas, pressured speech (as documented) Patient in burgundy scrubs Patient is alert and cooperative at this time Water and crackers provided, per request Patient denies c/o pain or further needs at this time

## 2014-05-18 NOTE — ED Notes (Signed)
MD at bedside. 

## 2014-05-18 NOTE — ED Notes (Signed)
Pt on telephone.  
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
## 2014-05-18 NOTE — ED Notes (Signed)
Meal given

## 2014-05-18 NOTE — BHH Counselor (Signed)
Per [facility]it[facility], pt has been declined by Dr Shea Evans.  Arnold Long, [place] Assessment Counselor

## 2014-05-18 NOTE — ED Notes (Signed)
Pt has one bag pt belongings containing: shirt, pants, 1 pair socks, 1 pair shoes, jacket, 4 sets of keys. Pt has one gold ring R finger that could not be removed. Valuables locked with security.

## 2014-05-18 NOTE — ED Notes (Signed)
Pt defecated and urinated on floor in room after awakening from sleep.

## 2014-05-18 NOTE — ED Notes (Signed)
nourishment given to patient
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
## 2014-05-18 NOTE — ED Notes (Signed)
Pt mother at bedside

## 2014-05-19 DIAGNOSIS — F319 Bipolar disorder, unspecified: Secondary | ICD-10-CM

## 2014-05-19 MED ORDER — GABAPENTIN 100 MG PO CAPS: 200.0000 mg | ORAL_CAPSULE | Freq: Four times a day (QID) | ORAL | Status: DC

## 2014-05-19 MED ORDER — ADULT MULTIVITAMIN W/MINERALS CH: 1.0000 | ORAL_TABLET | Freq: Every day | ORAL | Status: DC

## 2014-05-19 MED ORDER — LITHIUM CARBONATE 150 MG PO CAPS: 450.0000 mg | ORAL_CAPSULE | Freq: Every evening | ORAL | Status: DC

## 2014-05-19 MED ORDER — LITHIUM CARBONATE 300 MG PO CAPS: 300.0000 mg | ORAL_CAPSULE | Freq: Every day | ORAL | Status: DC

## 2014-05-19 MED ORDER — HYDROXYZINE HCL 50 MG PO TABS: 50.0000 mg | ORAL_TABLET | Freq: Every day | ORAL | Status: DC

## 2014-05-19 MED ORDER — HYDROXYZINE HCL 25 MG PO TABS: 50.0000 mg | ORAL_TABLET | Freq: Every day | ORAL | Status: DC

## 2014-05-19 MED ORDER — BENZTROPINE MESYLATE 0.5 MG PO TABS: 0.5000 mg | ORAL_TABLET | Freq: Every day | ORAL | Status: DC
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
## 2014-05-19 NOTE — Consult Note (Signed)
[facility] Face-to-Face Psychiatry Consult   Reason for Consult:  Delusional Referring Physician:  EDP  [name] is an 53 y.o. female. Total Time spent with patient: 20 minutes  Assessment: AXIS I:  Bipolar disorder AXIS II:  Cluster B Traits AXIS III:   Past Medical History  Diagnosis Date  . Headache(784.0)   . IBS (irritable bowel syndrome)   . Bipolar 1 disorder     HX PSYCHOSIS  . Personality disorder     W/ BORDERLINE FEATURES  . History of duodenal ulcer   . Pelvic pain   . Complication of anesthesia     POST AGGITATION  . Frequency of urination   . Urgency of urination   . Nocturia    AXIS IV:  other psychosocial or environmental problems, problems related to social environment and problems with primary support group AXIS V:  61-70 mild symptoms  Plan:  No evidence of imminent risk to self or others at present.  Dr. Darleene Cleaver assessed the patient and concurs with the plan.  Subjective:   [name] is a 53 y.o. female patient does not warrant admission.  HPI:  Patient was discharge on 9/30 from [facility] and went home.  She got into an argument with her mother, "My mother is getting on my last nerve."  She states her mother is staying with her and controlling her medications, she wants to do it by herself.  [name] also wanted to know where her home health was but had not been home from the hospital long enough for her mental health home providers to come visit.   Denies suicidal/homicidal ideations, hallucinations, delusions, and alcohol/drug abuse.  Patient is stable to return home. HPI Elements:   Location:  generalized. Quality:  acute. Severity:  mild. Timing:  intermittent. Duration:  brief. Context:  stressors.  Past Psychiatric History: Past Medical History  Diagnosis Date  . Headache(784.0)   . IBS (irritable bowel syndrome)   . Bipolar 1 disorder     HX PSYCHOSIS  . Personality disorder     W/ BORDERLINE FEATURES  . History of duodenal ulcer   .  Pelvic pain   . Complication of anesthesia     POST AGGITATION  . Frequency of urination   . Urgency of urination   . Nocturia     reports that she quit smoking about 4 years ago. Her smoking use included Cigarettes. She has a .01 pack-year smoking history. She has never used smokeless tobacco. She reports that she drinks about 1.8 ounces of alcohol per week. She reports that she does not use illicit drugs. Family History  Problem Relation Age of Onset  . Sleep apnea Father   . Migraines Sister     headaches  . Other Mother     MAC infection           Allergies:   Allergies  Allergen Reactions  . Morphine And Related Nausea And Vomiting    ACT Assessment Complete:  Yes:    Educational Status    Risk to Self: Risk to self with the past 6 months Is patient at risk for suicide?: No, but patient needs Medical Clearance Substance abuse history and/or treatment for substance abuse?: No  Risk to Others:    Abuse:    Prior Inpatient Therapy:    Prior Outpatient Therapy:    Additional Information:                    Objective: Blood  pressure 119/71, pulse 93, temperature 97.7 F (36.5 C), temperature source Oral, resp. rate 15, SpO2 99.00%.There is no weight on file to calculate BMI. Results for orders placed during the hospital encounter of 05/18/14 (from the past 72 hour(s))  URINE RAPID DRUG SCREEN (HOSP PERFORMED)     Status: None   Collection Time    05/18/14 11:11 AM      Result Value Ref Range   Opiates NONE DETECTED  NONE DETECTED   Cocaine NONE DETECTED  NONE DETECTED   Benzodiazepines NONE DETECTED  NONE DETECTED   Amphetamines NONE DETECTED  NONE DETECTED   Tetrahydrocannabinol NONE DETECTED  NONE DETECTED   Barbiturates NONE DETECTED  NONE DETECTED   Comment:            DRUG SCREEN FOR MEDICAL PURPOSES     ONLY.  IF CONFIRMATION IS NEEDED     FOR ANY PURPOSE, NOTIFY LAB     WITHIN 5 DAYS.                LOWEST DETECTABLE LIMITS     FOR URINE  DRUG SCREEN     Drug Class       Cutoff (ng/mL)     Amphetamine      1000     Barbiturate      200     Benzodiazepine   644     Tricyclics       034     Opiates          300     Cocaine          300     THC              50  CBC     Status: Abnormal   Collection Time    05/18/14 11:12 AM      Result Value Ref Range   WBC 11.7 (*) 4.0 - 10.5 K/uL   RBC 4.65  3.87 - 5.11 MIL/uL   Hemoglobin 13.4  12.0 - 15.0 g/dL   HCT 41.5  36.0 - 46.0 %   MCV 89.2  78.0 - 100.0 fL   MCH 28.8  26.0 - 34.0 pg   MCHC 32.3  30.0 - 36.0 g/dL   RDW 13.2  11.5 - 15.5 %   Platelets 368  150 - 400 K/uL  COMPREHENSIVE METABOLIC PANEL     Status: Abnormal   Collection Time    05/18/14 11:12 AM      Result Value Ref Range   Sodium 139  137 - 147 mEq/L   Potassium 4.1  3.7 - 5.3 mEq/L   Chloride 103  96 - 112 mEq/L   CO2 22  19 - 32 mEq/L   Glucose, Bld 122 (*) 70 - 99 mg/dL   BUN 15  6 - 23 mg/dL   Creatinine, Ser 0.61  0.50 - 1.10 mg/dL   Calcium 10.3  8.4 - 10.5 mg/dL   Total Protein 8.0  6.0 - 8.3 g/dL   Albumin 4.3  3.5 - 5.2 g/dL   AST 42 (*) 0 - 37 U/L   ALT 55 (*) 0 - 35 U/L   Alkaline Phosphatase 103  39 - 117 U/L   Total Bilirubin 0.5  0.3 - 1.2 mg/dL   GFR calc non Af Amer >90  >90 mL/min   GFR calc Af Amer >90  >90 mL/min   Comment: (NOTE)     The eGFR has been calculated using  the CKD EPI equation.     This calculation has not been validated in all clinical situations.     eGFR's persistently <90 mL/min signify possible Chronic Kidney     Disease.   Anion gap 14  5 - 15  ETHANOL     Status: None   Collection Time    05/18/14 11:12 AM      Result Value Ref Range   Alcohol, Ethyl (B) <11  0 - 11 mg/dL   Comment:            LOWEST DETECTABLE LIMIT FOR     SERUM ALCOHOL IS 11 mg/dL     FOR MEDICAL PURPOSES ONLY  SALICYLATE LEVEL     Status: Abnormal   Collection Time    05/18/14 11:12 AM      Result Value Ref Range   Salicylate Lvl <9.6 (*) 2.8 - 20.0 mg/dL  ACETAMINOPHEN  LEVEL     Status: None   Collection Time    05/18/14 11:12 AM      Result Value Ref Range   Acetaminophen (Tylenol), Serum <15.0  10 - 30 ug/mL   Comment:            THERAPEUTIC CONCENTRATIONS VARY     SIGNIFICANTLY. A RANGE OF 10-30     ug/mL MAY BE AN EFFECTIVE     CONCENTRATION FOR MANY PATIENTS.     HOWEVER, SOME ARE BEST TREATED     AT CONCENTRATIONS OUTSIDE THIS     RANGE.     ACETAMINOPHEN CONCENTRATIONS     >150 ug/mL AT 4 HOURS AFTER     INGESTION AND >50 ug/mL AT 12     HOURS AFTER INGESTION ARE     OFTEN ASSOCIATED WITH TOXIC     REACTIONS.  POC URINE PREG, ED     Status: None   Collection Time    05/18/14 11:17 AM      Result Value Ref Range   Preg Test, Ur NEGATIVE  NEGATIVE   Comment:            THE SENSITIVITY OF THIS     METHODOLOGY IS >24 mIU/mL  LITHIUM LEVEL     Status: Abnormal   Collection Time    05/18/14 12:08 PM      Result Value Ref Range   Lithium Lvl 1.49 (*) 0.80 - 1.40 mEq/L   Labs are reviewed and are pertinent for no medical issues.  Current Facility-Administered Medications  Medication Dose Route Frequency Provider Last Rate Last Dose  . acetaminophen (TYLENOL) tablet 650 mg  650 mg Oral Q4H PRN Orlie Dakin, MD      . benztropine (COGENTIN) tablet 0.5 mg  0.5 mg Oral QHS Orlie Dakin, MD      . diphenhydrAMINE (BENADRYL) tablet 25 mg  25 mg Oral Q6H PRN Orlie Dakin, MD      . docusate sodium (COLACE) capsule 100 mg  100 mg Oral BID Orlie Dakin, MD   100 mg at 05/19/14 0946  . gabapentin (NEURONTIN) capsule 200 mg  200 mg Oral QID Waylan Boga, NP      . hydrOXYzine (ATARAX/VISTARIL) tablet 50 mg  50 mg Oral QHS Waylan Boga, NP      . LORazepam (ATIVAN) tablet 1 mg  1 mg Oral Q6H PRN Orlie Dakin, MD   1 mg at 05/18/14 2036  . multivitamin with minerals tablet 1 tablet  1 tablet Oral Daily Orlie Dakin, MD   1 tablet at 05/19/14  0962  . OLANZapine (ZYPREXA) tablet 25 mg  25 mg Oral QHS Orlie Dakin, MD   25 mg at 05/18/14  2035  . pantoprazole (PROTONIX) EC tablet 20 mg  20 mg Oral Daily Orlie Dakin, MD   20 mg at 05/19/14 0947  . zolpidem (AMBIEN) tablet 5 mg  5 mg Oral QHS PRN Polly Cobia, [facility]       Current Outpatient Prescriptions  Medication Sig Dispense Refill  . diphenhydrAMINE (BENADRYL) 25 MG tablet Take 25 mg by mouth every 6 (six) hours as needed for itching, allergies or sleep.      Mariane Baumgarten Sodium (DSS) 100 MG CAPS Take 100 mg by mouth 2 (two) times daily. (This medicine may be purchased from over the counter at yr local pharmacy): For constipation      . gabapentin (NEURONTIN) 300 MG capsule Take 1 capsule (300 mg total) by mouth 3 (three) times daily.  90 capsule  0  . OLANZapine (ZYPREXA) 5 MG tablet Take 5 tablets (25 mg total) by mouth at bedtime. For mood control.  150 tablet  0  . pantoprazole (PROTONIX) 20 MG tablet Take 1 tablet (20 mg total) by mouth daily. For acid reflux.  30 tablet  0  . zolpidem (AMBIEN) 10 MG tablet Take 1 tablet (10 mg total) by mouth at bedtime as needed for sleep.  30 tablet  0  . benztropine (COGENTIN) 0.5 MG tablet Take 1 tablet (0.5 mg total) by mouth at bedtime.  30 tablet  0  . gabapentin (NEURONTIN) 100 MG capsule Take 2 capsules (200 mg total) by mouth 4 (four) times daily.  120 capsule  0  . hydrOXYzine (ATARAX/VISTARIL) 50 MG tablet Take 1 tablet (50 mg total) by mouth at bedtime.  30 tablet  0  . lithium carbonate 150 MG capsule Take 3 capsules (450 mg total) by mouth every evening. For mood control.  90 capsule  0  . lithium carbonate 300 MG capsule Take 1 capsule (300 mg total) by mouth daily with breakfast.  30 capsule  0  . Multiple Vitamin (MULTIVITAMIN WITH MINERALS) TABS tablet Take 1 tablet by mouth daily.       Psychiatric Specialty Exam:     Blood pressure 119/71, pulse 93, temperature 97.7 F (36.5 C), temperature source Oral, resp. rate 15, SpO2 99.00%.There is no weight on file to calculate BMI.  General Appearance: Casual  Eye  Contact::  Good  Speech:  Normal Rate  Volume:  Normal  Mood:  Anxious  Affect:  Congruent  Thought Process:  Coherent  Orientation:  Full (Time, Place, and Person)  Thought Content:  WDL  Suicidal Thoughts:  No  Homicidal Thoughts:  No  Memory:  Immediate;   Good Recent;   Good Remote;   Good  Judgement:  Fair  Insight:  Fair  Psychomotor Activity:  Normal  Concentration:  Good  Recall:  Good  Fund of Knowledge:Fair  Language: Good  Akathisia:  No  Handed:  Right  AIMS (if indicated):     Assets:  Communication Skills Desire for Improvement Financial Resources/Insurance Housing Leisure Time Physical Health Resilience Social Support  Sleep:       Musculoskeletal: Strength & Muscle Tone: within normal limits Gait & Station: normal Patient leans: N/A  Treatment Plan Summary: Discharge home with follow-up with her regular provider, Rx for gabapentin  and vistaril given.  Waylan Boga, [place] 05/19/2014 3:04 PM  Patient seen, evaluated and I agree with notes by  Nurse Practitioner. Corena Pilgrim, MD

## 2014-05-21 ENCOUNTER — Emergency Department (HOSPITAL_COMMUNITY)
Admission: EM | Admit: 2014-05-21 | Discharge: 2014-05-23 | Disposition: A | Discharge status: Home / Self Care | Payer: BC Managed Care – PPO | Attending: Emergency Medicine | Admitting: Emergency Medicine

## 2014-05-21 ENCOUNTER — Encounter (HOSPITAL_COMMUNITY): Payer: Self-pay | Admitting: Emergency Medicine

## 2014-05-21 DIAGNOSIS — Z9889 Other specified postprocedural states: Secondary | ICD-10-CM | POA: Diagnosis not present

## 2014-05-21 DIAGNOSIS — F319 Bipolar disorder, unspecified: Secondary | ICD-10-CM | POA: Diagnosis not present

## 2014-05-21 DIAGNOSIS — R531 Weakness: Secondary | ICD-10-CM | POA: Diagnosis not present

## 2014-05-21 DIAGNOSIS — Z79899 Other long term (current) drug therapy: Secondary | ICD-10-CM | POA: Insufficient documentation

## 2014-05-21 DIAGNOSIS — Z87891 Personal history of nicotine dependence: Secondary | ICD-10-CM | POA: Insufficient documentation

## 2014-05-21 DIAGNOSIS — Z8719 Personal history of other diseases of the digestive system: Secondary | ICD-10-CM | POA: Diagnosis not present

## 2014-05-21 DIAGNOSIS — F22 Delusional disorders: Secondary | ICD-10-CM | POA: Diagnosis not present

## 2014-05-21 DIAGNOSIS — Z008 Encounter for other general examination: Secondary | ICD-10-CM | POA: Diagnosis present

## 2014-05-21 LAB — COMPREHENSIVE METABOLIC PANEL
ALT: 55 U/L — ABNORMAL HIGH (ref 0–35)
ANION GAP: 12 (ref 5–15)
AST: 40 U/L — ABNORMAL HIGH (ref 0–37)
Albumin: 4.2 g/dL (ref 3.5–5.2)
Alkaline Phosphatase: 100 U/L (ref 39–117)
BUN: 15 mg/dL (ref 6–23)
CALCIUM: 10.3 mg/dL (ref 8.4–10.5)
CO2: 23 meq/L (ref 19–32)
CREATININE: 0.64 mg/dL (ref 0.50–1.10)
Chloride: 103 mEq/L (ref 96–112)
GLUCOSE: 101 mg/dL — AB (ref 70–99)
POTASSIUM: 4.3 meq/L (ref 3.7–5.3)
SODIUM: 138 meq/L (ref 137–147)
Total Bilirubin: 0.5 mg/dL (ref 0.3–1.2)
Total Protein: 8 g/dL (ref 6.0–8.3)

## 2014-05-21 LAB — CBC
HCT: 39.5 % (ref 36.0–46.0)
Hemoglobin: 13.1 g/dL (ref 12.0–15.0)
MCH: 29.4 pg (ref 26.0–34.0)
MCHC: 33.2 g/dL (ref 30.0–36.0)
MCV: 88.6 fL (ref 78.0–100.0)
Platelets: 369 10*3/uL (ref 150–400)
RBC: 4.46 MIL/uL (ref 3.87–5.11)
RDW: 13 % (ref 11.5–15.5)
WBC: 8.4 10*3/uL (ref 4.0–10.5)

## 2014-05-21 LAB — RAPID URINE DRUG SCREEN, HOSP PERFORMED
Amphetamines: NOT DETECTED
Barbiturates: NOT DETECTED
Benzodiazepines: NOT DETECTED
COCAINE: NOT DETECTED
OPIATES: NOT DETECTED
Tetrahydrocannabinol: NOT DETECTED

## 2014-05-21 LAB — ACETAMINOPHEN LEVEL

## 2014-05-21 LAB — LITHIUM LEVEL: Lithium Lvl: 1.42 mEq/L — ABNORMAL HIGH (ref 0.80–1.40)

## 2014-05-21 LAB — SALICYLATE LEVEL

## 2014-05-21 LAB — ETHANOL: Alcohol, Ethyl (B): <11 mg/dL (ref 0–11)

## 2014-05-21 MED ORDER — HYDROXYZINE HCL 25 MG PO TABS
50.0000 mg | ORAL_TABLET | Freq: Every day | ORAL | Status: DC
  Administered 2014-05-21 – 2014-05-22 (×2): 50 mg via ORAL
  Filled 2014-05-21 (×2): qty 2

## 2014-05-21 MED ORDER — LITHIUM CARBONATE 300 MG PO CAPS
300.0000 mg | ORAL_CAPSULE | Freq: Two times a day (BID) | ORAL | Status: DC
  Administered 2014-05-22 – 2014-05-23 (×3): 300 mg via ORAL
  Filled 2014-05-21 (×3): qty 1

## 2014-05-21 MED ORDER — OLANZAPINE 10 MG PO TABS
20.0000 mg | ORAL_TABLET | Freq: Every day | ORAL | Status: DC
  Administered 2014-05-21: 20 mg via ORAL
  Filled 2014-05-21: qty 2

## 2014-05-21 MED ORDER — BENZTROPINE MESYLATE 1 MG PO TABS
0.5000 mg | ORAL_TABLET | Freq: Every day | ORAL | Status: DC
  Administered 2014-05-21 – 2014-05-22 (×2): 0.5 mg via ORAL
  Filled 2014-05-21 (×2): qty 1

## 2014-05-21 MED ORDER — IBUPROFEN 200 MG PO TABS
600.0000 mg | ORAL_TABLET | Freq: Three times a day (TID) | ORAL | Status: DC | PRN
Start: 2014-05-21 — End: 2014-05-21

## 2014-05-21 MED ORDER — LORAZEPAM 1 MG PO TABS
1.0000 mg | ORAL_TABLET | Freq: Three times a day (TID) | ORAL | Status: DC | PRN
  Administered 2014-05-21 – 2014-05-23 (×6): 1 mg via ORAL
  Filled 2014-05-21 (×6): qty 1

## 2014-05-21 MED ORDER — ACETAMINOPHEN 325 MG PO TABS: 650.0000 mg | ORAL_TABLET | ORAL | Status: DC | PRN

## 2014-05-21 MED ORDER — PANTOPRAZOLE SODIUM 20 MG PO TBEC
20.0000 mg | DELAYED_RELEASE_TABLET | Freq: Every day | ORAL | Status: DC
  Administered 2014-05-21 – 2014-05-23 (×3): 20 mg via ORAL
  Filled 2014-05-21 (×3): qty 1

## 2014-05-21 NOTE — ED Notes (Signed)
Patient in bathroom for several minutes, and fell asleep on toilet. RN assisted pt off toilet and back to her room. Pt closing eyes and attempting to lean forward almost falling. RN instructed pt to open her eyes and try to walk upright. Pt states "You can't tell me what to do!" Upon arriving to patient's room, RN noted urine in large puddle on the floor in front of her bed. Urine wiped up and housekeeping paged to mop floor. Pt back in her bed, side rail up on one side for safety. No s/s of distress noted.

## 2014-05-21 NOTE — ED Notes (Signed)
Pt has attempted to urinate twice, first time pt "forgot" to provide a sample, pt still unable to provide sample.

## 2014-05-21 NOTE — ED Notes (Signed)
Mother: [phone]
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
## 2014-05-21 NOTE — ED Notes (Signed)
Patient hyper verbal and restless. Patient given prn meds for sleep and anxiety as ordered. Patient compliant with medications and did, eventually lay in bed. No s/s of distress noted at this time. Pt denies SI or plans to harm herself.

## 2014-05-21 NOTE — ED Notes (Signed)
Pt is awake and alert, denies HI or SI.   Pt appears to be embellishing at times. Pt sometimes redirectable. Will continue to monitor for safety.

## 2014-05-21 NOTE — ED Notes (Signed)
Counselor at bedside with patient

## 2014-05-21 NOTE — ED Provider Notes (Addendum)
[name]: [id number]     Arrival date & time 05/21/14  1052 History   First MD Initiated Contact with Patient 05/21/14 1112     Chief Complaint  Patient presents with  . psych eval, paranoid, sexual orientation confusion   . Weakness     (Consider location/radiation/quality/duration/timing/severity/associated sxs/prior Treatment) Patient is a 53 y.o. female presenting with weakness. The history is provided by the patient. The history is limited by the condition of the patient.  Weakness  pt with hx bipolar disoder, states she has felt generally weak for 4-5 months, stating she feels her mother may be poisoning her.  She also states that she woke up this morning and was a man, and that through the day she has turned into a woman.  Has wrist band from [facility] and states was there yesterday, but is unsure why or what was done.  Pt is very difficult/limted historian, w psychiatric illness/psychosis - level 5 caveat.      Past Medical History  Diagnosis Date  . Headache(784.0)   . IBS (irritable bowel syndrome)   . Bipolar 1 disorder     HX PSYCHOSIS  . Personality disorder     W/ BORDERLINE FEATURES  . History of duodenal ulcer   . Pelvic pain   . Complication of anesthesia     POST AGGITATION  . Frequency of urination   . Urgency of urination   . Nocturia    Past Surgical History  Procedure Laterality Date  . Laparoscopy N/A 12/14/2012    Procedure: LAPAROSCOPY DIAGNOSTIC;  Surgeon: Stark Klein, MD;  Location: [facility];  Service: General;  Laterality: N/A;  . Laparoscopic left salpingoophorectomy and lysys adhesions  03-10-2002  . Cardiovascular stress test  01-23-2011    NORMAL NUCLEAR STUDY/ NO ISCHEMIA/ EF 81%  . Transthoracic echocardiogram  10-13-2010    NORMAL LVF/  EF 55-60%  . Laparoscopic cholecystectomy  11-08-2001  . Cardiac catheterization  10-09-1999  DR KATZ    NORMAL LVF/  NORMAL RCA/  NO CRITICAL DISEASE LEFT CORONARY SYSTEM  . Laparoscopic removal ovary remnant  2003    [place]  . Total abdominal hysterectomy  2000    W/ RIGHT SALPINGOOPHORECTOMY  . Cystoscopy with biopsy N/A 06/03/2013    Procedure: CYSTOSCOPY WITH BIOPSY  INSTILLATION OF MARCAINE AND PYRIDIUM;  Surgeon: Hanley Ben, MD;  Location: [place];  Service: Urology;  Laterality: N/A;   Family History  Problem Relation Age of Onset  . Sleep apnea Father   . Migraines Sister     headaches  . Other Mother     MAC infection   History  Substance Use Topics  . Smoking status: Former Smoker -- 0.01 packs/day for 1 years    Types: Cigarettes    Quit date: 08/18/2009  . Smokeless tobacco: Never Used     Comment: ONLY SMOKED FOR 6 MONTHS --  QUIT  YRS AGO  . Alcohol Use: 1.8 oz/week    3 Cans of beer per week     Comment: 1-2 beers a week   OB History   Grav Para Term Preterm Abortions TAB SAB Ect Mult Living                    Review of Systems  Unable to perform ROS: Psychiatric disorder  Neurological: Positive for weakness.  level 5 caveat      Allergies  Morphine and related  Home Medications   Prior to Admission medications  Medication Sig Start Date End Date Taking? Authorizing Provider  benztropine (COGENTIN) 0.5 MG tablet Take 1 tablet (0.5 mg total) by mouth at bedtime. 05/19/14   Waylan Boga, NP  Docusate Sodium (DSS) 100 MG CAPS Take 100 mg by mouth 2 (two) times daily. (This medicine may be purchased from over the counter at yr local pharmacy): For constipation 05/16/14   Elmarie Shiley, NP  gabapentin (NEURONTIN) 100 MG capsule Take 2 capsules (200 mg total) by mouth 4 (four) times daily. 05/19/14   Waylan Boga, NP  hydrOXYzine (ATARAX/VISTARIL) 50 MG tablet Take 1 tablet (50 mg total) by mouth at bedtime. 05/19/14   Waylan Boga, NP  lithium carbonate 150 MG capsule Take 3 capsules (450 mg total) by mouth every evening. For mood control. 05/19/14   Waylan Boga, NP  lithium carbonate 300 MG capsule Take 1 capsule (300 mg total) by mouth  daily with breakfast. 05/19/14   Waylan Boga, NP  Multiple Vitamin (MULTIVITAMIN WITH MINERALS) TABS tablet Take 1 tablet by mouth daily. 05/19/14   Waylan Boga, NP  OLANZapine (ZYPREXA) 5 MG tablet Take 5 tablets (25 mg total) by mouth at bedtime. For mood control. 05/16/14   Elmarie Shiley, NP  pantoprazole (PROTONIX) 20 MG tablet Take 1 tablet (20 mg total) by mouth daily. For acid reflux. 05/16/14   Elmarie Shiley, NP   BP 125/70  Pulse 95  Temp(Src) 97.7 F (36.5 C) (Oral)  Resp 20  SpO2 97% Physical Exam  Nursing note and vitals reviewed. Constitutional: She appears well-developed and well-nourished. No distress.  HENT:  Head: Atraumatic.  Mouth/Throat: Oropharynx is clear and moist.  Eyes: Conjunctivae are normal. Pupils are equal, round, and reactive to light. No scleral icterus.  Neck: Neck supple. No tracheal deviation present. No thyromegaly present.  Cardiovascular: Normal rate, regular rhythm, normal heart sounds and intact distal pulses.  Exam reveals no gallop and no friction rub.   No murmur heard. Pulmonary/Chest: Effort normal and breath sounds normal. No respiratory distress.  Abdominal: Soft. Normal appearance and bowel sounds are normal. She exhibits no distension. There is no tenderness.  Genitourinary:  No cva tenderness  Musculoskeletal: She exhibits no edema and no tenderness.  Neurological: She is alert.  Alert, moves bil ext purposefully w normal strength. Motor intact bil. sens intact. Steady gait.   Skin: Skin is warm and dry. No rash noted. She is not diaphoretic.  Psychiatric:  Unusual affect, looking about suspiciously, expresses paranoid thoughts/delusions. Denies SI.      ED Course  Procedures (including critical care time) Labs Review  Results for orders placed during the hospital encounter of 05/21/14  CBC      Result Value Ref Range   WBC 8.4  4.0 - 10.5 K/uL   RBC 4.46  3.87 - 5.11 MIL/uL   Hemoglobin 13.1  12.0 - 15.0 g/dL   HCT 39.5  36.0 -  46.0 %   MCV 88.6  78.0 - 100.0 fL   MCH 29.4  26.0 - 34.0 pg   MCHC 33.2  30.0 - 36.0 g/dL   RDW 13.0  11.5 - 15.5 %   Platelets 369  150 - 400 K/uL  COMPREHENSIVE METABOLIC PANEL      Result Value Ref Range   Sodium 138  137 - 147 mEq/L   Potassium 4.3  3.7 - 5.3 mEq/L   Chloride 103  96 - 112 mEq/L   CO2 23  19 - 32 mEq/L   Glucose, Bld 101 (*)  70 - 99 mg/dL   BUN 15  6 - 23 mg/dL   Creatinine, Ser 0.64  0.50 - 1.10 mg/dL   Calcium 10.3  8.4 - 10.5 mg/dL   Total Protein 8.0  6.0 - 8.3 g/dL   Albumin 4.2  3.5 - 5.2 g/dL   AST 40 (*) 0 - 37 U/L   ALT 55 (*) 0 - 35 U/L   Alkaline Phosphatase 100  39 - 117 U/L   Total Bilirubin 0.5  0.3 - 1.2 mg/dL   GFR calc non Af Amer >90  >90 mL/min   GFR calc Af Amer >90  >90 mL/min   Anion gap 12  5 - 15  URINE RAPID DRUG SCREEN (HOSP PERFORMED)      Result Value Ref Range   Opiates NONE DETECTED  NONE DETECTED   Cocaine NONE DETECTED  NONE DETECTED   Benzodiazepines NONE DETECTED  NONE DETECTED   Amphetamines NONE DETECTED  NONE DETECTED   Tetrahydrocannabinol NONE DETECTED  NONE DETECTED   Barbiturates NONE DETECTED  NONE DETECTED  ETHANOL      Result Value Ref Range   Alcohol, Ethyl (B) <11  0 - 11 mg/dL  LITHIUM LEVEL      Result Value Ref Range   Lithium Lvl 1.42 (*) 0.80 - 1.40 mEq/L  ACETAMINOPHEN LEVEL      Result Value Ref Range   Acetaminophen (Tylenol), Serum <15.0  10 - 30 ug/mL  SALICYLATE LEVEL      Result Value Ref Range   Salicylate Lvl <7.8 (*) 2.8 - 20.0 mg/dL   Dg Chest Port 1 View  04/30/2014   CLINICAL DATA:  Chest pain  EXAM: PORTABLE CHEST - 1 VIEW  COMPARISON:  Prior radiograph from 01/19/2014  FINDINGS: The cardiac and mediastinal silhouettes are stable in size and contour, and remain within normal limits.  The lungs are normally inflated. No airspace consolidation, pleural effusion, or pulmonary edema is identified. There is no pneumothorax.  No acute osseous abnormality identified.  IMPRESSION: No active  cardiopulmonary disease.   Electronically Signed   By: Jeannine Boga M.D.   On: 04/30/2014 22:52      MDM  Labs.  Reviewed nursing notes and prior charts for additional history.   Li level sl high.  Pt denies overdose or overuse. Will decrease daily po dose when ordering home meds (currently taking 300 in am, 450 in pm, will decreased to prior inpt dose of 300 mg bid).  Psych team consulted.  Pt disposition per psych team.      Mirna Mires, MD 05/21/14 [phone]
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
## 2014-05-22 ENCOUNTER — Encounter (HOSPITAL_COMMUNITY): Payer: Self-pay | Admitting: Psychiatry

## 2014-05-22 DIAGNOSIS — F319 Bipolar disorder, unspecified: Secondary | ICD-10-CM

## 2014-05-22 MED ORDER — OLANZAPINE 10 MG PO TABS
20.0000 mg | ORAL_TABLET | Freq: Every day | ORAL | Status: DC
  Administered 2014-05-22: 20 mg via ORAL
  Filled 2014-05-22: qty 2

## 2014-05-22 NOTE — ED Notes (Addendum)
Patient has been resistant to redirection and has been persistent in taking 3 showers today. Pt was educated on safety concerns about taking multiple showers daily including skin care and increased risk of falls/injury related to wet floors. Pt was agitated and did not adhere to education provided. Also, patient was provided with mesh underwear and a pad due to "tinkling" on self.

## 2014-05-22 NOTE — BH Assessment (Addendum)
[facility] Assessment Progress Note      Regional referral completed by [name].  This Probation officer completed IVC initiation and faxed IVC paperwork and supporting [facility] documentation along with the referral form to [facility]. Prescreen complete.  Receipt confirmed by Nicole Kindred at 20:31 on 05/22/14 [facility] authorization 05/22/14-05/28/14 [id number]  This writer spoke with patient's mother regarding her plan of care.  Notified her that the psychiatrist recommends inpatient treatment and due to recent chronicity, it was recommended that we pursue referral to [facility].

## 2014-05-22 NOTE — ED Notes (Signed)
Patient oob to the bathroom with assistance, pt urinated a large amount. Pt assisted back to bed and within less than a minute, was attempting to get oob again. When RN inquired as to why, pt states "I am on Jesus' schedule and He said to pee". RN informed pt that she can try again in a little while. Pt becoming irritated and states "Do not speak over me!" Pt difficult to redirect, but did lay back down.

## 2014-05-22 NOTE — Progress Notes (Signed)
Pt has been assessed and meets criteria for inpatient treatment. Pt has been faxed to: [facility]  Pt has been declined at [facility], [place] and [facility].  TTS will complete an application for [facility].  Will continue to follow for disposition.   Charlene Brooke, MSW Clinical Social Worker [phone]

## 2014-05-22 NOTE — ED Notes (Signed)
Patient up out of bed again, after this RN had repeatedly instructed her to remain in bed due to unsteady gait. Pt holding onto the side rail, swaying with her eyes remaining closed. RN instructed pt to get back into her bed to avoid falling.

## 2014-05-22 NOTE — ED Notes (Signed)
Pt resting in bed with her eyes closed currently; will continue to monitor for safety.
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
## 2014-05-22 NOTE — ED Notes (Addendum)
Consent obtained to release information to patient's mother. Social worker informed.

## 2014-05-22 NOTE — ED Notes (Signed)
Pt ambulated from room to bathroom with no pants on. After the situation was assessed it was determined that the pt urinated in her scrub pants and on the floor. Pt was given clean scrub pants.

## 2014-05-22 NOTE — Progress Notes (Signed)
Patient Discharge Instructions:  After Visit Summary (AVS):   Faxed to:  05/22/14 Discharge Summary Note:   Faxed to:  05/22/14 Psychiatric Admission Assessment Note:   Faxed to:  05/22/14 Suicide Risk Assessment - Discharge Assessment:   Faxed to:  05/22/14 Faxed/Sent to the [facility] Care provider:  05/22/14 Faxed to [facility] @ [phone] Faxed to [facility] @ [phone] Faxed to [facility] - Dr. Casimiro Needle @ [phone]  Patsey Berthold, 05/22/2014, 2:13 PM

## 2014-05-22 NOTE — ED Notes (Signed)
Patient awake and in the bathroom with assistance. Pt with unsteady gait, leaning forward with eyes closed and needing much redirection. Pt assisted back to bed.
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
## 2014-05-22 NOTE — ED Notes (Addendum)
Patient OOB again, unsteady and attempting to walk in room. Pt unable to verbalize reason for being up. Pt requiring much redirection to get back to bed. RN will continue to monitor for safety. Pt states "Commit me so they have to send me to the Med-Psych unit".

## 2014-05-22 NOTE — ED Notes (Signed)
Patient took medication with no resistance.
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
## 2014-05-23 ENCOUNTER — Encounter (HOSPITAL_COMMUNITY): Payer: Self-pay | Admitting: Registered Nurse

## 2014-05-23 LAB — URINE MICROSCOPIC-ADD ON

## 2014-05-23 LAB — URINALYSIS, ROUTINE W REFLEX MICROSCOPIC
Bilirubin Urine: NEGATIVE
Glucose, UA: NEGATIVE mg/dL
Hgb urine dipstick: NEGATIVE
KETONES UR: NEGATIVE mg/dL
Nitrite: NEGATIVE
PROTEIN: NEGATIVE mg/dL
Specific Gravity, Urine: 1.006 (ref 1.005–1.030)
Urobilinogen, UA: 0.2 mg/dL (ref 0.0–1.0)
pH: 7 (ref 5.0–8.0)

## 2014-05-23 MED ORDER — OLANZAPINE 20 MG PO TABS: 20.0000 mg | ORAL_TABLET | Freq: Every day | ORAL | Status: DC

## 2014-05-23 MED ORDER — LITHIUM CARBONATE 300 MG PO CAPS: 300.0000 mg | ORAL_CAPSULE | Freq: Two times a day (BID) | ORAL | Status: DC

## 2014-05-23 NOTE — ED Notes (Signed)
Dr. Sharol Given called and informed that [facility] requested an urine sample in order to consider placement. Telephone orders received and read back.

## 2014-05-23 NOTE — ED Notes (Signed)
Sheriff transported pt to [facility], belongings sent with pt.

## 2014-05-23 NOTE — ED Notes (Signed)
[name] from [facility] called and reported that they needed an urine sample in order to consider placement.
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
## 2014-05-23 NOTE — Discharge Instructions (Signed)

## 2014-05-23 NOTE — ED Notes (Signed)
Patient is restless, walking around milieu with frequent questions. Patient speaking about Conception Chancy and lithium levels. Q15 minute safety checks maintained for safety. Patient is hyperverbal and has moderate confusion. Patient states today is Wednesday September 11th. Patient knows she is currently in a hospital and that the year is 36. Urinalysis print given to CSW to fax to [facility]. [place] [phone]. Fax number [phone].

## 2014-05-23 NOTE — Consult Note (Signed)
Face to face evaluation and I agree with this note 

## 2014-05-23 NOTE — Progress Notes (Signed)
  CARE MANAGEMENT ED NOTE 05/23/2014  Patient:  [name], [name]   Account Number:  [id number]  Date Initiated:  05/23/2014  Documentation initiated by:  [name]  Subjective/Objective Assessment:   53 yr old bcbs [place] ppo c/o "weakness". Denies pain. Then told ems she does know about her sexual orientation.  Pt reports "she feels like she is a woman, that she was born female but became a woman the same day". Pt hx hysterectomy.     Subjective/Objective Assessment Detail:   dx biploar 1 disorder cluster b traits     Action/Plan:   ED Cm received a call from [facility] staff, [name] [name] updated of this   Action/Plan Detail:   Anticipated DC Date:  05/23/2014     Status Recommendation to Physician:   Result of Recommendation:    Other ED [place]  Other  Outpatient Services - Pt will follow up   [place]   Choice offered to / List presented to:           [place]    Status of service:  Completed, signed off  ED Comments:   ED Comments Detail:

## 2014-05-23 NOTE — ED Notes (Addendum)
Patient had a restlessness night tonight. Patient kept wandering the halls and trying to go into other rooms. Patient was hard to redirect at times. Patient went to the restroom several times night and urinated on the floor twice because she was trying to stand up over toilet backwards and use restroom. Patient making odd request like " we need to get a gasoline stove". Patient also makes statements that like " the world was going to end in few minutes". Patient also making statements like "[facility] Pyo is my room" , but patient was watching the Voice on TV. Patient makes inapproiate racial statements " your real black and I'm real white you can't tell me what to do"   Encouragement and support provided and safety maintain.

## 2014-05-23 NOTE — BHH Counselor (Addendum)
Per Larkin Ina at [facility], pt has been accepted by Dr Dareen Piano. # for RN report is [phone]. Pt will be going to [employer]. Writer arranged transportation w/ Therapist, music who sts sheriff's dept will arrive around noon.  Arnold Long, [place] Assessment Counselor

## 2014-05-23 NOTE — ED Notes (Addendum)
[name] at [facility] called and informed that urine sample was collected and sent to lab. [name]  at [facility] reported to call them back when urine results. [name] [phone]

## 2014-07-31 ENCOUNTER — Encounter: Payer: Self-pay | Admitting: Internal Medicine

## 2014-07-31 NOTE — Progress Notes (Signed)
Document opened and reviewed for  visit . No showed .   

## 2014-08-02 ENCOUNTER — Telehealth: Payer: Self-pay | Admitting: Internal Medicine

## 2014-08-02 ENCOUNTER — Ambulatory Visit: Payer: Self-pay | Admitting: Family

## 2014-08-02 NOTE — Telephone Encounter (Signed)
Pt walked in to office for a 3:30 appt previously scheduled with Roxy Cedar for uti.  Pt was advised that several attempts were made to contact her and her emergency contact this morning and messages left to notify her of the need to reschedule her appt due to Padonda being out of the office today unexpectedly.  The patient asked to if she could see Dr. Regis Bill, pt was advised we would have a CMA to come and triage her and speak with her about her symptoms and run things by Dr.Panosh.  Pt was asked to wait in the lobby for the CMA to come talk to her.  Pt refused to wait and walk out of the clinic.    I also spoke with Ms. Nixon on yesterday after she failed to show up for her scheduled appt with pcp on Monday, pt walked in to the office to request lab work that was ordered by Dr. Casimiro Needle.  Dr. Regis Bill agreed to let the patient have labs drawn at our facility to but advised we needed the appropriated dx codes from Dr. Casimiro Needle.  The patient attempted to contact Dr. Karen Chafe office while waiting in the lobby and to get the appropriate codes but was unable to get the information.  The patient stated she would go and have labs drawn at [facility] since that was were the orders where placed with.

## 2015-01-11 DIAGNOSIS — K3184 Gastroparesis: Secondary | ICD-10-CM | POA: Insufficient documentation

## 2015-01-11 DIAGNOSIS — F319 Bipolar disorder, unspecified: Secondary | ICD-10-CM | POA: Insufficient documentation

## 2015-01-11 DIAGNOSIS — G909 Disorder of the autonomic nervous system, unspecified: Secondary | ICD-10-CM

## 2015-01-11 HISTORY — DX: Gastroparesis: K31.84

## 2015-01-11 HISTORY — DX: Disorder of the autonomic nervous system, unspecified: G90.9

## 2015-03-07 ENCOUNTER — Other Ambulatory Visit: Payer: Self-pay | Admitting: Otolaryngology

## 2015-03-07 DIAGNOSIS — R490 Dysphonia: Secondary | ICD-10-CM

## 2015-03-12 LAB — HM MAMMOGRAPHY

## 2015-03-16 ENCOUNTER — Ambulatory Visit
Admission: RE | Admit: 2015-03-16 | Discharge: 2015-03-16 | Disposition: A | Discharge status: Home / Self Care | Payer: Medicaid Other | Source: Ambulatory Visit | Attending: Otolaryngology | Admitting: Otolaryngology

## 2015-03-16 DIAGNOSIS — R490 Dysphonia: Secondary | ICD-10-CM

## 2015-03-20 ENCOUNTER — Encounter: Payer: Self-pay | Admitting: *Deleted

## 2015-03-23 ENCOUNTER — Other Ambulatory Visit: Payer: Self-pay | Admitting: Otolaryngology

## 2015-03-23 DIAGNOSIS — R490 Dysphonia: Secondary | ICD-10-CM

## 2015-03-23 DIAGNOSIS — E041 Nontoxic single thyroid nodule: Secondary | ICD-10-CM

## 2015-04-17 ENCOUNTER — Other Ambulatory Visit (HOSPITAL_COMMUNITY)
Admission: RE | Admit: 2015-04-17 | Discharge: 2015-04-17 | Disposition: A | Discharge status: Home / Self Care | Payer: Medicaid Other | Source: Ambulatory Visit | Attending: Otolaryngology | Admitting: Otolaryngology

## 2015-04-17 ENCOUNTER — Ambulatory Visit
Admission: RE | Admit: 2015-04-17 | Discharge: 2015-04-17 | Disposition: A | Discharge status: Home / Self Care | Payer: Medicaid Other | Source: Ambulatory Visit | Attending: Otolaryngology | Admitting: Otolaryngology

## 2015-04-17 DIAGNOSIS — E041 Nontoxic single thyroid nodule: Secondary | ICD-10-CM

## 2015-04-17 NOTE — Procedures (Signed)
  US guided Rt thyroid nodule biopsy  3  25 g needle FNA 2  25 g needle for AFIRMA  Pt tolerated well Path pending

## 2015-05-01 ENCOUNTER — Encounter (HOSPITAL_COMMUNITY): Payer: Self-pay

## 2015-06-18 ENCOUNTER — Emergency Department (HOSPITAL_COMMUNITY)
Admission: EM | Admit: 2015-06-18 | Discharge: 2015-06-19 | Disposition: A | Discharge status: Other Acute Inpatient Hospital | Payer: Medicaid Other | Attending: Emergency Medicine | Admitting: Emergency Medicine

## 2015-06-18 DIAGNOSIS — T887XXA Unspecified adverse effect of drug or medicament, initial encounter: Secondary | ICD-10-CM | POA: Diagnosis present

## 2015-06-18 DIAGNOSIS — R6884 Jaw pain: Secondary | ICD-10-CM | POA: Diagnosis present

## 2015-06-18 DIAGNOSIS — Z8719 Personal history of other diseases of the digestive system: Secondary | ICD-10-CM | POA: Insufficient documentation

## 2015-06-18 DIAGNOSIS — R633 Feeding difficulties: Secondary | ICD-10-CM | POA: Diagnosis present

## 2015-06-18 DIAGNOSIS — F250 Schizoaffective disorder, bipolar type: Secondary | ICD-10-CM | POA: Diagnosis not present

## 2015-06-18 DIAGNOSIS — R6339 Other feeding difficulties: Secondary | ICD-10-CM | POA: Diagnosis present

## 2015-06-18 DIAGNOSIS — H9201 Otalgia, right ear: Secondary | ICD-10-CM | POA: Diagnosis present

## 2015-06-18 DIAGNOSIS — R634 Abnormal weight loss: Secondary | ICD-10-CM | POA: Diagnosis present

## 2015-06-18 DIAGNOSIS — F259 Schizoaffective disorder, unspecified: Secondary | ICD-10-CM | POA: Diagnosis not present

## 2015-06-18 DIAGNOSIS — R45851 Suicidal ideations: Secondary | ICD-10-CM | POA: Diagnosis not present

## 2015-06-18 DIAGNOSIS — F329 Major depressive disorder, single episode, unspecified: Secondary | ICD-10-CM | POA: Diagnosis present

## 2015-06-18 DIAGNOSIS — R195 Other fecal abnormalities: Secondary | ICD-10-CM | POA: Diagnosis present

## 2015-06-18 DIAGNOSIS — Z79899 Other long term (current) drug therapy: Secondary | ICD-10-CM | POA: Diagnosis not present

## 2015-06-18 DIAGNOSIS — K5900 Constipation, unspecified: Secondary | ICD-10-CM | POA: Diagnosis present

## 2015-06-18 DIAGNOSIS — H6090 Unspecified otitis externa, unspecified ear: Secondary | ICD-10-CM | POA: Diagnosis present

## 2015-06-18 DIAGNOSIS — F3130 Bipolar disorder, current episode depressed, mild or moderate severity, unspecified: Secondary | ICD-10-CM | POA: Insufficient documentation

## 2015-06-18 DIAGNOSIS — Z87891 Personal history of nicotine dependence: Secondary | ICD-10-CM | POA: Insufficient documentation

## 2015-06-18 DIAGNOSIS — F251 Schizoaffective disorder, depressive type: Secondary | ICD-10-CM

## 2015-06-18 LAB — SALICYLATE LEVEL: Salicylate Lvl: <4 mg/dL (ref 2.8–30.0)

## 2015-06-18 LAB — CBC
HCT: 42.7 % (ref 36.0–46.0)
Hemoglobin: 13.9 g/dL (ref 12.0–15.0)
MCH: 29.6 pg (ref 26.0–34.0)
MCHC: 32.6 g/dL (ref 30.0–36.0)
MCV: 90.9 fL (ref 78.0–100.0)
Platelets: 325 10*3/uL (ref 150–400)
RBC: 4.7 MIL/uL (ref 3.87–5.11)
RDW: 12.4 % (ref 11.5–15.5)
WBC: 9.1 10*3/uL (ref 4.0–10.5)

## 2015-06-18 LAB — COMPREHENSIVE METABOLIC PANEL
ALT: 47 U/L (ref 14–54)
AST: 34 U/L (ref 15–41)
Albumin: 4.7 g/dL (ref 3.5–5.0)
Alkaline Phosphatase: 105 U/L (ref 38–126)
Anion gap: 7 (ref 5–15)
BUN: 8 mg/dL (ref 6–20)
CO2: 22 mmol/L (ref 22–32)
Calcium: 10.4 mg/dL — ABNORMAL HIGH (ref 8.9–10.3)
Chloride: 110 mmol/L (ref 101–111)
Creatinine, Ser: 0.7 mg/dL (ref 0.44–1.00)
GFR calc Af Amer: >60 mL/min (ref >60)
GFR calc non Af Amer: >60 mL/min (ref >60)
Glucose, Bld: 102 mg/dL — ABNORMAL HIGH (ref 65–99)
Potassium: 3.7 mmol/L (ref 3.5–5.1)
Sodium: 139 mmol/L (ref 135–145)
Total Bilirubin: 1 mg/dL (ref 0.3–1.2)
Total Protein: 8.2 g/dL — ABNORMAL HIGH (ref 6.5–8.1)

## 2015-06-18 LAB — LITHIUM LEVEL: Lithium Lvl: 0.44 mmol/L — ABNORMAL LOW (ref 0.60–1.20)

## 2015-06-18 LAB — RAPID URINE DRUG SCREEN, HOSP PERFORMED
Amphetamines: NOT DETECTED
Barbiturates: NOT DETECTED
Benzodiazepines: NOT DETECTED
Cocaine: NOT DETECTED
Opiates: NOT DETECTED
Tetrahydrocannabinol: NOT DETECTED

## 2015-06-18 LAB — ACETAMINOPHEN LEVEL: Acetaminophen (Tylenol), Serum: <10 ug/mL — ABNORMAL LOW (ref 10–30)

## 2015-06-18 LAB — ETHANOL: Alcohol, Ethyl (B): <5 mg/dL (ref <5)

## 2015-06-18 MED ORDER — DSS 100 MG PO CAPS: 100.0000 mg | ORAL_CAPSULE | Freq: Two times a day (BID) | ORAL | Status: DC

## 2015-06-18 MED ORDER — GABAPENTIN 100 MG PO CAPS
200.0000 mg | ORAL_CAPSULE | Freq: Four times a day (QID) | ORAL | Status: DC
  Administered 2015-06-18 – 2015-06-19 (×3): 200 mg via ORAL
  Filled 2015-06-18 (×3): qty 2

## 2015-06-18 MED ORDER — HYDROXYZINE HCL 25 MG PO TABS
50.0000 mg | ORAL_TABLET | Freq: Every day | ORAL | Status: DC
  Administered 2015-06-18: 50 mg via ORAL
  Filled 2015-06-18: qty 2

## 2015-06-18 MED ORDER — ACETAMINOPHEN 325 MG PO TABS
650.0000 mg | ORAL_TABLET | Freq: Once | ORAL | Status: AC
  Administered 2015-06-18: 650 mg via ORAL
  Filled 2015-06-18: qty 2

## 2015-06-18 MED ORDER — PANTOPRAZOLE SODIUM 20 MG PO TBEC
20.0000 mg | DELAYED_RELEASE_TABLET | Freq: Every day | ORAL | Status: DC
  Administered 2015-06-18 – 2015-06-19 (×2): 20 mg via ORAL
  Filled 2015-06-18 (×2): qty 1

## 2015-06-18 MED ORDER — DOCUSATE SODIUM 100 MG PO CAPS
100.0000 mg | ORAL_CAPSULE | Freq: Two times a day (BID) | ORAL | Status: DC
  Administered 2015-06-18 – 2015-06-19 (×2): 100 mg via ORAL
  Filled 2015-06-18 (×3): qty 1

## 2015-06-18 MED ORDER — ADULT MULTIVITAMIN W/MINERALS CH
1.0000 | ORAL_TABLET | Freq: Every day | ORAL | Status: DC
  Administered 2015-06-18 – 2015-06-19 (×2): 1 via ORAL
  Filled 2015-06-18 (×2): qty 1

## 2015-06-18 MED ORDER — LITHIUM CARBONATE 300 MG PO CAPS
300.0000 mg | ORAL_CAPSULE | Freq: Two times a day (BID) | ORAL | Status: DC
  Administered 2015-06-18 – 2015-06-19 (×2): 300 mg via ORAL
  Filled 2015-06-18 (×2): qty 1

## 2015-06-18 MED ORDER — OLANZAPINE 10 MG PO TABS
20.0000 mg | ORAL_TABLET | Freq: Every day | ORAL | Status: DC
  Administered 2015-06-18: 20 mg via ORAL
  Filled 2015-06-18: qty 2

## 2015-06-18 NOTE — ED Notes (Addendum)
Hx of bipolar and personality disorder. Pt reports she was SI earlier today with a plan to overdose on her sleeping pills. At present reports she is depressed, and isnt suicidal if she thinks she can get some help. No previous SI attempts. Pt has a lot of stress at this time, property being foreclosed on, no job in 2 years. Pt denies pain. Denies ETOH, cigarette, or drug use. Calm and cooperative. Does have therapist and takes medications.

## 2015-06-18 NOTE — ED Provider Notes (Signed)
[name]: [id number]     Arrival date & time 06/18/15  1424 History   First MD Initiated Contact with Patient 06/18/15 1549     Chief Complaint  Patient presents with  . SI/ Depression      (Consider location/radiation/quality/duration/timing/severity/associated sxs/prior Treatment) HPI   34yf with depression. Increasing financial stress. "I'm about to lose my property." Currently unemployed. 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 Keeps to herself and feels uncomfortable being around others. Recently has been having thoughts of overdosing on ambien. Denies HI. No hallucinations. Denies etoh or drug abuse. Has established psychiatric care and reports compliance with medications.   Past Medical History  Diagnosis Date  . Headache(784.0)   . IBS (irritable bowel syndrome)   . Bipolar 1 disorder     HX PSYCHOSIS  . Personality disorder     W/ BORDERLINE FEATURES  . History of duodenal ulcer   . Pelvic pain   . Complication of anesthesia     POST AGGITATION  . Frequency of urination   . Urgency of urination   . Nocturia    Past Surgical History  Procedure Laterality Date  . Laparoscopy N/A 12/14/2012    Procedure: LAPAROSCOPY DIAGNOSTIC;  Surgeon: Stark Klein, MD;  Location: [facility];  Service: General;  Laterality: N/A;  . Laparoscopic left salpingoophorectomy and lysys adhesions  03-10-2002  . Cardiovascular stress test  01-23-2011    NORMAL NUCLEAR STUDY/ NO ISCHEMIA/ EF 81%  . Transthoracic echocardiogram  10-13-2010    NORMAL LVF/  EF 55-60%  . Laparoscopic cholecystectomy  11-08-2001  . Cardiac catheterization  10-09-1999  DR KATZ    NORMAL LVF/  NORMAL RCA/  NO CRITICAL DISEASE LEFT CORONARY SYSTEM  . Laparoscopic removal ovary remnant  2003   [place]  . Total abdominal hysterectomy  2000    W/ RIGHT SALPINGOOPHORECTOMY  . Cystoscopy with biopsy N/A 06/03/2013    Procedure: CYSTOSCOPY WITH BIOPSY  INSTILLATION OF MARCAINE AND PYRIDIUM;   Surgeon: Hanley Ben, MD;  Location: [facility];  Service: Urology;  Laterality: N/A;   Family History  Problem Relation Age of Onset  . Sleep apnea Father   . Migraines Sister     headaches  . Other Mother     MAC infection   Social History  Substance Use Topics  . Smoking status: Former Smoker -- 0.01 packs/day for 1 years    Types: Cigarettes    Quit date: 08/18/2009  . Smokeless tobacco: Never Used     Comment: ONLY SMOKED FOR 6 MONTHS --  QUIT  YRS AGO  . Alcohol Use: 1.8 oz/week    3 Cans of beer per week     Comment: 1-2 beers a week   OB History    No data available     Review of Systems  All systems reviewed and negative, other than as noted in HPI.   Allergies  Morphine and related  Home Medications   Prior to Admission medications   Medication Sig Start Date End Date Taking? Authorizing Provider  benztropine (COGENTIN) 0.5 MG tablet Take 1 tablet (0.5 mg total) by mouth at bedtime. 05/19/14  Yes Patrecia Pour, NP  gabapentin (NEURONTIN) 300 MG capsule Take 300 mg by mouth at bedtime.    Yes Historical Provider, MD  Levomilnacipran HCl ER 40 MG CP24 Take 1 tablet by mouth at bedtime.    Yes Historical Provider, MD  lithium 600 MG capsule Take 600  mg by mouth at bedtime.    Yes Historical Provider, MD  Paliperidone (INVEGA) 1.5 MG TB24 Take 1.5 mg by mouth every 30 (thirty) days.    Yes Historical Provider, MD  pantoprazole (PROTONIX) 40 MG tablet Take 40 mg by mouth daily.    Yes Historical Provider, MD  zolpidem (AMBIEN) 5 MG tablet Take 5 mg by mouth at bedtime.    Yes Historical Provider, MD  Docusate Sodium (DSS) 100 MG CAPS Take 100 mg by mouth 2 (two) times daily. (This medicine may be purchased from over the counter at yr local pharmacy): For constipation Patient not taking: Reported on 06/18/2015 05/16/14   Niel Hummer, NP  gabapentin (NEURONTIN) 100 MG capsule Take 2 capsules (200 mg total) by mouth 4 (four) times daily. Patient  not taking: Reported on 06/18/2015 05/19/14   Patrecia Pour, NP  hydrOXYzine (ATARAX/VISTARIL) 50 MG tablet Take 1 tablet (50 mg total) by mouth at bedtime. Patient not taking: Reported on 06/18/2015 05/19/14   Patrecia Pour, NP  lithium carbonate 300 MG capsule Take 1 capsule (300 mg total) by mouth 2 (two) times daily with a meal. Patient not taking: Reported on 06/18/2015 05/23/14   Shuvon B Rankin, NP  Multiple Vitamin (MULTIVITAMIN WITH MINERALS) TABS tablet Take 1 tablet by mouth daily. Patient not taking: Reported on 06/18/2015 05/19/14   Patrecia Pour, NP  OLANZapine (ZYPREXA) 20 MG tablet Take 1 tablet (20 mg total) by mouth at bedtime. Patient not taking: Reported on 06/18/2015 05/23/14   Shuvon B Rankin, NP  pantoprazole (PROTONIX) 20 MG tablet Take 1 tablet (20 mg total) by mouth daily. For acid reflux. Patient not taking: Reported on 06/18/2015 05/16/14   Niel Hummer, NP   BP 134/93 mmHg  Pulse 106  Temp(Src) 98.3 F (36.8 C) (Oral)  Resp 16  SpO2 100% Physical Exam  Constitutional: She appears well-developed and well-nourished. No distress.  HENT:  Head: Normocephalic and atraumatic.  Eyes: Conjunctivae are normal. Right eye exhibits no discharge. Left eye exhibits no discharge.  Neck: Neck supple.  Cardiovascular: Normal rate, regular rhythm and normal heart sounds.  Exam reveals no gallop and no friction rub.   No murmur heard. Pulmonary/Chest: Effort normal and breath sounds normal. No respiratory distress.  Abdominal: Soft. She exhibits no distension. There is no tenderness.  Musculoskeletal: She exhibits no edema or tenderness.  Neurological: She is alert.  Skin: Skin is warm and dry.  Psychiatric:  Flat affect. Crying at times. Doesn't appear to be responding to internal stimuli.   Nursing note and vitals reviewed.   ED Course  Procedures (including critical care time) Labs Review Labs Reviewed  COMPREHENSIVE METABOLIC PANEL - Abnormal; Notable for the  following:    Glucose, Bld 102 (*)    Calcium 10.4 (*)    Total Protein 8.2 (*)    All other components within normal limits  ACETAMINOPHEN LEVEL - Abnormal; Notable for the following:    Acetaminophen (Tylenol), Serum <10 (*)    All other components within normal limits  LITHIUM LEVEL - Abnormal; Notable for the following:    Lithium Lvl 0.44 (*)    All other components within normal limits  ETHANOL  SALICYLATE LEVEL  CBC  URINE RAPID DRUG SCREEN, HOSP PERFORMED    Imaging Review No results found. I have personally reviewed and evaluated these images and lab results as part of my medical decision-making.   EKG Interpretation None      MDM  Final diagnoses:  Depression  Suicidal ideation    54yF with increasing depression. Increased financial stress. Recent SI. Will medically clear for psych evaluation. Lithium level added.     Virgel Manifold, MD 06/18/15 [id number]

## 2015-06-18 NOTE — ED Notes (Signed)
Patient was found in chair watching tv upon approach. Patient endorses passive SI with prior plan to OD on sleeping pills but contracts for safety. Denies HI and AVH, states upon pain reassessment that her headache has decreased from an 8/10 to a 2/10. Maintains depressed and flat affect. No signs of patient distress at this time, Q15 minute checks completed for safety.

## 2015-06-18 NOTE — ED Notes (Signed)
Pt advised to admit self to ED for SI with a plan to OD on ambien based on advice from pt's psychiatrist. Pt presents to [facility] with depressed affect. Pt has passive SI, but verbally contracts for safety. Patient denies HI/AVH. Patient has hx of bipolar and c/o of worsening depression over past few weeks. Pt states she has been out of work for 2 years, her house is being foreclosed on, and she has been waking up lately thinking "why live." Pt states she has been "fearful of leaving home" recently due to anxiety. Pt appears to be a good historian.

## 2015-06-19 ENCOUNTER — Inpatient Hospital Stay (HOSPITAL_COMMUNITY)
Admission: AD | Admit: 2015-06-19 | Discharge: 2015-06-27 | DRG: 885 | Disposition: A | Discharge status: Home / Self Care | Payer: Medicaid Other | Source: Intra-hospital | Attending: Psychiatry | Admitting: Psychiatry

## 2015-06-19 ENCOUNTER — Encounter (HOSPITAL_COMMUNITY): Payer: Self-pay | Admitting: Emergency Medicine

## 2015-06-19 DIAGNOSIS — F259 Schizoaffective disorder, unspecified: Secondary | ICD-10-CM | POA: Diagnosis present

## 2015-06-19 DIAGNOSIS — K219 Gastro-esophageal reflux disease without esophagitis: Secondary | ICD-10-CM | POA: Diagnosis present

## 2015-06-19 DIAGNOSIS — F251 Schizoaffective disorder, depressive type: Secondary | ICD-10-CM | POA: Diagnosis not present

## 2015-06-19 DIAGNOSIS — G4700 Insomnia, unspecified: Secondary | ICD-10-CM | POA: Diagnosis present

## 2015-06-19 DIAGNOSIS — R45851 Suicidal ideations: Secondary | ICD-10-CM

## 2015-06-19 DIAGNOSIS — E782 Mixed hyperlipidemia: Secondary | ICD-10-CM | POA: Diagnosis present

## 2015-06-19 DIAGNOSIS — E221 Hyperprolactinemia: Secondary | ICD-10-CM | POA: Clinically undetermined

## 2015-06-19 DIAGNOSIS — F603 Borderline personality disorder: Secondary | ICD-10-CM | POA: Diagnosis present

## 2015-06-19 DIAGNOSIS — E785 Hyperlipidemia, unspecified: Secondary | ICD-10-CM | POA: Diagnosis present

## 2015-06-19 DIAGNOSIS — Z818 Family history of other mental and behavioral disorders: Secondary | ICD-10-CM

## 2015-06-19 DIAGNOSIS — F250 Schizoaffective disorder, bipolar type: Secondary | ICD-10-CM | POA: Diagnosis present

## 2015-06-19 DIAGNOSIS — Z87891 Personal history of nicotine dependence: Secondary | ICD-10-CM | POA: Diagnosis not present

## 2015-06-19 MED ORDER — HYDROXYZINE HCL 25 MG PO TABS
50.0000 mg | ORAL_TABLET | Freq: Three times a day (TID) | ORAL | Status: DC | PRN
Start: 2015-06-19 — End: 2015-06-19

## 2015-06-19 MED ORDER — HYDROXYZINE HCL 50 MG PO TABS
50.0000 mg | ORAL_TABLET | Freq: Three times a day (TID) | ORAL | Status: DC | PRN
Start: 2015-06-19 — End: 2015-06-21
  Administered 2015-06-20: 50 mg via ORAL
  Filled 2015-06-19: qty 1

## 2015-06-19 MED ORDER — GABAPENTIN 300 MG PO CAPS
300.0000 mg | ORAL_CAPSULE | Freq: Two times a day (BID) | ORAL | Status: DC
  Administered 2015-06-19 – 2015-06-27 (×16): 300 mg via ORAL
  Filled 2015-06-19 (×22): qty 1

## 2015-06-19 MED ORDER — LORAZEPAM 0.5 MG PO TABS
0.5000 mg | ORAL_TABLET | Freq: Once | ORAL | Status: AC
  Administered 2015-06-19: 0.5 mg via ORAL
  Filled 2015-06-19: qty 1

## 2015-06-19 MED ORDER — DOCUSATE SODIUM 100 MG PO CAPS
100.0000 mg | ORAL_CAPSULE | Freq: Two times a day (BID) | ORAL | Status: DC
  Administered 2015-06-19 – 2015-06-27 (×16): 100 mg via ORAL
  Filled 2015-06-19 (×22): qty 1

## 2015-06-19 MED ORDER — GABAPENTIN 300 MG PO CAPS: 300.0000 mg | ORAL_CAPSULE | Freq: Two times a day (BID) | ORAL | Status: DC

## 2015-06-19 MED ORDER — ADULT MULTIVITAMIN W/MINERALS CH
1.0000 | ORAL_TABLET | Freq: Every day | ORAL | Status: DC
  Administered 2015-06-20 – 2015-06-27 (×7): 1 via ORAL
  Filled 2015-06-19 (×11): qty 1

## 2015-06-19 MED ORDER — PANTOPRAZOLE SODIUM 20 MG PO TBEC
20.0000 mg | DELAYED_RELEASE_TABLET | Freq: Every day | ORAL | Status: DC
  Administered 2015-06-20 – 2015-06-27 (×8): 20 mg via ORAL
  Filled 2015-06-19 (×11): qty 1

## 2015-06-19 MED ORDER — ACETAMINOPHEN 325 MG PO TABS: 650.0000 mg | ORAL_TABLET | Freq: Four times a day (QID) | ORAL | Status: DC | PRN

## 2015-06-19 MED ORDER — ALUM & MAG HYDROXIDE-SIMETH 200-200-20 MG/5ML PO SUSP: 30.0000 mL | ORAL | Status: DC | PRN

## 2015-06-19 MED ORDER — MAGNESIUM HYDROXIDE 400 MG/5ML PO SUSP
30.0000 mL | Freq: Every day | ORAL | Status: DC | PRN
  Administered 2015-06-23 – 2015-06-24 (×2): 30 mL via ORAL
  Filled 2015-06-19 (×2): qty 30

## 2015-06-19 MED ORDER — LEVOMILNACIPRAN HCL ER 40 MG PO CP24: 1.0000 | ORAL_CAPSULE | Freq: Every day | ORAL | Status: DC

## 2015-06-19 MED ORDER — LITHIUM CARBONATE 300 MG PO CAPS
300.0000 mg | ORAL_CAPSULE | Freq: Two times a day (BID) | ORAL | Status: DC
  Administered 2015-06-19 – 2015-06-20 (×2): 300 mg via ORAL
  Filled 2015-06-19 (×6): qty 1

## 2015-06-19 MED ORDER — OLANZAPINE 10 MG PO TABS
20.0000 mg | ORAL_TABLET | Freq: Every day | ORAL | Status: DC
  Administered 2015-06-19: 20 mg via ORAL
  Filled 2015-06-19 (×4): qty 2

## 2015-06-19 NOTE — BH Assessment (Signed)
[facility] Assessment Progress Note  Per [name] Pilgrim, MD, this pt requires psychiatric hospitalization at this time.  [name] Libra, RN, [facility] has assigned pt to [facility] Rm [id number].  Pt has signed Voluntary Admission and Consent for Treatment, as well as Consent to Release Information to Norma Fredrickson, MD and to Colonel Bald, her outpatient providers at [facility], and a notification call has been placed.  Signed forms have been faxed to [facility].  Pt's nurse, Chrys Racer, has been notified, and agrees to send original paperwork along with pt via [facility], and to call report to [phone].  Jalene Mullet, [place] Triage Specialist [phone]

## 2015-06-19 NOTE — Consult Note (Signed)
[facility] Psychiat[facility] Consult   Reason for Consult:  Suicidal ideations Referring Physician:  EDP Patient Identification: [name] MRN:  [id number] Principal Diagnosis: Schizoaffective disorder [facility]) Diagnosis:   Patient Active Problem List   Diagnosis Date Noted  . Schizoaffective disorder ([place]) [F25.9] 06/19/2015    Priority: High  . Lithium toxicity [T56.891A] 05/01/2014  . Leukocytosis [D72.829] 05/01/2014  . Costochondritis [M94.0] 02/14/2014  . Renal stone [N20.0] 02/13/2014  . Fatty liver disease, nonalcoholic [D42.8] 76/81/1572  . OSA (obstructive sleep apnea) [G47.33] 11/11/2013  . Abnormal LFTs [R79.89] 04/06/2013  . Dysuria [R30.0] 02/15/2013  . Medication side effect [T88.7XXA] 01/07/2013  . Lumpy breasts [N60.29] 12/20/2012  . Lump of breast, left [N63] 12/20/2012  . Loss of weight [R63.4] 11/08/2012  . Food aversion [R63.3] 11/08/2012  . Constipation [K59.00] 05/17/2012  . Right ear pain [H92.01] 02/16/2012  . Jaw pain [R68.84] 02/13/2012  . Otitis externa [H60.90] 02/13/2012  . Vitamin D deficiency [268] 09/23/2011  . Loose stools [R19.5] 09/23/2011  . Double vision [H53.2] 03/15/2011  . Hyperglycemia [R73.9] 01/10/2011  . Dyspnea on effort [R06.09] 01/10/2011  . Palpitations [R00.2] 12/08/2010  . ENDOMETRIOSIS [N80.9] 05/11/2008  . DEFICIENCY, VITAMIN D NOS [E55.9] 06/11/2007  . PLANTAR FASCIITIS [M72.2] 06/11/2007  . ALLERGIC RHINITIS [J30.9] 03/26/2007    Total Time spent with patient: 45 minutes  Subjective:   [name] is a 54 y.o. female patient admitted with suicidal ideations.  HPI:  54 yo female who presented to the ED with suicidal with a plan to overdose on Ambien, history of schizoaffective disorder.  Many stressors:  House being reposed, no work for 2 years, lives alone, and does not know where she will being living.  "I don't want to live, feel pushed into a corner."  Denies homicidal ideations, hallucinations, and  alcohol/drug abuse.  She sees Dr. Marya Fossa in outpatient.    Past Psychiatric History: Depression, schizoaffective disorder  Risk to Self: Suicidal Ideation: Yes-Currently Present Suicidal Intent: Yes-Currently Present Is patient at risk for suicide?: Yes Suicidal Plan?: Yes-Currently Present Specify Current Suicidal Plan: to take her Ambien and not wake up Access to Means: Yes Specify Access to Suicidal Means: can access her medications What has been your use of drugs/alcohol within the last 12 months?: weekly use of alcohol How many times?: 0 Other Self Harm Risks: na-pt denies Triggers for Past Attempts: None known Intentional Self Injurious Behavior: None Risk to Others: Homicidal Ideation: No Thoughts of Harm to Others: No Current Homicidal Intent: No Current Homicidal Plan: No Access to Homicidal Means: No Identified Victim: na-pt denies History of harm to others?: No Assessment of Violence: None Noted Violent Behavior Description: pt cooperative Does patient have access to weapons?: No Criminal Charges Pending?: No Does patient have a court date: Yes Court Date: 08/31/15 (disability hearing) Prior Inpatient Therapy: Prior Inpatient Therapy: Yes Prior Therapy Dates: 2015 and prior date Prior Therapy Facilty/Provider(s): [facility] Reason for Treatment: psychosis Prior Outpatient Therapy: Prior Outpatient Therapy: Yes Prior Therapy Dates: Current and previous unk dates Prior Therapy Facilty/Provider(s): Dr. Casimiro Needle (current), Dr. Letta Moynahan, Dr. Toy Care Reason for Treatment: med mgnt/therapy Does patient have an ACCT team?: No Does patient have Intensive In-House Services?  : No Does patient have [facility] services? : No Does patient have P4CC services?: No  Past Medical History:  Past Medical History  Diagnosis Date  . Headache(784.0)   . IBS (irritable bowel syndrome)   . Bipolar 1 disorder  HX PSYCHOSIS  . Personality disorder     W/ BORDERLINE  FEATURES  . History of duodenal ulcer   . Pelvic pain   . Complication of anesthesia     POST AGGITATION  . Frequency of urination   . Urgency of urination   . Nocturia     Past Surgical History  Procedure Laterality Date  . Laparoscopy N/A 12/14/2012    Procedure: LAPAROSCOPY DIAGNOSTIC;  Surgeon: Stark Klein, MD;  [facility];  Service: General;  Laterality: N/A;  . Laparoscopic left salpingoophorectomy and lysys adhesions  03-10-2002  . Cardiovascular stress test  01-23-2011    NORMAL NUCLEAR STUDY/ NO ISCHEMIA/ EF 81%  . Transthoracic echocardiogram  10-13-2010    NORMAL LVF/  EF 55-60%  . Laparoscopic cholecystectomy  11-08-2001  . Cardiac catheterization  10-09-1999  DR KATZ    NORMAL LVF/  NORMAL RCA/  NO CRITICAL DISEASE LEFT CORONARY SYSTEM  . Laparoscopic removal ovary remnant  2003   [place]  . Total abdominal hysterectomy  2000    W/ RIGHT SALPINGOOPHORECTOMY  . Cystoscopy with biopsy N/A 06/03/2013    Procedure: CYSTOSCOPY WITH BIOPSY  INSTILLATION OF MARCAINE AND PYRIDIUM;  Surgeon: Hanley Ben, MD;  Location: [facility];  Service: Urology;  Laterality: N/A;   Family History:  Family History  Problem Relation Age of Onset  . Sleep apnea Father   . Migraines Sister     headaches  . Other Mother     MAC infection   Family Psychiatric  History: none Social History:  History  Alcohol Use  . 1.8 oz/week  . 3 Cans of beer per week    Comment: 1-2 beers a week     History  Drug Use No    Comment: Patient denies drug use    Social History   Social History  . Marital Status: Single    Spouse Name: N/A  . Number of Children: 0  . Years of Education: college   Occupational History  . N/A     Nurse   Social History Main Topics  . Smoking status: Former Smoker -- 0.01 packs/day for 1 years    Types: Cigarettes    Quit date: 08/18/2009  . Smokeless tobacco: Never Used     Comment: ONLY SMOKED FOR 6 MONTHS --  QUIT  YRS AGO   . Alcohol Use: 1.8 oz/week    3 Cans of beer per week     Comment: 1-2 beers a week  . Drug Use: No     Comment: Patient denies drug use  . Sexual Activity: No   Other Topics Concern  . 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   Now on disability out of work since March.    no tobacco   Caffeine Use: very little, three times a week   Additional Social History:    Pain Medications: see med list Prescriptions: see med list Over the Counter: see med list History of alcohol / drug use?: Yes Longest period of sobriety (when/how long): na Negative Consequences of Use:  (na) Withdrawal Symptoms:  (na) Name of Substance 1: Alcohol 1 - Age of First Use: 16 1 - Amount (size/oz): 1 beer 1 - Frequency: 3 x/week 1 - Duration: ongoing for several mos 1 - Last Use / Amount: day before last-1 beer  Allergies:   Allergies  Allergen Reactions  . Morphine And Related Nausea And Vomiting    Labs:  Results for orders placed or performed during the hospital encounter of 06/18/15 (from the past 48 hour(s))  Urine rapid drug screen (hosp performed) (Not at [facility])     Status: None   Collection Time: 06/18/15  3:38 PM  Result Value Ref Range   Opiates NONE DETECTED NONE DETECTED   Cocaine NONE DETECTED NONE DETECTED   Benzodiazepines NONE DETECTED NONE DETECTED   Amphetamines NONE DETECTED NONE DETECTED   Tetrahydrocannabinol NONE DETECTED NONE DETECTED   Barbiturates NONE DETECTED NONE DETECTED    Comment:        DRUG SCREEN FOR MEDICAL PURPOSES ONLY.  IF CONFIRMATION IS NEEDED FOR ANY PURPOSE, NOTIFY LAB WITHIN 5 DAYS.        LOWEST DETECTABLE LIMITS FOR URINE DRUG SCREEN Drug Class       Cutoff (ng/mL) Amphetamine      1000 Barbiturate      200 Benzodiazepine   854 Tricyclics       627 Opiates          300 Cocaine          300 THC               50   Comprehensive metabolic panel     Status: Abnormal   Collection Time: 06/18/15  3:40 PM  Result Value Ref Range   Sodium 139 135 - 145 mmol/L   Potassium 3.7 3.5 - 5.1 mmol/L   Chloride 110 101 - 111 mmol/L   CO2 22 22 - 32 mmol/L   Glucose, Bld 102 (H) 65 - 99 mg/dL   BUN 8 6 - 20 mg/dL   Creatinine, Ser 0.70 0.44 - 1.00 mg/dL   Calcium 10.4 (H) 8.9 - 10.3 mg/dL   Total Protein 8.2 (H) 6.5 - 8.1 g/dL   Albumin 4.7 3.5 - 5.0 g/dL   AST 34 15 - 41 U/L   ALT 47 14 - 54 U/L   Alkaline Phosphatase 105 38 - 126 U/L   Total Bilirubin 1.0 0.3 - 1.2 mg/dL   GFR calc non Af Amer >60 >60 mL/min   GFR calc Af Amer >60 >60 mL/min    Comment: (NOTE) The eGFR has been calculated using the CKD EPI equation. This calculation has not been validated in all clinical situations. eGFR's persistently <60 mL/min signify possible Chronic Kidney Disease.    Anion gap 7 5 - 15  Ethanol (ETOH)     Status: None   Collection Time: 06/18/15  3:40 PM  Result Value Ref Range   Alcohol, Ethyl (B) <5 <5 mg/dL    Comment:        LOWEST DETECTABLE LIMIT FOR SERUM ALCOHOL IS 5 mg/dL FOR MEDICAL PURPOSES ONLY   Salicylate level     Status: None   Collection Time: 06/18/15  3:40 PM  Result Value Ref Range   Salicylate Lvl <0.3 2.8 - 30.0 mg/dL  Acetaminophen level     Status: Abnormal   Collection Time: 06/18/15  3:40 PM  Result Value Ref Range   Acetaminophen (Tylenol), Serum <10 (L) 10 - 30 ug/mL    Comment:        THERAPEUTIC CONCENTRATIONS VARY SIGNIFICANTLY. A RANGE OF 10-30 ug/mL MAY BE AN EFFECTIVE CONCENTRATION FOR MANY PATIENTS. HOWEVER, SOME ARE BEST TREATED AT CONCENTRATIONS OUTSIDE THIS RANGE. ACETAMINOPHEN CONCENTRATIONS >150 ug/mL AT 4 HOURS AFTER INGESTION AND >50  ug/mL AT 12 HOURS AFTER INGESTION ARE OFTEN ASSOCIATED WITH TOXIC REACTIONS.   CBC     Status: None   Collection Time: 06/18/15  3:40 PM  Result Value Ref Range   WBC 9.1 4.0 - 10.5 K/uL   RBC 4.70  3.87 - 5.11 MIL/uL   Hemoglobin 13.9 12.0 - 15.0 g/dL   HCT 42.7 36.0 - 46.0 %   MCV 90.9 78.0 - 100.0 fL   MCH 29.6 26.0 - 34.0 pg   MCHC 32.6 30.0 - 36.0 g/dL   RDW 12.4 11.5 - 15.5 %   Platelets 325 150 - 400 K/uL  Lithium level     Status: Abnormal   Collection Time: 06/18/15  3:40 PM  Result Value Ref Range   Lithium Lvl 0.44 (L) 0.60 - 1.20 mmol/L    Current Facility-Administered Medications  Medication Dose Route Frequency Provider Last Rate Last Dose  . docusate sodium (COLACE) capsule 100 mg  100 mg Oral BID Virgel Manifold, MD   100 mg at 06/19/15 1008  . gabapentin (NEURONTIN) capsule 300 mg  300 mg Oral BID Rmani Kellogg      . hydrOXYzine (ATARAX/VISTARIL) tablet 50 mg  50 mg Oral TID PRN Therron Sells      . lithium carbonate capsule 300 mg  300 mg Oral BID WC Virgel Manifold, MD   300 mg at 06/19/15 1008  . multivitamin with minerals tablet 1 tablet  1 tablet Oral Daily Virgel Manifold, MD   1 tablet at 06/19/15 1009  . OLANZapine (ZYPREXA) tablet 20 mg  20 mg Oral QHS Virgel Manifold, MD   20 mg at 06/18/15 2122  . pantoprazole (PROTONIX) EC tablet 20 mg  20 mg Oral Daily Virgel Manifold, MD   20 mg at 06/19/15 1007   Current Outpatient Prescriptions  Medication Sig Dispense Refill  . benztropine (COGENTIN) 0.5 MG tablet Take 1 tablet (0.5 mg total) by mouth at bedtime. 30 tablet 0  . gabapentin (NEURONTIN) 300 MG capsule Take 300 mg by mouth at bedtime.     . Levomilnacipran HCl ER 40 MG CP24 Take 1 tablet by mouth at bedtime.     [name] Kitchen lithium 600 MG capsule Take 600 mg by mouth at bedtime.     . Paliperidone (INVEGA) 1.5 MG TB24 Take 1.5 mg by mouth every 30 (thirty) days.     . pantoprazole (PROTONIX) 40 MG tablet Take 40 mg by mouth daily.     [name] Kitchen zolpidem (AMBIEN) 5 MG tablet Take 5 mg by mouth at bedtime.     Mariane Baumgarten Sodium (DSS) 100 MG CAPS Take 100 mg by mouth 2 (two) times daily. (This medicine may be purchased from over the counter at yr local pharmacy): For  constipation (Patient not taking: Reported on 06/18/2015)    . gabapentin (NEURONTIN) 100 MG capsule Take 2 capsules (200 mg total) by mouth 4 (four) times daily. (Patient not taking: Reported on 06/18/2015) 120 capsule 0  . hydrOXYzine (ATARAX/VISTARIL) 50 MG tablet Take 1 tablet (50 mg total) by mouth at bedtime. (Patient not taking: Reported on 06/18/2015) 30 tablet 0  . lithium carbonate 300 MG capsule Take 1 capsule (300 mg total) by mouth 2 (two) times daily with a meal. (Patient not taking: Reported on 06/18/2015) 1 capsule 0  . Multiple Vitamin (MULTIVITAMIN WITH MINERALS) TABS tablet Take 1 tablet by mouth daily. (Patient not taking: Reported on 06/18/2015)    . OLANZapine (ZYPREXA) 20 MG tablet Take 1 tablet (20 mg  total) by mouth at bedtime. (Patient not taking: Reported on 06/18/2015) 1 tablet 0  . pantoprazole (PROTONIX) 20 MG tablet Take 1 tablet (20 mg total) by mouth daily. For acid reflux. (Patient not taking: Reported on 06/18/2015) 30 tablet 0    Musculoskeletal: Strength & Muscle Tone: within normal limits Gait & Station: normal Patient leans: N/A  Psychiatric Specialty Exam: Review of Systems  Constitutional: Negative.   HENT: Negative.   Eyes: Negative.   Respiratory: Negative.   Cardiovascular: Negative.   Gastrointestinal: Negative.   Genitourinary: Negative.   Musculoskeletal: Negative.   Skin: Negative.   Neurological: Negative.   Endo/Heme/Allergies: Negative.   Psychiatric/Behavioral: Positive for depression and suicidal ideas.    Blood pressure 98/76, pulse 85, temperature 98 F (36.7 C), temperature source Oral, resp. rate 16, SpO2 100 %.There is no weight on file to calculate BMI.  General Appearance: Disheveled  Eye Sport and exercise psychologist::  Fair  Speech:  Normal Rate  Volume:  Normal  Mood:  Depressed  Affect:  Congruent  Thought Process:  Coherent  Orientation:  Full (Time, Place, and Person)  Thought Content:  Rumination  Suicidal Thoughts:  Yes.  with  intent/plan  Homicidal Thoughts:  No  Memory:  Immediate;   Fair Recent;   Fair Remote;   Fair  Judgement:  Impaired  Insight:  Fair  Psychomotor Activity:  Decreased  Concentration:  Fair  Recall:  [employer] of Knowledge:Good  Language: Good  Akathisia:  No  Handed:  Right  AIMS (if indicated):     Assets:  Leisure Time Physical Health Resilience  ADL's:  Intact  Cognition: WNL  Sleep:      Treatment Plan Summary: Daily contact with patient to assess and evaluate symptoms and progress in treatment, Medication management and Plan schizoaffective disorder with depression: -Crisis stabilization -Medication management-restarted on her home medications--gabapentin 300 mg BID for stabilization, Lithium 300 mg BID for mood stabilization, Zyprexa 20 mg daily for mood stabilization, Vistaril 50 mg TID PRN po for anxiety -Individual counseling  Disposition: Recommend psychiatric Inpatient admission when medically cleared.  Waylan Boga, [place] 06/19/2015 11:28 AM Patient seen face-to-face for psychiatric evaluation, chart reviewed and case discussed with the physician extender and developed treatment plan. Reviewed the information documented and agree with the treatment plan. Corena Pilgrim, MD
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
## 2015-06-19 NOTE — Progress Notes (Signed)
Adult Psychoeducational Group Note  Date:  06/19/2015 Time:  9:50 PM  Group Topic/Focus:  Wrap-Up Group:   The focus of this group is to help patients review their daily goal of treatment and discuss progress on daily workbooks.  Participation Level:  Active  Participation Quality:  Appropriate  Affect:  Appropriate  Cognitive:  Appropriate  Insight: Appropriate  Engagement in Group:  Engaged  Modes of Intervention:  Discussion  Additional Comments: The patient just arrived at [facility].  Nash Shearer 06/19/2015, 9:50 PM

## 2015-06-19 NOTE — ED Notes (Signed)
Pt states she feels restless and anxious.  Dr Laneta Simmers notified.

## 2015-06-19 NOTE — Tx Team (Signed)
Initial Interdisciplinary Treatment Plan   PATIENT STRESSORS: Financial difficulties Health problems Occupational concerns   PATIENT STRENGTHS: Ability for insight Active sense of humor Average or above average intelligence Communication skills General fund of knowledge Motivation for treatment/growth Supportive family/friends   PROBLEM LIST: Problem List/Patient Goals Date to be addressed Date deferred Reason deferred Estimated date of resolution  Pt states she is anxious and having a break-down. 06/19/2015     Pt feeling depressed and overwhelmed. 06/19/2015     SI with plan to OD on Ambien 06/19/2015                                          DISCHARGE CRITERIA:  Ability to meet basic life and health needs Improved stabilization in mood, thinking, and/or behavior Medical problems require only outpatient monitoring Motivation to continue treatment in a less acute level of care Need for constant or close observation no longer present Reduction of life-threatening or endangering symptoms to within safe limits Safe-care adequate arrangements made Verbal commitment to aftercare and medication compliance  PRELIMINARY DISCHARGE PLAN: Return to previous living arrangement  PATIENT/FAMIILY INVOLVEMENT: This treatment plan has been presented to and reviewed with the patient, [name], and/or family member, N/A.  The patient and family have been given the opportunity to ask questions and make suggestions.  [name], Diane C 06/19/2015, 4:02 PM

## 2015-06-19 NOTE — ED Notes (Signed)
Discharge note:  Patient transferred to [facility] for further inpatient treatment.  Patient admitted for depression and SI.  She is calm and cooperative.  Patient given a set of scrubs and belongings transferred with [place].
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
## 2015-06-19 NOTE — ED Notes (Signed)
Patient is a&ox4. Her affect is sad and mood is depressed. She has stated that she prefers to be placed at [facility] facility opposed to others. She denies pain, HI/AVH. She states passive SI with no plan. Patient was encouraged to perform ADLs. She did not want to take a shower but did ask for new scrubs. She is cooperative and calm. Safety is maintained with 15 min checks and hourly checks.

## 2015-06-20 ENCOUNTER — Encounter (HOSPITAL_COMMUNITY): Payer: Self-pay | Admitting: Psychiatry

## 2015-06-20 DIAGNOSIS — R45851 Suicidal ideations: Secondary | ICD-10-CM

## 2015-06-20 DIAGNOSIS — F603 Borderline personality disorder: Secondary | ICD-10-CM

## 2015-06-20 DIAGNOSIS — F251 Schizoaffective disorder, depressive type: Secondary | ICD-10-CM

## 2015-06-20 MED ORDER — ESZOPICLONE 2 MG PO TABS
1.0000 mg | ORAL_TABLET | Freq: Every day | ORAL | Status: DC
  Administered 2015-06-20: 1 mg via ORAL
  Filled 2015-06-20: qty 1

## 2015-06-20 MED ORDER — BENZTROPINE MESYLATE 0.5 MG PO TABS
0.5000 mg | ORAL_TABLET | Freq: Once | ORAL | Status: AC
  Administered 2015-06-20: 0.5 mg via ORAL
  Filled 2015-06-20 (×2): qty 1

## 2015-06-20 MED ORDER — TRAZODONE HCL 50 MG PO TABS
50.0000 mg | ORAL_TABLET | Freq: Every evening | ORAL | Status: DC | PRN
  Administered 2015-06-21: 50 mg via ORAL
  Filled 2015-06-20: qty 1

## 2015-06-20 MED ORDER — NON FORMULARY: 1.0000 mg | Freq: Every day | Status: DC

## 2015-06-20 MED ORDER — LITHIUM CARBONATE ER 300 MG PO TBCR
300.0000 mg | EXTENDED_RELEASE_TABLET | Freq: Two times a day (BID) | ORAL | Status: DC
  Administered 2015-06-20 – 2015-06-24 (×8): 300 mg via ORAL
  Filled 2015-06-20 (×11): qty 1

## 2015-06-20 NOTE — Progress Notes (Signed)
EKG completed and placed on front of chart.

## 2015-06-20 NOTE — Progress Notes (Signed)
D: Pt presents flat in affect and depressed in mood. Pt reports that her "depression" is her primary problem for this admission. Pt admits to being psychotic during her last hospitalization at [facility]. Pt denies any current psychosis. Pt is currently denying any SI. Pt voices recent thoughts of suicide by overdosing on her Ambien. Pt verbally contracts for safety. Pt reports feeling overwhelmed with the possible foreclosure of her home. Pt acknowledges that she would be unable to afford her house note with her anticipated disability. 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 Pt noted for active participation within the milieu. A: Writer administered scheduled medications to pt, per MD orders. Continued support and availability as needed was extended to this pt. Staff continue to monitor pt with q40min checks.  R: No adverse drug reactions noted. Pt receptive to treatment. Pt remains safe at this time.

## 2015-06-20 NOTE — BHH Group Notes (Signed)
[place] LCSW Group Therapy  06/20/2015 1:53 PM  Type of Therapy: Group Therapy  Participation Level: Invited. Chose not to attend.    Summary of Progress/Problems: Shanon Brow from the [facility] was here to tell his story of recovery and play his guitar.  Kara Mead. Marshell Levan 06/20/2015 1:53 PM

## 2015-06-20 NOTE — H&P (Addendum)
Psychiatric Admission Assessment Adult  Patient Identification: [name] MRN:  [id number] Date of Evaluation:  06/20/2015 Chief Complaint:Patient states " I am so depressed ,I keep thinking about suicide.'  Principal Diagnosis: Schizoaffective disorder, depressive type ([place]) Diagnosis:   Patient Active Problem List   Diagnosis Date Noted  . Borderline personality disorder [F60.3] 06/20/2015  . Schizoaffective disorder, depressive type ([place]) [F25.1] 06/19/2015  . Lithium toxicity [T56.891A] 05/01/2014  . Leukocytosis [D72.829] 05/01/2014  . Costochondritis [M94.0] 02/14/2014  . Renal stone [N20.0] 02/13/2014  . Fatty liver disease, nonalcoholic [N35.6] 70/14/1030  . Abnormal LFTs [R79.89] 04/06/2013  . Dysuria [R30.0] 02/15/2013  . Medication side effect [T88.7XXA] 01/07/2013  . Lumpy breasts [N60.29] 12/20/2012  . Lump of breast, left [N63] 12/20/2012  . Loss of weight [R63.4] 11/08/2012  . Food aversion [R63.3] 11/08/2012  . Constipation [K59.00] 05/17/2012  . Right ear pain [H92.01] 02/16/2012  . Jaw pain [R68.84] 02/13/2012  . Otitis externa [H60.90] 02/13/2012  . Vitamin D deficiency [268] 09/23/2011  . Loose stools [R19.5] 09/23/2011  . Double vision [H53.2] 03/15/2011  . Hyperglycemia [R73.9] 01/10/2011  . Dyspnea on effort [R06.09] 01/10/2011  . Palpitations [R00.2] 12/08/2010  . ENDOMETRIOSIS [N80.9] 05/11/2008  . DEFICIENCY, VITAMIN D NOS [E55.9] 06/11/2007  . PLANTAR FASCIITIS [M72.2] 06/11/2007  . ALLERGIC RHINITIS [J30.9] 03/26/2007   History of Present Illness:: [name] is a 54 y.o. Caucasian female who is divorced , unemployed , awaiting SSD (has upcoming court hearing) , lives byself in a condo , has a hx of schizoaffective disorder , presented to [facility] since she was referred  by her psychiatrist Dt. [name] due to increasing sx of depression. Pt per initial notes in EHR "Reported SI with plan to OD on Ambien."  Patient seen and chart  reviewed.Discussed patient with treatment team. Pt is well known to [facility] , has multiple admissions here in the past. Pt this AM seen in bed , appears depressed. States she has been progressively getting depressed over the past several weeks. Pt reports that she has low energy , sleep issues , sadness , lack of appetite , lack of pleasure in activities that she enjoyed and SI with plan to OD on Ambien. Pt reports that she has recurrent thoughts about suicide . Pt reports several psychosocial stressors including financial issues. Pt reports she is soon going to lose her Condo since she cannot pay the monthly mortgage. Pt reports she has an upcoming SSD hearing which is in January 2017 and she is awaiting her SSD funds to pay her Condo.  Pt reports intense paranoia about people watching her and trying to harm her . Pt reports she has been avoiding going out in public due to her paranoia. Pt denies AH/VH/HI.  Pt reports hx of mood lability , has had manic sx in the past .  Pt denies hx of sexual or physical abuse.  Pt [facility] , has a therapist and psychiatrist there . Pt currently receiving Invega sustenna IM ? 156 mg - last dose was two days ago. Pt also on Lithium , however Li level was low .  Collateral information was obtained from Dr.Pvlosky - who reports pt was feeling very fragile and depressed recently and hence decided to admit her. Pt was doing well until now on the [place] IM .     Associated Signs/Symptoms: Depression Symptoms:  depressed mood, anhedonia, insomnia, psychomotor retardation, feelings of worthlessness/guilt, difficulty concentrating, hopelessness, suicidal thoughts with specific  plan, loss of energy/fatigue, decreased appetite, (Hypo) Manic Symptoms:  Impulsivity, Anxiety Symptoms:  Excessive Worry, Psychotic Symptoms:  Delusions, Ideas of Reference, Paranoia, PTSD Symptoms: Negative Total Time spent with patient: 1  hour  Past Psychiatric History:Pt with hx of mental illness since the past several years . Pt denies suicide attempts. Pt has had multiple hospitalizations - [facility], [facility] [place] . Pt currently follows up with [facility] , has a psychiatrist and therapist there.  Risk to Self: Is patient at risk for suicide?: No Risk to Others:   Prior Inpatient Therapy:   Prior Outpatient Therapy:    Alcohol Screening: 1. How often do you have a drink containing alcohol?: Never 9. Have you or someone else been injured as a result of your drinking?: No 10. Has a relative or friend or a doctor or another health worker been concerned about your drinking or suggested you cut down?: No Alcohol Use Disorder Identification Test Final Score (AUDIT): 0 Substance Abuse History in the last 12 months:  No. Consequences of Substance Abuse: Negative Previous Psychotropic Medications: Yes Lithium 300 mg ,Abilify 30 mg ,depakote (causes headache),Lamictal (caused weakness),Haldol,tegretol Psychological Evaluations: No  Past Medical History:  Past Medical History  Diagnosis Date  . Headache(784.0)   . IBS (irritable bowel syndrome)   . Bipolar 1 disorder (HCC)     HX PSYCHOSIS  . Personality disorder     W/ BORDERLINE FEATURES  . History of duodenal ulcer   . Pelvic pain   . Complication of anesthesia     POST AGGITATION  . Frequency of urination   . Urgency of urination   . Nocturia     Past Surgical History  Procedure Laterality Date  . Laparoscopy N/A 12/14/2012    Procedure: LAPAROSCOPY DIAGNOSTIC;  Surgeon: Stark Klein, MD;  Location: [facility];  Service: General;  Laterality: N/A;  . Laparoscopic left salpingoophorectomy and lysys adhesions  03-10-2002  . Cardiovascular stress test  01-23-2011    NORMAL NUCLEAR STUDY/ NO ISCHEMIA/ EF 81%  . Transthoracic echocardiogram  10-13-2010    NORMAL LVF/  EF 55-60%  . Laparoscopic cholecystectomy  11-08-2001  . Cardiac catheterization  10-09-1999  DR KATZ     NORMAL LVF/  NORMAL RCA/  NO CRITICAL DISEASE LEFT CORONARY SYSTEM  . Laparoscopic removal ovary remnant  2003   [place]  . Total abdominal hysterectomy  2000    W/ RIGHT SALPINGOOPHORECTOMY  . Cystoscopy with biopsy N/A 06/03/2013    Procedure: CYSTOSCOPY WITH BIOPSY  INSTILLATION OF MARCAINE AND PYRIDIUM;  Surgeon: Hanley Ben, MD;  Location: [facility];  Service: Urology;  Laterality: N/A;   Family History:  Family History  Problem Relation Age of Onset  . Sleep apnea Father   . Depression Father   . Migraines Sister     headaches  . Anxiety disorder Sister   . Other Mother     MAC infection  . Anxiety disorder Mother    Family Psychiatric  History: Patient reports her father had depression, anxiety , mother - anxiety and sister -anxiety. Social History: Patient is divorced , unemployed , used to work as a Marine scientist . Pt currently has applied for disability , awaiting SSD hearing. Pt denies having any children. Pt is religious . History  Alcohol Use  . 1.8 oz/week  . 3 Cans of beer per week    Comment: 1-2 beers a week     History  Drug Use No    Comment: Patient  denies drug use    Social History   Social History  . Marital Status: Single    Spouse Name: N/A  . Number of Children: 0  . Years of Education: college   Occupational History  . N/A     Nurse   Social History Main Topics  . Smoking status: Former Smoker -- 0.01 packs/day for 1 years    Types: Cigarettes    Quit date: 08/18/2009  . Smokeless tobacco: Never Used     Comment: ONLY SMOKED FOR 6 MONTHS --  QUIT  YRS AGO  . Alcohol Use: 1.8 oz/week    3 Cans of beer per week     Comment: 1-2 beers a week  . Drug Use: No     Comment: Patient denies drug use  . Sexual Activity: No   Other Topics Concern  . 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    no tobacco   Caffeine Use: very little, three times a week   Additional Social History:    Pain Medications: see mar Prescriptions: see mar Over the Counter: see mar History of alcohol / drug use?: Yes Longest period of sobriety (when/how long): na                    Allergies:   Allergies  Allergen Reactions  . Morphine And Related Nausea And Vomiting  . Morphine Nausea Only  . Bupropion Rash   Lab Results:  Results for orders placed or performed during the hospital encounter of 06/18/15 (from the past 48 hour(s))  Urine rapid drug screen (hosp performed) (Not at [facility])     Status: None   Collection Time: 06/18/15  3:38 PM  Result Value Ref Range   Opiates NONE DETECTED NONE DETECTED   Cocaine NONE DETECTED NONE DETECTED   Benzodiazepines NONE DETECTED NONE DETECTED   Amphetamines NONE DETECTED NONE DETECTED   Tetrahydrocannabinol NONE DETECTED NONE DETECTED   Barbiturates NONE DETECTED NONE DETECTED    Comment:        DRUG SCREEN FOR MEDICAL PURPOSES ONLY.  IF CONFIRMATION IS NEEDED FOR ANY PURPOSE, NOTIFY LAB WITHIN 5 DAYS.        LOWEST DETECTABLE LIMITS FOR URINE DRUG SCREEN Drug Class       Cutoff (ng/mL) Amphetamine      1000 Barbiturate      200 Benzodiazepine   027 Tricyclics       253 Opiates          300 Cocaine          300 THC              50   Comprehensive metabolic panel     Status: Abnormal   Collection Time: 06/18/15  3:40 PM  Result Value Ref Range   Sodium 139 135 - 145 mmol/L   Potassium 3.7 3.5 - 5.1 mmol/L   Chloride 110 101 - 111 mmol/L   CO2 22 22 - 32 mmol/L   Glucose, Bld 102 (H) 65 - 99 mg/dL   BUN 8 6 - 20 mg/dL   Creatinine, Ser 0.70 0.44 - 1.00 mg/dL   Calcium 10.4 (H) 8.9 - 10.3 mg/dL   Total Protein 8.2 (H) 6.5 - 8.1 g/dL   Albumin 4.7 3.5 - 5.0 g/dL  AST 34 15 - 41 U/L   ALT 47 14 - 54 U/L   Alkaline Phosphatase 105 38 - 126 U/L   Total Bilirubin 1.0 0.3 - 1.2 mg/dL   GFR calc non Af  Amer >60 >60 mL/min   GFR calc Af Amer >60 >60 mL/min    Comment: (NOTE) The eGFR has been calculated using the CKD EPI equation. This calculation has not been validated in all clinical situations. eGFR's persistently <60 mL/min signify possible Chronic Kidney Disease.    Anion gap 7 5 - 15  Ethanol (ETOH)     Status: None   Collection Time: 06/18/15  3:40 PM  Result Value Ref Range   Alcohol, Ethyl (B) <5 <5 mg/dL    Comment:        LOWEST DETECTABLE LIMIT FOR SERUM ALCOHOL IS 5 mg/dL FOR MEDICAL PURPOSES ONLY   Salicylate level     Status: None   Collection Time: 06/18/15  3:40 PM  Result Value Ref Range   Salicylate Lvl <2.7 2.8 - 30.0 mg/dL  Acetaminophen level     Status: Abnormal   Collection Time: 06/18/15  3:40 PM  Result Value Ref Range   Acetaminophen (Tylenol), Serum <10 (L) 10 - 30 ug/mL    Comment:        THERAPEUTIC CONCENTRATIONS VARY SIGNIFICANTLY. A RANGE OF 10-30 ug/mL MAY BE AN EFFECTIVE CONCENTRATION FOR MANY PATIENTS. HOWEVER, SOME ARE BEST TREATED AT CONCENTRATIONS OUTSIDE THIS RANGE. ACETAMINOPHEN CONCENTRATIONS >150 ug/mL AT 4 HOURS AFTER INGESTION AND >50 ug/mL AT 12 HOURS AFTER INGESTION ARE OFTEN ASSOCIATED WITH TOXIC REACTIONS.   CBC     Status: None   Collection Time: 06/18/15  3:40 PM  Result Value Ref Range   WBC 9.1 4.0 - 10.5 K/uL   RBC 4.70 3.87 - 5.11 MIL/uL   Hemoglobin 13.9 12.0 - 15.0 g/dL   HCT 42.7 36.0 - 46.0 %   MCV 90.9 78.0 - 100.0 fL   MCH 29.6 26.0 - 34.0 pg   MCHC 32.6 30.0 - 36.0 g/dL   RDW 12.4 11.5 - 15.5 %   Platelets 325 150 - 400 K/uL  Lithium level     Status: Abnormal   Collection Time: 06/18/15  3:40 PM  Result Value Ref Range   Lithium Lvl 0.44 (L) 0.60 - 1.20 mmol/L    Metabolic Disorder Labs:  Lab Results  Component Value Date   HGBA1C 6.0* 04/21/2014   MPG 126* 04/21/2014   No results found for: PROLACTIN Lab Results  Component Value Date   CHOL 201* 02/13/2014   TRIG 251.0* 02/13/2014    HDL 42.30 02/13/2014   CHOLHDL 5 02/13/2014   VLDL 50.2* 02/13/2014   LDLCALC 109* 02/13/2014    Current Medications: Current Facility-Administered Medications  Medication Dose Route Frequency Provider Last Rate Last Dose  . acetaminophen (TYLENOL) tablet 650 mg  650 mg Oral Q6H PRN Patrecia Pour, NP      . alum & mag hydroxide-simeth (MAALOX/MYLANTA) 200-200-20 MG/5ML suspension 30 mL  30 mL Oral Q4H PRN Patrecia Pour, NP      . docusate sodium (COLACE) capsule 100 mg  100 mg Oral BID Patrecia Pour, NP   100 mg at 06/20/15 0834  . gabapentin (NEURONTIN) capsule 300 mg  300 mg Oral BID Patrecia Pour, NP   300 mg at 06/20/15 7824  . hydrOXYzine (ATARAX/VISTARIL) tablet 50 mg  50 mg Oral TID PRN Patrecia Pour, NP      .  lithium carbonate (LITHOBID) CR tablet 300 mg  300 mg Oral Q12H Arien Benincasa, MD      . magnesium hydroxide (MILK OF MAGNESIA) suspension 30 mL  30 mL Oral Daily PRN Patrecia Pour, NP      . multivitamin with minerals tablet 1 tablet  1 tablet Oral Daily Patrecia Pour, NP   1 tablet at 06/20/15 [phone]  . OLANZapine (ZYPREXA) tablet 20 mg  20 mg Oral QHS Patrecia Pour, NP   20 mg at 06/19/15 2059  . pantoprazole (PROTONIX) EC tablet 20 mg  20 mg Oral Daily Patrecia Pour, NP   20 mg at 06/20/15 4010   PTA Medications: Prescriptions prior to admission  Medication Sig Dispense Refill Last Dose  . benztropine (COGENTIN) 0.5 MG tablet Take 1 tablet (0.5 mg total) by mouth at bedtime. 30 tablet 0 06/17/2015 at Unknown time  . Docusate Sodium (DSS) 100 MG CAPS Take 100 mg by mouth 2 (two) times daily. (This medicine may be purchased from over the counter at yr local pharmacy): For constipation (Patient not taking: Reported on 06/18/2015)   Completed Course at Unknown time  . gabapentin (NEURONTIN) 100 MG capsule Take 2 capsules (200 mg total) by mouth 4 (four) times daily. (Patient not taking: Reported on 06/18/2015) 120 capsule 0 Not Taking at Unknown time  . gabapentin  (NEURONTIN) 300 MG capsule Take 300 mg by mouth at bedtime.    06/17/2015 at Unknown time  . hydrOXYzine (ATARAX/VISTARIL) 50 MG tablet Take 1 tablet (50 mg total) by mouth at bedtime. (Patient not taking: Reported on 06/18/2015) 30 tablet 0 Completed Course at Unknown time  . Levomilnacipran HCl ER 40 MG CP24 Take 1 tablet by mouth at bedtime.    06/17/2015 at Unknown time  . lithium 600 MG capsule Take 600 mg by mouth at bedtime.    06/17/2015 at Unknown time  . lithium carbonate 300 MG capsule Take 1 capsule (300 mg total) by mouth 2 (two) times daily with a meal. (Patient not taking: Reported on 06/18/2015) 1 capsule 0 Not Taking at Unknown time  . Multiple Vitamin (MULTIVITAMIN WITH MINERALS) TABS tablet Take 1 tablet by mouth daily. (Patient not taking: Reported on 06/18/2015)   Completed Course at Unknown time  . OLANZapine (ZYPREXA) 20 MG tablet Take 1 tablet (20 mg total) by mouth at bedtime. (Patient not taking: Reported on 06/18/2015) 1 tablet 0 Completed Course at Unknown time  . Paliperidone (INVEGA) 1.5 MG TB24 Take 1.5 mg by mouth every 30 (thirty) days.    06/18/2015 at 1400  . pantoprazole (PROTONIX) 20 MG tablet Take 1 tablet (20 mg total) by mouth daily. For acid reflux. (Patient not taking: Reported on 06/18/2015) 30 tablet 0 Completed Course at Unknown time  . pantoprazole (PROTONIX) 40 MG tablet Take 40 mg by mouth daily.    06/17/2015 at Unknown time  . zolpidem (AMBIEN) 5 MG tablet Take 5 mg by mouth at bedtime.    06/17/2015 at Unknown time    Musculoskeletal: Strength & Muscle Tone: within normal limits Gait & Station: normal Patient leans: N/A  Psychiatric Specialty Exam: Physical Exam  Constitutional:  I concur with PE done in ED.    Review of Systems  Psychiatric/Behavioral: Positive for depression and suicidal ideas. The patient is nervous/anxious and has insomnia.   All other systems reviewed and are negative.   Blood pressure 115/87, pulse 107, temperature  98.1 F (36.7 C), temperature source Oral, resp. rate 20,  height '4\' 11"'  (1.499 m), weight 66.225 kg (146 lb).Body mass index is 29.47 kg/(m^2).  General Appearance: Disheveled  Eye Sport and exercise psychologist::  Fair  Speech:  Slow  Volume:  Decreased  Mood:  Depressed  Affect:  Depressed  Thought Process:  Goal Directed  Orientation:  Full (Time, Place, and Person)  Thought Content:  Delusions, Paranoid Ideation and Rumination  Suicidal Thoughts:  Yes.  with intent/plan  Homicidal Thoughts:  No  Memory:  Immediate;   Fair Recent;   Fair Remote;   Fair  Judgement:  Fair  Insight:  Fair  Psychomotor Activity:  Decreased  Concentration:  Poor  Recall:  [employer] of Knowledge:Fair  Language: Fair  Akathisia:  No  Handed:  Right  AIMS (if indicated):     Assets:  Communication Skills Desire for Improvement  ADL's:  Intact  Cognition: WNL  Sleep:  Number of Hours: 6.25     Treatment Plan Summary: Daily contact with patient to assess and evaluate symptoms and progress in treatment and Medication management  Patient will benefit from inpatient treatment and stabilization.  Estimated length of stay is 5-7 days.  Reviewed past medical records,treatment plan.  Will continue Lithium 300 mg po bid . Li level reviewed - 0.44 mmol/l ( 1031/16). Will get another [name] level in 5 days. Pt received Invega Sustenna 156 mg IM recently ( prior to admission). Will not restart Zyprexa - which was started in ED for her sleep. Will start Lunesta 1 mg po qhs for sleep. Make available Vistaril 25 mg po q6h prn for anxiety sx.  Will continue to monitor vitals ,medication compliance and treatment side effects while patient is here.  Will monitor for medical issues as well as call consult as needed.  Reviewed labs ,will order TSH, Lipid panel, hba1c, PL , EKG . Collateral information obtained from Dr.Pvlosky - he will call me back to discuss more about the pt . See above. CSW will start working on disposition.   Patient to participate in therapeutic milieu .       Observation Level/Precautions:  15 minute checks    Psychotherapy:  Individual and group therapy     Consultations:  Social worker  Discharge Concerns:  Stability, safety       I certify that inpatient services furnished can reasonably be expected to improve the patient's condition.   Jehan Ranganathan MD 11/2/201611:57 AM

## 2015-06-20 NOTE — Progress Notes (Signed)
D:Per patient self inventory form pt reports she slept good last night with the use of sleep medication. She reports a good appetite, low energy, good concentration. She rates depression 8/10, hopelessness 4/10, anxiety 6/10- all on 0-10 scale, 10 being the worse. Pt denies S/S of withdrawals. Pt denies SI/HI. Denies AVH. Denies physical pain. Pt reports her goal is "My goal is to get on anti-depressant" she reports she will "talk to MD" to help meet her goal. Pt behavior calm and cooperative. Requesting wanting to be on extended release Lithium.   A:Special checks q 75mins in place for safety. Medication administered per MD order (see eMAR) Encouragement and support provided. MD notified of pt medication request during treatment team meeting.  R:Safety maintained. Compliant with medication regimen. Will continue to monitor.

## 2015-06-20 NOTE — BHH Suicide Risk Assessment (Signed)
[facility] Admission Suicide Risk Assessment   Nursing information obtained from:  Patient Demographic factors:  Caucasian, Divorced or widowed, Abner Greenspan, lesbian, or bisexual orientation, Low socioeconomic status, Living alone Current Mental Status:  Suicidal ideation indicated by patient, Suicidal ideation indicated by others, Suicide plan, Self-harm thoughts, Intention to act on suicide plan Loss Factors:  Financial problems / change in socioeconomic status, Decline in physical health, Loss of significant relationship Historical Factors:  Family history of mental illness or substance abuse, Impulsivity Risk Reduction Factors:  Positive social support, Sense of responsibility to family Total Time spent with patient: 30 minutes Principal Problem: Schizoaffective disorder, depressive type ([place]) Diagnosis:   Patient Active Problem List   Diagnosis Date Noted  . Borderline personality disorder [F60.3] 06/20/2015  . Schizoaffective disorder, depressive type (Jay) [F25.1] 06/19/2015  . Lithium toxicity [T56.891A] 05/01/2014  . Leukocytosis [D72.829] 05/01/2014  . Costochondritis [M94.0] 02/14/2014  . Renal stone [N20.0] 02/13/2014  . Fatty liver disease, nonalcoholic [E70.3] 50/04/3817  . Abnormal LFTs [R79.89] 04/06/2013  . Dysuria [R30.0] 02/15/2013  . Medication side effect [T88.7XXA] 01/07/2013  . Lumpy breasts [N60.29] 12/20/2012  . Lump of breast, left [N63] 12/20/2012  . Loss of weight [R63.4] 11/08/2012  . Food aversion [R63.3] 11/08/2012  . Constipation [K59.00] 05/17/2012  . Right ear pain [H92.01] 02/16/2012  . Jaw pain [R68.84] 02/13/2012  . Otitis externa [H60.90] 02/13/2012  . Vitamin D deficiency [268] 09/23/2011  . Loose stools [R19.5] 09/23/2011  . Double vision [H53.2] 03/15/2011  . Hyperglycemia [R73.9] 01/10/2011  . Dyspnea on effort [R06.09] 01/10/2011  . Palpitations [R00.2] 12/08/2010  . ENDOMETRIOSIS [N80.9] 05/11/2008  . DEFICIENCY, VITAMIN D NOS [E55.9] 06/11/2007  .  PLANTAR FASCIITIS [M72.2] 06/11/2007  . ALLERGIC RHINITIS [J30.9] 03/26/2007     Continued Clinical Symptoms:  Alcohol Use Disorder Identification Test Final Score (AUDIT): 0 The "Alcohol Use Disorders Identification Test", Guidelines for Use in Primary Care, Second Edition.  World Pharmacologist [facility]). Score between 0-7:  no or low risk or alcohol related problems. Score between 8-15:  moderate risk of alcohol related problems. Score between 16-19:  high risk of alcohol related problems. Score 20 or above:  warrants further diagnostic evaluation for alcohol dependence and treatment.   CLINICAL FACTORS:   Personality Disorders:   Cluster B Previous Psychiatric Diagnoses and Treatments Medical Diagnoses and Treatments/Surgeries    Psychiatric Specialty Exam: Physical Exam  ROS  Blood pressure 115/87, pulse 107, temperature 98.1 F (36.7 C), temperature source Oral, resp. rate 20, height 4\' 11"  (1.499 m), weight 66.225 kg (146 lb).Body mass index is 29.47 kg/(m^2).                      Please see H&P.                                    COGNITIVE FEATURES THAT CONTRIBUTE TO RISK:  Closed-mindedness, Polarized thinking and Thought constriction (tunnel vision)    SUICIDE RISK:   Severe:  Frequent, intense, and enduring suicidal ideation, specific plan, no subjective intent, but some objective markers of intent (i.e., choice of lethal method), the method is accessible, some limited preparatory behavior, evidence of impaired self-control, severe dysphoria/symptomatology, multiple risk factors present, and few if any protective factors, particularly a lack of social support.  PLAN OF CARE: Please see H&P.   Medical Decision Making:  Review of Psycho-Social Stressors (1), Review or order  clinical lab tests (1), Review and summation of old records (2), Review of Last Therapy Session (1), Review of Medication Regimen & Side Effects (2) and Review of New  Medication or Change in Dosage (2)  I certify that inpatient services furnished can reasonably be expected to improve the patient's condition.   Ashaki Frosch md 06/20/2015, 10:23 AM

## 2015-06-21 DIAGNOSIS — F250 Schizoaffective disorder, bipolar type: Secondary | ICD-10-CM | POA: Diagnosis present

## 2015-06-21 LAB — LIPID PANEL
CHOLESTEROL: 187 mg/dL (ref 0–200)
HDL: 40 mg/dL — ABNORMAL LOW (ref >40)
LDL Cholesterol: 75 mg/dL (ref 0–99)
Total CHOL/HDL Ratio: 4.7 RATIO
Triglycerides: 359 mg/dL — ABNORMAL HIGH (ref <150)
VLDL: 72 mg/dL — ABNORMAL HIGH (ref 0–40)

## 2015-06-21 LAB — TSH: TSH: 4.505 u[IU]/mL — AB (ref 0.350–4.500)

## 2015-06-21 LAB — T4, FREE: FREE T4: 0.66 ng/dL (ref 0.61–1.12)

## 2015-06-21 MED ORDER — GABAPENTIN 300 MG PO CAPS
300.0000 mg | ORAL_CAPSULE | Freq: Every day | ORAL | Status: DC
  Administered 2015-06-21: 300 mg via ORAL
  Filled 2015-06-21 (×3): qty 1

## 2015-06-21 MED ORDER — ESZOPICLONE 2 MG PO TABS
2.0000 mg | ORAL_TABLET | Freq: Every day | ORAL | Status: DC
  Administered 2015-06-21 – 2015-06-22 (×2): 2 mg via ORAL
  Filled 2015-06-21 (×3): qty 1

## 2015-06-21 MED ORDER — HYDROXYZINE HCL 25 MG PO TABS
25.0000 mg | ORAL_TABLET | Freq: Three times a day (TID) | ORAL | Status: DC | PRN
  Administered 2015-06-26: 25 mg via ORAL
  Filled 2015-06-21: qty 1

## 2015-06-21 MED ORDER — SIMVASTATIN 20 MG PO TABS
20.0000 mg | ORAL_TABLET | Freq: Every day | ORAL | Status: DC
Start: 2015-06-21 — End: 2015-06-27
  Administered 2015-06-21 – 2015-06-26 (×5): 20 mg via ORAL
  Filled 2015-06-21 (×8): qty 1

## 2015-06-21 MED ORDER — HYDROXYZINE HCL 50 MG PO TABS
50.0000 mg | ORAL_TABLET | Freq: Every evening | ORAL | Status: DC | PRN
  Administered 2015-06-21: 50 mg via ORAL
  Filled 2015-06-21: qty 1

## 2015-06-21 NOTE — BHH Group Notes (Signed)
The focus of this group is to educate the patient on the purpose and policies of crisis stabilization and provide a format to answer questions about their admission.  The group details unit policies and expectations of patients while admitted.  Patient did not attend 0900 nurse education orientation group this morning.  Patient stayed in bed sleeping.    

## 2015-06-21 NOTE — Progress Notes (Signed)
D: Patient continues to endorse passive SI. Still feeling reclusive however she did go to [employer] after much coersion. Minimally interactive. Anxiety 0/10 Depression 5/10. Still asking that her cogentin 0.5mg  be added to her medication regimen. She initially refused her Zocor on the am shift however she agreed to take it after we discussed her most recent lipid panel results.  A: Encouragement and support given.  R: Continue to monitor patient for safety and medication effectiveness.

## 2015-06-21 NOTE — Tx Team (Signed)
Interdisciplinary Treatment Plan Update (Adult)  Date:  06/21/2015   Time Reviewed:  1:43 PM   Progress in Treatment: Attending groups: Yes. Participating in groups:  Yes. Taking medication as prescribed:  Yes. Tolerating medication:  Yes. Family/Significant other contact made:  Yes Patient understands diagnosis:  Yes  As evidenced by seeking help with depression, SI Discussing patient identified problems/goals with staff:  Yes, see initial care plan. Medical problems stabilized or resolved:  Yes. Denies suicidal/homicidal ideation: Yes. Issues/concerns per patient self-inventory:  No. Other:  New problem(s) identified:  Discharge Plan or Barriers:  See below  Reason for Continuation of Hospitalization: Depression Medication stabilization Suicidal ideation    EstComments:  [name] is an 54 y.o. female that was referred to [facility] by her psychiatrist Dt. [name] due to increasing sx of depression. Pt seen this day cia tele assessment and this clinician just made aware of needing to see this pt this AM when came on shift at 7 AM. Pt reports SI with a plan to overdose on her Ambien. Pt has had SI before and has had for mos recently, but this is the first definite plan she has had. Pt stated she has been staying in the bed all day, sleeping, not grooming or bathing self, and feels fatigued. Pt denies HI. Pt denies AVH or delusions, but reports a hx of psychosis. Pt stated she has been diagnosed with Bipolar Disorder and now with Schizoaffective Disorder. 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 Pt stated she has never felt this depressed in her life.  Neurontin, Lithium, Lunesta  Estimated length of stay: 3-5 days  New goal(s):  Review of initial/current patient goals per problem list:   Review of initial/current patient goals per problem  list:  1. Goal(s): Patient will participate in aftercare plan   Met: Yes   Target date: 3-5 days post admission date   As evidenced by: Patient will participate within aftercare plan AEB aftercare provider and housing plan at discharge being identified. 06/21/15:  Plans to return home, follow up outpt    2. Goal (s): Patient will exhibit decreased depressive symptoms and suicidal ideations.   Met: No   Target date: 3-5 days post admission date   As evidenced by: Patient will utilize self rating of depression at 3 or below and demonstrate decreased signs of depression or be deemed stable for discharge by MD. 06/21/15:  [name] is endorsing passive SI today, is rating her depression at a 5      5. Goal(s): Patient will demonstrate decreased signs of psychosis  * Met: No  * Target date: 3-5 days post admission date  * As evidenced by: Patient will demonstrate decreased frequency of AVH or return to baseline function 06/20/15  [name] endorses paranoia to the extent that she is avoiding leaving her home.          Attendees: Patient:  06/21/2015 1:43 PM   Family:   06/21/2015 1:43 PM   Physician:  Ursula Alert, MD 06/21/2015 1:43 PM   Nursing:   Grayland Ormond, RN 06/21/2015 1:43 PM   CSW:    Roque Lias, LCSW   06/21/2015 1:43 PM   Other:  06/21/2015 1:43 PM   Other:   06/21/2015 1:43 PM   Other:  Lars Pinks, Nurse CM 06/21/2015 1:43 PM   Other:  [name],  [facility] 06/21/2015 1:43 PM   Other:  Norberto Sorenson, [place]  06/21/2015 1:43 PM   Other:  06/21/2015 1:43 PM   Other:  06/21/2015 1:43 PM   Other:  06/21/2015 1:43 PM   Other:  06/21/2015 1:43 PM   Other:  06/21/2015 1:43 PM   Other:   06/21/2015 1:43 PM    Scribe for Treatment Team:   Trish Mage, 06/21/2015 1:43 PM

## 2015-06-21 NOTE — Progress Notes (Signed)
[facility] MD Progress Note  06/21/2015 1:09 PM [name]  MRN:  [id number] Subjective:  Patient states " I still feel depressed, I did not sleep well last night. I had to take a lot of medications to fall asleep.'  Objective: [name] is a 54 y.o. Caucasian female who is divorced , unemployed , awaiting SSD (has upcoming court hearing) , lives byself in a condo , has a hx of schizoaffective disorder , presented to [facility] since she was referred by her psychiatrist Dt. [name] due to increasing sx of depression. Pt per initial notes in EHR "Reported SI with plan to OD on Ambien."  Patient seen and chart reviewed.Discussed patient with treatment team.  Pt today seen in bed , appears depressed , lethargic , reports she continues to feel sad. Her SI is improving . She reports sleep issues last night - had to try several medications and was able to fall asleep by 2 AM. Pt denies any AH /VH . Pt with several psychosocial stressors as well as upcoming foreclosure of her home . She continues to need a lot os support. She is open to intensive therapy on an out patient basis . CSW to look in to this . Per staff - no disruptive issues noted on the unit , pt continues to be withdrawn and isolative .     Principal Problem: Schizoaffective disorder, bipolar type ([place]) Diagnosis:   Patient Active Problem List   Diagnosis Date Noted  . Schizoaffective disorder, bipolar type ([place]) [F25.0] 06/21/2015  . Borderline personality disorder [F60.3] 06/20/2015  . Lithium toxicity [T56.891A] 05/01/2014  . Leukocytosis [D72.829] 05/01/2014  . Costochondritis [M94.0] 02/14/2014  . Renal stone [N20.0] 02/13/2014  . Fatty liver disease, nonalcoholic [V25.3] 66/44/0347  . Abnormal LFTs [R79.89] 04/06/2013  . Dysuria [R30.0] 02/15/2013  . Medication side effect [T88.7XXA] 01/07/2013  . Lumpy breasts [N60.29] 12/20/2012  . Lump of breast, left [N63] 12/20/2012  . Loss of weight [R63.4] 11/08/2012  . Food  aversion [R63.3] 11/08/2012  . Constipation [K59.00] 05/17/2012  . Right ear pain [H92.01] 02/16/2012  . Jaw pain [R68.84] 02/13/2012  . Otitis externa [H60.90] 02/13/2012  . Vitamin D deficiency [268] 09/23/2011  . Loose stools [R19.5] 09/23/2011  . Double vision [H53.2] 03/15/2011  . Hyperglycemia [R73.9] 01/10/2011  . Dyspnea on effort [R06.09] 01/10/2011  . Palpitations [R00.2] 12/08/2010  . ENDOMETRIOSIS [N80.9] 05/11/2008  . DEFICIENCY, VITAMIN D NOS [E55.9] 06/11/2007  . PLANTAR FASCIITIS [M72.2] 06/11/2007  . ALLERGIC RHINITIS [J30.9] 03/26/2007   Total Time spent with patient: 25 minutes  Past Psychiatric History: Pt with hx of mental illness since the past several years . Pt denies suicide attempts. Pt has had multiple hospitalizations - [facility], [facility] [place] . Pt currently follows up with [facility] , has a psychiatrist and therapist there  Past Medical History:  Past Medical History  Diagnosis Date  . Headache(784.0)   . IBS (irritable bowel syndrome)   . Bipolar 1 disorder (HCC)     HX PSYCHOSIS  . Personality disorder     W/ BORDERLINE FEATURES  . History of duodenal ulcer   . Pelvic pain   . Complication of anesthesia     POST AGGITATION  . Frequency of urination   . Urgency of urination   . Nocturia     Past Surgical History  Procedure Laterality Date  . Laparoscopy N/A 12/14/2012    Procedure: LAPAROSCOPY DIAGNOSTIC;  Surgeon: Stark Klein, MD;  Location: [facility];  Service:  General;  Laterality: N/A;  . Laparoscopic left salpingoophorectomy and lysys adhesions  03-10-2002  . Cardiovascular stress test  01-23-2011    NORMAL NUCLEAR STUDY/ NO ISCHEMIA/ EF 81%  . Transthoracic echocardiogram  10-13-2010    NORMAL LVF/  EF 55-60%  . Laparoscopic cholecystectomy  11-08-2001  . Cardiac catheterization  10-09-1999  DR KATZ    NORMAL LVF/  NORMAL RCA/  NO CRITICAL DISEASE LEFT CORONARY SYSTEM  . Laparoscopic removal ovary remnant  2003   [place]  .  Total abdominal hysterectomy  2000    W/ RIGHT SALPINGOOPHORECTOMY  . Cystoscopy with biopsy N/A 06/03/2013    Procedure: CYSTOSCOPY WITH BIOPSY  INSTILLATION OF MARCAINE AND PYRIDIUM;  Surgeon: Hanley Ben, MD;  Location: [facility];  Service: Urology;  Laterality: N/A;   Family History:  Family History  Problem Relation Age of Onset  . Sleep apnea Father   . Depression Father   . Migraines Sister     headaches  . Anxiety disorder Sister   . Other Mother     MAC infection  . Anxiety disorder Mother    Family Psychiatric  History: Pt reports mother and sister both have anxiety and father - depression.  Social History:  Patient is divorced , unemployed , used to work as a Marine scientist . Pt currently has applied for disability , awaiting SSD hearing. Pt denies having any children. Pt is religious .         History  Alcohol Use  . 1.8 oz/week  . 3 Cans of beer per week    Comment: 1-2 beers a week     History  Drug Use No    Comment: Patient denies drug use    Social History   Social History  . Marital Status: Single    Spouse Name: N/A  . Number of Children: 0  . Years of Education: college   Occupational History  . N/A     Nurse   Social History Main Topics  . Smoking status: Former Smoker -- 0.01 packs/day for 1 years    Types: Cigarettes    Quit date: 08/18/2009  . Smokeless tobacco: Never Used     Comment: ONLY SMOKED FOR 6 MONTHS --  QUIT  YRS AGO  . Alcohol Use: 1.8 oz/week    3 Cans of beer per week     Comment: 1-2 beers a week  . Drug Use: No     Comment: Patient denies drug use  . Sexual Activity: No   Other Topics Concern  . 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    no tobacco   Caffeine Use: very little, three times a week   Additional Social History:    Pain  Medications: see mar Prescriptions: see mar Over the Counter: see mar History of alcohol / drug use?: Yes Longest period of sobriety (when/how long): na                    Sleep: Poor  Appetite:  Poor  Current Medications: Current Facility-Administered Medications  Medication Dose Route Frequency Provider Last Rate Last Dose  . acetaminophen (TYLENOL) tablet 650 mg  650 mg Oral Q6H PRN Patrecia Pour, NP      . alum & mag hydroxide-simeth (MAALOX/MYLANTA) 200-200-20 MG/5ML suspension  30 mL  30 mL Oral Q4H PRN Patrecia Pour, NP      . docusate sodium (COLACE) capsule 100 mg  100 mg Oral BID Patrecia Pour, NP   100 mg at 06/21/15 0841  . eszopiclone (LUNESTA) tablet 2 mg  2 mg Oral QHS Carsen Leaf, MD      . gabapentin (NEURONTIN) capsule 300 mg  300 mg Oral BID Patrecia Pour, NP   300 mg at 06/21/15 0909  . gabapentin (NEURONTIN) capsule 300 mg  300 mg Oral QHS Kyira Volkert, MD      . hydrOXYzine (ATARAX/VISTARIL) tablet 25 mg  25 mg Oral TID PRN Ursula Alert, MD      . hydrOXYzine (ATARAX/VISTARIL) tablet 50 mg  50 mg Oral QHS PRN Ursula Alert, MD      . lithium carbonate (LITHOBID) CR tablet 300 mg  300 mg Oral Q12H Kaianna Dolezal, MD   300 mg at 06/21/15 0847  . magnesium hydroxide (MILK OF MAGNESIA) suspension 30 mL  30 mL Oral Daily PRN Patrecia Pour, NP      . multivitamin with minerals tablet 1 tablet  1 tablet Oral Daily Patrecia Pour, NP   1 tablet at 06/20/15 [id number]  . pantoprazole (PROTONIX) EC tablet 20 mg  20 mg Oral Daily Patrecia Pour, NP   20 mg at 06/21/15 0848  . simvastatin (ZOCOR) tablet 20 mg  20 mg Oral q1800 Ursula Alert, MD        Lab Results:  Results for orders placed or performed during the hospital encounter of 06/19/15 (from the past 48 hour(s))  TSH     Status: Abnormal   Collection Time: 06/21/15  6:45 AM  Result Value Ref Range   TSH 4.505 (H) 0.350 - 4.500 uIU/mL    Comment: Performed at [facility]  Lipid  panel     Status: Abnormal   Collection Time: 06/21/15  6:45 AM  Result Value Ref Range   Cholesterol 187 0 - 200 mg/dL   Triglycerides 359 (H) <150 mg/dL   HDL 40 (L) >40 mg/dL   Total CHOL/HDL Ratio 4.7 RATIO   VLDL 72 (H) 0 - 40 mg/dL   LDL Cholesterol 75 0 - 99 mg/dL    Comment:        Total Cholesterol/HDL:CHD Risk Coronary Heart Disease Risk Table                     Men   Women  1/2 Average Risk   3.4   3.3  Average Risk       5.0   4.4  2 X Average Risk   9.6   7.1  3 X Average Risk  23.4   11.0        Use the calculated Patient Ratio above and the CHD Risk Table to determine the patient's CHD Risk.        ATP III CLASSIFICATION (LDL):  <100     mg/dL   Optimal  100-129  mg/dL   Near or Above                    Optimal  130-159  mg/dL   Borderline  160-189  mg/dL   High  >190     mg/dL   Very High Performed at [facility]     Physical Findings: AIMS: Facial and Oral Movements Muscles of Facial Expression: None, normal Lips and Perioral  Area: None, normal Jaw: None, normal Tongue: None, normal,Extremity Movements Upper (arms, wrists, hands, fingers): None, normal Lower (legs, knees, ankles, toes): None, normal, Trunk Movements Neck, shoulders, hips: None, normal, Overall Severity Severity of abnormal movements (highest score from questions above): None, normal Incapacitation due to abnormal movements: None, normal Patient's awareness of abnormal movements (rate only patient's report): No Awareness, Dental Status Current problems with teeth and/or dentures?: No Does patient usually wear dentures?: No  CIWA:  CIWA-Ar Total: 2 COWS:  COWS Total Score: 1  Musculoskeletal: Strength & Muscle Tone: within normal limits Gait & Station: normal Patient leans: N/A  Psychiatric Specialty Exam: Review of Systems  Constitutional: Positive for malaise/fatigue.  Psychiatric/Behavioral: Positive for depression. The patient is nervous/anxious and has insomnia.    All other systems reviewed and are negative.   Blood pressure 119/83, pulse 119, temperature 98.6 F (37 C), temperature source Oral, resp. rate 18, height 4\' 11"  (1.499 m), weight 66.225 kg (146 lb), SpO2 98 %.Body mass index is 29.47 kg/(m^2).  General Appearance: Casual  Eye Contact::  Fair  Speech:  Normal Rate  Volume:  Decreased  Mood:  Anxious and Depressed  Affect:  Depressed  Thought Process:  Coherent  Orientation:  Full (Time, Place, and Person)  Thought Content:  Rumination  Suicidal Thoughts:  Yes.  without intent/plan improving  Homicidal Thoughts:  No  Memory:  Immediate;   Fair Recent;   Fair Remote;   Fair  Judgement:  Fair  Insight:  Fair  Psychomotor Activity:  Decreased  Concentration:  Poor  Recall:  [employer] of Knowledge:Fair  Language: Fair  Akathisia:  No  Handed:  Right  AIMS (if indicated):     Assets:  Desire for Improvement  ADL's:  Intact  Cognition: WNL  Sleep:  Number of Hours: 5   Treatment Plan Summary: Daily contact with patient to assess and evaluate symptoms and progress in treatment and Medication management  Will continue Lithium 300 mg po bid . Li level reviewed - 0.44 mmol/l ( 1031/16). Will get another [name] level -06/24/15. Pt received Invega Sustenna 156 mg IM recently ( prior to admission)- 06/18/15. Will increase Lunesta to 2 mg po qhs for sleep. Will also make available Vistaril 50 mg po qhs prn for sleep. Increase Gabapentin to 300 mg po tid for anxiety sx. Will continue Vistaril 25 mg po q6h prn for anxiety sx.  Will continue to monitor vitals ,medication compliance and treatment side effects while patient is here.  Will monitor for medical issues as well as call consult as needed.  Reviewed labs ,TSH slightly elevated - likely secondary to lithium therapy. Will get T3,T4. Lipid panel - abnormal - will add Zocor 20 mg po daily , Dietician consult placed. CSW will start working on disposition. Pt is interested in IOP -  CSW will work on this . Patient to participate in therapeutic milieu .    Shantoya Geurts md 06/21/2015, 1:09 PM

## 2015-06-21 NOTE — Plan of Care (Signed)
Problem: Consults Goal: Depression Patient Education See Patient Education Module for education specifics.  Outcome: Progressing Nurse discussed depression/coping skills with patient.        

## 2015-06-21 NOTE — Progress Notes (Addendum)
Patient stated she wants to take cogentin 0.5 mg at night.  Stated she has taken this medication at home approximately 9 months.    D:  Patient's self inventory sheet, patient has poor sleep, sleep medication was not helpful.  Good appetite, low energy level, good concentration.  Rated depression 5, hopeless and anxiety #3.  Denied withdrawals.  SI, no plan, contracts for safety.  Physical problems, blurred vision.  Denied pain.  Goal is to rest to feel better.    Takes long acting lithium at home.  Will discuss medications with MD.  No discharge plans. A:  Refused multivitamin this morning.  Emotional support and encouragement given patient. R:  Denied HI.  Denied A/V hallucinations.  SI, contracts for safety.  Safety maintained with 15 minute checks. Patient slept until noon.  Went to dining room for lunch.

## 2015-06-21 NOTE — Progress Notes (Signed)
Pt attended and participated karaoke wrap-up group this evening.

## 2015-06-21 NOTE — BHH Group Notes (Signed)
[facility] LCSW Group Therapy  06/21/2015 1:15 pm  Type of Therapy: Process Group Therapy  Participation Level:  Active  Participation Quality:  Appropriate  Affect:  Flat  Cognitive:  Oriented  Insight:  Improving  Engagement in Group:  Limited  Engagement in Therapy:  Limited  Modes of Intervention:  Activity, Clarification, Education, Problem-solving and Support  Summary of Progress/Problems: Today's group addressed the issue of overcoming obstacles.  Patients were asked to identify their biggest obstacle post d/c that stands in the way of their on-going success, and then problem solve as to how to manage this.  "I was having thoughts of killing myself before I came to the hospital.  But I am starting to feel better.  And I am not nearly as bad as the last time I was admitted.  I was really out of it then."  Went on to say "It's not real unless you make it real" in re; to negative thoughts.  Suggested to others that the power of positive thinking can overcome many things.  [name] 06/21/2015   1:50 PM

## 2015-06-21 NOTE — Progress Notes (Signed)
D: [name] endorses passive SI but states it is not as severe as it was when she was first admitted. Denies HI.  Her affect is flat but she is pleasant during our conversation. Rates Depression 5/10 Anxiety 2/10. She is requesting to have her lithium prescribed as ER instead of CR as she was taking ER prior to this admission. Her plan when she is discharged is to try not to stay in the house and isolate herself and to get involved in a few support groups. She would like to speak to the SW about groups in her area. She has a sister in [place] and her mother lives in [place]. They are somewhat supportive but she states they have a difficult time understanding her mental health issues.  A: Encouragement and support given.  R: Continue to monitor patient for safety and medication effectiveness.

## 2015-06-22 LAB — T3: T3, Total: 93 ng/dL (ref 71–180)

## 2015-06-22 LAB — HEMOGLOBIN A1C
Hgb A1c MFr Bld: 5.7 % — ABNORMAL HIGH (ref 4.8–5.6)
MEAN PLASMA GLUCOSE: 117 mg/dL

## 2015-06-22 LAB — PROLACTIN: PROLACTIN: 112.5 ng/mL — AB (ref 4.8–23.3)

## 2015-06-22 MED ORDER — GABAPENTIN 400 MG PO CAPS
400.0000 mg | ORAL_CAPSULE | Freq: Every day | ORAL | Status: DC
  Administered 2015-06-22 – 2015-06-25 (×4): 400 mg via ORAL
  Filled 2015-06-22 (×6): qty 1

## 2015-06-22 MED ORDER — BENZTROPINE MESYLATE 0.5 MG PO TABS
0.5000 mg | ORAL_TABLET | Freq: Every day | ORAL | Status: DC
  Administered 2015-06-22 – 2015-06-25 (×4): 0.5 mg via ORAL
  Filled 2015-06-22 (×7): qty 1

## 2015-06-22 MED ORDER — BENZTROPINE MESYLATE 0.5 MG PO TABS
ORAL_TABLET | ORAL | Status: AC
  Filled 2015-06-22: qty 1

## 2015-06-22 NOTE — Progress Notes (Signed)
Adult Psychoeducational Group Note  Date:  06/22/2015 Time:  8:24 PM  Group Topic/Focus:  Wrap-Up Group:   The focus of this group is to help patients review their daily goal of treatment and discuss progress on daily workbooks.  Participation Level:  Active  Participation Quality:  Appropriate  Affect:  Appropriate  Cognitive:  Appropriate  Insight: Good  Engagement in Group:  Engaged  Modes of Intervention:  Discussion  Additional Comments:  Pt rated overall day a 6 out of 10 and stated that she is starting to feel better with her medication. Pt noted that positive interaction with other patients on the unit were highlights of her day. Pt also mentioned that she achieved her goal for the day, which was adjusting her sleep medications.   Lincoln Brigham 06/22/2015, 9:01 PM

## 2015-06-22 NOTE — Progress Notes (Signed)
Nutrition Education Note  RD consulted for nutrition education regarding hyperlipidemia.  Lipid Panel     Component Value Date/Time   CHOL 187 06/21/2015 0645   TRIG 359* 06/21/2015 0645   HDL 40* 06/21/2015 0645   CHOLHDL 4.7 06/21/2015 0645   VLDL 72* 06/21/2015 0645   LDLCALC 75 06/21/2015 0645    RD provided "High Triglycerides Nutrition Therapy" handout from the Academy of [employer] Dietetics. Upon awaking pt she stated that she was not interested in talking with RD at this time. Allowed pt to continue to rest and left handout with her other personal belongings.   Unable to gage expected compliance at this time  Body mass index is 29.47 kg/(m^2). Pt meets criteria for overweight based on current BMI.  Diet Order: Diet Heart Room service appropriate?: Yes; Fluid consistency:: Thin Pt is also offered choice of unit snacks mid-morning and mid-afternoon.  Pt is eating as desired.   Labs and medications reviewed. No further nutrition interventions warranted at this time. If additional nutrition issues arise, please re-consult RD.     Jarome Matin, RD, LDN Inpatient Clinical Dietitian Pager # [phone] After hours/weekend pager # [phone]

## 2015-06-22 NOTE — Progress Notes (Signed)
[facility] MD Progress Note  06/22/2015 2:02 PM [name]  MRN:  [id number] Subjective:  Patient states " I still feel depressed, I slept better last night.'  Objective: [name] is a 54 y.o. Caucasian female who is divorced , unemployed , awaiting SSD (has upcoming court hearing) , lives byself in a condo , has a hx of schizoaffective disorder , presented to [facility] since she was referred by her psychiatrist Dt. [name] due to increasing sx of depression. Pt per initial notes in EHR "Reported SI with plan to OD on Ambien."  Patient seen and chart reviewed.Discussed patient with treatment team.  Pt today seen in bed , continues to appear depressed , lethargic . Pt per nursing continues to be withdrawn , isolative most of the day , with minimal participation in groups. Pt today reports sleep as improved on the Lunesta .  Pt with several psychosocial stressors as well as upcoming foreclosure of her home . She continues to need a lot of support. She is open to intensive therapy on an out patient basis .CSW has been working on referral . Pt with chronic hx of schizoaffective , bipolar disorder , since the past 30 years or so , presented this time with depression and SI. She will continue to need medication readjustment and support.        Principal Problem: Schizoaffective disorder, bipolar type ([place]) Diagnosis:   Patient Active Problem List   Diagnosis Date Noted  . Schizoaffective disorder, bipolar type ([place]) [F25.0] 06/21/2015  . Borderline personality disorder [F60.3] 06/20/2015  . Lithium toxicity [T56.891A] 05/01/2014  . Leukocytosis [D72.829] 05/01/2014  . Costochondritis [M94.0] 02/14/2014  . Renal stone [N20.0] 02/13/2014  . Fatty liver disease, nonalcoholic [W10.9] 32/35/5732  . Abnormal LFTs [R79.89] 04/06/2013  . Dysuria [R30.0] 02/15/2013  . Medication side effect [T88.7XXA] 01/07/2013  . Lumpy breasts [N60.29] 12/20/2012  . Lump of breast, left [N63] 12/20/2012  .  Loss of weight [R63.4] 11/08/2012  . Food aversion [R63.3] 11/08/2012  . Constipation [K59.00] 05/17/2012  . Right ear pain [H92.01] 02/16/2012  . Jaw pain [R68.84] 02/13/2012  . Otitis externa [H60.90] 02/13/2012  . Vitamin D deficiency [268] 09/23/2011  . Loose stools [R19.5] 09/23/2011  . Double vision [H53.2] 03/15/2011  . Hyperglycemia [R73.9] 01/10/2011  . Dyspnea on effort [R06.09] 01/10/2011  . Palpitations [R00.2] 12/08/2010  . ENDOMETRIOSIS [N80.9] 05/11/2008  . DEFICIENCY, VITAMIN D NOS [E55.9] 06/11/2007  . PLANTAR FASCIITIS [M72.2] 06/11/2007  . ALLERGIC RHINITIS [J30.9] 03/26/2007   Total Time spent with patient: 25 minutes  Past Psychiatric History: Pt with hx of mental illness since the past several years . Pt denies suicide attempts. Pt has had multiple hospitalizations - [facility], [facility] [place] . Pt currently follows up with [facility] , has a psychiatrist and therapist there  Past Medical History:  Past Medical History  Diagnosis Date  . Headache(784.0)   . IBS (irritable bowel syndrome)   . Bipolar 1 disorder (HCC)     HX PSYCHOSIS  . Personality disorder     W/ BORDERLINE FEATURES  . History of duodenal ulcer   . Pelvic pain   . Complication of anesthesia     POST AGGITATION  . Frequency of urination   . Urgency of urination   . Nocturia     Past Surgical History  Procedure Laterality Date  . Laparoscopy N/A 12/14/2012    Procedure: LAPAROSCOPY DIAGNOSTIC;  Surgeon: Stark Klein, MD;  Location: [facility];  Service: General;  Laterality: N/A;  . Laparoscopic left salpingoophorectomy and lysys adhesions  03-10-2002  . Cardiovascular stress test  01-23-2011    NORMAL NUCLEAR STUDY/ NO ISCHEMIA/ EF 81%  . Transthoracic echocardiogram  10-13-2010    NORMAL LVF/  EF 55-60%  . Laparoscopic cholecystectomy  11-08-2001  . Cardiac catheterization  10-09-1999  DR KATZ    NORMAL LVF/  NORMAL RCA/  NO CRITICAL DISEASE LEFT CORONARY SYSTEM  . Laparoscopic removal  ovary remnant  2003   [place]  . Total abdominal hysterectomy  2000    W/ RIGHT SALPINGOOPHORECTOMY  . Cystoscopy with biopsy N/A 06/03/2013    Procedure: CYSTOSCOPY WITH BIOPSY  INSTILLATION OF MARCAINE AND PYRIDIUM;  Surgeon: Hanley Ben, MD;  Location: [place];  Service: Urology;  Laterality: N/A;   Family History:  Family History  Problem Relation Age of Onset  . Sleep apnea Father   . Depression Father   . Migraines Sister     headaches  . Anxiety disorder Sister   . Other Mother     MAC infection  . Anxiety disorder Mother    Family Psychiatric  History: Pt reports mother and sister both have anxiety and father - depression.  Social History:  Patient is divorced , unemployed , used to work as a Marine scientist . Pt currently has applied for disability , awaiting SSD hearing. Pt denies having any children. Pt is religious .         History  Alcohol Use  . 1.8 oz/week  . 3 Cans of beer per week    Comment: 1-2 beers a week     History  Drug Use No    Comment: Patient denies drug use    Social History   Social History  . Marital Status: Single    Spouse Name: N/A  . Number of Children: 0  . Years of Education: college   Occupational History  . N/A     Nurse   Social History Main Topics  . Smoking status: Former Smoker -- 0.01 packs/day for 1 years    Types: Cigarettes    Quit date: 08/18/2009  . Smokeless tobacco: Never Used     Comment: ONLY SMOKED FOR 6 MONTHS --  QUIT  YRS AGO  . Alcohol Use: 1.8 oz/week    3 Cans of beer per week     Comment: 1-2 beers a week  . Drug Use: No     Comment: Patient denies drug use  . Sexual Activity: No   Other Topics Concern  . 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    no tobacco   Caffeine Use: very little, three times a week    Additional Social History:    Pain Medications: see mar Prescriptions: see mar Over the Counter: see mar History of alcohol / drug use?: Yes Longest period of sobriety (when/how long): na                    Sleep: Fair  Appetite:  Poor  Current Medications: Current Facility-Administered Medications  Medication Dose Route Frequency Provider Last Rate Last Dose  . acetaminophen (TYLENOL) tablet 650 mg  650 mg Oral Q6H PRN Patrecia Pour, NP      . alum & mag hydroxide-simeth (MAALOX/MYLANTA) 200-200-20 MG/5ML suspension 30 mL  30 mL Oral Q4H PRN Patrecia Pour, NP      . docusate sodium (COLACE) capsule 100 mg  100 mg Oral BID Patrecia Pour, NP   100 mg at 06/22/15 0905  . eszopiclone (LUNESTA) tablet 2 mg  2 mg Oral QHS Ursula Alert, MD   2 mg at 06/21/15 2147  . gabapentin (NEURONTIN) capsule 300 mg  300 mg Oral BID Patrecia Pour, NP   300 mg at 06/22/15 0906  . gabapentin (NEURONTIN) capsule 400 mg  400 mg Oral QHS Marcia Lepera, MD      . hydrOXYzine (ATARAX/VISTARIL) tablet 25 mg  25 mg Oral TID PRN Ursula Alert, MD      . hydrOXYzine (ATARAX/VISTARIL) tablet 50 mg  50 mg Oral QHS PRN Ursula Alert, MD   50 mg at 06/21/15 2308  . lithium carbonate (LITHOBID) CR tablet 300 mg  300 mg Oral Q12H Nemesis Rainwater, MD   300 mg at 06/22/15 0906  . magnesium hydroxide (MILK OF MAGNESIA) suspension 30 mL  30 mL Oral Daily PRN Patrecia Pour, NP      . multivitamin with minerals tablet 1 tablet  1 tablet Oral Daily Patrecia Pour, NP   1 tablet at 06/22/15 0905  . pantoprazole (PROTONIX) EC tablet 20 mg  20 mg Oral Daily Patrecia Pour, NP   20 mg at 06/22/15 0905  . simvastatin (ZOCOR) tablet 20 mg  20 mg Oral q1800 Ursula Alert, MD   20 mg at 06/21/15 2015    Lab Results:  Results for orders placed or performed during the hospital encounter of 06/19/15 (from the past 48 hour(s))  TSH     Status: Abnormal   Collection Time: 06/21/15  6:45 AM  Result Value Ref Range    TSH 4.505 (H) 0.350 - 4.500 uIU/mL    Comment: Performed at [facility]  Lipid panel     Status: Abnormal   Collection Time: 06/21/15  6:45 AM  Result Value Ref Range   Cholesterol 187 0 - 200 mg/dL   Triglycerides 359 (H) <150 mg/dL   HDL 40 (L) >40 mg/dL   Total CHOL/HDL Ratio 4.7 RATIO   VLDL 72 (H) 0 - 40 mg/dL   LDL Cholesterol 75 0 - 99 mg/dL    Comment:        Total Cholesterol/HDL:CHD Risk Coronary Heart Disease Risk Table                     Men   Women  1/2 Average Risk   3.4   3.3  Average Risk       5.0   4.4  2 X Average Risk   9.6   7.1  3 X Average Risk  23.4   11.0        Use the calculated Patient Ratio above and the CHD Risk Table to determine the patient's CHD Risk.        ATP III CLASSIFICATION (LDL):  <100     mg/dL   Optimal  100-129  mg/dL   Near or Above                    Optimal  130-159  mg/dL   Borderline  160-189  mg/dL   High  >190     mg/dL   Very High Performed at [facility]   Hemoglobin A1c     Status: Abnormal   Collection Time:  06/21/15  6:45 AM  Result Value Ref Range   Hgb A1c MFr Bld 5.7 (H) 4.8 - 5.6 %    Comment: (NOTE)         Pre-diabetes: 5.7 - 6.4         Diabetes: >6.4         Glycemic control for adults with diabetes: <7.0    Mean Plasma Glucose 117 mg/dL    Comment: (NOTE) Performed At: [facility] [place], [place] [id number] Lindon Romp MD [id number] Performed at [facility]   Prolactin     Status: Abnormal   Collection Time: 06/21/15  6:45 AM  Result Value Ref Range   Prolactin 112.5 (H) 4.8 - 23.3 ng/mL    Comment: (NOTE) Performed At: [facility] [address] [place], [place] [id number] Lindon Romp MD [id number] Performed at [facility]   T4, free     Status: None   Collection Time: 06/21/15  6:45 PM  Result Value Ref Range   Free T4 0.66 0.61 - 1.12 ng/dL    Comment: Performed at [facility]  T3     Status: None   Collection Time: 06/21/15  6:45 PM  Result Value Ref Range   T3, Total 93 71 - 180 ng/dL    Comment: (NOTE) Performed At: [facility] [place], [place] [id number] Lindon Romp MD [id number] Performed at [facility]     Physical Findings: AIMS: Facial and Oral Movements Muscles of Facial Expression: None, normal Lips and Perioral Area: None, normal Jaw: None, normal Tongue: None, normal,Extremity Movements Upper (arms, wrists, hands, fingers): None, normal Lower (legs, knees, ankles, toes): None, normal, Trunk Movements Neck, shoulders, hips: None, normal, Overall Severity Severity of abnormal movements (highest score from questions above): None, normal Incapacitation due to abnormal movements: None, normal Patient's awareness of abnormal movements (rate only patient's report): No Awareness, Dental Status Current problems with teeth and/or dentures?: No Does patient usually wear dentures?: No  CIWA:  CIWA-Ar Total: 0 COWS:  COWS Total Score: 2  Musculoskeletal: Strength & Muscle Tone: within normal limits Gait & Station: normal Patient leans: N/A  Psychiatric Specialty Exam: Review of Systems  Constitutional: Positive for malaise/fatigue.  Psychiatric/Behavioral: Positive for depression. The patient is nervous/anxious and has insomnia.   All other systems reviewed and are negative.   Blood pressure 101/68, pulse 100, temperature 97.8 F (36.6 C), temperature source Oral, resp. rate 16, height 4\' 11"  (1.499 m), weight 66.225 kg (146 lb), SpO2 98 %.Body mass index is 29.47 kg/(m^2).  General Appearance: Casual  Eye Contact::  Fair  Speech:  Normal Rate  Volume:  Decreased  Mood:  Anxious and Depressed  Affect:  Depressed  Thought Process:  Coherent  Orientation:  Full (Time, Place, and Person)  Thought Content:  Rumination  Suicidal Thoughts:  Yes.  without intent/plan improving   Homicidal Thoughts:  No  Memory:  Immediate;   Fair Recent;   Fair Remote;   Fair  Judgement:  Fair  Insight:  Fair  Psychomotor Activity:  Decreased  Concentration:  Poor  Recall:  [employer] of Knowledge:Fair  Language: Fair  Akathisia:  No  Handed:  Right  AIMS (if indicated):     Assets:  Desire for Improvement  ADL's:  Intact  Cognition: WNL  Sleep:  Number of Hours: 7   Treatment Plan Summary: Daily contact with patient to assess and  evaluate symptoms and progress in treatment and Medication management  Will continue Lithium 300 mg po bid . Li level reviewed - 0.44 mmol/l ( 1031/16). Will get another [name] level -06/24/15. Pt received Invega Sustenna 156 mg IM recently ( prior to admission)- 06/18/15. Will continue Lunesta to 2 mg po qhs for sleep. Will also make available Vistaril 50 mg po qhs prn for sleep. Increase Gabapentin to 300 mg po bid and 400 mg po qhs for anxiety sx. Will continue Vistaril 25 mg po q6h prn for anxiety sx.  Will continue to monitor vitals ,medication compliance and treatment side effects while patient is here.  Will monitor for medical issues as well as call consult as needed.  Reviewed labs ,TSH slightly elevated - likely secondary to lithium therapy.  T3,T4 done today - noted as unremarkable. Will continue Zocor 20 mg po daily for hyperlipidemia , Dietician consult placed. CSW will start working on disposition. Pt is interested in IOP - CSW will work on this . Patient to participate in therapeutic milieu .    Audra Bellard md 06/22/2015, 2:02 PM

## 2015-06-22 NOTE — [facility] Group Notes (Signed)
[facility] LCSW Group Therapy   06/22/2015 1:29 PM  Type of Therapy: Group Therapy  Participation Level:  Active  Participation Quality:  Attentive  Affect:  Flat  Cognitive:  Oriented  Insight:  Limited  Engagement in Therapy:  Engaged  Modes of Intervention:  Discussion and Socialization  Summary of Progress/Problems: Chaplain was here to lead a group on themes of hope and/or courage.  Pt was alert and oriented throughout the group. "Hope is finding the positive in a situation. I'm going through a situation that is hard for me to remain positive about". Pt spoke about how coming to the hospital can help her think more positively when she's not doing well. "Your mood affects the way that you see things...when your mood is up you think more positively".  Georga Kaufmann 06/22/2015 1:29 PM

## 2015-06-22 NOTE — Progress Notes (Signed)
D Pt. Denies SI and HI, no complaints of pain or discomfort noted at present time.  A Writer offered support and encouragement, discussed pt.'s day.  R Pt. Rates her depression a 5 or 6, and states her anxiety is down to a zero. She does express concern d/t her home going into foreclosure.  Pt. States she thinks her bank will work with her because she will be drawing disability soon.  Pt. Remains safe on the unit.

## 2015-06-22 NOTE — [facility] Group Notes (Signed)
[facility] LCSW Aftercare Discharge Planning Group Note   06/22/2015 12:57 PM  Participation Quality: Invited. Chose not to attend.  [name]

## 2015-06-22 NOTE — Progress Notes (Addendum)
Pts affect is flat and depressed this am.She stated,"I am much better now than I was last time I was here." Pt told the writer she has applied for disability. 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 " Pt remained in the bed this am and requested her medications be brought to her. Pt does contract for safety and denies Si and HI. Pt denies any passive SI this am. 2:45p- Pt stated she is feeling her mouth is having mouth movements and requested cogentin. MD  Made aware.

## 2015-06-23 DIAGNOSIS — F250 Schizoaffective disorder, bipolar type: Principal | ICD-10-CM

## 2015-06-23 NOTE — [facility] Group Notes (Signed)
[place] Group Notes: (Clinical Social Work)   06/23/2015      Type of Therapy:  Group Therapy   Participation Level:  Did Not Attend despite MHT prompting   Selmer Dominion, LCSW 06/23/2015, 12:49 PM

## 2015-06-23 NOTE — Progress Notes (Signed)
Patient ID: [name], female   DOB: [birth year], 54 y.o.   MRN: [id number] [facility] MD Progress Note  06/23/2015 2:27 PM [name]  MRN:  [id number] Subjective:  Patient states depression is a little better. Denies SI/HI and AVH. Today is very fatigued and thinks it is medication SE. Sleep is good but energy is low. States she has been attending groups. States there is an inappropriate guy on the unit who is flirting with her.   Endorsing some constipation.   Objective: [name] is a 54 y.o. Caucasian female who is divorced , unemployed , awaiting SSD (has upcoming court hearing) , lives byself in a condo , has a hx of schizoaffective disorder , presented to [facility] since she was referred by her psychiatrist D[name] due to increasing sx of depression. Pt per initial notes in EHR "Reported SI with plan to OD on Ambien."  Patient seen and chart reviewed. Pt today reports sleep as improved on the Lunesta .  Pt with several psychosocial stressors as well as upcoming foreclosure of her home . She continues to need a lot of support. She is open to intensive therapy on an out patient basis .CSW has been working on referral . Pt with chronic hx of schizoaffective , bipolar disorder. She will continue to need medication readjustment and support.        Principal Problem: Schizoaffective disorder, bipolar type ([place]) Diagnosis:   Patient Active Problem List   Diagnosis Date Noted  . Schizoaffective disorder, bipolar type ([place]) [F25.0] 06/21/2015  . Borderline personality disorder [F60.3] 06/20/2015  . Lithium toxicity [T56.891A] 05/01/2014  . Leukocytosis [D72.829] 05/01/2014  . Costochondritis [M94.0] 02/14/2014  . Renal stone [N20.0] 02/13/2014  . Fatty liver disease, nonalcoholic [H47.4] 25/95/6387  . Abnormal LFTs [R79.89] 04/06/2013  . Dysuria [R30.0] 02/15/2013  . Medication side effect [T88.7XXA] 01/07/2013  . Lumpy breasts [N60.29] 12/20/2012  . Lump of breast, left  [N63] 12/20/2012  . Loss of weight [R63.4] 11/08/2012  . Food aversion [R63.3] 11/08/2012  . Constipation [K59.00] 05/17/2012  . Right ear pain [H92.01] 02/16/2012  . Jaw pain [R68.84] 02/13/2012  . Otitis externa [H60.90] 02/13/2012  . Vitamin D deficiency [268] 09/23/2011  . Loose stools [R19.5] 09/23/2011  . Double vision [H53.2] 03/15/2011  . Hyperglycemia [R73.9] 01/10/2011  . Dyspnea on effort [R06.09] 01/10/2011  . Palpitations [R00.2] 12/08/2010  . ENDOMETRIOSIS [N80.9] 05/11/2008  . DEFICIENCY, VITAMIN D NOS [E55.9] 06/11/2007  . PLANTAR FASCIITIS [M72.2] 06/11/2007  . ALLERGIC RHINITIS [J30.9] 03/26/2007   Total Time spent with patient: 25 minutes  Past Psychiatric History: Pt with hx of mental illness since the past several years . Pt denies suicide attempts. Pt has had multiple hospitalizations - [facility], [facility] . Pt currently follows up with [facility] , has a psychiatrist and therapist there  Past Medical History:  Past Medical History  Diagnosis Date  . Headache(784.0)   . IBS (irritable bowel syndrome)   . Bipolar 1 disorder (HCC)     HX PSYCHOSIS  . Personality disorder     W/ BORDERLINE FEATURES  . History of duodenal ulcer   . Pelvic pain   . Complication of anesthesia     POST AGGITATION  . Frequency of urination   . Urgency of urination   . Nocturia     Past Surgical History  Procedure Laterality Date  . Laparoscopy N/A 12/14/2012    Procedure: LAPAROSCOPY DIAGNOSTIC;  Surgeon: Stark Klein, MD;  Location: [facility];  Service: General;  Laterality: N/A;  . Laparoscopic left salpingoophorectomy and lysys adhesions  03-10-2002  . Cardiovascular stress test  01-23-2011    NORMAL NUCLEAR STUDY/ NO ISCHEMIA/ EF 81%  . Transthoracic echocardiogram  10-13-2010    NORMAL LVF/  EF 55-60%  . Laparoscopic cholecystectomy  11-08-2001  . Cardiac catheterization  10-09-1999  DR KATZ    NORMAL LVF/  NORMAL RCA/  NO CRITICAL DISEASE LEFT CORONARY SYSTEM  .  Laparoscopic removal ovary remnant  2003   [place]  . Total abdominal hysterectomy  2000    W/ RIGHT SALPINGOOPHORECTOMY  . Cystoscopy with biopsy N/A 06/03/2013    Procedure: CYSTOSCOPY WITH BIOPSY  INSTILLATION OF MARCAINE AND PYRIDIUM;  Surgeon: Hanley Ben, MD;  Location: [place];  Service: Urology;  Laterality: N/A;   Family History:  Family History  Problem Relation Age of Onset  . Sleep apnea Father   . Depression Father   . Migraines Sister     headaches  . Anxiety disorder Sister   . Other Mother     MAC infection  . Anxiety disorder Mother    Family Psychiatric  History: Pt reports mother and sister both have anxiety and father - depression.  Social History:  Patient is divorced , unemployed , used to work as a Marine scientist . Pt currently has applied for disability , awaiting SSD hearing. Pt denies having any children. Pt is religious .         History  Alcohol Use  . 1.8 oz/week  . 3 Cans of beer per week    Comment: 1-2 beers a week     History  Drug Use No    Comment: Patient denies drug use    Social History   Social History  . Marital Status: Single    Spouse Name: N/A  . Number of Children: 0  . Years of Education: college   Occupational History  . N/A     Nurse   Social History Main Topics  . Smoking status: Former Smoker -- 0.01 packs/day for 1 years    Types: Cigarettes    Quit date: 08/18/2009  . Smokeless tobacco: Never Used     Comment: ONLY SMOKED FOR 6 MONTHS --  QUIT  YRS AGO  . Alcohol Use: 1.8 oz/week    3 Cans of beer per week     Comment: 1-2 beers a week  . Drug Use: No     Comment: Patient denies drug use  . Sexual Activity: No   Other Topics Concern  . 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    no tobacco   Caffeine Use: very little, three  times a week   Additional Social History:    Pain Medications: see mar Prescriptions: see mar Over the Counter: see mar History of alcohol / drug use?: Yes Longest period of sobriety (when/how long): na                    Sleep: Fair  Appetite:  Poor  Current Medications: Current Facility-Administered Medications  Medication Dose Route Frequency Provider Last Rate Last Dose  . acetaminophen (TYLENOL) tablet 650 mg  650 mg Oral Q6H PRN Patrecia Pour, NP      . alum & mag hydroxide-simeth (MAALOX/MYLANTA)  200-200-20 MG/5ML suspension 30 mL  30 mL Oral Q4H PRN Patrecia Pour, NP      . benztropine (COGENTIN) tablet 0.5 mg  0.5 mg Oral Daily Ursula Alert, MD   0.5 mg at 06/23/15 0838  . docusate sodium (COLACE) capsule 100 mg  100 mg Oral BID Patrecia Pour, NP   100 mg at 06/23/15 0762  . eszopiclone (LUNESTA) tablet 2 mg  2 mg Oral QHS Ursula Alert, MD   2 mg at 06/22/15 2125  . gabapentin (NEURONTIN) capsule 300 mg  300 mg Oral BID Patrecia Pour, NP   300 mg at 06/23/15 0839  . gabapentin (NEURONTIN) capsule 400 mg  400 mg Oral QHS Saramma Eappen, MD   400 mg at 06/22/15 2125  . hydrOXYzine (ATARAX/VISTARIL) tablet 25 mg  25 mg Oral TID PRN Ursula Alert, MD      . hydrOXYzine (ATARAX/VISTARIL) tablet 50 mg  50 mg Oral QHS PRN Ursula Alert, MD   50 mg at 06/21/15 2308  . lithium carbonate (LITHOBID) CR tablet 300 mg  300 mg Oral Q12H Saramma Eappen, MD   300 mg at 06/23/15 0838  . magnesium hydroxide (MILK OF MAGNESIA) suspension 30 mL  30 mL Oral Daily PRN Patrecia Pour, NP   30 mL at 06/23/15 1256  . multivitamin with minerals tablet 1 tablet  1 tablet Oral Daily Patrecia Pour, NP   1 tablet at 06/23/15 [phone]  . pantoprazole (PROTONIX) EC tablet 20 mg  20 mg Oral Daily Patrecia Pour, NP   20 mg at 06/23/15 0839  . simvastatin (ZOCOR) tablet 20 mg  20 mg Oral q1800 Ursula Alert, MD   20 mg at 06/21/15 2015    Lab Results:  Results for orders placed or performed  during the hospital encounter of 06/19/15 (from the past 48 hour(s))  T4, free     Status: None   Collection Time: 06/21/15  6:45 PM  Result Value Ref Range   Free T4 0.66 0.61 - 1.12 ng/dL    Comment: Performed at [facility]  T3     Status: None   Collection Time: 06/21/15  6:45 PM  Result Value Ref Range   T3, Total 93 71 - 180 ng/dL    Comment: (NOTE) Performed At: [facility] [place], [place] [id number] Lindon Romp MD [id number] Performed at [facility]     Physical Findings: AIMS: Facial and Oral Movements Muscles of Facial Expression: None, normal Lips and Perioral Area: None, normal Jaw: None, normal Tongue: Minimal,Extremity Movements Upper (arms, wrists, hands, fingers): None, normal Lower (legs, knees, ankles, toes): None, normal, Trunk Movements Neck, shoulders, hips: None, normal, Overall Severity Severity of abnormal movements (highest score from questions above): None, normal Incapacitation due to abnormal movements: None, normal Patient's awareness of abnormal movements (rate only patient's report): No Awareness, Dental Status Current problems with teeth and/or dentures?: No Does patient usually wear dentures?: No  CIWA:  CIWA-Ar Total: 0 COWS:  COWS Total Score: 2  Musculoskeletal: Strength & Muscle Tone: within normal limits Gait & Station: normal Patient leans: N/A  Psychiatric Specialty Exam: Review of Systems  Constitutional: Positive for malaise/fatigue.  Gastrointestinal: Positive for constipation.  Psychiatric/Behavioral: Positive for depression. Negative for suicidal ideas and hallucinations. The patient is nervous/anxious and has insomnia.   All other systems reviewed and are negative.   Blood pressure 97/82, pulse 111, temperature 98 F (36.7 C), temperature source Oral,  resp. rate 16, height 4\' 11"  (1.499 m), weight 66.225 kg (146 lb), SpO2 98 %.Body mass index is 29.47 kg/(m^2).   General Appearance: Casual  Eye Contact::  Fair  Speech:  Normal Rate  Volume:  Normal  Mood:  Depressed  Affect:  Depressed  Thought Process:  Coherent  Orientation:  Full (Time, Place, and Person)  Thought Content:  Paranoid Ideation  Suicidal Thoughts:  No improving  Homicidal Thoughts:  No  Memory:  Immediate;   Fair Recent;   Fair Remote;   Fair  Judgement:  Fair  Insight:  Fair  Psychomotor Activity:  Decreased  Concentration:  Poor  Recall:  [employer] of Knowledge:Fair  Language: Fair  Akathisia:  No  Handed:  Right  AIMS (if indicated):     Assets:  Desire for Improvement  ADL's:  Intact  Cognition: WNL  Sleep:  Number of Hours: 7   Treatment Plan Summary: Daily contact with patient to assess and evaluate symptoms and progress in treatment and Medication management  Will continue Lithium 300 mg po bid . Li level reviewed - 0.44 mmol/l ( 1031/16). Will get another [name] level -06/24/15. Pt received Invega Sustenna 156 mg IM recently ( prior to admission)- 06/18/15. Will decrease Lunesta to 1 mg po qhs for sleep due to fatigue Will also make available Vistaril 50 mg po qhs prn for sleep. Increase Gabapentin to 300 mg po bid and 400 mg po qhs for anxiety sx. Will continue Vistaril 25 mg po q6h prn for anxiety sx. - reports Tachycardia. Reviewed EKG and it showed sinus tachycardia otherwise normal EKG  Will continue to monitor vitals ,medication compliance and treatment side effects while patient is here.   Will monitor for medical issues as well as call consult as needed.   Reviewed labs ,TSH slightly elevated - likely secondary to lithium therapy.  T3,T4 done today - noted as unremarkable.  Will continue Zocor 20 mg po daily for hyperlipidemia , Dietician consult placed.  CSW will start working on disposition. Pt is interested in IOP - CSW will work on this .  Patient to participate in therapeutic milieu .    Asanti Craigo md 06/23/2015, 2:27 PM

## 2015-06-23 NOTE — [facility] Group Notes (Signed)
[place] Group Notes:  (Nursing/MHT/Case Management/Adjunct)  Date:  06/23/2015  Time:  12:38 PM Type of Therapy:  Psychoeducational Skills  Participation Level:  Active  Participation Quality:  Appropriate  Affect:  Appropriate  Cognitive:  Appropriate  Insight:  Appropriate  Engagement in Group:  Engaged  Modes of Intervention:  Problem-solving  Summary of Progress/Problems: Pt attended healthy coping skills.     Wolfgang Phoenix 06/23/2015, 12:38 PM

## 2015-06-23 NOTE — Progress Notes (Signed)
D: [name] states today was "bad". She states it was bad because she slept too long and she believes she is taking too much lunesta and states per her conversation with provider today, she will be taking a decreased dosage tonight to prevent her from being so sleepy during the day. She states she did not wake up until after 10 am this morning. She rates Depression 7-8/10 and Anxiety 4/10. She has been involved in group and has not been isolating today.  A: Encouragement and support given.  R: Continue to monitor for patient safety and medication effectiveness.

## 2015-06-23 NOTE — Progress Notes (Signed)
Pt currently presents with a bright affect and jovial behavior. Per self inventory, pt rates depression at a 5, hopelessness 3 and anxiety 3. Pt's daily goal is to "not sleep during the day" and they intend to do so by "staying out of the of the bed." Pt reports a good sleep, a good appetite, low energy and good concentration. Pt reported being negative SI/HI, no AH/VH noted. A: 15 min checks continued for patient safety. R: Pts safety maintained.

## 2015-06-24 LAB — LITHIUM LEVEL: Lithium Lvl: 0.38 mmol/L — ABNORMAL LOW (ref 0.60–1.20)

## 2015-06-24 MED ORDER — ESZOPICLONE 2 MG PO TABS
1.0000 mg | ORAL_TABLET | Freq: Every day | ORAL | Status: DC
  Administered 2015-06-24 – 2015-06-25 (×2): 1 mg via ORAL
  Filled 2015-06-24 (×2): qty 1

## 2015-06-24 MED ORDER — LITHIUM CARBONATE ER 300 MG PO TBCR
600.0000 mg | EXTENDED_RELEASE_TABLET | Freq: Every evening | ORAL | Status: DC
  Administered 2015-06-24 – 2015-06-26 (×3): 600 mg via ORAL
  Filled 2015-06-24 (×5): qty 2

## 2015-06-24 MED ORDER — LITHIUM CARBONATE ER 300 MG PO TBCR
300.0000 mg | EXTENDED_RELEASE_TABLET | ORAL | Status: DC
  Administered 2015-06-25 – 2015-06-27 (×3): 300 mg via ORAL
  Filled 2015-06-24 (×8): qty 1

## 2015-06-24 NOTE — Plan of Care (Signed)
Problem: Diagnosis: Increased Risk For Suicide Attempt Goal: LTG-Patient Will Show Positive Response to Medication LTG (by discharge) : Patient will show positive response to medication and will participate in the development of the discharge plan.  Outcome: Progressing [name] reports that she is feeling better and denies any SI/HI.

## 2015-06-24 NOTE — Progress Notes (Signed)
[place] Group Notes:  (Nursing/MHT/Case Management/Adjunct)  Date:  06/24/2015  Time:  10:43 PM  Type of Therapy:  Psychoeducational Skills  Participation Level:  Active  Participation Quality:  Appropriate  Affect:  Appropriate  Cognitive:  Appropriate  Insight:  Appropriate  Engagement in Group:  Engaged  Modes of Intervention:  Education  Summary of Progress/Problems: The patient shared in group that she had a good day overall since she enjoyed the music therapy group and because she was able to go outside. As a theme for the day, her support system will be comprised of her mother, sisters, and friends.   Archie Balboa S 06/24/2015, 10:43 PM

## 2015-06-24 NOTE — [facility] Group Notes (Signed)
[place] Group Notes:  (Clinical Social Work)  06/24/2015  [place] Group Notes:  (Clinical Social Work)  06/24/2015  11:00AM-12:00PM  Summary of Progress/Problems:  The main focus of today's process group was to listen to a variety of genres of music and to identify that different types of music provoke different responses.  The patient then was able to identify personally what was soothing for them, as well as energizing.   The patient expressed understanding of concepts, as well as knowledge of how each type of music affected her and how this can be used at home as a wellness/recovery tool.  She stated her anxiety at the beginning of group was a 6/10 and was coming down as group progressed.  She was then pulled out to see a practitioner.  Type of Therapy:  Music Therapy   Participation Level:  Active  Participation Quality:  Attentive and Sharing  Affect:  Blunted  Cognitive:  Oriented  Insight:  Engaged  Engagement in Therapy:  Engaged  Modes of Intervention:   Activity, Exploration  Selmer Dominion, LCSW 06/24/2015

## 2015-06-24 NOTE — Progress Notes (Signed)
Patient ID: [name], female   DOB: 1961-06-28, 54 y.o.   MRN: [id number] Patient ID: [name], female   DOB: 23-Aug-1960, 54 y.o.   MRN: [id number] [facility] MD Progress Note  06/24/2015 11:24 AM [name]  MRN:  [id number] Subjective:  Patient states depression is a little better. Denies HI and AVH. Reports daily thoughts of SI with plan to OD and denies intent. Today is very fatigued and thinks it is medication SE. Sleep was good with Lunesta 1mg  but energy is low. States she has been attending groups. Pt has been feeling very anxious about her housing and is worried about becoming homeless.   Endorsing some constipation but MOM is helping.   Objective: [name] is a 54 y.o. Caucasian female who is divorced , unemployed , awaiting SSD (has upcoming court hearing) , lives byself in a condo , has a hx of schizoaffective disorder , presented to [facility] since she was referred by her psychiatrist Dt. [name] due to increasing sx of depression. Pt per initial notes in EHR "Reported SI with plan to OD on Ambien."  Patient seen and chart reviewed. Pt today reports sleep as improved on the Lunesta .  Pt with several psychosocial stressors as well as upcoming foreclosure of her home . She continues to need a lot of support. She is open to intensive therapy on an out patient basis .CSW has been working on referral . Pt with chronic hx of schizoaffective , bipolar disorder. She will continue to need medication readjustment and support. Pt has been attending most groups but states she doesn't sit in the dayroom because she doesn't like the show on tv.         Principal Problem: Schizoaffective disorder, bipolar type ([place]) Diagnosis:   Patient Active Problem List   Diagnosis Date Noted  . Schizoaffective disorder, bipolar type ([place]) [F25.0] 06/21/2015  . Borderline personality disorder [F60.3] 06/20/2015  . Lithium toxicity [T56.891A] 05/01/2014  . Leukocytosis [D72.829]  05/01/2014  . Costochondritis [M94.0] 02/14/2014  . Renal stone [N20.0] 02/13/2014  . Fatty liver disease, nonalcoholic [W58.0] 99/83/3825  . Abnormal LFTs [R79.89] 04/06/2013  . Dysuria [R30.0] 02/15/2013  . Medication side effect [T88.7XXA] 01/07/2013  . Lumpy breasts [N60.29] 12/20/2012  . Lump of breast, left [N63] 12/20/2012  . Loss of weight [R63.4] 11/08/2012  . Food aversion [R63.3] 11/08/2012  . Constipation [K59.00] 05/17/2012  . Right ear pain [H92.01] 02/16/2012  . Jaw pain [R68.84] 02/13/2012  . Otitis externa [H60.90] 02/13/2012  . Vitamin D deficiency [268] 09/23/2011  . [name] stools [R19.5] 09/23/2011  . Double vision [H53.2] 03/15/2011  . Hyperglycemia [R73.9] 01/10/2011  . Dyspnea on effort [R06.09] 01/10/2011  . Palpitations [R00.2] 12/08/2010  . ENDOMETRIOSIS [N80.9] 05/11/2008  . DEFICIENCY, VITAMIN D NOS [E55.9] 06/11/2007  . PLANTAR FASCIITIS [M72.2] 06/11/2007  . ALLERGIC RHINITIS [J30.9] 03/26/2007   Total Time spent with patient: 25 minutes  Past Psychiatric History: Pt with hx of mental illness since the past several years . Pt denies suicide attempts. Pt has had multiple hospitalizations - [facility], [facility] [place] . Pt currently follows up with [facility] , has a psychiatrist and therapist there  Past Medical History:  Past Medical History  Diagnosis Date  . Headache(784.0)   . IBS (irritable bowel syndrome)   . Bipolar 1 disorder (HCC)     HX PSYCHOSIS  . Personality disorder     W/ BORDERLINE FEATURES  . History of duodenal ulcer   .  Pelvic pain   . Complication of anesthesia     POST AGGITATION  . Frequency of urination   . Urgency of urination   . Nocturia     Past Surgical History  Procedure Laterality Date  . Laparoscopy N/A 12/14/2012    Procedure: LAPAROSCOPY DIAGNOSTIC;  Surgeon: Stark Klein, MD;  Location: [facility];  Service: General;  Laterality: N/A;  . Laparoscopic left salpingoophorectomy and lysys adhesions  03-10-2002  .  Cardiovascular stress test  01-23-2011    NORMAL NUCLEAR STUDY/ NO ISCHEMIA/ EF 81%  . Transthoracic echocardiogram  10-13-2010    NORMAL LVF/  EF 55-60%  . Laparoscopic cholecystectomy  11-08-2001  . Cardiac catheterization  10-09-1999  DR KATZ    NORMAL LVF/  NORMAL RCA/  NO CRITICAL DISEASE LEFT CORONARY SYSTEM  . Laparoscopic removal ovary remnant  2003   [place]  . Total abdominal hysterectomy  2000    W/ RIGHT SALPINGOOPHORECTOMY  . Cystoscopy with biopsy N/A 06/03/2013    Procedure: CYSTOSCOPY WITH BIOPSY  INSTILLATION OF MARCAINE AND PYRIDIUM;  Surgeon: Hanley Ben, MD;  Location: [place];  Service: Urology;  Laterality: N/A;   Family History:  Family History  Problem Relation Age of Onset  . Sleep apnea Father   . Depression Father   . Migraines Sister     headaches  . Anxiety disorder Sister   . Other Mother     MAC infection  . Anxiety disorder Mother    Family Psychiatric  History: Pt reports mother and sister both have anxiety and father - depression.  Social History:  Patient is divorced , unemployed , used to work as a Marine scientist . Pt currently has applied for disability , awaiting SSD hearing. Pt denies having any children. Pt is religious .         History  Alcohol Use  . 1.8 oz/week  . 3 Cans of beer per week    Comment: 1-2 beers a week     History  Drug Use No    Comment: Patient denies drug use    Social History   Social History  . Marital Status: Single    Spouse Name: N/A  . Number of Children: 0  . Years of Education: college   Occupational History  . N/A     Nurse   Social History Main Topics  . Smoking status: Former Smoker -- 0.01 packs/day for 1 years    Types: Cigarettes    Quit date: 08/18/2009  . Smokeless tobacco: Never Used     Comment: ONLY SMOKED FOR 6 MONTHS --  QUIT  YRS AGO  . Alcohol Use: 1.8 oz/week    3 Cans of beer per week     Comment: 1-2 beers a week  . Drug Use: No     Comment:  Patient denies drug use  . Sexual Activity: No   Other Topics Concern  . 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    no tobacco   Caffeine Use: very little, three times a week   Additional Social History:    Pain Medications: see mar Prescriptions: see mar Over the Counter: see mar History of alcohol / drug use?: Yes Longest period of sobriety (when/how long): na  Sleep: Fair  Appetite:  Poor  Current Medications: Current Facility-Administered Medications  Medication Dose Route Frequency Provider Last Rate Last Dose  . acetaminophen (TYLENOL) tablet 650 mg  650 mg Oral Q6H PRN Patrecia Pour, NP      . alum & mag hydroxide-simeth (MAALOX/MYLANTA) 200-200-20 MG/5ML suspension 30 mL  30 mL Oral Q4H PRN Patrecia Pour, NP      . benztropine (COGENTIN) tablet 0.5 mg  0.5 mg Oral Daily Ursula Alert, MD   0.5 mg at 06/24/15 0807  . docusate sodium (COLACE) capsule 100 mg  100 mg Oral BID Patrecia Pour, NP   100 mg at 06/24/15 1478  . eszopiclone (LUNESTA) tablet 2 mg  2 mg Oral QHS Ursula Alert, MD   2 mg at 06/22/15 2125  . gabapentin (NEURONTIN) capsule 300 mg  300 mg Oral BID Patrecia Pour, NP   300 mg at 06/24/15 2956  . gabapentin (NEURONTIN) capsule 400 mg  400 mg Oral QHS Ursula Alert, MD   400 mg at 06/23/15 2214  . hydrOXYzine (ATARAX/VISTARIL) tablet 25 mg  25 mg Oral TID PRN Ursula Alert, MD      . hydrOXYzine (ATARAX/VISTARIL) tablet 50 mg  50 mg Oral QHS PRN Ursula Alert, MD   50 mg at 06/21/15 2308  . lithium carbonate (LITHOBID) CR tablet 300 mg  300 mg Oral Q12H Saramma Eappen, MD   300 mg at 06/24/15 0807  . magnesium hydroxide (MILK OF MAGNESIA) suspension 30 mL  30 mL Oral Daily PRN Patrecia Pour, NP   30 mL at 06/23/15 1256  . multivitamin with minerals tablet 1 tablet   1 tablet Oral Daily Patrecia Pour, NP   1 tablet at 06/24/15 2130  . pantoprazole (PROTONIX) EC tablet 20 mg  20 mg Oral Daily Patrecia Pour, NP   20 mg at 06/24/15 0807  . simvastatin (ZOCOR) tablet 20 mg  20 mg Oral q1800 Ursula Alert, MD   20 mg at 06/23/15 1719    Lab Results:  Results for orders placed or performed during the hospital encounter of 06/19/15 (from the past 48 hour(s))  Lithium level     Status: Abnormal   Collection Time: 06/24/15  6:09 AM  Result Value Ref Range   Lithium Lvl 0.38 (L) 0.60 - 1.20 mmol/L    Comment: Performed at [facility]    Physical Findings: AIMS: Facial and Oral Movements Muscles of Facial Expression: None, normal Lips and Perioral Area: None, normal Jaw: None, normal Tongue: None, normal,Extremity Movements Upper (arms, wrists, hands, fingers): None, normal Lower (legs, knees, ankles, toes): None, normal, Trunk Movements Neck, shoulders, hips: None, normal, Overall Severity Severity of abnormal movements (highest score from questions above): None, normal Incapacitation due to abnormal movements: None, normal Patient's awareness of abnormal movements (rate only patient's report): No Awareness, Dental Status Current problems with teeth and/or dentures?: No Does patient usually wear dentures?: No  CIWA:  CIWA-Ar Total: 2 COWS:  COWS Total Score: 2  Musculoskeletal: Strength & Muscle Tone: within normal limits Gait & Station: normal Patient leans: N/A  Psychiatric Specialty Exam: Review of Systems  Constitutional: Positive for malaise/fatigue.  Gastrointestinal: Positive for constipation.  Psychiatric/Behavioral: Positive for depression and suicidal ideas. Negative for hallucinations. The patient is nervous/anxious. The patient does not have insomnia.   All other systems reviewed and are negative.   Blood pressure 110/66, pulse 117, temperature 97.6 F (36.4 C), temperature source Oral, resp. rate  16, height 4'  11" (1.499 m), weight 66.225 kg (146 lb), SpO2 98 %.Body mass index is 29.47 kg/(m^2).  General Appearance: Casual  Eye Contact::  Fair  Speech:  Normal Rate  Volume:  Normal  Mood:  Depressed  Affect:  Depressed  Thought Process:  Coherent  Orientation:  Full (Time, Place, and Person)  Thought Content:  Paranoid Ideation-improved  Suicidal Thoughts:  Yes with plan but denies intent  Homicidal Thoughts:  No  Memory:  Immediate;   Fair Recent;   Fair Remote;   Fair  Judgement:  Fair  Insight:  Fair  Psychomotor Activity:  Decreased  Concentration:  Poor  Recall:  [employer] of Knowledge:Fair  Language: Fair  Akathisia:  No  Handed:  Right  AIMS (if indicated):     Assets:  Desire for Improvement  ADL's:  Intact  Cognition: WNL  Sleep:  Number of Hours: 6.75   Treatment Plan Summary: Daily contact with patient to assess and evaluate symptoms and progress in treatment and Medication management  Will increase Lithium 300qAM and 600mg  at bedtime. Li level reviewed - 0.44 mmol/l ( 1031/16).  Li level -06/24/15 is 0.38 Pt received Invega Sustenna 156 mg IM recently ( prior to admission)- 06/18/15. Will Lunesta to 1 mg po qhs for sleep due to fatigue Will also make available Vistaril 50 mg po qhs prn for sleep. Gabapentin to 300 mg po bid and 400 mg po qhs for anxiety sx. Will continue Vistaril 25 mg po q6h prn for anxiety sx. - reports Tachycardia. Reviewed EKG and it showed sinus tachycardia otherwise normal EKG  Will continue to monitor vitals ,medication compliance and treatment side effects while patient is here.   Will monitor for medical issues as well as call consult as needed.   Reviewed labs ,TSH slightly elevated - likely secondary to lithium therapy.  T3,T4 done today - noted as unremarkable.  Will continue Zocor 20 mg po daily for hyperlipidemia , Dietician consult placed.  CSW will start working on disposition. Pt is interested in IOP - CSW will work on this  .  Patient to participate in therapeutic milieu .    Shateka Petrea md 06/24/2015, 11:24 AM

## 2015-06-24 NOTE — BHH Group Notes (Signed)
[place] Group Notes:  (Nursing/MHT/Case Management/Adjunct)  Date:  06/24/2015  Time:  10:33 AM  Type of Therapy:  Psychoeducational Skills  Participation Level:  Did Not Attend  Participation Quality:  N/A  Affect:  N/A  Cognitive:  N/A  Insight: N/A  Engagement in Group:  None  Modes of Intervention:  N/A  Summary of Progress/Problems: Patient was invited to group but did not attend.

## 2015-06-24 NOTE — Progress Notes (Signed)
DAR Note: [name] has been visible on the unit.  She didn't attending morning group with the nurses but she did get up and attend music therapy group.  She reports that she is feeling better.  "I slept ok last night, it took me awhile to fall asleep but when I got to sleep I slept good."  She denies any SI/HI or A/V hallucinations.  She denies any physical problems and appears to be in no physical distress.  She completed her self inventory and reports that her depression is 5/10, hopelessness 3/10 and anxiety 3/10.  She states that her goal for today is "not to sleep much during the day."  She is currently sitting in the day room watching football.  Q 15 minute checks maintained for safety.  We will continue to monitor the progress towards her goals.

## 2015-06-24 NOTE — Progress Notes (Signed)
Pt reports she is doing well and her medications are helping.  She did have a change to her Lithium order which was discussed with her.  She says she is worried about losing her house and hopes she can get things worked out once she is discharged.  She states her family is supportive.  She denies SI/HI/AVH at this time.  She is pleasant and cooperative with staff.  Pt makes her needs known to staff.  She plans to return home at discharge.  Support and encouragement offered.  Safety maintained with q15 minute checks.

## 2015-06-25 NOTE — BHH Suicide Risk Assessment (Signed)
[place] INPATIENT:  Family/Significant Other Suicide Prevention Education  Suicide Prevention Education:  Education Completed; Dutch Quint, sister, [phone] has been identified by the patient as the family member/significant other with whom the patient will be residing, and identified as the person(s) who will aid the patient in the event of a mental health crisis (suicidal ideations/suicide attempt).  With written consent from the patient, the family member/significant other has been provided the following suicide prevention education, prior to the and/or following the discharge of the patient.  The suicide prevention education provided includes the following:  Suicide risk factors  Suicide prevention and interventions  National Suicide Hotline telephone number  [facility] assessment telephone number  [facility] Emergency Assistance [place] and/or Residential Mobile Crisis Unit telephone number  Request made of family/significant other to:  Remove weapons (e.g., guns, rifles, knives), all items previously/currently identified as safety concern.    Remove drugs/medications (over-the-counter, prescriptions, illicit drugs), all items previously/currently identified as a safety concern.  The family member/significant other verbalizes understanding of the suicide prevention education information provided.  The family member/significant other agrees to remove the items of safety concern listed above.  [name] 06/25/2015, 11:30 AM

## 2015-06-25 NOTE — BHH Group Notes (Signed)
[place] Group Notes:  (Counselor/Nursing/MHT/Case Management/Adjunct)  06/25/2015 1:15PM  Type of Therapy:  Group Therapy  Participation Level:  Active  Participation Quality:  Appropriate  Affect:  Flat  Cognitive:  Oriented  Insight:  Improving  Engagement in Group:  Limited  Engagement in Therapy:  Limited  Modes of Intervention:  Discussion, Exploration and Socialization  Summary of Progress/Problems: The topic for group was balance in life.  Pt participated in the discussion about when their life was in balance and out of balance and how this feels.  Pt discussed ways to get back in balance and short term goals they can work on to get where they want to be. Stayed the entire time.  Did not contribute spontaneously, but responded when invited specifically.  With encouragement, was able to say she is feeling better than when she came in, specifically because she is no longer have thoughts of SI.  Is thinking a lot about her living situation due to threat of foreclosure, and is stressed about her upcoming disability hearing in January.  Was able to decrease her anxiety by problem solving about his, and she agreed to sign a release for her attorney so they can access her records here.  Identified a couple of neighbors as supportive.   [name] 06/25/2015 2:58 PM

## 2015-06-25 NOTE — Progress Notes (Signed)
Patient ID: [name], female   DOB: [birth year], 54 y.o.   MRN: [id number] [facility] MD Progress Note  06/25/2015 4:04 PM DICKIE LABARRE  MRN:  [id number] Subjective:   Patient reports she is feeling partially improved compared to admission . She continues to ruminate and worry about psychosocial stressors, mainly financial  Constraints that could lead for her to lose the apartment she lives in, and upcoming disability hearing in the near future to determine whether she gets disability. At this time denies medication side effects.   Objective: Case discussed with staff and patient seen. Patient reporting partial improvement compared to admission. Still depressed and ruminative about stressors. Denies suicidal ideations. CSW / staff report partial improvement , less severe depression , being more visible in day room, more active in milieu. At this time patient not endorsing significant medication side  Effects. No disruptive or agitated behaviors on unit . Lithium level 11/6 sub - therapeutic- 0. 38  Principal Problem: Schizoaffective disorder, bipolar type ([place]) Diagnosis:   Patient Active Problem List   Diagnosis Date Noted  . Schizoaffective disorder, bipolar type ([place]) [F25.0] 06/21/2015  . Borderline personality disorder [F60.3] 06/20/2015  . Lithium toxicity [T56.891A] 05/01/2014  . Leukocytosis [D72.829] 05/01/2014  . Costochondritis [M94.0] 02/14/2014  . Renal stone [N20.0] 02/13/2014  . Fatty liver disease, nonalcoholic [J62.8] 36/62/9476  . Abnormal LFTs [R79.89] 04/06/2013  . Dysuria [R30.0] 02/15/2013  . Medication side effect [T88.7XXA] 01/07/2013  . Lumpy breasts [N60.29] 12/20/2012  . Lump of breast, left [N63] 12/20/2012  . Loss of weight [R63.4] 11/08/2012  . Food aversion [R63.3] 11/08/2012  . Constipation [K59.00] 05/17/2012  . Right ear pain [H92.01] 02/16/2012  . Jaw pain [R68.84] 02/13/2012  . Otitis externa [H60.90] 02/13/2012  . Vitamin D deficiency [268]  09/23/2011  . Loose stools [R19.5] 09/23/2011  . Double vision [H53.2] 03/15/2011  . Hyperglycemia [R73.9] 01/10/2011  . Dyspnea on effort [R06.09] 01/10/2011  . Palpitations [R00.2] 12/08/2010  . ENDOMETRIOSIS [N80.9] 05/11/2008  . DEFICIENCY, VITAMIN D NOS [E55.9] 06/11/2007  . PLANTAR FASCIITIS [M72.2] 06/11/2007  . ALLERGIC RHINITIS [J30.9] 03/26/2007   Total Time spent with patient:  20 minutes   Past Psychiatric History: Pt with hx of mental illness since the past several years . Pt denies suicide attempts. Pt has had multiple hospitalizations - [facility], [facility] [place] . Pt currently follows up with [facility] , has a psychiatrist and therapist there  Past Medical History:  Past Medical History  Diagnosis Date  . Headache(784.0)   . IBS (irritable bowel syndrome)   . Bipolar 1 disorder (HCC)     HX PSYCHOSIS  . Personality disorder     W/ BORDERLINE FEATURES  . History of duodenal ulcer   . Pelvic pain   . Complication of anesthesia     POST AGGITATION  . Frequency of urination   . Urgency of urination   . Nocturia     Past Surgical History  Procedure Laterality Date  . Laparoscopy N/A 12/14/2012    Procedure: LAPAROSCOPY DIAGNOSTIC;  Surgeon: Stark Klein, MD;  Location: [facility];  Service: General;  Laterality: N/A;  . Laparoscopic left salpingoophorectomy and lysys adhesions  03-10-2002  . Cardiovascular stress test  01-23-2011    NORMAL NUCLEAR STUDY/ NO ISCHEMIA/ EF 81%  . Transthoracic echocardiogram  10-13-2010    NORMAL LVF/  EF 55-60%  . Laparoscopic cholecystectomy  11-08-2001  . Cardiac catheterization  10-09-1999  DR KATZ    NORMAL LVF/  NORMAL  RCA/  NO CRITICAL DISEASE LEFT CORONARY SYSTEM  . Laparoscopic removal ovary remnant  2003   [place]  . Total abdominal hysterectomy  2000    W/ RIGHT SALPINGOOPHORECTOMY  . Cystoscopy with biopsy N/A 06/03/2013    Procedure: CYSTOSCOPY WITH BIOPSY  INSTILLATION OF MARCAINE AND PYRIDIUM;  Surgeon: Hanley Ben, MD;  Location: [place];  Service: Urology;  Laterality: N/A;   Family History:  Family History  Problem Relation Age of Onset  . Sleep apnea Father   . Depression Father   . Migraines Sister     headaches  . Anxiety disorder Sister   . Other Mother     MAC infection  . Anxiety disorder Mother    Family Psychiatric  History: Pt reports mother and sister both have anxiety and father - depression.  Social History:  Patient is divorced , unemployed , used to work as a Marine scientist . Pt currently has applied for disability , awaiting SSD hearing. Pt denies having any children. Pt is religious .         History  Alcohol Use  . 1.8 oz/week  . 3 Cans of beer per week    Comment: 1-2 beers a week     History  Drug Use No    Comment: Patient denies drug use    Social History   Social History  . Marital Status: Single    Spouse Name: N/A  . Number of Children: 0  . Years of Education: college   Occupational History  . N/A     Nurse   Social History Main Topics  . Smoking status: Former Smoker -- 0.01 packs/day for 1 years    Types: Cigarettes    Quit date: 08/18/2009  . Smokeless tobacco: Never Used     Comment: ONLY SMOKED FOR 6 MONTHS --  QUIT  YRS AGO  . Alcohol Use: 1.8 oz/week    3 Cans of beer per week     Comment: 1-2 beers a week  . Drug Use: No     Comment: Patient denies drug use  . Sexual Activity: No   Other Topics Concern  . 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    no tobacco   Caffeine Use: very little, three times a week   Additional Social History:    Pain Medications: see mar Prescriptions: see mar Over the Counter: see mar History of alcohol / drug use?: Yes Longest period of sobriety (when/how long): na  Sleep:  Improved   Appetite:   Improved   Current  Medications: Current Facility-Administered Medications  Medication Dose Route Frequency Provider Last Rate Last Dose  . acetaminophen (TYLENOL) tablet 650 mg  650 mg Oral Q6H PRN Patrecia Pour, NP      . alum & mag hydroxide-simeth (MAALOX/MYLANTA) 200-200-20 MG/5ML suspension 30 mL  30 mL Oral Q4H PRN Patrecia Pour, NP      . benztropine (COGENTIN) tablet 0.5 mg  0.5 mg Oral Daily Ursula Alert, MD   0.5 mg at 06/25/15 0748  . docusate sodium (COLACE) capsule 100 mg  100 mg Oral BID Patrecia Pour, NP   100 mg at 06/25/15 0748  . eszopiclone (LUNESTA) tablet 1 mg  1 mg Oral QHS Charlcie Cradle, MD   1 mg  at 06/24/15 2136  . gabapentin (NEURONTIN) capsule 300 mg  300 mg Oral BID Patrecia Pour, NP   300 mg at 06/25/15 0748  . gabapentin (NEURONTIN) capsule 400 mg  400 mg Oral QHS Saramma Eappen, MD   400 mg at 06/24/15 2137  . hydrOXYzine (ATARAX/VISTARIL) tablet 25 mg  25 mg Oral TID PRN Ursula Alert, MD      . hydrOXYzine (ATARAX/VISTARIL) tablet 50 mg  50 mg Oral QHS PRN Ursula Alert, MD   50 mg at 06/21/15 2308  . lithium carbonate (LITHOBID) CR tablet 300 mg  300 mg Oral Fortunato Curling, MD   300 mg at 06/25/15 7412  . lithium carbonate (LITHOBID) CR tablet 600 mg  600 mg Oral QPM Charlcie Cradle, MD   600 mg at 06/24/15 1823  . magnesium hydroxide (MILK OF MAGNESIA) suspension 30 mL  30 mL Oral Daily PRN Patrecia Pour, NP   30 mL at 06/24/15 2142  . multivitamin with minerals tablet 1 tablet  1 tablet Oral Daily Patrecia Pour, NP   1 tablet at 06/25/15 0748  . pantoprazole (PROTONIX) EC tablet 20 mg  20 mg Oral Daily Patrecia Pour, NP   20 mg at 06/25/15 0747  . simvastatin (ZOCOR) tablet 20 mg  20 mg Oral q1800 Ursula Alert, MD   20 mg at 06/24/15 1823    Lab Results:  Results for orders placed or performed during the hospital encounter of 06/19/15 (from the past 48 hour(s))  Lithium level     Status: Abnormal   Collection Time: 06/24/15  6:09 AM  Result Value Ref Range    Lithium Lvl 0.38 (L) 0.60 - 1.20 mmol/L    Comment: Performed at [facility]    Physical Findings: AIMS: Facial and Oral Movements Muscles of Facial Expression: None, normal Lips and Perioral Area: None, normal Jaw: None, normal Tongue: None, normal,Extremity Movements Upper (arms, wrists, hands, fingers): None, normal Lower (legs, knees, ankles, toes): None, normal, Trunk Movements Neck, shoulders, hips: None, normal, Overall Severity Severity of abnormal movements (highest score from questions above): None, normal Incapacitation due to abnormal movements: None, normal Patient's awareness of abnormal movements (rate only patient's report): No Awareness, Dental Status Current problems with teeth and/or dentures?: No Does patient usually wear dentures?: No  CIWA:  CIWA-Ar Total: 2 COWS:  COWS Total Score: 2  Musculoskeletal: Strength & Muscle Tone: within normal limits Gait & Station: normal Patient leans: N/A  Psychiatric Specialty Exam: Review of Systems  Constitutional: Positive for malaise/fatigue.  Gastrointestinal: Positive for constipation.  Psychiatric/Behavioral: Positive for depression and suicidal ideas. Negative for hallucinations. The patient is nervous/anxious. The patient does not have insomnia.   All other systems reviewed and are negative.   Blood pressure 106/75, pulse 110, temperature 97 F (36.1 C), temperature source Oral, resp. rate 16, height 4\' 11"  (1.499 m), weight 146 lb (66.225 kg), SpO2 98 %.Body mass index is 29.47 kg/(m^2).  General Appearance: Fairly Groomed  Engineer, water::  Fair  Speech:  Normal Rate  Volume:  Normal  Mood:   Depressed but states she is feeling better, improving mood   Affect:   Mildly constricted, but does smile briefly at times   Thought Process:   Linear   Orientation:  Full (Time, Place, and Person)  Thought Content:   Does not endorse hallucinations and does not appear internally preoccupied    Suicidal Thoughts:  Denies plan or intention of hurting self or of  SI at this time. Able to contract for safety at present time .  Homicidal Thoughts:  No  Memory - recent and remote grossly intact   Judgement:  Fair  Insight:  Fair  Psychomotor Activity:  Improving, more visible on unit   Concentration:  Poor  Recall:  Good  Fund of Knowledge:Fair  Language: Fair  Akathisia:  No  Handed:  Right  AIMS (if indicated):     Assets:  Desire for Improvement  ADL's:  Intact  Cognition: WNL  Sleep:  Number of Hours: 6.75  Assessment - patient presents with depression and ongoing ruminations about psychosocial stressors, mainly financial issues and worries about upcoming disability hearing . She states, however, that she is improved compared to admission and chart notes indicate she has been more visible in day room, milieu. She denies any current SI. She does not endorse medication side effects at present. Lithium dose was increased recently due to sub-therapeutic serum level . Treatment Plan Summary: Daily contact with patient to assess and evaluate symptoms and progress in treatment and Medication management Encourage ongoing  Milieu /group participation to work on coping skills and symptom reduction. Continue Lithium 300 mgrs QAM and 600 mgrs QHS for depression, mood disorder  Pt received Invega Sustenna 156 mg IM recently ( prior to admission)- 06/18/15. Continue Lunesta  1 mg QHS  for insomnia  Continue Vistaril 50 mg  QHS PRN  for sleep if needed  Continue Gabapentin to 300 mg BID  and 400 mg po QHS  for anxiety.  Will continue Vistaril 25 mg  Q 6 hours PRN for anxiety sx if needed     Neita Garnet , MD   06/25/2015, 4:04 PM

## 2015-06-25 NOTE — Tx Team (Signed)
Interdisciplinary Treatment Plan Update (Adult)  Date:  06/25/2015   Time Reviewed:  8:34 AM   Progress in Treatment: Attending groups: Yes. Participating in groups:  Yes. Taking medication as prescribed:  Yes. Tolerating medication:  Yes. Family/Significant other contact made:  Yes Patient understands diagnosis:  Yes  As evidenced by seeking help with depression, SI Discussing patient identified problems/goals with staff:  Yes, see initial care plan. Medical problems stabilized or resolved:  Yes. Denies suicidal/homicidal ideation: Yes. Issues/concerns per patient self-inventory:  No. Other:  New problem(s) identified:  Discharge Plan or Barriers:  See below  Reason for Continuation of Hospitalization: Depression Medication stabilization Suicidal ideation    EstComments:  [name] is an 54 y.o. female that was referred to [facility] by her psychiatrist Dt. [name] due to increasing sx of depression. Pt seen this day cia tele assessment and this clinician just made aware of needing to see this pt this AM when came on shift at 7 AM. Pt reports SI with a plan to overdose on her Ambien. Pt has had SI before and has had for mos recently, but this is the first definite plan she has had. Pt stated she has been staying in the bed all day, sleeping, not grooming or bathing self, and feels fatigued. Pt denies HI. Pt denies AVH or delusions, but reports a hx of psychosis. Pt stated she has been diagnosed with Bipolar Disorder and now with Schizoaffective Disorder. 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 Pt stated she has never felt this depressed in her life.  Neurontin, Lithium, Lunesta  Estimated length of stay:Likely d/c tomorrow  New goal(s):  Review of initial/current patient goals per problem list:   Review of initial/current patient goals per  problem list:  1. Goal(s): Patient will participate in aftercare plan   Met: Yes   Target date: 3-5 days post admission date   As evidenced by: Patient will participate within aftercare plan AEB aftercare provider and housing plan at discharge being identified. 06/21/15:  Plans to return home, follow up outpt    2. Goal (s): Patient will exhibit decreased depressive symptoms and suicidal ideations.   Met: No   Target date: 3-5 days post admission date   As evidenced by: Patient will utilize self rating of depression at 3 or below and demonstrate decreased signs of depression or be deemed stable for discharge by MD. 06/21/15:  [name] is endorsing passive SI today, is rating her depression at a 5 06/25/2015 [name] denies SI, rates her depression a 5 today.      5. Goal(s): Patient will demonstrate decreased signs of psychosis  * Met:Yes  * Target date: 3-5 days post admission date  * As evidenced by: Patient will demonstrate decreased frequency of AVH or return to baseline function 06/20/15  [name] endorses paranoia to the extent that she is avoiding leaving her home. 06/25/2015 Pt is no longer endorsing paranoia          Attendees: Patient:  06/25/2015 8:34 AM   Family:   06/25/2015 8:34 AM   Physician:  Ursula Alert, MD 06/25/2015 8:34 AM   Nursing:   Manuella Ghazi, RN 06/25/2015 8:34 AM   CSW:    Roque Lias, LCSW   06/25/2015 8:34 AM   Other:  06/25/2015 8:34 AM   Other:   06/25/2015 8:34 AM  Other:  Lars Pinks, Nurse CM 06/25/2015 8:34 AM   Other:  [name], [facility] 06/25/2015 8:34 AM   Other:  Norberto Sorenson, [place]  06/25/2015 8:34 AM   Other:  06/25/2015 8:34 AM   Other:  06/25/2015 8:34 AM   Other:  06/25/2015 8:34 AM   Other:  06/25/2015 8:34 AM   Other:  06/25/2015 8:34 AM   Other:   06/25/2015 8:34 AM    Scribe for Treatment Team:   Trish Mage, 06/25/2015 8:34 AM

## 2015-06-25 NOTE — Progress Notes (Signed)
Pt reports she is doing well today.  She denies SI/HI/AVH at time of assessment.  She feels her medications are working for her.  She has been in her room more tonight than last night, but voices no complaints or concerns.  Pt plans to return home at discharge and says she has supportive family.  Her worry is that she will not have an apartment much longer if she does not get her disability started soon.  Support and encouragement offered.  Pt makes her needs known to staff.  Safety maintained with q15 minute checks.

## 2015-06-25 NOTE — BHH Group Notes (Signed)
[facility] LCSW Aftercare Discharge Planning Group Note   06/25/2015 2:52 PM  Participation Quality:  Invited.  In bed asleep.    [name]

## 2015-06-25 NOTE — Progress Notes (Signed)
DAR Note: [name] has been in bed much of the morning.  She did get up to eat and take medications.  She denies any SI/HI or A/V hallucinations.  She didn't attend morning groups today.  She completed her self inventory and reports that her depression is 5/10, hopelessness and anxiety are 3/10.  Her goal for today is "not to be overly sedated from meds." She reports that she is worried about her heart rate being so high.  Informed MD about her concerns and encouraged her to talk with the doctor about her concerns.  Encouraged participation in group and unit activities.  Q 15 minute checks maintained for safety.  We will continue to monitor the progress towards her goals.

## 2015-06-25 NOTE — Plan of Care (Signed)
Problem: Ineffective individual coping Goal: STG: Patient will remain free from self harm Outcome: Progressing [name] has had no episodes of self harm while in the hospital.  She has been able to contract for safety.

## 2015-06-26 DIAGNOSIS — E221 Hyperprolactinemia: Secondary | ICD-10-CM

## 2015-06-26 HISTORY — DX: Hyperprolactinemia: E22.1

## 2015-06-26 MED ORDER — GABAPENTIN 300 MG PO CAPS: 600.0000 mg | ORAL_CAPSULE | Freq: Every day | ORAL | Status: DC

## 2015-06-26 MED ORDER — ESZOPICLONE 2 MG PO TABS
2.0000 mg | ORAL_TABLET | Freq: Every day | ORAL | Status: DC
  Administered 2015-06-26: 2 mg via ORAL
  Filled 2015-06-26: qty 1

## 2015-06-26 MED ORDER — GABAPENTIN 400 MG PO CAPS
400.0000 mg | ORAL_CAPSULE | Freq: Every day | ORAL | Status: DC
  Administered 2015-06-26: 400 mg via ORAL
  Filled 2015-06-26 (×3): qty 1

## 2015-06-26 NOTE — Progress Notes (Signed)
Patient ID: Sheran Luz, female   DOB: 09-Nov-1960, 54 y.o.   MRN: [id number] [facility] MD Progress Note  06/26/2015 3:12 PM KATHRINE RIEVES  MRN:  [id number] Subjective:   Patient reports she is feeling partially improved compared to admission . However she continues to struggle with sleep issues. She had her lunesta reduced recently due to feeling sleepy in the AM, but wants to increase it again to see if that will help.'    Objective: Case discussed with staff and patient seen.Sindee Stucker Mccoin is a 54 y.o. Caucasian female who is divorced , unemployed , awaiting SSD (has upcoming court hearing) , lives byself in a condo , has a hx of schizoaffective disorder , presented to [facility] since she was referred by her psychiatrist Dt. [name] due to increasing sx of depression. Pt per initial notes in EHR "Reported SI with plan to OD on Ambien."  Patient today seen in bed , appears depressed , however is more reactive , seen as smiling often. Pt also feels her depression has been improving. Pt denies SI/HI. Pt with sleep issues- discussed readjusting her medications. Per staff - pt has been attending groups and is compliant on her medications. She however continues to ruminate on her psychosocial stressors like her recent financial and housing issue. Pt continues to need encouragement and support.   Principal Problem: Schizoaffective disorder, bipolar type ([place]) Diagnosis:   Patient Active Problem List   Diagnosis Date Noted  . Hyperprolactinemia ([place]) [E22.1] 06/26/2015  . Schizoaffective disorder, bipolar type ([place]) [F25.0] 06/21/2015  . Borderline personality disorder [F60.3] 06/20/2015  . Lithium toxicity [T56.891A] 05/01/2014  . Leukocytosis [D72.829] 05/01/2014  . Costochondritis [M94.0] 02/14/2014  . Renal stone [N20.0] 02/13/2014  . Fatty liver disease, nonalcoholic [A19.3] 79/09/4095  . Abnormal LFTs [R79.89] 04/06/2013  . Dysuria [R30.0] 02/15/2013  . Medication side effect [T88.7XXA]  01/07/2013  . Lumpy breasts [N60.29] 12/20/2012  . Lump of breast, left [N63] 12/20/2012  . Loss of weight [R63.4] 11/08/2012  . Food aversion [R63.3] 11/08/2012  . Constipation [K59.00] 05/17/2012  . Right ear pain [H92.01] 02/16/2012  . Jaw pain [R68.84] 02/13/2012  . Otitis externa [H60.90] 02/13/2012  . Vitamin D deficiency [268] 09/23/2011  . Loose stools [R19.5] 09/23/2011  . Double vision [H53.2] 03/15/2011  . Hyperglycemia [R73.9] 01/10/2011  . Dyspnea on effort [R06.09] 01/10/2011  . Palpitations [R00.2] 12/08/2010  . ENDOMETRIOSIS [N80.9] 05/11/2008  . DEFICIENCY, VITAMIN D NOS [E55.9] 06/11/2007  . PLANTAR FASCIITIS [M72.2] 06/11/2007  . ALLERGIC RHINITIS [J30.9] 03/26/2007   Total Time spent with patient:  25 minutes   Past Psychiatric History: Pt with hx of mental illness since the past several years . Pt denies suicide attempts. Pt has had multiple hospitalizations - [facility], [facility] [place] . Pt currently follows up with [facility] , has a psychiatrist and therapist there  Past Medical History:  Past Medical History  Diagnosis Date  . Headache(784.0)   . IBS (irritable bowel syndrome)   . Bipolar 1 disorder (HCC)     HX PSYCHOSIS  . Personality disorder     W/ BORDERLINE FEATURES  . History of duodenal ulcer   . Pelvic pain   . Complication of anesthesia     POST AGGITATION  . Frequency of urination   . Urgency of urination   . Nocturia     Past Surgical History  Procedure Laterality Date  . Laparoscopy N/A 12/14/2012    Procedure: LAPAROSCOPY DIAGNOSTIC;  Surgeon: Stark Klein, MD;  Location: [facility];  Service: General;  Laterality: N/A;  . Laparoscopic left salpingoophorectomy and lysys adhesions  03-10-2002  . Cardiovascular stress test  01-23-2011    NORMAL NUCLEAR STUDY/ NO ISCHEMIA/ EF 81%  . Transthoracic echocardiogram  10-13-2010    NORMAL LVF/  EF 55-60%  . Laparoscopic cholecystectomy  11-08-2001  . Cardiac catheterization  10-09-1999  DR  KATZ    NORMAL LVF/  NORMAL RCA/  NO CRITICAL DISEASE LEFT CORONARY SYSTEM  . Laparoscopic removal ovary remnant  2003   [place]  . Total abdominal hysterectomy  2000    W/ RIGHT SALPINGOOPHORECTOMY  . Cystoscopy with biopsy N/A 06/03/2013    Procedure: CYSTOSCOPY WITH BIOPSY  INSTILLATION OF MARCAINE AND PYRIDIUM;  Surgeon: Hanley Ben, MD;  Location: [facility];  Service: Urology;  Laterality: N/A;   Family History:  Family History  Problem Relation Age of Onset  . Sleep apnea Father   . Depression Father   . Migraines Sister     headaches  . Anxiety disorder Sister   . Other Mother     MAC infection  . Anxiety disorder Mother    Family Psychiatric  History: Pt reports mother and sister both have anxiety and father - depression.  Social History:  Patient is divorced , unemployed , used to work as a Marine scientist . Pt currently has applied for disability , awaiting SSD hearing. Pt denies having any children. Pt is religious .         History  Alcohol Use  . 1.8 oz/week  . 3 Cans of beer per week    Comment: 1-2 beers a week     History  Drug Use No    Comment: Patient denies drug use    Social History   Social History  . Marital Status: Single    Spouse Name: N/A  . Number of Children: 0  . Years of Education: college   Occupational History  . N/A     Nurse   Social History Main Topics  . Smoking status: Former Smoker -- 0.01 packs/day for 1 years    Types: Cigarettes    Quit date: 08/18/2009  . Smokeless tobacco: Never Used     Comment: ONLY SMOKED FOR 6 MONTHS --  QUIT  YRS AGO  . Alcohol Use: 1.8 oz/week    3 Cans of beer per week     Comment: 1-2 beers a week  . Drug Use: No     Comment: Patient denies drug use  . Sexual Activity: No   Other Topics Concern  . 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    no tobacco   Caffeine Use: very little, three times a week   Additional Social History:    Pain Medications: see mar Prescriptions: see mar Over the Counter: see mar History of alcohol / drug use?: Yes Longest period of sobriety (when/how long): na  Sleep:  poor  Appetite:   Improved   Current Medications: Current Facility-Administered Medications  Medication Dose Route Frequency Provider Last Rate Last Dose  . acetaminophen (TYLENOL) tablet 650 mg  650 mg Oral Q6H PRN Patrecia Pour, NP      . alum & mag hydroxide-simeth (MAALOX/MYLANTA) 200-200-20 MG/5ML suspension 30 mL  30 mL Oral Q4H PRN Asa Saunas  Lord, NP      . docusate sodium (COLACE) capsule 100 mg  100 mg Oral BID Patrecia Pour, NP   100 mg at 06/26/15 3810  . eszopiclone (LUNESTA) tablet 2 mg  2 mg Oral QHS Lanyla Costello, MD      . gabapentin (NEURONTIN) capsule 300 mg  300 mg Oral BID Patrecia Pour, NP   300 mg at 06/26/15 0824  . gabapentin (NEURONTIN) capsule 400 mg  400 mg Oral QHS Loralye Loberg, MD      . hydrOXYzine (ATARAX/VISTARIL) tablet 25 mg  25 mg Oral TID PRN Ursula Alert, MD   25 mg at 06/26/15 0023  . hydrOXYzine (ATARAX/VISTARIL) tablet 50 mg  50 mg Oral QHS PRN Ursula Alert, MD   50 mg at 06/21/15 2308  . lithium carbonate (LITHOBID) CR tablet 300 mg  300 mg Oral Fortunato Curling, MD   300 mg at 06/26/15 0641  . lithium carbonate (LITHOBID) CR tablet 600 mg  600 mg Oral QPM Charlcie Cradle, MD   600 mg at 06/25/15 1702  . magnesium hydroxide (MILK OF MAGNESIA) suspension 30 mL  30 mL Oral Daily PRN Patrecia Pour, NP   30 mL at 06/24/15 2142  . multivitamin with minerals tablet 1 tablet  1 tablet Oral Daily Patrecia Pour, NP   1 tablet at 06/26/15 [id number]  . pantoprazole (PROTONIX) EC tablet 20 mg  20 mg Oral Daily Patrecia Pour, NP   20 mg at 06/26/15 0824  . simvastatin (ZOCOR) tablet 20 mg  20 mg Oral q1800 Ursula Alert, MD   20 mg at 06/25/15 1702     Lab Results:  No results found for this or any previous visit (from the past 48 hour(s)).  Physical Findings: AIMS: Facial and Oral Movements Muscles of Facial Expression: None, normal Lips and Perioral Area: None, normal Jaw: None, normal Tongue: None, normal,Extremity Movements Upper (arms, wrists, hands, fingers): None, normal Lower (legs, knees, ankles, toes): None, normal, Trunk Movements Neck, shoulders, hips: None, normal, Overall Severity Severity of abnormal movements (highest score from questions above): None, normal Incapacitation due to abnormal movements: None, normal Patient's awareness of abnormal movements (rate only patient's report): No Awareness, Dental Status Current problems with teeth and/or dentures?: No Does patient usually wear dentures?: No  CIWA:  CIWA-Ar Total: 2 COWS:  COWS Total Score: 2  Musculoskeletal: Strength & Muscle Tone: within normal limits Gait & Station: normal Patient leans: N/A  Psychiatric Specialty Exam: Review of Systems  Constitutional: Negative for malaise/fatigue.  Gastrointestinal: Negative for constipation.  Psychiatric/Behavioral: Positive for depression. Negative for suicidal ideas and hallucinations. The patient has insomnia. The patient is not nervous/anxious.   All other systems reviewed and are negative.   Blood pressure 106/75, pulse 110, temperature 97 F (36.1 C), temperature source Oral, resp. rate 16, height 4\' 11"  (1.499 m), weight 66.225 kg (146 lb), SpO2 98 %.Body mass index is 29.47 kg/(m^2).  General Appearance: Fairly Groomed  Engineer, water::  Fair  Speech:  Normal Rate  Volume:  Normal  Mood:   Depressed but states she is feeling better, improving mood   Affect:   Mildly constricted, but smiles often  Thought Process:   Linear   Orientation:  Full (Time, Place, and Person)  Thought Content:   Does not endorse hallucinations and does not appear internally preoccupied   Suicidal Thoughts:  Denies plan or  intention of hurting self or of SI at this time. Able to  contract for safety at present time .  Homicidal Thoughts:  No  Memory - recent and remote grossly intact , immediate -fair  Judgement:  Fair  Insight:  Fair  Psychomotor Activity:  Improving, more visible on unit   Concentration:  Poor  Recall:  Good  Fund of Knowledge:Fair  Language: Fair  Akathisia:  No  Handed:  Right  AIMS (if indicated):     Assets:  Desire for Improvement  ADL's:  Intact  Cognition: WNL  Sleep:  Number of Hours: 6.75  Assessment - patient today presented as improving , however continues to have sleep issues. Will increase her sleep medication tonight. This was recently reduced due to her complaining of feeling drowsy during daytime. However she has agreed to give the higher dose another trial.  Treatment Plan Summary: Daily contact with patient to assess and evaluate symptoms and progress in treatment and Medication management Encourage ongoing  Milieu /group participation to work on coping skills and symptom reduction. Continue Lithium 300 mgrs QAM and 600 mgrs QHS for depression, mood disorder . Next Li level on 06/29/15. Pt received Invega Sustenna 156 mg IM recently ( prior to admission)- 06/18/15. Increase Lunesta to 2 mg po QHS  for insomnia  Continue Vistaril 50 mg  QHS PRN  for sleep if needed  Continue Gabapentin to 300 mg BID  and 400 mg po QHS  for anxiety.  Will continue Vistaril 25 mg  Q 6 hours PRN for anxiety sx if needed  Patient also with hyperprolactinemia - will need PL level followed up on an out pt basis . Its likely due to her Invega IM. CSW will work on disposition.     Cresta Riden , MD   06/26/2015, 3:12 PM

## 2015-06-26 NOTE — [facility] Group Notes (Signed)
[place] LCSW Group Therapy  06/26/2015 , 2:30 PM   Type of Therapy:  Group Therapy  Participation Level:  Active  Participation Quality:  Attentive  Affect:  Appropriate  Cognitive:  Alert  Insight:  Improving  Engagement in Therapy:  Engaged  Modes of Intervention:  Discussion, Exploration and Socialization  Summary of Progress/Problems: Today's group focused on the term Diagnosis.  Participants were asked to define the term, and then pronounce whether it is a negative, positive or neutral term.  Stayed the entire time.  Engaged throughout.  Talked about the importance of her belief in a higher power.  "I think things happen for a reason, and so instead of fighting things that happen, and try to be accepting and see what I can learn from all experiences.  Like being here.  Or going through foreclosue."  [name] B 06/26/2015 , 2:30 PM

## 2015-06-26 NOTE — Progress Notes (Signed)
Pt reports she has had a good day.  She expresses concern about the elections tonight, and says she wants to stay up to see the results.  Pt was informed that patients will be allowed to stay up later to see the results.  Pt denies SI/HI/AVH. She feels her meds are working for her.  Her only concern is about about how to take them when she gets home.  Pt was assured that information would be reviewed with her at her discharge.  Pt plans to return home at discharge.  States her family is supportive.  Pt makes her needs known to staff.  Support and encouragement offered.  Safety maintained with q15 minute checks.

## 2015-06-26 NOTE — Plan of Care (Signed)
Problem: Diagnosis: Increased Risk For Suicide Attempt Goal: LTG-Patient Will Show Positive Response to Medication LTG (by discharge) : Patient will show positive response to medication and will participate in the development of the discharge plan.  Outcome: Progressing [name] reports no suicidal thoughts.  We will continue to monitor the progress towards her goals.

## 2015-06-26 NOTE — Progress Notes (Signed)
DAR Notes: [name] has been visible on the unit.  She has attended groups.  Interacting with staff.   She denies any SI/HI or A/V hallucinations.  She completed her self inventory and reports that her depression is 4/10, hopelessness 2/10 and anxiety 2/10.  She states her goals for today is "getting to sleep better" and she will accomplish this by "taking more sleeping medication if needed."  She denies any physical complaints.  She appears to be in no physical distress.  Encouraged participation in group and unit activities.  Q 15 minute checks maintained for safety.  We will continue to monitor the progress towards her goals.

## 2015-06-27 MED ORDER — LITHIUM CARBONATE ER 300 MG PO TBCR: 600.0000 mg | EXTENDED_RELEASE_TABLET | Freq: Every evening | ORAL | Status: DC

## 2015-06-27 MED ORDER — SIMVASTATIN 20 MG PO TABS: 20.0000 mg | ORAL_TABLET | Freq: Every day | ORAL | Status: DC

## 2015-06-27 MED ORDER — HYDROXYZINE HCL 25 MG PO TABS: 25.0000 mg | ORAL_TABLET | Freq: Three times a day (TID) | ORAL | Status: DC | PRN

## 2015-06-27 MED ORDER — ESZOPICLONE 2 MG PO TABS: 2.0000 mg | ORAL_TABLET | Freq: Every day | ORAL | Status: DC

## 2015-06-27 MED ORDER — LITHIUM CARBONATE ER 300 MG PO TBCR: 300.0000 mg | EXTENDED_RELEASE_TABLET | ORAL | Status: DC

## 2015-06-27 MED ORDER — PALIPERIDONE PALMITATE 156 MG/ML IM SUSP: 156.0000 mg | INTRAMUSCULAR | Status: DC

## 2015-06-27 MED ORDER — GABAPENTIN 400 MG PO CAPS: 400.0000 mg | ORAL_CAPSULE | Freq: Every day | ORAL | Status: DC

## 2015-06-27 MED ORDER — PANTOPRAZOLE SODIUM 20 MG PO TBEC: 20.0000 mg | DELAYED_RELEASE_TABLET | Freq: Every day | ORAL | Status: DC

## 2015-06-27 MED ORDER — ADULT MULTIVITAMIN W/MINERALS CH: 1.0000 | ORAL_TABLET | Freq: Every day | ORAL | Status: DC

## 2015-06-27 MED ORDER — GABAPENTIN 300 MG PO CAPS: 300.0000 mg | ORAL_CAPSULE | Freq: Two times a day (BID) | ORAL | Status: DC

## 2015-06-27 NOTE — Progress Notes (Signed)
Pt. D/C from the unit to lobby.  She was pleasant and cooperative. She voiced no SI/HI or A/V halluciations.  She denies any pain or discomfort.  D/C instructions and medications reviewed with pt.  Pt. verbalized understanding of medications and d/c instructions.   All belongings from locker returned to pt. She completed her self inventory today and reports that her depression was 4/10, hopelessness 2/10 and anxiety 3/10.  She stated that her goal for today was to be discharged today.  Q 15 min checks maintained until discharge.  Pt. Left the unit in no apparent distress.

## 2015-06-27 NOTE — Progress Notes (Signed)
  [facility] Adult Case Management Discharge Plan :  Will you be returning to the same living situation after discharge:  Yes,  will return home At discharge, do you have transportation home?: Yes,  will drive herself Do you have the ability to pay for your medications: Yes,  has medicaid   Release of information consent forms completed and in the chart;  Patient's signature needed at discharge.  Patient to Follow up at: Follow-up Information    Follow up with [facility] On 07/02/2015.   Why:  Monday at 3:30 with Dr Casimiro Needle, then on Wednesday, Nov 23rd at 1:00 with [place] information:   [place] [phone]      Follow up with [facility].   Why:  Call them to set up an assessment appointment   Contact information:   [place] [address]      Next level of care provider has access to [place]  Patient denies SI/HI: Yes,  yes    Safety Planning and Suicide Prevention discussed: Yes,  yes  Have you used any form of tobacco in the last 30 days? (Cigarettes, Smokeless Tobacco, Cigars, and/or Pipes): No  Has patient been referred to the Quitline?: N/A patient is not a smoker  Georga Kaufmann 06/27/2015, 10:46 AM

## 2015-06-27 NOTE — Discharge Summary (Signed)
Physician Discharge Summary Note  Patient:  [name] is an 54 y.o., female MRN:  [id number] DOB:  1960-12-23 Patient phone:  [phone] (home)  Patient address:   [address] [place] [address],  Total Time spent with patient: 45 minutes  Date of Admission:  06/19/2015 Date of Discharge: 06/27/2015  Reason for Admission:   History of Present Illness:: KAMORIA LUCIEN is a 54 y.o. Caucasian female who is divorced , unemployed , awaiting SSD (has upcoming court hearing) , lives byself in a condo , has a hx of schizoaffective disorder , presented to [facility] since she was referred by her psychiatrist Dt. [name] due to increasing sx of depression. Pt per initial notes in EHR "Reported SI with plan to OD on Ambien."  Patient seen and chart reviewed.Discussed patient with treatment team. Pt is well known to [facility] , has multiple admissions here in the past. Pt this AM seen in bed , appears depressed. States she has been progressively getting depressed over the past several weeks. Pt reports that she has low energy , sleep issues , sadness , lack of appetite , lack of pleasure in activities that she enjoyed and SI with plan to OD on Ambien. Pt reports that she has recurrent thoughts about suicide . Pt reports several psychosocial stressors including financial issues. Pt reports she is soon going to lose her Condo since she cannot pay the monthly mortgage. Pt reports she has an upcoming SSD hearing which is in January 2017 and she is awaiting her SSD funds to pay her Condo.  Pt reports intense paranoia about people watching her and trying to harm her . Pt reports she has been avoiding going out in public due to her paranoia. Pt denies AH/VH/HI. Pt reports hx of mood lability , has had manic sx in the past. Pt denies hx of sexual or physical abuse. [facility] , has a therapist and psychiatrist there . Pt currently receiving Invega sustenna IM ? 156  mg - last dose was two days ago. Pt also on Lithium , however Li level was low. Collateral information was obtained from Dr.Pvlosky - who reports pt was feeling very fragile and depressed recently and hence decided to admit her. Pt was doing well until now on the [place] IM .  Principal Problem: Schizoaffective disorder, bipolar type [facility]) Discharge Diagnoses: Patient Active Problem List   Diagnosis Date Noted  . Hyperprolactinemia ([place]) [E22.1] 06/26/2015  . Schizoaffective disorder, bipolar type ([place]) [F25.0] 06/21/2015  . Borderline personality disorder [F60.3] 06/20/2015  . Lithium toxicity [T56.891A] 05/01/2014  . Leukocytosis [D72.829] 05/01/2014  . Costochondritis [M94.0] 02/14/2014  . Renal stone [N20.0] 02/13/2014  . Fatty liver disease, nonalcoholic [I34.7] 42/59/5638  . Abnormal LFTs [R79.89] 04/06/2013  . Dysuria [R30.0] 02/15/2013  . Medication side effect [T88.7XXA] 01/07/2013  . Lumpy breasts [N60.29] 12/20/2012  . Lump of breast, left [N63] 12/20/2012  . Loss of weight [R63.4] 11/08/2012  . Food aversion [R63.3] 11/08/2012  . Constipation [K59.00] 05/17/2012  . Right ear pain [H92.01] 02/16/2012  . Jaw pain [R68.84] 02/13/2012  . Otitis externa [H60.90] 02/13/2012  . Vitamin D deficiency [268] 09/23/2011  . Loose stools [R19.5] 09/23/2011  . Double vision [H53.2] 03/15/2011  . Hyperglycemia [R73.9] 01/10/2011  . Dyspnea on effort [R06.09] 01/10/2011  . Palpitations [R00.2] 12/08/2010  . ENDOMETRIOSIS [N80.9] 05/11/2008  . DEFICIENCY, VITAMIN D NOS [E55.9] 06/11/2007  . PLANTAR FASCIITIS [M72.2] 06/11/2007  .  ALLERGIC RHINITIS [J30.9] 03/26/2007    Musculoskeletal: Strength & Muscle Tone: within normal limits Gait & Station: normal Patient leans: N/A  Psychiatric Specialty Exam: Physical Exam  Review of Systems  Psychiatric/Behavioral: Positive for depression. Negative for suicidal ideas, hallucinations and substance abuse. The patient is  nervous/anxious and has insomnia.   All other systems reviewed and are negative.   Blood pressure 111/74, pulse 96, temperature 98 F (36.7 C), temperature source Oral, resp. rate 16, height 4\' 11"  (1.499 m), weight 66.225 kg (146 lb), SpO2 98 %.Body mass index is 29.47 kg/(m^2).  SEE MD PSE within the SRA   Have you used any form of tobacco in the last 30 days? (Cigarettes, Smokeless Tobacco, Cigars, and/or Pipes): No  Has this patient used any form of tobacco in the last 30 days? (Cigarettes, Smokeless Tobacco, Cigars, and/or Pipes) No  Past Medical History:  Past Medical History  Diagnosis Date  . Headache(784.0)   . IBS (irritable bowel syndrome)   . Bipolar 1 disorder (HCC)     HX PSYCHOSIS  . Personality disorder     W/ BORDERLINE FEATURES  . History of duodenal ulcer   . Pelvic pain   . Complication of anesthesia     POST AGGITATION  . Frequency of urination   . Urgency of urination   . Nocturia     Past Surgical History  Procedure Laterality Date  . Laparoscopy N/A 12/14/2012    Procedure: LAPAROSCOPY DIAGNOSTIC;  Surgeon: Stark Klein, MD;  Location: [facility];  Service: General;  Laterality: N/A;  . Laparoscopic left salpingoophorectomy and lysys adhesions  03-10-2002  . Cardiovascular stress test  01-23-2011    NORMAL NUCLEAR STUDY/ NO ISCHEMIA/ EF 81%  . Transthoracic echocardiogram  10-13-2010    NORMAL LVF/  EF 55-60%  . Laparoscopic cholecystectomy  11-08-2001  . Cardiac catheterization  10-09-1999  DR KATZ    NORMAL LVF/  NORMAL RCA/  NO CRITICAL DISEASE LEFT CORONARY SYSTEM  . Laparoscopic removal ovary remnant  2003   [place]  . Total abdominal hysterectomy  2000    W/ RIGHT SALPINGOOPHORECTOMY  . Cystoscopy with biopsy N/A 06/03/2013    Procedure: CYSTOSCOPY WITH BIOPSY  INSTILLATION OF MARCAINE AND PYRIDIUM;  Surgeon: Hanley Ben, MD;  Location: [place];  Service: Urology;  Laterality: N/A;   Family History:  Family History   Problem Relation Age of Onset  . Sleep apnea Father   . Depression Father   . Migraines Sister     headaches  . Anxiety disorder Sister   . Other Mother     MAC infection  . Anxiety disorder Mother    Social History:  History  Alcohol Use  . 1.8 oz/week  . 3 Cans of beer per week    Comment: 1-2 beers a week     History  Drug Use No    Comment: Patient denies drug use    Social History   Social History  . Marital Status: Single    Spouse Name: N/A  . Number of Children: 0  . Years of Education: college   Occupational History  . N/A     Nurse   Social History Main Topics  . Smoking status: Former Smoker -- 0.01 packs/day for 1 years    Types: Cigarettes    Quit date: 08/18/2009  . Smokeless tobacco: Never Used     Comment: ONLY SMOKED FOR 6 MONTHS --  QUIT  YRS AGO  . Alcohol  Use: 1.8 oz/week    3 Cans of beer per week     Comment: 1-2 beers a week  . Drug Use: No     Comment: Patient denies drug use  . Sexual Activity: No   Other Topics Concern  . 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    no tobacco   Caffeine Use: very little, three times a week    Risk to Self: Is patient at risk for suicide?: No Risk to Others:   Prior Inpatient Therapy:   Prior Outpatient Therapy:    Level of Care:  OP  Hospital Course:   Royetta Crochet Mccartin was admitted for Schizoaffective disorder, bipolar type ([place]) , with psychosis and crisis management.  Pt was treated discharged with the medications listed below under Medication List.  Medical problems were identified and treated as needed.  Home medications were restarted as appropriate.  Improvement was monitored by observation and Sheran Luz 's daily report of symptom reduction.  Emotional and mental status was monitored by daily self-inventory reports  completed by Sheran Luz and clinical staff.         Royetta Crochet Speakman was evaluated by the treatment team for stability and plans for continued recovery upon discharge. Royetta Crochet [place] 's motivation was an integral factor for scheduling further treatment. Employment, transportation, bed availability, health status, family support, and any pending legal issues were also considered during hospital stay. Pt was offered further treatment options upon discharge including but not limited to Residential, Intensive Outpatient, and Outpatient treatment.  Royetta Crochet Magnone will follow up with the services as listed below under Follow Up Information.     Upon completion of this admission the patient was both mentally and medically stable for discharge denying suicidal/homicidal ideation, auditory/visual/tactile hallucinations, delusional thoughts and paranoia.    Pt was treated and responded well with Lithium, Neurontin, Gabapentin, Lunesta, and Paliperidone. Pt was given a long-acting Invega Sustenna injection and her next one is due on 07/18/15 (as on prescription and discharge instructions). Pt will benefit from long-acting injectable to assist with concerns of serum stability in addition to alleviating concerns of potential non-compliance. Additionally, pt was found to have a high prolactin level (asymptomatic). Pt will have this rechecked along with her TSH which was mildly elevated.   Consults:  None  Significant Diagnostic Studies:  Lithium 0.38 (low), A1C 5.7 (H), TSH 4.505 (H), Prolactin 112.5, UDS negative, BAL negative.  Discharge Vitals:   Blood pressure 111/74, pulse 96, temperature 98 F (36.7 C), temperature source Oral, resp. rate 16, height 4\' 11"  (1.499 m), weight 66.225 kg (146 lb), SpO2 98 %. Body mass index is 29.47 kg/(m^2). Lab Results:   No results found for this or any previous visit (from the past 72 hour(s)).  Physical Findings: AIMS: Facial and Oral Movements Muscles of  Facial Expression: None, normal Lips and Perioral Area: None, normal Jaw: None, normal Tongue: None, normal,Extremity Movements Upper (arms, wrists, hands, fingers): None, normal Lower (legs, knees, ankles, toes): None, normal, Trunk Movements Neck, shoulders, hips: None, normal, Overall Severity Severity of abnormal movements (highest score from questions above): None, normal Incapacitation due to abnormal movements: None, normal Patient's awareness of abnormal movements (rate only patient's report): No Awareness, Dental Status Current problems with teeth and/or dentures?: No Does patient usually  wear dentures?: No  CIWA:  CIWA-Ar Total: 2 COWS:  COWS Total Score: 2   See Psychiatric Specialty Exam and Suicide Risk Assessment completed by Attending Physician prior to discharge.  Discharge destination:  Home  Is patient on multiple antipsychotic therapies at discharge:  No   Has Patient had three or more failed trials of antipsychotic monotherapy by history:  No    Recommended Plan for Multiple Antipsychotic Therapies: NA     Medication List    STOP taking these medications        benztropine 0.5 MG tablet  Commonly known as:  COGENTIN     DSS 100 MG Caps     INVEGA 1.5 MG Tb24  Generic drug:  Paliperidone     Levomilnacipran HCl ER 40 MG Cp24     lithium 600 MG capsule  Replaced by:  lithium carbonate 300 MG CR tablet     lithium carbonate 300 MG capsule  Replaced by:  lithium carbonate 300 MG CR tablet     OLANZapine 20 MG tablet  Commonly known as:  ZYPREXA     zolpidem 5 MG tablet  Commonly known as:  AMBIEN      TAKE these medications      Indication   eszopiclone 2 MG Tabs tablet  Commonly known as:  LUNESTA  Take 1 tablet (2 mg total) by mouth at bedtime. Take immediately before bedtime   Indication:  Trouble Sleeping     gabapentin 300 MG capsule  Commonly known as:  NEURONTIN  Take 1 capsule (300 mg total) by mouth 2 (two) times daily.    Indication:  mood stabilization     gabapentin 400 MG capsule  Commonly known as:  NEURONTIN  Take 1 capsule (400 mg total) by mouth at bedtime.   Indication:  mood stabilization     hydrOXYzine 25 MG tablet  Commonly known as:  ATARAX/VISTARIL  Take 1 tablet (25 mg total) by mouth 3 (three) times daily as needed for anxiety.   Indication:  Anxiety Neurosis     lithium carbonate 300 MG CR tablet  Commonly known as:  LITHOBID  Take 1 tablet (300 mg total) by mouth every morning.   Indication:  mood stabilization     lithium carbonate 300 MG CR tablet  Commonly known as:  LITHOBID  Take 2 tablets (600 mg total) by mouth every evening.   Indication:  mood stabilization     multivitamin with minerals Tabs tablet  Take 1 tablet by mouth daily.   Indication:  Vitamin Supplementation     paliperidone 156 MG/ML Susp injection  Commonly known as:  INVEGA SUSTENNA  Inject 1 mL (156 mg total) into the muscle every 30 (thirty) days. TO BE GIVEN AT OFFICE  Start taking on:  07/18/2015   Indication:  psychosis     pantoprazole 20 MG tablet  Commonly known as:  PROTONIX  Take 1 tablet (20 mg total) by mouth daily. For acid reflux.   Indication:  Gastroesophageal Reflux Disease, .     simvastatin 20 MG tablet  Commonly known as:  ZOCOR  Take 1 tablet (20 mg total) by mouth daily at 6 PM.   Indication:  mixed hyperlipidemia           Follow-up Information    Follow up with [facility] On 07/02/2015.   Why:  Monday at 3:30 with Dr Casimiro Needle, then on Wednesday, Nov 23rd at 1:00 with [place] information:  [place] [address]      Follow up with [facility].   Why:  Call them to set up an assessment appointment   Contact information:   [place] [address]      Follow up with [facility]. Schedule an appointment as soon as possible for a visit on 06/29/2015.   Why:   Lithium level check and recheck Prolactin level    Contact information:   [address]  [place] [address] [phone]      Follow-up recommendations:  Activity:  As tolerated Diet:  Heart healthy with low sodium and moderate carbs Other:  See outpatient provider for recheck of lithium on 06/29/15, injection of Paliperidone on 07/18/15, and recheck of Prolactin and TSH also on 11/11.   Comments:   Take all medications as prescribed. Keep all follow-up appointments as scheduled.  Do not consume alcohol or use illegal drugs while on prescription medications. Report any adverse effects from your medications to your primary care provider promptly.  In the event of recurrent symptoms or worsening symptoms, call 911, a crisis hotline, or go to the nearest emergency department for evaluation.   Total Discharge Time: Greater than 30 minutes  Signed: Benjamine Mola, FNP-BC 06/27/2015, 12:23 PM

## 2015-06-27 NOTE — Plan of Care (Signed)
Problem: Alteration in mood Goal: STG-Patient is able to discuss feelings and issues (Patient is able to discuss feelings and issues leading to depression)  Outcome: Completed/Met Date Met:  06/27/15 For the last two evenings, writer has been able to discuss with pt her feelings about her illness and her plans for discharge.  She voices a wellness plan and says her family is supportive.

## 2015-06-27 NOTE — [facility] Suicide Risk Assessment (Signed)
[facility] Discharge Suicide Risk Assessment   Demographic Factors:  Divorced or widowed, Caucasian, Low socioeconomic status and Living alone  Total Time spent with patient: 30 minutes  Musculoskeletal: Strength & Muscle Tone: within normal limits Gait & Station: normal Patient leans: N/A  Psychiatric Specialty Exam: Physical Exam  Review of Systems  Psychiatric/Behavioral: Positive for depression (Improved).  All other systems reviewed and are negative.   Blood pressure 111/74, pulse 96, temperature 98 F (36.7 C), temperature source Oral, resp. rate 16, height 4\' 11"  (1.499 m), weight 66.225 kg (146 lb), SpO2 98 %.Body mass index is 29.47 kg/(m^2).  General Appearance: Casual  Eye Contact::  Good  Speech:  Clear and [id number]  Volume:  Normal  Mood:  Depressed improved  Affect:  Appropriate  Thought Process:  Coherent  Orientation:  Full (Time, Place, and Person)  Thought Content:  WDL  Suicidal Thoughts:  No  Homicidal Thoughts:  No  Memory:  Immediate;   Fair Recent;   Fair Remote;   Fair  Judgement:  Fair  Insight:  Fair  Psychomotor Activity:  Normal  Concentration:  Fair  Recall:  [employer] of Knowledge:Fair  Language: Fair  Akathisia:  No  Handed:  Right  AIMS (if indicated):     Assets:  Communication Skills Desire for Improvement  Sleep:  Number of Hours: 5.5  Cognition: WNL  ADL's:  Intact   Have you used any form of tobacco in the last 30 days? (Cigarettes, Smokeless Tobacco, Cigars, and/or Pipes): No  Has this patient used any form of tobacco in the last 30 days? (Cigarettes, Smokeless Tobacco, Cigars, and/or Pipes) No  Mental Status Per Nursing Assessment::   On Admission:  Suicidal ideation indicated by patient, Suicidal ideation indicated by others, Suicide plan, Self-harm thoughts, Intention to act on suicide plan  Current Mental Status by Physician: pt denies SI/HI/AH/VH  Loss Factors: Legal issues and Financial problems/change in  socioeconomic status  Historical Factors: Impulsivity  Risk Reduction Factors:   Positive therapeutic relationship  Continued Clinical Symptoms:  Previous Psychiatric Diagnoses and Treatments  Cognitive Features That Contribute To Risk:  None    Suicide Risk:  Minimal: No identifiable suicidal ideation.  Patients presenting with no risk factors but with morbid ruminations; may be classified as minimal risk based on the severity of the depressive symptoms  Principal Problem: Schizoaffective disorder, bipolar type [facility]) Discharge Diagnoses:  Patient Active Problem List   Diagnosis Date Noted  . Hyperprolactinemia ([place]) [E22.1] 06/26/2015  . Schizoaffective disorder, bipolar type ([place]) [F25.0] 06/21/2015  . Borderline personality disorder [F60.3] 06/20/2015  . Lithium toxicity [T56.891A] 05/01/2014  . Leukocytosis [D72.829] 05/01/2014  . Costochondritis [M94.0] 02/14/2014  . Renal stone [N20.0] 02/13/2014  . Fatty liver disease, nonalcoholic [I95.1] 88/41/6606  . Abnormal LFTs [R79.89] 04/06/2013  . Dysuria [R30.0] 02/15/2013  . Medication side effect [T88.7XXA] 01/07/2013  . Lumpy breasts [N60.29] 12/20/2012  . Lump of breast, left [N63] 12/20/2012  . Loss of weight [R63.4] 11/08/2012  . Food aversion [R63.3] 11/08/2012  . Constipation [K59.00] 05/17/2012  . Right ear pain [H92.01] 02/16/2012  . Jaw pain [R68.84] 02/13/2012  . Otitis externa [H60.90] 02/13/2012  . Vitamin D deficiency [268] 09/23/2011  . Loose stools [R19.5] 09/23/2011  . Double vision [H53.2] 03/15/2011  . Hyperglycemia [R73.9] 01/10/2011  . Dyspnea on effort [R06.09] 01/10/2011  . Palpitations [R00.2] 12/08/2010  . ENDOMETRIOSIS [N80.9] 05/11/2008  . DEFICIENCY, VITAMIN D NOS [E55.9] 06/11/2007  . PLANTAR FASCIITIS [M72.2] 06/11/2007  .  ALLERGIC RHINITIS [J30.9] 03/26/2007    Follow-up Information    Follow up with [facility] On 07/02/2015.   Why:  Monday at 3:30 with Dr Casimiro Needle, then on  Wednesday, Nov 23rd at 1:00 with [place] information:   [place] [phone]      Plan Of Care/Follow-up recommendations:  Activity:  No restrictions Diet:  Heart healthy Tests:  Li level on 06/29/15, Follow up on your TSH in 3 months, Prolactin level as per out patient recommendations Other:  Invega sustenna 156 mg IM q 30 days - next dose on 07/18/15.  Is patient on multiple antipsychotic therapies at discharge:  No   Has Patient had three or more failed trials of antipsychotic monotherapy by history:  No  Recommended Plan for Multiple Antipsychotic Therapies: NA    Salli Bodin MD 06/27/2015, 9:31 AM

## 2015-06-27 NOTE — Plan of Care (Signed)
Problem: Alteration in mood Goal: LTG-Patient reports reduction in suicidal thoughts (Patient reports reduction in suicidal thoughts and is able to verbalize a safety plan for whenever patient is feeling suicidal)  Outcome: Completed/Met Date Met:  06/27/15 Pt feels her medications are working and she has not had any suicidal thoughts in a couple of days.

## 2015-09-09 IMAGING — US US THYROID BIOPSY
1 series · 13 of 25 positions shown · non-contrast
Comparison: US soft tissue head/neck 03/16/15

MEDICATIONS:
5 cc 1% lidocaine

COMPLICATIONS:
None immediate

INDICATION: Indeterminate thyroid nodule

Rt thyroid nodule
1.8 x 1.2 x 1.4 cm
EXAM:
ULTRASOUND GUIDED FINE NEEDLE ASPIRATION OF INDETERMINATE THYROID
NODULE
TECHNIQUE: Informed written consent was obtained from the patient after a
discussion of the risks, benefits and alternatives to treatment.
Questions regarding the procedure were encouraged and answered. A
timeout was performed prior to the initiation of the procedure.

[Series 1: us thyroid biopsy · 0.07mm/px · 29 acquisitions, 13 frames shown]
[im 1/29]
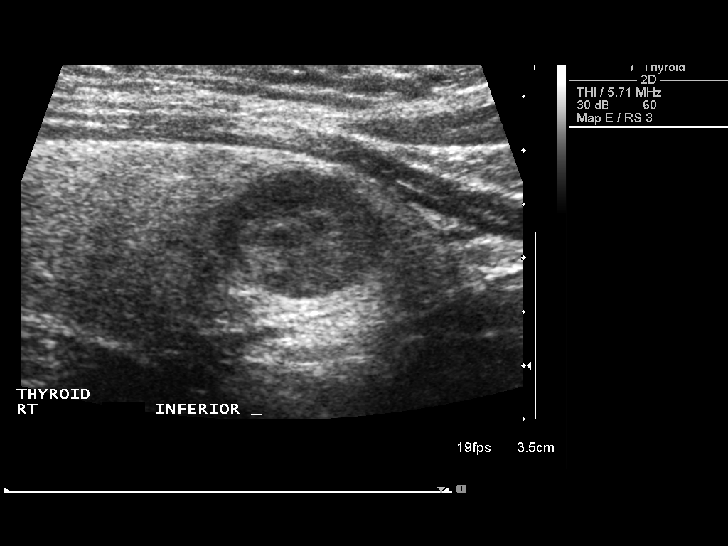
[im 3/29]
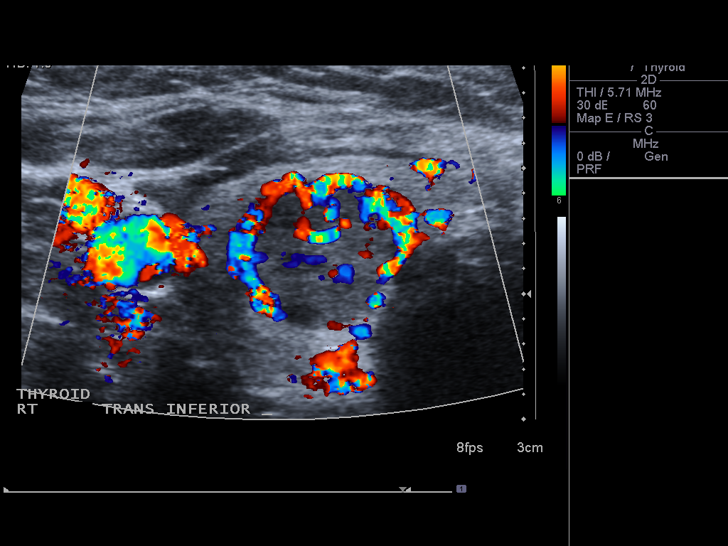
[im 5/29]
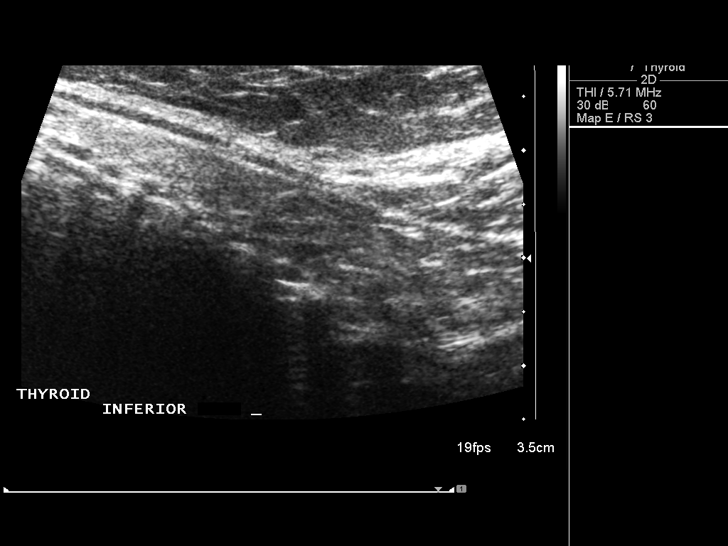
[im 8/29]
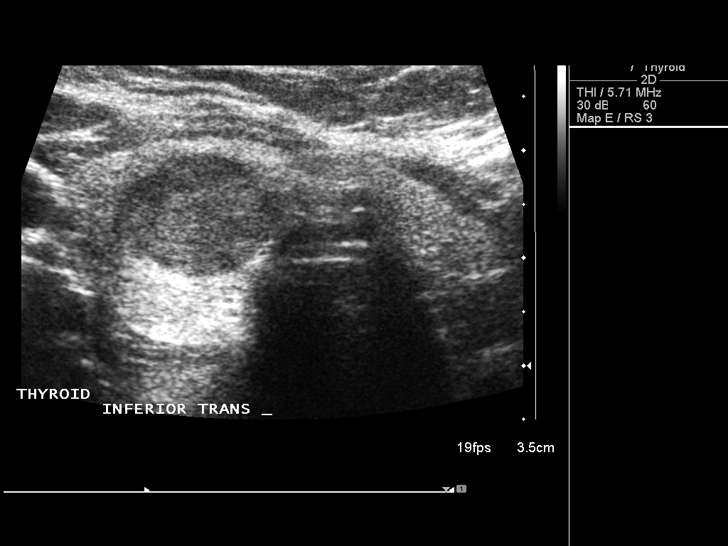
[im 10/29]
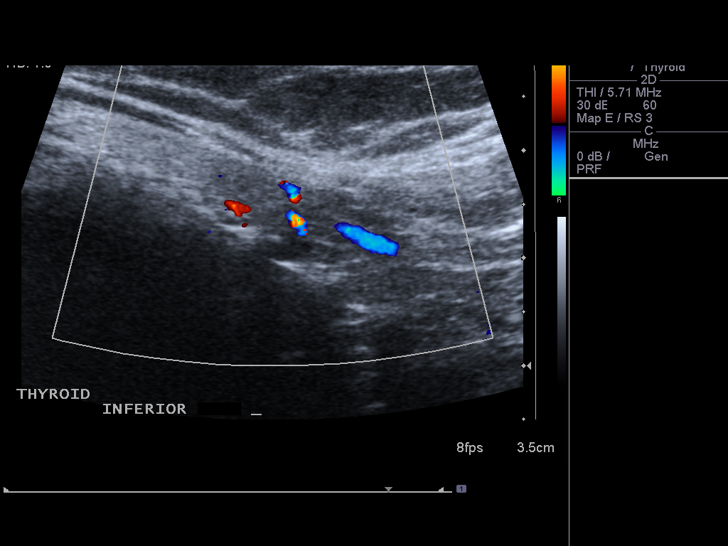
[im 12/29]
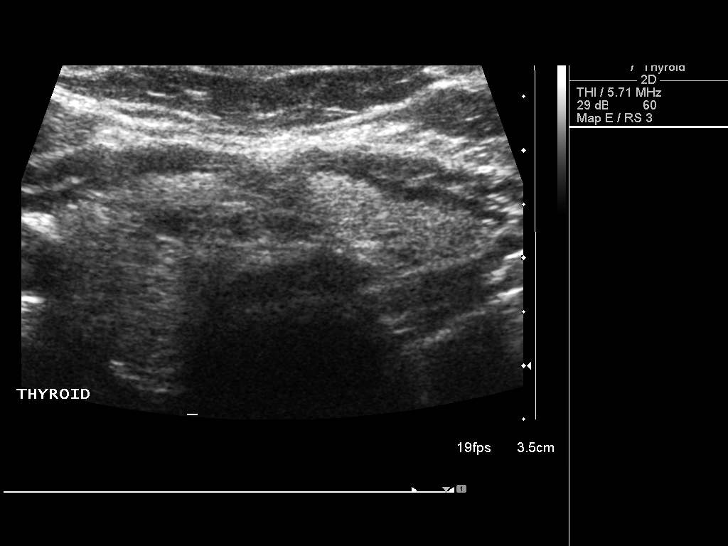
[im 15/29]
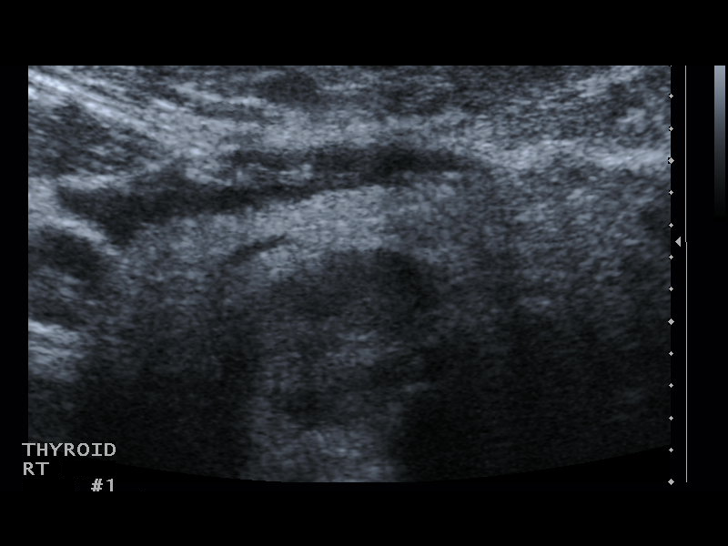
[im 17/29]
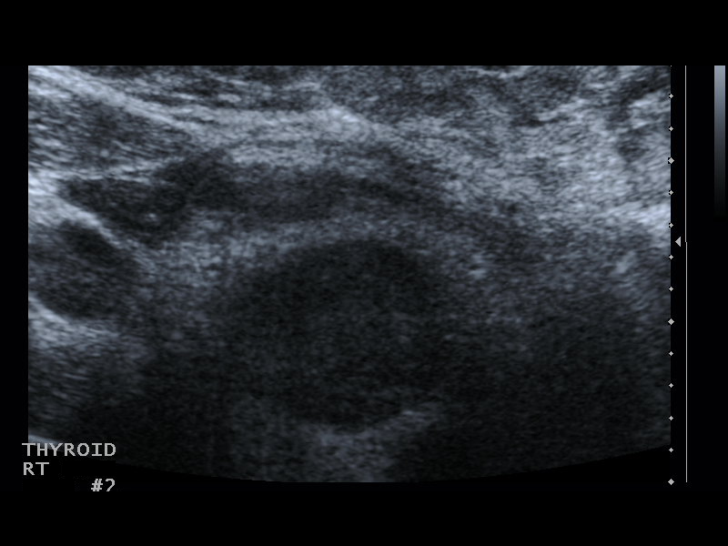
[im 19/29]
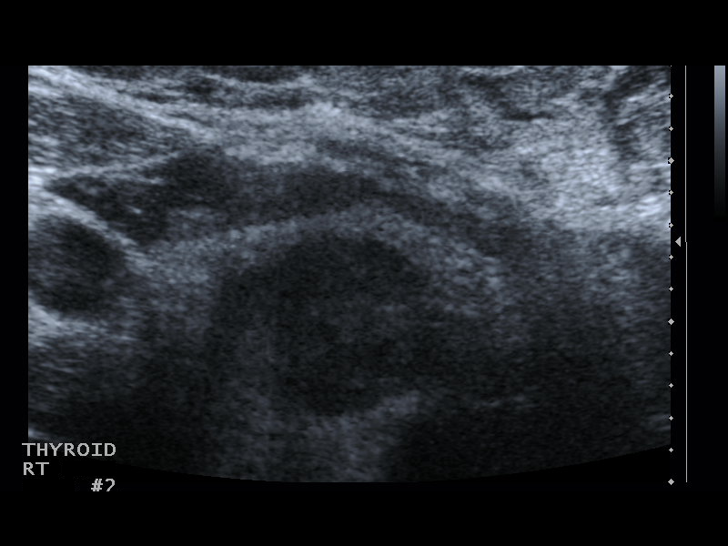
[im 22/29]
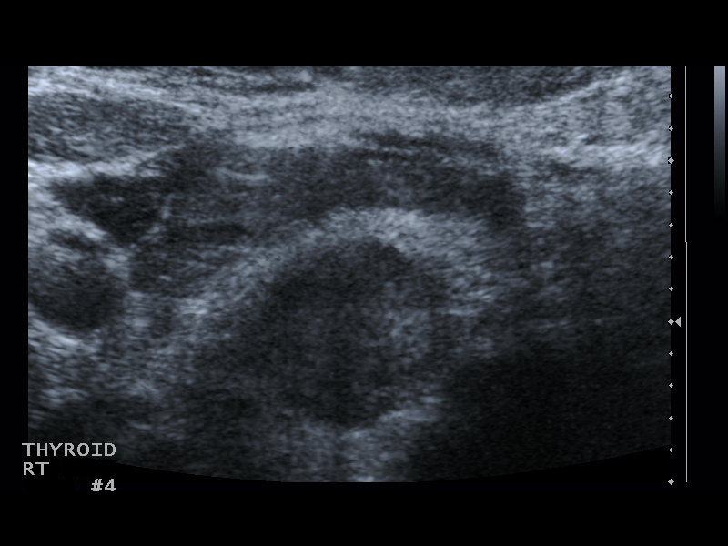
[im 24/29]
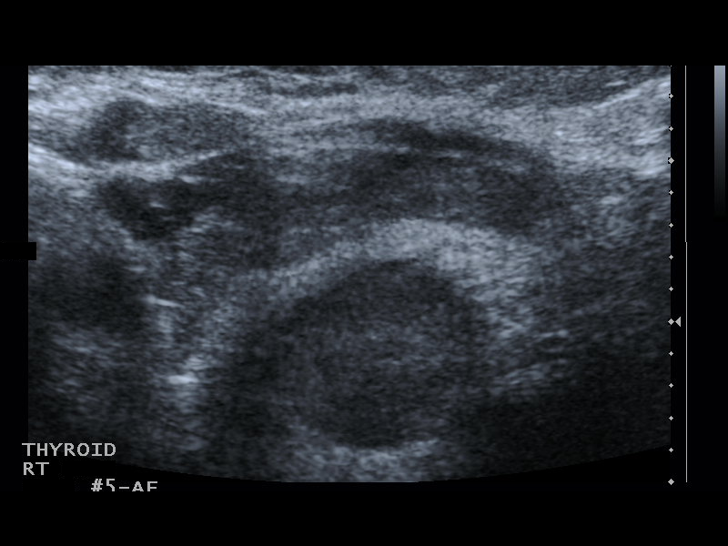
[im 26/29]
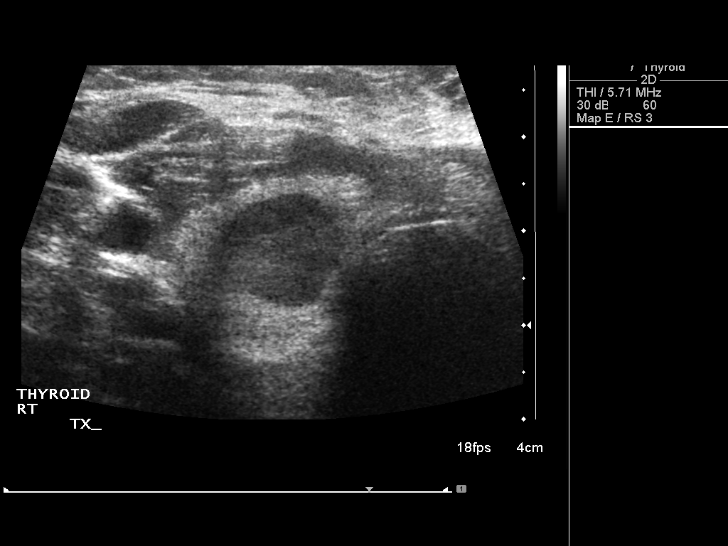
[im 29/29]
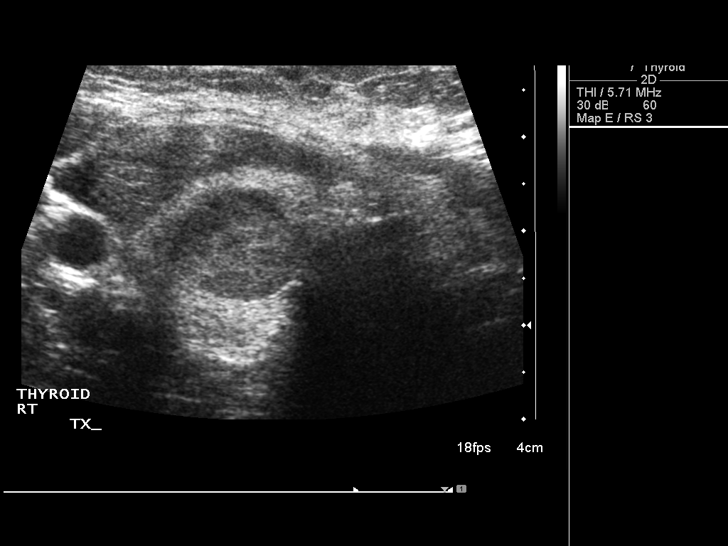

[13 of 25 positions shown; findings below may reference images not displayed]

Pre-procedural ultrasound scanning demonstrated Rt thyroid nodule;
pseudonodule at isthmus area per Dr Rajaul

The procedure was planned. The neck was prepped in the usual sterile
fashion, and a sterile drape was applied covering the operative
field. A timeout was performed prior to the initiation of the
procedure. Local anesthesia was provided with 1% lidocaine.

Under direct ultrasound guidance, 3 FNA biopsies and 2 FNA biopsies
for AFIRMA were performed of the Rt thyroid nodule with a 25 gauge
needle. The samples were prepared and submitted to pathology.

Limited post procedural scanning was negative for hematoma or
additional complication. Dressings were placed. The patient
tolerated the above procedures procedure well without immediate
postprocedural complication.
IMPRESSION: Technically successful ultrasound guided fine needle aspiration of
Rt thyroid nodule.

No distinct nodule seen at isthmus (pseudonodule), therefore no
biopsy performed there per Dr Rajaul

Read by:  Hongshan Mussard

## 2016-02-27 DIAGNOSIS — R490 Dysphonia: Secondary | ICD-10-CM | POA: Insufficient documentation

## 2016-02-27 HISTORY — DX: Dysphonia: R49.0

## 2016-09-22 DIAGNOSIS — K3184 Gastroparesis: Secondary | ICD-10-CM | POA: Diagnosis not present

## 2016-09-22 DIAGNOSIS — Z79899 Other long term (current) drug therapy: Secondary | ICD-10-CM | POA: Diagnosis not present

## 2016-09-22 DIAGNOSIS — Z1231 Encounter for screening mammogram for malignant neoplasm of breast: Secondary | ICD-10-CM | POA: Diagnosis not present

## 2016-09-22 DIAGNOSIS — N3010 Interstitial cystitis (chronic) without hematuria: Secondary | ICD-10-CM | POA: Diagnosis not present

## 2016-09-22 DIAGNOSIS — Z6827 Body mass index (BMI) 27.0-27.9, adult: Secondary | ICD-10-CM | POA: Diagnosis not present

## 2016-09-22 DIAGNOSIS — Z0001 Encounter for general adult medical examination with abnormal findings: Secondary | ICD-10-CM | POA: Diagnosis not present

## 2016-09-22 DIAGNOSIS — R112 Nausea with vomiting, unspecified: Secondary | ICD-10-CM | POA: Diagnosis not present

## 2016-09-22 DIAGNOSIS — F603 Borderline personality disorder: Secondary | ICD-10-CM | POA: Diagnosis not present

## 2016-09-22 DIAGNOSIS — E782 Mixed hyperlipidemia: Secondary | ICD-10-CM | POA: Diagnosis not present

## 2016-09-22 DIAGNOSIS — Z0000 Encounter for general adult medical examination without abnormal findings: Secondary | ICD-10-CM | POA: Diagnosis not present

## 2016-09-22 DIAGNOSIS — F3112 Bipolar disorder, current episode manic without psychotic features, moderate: Secondary | ICD-10-CM | POA: Diagnosis not present

## 2016-09-22 DIAGNOSIS — R7989 Other specified abnormal findings of blood chemistry: Secondary | ICD-10-CM | POA: Diagnosis not present

## 2016-09-22 DIAGNOSIS — G909 Disorder of the autonomic nervous system, unspecified: Secondary | ICD-10-CM | POA: Diagnosis not present

## 2016-09-25 DIAGNOSIS — Z79899 Other long term (current) drug therapy: Secondary | ICD-10-CM | POA: Diagnosis not present

## 2016-09-25 DIAGNOSIS — R799 Abnormal finding of blood chemistry, unspecified: Secondary | ICD-10-CM | POA: Diagnosis not present

## 2016-10-02 DIAGNOSIS — Z79899 Other long term (current) drug therapy: Secondary | ICD-10-CM | POA: Diagnosis not present

## 2016-10-03 DIAGNOSIS — R490 Dysphonia: Secondary | ICD-10-CM | POA: Diagnosis not present

## 2016-10-03 DIAGNOSIS — F3112 Bipolar disorder, current episode manic without psychotic features, moderate: Secondary | ICD-10-CM | POA: Diagnosis not present

## 2016-10-03 DIAGNOSIS — D72829 Elevated white blood cell count, unspecified: Secondary | ICD-10-CM | POA: Diagnosis not present

## 2016-10-03 DIAGNOSIS — F250 Schizoaffective disorder, bipolar type: Secondary | ICD-10-CM | POA: Diagnosis not present

## 2016-10-22 ENCOUNTER — Emergency Department (HOSPITAL_BASED_OUTPATIENT_CLINIC_OR_DEPARTMENT_OTHER)
Admission: EM | Admit: 2016-10-22 | Discharge: 2016-10-22 | Disposition: A | Discharge status: Home / Self Care | Payer: Medicare Other | Attending: Physician Assistant | Admitting: Physician Assistant

## 2016-10-22 ENCOUNTER — Encounter (HOSPITAL_BASED_OUTPATIENT_CLINIC_OR_DEPARTMENT_OTHER): Payer: Self-pay

## 2016-10-22 DIAGNOSIS — Z87891 Personal history of nicotine dependence: Secondary | ICD-10-CM | POA: Insufficient documentation

## 2016-10-22 DIAGNOSIS — R350 Frequency of micturition: Secondary | ICD-10-CM | POA: Diagnosis not present

## 2016-10-22 DIAGNOSIS — N3000 Acute cystitis without hematuria: Secondary | ICD-10-CM | POA: Diagnosis not present

## 2016-10-22 DIAGNOSIS — N3001 Acute cystitis with hematuria: Secondary | ICD-10-CM | POA: Diagnosis not present

## 2016-10-22 LAB — URINALYSIS, ROUTINE W REFLEX MICROSCOPIC
BILIRUBIN URINE: NEGATIVE
Glucose, UA: NEGATIVE mg/dL
HGB URINE DIPSTICK: NEGATIVE
KETONES UR: NEGATIVE mg/dL
Nitrite: POSITIVE — AB
Protein, ur: NEGATIVE mg/dL
SPECIFIC GRAVITY, URINE: 1.011 (ref 1.005–1.030)
pH: 7 (ref 5.0–8.0)

## 2016-10-22 LAB — URINALYSIS, MICROSCOPIC (REFLEX)

## 2016-10-22 MED ORDER — CEPHALEXIN 500 MG PO CAPS: 500.0000 mg | ORAL_CAPSULE | Freq: Two times a day (BID) | ORAL | 0 refills | Status: DC

## 2016-10-22 MED ORDER — PHENAZOPYRIDINE HCL 200 MG PO TABS: 200.0000 mg | ORAL_TABLET | Freq: Three times a day (TID) | ORAL | 0 refills | Status: DC | PRN

## 2016-10-22 MED FILL — CEPHALEXIN 500 MG CAPSULE: 500 | 7 days supply | Qty: 14 | Fill #0

## 2016-10-22 NOTE — ED Notes (Signed)
ED Provider at bedside. 

## 2016-10-22 NOTE — Discharge Instructions (Signed)
Take the prescribed medication as directed.  The pyridium will change your urine bright orange/red in color which is normal. Follow-up with your primary care doctor. Return to the ED for new or worsening symptoms.

## 2016-10-22 NOTE — ED Triage Notes (Signed)
C/o urinary freq, dysuria x 1 week-pt cancelled appt with PCP to come to ED-NAD-steady gait

## 2016-10-22 NOTE — ED Provider Notes (Signed)
[facility] Provider Note   [name]: [id number] Arrival date & time: 10/22/16  1516     History   Chief Complaint Chief Complaint  Patient presents with  . Urinary Frequency    HPI [name] is a 56 y.o. female.  The history is provided by the patient and medical records.  Urinary Frequency     56 year old female with history of bipolar disorder, history of IBS, personality disorder, presenting to the ED for urinary frequency and dysuria. She reports this is been ongoing for the past 2 days. States she gets the urge to urinate, but is only able to produce a few drops and then feel she needs to urinate again. Reports some mild discomfort when urinating. She denies any hematuria. No fever, chills, flank pain, or abdominal pain. She has no history of kidney stones. No medications tried for her symptoms prior to arrival.  Patient does have history of chronic interstitial cystitis. Patient did have an appointment with her primary care doctor, however she canceled this and came here instead.  Past Medical History:  Diagnosis Date  . Bipolar 1 disorder (HCC)    HX PSYCHOSIS  . Complication of anesthesia    POST AGGITATION  . Frequency of urination   . Headache(784.0)   . History of duodenal ulcer   . IBS (irritable bowel syndrome)   . Nocturia   . Pelvic pain   . Personality disorder    W/ BORDERLINE FEATURES  . Urgency of urination     Patient Active Problem List   Diagnosis Date Noted  . Hyperprolactinemia ([place]) 06/26/2015  . Schizoaffective disorder, bipolar type ([place]) 06/21/2015  . Borderline personality disorder 06/20/2015  . Lithium toxicity 05/01/2014  . Leukocytosis 05/01/2014  . Costochondritis 02/14/2014  . Renal stone 02/13/2014  . Fatty liver disease, nonalcoholic 92/06/9416  . Abnormal LFTs 04/06/2013  . Dysuria 02/15/2013  . Medication side effect 01/07/2013  . Lumpy breasts 12/20/2012  . Lump of breast, left 12/20/2012  . Loss of weight  11/08/2012  . Food aversion 11/08/2012  . Constipation 05/17/2012  . Right ear pain 02/16/2012  . Jaw pain 02/13/2012  . Otitis externa 02/13/2012  . Vitamin D deficiency 09/23/2011  . Loose stools 09/23/2011  . Double vision 03/15/2011  . Hyperglycemia 01/10/2011  . Dyspnea on effort 01/10/2011  . Palpitations 12/08/2010  . ENDOMETRIOSIS 05/11/2008  . DEFICIENCY, VITAMIN D NOS 06/11/2007  . PLANTAR FASCIITIS 06/11/2007  . ALLERGIC RHINITIS 03/26/2007    Past Surgical History:  Procedure Laterality Date  . CARDIAC CATHETERIZATION  10-09-1999  DR KATZ   NORMAL LVF/  NORMAL RCA/  NO CRITICAL DISEASE LEFT CORONARY SYSTEM  . CARDIOVASCULAR STRESS TEST  01-23-2011   NORMAL NUCLEAR STUDY/ NO ISCHEMIA/ EF 81%  . CYSTOSCOPY WITH BIOPSY N/A 06/03/2013   Procedure: CYSTOSCOPY WITH BIOPSY  INSTILLATION OF MARCAINE AND PYRIDIUM;  Surgeon: Hanley Ben, MD;  Location: [facility];  Service: Urology;  Laterality: N/A;  . LAPAROSCOPIC CHOLECYSTECTOMY  11-08-2001  . LAPAROSCOPIC LEFT SALPINGOOPHORECTOMY AND LYSYS ADHESIONS  03-10-2002  . LAPAROSCOPIC REMOVAL OVARY REMNANT  2003   [place]  . LAPAROSCOPY N/A 12/14/2012   Procedure: LAPAROSCOPY DIAGNOSTIC;  Surgeon: Stark Klein, MD;  Location: [facility];  Service: General;  Laterality: N/A;  . TOTAL ABDOMINAL HYSTERECTOMY  2000   W/ RIGHT SALPINGOOPHORECTOMY  . TRANSTHORACIC ECHOCARDIOGRAM  10-13-2010   NORMAL LVF/  EF 55-60%    OB History    No data available  Home Medications    Prior to Admission medications   Medication Sig Start Date End Date Taking? Authorizing Provider  eszopiclone (LUNESTA) 2 MG TABS tablet Take 1 tablet (2 mg total) by mouth at bedtime. Take immediately before bedtime 06/27/15   Benjamine Mola, FNP  gabapentin (NEURONTIN) 300 MG capsule Take 1 capsule (300 mg total) by mouth 2 (two) times daily. 06/27/15   Benjamine Mola, FNP  gabapentin (NEURONTIN) 400 MG capsule Take 1 capsule (400 mg  total) by mouth at bedtime. 06/27/15   Benjamine Mola, FNP  hydrOXYzine (ATARAX/VISTARIL) 25 MG tablet Take 1 tablet (25 mg total) by mouth 3 (three) times daily as needed for anxiety. 06/27/15   Benjamine Mola, FNP  lithium carbonate (LITHOBID) 300 MG CR tablet Take 1 tablet (300 mg total) by mouth every morning. 06/27/15   Benjamine Mola, FNP  lithium carbonate (LITHOBID) 300 MG CR tablet Take 2 tablets (600 mg total) by mouth every evening. 06/27/15   Benjamine Mola, FNP  Multiple Vitamin (MULTIVITAMIN WITH MINERALS) TABS tablet Take 1 tablet by mouth daily. 06/27/15   Benjamine Mola, FNP  paliperidone (INVEGA SUSTENNA) 156 MG/ML SUSP injection Inject 1 mL (156 mg total) into the muscle every 30 (thirty) days. TO BE GIVEN AT OFFICE 07/18/15   Benjamine Mola, FNP  pantoprazole (PROTONIX) 20 MG tablet Take 1 tablet (20 mg total) by mouth daily. For acid reflux. 06/27/15   Benjamine Mola, FNP  simvastatin (ZOCOR) 20 MG tablet Take 1 tablet (20 mg total) by mouth daily at 6 PM. 06/27/15   Benjamine Mola, FNP    Family History Family History  Problem Relation Age of Onset  . Sleep apnea Father   . Depression Father   . Migraines Sister     headaches  . Anxiety disorder Sister   . Other Mother     MAC infection  . Anxiety disorder Mother     Social History Social History  Substance Use Topics  . Smoking status: Former Smoker    Packs/day: 0.01    Years: 1.00    Types: Cigarettes    Quit date: 08/18/2009  . Smokeless tobacco: Never Used     Comment: ONLY SMOKED FOR 6 MONTHS --  QUIT  YRS AGO  . Alcohol use Yes     Comment: occ     Allergies   Morphine and related; Latuda [lurasidone hcl]; Morphine; and Bupropion   Review of Systems Review of Systems  Genitourinary: Positive for dysuria, frequency and urgency.  All other systems reviewed and are negative.    Physical Exam Updated Vital Signs BP 140/95 (BP Location: Left Arm)   Pulse 89   Temp 98.4 F (36.9 C) (Oral)   Resp 18    Ht 5' (1.524 m)   Wt 66.2 kg   SpO2 97%   BMI 28.51 kg/m   Physical Exam  Constitutional: She is oriented to person, place, and time. She appears well-developed and well-nourished.  Appears well, nontoxic, no distress  HENT:  Head: Normocephalic and atraumatic.  Mouth/Throat: Oropharynx is clear and moist.  Eyes: Conjunctivae and EOM are normal. Pupils are equal, round, and reactive to light.  Neck: Normal range of motion.  Cardiovascular: Normal rate, regular rhythm and normal heart sounds.   Pulmonary/Chest: Effort normal and breath sounds normal.  Abdominal: Soft. Bowel sounds are normal. There is no tenderness. There is no rigidity, no guarding and no CVA tenderness.  Soft, nontender,  no peritoneal signs, no CVA tenderness  Musculoskeletal: Normal range of motion.  Neurological: She is alert and oriented to person, place, and time.  Skin: Skin is warm and dry.  Psychiatric: She has a normal mood and affect.  Nursing note and vitals reviewed.    ED Treatments / Results  Labs (all labs ordered are listed, but only abnormal results are displayed) Labs Reviewed  URINALYSIS, ROUTINE W REFLEX MICROSCOPIC - Abnormal; Notable for the following:       Result Value   Color, Urine AMBER (*)    APPearance CLOUDY (*)    Nitrite POSITIVE (*)    Leukocytes, UA TRACE (*)    All other components within normal limits  URINALYSIS, MICROSCOPIC (REFLEX) - Abnormal; Notable for the following:    Bacteria, UA FEW (*)    Squamous Epithelial / LPF 6-30 (*)    All other components within normal limits  URINE CULTURE    EKG  EKG Interpretation None       Radiology No results found.  Procedures Procedures (including critical care time)  Medications Ordered in ED Medications - No data to display   Initial Impression / Assessment and Plan / ED Course  I have reviewed the triage vital signs and the nursing notes.  Pertinent labs & imaging results that were available during my  care of the patient were reviewed by me and considered in my medical decision making (see chart for details).  56 year old female here with urinary symptoms. She does have history of interstitial cystitis.  She is afebrile and nontoxic. Her abdomen is soft and benign. No CVA tenderness. UA obtained here, nitrite positive with bacteria noted. For culture. Will start on Keflex and Pyridium. Encouraged to follow-up closely with PCP.  Discussed plan with patient, she acknowledged understanding and agreed with plan of care.  Return precautions given for new or worsening symptoms.  Final Clinical Impressions(s) / ED Diagnoses   Final diagnoses:  Acute cystitis without hematuria  Urinary frequency    New Prescriptions New Prescriptions   CEPHALEXIN (KEFLEX) 500 MG CAPSULE    Take 1 capsule (500 mg total) by mouth 2 (two) times daily.   PHENAZOPYRIDINE (PYRIDIUM) 200 MG TABLET    Take 1 tablet (200 mg total) by mouth 3 (three) times daily as needed for pain.     Larene Pickett, PA-C 10/22/16 [place], MD 10/22/16 2307

## 2016-10-24 LAB — URINE CULTURE

## 2016-11-03 DIAGNOSIS — F29 Unspecified psychosis not due to a substance or known physiological condition: Secondary | ICD-10-CM | POA: Diagnosis not present

## 2016-11-18 DIAGNOSIS — N3010 Interstitial cystitis (chronic) without hematuria: Secondary | ICD-10-CM | POA: Diagnosis not present

## 2016-11-26 DIAGNOSIS — F3131 Bipolar disorder, current episode depressed, mild: Secondary | ICD-10-CM | POA: Diagnosis not present

## 2016-12-03 DIAGNOSIS — F29 Unspecified psychosis not due to a substance or known physiological condition: Secondary | ICD-10-CM | POA: Diagnosis not present

## 2016-12-18 DIAGNOSIS — F3131 Bipolar disorder, current episode depressed, mild: Secondary | ICD-10-CM | POA: Diagnosis not present

## 2017-01-16 DIAGNOSIS — F29 Unspecified psychosis not due to a substance or known physiological condition: Secondary | ICD-10-CM | POA: Diagnosis not present

## 2017-01-17 DIAGNOSIS — J029 Acute pharyngitis, unspecified: Secondary | ICD-10-CM | POA: Diagnosis not present

## 2017-02-02 DIAGNOSIS — Z7289 Other problems related to lifestyle: Secondary | ICD-10-CM | POA: Diagnosis not present

## 2017-02-02 DIAGNOSIS — E041 Nontoxic single thyroid nodule: Secondary | ICD-10-CM | POA: Insufficient documentation

## 2017-02-02 DIAGNOSIS — R490 Dysphonia: Secondary | ICD-10-CM | POA: Diagnosis not present

## 2017-02-02 HISTORY — DX: Nontoxic single thyroid nodule: E04.1

## 2017-02-05 DIAGNOSIS — F450 Somatization disorder: Secondary | ICD-10-CM | POA: Diagnosis not present

## 2017-02-05 DIAGNOSIS — F3131 Bipolar disorder, current episode depressed, mild: Secondary | ICD-10-CM | POA: Diagnosis not present

## 2017-02-11 ENCOUNTER — Other Ambulatory Visit: Payer: Self-pay | Admitting: Otolaryngology

## 2017-02-11 DIAGNOSIS — E041 Nontoxic single thyroid nodule: Secondary | ICD-10-CM

## 2017-02-12 ENCOUNTER — Ambulatory Visit
Admission: RE | Admit: 2017-02-12 | Discharge: 2017-02-12 | Disposition: A | Discharge status: Home / Self Care | Payer: Medicare Other | Source: Ambulatory Visit | Attending: Otolaryngology | Admitting: Otolaryngology

## 2017-02-12 DIAGNOSIS — E042 Nontoxic multinodular goiter: Secondary | ICD-10-CM | POA: Diagnosis not present

## 2017-02-12 DIAGNOSIS — E041 Nontoxic single thyroid nodule: Secondary | ICD-10-CM

## 2017-02-26 DIAGNOSIS — F3131 Bipolar disorder, current episode depressed, mild: Secondary | ICD-10-CM | POA: Diagnosis not present

## 2017-03-12 ENCOUNTER — Telehealth: Payer: Self-pay | Admitting: Internal Medicine

## 2017-03-12 NOTE — Telephone Encounter (Signed)
Pt last seen dr Regis Bill 2015. Pt had to switch PCP due to being on medicaid. Pt now has medicare and would like to re-est. Can I sch?

## 2017-03-13 NOTE — Telephone Encounter (Signed)
Please advise 

## 2017-03-16 DIAGNOSIS — F29 Unspecified psychosis not due to a substance or known physiological condition: Secondary | ICD-10-CM | POA: Diagnosis not present

## 2017-03-19 DIAGNOSIS — F3131 Bipolar disorder, current episode depressed, mild: Secondary | ICD-10-CM | POA: Diagnosis not present

## 2017-03-20 NOTE — Telephone Encounter (Signed)
Not taking new adult patients    But  Maybe you can establish with another of the providers here .

## 2017-03-23 NOTE — Telephone Encounter (Signed)
FYI

## 2017-03-24 NOTE — Telephone Encounter (Signed)
lmom for pt to call back

## 2017-03-26 NOTE — Telephone Encounter (Signed)
Pt will callback to sch with another provider

## 2017-04-03 DIAGNOSIS — F29 Unspecified psychosis not due to a substance or known physiological condition: Secondary | ICD-10-CM | POA: Diagnosis not present

## 2017-04-22 ENCOUNTER — Ambulatory Visit: Payer: Self-pay | Admitting: Family Medicine

## 2017-05-04 ENCOUNTER — Ambulatory Visit: Payer: Self-pay | Admitting: Family Medicine

## 2017-05-12 ENCOUNTER — Encounter: Payer: Self-pay | Admitting: Family Medicine

## 2017-05-12 ENCOUNTER — Ambulatory Visit: Payer: Medicare Other | Admitting: Family Medicine

## 2017-05-12 ENCOUNTER — Ambulatory Visit (INDEPENDENT_AMBULATORY_CARE_PROVIDER_SITE_OTHER): Payer: Medicare Other | Admitting: Family Medicine

## 2017-05-12 VITALS — BP 112/80 | HR 100 | Resp 12 | Ht 60.0 in | Wt 141.1 lb

## 2017-05-12 DIAGNOSIS — Z1239 Encounter for other screening for malignant neoplasm of breast: Secondary | ICD-10-CM

## 2017-05-12 DIAGNOSIS — E782 Mixed hyperlipidemia: Secondary | ICD-10-CM | POA: Insufficient documentation

## 2017-05-12 DIAGNOSIS — E042 Nontoxic multinodular goiter: Secondary | ICD-10-CM | POA: Diagnosis not present

## 2017-05-12 DIAGNOSIS — F250 Schizoaffective disorder, bipolar type: Secondary | ICD-10-CM

## 2017-05-12 DIAGNOSIS — R5383 Other fatigue: Secondary | ICD-10-CM | POA: Diagnosis not present

## 2017-05-12 DIAGNOSIS — G4700 Insomnia, unspecified: Secondary | ICD-10-CM

## 2017-05-12 DIAGNOSIS — R490 Dysphonia: Secondary | ICD-10-CM | POA: Diagnosis not present

## 2017-05-12 DIAGNOSIS — Z1231 Encounter for screening mammogram for malignant neoplasm of breast: Secondary | ICD-10-CM | POA: Diagnosis not present

## 2017-05-12 DIAGNOSIS — E559 Vitamin D deficiency, unspecified: Secondary | ICD-10-CM | POA: Diagnosis not present

## 2017-05-12 HISTORY — DX: Insomnia, unspecified: G47.00

## 2017-05-12 HISTORY — DX: Nontoxic multinodular goiter: E04.2

## 2017-05-12 HISTORY — DX: Mixed hyperlipidemia: E78.2

## 2017-05-12 NOTE — Patient Instructions (Addendum)
A few things to remember from today's visit:   Vitamin D deficiency - Plan: VITAMIN D 25 Hydroxy (Vit-D Deficiency, Fractures)  Fatigue, unspecified type - Plan: TSH, CBC  Hyperlipidemia, mixed - Plan: Comprehensive metabolic panel, Lipid panel  Breast cancer screening - Plan: MM SCREENING BREAST TOMO BILATERAL  It is a common symptom associated with multiple factors: psychologic,medications, systemic illness, sleep disorders,infections, and unknown causes. Some work-up can be done to evaluate for common causes as thyroid disease,anemia,diabetes, or abnormalities in calcium,potassium,or sodium. Regular physical activity as tolerated and a healthy diet is usually might help and usually recommended for chronic fatigue.  Please be sure medication list is accurate. If a new problem present, please set up appointment sooner than planned today.

## 2017-05-12 NOTE — Progress Notes (Signed)
HPI:   [name] is a 56 y.o. female, who is here today to establish care.  Former PCP: Dr [name] Last preventive routine visit: 09/2016  Chronic medical problems: Horseness and thyroid nodule (follows with ENT). Bipolar dz and schizo,insomnia, IC,vit D def,elevated LFT's,HLD, prediabetes.  She lives with her female partner and her 2 children (partner's).  Hyperlipidemia:  Currently on Simvastatin 20 mg daily. Following a low fat diet: Yes.  She has not noted side effects with medication.  Lab Results  Component Value Date   CHOL 187 06/21/2015   HDL 40 (L) 06/21/2015   LDLCALC 75 06/21/2015   TRIG 359 (H) 06/21/2015   CHOLHDL 4.7 06/21/2015    Lab Results  Component Value Date   ALT 47 06/18/2015   AST 34 06/18/2015   ALKPHOS 105 06/18/2015   BILITOT 1.0 06/18/2015    Concerns today:  Fatigue: Chronic. She wonders if there is something causing problem other than psychiatric disorder. She has history of insomnia, she is currently on Ambien 10 mg daily, lower dose did not help.  Sleeps about 10-12 hours.   Anxiety: She is on Lorazepam 0.5 mg twice daily as needed. She also takes Gabapentin 300 mg at bedtime, and lithium carbonate 600 mg daily. She reports lithium level done by her psychiatrist 3-4 months ago.  She denies having trouble staying awake while she is driving. No history of sleep apnea, she is reporting sleep study done in the past. She has history of vitamin D deficiency, currently she is not on supplementation.  Unstable gait and frequent falls. According to patient, medications were recently adjusted but she has not noted major differences on gait.  She does not have a cane or a walker.  Hoarseness: She is also reporting history of dysphonia, she feels like it is getting worse, intermittently during the day. She has not identified exacerbating factors, it is sometimes alleviated by clearing her throat. She is afraid of possibility of  thyroid cancer, her father had metastatic thyroid cancer. Dr [name] has also followed thyroid ([place] and Bx).  She denies GERD-like symptoms, dysphagia, or postnasal drainage. She has history of allergic rhinitis. She has followed with ENT, Dr [name], last seen 01/7123. According to patient, she was referred to [facility] but she couldn't afford treatment.     Review of Systems  Constitutional: Positive for fatigue. Negative for activity change, appetite change, fever and unexpected weight change.  HENT: Positive for voice change. Negative for mouth sores, nosebleeds, sore throat and trouble swallowing.   Eyes: Negative for redness and visual disturbance.  Respiratory: Negative for cough, shortness of breath and wheezing.   Cardiovascular: Negative for chest pain, palpitations and leg swelling.  Gastrointestinal: Negative for abdominal pain, blood in stool, nausea and vomiting.       Negative for changes in bowel habits.  Endocrine: Negative for cold intolerance, heat intolerance, polydipsia, polyphagia and polyuria.  Genitourinary: Negative for decreased urine volume, dysuria and hematuria.  Musculoskeletal: Positive for gait problem. Negative for myalgias.  Skin: Negative for pallor and rash.  Allergic/Immunologic: Positive for environmental allergies.  Neurological: Negative for seizures, syncope, weakness, numbness and headaches.  Hematological: Negative for adenopathy. Does not bruise/bleed easily.  Psychiatric/Behavioral: Positive for sleep disturbance. Negative for confusion and suicidal ideas. The patient is nervous/anxious.       Current Outpatient Prescriptions on File Prior to Visit  Medication Sig Dispense Refill  . gabapentin (NEURONTIN) 300 MG capsule Take 1 capsule (300  mg total) by mouth 2 (two) times daily. (Patient taking differently: Take 300 mg by mouth at bedtime. ) 60 capsule 0  . hydrOXYzine (ATARAX/VISTARIL) 25 MG tablet Take 1 tablet (25 mg total) by mouth 3 (three)  times daily as needed for anxiety. (Patient taking differently: Take 25 mg by mouth at bedtime. ) 30 tablet 0  . lithium carbonate (LITHOBID) 300 MG CR tablet Take 2 tablets (600 mg total) by mouth every evening. (Patient taking differently: Take 1 tablet by mouth in the morning and 2 tablets in the evening.) 60 tablet 0  . simvastatin (ZOCOR) 20 MG tablet Take 1 tablet (20 mg total) by mouth daily at 6 PM. 30 tablet 0   No current facility-administered medications on file prior to visit.      Past Medical History:  Diagnosis Date  . Anxiety   . Bipolar 1 disorder (HCC)    HX PSYCHOSIS  . Complication of anesthesia    POST AGGITATION  . Frequency of urination   . Headache(784.0)   . History of duodenal ulcer   . Hyperlipidemia   . IBS (irritable bowel syndrome)   . Nocturia   . Pelvic pain   . Personality disorder    W/ BORDERLINE FEATURES  . Urgency of urination    Allergies  Allergen Reactions  . Morphine And Related Nausea And Vomiting  . Latuda [Lurasidone Hcl]   . Morphine Nausea Only  . Bupropion Rash    Family History  Problem Relation Age of Onset  . Sleep apnea Father   . Depression Father   . Alcohol abuse Father   . Hypertension Father   . Migraines Sister        headaches  . Anxiety disorder Sister   . Other Mother        MAC infection  . Anxiety disorder Mother   . Dementia Mother   . Heart disease Paternal Grandmother   . Hyperlipidemia Paternal Grandmother   . Alcohol abuse Paternal Grandfather   . Diabetes Neg Hx     Social History   Social History  . Marital status: Single    Spouse name: [name]  . Number of children: 0  . Years of education: college   Occupational History  . [name]     Nurse   Social History Main Topics  . Smoking status: Former Smoker    Packs/day: 0.01    Years: 1.00    Types: Cigarettes    Quit date: 08/18/2009  . Smokeless tobacco: Never Used     Comment: ONLY SMOKED FOR 6 MONTHS --  QUIT  YRS AGO  . Alcohol use  Yes     Comment: occ  . Drug use: No  . Sexual activity: Not Asked   Other Topics Concern  . 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    no tobacco   Caffeine Use: very little, three times a week    Vitals:   05/12/17 1148  BP: 112/80  Pulse: 100  Resp: 12  SpO2: 98%    Body mass index is 27.56 kg/m.   Physical Exam  Nursing note and vitals reviewed. Constitutional: She is oriented to person, place, and time. She appears well-developed. No distress.  HENT:  Head: Normocephalic and atraumatic.  Mouth/Throat: Oropharynx is clear and moist and  mucous membranes are normal.  Eyes: Pupils are equal, round, and reactive to light. Conjunctivae are normal.  Neck: No tracheal deviation present. Thyromegaly present. No thyroid mass present.  Cardiovascular: Normal rate and regular rhythm.   No murmur heard. Pulses:      Dorsalis pedis pulses are 2+ on the right side, and 2+ on the left side.  Respiratory: Effort normal and breath sounds normal. No respiratory distress.  GI: Soft. She exhibits no mass. There is no hepatomegaly. There is no tenderness.  Musculoskeletal: She exhibits no edema or tenderness.  Lymphadenopathy:    She has no cervical adenopathy.       Right: No supraclavicular adenopathy present.       Left: No supraclavicular adenopathy present.  Neurological: She is alert and oriented to person, place, and time. She has normal strength.  Slow gait, otherwise stable with no assistance.  Skin: Skin is warm. No erythema.  Psychiatric:  Fairly groomed, good eye contact. Flat affect, slow speech.    ASSESSMENT AND PLAN:   Ms. Ameya was seen today for establish care.  Diagnoses and all orders for this visit:  Fatigue, unspecified type  Chronic. We discussed possible etiologies: Systemic illness,  immunologic,endocrinology,sleep disorder, psychiatric/psychologic, infectious,medications side effects, and idiopathic. In her case problem can be aggravated or caused by psychiatric disorders and medication side effects.  Healthy diet and regular physical activity may help.  Further recommendations will be given according to lab results.  -     TSH; Future -     CBC; Future  Insomnia, unspecified type  Well controlled on current management. Good sleep hygiene also recommended. We discussed some side effects of Ambien, she states that lower doses have not helped. She has also tried other medications unsuccessfully.  She will continue following with her psychiatrist, Dr. Casimiro Needle.  Vitamin D deficiency, unspecified  Further recommendations will be given according to lab results.  -     VITAMIN D 25 Hydroxy (Vit-D Deficiency, Fractures); Future  Hyperlipidemia, mixed  No changes in current Simvastatin dose. Continue low-fat diet. She is not fasting today, so she will be back for fasting labs next week.  -     Comprehensive metabolic panel; Future -     Lipid panel; Future  Multinodular goiter  Following with Dr Dagmar Hait. Thyroid US last done in 01/2017: Right lower pole nodule has enlarged and measures 2.7 cm. This underwent biopsy in 2016 with benign results. and thyroid Bx right thyroid nodule 04/2015 negative.  Breast cancer screening -     MM SCREENING BREAST TOMO BILATERAL; Future  Schizoaffective disorder, bipolar type [facility])  She is following with psychiatrist every 3-4 months and psychotherapist every 3 weeks. Next appt with Dr Casimiro Needle in 05/2017. No changes in current treatment. We discussed some side effects of some of her medications.  Hoarseness of voice  I do not appreciate dysphonia during visit. We discussed possible etiologies, including GERD and allergies. She was instructed to continue following with ENT.     [name] G. [name], MD  [facility]. [place] office.

## 2017-05-15 ENCOUNTER — Other Ambulatory Visit (INDEPENDENT_AMBULATORY_CARE_PROVIDER_SITE_OTHER): Payer: Medicare Other

## 2017-05-15 DIAGNOSIS — E782 Mixed hyperlipidemia: Secondary | ICD-10-CM

## 2017-05-15 DIAGNOSIS — E559 Vitamin D deficiency, unspecified: Secondary | ICD-10-CM | POA: Diagnosis not present

## 2017-05-15 DIAGNOSIS — R5383 Other fatigue: Secondary | ICD-10-CM

## 2017-05-15 LAB — CBC
HCT: 41.8 % (ref 36.0–46.0)
Hemoglobin: 13.8 g/dL (ref 12.0–15.0)
MCHC: 32.9 g/dL (ref 30.0–36.0)
MCV: 94.2 fl (ref 78.0–100.0)
Platelets: 337 10*3/uL (ref 150.0–400.0)
RBC: 4.44 Mil/uL (ref 3.87–5.11)
RDW: 12.4 % (ref 11.5–15.5)
WBC: 7.6 10*3/uL (ref 4.0–10.5)

## 2017-05-15 LAB — COMPREHENSIVE METABOLIC PANEL
ALK PHOS: 55 U/L (ref 39–117)
ALT: 20 U/L (ref 0–35)
AST: 15 U/L (ref 0–37)
Albumin: 4.4 g/dL (ref 3.5–5.2)
BILIRUBIN TOTAL: 0.9 mg/dL (ref 0.2–1.2)
BUN: 11 mg/dL (ref 6–23)
CO2: 26 meq/L (ref 19–32)
CREATININE: 0.9 mg/dL (ref 0.40–1.20)
Calcium: 10.6 mg/dL — ABNORMAL HIGH (ref 8.4–10.5)
Chloride: 105 mEq/L (ref 96–112)
GFR: 69 mL/min (ref >60.00)
GLUCOSE: 91 mg/dL (ref 70–99)
Potassium: 3.9 mEq/L (ref 3.5–5.1)
SODIUM: 137 meq/L (ref 135–145)
TOTAL PROTEIN: 6.9 g/dL (ref 6.0–8.3)

## 2017-05-15 LAB — LIPID PANEL
Cholesterol: 128 mg/dL (ref 0–200)
HDL: 49.9 mg/dL (ref >39.00)
LDL Cholesterol: 54 mg/dL (ref 0–99)
NONHDL: 78.15
Total CHOL/HDL Ratio: 3
Triglycerides: 119 mg/dL (ref 0.0–149.0)
VLDL: 23.8 mg/dL (ref 0.0–40.0)

## 2017-05-15 LAB — TSH: TSH: 4.45 u[IU]/mL (ref 0.35–4.50)

## 2017-05-15 LAB — VITAMIN D 25 HYDROXY (VIT D DEFICIENCY, FRACTURES): VITD: 26.19 ng/mL — ABNORMAL LOW (ref 30.00–100.00)

## 2017-05-19 ENCOUNTER — Encounter: Payer: Self-pay | Admitting: Family Medicine

## 2017-05-20 ENCOUNTER — Other Ambulatory Visit: Payer: Self-pay

## 2017-05-22 ENCOUNTER — Other Ambulatory Visit: Payer: Self-pay

## 2017-06-01 ENCOUNTER — Other Ambulatory Visit: Payer: Self-pay | Admitting: Family Medicine

## 2017-06-01 DIAGNOSIS — F29 Unspecified psychosis not due to a substance or known physiological condition: Secondary | ICD-10-CM | POA: Diagnosis not present

## 2017-06-21 DIAGNOSIS — R5382 Chronic fatigue, unspecified: Secondary | ICD-10-CM

## 2017-06-21 HISTORY — DX: Chronic fatigue, unspecified: R53.82

## 2017-06-21 NOTE — Progress Notes (Deleted)
HPI:  No chief complaint on file.   Ms.Jeanean E Jehle is a 56 y.o. female, who is here today complaining of persistent fatigue. She was last seen on 05/12/17, when we dicussed possible causes of fatigue.  Lab Results  Component Value Date   WBC 7.6 05/15/2017   HGB 13.8 05/15/2017   HCT 41.8 05/15/2017   MCV 94.2 05/15/2017   PLT 337.0 05/15/2017   Lab Results  Component Value Date   CREATININE 0.90 05/15/2017   BUN 11 05/15/2017   NA 137 05/15/2017   K 3.9 05/15/2017   CL 105 05/15/2017   CO2 26 05/15/2017   Lab Results  Component Value Date   ALT 20 05/15/2017   AST 15 05/15/2017   ALKPHOS 55 05/15/2017   BILITOT 0.9 05/15/2017   Lab Results  Component Value Date   TSH 4.45 05/15/2017    Vit D low, 25 OH vit D 26.19. She is on *** Ca++ 10.6 in 04/2017.  She has Hx of insomnia, vit D deficiency,anxiety, bipolar disorder,and allergies. She takes Ambien,lithium, Lorazepam,Gabapentin among some.  Lab Results  Component Value Date   HGBA1C 5.7 (H) 06/21/2015    *** sleep apnea or louder snoring.   Review of Systems    Current Outpatient Medications on File Prior to Visit  Medication Sig Dispense Refill  . ARIPiprazole (ABILIFY) 5 MG tablet Take 5 mg by mouth daily.    . BUPROPION HCL PO Take 3 tablets by mouth in the morning.    . gabapentin (NEURONTIN) 300 MG capsule Take 1 capsule (300 mg total) by mouth 2 (two) times daily. (Patient taking differently: Take 300 mg by mouth at bedtime. ) 60 capsule 0  . hydrOXYzine (ATARAX/VISTARIL) 25 MG tablet Take 1 tablet (25 mg total) by mouth 3 (three) times daily as needed for anxiety. (Patient taking differently: Take 25 mg by mouth at bedtime. ) 30 tablet 0  . lithium carbonate (LITHOBID) 300 MG CR tablet Take 2 tablets (600 mg total) by mouth every evening. (Patient taking differently: Take 1 tablet by mouth in the morning and 2 tablets in the evening.) 60 tablet 0  . LORazepam (ATIVAN) 0.5 MG tablet  Take 1 tablet by mouth in the morning and 2 tablets by mouth in the evening.    . simvastatin (ZOCOR) 20 MG tablet TAKE 1 TABLET AT BEDTIME 90 tablet 1  . zolpidem (AMBIEN) 10 MG tablet Take 10 mg by mouth at bedtime.     No current facility-administered medications on file prior to visit.      Past Medical History:  Diagnosis Date  . Anxiety   . Bipolar 1 disorder (HCC)    HX PSYCHOSIS  . Complication of anesthesia    POST AGGITATION  . Frequency of urination   . Headache(784.0)   . History of duodenal ulcer   . Hyperlipidemia   . IBS (irritable bowel syndrome)   . Nocturia   . Pelvic pain   . Personality disorder    W/ BORDERLINE FEATURES  . Urgency of urination    Allergies  Allergen Reactions  . Morphine And Related Nausea And Vomiting  . Latuda [Lurasidone Hcl]   . Morphine Nausea Only  . Bupropion Rash    Social History   Socioeconomic History  . Marital status: Single    Spouse name: Not on file  . Number of children: 0  . Years of education: college  . Highest education level: Not on file  Social Needs  . Financial resource strain: Not on file  . Food insecurity - worry: Not on file  . Food insecurity - inability: Not on file  . Transportation needs - medical: Not on file  . Transportation needs - non-medical: Not on file  Occupational History  . Occupation: N/A    Comment: Nurse  Tobacco Use  . Smoking status: Former Smoker    Packs/day: 0.01    Years: 1.00    Pack years: 0.01    Types: Cigarettes    Last attempt to quit: 08/18/2009    Years since quitting: 7.8  . Smokeless tobacco: Never Used  . Tobacco comment: ONLY SMOKED FOR 6 MONTHS --  QUIT  YRS AGO  Substance and Sexual Activity  . Alcohol use: Yes    Comment: occ  . Drug use: No  . Sexual activity: Not on file  Other Topics Concern  . 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    no tobacco   Caffeine Use: very little, three times a week    There were no vitals filed for this visit. There is no height or weight on file to calculate BMI.      Physical Exam    ASSESSMENT AND PLAN:     There are no diagnoses linked to this encounter.      No Follow-up on file.     -Ms.Royetta Crochet Lough was advised to seek immediate medical attention if sudden worsening symptoms or to follow if they persist or if new concerns arise.       [name]. [name], MD  [facility]. [place] office.

## 2017-06-22 ENCOUNTER — Ambulatory Visit: Payer: Medicare Other | Admitting: Family Medicine

## 2017-06-22 DIAGNOSIS — Z0289 Encounter for other administrative examinations: Secondary | ICD-10-CM

## 2017-07-17 ENCOUNTER — Other Ambulatory Visit: Payer: Self-pay | Admitting: Family Medicine

## 2017-07-17 DIAGNOSIS — Z1231 Encounter for screening mammogram for malignant neoplasm of breast: Secondary | ICD-10-CM

## 2017-07-30 DIAGNOSIS — J40 Bronchitis, not specified as acute or chronic: Secondary | ICD-10-CM | POA: Diagnosis not present

## 2017-07-30 DIAGNOSIS — R05 Cough: Secondary | ICD-10-CM | POA: Diagnosis not present

## 2017-08-17 ENCOUNTER — Ambulatory Visit: Payer: Self-pay

## 2017-08-20 DIAGNOSIS — J209 Acute bronchitis, unspecified: Secondary | ICD-10-CM | POA: Diagnosis not present

## 2017-08-31 DIAGNOSIS — F29 Unspecified psychosis not due to a substance or known physiological condition: Secondary | ICD-10-CM | POA: Diagnosis not present

## 2017-09-01 ENCOUNTER — Ambulatory Visit: Payer: Self-pay

## 2017-09-11 DIAGNOSIS — F29 Unspecified psychosis not due to a substance or known physiological condition: Secondary | ICD-10-CM | POA: Diagnosis not present

## 2017-09-11 DIAGNOSIS — F3131 Bipolar disorder, current episode depressed, mild: Secondary | ICD-10-CM | POA: Diagnosis not present

## 2017-09-18 ENCOUNTER — Ambulatory Visit
Admission: RE | Admit: 2017-09-18 | Discharge: 2017-09-18 | Disposition: A | Discharge status: Home / Self Care | Payer: Medicare Other | Source: Ambulatory Visit | Attending: Family Medicine | Admitting: Family Medicine

## 2017-09-18 DIAGNOSIS — Z1231 Encounter for screening mammogram for malignant neoplasm of breast: Secondary | ICD-10-CM

## 2017-10-12 DIAGNOSIS — F29 Unspecified psychosis not due to a substance or known physiological condition: Secondary | ICD-10-CM | POA: Diagnosis not present

## 2017-10-14 ENCOUNTER — Encounter: Payer: Self-pay | Admitting: Family Medicine

## 2017-10-14 ENCOUNTER — Ambulatory Visit (INDEPENDENT_AMBULATORY_CARE_PROVIDER_SITE_OTHER): Payer: Medicare Other | Admitting: Family Medicine

## 2017-10-14 VITALS — BP 133/82 | HR 96 | Temp 98.7°F | Resp 12 | Ht 60.0 in | Wt 152.2 lb

## 2017-10-14 DIAGNOSIS — G44309 Post-traumatic headache, unspecified, not intractable: Secondary | ICD-10-CM

## 2017-10-14 DIAGNOSIS — Z5181 Encounter for therapeutic drug level monitoring: Secondary | ICD-10-CM

## 2017-10-14 DIAGNOSIS — R0989 Other specified symptoms and signs involving the circulatory and respiratory systems: Secondary | ICD-10-CM | POA: Diagnosis not present

## 2017-10-14 DIAGNOSIS — R2681 Unsteadiness on feet: Secondary | ICD-10-CM

## 2017-10-14 DIAGNOSIS — R296 Repeated falls: Secondary | ICD-10-CM

## 2017-10-14 DIAGNOSIS — R319 Hematuria, unspecified: Secondary | ICD-10-CM | POA: Diagnosis not present

## 2017-10-14 DIAGNOSIS — J989 Respiratory disorder, unspecified: Secondary | ICD-10-CM

## 2017-10-14 DIAGNOSIS — F250 Schizoaffective disorder, bipolar type: Secondary | ICD-10-CM | POA: Diagnosis not present

## 2017-10-14 DIAGNOSIS — R5382 Chronic fatigue, unspecified: Secondary | ICD-10-CM | POA: Diagnosis not present

## 2017-10-14 MED ORDER — PROAIR HFA 108 (90 BASE) MCG/ACT IN AERS: 2.0000 | INHALATION_SPRAY | RESPIRATORY_TRACT | 2 refills | Status: DC | PRN

## 2017-10-14 NOTE — Patient Instructions (Addendum)
A few things to remember from today's visit:   Chronic fatigue - Plan: Basic metabolic panel, CBC, TSH  Frequent falls - Plan: Ambulatory referral to Physical Therapy  Unstable gait - Plan: Basic metabolic panel, CBC, Ambulatory referral to Physical Therapy  Encounter for medication monitoring - Plan: Lithium level  Post-traumatic headache, not intractable, unspecified chronicity pattern - Plan: CT Head Wo Contrast  Reactive airway disease that is not asthma - Plan: PROAIR HFA 108 (90 Base) MCG/ACT inhaler, DG Chest 2 View  Hematuria, unspecified type - Plan: Urinalysis, Routine w reflex microscopic  Stop Simvastatin.  Fall precautions.   Please be sure medication list is accurate. If a new problem present, please set up appointment sooner than planned today.

## 2017-10-14 NOTE — Progress Notes (Signed)
ACUTE VISIT   HPI:  Chief Complaint  Patient presents with  . Fatigue    causing falls, has fallen 4 to 5 times within the last month  . Wheezing    chest congestion, worse at night    [name] is a 57 y.o. female, who is here today with her wife with several complaints.  Productive cough since early 07/2017, she cannot bring sputum up. According to pt, she was Dx with bronchitis and she was treated with Azithromycin. She has not noted chills or fever. + Wheezing and chest congestion. She has not noted dyspnea or chest pain.   Worsening fatigue also since 07/2017. Frequent falls, she has fallen about 4-5 tomes for the past month or so. A month ago she felt and hit her head, she denies LOC. No MS changes. Have severe fronto-parietal headaches, 10/10,about once per week since head trauma. Associated nausea, no vomiting or neurologic deficit.  + Tremor,which she also reports as new.Intermittent, not sure about exacerbating factors. It is not interfering with daily activities.  Hx of bipolar disorder,she follows with psychiatrist. She is on Lithium,Ambien,Ativan, Wellbutrin, and Abilify.  Lab Results  Component Value Date   TSH 4.45 05/15/2017   Constant achy legs. She has not noted rash or edema.  She has not identified exacerbating or alleviating factors.  She is also reporting "pink" urine a couple times in 08/2017. She has had this problem before and according to pt,she was Dx with IC. Denies dysuria,increased urinary frequency, or decreased urine output. + Nocturia,stable for years.    Review of Systems  Constitutional: Positive for activity change, chills and fatigue. Negative for appetite change and fever.  HENT: Positive for sinus pressure. Negative for congestion, facial swelling, mouth sores, nosebleeds, rhinorrhea, sore throat and trouble swallowing.   Eyes: Negative for redness and visual disturbance.  Respiratory: Positive for cough  and wheezing. Negative for shortness of breath.   Cardiovascular: Negative for palpitations and leg swelling.  Gastrointestinal: Positive for nausea. Negative for abdominal pain and vomiting.       Negative for changes in bowel habits.  Endocrine: Negative for cold intolerance, heat intolerance, polydipsia, polyphagia and polyuria.  Genitourinary: Positive for hematuria. Negative for decreased urine volume and dysuria.  Musculoskeletal: Positive for arthralgias, gait problem and myalgias.  Skin: Negative for rash and wound.  Allergic/Immunologic: Positive for environmental allergies.  Neurological: Positive for light-headedness and headaches. Negative for seizures, syncope, facial asymmetry and numbness.  Hematological: Negative for adenopathy. Does not bruise/bleed easily.  Psychiatric/Behavioral: Negative for confusion and hallucinations. The patient is nervous/anxious.       Current Outpatient Medications on File Prior to Visit  Medication Sig Dispense Refill  . ARIPiprazole (ABILIFY) 5 MG tablet Take 5 mg by mouth daily.    [name] Kitchen buPROPion (WELLBUTRIN XL) 150 MG 24 hr tablet TAKE 3 TABLETS IN THE MORNING  0  . lithium carbonate (LITHOBID) 300 MG CR tablet Take 2 tablets (600 mg total) by mouth every evening. (Patient taking differently: Take 1 tablet by mouth in the morning and 2 tablets in the evening.) 60 tablet 0  . LORazepam (ATIVAN) 0.5 MG tablet Take 1 tablet by mouth in the morning and 2 tablets by mouth in the evening.    . zolpidem (AMBIEN) 5 MG tablet Take by mouth.     No current facility-administered medications on file prior to visit.      Past Medical History:  Diagnosis Date  . Anxiety   .  Bipolar 1 disorder (HCC)    HX PSYCHOSIS  . Complication of anesthesia    POST AGGITATION  . Frequency of urination   . Headache(784.0)   . History of duodenal ulcer   . Hyperlipidemia   . IBS (irritable bowel syndrome)   . Nocturia   . Pelvic pain   . Personality disorder  ([place])    W/ BORDERLINE FEATURES  . Urgency of urination    Allergies  Allergen Reactions  . Morphine And Related Nausea And Vomiting  . Duloxetine     Other reaction(s): SWELLING/EDEMA  . Latuda [Lurasidone Hcl]   . Morphine Nausea Only    Social History   Socioeconomic History  . Marital status: Single    Spouse name: None  . Number of children: 0  . Years of education: college  . Highest education level: None  Social Needs  . Financial resource strain: None  . Food insecurity - worry: None  . Food insecurity - inability: None  . Transportation needs - medical: None  . Transportation needs - non-medical: None  Occupational History  . Occupation: N/A    Comment: Nurse  Tobacco Use  . Smoking status: Former Smoker    Packs/day: 0.01    Years: 1.00    Pack years: 0.01    Types: Cigarettes    Last attempt to quit: 08/18/2009    Years since quitting: 8.1  . Smokeless tobacco: Never Used  . Tobacco comment: ONLY SMOKED FOR 6 MONTHS --  QUIT  YRS AGO  Substance and Sexual Activity  . Alcohol use: Yes    Comment: occ  . Drug use: No  . Sexual activity: None  Other Topics Concern  . 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    no tobacco   Caffeine Use: very little, three times a week    Vitals:   10/14/17 1548  BP: 133/82  Pulse: 96  Resp: 12  Temp: 98.7 F (37.1 C)  SpO2: 98%   Body mass index is 29.73 kg/m.   Physical Exam  Nursing note and vitals reviewed. Constitutional: She is oriented to person, place, and time. She appears well-developed. No distress.  HENT:  Head: Normocephalic and atraumatic.  Nose: Right sinus exhibits no maxillary sinus tenderness and no frontal sinus tenderness. Left sinus exhibits no maxillary sinus tenderness and no frontal sinus tenderness.  Mouth/Throat: Oropharynx is  clear and moist and mucous membranes are normal.  Eyes: Conjunctivae and EOM are normal. Pupils are equal, round, and reactive to light.  Cardiovascular: Normal rate and regular rhythm.  No murmur heard. Pulses:      Dorsalis pedis pulses are 2+ on the right side, and 2+ on the left side.  Respiratory: Effort normal and breath sounds normal. No respiratory distress.  GI: Soft. She exhibits no mass. There is no hepatomegaly. There is no tenderness.  Musculoskeletal: She exhibits no edema or tenderness.  Lymphadenopathy:    She has no cervical adenopathy.  Neurological: She is alert and oriented to person, place, and time. She has normal strength. She displays tremor. No cranial nerve deficit.  Reflex Scores:      Patellar reflexes are 2+ on the right side and 2+ on the left side. Unstable gait, not assisted. Mild head and hand tremor, not present at rest.  Skin: Skin  is warm. Ecchymosis noted. No erythema.  Ecchymosis left rib cage. No deformity appreciated.  Psychiatric: She has a normal mood and affect.  Well groomed, good eye contact.    ASSESSMENT AND PLAN:   [name] was seen today for fatigue and wheezing.  Diagnoses and all orders for this visit:  Lab Results  Component Value Date   WBC 6.3 10/14/2017   HGB 13.5 10/14/2017   HCT 39.5 10/14/2017   MCV 93.8 10/14/2017   PLT 321.0 10/14/2017   Lab Results  Component Value Date   CREATININE 0.79 10/14/2017   BUN 7 10/14/2017   NA 142 10/14/2017   K 3.7 10/14/2017   CL 110 10/14/2017   CO2 24 10/14/2017   Lab Results  Component Value Date   TSH 3.07 10/14/2017    Chronic fatigue  We discussed possible etiologies. In her case chronic medical conditions and medications are that most likely aggravating factors.  Examination today does not suggest a serious process.  Further recommendations will be given according to lab results.  Because worsening problem + myalgias, recommend discontinuing Simvastatin for  now.  -     Basic metabolic panel -     CBC -     TSH  Frequent falls  Educated about fall prevention. Some of her medications can also increase risk of falls.  -     Ambulatory referral to Physical Therapy  Unstable gait  PT will be arranged. Further recommendations will be given according to labs results.  -     Basic metabolic panel -     CBC -     Ambulatory referral to Physical Therapy  Encounter for medication monitoring -     Lithium level  Post-traumatic headache, not intractable, unspecified chronicity pattern  Neurologic examination do not suggest acute neurologic process. ? Tension headache. Head CT will be arranged. Instructed about warning signs.  -     CT Head Wo Contrast; Future  Reactive airway disease that is not asthma  Lung auscultation negative. Albuterol inh 2 puff every 6 hours for a week then as needed for wheezing or shortness of breath.  I do not think oral Prednisone is needed at this time.  -     PROAIR HFA 108 (90 Base) MCG/ACT inhaler; Inhale 2 puffs into the lungs every 4 (four) hours as needed for wheezing or shortness of breath. -     DG Chest 2 View; Future  Hematuria, unspecified type  Hx of nephrolithiasis and IC. Currently asymptomatic. Further recommendations will be given according to lab results.  -     Urinalysis, Routine w reflex microscopic  Schizoaffective disorder, bipolar type ([place])  Recommend arranging appt with her psychiatrist.      Return in about 3 weeks (around 11/04/2017).     -LoriRoyetta Crochet Puertas was advised to seek immediate medical attention if sudden worsening symptoms.     Damien Cisar G. [name], MD  [facility]. [place] office.

## 2017-10-15 ENCOUNTER — Encounter: Payer: Self-pay | Admitting: Family Medicine

## 2017-10-15 ENCOUNTER — Inpatient Hospital Stay: Admission: RE | Admit: 2017-10-15 | Payer: Self-pay | Source: Ambulatory Visit

## 2017-10-15 LAB — BASIC METABOLIC PANEL
BUN: 7 mg/dL (ref 6–23)
CHLORIDE: 110 meq/L (ref 96–112)
CO2: 24 meq/L (ref 19–32)
Calcium: 10.4 mg/dL (ref 8.4–10.5)
Creatinine, Ser: 0.79 mg/dL (ref 0.40–1.20)
GFR: 80.08 mL/min (ref >60.00)
Glucose, Bld: 95 mg/dL (ref 70–99)
POTASSIUM: 3.7 meq/L (ref 3.5–5.1)
Sodium: 142 mEq/L (ref 135–145)

## 2017-10-15 LAB — CBC
HEMATOCRIT: 39.5 % (ref 36.0–46.0)
HEMOGLOBIN: 13.5 g/dL (ref 12.0–15.0)
MCHC: 34.2 g/dL (ref 30.0–36.0)
MCV: 93.8 fl (ref 78.0–100.0)
Platelets: 321 10*3/uL (ref 150.0–400.0)
RBC: 4.22 Mil/uL (ref 3.87–5.11)
RDW: 12.5 % (ref 11.5–15.5)
WBC: 6.3 10*3/uL (ref 4.0–10.5)

## 2017-10-15 LAB — URINALYSIS, ROUTINE W REFLEX MICROSCOPIC
BILIRUBIN URINE: NEGATIVE
HGB URINE DIPSTICK: NEGATIVE
Ketones, ur: NEGATIVE
LEUKOCYTES UA: NEGATIVE
NITRITE: NEGATIVE
RBC / HPF: NONE SEEN (ref 0–?)
Specific Gravity, Urine: 1.02 (ref 1.000–1.030)
Total Protein, Urine: NEGATIVE
Urine Glucose: NEGATIVE
Urobilinogen, UA: 0.2 (ref 0.0–1.0)
pH: 7 (ref 5.0–8.0)

## 2017-10-15 LAB — TSH: TSH: 3.07 u[IU]/mL (ref 0.35–4.50)

## 2017-10-15 LAB — LITHIUM LEVEL: LITHIUM LVL: 0.4 mmol/L — AB (ref 0.6–1.2)

## 2017-10-19 ENCOUNTER — Other Ambulatory Visit: Payer: Self-pay

## 2017-10-19 ENCOUNTER — Encounter: Payer: Self-pay | Admitting: Physical Therapy

## 2017-10-19 ENCOUNTER — Ambulatory Visit: Payer: Medicare Other | Attending: Family Medicine | Admitting: Physical Therapy

## 2017-10-19 DIAGNOSIS — R262 Difficulty in walking, not elsewhere classified: Secondary | ICD-10-CM | POA: Diagnosis not present

## 2017-10-19 DIAGNOSIS — R296 Repeated falls: Secondary | ICD-10-CM | POA: Insufficient documentation

## 2017-10-19 DIAGNOSIS — M6281 Muscle weakness (generalized): Secondary | ICD-10-CM | POA: Insufficient documentation

## 2017-10-19 NOTE — Therapy (Signed)
[facility] [address], [place], [place], [address] Phone: [phone]   Fax:  [phone]  Physical Therapy Evaluation  Patient Details  Name: [name] MRN: [id number] Date of Birth: 10-03-1960 Referring Provider: [name], Betty   Encounter Date: 10/19/2017  PT End of Session - 10/19/17 1150    Visit Number  1    Date for PT Re-Evaluation  12/14/17    PT Start Time  1148    PT Stop Time  1225    PT Time Calculation (min)  37 min    Activity Tolerance  Patient tolerated treatment well    Behavior During Therapy  [facility] for tasks assessed/performed       Past Medical History:  Diagnosis Date  . Anxiety   . Bipolar 1 disorder (HCC)    HX PSYCHOSIS  . Complication of anesthesia    POST AGGITATION  . Frequency of urination   . Headache(784.0)   . History of duodenal ulcer   . Hyperlipidemia   . IBS (irritable bowel syndrome)   . Nocturia   . Pelvic pain   . Personality disorder ([place])    W/ BORDERLINE FEATURES  . Urgency of urination     Past Surgical History:  Procedure Laterality Date  . CARDIAC CATHETERIZATION  10-09-1999  DR KATZ   NORMAL LVF/  NORMAL RCA/  NO CRITICAL DISEASE LEFT CORONARY SYSTEM  . CARDIOVASCULAR STRESS TEST  01-23-2011   NORMAL NUCLEAR STUDY/ NO ISCHEMIA/ EF 81%  . CYSTOSCOPY WITH BIOPSY N/A 06/03/2013   Procedure: CYSTOSCOPY WITH BIOPSY  INSTILLATION OF MARCAINE AND PYRIDIUM;  Surgeon: Hanley Ben, MD;  Location: [place];  Service: Urology;  Laterality: N/A;  . LAPAROSCOPIC CHOLECYSTECTOMY  11-08-2001  . LAPAROSCOPIC LEFT SALPINGOOPHORECTOMY AND LYSYS ADHESIONS  03-10-2002  . LAPAROSCOPIC REMOVAL OVARY REMNANT  2003   [place]  . LAPAROSCOPY N/A 12/14/2012   Procedure: LAPAROSCOPY DIAGNOSTIC;  Surgeon: Stark Klein, MD;  Location: [facility];  Service: General;  Laterality: N/A;  . TOTAL ABDOMINAL HYSTERECTOMY  2000   W/ RIGHT SALPINGOOPHORECTOMY  .  TRANSTHORACIC ECHOCARDIOGRAM  10-13-2010   NORMAL LVF/  EF 55-60%    There were no vitals filed for this visit.   Subjective Assessment - 10/19/17 1151    Subjective  Pt presents to clinic due to recent onset of unsteadiness. She reports she fell 5x in the last month.  It has been things like bending forward to reach something on the floor.  States she is less steady when getting out of bed or quick movements.    Limitations  Walking;House hold activities    How long can you stand comfortably?  10 minutes    Patient Stated Goals  feel stronger, be able to walk around the yard    Currently in Pain?  No/denies         [facility] PT Assessment - 10/19/17 0001      Assessment   Medical Diagnosis  R29.6 frequent falls; R26.81 unstable    Referring Provider  [name], Betty    Prior Therapy  No      Precautions   Precautions  None      Restrictions   Weight Bearing Restrictions  No      Balance Screen   Has the patient fallen in the past 6 months  Yes    How many times?  5    Has the patient had a decrease in activity level because of  a fear of falling?   Yes    Is the patient reluctant to leave their home because of a fear of falling?   Yes      [place]  Private residence    Living Arrangements  Spouse/significant other      Prior Function   Level of Independence  Independent    Vocation  On disability      Cognition   Overall Cognitive Status  Within Functional Limits for tasks assessed      Observation/Other Assessments   Focus on Therapeutic Outcomes (FOTO)   30% limited based on berg score and clinical impression      Posture/Postural Control   Posture/Postural Control  Postural limitations    Postural Limitations  Flexed trunk      ROM / Strength   AROM / PROM / Strength  Strength      Strength   Strength Assessment Site  Shoulder;Ankle;Knee;Hip    Right Shoulder Flexion  4-/5    Right Shoulder ABduction  4-/5    Left Shoulder  Flexion  4-/5    Left Shoulder ABduction  4-/5    Right/Left Hip  Right;Left    Right Hip Flexion  4-/5    Right Hip External Rotation   4/5    Right Hip Internal Rotation  4/5    Right Hip ABduction  3+/5    Right Hip ADduction  4/5    Left Hip Flexion  4+/5    Left Hip External Rotation  4+/5    Left Hip Internal Rotation  4+/5    Left Hip ABduction  4-/5    Left Hip ADduction  4/5    Right/Left Knee  Right;Left    Right Knee Flexion  5/5    Right Knee Extension  4+/5    Left Knee Flexion  5/5    Left Knee Extension  4/5    Right/Left Ankle  Right;Left    Right Ankle Dorsiflexion  4-/5    Right Ankle Eversion  4-/5    Left Ankle Dorsiflexion  4-/5    Left Ankle Eversion  4-/5      Transfers   Five time sit to stand comments   11 sec no UE support      Ambulation/Gait   Gait Pattern  Trunk flexed      Standardized Balance Assessment   Standardized Balance Assessment  Berg Balance Test      Berg Balance Test   Sit to Stand  Able to stand without using hands and stabilize independently    Standing Unsupported  Able to stand safely 2 minutes    Sitting with Back Unsupported but Feet Supported on Floor or Stool  Able to sit safely and securely 2 minutes    Stand to Sit  Sits safely with minimal use of hands    Transfers  Able to transfer safely, minor use of hands    Standing Unsupported with Eyes Closed  Able to stand 10 seconds with supervision    Standing Ubsupported with Feet Together  Able to place feet together independently and stand for 1 minute with supervision    From Standing, Reach Forward with Outstretched Arm  Can reach forward >12 cm safely (5")    From Standing Position, Pick up Object from Floor  Able to pick up shoe safely and easily    From Standing Position, Turn to Look Behind Over each Shoulder  Looks behind from both sides and  weight shifts well    Turn 360 Degrees  Able to turn 360 degrees safely in 4 seconds or less    Standing Unsupported,  Alternately Place Feet on Step/Stool  Able to stand independently and safely and complete 8 steps in 20 seconds    Standing Unsupported, One Foot in Front  Able to plae foot ahead of the other independently and hold 30 seconds    Standing on One Leg  Able to lift leg independently and hold equal to or more than 3 seconds    Total Score  50    Berg comment:  increased risk of falls             Objective measurements completed on examination: See above findings.                PT Short Term Goals - 10/19/17 1649      PT SHORT TERM GOAL #1   Title  pt will be ind with initial HEP    Time  4    Period  Weeks    Status  New    Target Date  11/16/17      PT SHORT TERM GOAL #2   Title  pt will demonstrate hip flexion and ankle dorsiflexion 5/5 MMT for improved stability in gait.    Time  4    Period  Weeks    Status  New    Target Date  11/16/17      PT SHORT TERM GOAL #3   Title  Pt will perform 5 x sit to stand < 10 seconds for reduced risk of falls    Time  4    Period  Weeks    Status  New    Target Date  11/16/17        PT Long Term Goals - 10/19/17 1651      PT LONG TERM GOAL #1   Title  ind with advanced HEP    Time  8    Period  Weeks    Status  New    Target Date  12/14/17      PT LONG TERM GOAL #2   Title  Berg balance score increaesd to > or = to 54/56 for reduced risk of falls    Time  8    Period  Weeks    Status  New    Target Date  12/14/17      PT LONG TERM GOAL #3   Title  Pt reports she is able to stand and do chores around the home for > or = to 20 minutes at a time due to improved endurance    Time  8    Period  Weeks    Status  New    Target Date  12/14/17      PT LONG TERM GOAL #4   Title  Pt is able to safely walk around her yard to return to recreational activities with reduced risk of falls    Time  8    Period  Weeks    Status  New    Target Date  12/14/17             Plan - 10/19/17 1626    Clinical  Impression Statement  Pt presents to clinic due to unsteady gait and balance. Pt demonstrates bilateral LE and UE weakness as reported above.  Pt scored 50/56 on Merrilee Jansky demonstrating an increased risk for falls.  Pt performed 5xsit  to stand in 11 seconds which is higher than age releated norms and demonstrates increased risk for falls.  Pt has flexed trunk in standing and walking.  She demonstrates more instability getting up from lying down or turning in circle.  Pt will benefit from skilled PT to address impairments and return to normal activities with reduced risk of falls    History and Personal Factors relevant to plan of care:  bipolar disorder and having side effects from medications, history of falls    Clinical Presentation  Evolving    Clinical Presentation due to:  balance has recently declined    Rehab Potential  Good    PT Frequency  2x / week    PT Duration  8 weeks    PT Treatment/Interventions  ADLs/Self Care Home Management;Therapeutic exercise;Therapeutic activities;Functional mobility training;Stair training;Gait training;Balance training;Neuromuscular re-education;Patient/family education;Manual techniques;Taping;Passive range of motion    PT Next Visit Plan  general strength, endurance, nustep, hip extension, posture, balance    Consulted and Agree with Plan of Care  Patient       Patient will benefit from skilled therapeutic intervention in order to improve the following deficits and impairments:  Difficulty walking, Decreased balance, Decreased activity tolerance, Decreased strength, Postural dysfunction, Abnormal gait  Visit Diagnosis: Muscle weakness (generalized)  Repeated falls  Difficulty in walking, not elsewhere classified     Problem List Patient Active Problem List   Diagnosis Date Noted  . Chronic fatigue 06/21/2017  . Hyperlipidemia, mixed 05/12/2017  . Insomnia 05/12/2017  . Multinodular goiter 05/12/2017  . Hoarseness of voice 05/12/2017  .  Hyperprolactinemia ([place]) 06/26/2015  . Schizoaffective disorder, bipolar type ([place]) 06/21/2015  . Borderline personality disorder ([place]) 06/20/2015  . Lithium toxicity 05/01/2014  . Leukocytosis 05/01/2014  . Costochondritis 02/14/2014  . Renal stone 02/13/2014  . Fatty liver disease, nonalcoholic 41/74/0814  . Abnormal LFTs 04/06/2013  . Dysuria 02/15/2013  . Medication side effect 01/07/2013  . Lumpy breasts 12/20/2012  . Lump of breast, left 12/20/2012  . Loss of weight 11/08/2012  . Food aversion 11/08/2012  . Constipation 05/17/2012  . Right ear pain 02/16/2012  . Jaw pain 02/13/2012  . Vitamin D deficiency 09/23/2011  . Loose stools 09/23/2011  . Double vision 03/15/2011  . Hyperglycemia 01/10/2011  . Dyspnea on effort 01/10/2011  . Palpitations 12/08/2010  . ENDOMETRIOSIS 05/11/2008  . DEFICIENCY, VITAMIN D NOS 06/11/2007  . PLANTAR FASCIITIS 06/11/2007  . ALLERGIC RHINITIS 03/26/2007    Zannie Cove, PT 10/19/2017, 5:03 PM  [facility] [address], [place], [place], [address] Phone: [phone]   Fax:  [phone]  Name: BRANDILYN NANNINGA MRN: [id number] Date of Birth: 12-16-1960

## 2017-10-21 ENCOUNTER — Encounter: Payer: Self-pay | Admitting: Physical Therapy

## 2017-10-21 ENCOUNTER — Ambulatory Visit: Payer: Medicare Other | Admitting: Physical Therapy

## 2017-10-21 DIAGNOSIS — M6281 Muscle weakness (generalized): Secondary | ICD-10-CM

## 2017-10-21 DIAGNOSIS — R262 Difficulty in walking, not elsewhere classified: Secondary | ICD-10-CM | POA: Diagnosis not present

## 2017-10-21 DIAGNOSIS — R296 Repeated falls: Secondary | ICD-10-CM | POA: Diagnosis not present

## 2017-10-21 NOTE — Therapy (Signed)
[facility] [address], [place], [place], [address] Phone: [phone]   Fax:  [phone]  Physical Therapy Treatment  Patient Details  Name: [name] MRN: [id number] Date of Birth: Feb 06, 1961 Referring Provider: [name], Betty   Encounter Date: 10/21/2017  PT End of Session - 10/21/17 1227    Visit Number  2    Date for PT Re-Evaluation  12/14/17    PT Start Time  1227    PT Stop Time  1310    PT Time Calculation (min)  43 min    Activity Tolerance  Patient tolerated treatment well    Behavior During Therapy  [facility] for tasks assessed/performed       Past Medical History:  Diagnosis Date  . Anxiety   . Bipolar 1 disorder (HCC)    HX PSYCHOSIS  . Complication of anesthesia    POST AGGITATION  . Frequency of urination   . Headache(784.0)   . History of duodenal ulcer   . Hyperlipidemia   . IBS (irritable bowel syndrome)   . Nocturia   . Pelvic pain   . Personality disorder ([place])    W/ BORDERLINE FEATURES  . Urgency of urination     Past Surgical History:  Procedure Laterality Date  . CARDIAC CATHETERIZATION  10-09-1999  DR KATZ   NORMAL LVF/  NORMAL RCA/  NO CRITICAL DISEASE LEFT CORONARY SYSTEM  . CARDIOVASCULAR STRESS TEST  01-23-2011   NORMAL NUCLEAR STUDY/ NO ISCHEMIA/ EF 81%  . CYSTOSCOPY WITH BIOPSY N/A 06/03/2013   Procedure: CYSTOSCOPY WITH BIOPSY  INSTILLATION OF MARCAINE AND PYRIDIUM;  Surgeon: Hanley Ben, MD;  Location: [place];  Service: Urology;  Laterality: N/A;  . LAPAROSCOPIC CHOLECYSTECTOMY  11-08-2001  . LAPAROSCOPIC LEFT SALPINGOOPHORECTOMY AND LYSYS ADHESIONS  03-10-2002  . LAPAROSCOPIC REMOVAL OVARY REMNANT  2003   [place]  . LAPAROSCOPY N/A 12/14/2012   Procedure: LAPAROSCOPY DIAGNOSTIC;  Surgeon: Stark Klein, MD;  [facility];  Service: General;  Laterality: N/A;  . TOTAL ABDOMINAL HYSTERECTOMY  2000   W/ RIGHT SALPINGOOPHORECTOMY  .  TRANSTHORACIC ECHOCARDIOGRAM  10-13-2010   NORMAL LVF/  EF 55-60%    There were no vitals filed for this visit.  Subjective Assessment - 10/21/17 1233    Subjective  Pt reports she woke up in the middle of the night and is feeling kind of tired.    Limitations  Walking;House hold activities    How long can you stand comfortably?  10 minutes    Patient Stated Goals  feel stronger, be able to walk around the yard    Currently in Pain?  No/denies                      [facility] Adult PT Treatment/Exercise - 10/21/17 0001      Neuro Re-ed    Neuro Re-ed Details   cues for posture and core engaged in sitting      Knee/Hip Exercises: Stretches   Active Hamstring Stretch  Right;Left;4 reps;10 seconds    Piriformis Stretch  Right;Left;1 rep;60 seconds      Knee/Hip Exercises: Aerobic   Nustep  L1 x 8 min seat 6; UE 7      Knee/Hip Exercises: Seated   Long Arc Quad  Strengthening;Right;Left;10 reps    Clamshell with TheraBand  Green 20x    Marching  Strengthening;Right;Left;20 reps    Abduction/Adduction   Strengthening;Both;2 sets;10 reps ball squeeze  Sit to Sand  2 sets;10 reps;without UE support      Knee/Hip Exercises: Supine   Bridges  Strengthening;Both;2 sets;10 reps    Straight Leg Raises  Strengthening;Right;Left;10 reps             PT Education - 10/21/17 1304    Education provided  Yes    Education Details  initial HEP LE strengthening seated and supine    Person(s) Educated  Patient    Methods  Explanation;Demonstration;Handout;Tactile cues;Verbal cues    Comprehension  Verbalized understanding;Returned demonstration       PT Short Term Goals - 10/19/17 1649      PT SHORT TERM GOAL #1   Title  pt will be ind with initial HEP    Time  4    Period  Weeks    Status  New    Target Date  11/16/17      PT SHORT TERM GOAL #2   Title  pt will demonstrate hip flexion and ankle dorsiflexion 5/5 MMT for improved stability in gait.    Time  4     Period  Weeks    Status  New    Target Date  11/16/17      PT SHORT TERM GOAL #3   Title  Pt will perform 5 x sit to stand < 10 seconds for reduced risk of falls    Time  4    Period  Weeks    Status  New    Target Date  11/16/17        PT Long Term Goals - 10/19/17 1651      PT LONG TERM GOAL #1   Title  ind with advanced HEP    Time  8    Period  Weeks    Status  New    Target Date  12/14/17      PT LONG TERM GOAL #2   Title  Berg balance score increaesd to > or = to 54/56 for reduced risk of falls    Time  8    Period  Weeks    Status  New    Target Date  12/14/17      PT LONG TERM GOAL #3   Title  Pt reports she is able to stand and do chores around the home for > or = to 20 minutes at a time due to improved endurance    Time  8    Period  Weeks    Status  New    Target Date  12/14/17      PT LONG TERM GOAL #4   Title  Pt is able to safely walk around her yard to return to recreational activities with reduced risk of falls    Time  8    Period  Weeks    Status  New    Target Date  12/14/17            Plan - 10/21/17 1311    Clinical Impression Statement  Patient has not made progress yet due to initial visit since eval.  Pt was able to perform exercises correctly with cues and feedback for posture.  Pt needed breaks throughout due to fatigue and decreased activity tolerance.  PT monitored for posture and muscle fasiculations to to cue to take break as needed.    PT Treatment/Interventions  ADLs/Self Care Home Management;Therapeutic exercise;Therapeutic activities;Functional mobility training;Stair training;Gait training;Balance training;Neuromuscular re-education;Patient/family education;Manual techniques;Taping;Passive range of motion    PT  Next Visit Plan  general strength, endurance, nustep, hip extension, posture, balance    PT Home Exercise Plan  add some UE exercises and balance next    Consulted and Agree with Plan of Care  Patient       Patient  will benefit from skilled therapeutic intervention in order to improve the following deficits and impairments:  Difficulty walking, Decreased balance, Decreased activity tolerance, Decreased strength, Postural dysfunction, Abnormal gait  Visit Diagnosis: Muscle weakness (generalized)  Repeated falls  Difficulty in walking, not elsewhere classified     Problem List Patient Active Problem List   Diagnosis Date Noted  . Chronic fatigue 06/21/2017  . Hyperlipidemia, mixed 05/12/2017  . Insomnia 05/12/2017  . Multinodular goiter 05/12/2017  . Hoarseness of voice 05/12/2017  . Hyperprolactinemia ([place]) 06/26/2015  . Schizoaffective disorder, bipolar type ([place]) 06/21/2015  . Borderline personality disorder ([place]) 06/20/2015  . Lithium toxicity 05/01/2014  . Leukocytosis 05/01/2014  . Costochondritis 02/14/2014  . Renal stone 02/13/2014  . Fatty liver disease, nonalcoholic 76/73/4193  . Abnormal LFTs 04/06/2013  . Dysuria 02/15/2013  . Medication side effect 01/07/2013  . Lumpy breasts 12/20/2012  . Lump of breast, left 12/20/2012  . Loss of weight 11/08/2012  . Food aversion 11/08/2012  . Constipation 05/17/2012  . Right ear pain 02/16/2012  . Jaw pain 02/13/2012  . Vitamin D deficiency 09/23/2011  . Loose stools 09/23/2011  . Double vision 03/15/2011  . Hyperglycemia 01/10/2011  . Dyspnea on effort 01/10/2011  . Palpitations 12/08/2010  . ENDOMETRIOSIS 05/11/2008  . DEFICIENCY, VITAMIN D NOS 06/11/2007  . PLANTAR FASCIITIS 06/11/2007  . ALLERGIC RHINITIS 03/26/2007    Zannie Cove, PT 10/21/2017, 1:18 PM  [facility] [address] [place], [place], [address] Phone: [phone]   Fax:  [phone]  Name: [name] MRN: [id number] Date of Birth: 12-24-1960

## 2017-10-21 NOTE — Patient Instructions (Signed)
   LONG ARC QUAD - LAQ  While seated with your knee in a bent position, slowly straighten your knee as you raise your foot upwards as shown.  Sit up tall and engage core when lifting and straightening the leg  Do 2 sets of 10    SIT TO STAND - NO SUPPORT  Start by scooting close to the front of the chair.  Next, lean forward at your trunk and reach forward with your arms and rise to standing without using your hands to push off from the chair or other object.   Use your arms as a counter-balance by reaching forward when in sitting and lower them as you approach standing. Do 2 sets of 10    March legs up and down while keeping core engaged and sitting up tall.  Repeat 20x each side     BRIDGING  While lying on your back with knees bent, tighten your lower abdominals, squeeze your buttocks and then raise your buttocks off the floor/bed as creating a "Bridge" with your body. Hold and then lower yourself and repeat. Do 2 sets of 10 reps   STRAIGHT LEG RAISE - SLR  While lying on your back, raise up your leg with a straight knee.  Keep the opposite knee bent with the foot planted on the ground. Do 2 sets of 10 reps  [facility] [address] [place], [place] [address] Phone # [phone] Fax [phone]

## 2017-10-22 ENCOUNTER — Ambulatory Visit (INDEPENDENT_AMBULATORY_CARE_PROVIDER_SITE_OTHER)
Admission: RE | Admit: 2017-10-22 | Discharge: 2017-10-22 | Disposition: A | Discharge status: Home / Self Care | Payer: Medicare Other | Source: Ambulatory Visit | Attending: Family Medicine | Admitting: Family Medicine

## 2017-10-22 ENCOUNTER — Inpatient Hospital Stay: Admission: RE | Admit: 2017-10-22 | Payer: Self-pay | Source: Ambulatory Visit

## 2017-10-22 DIAGNOSIS — R296 Repeated falls: Secondary | ICD-10-CM | POA: Diagnosis not present

## 2017-10-22 DIAGNOSIS — R0989 Other specified symptoms and signs involving the circulatory and respiratory systems: Secondary | ICD-10-CM | POA: Diagnosis not present

## 2017-10-22 DIAGNOSIS — G44309 Post-traumatic headache, unspecified, not intractable: Secondary | ICD-10-CM

## 2017-10-22 DIAGNOSIS — R05 Cough: Secondary | ICD-10-CM | POA: Diagnosis not present

## 2017-10-22 DIAGNOSIS — R51 Headache: Secondary | ICD-10-CM | POA: Diagnosis not present

## 2017-10-22 DIAGNOSIS — J989 Respiratory disorder, unspecified: Secondary | ICD-10-CM

## 2017-10-25 ENCOUNTER — Encounter: Payer: Self-pay | Admitting: Family Medicine

## 2017-10-26 ENCOUNTER — Ambulatory Visit: Payer: Medicare Other | Admitting: Physical Therapy

## 2017-10-26 ENCOUNTER — Encounter: Payer: Self-pay | Admitting: Physical Therapy

## 2017-10-26 DIAGNOSIS — R296 Repeated falls: Secondary | ICD-10-CM

## 2017-10-26 DIAGNOSIS — M6281 Muscle weakness (generalized): Secondary | ICD-10-CM

## 2017-10-26 DIAGNOSIS — R262 Difficulty in walking, not elsewhere classified: Secondary | ICD-10-CM

## 2017-10-26 NOTE — Therapy (Signed)
[facility] [address], [place], [place], [address] Phone: [phone]   Fax:  [phone]  Physical Therapy Treatment  Patient Details  Name: [name] MRN: [id number] Date of Birth: Aug 13, 1961 Referring Provider: [name], Betty   Encounter Date: 10/26/2017  PT End of Session - 10/26/17 1400    Visit Number  3    Date for PT Re-Evaluation  12/14/17    PT Start Time  1400    PT Stop Time  1439    PT Time Calculation (min)  39 min    Activity Tolerance  Patient tolerated treatment well    Behavior During Therapy  [facility] for tasks assessed/performed       Past Medical History:  Diagnosis Date  . Anxiety   . Bipolar 1 disorder (HCC)    HX PSYCHOSIS  . Complication of anesthesia    POST AGGITATION  . Frequency of urination   . Headache(784.0)   . History of duodenal ulcer   . Hyperlipidemia   . IBS (irritable bowel syndrome)   . Nocturia   . Pelvic pain   . Personality disorder ([place])    W/ BORDERLINE FEATURES  . Urgency of urination     Past Surgical History:  Procedure Laterality Date  . CARDIAC CATHETERIZATION  10-09-1999  DR KATZ   NORMAL LVF/  NORMAL RCA/  NO CRITICAL DISEASE LEFT CORONARY SYSTEM  . CARDIOVASCULAR STRESS TEST  01-23-2011   NORMAL NUCLEAR STUDY/ NO ISCHEMIA/ EF 81%  . CYSTOSCOPY WITH BIOPSY N/A 06/03/2013   Procedure: CYSTOSCOPY WITH BIOPSY  INSTILLATION OF MARCAINE AND PYRIDIUM;  Surgeon: Hanley Ben, MD;  Location: [facility];  Service: Urology;  Laterality: N/A;  . LAPAROSCOPIC CHOLECYSTECTOMY  11-08-2001  . LAPAROSCOPIC LEFT SALPINGOOPHORECTOMY AND LYSYS ADHESIONS  03-10-2002  . LAPAROSCOPIC REMOVAL OVARY REMNANT  2003   [place]  . LAPAROSCOPY N/A 12/14/2012   Procedure: LAPAROSCOPY DIAGNOSTIC;  Surgeon: Stark Klein, MD;  [facility];  Service: General;  Laterality: N/A;  . TOTAL ABDOMINAL HYSTERECTOMY  2000   W/ RIGHT SALPINGOOPHORECTOMY  .  TRANSTHORACIC ECHOCARDIOGRAM  10-13-2010   NORMAL LVF/  EF 55-60%    There were no vitals filed for this visit.  Subjective Assessment - 10/26/17 1402    Subjective  Just weak, no other complaints today.    Limitations  Walking;House hold activities    How long can you stand comfortably?  10 minutes    Patient Stated Goals  feel stronger, be able to walk around the yard    Currently in Pain?  No/denies    Multiple Pain Sites  No                      [facility] Adult PT Treatment/Exercise - 10/26/17 0001      Neuro Re-ed    Neuro Re-ed Details   Tandem stance: small step forward bil 20 sec each      Knee/Hip Exercises: Stretches   Other Knee/Hip Stretches  Lateral lower trunk AROM 20x      Knee/Hip Exercises: Aerobic   Nustep  L1 x 8 min seat 6; UE 7 PTA present for monitoring and status    Other Aerobic  Arm bike: 3 min total  with short rest break  each minute      Knee/Hip Exercises: Standing   Heel Raises  Both;1 set;10 reps VC for quad set    Hip Flexion  Stengthening;Both;1 set;10 reps;Knee  bent      Knee/Hip Exercises: Seated   Long Arc Quad  AROM;Strengthening;Both;1 set;15 reps;Weights    Long Arc Quad Weight  1 lbs.    Clamshell with TheraBand  Green 2x5 hips visably shakey      Knee/Hip Exercises: Supine   Bridges with Greig Right  Strengthening;Both;1 set 6 reps      Shoulder Exercises: Seated   Horizontal ABduction  Strengthening;Both;Theraband 2x5 , VC for posture    Theraband Level (Shoulder Horizontal ABduction)  Level 1 (Yellow)    Flexion  Strengthening;Right;Left;Both;10 reps Overhead, 1#     Other Seated Exercises  Biceps 2# 10x               PT Short Term Goals - 10/26/17 1426      PT SHORT TERM GOAL #1   Title  pt will be ind with initial HEP    Time  4    Period  Weeks    Status  Achieved        PT Long Term Goals - 10/19/17 1651      PT LONG TERM GOAL #1   Title  ind with advanced HEP    Time  8    Period  Weeks     Status  New    Target Date  12/14/17      PT LONG TERM GOAL #2   Title  Berg balance score increaesd to > or = to 54/56 for reduced risk of falls    Time  8    Period  Weeks    Status  New    Target Date  12/14/17      PT LONG TERM GOAL #3   Title  Pt reports she is able to stand and do chores around the home for > or = to 20 minutes at a time due to improved endurance    Time  8    Period  Weeks    Status  New    Target Date  12/14/17      PT LONG TERM GOAL #4   Title  Pt is able to safely walk around her yard to return to recreational activities with reduced risk of falls    Time  8    Period  Weeks    Status  New    Target Date  12/14/17            Plan - 10/26/17 1401    Clinical Impression Statement  Added light weights today for LE & UE strength and endurance. The weights selected were challenging and allowed for proper form/technique. Pt was pretty shakey at the end of her session.  She is also independent in her inittial HEP, meeting goal.     Rehab Potential  Good    PT Frequency  2x / week    PT Duration  8 weeks    PT Treatment/Interventions  ADLs/Self Care Home Management;Therapeutic exercise;Therapeutic activities;Functional mobility training;Stair training;Gait training;Balance training;Neuromuscular re-education;Patient/family education;Manual techniques;Taping;Passive range of motion    PT Next Visit Plan  general strength, endurance, nustep, hip extension, posture, balance    Consulted and Agree with Plan of Care  Patient       Patient will benefit from skilled therapeutic intervention in order to improve the following deficits and impairments:  Difficulty walking, Decreased balance, Decreased activity tolerance, Decreased strength, Postural dysfunction, Abnormal gait  Visit Diagnosis: Muscle weakness (generalized)  Repeated falls  Difficulty in walking, not elsewhere classified  Problem List Patient Active Problem List   Diagnosis Date  Noted  . Chronic fatigue 06/21/2017  . Hyperlipidemia, mixed 05/12/2017  . Insomnia 05/12/2017  . Multinodular goiter 05/12/2017  . Hoarseness of voice 05/12/2017  . Hyperprolactinemia ([place]) 06/26/2015  . Schizoaffective disorder, bipolar type ([place]) 06/21/2015  . Borderline personality disorder ([place]) 06/20/2015  . Lithium toxicity 05/01/2014  . Leukocytosis 05/01/2014  . Costochondritis 02/14/2014  . Renal stone 02/13/2014  . Fatty liver disease, nonalcoholic 99/37/1696  . Abnormal LFTs 04/06/2013  . Dysuria 02/15/2013  . Medication side effect 01/07/2013  . Lumpy breasts 12/20/2012  . Lump of breast, left 12/20/2012  . Loss of weight 11/08/2012  . Food aversion 11/08/2012  . Constipation 05/17/2012  . Right ear pain 02/16/2012  . Jaw pain 02/13/2012  . Vitamin D deficiency 09/23/2011  . Loose stools 09/23/2011  . Double vision 03/15/2011  . Hyperglycemia 01/10/2011  . Dyspnea on effort 01/10/2011  . Palpitations 12/08/2010  . ENDOMETRIOSIS 05/11/2008  . DEFICIENCY, VITAMIN D NOS 06/11/2007  . PLANTAR FASCIITIS 06/11/2007  . ALLERGIC RHINITIS 03/26/2007    Bambie Pizzolato, PTA 10/26/2017, 2:41 PM  [facility] [address] [place], [place], [address] Phone: [phone]   Fax:  [phone]  Name: PATTON SWISHER MRN: [id number] Date of Birth: 1961/05/17

## 2017-10-28 ENCOUNTER — Encounter: Payer: Self-pay | Admitting: Physical Therapy

## 2017-11-02 ENCOUNTER — Encounter: Payer: Self-pay | Admitting: Physical Therapy

## 2017-11-04 ENCOUNTER — Encounter: Payer: Self-pay | Admitting: Physical Therapy

## 2017-11-09 ENCOUNTER — Encounter: Payer: Self-pay | Admitting: Physical Therapy

## 2017-11-11 ENCOUNTER — Encounter: Payer: Self-pay | Admitting: Physical Therapy

## 2017-11-16 ENCOUNTER — Encounter: Payer: Self-pay | Admitting: Physical Therapy

## 2017-11-18 ENCOUNTER — Encounter: Payer: Self-pay | Admitting: Family Medicine

## 2017-11-18 ENCOUNTER — Ambulatory Visit (INDEPENDENT_AMBULATORY_CARE_PROVIDER_SITE_OTHER): Payer: Medicare Other | Admitting: Family Medicine

## 2017-11-18 ENCOUNTER — Ambulatory Visit: Payer: Medicare Other | Attending: Family Medicine | Admitting: Physical Therapy

## 2017-11-18 ENCOUNTER — Encounter: Payer: Self-pay | Admitting: Physical Therapy

## 2017-11-18 VITALS — BP 128/84 | HR 97 | Temp 99.4°F | Resp 12 | Ht 60.0 in | Wt 153.4 lb

## 2017-11-18 DIAGNOSIS — K529 Noninfective gastroenteritis and colitis, unspecified: Secondary | ICD-10-CM | POA: Diagnosis not present

## 2017-11-18 DIAGNOSIS — R110 Nausea: Secondary | ICD-10-CM | POA: Diagnosis not present

## 2017-11-18 DIAGNOSIS — R262 Difficulty in walking, not elsewhere classified: Secondary | ICD-10-CM | POA: Diagnosis not present

## 2017-11-18 DIAGNOSIS — J069 Acute upper respiratory infection, unspecified: Secondary | ICD-10-CM | POA: Diagnosis not present

## 2017-11-18 DIAGNOSIS — R296 Repeated falls: Secondary | ICD-10-CM | POA: Insufficient documentation

## 2017-11-18 DIAGNOSIS — R05 Cough: Secondary | ICD-10-CM | POA: Diagnosis not present

## 2017-11-18 DIAGNOSIS — R059 Cough, unspecified: Secondary | ICD-10-CM

## 2017-11-18 DIAGNOSIS — M6281 Muscle weakness (generalized): Secondary | ICD-10-CM | POA: Insufficient documentation

## 2017-11-18 MED ORDER — ONDANSETRON HCL 4 MG PO TABS: 4.0000 mg | ORAL_TABLET | Freq: Three times a day (TID) | ORAL | 0 refills | Status: DC | PRN

## 2017-11-18 MED ORDER — BENZONATATE 100 MG PO CAPS: 200.0000 mg | ORAL_CAPSULE | Freq: Two times a day (BID) | ORAL | 0 refills | Status: AC | PRN

## 2017-11-18 NOTE — Progress Notes (Signed)
ACUTE VISIT  HPI:  Chief Complaint  Patient presents with  . Chest congestion    sx started last week  . Nausea  . Diarrhea    [name] is a 57 y.o.female here today complaining of 5-7 days of chest congestion. Upper respiratory symptoms associated with diarrhea, nausea, and vomiting.  Nasal congestion,sinus pressure,and sore throat have improved. Vomiting has resolved.  Still nauseated and having diarrhea. Today she has had 3 loose stools, yesterday she had 2. No recent abx use.  Subjective fever and chills.  URI   This is a new problem. The current episode started in the past 7 days. The problem has been gradually improving. Associated symptoms include congestion, coughing, diarrhea, nausea, a plugged ear sensation, rhinorrhea and wheezing ("sometimes"). Pertinent negatives include no abdominal pain, dysuria, ear pain, headaches, rash, sinus pain, sore throat, swollen glands or vomiting. She has tried nothing for the symptoms.    No Hx of recent travel. + Sick contact: Partner and son with similar symptoms. No known insect bite.  Hx of allergies: Yes.  OTC medications for this problem: none  Symptoms otherwise stable.   Review of Systems  Constitutional: Positive for activity change, appetite change, chills, fatigue and fever.  HENT: Positive for congestion, postnasal drip and rhinorrhea. Negative for ear pain, mouth sores, sinus pain, sore throat and trouble swallowing.   Eyes: Negative for discharge, redness and itching.  Respiratory: Positive for cough and wheezing ("sometimes"). Negative for shortness of breath.   Gastrointestinal: Positive for diarrhea and nausea. Negative for abdominal pain and vomiting.  Genitourinary: Negative for decreased urine volume, dysuria and hematuria.  Musculoskeletal: Negative for gait problem and myalgias.  Skin: Negative for rash.  Allergic/Immunologic: Positive for environmental allergies.  Neurological:  Negative for syncope, weakness and headaches.  Hematological: Negative for adenopathy. Does not bruise/bleed easily.  Psychiatric/Behavioral: Positive for sleep disturbance. Negative for confusion. The patient is nervous/anxious.       Current Outpatient Medications on File Prior to Visit  Medication Sig Dispense Refill  . ARIPiprazole (ABILIFY) 5 MG tablet Take 5 mg by mouth daily.    [name] Kitchen buPROPion (WELLBUTRIN XL) 150 MG 24 hr tablet TAKE 3 TABLETS IN THE MORNING  0  . gabapentin (NEURONTIN) 300 MG capsule     . lithium carbonate (LITHOBID) 300 MG CR tablet Take 2 tablets (600 mg total) by mouth every evening. (Patient taking differently: Take 1 tablet by mouth in the morning and 2 tablets in the evening.) 60 tablet 0  . LORazepam (ATIVAN) 0.5 MG tablet Take 1 tablet by mouth in the morning and 2 tablets by mouth in the evening.    [name] Kitchen PROAIR HFA 108 (90 Base) MCG/ACT inhaler Inhale 2 puffs into the lungs every 4 (four) hours as needed for wheezing or shortness of breath. 18 g 2  . zolpidem (AMBIEN) 10 MG tablet Take 10 mg by mouth at bedtime.  2   No current facility-administered medications on file prior to visit.      Past Medical History:  Diagnosis Date  . Anxiety   . Bipolar 1 disorder (HCC)    HX PSYCHOSIS  . Complication of anesthesia    POST AGGITATION  . Frequency of urination   . Headache(784.0)   . History of duodenal ulcer   . Hyperlipidemia   . IBS (irritable bowel syndrome)   . Nocturia   . Pelvic pain   . Personality disorder ([place])    W/  BORDERLINE FEATURES  . Urgency of urination    Allergies  Allergen Reactions  . Morphine And Related Nausea And Vomiting  . Duloxetine     Other reaction(s): SWELLING/EDEMA  . Latuda [Lurasidone Hcl]   . Morphine Nausea Only    Social History   Socioeconomic History  . Marital status: Single    Spouse name: Not on file  . Number of children: 0  . Years of education: college  . Highest education level: Not on file    Occupational History  . Occupation: N/A    Comment: Nurse  Social Needs  . Financial resource strain: Not on file  . Food insecurity:    Worry: Not on file    Inability: Not on file  . Transportation needs:    Medical: Not on file    Non-medical: Not on file  Tobacco Use  . Smoking status: Former Smoker    Packs/day: 0.01    Years: 1.00    Pack years: 0.01    Types: Cigarettes    Last attempt to quit: 08/18/2009    Years since quitting: 8.2  . Smokeless tobacco: Never Used  . Tobacco comment: ONLY SMOKED FOR 6 MONTHS --  QUIT  YRS AGO  Substance and Sexual Activity  . Alcohol use: Yes    Comment: occ  . Drug use: No  . Sexual activity: Not on file  Lifestyle  . Physical activity:    Days per week: Not on file    Minutes per session: Not on file  . Stress: Not on file  Relationships  . Social connections:    Talks on phone: Not on file    Gets together: Not on file    Attends religious service: Not on file    Active member of club or organization: Not on file    Attends meetings of clubs or organizations: Not on file    Relationship status: Not on file  Other Topics Concern  . 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    no tobacco   Caffeine Use: very little, three times a week    Vitals:   11/18/17 1557  BP: 128/84  Pulse: 97  Resp: 12  Temp: 99.4 F (37.4 C)  SpO2: 97%   Body mass index is 29.95 kg/m.   Physical Exam  Nursing note and vitals reviewed. Constitutional: She is oriented to person, place, and time. She appears well-developed. She does not appear ill. No distress.  HENT:  Head: Normocephalic and atraumatic.  Right Ear: Tympanic membrane, external ear and ear canal normal.  Left Ear: Tympanic membrane, external ear and ear canal normal.  Nose: Rhinorrhea present.   Mouth/Throat: Oropharynx is clear and moist and mucous membranes are normal.  Eyes: Conjunctivae are normal.  Cardiovascular: Normal rate and regular rhythm.  No murmur heard. Respiratory: Effort normal and breath sounds normal. No respiratory distress.  GI: Soft. She exhibits no mass. There is no hepatomegaly. There is no tenderness.  Musculoskeletal: She exhibits no edema.  Lymphadenopathy:       Head (right side): No submandibular adenopathy present.       Head (left side): No submandibular adenopathy present.    She has no cervical adenopathy.  Neurological: She is alert and oriented to person, place, and time. She has normal strength.  Gait normal.  Skin: Skin is warm. No rash noted. No erythema.  Psychiatric:  Flat affect. Well groomed, good eye contact.      ASSESSMENT AND PLAN:   Ms. Charie was seen today for chest congestion, nausea and diarrhea.  Diagnoses and all orders for this visit:  Acute gastroenteritis  Most likely viral etiology, so symptomatic treatment recommended for now.Other possible causes discussed but at this time I do not think further work-up is necessary.  Oral hydration, Gatorade or Pedialyte are good options. Bland and light diet if tolerated. Monitor for worsening symptoms. F/U as needed.   Cough  Explained that cough and congestion can last a few weeks. Adequate hydration and OTC Mucinex may help.  -     benzonatate (TESSALON) 100 MG capsule; Take 2 capsules (200 mg total) by mouth 2 (two) times daily as needed for up to 10 days.  Nausea without vomiting  Vomiting has resolved. Adequate hydration. Symptomatic treatment with Zofran recommended.  -     ondansetron (ZOFRAN) 4 MG tablet; Take 1 tablet (4 mg total) by mouth every 8 (eight) hours as needed for nausea or vomiting.  URI, acute  Viral etiology. Lung auscultation today negative for rales or wheezing. Instructed to check temperature when she feels like she has  fever. Follow-up as needed.       Phil Corti G. [name], MD  [facility]. [place] office.

## 2017-11-18 NOTE — Therapy (Signed)
[facility] [address], [place], [place], [address] Phone: [phone]   Fax:  [phone]  Physical Therapy Treatment  Patient Details  Name: [name] MRN: [id number] Date of Birth: 24-Dec-1960 Referring Provider: [name], Betty   Encounter Date: 11/18/2017  PT End of Session - 11/18/17 1420    Visit Number  4    Date for PT Re-Evaluation  12/14/17    PT Start Time  1416 low level tolerance today    PT Stop Time  1450    PT Time Calculation (min)  34 min    Activity Tolerance  Patient tolerated treatment well    Behavior During Therapy  [facility] for tasks assessed/performed       Past Medical History:  Diagnosis Date  . Anxiety   . Bipolar 1 disorder (HCC)    HX PSYCHOSIS  . Complication of anesthesia    POST AGGITATION  . Frequency of urination   . Headache(784.0)   . History of duodenal ulcer   . Hyperlipidemia   . IBS (irritable bowel syndrome)   . Nocturia   . Pelvic pain   . Personality disorder ([place])    W/ BORDERLINE FEATURES  . Urgency of urination     Past Surgical History:  Procedure Laterality Date  . CARDIAC CATHETERIZATION  10-09-1999  DR KATZ   NORMAL LVF/  NORMAL RCA/  NO CRITICAL DISEASE LEFT CORONARY SYSTEM  . CARDIOVASCULAR STRESS TEST  01-23-2011   NORMAL NUCLEAR STUDY/ NO ISCHEMIA/ EF 81%  . CYSTOSCOPY WITH BIOPSY N/A 06/03/2013   Procedure: CYSTOSCOPY WITH BIOPSY  INSTILLATION OF MARCAINE AND PYRIDIUM;  Surgeon: Hanley Ben, MD;  Location: [facility];  Service: Urology;  Laterality: N/A;  . LAPAROSCOPIC CHOLECYSTECTOMY  11-08-2001  . LAPAROSCOPIC LEFT SALPINGOOPHORECTOMY AND LYSYS ADHESIONS  03-10-2002  . LAPAROSCOPIC REMOVAL OVARY REMNANT  2003   [place]  . LAPAROSCOPY N/A 12/14/2012   Procedure: LAPAROSCOPY DIAGNOSTIC;  Surgeon: Stark Klein, MD;  [facility];  Service: General;  Laterality: N/A;  . TOTAL ABDOMINAL HYSTERECTOMY  2000   W/ RIGHT  SALPINGOOPHORECTOMY  . TRANSTHORACIC ECHOCARDIOGRAM  10-13-2010   NORMAL LVF/  EF 55-60%    There were no vitals filed for this visit.  Subjective Assessment - 11/18/17 1421    Subjective  I was at the beach, that is why I have not been here in awhile. I am going to my PCP to get my chest looked at. I have congestion and pain in my back when I cough.     Currently in Pain?  No/denies    Multiple Pain Sites  No                       [facility] Adult PT Treatment/Exercise - 11/18/17 0001      Neuro Re-ed    Neuro Re-ed Details   Tandem stance 20 sec bil, narrow BOS 30 sec 2x       Knee/Hip Exercises: Aerobic   Nustep  L1 x 8 min seat 6; UE 7 PTA present for monitoring and status    Other Aerobic  Arm bike L1 x 3 min No rest breaks needed      Knee/Hip Exercises: Standing   Heel Raises  Both;1 set;15 reps      Knee/Hip Exercises: Seated   Long Arc Quad  Strengthening;Both;1 set;10 reps;Weights    Long Arc Quad Weight  2 lbs.    Clamshell  with TheraBand  Green 2x8 hips visably shakey    Marching  Strengthening;Both;1 set;20 reps;Weights    Marching Limitations  2      Shoulder Exercises: Seated   Other Seated Exercises  Biceps 2# 10x2               PT Short Term Goals - 10/26/17 1426      PT SHORT TERM GOAL #1   Title  pt will be ind with initial HEP    Time  4    Period  Weeks    Status  Achieved        PT Long Term Goals - 11/18/17 1423      PT LONG TERM GOAL #3   Title  Pt reports she is able to stand and do chores around the home for > or = to 20 minutes at a time due to improved endurance    Time  8    Period  Weeks    Status  On-going 5-10 min      PT LONG TERM GOAL #4   Title  Pt is able to safely walk around her yard to return to recreational activities with reduced risk of falls    Time  8    Period  Weeks    Status  On-going            Plan - 11/18/17 1420    Clinical Impression Statement  Pt has missed about 3 weeks of  PT. She went out of town to [place] where she reports "doing nothing." She tolerates basic exercises both seated and standing. We were able to make small increases in loads and reps/sets today with moderate fatigue at the end of the session.     Rehab Potential  Good    PT Frequency  2x / week    PT Duration  8 weeks    PT Treatment/Interventions  ADLs/Self Care Home Management;Therapeutic exercise;Therapeutic activities;Functional mobility training;Stair training;Gait training;Balance training;Neuromuscular re-education;Patient/family education;Manual techniques;Taping;Passive range of motion    PT Next Visit Plan  general strength, endurance, nustep, hip extension, posture, balance    Consulted and Agree with Plan of Care  Patient       Patient will benefit from skilled therapeutic intervention in order to improve the following deficits and impairments:  Difficulty walking, Decreased balance, Decreased activity tolerance, Decreased strength, Postural dysfunction, Abnormal gait  Visit Diagnosis: Muscle weakness (generalized)  Repeated falls  Difficulty in walking, not elsewhere classified     Problem List Patient Active Problem List   Diagnosis Date Noted  . Chronic fatigue 06/21/2017  . Hyperlipidemia, mixed 05/12/2017  . Insomnia 05/12/2017  . Multinodular goiter 05/12/2017  . Hoarseness of voice 05/12/2017  . Hyperprolactinemia ([place]) 06/26/2015  . Schizoaffective disorder, bipolar type ([place]) 06/21/2015  . Borderline personality disorder ([place]) 06/20/2015  . Lithium toxicity 05/01/2014  . Leukocytosis 05/01/2014  . Costochondritis 02/14/2014  . Renal stone 02/13/2014  . Fatty liver disease, nonalcoholic 32/99/2426  . Abnormal LFTs 04/06/2013  . Dysuria 02/15/2013  . Medication side effect 01/07/2013  . Lumpy breasts 12/20/2012  . Lump of breast, left 12/20/2012  . Loss of weight 11/08/2012  . Food aversion 11/08/2012  . Constipation 05/17/2012  . Right ear pain  02/16/2012  . Jaw pain 02/13/2012  . Vitamin D deficiency 09/23/2011  . Loose stools 09/23/2011  . Double vision 03/15/2011  . Hyperglycemia 01/10/2011  . Dyspnea on effort 01/10/2011  . Palpitations 12/08/2010  . ENDOMETRIOSIS 05/11/2008  .  DEFICIENCY, VITAMIN D NOS 06/11/2007  . PLANTAR FASCIITIS 06/11/2007  . ALLERGIC RHINITIS 03/26/2007    Josiel Gahm, PTA 11/18/2017, 3:03 PM  [facility] [address] [place], [place], [address] Phone: [phone]   Fax:  [phone]  Name: KRISSA UTKE MRN: [id number] Date of Birth: 11/01/1960

## 2017-11-18 NOTE — Patient Instructions (Signed)
A few things to remember from today's visit:   Cough - Plan: benzonatate (TESSALON) 100 MG capsule  Acute gastroenteritis  Nausea without vomiting - Plan: ondansetron (ZOFRAN) 4 MG tablet    Symptoms are most likely viral and self-limited. Good hand hygiene is important, decrease risk of transmission. Small and frequent sips of clear fluids, Pedialyte or Gatorade age good options.  BRAT diet: bananas, rice, applesauce, and toast.  If vomiting stop solids for 24 hours then resume liquid/soft diet and advance as tolerated.   Notified immediately or seek medical attention if you cannot keep fluids down, decreased urine output, severe abdominal pain, red bright blood per rectum, or changes in mental status.  Follow if not full recovery in 2-3 weeks.        Please be sure medication list is accurate. If a new problem present, please set up appointment sooner than planned today.

## 2017-11-19 ENCOUNTER — Encounter: Payer: Self-pay | Admitting: Family Medicine

## 2017-11-22 ENCOUNTER — Other Ambulatory Visit: Payer: Self-pay | Admitting: Family Medicine

## 2017-11-23 ENCOUNTER — Encounter: Payer: Self-pay | Admitting: Physical Therapy

## 2017-11-25 ENCOUNTER — Ambulatory Visit: Payer: Medicare Other | Admitting: Physical Therapy

## 2017-11-25 ENCOUNTER — Encounter: Payer: Self-pay | Admitting: Physical Therapy

## 2017-11-25 DIAGNOSIS — M6281 Muscle weakness (generalized): Secondary | ICD-10-CM

## 2017-11-25 DIAGNOSIS — R296 Repeated falls: Secondary | ICD-10-CM

## 2017-11-25 DIAGNOSIS — R262 Difficulty in walking, not elsewhere classified: Secondary | ICD-10-CM

## 2017-11-25 NOTE — Therapy (Signed)
[facility] [address] [place], [place], [address] Phone: [phone]   Fax:  [phone]  Physical Therapy Treatment  Patient Details  Name: [name] MRN: [id number] Date of Birth: 1961/02/09 Referring Provider: [name], Betty   Encounter Date: 11/25/2017  PT End of Session - 11/25/17 1402    Visit Number  5    Date for PT Re-Evaluation  12/14/17    PT Start Time  1402    PT Stop Time  1441    PT Time Calculation (min)  39 min    Activity Tolerance  Patient tolerated treatment well    Behavior During Therapy  [facility] for tasks assessed/performed       Past Medical History:  Diagnosis Date  . Anxiety   . Bipolar 1 disorder (HCC)    HX PSYCHOSIS  . Complication of anesthesia    POST AGGITATION  . Frequency of urination   . Headache(784.0)   . History of duodenal ulcer   . Hyperlipidemia   . IBS (irritable bowel syndrome)   . Nocturia   . Pelvic pain   . Personality disorder ([place])    W/ BORDERLINE FEATURES  . Urgency of urination     Past Surgical History:  Procedure Laterality Date  . CARDIAC CATHETERIZATION  10-09-1999  DR KATZ   NORMAL LVF/  NORMAL RCA/  NO CRITICAL DISEASE LEFT CORONARY SYSTEM  . CARDIOVASCULAR STRESS TEST  01-23-2011   NORMAL NUCLEAR STUDY/ NO ISCHEMIA/ EF 81%  . CYSTOSCOPY WITH BIOPSY N/A 06/03/2013   Procedure: CYSTOSCOPY WITH BIOPSY  INSTILLATION OF MARCAINE AND PYRIDIUM;  Surgeon: Hanley Ben, MD;  Location: [place];  Service: Urology;  Laterality: N/A;  . LAPAROSCOPIC CHOLECYSTECTOMY  11-08-2001  . LAPAROSCOPIC LEFT SALPINGOOPHORECTOMY AND LYSYS ADHESIONS  03-10-2002  . LAPAROSCOPIC REMOVAL OVARY REMNANT  2003   [place]  . LAPAROSCOPY N/A 12/14/2012   Procedure: LAPAROSCOPY DIAGNOSTIC;  Surgeon: Stark Klein, MD;  [facility];  Service: General;  Laterality: N/A;  . TOTAL ABDOMINAL HYSTERECTOMY  2000   W/ RIGHT SALPINGOOPHORECTOMY  .  TRANSTHORACIC ECHOCARDIOGRAM  10-13-2010   NORMAL LVF/  EF 55-60%    There were no vitals filed for this visit.  Subjective Assessment - 11/25/17 1407    Subjective  I just am not sleeping well at night, I am staying up too late and then I have no energy during the day. I felt fine after the last PT session.     Limitations  Walking;House hold activities    How long can you stand comfortably?  10 minutes    Patient Stated Goals  feel stronger, be able to walk around the yard    Currently in Pain?  No/denies    Multiple Pain Sites  No         [facility] PT Assessment - 11/25/17 0001      Strength   Right Shoulder ABduction  4+/5    Left Shoulder Flexion  4+/5    Left Shoulder ABduction  4+/5    Right Hip Flexion  4+/5    Right Hip ABduction  4-/5    Left Hip Flexion  4+/5    Left Hip ABduction  4+/5                   [facility] Adult PT Treatment/Exercise - 11/25/17 0001      Knee/Hip Exercises: Aerobic   Nustep  L1-2 x 8 min PTA present for  status and goal update.       Knee/Hip Exercises: Standing   Hip Abduction  Stengthening;Both;1 set;10 reps;Knee straight    Abduction Limitations  2#      Knee/Hip Exercises: Seated   Long Arc Quad  Strengthening;Both;2 sets;10 reps;Weights    Long Arc Quad Weight  2 lbs.    Sit to Sand  -- 10 sec test" pt completed 6x             PT Education - 11/25/17 1426    Education provided  Yes    Education Details  HEP progression sit to stand    Person(s) Educated  Patient    Methods  Explanation;Demonstration;Tactile cues;Verbal cues;Handout    Comprehension  Verbalized understanding;Returned demonstration       PT Short Term Goals - 11/25/17 1423      PT SHORT TERM GOAL #3   Title  Pt will perform 5 x sit to stand < 10 seconds for reduced risk of falls    Time  4    Period  Weeks    Status  Achieved 6x        PT Long Term Goals - 11/18/17 1423      PT LONG TERM GOAL #3   Title  Pt reports she is able to stand  and do chores around the home for > or = to 20 minutes at a time due to improved endurance    Time  8    Period  Weeks    Status  On-going 5-10 min      PT LONG TERM GOAL #4   Title  Pt is able to safely walk around her yard to return to recreational activities with reduced risk of falls    Time  8    Period  Weeks    Status  On-going            Plan - 11/25/17 1402    Clinical Impression Statement  Pt only able to come 1x this week. She struggles on days where she feels a lot of fatgue. She improved in all her low scoring MMT from the eval as well as meeting her goal for sit to stand. She was able to perfrom 6 sit to stands in 10 sec.  Pt generally needs supervision to keep her on task with the exercise or count.  She does not experience any pain when exercising. Today we tried adding light ankle weights to her exercises to strengthen her LE. She was able to handle the increased weight well.  Added to the HEP today and encouraged pt to do her HEP more often, to shoot for daily.     Rehab Potential  Good    PT Frequency  2x / week    PT Duration  8 weeks    PT Treatment/Interventions  ADLs/Self Care Home Management;Therapeutic exercise;Therapeutic activities;Functional mobility training;Stair training;Gait training;Balance training;Neuromuscular re-education;Patient/family education;Manual techniques;Taping;Passive range of motion    PT Next Visit Plan  general strength, endurance, nustep, hip extension, posture, balance    Consulted and Agree with Plan of Care  Patient       Patient will benefit from skilled therapeutic intervention in order to improve the following deficits and impairments:  Difficulty walking, Decreased balance, Decreased activity tolerance, Decreased strength, Postural dysfunction, Abnormal gait  Visit Diagnosis: Muscle weakness (generalized)  Repeated falls  Difficulty in walking, not elsewhere classified     Problem List Patient Active Problem List    Diagnosis Date  Noted  . Chronic fatigue 06/21/2017  . Hyperlipidemia, mixed 05/12/2017  . Insomnia 05/12/2017  . Multinodular goiter 05/12/2017  . Hoarseness of voice 05/12/2017  . Hyperprolactinemia ([place]) 06/26/2015  . Schizoaffective disorder, bipolar type ([place]) 06/21/2015  . Borderline personality disorder ([place]) 06/20/2015  . Lithium toxicity 05/01/2014  . Leukocytosis 05/01/2014  . Costochondritis 02/14/2014  . Renal stone 02/13/2014  . Fatty liver disease, nonalcoholic 19/75/8832  . Abnormal LFTs 04/06/2013  . Dysuria 02/15/2013  . Medication side effect 01/07/2013  . Lumpy breasts 12/20/2012  . Lump of breast, left 12/20/2012  . Loss of weight 11/08/2012  . Food aversion 11/08/2012  . Constipation 05/17/2012  . Right ear pain 02/16/2012  . Jaw pain 02/13/2012  . Vitamin D deficiency 09/23/2011  . Loose stools 09/23/2011  . Double vision 03/15/2011  . Hyperglycemia 01/10/2011  . Dyspnea on effort 01/10/2011  . Palpitations 12/08/2010  . ENDOMETRIOSIS 05/11/2008  . DEFICIENCY, VITAMIN D NOS 06/11/2007  . PLANTAR FASCIITIS 06/11/2007  . ALLERGIC RHINITIS 03/26/2007    Lakechia Nay, PTA 11/25/2017, 2:48 PM  [facility] [address] [place], [place], [address] Phone: [phone]   Fax:  [phone]  Name: [name] MRN: [id number] Date of Birth: 1961-03-15

## 2017-11-25 NOTE — Patient Instructions (Addendum)
Functional Quadriceps: Sit to Stand    Sit on edge of chair, feet flat on floor. Stand upright slowly, extending knees fully. Repeat _5-10___ times per set. Do _1___ sets per session. Do _3-4___ sessions per day.  http://orth.exer.us/735   Copyright  VHI. All rights reserved.    Quad Strengthening    Tighten muscle in top of thigh and straighten out knee. Hold _5___ seconds, counting out loud. Repeat with other leg. Repeat _10___ times. Do _3___ sessions per day. You can get 2# ankle weights for home. With the weights you do not have to hold. You can lift slowly and smoothly.  http://gt2.exer.us/468   Copyright  VHI. All rights reserved.

## 2017-11-30 ENCOUNTER — Ambulatory Visit: Payer: Medicare Other | Admitting: Physical Therapy

## 2017-11-30 ENCOUNTER — Encounter: Payer: Self-pay | Admitting: Physical Therapy

## 2017-11-30 DIAGNOSIS — M6281 Muscle weakness (generalized): Secondary | ICD-10-CM | POA: Diagnosis not present

## 2017-11-30 DIAGNOSIS — R296 Repeated falls: Secondary | ICD-10-CM

## 2017-11-30 DIAGNOSIS — R262 Difficulty in walking, not elsewhere classified: Secondary | ICD-10-CM

## 2017-11-30 NOTE — Therapy (Signed)
[facility] [address] [place], [place], [address] Phone: [phone]   Fax:  [phone]  Physical Therapy Treatment  Patient Details  Name: [name] MRN: [id number] Date of Birth: 11-03-1960 Referring Provider: [name], Betty   Encounter Date: 11/30/2017  PT End of Session - 11/30/17 1408    Visit Number  6    Date for PT Re-Evaluation  12/14/17    PT Start Time  1404    PT Stop Time  1440    PT Time Calculation (min)  36 min    Activity Tolerance  Patient tolerated treatment well;Patient limited by lethargy    Behavior During Therapy  [facility] for tasks assessed/performed       Past Medical History:  Diagnosis Date  . Anxiety   . Bipolar 1 disorder (HCC)    HX PSYCHOSIS  . Complication of anesthesia    POST AGGITATION  . Frequency of urination   . Headache(784.0)   . History of duodenal ulcer   . Hyperlipidemia   . IBS (irritable bowel syndrome)   . Nocturia   . Pelvic pain   . Personality disorder ([place])    W/ BORDERLINE FEATURES  . Urgency of urination     Past Surgical History:  Procedure Laterality Date  . CARDIAC CATHETERIZATION  10-09-1999  DR KATZ   NORMAL LVF/  NORMAL RCA/  NO CRITICAL DISEASE LEFT CORONARY SYSTEM  . CARDIOVASCULAR STRESS TEST  01-23-2011   NORMAL NUCLEAR STUDY/ NO ISCHEMIA/ EF 81%  . CYSTOSCOPY WITH BIOPSY N/A 06/03/2013   Procedure: CYSTOSCOPY WITH BIOPSY  INSTILLATION OF MARCAINE AND PYRIDIUM;  Surgeon: Hanley Ben, MD;  Location: [place];  Service: Urology;  Laterality: N/A;  . LAPAROSCOPIC CHOLECYSTECTOMY  11-08-2001  . LAPAROSCOPIC LEFT SALPINGOOPHORECTOMY AND LYSYS ADHESIONS  03-10-2002  . LAPAROSCOPIC REMOVAL OVARY REMNANT  2003   [place]  . LAPAROSCOPY N/A 12/14/2012   Procedure: LAPAROSCOPY DIAGNOSTIC;  Surgeon: Stark Klein, MD;  [facility];  Service: General;  Laterality: N/A;  . TOTAL ABDOMINAL HYSTERECTOMY  2000   W/  RIGHT SALPINGOOPHORECTOMY  . TRANSTHORACIC ECHOCARDIOGRAM  10-13-2010   NORMAL LVF/  EF 55-60%    There were no vitals filed for this visit.  Subjective Assessment - 11/30/17 1410    Subjective  I am tired, feel heavy and drugged.    Limitations  Walking;House hold activities    How long can you stand comfortably?  10 minutes    Patient Stated Goals  feel stronger, be able to walk around the yard    Currently in Pain?  No/denies    Multiple Pain Sites  No                       [facility] Adult PT Treatment/Exercise - 11/30/17 0001      Knee/Hip Exercises: Aerobic   Stationary Bike  L0 x 5 min slow PTA monitored pt status closely    Nustep  L2 x 9 min PTA present to monitor      Knee/Hip Exercises: Seated   Long Arc Quad  Strengthening;Both;2 sets;10 reps;Weights    Long Arc Quad Weight  2 lbs.    Ball Squeeze  15x LE trremble    Clamshell with TheraBand  Green 2x10 hips visably shakey    Marching  Strengthening;Both;1 set;20 reps;Weights    Marching Limitations  2  PT Short Term Goals - 11/25/17 1423      PT SHORT TERM GOAL #3   Title  Pt will perform 5 x sit to stand < 10 seconds for reduced risk of falls    Time  4    Period  Weeks    Status  Achieved 6x        PT Long Term Goals - 11/18/17 1423      PT LONG TERM GOAL #3   Title  Pt reports she is able to stand and do chores around the home for > or = to 20 minutes at a time due to improved endurance    Time  8    Period  Weeks    Status  On-going 5-10 min      PT LONG TERM GOAL #4   Title  Pt is able to safely walk around her yard to return to recreational activities with reduced risk of falls    Time  8    Period  Weeks    Status  On-going            Plan - 11/30/17 1409    Clinical Impression Statement  Pt presented today very lethargic. She even described how she was feeling as "drugged." She maintained her current level of exercise, some exercises she was even  able to add a second set  with proper rest.  Pt voiced concern of an underlying medical condition making her feel so lethargic.  PTA encouraged her to reach out to her MD and have a discussion about her concerns.  She reposts she gets a short burst of energy after her PT but it only lasts for a short while.     Rehab Potential  Good    PT Frequency  2x / week    PT Duration  8 weeks    PT Treatment/Interventions  ADLs/Self Care Home Management;Therapeutic exercise;Therapeutic activities;Functional mobility training;Stair training;Gait training;Balance training;Neuromuscular re-education;Patient/family education;Manual techniques;Taping;Passive range of motion    PT Next Visit Plan  general strength, endurance, nustep, hip extension, posture, balance    PT Home Exercise Plan  add some UE exercises and balance next    Consulted and Agree with Plan of Care  Patient       Patient will benefit from skilled therapeutic intervention in order to improve the following deficits and impairments:  Difficulty walking, Decreased balance, Decreased activity tolerance, Decreased strength, Postural dysfunction, Abnormal gait  Visit Diagnosis: Muscle weakness (generalized)  Repeated falls  Difficulty in walking, not elsewhere classified     Problem List Patient Active Problem List   Diagnosis Date Noted  . Chronic fatigue 06/21/2017  . Hyperlipidemia, mixed 05/12/2017  . Insomnia 05/12/2017  . Multinodular goiter 05/12/2017  . Hoarseness of voice 05/12/2017  . Hyperprolactinemia ([place]) 06/26/2015  . Schizoaffective disorder, bipolar type ([place]) 06/21/2015  . Borderline personality disorder ([place]) 06/20/2015  . Lithium toxicity 05/01/2014  . Leukocytosis 05/01/2014  . Costochondritis 02/14/2014  . Renal stone 02/13/2014  . Fatty liver disease, nonalcoholic 57/84/6962  . Abnormal LFTs 04/06/2013  . Dysuria 02/15/2013  . Medication side effect 01/07/2013  . Lumpy breasts 12/20/2012  . Lump of  breast, left 12/20/2012  . Loss of weight 11/08/2012  . Food aversion 11/08/2012  . Constipation 05/17/2012  . Right ear pain 02/16/2012  . Jaw pain 02/13/2012  . Vitamin D deficiency 09/23/2011  . Loose stools 09/23/2011  . Double vision 03/15/2011  . Hyperglycemia 01/10/2011  . Dyspnea on effort  01/10/2011  . Palpitations 12/08/2010  . ENDOMETRIOSIS 05/11/2008  . DEFICIENCY, VITAMIN D NOS 06/11/2007  . PLANTAR FASCIITIS 06/11/2007  . ALLERGIC RHINITIS 03/26/2007    COCHRAN,JENNIFER, PTA 11/30/2017, 2:41 PM   [facility] [address], [place], [place], [address] Phone: [phone]   Fax:  [phone]  Name: [name] MRN: [id number] Date of Birth: Aug 22, 1960

## 2017-12-02 ENCOUNTER — Encounter: Payer: Self-pay | Admitting: Physical Therapy

## 2017-12-02 ENCOUNTER — Ambulatory Visit: Payer: Medicare Other | Admitting: Physical Therapy

## 2017-12-02 DIAGNOSIS — R262 Difficulty in walking, not elsewhere classified: Secondary | ICD-10-CM

## 2017-12-02 DIAGNOSIS — R296 Repeated falls: Secondary | ICD-10-CM

## 2017-12-02 DIAGNOSIS — F3131 Bipolar disorder, current episode depressed, mild: Secondary | ICD-10-CM | POA: Diagnosis not present

## 2017-12-02 DIAGNOSIS — M6281 Muscle weakness (generalized): Secondary | ICD-10-CM | POA: Diagnosis not present

## 2017-12-02 NOTE — Therapy (Signed)
[facility] [address] [place], [place], [address] Phone: [phone]   Fax:  [phone]  Physical Therapy Treatment  Patient Details  Name: [name] MRN: [id number] Date of Birth: 07/22/1961 Referring Provider: [name], Betty   Encounter Date: 12/02/2017  PT End of Session - 12/02/17 1406    Visit Number  7    Date for PT Re-Evaluation  12/14/17    PT Start Time  6606    PT Stop Time  1445    PT Time Calculation (min)  40 min    Activity Tolerance  Patient tolerated treatment well    Behavior During Therapy  [facility] for tasks assessed/performed       Past Medical History:  Diagnosis Date  . Anxiety   . Bipolar 1 disorder (HCC)    HX PSYCHOSIS  . Complication of anesthesia    POST AGGITATION  . Frequency of urination   . Headache(784.0)   . History of duodenal ulcer   . Hyperlipidemia   . IBS (irritable bowel syndrome)   . Nocturia   . Pelvic pain   . Personality disorder ([place])    W/ BORDERLINE FEATURES  . Urgency of urination     Past Surgical History:  Procedure Laterality Date  . CARDIAC CATHETERIZATION  10-09-1999  DR KATZ   NORMAL LVF/  NORMAL RCA/  NO CRITICAL DISEASE LEFT CORONARY SYSTEM  . CARDIOVASCULAR STRESS TEST  01-23-2011   NORMAL NUCLEAR STUDY/ NO ISCHEMIA/ EF 81%  . CYSTOSCOPY WITH BIOPSY N/A 06/03/2013   Procedure: CYSTOSCOPY WITH BIOPSY  INSTILLATION OF MARCAINE AND PYRIDIUM;  Surgeon: Hanley Ben, MD;  Location: [place];  Service: Urology;  Laterality: N/A;  . LAPAROSCOPIC CHOLECYSTECTOMY  11-08-2001  . LAPAROSCOPIC LEFT SALPINGOOPHORECTOMY AND LYSYS ADHESIONS  03-10-2002  . LAPAROSCOPIC REMOVAL OVARY REMNANT  2003   [place]  . LAPAROSCOPY N/A 12/14/2012   Procedure: LAPAROSCOPY DIAGNOSTIC;  Surgeon: Stark Klein, MD;  [facility];  Service: General;  Laterality: N/A;  . TOTAL ABDOMINAL HYSTERECTOMY  2000   W/ RIGHT SALPINGOOPHORECTOMY  .  TRANSTHORACIC ECHOCARDIOGRAM  10-13-2010   NORMAL LVF/  EF 55-60%    There were no vitals filed for this visit.  Subjective Assessment - 12/02/17 1408    Subjective  "I'm hanging in there."    Limitations  Walking;House hold activities    How long can you stand comfortably?  10 minutes    Patient Stated Goals  feel stronger, be able to walk around the yard    Currently in Pain?  No/denies    Multiple Pain Sites  No                       [facility] Adult PT Treatment/Exercise - 12/02/17 0001      Neuro Re-ed    Neuro Re-ed Details   Quick stop/start 30 feet 2x, CGA: tandem walking 28ft 2x CGA      Knee/Hip Exercises: Aerobic   Nustep  L2 x 10 min PTA present for staus/updates      Knee/Hip Exercises: Standing   Walking with Sports Cord  20# 6x each direction CGA for safety, VC for bigger steps      Knee/Hip Exercises: Seated   Long Arc Quad  Strengthening;Both;2 sets;10 reps;Weights    Long Arc Quad Weight  2 lbs.    Clamshell with TheraBand  Green 2x10 hips visably shakey    Marching  Strengthening;Right;Both;1 set;10  reps;Weights    Marching Limitations  2               PT Short Term Goals - 11/25/17 1423      PT SHORT TERM GOAL #3   Title  Pt will perform 5 x sit to stand < 10 seconds for reduced risk of falls    Time  4    Period  Weeks    Status  Achieved 6x        PT Long Term Goals - 11/18/17 1423      PT LONG TERM GOAL #3   Title  Pt reports she is able to stand and do chores around the home for > or = to 20 minutes at a time due to improved endurance    Time  8    Period  Weeks    Status  On-going 5-10 min      PT LONG TERM GOAL #4   Title  Pt is able to safely walk around her yard to return to recreational activities with reduced risk of falls    Time  8    Period  Weeks    Status  On-going            Plan - 12/02/17 1407    Clinical Impression Statement  Pt presented today with more energy, not so lethargic. She reports  hving a sesion wither therapists this AM where they were able to discuss specific stressors that had been bothering her.  She felt it was very productive. Today we were able to increase the duration and difficulty of her exercises.  Today we added more balance challenges to her program. Her comfort level is small steps, she was encouraged to work on taking bigger steps throughtout the session today. She did not seem too fatigued at the end of session.     Rehab Potential  Good    PT Frequency  2x / week    PT Duration  8 weeks    PT Treatment/Interventions  ADLs/Self Care Home Management;Therapeutic exercise;Therapeutic activities;Functional mobility training;Stair training;Gait training;Balance training;Neuromuscular re-education;Patient/family education;Manual techniques;Taping;Passive range of motion    PT Next Visit Plan  general strength, endurance, nustep, hip extension, posture, balance, MMt for goals next week.     Consulted and Agree with Plan of Care  Patient       Patient will benefit from skilled therapeutic intervention in order to improve the following deficits and impairments:  Difficulty walking, Decreased balance, Decreased activity tolerance, Decreased strength, Postural dysfunction, Abnormal gait  Visit Diagnosis: Muscle weakness (generalized)  Repeated falls  Difficulty in walking, not elsewhere classified     Problem List Patient Active Problem List   Diagnosis Date Noted  . Chronic fatigue 06/21/2017  . Hyperlipidemia, mixed 05/12/2017  . Insomnia 05/12/2017  . Multinodular goiter 05/12/2017  . Hoarseness of voice 05/12/2017  . Hyperprolactinemia ([place]) 06/26/2015  . Schizoaffective disorder, bipolar type ([place]) 06/21/2015  . Borderline personality disorder ([place]) 06/20/2015  . Lithium toxicity 05/01/2014  . Leukocytosis 05/01/2014  . Costochondritis 02/14/2014  . Renal stone 02/13/2014  . Fatty liver disease, nonalcoholic 65/78/4696  . Abnormal LFTs  04/06/2013  . Dysuria 02/15/2013  . Medication side effect 01/07/2013  . Lumpy breasts 12/20/2012  . Lump of breast, left 12/20/2012  . Loss of weight 11/08/2012  . Food aversion 11/08/2012  . Constipation 05/17/2012  . Right ear pain 02/16/2012  . Jaw pain 02/13/2012  . Vitamin D deficiency 09/23/2011  . Loose  stools 09/23/2011  . Double vision 03/15/2011  . Hyperglycemia 01/10/2011  . Dyspnea on effort 01/10/2011  . Palpitations 12/08/2010  . ENDOMETRIOSIS 05/11/2008  . DEFICIENCY, VITAMIN D NOS 06/11/2007  . PLANTAR FASCIITIS 06/11/2007  . ALLERGIC RHINITIS 03/26/2007    Anetra Czerwinski, PTA 12/02/2017, 2:50 PM  [place] [address] [place], [place], [address] Phone: [phone]   Fax:  [phone]  Name: [name] MRN: [id number] Date of Birth: 04/18/1961

## 2017-12-03 DIAGNOSIS — F29 Unspecified psychosis not due to a substance or known physiological condition: Secondary | ICD-10-CM | POA: Diagnosis not present

## 2017-12-03 DIAGNOSIS — F3131 Bipolar disorder, current episode depressed, mild: Secondary | ICD-10-CM | POA: Diagnosis not present

## 2017-12-07 ENCOUNTER — Ambulatory Visit: Payer: Medicare Other | Admitting: Physical Therapy

## 2017-12-09 ENCOUNTER — Ambulatory Visit: Payer: Medicare Other | Admitting: Physical Therapy

## 2017-12-14 ENCOUNTER — Encounter: Payer: Self-pay | Admitting: Physical Therapy

## 2017-12-14 ENCOUNTER — Ambulatory Visit: Payer: Medicare Other | Admitting: Physical Therapy

## 2017-12-14 DIAGNOSIS — M6281 Muscle weakness (generalized): Secondary | ICD-10-CM | POA: Diagnosis not present

## 2017-12-14 DIAGNOSIS — R296 Repeated falls: Secondary | ICD-10-CM | POA: Diagnosis not present

## 2017-12-14 DIAGNOSIS — R262 Difficulty in walking, not elsewhere classified: Secondary | ICD-10-CM

## 2017-12-14 NOTE — Patient Instructions (Signed)
Single Leg - Eyes Open  Holding support, lift right leg while maintaining balance over other leg. Progress to removing hands from support surface for longer periods of time. Hold_20-30___ seconds. Repeat __3__ times per session. Do __2__ sessions per day. Single Leg (Compliant Surface) - Eyes Open  Stand on compliant surface: ________ holding support. Lift right leg while maintaining balance over other leg. Progress to removing hands from support surface for longer periods of time. Hold____ seconds. Repeat ____ times per session. Do ____ sessions per day.  Feet Heel-Toe "Tandem", Arm Motion  Eyes Open  With eyes open, right foot directly in front of the other, move arms up and down: to front. Repeat ____ times per session. Do ____ sessions per day. Feet Heel-Toe "Tandem" (Compliant Surface) Arm Motion - Eyes Open  With eyes open, standing on compliant surface: ________, right foot directly in front of the other, move arms up and down: to front. Repeat ____ times per session. Do ____ per day.  Feet Heel-Toe "Tandem"  Instead: take a small step forward and balance 10-20 sec. Do both sides 3x Side-Stepping   Walk to left side with eyes open. Take even steps, leading with same foot. No elastic band yet. Repeat in other direction. Repeat for 1_ minute per session.Do _2_ per day.   Braiding   Move to side: 1) cross right leg in front of left, 2) bring back leg out to side, then 3) cross right leg behind left, 4) bring left leg out to side. Continue sequence in same direction. Reverse sequence, moving in opposite direction. Repeat sequence ____ times per session. Do ____ sessions per day. Repeat on compliant surface: ________.  Copyright  VHI. All rights reserved.

## 2017-12-14 NOTE — Therapy (Addendum)
[facility] [address] [place], [place], [address] Phone: [phone]   Fax:  [phone]  Physical Therapy Treatment  Patient Details  Name: [name] MRN: [id number] Date of Birth: 1961-02-11 Referring Provider: [name], Betty   Encounter Date: 12/14/2017  PT End of Session - 12/14/17 1411    Visit Number  8    Date for PT Re-Evaluation  12/14/17    PT Start Time  1406    PT Stop Time  1445    PT Time Calculation (min)  39 min    Activity Tolerance  Patient tolerated treatment well;Patient limited by lethargy    Behavior During Therapy  [facility] for tasks assessed/performed       Past Medical History:  Diagnosis Date  . Anxiety   . Bipolar 1 disorder (HCC)    HX PSYCHOSIS  . Complication of anesthesia    POST AGGITATION  . Frequency of urination   . Headache(784.0)   . History of duodenal ulcer   . Hyperlipidemia   . IBS (irritable bowel syndrome)   . Nocturia   . Pelvic pain   . Personality disorder ([place])    W/ BORDERLINE FEATURES  . Urgency of urination     Past Surgical History:  Procedure Laterality Date  . CARDIAC CATHETERIZATION  10-09-1999  DR KATZ   NORMAL LVF/  NORMAL RCA/  NO CRITICAL DISEASE LEFT CORONARY SYSTEM  . CARDIOVASCULAR STRESS TEST  01-23-2011   NORMAL NUCLEAR STUDY/ NO ISCHEMIA/ EF 81%  . CYSTOSCOPY WITH BIOPSY N/A 06/03/2013   Procedure: CYSTOSCOPY WITH BIOPSY  INSTILLATION OF MARCAINE AND PYRIDIUM;  Surgeon: Hanley Ben, MD;  Location: [facility];  Service: Urology;  Laterality: N/A;  . LAPAROSCOPIC CHOLECYSTECTOMY  11-08-2001  . LAPAROSCOPIC LEFT SALPINGOOPHORECTOMY AND LYSYS ADHESIONS  03-10-2002  . LAPAROSCOPIC REMOVAL OVARY REMNANT  2003   [place]  . LAPAROSCOPY N/A 12/14/2012   Procedure: LAPAROSCOPY DIAGNOSTIC;  Surgeon: Stark Klein, MD;  Location: [facility];  Service: General;  Laterality: N/A;  . TOTAL ABDOMINAL HYSTERECTOMY  2000   W/  RIGHT SALPINGOOPHORECTOMY  . TRANSTHORACIC ECHOCARDIOGRAM  10-13-2010   NORMAL LVF/  EF 55-60%    There were no vitals filed for this visit.  Subjective Assessment - 12/14/17 1413    Subjective  I have been feeling down lately. My mother has been moved into long term care. My legs feel very weak today.    Limitations  Walking;House hold activities    How long can you stand comfortably?  10 minutes    Patient Stated Goals  feel stronger, be able to walk around the yard    Currently in Pain?  No/denies    Multiple Pain Sites  No                       [facility] Adult PT Treatment/Exercise - 12/14/17 0001      Neuro Re-ed    Neuro Re-ed Details   Narrow BOS,small step forward, reaching out BOS cone to second shelf 5x2 ( reaching no stepping) ,       Knee/Hip Exercises: Aerobic   Nustep  L1 x 74mn PTA present for status update      Knee/Hip Exercises: Standing   Heel Raises  Both;1 set;10 reps    Hip Abduction  AROM;Stengthening;Both;1 set;10 reps;Knee straight    SLS  -- Bil 10 sec 3x       Knee/Hip Exercises: Seated  Long Arc [employer]  Strengthening;Both;1 set;20 reps;Weights    Long Arc Quad Weight  2 lbs.    Marching  Strengthening;Both;1 set;20 reps;Weights    Marching Limitations  2             PT Education - 12/14/17 1438    Education provided  Yes    Education Details  Balance exercises for HEP, how to pick objects up off the floor with support.    Person(s) Educated  Patient    Methods  Explanation;Demonstration;Handout;Verbal cues    Comprehension  Verbalized understanding;Returned demonstration       PT Short Term Goals - 11/25/17 1423      PT SHORT TERM GOAL #3   Title  Pt will perform 5 x sit to stand < 10 seconds for reduced risk of falls    Time  4    Period  Weeks    Status  Achieved 6x        PT Long Term Goals - 11/18/17 1423      PT LONG TERM GOAL #3   Title  Pt reports she is able to stand and do chores around the home for >  or = to 20 minutes at a time due to improved endurance    Time  8    Period  Weeks    Status  On-going 5-10 min      PT LONG TERM GOAL #4   Title  Pt is able to safely walk around her yard to return to recreational activities with reduced risk of falls    Time  8    Period  Weeks    Status  On-going            Plan - 12/14/17 1412    Clinical Impression Statement  Pt was not on schedule today and showed up for an appt. Today is the last day of her current authorization. She would like to see PT on Wednesday for a re-evaluation.  We focused on progressing her HEP for balance despite pt currently being noncompliant with her current HEP.     Rehab Potential  Good    PT Frequency  2x / week    PT Duration  8 weeks    PT Treatment/Interventions  ADLs/Self Care Home Management;Therapeutic exercise;Therapeutic activities;Functional mobility training;Stair training;Gait training;Balance training;Neuromuscular re-education;Patient/family education;Manual techniques;Taping;Passive range of motion    PT Next Visit Plan  ERO visit next, MMT for goals. BERG for goals. Discuss importance of attendance and compliance with HEP ( which pt knows, but has barriers)    Consulted and Agree with Plan of Care  Patient       Patient will benefit from skilled therapeutic intervention in order to improve the following deficits and impairments:  Difficulty walking, Decreased balance, Decreased activity tolerance, Decreased strength, Postural dysfunction, Abnormal gait  Visit Diagnosis: Muscle weakness (generalized)  Repeated falls  Difficulty in walking, not elsewhere classified     Problem List Patient Active Problem List   Diagnosis Date Noted  . Chronic fatigue 06/21/2017  . Hyperlipidemia, mixed 05/12/2017  . Insomnia 05/12/2017  . Multinodular goiter 05/12/2017  . Hoarseness of voice 05/12/2017  . Hyperprolactinemia ([place]) 06/26/2015  . Schizoaffective disorder, bipolar type ([place])  06/21/2015  . Borderline personality disorder ([place]) 06/20/2015  . Lithium toxicity 05/01/2014  . Leukocytosis 05/01/2014  . Costochondritis 02/14/2014  . Renal stone 02/13/2014  . Fatty liver disease, nonalcoholic 69/62/9528  . Abnormal LFTs 04/06/2013  . Dysuria 02/15/2013  .  Medication side effect 01/07/2013  . Lumpy breasts 12/20/2012  . Lump of breast, left 12/20/2012  . Loss of weight 11/08/2012  . Food aversion 11/08/2012  . Constipation 05/17/2012  . Right ear pain 02/16/2012  . Jaw pain 02/13/2012  . Vitamin D deficiency 09/23/2011  . Loose stools 09/23/2011  . Double vision 03/15/2011  . Hyperglycemia 01/10/2011  . Dyspnea on effort 01/10/2011  . Palpitations 12/08/2010  . ENDOMETRIOSIS 05/11/2008  . DEFICIENCY, VITAMIN D NOS 06/11/2007  . PLANTAR FASCIITIS 06/11/2007  . ALLERGIC RHINITIS 03/26/2007    Tiler Brandis, PTA 12/14/2017, 4:26 PM  [facility] [address] [place], [place], [address] Phone: [phone]   Fax:  [phone]  Name: [name] MRN: [id number] Date of Birth: [birth year]  PHYSICAL THERAPY DISCHARGE SUMMARY  Visits from Start of Care: 8  Current functional level related to goals / functional outcomes: See above   Remaining deficits: See above   Education / Equipment: HEP  Plan: Patient agrees to discharge.  Patient goals were partially met. Patient is being discharged due to not returning since the last visit.  ?????    [employer], PT 05/07/18 10:24 AM

## 2017-12-16 ENCOUNTER — Ambulatory Visit: Payer: Medicare Other | Admitting: Physical Therapy

## 2017-12-22 DIAGNOSIS — F3131 Bipolar disorder, current episode depressed, mild: Secondary | ICD-10-CM | POA: Diagnosis not present

## 2017-12-22 DIAGNOSIS — F29 Unspecified psychosis not due to a substance or known physiological condition: Secondary | ICD-10-CM | POA: Diagnosis not present

## 2018-01-14 DIAGNOSIS — F3131 Bipolar disorder, current episode depressed, mild: Secondary | ICD-10-CM | POA: Diagnosis not present

## 2018-02-01 DIAGNOSIS — F29 Unspecified psychosis not due to a substance or known physiological condition: Secondary | ICD-10-CM | POA: Diagnosis not present

## 2018-02-10 DIAGNOSIS — F3131 Bipolar disorder, current episode depressed, mild: Secondary | ICD-10-CM | POA: Diagnosis not present

## 2018-02-10 IMAGING — MG 2D DIGITAL SCREENING BILATERAL MAMMOGRAM WITH 3D TOMO WITH CAD
8 of 12 series · 8 of 28 positions shown · non-contrast
Comparison: Previous exam(s).

CLINICAL DATA: Screening.

EXAM:
2D DIGITAL SCREENING BILATERAL MAMMOGRAM WITH 3D TOMO WITH CAD

[L CC synth-2D]
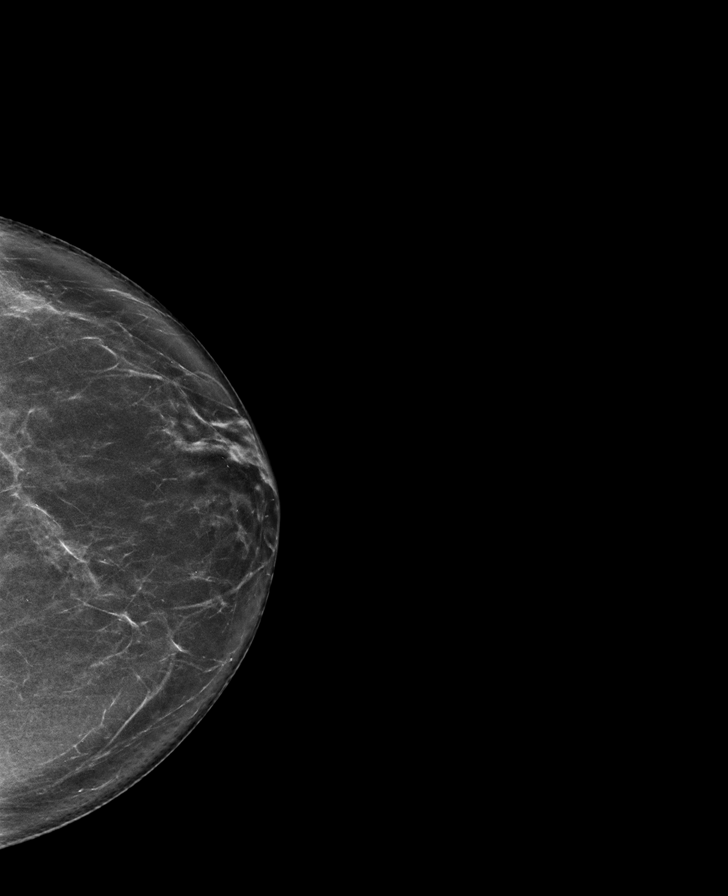

[R MLO synth-2D]
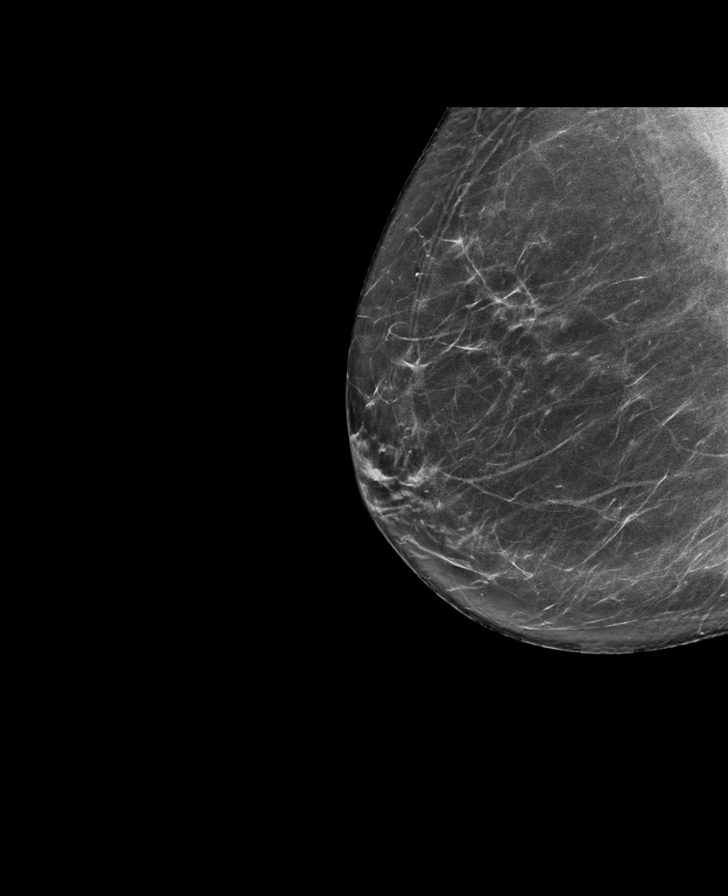

[L MLO]
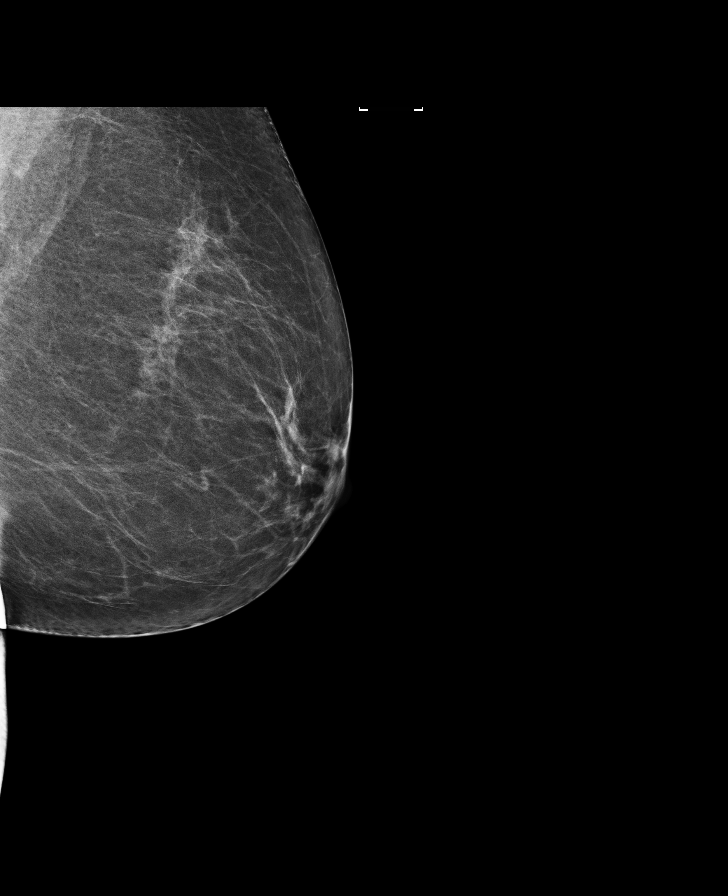

[L CC]
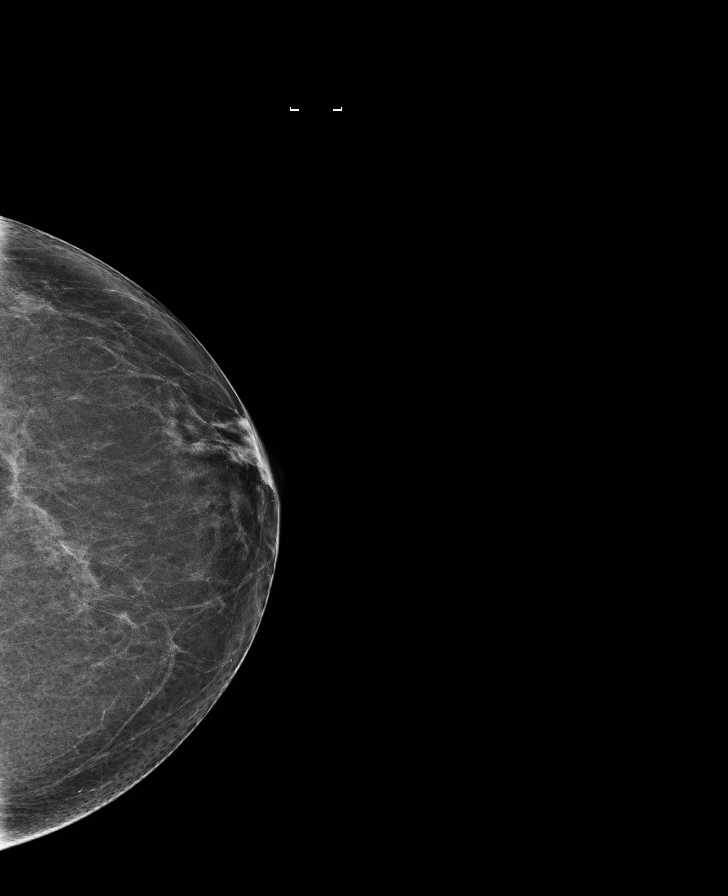

[R CC]
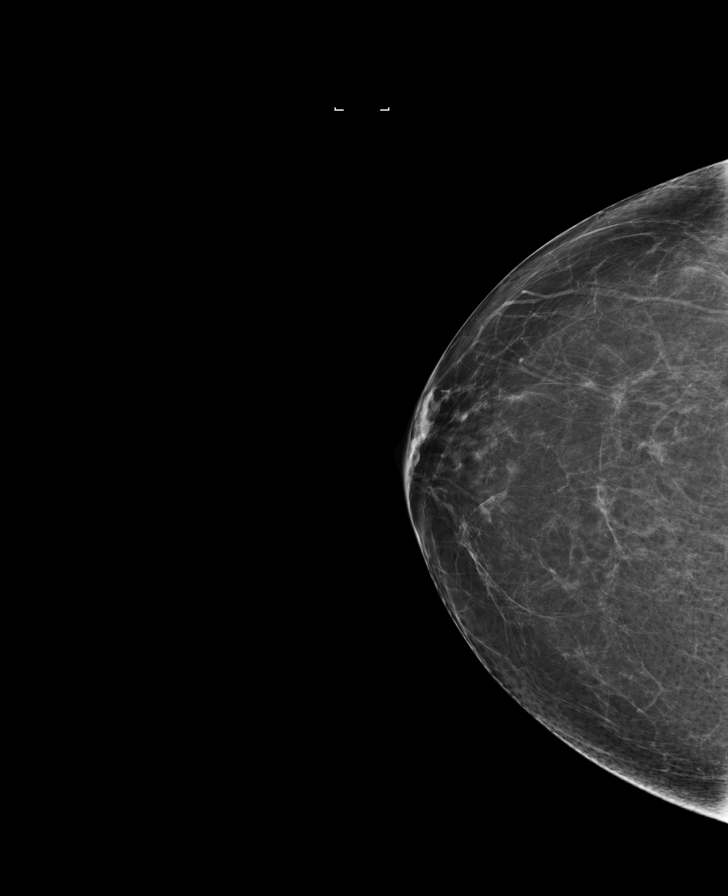

[R MLO]
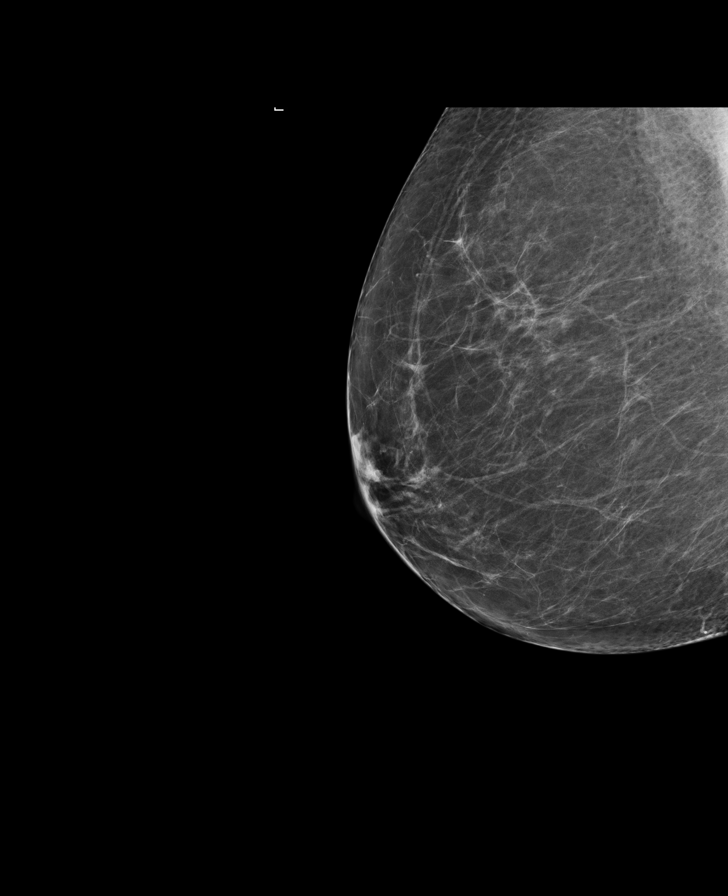

[R CC synth-2D]
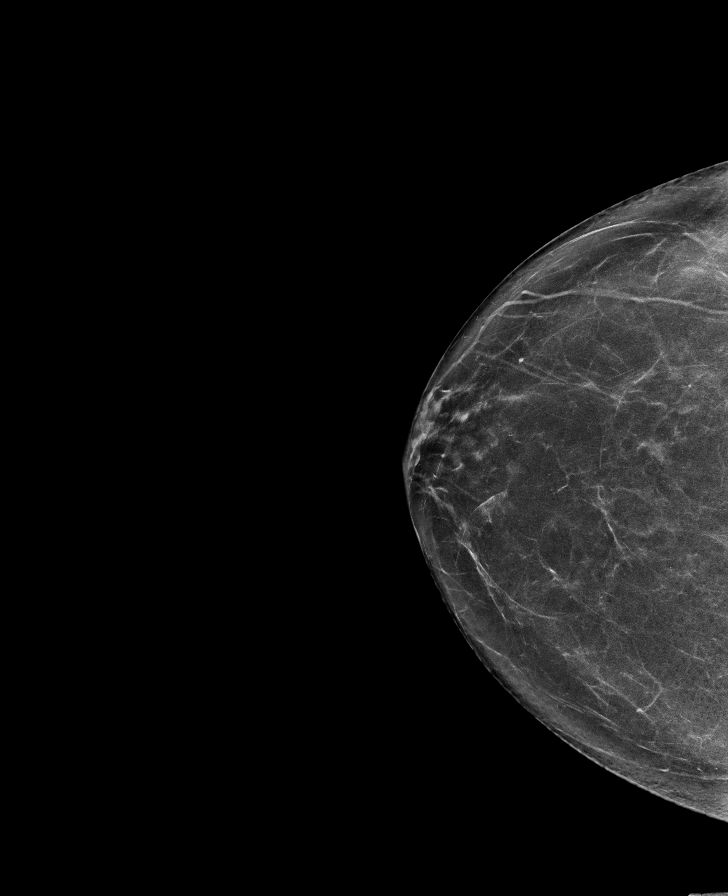

[L MLO synth-2D]
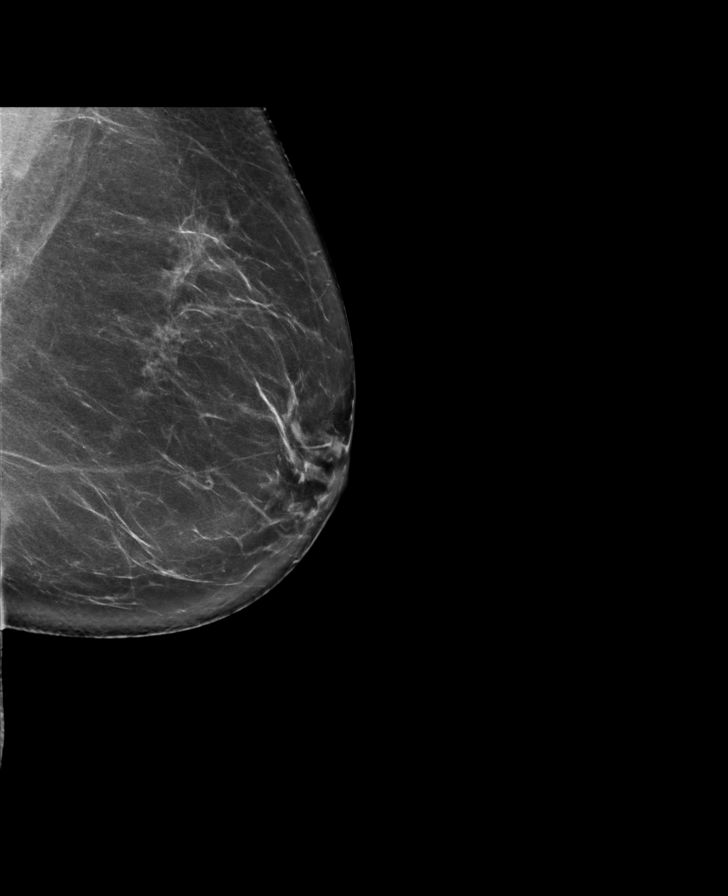

[8 of 28 positions shown; findings below may reference images not displayed]

ACR Breast Density Category b: There are scattered areas of
fibroglandular density.
FINDINGS: There are no findings suspicious for malignancy. Images were
processed with CAD.
IMPRESSION: No mammographic evidence of malignancy. A result letter of this
screening mammogram will be mailed directly to the patient.

RECOMMENDATION:
Screening mammogram in one year. (Code:[id number]-P-ZS0)

BI-RADS CATEGORY  1: Negative.

## 2018-02-11 DIAGNOSIS — R300 Dysuria: Secondary | ICD-10-CM | POA: Diagnosis not present

## 2018-02-11 DIAGNOSIS — R350 Frequency of micturition: Secondary | ICD-10-CM | POA: Diagnosis not present

## 2018-03-01 DIAGNOSIS — N3010 Interstitial cystitis (chronic) without hematuria: Secondary | ICD-10-CM | POA: Diagnosis not present

## 2018-03-02 ENCOUNTER — Ambulatory Visit: Payer: Medicare Other | Admitting: Family Medicine

## 2018-03-02 DIAGNOSIS — Z0289 Encounter for other administrative examinations: Secondary | ICD-10-CM

## 2018-03-15 DIAGNOSIS — F29 Unspecified psychosis not due to a substance or known physiological condition: Secondary | ICD-10-CM | POA: Diagnosis not present

## 2018-03-30 DIAGNOSIS — F3131 Bipolar disorder, current episode depressed, mild: Secondary | ICD-10-CM | POA: Diagnosis not present

## 2018-03-30 DIAGNOSIS — F29 Unspecified psychosis not due to a substance or known physiological condition: Secondary | ICD-10-CM | POA: Diagnosis not present

## 2018-04-15 DIAGNOSIS — F29 Unspecified psychosis not due to a substance or known physiological condition: Secondary | ICD-10-CM | POA: Diagnosis not present

## 2018-04-15 DIAGNOSIS — F3131 Bipolar disorder, current episode depressed, mild: Secondary | ICD-10-CM | POA: Diagnosis not present

## 2018-05-25 DIAGNOSIS — F3131 Bipolar disorder, current episode depressed, mild: Secondary | ICD-10-CM | POA: Diagnosis not present

## 2018-06-09 DIAGNOSIS — J209 Acute bronchitis, unspecified: Secondary | ICD-10-CM | POA: Diagnosis not present

## 2018-06-09 DIAGNOSIS — R05 Cough: Secondary | ICD-10-CM | POA: Diagnosis not present

## 2018-06-14 ENCOUNTER — Ambulatory Visit: Payer: Self-pay | Admitting: *Deleted

## 2018-06-14 NOTE — Telephone Encounter (Signed)
  Patient was diagnosed with bronchitis one week ago today at [facility]. Prescribed prednisone and zpak she completed today. She was given a nebulized tx at [facility] and she has albulterol inhaler she has used Q4-6 hours for 2 days now. "feels weak from all this". She describes her chest as tight with deep breathing. Denies fever/SOB/fever/N/V.Stated she has occasional dizziness which she has had months prior to this illness. Once, last week she had a quick pain on the back near her shoulder-none since. Has had a cough with occasional thick yellow phlegm. Advice care provided including mucinex OTC and increased fluid intake. Offered appointment today she requested tomorrow. Reviewed symptoms requiring immediate emergency intervention-stated she understood. Appointment made with PCP for tomorrow.  Reason for Disposition . [1] Chest pain lasts > 5 minutes AND [2] occurred > 3 days ago (72 hours) AND [3] NO chest pain or cardiac symptoms now  Answer Assessment - Initial Assessment Questions 1. LOCATION: "Where does it hurt?"       Middle, just below the breast. Feels tight. 2. RADIATION: "Does the pain go anywhere else?" (e.g., into neck, jaw, arms, back)     no 3. ONSET: "When did the chest pain begin?" (Minutes, hours or days)      Went  To [place] urgent [facility] one week ago today 4. PATTERN "Does the pain come and go, or has it been constant since it started?"  "Does it get worse with exertion?"      Constant with breathing 5. DURATION: "How long does it last" (e.g., seconds, minutes, hours)    constant 6. SEVERITY: "How bad is the pain?"  (e.g., Scale 1-10; mild, moderate, or severe)    - MILD (1-3): doesn't interfere with normal activities     - MODERATE (4-7): interferes with normal activities or awakens from sleep    - SEVERE (8-10): excruciating pain, unable to do any normal activities       6 moderate 7. CARDIAC RISK FACTORS: "Do you have any history of heart problems or  risk factors for heart disease?" (e.g., prior heart attack, angina; high blood pressure, diabetes, being overweight, high cholesterol, smoking, or strong family history of heart disease)     no 8. PULMONARY RISK FACTORS: "Do you have any history of lung disease?"  (e.g., blood clots in lung, asthma, emphysema, birth control pills)     no 9. CAUSE: "What do you think is causing the chest pain?"     Not certain of clinic's diagnosis of bronchitis 10. OTHER SYMPTOMS: "Do you have any other symptoms?" (e.g., dizziness, nausea, vomiting, sweating, fever, difficulty breathing, cough)       Cough for 6 months Dr. [name] aware. 11. PREGNANCY: "Is there any chance you are pregnant?" "When was your last menstrual period?"       no  Protocols used: CHEST PAIN-A-AH

## 2018-06-15 ENCOUNTER — Ambulatory Visit (INDEPENDENT_AMBULATORY_CARE_PROVIDER_SITE_OTHER): Payer: Medicare Other

## 2018-06-15 ENCOUNTER — Encounter: Payer: Self-pay | Admitting: Family Medicine

## 2018-06-15 ENCOUNTER — Ambulatory Visit (INDEPENDENT_AMBULATORY_CARE_PROVIDER_SITE_OTHER): Payer: Medicare Other | Admitting: Family Medicine

## 2018-06-15 VITALS — BP 120/68 | HR 100 | Temp 98.0°F | Resp 16 | Wt 152.8 lb

## 2018-06-15 DIAGNOSIS — R29898 Other symptoms and signs involving the musculoskeletal system: Secondary | ICD-10-CM | POA: Diagnosis not present

## 2018-06-15 DIAGNOSIS — R079 Chest pain, unspecified: Secondary | ICD-10-CM | POA: Diagnosis not present

## 2018-06-15 DIAGNOSIS — R059 Cough, unspecified: Secondary | ICD-10-CM

## 2018-06-15 DIAGNOSIS — R05 Cough: Secondary | ICD-10-CM

## 2018-06-15 DIAGNOSIS — J989 Respiratory disorder, unspecified: Secondary | ICD-10-CM

## 2018-06-15 DIAGNOSIS — R0989 Other specified symptoms and signs involving the circulatory and respiratory systems: Secondary | ICD-10-CM

## 2018-06-15 DIAGNOSIS — R0602 Shortness of breath: Secondary | ICD-10-CM | POA: Diagnosis not present

## 2018-06-15 MED ORDER — PROAIR HFA 108 (90 BASE) MCG/ACT IN AERS: 2.0000 | INHALATION_SPRAY | RESPIRATORY_TRACT | 2 refills | Status: DC | PRN

## 2018-06-15 NOTE — Patient Instructions (Addendum)
A few things to remember from today's visit:   Complaints of weakness of lower extremity - Plan: Ambulatory referral to Neurology  Reactive airway disease that is not asthma - Plan: PROAIR HFA 108 (90 Base) MCG/ACT inhaler Continue albuterol inhaler daily every 4-6 hours as needed. Cough can last a few days or even weeks but you should not have fever.  Fall precautions. Appointment with neurologist will be arranged as you requested.  Please be sure medication list is accurate. If a new problem present, please set up appointment sooner than planned today.

## 2018-06-15 NOTE — Progress Notes (Signed)
She was seen on 06/09/2018, diagnosed with acute bronchitis     HPI:   Ms.Layza E Rambeau is a 57 y.o. female, who is here today to follow on recent OV/ER.   She was seen on 06/09/2018 at urgent care, she was diagnosed with acute bronchitis. She was started on prednisone and oral antibiotic, azithromycin. Still coughing and having intermittent wheezing. Denies hemoptysis.  Negative for fever or chills.  + Fatigue,  Former smoker.  C/O LE weakness, chronic. Constant. She has not identified exacerbating or alleviating factors.  States that she has not been able to exercise,feels like problem is getting worse. No edema,erythema,or tingling.numbness.   According to patient, in the past there was a concern about MS.  She was following with neurologist, Dr. Dellis Filbert, who moved out of town. She has also seen Dr. Leonie Man. States that she had EMG done in lower extremities.   She was referred to physical therapy but she is not completed, she feels like PT help. Hx of frequent falls but she has not had any falls since her last visit.   Review of Systems  Constitutional: Positive for fatigue. Negative for activity change, appetite change and fever.  HENT: Positive for congestion and postnasal drip. Negative for ear pain, mouth sores, sinus pressure, sneezing, sore throat, trouble swallowing and voice change.   Eyes: Negative for discharge, redness and itching.  Respiratory: Positive for cough and wheezing. Negative for shortness of breath.   Gastrointestinal: Negative for abdominal pain, nausea and vomiting.       No changes in bowel habits.  Musculoskeletal: Positive for gait problem. Negative for back pain and myalgias.  Skin: Negative for rash.  Allergic/Immunologic: Positive for environmental allergies.  Neurological: Positive for weakness. Negative for numbness and headaches.  Psychiatric/Behavioral: Negative for confusion. The patient is nervous/anxious.       Current  Outpatient Medications on File Prior to Visit  Medication Sig Dispense Refill  . ARIPiprazole (ABILIFY) 5 MG tablet Take 5 mg by mouth daily.    [name] Kitchen buPROPion (WELLBUTRIN XL) 150 MG 24 hr tablet TAKE 3 TABLETS IN THE MORNING  0  . gabapentin (NEURONTIN) 300 MG capsule     . lithium carbonate (LITHOBID) 300 MG CR tablet Take 2 tablets (600 mg total) by mouth every evening. (Patient taking differently: Take 1 tablet by mouth in the morning and 2 tablets in the evening.) 60 tablet 0  . LORazepam (ATIVAN) 0.5 MG tablet Take 1 tablet by mouth in the morning and 2 tablets by mouth in the evening.    . ondansetron (ZOFRAN) 4 MG tablet Take 1 tablet (4 mg total) by mouth every 8 (eight) hours as needed for nausea or vomiting. 20 tablet 0  . zolpidem (AMBIEN) 10 MG tablet Take 10 mg by mouth at bedtime.  2   No current facility-administered medications on file prior to visit.      Past Medical History:  Diagnosis Date  . Anxiety   . Bipolar 1 disorder (HCC)    HX PSYCHOSIS  . Complication of anesthesia    POST AGGITATION  . Frequency of urination   . Headache(784.0)   . History of duodenal ulcer   . Hyperlipidemia   . IBS (irritable bowel syndrome)   . Nocturia   . Pelvic pain   . Personality disorder ([place])    W/ BORDERLINE FEATURES  . Urgency of urination    Allergies  Allergen Reactions  . Morphine And Related Nausea And Vomiting  . Duloxetine  Other reaction(s): SWELLING/EDEMA  . Latuda [Lurasidone Hcl]   . Morphine Nausea Only    Social History   Socioeconomic History  . Marital status: Single    Spouse name: Not on file  . Number of children: 0  . Years of education: college  . Highest education level: Not on file  Occupational History  . Occupation: N/A    Comment: Nurse  Social Needs  . Financial resource strain: Not on file  . Food insecurity:    Worry: Not on file    Inability: Not on file  . Transportation needs:    Medical: Not on file    Non-medical: Not  on file  Tobacco Use  . Smoking status: Former Smoker    Packs/day: 0.01    Years: 1.00    Pack years: 0.01    Types: Cigarettes    Last attempt to quit: 08/18/2009    Years since quitting: 8.8  . Smokeless tobacco: Never Used  . Tobacco comment: ONLY SMOKED FOR 6 MONTHS --  QUIT  YRS AGO  Substance and Sexual Activity  . Alcohol use: Yes    Comment: occ  . Drug use: No  . Sexual activity: Not on file  Lifestyle  . Physical activity:    Days per week: Not on file    Minutes per session: Not on file  . Stress: Not on file  Relationships  . Social connections:    Talks on phone: Not on file    Gets together: Not on file    Attends religious service: Not on file    Active member of club or organization: Not on file    Attends meetings of clubs or organizations: Not on file    Relationship status: Not on file  Other Topics Concern  . 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    no tobacco   Caffeine Use: very little, three times a week    Vitals:   06/15/18 1433  BP: 120/68  Pulse: 100  Resp: 16  Temp: 98 F (36.7 C)  SpO2: 97%   Body mass index is 29.84 kg/m.   Physical Exam  Nursing note and vitals reviewed. Constitutional: She is oriented to person, place, and time. She appears well-developed. She does not appear ill. No distress.  HENT:  Head: Normocephalic and atraumatic.  Right Ear: Tympanic membrane, external ear and ear canal normal.  Left Ear: Tympanic membrane, external ear and ear canal normal.  Nose: Rhinorrhea present. Right sinus exhibits no maxillary sinus tenderness and no frontal sinus tenderness. Left sinus exhibits no maxillary sinus tenderness and no frontal sinus tenderness.  Mouth/Throat: Oropharynx is clear and moist and mucous membranes are normal.  Eyes: Conjunctivae are normal.   Neck: No muscular tenderness present. No edema and no erythema present.  Cardiovascular: Normal rate and regular rhythm.  No murmur heard. Respiratory: Effort normal and breath sounds normal. No stridor. No respiratory distress.  GI: Soft. She exhibits no mass. There is no tenderness.  Lymphadenopathy:       Head (right side): No submandibular adenopathy present.       Head (left side): No submandibular adenopathy present.    She has no cervical adenopathy.  Neurological: She is alert and oriented to person, place, and time. She has normal strength.  Reflex  Scores:      Patellar reflexes are 2+ on the right side and 2+ on the left side. Otherwise stable gait with no assistance.  Skin: Skin is warm. No rash noted. No erythema.  Psychiatric:  Flat affect, well groomed, good eye contact.    ASSESSMENT AND PLAN:  Ms. Alvetta was seen today for bronchitis.  Diagnoses and all orders for this visit:  Reactive airway disease that is not asthma  Lung auscultation negative today. Recommend  Albuterol inh 2 puff every 6 hours for a week then as needed for wheezing or shortness of breath.  I do not think oral Prednisone is needed at this time. Instructed about warning signs.  -     PROAIR HFA 108 (90 Base) MCG/ACT inhaler; Inhale 2 puffs into the lungs every 4 (four) hours as needed for wheezing or shortness of breath. -     DG Chest 2 View; Future  Complaints of weakness of lower extremity  No focal deficit appreciated. She did not complete PT but reports improvement. Neuro referral placed as requested.  -     Ambulatory referral to Neurology  Cough -     DG Chest 2 View; Future       Sanjuana Mruk G. [name], MD  [facility]. [place] office.

## 2018-06-16 ENCOUNTER — Encounter: Payer: Self-pay | Admitting: Neurology

## 2018-06-16 ENCOUNTER — Encounter: Payer: Self-pay | Admitting: Family Medicine

## 2018-06-18 ENCOUNTER — Emergency Department (HOSPITAL_BASED_OUTPATIENT_CLINIC_OR_DEPARTMENT_OTHER): Payer: Medicare Other

## 2018-06-18 ENCOUNTER — Encounter (HOSPITAL_BASED_OUTPATIENT_CLINIC_OR_DEPARTMENT_OTHER): Payer: Self-pay | Admitting: Emergency Medicine

## 2018-06-18 ENCOUNTER — Emergency Department (HOSPITAL_BASED_OUTPATIENT_CLINIC_OR_DEPARTMENT_OTHER)
Admission: EM | Admit: 2018-06-18 | Discharge: 2018-06-18 | Disposition: A | Discharge status: Home / Self Care | Payer: Medicare Other | Attending: Emergency Medicine | Admitting: Emergency Medicine

## 2018-06-18 ENCOUNTER — Other Ambulatory Visit: Payer: Self-pay

## 2018-06-18 DIAGNOSIS — Z87891 Personal history of nicotine dependence: Secondary | ICD-10-CM | POA: Diagnosis not present

## 2018-06-18 DIAGNOSIS — Z79899 Other long term (current) drug therapy: Secondary | ICD-10-CM | POA: Insufficient documentation

## 2018-06-18 DIAGNOSIS — J189 Pneumonia, unspecified organism: Secondary | ICD-10-CM

## 2018-06-18 DIAGNOSIS — R509 Fever, unspecified: Secondary | ICD-10-CM | POA: Diagnosis present

## 2018-06-18 DIAGNOSIS — R05 Cough: Secondary | ICD-10-CM | POA: Diagnosis not present

## 2018-06-18 DIAGNOSIS — J181 Lobar pneumonia, unspecified organism: Secondary | ICD-10-CM

## 2018-06-18 LAB — CBC WITH DIFFERENTIAL/PLATELET
Abs Immature Granulocytes: 0.04 10*3/uL (ref 0.00–0.07)
Basophils Absolute: 0.1 10*3/uL (ref 0.0–0.1)
Basophils Relative: 1 %
EOS PCT: 3 %
Eosinophils Absolute: 0.3 10*3/uL (ref 0.0–0.5)
HEMATOCRIT: 40.4 % (ref 36.0–46.0)
HEMOGLOBIN: 12.8 g/dL (ref 12.0–15.0)
Immature Granulocytes: 0 %
LYMPHS ABS: 1.9 10*3/uL (ref 0.7–4.0)
Lymphocytes Relative: 17 %
MCH: 30.7 pg (ref 26.0–34.0)
MCHC: 31.7 g/dL (ref 30.0–36.0)
MCV: 96.9 fL (ref 80.0–100.0)
MONO ABS: 1.2 10*3/uL — AB (ref 0.1–1.0)
MONOS PCT: 11 %
Neutro Abs: 7.4 10*3/uL (ref 1.7–7.7)
Neutrophils Relative %: 68 %
Platelets: 294 10*3/uL (ref 150–400)
RBC: 4.17 MIL/uL (ref 3.87–5.11)
RDW: 12.3 % (ref 11.5–15.5)
WBC: 11 10*3/uL — AB (ref 4.0–10.5)
nRBC: 0 % (ref 0.0–0.2)

## 2018-06-18 LAB — URINALYSIS, MICROSCOPIC (REFLEX): RBC / HPF: NONE SEEN RBC/hpf (ref 0–5)

## 2018-06-18 LAB — COMPREHENSIVE METABOLIC PANEL
ALK PHOS: 74 U/L (ref 38–126)
ALT: 74 U/L — AB (ref 0–44)
ANION GAP: 9 (ref 5–15)
AST: 37 U/L (ref 15–41)
Albumin: 4.1 g/dL (ref 3.5–5.0)
BILIRUBIN TOTAL: 1.4 mg/dL — AB (ref 0.3–1.2)
BUN: 9 mg/dL (ref 6–20)
CALCIUM: 10.1 mg/dL (ref 8.9–10.3)
CO2: 22 mmol/L (ref 22–32)
CREATININE: 0.88 mg/dL (ref 0.44–1.00)
Chloride: 106 mmol/L (ref 98–111)
GFR calc non Af Amer: >60 mL/min (ref >60)
Glucose, Bld: 115 mg/dL — ABNORMAL HIGH (ref 70–99)
Potassium: 3.9 mmol/L (ref 3.5–5.1)
Sodium: 137 mmol/L (ref 135–145)
TOTAL PROTEIN: 7.5 g/dL (ref 6.5–8.1)

## 2018-06-18 LAB — URINALYSIS, ROUTINE W REFLEX MICROSCOPIC
BILIRUBIN URINE: NEGATIVE
Glucose, UA: NEGATIVE mg/dL
Hgb urine dipstick: NEGATIVE
KETONES UR: NEGATIVE mg/dL
Nitrite: NEGATIVE
PH: 6 (ref 5.0–8.0)
PROTEIN: NEGATIVE mg/dL
Specific Gravity, Urine: 1.025 (ref 1.005–1.030)

## 2018-06-18 MED ORDER — LEVOFLOXACIN 750 MG PO TABS: 750.0000 mg | ORAL_TABLET | Freq: Every day | ORAL | 0 refills | Status: DC

## 2018-06-18 MED ORDER — IBUPROFEN 400 MG PO TABS
600.0000 mg | ORAL_TABLET | Freq: Once | ORAL | Status: AC
  Administered 2018-06-18: 600 mg via ORAL
  Filled 2018-06-18: qty 1

## 2018-06-18 MED ORDER — LEVOFLOXACIN 750 MG PO TABS
750.0000 mg | ORAL_TABLET | Freq: Once | ORAL | Status: AC
  Administered 2018-06-18: 750 mg via ORAL
  Filled 2018-06-18: qty 1

## 2018-06-18 NOTE — ED Triage Notes (Signed)
Patient reports generalized body aches and fever x 2 days.  Seen at [facility] and diagnosed with bronchitis 1 week ago.

## 2018-06-18 NOTE — ED Provider Notes (Signed)
[facility] EMERGENCY DEPARTMENT Provider Note   [name]: [id number] Arrival date & time: 06/18/18  1908     History   Chief Complaint Chief Complaint  Patient presents with  . Generalized Body Aches    HPI [name] is a 57 y.o. female.  HPI Patient presents was recently diagnosed with bronchitis.  Finished course of azithromycin and prednisone.  Over the last 2 days she is again experiencing fever and generalized body aches.  She has ongoing cough which is nonproductive.  Endorses urinary frequency but no dysuria.  No nausea, vomiting or diarrhea. Past Medical History:  Diagnosis Date  . Anxiety   . Bipolar 1 disorder (HCC)    HX PSYCHOSIS  . Complication of anesthesia    POST AGGITATION  . Frequency of urination   . Headache(784.0)   . History of duodenal ulcer   . Hyperlipidemia   . IBS (irritable bowel syndrome)   . Nocturia   . Pelvic pain   . Personality disorder ([place])    W/ BORDERLINE FEATURES  . Urgency of urination     Patient Active Problem List   Diagnosis Date Noted  . Chronic fatigue 06/21/2017  . Hyperlipidemia, mixed 05/12/2017  . Insomnia 05/12/2017  . Multinodular goiter 05/12/2017  . Hoarseness of voice 05/12/2017  . Hyperprolactinemia ([place]) 06/26/2015  . Schizoaffective disorder, bipolar type ([place]) 06/21/2015  . Borderline personality disorder ([place]) 06/20/2015  . Lithium toxicity 05/01/2014  . Leukocytosis 05/01/2014  . Costochondritis 02/14/2014  . Renal stone 02/13/2014  . Fatty liver disease, nonalcoholic 94/58/5929  . Abnormal LFTs 04/06/2013  . Dysuria 02/15/2013  . Medication side effect 01/07/2013  . Lumpy breasts 12/20/2012  . Lump of breast, left 12/20/2012  . Loss of weight 11/08/2012  . Food aversion 11/08/2012  . Constipation 05/17/2012  . Right ear pain 02/16/2012  . Jaw pain 02/13/2012  . Vitamin D deficiency 09/23/2011  . Loose stools 09/23/2011  . Double vision 03/15/2011  . Hyperglycemia 01/10/2011    . Dyspnea on effort 01/10/2011  . Palpitations 12/08/2010  . ENDOMETRIOSIS 05/11/2008  . DEFICIENCY, VITAMIN D NOS 06/11/2007  . PLANTAR FASCIITIS 06/11/2007  . ALLERGIC RHINITIS 03/26/2007    Past Surgical History:  Procedure Laterality Date  . CARDIAC CATHETERIZATION  10-09-1999  DR KATZ   NORMAL LVF/  NORMAL RCA/  NO CRITICAL DISEASE LEFT CORONARY SYSTEM  . CARDIOVASCULAR STRESS TEST  01-23-2011   NORMAL NUCLEAR STUDY/ NO ISCHEMIA/ EF 81%  . CYSTOSCOPY WITH BIOPSY N/A 06/03/2013   Procedure: CYSTOSCOPY WITH BIOPSY  INSTILLATION OF MARCAINE AND PYRIDIUM;  Surgeon: Hanley Ben, MD;  Location: [facility];  Service: Urology;  Laterality: N/A;  . LAPAROSCOPIC CHOLECYSTECTOMY  11-08-2001  . LAPAROSCOPIC LEFT SALPINGOOPHORECTOMY AND LYSYS ADHESIONS  03-10-2002  . LAPAROSCOPIC REMOVAL OVARY REMNANT  2003   [place]  . LAPAROSCOPY N/A 12/14/2012   Procedure: LAPAROSCOPY DIAGNOSTIC;  Surgeon: Stark Klein, MD;  Location: [facility];  Service: General;  Laterality: N/A;  . TOTAL ABDOMINAL HYSTERECTOMY  2000   W/ RIGHT SALPINGOOPHORECTOMY  . TRANSTHORACIC ECHOCARDIOGRAM  10-13-2010   NORMAL LVF/  EF 55-60%     OB History   None      Home Medications    Prior to Admission medications   Medication Sig Start Date End Date Taking? Authorizing Provider  ARIPiprazole (ABILIFY) 5 MG tablet Take 5 mg by mouth daily.    [provider]  azithromycin (ZITHROMAX) 250 MG tablet TAKE 2 TABLETS BY  MOUTH ON DAY 1 AND THEN TAKE 1 TABLET BY MOUTH ONCE A DAY ON DAY 2 THROUGH DAY 5 06/09/18   [provider]  buPROPion (WELLBUTRIN XL) 150 MG 24 hr tablet TAKE 3 TABLETS IN THE MORNING 08/26/17   [provider]  gabapentin (NEURONTIN) 300 MG capsule  11/16/17   [provider]  levofloxacin (LEVAQUIN) 750 MG tablet Take 1 tablet (750 mg total) by mouth daily. X 7 days 06/19/18   Julianne Rice, MD  lithium carbonate (LITHOBID) 300 MG CR tablet Take 2  tablets (600 mg total) by mouth every evening. Patient taking differently: Take 1 tablet by mouth in the morning and 2 tablets in the evening. 06/27/15   [place], Elyse Jarvis, FNP  LORazepam (ATIVAN) 0.5 MG tablet Take 1 tablet by mouth in the morning and 2 tablets by mouth in the evening.    [provider]  ondansetron (ZOFRAN) 4 MG tablet Take 1 tablet (4 mg total) by mouth every 8 (eight) hours as needed for nausea or vomiting. 11/18/17   [name], [name], MD  predniSONE (DELTASONE) 5 MG tablet TAKE 6 TABLETS ON DAY 1 THEN TAKE 5 TABLETS ON DAY 2 THEN TAKE 4 TABLETS ON DAY 3 THEN TAKE 3 TABLETS ON DAY 4 THEN TAKE 2 TABLETS ON DAY 5 06/09/18   [provider]  PROAIR HFA 108 (90 Base) MCG/ACT inhaler Inhale 2 puffs into the lungs every 4 (four) hours as needed for wheezing or shortness of breath. 06/15/18   [name], [name], MD  zolpidem (AMBIEN) 10 MG tablet Take 10 mg by mouth at bedtime. 11/09/17   [provider]    Family History Family History  Problem Relation Age of Onset  . Sleep apnea Father   . Depression Father   . Alcohol abuse Father   . Hypertension Father   . Migraines Sister        headaches  . Anxiety disorder Sister   . Other Mother        MAC infection  . Anxiety disorder Mother   . Dementia Mother   . Heart disease Paternal Grandmother   . Hyperlipidemia Paternal Grandmother   . Alcohol abuse Paternal Grandfather   . Diabetes Neg Hx     Social History Social History   Tobacco Use  . Smoking status: Former Smoker    Packs/day: 0.01    Years: 1.00    Pack years: 0.01    Types: Cigarettes    Last attempt to quit: 08/18/2009    Years since quitting: 8.8  . Smokeless tobacco: Never Used  . Tobacco comment: ONLY SMOKED FOR 6 MONTHS --  QUIT  YRS AGO  Substance Use Topics  . Alcohol use: Yes    Comment: occ  . Drug use: No     Allergies   Morphine and related; Duloxetine; Latuda [lurasidone hcl]; and Morphine   Review of  Systems Review of Systems  Constitutional: Positive for chills, fatigue and fever.  HENT: Negative for congestion and sore throat.   Respiratory: Positive for cough. Negative for shortness of breath.   Cardiovascular: Negative for chest pain.  Gastrointestinal: Negative for abdominal pain, diarrhea, nausea and vomiting.  Genitourinary: Positive for frequency. Negative for difficulty urinating, dysuria and flank pain.  Musculoskeletal: Positive for back pain and myalgias. Negative for neck pain and neck stiffness.  Skin: Negative for rash and wound.  Neurological: Negative for dizziness, weakness, light-headedness, numbness and headaches.  All other systems reviewed and  are negative.    Physical Exam Updated Vital Signs BP (!) 148/81   Pulse (!) 102   Temp 98.2 F (36.8 C) (Oral)   Resp 18   Ht 5' (1.524 m)   Wt 71.2 kg   SpO2 95%   BMI 30.66 kg/m   Physical Exam  Constitutional: She is oriented to person, place, and time. She appears well-developed and well-nourished. No distress.  HENT:  Head: Normocephalic and atraumatic.  Mouth/Throat: Oropharynx is clear and moist. No oropharyngeal exudate.  Eyes: Pupils are equal, round, and reactive to light. EOM are normal.  Neck: Normal range of motion. Neck supple. No JVD present.  No meningismus  Cardiovascular: Normal rate and regular rhythm. Exam reveals no gallop and no friction rub.  No murmur heard. Pulmonary/Chest: Effort normal and breath sounds normal. No stridor. No respiratory distress. She has no wheezes. She has no rales. She exhibits no tenderness.  Abdominal: Soft. Bowel sounds are normal. There is no tenderness. There is no rebound and no guarding.  Musculoskeletal: Normal range of motion. She exhibits no edema or tenderness.  Very minimal inferior lumbar midline tenderness to palpation.  No CVA tenderness.  No lower extremity swelling, asymmetry or tenderness.  Lymphadenopathy:    She has no cervical adenopathy.   Neurological: She is alert and oriented to person, place, and time.  Moves all extremities without focal deficit.  Sensation fully intact.  Skin: Skin is warm and dry. Capillary refill takes less than 2 seconds. No rash noted. She is not diaphoretic. No erythema.  Psychiatric: She has a normal mood and affect. Her behavior is normal.  Nursing note and vitals reviewed.    ED Treatments / Results  Labs (all labs ordered are listed, but only abnormal results are displayed) Labs Reviewed  CBC WITH DIFFERENTIAL/PLATELET - Abnormal; Notable for the following components:      Result Value   WBC 11.0 (*)    Monocytes Absolute 1.2 (*)    All other components within normal limits  COMPREHENSIVE METABOLIC PANEL - Abnormal; Notable for the following components:   Glucose, Bld 115 (*)    ALT 74 (*)    Total Bilirubin 1.4 (*)    All other components within normal limits  URINALYSIS, ROUTINE W REFLEX MICROSCOPIC - Abnormal; Notable for the following components:   Leukocytes, UA TRACE (*)    All other components within normal limits  URINALYSIS, MICROSCOPIC (REFLEX) - Abnormal; Notable for the following components:   Bacteria, UA FEW (*)    All other components within normal limits  LITHIUM LEVEL    EKG None  Radiology Dg Chest 2 View  Result Date: 06/18/2018 CLINICAL DATA:  Cough, congestion and fever. EXAM: CHEST - 2 VIEW COMPARISON:  06/16/1999 FINDINGS: Cardiomediastinal silhouette is normal. Mediastinal contours appear intact. There is no evidence of pleural effusion or pneumothorax. Volume loss in the right hemithorax with atelectasis, scarring versus peribronchial airspace consolidation in the right middle lobe. Osseous structures are without acute abnormality. Soft tissues are grossly normal. IMPRESSION: Volume loss in the right hemithorax with atelectasis, scarring versus peribronchial airspace consolidation in the right middle lobe. Electronically Signed   By: Fidela Salisbury  M.D.   On: 06/18/2018 20:14    Procedures Procedures (including critical care time)  Medications Ordered in ED Medications  levofloxacin (LEVAQUIN) tablet 750 mg (750 mg Oral Given 06/18/18 2055)  ibuprofen (ADVIL,MOTRIN) tablet 600 mg (600 mg Oral Given 06/18/18 2055)     Initial Impression / Assessment  and Plan / ED Course  I have reviewed the triage vital signs and the nursing notes.  Pertinent labs & imaging results that were available during my care of the patient were reviewed by me and considered in my medical decision making (see chart for details).     This is well-appearing.  No respiratory distress.  Maintaining saturations in the mid 90s.  Right middle lobe infiltrate on x-ray.  Does have elevation in white blood cell count which may be related to infection versus steroid use.  Given dose of Levaquin and will give prescription for 1 weeks worth.  Advised to follow closely with her primary physician.  Return precautions given.  Final Clinical Impressions(s) / ED Diagnoses   Final diagnoses:  Community acquired pneumonia of right middle lobe of lung [facility])    ED Discharge Orders         Ordered    levofloxacin (LEVAQUIN) 750 MG tablet  Daily     06/18/18 2106           Julianne Rice, MD 06/18/18 2107

## 2018-06-19 LAB — LITHIUM LEVEL: LITHIUM LVL: 0.4 mmol/L — AB (ref 0.60–1.20)

## 2018-06-21 ENCOUNTER — Ambulatory Visit: Payer: Self-pay | Admitting: Neurology

## 2018-06-22 ENCOUNTER — Ambulatory Visit: Payer: Self-pay | Admitting: *Deleted

## 2018-06-22 ENCOUNTER — Other Ambulatory Visit: Payer: Self-pay

## 2018-06-22 ENCOUNTER — Emergency Department (HOSPITAL_BASED_OUTPATIENT_CLINIC_OR_DEPARTMENT_OTHER)
Admission: EM | Admit: 2018-06-22 | Discharge: 2018-06-22 | Disposition: A | Discharge status: Home / Self Care | Payer: Medicare Other | Attending: Emergency Medicine | Admitting: Emergency Medicine

## 2018-06-22 ENCOUNTER — Encounter (HOSPITAL_BASED_OUTPATIENT_CLINIC_OR_DEPARTMENT_OTHER): Payer: Self-pay | Admitting: Emergency Medicine

## 2018-06-22 DIAGNOSIS — Z79899 Other long term (current) drug therapy: Secondary | ICD-10-CM | POA: Diagnosis not present

## 2018-06-22 DIAGNOSIS — T368X5A Adverse effect of other systemic antibiotics, initial encounter: Secondary | ICD-10-CM | POA: Diagnosis not present

## 2018-06-22 DIAGNOSIS — Z87891 Personal history of nicotine dependence: Secondary | ICD-10-CM | POA: Diagnosis not present

## 2018-06-22 DIAGNOSIS — K121 Other forms of stomatitis: Secondary | ICD-10-CM | POA: Insufficient documentation

## 2018-06-22 DIAGNOSIS — T7840XA Allergy, unspecified, initial encounter: Secondary | ICD-10-CM | POA: Diagnosis present

## 2018-06-22 MED ORDER — LIDOCAINE VISCOUS HCL 2 % MT SOLN: 15.0000 mL | OROMUCOSAL | 0 refills | Status: DC | PRN

## 2018-06-22 MED ORDER — DOXYCYCLINE HYCLATE 100 MG PO CAPS: 100.0000 mg | ORAL_CAPSULE | Freq: Two times a day (BID) | ORAL | 0 refills | Status: DC

## 2018-06-22 MED ORDER — DOXYCYCLINE HYCLATE 100 MG PO CAPS: 100.0000 mg | ORAL_CAPSULE | Freq: Two times a day (BID) | ORAL | 0 refills | Status: AC

## 2018-06-22 MED ORDER — LIDOCAINE VISCOUS HCL 2 % MT SOLN
15.0000 mL | Freq: Once | OROMUCOSAL | Status: AC
  Administered 2018-06-22: 15 mL via OROMUCOSAL
  Filled 2018-06-22: qty 15

## 2018-06-22 NOTE — ED Provider Notes (Signed)
[facility] EMERGENCY DEPARTMENT Provider Note   [name]: [id number] Arrival date & time: 06/22/18  1558     History   Chief Complaint Chief Complaint  Patient presents with  . Allergic Reaction    HPI [name] is a 57 y.o. female with history of bipolar disorder, ulcers, recently diagnosed community-acquired pneumonia is here for evaluation of pain in her mouth, throat.  Onset yesterday.  Pain is severe.  Has noticed redness and a lesion to her tongue.  She feels like this may be a reaction to the Levaquin she is taking for pneumonia.  She has taken a total of 5 doses.  She has never taken Levaquin before.  She does not have any known allergies to antibiotics.  States she can only tolerate sips of water, it is hurting and burning when she swallows into her throat and chest.  She did not want to stop taking Levaquin and took another dose this morning.  Other associated symptoms include diarrhea that began 4 days ago after her first dose of Levaquin and nausea.  Has been using nausea medicine to help with nausea and she has not vomited.  She denies any fevers, chills, shortness of breath, throat swelling or tightness, generalized painful rash, vomiting, blood in her stool, abdominal pain, headaches.  HPI  Past Medical History:  Diagnosis Date  . Anxiety   . Bipolar 1 disorder (HCC)    HX PSYCHOSIS  . Complication of anesthesia    POST AGGITATION  . Frequency of urination   . Headache(784.0)   . History of duodenal ulcer   . Hyperlipidemia   . IBS (irritable bowel syndrome)   . Nocturia   . Pelvic pain   . Personality disorder ([place])    W/ BORDERLINE FEATURES  . Urgency of urination     Patient Active Problem List   Diagnosis Date Noted  . Chronic fatigue 06/21/2017  . Hyperlipidemia, mixed 05/12/2017  . Insomnia 05/12/2017  . Multinodular goiter 05/12/2017  . Hoarseness of voice 05/12/2017  . Hyperprolactinemia ([place]) 06/26/2015  . Schizoaffective  disorder, bipolar type ([place]) 06/21/2015  . Borderline personality disorder ([place]) 06/20/2015  . Lithium toxicity 05/01/2014  . Leukocytosis 05/01/2014  . Costochondritis 02/14/2014  . Renal stone 02/13/2014  . Fatty liver disease, nonalcoholic 39/76/7341  . Abnormal LFTs 04/06/2013  . Dysuria 02/15/2013  . Medication side effect 01/07/2013  . Lumpy breasts 12/20/2012  . Lump of breast, left 12/20/2012  . Loss of weight 11/08/2012  . Food aversion 11/08/2012  . Constipation 05/17/2012  . Right ear pain 02/16/2012  . Jaw pain 02/13/2012  . Vitamin D deficiency 09/23/2011  . Loose stools 09/23/2011  . Double vision 03/15/2011  . Hyperglycemia 01/10/2011  . Dyspnea on effort 01/10/2011  . Palpitations 12/08/2010  . ENDOMETRIOSIS 05/11/2008  . DEFICIENCY, VITAMIN D NOS 06/11/2007  . PLANTAR FASCIITIS 06/11/2007  . ALLERGIC RHINITIS 03/26/2007    Past Surgical History:  Procedure Laterality Date  . CARDIAC CATHETERIZATION  10-09-1999  DR KATZ   NORMAL LVF/  NORMAL RCA/  NO CRITICAL DISEASE LEFT CORONARY SYSTEM  . CARDIOVASCULAR STRESS TEST  01-23-2011   NORMAL NUCLEAR STUDY/ NO ISCHEMIA/ EF 81%  . CYSTOSCOPY WITH BIOPSY N/A 06/03/2013   Procedure: CYSTOSCOPY WITH BIOPSY  INSTILLATION OF MARCAINE AND PYRIDIUM;  Surgeon: Hanley Ben, MD;  Location: [facility];  Service: Urology;  Laterality: N/A;  . LAPAROSCOPIC CHOLECYSTECTOMY  11-08-2001  . LAPAROSCOPIC LEFT SALPINGOOPHORECTOMY AND LYSYS ADHESIONS  03-10-2002  .  LAPAROSCOPIC REMOVAL OVARY REMNANT  2003   [place]  . LAPAROSCOPY N/A 12/14/2012   Procedure: LAPAROSCOPY DIAGNOSTIC;  Surgeon: Stark Klein, MD;  Location: [facility];  Service: General;  Laterality: N/A;  . TOTAL ABDOMINAL HYSTERECTOMY  2000   W/ RIGHT SALPINGOOPHORECTOMY  . TRANSTHORACIC ECHOCARDIOGRAM  10-13-2010   NORMAL LVF/  EF 55-60%     OB History   None      Home Medications    Prior to Admission medications   Medication Sig Start  Date End Date Taking? Authorizing Provider  hydrOXYzine (ATARAX/VISTARIL) 25 MG tablet Take by mouth. 06/27/15  Yes [provider]  ARIPiprazole (ABILIFY) 5 MG tablet Take 5 mg by mouth daily.    [provider]  buPROPion (WELLBUTRIN XL) 150 MG 24 hr tablet TAKE 3 TABLETS IN THE MORNING 08/26/17   [provider]  doxycycline (VIBRAMYCIN) 100 MG capsule Take 1 capsule (100 mg total) by mouth 2 (two) times daily for 5 days. 06/22/18 06/27/18  Kinnie Feil, PA-C  fluPHENAZine (PROLIXIN) 1 MG tablet Take 2 mg by mouth at bedtime. 04/24/18   [provider]  gabapentin (NEURONTIN) 300 MG capsule  11/16/17   [provider]  lidocaine (XYLOCAINE) 2 % solution Use as directed 15 mLs in the mouth or throat as needed for mouth pain. 06/22/18   Kinnie Feil, PA-C  lithium carbonate (LITHOBID) 300 MG CR tablet Take 2 tablets (600 mg total) by mouth every evening. Patient taking differently: Take 1 tablet by mouth in the morning and 2 tablets in the evening. 06/27/15   [place], Elyse Jarvis, FNP  LORazepam (ATIVAN) 0.5 MG tablet Take 1 tablet by mouth in the morning and 2 tablets by mouth in the evening.    [provider]  LORazepam (ATIVAN) 0.5 MG tablet Take 0.5 mg by mouth 2 (two) times daily. 79m at bedtime    [provider]  ondansetron (ZOFRAN) 4 MG tablet Take 1 tablet (4 mg total) by mouth every 8 (eight) hours as needed for nausea or vomiting. 11/18/17   J[place]ique [name], MD  PROAIR HFA 108 ([phone]) MCG/ACT inhaler Inhale 2 puffs into the lungs every 4 (four) hours as needed for wheezing or shortness of breath. 06/15/18   JMartinique [name], MD  zolpidem (AMBIEN) 10 MG tablet Take 10 mg by mouth at bedtime. 11/09/17   [provider]    Family History Family History  Problem Relation Age of Onset  . Sleep apnea Father   . Depression Father   . Alcohol abuse Father   . Hypertension Father   . Migraines Sister        headaches  .  Anxiety disorder Sister   . Other Mother        MAC infection  . Anxiety disorder Mother   . Dementia Mother   . Heart disease Paternal Grandmother   . Hyperlipidemia Paternal Grandmother   . Alcohol abuse Paternal Grandfather   . Diabetes Neg Hx     Social History Social History   Tobacco Use  . Smoking status: Former Smoker    Packs/day: 0.01    Years: 1.00    Pack years: 0.01    Types: Cigarettes    Last attempt to quit: 08/18/2009    Years since quitting: 8.8  . Smokeless tobacco: Never Used  . Tobacco comment: ONLY SMOKED FOR 6 MONTHS --  QUIT  YRS AGO  Substance Use Topics  . Alcohol use:  Yes    Comment: occ  . Drug use: No     Allergies   Morphine and related; Duloxetine; Latuda [lurasidone hcl]; and Morphine   Review of Systems Review of Systems  HENT: Positive for sore throat and trouble swallowing.        Mouth lesions   All other systems reviewed and are negative.    Physical Exam Updated Vital Signs BP 117/73 (BP Location: Right Arm)   Pulse 89   Temp 98.2 F (36.8 C) (Oral)   Resp 16   Ht 5' (1.524 m)   Wt 71.2 kg   SpO2 96%   BMI 30.66 kg/m   Physical Exam  Constitutional: She is oriented to person, place, and time. She appears well-developed and well-nourished.  Non toxic  HENT:  Head: Normocephalic and atraumatic.  Nose: Nose normal.  Slight erythema noted to posterior oropharynx and posterior tongue. 1 singular ulcerated tender lesion to right tongue.  Nasal mucosa normal.    Eyes: Pupils are equal, round, and reactive to light. Conjunctivae and EOM are normal.  Neck: Normal range of motion.  Cardiovascular: Normal rate and regular rhythm.  Pulmonary/Chest: Effort normal and breath sounds normal.  Abdominal: Soft. Bowel sounds are normal. There is no tenderness.  Musculoskeletal: Normal range of motion.  Neurological: She is alert and oriented to person, place, and time.  Skin: Skin is warm and dry. Capillary refill takes less than  2 seconds.  Psychiatric: She has a normal mood and affect. Her behavior is normal.  Nursing note and vitals reviewed.    ED Treatments / Results  Labs (all labs ordered are listed, but only abnormal results are displayed) Labs Reviewed - No data to display  EKG None  Radiology No results found.  Procedures Procedures (including critical care time)  Medications Ordered in ED Medications  lidocaine (XYLOCAINE) 2 % viscous mouth solution 15 mL (15 mLs Mouth/Throat Given 06/22/18 1739)     Initial Impression / Assessment and Plan / ED Course  I have reviewed the triage vital signs and the nursing notes.  Pertinent labs & imaging results that were available during my care of the patient were reviewed by me and considered in my medical decision making (see chart for details).     Considered dermatological emergency such as Stevens-Johnson syndrome however patient does not have any other symptomatology such as fevers, malaise, generalized painful rash.  On exam she has mild diffuse erythema to her tongue and oropharynx and one likely aphthous ulcer.  She is tolerating p.o.  She feels better after viscous lidocaine.  Etiology is unclear but at this time I do not feel there is emergent indication for extensive lab work, imaging or admission.  I discussed that etiology is unclear and discussed specific symptoms that would warrant immediate return for further evaluation.  We will discharge with soft diet, lidocaine, switch Levaquin to doxycycline.  Stressed importance to drink a large glass of water with each pill.  Patient is in agreement.  Patient shared with Dr. Rex Kras.  Final Clinical Impressions(s) / ED Diagnoses   Final diagnoses:  Stomatitis    ED Discharge Orders         Ordered    doxycycline (VIBRAMYCIN) 100 MG capsule  2 times daily     06/22/18 1903    lidocaine (XYLOCAINE) 2 % solution  As needed     06/22/18 1904           Kinnie Feil, Hershal Coria 06/22/18  Selz, Wenda Overland, MD 06/22/18 2329

## 2018-06-22 NOTE — ED Notes (Signed)
No angioedema noted. Speech, clear able to swallow. States painful sore on side of mouth and throat feels raw. Airway inact

## 2018-06-22 NOTE — ED Notes (Signed)
Pt and family understood dc material. NAD noted. Script given at dc 

## 2018-06-22 NOTE — ED Triage Notes (Signed)
Taking Levaquin for Pneumonia x5 days.  Mouth red and painful.  Pt sts this is a reaction to the Levaquin.

## 2018-06-22 NOTE — Discharge Instructions (Signed)
Your symptoms may be from drug reaction or viral illness. Canker sores usually come up when your immune system is weak or there is an illness.  We will treat this with viscous lidocaine, soft diet.  We will switch Levaquin to doxycycline.  Any antibiotic can cause mucous membrane irritation and stomach irritation, you should drink an entire 8 oz glass of water with each pill you take.   Return for fevers, chills, generalized painful rash, muscle or joint pains, throat swelling, difficulty breathing, shortness of breath, abdominal pain

## 2018-06-22 NOTE — Telephone Encounter (Signed)
Spoke with patient's wife and she will take her to the ED

## 2018-06-22 NOTE — Telephone Encounter (Signed)
Pt reports started Levaquin 06/18/18,after seen in ED:   'Community acquired pneumonia of right middle lobe of lung.'   States since Friday has had nausea, no vomiting, diarrhea. Taking Zofran for nausea and states is staying hydrated. States since yesterday, mouth and throat sore, no lesions. Denies swelling of throat, tongue, no SOB, no rash.  Pt states she has 3 doses left to take. Reports she did take dose this AM. Questioning if she should continue or alternate prescription  be called in.  Please advise: [phone]  Care advise given; instructed to CB if symptoms worsen, any swelling of tongue, throat, SOB.   Reason for Disposition . Pharmacy calling with prescription questions and triager unable to answer question  Answer Assessment - Initial Assessment Questions 1. SYMPTOMS: "Do you have any symptoms?"     Nausea, no vomiting, diarrhea, mouth and throat sore, no lesions 2. SEVERITY: If symptoms are present, ask "Are they mild, moderate or severe?"     Moderate  Protocols used: MEDICATION QUESTION CALL-A-AH

## 2018-06-23 ENCOUNTER — Inpatient Hospital Stay: Payer: Self-pay | Admitting: Family Medicine

## 2018-06-28 ENCOUNTER — Encounter: Payer: Self-pay | Admitting: Family Medicine

## 2018-06-28 ENCOUNTER — Ambulatory Visit (INDEPENDENT_AMBULATORY_CARE_PROVIDER_SITE_OTHER): Payer: Medicare Other | Admitting: Family Medicine

## 2018-06-28 VITALS — BP 124/83 | HR 102 | Temp 98.3°F | Resp 12 | Ht 60.0 in | Wt 159.5 lb

## 2018-06-28 DIAGNOSIS — N760 Acute vaginitis: Secondary | ICD-10-CM

## 2018-06-28 DIAGNOSIS — R110 Nausea: Secondary | ICD-10-CM

## 2018-06-28 DIAGNOSIS — J989 Respiratory disorder, unspecified: Secondary | ICD-10-CM

## 2018-06-28 DIAGNOSIS — J189 Pneumonia, unspecified organism: Secondary | ICD-10-CM | POA: Diagnosis not present

## 2018-06-28 DIAGNOSIS — R0989 Other specified symptoms and signs involving the circulatory and respiratory systems: Secondary | ICD-10-CM

## 2018-06-28 LAB — CBC WITH DIFFERENTIAL/PLATELET
Basophils Absolute: 0.1 10*3/uL (ref 0.0–0.1)
Basophils Relative: 0.6 % (ref 0.0–3.0)
Eosinophils Absolute: 0.5 10*3/uL (ref 0.0–0.7)
Eosinophils Relative: 6 % — ABNORMAL HIGH (ref 0.0–5.0)
HCT: 42 % (ref 36.0–46.0)
Hemoglobin: 13.8 g/dL (ref 12.0–15.0)
LYMPHS ABS: 1.8 10*3/uL (ref 0.7–4.0)
Lymphocytes Relative: 21.9 % (ref 12.0–46.0)
MCHC: 32.8 g/dL (ref 30.0–36.0)
MCV: 95 fl (ref 78.0–100.0)
MONOS PCT: 12.3 % — AB (ref 3.0–12.0)
Monocytes Absolute: 1 10*3/uL (ref 0.1–1.0)
NEUTROS ABS: 4.8 10*3/uL (ref 1.4–7.7)
NEUTROS PCT: 59.2 % (ref 43.0–77.0)
Platelets: 358 10*3/uL (ref 150.0–400.0)
RBC: 4.42 Mil/uL (ref 3.87–5.11)
RDW: 13.3 % (ref 11.5–15.5)
WBC: 8.2 10*3/uL (ref 4.0–10.5)

## 2018-06-28 MED ORDER — TERCONAZOLE 0.4 % VA CREA: 1.0000 | TOPICAL_CREAM | Freq: Every day | VAGINAL | 1 refills | Status: DC

## 2018-06-28 MED ORDER — ONDANSETRON HCL 4 MG PO TABS: 4.0000 mg | ORAL_TABLET | Freq: Two times a day (BID) | ORAL | 0 refills | Status: AC | PRN

## 2018-06-28 MED ORDER — OMEPRAZOLE 20 MG PO CPDR: 20.0000 mg | DELAYED_RELEASE_CAPSULE | Freq: Every day | ORAL | 3 refills | Status: DC

## 2018-06-28 MED ORDER — BUDESONIDE-FORMOTEROL FUMARATE 160-4.5 MCG/ACT IN AERO: 2.0000 | INHALATION_SPRAY | Freq: Two times a day (BID) | RESPIRATORY_TRACT | 3 refills | Status: DC

## 2018-06-28 NOTE — Patient Instructions (Addendum)
A few things to remember from today's visit:   Reactive airway disease that is not asthma - Plan: Ambulatory referral to Pulmonology  Community acquired pneumonia of right lung, unspecified part of lung - Plan: CBC with Differential/Platelet  Nausea without vomiting - Plan: ondansetron (ZOFRAN) 4 MG tablet   Please be sure medication list is accurate. If a new problem present, please set up appointment sooner than planned today.

## 2018-06-28 NOTE — Progress Notes (Signed)
HPI:   [name] is a 57 y.o. female, who is here today with her wife to follow on recent ER visits x 2.  She presented to the ER on 06/18/2018 and again on 06/22/2017. I saw her last on 06/15/2017 after ER visit, where she was diagnosed with acute bronchitis and started on azithromycin and prednisone. She presented to the ER on 06/18/2018 because worsening of respiratory symptoms and new onset of fever. She was treated with Levaquin for community-acquired pneumonia. She presented again to the ER on 06/22/2017 because of side effects from Levaquin, develop pain in oromucosa and throat + tender lesions on tongue. Levaquin was discontinued after 5 days of treatment and she was started on doxycycline.  She states that she is not feeling better, she still thinks she has an active infection because she is still feeling "weak." She is not sure about fever because she is taking Tylenol when she feels "feverish." Nonproductive cough, she denies hemoptysis. No wheezing or dyspnea. Albuterol inhaler has helped with cough.  Lab Results  Component Value Date   WBC 11.0 (H) 06/18/2018   HGB 12.8 06/18/2018   HCT 40.4 06/18/2018   MCV 96.9 06/18/2018   PLT 294 06/18/2018   CXR 06/18/18: Volume loss in the right hemithorax with atelectasis, scarring versus peribronchial airspace consolidation in the right middle lobe.   She is concerned about frequent episodes of "bronchitis" and persistent cough. 09/2017 I saw her for persistent cough, at that time she was sick since 07/2017. Intermittent wheezing, no history of asthma. Former smoker,quit about 30 years ago.  She is also concerned about possible bronchiectasis, her mother has been diagnosed with this problem.   Last visit she was referred to neurologist because 1 to 2 years of LE weakness LE's, cancelled appt.   -She is having "a little bit" of loose stools and upper abdominal pain, cramps, intermittently and mild. Nausea,  aggravated by food intake. She is requesting a prescription for Zofran, which she has taken before for similar symptoms. She attributes to GI symptoms to antibiotic treatment.  She has not noted melena or blood in the stool. No vomiting.  She is also requesting prescription for Monistat. "A few days" of vaginal pruritus and small amount of yellowish vaginal discharge. She denies vaginal bleeding or urinary symptoms.   Review of Systems  Constitutional: Positive for activity change and fatigue. Negative for appetite change and fever.  HENT: Positive for postnasal drip and rhinorrhea. Negative for mouth sores, sinus pressure, sore throat, trouble swallowing and voice change.   Eyes: Negative for discharge, redness and itching.  Respiratory: Positive for cough. Negative for shortness of breath and wheezing.   Cardiovascular: Negative for chest pain and leg swelling.  Gastrointestinal: Positive for abdominal pain, diarrhea and nausea. Negative for vomiting.  Genitourinary: Positive for vaginal discharge. Negative for decreased urine volume, dysuria, hematuria, pelvic pain, vaginal bleeding and vaginal pain.  Musculoskeletal: Negative for gait problem and myalgias.  Skin: Negative for rash.  Allergic/Immunologic: Negative for environmental allergies.  Hematological: Negative for adenopathy. Does not bruise/bleed easily.  Psychiatric/Behavioral: Negative for confusion. The patient is nervous/anxious.       Current Outpatient Medications on File Prior to Visit  Medication Sig Dispense Refill  . albuterol (PROVENTIL HFA;VENTOLIN HFA) 108 (90 Base) MCG/ACT inhaler Inhale into the lungs.    . ARIPiprazole (ABILIFY) 5 MG tablet Take 5 mg by mouth daily.    [name] Kitchen buPROPion (WELLBUTRIN XL) 150 MG 24  hr tablet TAKE 3 TABLETS IN THE MORNING  0  . Cholecalciferol (VITAMIN D) 50 MCG (2000 UT) tablet Take by mouth.    . gabapentin (NEURONTIN) 300 MG capsule Take by mouth.    . hydrOXYzine  (ATARAX/VISTARIL) 25 MG tablet Take by mouth.    . lidocaine (XYLOCAINE) 2 % solution Use as directed 15 mLs in the mouth or throat as needed for mouth pain. 100 mL 0  . lithium carbonate (LITHOBID) 300 MG CR tablet Take 2 tablets (600 mg total) by mouth every evening. (Patient taking differently: Take 1 tablet by mouth in the morning and 2 tablets in the evening.) 60 tablet 0  . LORazepam (ATIVAN) 0.5 MG tablet Take 0.5 mg by mouth 2 (two) times daily. 5mg  at bedtime    . zolpidem (AMBIEN) 10 MG tablet Take 10 mg by mouth at bedtime.  2   No current facility-administered medications on file prior to visit.      Past Medical History:  Diagnosis Date  . Anxiety   . Bipolar 1 disorder (HCC)    HX PSYCHOSIS  . Complication of anesthesia    POST AGGITATION  . Frequency of urination   . Headache(784.0)   . History of duodenal ulcer   . Hyperlipidemia   . IBS (irritable bowel syndrome)   . Nocturia   . Pelvic pain   . Personality disorder ([place])    W/ BORDERLINE FEATURES  . Urgency of urination    Allergies  Allergen Reactions  . Morphine And Related Nausea And Vomiting and Nausea Only  . Duloxetine     Other reaction(s): SWELLING/EDEMA  . Latuda [Lurasidone Hcl]   . Morphine Nausea Only    Social History   Socioeconomic History  . Marital status: Married    Spouse name: Not on file  . Number of children: 0  . Years of education: college  . Highest education level: Not on file  Occupational History  . Occupation: N/A    Comment: Nurse  Social Needs  . Financial resource strain: Not on file  . Food insecurity:    Worry: Not on file    Inability: Not on file  . Transportation needs:    Medical: Not on file    Non-medical: Not on file  Tobacco Use  . Smoking status: Former Smoker    Packs/day: 0.01    Years: 1.00    Pack years: 0.01    Types: Cigarettes    Last attempt to quit: 08/18/2009    Years since quitting: 8.8  . Smokeless tobacco: Never Used  . Tobacco  comment: ONLY SMOKED FOR 6 MONTHS --  QUIT  YRS AGO  Substance and Sexual Activity  . Alcohol use: Yes    Comment: occ  . Drug use: No  . Sexual activity: Not on file  Lifestyle  . Physical activity:    Days per week: Not on file    Minutes per session: Not on file  . Stress: Not on file  Relationships  . Social connections:    Talks on phone: Not on file    Gets together: Not on file    Attends religious service: Not on file    Active member of club or organization: Not on file    Attends meetings of clubs or organizations: Not on file    Relationship status: Not on file  Other Topics Concern  . 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    no tobacco   Caffeine Use: very little, three times a week    Vitals:   06/28/18 1154  BP: 124/83  Pulse: (!) 102  Resp: 12  Temp: 98.3 F (36.8 C)  SpO2: 95%   Body mass index is 31.15 kg/m.   Physical Exam  Nursing note and vitals reviewed. Constitutional: She is oriented to person, place, and time. She appears well-developed. She does not appear ill. No distress.  HENT:  Head: Normocephalic and atraumatic.  Mouth/Throat: Oropharynx is clear and moist. Mucous membranes are dry.  Eyes: Conjunctivae are normal.  Cardiovascular: Regular rhythm. Tachycardia present.  No murmur heard. Respiratory: Effort normal and breath sounds normal. No respiratory distress.  GI: Soft. Bowel sounds are normal. She exhibits no distension and no mass. There is no hepatomegaly. There is no tenderness.  Musculoskeletal: She exhibits no edema.  Lymphadenopathy:    She has no cervical adenopathy.  Neurological: She is alert and oriented to person, place, and time. She has normal strength. Gait normal.  Skin: Skin is warm. No rash noted. No erythema.  Psychiatric:  Flat affect. Well  groomed, good eye contact.    ASSESSMENT AND PLAN:  [name] was seen today for hospitalization follow-up.  Orders Placed This Encounter  Procedures  . CBC with Differential/Platelet  . Ambulatory referral to Pulmonology   Lab Results  Component Value Date   WBC 8.2 06/28/2018   HGB 13.8 06/28/2018   HCT 42.0 06/28/2018   MCV 95.0 06/28/2018   PLT 358.0 06/28/2018     Reactive airway disease that is not asthma We discussed possible etiologies, including possible COPD, viral illness among some. Referral to pulmonologist placed as requested. Recommend starting Symbicort twice daily. She will continue albuterol inhaler 2 puffs every 4-6 hours as needed. Instructed about warning signs.  -     Ambulatory referral to Pulmonology -     budesonide-formoterol (SYMBICORT) 160-4.5 MCG/ACT inhaler; Inhale 2 puffs into the lungs 2 (two) times daily.  Community acquired pneumonia of right lung, unspecified part of lung Vs atelectasis/scarring changes. Completed antibiotic treatment. We discussed side effects of antibiotics. Lung auscultation negative. Instructed about warning signs. Follow-up as needed.  -     CBC with Differential/Platelet  Nausea without vomiting ?  Dyspepsia. Recommend omeprazole 20 mg for 1 to 2 weeks. Zofran 4 mg twice daily as needed.  Also having mild diarrhea, most likely related to antibiotic.  For now I do not recommend further testing but she was instructed to let me know if diarrhea or abdominal pain gets worse or if it is persisting after 1 to 2 weeks of treatment. Instructed about warning signs. Follow-up as needed.  -     omeprazole (PRILOSEC) 20 MG capsule; Take 1 capsule (20 mg total) by mouth daily. -     ondansetron (ZOFRAN) 4 MG tablet; Take 1 tablet (4 mg total) by mouth 2 (two) times daily as needed for up to 5 days for nausea or vomiting.  Vulvovaginitis Based on history treated empirically for fungal etiology. Recommend topical  treatment. Diflucan could interact with some of her psychiatric medications, so not recommended today. Follow-up with gynecologist if needed. -     terconazole (TERAZOL 7) 0.4 % vaginal cream; Place 1 applicator vaginally at bedtime.     Felicita Nuncio G. [name], MD  [facility].  [place] office.

## 2018-06-29 ENCOUNTER — Encounter: Payer: Self-pay | Admitting: Family Medicine

## 2018-07-02 ENCOUNTER — Encounter: Payer: Self-pay | Admitting: Internal Medicine

## 2018-07-02 ENCOUNTER — Ambulatory Visit (INDEPENDENT_AMBULATORY_CARE_PROVIDER_SITE_OTHER): Payer: Medicare Other | Admitting: Internal Medicine

## 2018-07-02 DIAGNOSIS — Z23 Encounter for immunization: Secondary | ICD-10-CM

## 2018-07-02 DIAGNOSIS — J209 Acute bronchitis, unspecified: Secondary | ICD-10-CM

## 2018-07-02 NOTE — Progress Notes (Signed)
07/02/2018-57 year old female former smoker, retired Therapist, sports. Pt seen by RA 3 yrs ago for OSA- no sleep study done. She reports an NPSG at [facility] years ago was negative. ----- Pt has increase SOB with exertion, chest tightness, has hard time breathing at times, and has productive cough-yellow. Pt states when she lies flat on her back to sleep she gasps for air and gurgles at times. Medical problem list includes abnormal LFTs, allergic rhinitis, bipolar, She is here with her wife, saying she was referred because of pneumonia diagnosed 1 week ago.  Persistent chest pain x3 weeks relieved by inhaler and made worse by deep breath.  This was her only   She had had a bronchitis treated with Symbicort and gives history of recurrent bronchitis this summer.  Treated with Levaquin and then doxycycline.  Dry cough with tussive sternal pain, and wet breathing noise while supine have largely resolved. More aware of shortness of breath over the past year.  Nebulizer treatments helped temporarily. She wanted reassurance that she didn't have a major active concern, but hoped to minimize testing. Denies history of heart problems sinus disease or asthma.  Denies allergic rhinitis. Mother had history of bronchiectasis.  Prior to Admission medications   Medication Sig Start Date End Date Taking? Authorizing Provider  albuterol (PROVENTIL HFA;VENTOLIN HFA) 108 (90 Base) MCG/ACT inhaler Inhale into the lungs. 08/20/17 08/20/18 Yes [provider]  ARIPiprazole (ABILIFY) 5 MG tablet Take 5 mg by mouth daily.   Yes [provider]  budesonide-formoterol (SYMBICORT) 160-4.5 MCG/ACT inhaler Inhale 2 puffs into the lungs 2 (two) times daily. 06/28/18  Yes [name], [name], MD  buPROPion (WELLBUTRIN XL) 150 MG 24 hr tablet TAKE 3 TABLETS IN THE MORNING 08/26/17  Yes [provider]  Cholecalciferol (VITAMIN D) 50 MCG (2000 UT) tablet Take by mouth.   Yes [provider]  gabapentin (NEURONTIN) 300 MG  capsule Take by mouth.   Yes [provider]  hydrOXYzine (ATARAX/VISTARIL) 25 MG tablet Take by mouth. 06/27/15  Yes [provider]  lidocaine (XYLOCAINE) 2 % solution Use as directed 15 mLs in the mouth or throat as needed for mouth pain. 06/22/18  Yes Kinnie Feil, PA-C  lithium carbonate (LITHOBID) 300 MG CR tablet Take 2 tablets (600 mg total) by mouth every evening. Patient taking differently: Take 1 tablet by mouth in the morning and 2 tablets in the evening. 06/27/15  Yes [place], Elyse Jarvis, FNP  LORazepam (ATIVAN) 0.5 MG tablet Take 0.5 mg by mouth 2 (two) times daily. 18m at bedtime   Yes [provider]  omeprazole (PRILOSEC) 20 MG capsule Take 1 capsule (20 mg total) by mouth daily. 06/28/18  Yes JMartinique [name], MD  terconazole (TERAZOL 7) 0.4 % vaginal cream Place 1 applicator vaginally at bedtime. 06/28/18  Yes JMartinique [name], MD  zolpidem (AMBIEN) 10 MG tablet Take 10 mg by mouth at bedtime. 11/09/17  Yes [provider]   Past Medical History:  Diagnosis Date  . Anxiety   . Bipolar 1 disorder (HCC)    HX PSYCHOSIS  . Complication of anesthesia    POST AGGITATION  . Frequency of urination   . Headache(784.0)   . History of duodenal ulcer   . Hyperlipidemia   . IBS (irritable bowel syndrome)   . Nocturia   . Pelvic pain   . Personality disorder ([place]    W/ BORDERLINE FEATURES  . Urgency of urination    Past Surgical History:  Procedure  Laterality Date  . CARDIAC CATHETERIZATION  10-09-1999  DR KATZ   NORMAL LVF/  NORMAL RCA/  NO CRITICAL DISEASE LEFT CORONARY SYSTEM  . CARDIOVASCULAR STRESS TEST  01-23-2011   NORMAL NUCLEAR STUDY/ NO ISCHEMIA/ EF 81%  . CYSTOSCOPY WITH BIOPSY N/A 06/03/2013   Procedure: CYSTOSCOPY WITH BIOPSY  INSTILLATION OF MARCAINE AND PYRIDIUM;  Surgeon: Hanley Ben, MD;  Location: [place];  Service: Urology;  Laterality: N/A;  . LAPAROSCOPIC CHOLECYSTECTOMY  11-08-2001  . LAPAROSCOPIC  LEFT SALPINGOOPHORECTOMY AND LYSYS ADHESIONS  03-10-2002  . LAPAROSCOPIC REMOVAL OVARY REMNANT  2003   [place]  . LAPAROSCOPY N/A 12/14/2012   Procedure: LAPAROSCOPY DIAGNOSTIC;  Surgeon: Stark Klein, MD;  Location: [facility];  Service: General;  Laterality: N/A;  . TOTAL ABDOMINAL HYSTERECTOMY  2000   W/ RIGHT SALPINGOOPHORECTOMY  . TRANSTHORACIC ECHOCARDIOGRAM  10-13-2010   NORMAL LVF/  EF 55-60%   Family History  Problem Relation Age of Onset  . Sleep apnea Father   . Depression Father   . Alcohol abuse Father   . Hypertension Father   . Migraines Sister        headaches  . Anxiety disorder Sister   . Other Mother        MAC infection  . Anxiety disorder Mother   . Dementia Mother   . Heart disease Paternal Grandmother   . Hyperlipidemia Paternal Grandmother   . Alcohol abuse Paternal Grandfather   . Diabetes Neg Hx    Social History   Socioeconomic History  . Marital status: Married    Spouse name: Not on file  . Number of children: 0  . Years of education: college  . Highest education level: Not on file  Occupational History  . Occupation: N/A    Comment: Nurse  Social Needs  . Financial resource strain: Not on file  . Food insecurity:    Worry: Not on file    Inability: Not on file  . Transportation needs:    Medical: Not on file    Non-medical: Not on file  Tobacco Use  . Smoking status: Former Smoker    Packs/day: 0.01    Years: 1.00    Pack years: 0.01    Types: Cigarettes    Last attempt to quit: 1980    Years since quitting: 39.9  . Smokeless tobacco: Never Used  . Tobacco comment: ONLY SMOKED FOR 6 MONTHS --  QUIT  YRS AGO  Substance and Sexual Activity  . Alcohol use: Yes    Comment: occ  . Drug use: No  . Sexual activity: Not on file  Lifestyle  . Physical activity:    Days per week: Not on file    Minutes per session: Not on file  . Stress: Not on file  Relationships  . Social connections:    Talks on phone: Not on file    Gets  together: Not on file    Attends religious service: Not on file    Active member of club or organization: Not on file    Attends meetings of clubs or organizations: Not on file    Relationship status: Not on file  . Intimate partner violence:    Fear of current or ex partner: Not on file    Emotionally abused: Not on file    Physically abused: Not on file    Forced sexual activity: Not on file  Other Topics Concern  . 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    no tobacco   Caffeine Use: very little, three times a week   ROS-see HPI   + sign = positive Constitutional:    weight loss, night sweats, fevers, chills, fatigue, lassitude. HEENT:    +headaches, +difficulty swallowing, tooth/dental problems, +sore throat,       sneezing, itching, ear ache, nasal congestion, post nasal drip, snoring CV:    chest pain, orthopnea, PND, swelling in lower extremities, anasarca,                                  dizziness, palpitations Resp:   +shortness of breath with exertion or at rest.                productive cough,   +non-productive cough, coughing up of blood.              change in color of mucus.  wheezing.   Skin:    rash or lesions. GI:  No-   heartburn, indigestion, abdominal pain, nausea, vomiting, diarrhea,                 change in bowel habits, +loss of appetite GU: dysuria, change in color of urine, no urgency or frequency.   flank pain. MS:   joint pain, stiffness, decreased range of motion, back pain. Neuro-     nothing unusual Psych:  change in mood or affect. + depression or +anxiety.   memory loss.  OBJ- Physical Exam General- Alert, Oriented, Affect-appropriate, Distress- none acute, + obese Skin- rash-none, lesions- none, excoriation- none Lymphadenopathy- none Head- atraumatic            Eyes- Gross vision  intact, PERRLA, conjunctivae and secretions clear            Ears- Hearing, canals-normal            Nose- Clear, no-Septal dev, mucus, polyps, erosion, perforation             Throat- Mallampati IV , mucosa clear , drainage- none, tonsils- atrophic Neck- flexible , trachea midline, no stridor , thyroid nl, carotid no bruit Chest - symmetrical excursion , unlabored           Heart/CV- RRR , no murmur , no gallop  , no rub, nl s1 s2                           - JVD- none , edema- none, stasis changes- none, varices- none           Lung- clear to P&A, wheeze- none, cough- none , dullness-none, rub- none           Chest wall-  Abd-  Br/ Gen/ Rectal- Not done, not indicated Extrem- cyanosis- none, clubbing, none, atrophy- none, strength- nl Neuro- grossly intact to observation  CXR 06/18/2018- IMPRESSION: Volume loss in the right hemithorax with atelectasis, scarring versus peribronchial airspace consolidation in the right middle Lobe.

## 2018-07-02 NOTE — Patient Instructions (Addendum)
Order- Flu vax standard  Order- pneumovax-23 pneumonia vaccine  Please call if we can help

## 2018-07-10 ENCOUNTER — Encounter: Payer: Self-pay | Admitting: Internal Medicine

## 2018-07-10 DIAGNOSIS — J209 Acute bronchitis, unspecified: Secondary | ICD-10-CM

## 2018-07-10 HISTORY — DX: Acute bronchitis, unspecified: J20.9

## 2018-07-10 NOTE — Assessment & Plan Note (Addendum)
I am not sure about the time course from her story but it sounds as if she had a sustained bronchitis.  The available chest x-ray from November 1 shows only some scarring versus very slight right middle lobe consolidation..  She had what should have been adequate antibiotics.  She feels very much improved currently and her wife agrees she is near baseline. We agreed that if acute respiratory symptoms recur, then she should have more work-up, including imaging, PFT, possibly cultures. -Updated flu and pneumonia vaccines.  Discussed symptoms to watch for.  We can see her again as needed.

## 2018-07-21 ENCOUNTER — Ambulatory Visit (INDEPENDENT_AMBULATORY_CARE_PROVIDER_SITE_OTHER): Payer: Medicare Other | Admitting: Family Medicine

## 2018-07-21 ENCOUNTER — Telehealth: Payer: Self-pay | Admitting: Family Medicine

## 2018-07-21 ENCOUNTER — Encounter: Payer: Self-pay | Admitting: Family Medicine

## 2018-07-21 VITALS — BP 118/70 | HR 97 | Temp 98.5°F | Resp 12 | Ht 60.0 in | Wt 158.1 lb

## 2018-07-21 DIAGNOSIS — J029 Acute pharyngitis, unspecified: Secondary | ICD-10-CM | POA: Diagnosis not present

## 2018-07-21 DIAGNOSIS — B370 Candidal stomatitis: Secondary | ICD-10-CM | POA: Diagnosis not present

## 2018-07-21 DIAGNOSIS — M791 Myalgia, unspecified site: Secondary | ICD-10-CM

## 2018-07-21 LAB — POCT RAPID STREP A (OFFICE): RAPID STREP A SCREEN: NEGATIVE

## 2018-07-21 MED ORDER — NYSTATIN 100000 UNIT/ML MT SUSP: 5.0000 mL | Freq: Four times a day (QID) | OROMUCOSAL | 0 refills | Status: AC

## 2018-07-21 NOTE — Telephone Encounter (Signed)
Pt would like to see if Dr. [name] would let her know who the ENT referral will be going to.  She stated that she does not want to go to the ENT that she is presently going to (Dr. Mare Loan).  Pt would like to have a call when the referral has been placed.

## 2018-07-21 NOTE — Progress Notes (Signed)
ACUTE VISIT  HPI:  Chief Complaint  Patient presents with  . Sore Throat    x 1 -2 weeks ago  . Achy all over    [name] is a 57 y.o.female with history of depression, bipolar disorder, and chronic fatigue is here today complaining of a month of sore throat, which is worse in the morning and at night. She was treated for pneumonia on 06/18/2018, sore throat has been going on since then, it did not resolve with antibiotic treatment. She denies dysphasia, stridor, cough, or wheezing.  Subjective fever. Generalized myalgias, she has not noted joint edema or erythema.  Negative for new medication or Hx of fibromyalgia. No skin rash.  Problem seems to be stable.   Sore Throat   This is a new problem. The current episode started 1 to 4 weeks ago. The problem has been unchanged. The pain is worse on the right side. There has been no fever. Associated symptoms include congestion. Pertinent negatives include no coughing, diarrhea, ear discharge, ear pain, headaches, hoarse voice, plugged ear sensation, shortness of breath, stridor, swollen glands, trouble swallowing or vomiting. She has had no exposure to strep or mono. The treatment provided moderate relief.    No Hx of recent travel. No sick contact. No known insect bite. No history of tobacco use.  Hx of allergies: Yes  OTC medications for this problem: Lozenges.   Review of Systems  Constitutional: Positive for activity change and fatigue. Negative for fever.  HENT: Positive for congestion, postnasal drip and sore throat. Negative for ear discharge, ear pain, hoarse voice, mouth sores, sinus pressure, trouble swallowing and voice change.   Respiratory: Negative for cough, shortness of breath, wheezing and stridor.   Gastrointestinal: Negative for diarrhea, nausea and vomiting.  Musculoskeletal: Positive for myalgias. Negative for gait problem and joint swelling.  Skin: Negative for rash.    Allergic/Immunologic: Positive for environmental allergies.  Neurological: Negative for weakness and headaches.  Psychiatric/Behavioral: Negative for confusion. The patient is nervous/anxious.       Current Outpatient Medications on File Prior to Visit  Medication Sig Dispense Refill  . albuterol (PROVENTIL HFA;VENTOLIN HFA) 108 (90 Base) MCG/ACT inhaler Inhale into the lungs.    . ARIPiprazole (ABILIFY) 5 MG tablet Take 5 mg by mouth daily.    . budesonide-formoterol (SYMBICORT) 160-4.5 MCG/ACT inhaler Inhale 2 puffs into the lungs 2 (two) times daily. 1 Inhaler 3  . buPROPion (WELLBUTRIN XL) 150 MG 24 hr tablet TAKE 3 TABLETS IN THE MORNING  0  . Cholecalciferol (VITAMIN D) 50 MCG (2000 UT) tablet Take by mouth.    . gabapentin (NEURONTIN) 300 MG capsule Take by mouth.    . hydrOXYzine (ATARAX/VISTARIL) 25 MG tablet Take by mouth.    . lidocaine (XYLOCAINE) 2 % solution Use as directed 15 mLs in the mouth or throat as needed for mouth pain. 100 mL 0  . lithium carbonate (LITHOBID) 300 MG CR tablet Take 2 tablets (600 mg total) by mouth every evening. (Patient taking differently: Take 1 tablet by mouth in the morning and 2 tablets in the evening.) 60 tablet 0  . LORazepam (ATIVAN) 0.5 MG tablet Take 0.5 mg by mouth 2 (two) times daily. 5mg  at bedtime    . omeprazole (PRILOSEC) 20 MG capsule Take 1 capsule (20 mg total) by mouth daily. 30 capsule 3  . terconazole (TERAZOL 7) 0.4 % vaginal cream Place 1 applicator vaginally at bedtime. 45 g 1  .  zolpidem (AMBIEN) 10 MG tablet Take 10 mg by mouth at bedtime.  2   No current facility-administered medications on file prior to visit.      Past Medical History:  Diagnosis Date  . Anxiety   . Bipolar 1 disorder (HCC)    HX PSYCHOSIS  . Complication of anesthesia    POST AGGITATION  . Frequency of urination   . Headache(784.0)   . History of duodenal ulcer   . Hyperlipidemia   . IBS (irritable bowel syndrome)   . Nocturia   . Pelvic  pain   . Personality disorder ([place])    W/ BORDERLINE FEATURES  . Urgency of urination    Allergies  Allergen Reactions  . Morphine And Related Nausea And Vomiting and Nausea Only  . Duloxetine     Other reaction(s): SWELLING/EDEMA  . Latuda [Lurasidone Hcl]   . Morphine Nausea Only    Social History   Socioeconomic History  . Marital status: Married    Spouse name: Not on file  . Number of children: 0  . Years of education: college  . Highest education level: Not on file  Occupational History  . Occupation: N/A    Comment: Nurse  Social Needs  . Financial resource strain: Not on file  . Food insecurity:    Worry: Not on file    Inability: Not on file  . Transportation needs:    Medical: Not on file    Non-medical: Not on file  Tobacco Use  . Smoking status: Former Smoker    Packs/day: 0.01    Years: 1.00    Pack years: 0.01    Types: Cigarettes    Last attempt to quit: 1980    Years since quitting: 39.9  . Smokeless tobacco: Never Used  . Tobacco comment: ONLY SMOKED FOR 6 MONTHS --  QUIT  YRS AGO  Substance and Sexual Activity  . Alcohol use: Yes    Comment: occ  . Drug use: No  . Sexual activity: Not on file  Lifestyle  . Physical activity:    Days per week: Not on file    Minutes per session: Not on file  . Stress: Not on file  Relationships  . Social connections:    Talks on phone: Not on file    Gets together: Not on file    Attends religious service: Not on file    Active member of club or organization: Not on file    Attends meetings of clubs or organizations: Not on file    Relationship status: Not on file  Other Topics Concern  . 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    no tobacco   Caffeine Use: very little, three times a week    Vitals:   07/21/18 1451  BP: 118/70    Pulse: 97  Resp: 12  Temp: 98.5 F (36.9 C)  SpO2: 97%   Body mass index is 30.88 kg/m.  Physical Exam  Nursing note and vitals reviewed. Constitutional: She is oriented to person, place, and time. She appears well-developed. She does not appear ill. No distress.  HENT:  Head: Normocephalic and atraumatic.  Right Ear: Tympanic membrane, external ear and ear canal normal.  Left Ear: Tympanic membrane, external ear and ear canal normal.  Nose: Rhinorrhea present. Right sinus  exhibits no maxillary sinus tenderness and no frontal sinus tenderness. Left sinus exhibits no maxillary sinus tenderness and no frontal sinus tenderness.  Mouth/Throat: Uvula is midline and mucous membranes are normal. Posterior oropharyngeal erythema (Mild) present. No oropharyngeal exudate or posterior oropharyngeal edema.  Right side of soft palate whitish plaquer and mild on tongue.  There is no tongue tenderness or erythema.  Eyes: Conjunctivae are normal.  Neck: No muscular tenderness present. No edema and no erythema present.  Cardiovascular: Normal rate and regular rhythm.  No murmur heard. Respiratory: Effort normal and breath sounds normal. No stridor. No respiratory distress.  Musculoskeletal:  No signs of synovitis.  Lymphadenopathy:       Head (right side): No submandibular adenopathy present.       Head (left side): No submandibular adenopathy present.    She has no cervical adenopathy.  Neurological: She is alert and oriented to person, place, and time. She has normal strength.  Skin: Skin is warm. No rash noted. No erythema.  Psychiatric:  Flat affect.  Well groomed, good eye contact.     ASSESSMENT AND PLAN:   Ms. Hayley was seen today for sore throat and achy all over.  Diagnoses and all orders for this visit:  Sore throat -     POC Rapid Strep A  Pharyngitis, unspecified etiology We discussed possible etiologies. ?  Allergic, viral. Rapid strep here in the office  negative. Continue symptomatic treatment with throat lozenges. Instructed about warning signs. If problem is persistent for the next 2 to 3 weeks, we need to consider ENT evaluation.  She states that she has an ENT she has seen in the past.  Oral thrush Recently she completed a couple of abx treatments for URI. Mild. This probably could be causing or aggravating sore throat. Topical treatment with nystatin recommended. Follow-up as needed.  -     nystatin (MYCOSTATIN) 100000 UNIT/ML suspension; Take 5 mLs (500,000 Units total) by mouth 4 (four) times daily for 10 days.  Myalgia Possible etiology discussed. I am not sure if this is related with sore throat. On examination no signs of synovitis. For now recommend acetaminophen 500 mg 3 times daily as needed and continue monitoring problem. Because she has not started new medication and she is now on a statin medication, I do not think blood work is needed today.  According to patient her psychiatrist has been following lithium levels.    [name]. [name], MD  [facility]. [place] office.

## 2018-07-21 NOTE — Patient Instructions (Addendum)
A few things to remember from today's visit:   Sore throat - Plan: POC Rapid Strep A  Pharyngitis, unspecified etiology  Oral thrush - Plan: nystatin (MYCOSTATIN) 100000 UNIT/ML suspension  Myalgia   Symptomatic treatment: Over the counter Acetaminophen 500 mg and/or Ibuprofen (400-600 mg) if there is not contraindications; you can alternate in between both every 4-6 hours. Gargles with saline water and throat lozenges might also help. Cold fluids.    Seek prompt medical evaluation if you are having difficulty breathing, mouth swelling, throat closing up, not able to swallow liquids (drooling), skin rash/bruising, or worsening symptoms.  Please follow up in 2 weeks if not any better.    Please be sure medication list is accurate. If a new problem present, please set up appointment sooner than planned today.

## 2018-07-21 NOTE — Telephone Encounter (Signed)
Called patient and explained to her that if her symptoms do not get any better within 2 to 3 weeks, then she needed to call the office and a referral for ENT will be placed per Dr. [name]. Patient verbalized understanding.

## 2018-08-12 DIAGNOSIS — R5081 Fever presenting with conditions classified elsewhere: Secondary | ICD-10-CM | POA: Diagnosis not present

## 2018-08-12 DIAGNOSIS — J00 Acute nasopharyngitis [common cold]: Secondary | ICD-10-CM | POA: Diagnosis not present

## 2018-08-16 ENCOUNTER — Telehealth: Payer: Self-pay | Admitting: *Deleted

## 2018-08-16 NOTE — Telephone Encounter (Signed)
Copied from [place] [id number]. Topic: Referral - Request for Referral >> Aug 16, 2018  8:06 AM Ivar Drape wrote: Has patient seen PCP for this complaint? YES *If NO, is insurance requiring patient see PCP for this issue before PCP can refer them? Referral for which specialty:   ENT Preferred provider/office:  NO, but not Dr. Erik Obey Reason for referral: Consistent cough and sore throat

## 2018-08-17 ENCOUNTER — Encounter: Payer: Self-pay | Admitting: Family Medicine

## 2018-08-17 ENCOUNTER — Ambulatory Visit (INDEPENDENT_AMBULATORY_CARE_PROVIDER_SITE_OTHER): Payer: Medicare Other

## 2018-08-17 ENCOUNTER — Ambulatory Visit (INDEPENDENT_AMBULATORY_CARE_PROVIDER_SITE_OTHER): Payer: Medicare Other | Admitting: Family Medicine

## 2018-08-17 VITALS — BP 126/84 | HR 98 | Temp 98.8°F | Resp 12 | Ht 60.0 in | Wt 155.0 lb

## 2018-08-17 DIAGNOSIS — R059 Cough, unspecified: Secondary | ICD-10-CM

## 2018-08-17 DIAGNOSIS — R05 Cough: Secondary | ICD-10-CM | POA: Diagnosis not present

## 2018-08-17 DIAGNOSIS — J989 Respiratory disorder, unspecified: Secondary | ICD-10-CM

## 2018-08-17 DIAGNOSIS — R0989 Other specified symptoms and signs involving the circulatory and respiratory systems: Secondary | ICD-10-CM | POA: Diagnosis not present

## 2018-08-17 DIAGNOSIS — R0602 Shortness of breath: Secondary | ICD-10-CM

## 2018-08-17 DIAGNOSIS — J988 Other specified respiratory disorders: Secondary | ICD-10-CM

## 2018-08-17 LAB — CBC WITH DIFFERENTIAL/PLATELET
BASOS PCT: 0.5 % (ref 0.0–3.0)
Basophils Absolute: 0 10*3/uL (ref 0.0–0.1)
EOS PCT: 2 % (ref 0.0–5.0)
Eosinophils Absolute: 0.2 10*3/uL (ref 0.0–0.7)
HCT: 39.7 % (ref 36.0–46.0)
Hemoglobin: 13.2 g/dL (ref 12.0–15.0)
Lymphocytes Relative: 13.8 % (ref 12.0–46.0)
Lymphs Abs: 1.1 10*3/uL (ref 0.7–4.0)
MCHC: 33.2 g/dL (ref 30.0–36.0)
MCV: 94.8 fl (ref 78.0–100.0)
MONO ABS: 1.1 10*3/uL — AB (ref 0.1–1.0)
MONOS PCT: 13 % — AB (ref 3.0–12.0)
Neutro Abs: 5.8 10*3/uL (ref 1.4–7.7)
Neutrophils Relative %: 70.7 % (ref 43.0–77.0)
Platelets: 243 10*3/uL (ref 150.0–400.0)
RBC: 4.19 Mil/uL (ref 3.87–5.11)
RDW: 12.8 % (ref 11.5–15.5)
WBC: 8.2 10*3/uL (ref 4.0–10.5)

## 2018-08-17 MED ORDER — PREDNISONE 20 MG PO TABS: ORAL_TABLET | ORAL | 0 refills | Status: DC

## 2018-08-17 MED ORDER — HYDROCODONE-HOMATROPINE 5-1.5 MG/5ML PO SYRP: 5.0000 mL | ORAL_SOLUTION | Freq: Two times a day (BID) | ORAL | 0 refills | Status: AC | PRN

## 2018-08-17 NOTE — Telephone Encounter (Signed)
I think pulmonologist will be more appropriate becomes recurrent cough, wheezing, and dyspnea. Thanks, BJ

## 2018-08-17 NOTE — Progress Notes (Signed)
ACUTE VISIT  HPI:  Chief Complaint  Patient presents with  . Chest congestion    started last week, went to urgent care  . Nasal Congestion  . Cough    [name] is a 57 y.o.female here today with his wife complaining of 7 days days of respiratory symptoms.  She was evaluated in urgent care on 08/12/2017, she was diagnosed with acute nasopharyngitis. Rapid flu and strep test were done, both negative.  Productive cough, denies hemoptysis. Nasal congestion, rhinorrhea, and postnasal drainage.  She received dexamethasone 10 mg IM and discharged on Z-Pak and nasal Atrovent.   She has not noted fever but has had some chills. Intermittent wheezing and dyspnea, both exacerbated by mild to moderate exertion. Denies associated chest pain. Last time she used albuterol inhaler was last night.  Initially she had sore throat but it has resolved. Denies dysphagia or stridor.   Cough  This is a new problem. The current episode started in the past 7 days. The problem has been unchanged. The cough is productive of sputum. Associated symptoms include chills, ear congestion, ear pain, myalgias, nasal congestion, postnasal drip, rhinorrhea, shortness of breath and wheezing. Pertinent negatives include no chest pain, eye redness, headaches, heartburn, hemoptysis or rash. The symptoms are aggravated by exercise and lying down. She has tried a beta-agonist inhaler and OTC cough suppressant for the symptoms. The treatment provided mild relief. Her past medical history is significant for bronchitis and environmental allergies.    No Hx of recent travel. No known sick contacts but her wife states that she visited her mother recently, who is in assisted living.  She wonders if she was exposed something during her visit. No known insect bite.  Hx of allergies: History of allergy rhinitis, she is not on treatment at this time. She also has had episodes of wheezing and dyspnea with  prior URI.  Currently she is on albuterol inhaler and Symbicort 160-4.5 mcg.  Former smoker. OTC medications for this problem: Alka-Seltzer cold and sinuses and Mucinex.  Review of Systems  Constitutional: Positive for activity change, appetite change, chills and fatigue.  HENT: Positive for congestion, ear pain, postnasal drip, rhinorrhea and sinus pressure. Negative for ear discharge, hearing loss, mouth sores, trouble swallowing and voice change.   Eyes: Negative for discharge and redness.  Respiratory: Positive for cough, shortness of breath and wheezing. Negative for hemoptysis.   Cardiovascular: Negative for chest pain.  Gastrointestinal: Negative for abdominal pain, diarrhea, heartburn, nausea and vomiting.  Musculoskeletal: Positive for myalgias. Negative for gait problem, joint swelling and neck pain.  Skin: Negative for rash.  Allergic/Immunologic: Positive for environmental allergies.  Neurological: Negative for weakness and headaches.  Hematological: Negative for adenopathy. Does not bruise/bleed easily.      Current Outpatient Medications on File Prior to Visit  Medication Sig Dispense Refill  . albuterol (PROVENTIL HFA;VENTOLIN HFA) 108 (90 Base) MCG/ACT inhaler Inhale into the lungs.    . budesonide-formoterol (SYMBICORT) 160-4.5 MCG/ACT inhaler Inhale 2 puffs into the lungs 2 (two) times daily. 1 Inhaler 3  . buPROPion (WELLBUTRIN XL) 150 MG 24 hr tablet TAKE 3 TABLETS IN THE MORNING  0  . Cholecalciferol (VITAMIN D) 50 MCG (2000 UT) tablet Take by mouth.    . gabapentin (NEURONTIN) 300 MG capsule Take by mouth.    . hydrOXYzine (ATARAX/VISTARIL) 25 MG tablet Take by mouth.    . lidocaine (XYLOCAINE) 2 % solution Use as directed 15 mLs in  the mouth or throat as needed for mouth pain. 100 mL 0  . lithium carbonate (LITHOBID) 300 MG CR tablet Take 2 tablets (600 mg total) by mouth every evening. (Patient taking differently: Take 1 tablet by mouth in the morning and 2  tablets in the evening.) 60 tablet 0  . LORazepam (ATIVAN) 0.5 MG tablet Take 0.5 mg by mouth 2 (two) times daily. 5mg  at bedtime    . omeprazole (PRILOSEC) 20 MG capsule Take 1 capsule (20 mg total) by mouth daily. 30 capsule 3  . terconazole (TERAZOL 7) 0.4 % vaginal cream Place 1 applicator vaginally at bedtime. 45 g 1  . zolpidem (AMBIEN) 10 MG tablet Take 10 mg by mouth at bedtime.  2   No current facility-administered medications on file prior to visit.      Past Medical History:  Diagnosis Date  . Anxiety   . Bipolar 1 disorder (HCC)    HX PSYCHOSIS  . Complication of anesthesia    POST AGGITATION  . Frequency of urination   . Headache(784.0)   . History of duodenal ulcer   . Hyperlipidemia   . IBS (irritable bowel syndrome)   . Nocturia   . Pelvic pain   . Personality disorder ([place])    W/ BORDERLINE FEATURES  . Urgency of urination    Allergies  Allergen Reactions  . Morphine And Related Nausea And Vomiting and Nausea Only  . Duloxetine     Other reaction(s): SWELLING/EDEMA  . Latuda [Lurasidone Hcl]   . Morphine Nausea Only    Social History   Socioeconomic History  . Marital status: Married    Spouse name: Not on file  . Number of children: 0  . Years of education: college  . Highest education level: Not on file  Occupational History  . Occupation: N/A    Comment: Nurse  Social Needs  . Financial resource strain: Not on file  . Food insecurity:    Worry: Not on file    Inability: Not on file  . Transportation needs:    Medical: Not on file    Non-medical: Not on file  Tobacco Use  . Smoking status: Former Smoker    Packs/day: 0.01    Years: 1.00    Pack years: 0.01    Types: Cigarettes    Last attempt to quit: 1980    Years since quitting: 40.0  . Smokeless tobacco: Never Used  . Tobacco comment: ONLY SMOKED FOR 6 MONTHS --  QUIT  YRS AGO  Substance and Sexual Activity  . Alcohol use: Yes    Comment: occ  . Drug use: No  . Sexual  activity: Not on file  Lifestyle  . Physical activity:    Days per week: Not on file    Minutes per session: Not on file  . Stress: Not on file  Relationships  . Social connections:    Talks on phone: Not on file    Gets together: Not on file    Attends religious service: Not on file    Active member of club or organization: Not on file    Attends meetings of clubs or organizations: Not on file    Relationship status: Not on file  Other Topics Concern  . 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  no tobacco   Caffeine Use: very little, three times a week    Vitals:   08/17/18 1034  BP: 126/84  Pulse: 98  Resp: 12  Temp: 98.8 F (37.1 C)  SpO2: 95%   Body mass index is 30.27 kg/m.   Physical Exam  Nursing note and vitals reviewed. Constitutional: She is oriented to person, place, and time. She appears well-developed. She does not appear ill. No distress.  HENT:  Head: Atraumatic.  Right Ear: Tympanic membrane, external ear and ear canal normal.  Left Ear: Tympanic membrane, external ear and ear canal normal.  Nose: Rhinorrhea present. Right sinus exhibits no maxillary sinus tenderness and no frontal sinus tenderness. Left sinus exhibits no maxillary sinus tenderness and no frontal sinus tenderness.  Mouth/Throat: Oropharynx is clear and moist and mucous membranes are normal.  Nasal voice. Postnasal drainage.   Eyes: Conjunctivae are normal.  Cardiovascular: Normal rate and regular rhythm.  No murmur heard. Respiratory: Effort normal. No stridor. No respiratory distress. She has wheezes. She has rhonchi. She has no rales.  Lymphadenopathy:       Head (right side): No submandibular adenopathy present.       Head (left side): No submandibular adenopathy present.    She has no cervical adenopathy.    Neurological: She is alert and oriented to person, place, and time. She has normal strength.  Skin: Skin is warm. No rash noted. No erythema.  Psychiatric:  Flat affect. Fairly groomed, good eye contact.    ASSESSMENT AND PLAN:  Ms. Layne was seen today for chest congestion, nasal congestion and cough.  Diagnoses and all orders for this visit:  Lab Results  Component Value Date   WBC 8.2 08/17/2018   HGB 13.2 08/17/2018   HCT 39.7 08/17/2018   MCV 94.8 08/17/2018   PLT 243.0 08/17/2018    Reactive airway disease that is not asthma She has had similar episodes with prior respiratory infections. ?  COPD. Sporadic wheezing on auscultation that improved but not resolved with DuoNeb neb treatment. Albuterol inh 2 puff every 6 hours for a week then as needed for wheezing or shortness of breath.  She has taken prednisone in the past and has tolerated it well, we discussed some side effects. Follow-up in 10 days, before if needed.  -     predniSONE (DELTASONE) 20 MG tablet; 3 tabs for 3 days, 2 tabs for 3 days, 1 tabs for 3 days, and 1/2 tab for 3 days. Take tables together with breakfast.  Short of breath on exertion She is not in acute respiratory distress, O2 sat and RR otherwise normal. Recommend continuing albuterol inhaler. Prednisone added today. Clearly instructed about warning signs. Follow-up in 10 days, before if needed.  -     DG Chest 2 View; Future -     CBC with Differential/Platelet -     DG Chest 2 View  Cough Explained that cough and congestion can last a few days and even weeks after acute URI. We discussed some side effects of hydrocodone. Adequate hydration. OTC Mucinex may also help.  -     HYDROcodone-homatropine (HYCODAN) 5-1.5 MG/5ML syrup; Take 5 mLs by mouth every 12 (twelve) hours as needed for up to 10 days for cough.  Respiratory tract infection Completed antibiotic treatment. On auscultation rhonchi, more pronounced in right lung and cleared  with coughing. Today she is not in respiratory distress.   I reviewed CXR  ? Of right base infiltrates, more pronounce when compared  with prior CXR (06/18/18)? Atelectasia. Chronic bronchitis changes. Right hemidiaphragm elevation, stable when compared with prior CXR. Pending formal report.      [name] G. [name], MD  [facility]. [place] office.

## 2018-08-17 NOTE — Patient Instructions (Signed)
A few things to remember from today's visit:   Short of breath on exertion  Reactive airway disease that is not asthma   Please be sure medication list is accurate. If a new problem present, please set up appointment sooner than planned today.

## 2018-08-19 NOTE — Telephone Encounter (Signed)
Patient is aware and verbally agreed to the referral for pulmonology.   Referral already placed 06/28/18.

## 2018-08-23 DIAGNOSIS — F29 Unspecified psychosis not due to a substance or known physiological condition: Secondary | ICD-10-CM | POA: Diagnosis not present

## 2018-09-06 DIAGNOSIS — F3131 Bipolar disorder, current episode depressed, mild: Secondary | ICD-10-CM | POA: Diagnosis not present

## 2018-09-30 DIAGNOSIS — F3131 Bipolar disorder, current episode depressed, mild: Secondary | ICD-10-CM | POA: Diagnosis not present

## 2018-09-30 DIAGNOSIS — F29 Unspecified psychosis not due to a substance or known physiological condition: Secondary | ICD-10-CM | POA: Diagnosis not present

## 2018-10-07 NOTE — Progress Notes (Signed)
[facility] Clinic Note - Initial Visit   Date: 10/08/18  [name] MRN: [id number] DOB: 1961/02/03   Dear Dr. [name]:  Thank you for your kind referral of [name] for consultation of leg weakness. Although her history is well known to you, please allow [place] to reiterate it for the purpose of our medical record. The patient was accompanied to the clinic by partner who also provides collateral information.     History of Present Illness: [name] is a 58 y.o. left-handed Caucasian female with bipolar depression, anxiety, GERD, hyperlipidemia, and IBS presenting for evaluation of bilateral leg weakness and generalized fatigue.   Starting around 2015, she began having weakness of the upper legs which has been intermittent and became worse over the past year. Specifically, she has difficulty climbing stairs and walking for extended periods.  Sometimes, her partner states that she will walk even faster because she feels unsteady.   She does not use a cane.   She has suffered 10 falls over the past year, she usually needs help to stand up.  She has not had any ER visit due to injuries. She does not have numbness/tingling of the legs, low back pain, or pain in the legs.  She completed physical therapy about 6-9 months ago, but she does not appreciate much improvement.  Upon further discussion, her partner states that she had a psychotic episode about 5 years ago and everything has been worse since then. She feels scared and afraid to walk. She sees psychiatry and a Social worker.  She is on disability for bipolar disease since this time.    In the past, she has been evaluated neurologists at [facility] and [facility] for various neurological symptoms, including weakness.  Imaging excluded possibility for multiple sclerosis and NCS/EMG with repetitive nerve stimulation excluded neuromuscular junction disorder.     Out-side paper records, electronic medical  record, and images have been reviewed where available and summarized as:  MRI brain 09/23/2014:  Abnormal MRI scan of the brain showing tiny bilateral subcortical and periventricular nonspecific white matter hyperintensities with the differential discussed above. No enhancing lesions are noted.  MRI cervical and thoracic spine wwo contrast 06/11/2011:  Negative cervical and thoracic spine MRI.  No evidence of multiple sclerosis. Central canal and foramina widely patent all levels.  NCS/EMG of the right arm and leg 09/21/2013:  Nerve conduction studies done on both upper extremities and on the right lower extremity were normal. No evidence of a peripheral neuropathy is seen. EMG evaluation was not done. Repetitive nerve stimulation on the right hand was done at 3 Hz stimulation, and again at 40 Hz stimulation without evidence of a neuromuscular transmission disorder. Repetitive nerve stimulation studies were completely normal.  Lab Results  Component Value Date   HGBA1C 5.7 (H) 06/21/2015   Lab Results  Component Value Date   IONGEXBM84 132 05/06/2014   Lab Results  Component Value Date   TSH 3.07 10/14/2017   Lab Results  Component Value Date   ESRSEDRATE 1 10/13/2010     Past Medical History:  Diagnosis Date  . Anxiety   . Bipolar 1 disorder (HCC)    HX PSYCHOSIS  . Complication of anesthesia    POST AGGITATION  . Frequency of urination   . Headache(784.0)   . History of duodenal ulcer   . Hyperlipidemia   . IBS (irritable bowel syndrome)   . Nocturia   . Pelvic pain   . Personality  disorder ([place])    W/ BORDERLINE FEATURES  . Urgency of urination     Past Surgical History:  Procedure Laterality Date  . CARDIAC CATHETERIZATION  10-09-1999  DR KATZ   NORMAL LVF/  NORMAL RCA/  NO CRITICAL DISEASE LEFT CORONARY SYSTEM  . CARDIOVASCULAR STRESS TEST  01-23-2011   NORMAL NUCLEAR STUDY/ NO ISCHEMIA/ EF 81%  . CYSTOSCOPY WITH BIOPSY N/A 06/03/2013   Procedure: CYSTOSCOPY WITH  BIOPSY  INSTILLATION OF MARCAINE AND PYRIDIUM;  Surgeon: Hanley Ben, MD;  Location: [place];  Service: Urology;  Laterality: N/A;  . LAPAROSCOPIC CHOLECYSTECTOMY  11-08-2001  . LAPAROSCOPIC LEFT SALPINGOOPHORECTOMY AND LYSYS ADHESIONS  03-10-2002  . LAPAROSCOPIC REMOVAL OVARY REMNANT  2003   [place]  . LAPAROSCOPY N/A 12/14/2012   Procedure: LAPAROSCOPY DIAGNOSTIC;  Surgeon: Stark Klein, MD;  Location: [facility];  Service: General;  Laterality: N/A;  . TOTAL ABDOMINAL HYSTERECTOMY  2000   W/ RIGHT SALPINGOOPHORECTOMY  . TRANSTHORACIC ECHOCARDIOGRAM  10-13-2010   NORMAL LVF/  EF 55-60%     Medications:  Outpatient Encounter Medications as of 10/08/2018  Medication Sig  . budesonide-formoterol (SYMBICORT) 160-4.5 MCG/ACT inhaler Inhale 2 puffs into the lungs 2 (two) times daily.  [name] Kitchen buPROPion (WELLBUTRIN XL) 150 MG 24 hr tablet TAKE 3 TABLETS IN THE MORNING  . Cholecalciferol (VITAMIN D) 50 MCG (2000 UT) tablet Take by mouth.  . gabapentin (NEURONTIN) 300 MG capsule Take by mouth at bedtime.   . hydrOXYzine (ATARAX/VISTARIL) 25 MG tablet Take by mouth.  . lithium carbonate (LITHOBID) 300 MG CR tablet Take 2 tablets (600 mg total) by mouth every evening. (Patient taking differently: Take 1 tablet by mouth in the morning and 2 tablets in the evening.)  . LORazepam (ATIVAN) 0.5 MG tablet Take 0.5 mg by mouth 2 (two) times daily. 55m at bedtime  . albuterol (PROVENTIL HFA;VENTOLIN HFA) 108 (90 Base) MCG/ACT inhaler Inhale into the lungs.  . [DISCONTINUED] lidocaine (XYLOCAINE) 2 % solution Use as directed 15 mLs in the mouth or throat as needed for mouth pain.  . [DISCONTINUED] omeprazole (PRILOSEC) 20 MG capsule Take 1 capsule (20 mg total) by mouth daily.  . [DISCONTINUED] predniSONE (DELTASONE) 20 MG tablet 3 tabs for 3 days, 2 tabs for 3 days, 1 tabs for 3 days, and 1/2 tab for 3 days. Take tables together with breakfast.  . [DISCONTINUED] terconazole (TERAZOL 7) 0.4 %  vaginal cream Place 1 applicator vaginally at bedtime.  . [DISCONTINUED] zolpidem (AMBIEN) 10 MG tablet Take 10 mg by mouth at bedtime.   No facility-administered encounter medications on file as of 10/08/2018.     Allergies:  Allergies  Allergen Reactions  . Morphine And Related Nausea And Vomiting and Nausea Only  . Duloxetine     Other reaction(s): SWELLING/EDEMA  . Latuda [Lurasidone Hcl]   . Morphine Nausea Only    Family History: Family History  Problem Relation Age of Onset  . Sleep apnea Father   . Depression Father   . Alcohol abuse Father   . Hypertension Father   . Migraines Sister        headaches  . Anxiety disorder Sister   . Other Mother        MAC infection  . Anxiety disorder Mother   . Dementia Mother   . Heart disease Paternal Grandmother   . Hyperlipidemia Paternal Grandmother   . Alcohol abuse Paternal Grandfather   . Diabetes Neg Hx     Social History: Social  History   Tobacco Use  . Smoking status: Former Smoker    Packs/day: 0.01    Years: 1.00    Pack years: 0.01    Types: Cigarettes    Last attempt to quit: 1980    Years since quitting: 40.1  . Smokeless tobacco: Never Used  . Tobacco comment: ONLY SMOKED FOR 6 MONTHS --  QUIT  YRS AGO  Substance Use Topics  . Alcohol use: Yes    Comment: occ  . 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    no tobacco   Caffeine Use: very little, three times a week    Review of Systems:  CONSTITUTIONAL: No fevers, chills, night sweat or weight loss.   EYES: No visual changes or eye pain ENT: No hearing changes.  No history of nose bleeds.   RESPIRATORY: No cough, wheezing and shortness of breath.   CARDIOVASCULAR: Negative for chest pain, and palpitations.   GI: Negative for abdominal discomfort, blood in stools  or black stools.  No recent change in bowel habits.   GU:  No history of incontinence.   MUSCLOSKELETAL: No history of joint pain or swelling.  No myalgias.   SKIN: Negative for lesions, rash, and itching.   HEMATOLOGY/ONCOLOGY: Negative for prolonged bleeding, bruising easily, and swollen nodes.  No history of cancer.   ENDOCRINE: Negative for cold or heat intolerance, polydipsia or goiter.   PSYCH:  +depression or anxiety symptoms.   NEURO: As Above.   Vital Signs:  BP 140/80   Pulse 88   Ht 5' (1.524 m)   Wt 154 lb (69.9 kg)   SpO2 98%   BMI 30.08 kg/m    General Medical Exam:   General:  Well appearing, comfortable.   Eyes/ENT: see cranial nerve examination.   Neck:   No carotid bruits. Respiratory:  Clear to auscultation, good air entry bilaterally.   Cardiac:  Regular rate and rhythm, no murmur.   Extremities:  No deformities, edema, or skin discoloration.  Skin:  No rashes or lesions.  Neurological Exam: MENTAL STATUS including orientation to time, place, person, recent and remote memory, attention span and concentration, language, and fund of knowledge is normal.  Speech is not dysarthric.  CRANIAL NERVES: II:  No visual field defects.  Unremarkable fundi.   III-IV-VI: Pupils equal round and reactive to light.  Normal conjugate, extra-ocular eye movements in all directions of gaze.  No nystagmus.  No ptosis.   V:  Normal facial sensation.    VII:  Normal facial symmetry and movements.   VIII:  Normal hearing and vestibular function.   IX-X:  Normal palatal movement.   XI:  Normal shoulder shrug and head rotation.   XII:  Normal tongue strength and range of motion, no deviation or fasciculation.  MOTOR:  No atrophy, fasciculations or abnormal movements.  No pronator drift.   Right Upper Extremity:    Left Upper Extremity:    Deltoid  5/5   Deltoid  5/5   Biceps  5/5   Biceps  5/5   Triceps  5/5   Triceps  5/5   Wrist extensors  5/5   Wrist extensors  5/5   Wrist  flexors  5/5   Wrist flexors  5/5   Finger extensors  5/5  Finger extensors  5/5   Finger flexors  5/5   Finger flexors  5/5   Dorsal interossei  5/5   Dorsal interossei  5/5   Abductor pollicis  5/5   Abductor pollicis  5/5   Tone (Ashworth scale)  0  Tone (Ashworth scale)  0   Right Lower Extremity:    Left Lower Extremity:    Hip flexors  5/5   Hip flexors  5/5   Hip extensors  5/5   Hip extensors  5/5   Knee flexors  5/5   Knee flexors  5/5   Knee extensors  5/5   Knee extensors  5/5   Dorsiflexors  5/5   Dorsiflexors  5/5   Plantarflexors  5/5   Plantarflexors  5/5   Toe extensors  5/5   Toe extensors  5/5   Toe flexors  5/5   Toe flexors  5/5   Tone (Ashworth scale)  0  Tone (Ashworth scale)  0   MSRs:  Right                                                                 Left brachioradialis 3+  brachioradialis 3+  biceps 3+  biceps 3+  triceps 3+  triceps 3+  patellar 3+  patellar 3+  ankle jerk 2+  ankle jerk 2+  Hoffman no  Hoffman no  plantar response down  plantar response down   SENSORY:  Normal and symmetric perception of light touch, pinprick, vibration, and proprioception.    COORDINATION/GAIT: Normal finger-to- nose-finger.  Intact rapid alternating movements bilaterally.  Gait narrow based and stable. Unsteady with tandem gait.  Stressed gait intact.  IMPRESSION: Episodic subjective bilateral leg weakness.  Exam shows intact strength, preserved muscle bulk, and no abnormal movements.  She does not hyperreflexia in the arms and legs and I will check MRI cervical spine to look for structural pathology.  She may also need MRI lumbar spine going forward to evaluate for neurogenic claudication.  Check vitamin B12, copper, zinc.    Further recommendations pending results.    Thank you for allowing me to participate in patient's care.  If I can answer any additional questions, I would be pleased to do so.    Sincerely,    Hadley Soileau K. Posey Pronto, DO

## 2018-10-08 ENCOUNTER — Ambulatory Visit (INDEPENDENT_AMBULATORY_CARE_PROVIDER_SITE_OTHER): Payer: Medicare Other | Admitting: Neurology

## 2018-10-08 ENCOUNTER — Other Ambulatory Visit (INDEPENDENT_AMBULATORY_CARE_PROVIDER_SITE_OTHER): Payer: Medicare Other

## 2018-10-08 ENCOUNTER — Encounter: Payer: Self-pay | Admitting: Neurology

## 2018-10-08 ENCOUNTER — Encounter

## 2018-10-08 VITALS — BP 140/80 | HR 88 | Ht 60.0 in | Wt 154.0 lb

## 2018-10-08 DIAGNOSIS — R29898 Other symptoms and signs involving the musculoskeletal system: Secondary | ICD-10-CM

## 2018-10-08 DIAGNOSIS — R6889 Other general symptoms and signs: Secondary | ICD-10-CM

## 2018-10-08 DIAGNOSIS — R292 Abnormal reflex: Secondary | ICD-10-CM | POA: Diagnosis not present

## 2018-10-08 LAB — VITAMIN B12: Vitamin B-12: 219 pg/mL (ref 211–911)

## 2018-10-08 NOTE — Patient Instructions (Addendum)
MRI cervical spine without  Check labs   Please discuss with your therapist about your fears with walking

## 2018-10-11 ENCOUNTER — Encounter: Payer: Self-pay | Admitting: *Deleted

## 2018-10-11 ENCOUNTER — Telehealth: Payer: Self-pay | Admitting: *Deleted

## 2018-10-11 LAB — TIQ-NTM

## 2018-10-11 LAB — ZINC: Zinc: 64 ug/dL (ref 60–130)

## 2018-10-11 LAB — COPPER, SERUM: COPPER: 79 ug/dL (ref 70–175)

## 2018-10-11 NOTE — Telephone Encounter (Signed)
Results and instructions sent via My Chart.  

## 2018-10-11 NOTE — Telephone Encounter (Signed)
-----   Message from Alda Berthold, DO sent at 10/11/2018  3:19 PM EST ----- Please inform patient that her vitamin b12 is low-normal.  Start OTC vitamin B12 1022mcg daily. Remaining labs are normal. Thanks.

## 2018-11-02 ENCOUNTER — Other Ambulatory Visit: Payer: Self-pay

## 2018-12-20 DIAGNOSIS — F29 Unspecified psychosis not due to a substance or known physiological condition: Secondary | ICD-10-CM | POA: Diagnosis not present

## 2019-01-04 DIAGNOSIS — F3131 Bipolar disorder, current episode depressed, mild: Secondary | ICD-10-CM | POA: Diagnosis not present

## 2019-02-10 ENCOUNTER — Ambulatory Visit (INDEPENDENT_AMBULATORY_CARE_PROVIDER_SITE_OTHER): Payer: Medicare Other | Admitting: Family Medicine

## 2019-02-10 ENCOUNTER — Other Ambulatory Visit: Payer: Self-pay

## 2019-02-10 ENCOUNTER — Encounter: Payer: Self-pay | Admitting: Family Medicine

## 2019-02-10 DIAGNOSIS — N390 Urinary tract infection, site not specified: Secondary | ICD-10-CM

## 2019-02-10 DIAGNOSIS — N3941 Urge incontinence: Secondary | ICD-10-CM

## 2019-02-10 DIAGNOSIS — R300 Dysuria: Secondary | ICD-10-CM

## 2019-02-10 DIAGNOSIS — R319 Hematuria, unspecified: Secondary | ICD-10-CM | POA: Diagnosis not present

## 2019-02-10 MED ORDER — NITROFURANTOIN MONOHYD MACRO 100 MG PO CAPS: 100.0000 mg | ORAL_CAPSULE | Freq: Two times a day (BID) | ORAL | 0 refills | Status: AC

## 2019-02-10 NOTE — Progress Notes (Signed)
Virtual Visit via Video Note   I connected with [name] on 02/10/19 at 12:15 PM EDT by a video enabled telemedicine application and verified that I am speaking with the correct person using two identifiers.  Location patient: home Location provider:home office Persons participating in the virtual visit: patient, provider  I discussed the limitations of evaluation and management by telemedicine and the availability of in person appointments. The patient expressed understanding and agreed to proceed.   HPI: [name] is a 58 yo female with Hx of schizoaffective bipolar disorder,anxiety,and chronic fatigue c/o 2 weeks of intermittent frequent urination,urgency incontinence,and dysuria.  She has not identified exacerbating or alleviating factors.  Denies Hx of urine incontinence,has had this before with UTI's. Last Ucx 2018 grew multiple flora.  Denies fever,chills,unusual fatigue,abdominal pain,N/V,vaginal bleeding or discharge. Also having urine "a little pink." She denies nose/gum bleeding,blood in stool,or melena.  Hx of nephrolithiasis. She has not noted unusual back pain.  Symptoms getting worse.  She has not tried OTC medications.   ROS: See pertinent positives and negatives per HPI.  Past Medical History:  Diagnosis Date  . Anxiety   . Bipolar 1 disorder (HCC)    HX PSYCHOSIS  . Complication of anesthesia    POST AGGITATION  . Frequency of urination   . Headache(784.0)   . History of duodenal ulcer   . Hyperlipidemia   . IBS (irritable bowel syndrome)   . Nocturia   . Pelvic pain   . Personality disorder ([place])    W/ BORDERLINE FEATURES  . Urgency of urination     Past Surgical History:  Procedure Laterality Date  . CARDIAC CATHETERIZATION  10-09-1999  DR KATZ   NORMAL LVF/  NORMAL RCA/  NO CRITICAL DISEASE LEFT CORONARY SYSTEM  . CARDIOVASCULAR STRESS TEST  01-23-2011   NORMAL NUCLEAR STUDY/ NO ISCHEMIA/ EF 81%  . CYSTOSCOPY WITH BIOPSY N/A  06/03/2013   Procedure: CYSTOSCOPY WITH BIOPSY  INSTILLATION OF MARCAINE AND PYRIDIUM;  Surgeon: Hanley Ben, MD;  Location: [place];  Service: Urology;  Laterality: N/A;  . LAPAROSCOPIC CHOLECYSTECTOMY  11-08-2001  . LAPAROSCOPIC LEFT SALPINGOOPHORECTOMY AND LYSYS ADHESIONS  03-10-2002  . LAPAROSCOPIC REMOVAL OVARY REMNANT  2003   [place]  . LAPAROSCOPY N/A 12/14/2012   Procedure: LAPAROSCOPY DIAGNOSTIC;  Surgeon: Stark Klein, MD;  Location: [facility];  Service: General;  Laterality: N/A;  . TOTAL ABDOMINAL HYSTERECTOMY  2000   W/ RIGHT SALPINGOOPHORECTOMY  . TRANSTHORACIC ECHOCARDIOGRAM  10-13-2010   NORMAL LVF/  EF 55-60%    Family History  Problem Relation Age of Onset  . Sleep apnea Father   . Depression Father   . Alcohol abuse Father   . Hypertension Father   . Migraines Sister        headaches  . Anxiety disorder Sister   . Other Mother        MAC infection  . Anxiety disorder Mother   . Dementia Mother   . Heart disease Paternal Grandmother   . Hyperlipidemia Paternal Grandmother   . Alcohol abuse Paternal Grandfather   . Diabetes Neg Hx     Social History   Socioeconomic History  . Marital status: Married    Spouse name: Not on file  . Number of children: 0  . Years of education: college  . Highest education level: Not on file  Occupational History  . 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 Occupation: disability  Social Needs  . Emergency planning/management officer  strain: Not on file  . Food insecurity    Worry: Not on file    Inability: Not on file  . Transportation needs    Medical: Not on file    Non-medical: Not on file  Tobacco Use  . Smoking status: Former Smoker    Packs/day: 0.01    Years: 1.00    Pack years: 0.01    Types: Cigarettes    Quit date: 1980    Years since quitting: 40.5  . Smokeless tobacco: Never Used  . Tobacco comment: ONLY SMOKED FOR 6 MONTHS --  QUIT  YRS AGO  Substance and Sexual Activity  . Alcohol use: Yes     Comment: occ  . Drug use: No  . Sexual activity: Not on file  Lifestyle  . Physical activity    Days per week: Not on file    Minutes per session: Not on file  . Stress: Not on file  Relationships  . Social Herbalist on phone: Not on file    Gets together: Not on file    Attends religious service: Not on file    Active member of club or organization: Not on file    Attends meetings of clubs or organizations: Not on file    Relationship status: Not on file  . Intimate partner violence    Fear of current or ex partner: Not on file    Emotionally abused: Not on file    Physically abused: Not on file    Forced sexual activity: Not on file  Other Topics Concern  . 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    no tobacco   Caffeine Use: very little, three times a week      Current Outpatient Medications:  .  albuterol (PROVENTIL HFA;VENTOLIN HFA) 108 (90 Base) MCG/ACT inhaler, Inhale into the lungs., Disp: , Rfl:  .  budesonide-formoterol (SYMBICORT) 160-4.5 MCG/ACT inhaler, Inhale 2 puffs into the lungs 2 (two) times daily., Disp: 1 Inhaler, Rfl: 3 .  buPROPion (WELLBUTRIN XL) 150 MG 24 hr tablet, TAKE 3 TABLETS IN THE MORNING, Disp: , Rfl: 0 .  Cholecalciferol (VITAMIN D) 50 MCG (2000 UT) tablet, Take by mouth., Disp: , Rfl:  .  gabapentin (NEURONTIN) 300 MG capsule, Take by mouth at bedtime. , Disp: , Rfl:  .  hydrOXYzine (ATARAX/VISTARIL) 25 MG tablet, Take by mouth., Disp: , Rfl:  .  lithium carbonate (LITHOBID) 300 MG CR tablet, Take 2 tablets (600 mg total) by mouth every evening. (Patient taking differently: Take 1 tablet by mouth in the morning and 2 tablets in the evening.), Disp: 60 tablet, Rfl: 0 .  LORazepam (ATIVAN) 0.5 MG tablet, Take 0.5 mg by mouth 2 (two) times daily. 16m at bedtime, Disp: ,  Rfl:  .  nitrofurantoin, macrocrystal-monohydrate, (MACROBID) 100 MG capsule, Take 1 capsule (100 mg total) by mouth 2 (two) times daily for 5 days., Disp: 10 capsule, Rfl: 0  EXAM:  VITALS per patient if applicable:N/A  GENERAL: alert, oriented, appears well and in no acute distress  HEENT: atraumatic,normocephalic, and conjunctiva clear.  LUNGS: on inspection no signs of respiratory distress, breathing rate appears normal, no obvious gross SOB, gasping or wheezing  CV: no obvious cyanosis  PSYCH/NEURO:Cooperative, flat affect, well groomed,good eye contact.  ASSESSMENT AND PLAN:  Discussed the following assessment and plan:  Dysuria - Plan: POCT Urinalysis Dipstick (Automated), Culture, Urine,  Possible causes of reported urinary symptoms discussed. Lab appt for today to collect urine before she starts abx treatment.  Urinary tract infection with hematuria, site unspecified - Plan: Culture, Urine, nitrofurantoin, macrocrystal-monohydrate, (MACROBID) 100 MG capsule. Reporting "pink" urine, ? Gross hematuria. Possible causes of hematuria discussed. Former smoker.   Empiric abx treatment started today and will be tailored according to Ucx results and susceptibility report.  Clearly instructed about warning signs. F/U if symptoms persist.   Urge incontinence of urine Could be related to UTI but also to consider is overactive bladder. Listed on PMHx is urinary frequency. We can consider Oxybutynin,Detrol,or Myrbetriq.     Strongly recommend keeping lab appt for today and collect urine before starting abx.States that she is going to take a nap and then go to the clinic.   I discussed the assessment and treatment plan with the patient. She was provided an opportunity to ask questions and all were answered. The patient agreed with the plan and demonstrated an understanding of the instructions.   The patient was advised to call back or seek an in-person evaluation if the symptoms  worsen or if the condition fails to improve as anticipated.  Return if symptoms worsen or fail to improve.    Davianna Deutschman [name], MD

## 2019-02-17 ENCOUNTER — Ambulatory Visit: Payer: Self-pay

## 2019-02-17 ENCOUNTER — Encounter: Payer: Self-pay | Admitting: Family Medicine

## 2019-02-17 ENCOUNTER — Ambulatory Visit (INDEPENDENT_AMBULATORY_CARE_PROVIDER_SITE_OTHER): Payer: Medicare Other | Admitting: Family Medicine

## 2019-02-17 ENCOUNTER — Other Ambulatory Visit: Payer: Self-pay

## 2019-02-17 DIAGNOSIS — R197 Diarrhea, unspecified: Secondary | ICD-10-CM | POA: Diagnosis not present

## 2019-02-17 NOTE — Progress Notes (Signed)
Virtual Visit via Video Note  I connected with [name]  on 02/17/19 at  4:40 PM EDT by a video enabled telemedicine application and verified that I am speaking with the correct person using two identifiers.  Location patient: home Location provider:work or home office Persons participating in the virtual visit: patient, provider  I discussed the limitations of evaluation and management by telemedicine and the availability of in person appointments. The patient expressed understanding and agreed to proceed.   HPI:  Acute visit for diarrhea.  -worse the last 5-6 days ago, loose stools 4-5 times daily, watery and sometimes mushy -uti 1 week ago and started macrobid - uti symptoms resolved -however, reports has had some looser bowels and diarrhea on and off for for several months, ? Small amount of blood a few times in the stool -denies:fevers, focal/persistent/severe abd pain, malaise, hematochezia, ongoing urinary symptoms, vomiting, fevers, malaise, COVID19 exposures or sick exposures   ROS: See pertinent positives and negatives per HPI.  Past Medical History:  Diagnosis Date  . Anxiety   . Bipolar 1 disorder (HCC)    HX PSYCHOSIS  . Complication of anesthesia    POST AGGITATION  . Frequency of urination   . Headache(784.0)   . History of duodenal ulcer   . Hyperlipidemia   . IBS (irritable bowel syndrome)   . Nocturia   . Pelvic pain   . Personality disorder ([place])    W/ BORDERLINE FEATURES  . Urgency of urination     Past Surgical History:  Procedure Laterality Date  . CARDIAC CATHETERIZATION  10-09-1999  DR KATZ   NORMAL LVF/  NORMAL RCA/  NO CRITICAL DISEASE LEFT CORONARY SYSTEM  . CARDIOVASCULAR STRESS TEST  01-23-2011   NORMAL NUCLEAR STUDY/ NO ISCHEMIA/ EF 81%  . CYSTOSCOPY WITH BIOPSY N/A 06/03/2013   Procedure: CYSTOSCOPY WITH BIOPSY  INSTILLATION OF MARCAINE AND PYRIDIUM;  Surgeon: Hanley Ben, MD;  Location: [facility];  Service: Urology;   Laterality: N/A;  . LAPAROSCOPIC CHOLECYSTECTOMY  11-08-2001  . LAPAROSCOPIC LEFT SALPINGOOPHORECTOMY AND LYSYS ADHESIONS  03-10-2002  . LAPAROSCOPIC REMOVAL OVARY REMNANT  2003   [place]  . LAPAROSCOPY N/A 12/14/2012   Procedure: LAPAROSCOPY DIAGNOSTIC;  Surgeon: Stark Klein, MD;  Location: [facility];  Service: General;  Laterality: N/A;  . TOTAL ABDOMINAL HYSTERECTOMY  2000   W/ RIGHT SALPINGOOPHORECTOMY  . TRANSTHORACIC ECHOCARDIOGRAM  10-13-2010   NORMAL LVF/  EF 55-60%    Family History  Problem Relation Age of Onset  . Sleep apnea Father   . Depression Father   . Alcohol abuse Father   . Hypertension Father   . Migraines Sister        headaches  . Anxiety disorder Sister   . Other Mother        MAC infection  . Anxiety disorder Mother   . Dementia Mother   . Heart disease Paternal Grandmother   . Hyperlipidemia Paternal Grandmother   . Alcohol abuse Paternal Grandfather   . Diabetes Neg Hx     SOCIAL HX: see hpi   Current Outpatient Medications:  .  budesonide-formoterol (SYMBICORT) 160-4.5 MCG/ACT inhaler, Inhale 2 puffs into the lungs 2 (two) times daily., Disp: 1 Inhaler, Rfl: 3 .  buPROPion (WELLBUTRIN XL) 150 MG 24 hr tablet, TAKE 3 TABLETS IN THE MORNING, Disp: , Rfl: 0 .  Cholecalciferol (VITAMIN D) 50 MCG (2000 UT) tablet, Take by mouth., Disp: , Rfl:  .  gabapentin (NEURONTIN) 300 MG capsule,  Take by mouth at bedtime. , Disp: , Rfl:  .  hydrOXYzine (ATARAX/VISTARIL) 25 MG tablet, Take by mouth., Disp: , Rfl:  .  lithium carbonate (LITHOBID) 300 MG CR tablet, Take 2 tablets (600 mg total) by mouth every evening. (Patient taking differently: Take 1 tablet by mouth in the morning and 2 tablets in the evening.), Disp: 60 tablet, Rfl: 0 .  LORazepam (ATIVAN) 0.5 MG tablet, Take 0.5 mg by mouth 2 (two) times daily. 12m at bedtime, Disp: , Rfl:  .  albuterol (PROVENTIL HFA;VENTOLIN HFA) 108 (90 Base) MCG/ACT inhaler, Inhale into the lungs., Disp: , Rfl:    EXAM:  VITALS per patient if applicable:denies fevers  GENERAL: alert, oriented, appears well and in no acute distress  HEENT: atraumatic, conjunttiva clear, no obvious abnormalities on inspection of external nose and ears  NECK: normal movements of the head and neck  LUNGS: on inspection no signs of respiratory distress, breathing rate appears normal, no obvious gross SOB, gasping or wheezing  CV: no obvious cyanosis  ABD: soft, NTTP on pt self exam  MS: moves all visible extremities without noticeable abnormality  PSYCH/NEURO: pleasant and cooperative, no obvious depression or anxiety, speech and thought processing grossly intact  ASSESSMENT AND PLAN:  Discussed the following assessment and plan:  Diarrhea, unspecified type - Plan: Clostridium difficile Toxin B, stool culture Discussed potential etiologies, options for evaluation, treatment options, risks nad precuations. Given symptoms for some time, worsening after abx opted to check fo C. Diff and stool culture. Trial of no dairy and imodium. Follow up next week - advised may need referral to GI for more extensive eval if symptoms persist and these are neg. Advised if any worsening or severe symptoms to seek prompt medical care.     I discussed the assessment and treatment plan with the patient. The patient was provided an opportunity to ask questions and all were answered. The patient agreed with the plan and demonstrated an understanding of the instructions.   The patient was advised to call back or seek an in-person evaluation if the symptoms worsen or if the condition fails to improve as anticipated.   Follow up instructions: Advised assistant JWendie Simmerto help patient arrange the following: -labs next week -follow up with PCP in 1 week  HLucretia Kern DO

## 2019-02-17 NOTE — Telephone Encounter (Signed)
Pt scheduled with provider.

## 2019-02-17 NOTE — Telephone Encounter (Signed)
Returned call to patient who states that she has had abdominal pain and cramping for about 2 weeks.  She states that she has diarrhea about 4-6 stools per day that are orange and loose. She has just finished antibiotic  treatment for a UTI . She started Macrobid on 6/25. She states that she does not have much appetite and no fever. She states she feels ache all over. Care advice read to patient. Patient verbalized understanding of instructions. Call placed to office for scheduling.  Reason for Disposition . [1] MILD-MODERATE pain AND [2] constant AND [3] present > 2 hours . [1] MODERATE diarrhea (e.g., 4-6 times / day more than normal) AND [2] present > 48 hours (2 days)  Answer Assessment - Initial Assessment Questions 1. LOCATION: "Where does it hurt?"      Lower and upper cramping 2. RADIATION: "Does the pain shoot anywhere else?" (e.g., chest, back)     no 3. ONSET: "When did the pain begin?" (e.g., minutes, hours or days ago)      2 weeks 4. SUDDEN: "Gradual or sudden onset?"     sudden 5. PATTERN "Does the pain come and go, or is it constant?"    - If constant: "Is it getting better, staying the same, or worsening?"      (Note: Constant means the pain never goes away completely; most serious pain is constant and it progresses)     - If intermittent: "How long does it last?" "Do you have pain now?"     (Note: Intermittent means the pain goes away completely between bouts)   Come and go 6. SEVERITY: "How bad is the pain?"  (e.g., Scale 1-10; mild, moderate, or severe)   - MILD (1-3): doesn't interfere with normal activities, abdomen soft and not tender to touch    - MODERATE (4-7): interferes with normal activities or awakens from sleep, tender to touch    - SEVERE (8-10): excruciating pain, doubled over, unable to do any normal activities     4-7 7. RECURRENT SYMPTOM: "Have you ever had this type of abdominal pain before?" If so, ask: "When was the last time?" and "What happened that  time?"     inflamation of colon 8. CAUSE: "What do you think is causing the abdominal pain?"     IBS 9. RELIEVING/AGGRAVATING FACTORS: "What makes it better or worse?" (e.g., movement, antacids, bowel movement)     bm makes cramping go away 10. OTHER SYMPTOMS: "Has there been any vomiting, diarrhea, constipation, or urine problems?"       Diarrhea loose orange 11. PREGNANCY: "Is there any chance you are pregnant?" "When was your last menstrual period?"       N/A  Answer Assessment - Initial Assessment Questions 1. DIARRHEA SEVERITY: "How bad is the diarrhea?" "How many extra stools have you had in the past 24 hours than normal?"    - NO DIARRHEA (SCALE 0)   - MILD (SCALE 1-3): Few loose or mushy BMs; increase of 1-3 stools over normal daily number of stools; mild increase in ostomy output.   -  MODERATE (SCALE 4-7): Increase of 4-6 stools daily over normal; moderate increase in ostomy output. * SEVERE (SCALE 8-10; OR 'WORST POSSIBLE'): Increase of 7 or more stools daily over normal; moderate increase in ostomy output; incontinence.     4-5 daily 2. ONSET: "When did the diarrhea begin?"      2 weeks ago 3. BM CONSISTENCY: "How loose or watery is the diarrhea?"  loose 4. VOMITING: "Are you also vomiting?" If so, ask: "How many times in the past 24 hours?"      no 5. ABDOMINAL PAIN: "Are you having any abdominal pain?" If yes: "What does it feel like?" (e.g., crampy, dull, intermittent, constant)      cramping 6. ABDOMINAL PAIN SEVERITY: If present, ask: "How bad is the pain?"  (e.g., Scale 1-10; mild, moderate, or severe)   - MILD (1-3): doesn't interfere with normal activities, abdomen soft and not tender to touch    - MODERATE (4-7): interferes with normal activities or awakens from sleep, tender to touch    - SEVERE (8-10): excruciating pain, doubled over, unable to do any normal activities       4-7 7. ORAL INTAKE: If vomiting, "Have you been able to drink liquids?" "How much  fluids have you had in the past 24 hours?"     trying 8. HYDRATION: "Any signs of dehydration?" (e.g., dry mouth [not just dry lips], too weak to stand, dizziness, new weight loss) "When did you last urinate?"    no 9. EXPOSURE: "Have you traveled to a foreign country recently?" "Have you been exposed to anyone with diarrhea?" "Could you have eaten any food that was spoiled?"    no 10. ANTIBIOTIC USE: "Are you taking antibiotics now or have you taken antibiotics in the past 2 months?"     Macrobid 02/10/2019 11. OTHER SYMPTOMS: "Do you have any other symptoms?" (e.g., fever, blood in stool)       Orange colored stool 12. PREGNANCY: "Is there any chance you are pregnant?" "When was your last menstrual period?"       N/A  Protocols used: ABDOMINAL PAIN - FEMALE-A-AH, DIARRHEA-A-AH

## 2019-02-21 ENCOUNTER — Telehealth: Payer: Self-pay | Admitting: *Deleted

## 2019-02-21 NOTE — Telephone Encounter (Signed)
-----   Message from Lucretia Kern, DO sent at 02/17/2019  8:40 PM EDT ----- -labs next week-follow up with PCP in 1 week

## 2019-02-21 NOTE — Telephone Encounter (Signed)
I left a detailed message at the pts cell number to call the office for a virtual visit with Dr [name].

## 2019-02-22 ENCOUNTER — Other Ambulatory Visit: Payer: Self-pay

## 2019-02-22 ENCOUNTER — Ambulatory Visit (INDEPENDENT_AMBULATORY_CARE_PROVIDER_SITE_OTHER): Payer: Medicare Other | Admitting: Family Medicine

## 2019-02-22 ENCOUNTER — Encounter: Payer: Self-pay | Admitting: Family Medicine

## 2019-02-22 DIAGNOSIS — R109 Unspecified abdominal pain: Secondary | ICD-10-CM

## 2019-02-22 DIAGNOSIS — K580 Irritable bowel syndrome with diarrhea: Secondary | ICD-10-CM | POA: Diagnosis not present

## 2019-02-22 DIAGNOSIS — M791 Myalgia, unspecified site: Secondary | ICD-10-CM | POA: Diagnosis not present

## 2019-02-22 NOTE — Progress Notes (Signed)
Virtual Visit via Video Note   I connected with [name] Yeomans on 02/22/19 at  2:00 PM EDT by a video enabled telemedicine application and verified that I am speaking with the correct person using two identifiers.  Location patient: home Location provider:home office Persons participating in the virtual visit: patient, provider  I discussed the limitations of evaluation and management by telemedicine and the availability of in person appointments. The patient expressed understanding and agreed to proceed.   HPI:  [name] is a 58 yo female with Hx of bipolar disorder and anxiety who is following on recent visit. She had a virtual visit on 02/17/19,when she was c/o 5-6 days of severe abdominal pain and diarrhea. Abdominal pain has resolved and diarrhea has improved. Last bowel movement today am,formed.  Hx of IBS,she has intermittent episodes of abdominal cramps and diarrhea,back to her baseline. She has not identified exacerbating or alleviating factors. Colonoscopy 09/2012, 10 years f/u was recommended.  Denies fever,chills,or unusual fatigue. Negative for blood in stool,nausea,vomiting,or melena.  Treated for UTI on 02/10/19 with macrobid. Negative for urinary symptoms.  C/O body aches for " a while", back,arms,and legs. No joint edema or erythema. She has not identified exacerbating or alleviating factors. Stable. + Chronic fatigue.   Lab Results  Component Value Date   ESRSEDRATE 1 10/13/2010   Lab Results  Component Value Date   CRP <0.5 11/07/2013    She has not tried OTC medications. She takes Gabapentin for insomnia.   ROS: See pertinent positives and negatives per HPI.  Past Medical History:  Diagnosis Date  . Anxiety   . Bipolar 1 disorder (HCC)    HX PSYCHOSIS  . Complication of anesthesia    POST AGGITATION  . Frequency of urination   . Headache(784.0)   . History of duodenal ulcer   . Hyperlipidemia   . IBS (irritable bowel syndrome)   .  Nocturia   . Pelvic pain   . Personality disorder ([place])    W/ BORDERLINE FEATURES  . Urgency of urination     Past Surgical History:  Procedure Laterality Date  . CARDIAC CATHETERIZATION  10-09-1999  DR KATZ   NORMAL LVF/  NORMAL RCA/  NO CRITICAL DISEASE LEFT CORONARY SYSTEM  . CARDIOVASCULAR STRESS TEST  01-23-2011   NORMAL NUCLEAR STUDY/ NO ISCHEMIA/ EF 81%  . CYSTOSCOPY WITH BIOPSY N/A 06/03/2013   Procedure: CYSTOSCOPY WITH BIOPSY  INSTILLATION OF MARCAINE AND PYRIDIUM;  Surgeon: Hanley Ben, MD;  Location: [facility];  Service: Urology;  Laterality: N/A;  . LAPAROSCOPIC CHOLECYSTECTOMY  11-08-2001  . LAPAROSCOPIC LEFT SALPINGOOPHORECTOMY AND LYSYS ADHESIONS  03-10-2002  . LAPAROSCOPIC REMOVAL OVARY REMNANT  2003   [place]  . LAPAROSCOPY N/A 12/14/2012   Procedure: LAPAROSCOPY DIAGNOSTIC;  Surgeon: Stark Klein, MD;  Location: [facility];  Service: General;  Laterality: N/A;  . TOTAL ABDOMINAL HYSTERECTOMY  2000   W/ RIGHT SALPINGOOPHORECTOMY  . TRANSTHORACIC ECHOCARDIOGRAM  10-13-2010   NORMAL LVF/  EF 55-60%    Family History  Problem Relation Age of Onset  . Sleep apnea Father   . Depression Father   . Alcohol abuse Father   . Hypertension Father   . Migraines Sister        headaches  . Anxiety disorder Sister   . Other Mother        MAC infection  . Anxiety disorder Mother   . Dementia Mother   . Heart disease Paternal Grandmother   . Hyperlipidemia Paternal  Grandmother   . Alcohol abuse Paternal Grandfather   . Diabetes Neg Hx     Social History   Socioeconomic History  . Marital status: Married    Spouse name: Not on file  . Number of children: 0  . Years of education: college  . Highest education level: Not on file  Occupational History  . 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 Occupation: disability  Social Needs  . Financial resource strain: Not on file  . Food insecurity    Worry: Not on file    Inability: Not on file  .  Transportation needs    Medical: Not on file    Non-medical: Not on file  Tobacco Use  . Smoking status: Former Smoker    Packs/day: 0.01    Years: 1.00    Pack years: 0.01    Types: Cigarettes    Quit date: 1980    Years since quitting: 40.5  . Smokeless tobacco: Never Used  . Tobacco comment: ONLY SMOKED FOR 6 MONTHS --  QUIT  YRS AGO  Substance and Sexual Activity  . Alcohol use: Yes    Comment: occ  . Drug use: No  . Sexual activity: Not on file  Lifestyle  . Physical activity    Days per week: Not on file    Minutes per session: Not on file  . Stress: Not on file  Relationships  . Social Herbalist on phone: Not on file    Gets together: Not on file    Attends religious service: Not on file    Active member of club or organization: Not on file    Attends meetings of clubs or organizations: Not on file    Relationship status: Not on file  . Intimate partner violence    Fear of current or ex partner: Not on file    Emotionally abused: Not on file    Physically abused: Not on file    Forced sexual activity: Not on file  Other Topics Concern  . 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    no tobacco   Caffeine Use: very little, three times a week     Current Outpatient Medications:  .  albuterol (PROVENTIL HFA;VENTOLIN HFA) 108 (90 Base) MCG/ACT inhaler, Inhale into the lungs., Disp: , Rfl:  .  budesonide-formoterol (SYMBICORT) 160-4.5 MCG/ACT inhaler, Inhale 2 puffs into the lungs 2 (two) times daily., Disp: 1 Inhaler, Rfl: 3 .  buPROPion (WELLBUTRIN XL) 150 MG 24 hr tablet, TAKE 3 TABLETS IN THE MORNING, Disp: , Rfl: 0 .  Cholecalciferol (VITAMIN D) 50 MCG (2000 UT) tablet, Take by mouth., Disp: , Rfl:  .  gabapentin (NEURONTIN) 300 MG capsule, Take by mouth at bedtime. , Disp: , Rfl:  .   hydrOXYzine (ATARAX/VISTARIL) 25 MG tablet, Take by mouth., Disp: , Rfl:  .  lithium carbonate (LITHOBID) 300 MG CR tablet, Take 2 tablets (600 mg total) by mouth every evening. (Patient taking differently: Take 1 tablet by mouth in the morning and 2 tablets in the evening.), Disp: 60 tablet, Rfl: 0 .  LORazepam (ATIVAN) 0.5 MG tablet, Take 0.5 mg by mouth 2 (two) times daily. 25m at bedtime, Disp: , Rfl:   EXAM:  VITALS per patient if applicable:N/A  GENERAL: alert,  oriented, appears well and in no acute distress  HEENT: atraumatic, conjunctiva clear, no obvious facial abnormalities on inspection.  NECK: normal movements of the head and neck  LUNGS: on inspection no signs of respiratory distress, breathing rate appears normal, no obvious gross SOB, gasping or wheezing  CV: no obvious cyanosis  [name]: moves all visible extremities without noticeable abnormality. No signs of synovitis. No pain elicited when she presses areas on chest wall or upper extremities muscles.  PSYCH/NEURO: pleasant and cooperative, no obvious depression or anxiety,in bed.  ASSESSMENT AND PLAN:  Discussed the following assessment and plan:  Abdominal pain, unspecified abdominal location - Plan: Resolved. Instructed about warning signs. F/U as needed.  Irritable bowel syndrome with diarrhea - Plan: Intermittent diarrhea,acute episodes resolved and now back to her baseline. I do not think stool Cx or C diff tests are needed at this time. Adequate hydration.  Myalgia - Plan: ? Fibromyalgia. Because this is a chronic problem,I do not think work up is needed at this time. Educated about signs and symptoms of fibromyalgia and treatment options. Low impact regular exercise may help. She is on Gabapentin already.  We will follow on this next OV,before if needed.     I discussed the assessment and treatment plan with the patient. She was provided an opportunity to ask questions and all were answered. She  agreed with the plan and demonstrated an understanding of the instructions.   The patient was advised to call back or seek an in-person evaluation if the symptoms worsen or if the condition fails to improve as anticipated.  Return if symptoms worsen or fail to improve.    Betty [name], MD

## 2019-03-23 DIAGNOSIS — F3131 Bipolar disorder, current episode depressed, mild: Secondary | ICD-10-CM | POA: Diagnosis not present

## 2019-04-11 ENCOUNTER — Telehealth (INDEPENDENT_AMBULATORY_CARE_PROVIDER_SITE_OTHER): Payer: Medicare Other | Admitting: Family Medicine

## 2019-04-11 ENCOUNTER — Encounter: Payer: Self-pay | Admitting: Family Medicine

## 2019-04-11 ENCOUNTER — Other Ambulatory Visit: Payer: Self-pay

## 2019-04-11 DIAGNOSIS — J309 Allergic rhinitis, unspecified: Secondary | ICD-10-CM

## 2019-04-11 DIAGNOSIS — J069 Acute upper respiratory infection, unspecified: Secondary | ICD-10-CM | POA: Diagnosis not present

## 2019-04-11 NOTE — Progress Notes (Signed)
Virtual Visit via Video Note   I connected with [name] on 04/11/19 by a video enabled telemedicine application and verified that I am speaking with the correct person using two identifiers.  Location patient: home Location provider:work office Persons participating in the virtual visit: patient, provider  I discussed the limitations of evaluation and management by telemedicine and the availability of in person appointments. The patient expressed understanding and agreed to proceed.   HPI: [name]. [name] is a 58 yo female with Hx of allergic rhinitis,bipolar disorder, chronic fatigue and recurrent bronchitis who is complaining of 3 to 4 days of "pain all over", "little" nonproductive cough, nasal congestion, rhinorrhea, and "little" sore throat.  Symptoms are worse in the morning.  "Little" chills. Denies anosmia or ageusia. "Sometimes" wheezing. Hx of intermittent wheezing and cough,follows with pulmonologist. Currently she is on albuterol inhaler as needed, she has not used it.  She is also on Symbicort 160-4.5 mcg 2 puff twice daily.  She denies fever, skin rash, headache, earaches, sinus pain, dyspnea, abdominal pain, vomiting, or urinary symptoms. She has had a "little" diarrhea, which she has had before.She does not think it is related with acute symptoms.  Negative for sick contacts or recent travel. She has not taking OTC medication.  Also wants to know if she can have her flu shot.  ROS: See pertinent positives and negatives per HPI.  Past Medical History:  Diagnosis Date  . Anxiety   . Bipolar 1 disorder (HCC)    HX PSYCHOSIS  . Complication of anesthesia    POST AGGITATION  . Frequency of urination   . Headache(784.0)   . History of duodenal ulcer   . Hyperlipidemia   . IBS (irritable bowel syndrome)   . Nocturia   . Pelvic pain   . Personality disorder ([place])    W/ BORDERLINE FEATURES  . Urgency of urination     Past Surgical History:  Procedure  Laterality Date  . CARDIAC CATHETERIZATION  10-09-1999  DR KATZ   NORMAL LVF/  NORMAL RCA/  NO CRITICAL DISEASE LEFT CORONARY SYSTEM  . CARDIOVASCULAR STRESS TEST  01-23-2011   NORMAL NUCLEAR STUDY/ NO ISCHEMIA/ EF 81%  . CYSTOSCOPY WITH BIOPSY N/A 06/03/2013   Procedure: CYSTOSCOPY WITH BIOPSY  INSTILLATION OF MARCAINE AND PYRIDIUM;  Surgeon: Hanley Ben, MD;  Location: [facility];  Service: Urology;  Laterality: N/A;  . LAPAROSCOPIC CHOLECYSTECTOMY  11-08-2001  . LAPAROSCOPIC LEFT SALPINGOOPHORECTOMY AND LYSYS ADHESIONS  03-10-2002  . LAPAROSCOPIC REMOVAL OVARY REMNANT  2003   [facility]  . LAPAROSCOPY N/A 12/14/2012   Procedure: LAPAROSCOPY DIAGNOSTIC;  Surgeon: Stark Klein, MD;  Location: [facility];  Service: General;  Laterality: N/A;  . TOTAL ABDOMINAL HYSTERECTOMY  2000   W/ RIGHT SALPINGOOPHORECTOMY  . TRANSTHORACIC ECHOCARDIOGRAM  10-13-2010   NORMAL LVF/  EF 55-60%    Family History  Problem Relation Age of Onset  . Sleep apnea Father   . Depression Father   . Alcohol abuse Father   . Hypertension Father   . Migraines Sister        headaches  . Anxiety disorder Sister   . Other Mother        MAC infection  . Anxiety disorder Mother   . Dementia Mother   . Heart disease Paternal Grandmother   . Hyperlipidemia Paternal Grandmother   . Alcohol abuse Paternal Grandfather   . Diabetes Neg Hx     Social History   Socioeconomic History  .  Marital status: Married    Spouse name: Not on file  . Number of children: 0  . Years of education: college  . Highest education level: Not on file  Occupational History  . 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 Occupation: disability  Social Needs  . Financial resource strain: Not on file  . Food insecurity    Worry: Not on file    Inability: Not on file  . Transportation needs    Medical: Not on file    Non-medical: Not on file  Tobacco Use  . Smoking status: Former Smoker    Packs/day: 0.01    Years:  1.00    Pack years: 0.01    Types: Cigarettes    Quit date: 1980    Years since quitting: 40.6  . Smokeless tobacco: Never Used  . Tobacco comment: ONLY SMOKED FOR 6 MONTHS --  QUIT  YRS AGO  Substance and Sexual Activity  . Alcohol use: Yes    Comment: occ  . Drug use: No  . Sexual activity: Not on file  Lifestyle  . Physical activity    Days per week: Not on file    Minutes per session: Not on file  . Stress: Not on file  Relationships  . Social Herbalist on phone: Not on file    Gets together: Not on file    Attends religious service: Not on file    Active member of club or organization: Not on file    Attends meetings of clubs or organizations: Not on file    Relationship status: Not on file  . Intimate partner violence    Fear of current or ex partner: Not on file    Emotionally abused: Not on file    Physically abused: Not on file    Forced sexual activity: Not on file  Other Topics Concern  . 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    no tobacco   Caffeine Use: very little, three times a week    Current Outpatient Medications:  .  albuterol (PROVENTIL HFA;VENTOLIN HFA) 108 (90 Base) MCG/ACT inhaler, Inhale into the lungs., Disp: , Rfl:  .  budesonide-formoterol (SYMBICORT) 160-4.5 MCG/ACT inhaler, Inhale 2 puffs into the lungs 2 (two) times daily., Disp: 1 Inhaler, Rfl: 3 .  buPROPion (WELLBUTRIN XL) 150 MG 24 hr tablet, TAKE 3 TABLETS IN THE MORNING, Disp: , Rfl: 0 .  Cholecalciferol (VITAMIN D) 50 MCG (2000 UT) tablet, Take by mouth., Disp: , Rfl:  .  gabapentin (NEURONTIN) 300 MG capsule, Take by mouth at bedtime. , Disp: , Rfl:  .  hydrOXYzine (ATARAX/VISTARIL) 25 MG tablet, Take by mouth., Disp: , Rfl:  .  lithium carbonate (LITHOBID) 300 MG CR tablet, Take 2 tablets (600 mg total) by  mouth every evening. (Patient taking differently: Take 1 tablet by mouth in the morning and 2 tablets in the evening.), Disp: 60 tablet, Rfl: 0 .  LORazepam (ATIVAN) 0.5 MG tablet, Take 0.5 mg by mouth 2 (two) times daily. 89m at bedtime, Disp: , Rfl:   EXAM:  VITALS per patient if applicable: She does not have a BP monitor.  GENERAL: alert, oriented, appears well and in no acute distress  HEENT: atraumatic, conjunctiva clear, no obvious abnormalities on inspection. Mild nasal  voice.  NECK: normal movements of the head and neck  LUNGS: on inspection no signs of respiratory distress, breathing rate appears normal, no obvious gross SOB, gasping or wheezing  CV: no obvious cyanosis  [name]: moves all visible extremities without noticeable abnormality  PSYCH/NEURO: pleasant and cooperative, no obvious depression or anxiety.  ASSESSMENT AND PLAN:  Discussed the following assessment and plan:  URI, acute  Allergic rhinitis, unspecified seasonality, unspecified trigger  We discussed possible etiologies.  Symptomatic treatment recommended for now. Any of the above diagnosis could be contributing to some of the symptoms. History and inspection during visit do not suggest a serious process, so I do not think chest imaging is needed. Recommend monitoring temperature daily.  Explained that we could arrange COVID-19 testing but symptoms do not highly suggest it.  She prefers to hold on further testing, states she does not think it is covid infection because symptoms are "not bad." Social isolation still recommended, quarantine for 10 more days at home (14 days from symptoms onset).  She is currently on hydroxyzine 25 mg, which can also help with respiratory symptoms. Adequate hydration and rest.  She can get her influenza vaccine mid 04/2019 if asymptomatic.    She voices understanding.  I discussed the assessment and treatment plan with the patient.  She was provided an opportunity to ask  questions and all were answered.  She agreed with the plan and demonstrated an understanding of the instructions.   The patient was advised to call back or seek an in-person evaluation if the symptoms worsen or if the condition fails to improve as anticipated.  Follow-up as needed.    Makia Bossi [name], MD

## 2019-04-12 ENCOUNTER — Other Ambulatory Visit: Payer: Self-pay

## 2019-04-12 DIAGNOSIS — Z20822 Contact with and (suspected) exposure to covid-19: Secondary | ICD-10-CM

## 2019-04-13 LAB — NOVEL CORONAVIRUS, NAA: SARS-CoV-2, NAA: NOT DETECTED

## 2019-04-21 DIAGNOSIS — F3131 Bipolar disorder, current episode depressed, mild: Secondary | ICD-10-CM | POA: Diagnosis not present

## 2019-05-12 DIAGNOSIS — F3131 Bipolar disorder, current episode depressed, mild: Secondary | ICD-10-CM | POA: Diagnosis not present

## 2019-05-14 ENCOUNTER — Ambulatory Visit: Payer: Medicare Other

## 2019-05-19 DIAGNOSIS — Z23 Encounter for immunization: Secondary | ICD-10-CM | POA: Diagnosis not present

## 2019-06-02 DIAGNOSIS — F3131 Bipolar disorder, current episode depressed, mild: Secondary | ICD-10-CM | POA: Diagnosis not present

## 2019-06-03 ENCOUNTER — Telehealth: Payer: Medicare Other | Admitting: Nurse Practitioner

## 2019-06-03 DIAGNOSIS — Z20828 Contact with and (suspected) exposure to other viral communicable diseases: Secondary | ICD-10-CM

## 2019-06-03 DIAGNOSIS — R059 Cough, unspecified: Secondary | ICD-10-CM

## 2019-06-03 DIAGNOSIS — Z20822 Contact with and (suspected) exposure to covid-19: Secondary | ICD-10-CM

## 2019-06-03 DIAGNOSIS — R05 Cough: Secondary | ICD-10-CM

## 2019-06-03 DIAGNOSIS — R509 Fever, unspecified: Secondary | ICD-10-CM

## 2019-06-03 MED ORDER — BENZONATATE 100 MG PO CAPS: 100.0000 mg | ORAL_CAPSULE | Freq: Three times a day (TID) | ORAL | 0 refills | Status: DC | PRN

## 2019-06-03 NOTE — Progress Notes (Signed)
E-Visit for Corona Virus Screening   Your current symptoms could be consistent with the coronavirus.  Many health care providers can now test patients at their office but not all are.  [facility] has multiple testing sites. For information on our COVID testing locations and hours go to [email or web address]  Please quarantine yourself while awaiting your test results.  We are enrolling you in our [place] for [place] . Daily you will receive a questionnaire within the [facility] website. Our COVID 19 response team willl be monitoriing your responses daily.  You can go to one of the  testing sites listed below, while they are opened (see hours). You do not need a doctors order to be tested for covid.You do need to self-isolate until your results return and if positive 14 days from when your symptoms started and until you are 3 days symptom free.   Testing Locations (Monday - Friday, 8 a.m. - 3:30 p.m.)   [place]: [facility]), [address], [place], [place]: [address], [place], [place], [place] (entrance off [address] (Closed each Monday): [address] [place], [place] - the short stay covered drive at [facility] (Use the [address] entrance to [facility] next to [place] is a respiratory illness with symptoms that are similar to the flu. Symptoms are typically mild to moderate, but there have been cases of severe illness and death due to the virus. The following symptoms may appear 2-14 days after exposure: . Fever . Cough . Shortness of breath or difficulty breathing . Chills . Repeated shaking with chills . Muscle pain . Headache . Sore throat . New loss of taste or smell . Fatigue . Congestion or runny nose . Nausea or vomiting . Diarrhea  It is vitally  important that if you feel that you have an infection such as this virus or any other virus that you stay home and away from places where you may spread it to others.  You should self-quarantine for 14 days if you have symptoms that could potentially be coronavirus or have been in close contact a with a person diagnosed with COVID-19 within the last 2 weeks. You should avoid contact with people age 57 and older.   You should wear a mask or cloth face covering over your nose and mouth if you must be around other people or animals, including pets (even at home). Try to stay at least 6 feet away from other people. This will protect the people around you.  You can use medication such as A prescription cough medication called Tessalon Perles 100 mg. You may take 1-2 capsules every 8 hours as needed for cough  You may also take acetaminophen (Tylenol) as needed for fever.   Reduce your risk of any infection by using the same precautions used for avoiding the common cold or flu:  [name] Kitchen Wash your hands often with soap and warm water for at least 20 seconds.  If soap and water are not readily available, use an alcohol-based hand sanitizer with at least 60% alcohol.  . If coughing or sneezing, cover your mouth and nose by coughing or sneezing into the elbow areas of your shirt or coat, into a tissue or into your sleeve (not your hands). . Avoid shaking hands with others and consider head nods or  verbal greetings only. . Avoid touching your eyes, nose, or mouth with unwashed hands.  . Avoid close contact with people who are sick. . Avoid places or events with large numbers of people in one location, like concerts or sporting events. . Carefully consider travel plans you have or are making. . If you are planning any travel outside or inside the [place], visit the CDC's Travelers' Health webpage for the latest health notices. . If you have some symptoms but not all symptoms, continue to monitor at home and seek medical  attention if your symptoms worsen. . If you are having a medical emergency, call 911.  HOME CARE . Only take medications as instructed by your medical team. . Drink plenty of fluids and get plenty of rest. . A steam or ultrasonic humidifier can help if you have congestion.   GET HELP RIGHT AWAY IF YOU HAVE EMERGENCY WARNING SIGNS** FOR COVID-19. If you or someone is showing any of these signs seek emergency medical care immediately. Call 911 or proceed to your closest emergency facility if: . You develop worsening high fever. . Trouble breathing . Bluish lips or face . Persistent pain or pressure in the chest . New confusion . Inability to wake or stay awake . You cough up blood. . Your symptoms become more severe  **This list is not all possible symptoms. Contact your medical provider for any symptoms that are sever or concerning to you.   MAKE SURE YOU   Understand these instructions.  Will watch your condition.  Will get help right away if you are not doing well or get worse.  Your e-visit answers were reviewed by a board certified advanced clinical practitioner to complete your personal care plan.  Depending on the condition, your plan could have included both over the counter or prescription medications.  If there is a problem please reply once you have received a response from your provider.  Your safety is important to [place].  If you have drug allergies check your prescription carefully.    You can use MyChart to ask questions about today's visit, request a non-urgent call back, or ask for a work or school excuse for 24 hours related to this e-Visit. If it has been greater than 24 hours you will need to follow up with your provider, or enter a new e-Visit to address those concerns. You will get an e-mail in the next two days asking about your experience.  I hope that your e-visit has been valuable and will speed your recovery. Thank you for using e-visits.   5-10 minutes  spent reviewing and documenting in chart.

## 2019-06-04 ENCOUNTER — Encounter (INDEPENDENT_AMBULATORY_CARE_PROVIDER_SITE_OTHER): Payer: Self-pay

## 2019-06-04 ENCOUNTER — Telehealth: Payer: Self-pay

## 2019-06-04 NOTE — Telephone Encounter (Signed)
[place] call -   Patient reports:  Weakness has gotten worse - feeling tired, sleepy and having no enery  Diarrhea - 3-4 times on Friday, 06/03/19;has tried OTC Imodium with no relief.  Appetite - same  Patient denies Fever(98.2), SOB, cough and vomiting.  Reviewed MyChart Companion home monitoring protocol for diarrhea and weakness with patient; gave web site information to research nearest [place] testing sites on Saturday, no further questions.

## 2019-06-04 NOTE — Telephone Encounter (Signed)
Per MyChart questionnaire, pt indicated that her appetite has worsened. Pt denies vomiting or abdominal pain. Advised pt to drink at least 6-8 8 ounce glasses of fluids. Pt denies dry mouth and urinated just before call. Advised pt to work up to bland foods. Advised pt if she is not able to eat or drink and/or having dizziness, weakness, dry mouth, decreased UOP or yellow urine to go to [facility] or ED for evaluation. Pt verbalized understanding.

## 2019-06-14 ENCOUNTER — Telehealth: Payer: Self-pay | Admitting: *Deleted

## 2019-06-14 ENCOUNTER — Encounter (INDEPENDENT_AMBULATORY_CARE_PROVIDER_SITE_OTHER): Payer: Self-pay

## 2019-06-14 NOTE — Telephone Encounter (Signed)
BPA for worsening diarrhea received from MyChart COVID-19 symptom monitoring questionnaire. Pt states that she has had diarrhea 5 times today. Pt states she had to take some imodium to keep the food down and it seemed to help. Pt voiced concern of blood in stool and states she noticed that her stool was a pinkish/orange color in the toilet. Pt states with wiping she noticed the pinkish/orange color as well. Pt states she has also been drinking red colored gatorade.  Pt states she has also experienced cramping type of stomach pain and has to go right away when the urge for a bowel movement occurs. Pt states she has experienced diarrhea for the past couple of das and had an episode of bowel incontinence last night. Pt states she still has a cough sometimes and had a evist for cough on 06/07/19. Pt reports that she has experienced some weakness from the diarrhea. Pt states she has not had covid testing recently. Last COVID test noted to be on 04/12/19. Pt advised to avoid drink red colored gatorade for the next couple of days so that it could be determined if the color of the stool was caused from the gatorade. Pt advised that she could also try Kaopectate or Pepto Bismol for current symptoms. Pt notified that PCP would be informed of current symptoms for recommendations. Pt can be contacted at [phone].

## 2019-06-15 ENCOUNTER — Telehealth: Payer: Medicare Other | Admitting: Nurse Practitioner

## 2019-06-15 DIAGNOSIS — R112 Nausea with vomiting, unspecified: Secondary | ICD-10-CM

## 2019-06-15 MED ORDER — ONDANSETRON HCL 4 MG PO TABS: 4.0000 mg | ORAL_TABLET | Freq: Three times a day (TID) | ORAL | 0 refills | Status: DC | PRN

## 2019-06-15 NOTE — Progress Notes (Signed)
We are sorry that you are not feeling well. Here is how we plan to help!  Based on what you have shared with me it looks like you have a Virus that is irritating your GI tract.  Vomiting is the forceful emptying of a portion of the stomach's content through the mouth.  Although nausea and vomiting can make you feel miserable, it's important to remember that these are not diseases, but rather symptoms of an underlying illness.  When we treat short term symptoms, we always caution that any symptoms that persist should be fully evaluated in a medical office.  I have prescribed a medication that will help alleviate your symptoms and allow you to stay hydrated:  Zofran 4 mg 1 tablet every 8 hours as needed for nausea and vomiting. I cannot rx anything for migraine in e visit, but you can try excedrin migraine or tylenol over the counter.  HOME CARE:  Drink clear liquids.  This is very important! Dehydration (the lack of fluid) can lead to a serious complication.  Start off with 1 tablespoon every 5 minutes for 8 hours.  You may begin eating bland foods after 8 hours without vomiting.  Start with saltine crackers, white bread, rice, mashed potatoes, applesauce.  After 48 hours on a bland diet, you may resume a normal diet.  Try to go to sleep.  Sleep often empties the stomach and relieves the need to vomit.  GET HELP RIGHT AWAY IF:   Your symptoms do not improve or worsen within 2 days after treatment.  You have a fever for over 3 days.  You cannot keep down fluids after trying the medication.  MAKE SURE YOU:   Understand these instructions.  Will watch your condition.  Will get help right away if you are not doing well or get worse.   Thank you for choosing an e-visit. Your e-visit answers were reviewed by a board certified advanced clinical practitioner to complete your personal care plan. Depending upon the condition, your plan could have included both over the counter or  prescription medications. Please review your pharmacy choice. Be sure that the pharmacy you have chosen is open so that you can pick up your prescription now.  If there is a problem you may message your provider in [place] to have the prescription routed to another pharmacy. Your safety is important to [place]. If you have drug allergies check your prescription carefully.  For the next 24 hours, you can use MyChart to ask questions about today's visit, request a non-urgent call back, or ask for a work or school excuse from your e-visit provider. You will get an e-mail in the next two days asking about your experience. I hope that your e-visit has been valuable and will speed your recovery.  '5-10 minutes spent reviewing and documenting in chart.

## 2019-06-17 ENCOUNTER — Telehealth: Payer: Self-pay

## 2019-06-17 ENCOUNTER — Telehealth (INDEPENDENT_AMBULATORY_CARE_PROVIDER_SITE_OTHER): Payer: Medicare Other | Admitting: Family Medicine

## 2019-06-17 DIAGNOSIS — R197 Diarrhea, unspecified: Secondary | ICD-10-CM | POA: Diagnosis not present

## 2019-06-17 DIAGNOSIS — F250 Schizoaffective disorder, bipolar type: Secondary | ICD-10-CM

## 2019-06-17 NOTE — Progress Notes (Signed)
Virtual Visit via Telephone Note  I connected with [name] Both on 06/17/19 at  4:30 PM EDT by telephone and verified that I am speaking with the correct person using two identifiers.   I discussed the limitations, risks, security and privacy concerns of performing an evaluation and management service by telephone and the availability of in person appointments. I also discussed with the patient that there may be a patient responsible charge related to this service. The patient expressed understanding and agreed to proceed.  Location patient: home Location provider: work or home office Participants present for the call: patient, provider Patient did not have a visit in the prior 7 days to address this/these issue(s).   History of Present Illness: Pt is a 58 yo female with pmh sig for IBS mixed, h/o NAFLD, multinodular goiter, anxiety, chronic fatigue, schizoaffective disorder bipolar on lithium, HLD, insomnia, endometriosis, vitamin D deficiency followed by Dr. Betty [name].  Seen for ongoing concern of diarrhea x 5-7 days.  Seen 06/15/19 via [place].  Given zofran.  Pt endorses 5-7 loose stools per day, feeling weak,and nauseous at times.  Noticed a "red tinge" to her stool after drinking red Gatorade.   Denies fever, chills, dry lips/mouth, headaches, rhinorrhea, sore throat, cough, recent abx use.  Drinking broth, ice tea, ginger ale.  Able to eat peanut butter crackers.  Used imodium in the past.   Observations/Objective: Patient sounds cheerful and well on the phone. I do not appreciate any SOB. Speech and thought processing are grossly intact. Patient reported vitals:  Assessment and Plan: Diarrhea, unspecified type -Discussed supportive care including taking sips fluids, Imodium or other antidiarrheal medications, consider probiotic. -Advance diet as tolerated -Continue Zofran as needed for nausea -Advised for continued or worsening symptoms proceed to nearest UC or ED for  further evaluation.  Schizoaffective disorder, bipolar type ([name]) -Continue follow-up with psychiatry [name] -Patient advised to follow-up on lithium level.  Per chart review last lithium level 0.40 on 06/18/2018.   Follow Up Instructions: F/u prn in the next few days for continued symptoms.  I did not refer this patient for an OV in the next 24 hours for this/these issue(s).  I discussed the assessment and treatment plan with the patient. The patient was provided an opportunity to ask questions and all were answered. The patient agreed with the plan and demonstrated an understanding of the instructions.   The patient was advised to call back or seek an in-person evaluation if the symptoms worsen or if the condition fails to improve as anticipated.  I provided 19 minutes of non-face-to-face time during this encounter.   Billie Ruddy, MD

## 2019-06-17 NOTE — Telephone Encounter (Signed)
Left message to return phone call.

## 2019-06-17 NOTE — Telephone Encounter (Signed)
Dr. Volanda Napoleon has tried to contact patient multiple times via telephone

## 2019-06-17 NOTE — Telephone Encounter (Signed)
Copied from [place] [id number]. Topic: General - Inquiry >> Jun 17, 2019  4:29 PM Alease Frame wrote: Reason for CRM: Patient would like a call back regarding her upcoming appt . Patient has no Internet connection and would like to see about a different option regarding her appt

## 2019-06-17 NOTE — Telephone Encounter (Signed)
Spoke to pt and she stated she is still having diarrhea. Pt stated she is fatigue. Pt has a hx of IBS. Pt stated she has been taking imodium. Pt stated she has been trying to get enough fluids. Pt stated that the diarrhea seems like its going away. Pt advised that I will speak to a provider for help.

## 2019-06-17 NOTE — Progress Notes (Signed)
Doxy sent to pt's phone number at 4:30 pm, but no response. Pt called at [phone], went to VM.

## 2019-06-20 NOTE — Telephone Encounter (Signed)
Please arrange f/u appt. Thanks, BJ 

## 2019-07-11 DIAGNOSIS — F3131 Bipolar disorder, current episode depressed, mild: Secondary | ICD-10-CM | POA: Diagnosis not present

## 2019-07-19 DIAGNOSIS — F3131 Bipolar disorder, current episode depressed, mild: Secondary | ICD-10-CM | POA: Diagnosis not present

## 2019-07-25 ENCOUNTER — Encounter: Payer: Self-pay | Admitting: Family Medicine

## 2019-07-25 ENCOUNTER — Telehealth (INDEPENDENT_AMBULATORY_CARE_PROVIDER_SITE_OTHER): Payer: Medicare Other | Admitting: Family Medicine

## 2019-07-25 VITALS — Ht 60.0 in

## 2019-07-25 DIAGNOSIS — R110 Nausea: Secondary | ICD-10-CM

## 2019-07-25 DIAGNOSIS — R1033 Periumbilical pain: Secondary | ICD-10-CM

## 2019-07-25 DIAGNOSIS — J989 Respiratory disorder, unspecified: Secondary | ICD-10-CM

## 2019-07-25 DIAGNOSIS — R0989 Other specified symptoms and signs involving the circulatory and respiratory systems: Secondary | ICD-10-CM

## 2019-07-25 DIAGNOSIS — R197 Diarrhea, unspecified: Secondary | ICD-10-CM

## 2019-07-25 MED ORDER — PROMETHAZINE HCL 25 MG PO TABS: 25.0000 mg | ORAL_TABLET | Freq: Two times a day (BID) | ORAL | 0 refills | Status: DC | PRN

## 2019-07-25 MED ORDER — BUDESONIDE-FORMOTEROL FUMARATE 160-4.5 MCG/ACT IN AERO: 2.0000 | INHALATION_SPRAY | Freq: Two times a day (BID) | RESPIRATORY_TRACT | 3 refills | Status: DC

## 2019-07-25 NOTE — Progress Notes (Signed)
Virtual Visit via Video Note   I connected with [name] on 07/25/19 by a video enabled telemedicine application and verified that I am speaking with the correct person using two identifiers.  Location patient: home Location provider:work office Persons participating in the virtual visit: patient, provider  I discussed the limitations of evaluation and management by telemedicine and the availability of in person appointments. The patient expressed understanding and agreed to proceed.   HPI: [name] is a 58 yo female with history of bipolar disorder, anxiety, IBS, and reactive airway disease who is complaining about a month of diarrhea, loose stools. She is having about 3-5 loose stools per day. She is not sure about presence of blood in the stool, states that some color changes could be related to "red" Gatorade she is drinking. Nausea, no vomiting.  She is taking Zofran but is not helping, she requesting prescription for Phenergan.  Periumbilical intermittent abdominal cramps, she is not sure about alleviating factors but alleviated by defecation. Mild dyschezia. He is taking OTC Imodium.  She states that she has had diarrhea before but it has not last this long. She has not taking antibiotics for about a year. Negative for sick contact. Negative for new medications or dietary changes.  On 06/15/2019 she was evaluated because nausea and vomiting. On 06/17/2019 virtual visit due to diarrhea.  Mild rhinorrhea, attributed to allergies. Negative for unusual headache,smell or taste changes, sore throat, unusual fatigue or body aches.  States that she has felt " feverish" and has had some chills but temperature has been in normal range. Denies dysuria, gross hematuria, decreased urine output, or increasing urine frequency.  She also mentions occasional wheezing and cough, exacerbated by physical activity. She is not using Symbicort 160-4.5 mcg as instructed. She is using  albuterol inhaler as needed. Currently she is asymptomatic.   ROS: See pertinent positives and negatives per HPI.  Past Medical History:  Diagnosis Date  . Anxiety   . Bipolar 1 disorder (HCC)    HX PSYCHOSIS  . Complication of anesthesia    POST AGGITATION  . Frequency of urination   . Headache(784.0)   . History of duodenal ulcer   . Hyperlipidemia   . IBS (irritable bowel syndrome)   . Nocturia   . Pelvic pain   . Personality disorder ([employer])    W/ BORDERLINE FEATURES  . Urgency of urination     Past Surgical History:  Procedure Laterality Date  . CARDIAC CATHETERIZATION  10-09-1999  DR KATZ   NORMAL LVF/  NORMAL RCA/  NO CRITICAL DISEASE LEFT CORONARY SYSTEM  . CARDIOVASCULAR STRESS TEST  01-23-2011   NORMAL NUCLEAR STUDY/ NO ISCHEMIA/ EF 81%  . CYSTOSCOPY WITH BIOPSY N/A 06/03/2013   Procedure: CYSTOSCOPY WITH BIOPSY  INSTILLATION OF MARCAINE AND PYRIDIUM;  Surgeon: Hanley Ben, MD;  Location: [facility];  Service: Urology;  Laterality: N/A;  . LAPAROSCOPIC CHOLECYSTECTOMY  11-08-2001  . LAPAROSCOPIC LEFT SALPINGOOPHORECTOMY AND LYSYS ADHESIONS  03-10-2002  . LAPAROSCOPIC REMOVAL OVARY REMNANT  2003   [facility]  . LAPAROSCOPY N/A 12/14/2012   Procedure: LAPAROSCOPY DIAGNOSTIC;  Surgeon: Stark Klein, MD;  Location: [facility];  Service: General;  Laterality: N/A;  . TOTAL ABDOMINAL HYSTERECTOMY  2000   W/ RIGHT SALPINGOOPHORECTOMY  . TRANSTHORACIC ECHOCARDIOGRAM  10-13-2010   NORMAL LVF/  EF 55-60%    Family History  Problem Relation Age of Onset  . Sleep apnea Father   . Depression Father   . Alcohol  abuse Father   . Hypertension Father   . Migraines Sister        headaches  . Anxiety disorder Sister   . Other Mother        MAC infection  . Anxiety disorder Mother   . Dementia Mother   . Heart disease Paternal Grandmother   . Hyperlipidemia Paternal Grandmother   . Alcohol abuse Paternal Grandfather   . Diabetes Neg Hx     Social  History   Socioeconomic History  . Marital status: Married    Spouse name: Not on file  . Number of children: 0  . Years of education: college  . Highest education level: Not on file  Occupational History  . 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 Occupation: disability  Social Needs  . Financial resource strain: Not on file  . Food insecurity    Worry: Not on file    Inability: Not on file  . Transportation needs    Medical: Not on file    Non-medical: Not on file  Tobacco Use  . Smoking status: Former Smoker    Packs/day: 0.01    Years: 1.00    Pack years: 0.01    Types: Cigarettes    Quit date: 1980    Years since quitting: 40.9  . Smokeless tobacco: Never Used  . Tobacco comment: ONLY SMOKED FOR 6 MONTHS --  QUIT  YRS AGO  Substance and Sexual Activity  . Alcohol use: Yes    Comment: occ  . Drug use: No  . Sexual activity: Not on file  Lifestyle  . Physical activity    Days per week: Not on file    Minutes per session: Not on file  . Stress: Not on file  Relationships  . Social Herbalist on phone: Not on file    Gets together: Not on file    Attends religious service: Not on file    Active member of club or organization: Not on file    Attends meetings of clubs or organizations: Not on file    Relationship status: Not on file  . Intimate partner violence    Fear of current or ex partner: Not on file    Emotionally abused: Not on file    Physically abused: Not on file    Forced sexual activity: Not on file  Other Topics Concern  . 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    no tobacco   Caffeine Use: very little, three times a week    Current Outpatient Medications:  .  albuterol (PROVENTIL HFA;VENTOLIN HFA) 108 (90 Base) MCG/ACT inhaler, Inhale into the lungs., Disp: , Rfl:  .   budesonide-formoterol (SYMBICORT) 160-4.5 MCG/ACT inhaler, Inhale 2 puffs into the lungs 2 (two) times daily., Disp: 1 Inhaler, Rfl: 3 .  Cholecalciferol (VITAMIN D) 50 MCG (2000 UT) tablet, Take by mouth., Disp: , Rfl:  .  gabapentin (NEURONTIN) 300 MG capsule, Take by mouth at bedtime. , Disp: , Rfl:  .  hydrOXYzine (ATARAX/VISTARIL) 25 MG tablet, Take by mouth., Disp: , Rfl:  .  lithium carbonate (LITHOBID) 300 MG CR tablet, Take 2 tablets (600 mg total) by mouth every evening. (Patient taking differently: Take 1 tablet by mouth in the morning and 2 tablets in the  evening.), Disp: 60 tablet, Rfl: 0 .  LORazepam (ATIVAN) 0.5 MG tablet, Take 0.5 mg by mouth 2 (two) times daily. 75m at bedtime, Disp: , Rfl:  .  promethazine (PHENERGAN) 25 MG tablet, Take 1 tablet (25 mg total) by mouth every 12 (twelve) hours as needed for up to 10 days for nausea or vomiting., Disp: 20 tablet, Rfl: 0  EXAM:  VITALS per patient if applicable:Ht 5' (13.154m)   BMI 30.08 kg/m   GENERAL: alert, oriented, appears well and in no acute distress  HEENT: atraumatic, conjunctiva clear, no obvious abnormalities on inspection.  NECK: normal movements of the head and neck  LUNGS: on inspection no signs of respiratory distress, breathing rate appears normal, no obvious gross SOB, gasping or wheezing  CV: no obvious cyanosis  [name]: moves all visible extremities without noticeable abnormality  PSYCH/NEURO: pleasant and cooperative, no obvious depression or anxiety,flat affect. She is in bed during visit..  ASSESSMENT AND PLAN:  Discussed the following assessment and plan:  Nausea without vomiting - Plan: promethazine (PHENERGAN) 25 MG tablet Zofran is not helping, so discontinued. We discussed some side effects of Phenergan. If problem is persistent, further work-up will be necessary, including lithium levels.  Diarrhea, unspecified type We discussed possible etiologies. It seems like she has had this problem  in the past but has not lasted this long. ?  IBS diarrhea. She is established with gastroenterology, recommend arranging appointment if problem is not improved in 2 weeks after dietary changes. Colonoscopy in 09/2012, which was done due to abdominal pain and changes in bowel habits.  Mild diverticulosis, 10-year follow-up was recommended.  OTC Imodium could be used but I do not recommend unless 6 or more stools daily. Oral hydration, Gatorade or Pedialyte are good options. Bland and light,low fiber diet if tolerated. Clearly instructed about warning signs. F/U as needed.  Abdominal pain, periumbilical History does not suggest a serious problem. I do not think imaging is needed at this time. Clearly instructed about warning signs.  Reactive airway disease that is not asthma Problem is not well controlled. Recommend resuming Symbicort 160-4.5 mcg twice daily. Continue albuterol inhaler 2 puffs every 4-6 hours as needed for wheezing and dyspnea.   I discussed the assessment and treatment plan with the patient. She was provided an opportunity to ask questions and all were answered. She agreed with the plan and demonstrated an understanding of the instructions.   The patient was advised to call back or seek an in-person evaluation if the symptoms worsen or if the condition fails to improve as anticipated.  Return if symptoms worsen or fail to improve.    Betty JMartinique MD

## 2019-08-01 ENCOUNTER — Telehealth: Payer: Medicare Other | Admitting: Physician Assistant

## 2019-08-01 DIAGNOSIS — Z20828 Contact with and (suspected) exposure to other viral communicable diseases: Secondary | ICD-10-CM

## 2019-08-01 DIAGNOSIS — Z20822 Contact with and (suspected) exposure to covid-19: Secondary | ICD-10-CM

## 2019-08-01 DIAGNOSIS — R05 Cough: Secondary | ICD-10-CM

## 2019-08-01 DIAGNOSIS — R059 Cough, unspecified: Secondary | ICD-10-CM

## 2019-08-01 MED ORDER — BENZONATATE 100 MG PO CAPS: 100.0000 mg | ORAL_CAPSULE | Freq: Three times a day (TID) | ORAL | 0 refills | Status: DC | PRN

## 2019-08-01 NOTE — Progress Notes (Signed)
E-Visit for Corona Virus Screening   Your current symptoms could be consistent with the coronavirus.  Many health care providers can now test patients at their office but not all are.  [facility] has multiple testing sites. For information on our COVID testing locations and hours go to [email or web address]   Testing Information: The COVID-19 Community Testing sites will begin testing BY APPOINTMENT ONLY starting the week of December 15th and December 16th (see go-live dates by location below).  You can begin scheduling online at [email or web address]  If you do not have access to a smart phone or computer you may call [phone] for an appointment. . In addition, starting the week of December 14th we will move INDOORS for testing (see locations below). . Community testing site appointment hours will be 8 a.m. to 3:30 p.m.   Testing Locations: [facility] will begin Tuesday December 15th. Closed on Monday December 14th to relocate indoors to [address] [place] [address] [facility] Appointments will begin Wednesday December 16th. Closed on Tuesday December 15th for move to indoors at [facility]. [address], [place], [place] [address] [facility] Appointments will begin Wednesday December 16th. Closed on Tuesday December 15th for move to indoors at [address] [place] [address]   We are enrolling you in our [facility] . Daily you will receive a questionnaire within the [facility] website. Our COVID 19 response team will be monitoring your responses daily.  Please quarantine yourself while awaiting your test results. If you develop fever/cough/breathlessness, please stay home for 10 days with improving symptoms and until you have had 24 hours of no fever (without taking a fever reducer).  You should wear a mask or cloth face covering over your nose and mouth if you must be around other people or animals, including pets (even at  home). Try to stay at least 6 feet away from other people. This will protect the people around you.  Please continue good preventive care measures, including:  frequent hand-washing, avoid touching your face, cover coughs/sneezes, stay out of crowds and keep a 6 foot distance from others.  COVID-19 is a respiratory illness with symptoms that are similar to the flu. Symptoms are typically mild to moderate, but there have been cases of severe illness and death due to the virus.   The following symptoms may appear 2-14 days after exposure: . Fever . Cough . Shortness of breath or difficulty breathing . Chills . Repeated shaking with chills . Muscle pain . Headache . Sore throat . New loss of taste or smell . Fatigue . Congestion or runny nose . Nausea or vomiting . Diarrhea  Go to the nearest hospital ED for assessment if fever/cough/breathlessness are severe or illness seems like a threat to life.  It is vitally important that if you feel that you have an infection such as this virus or any other virus that you stay home and away from places where you may spread it to others.  You should avoid contact with people age 45 and older.   You can use medication such as A prescription cough medication called Tessalon Perles 100 mg. You may take 1-2 capsules every 8 hours as needed for cough and A prescription cough medication called Phenergan DM 6.25 mg/15 mg. You make take one teaspoon / 5 ml every 4-6 hours as needed for cough  You may also take acetaminophen (Tylenol) as needed for fever.  Reduce your risk of any  infection by using the same precautions used for avoiding the common cold or flu:  [name] Kitchen Wash your hands often with soap and warm water for at least 20 seconds.  If soap and water are not readily available, use an alcohol-based hand sanitizer with at least 60% alcohol.  . If coughing or sneezing, cover your mouth and nose by coughing or sneezing into the elbow areas of your shirt or coat,  into a tissue or into your sleeve (not your hands). . Avoid shaking hands with others and consider head nods or verbal greetings only. . Avoid touching your eyes, nose, or mouth with unwashed hands.  . Avoid close contact with people who are sick. . Avoid places or events with large numbers of people in one location, like concerts or sporting events. . Carefully consider travel plans you have or are making. . If you are planning any travel outside or inside the [place], visit the CDC's Travelers' Health webpage for the latest health notices. . If you have some symptoms but not all symptoms, continue to monitor at home and seek medical attention if your symptoms worsen. . If you are having a medical emergency, call 911.  HOME CARE . Only take medications as instructed by your medical team. . Drink plenty of fluids and get plenty of rest. . A steam or ultrasonic humidifier can help if you have congestion.   GET HELP RIGHT AWAY IF YOU HAVE EMERGENCY WARNING SIGNS** FOR COVID-19. If you or someone is showing any of these signs seek emergency medical care immediately. Call 911 or proceed to your closest emergency facility if: . You develop worsening high fever. . Trouble breathing . Bluish lips or face . Persistent pain or pressure in the chest . New confusion . Inability to wake or stay awake . You cough up blood. . Your symptoms become more severe  **This list is not all possible symptoms. Contact your medical provider for any symptoms that are sever or concerning to you.  MAKE SURE YOU   Understand these instructions.  Will watch your condition.  Will get help right away if you are not doing well or get worse.  Your e-visit answers were reviewed by a board certified advanced clinical practitioner to complete your personal care plan.  Depending on the condition, your plan could have included both over the counter or prescription medications.  If there is a problem please reply once you  have received a response from your provider.  Your safety is important to [place].  If you have drug allergies check your prescription carefully.    You can use MyChart to ask questions about today's visit, request a non-urgent call back, or ask for a work or school excuse for 24 hours related to this e-Visit. If it has been greater than 24 hours you will need to follow up with your provider, or enter a new e-Visit to address those concerns. You will get an e-mail in the next two days asking about your experience.  I hope that your e-visit has been valuable and will speed your recovery. Thank you for using e-visits.   Greater than 5 minutes, yet less than 10 minutes of time have been spent researching, coordinating and implementing care for this patient today.

## 2019-08-02 ENCOUNTER — Other Ambulatory Visit: Payer: Self-pay

## 2019-08-02 MED ORDER — FLUTICASONE-SALMETEROL 250-50 MCG/DOSE IN AEPB: 1.0000 | INHALATION_SPRAY | Freq: Two times a day (BID) | RESPIRATORY_TRACT | 3 refills | Status: DC

## 2019-08-03 ENCOUNTER — Other Ambulatory Visit: Payer: Medicare Other

## 2019-08-04 ENCOUNTER — Other Ambulatory Visit: Payer: Self-pay

## 2019-08-04 ENCOUNTER — Ambulatory Visit: Payer: Medicare Other | Attending: Internal Medicine

## 2019-08-04 DIAGNOSIS — Z20828 Contact with and (suspected) exposure to other viral communicable diseases: Secondary | ICD-10-CM | POA: Diagnosis not present

## 2019-08-04 DIAGNOSIS — Z20822 Contact with and (suspected) exposure to covid-19: Secondary | ICD-10-CM

## 2019-08-06 ENCOUNTER — Telehealth: Payer: Medicare Other | Admitting: Physician Assistant

## 2019-08-06 DIAGNOSIS — J069 Acute upper respiratory infection, unspecified: Secondary | ICD-10-CM

## 2019-08-06 LAB — NOVEL CORONAVIRUS, NAA: SARS-CoV-2, NAA: NOT DETECTED

## 2019-08-06 MED ORDER — FLUTICASONE PROPIONATE 50 MCG/ACT NA SUSP: 2.0000 | Freq: Every day | NASAL | 0 refills | Status: DC

## 2019-08-06 MED ORDER — AMOXICILLIN 875 MG PO TABS: 875.0000 mg | ORAL_TABLET | Freq: Two times a day (BID) | ORAL | 0 refills | Status: DC

## 2019-08-06 NOTE — Progress Notes (Signed)
I have spent 5 minutes in review of e-visit questionnaire, review and updating patient chart, medical decision making and response to patient.   Anyela Napierkowski Cody Lelan Cush, PA-C    

## 2019-08-06 NOTE — Addendum Note (Signed)
Addended by: Brunetta Jeans on: 08/06/2019 02:24 PM   Modules accepted: Orders

## 2019-08-06 NOTE — Progress Notes (Signed)
We are sorry you are not feeling well.  Here is how we plan to help!  Based on what you have shared with me, it looks like you may have a viral upper respiratory infection.  I see your recent COVID test was negative but keep at home until feeling better. Upper respiratory infections are caused by a large number of viruses; however, rhinovirus is the most common cause.   Symptoms vary from person to person, with common symptoms including sore throat, cough, fatigue or lack of energy and feeling of general discomfort.  A low-grade fever of up to 100.4 may present, but is often uncommon.  Symptoms vary however, and are closely related to a person's age or underlying illnesses.  The most common symptoms associated with an upper respiratory infection are nasal discharge or congestion, cough, sneezing, headache and pressure in the ears and face.  These symptoms usually persist for about 3 to 10 days, but can last up to 2 weeks.  It is important to know that upper respiratory infections do not cause serious illness or complications in most cases.    Upper respiratory infections can be transmitted from person to person, with the most common method of transmission being a person's hands.  The virus is able to live on the skin and can infect other persons for up to 2 hours after direct contact.  Also, these can be transmitted when someone coughs or sneezes; thus, it is important to cover the mouth to reduce this risk.  To keep the spread of the illness at [place], good hand hygiene is very important.  This is an infection that is most likely caused by a virus. There are no specific treatments other than to help you with the symptoms until the infection runs its course.  We are sorry you are not feeling well.  Here is how we plan to help!   For nasal congestion, you may use an oral decongestants such as Mucinex D or if you have glaucoma or high blood pressure use plain Mucinex.  Saline nasal spray or nasal drops can  help and can safely be used as often as needed for congestion.  For your congestion, I have prescribed Fluticasone nasal spray one spray in each nostril twice a day  If you do not have a history of heart disease, hypertension, diabetes or thyroid disease, prostate/bladder issues or glaucoma, you may also use Sudafed to treat nasal congestion.  It is highly recommended that you consult with a pharmacist or your primary care physician to ensure this medication is safe for you to take.     If you have a cough, you may use cough suppressants such as Delsym and Robitussin.  If you have glaucoma or high blood pressure, you can also use Coricidin HBP.   For cough I want you to continue the Tessalon given at previous visit.   If you have a sore or scratchy throat, use a saltwater gargle-  to  teaspoon of salt dissolved in a 4-ounce to 8-ounce glass of warm water.  Gargle the solution for approximately 15-30 seconds and then spit.  It is important not to swallow the solution.  You can also use throat lozenges/cough drops and Chloraseptic spray to help with throat pain or discomfort.  Warm or cold liquids can also be helpful in relieving throat pain.  For headache, pain or general discomfort, you can use Ibuprofen or Tylenol as directed.   Some authorities believe that zinc sprays or the use  of Echinacea may shorten the course of your symptoms.   HOME CARE . Only take medications as instructed by your medical team. . Be sure to drink plenty of fluids. Water is fine as well as fruit juices, sodas and electrolyte beverages. You may want to stay away from caffeine or alcohol. If you are nauseated, try taking small sips of liquids. How do you know if you are getting enough fluid? Your urine should be a pale yellow or almost colorless. . Get rest. . Taking a steamy shower or using a humidifier may help nasal congestion and ease sore throat pain. You can place a towel over your head and breathe in the steam from  hot water coming from a faucet. . Using a saline nasal spray works much the same way. . Cough drops, hard candies and sore throat lozenges may ease your cough. . Avoid close contacts especially the very young and the elderly . Cover your mouth if you cough or sneeze . Always remember to wash your hands.   GET HELP RIGHT AWAY IF: . You develop worsening fever. . If your symptoms do not improve within 10 days . You develop yellow or green discharge from your nose over 3 days. . You have coughing fits . You develop a severe head ache or visual changes. . You develop shortness of breath, difficulty breathing or start having chest pain . Your symptoms persist after you have completed your treatment plan  MAKE SURE YOU   Understand these instructions.  Will watch your condition.  Will get help right away if you are not doing well or get worse.  Your e-visit answers were reviewed by a board certified advanced clinical practitioner to complete your personal care plan. Depending upon the condition, your plan could have included both over the counter or prescription medications. Please review your pharmacy choice. If there is a problem, you may call our nursing hot line at and have the prescription routed to another pharmacy. Your safety is important to [place]. If you have drug allergies check your prescription carefully.   You can use MyChart to ask questions about today's visit, request a non-urgent call back, or ask for a work or school excuse for 24 hours related to this e-Visit. If it has been greater than 24 hours you will need to follow up with your provider, or enter a new e-Visit to address those concerns. You will get an e-mail in the next two days asking about your experience.  I hope that your e-visit has been valuable and will speed your recovery. Thank you for using e-visits.

## 2019-08-22 ENCOUNTER — Telehealth: Payer: Self-pay | Admitting: Family Medicine

## 2019-08-22 DIAGNOSIS — R4781 Slurred speech: Secondary | ICD-10-CM

## 2019-08-22 NOTE — Telephone Encounter (Signed)
Please advise 

## 2019-08-22 NOTE — Telephone Encounter (Signed)
Okay to place new referral? 

## 2019-08-22 NOTE — Telephone Encounter (Signed)
Patient is calling to request another referral to be placed to [facility]- last referral was placed 05/2018.  Patient is reporting speech slurring for several months and hard to chew on the left side of her mouth. And patient reports no face dropping.  And extreme fatigue.  Please advise Cb- [phone]

## 2019-08-22 NOTE — Telephone Encounter (Signed)
Patient is calling for medication for a yeast infection after taking amoxicillin (AMOXIL) 875 MG tablet [id number] Please advise Cb- [phone]  Preferred [name]

## 2019-08-23 NOTE — Telephone Encounter (Signed)
Spoke with pt, she has been using the monistat with no relief, is requesting rx to be sent in.  Prescribing provider was an e-visit through [facility] - not sure how to get them to prescribe it for her?

## 2019-08-23 NOTE — Telephone Encounter (Signed)
Previous referral has expired, new referral placed per pt request. They will contact pt to schedule appt.

## 2019-08-23 NOTE — Telephone Encounter (Signed)
She could request treatment from provider that prescribed antibiotics or she can try OTC Monistat vaginal cream.  Thanks, BJ

## 2019-08-23 NOTE — Telephone Encounter (Signed)
She is already established with neurologist, Dr Andrena Mews seen in 09/2018. I think she can call her office to arrange appointment. Referral can be placed if she needs another one. Thanks, BJ

## 2019-08-24 ENCOUNTER — Other Ambulatory Visit: Payer: Self-pay | Admitting: Family Medicine

## 2019-08-24 MED ORDER — TERCONAZOLE 0.4 % VA CREA: 1.0000 | TOPICAL_CREAM | Freq: Every day | VAGINAL | 0 refills | Status: DC

## 2019-08-24 NOTE — Telephone Encounter (Signed)
Topical rx sent. She needs to arrange appt if she is not any better. Thanks, BJ

## 2019-08-24 NOTE — Telephone Encounter (Signed)
Message sent to pt making her aware Rx was sent in & to make an appt for further evaluation if symptoms don't improve.

## 2019-08-25 DIAGNOSIS — F3131 Bipolar disorder, current episode depressed, mild: Secondary | ICD-10-CM | POA: Diagnosis not present

## 2019-09-05 ENCOUNTER — Telehealth (INDEPENDENT_AMBULATORY_CARE_PROVIDER_SITE_OTHER): Payer: Medicare Other | Admitting: Neurology

## 2019-09-05 ENCOUNTER — Encounter: Payer: Self-pay | Admitting: Neurology

## 2019-09-05 ENCOUNTER — Other Ambulatory Visit: Payer: Self-pay

## 2019-09-05 VITALS — Ht 60.0 in | Wt 146.0 lb

## 2019-09-05 DIAGNOSIS — M279 Disease of jaws, unspecified: Secondary | ICD-10-CM

## 2019-09-05 DIAGNOSIS — R4789 Other speech disturbances: Secondary | ICD-10-CM | POA: Diagnosis not present

## 2019-09-05 NOTE — Progress Notes (Signed)
Virtual Visit via Video Note The purpose of this virtual visit is to provide medical care while limiting exposure to the novel coronavirus.    Consent was obtained for video visit:  Yes.   Answered questions that patient had about telehealth interaction:  Yes.   I discussed the limitations, risks, security and privacy concerns of performing an evaluation and management service by telemedicine. I also discussed with the patient that there may be a patient responsible charge related to this service. The patient expressed understanding and agreed to proceed.  Pt location: Home Physician Location: office Name of referring provider:  [name], [name], MD I connected with [name] at patients initiation/request on 09/05/2019 at 10:30 AM EST by video enabled telemedicine application and verified that I am speaking with the correct person using two identifiers. Pt MRN:  [id number] Pt DOB:  08/12/1961 Video Participants:  [name]   History of Present Illness: This is a 59 y.o. female returning with new complaints of jaw tightness.  Starting around summer 2020, she began having sensation that her left jaw gets stuck.  It occurs when she is chewing.  She denies jaw pain, clicking, ear pain, facial numbness/tingling.  She reports having spells of slurred speech and it is worse when she is fatigued.  She has chronic fatigue and has been extensively evaluated by neurology which has been negative for primary neurological explanation for symptoms.   Observations/Objective:   Vitals:   09/05/19 0900  Weight: 146 lb (66.2 kg)  Height: 5' (1.524 m)   Patient is awake, alert, and appears comfortable.  Extraocular muscles are intact. No ptosis.  Face is symmetric and facial movements are intact testing frontalis, orbicularis oculi, buccinator, orbicularis oris.  Speech is not dysarthric. Tongue is midline and side-to-side movements are intact. Antigravity in all extremities.    DATA: CT  head 10/22/2017:  Mild atrophy for age.  No acute intracranial abnormalities  MRI brain 09/23/2014: Abnormal MRI scan of the brain showing tiny bilateral subcortical and periventricular nonspecific white matter hyperintensities with the differential discussed above. No enhancing lesions are noted.  MRI cervical and thoracic spine wwo contrast 06/11/2011:  Negative cervical and thoracic spine MRI. No evidence of multiple sclerosis. Central canal and foramina widely patent all levels.  NCS/EMG of the right arm and leg 09/21/2013:  Nerve conduction studies done on both upper extremities and on the right lower extremity were normal. No evidence of a peripheral neuropathy is seen. EMG evaluation was not done. Repetitive nerve stimulation on the right hand was done at 3 Hz stimulation, and again at 40 Hz stimulation without evidence of a neuromuscular transmission disorder. Repetitive nerve stimulation studies were completely normal.  Assessment and Plan:  1. Left jaw tightness triggered by chewing.  I cannot find any neurological explanation for these symptoms as they are not suggestive of trigeminal neuralgia or atypical facial pain.  She denies pain, tingling, weakness, or numbness.  ?TMJ.  Recommend follow-up with dentist to see if there are any specific exercises that may help  2. Spells of slurred speech, likely related to fatigue since they only tend to occur when she is tired.  Speech is clear on my evaluation and given that symptoms are not constant, it makes intracranial pathology very unlikely.     Follow Up Instructions:   I discussed the assessment and treatment plan with the patient. The patient was provided an opportunity to ask questions and all were answered. The patient agreed with  the plan and demonstrated an understanding of the instructions.   The patient was advised to call back or seek an in-person evaluation if the symptoms worsen or if the condition fails to improve as  anticipated.    Alda Berthold, DO

## 2019-09-14 DIAGNOSIS — F3131 Bipolar disorder, current episode depressed, mild: Secondary | ICD-10-CM | POA: Diagnosis not present

## 2019-10-24 DIAGNOSIS — F3131 Bipolar disorder, current episode depressed, mild: Secondary | ICD-10-CM | POA: Diagnosis not present

## 2019-10-27 ENCOUNTER — Telehealth (INDEPENDENT_AMBULATORY_CARE_PROVIDER_SITE_OTHER): Payer: Medicare Other | Admitting: Family Medicine

## 2019-10-27 ENCOUNTER — Other Ambulatory Visit: Payer: Self-pay

## 2019-10-27 DIAGNOSIS — B379 Candidiasis, unspecified: Secondary | ICD-10-CM | POA: Diagnosis not present

## 2019-10-27 DIAGNOSIS — J029 Acute pharyngitis, unspecified: Secondary | ICD-10-CM

## 2019-10-27 DIAGNOSIS — R599 Enlarged lymph nodes, unspecified: Secondary | ICD-10-CM

## 2019-10-27 MED ORDER — FLUCONAZOLE 150 MG PO TABS: 150.0000 mg | ORAL_TABLET | Freq: Once | ORAL | 0 refills | Status: AC

## 2019-10-27 MED ORDER — AMOXICILLIN 500 MG PO CAPS: 500.0000 mg | ORAL_CAPSULE | Freq: Two times a day (BID) | ORAL | 0 refills | Status: DC

## 2019-10-27 NOTE — Progress Notes (Signed)
Virtual Visit via Video Note  I connected with [name]  on 10/27/19 at  1:20 PM EST by a video enabled telemedicine application and verified that I am speaking with the correct person using two identifiers.  Location patient: home, [place] Location provider:work or home office Persons participating in the virtual visit: patient, provider, wife  I discussed the limitations of evaluation and management by telemedicine and the availability of in person appointments. The patient expressed understanding and agreed to proceed.   HPI:  Acute visit for sore throat: -started a few days ago -L sided sore throat,  -a close contact in the house had strep throat had strep confirmed last week -denies: cough, congestion, fever, difficulty breathing or swallowing, NVD, rashes -no fish with bones or any foods she thinks could have caused this -no other known sick contacts, covid19 exposures, using all covid precautions out side of the home -denies smoking or drinking history  ROS: See pertinent positives and negatives per HPI.  Past Medical History:  Diagnosis Date  . Anxiety   . Bipolar 1 disorder (HCC)    HX PSYCHOSIS  . Complication of anesthesia    POST AGGITATION  . Frequency of urination   . Headache(784.0)   . History of duodenal ulcer   . Hyperlipidemia   . IBS (irritable bowel syndrome)   . Nocturia   . Pelvic pain   . Personality disorder ([place])    W/ BORDERLINE FEATURES  . Urgency of urination     Past Surgical History:  Procedure Laterality Date  . CARDIAC CATHETERIZATION  10-09-1999  DR KATZ   NORMAL LVF/  NORMAL RCA/  NO CRITICAL DISEASE LEFT CORONARY SYSTEM  . CARDIOVASCULAR STRESS TEST  01-23-2011   NORMAL NUCLEAR STUDY/ NO ISCHEMIA/ EF 81%  . CYSTOSCOPY WITH BIOPSY N/A 06/03/2013   Procedure: CYSTOSCOPY WITH BIOPSY  INSTILLATION OF MARCAINE AND PYRIDIUM;  Surgeon: Hanley Ben, MD;  Location: [facility];  Service: Urology;  Laterality: N/A;  .  LAPAROSCOPIC CHOLECYSTECTOMY  11-08-2001  . LAPAROSCOPIC LEFT SALPINGOOPHORECTOMY AND LYSYS ADHESIONS  03-10-2002  . LAPAROSCOPIC REMOVAL OVARY REMNANT  2003   [place] [place]  . LAPAROSCOPY N/A 12/14/2012   Procedure: LAPAROSCOPY DIAGNOSTIC;  Surgeon: Stark Klein, MD;  Location: [facility];  Service: General;  Laterality: N/A;  . TOTAL ABDOMINAL HYSTERECTOMY  2000   W/ RIGHT SALPINGOOPHORECTOMY  . TRANSTHORACIC ECHOCARDIOGRAM  10-13-2010   NORMAL LVF/  EF 55-60%    Family History  Problem Relation Age of Onset  . Sleep apnea Father   . Depression Father   . Alcohol abuse Father   . Hypertension Father   . Migraines Sister        headaches  . Anxiety disorder Sister   . Other Mother        MAC infection  . Anxiety disorder Mother   . Dementia Mother   . Heart disease Paternal Grandmother   . Hyperlipidemia Paternal Grandmother   . Alcohol abuse Paternal Grandfather   . Diabetes Neg Hx     SOCIAL HX: see hpi   Current Outpatient Medications:  .  albuterol (PROVENTIL HFA;VENTOLIN HFA) 108 (90 Base) MCG/ACT inhaler, Inhale into the lungs., Disp: , Rfl:  .  benzonatate (TESSALON) 100 MG capsule, Take 1-2 capsules (100-200 mg total) by mouth 3 (three) times daily as needed for cough., Disp: 40 capsule, Rfl: 0 .  buPROPion (WELLBUTRIN SR) 150 MG 12 hr tablet, TAKE 3 TABLETS BY MOUTH IN THE MORNING, Disp: ,  Rfl:  .  Cholecalciferol (VITAMIN D) 50 MCG (2000 UT) tablet, Take by mouth., Disp: , Rfl:  .  fluticasone (FLONASE) 50 MCG/ACT nasal spray, Place 2 sprays into both nostrils daily., Disp: 16 g, Rfl: 0 .  Fluticasone-Salmeterol (ADVAIR DISKUS) 250-50 MCG/DOSE AEPB, Inhale 1 puff into the lungs 2 (two) times daily., Disp: 60 each, Rfl: 3 .  gabapentin (NEURONTIN) 300 MG capsule, Take by mouth at bedtime. , Disp: , Rfl:  .  hydrOXYzine (ATARAX/VISTARIL) 25 MG tablet, Take by mouth., Disp: , Rfl:  .  lithium carbonate (LITHOBID) 300 MG CR tablet, Take 2 tablets (600 mg total) by mouth  every evening. (Patient taking differently: Take 1 tablet by mouth in the morning and 2 tablets in the evening.), Disp: 60 tablet, Rfl: 0 .  LORazepam (ATIVAN) 0.5 MG tablet, Take 0.5 mg by mouth 2 (two) times daily. 33m at bedtime, Disp: , Rfl:  .  promethazine (PHENERGAN) 25 MG tablet, Take 1 tablet (25 mg total) by mouth every 12 (twelve) hours as needed for up to 10 days for nausea or vomiting., Disp: 20 tablet, Rfl: 0 .  terconazole (TERAZOL 7) 0.4 % vaginal cream, Place 1 applicator vaginally at bedtime., Disp: 45 g, Rfl: 0  EXAM:  VITALS per patient if applicable:  GENERAL: alert, oriented, appears well and in no acute distress  HEENT: atraumatic, conjunttiva clear, no obvious abnormalities on inspection of external nose and ears, she was not able to position herself in a way in which I could see her throat. Moist mucus membranes and tongue appears normal. She did self exam in the bathroom with a flash light and reports some erythema of the throat.  NECK: normal movements of the head and neck  LUNGS: on inspection no signs of respiratory distress, breathing rate appears normal, no obvious gross SOB, gasping or wheezing  CV: no obvious cyanosis  MS: moves all visible extremities without noticeable abnormality  PSYCH/NEURO: pleasant and cooperative, no obvious depression or anxiety, speech and thought processing grossly intact  ASSESSMENT AND PLAN:  Discussed the following assessment and plan:  Sore throat  Glands swollen  Yeast infection  -we discussed possible serious and likely etiologies, options for evaluation and workup, limitations of telemedicine visit vs in person visit, treatment, treatment risks and precautions. Given sore throat, swollen glands and recent strep exposure query strep throat vs viral illness vs other. Pt prefers to try to treat via telemedicine empirically with Amox 5025mbid x 10 days rather then risking or undertaking an in person visit at this  moment. She also asked for diflucan as reports gets yeast infections with abx.  Patient agrees to seek prompt in person care if worsening, new symptoms arise, or if is not improving with treatment. [facility]   I discussed the assessment and treatment plan with the patient. The patient was provided an opportunity to ask questions and all were answered. The patient agreed with the plan and demonstrated an understanding of the instructions.   The patient was advised to call back or seek an in-person evaluation if the symptoms worsen or if the condition fails to improve as anticipated.   HaLucretia KernDO

## 2019-10-27 NOTE — Patient Instructions (Signed)
-  I sent the medication(s) we discussed to your pharmacy: Meds ordered this encounter  Medications  . amoxicillin (AMOXIL) 500 MG capsule    Sig: Take 1 capsule (500 mg total) by mouth 2 (two) times daily.    Dispense:  20 capsule    Refill:  0  . fluconazole (DIFLUCAN) 150 MG tablet    Sig: Take 1 tablet (150 mg total) by mouth once for 1 dose.    Dispense:  1 tablet    Refill:  0    Please let us know if you have any questions or concerns regarding this prescription.  I hope you are feeling better soon! Seek care promptly if your symptoms worsen, new concerns arise or you are not improving with treatment.  

## 2019-11-01 DIAGNOSIS — Z23 Encounter for immunization: Secondary | ICD-10-CM | POA: Diagnosis not present

## 2019-11-08 ENCOUNTER — Telehealth: Payer: Self-pay | Admitting: Family Medicine

## 2019-11-08 NOTE — Telephone Encounter (Signed)
Please advised  

## 2019-11-08 NOTE — Telephone Encounter (Signed)
Pt's physiatrist Dr. Norma Fredrickson is needing her to have test done for TSH, CMP, and Lithium levels. Pt is wondering if those labs can be done her and then fax over the results to his office. [phone]  Pt stated she has a forum with the information from her physiatrist If needed.  Pt can be reached at [phone]   Pt's spouse who is also a pt of [name] is coming for her lab appt tomorrow and she was hoping to catch a ride with her since she does not drive often. Pt would like a call back today if possible

## 2019-11-08 NOTE — Telephone Encounter (Signed)
She can bring order with her and we can fax results to her psychiatrist's office. Thanks, BJ

## 2019-11-10 DIAGNOSIS — Z79899 Other long term (current) drug therapy: Secondary | ICD-10-CM | POA: Diagnosis not present

## 2019-11-11 DIAGNOSIS — F450 Somatization disorder: Secondary | ICD-10-CM | POA: Diagnosis not present

## 2019-11-11 DIAGNOSIS — F3131 Bipolar disorder, current episode depressed, mild: Secondary | ICD-10-CM | POA: Diagnosis not present

## 2019-11-21 DIAGNOSIS — Z79899 Other long term (current) drug therapy: Secondary | ICD-10-CM | POA: Diagnosis not present

## 2019-11-28 ENCOUNTER — Other Ambulatory Visit: Payer: Self-pay

## 2019-11-28 ENCOUNTER — Encounter: Payer: Self-pay | Admitting: Family Medicine

## 2019-11-28 ENCOUNTER — Ambulatory Visit (INDEPENDENT_AMBULATORY_CARE_PROVIDER_SITE_OTHER): Payer: Medicare Other | Admitting: Family Medicine

## 2019-11-28 VITALS — BP 120/80 | HR 56 | Temp 98.1°F | Resp 12 | Ht 60.0 in | Wt 152.2 lb

## 2019-11-28 DIAGNOSIS — E559 Vitamin D deficiency, unspecified: Secondary | ICD-10-CM | POA: Diagnosis not present

## 2019-11-28 DIAGNOSIS — E221 Hyperprolactinemia: Secondary | ICD-10-CM

## 2019-11-28 DIAGNOSIS — F250 Schizoaffective disorder, bipolar type: Secondary | ICD-10-CM

## 2019-11-28 DIAGNOSIS — R945 Abnormal results of liver function studies: Secondary | ICD-10-CM

## 2019-11-28 DIAGNOSIS — R739 Hyperglycemia, unspecified: Secondary | ICD-10-CM | POA: Diagnosis not present

## 2019-11-28 DIAGNOSIS — R7989 Other specified abnormal findings of blood chemistry: Secondary | ICD-10-CM

## 2019-11-28 DIAGNOSIS — Z0000 Encounter for general adult medical examination without abnormal findings: Secondary | ICD-10-CM

## 2019-11-28 DIAGNOSIS — E782 Mixed hyperlipidemia: Secondary | ICD-10-CM

## 2019-11-28 NOTE — Progress Notes (Signed)
Chief Complaint  Patient presents with  . Medicare Wellness  . Follow-up    HPI: [name] is a 59 y.o. female, who is here today for her AWV.  Last AWV: > a year ago  Regular exercise 3 or more time per week: Walks her dog 5 times per day. Following a healthy diet: Not consistently. She lives with her wife.  Independent ADL's and IADL's. No falls in the past year and denies depression symptoms.  Functional Status Survey: Is the patient deaf or have difficulty hearing?: No Does the patient have difficulty seeing, even when wearing glasses/contacts?: No Does the patient have difficulty concentrating, remembering, or making decisions?: Yes Does the patient have difficulty walking or climbing stairs?: No Does the patient have difficulty dressing or bathing?: No Does the patient have difficulty doing errands alone such as visiting a doctor's office or shopping?: No   Fall Risk  11/28/2019 09/05/2019 10/08/2018  Falls in the past year? 0 0 1  Number falls in past yr: 0 0 1  Injury with Fall? 0 0 0  Risk for fall due to : - - Other (Comment)  Follow up Education provided - Falls evaluation completed;Education provided;Falls prevention discussed   Providers she sees regularly: Eye care provider: She does not remember name of provider. Last seen 6 months ago. Psychiatrist, Dr Casimiro Needle. Last visit in 08/2019. Neurologist, Dr Posey Pronto. Last seen 08/2019. Gastroenterologist, Dr Man.  Depression screen [facility] 2/9 11/28/2019  Decreased Interest 0  Down, Depressed, Hopeless 0  PHQ - 2 Score 0  Some recent data might be hidden    Mini-Cog - 11/28/19 1446    Normal clock drawing test?  yes       Hearing Screening   _0  _1  _2  _3  _4  _5  _6  _7  _8   Right ear:           Left ear:             Visual Acuity Screening   Right eye Left eye Both eyes  Without correction: _9  With correction:       Chronic medical problems: Vit D  deficiency,chronic fatigue,allergies,borderline personality disorder,and HLD   Borderline personality and bipolar disorder , follows with psychiatrist regularly. Pap smear: S/P hysterectomy Hx of abnormal pap smears: Negative.  Immunization History  Administered Date(s) Administered  . Influenza Split 07/18/2011  . Influenza, Quadrivalent, Recombinant, Inj, Pf 05/19/2019  . Influenza,inj,Quad PF,6+ Mos 07/02/2018  . Pneumococcal Polysaccharide-23 07/02/2018  . Td 06/13/2009  . Tdap 06/13/2009    Mammogram: 09/2017. Colonoscopy: 10/11/12. DEXA: N/A  Hep C screening: 03/2013  Elevated glucose at 125 in 07/2019. No hx of diabetes. Negative polydipsia,polyuria, or polyphagia.  HLD: She is on non pharmacologic treatment.  Component     Latest Ref Rng & Units 05/15/2017  Cholesterol     0 - 200 mg/dL 128  Triglycerides     0.0 - 149.0 mg/dL 119.0  HDL Cholesterol     >39.00 mg/dL 49.90  VLDL     0.0 - 40.0 mg/dL 23.8  LDL (calc)     0 - 99 mg/dL 54  Total CHOL/HDL Ratio      3  NonHDL      78.15   Hyperprolactinemia: She does not remember this problem. She has not noted nipple discharge or tenderness.  Prolactin elevated at 123 4 years ago.  Vit D deficiency: She is not on vit D supplementation. Abnormal LFT's. She denies high alcohol  intake.  She has not noted jaundice.  Total Bilirubin     0.2 - 1.2 mg/dL 1.4 (H)  Alkaline Phosphatase     39 - 117 U/L 74  AST     0 - 37 U/L 37  ALT     0 - 35 U/L 74 (H)  Total Protein     6.0 - 8.3 g/dL 7.5  Albumin     3.5 - 5.2 g/dL 4.1  Calcium     8.4 - 10.5 mg/dL 10.1  GFR     >60.00 mL/min     Review of Systems  Constitutional: Positive for fatigue. Negative for appetite change and fever.  HENT: Negative for hearing loss, mouth sores and sore throat.   Eyes: Negative for redness and visual disturbance.  Respiratory: Negative for cough and wheezing.   Cardiovascular: Negative for chest pain and leg swelling.   Gastrointestinal: Positive for abdominal pain (No more than usual, following with GI.). Negative for nausea and vomiting.       No changes in bowel habits.  Endocrine: Negative for cold intolerance, heat intolerance, polydipsia, polyphagia and polyuria.  Genitourinary: Negative for decreased urine volume, dysuria, hematuria, vaginal bleeding and vaginal discharge.  Musculoskeletal: Negative for gait problem and myalgias.  Skin: Negative for color change and rash.  Allergic/Immunologic: Positive for environmental allergies.  Neurological: Negative for syncope, weakness and headaches.  Hematological: Negative for adenopathy. Does not bruise/bleed easily.  Psychiatric/Behavioral: Negative for confusion and hallucinations. The patient is not nervous/anxious.   All other systems reviewed and are negative.   Current Outpatient Medications on File Prior to Visit  Medication Sig Dispense Refill  . buPROPion (WELLBUTRIN XL) 150 MG 24 hr tablet TAKE 1 TABLET BY MOUTH IN THE MORNING FOR 4 DAYS AND THEN TAKE 2 IN THE MORNING FOR 5 DAYS AND THEN TAKE 3 IN THE MORNING    . Cholecalciferol (VITAMIN D) 50 MCG (2000 UT) tablet Take by mouth.    . gabapentin (NEURONTIN) 300 MG capsule Take by mouth at bedtime.     . hydrOXYzine (ATARAX/VISTARIL) 25 MG tablet Take by mouth.    . lithium carbonate (LITHOBID) 300 MG CR tablet Take 2 tablets (600 mg total) by mouth every evening. (Patient taking differently: Take 1 tablet by mouth in the morning and 2 tablets in the evening.) 60 tablet 0  . LORazepam (ATIVAN) 0.5 MG tablet Take 0.5 mg by mouth 2 (two) times daily. 66m at bedtime    . albuterol (PROVENTIL HFA;VENTOLIN HFA) 108 (90 Base) MCG/ACT inhaler Inhale into the lungs.    . fluticasone (FLONASE) 50 MCG/ACT nasal spray Place 2 sprays into both nostrils daily. (Patient not taking: Reported on 11/28/2019) 16 g 0  . Fluticasone-Salmeterol (ADVAIR DISKUS) 250-50 MCG/DOSE AEPB Inhale 1 puff into the lungs 2 (two)  times daily. (Patient not taking: Reported on 11/28/2019) 60 each 3  . promethazine (PHENERGAN) 25 MG tablet Take 1 tablet (25 mg total) by mouth every 12 (twelve) hours as needed for up to 10 days for nausea or vomiting. 20 tablet 0   No current facility-administered medications on file prior to visit.    Past Medical History:  Diagnosis Date  . Anxiety   . Bipolar 1 disorder (HCC)    HX PSYCHOSIS  . Complication of anesthesia    POST AGGITATION  . Frequency of urination   . Headache(784.0)   . History of duodenal ulcer   . Hyperlipidemia   . IBS (irritable bowel syndrome)   .  Nocturia   . Pelvic pain   . Personality disorder ([place])    W/ BORDERLINE FEATURES  . Urgency of urination     Past Surgical History:  Procedure Laterality Date  . CARDIAC CATHETERIZATION  10-09-1999  DR KATZ   NORMAL LVF/  NORMAL RCA/  NO CRITICAL DISEASE LEFT CORONARY SYSTEM  . CARDIOVASCULAR STRESS TEST  01-23-2011   NORMAL NUCLEAR STUDY/ NO ISCHEMIA/ EF 81%  . CYSTOSCOPY WITH BIOPSY N/A 06/03/2013   Procedure: CYSTOSCOPY WITH BIOPSY  INSTILLATION OF MARCAINE AND PYRIDIUM;  Surgeon: Hanley Ben, MD;  Location: [facility];  Service: Urology;  Laterality: N/A;  . LAPAROSCOPIC CHOLECYSTECTOMY  11-08-2001  . LAPAROSCOPIC LEFT SALPINGOOPHORECTOMY AND LYSYS ADHESIONS  03-10-2002  . LAPAROSCOPIC REMOVAL OVARY REMNANT  2003   [place]  . LAPAROSCOPY N/A 12/14/2012   Procedure: LAPAROSCOPY DIAGNOSTIC;  Surgeon: Stark Klein, MD;  [facility];  Service: General;  Laterality: N/A;  . TOTAL ABDOMINAL HYSTERECTOMY  2000   W/ RIGHT SALPINGOOPHORECTOMY  . TRANSTHORACIC ECHOCARDIOGRAM  10-13-2010   NORMAL LVF/  EF 55-60%    Allergies  Allergen Reactions  . Morphine And Related Nausea And Vomiting and Nausea Only  . Duloxetine     Other reaction(s): SWELLING/EDEMA  . Latuda [Lurasidone Hcl]   . Morphine Nausea Only    Family History  Problem Relation Age of Onset  . Sleep apnea  Father   . Depression Father   . Alcohol abuse Father   . Hypertension Father   . Migraines Sister        headaches  . Anxiety disorder Sister   . Other Mother        MAC infection  . Anxiety disorder Mother   . Dementia Mother   . Heart disease Paternal Grandmother   . Hyperlipidemia Paternal Grandmother   . Alcohol abuse Paternal Grandfather   . Diabetes Neg Hx     Social History   Socioeconomic History  . Marital status: Married    Spouse name: Not on file  . Number of children: 0  . Years of education: college  . Highest education level: Not on file  Occupational History  . 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 Occupation: disability  Tobacco Use  . Smoking status: Former Smoker    Packs/day: 0.01    Years: 1.00    Pack years: 0.01    Types: Cigarettes    Quit date: 1980    Years since quitting: 41.3  . Smokeless tobacco: Never Used  . Tobacco comment: ONLY SMOKED FOR 6 MONTHS --  QUIT  YRS AGO  Substance and Sexual Activity  . Alcohol use: Yes    Comment: occ  . Drug use: No  . Sexual activity: Not on file  Other Topics Concern  . 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    no tobacco   Caffeine Use: very little, three times a week   Social Determinants of Health   Financial Resource Strain:   . Difficulty of Paying Living Expenses:   Food Insecurity:   . Worried About Charity fundraiser in the Last Year:   . Arboriculturist in the Last Year:   Transportation Needs:   . Film/video editor (Medical):   [name] Kitchen Lack of Transportation (  Non-Medical):   Physical Activity:   . Days of Exercise per Week:   . Minutes of Exercise per Session:   Stress:   . Feeling of Stress :   Social Connections:   . Frequency of Communication with Friends and Family:   . Frequency of Social Gatherings with  Friends and Family:   . Attends Religious Services:   . Active Member of Clubs or Organizations:   . Attends Archivist Meetings:   [name] Kitchen Marital Status:     Vitals:   11/28/19 1427  BP: 120/80  Pulse: (!) 56  Resp: 12  Temp: 98.1 F (36.7 C)  SpO2: 96%   Body mass index is 29.73 kg/m.  Wt Readings from Last 3 Encounters:  11/28/19 152 lb 4 oz (69.1 kg)  09/05/19 146 lb (66.2 kg)  10/08/18 154 lb (69.9 kg)    Physical Exam  Nursing note and vitals reviewed. Constitutional: She is oriented to person, place, and time. She appears well-developed. No distress.  HENT:  Head: Normocephalic and atraumatic.  Mouth/Throat: Oropharynx is clear and moist and mucous membranes are normal.  Eyes: Pupils are equal, round, and reactive to light. Conjunctivae are normal.  Cardiovascular: Regular rhythm. Bradycardia present.  No murmur heard. Pulses:      Dorsalis pedis pulses are 2+ on the right side and 2+ on the left side.  Respiratory: Effort normal and breath sounds normal. No respiratory distress.  GI: Soft. She exhibits no mass. There is no hepatomegaly. There is no abdominal tenderness.  Musculoskeletal:        General: No edema.  Lymphadenopathy:    She has no cervical adenopathy.  Neurological: She is alert and oriented to person, place, and time. She has normal strength. No cranial nerve deficit. Gait normal.  Skin: Skin is warm. No rash noted. No erythema.  Psychiatric: She has a normal mood and affect.  Well groomed, good eye contact.    ASSESSMENT AND PLAN:  [name] was here today AWV and follow up.  Orders Placed This Encounter  Procedures  . Comprehensive metabolic panel  . VITAMIN D 25 Hydroxy (Vit-D Deficiency, Fractures)  . Lipid panel  . Hemoglobin A1c  . Prolactin  . LDL cholesterol, direct    Lab Results  Component Value Date   CHOL 193 11/28/2019   HDL 43.40 11/28/2019   LDLCALC 54 05/15/2017   LDLDIRECT 107.0 11/28/2019    TRIG 334.0 (H) 11/28/2019   CHOLHDL 4 11/28/2019   Lab Results  Component Value Date   ALT 44 (H) 11/28/2019   AST 23 11/28/2019   ALKPHOS 91 11/28/2019   BILITOT 0.8 11/28/2019   Lab Results  Component Value Date   HGBA1C 5.5 11/28/2019   Lab Results  Component Value Date   CREATININE 0.83 11/28/2019   BUN 12 11/28/2019   NA 139 11/28/2019   K 4.0 11/28/2019   CL 109 11/28/2019   CO2 22 11/28/2019  The 10-year ASCVD risk score Mikey Bussing DC Jr., et al., 2013) is: 2.5%   Values used to calculate the score:     Age: 69 years     Sex: Female     Is Non-Hispanic African American: No     Diabetic: No     Tobacco smoker: No     Systolic Blood Pressure: 410 mmHg     Is BP treated: No     HDL Cholesterol: 43.4 mg/dL     Total Cholesterol: 193 mg/dL  Medicare annual wellness visit, subsequent We discussed the importance of staying active, physically and mentally, as well as the benefits of a healthy/balnace diet. Low impact exercise that involve stretching and strengthing are ideal. Vaccines: She is due for Tdap, which may not be covered by her health insurance here in the office but rather at her pharmacy.Becasue she just got her 1st  COVID 19 vaccine. We discussed preventive screening for the next 5-10 years, summery of recommendations discussed and given in AVS: Fall prevention. Advance directives and end of life discussed, she has not updated POA in years and no living will. Health Care directive package given.  During examination today mild bradycardia,asymptomatic.   Vitamin D deficiency Recommendations in regard to vit D supplementation according to 25 [place] vit D results.  Hyperlipidemia, mixed Continue non pharmacologic treatment. Further instructions according to FLP results.  Hyperglycemia Healthy life style for primary prevention of diabetes.  Abnormal LFTs Mildly elevated ALT and total bili. Limit alcohol intake and low fat diet recommended.  Schizoaffective  disorder, bipolar type [facility]) Following with psychiatrist.  Hyperprolactinemia ([place]) Possible etiologies discussed. Some of her meds can be contributing factors.   Return in about 1 year (around 11/27/2020) for CPE.     G. [name], MD  [facility]. [place] office.

## 2019-11-28 NOTE — Patient Instructions (Addendum)
  [name] , Thank you for taking time to come for your Medicare Wellness Visit. I appreciate your ongoing commitment to your health goals. Please review the following plan we discussed and let me know if I can assist you in the future.   These are the goals we discussed: Goals   None     This is a list of the screening recommended for you and due dates:  Health Maintenance  Topic Date Due  . Tetanus Vaccine  06/14/2019  . Mammogram  09/19/2019  . Pap Smear  07/07/2021*  . Flu Shot  03/18/2020  . Colon Cancer Screening  10/11/2022  .  Hepatitis C: One time screening is recommended by Center for Disease Control  (CDC) for  adults born from [birth year] through 1964.   Completed  . HIV Screening  Completed  *Topic was postponed. The date shown is not the original due date.    A few tips:  -As we age balance is not as good as it was, so there is a higher risks for falls. Please remove small rugs and furniture that is "in your way" and could increase the risk of falls. Stretching exercises may help with fall prevention: Yoga and Tai Chi are some examples. Low impact exercise is better, so you are not very achy the next day.  -Sun screen and avoidance of direct sun light recommended. Caution with dehydration, if working outdoors be sure to drink enough fluids.  - Some medications are not safe as we age, increases the risk of side effects and can potentially interact with other medication you are also taken;  including some of over the counter medications. Be sure to let me know when you start a new medication even if it is a dietary/vitamin supplement.   -Healthy diet low in red meet/animal fat and sugar + regular physical activity is recommended.       Screening schedule for the next 5-10 years:  Colonoscopy  Glaucoma screening/eye exam every 1-2 years.  Mammogram for breast cancer screening annually.  Flu vaccine annually.  Diabetes screening   Fall prevention   Advance  directives:  Please see a lawyer and/or go to this website to help you with advanced directives and designating a health care power of attorney so that your wishes will be followed should you become too ill to make your own medical decisions.  [email or web address]

## 2019-11-29 ENCOUNTER — Encounter: Payer: Self-pay | Admitting: Family Medicine

## 2019-11-29 DIAGNOSIS — Z23 Encounter for immunization: Secondary | ICD-10-CM | POA: Diagnosis not present

## 2019-11-29 LAB — LDL CHOLESTEROL, DIRECT: Direct LDL: 107 mg/dL

## 2019-11-29 LAB — COMPREHENSIVE METABOLIC PANEL
ALT: 44 U/L — ABNORMAL HIGH (ref 0–35)
AST: 23 U/L (ref 0–37)
Albumin: 4.7 g/dL (ref 3.5–5.2)
Alkaline Phosphatase: 91 U/L (ref 39–117)
BUN: 12 mg/dL (ref 6–23)
CO2: 22 mEq/L (ref 19–32)
Calcium: 10.5 mg/dL (ref 8.4–10.5)
Chloride: 109 mEq/L (ref 96–112)
Creatinine, Ser: 0.83 mg/dL (ref 0.40–1.20)
GFR: 70.63 mL/min (ref >60.00)
Glucose, Bld: 169 mg/dL — ABNORMAL HIGH (ref 70–99)
Potassium: 4 mEq/L (ref 3.5–5.1)
Sodium: 139 mEq/L (ref 135–145)
Total Bilirubin: 0.8 mg/dL (ref 0.2–1.2)
Total Protein: 7.1 g/dL (ref 6.0–8.3)

## 2019-11-29 LAB — LIPID PANEL
Cholesterol: 193 mg/dL (ref 0–200)
HDL: 43.4 mg/dL (ref >39.00)
NonHDL: 149.5
Total CHOL/HDL Ratio: 4
Triglycerides: 334 mg/dL — ABNORMAL HIGH (ref 0.0–149.0)
VLDL: 66.8 mg/dL — ABNORMAL HIGH (ref 0.0–40.0)

## 2019-11-29 LAB — VITAMIN D 25 HYDROXY (VIT D DEFICIENCY, FRACTURES): VITD: 29.8 ng/mL — ABNORMAL LOW (ref 30.00–100.00)

## 2019-11-29 LAB — PROLACTIN: Prolactin: 5.2 ng/mL

## 2019-11-29 LAB — HEMOGLOBIN A1C: Hgb A1c MFr Bld: 5.5 % (ref 4.6–6.5)

## 2019-11-30 MED ORDER — ICOSAPENT ETHYL 1 G PO CAPS: 2.0000 g | ORAL_CAPSULE | Freq: Two times a day (BID) | ORAL | 4 refills | Status: DC

## 2019-12-02 ENCOUNTER — Encounter: Payer: Self-pay | Admitting: Family Medicine

## 2019-12-06 ENCOUNTER — Other Ambulatory Visit: Payer: Self-pay | Admitting: Family Medicine

## 2019-12-06 MED ORDER — FENOFIBRATE 145 MG PO TABS: 145.0000 mg | ORAL_TABLET | Freq: Every day | ORAL | 1 refills | Status: DC

## 2020-01-17 ENCOUNTER — Other Ambulatory Visit: Payer: Self-pay

## 2020-01-17 ENCOUNTER — Telehealth (INDEPENDENT_AMBULATORY_CARE_PROVIDER_SITE_OTHER): Payer: Medicare Other | Admitting: Family Medicine

## 2020-01-17 ENCOUNTER — Telehealth: Payer: Medicare Other | Admitting: Family Medicine

## 2020-01-17 ENCOUNTER — Telehealth: Payer: Self-pay | Admitting: Family Medicine

## 2020-01-17 DIAGNOSIS — K1379 Other lesions of oral mucosa: Secondary | ICD-10-CM | POA: Diagnosis not present

## 2020-01-17 MED ORDER — NYSTATIN 100000 UNIT/ML MT SUSP: 15.0000 mL | Freq: Three times a day (TID) | OROMUCOSAL | 0 refills | Status: DC

## 2020-01-17 NOTE — Progress Notes (Signed)
Patient ID: [name], female   DOB: 1960/12/02, 59 y.o.   MRN: [id number]  This visit type was conducted due to national recommendations for restrictions regarding the COVID-19 pandemic in an effort to limit this patient's exposure and mitigate transmission in our community.   Virtual Visit via Telephone Note  I connected with [name] on 01/17/20 at  4:00 PM EDT by telephone and verified that I am speaking with the correct person using two identifiers.   I discussed the limitations, risks, security and privacy concerns of performing an evaluation and management service by telephone and the availability of in person appointments. I also discussed with the patient that there may be a patient responsible charge related to this service. The patient expressed understanding and agreed to proceed.  Location patient: home Location provider: work or home office Participants present for the call: patient, provider Patient did not have a visit in the prior 7 days to address this/these issue(s).   History of Present Illness:   [name] Ree called with about 2-day history of some discomfort involving her tongue and mouth.  She has not had any recent change of toothpaste.  No new medications.  No recent antibiotics.  She thinks there may be some mild redness but no discrete ulcers.  She has not noted any whitish patches to suggest thrush.  She has Advair listed on her med list but she states he has not been taking that recently.  No skin rashes.  No chronic PPI use.  She is on lithium for history of schizoaffective disorder, bipolar type.  She states she had recent lithium level.  She is not aware of any recent thyroid functions.  She is not a vegetarian.  No other specific risk factors for B12 deficiency.  Took some oral Benadryl and states that that seemed to help her symptoms somewhat.  Denies any tongue or lip swelling  She has had both Covid vaccines.  No recent cough or any loss of  taste or smell.  Occasional headaches.  Past Medical History:  Diagnosis Date  . Anxiety   . Bipolar 1 disorder (HCC)    HX PSYCHOSIS  . Complication of anesthesia    POST AGGITATION  . Frequency of urination   . Headache(784.0)   . History of duodenal ulcer   . Hyperlipidemia   . IBS (irritable bowel syndrome)   . Nocturia   . Pelvic pain   . Personality disorder ([place])    W/ BORDERLINE FEATURES  . Urgency of urination    Past Surgical History:  Procedure Laterality Date  . CARDIAC CATHETERIZATION  10-09-1999  DR KATZ   NORMAL LVF/  NORMAL RCA/  NO CRITICAL DISEASE LEFT CORONARY SYSTEM  . CARDIOVASCULAR STRESS TEST  01-23-2011   NORMAL NUCLEAR STUDY/ NO ISCHEMIA/ EF 81%  . CYSTOSCOPY WITH BIOPSY N/A 06/03/2013   Procedure: CYSTOSCOPY WITH BIOPSY  INSTILLATION OF MARCAINE AND PYRIDIUM;  Surgeon: Hanley Ben, MD;  Location: [facility];  Service: Urology;  Laterality: N/A;  . LAPAROSCOPIC CHOLECYSTECTOMY  11-08-2001  . LAPAROSCOPIC LEFT SALPINGOOPHORECTOMY AND LYSYS ADHESIONS  03-10-2002  . LAPAROSCOPIC REMOVAL OVARY REMNANT  2003   [place]  . LAPAROSCOPY N/A 12/14/2012   Procedure: LAPAROSCOPY DIAGNOSTIC;  Surgeon: Stark Klein, MD;  [facility];  Service: General;  Laterality: N/A;  . TOTAL ABDOMINAL HYSTERECTOMY  2000   W/ RIGHT SALPINGOOPHORECTOMY  . TRANSTHORACIC ECHOCARDIOGRAM  10-13-2010   NORMAL LVF/  EF 55-60%  reports that she quit smoking about 41 years ago. Her smoking use included cigarettes. She has a 0.01 pack-year smoking history. She has never used smokeless tobacco. She reports current alcohol use. She reports that she does not use drugs. family history includes Alcohol abuse in her father and paternal grandfather; Anxiety disorder in her mother and sister; Dementia in her mother; Depression in her father; Heart disease in her paternal grandmother; Hyperlipidemia in her paternal grandmother; Hypertension in her father; Migraines in  her sister; Other in her mother; Sleep apnea in her father. Allergies  Allergen Reactions  . Morphine And Related Nausea And Vomiting and Nausea Only  . Duloxetine     Other reaction(s): SWELLING/EDEMA  . Latuda [Lurasidone Hcl]   . Morphine Nausea Only      Observations/Objective: Patient sounds cheerful and well on the phone. I do not appreciate any SOB. Speech and thought processing are grossly intact. Patient reported vitals:  Assessment and Plan:  Mouth pain.  Patient does not describe any likely thrush.  She does have Advair listed but has not been taking this and no other specific risk factors for thrush.  She does not describe aphthous ulcers.  No change of toothpaste.  No recent change in medications.  Etiology unclear  -We suggest that she try some liquid Benadryl to swish gargle and spit -Follow-up promptly if any whitish patches noted to suggest thrush or symptoms not improving over the next couple of days  Follow Up Instructions:  -As above   [id number] 5-10 [id number] 11-20 [id number] 21-30 I did not refer this patient for an OV in the next 24 hours for this/these issue(s).  I discussed the assessment and treatment plan with the patient. The patient was provided an opportunity to ask questions and all were answered. The patient agreed with the plan and demonstrated an understanding of the instructions.   The patient was advised to call back or seek an in-person evaluation if the symptoms worsen or if the condition fails to improve as anticipated.  I provided 17 minutes of non-face-to-face time during this encounter.   Carolann Littler, MD

## 2020-01-17 NOTE — Telephone Encounter (Signed)
Please advise/fyi

## 2020-01-17 NOTE — Telephone Encounter (Signed)
Medication sent in pt notified

## 2020-01-17 NOTE — Telephone Encounter (Signed)
Send in Nystatin suspension- use 15 ml swish, gargle, and spit three times daily.  240 ml.

## 2020-01-17 NOTE — Telephone Encounter (Signed)
pt had an appt with Burchette today and noticed she answered one of his questions wrong. Pt states that it looks like Thrush on her tongue.   Pt can be reached at [phone]

## 2020-01-23 DIAGNOSIS — F3131 Bipolar disorder, current episode depressed, mild: Secondary | ICD-10-CM | POA: Diagnosis not present

## 2020-02-27 ENCOUNTER — Ambulatory Visit: Payer: Medicare Other | Admitting: Family Medicine

## 2020-02-27 ENCOUNTER — Telehealth: Payer: Self-pay | Admitting: Family Medicine

## 2020-02-27 NOTE — Telephone Encounter (Signed)
disregard

## 2020-02-28 ENCOUNTER — Telehealth (INDEPENDENT_AMBULATORY_CARE_PROVIDER_SITE_OTHER): Payer: Medicare Other | Admitting: Family Medicine

## 2020-02-28 ENCOUNTER — Encounter: Payer: Self-pay | Admitting: Family Medicine

## 2020-02-28 VITALS — Ht 60.0 in

## 2020-02-28 DIAGNOSIS — J312 Chronic pharyngitis: Secondary | ICD-10-CM

## 2020-02-28 DIAGNOSIS — R112 Nausea with vomiting, unspecified: Secondary | ICD-10-CM | POA: Diagnosis not present

## 2020-02-28 DIAGNOSIS — K140 Glossitis: Secondary | ICD-10-CM

## 2020-02-28 DIAGNOSIS — R208 Other disturbances of skin sensation: Secondary | ICD-10-CM | POA: Diagnosis not present

## 2020-02-28 MED ORDER — NYSTATIN 100000 UNIT/ML MT SUSP: 5.0000 mL | Freq: Four times a day (QID) | OROMUCOSAL | 0 refills | Status: AC

## 2020-02-28 MED ORDER — TRIAMCINOLONE ACETONIDE 0.1 % MT PSTE: 1.0000 "application " | PASTE | Freq: Two times a day (BID) | OROMUCOSAL | 0 refills | Status: AC

## 2020-02-28 NOTE — Progress Notes (Signed)
Virtual Visit via Video Note I connected with [name] on 02/28/20 by a video enabled telemedicine application and verified that I am speaking with the correct person using two identifiers.  Location patient: home Location provider:work office Persons participating in the virtual visit: patient, provider  I discussed the limitations of evaluation and management by telemedicine and the availability of in person appointments. The patient expressed understanding and agreed to proceed.  HPI: [name] is a 59 yo female with hx of chronic fatigue, anxiety, bipolar disorder, and allergies complaining of left-sided sore throat for "several months" and hoarseness "at times." Her "whole" mouth burns for several months. Problem is exacerbated by eating certain foods.  Tender lesions on left side of tongue, which she attributes to repetitive trauma,biting tongue. No Hx of tobacco use. Pain is exacerbated by swallowing, it happens daily, "all day." She has an appt with dentist in a month. She has had Bx before at her ENT office.  Chills x2 days. She has not noted fever, oral lesions, unusual fatigue, dysphagia, cough, wheezing,SOB, or a skin rash. Negative for swollen glands, night sweats, or abnormal weight loss Generalized body aches for the past 4 to 5 days. Nausea and vomiting intermittently for a while, yesterday she felt nauseated, no vomiting, no abdominal pain, no changes in bowel habits.  She has not tried OTC treatments for sore/mouth throat.  No sick contacts or recent travel.  Recently evaluated for same problem , treated with nystatin x 6 days, improved.  ROS: See pertinent positives and negatives per HPI.  Past Medical History:  Diagnosis Date   Anxiety    Bipolar 1 disorder ([place])    HX PSYCHOSIS   Complication of anesthesia    POST AGGITATION   Frequency of urination    Headache(784.0)    History of duodenal ulcer    Hyperlipidemia    IBS (irritable bowel  syndrome)    Nocturia    Pelvic pain    Personality disorder (HCC)    W/ BORDERLINE FEATURES   Urgency of urination     Past Surgical History:  Procedure Laterality Date   CARDIAC CATHETERIZATION  10-09-1999  DR Ron Parker   NORMAL LVF/  NORMAL RCA/  NO CRITICAL DISEASE LEFT CORONARY SYSTEM   CARDIOVASCULAR STRESS TEST  01-23-2011   NORMAL NUCLEAR STUDY/ NO ISCHEMIA/ EF 81%   CYSTOSCOPY WITH BIOPSY N/A 06/03/2013   Procedure: CYSTOSCOPY WITH BIOPSY  INSTILLATION OF MARCAINE AND PYRIDIUM;  Surgeon: Hanley Ben, MD;  Location: [facility];  Service: Urology;  Laterality: N/A;   LAPAROSCOPIC CHOLECYSTECTOMY  11-08-2001   LAPAROSCOPIC LEFT SALPINGOOPHORECTOMY AND LYSYS ADHESIONS  03-10-2002   LAPAROSCOPIC REMOVAL OVARY REMNANT  2003   [place]   LAPAROSCOPY N/A 12/14/2012   Procedure: LAPAROSCOPY DIAGNOSTIC;  Surgeon: Stark Klein, MD;  Location: [facility];  Service: General;  Laterality: N/A;   TOTAL ABDOMINAL HYSTERECTOMY  2000   W/ RIGHT SALPINGOOPHORECTOMY   TRANSTHORACIC ECHOCARDIOGRAM  10-13-2010   NORMAL LVF/  EF 55-60%    Family History  Problem Relation Age of Onset   Sleep apnea Father    Depression Father    Alcohol abuse Father    Hypertension Father    Migraines Sister        headaches   Anxiety disorder Sister    Other Mother        MAC infection   Anxiety disorder Mother    Dementia Mother    Heart disease Paternal Grandmother  Hyperlipidemia Paternal Grandmother    Alcohol abuse Paternal Grandfather    Diabetes Neg Hx     Social History   Socioeconomic History   Marital status: Married    Spouse name: Not on file   Number of children: 0   Years of education: college   Highest education level: Not on file  Occupational History   Occupation: N/A    Comment: Nurse   Occupation: disability  Tobacco Use   Smoking status: Former Smoker    Packs/day: 0.01    Years: 1.00    Pack years: 0.01    Types:  Cigarettes    Quit date: 1980    Years since quitting: 41.5   Smokeless tobacco: Never Used   Tobacco comment: ONLY SMOKED FOR 6 MONTHS --  QUIT  YRS AGO  Vaping Use   Vaping Use: Never used  Substance and Sexual Activity   Alcohol use: Yes    Comment: occ   Drug use: No   Sexual activity: Not on file  Other Topics Concern   Not on file  Social History Narrative   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    no tobacco   Caffeine Use: very little, three times a week   Social Determinants of Health   Financial Resource Strain:    Difficulty of Paying Living Expenses:   Food Insecurity:    Worried About Charity fundraiser in the Last Year:    Arboriculturist in the Last Year:   Transportation Needs:    Film/video editor (Medical):    Lack of Transportation (Non-Medical):   Physical Activity:    Days of Exercise per Week:    Minutes of Exercise per Session:   Stress:    Feeling of Stress :   Social Connections:    Frequency of Communication with Friends and Family:    Frequency of Social Gatherings with Friends and Family:    Attends Religious Services:    Active Member of Clubs or Organizations:    Attends Music therapist:    Marital Status:   Intimate Partner Violence:    Fear of Current or Ex-Partner:    Emotionally Abused:    Physically Abused:    Sexually Abused:     Current Outpatient Medications:    buPROPion (WELLBUTRIN XL) 150 MG 24 hr tablet, TAKE 1 TABLET BY MOUTH IN THE MORNING FOR 4 DAYS AND THEN TAKE 2 IN THE MORNING FOR 5 DAYS AND THEN TAKE 3 IN THE MORNING, Disp: , Rfl:    Cholecalciferol (VITAMIN D) 50 MCG (2000 UT) tablet, Take by mouth., Disp: , Rfl:    fenofibrate (TRICOR) 145 MG tablet, Take 1 tablet (145 mg total) by mouth daily., Disp: 90 tablet, Rfl: 1   gabapentin  (NEURONTIN) 300 MG capsule, Take by mouth at bedtime. , Disp: , Rfl:    hydrOXYzine (ATARAX/VISTARIL) 25 MG tablet, Take by mouth., Disp: , Rfl:    lithium carbonate (LITHOBID) 300 MG CR tablet, Take 2 tablets (600 mg total) by mouth every evening. (Patient taking differently: Take 1 tablet by mouth in the morning and 2 tablets in the evening.), Disp: 60 tablet, Rfl: 0   LORazepam (ATIVAN) 0.5 MG tablet, Take 0.5 mg by mouth 2 (two) times daily. 57m at bedtime, Disp: , Rfl:    albuterol (PROVENTIL HFA;VENTOLIN  HFA) 108 (90 Base) MCG/ACT inhaler, Inhale into the lungs., Disp: , Rfl:    fluticasone (FLONASE) 50 MCG/ACT nasal spray, Place 2 sprays into both nostrils daily. (Patient not taking: Reported on 11/28/2019), Disp: 16 g, Rfl: 0   Fluticasone-Salmeterol (ADVAIR DISKUS) 250-50 MCG/DOSE AEPB, Inhale 1 puff into the lungs 2 (two) times daily. (Patient not taking: Reported on 11/28/2019), Disp: 60 each, Rfl: 3   nystatin (MYCOSTATIN) 100000 UNIT/ML suspension, Take 5 mLs (500,000 Units total) by mouth 4 (four) times daily for 21 days., Disp: 250 mL, Rfl: 0   promethazine (PHENERGAN) 25 MG tablet, Take 1 tablet (25 mg total) by mouth every 12 (twelve) hours as needed for up to 10 days for nausea or vomiting., Disp: 20 tablet, Rfl: 0   triamcinolone (KENALOG) 0.1 % paste, Use as directed 1 application in the mouth or throat 2 (two) times daily for 14 days., Disp: 28 g, Rfl: 0  EXAM:  VITALS per patient if applicable:Ht 5' (4.403 m)    BMI 29.73 kg/m   GENERAL: alert, oriented, appears well and in no acute distress  HEENT: atraumatic, conjunttiva clear, no obvious abnormalities on inspection of external nose and ears I do not appreciate tongue lesions. Throat with no erythema,edema,or exudate.  NECK: normal movements of the head and neck  LUNGS: on inspection no signs of respiratory distress, breathing rate appears normal, no obvious gross SOB, gasping or wheezing  CV: no obvious  cyanosis  [name]: moves all visible extremities without noticeable abnormality  PSYCH/NEURO: pleasant and cooperative, no obvious depression or anxiety, speech and thought processing grossly intact  ASSESSMENT AND PLAN:  Discussed the following assessment and plan:  Burning sensation of mouth - Plan: nystatin (MYCOSTATIN) 100000 UNIT/ML suspension We discussed possible etiologies, problem is chronic. Nystatin helped, so will repeat treatment x 3 weeks. ENT referral placed.  Sore throat, chronic - Plan: Ambulatory referral to ENT ? Allergic. ENT evaluation will be arranged.  Tongue ulcer - Plan: triamcinolone (KENALOG) 0.1 % paste Repetitive trauma. Topical steroid paste recommended daily as needed. Keep appt with her dentist.  Non-intractable vomiting with nausea, unspecified vomiting type These symptoms do not seem to be associated with oral ones. In 07/2019 she was treated for same problem as well as myalgias. Could be related to IBS and/or peptic disease. ? GERD. 09/2012 EGD with mild gastritis. Will hold on GI evaluation for now and will plan on re-evaluating problem after ENT evaluations.  I discussed the assessment and treatment plan with the patient. [name] was provided an opportunity to ask questions and all were answered. She agreed with the plan and demonstrated an understanding of the instructions.   Return in about 8 weeks (around 04/24/2020) for Nausea and voiting..    [name] Weinrich [name], MD

## 2020-03-02 ENCOUNTER — Encounter: Payer: Self-pay | Admitting: Family Medicine

## 2020-03-20 ENCOUNTER — Other Ambulatory Visit: Payer: Self-pay

## 2020-03-20 ENCOUNTER — Encounter (INDEPENDENT_AMBULATORY_CARE_PROVIDER_SITE_OTHER): Payer: Self-pay | Admitting: Otolaryngology

## 2020-03-20 ENCOUNTER — Ambulatory Visit (INDEPENDENT_AMBULATORY_CARE_PROVIDER_SITE_OTHER): Payer: Medicare Other | Admitting: Otolaryngology

## 2020-03-20 VITALS — Temp 97.3°F

## 2020-03-20 DIAGNOSIS — K219 Gastro-esophageal reflux disease without esophagitis: Secondary | ICD-10-CM | POA: Diagnosis not present

## 2020-03-20 DIAGNOSIS — E041 Nontoxic single thyroid nodule: Secondary | ICD-10-CM | POA: Diagnosis not present

## 2020-03-20 DIAGNOSIS — M26609 Unspecified temporomandibular joint disorder, unspecified side: Secondary | ICD-10-CM | POA: Diagnosis not present

## 2020-03-20 NOTE — Progress Notes (Signed)
HPI: [name] is a 59 y.o. female who presents is referred by Dr. [name] for evaluation of intermittent right sided throat discomfort when she swallows.  This comes and goes.  She points to an area low on the right side of her neck.  She also has some trouble on the left side in her jaw when she chews.  Denies standard sore throat. She has previously seen Dr. Erik Obey and had fine-needle aspirate of a right thyroid nodule performed in 2016 that was benign.  She had a follow-up ultrasound of the thyroid nodule performed in 2018 that showed mild enlargement from 1.8 to 2.7 cm in size located along the inferior aspect of the right thyroid lobe.  This is not tender to palpation..  Past Medical History:  Diagnosis Date  . Anxiety   . Bipolar 1 disorder (HCC)    HX PSYCHOSIS  . Complication of anesthesia    POST AGGITATION  . Frequency of urination   . Headache(784.0)   . History of duodenal ulcer   . Hyperlipidemia   . IBS (irritable bowel syndrome)   . Nocturia   . Pelvic pain   . Personality disorder ([place])    W/ BORDERLINE FEATURES  . Urgency of urination    Past Surgical History:  Procedure Laterality Date  . CARDIAC CATHETERIZATION  10-09-1999  DR KATZ   NORMAL LVF/  NORMAL RCA/  NO CRITICAL DISEASE LEFT CORONARY SYSTEM  . CARDIOVASCULAR STRESS TEST  01-23-2011   NORMAL NUCLEAR STUDY/ NO ISCHEMIA/ EF 81%  . CYSTOSCOPY WITH BIOPSY N/A 06/03/2013   Procedure: CYSTOSCOPY WITH BIOPSY  INSTILLATION OF MARCAINE AND PYRIDIUM;  Surgeon: Hanley Ben, MD;  Location: [facility];  Service: Urology;  Laterality: N/A;  . LAPAROSCOPIC CHOLECYSTECTOMY  11-08-2001  . LAPAROSCOPIC LEFT SALPINGOOPHORECTOMY AND LYSYS ADHESIONS  03-10-2002  . LAPAROSCOPIC REMOVAL OVARY REMNANT  2003   [place]  . LAPAROSCOPY N/A 12/14/2012   Procedure: LAPAROSCOPY DIAGNOSTIC;  Surgeon: Stark Klein, MD;  [facility];  Service: General;  Laterality: N/A;  . TOTAL ABDOMINAL HYSTERECTOMY   2000   W/ RIGHT SALPINGOOPHORECTOMY  . TRANSTHORACIC ECHOCARDIOGRAM  10-13-2010   NORMAL LVF/  EF 55-60%   Social History   Socioeconomic History  . Marital status: Married    Spouse name: Not on file  . Number of children: 0  . Years of education: college  . Highest education level: Not on file  Occupational History  . 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 Occupation: disability  Tobacco Use  . Smoking status: Former Smoker    Packs/day: 0.01    Years: 1.00    Pack years: 0.01    Types: Cigarettes    Quit date: 1980    Years since quitting: 41.6  . Smokeless tobacco: Never Used  . Tobacco comment: ONLY SMOKED FOR 6 MONTHS --  QUIT  YRS AGO  Vaping Use  . Vaping Use: Never used  Substance and Sexual Activity  . Alcohol use: Yes    Comment: occ  . Drug use: No  . Sexual activity: Not on file  Other Topics Concern  . 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    no tobacco   Caffeine Use: very little, three times a week   Social Determinants of Health  Financial Resource Strain:   . Difficulty of Paying Living Expenses:   Food Insecurity:   . Worried About Charity fundraiser in the Last Year:   . Arboriculturist in the Last Year:   Transportation Needs:   . Film/video editor (Medical):   [name] Kitchen Lack of Transportation (Non-Medical):   Physical Activity:   . Days of Exercise per Week:   . Minutes of Exercise per Session:   Stress:   . Feeling of Stress :   Social Connections:   . Frequency of Communication with Friends and Family:   . Frequency of Social Gatherings with Friends and Family:   . Attends Religious Services:   . Active Member of Clubs or Organizations:   . Attends Archivist Meetings:   [name] Kitchen Marital Status:    Family History  Problem Relation Age of Onset  . Sleep apnea  Father   . Depression Father   . Alcohol abuse Father   . Hypertension Father   . Migraines Sister        headaches  . Anxiety disorder Sister   . Other Mother        MAC infection  . Anxiety disorder Mother   . Dementia Mother   . Heart disease Paternal Grandmother   . Hyperlipidemia Paternal Grandmother   . Alcohol abuse Paternal Grandfather   . Diabetes Neg Hx    Allergies  Allergen Reactions  . Morphine And Related Nausea And Vomiting and Nausea Only  . Duloxetine     Other reaction(s): SWELLING/EDEMA  . Latuda [Lurasidone Hcl]   . Morphine Nausea Only   Prior to Admission medications   Medication Sig Start Date End Date Taking? Authorizing Provider  buPROPion (WELLBUTRIN XL) 150 MG 24 hr tablet TAKE 1 TABLET BY MOUTH IN THE MORNING FOR 4 DAYS AND THEN TAKE 2 IN THE MORNING FOR 5 DAYS AND THEN TAKE 3 IN THE MORNING 11/03/19  Yes [provider]  Cholecalciferol (VITAMIN D) 50 MCG (2000 UT) tablet Take by mouth.   Yes [provider]  fenofibrate (TRICOR) 145 MG tablet Take 1 tablet (145 mg total) by mouth daily. 12/06/19  Yes [name], [name], MD  fluticasone [facility]) 50 MCG/ACT nasal spray Place 2 sprays into both nostrils daily. 08/06/19  Yes Brunetta Jeans, PA-C  Fluticasone-Salmeterol (ADVAIR DISKUS) 250-50 MCG/DOSE AEPB Inhale 1 puff into the lungs 2 (two) times daily. 08/02/19  Yes [name], [name], MD  gabapentin (NEURONTIN) 300 MG capsule Take by mouth at bedtime.    Yes [provider]  hydrOXYzine (ATARAX/VISTARIL) 25 MG tablet Take by mouth. 06/27/15  Yes [provider]  lithium carbonate (LITHOBID) 300 MG CR tablet Take 2 tablets (600 mg total) by mouth every evening. Patient taking differently: Take 1 tablet by mouth in the morning and 2 tablets in the evening. 06/27/15  Yes Withrow, Elyse Jarvis, FNP  LORazepam (ATIVAN) 0.5 MG tablet Take 0.5 mg by mouth 2 (two) times daily. 61m at bedtime   Yes [provider]  nystatin  (MYCOSTATIN) 100000 UNIT/ML suspension Take 5 mLs (500,000 Units total) by mouth 4 (four) times daily for 21 days. 02/28/20 03/20/20 Yes JMartinique [name], MD  albuterol (PROVENTIL HFA;VENTOLIN HFA) 108 (90 Base) MCG/ACT inhaler Inhale into the lungs. 08/20/17 08/20/18  [provider]  promethazine (PHENERGAN) 25 MG tablet Take 1 tablet (25 mg total) by mouth every 12 (twelve) hours as needed for up to 10 days for nausea  or vomiting. 07/25/19 08/04/19  [name], [name], MD     Positive ROS: Otherwise negative  All other systems have been reviewed and were otherwise negative with the exception of those mentioned in the HPI and as above.  Physical Exam: Constitutional: Alert, well-appearing, no acute distress Ears: External ears without lesions or tenderness. Ear canals are clear bilaterally with intact, clear TMs.  Nasal: External nose without lesions. Clear nasal passages. Oral: Lips and gums without lesions. Tongue and palate mucosa without lesions. Posterior oropharynx clear.  Tonsil regions appear benign bilaterally.  On opening and closing her mouth she has obvious TMJ dysfunction with distorted l movement of the jaw. Fiberoptic laryngoscopy was performed to the right nostril.  The nasopharynx was clear.  The base of tongue vallecula and epiglottis were normal.  Vocal cords were clear bilaterally.  She had mild edema of the arytenoid mucosa but no mucosal lesions noted.  I was able to pass the fiberoptic laryngoscope through the upper esophageal sphincter without difficulty.  No obvious mucosal abnormalities noted but this was difficult to adequately visualize with the fiberoptic laryngoscope. Neck: No palpable adenopathy or supraclavicular adenopathy noted.  She did have fullness of the right thyroid lobe compared to the left. Respiratory: Breathing comfortably  Skin: No facial/neck lesions or rash noted.  Laryngoscopy  Date/Time: 03/20/2020 4:29 PM Performed by: Rozetta Nunnery,  MD Authorized by: Rozetta Nunnery, MD   Consent:    Consent obtained:  Verbal   Consent given by:  Patient Procedure details:    Indications: direct visualization of the upper aerodigestive tract     Medication:  Afrin   Instrument: flexible fiberoptic laryngoscope     Scope location: right nare   Sinus:    Right nasopharynx: normal   Mouth:    Oropharynx: normal     Vallecula: normal     Base of tongue: normal     Epiglottis: normal   Throat:    Pyriform sinus: normal     True vocal cords: normal   Comments:     On fiberoptic laryngoscopy patient had mild edema of the arytenoid mucosa.  I was able to pass the fiberoptic laryngoscope through the upper esophageal sphincter without difficulty.  She had mild edema but no mucosal lesions noted.  But visualization of this area is difficult with the palpatory discomfort.  Vocal cords and piriform sinuses were clear otherwise.    Assessment: Findings consistent with probable laryngeal pharyngeal reflux contributing to intermittent sore throat. TMJ dysfunction. History of right thyroid nodule which has been previously biopsied and benign.  Plan: Prescribed omeprazole 40 mg daily before dinner for the next month to see if this helps at all with a sore throat. She is scheduled to see her dentist later this week and recommended his recommendations for TMJ dysfunction which would probably require appointment with oral surgeon. Patient will follow up as needed. I doubt the thyroid nodule is causing him discomfort but if this enlarges clinically may need repeat ultrasound.    Radene Journey, MD   CC:

## 2020-04-09 DIAGNOSIS — F3131 Bipolar disorder, current episode depressed, mild: Secondary | ICD-10-CM | POA: Diagnosis not present

## 2020-04-10 ENCOUNTER — Other Ambulatory Visit: Payer: Self-pay | Admitting: Nurse Practitioner

## 2020-04-13 ENCOUNTER — Encounter: Payer: Self-pay | Admitting: Family Medicine

## 2020-04-13 ENCOUNTER — Other Ambulatory Visit: Payer: Self-pay

## 2020-04-13 ENCOUNTER — Ambulatory Visit (INDEPENDENT_AMBULATORY_CARE_PROVIDER_SITE_OTHER): Payer: Medicare Other | Admitting: Family Medicine

## 2020-04-13 VITALS — BP 118/80 | HR 92 | Temp 98.8°F | Wt 148.2 lb

## 2020-04-13 DIAGNOSIS — R110 Nausea: Secondary | ICD-10-CM

## 2020-04-13 DIAGNOSIS — R42 Dizziness and giddiness: Secondary | ICD-10-CM

## 2020-04-13 DIAGNOSIS — R519 Headache, unspecified: Secondary | ICD-10-CM | POA: Diagnosis not present

## 2020-04-13 DIAGNOSIS — L282 Other prurigo: Secondary | ICD-10-CM

## 2020-04-13 DIAGNOSIS — Z79899 Other long term (current) drug therapy: Secondary | ICD-10-CM

## 2020-04-13 MED ORDER — ONDANSETRON HCL 4 MG PO TABS: 4.0000 mg | ORAL_TABLET | Freq: Three times a day (TID) | ORAL | 0 refills | Status: DC | PRN

## 2020-04-13 MED ORDER — DOXYCYCLINE HYCLATE 100 MG PO TABS: 100.0000 mg | ORAL_TABLET | Freq: Two times a day (BID) | ORAL | 0 refills | Status: DC

## 2020-04-13 MED ORDER — HYDROXYZINE HCL 25 MG PO TABS: 25.0000 mg | ORAL_TABLET | Freq: Three times a day (TID) | ORAL | 0 refills | Status: DC | PRN

## 2020-04-13 NOTE — Progress Notes (Signed)
Subjective:    Patient ID: [name], female    DOB: 02-17-1961, 58 y.o.   MRN: [id number]  No chief complaint on file. Patient accompanied by her wife.  HPI Pt is a 59 yo female with pmh sig for fatty liver disease, hyperprolactinemia, multinodular goiter, lithium use, schizoaffective DL, bipolar type, HLD, borderline personality disorder, chronic fatigue, by Dr. [name] who was seen for acute concern.  Pt endorses rash on right breast x3 days.  Area started out small like a bite then spread.  Pt endorses dizziness, nausea, headache.  Pt does not recall being bitten, seeing any ticks.  Pt has been out walking the dogs.  Applied OTC itch cream to area without relief.  Patient taking lithium daily.  States last lithium level was 4 months ago.  Past Medical History:  Diagnosis Date  . Anxiety   . Bipolar 1 disorder (HCC)    HX PSYCHOSIS  . Complication of anesthesia    POST AGGITATION  . Frequency of urination   . Headache(784.0)   . History of duodenal ulcer   . Hyperlipidemia   . IBS (irritable bowel syndrome)   . Nocturia   . Pelvic pain   . Personality disorder ([place])    W/ BORDERLINE FEATURES  . Urgency of urination     Allergies  Allergen Reactions  . Morphine And Related Nausea And Vomiting and Nausea Only  . Duloxetine     Other reaction(s): SWELLING/EDEMA  . Latuda [Lurasidone Hcl]   . Morphine Nausea Only    ROS General: Denies fever, chills, night sweats, changes in weight, changes in appetite  + dizziness HEENT: Denies ear pain, changes in vision, rhinorrhea, sore throat  +HA CV: Denies CP, palpitations, SOB, orthopnea Pulm: Denies SOB, cough, wheezing GI: Denies abdominal pain, nausea, vomiting, diarrhea, constipation GU: Denies dysuria, hematuria, frequency, vaginal discharge Msk: Denies muscle cramps, joint pains Neuro: Denies weakness, numbness, tingling Skin: Denies rashes, bruising  + rash on right breast Psych: Denies depression, anxiety,  hallucinations     Objective:    Blood pressure 118/80, pulse 92, temperature 98.8 F (37.1 C), temperature source Oral, weight 148 lb 3.2 oz (67.2 kg), SpO2 98 %.   Gen. Pleasant, well-nourished, in no distress, appears fatigued, flat affect HEENT: [place]/AT, face symmetric, conjunctiva clear, no scleral icterus, PERRLA, EOMI, nares patent without drainage Lungs: no accessory muscle use, CTAB, no wheezes or rales Cardiovascular: RRR, no m/r/g, no peripheral edema Musculoskeletal: No deformities, no cyanosis or clubbing, normal tone Neuro:  A&Ox3, CN II-XII intact, normal gait Skin:  Warm, dry, intact.  Right breast with 3 areas of pruritic, erythematous flat plaque.      Wt Readings from Last 3 Encounters:  11/28/19 152 lb 4 oz (69.1 kg)  09/05/19 146 lb (66.2 kg)  10/08/18 154 lb (69.9 kg)    Lab Results  Component Value Date   WBC 8.2 08/17/2018   HGB 13.2 08/17/2018   HCT 39.7 08/17/2018   PLT 243.0 08/17/2018   GLUCOSE 169 (H) 11/28/2019   CHOL 193 11/28/2019   TRIG 334.0 (H) 11/28/2019   HDL 43.40 11/28/2019   LDLDIRECT 107.0 11/28/2019   LDLCALC 54 05/15/2017   ALT 44 (H) 11/28/2019   AST 23 11/28/2019   NA 139 11/28/2019   K 4.0 11/28/2019   CL 109 11/28/2019   CREATININE 0.83 11/28/2019   BUN 12 11/28/2019   CO2 22 11/28/2019   TSH 3.07 10/14/2017   INR 1.0 02/13/2014   HGBA1C  5.5 11/28/2019    Assessment/Plan:  Pruritic rash  -Unclear if initially caused by an insect bite.  Given possibility of tick bite/tick borne illness discussed starting treatment with doxycycline. -Given precautions. - Plan: doxycycline (VIBRA-TABS) 100 MG tablet, CBC with Differential/Platelet, hydrOXYzine (ATARAX/VISTARIL) 25 MG tablet, CBC with Differential/Platelet  Nausea  - Plan: ondansetron (ZOFRAN) 4 MG tablet  Dizziness -Discussed possible causes including dehydration, medications, viral illness, also consider tickborne illness. -Discussed supportive care. -Given  precautions  Acute nonintractable headache, unspecified headache type -Discussed supportive care including increasing p.o. intake of water and fluids, decreasing caffeine intake, getting plenty of rest, right psoas decreasing stress. -Tylenol as needed  Lithium use  - Plan: Lithium level, Lithium level  F/u prn  Grier Mitts, MD

## 2020-04-13 NOTE — Patient Instructions (Signed)
Rash, Adult A rash is a change in the color of your skin. A rash can also change the way your skin feels. There are many different conditions and factors that can cause a rash. Some rashes may disappear after a few days, but some may last for a few weeks. Common causes of rashes include:  Viral infections, such as: ? Colds. ? Measles. ? Hand, foot, and mouth disease.  Bacterial infections, such as: ? Scarlet fever. ? Impetigo.  Fungal infections, such as Candida.  Allergic reactions to food, medicines, or skin care products. Follow these instructions at home: The goal of treatment is to stop the itching and keep the rash from spreading. Pay attention to any changes in your symptoms. Follow these instructions to help with your condition: Medicine Take or apply over-the-counter and prescription medicines only as told by your health care provider. These may include:  Corticosteroid creams to treat red or swollen skin.  Anti-itch lotions.  Oral allergy medicines (antihistamines).  Oral corticosteroids for severe symptoms.  Skin care  Apply cool compresses to the affected areas.  [place] not scratch or rub your skin.  Avoid covering the rash. Make sure the rash is exposed to air as much as possible. Managing itching and discomfort  Avoid hot showers or baths, which can make itching worse. A cold shower may help.  Try taking a bath with: ? Epsom salts. Follow manufacturer instructions on the packaging. You can get these at your local pharmacy or grocery store. ? Baking soda. Pour a small amount into the bath as told by your health care provider. ? Colloidal oatmeal. Follow manufacturer instructions on the packaging. You can get this at your local pharmacy or grocery store.  Try applying baking soda paste to your skin. Stir water into baking soda until it reaches a paste-like consistency.  Try applying calamine lotion. This is an over-the-counter lotion that helps to relieve  itchiness.  Keep cool and out of the sun. Sweating and being hot can make itching worse. General instructions   Rest as needed.  Drink enough fluid to keep your urine pale yellow.  Wear loose-fitting clothing.  Avoid scented soaps, detergents, and perfumes. Use gentle soaps, detergents, perfumes, and other cosmetic products.  Avoid any substance that causes your rash. Keep a journal to help track what causes your rash. Write down: ? What you eat. ? What cosmetic products you use. ? What you drink. ? What you wear. This includes jewelry.  Keep all follow-up visits as told by your health care provider. This is important. Contact a health care provider if:  You sweat at night.  You lose weight.  You urinate more than normal.  You urinate less than normal, or you notice that your urine is a darker color than usual.  You feel weak.  You vomit.  Your skin or the whites of your eyes look yellow (jaundice).  Your skin: ? Tingles. ? Is numb.  Your rash: ? Does not go away after several days. ? Gets worse.  You are: ? Unusually thirsty. ? More tired than normal.  You have: ? New symptoms. ? Pain in your abdomen. ? A fever. ? Diarrhea. Get help right away if you:  Have a fever and your symptoms suddenly get worse.  Develop confusion.  Have a severe headache or a stiff neck.  Have severe joint pains or stiffness.  Have a seizure.  Develop a rash that covers all or most of your body. The rash may   or may not be painful.  Develop blisters that: ? Are on top of the rash. ? Grow larger or grow together. ? Are painful. ? Are inside your nose or mouth.  Develop a rash that: ? Looks like purple pinprick-sized spots all over your body. ? Has a "bull's eye" or looks like a target. ? Is not related to sun exposure, is red and painful, and causes your skin to peel. Summary  A rash is a change in the color of your skin. Some rashes disappear after a few days,  but some may last for a few weeks.  The goal of treatment is to stop the itching and keep the rash from spreading.  Take or apply over-the-counter and prescription medicines only as told by your health care provider.  Contact a health care provider if you have new or worsening symptoms.  Keep all follow-up visits as told by your health care provider. This is important. This information is not intended to replace advice given to you by your health care provider. Make sure you discuss any questions you have with your health care provider. Document Revised: 11/26/2018 Document Reviewed: 03/08/2018 Elsevier Patient Education  [place].  Nausea, Adult Nausea is the feeling that you have an upset stomach or that you are about to vomit. Nausea on its own is not usually a serious concern, but it may be an early sign of a more serious medical problem. As nausea gets worse, it can lead to vomiting. If vomiting develops, or if you are not able to drink enough fluids, you are at risk of becoming dehydrated. Dehydration can make you tired and thirsty, cause you to have a dry mouth, and decrease how often you urinate. Older adults and people with other diseases or a weak disease-fighting system (immune system) are at higher risk for dehydration. The main goals of treating your nausea are:  To relieve your nausea.  To limit repeated nausea episodes.  To prevent vomiting and dehydration. Follow these instructions at home: Watch your symptoms for any changes. Tell your health care provider about them. Follow these instructions as told by your health care provider. Eating and drinking      Take an oral rehydration solution (ORS). This is a drink that is sold at pharmacies and retail stores.  Drink clear fluids slowly and in small amounts as you are able. Clear fluids include water, ice chips, low-calorie sports drinks, and fruit juice that has water added (diluted fruit juice).  Eat bland,  easy-to-digest foods in small amounts as you are able. These foods include bananas, applesauce, rice, lean meats, toast, and crackers.  Avoid drinking fluids that contain a lot of sugar or caffeine, such as energy drinks, sports drinks, and soda.  Avoid alcohol.  Avoid spicy or fatty foods. General instructions  Take over-the-counter and prescription medicines only as told by your health care provider.  Rest at home while you recover.  Drink enough fluid to keep your urine pale yellow.  Breathe slowly and deeply when you feel nauseous.  Avoid smelling things that have strong odors.  Wash your hands often using soap and water. If soap and water are not available, use hand sanitizer.  Make sure that all people in your household wash their hands well and often.  Keep all follow-up visits as told by your health care provider. This is important. Contact a health care provider if:  Your nausea gets worse.  Your nausea does not go away after two  days.  You vomit.  You cannot drink fluids without vomiting.  You have any of the following: ? New symptoms. ? A fever. ? A headache. ? Muscle cramps. ? A rash. ? Pain while urinating.  You feel light-headed or dizzy. Get help right away if:  You have pain in your chest, neck, arm, or jaw.  You feel extremely weak or you faint.  You have vomit that is bright red or looks like coffee grounds.  You have bloody or black stools or stools that look like tar.  You have a severe headache, a stiff neck, or both.  You have severe pain, cramping, or bloating in your abdomen.  You have difficulty breathing or are breathing very quickly.  Your heart is beating very quickly.  Your skin feels cold and clammy.  You feel confused.  You have signs of dehydration, such as: ? Dark urine, very little urine, or no urine. ? Cracked lips. ? Dry mouth. ? Sunken eyes. ? Sleepiness. ? Weakness. These symptoms may represent a serious  problem that is an emergency. Do not wait to see if the symptoms will go away. Get medical help right away. Call your local emergency services (911 in the [place] Do not drive yourself to the hospital. Summary  Nausea is the feeling that you have an upset stomach or that you are about to vomit. Nausea on its own is not usually a serious concern, but it may be an early sign of a more serious medical problem.  If vomiting develops, or if you are not able to drink enough fluids, you are at risk of becoming dehydrated.  Follow recommendations for eating and drinking and take over-the-counter and prescription medicines only as told by your health care provider.  Contact a health care provider right away if your symptoms worsen or you have new symptoms.  Keep all follow-up visits as told by your health care provider. This is important. This information is not intended to replace advice given to you by your health care provider. Make sure you discuss any questions you have with your health care provider. Document Revised: 01/12/2018 Document Reviewed: 01/12/2018 Elsevier Patient Education  [place]. Dizziness Dizziness is a common problem. It is a feeling of unsteadiness or light-headedness. You may feel like you are about to faint. Dizziness can lead to injury if you stumble or fall. Anyone can become dizzy, but dizziness is more common in older adults. This condition can be caused by a number of things, including medicines, dehydration, or illness. Follow these instructions at home: Eating and drinking  Drink enough fluid to keep your urine clear or pale yellow. This helps to keep you from becoming dehydrated. Try to drink more clear fluids, such as water.  Do not drink alcohol.  Limit your caffeine intake if told to do so by your health care provider. Check ingredients and nutrition facts to see if a food or beverage contains caffeine.  Limit your salt (sodium) intake if told to do so  by your health care provider. Check ingredients and nutrition facts to see if a food or beverage contains sodium. Activity  Avoid making quick movements. ? Rise slowly from chairs and steady yourself until you feel okay. ? In the morning, first sit up on the side of the bed. When you feel okay, stand slowly while you hold onto something until you know that your balance is fine.  If you need to stand in one place for a long time,  move your legs often. Tighten and relax the muscles in your legs while you are standing.  Do not drive or use heavy machinery if you feel dizzy.  Avoid bending down if you feel dizzy. Place items in your home so that they are easy for you to reach without leaning over. Lifestyle  Do not use any products that contain nicotine or tobacco, such as cigarettes and e-cigarettes. If you need help quitting, ask your health care provider.  Try to reduce your stress level by using methods such as yoga or meditation. Talk with your health care provider if you need help to manage your stress. General instructions  Watch your dizziness for any changes.  Take over-the-counter and prescription medicines only as told by your health care provider. Talk with your health care provider if you think that your dizziness is caused by a medicine that you are taking.  Tell a friend or a family member that you are feeling dizzy. If he or she notices any changes in your behavior, have this person call your health care provider.  Keep all follow-up visits as told by your health care provider. This is important. Contact a health care provider if:  Your dizziness does not go away.  Your dizziness or light-headedness gets worse.  You feel nauseous.  You have reduced hearing.  You have new symptoms.  You are unsteady on your feet or you feel like the room is spinning. Get help right away if:  You vomit or have diarrhea and are unable to eat or drink anything.  You have problems  talking, walking, swallowing, or using your arms, hands, or legs.  You feel generally weak.  You are not thinking clearly or you have trouble forming sentences. It may take a friend or family member to notice this.  You have chest pain, abdominal pain, shortness of breath, or sweating.  Your vision changes.  You have any bleeding.  You have a severe headache.  You have neck pain or a stiff neck.  You have a fever. These symptoms may represent a serious problem that is an emergency. Do not wait to see if the symptoms will go away. Get medical help right away. Call your local emergency services (911 in the [place] Do not drive yourself to the hospital. Summary  Dizziness is a feeling of unsteadiness or light-headedness. This condition can be caused by a number of things, including medicines, dehydration, or illness.  Anyone can become dizzy, but dizziness is more common in older adults.  Drink enough fluid to keep your urine clear or pale yellow. Do not drink alcohol.  Avoid making quick movements if you feel dizzy. Monitor your dizziness for any changes. This information is not intended to replace advice given to you by your health care provider. Make sure you discuss any questions you have with your health care provider. Document Revised: 08/07/2017 Document Reviewed: 09/06/2016 Elsevier Patient Education  2020 [employer].

## 2020-04-14 LAB — CBC WITH DIFFERENTIAL/PLATELET
Absolute Monocytes: 506 cells/uL (ref 200–950)
Basophils Absolute: 28 cells/uL (ref 0–200)
Basophils Relative: 0.5 %
Eosinophils Absolute: 281 cells/uL (ref 15–500)
Eosinophils Relative: 5.1 %
HCT: 41.6 % (ref 35.0–45.0)
Hemoglobin: 13.8 g/dL (ref 11.7–15.5)
Lymphs Abs: 1348 cells/uL (ref 850–3900)
MCH: 30.9 pg (ref 27.0–33.0)
MCHC: 33.2 g/dL (ref 32.0–36.0)
MCV: 93.1 fL (ref 80.0–100.0)
MPV: 8.7 fL (ref 7.5–12.5)
Monocytes Relative: 9.2 %
Neutro Abs: 3339 cells/uL (ref 1500–7800)
Neutrophils Relative %: 60.7 %
Platelets: 325 10*3/uL (ref 140–400)
RBC: 4.47 10*6/uL (ref 3.80–5.10)
RDW: 11.3 % (ref 11.0–15.0)
Total Lymphocyte: 24.5 %
WBC: 5.5 10*3/uL (ref 3.8–10.8)

## 2020-04-14 LAB — LITHIUM LEVEL: Lithium Lvl: 0.8 mmol/L (ref 0.6–1.2)

## 2020-04-19 ENCOUNTER — Encounter: Payer: Self-pay | Admitting: Family Medicine

## 2020-04-19 NOTE — Progress Notes (Signed)
Error

## 2020-05-21 DIAGNOSIS — F29 Unspecified psychosis not due to a substance or known physiological condition: Secondary | ICD-10-CM | POA: Diagnosis not present

## 2020-05-23 DIAGNOSIS — H43812 Vitreous degeneration, left eye: Secondary | ICD-10-CM | POA: Diagnosis not present

## 2020-06-07 ENCOUNTER — Telehealth: Payer: Self-pay | Admitting: Family Medicine

## 2020-06-07 NOTE — Telephone Encounter (Signed)
Pt is calling in stating that she is having a lot of vertigo and would like to see if she can get a refill of Rx ondansetron (ZOFRAN) 4 MG.  Pharm: [facility] [address], [place]

## 2020-06-08 NOTE — Telephone Encounter (Signed)
Okay to refill x1 

## 2020-06-12 ENCOUNTER — Other Ambulatory Visit: Payer: Self-pay | Admitting: Family Medicine

## 2020-06-12 DIAGNOSIS — R110 Nausea: Secondary | ICD-10-CM

## 2020-06-12 MED ORDER — ONDANSETRON HCL 4 MG PO TABS: 4.0000 mg | ORAL_TABLET | Freq: Every day | ORAL | 0 refills | Status: DC | PRN

## 2020-06-12 NOTE — Telephone Encounter (Signed)
Prescription for Zofran 4 mg was sent to her pharmacy today once daily as needed. If problem is persistent, she needs to arrange follow-up appointment. Thanks, BJ

## 2020-06-15 ENCOUNTER — Emergency Department (HOSPITAL_BASED_OUTPATIENT_CLINIC_OR_DEPARTMENT_OTHER)
Admission: EM | Admit: 2020-06-15 | Discharge: 2020-06-15 | Disposition: A | Discharge status: Home / Self Care | Payer: Medicare Other | Attending: Emergency Medicine | Admitting: Emergency Medicine

## 2020-06-15 ENCOUNTER — Ambulatory Visit: Payer: Medicare Other | Admitting: Family Medicine

## 2020-06-15 ENCOUNTER — Other Ambulatory Visit: Payer: Self-pay

## 2020-06-15 ENCOUNTER — Emergency Department (HOSPITAL_BASED_OUTPATIENT_CLINIC_OR_DEPARTMENT_OTHER): Payer: Medicare Other

## 2020-06-15 ENCOUNTER — Encounter (HOSPITAL_BASED_OUTPATIENT_CLINIC_OR_DEPARTMENT_OTHER): Payer: Self-pay | Admitting: *Deleted

## 2020-06-15 DIAGNOSIS — Y9301 Activity, walking, marching and hiking: Secondary | ICD-10-CM | POA: Insufficient documentation

## 2020-06-15 DIAGNOSIS — Z79899 Other long term (current) drug therapy: Secondary | ICD-10-CM | POA: Insufficient documentation

## 2020-06-15 DIAGNOSIS — I6782 Cerebral ischemia: Secondary | ICD-10-CM | POA: Diagnosis not present

## 2020-06-15 DIAGNOSIS — W19XXXA Unspecified fall, initial encounter: Secondary | ICD-10-CM

## 2020-06-15 DIAGNOSIS — G9389 Other specified disorders of brain: Secondary | ICD-10-CM | POA: Diagnosis not present

## 2020-06-15 DIAGNOSIS — R42 Dizziness and giddiness: Secondary | ICD-10-CM | POA: Diagnosis not present

## 2020-06-15 DIAGNOSIS — S0990XA Unspecified injury of head, initial encounter: Secondary | ICD-10-CM | POA: Insufficient documentation

## 2020-06-15 DIAGNOSIS — W01198A Fall on same level from slipping, tripping and stumbling with subsequent striking against other object, initial encounter: Secondary | ICD-10-CM | POA: Insufficient documentation

## 2020-06-15 DIAGNOSIS — Z87891 Personal history of nicotine dependence: Secondary | ICD-10-CM | POA: Diagnosis not present

## 2020-06-15 LAB — URINALYSIS, ROUTINE W REFLEX MICROSCOPIC
Bilirubin Urine: NEGATIVE
Glucose, UA: NEGATIVE mg/dL
Hgb urine dipstick: NEGATIVE
Ketones, ur: NEGATIVE mg/dL
Leukocytes,Ua: NEGATIVE
Nitrite: NEGATIVE
Protein, ur: NEGATIVE mg/dL
Specific Gravity, Urine: 1.015 (ref 1.005–1.030)
pH: 7 (ref 5.0–8.0)

## 2020-06-15 LAB — CBC WITH DIFFERENTIAL/PLATELET
Abs Immature Granulocytes: 0.02 10*3/uL (ref 0.00–0.07)
Basophils Absolute: 0.1 10*3/uL (ref 0.0–0.1)
Basophils Relative: 1 %
Eosinophils Absolute: 0.3 10*3/uL (ref 0.0–0.5)
Eosinophils Relative: 5 %
HCT: 43.5 % (ref 36.0–46.0)
Hemoglobin: 14.1 g/dL (ref 12.0–15.0)
Immature Granulocytes: 0 %
Lymphocytes Relative: 23 %
Lymphs Abs: 1.6 10*3/uL (ref 0.7–4.0)
MCH: 30.5 pg (ref 26.0–34.0)
MCHC: 32.4 g/dL (ref 30.0–36.0)
MCV: 94.2 fL (ref 80.0–100.0)
Monocytes Absolute: 0.7 10*3/uL (ref 0.1–1.0)
Monocytes Relative: 11 %
Neutro Abs: 4 10*3/uL (ref 1.7–7.7)
Neutrophils Relative %: 60 %
Platelets: 348 10*3/uL (ref 150–400)
RBC: 4.62 MIL/uL (ref 3.87–5.11)
RDW: 12 % (ref 11.5–15.5)
WBC: 6.7 10*3/uL (ref 4.0–10.5)
nRBC: 0 % (ref 0.0–0.2)

## 2020-06-15 LAB — COMPREHENSIVE METABOLIC PANEL
ALT: 43 U/L (ref 0–44)
AST: 28 U/L (ref 15–41)
Albumin: 4.4 g/dL (ref 3.5–5.0)
Alkaline Phosphatase: 82 U/L (ref 38–126)
Anion gap: 12 (ref 5–15)
BUN: 13 mg/dL (ref 6–20)
CO2: 23 mmol/L (ref 22–32)
Calcium: 9.9 mg/dL (ref 8.9–10.3)
Chloride: 102 mmol/L (ref 98–111)
Creatinine, Ser: 0.91 mg/dL (ref 0.44–1.00)
GFR, Estimated: >60 mL/min (ref >60)
Glucose, Bld: 120 mg/dL — ABNORMAL HIGH (ref 70–99)
Potassium: 3.9 mmol/L (ref 3.5–5.1)
Sodium: 137 mmol/L (ref 135–145)
Total Bilirubin: 1.2 mg/dL (ref 0.3–1.2)
Total Protein: 7.3 g/dL (ref 6.5–8.1)

## 2020-06-15 LAB — LITHIUM LEVEL: Lithium Lvl: 0.65 mmol/L (ref 0.60–1.20)

## 2020-06-15 MED ORDER — MECLIZINE HCL 25 MG PO TABS: 25.0000 mg | ORAL_TABLET | Freq: Three times a day (TID) | ORAL | 0 refills | Status: DC | PRN

## 2020-06-15 MED ORDER — SODIUM CHLORIDE 0.9 % IV BOLUS
500.0000 mL | Freq: Once | INTRAVENOUS | Status: AC
  Administered 2020-06-15: 500 mL via INTRAVENOUS

## 2020-06-15 MED ORDER — KETOROLAC TROMETHAMINE 30 MG/ML IJ SOLN
30.0000 mg | Freq: Once | INTRAMUSCULAR | Status: AC
  Administered 2020-06-15: 30 mg via INTRAVENOUS
  Filled 2020-06-15: qty 1

## 2020-06-15 NOTE — ED Notes (Signed)
Pt. Has had a fall in the past 2 days with injury to the R arm and no noted bruising.  Pt. Also had a fall a week or so ago causing injury to the Abd. With noted purple and red bruising with yellow surrounding to the bruising and noted injury to the forehead.  Pt. Is slow to speak and her partner reports the Pt. Has been very weak.  Pt. Pupils are 3 R and L with slow reaction to light.

## 2020-06-15 NOTE — ED Triage Notes (Signed)
She had an appointment to see Dr Sarajane Jews today for head pain and falling x 2. She didn't feel like she could make it to the doctors office. She is alert oriented. She has fallen x 2 in the past week due to weakness in her legs. She hit her forehead and abdomen during the first fall and has had head pain since the fall. She had an injury to her right shoulder and right hip during the second fall. She does not take a blood thinner.

## 2020-06-15 NOTE — ED Provider Notes (Signed)
[facility] EMERGENCY DEPARTMENT Provider Note  [name]: [id number] Arrival date & time: 06/15/20 1417    History Chief Complaint  Patient presents with  . Fall    HPI  [name] is a 59 y.o. female with history of bipolar on lithium reports she has been dizzy, like previous vertigo, for the last 7-10 days. She reports about 5 days ago while walking the dog she felt like her legs gave out on her and she fell down and hit her forehead and her abdomen. She has had a persistent moderate to severe frontal headache since then. Continues to have dizziness with nausea, occasional vomiting. No fever, cough, CP, SOB. No diarrhea or dysuria. She has been drinking plenty of water. She had a second fall while walking the dog 2 days ago but otherwise has been able to get around without too much difficulty, just states she feels week in her leg. She has not had any new medication changes recently. She states this does not feel similar to symptoms she had with high Lithium level in the past.    Past Medical History:  Diagnosis Date  . Anxiety   . Bipolar 1 disorder (HCC)    HX PSYCHOSIS  . Complication of anesthesia    POST AGGITATION  . Frequency of urination   . Headache(784.0)   . History of duodenal ulcer   . Hyperlipidemia   . IBS (irritable bowel syndrome)   . Nocturia   . Pelvic pain   . Personality disorder ([place])    W/ BORDERLINE FEATURES  . Urgency of urination     Past Surgical History:  Procedure Laterality Date  . CARDIAC CATHETERIZATION  10-09-1999  DR KATZ   NORMAL LVF/  NORMAL RCA/  NO CRITICAL DISEASE LEFT CORONARY SYSTEM  . CARDIOVASCULAR STRESS TEST  01-23-2011   NORMAL NUCLEAR STUDY/ NO ISCHEMIA/ EF 81%  . CYSTOSCOPY WITH BIOPSY N/A 06/03/2013   Procedure: CYSTOSCOPY WITH BIOPSY  INSTILLATION OF MARCAINE AND PYRIDIUM;  Surgeon: Hanley Ben, MD;  Location: [facility];  Service: Urology;  Laterality: N/A;  . LAPAROSCOPIC  CHOLECYSTECTOMY  11-08-2001  . LAPAROSCOPIC LEFT SALPINGOOPHORECTOMY AND LYSYS ADHESIONS  03-10-2002  . LAPAROSCOPIC REMOVAL OVARY REMNANT  2003   [place]  . LAPAROSCOPY N/A 12/14/2012   Procedure: LAPAROSCOPY DIAGNOSTIC;  Surgeon: Stark Klein, MD;  Location: [facility];  Service: General;  Laterality: N/A;  . TOTAL ABDOMINAL HYSTERECTOMY  2000   W/ RIGHT SALPINGOOPHORECTOMY  . TRANSTHORACIC ECHOCARDIOGRAM  10-13-2010   NORMAL LVF/  EF 55-60%    Family History  Problem Relation Age of Onset  . Sleep apnea Father   . Depression Father   . Alcohol abuse Father   . Hypertension Father   . Migraines Sister        headaches  . Anxiety disorder Sister   . Other Mother        MAC infection  . Anxiety disorder Mother   . Dementia Mother   . Heart disease Paternal Grandmother   . Hyperlipidemia Paternal Grandmother   . Alcohol abuse Paternal Grandfather   . Diabetes Neg Hx     Social History   Tobacco Use  . Smoking status: Former Smoker    Packs/day: 0.01    Years: 1.00    Pack years: 0.01    Types: Cigarettes    Quit date: 1980    Years since quitting: 41.8  . Smokeless tobacco: Never Used  . Tobacco comment: ONLY  SMOKED FOR 6 MONTHS --  QUIT  YRS AGO  Vaping Use  . Vaping Use: Never used  Substance Use Topics  . Alcohol use: Yes    Comment: occ  . Drug use: No     Home Medications Prior to Admission medications   Medication Sig Start Date End Date Taking? Authorizing Provider  albuterol (PROVENTIL HFA;VENTOLIN HFA) 108 (90 Base) MCG/ACT inhaler Inhale into the lungs. 08/20/17 08/20/18  [provider]  buPROPion (WELLBUTRIN XL) 150 MG 24 hr tablet TAKE 1 TABLET BY MOUTH IN THE MORNING FOR 4 DAYS AND THEN TAKE 2 IN THE MORNING FOR 5 DAYS AND THEN TAKE 3 IN THE MORNING 11/03/19   [provider]  Cholecalciferol (VITAMIN D) 50 MCG (2000 UT) tablet Take by mouth.    [provider]  doxycycline (VIBRA-TABS) 100 MG tablet Take 1 tablet (100 mg  total) by mouth 2 (two) times daily. 04/13/20   Billie Ruddy, MD  fenofibrate (TRICOR) 145 MG tablet Take 1 tablet (145 mg total) by mouth daily. 12/06/19   [name], [name], MD  fluticasone [facility]) 50 MCG/ACT nasal spray Place 2 sprays into both nostrils daily. 08/06/19   Brunetta Jeans, PA-C  Fluticasone-Salmeterol (ADVAIR DISKUS) 250-50 MCG/DOSE AEPB Inhale 1 puff into the lungs 2 (two) times daily. 08/02/19   [name], [name], MD  gabapentin (NEURONTIN) 300 MG capsule Take by mouth at bedtime.     [provider]  hydrOXYzine (ATARAX/VISTARIL) 25 MG tablet Take 1 tablet (25 mg total) by mouth 3 (three) times daily as needed for itching. 04/13/20   Billie Ruddy, MD  lithium carbonate (LITHOBID) 300 MG CR tablet Take 2 tablets (600 mg total) by mouth every evening. Patient taking differently: Take 1 tablet by mouth in the morning and 2 tablets in the evening. 06/27/15   [place], Elyse Jarvis, FNP  LORazepam (ATIVAN) 0.5 MG tablet Take 0.5 mg by mouth 2 (two) times daily. 13m at bedtime    [provider]  meclizine (ANTIVERT) 25 MG tablet Take 1 tablet (25 mg total) by mouth 3 (three) times daily as needed for dizziness. 06/15/20   STruddie Hidden MD  ondansetron (ZOFRAN) 4 MG tablet Take 1 tablet (4 mg total) by mouth daily as needed for nausea. 06/12/20   JMartinique [name], MD     Allergies    Morphine and related, Duloxetine, Latuda [lurasidone hcl], and Morphine   Review of Systems   Review of Systems A comprehensive review of systems was completed and negative except as noted in HPI.    Physical Exam BP 121/75 (BP Location: Right Arm)   Pulse 82   Temp 98 F (36.7 C) (Oral)   Resp 15   Ht 5' (1.524 m)   Wt 65.8 kg   SpO2 98%   BMI 28.32 kg/m   Physical Exam Vitals and nursing note reviewed.  Constitutional:      Appearance: Normal appearance.  HENT:     Head: Normocephalic and atraumatic.     Nose: Nose normal.     Mouth/Throat:     Mouth: Mucous  membranes are moist.  Eyes:     Extraocular Movements: Extraocular movements intact.     Conjunctiva/sclera: Conjunctivae normal.  Cardiovascular:     Rate and Rhythm: Normal rate.  Pulmonary:     Effort: Pulmonary effort is normal.     Breath sounds: Normal breath sounds.  Abdominal:     General: Abdomen is flat.  Palpations: Abdomen is soft.     Tenderness: There is no abdominal tenderness.     Comments: Bruising to LLQ, old and healing  Musculoskeletal:        General: No swelling. Normal range of motion.     Cervical back: Neck supple.  Skin:    General: Skin is warm and dry.  Neurological:     General: No focal deficit present.     Mental Status: She is alert and oriented to person, place, and time.     Cranial Nerves: No cranial nerve deficit.     Sensory: No sensory deficit.     Motor: No weakness.     Coordination: Coordination normal.  Psychiatric:        Mood and Affect: Mood normal.      ED Results / Procedures / Treatments   Labs (all labs ordered are listed, but only abnormal results are displayed) Labs Reviewed  COMPREHENSIVE METABOLIC PANEL - Abnormal; Notable for the following components:      Result Value   Glucose, Bld 120 (*)    All other components within normal limits  URINALYSIS, ROUTINE W REFLEX MICROSCOPIC - Abnormal; Notable for the following components:   APPearance HAZY (*)    All other components within normal limits  CBC WITH DIFFERENTIAL/PLATELET  LITHIUM LEVEL    EKG EKG Interpretation  Date/Time:  Friday June 15 2020 16:24:26 EDT Ventricular Rate:  77 PR Interval:    QRS Duration: 79 QT Interval:  383 QTC Calculation: 434 R Axis:   58 Text Interpretation: Sinus rhythm Low voltage, precordial leads Since last tracing Rate slower Confirmed by Calvert Cantor [phone]) on 06/15/2020 4:27:52 PM    Radiology CT Head Wo Contrast  Result Date: 06/15/2020 CLINICAL DATA:  Fall 2 days ago with forehead trauma EXAM: CT HEAD  WITHOUT CONTRAST TECHNIQUE: Contiguous axial images were obtained from the base of the skull through the vertex without intravenous contrast. COMPARISON:  10/22/2017 FINDINGS: Brain: No evidence of acute infarction, hemorrhage, hydrocephalus, extra-axial collection or mass lesion/mass effect. Scattered low-density changes within the periventricular and subcortical white matter compatible with chronic microvascular ischemic change. Mild diffuse cerebral volume loss. Vascular: No hyperdense vessel or unexpected calcification. Skull: Normal. Negative for fracture or focal lesion. Sinuses/Orbits: No acute finding. Other: Negative for scalp hematoma. IMPRESSION: 1. No acute intracranial findings. 2. Mild chronic microvascular ischemic change and cerebral volume loss. Electronically Signed   By: Davina Poke D.O.   On: 06/15/2020 16:11    Procedures Procedures  Medications Ordered in the ED Medications  sodium chloride 0.9 % bolus 500 mL (500 mLs Intravenous New Bag/Given 06/15/20 1639)  ketorolac (TORADOL) 30 MG/ML injection 30 mg (30 mg Intravenous Given 06/15/20 1639)     MDM Rules/Calculators/A&P MDM Patient with dizziness, similar to previous vertigo, treated by PCP with Zofran but has never taken Meclizine. Now with persistent headache and LE weakness after falling and hitting her head. Will check CT head for signs of injury, labs for other causes of weakness.  ED Course  I have reviewed the triage vital signs and the nursing notes.  Pertinent labs & imaging results that were available during my care of the patient were reviewed by me and considered in my medical decision making (see chart for details).  Clinical Course as of Jun 15 1813  Fri Jun 15, 2020  1628 CT head negative for injury. CBC is normal.    [CS]  1630 CMP normal.    [CS]  1726 UA is neg.    [CS]  1751 Lithium is therapeutic.    [CS]  4709 Patient reports headache is improved. Able to walk to the bathroom without  difficulty. Plan discharge with Meclizine for vertiginous dizziness and PCP follow up;    [CS]    Clinical Course User Index [CS] Truddie Hidden, MD    Final Clinical Impression(s) / ED Diagnoses Final diagnoses:  Fall, initial encounter  Injury of head, initial encounter  Vertigo    Rx / DC Orders ED Discharge Orders         Ordered    meclizine (ANTIVERT) 25 MG tablet  3 times daily PRN        06/15/20 1755           Truddie Hidden, MD 06/15/20 1814

## 2020-06-29 DIAGNOSIS — Z23 Encounter for immunization: Secondary | ICD-10-CM | POA: Diagnosis not present

## 2020-07-19 ENCOUNTER — Telehealth (INDEPENDENT_AMBULATORY_CARE_PROVIDER_SITE_OTHER): Payer: Medicare Other | Admitting: Family Medicine

## 2020-07-19 ENCOUNTER — Encounter: Payer: Self-pay | Admitting: Family Medicine

## 2020-07-19 VITALS — Temp 97.8°F

## 2020-07-19 DIAGNOSIS — J029 Acute pharyngitis, unspecified: Secondary | ICD-10-CM | POA: Diagnosis not present

## 2020-07-19 DIAGNOSIS — R110 Nausea: Secondary | ICD-10-CM | POA: Diagnosis not present

## 2020-07-19 MED ORDER — ONDANSETRON HCL 4 MG PO TABS
4.0000 mg | ORAL_TABLET | Freq: Every day | ORAL | 0 refills | Status: DC | PRN
Start: 2020-07-19 — End: 2021-05-08

## 2020-07-19 MED ORDER — AMOXICILLIN 500 MG PO CAPS: 500.0000 mg | ORAL_CAPSULE | Freq: Two times a day (BID) | ORAL | 0 refills | Status: DC

## 2020-07-19 NOTE — Patient Instructions (Addendum)
-  I sent the medication(s) we discussed to your pharmacy: Meds ordered this encounter  Medications  . amoxicillin (AMOXIL) 500 MG capsule    Sig: Take 1 capsule (500 mg total) by mouth 2 (two) times daily.    Dispense:  20 capsule    Refill:  0  . ondansetron (ZOFRAN) 4 MG tablet    Sig: Take 1 tablet (4 mg total) by mouth daily as needed for nausea.    Dispense:  10 tablet    Refill:  0   Staying home while sick, except to seek medical care.  Consider Covid testing.  I hope you are feeling better soon!  Seek in person care promptly if your symptoms worsen, new concerns arise or you are not improving with treatment.  It was nice to meet you today. I help [facility] out with telemedicine visits on Tuesdays and Thursdays and am available for visits on those days. If you have any concerns or questions following this visit please schedule a follow up visit with your Primary Care doctor or seek care at a local urgent care clinic to avoid delays in care.

## 2020-07-19 NOTE — Progress Notes (Signed)
Virtual Visit via Video Note  I connected with [name]  on 07/19/20 at  3:00 PM EST by a video enabled telemedicine application and verified that I am speaking with the correct person using two identifiers.  Location patient: home, [place] Location provider:work or home office Persons participating in the virtual visit: patient, provider  I discussed the limitations of evaluation and management by telemedicine and the availability of in person appointments. The patient expressed understanding and agreed to proceed.   HPI:  Acute telemedicine visit for Sore throat: -Onset: 4 days ago -Symptoms include: sore throat, body aches, chills -Denies:fevers, cough, nasal congestion, inability to eat/drink, sick contacts -Has tried: throat spray, ibuprofen, tylenol -Pertinent past medical history:see below -Pertinent medication allergies: no hx tonsillectomy -COVID-19 vaccine status: fully vaccinated + booster, has not had flu shot -she also asks for a refill on zofran - reports uses when gets migraines with nausea  ROS: See pertinent positives and negatives per HPI.  Past Medical History:  Diagnosis Date  . Anxiety   . Bipolar 1 disorder (HCC)    HX PSYCHOSIS  . Complication of anesthesia    POST AGGITATION  . Frequency of urination   . Headache(784.0)   . History of duodenal ulcer   . Hyperlipidemia   . IBS (irritable bowel syndrome)   . Nocturia   . Pelvic pain   . Personality disorder ([place])    W/ BORDERLINE FEATURES  . Urgency of urination     Past Surgical History:  Procedure Laterality Date  . CARDIAC CATHETERIZATION  10-09-1999  DR KATZ   NORMAL LVF/  NORMAL RCA/  NO CRITICAL DISEASE LEFT CORONARY SYSTEM  . CARDIOVASCULAR STRESS TEST  01-23-2011   NORMAL NUCLEAR STUDY/ NO ISCHEMIA/ EF 81%  . CYSTOSCOPY WITH BIOPSY N/A 06/03/2013   Procedure: CYSTOSCOPY WITH BIOPSY  INSTILLATION OF MARCAINE AND PYRIDIUM;  Surgeon: Hanley Ben, MD;  Location: [facility];   Service: Urology;  Laterality: N/A;  . LAPAROSCOPIC CHOLECYSTECTOMY  11-08-2001  . LAPAROSCOPIC LEFT SALPINGOOPHORECTOMY AND LYSYS ADHESIONS  03-10-2002  . LAPAROSCOPIC REMOVAL OVARY REMNANT  2003   [place]  . LAPAROSCOPY N/A 12/14/2012   Procedure: LAPAROSCOPY DIAGNOSTIC;  Surgeon: Stark Klein, MD;  Location: [facility];  Service: General;  Laterality: N/A;  . TOTAL ABDOMINAL HYSTERECTOMY  2000   W/ RIGHT SALPINGOOPHORECTOMY  . TRANSTHORACIC ECHOCARDIOGRAM  10-13-2010   NORMAL LVF/  EF 55-60%     Current Outpatient Medications:  .  buPROPion (WELLBUTRIN XL) 150 MG 24 hr tablet, TAKE 1 TABLET BY MOUTH IN THE MORNING FOR 4 DAYS AND THEN TAKE 2 IN THE MORNING FOR 5 DAYS AND THEN TAKE 3 IN THE MORNING, Disp: , Rfl:  .  Cholecalciferol (VITAMIN D) 50 MCG (2000 UT) tablet, Take by mouth., Disp: , Rfl:  .  fenofibrate (TRICOR) 145 MG tablet, Take 1 tablet (145 mg total) by mouth daily., Disp: 90 tablet, Rfl: 1 .  fluticasone (FLONASE) 50 MCG/ACT nasal spray, Place 2 sprays into both nostrils daily., Disp: 16 g, Rfl: 0 .  Fluticasone-Salmeterol (ADVAIR DISKUS) 250-50 MCG/DOSE AEPB, Inhale 1 puff into the lungs 2 (two) times daily., Disp: 60 each, Rfl: 3 .  gabapentin (NEURONTIN) 300 MG capsule, Take by mouth at bedtime. , Disp: , Rfl:  .  hydrOXYzine (ATARAX/VISTARIL) 25 MG tablet, Take 1 tablet (25 mg total) by mouth 3 (three) times daily as needed for itching., Disp: 30 tablet, Rfl: 0 .  lithium carbonate (LITHOBID) 300 MG CR  tablet, Take 2 tablets (600 mg total) by mouth every evening. (Patient taking differently: Take 1 tablet by mouth in the morning and 2 tablets in the evening.), Disp: 60 tablet, Rfl: 0 .  LORazepam (ATIVAN) 0.5 MG tablet, Take 0.5 mg by mouth 2 (two) times daily. 5mg  at bedtime, Disp: , Rfl:  .  meclizine (ANTIVERT) 25 MG tablet, Take 1 tablet (25 mg total) by mouth 3 (three) times daily as needed for dizziness., Disp: 30 tablet, Rfl: 0 .  ondansetron (ZOFRAN) 4 MG tablet,  Take 1 tablet (4 mg total) by mouth daily as needed for nausea., Disp: 10 tablet, Rfl: 0 .  albuterol (PROVENTIL HFA;VENTOLIN HFA) 108 (90 Base) MCG/ACT inhaler, Inhale into the lungs., Disp: , Rfl:  .  amoxicillin (AMOXIL) 500 MG capsule, Take 1 capsule (500 mg total) by mouth 2 (two) times daily., Disp: 20 capsule, Rfl: 0  EXAM:  VITALS per patient if applicable:  GENERAL: alert, oriented, appears well and in no acute distress  HEENT: atraumatic, conjunttiva clear, no obvious abnormalities on inspection of external nose and ears  NECK: normal movements of the head and neck  LUNGS: on inspection no signs of respiratory distress, breathing rate appears normal, no obvious gross SOB, gasping or wheezing  CV: no obvious cyanosis  MS: moves all visible extremities without noticeable abnormality  PSYCH/NEURO: pleasant and cooperative, no obvious depression or anxiety, speech and thought processing grossly intact  ASSESSMENT AND PLAN:  Discussed the following assessment and plan:  Sore throat  Nausea  -we discussed possible serious and likely etiologies, options for evaluation and workup, limitations of telemedicine visit vs in person visit, treatment, treatment risks and precautions. Pt prefers to treat via telemedicine empirically rather than in person at this moment.  Discussed potential pharyngitis, viral or bacterial, versus other.  Advised exam with strep testing and Covid testing would be ideal.  She prefers to try empiric treatment with amoxicillin for suspected strep pharyngitis.  She agrees to seek in person care if recurrent or persistent symptoms or other symptoms develop.  She also requests a refill on one of her chronic medications, Zofran, which she takes for nausea with migraines.  Refill sent. Work/School slipped offered: declined Scheduled follow up with PCP offered: Agrees to follow-up if needed. Advised to seek prompt in person care if worsening, new symptoms arise, or  if is not improving with treatment. Discussed options for inperson care if PCP office not available. Did let this patient know that I only do telemedicine on Tuesdays and Thursdays for [place]. Advised to schedule follow up visit with PCP or [facility] if any further questions or concerns to avoid delays in care.   I discussed the assessment and treatment plan with the patient. The patient was provided an opportunity to ask questions and all were answered. The patient agreed with the plan and demonstrated an understanding of the instructions.     Lucretia Kern, DO

## 2020-07-26 DIAGNOSIS — Z20822 Contact with and (suspected) exposure to covid-19: Secondary | ICD-10-CM | POA: Diagnosis not present

## 2020-07-27 ENCOUNTER — Telehealth (INDEPENDENT_AMBULATORY_CARE_PROVIDER_SITE_OTHER): Payer: Medicare Other | Admitting: Family Medicine

## 2020-07-27 ENCOUNTER — Other Ambulatory Visit: Payer: Self-pay

## 2020-07-27 ENCOUNTER — Encounter: Payer: Self-pay | Admitting: Family Medicine

## 2020-07-27 DIAGNOSIS — J029 Acute pharyngitis, unspecified: Secondary | ICD-10-CM

## 2020-07-27 NOTE — Progress Notes (Signed)
Patient ID: [name], female   DOB: March 01, 1961, 59 y.o.   MRN: [id number]  This visit type was conducted due to national recommendations for restrictions regarding the COVID-19 pandemic in an effort to limit this patient's exposure and mitigate transmission in our community.   Virtual Visit via Video Note  I connected with [name] on 07/27/20 at 10:45 AM EST by a video enabled telemedicine application and verified that I am speaking with the correct person using two identifiers.  Location patient: home Location provider:work or home office Persons participating in the virtual visit: patient, provider  I discussed the limitations of evaluation and management by telemedicine and the availability of in person appointments. The patient expressed understanding and agreed to proceed.   HPI: [name] called with persistent sore throat.  Refer to prior note.  She was treated empirically for possible strep with amoxicillin and is on day 8.  Her predominant symptom is sore throat.  She does have some body aches and mild congestion.  Minimal cough.  Went for Covid PCR test yesterday and this came back negative.  Her nausea from previous call has resolved.  No fever.  Swallowing okay.  She has taken occasional Tylenol but no nonsteroidals.  No skin rash.  No significant headache.  She has not seen any evidence for thrush.   ROS: See pertinent positives and negatives per HPI.  Past Medical History:  Diagnosis Date  . Anxiety   . Bipolar 1 disorder (HCC)    HX PSYCHOSIS  . Complication of anesthesia    POST AGGITATION  . Frequency of urination   . Headache(784.0)   . History of duodenal ulcer   . Hyperlipidemia   . IBS (irritable bowel syndrome)   . Nocturia   . Pelvic pain   . Personality disorder ([place])    W/ BORDERLINE FEATURES  . Urgency of urination     Past Surgical History:  Procedure Laterality Date  . CARDIAC CATHETERIZATION  10-09-1999  DR KATZ   NORMAL LVF/   NORMAL RCA/  NO CRITICAL DISEASE LEFT CORONARY SYSTEM  . CARDIOVASCULAR STRESS TEST  01-23-2011   NORMAL NUCLEAR STUDY/ NO ISCHEMIA/ EF 81%  . CYSTOSCOPY WITH BIOPSY N/A 06/03/2013   Procedure: CYSTOSCOPY WITH BIOPSY  INSTILLATION OF MARCAINE AND PYRIDIUM;  Surgeon: Hanley Ben, MD;  Location: [facility];  Service: Urology;  Laterality: N/A;  . LAPAROSCOPIC CHOLECYSTECTOMY  11-08-2001  . LAPAROSCOPIC LEFT SALPINGOOPHORECTOMY AND LYSYS ADHESIONS  03-10-2002  . LAPAROSCOPIC REMOVAL OVARY REMNANT  2003   [place]  . LAPAROSCOPY N/A 12/14/2012   Procedure: LAPAROSCOPY DIAGNOSTIC;  Surgeon: Stark Klein, MD;  [facility];  Service: General;  Laterality: N/A;  . TOTAL ABDOMINAL HYSTERECTOMY  2000   W/ RIGHT SALPINGOOPHORECTOMY  . TRANSTHORACIC ECHOCARDIOGRAM  10-13-2010   NORMAL LVF/  EF 55-60%    Family History  Problem Relation Age of Onset  . Sleep apnea Father   . Depression Father   . Alcohol abuse Father   . Hypertension Father   . Migraines Sister        headaches  . Anxiety disorder Sister   . Other Mother        MAC infection  . Anxiety disorder Mother   . Dementia Mother   . Heart disease Paternal Grandmother   . Hyperlipidemia Paternal Grandmother   . Alcohol abuse Paternal Grandfather   . Diabetes Neg Hx     SOCIAL HX: Ex-smoker   Current  Outpatient Medications:  .  amoxicillin (AMOXIL) 500 MG capsule, Take 1 capsule (500 mg total) by mouth 2 (two) times daily., Disp: 20 capsule, Rfl: 0 .  buPROPion (WELLBUTRIN XL) 150 MG 24 hr tablet, TAKE 1 TABLET BY MOUTH IN THE MORNING FOR 4 DAYS AND THEN TAKE 2 IN THE MORNING FOR 5 DAYS AND THEN TAKE 3 IN THE MORNING, Disp: , Rfl:  .  Cholecalciferol (VITAMIN D) 50 MCG (2000 UT) tablet, Take by mouth., Disp: , Rfl:  .  fenofibrate (TRICOR) 145 MG tablet, Take 1 tablet (145 mg total) by mouth daily., Disp: 90 tablet, Rfl: 1 .  fluticasone (FLONASE) 50 MCG/ACT nasal spray, Place 2 sprays into both  nostrils daily., Disp: 16 g, Rfl: 0 .  Fluticasone-Salmeterol (ADVAIR DISKUS) 250-50 MCG/DOSE AEPB, Inhale 1 puff into the lungs 2 (two) times daily., Disp: 60 each, Rfl: 3 .  gabapentin (NEURONTIN) 300 MG capsule, Take by mouth at bedtime. , Disp: , Rfl:  .  hydrOXYzine (ATARAX/VISTARIL) 25 MG tablet, Take 1 tablet (25 mg total) by mouth 3 (three) times daily as needed for itching., Disp: 30 tablet, Rfl: 0 .  lithium carbonate (LITHOBID) 300 MG CR tablet, Take 2 tablets (600 mg total) by mouth every evening. (Patient taking differently: Take 1 tablet by mouth in the morning and 2 tablets in the evening.), Disp: 60 tablet, Rfl: 0 .  LORazepam (ATIVAN) 0.5 MG tablet, Take 0.5 mg by mouth 2 (two) times daily. 60m at bedtime, Disp: , Rfl:  .  meclizine (ANTIVERT) 25 MG tablet, Take 1 tablet (25 mg total) by mouth 3 (three) times daily as needed for dizziness., Disp: 30 tablet, Rfl: 0 .  ondansetron (ZOFRAN) 4 MG tablet, Take 1 tablet (4 mg total) by mouth daily as needed for nausea., Disp: 10 tablet, Rfl: 0 .  albuterol (PROVENTIL HFA;VENTOLIN HFA) 108 (90 Base) MCG/ACT inhaler, Inhale into the lungs., Disp: , Rfl:   EXAM:  VITALS per patient if applicable:  GENERAL: alert, oriented, appears well and in no acute distress  HEENT: atraumatic, conjunttiva clear, no obvious abnormalities on inspection of external nose and ears  NECK: normal movements of the head and neck  LUNGS: on inspection no signs of respiratory distress, breathing rate appears normal, no obvious gross SOB, gasping or wheezing  CV: no obvious cyanosis  MS: moves all visible extremities without noticeable abnormality  PSYCH/NEURO: pleasant and cooperative, no obvious depression or anxiety, speech and thought processing grossly intact  ASSESSMENT AND PLAN:  Discussed the following assessment and plan:  Upper respiratory symptoms.  Recent Covid PCR test yesterday reportedly negative.  Predominant symptom is sore throat.   Not improved with amoxicillin.  She does not describe anything that sounds suspicious for peritonsillar abscess or peritonsillar cellulitis  -We recommend she consider Aleve or ibuprofen for sore throat symptoms. -Continue plenty of fluids -Follow-up immediately for any fever or worsening symptoms.  We also suggested she consider office follow-up next week if not further improved     I discussed the assessment and treatment plan with the patient. The patient was provided an opportunity to ask questions and all were answered. The patient agreed with the plan and demonstrated an understanding of the instructions.   The patient was advised to call back or seek an in-person evaluation if the symptoms worsen or if the condition fails to improve as anticipated.     BCarolann Littler MD

## 2020-07-31 ENCOUNTER — Telehealth: Payer: Self-pay | Admitting: Family Medicine

## 2020-07-31 NOTE — Telephone Encounter (Signed)
Okay for in office

## 2020-07-31 NOTE — Telephone Encounter (Signed)
Yes.  She had negative Covid test and is > 10 days from onset and sounds unlikely to be Covid related.

## 2020-07-31 NOTE — Telephone Encounter (Signed)
Patient called and stated that she was seen by Dr. Elease Hashimoto on 12/10 for sore throat and congestion but is still experiencing a sore throat. Pt stated that provider told her that if medication doesn't help and if symptoms doesn't resolve then she can be seen in the office. Is this ok?

## 2020-07-31 NOTE — Telephone Encounter (Signed)
Okay to schedule in office.

## 2020-08-01 NOTE — Telephone Encounter (Signed)
LVM for patient to call back to schedule appointment °

## 2020-08-06 ENCOUNTER — Ambulatory Visit (INDEPENDENT_AMBULATORY_CARE_PROVIDER_SITE_OTHER): Payer: Medicare Other | Admitting: Family Medicine

## 2020-08-06 ENCOUNTER — Other Ambulatory Visit: Payer: Self-pay

## 2020-08-06 ENCOUNTER — Ambulatory Visit: Payer: Medicare Other | Admitting: Family Medicine

## 2020-08-06 ENCOUNTER — Encounter: Payer: Self-pay | Admitting: Family Medicine

## 2020-08-06 VITALS — BP 124/80 | HR 100 | Ht 60.0 in | Wt 158.0 lb

## 2020-08-06 DIAGNOSIS — L290 Pruritus ani: Secondary | ICD-10-CM | POA: Insufficient documentation

## 2020-08-06 DIAGNOSIS — K219 Gastro-esophageal reflux disease without esophagitis: Secondary | ICD-10-CM

## 2020-08-06 DIAGNOSIS — R141 Gas pain: Secondary | ICD-10-CM | POA: Insufficient documentation

## 2020-08-06 DIAGNOSIS — J029 Acute pharyngitis, unspecified: Secondary | ICD-10-CM | POA: Diagnosis not present

## 2020-08-06 DIAGNOSIS — M26622 Arthralgia of left temporomandibular joint: Secondary | ICD-10-CM

## 2020-08-06 DIAGNOSIS — R1012 Left upper quadrant pain: Secondary | ICD-10-CM | POA: Insufficient documentation

## 2020-08-06 DIAGNOSIS — R1033 Periumbilical pain: Secondary | ICD-10-CM | POA: Insufficient documentation

## 2020-08-06 DIAGNOSIS — Z23 Encounter for immunization: Secondary | ICD-10-CM

## 2020-08-06 DIAGNOSIS — K589 Irritable bowel syndrome without diarrhea: Secondary | ICD-10-CM

## 2020-08-06 DIAGNOSIS — K5730 Diverticulosis of large intestine without perforation or abscess without bleeding: Secondary | ICD-10-CM

## 2020-08-06 DIAGNOSIS — R194 Change in bowel habit: Secondary | ICD-10-CM | POA: Insufficient documentation

## 2020-08-06 DIAGNOSIS — R152 Fecal urgency: Secondary | ICD-10-CM

## 2020-08-06 DIAGNOSIS — K625 Hemorrhage of anus and rectum: Secondary | ICD-10-CM | POA: Insufficient documentation

## 2020-08-06 HISTORY — DX: Fecal urgency: R15.2

## 2020-08-06 HISTORY — DX: Irritable bowel syndrome, unspecified: K58.9

## 2020-08-06 HISTORY — DX: Gastro-esophageal reflux disease without esophagitis: K21.9

## 2020-08-06 HISTORY — DX: Diverticulosis of large intestine without perforation or abscess without bleeding: K57.30

## 2020-08-06 HISTORY — DX: Pruritus ani: L29.0

## 2020-08-06 HISTORY — DX: Left upper quadrant pain: R10.12

## 2020-08-06 HISTORY — DX: Periumbilical pain: R10.33

## 2020-08-06 NOTE — Patient Instructions (Signed)
I will set up referral to TMJ specialist.   Temporomandibular Joint Syndrome  Temporomandibular joint syndrome (TMJ syndrome) is a condition that causes pain in the temporomandibular joints. These joints are located near your ears and allow your jaw to open and close. For people with TMJ syndrome, chewing, biting, or other movements of the jaw can be difficult or painful. TMJ syndrome is often mild and goes away within a few weeks. However, sometimes the condition becomes a long-term (chronic) problem. What are the causes? This condition may be caused by:  Grinding your teeth or clenching your jaw. Some people do this when they are under stress.  Arthritis.  Injury to the jaw.  Head or neck injury.  Teeth or dentures that are not aligned well. In some cases, the cause of TMJ syndrome may not be known. What are the signs or symptoms? The most common symptom of this condition is an aching pain on the side of the head in the area of the TMJ. Other symptoms may include:  Pain when moving your jaw, such as when chewing or biting.  Being unable to open your jaw all the way.  Making a clicking sound when you open your mouth.  Headache.  Earache.  Neck or shoulder pain. How is this diagnosed? This condition may be diagnosed based on:  Your symptoms and medical history.  A physical exam. Your health care provider may check the range of motion of your jaw.  Imaging tests, such as X-rays or an MRI. You may also need to see your dentist, who will determine if your teeth and jaw are lined up correctly. How is this treated? TMJ syndrome often goes away on its own. If treatment is needed, the options may include:  Eating soft foods and applying ice or heat.  Medicines to relieve pain or inflammation.  Medicines or massage to relax the muscles.  A splint, bite plate, or mouthpiece to prevent teeth grinding or jaw clenching.  Relaxation techniques or counseling to help reduce  stress.  A therapy for pain in which an electrical current is applied to the nerves through the skin (transcutaneous electrical nerve stimulation).  Acupuncture. This is sometimes helpful to relieve pain.  Jaw surgery. This is rarely needed. Follow these instructions at home:  Eating and drinking  Eat a soft diet if you are having trouble chewing.  Avoid foods that require a lot of chewing. Do not chew gum. General instructions  Take over-the-counter and prescription medicines only as told by your health care provider.  If directed, put ice on the painful area. ? Put ice in a plastic bag. ? Place a towel between your skin and the bag. ? Leave the ice on for 20 minutes, 2-3 times a day.  Apply a warm, wet cloth (warm compress) to the painful area as directed.  Massage your jaw area and do any jaw stretching exercises as told by your health care provider.  If you were given a splint, bite plate, or mouthpiece, wear it as told by your health care provider.  Keep all follow-up visits as told by your health care provider. This is important. Contact a health care provider if:  You are having trouble eating.  You have new or worsening symptoms. Get help right away if:  Your jaw locks open or closed. Summary  Temporomandibular joint syndrome (TMJ syndrome) is a condition that causes pain in the temporomandibular joints. These joints are located near your ears and allow your jaw to  open and close.  TMJ syndrome is often mild and goes away within a few weeks. However, sometimes the condition becomes a long-term (chronic) problem.  Symptoms include an aching pain on the side of the head in the area of the TMJ, pain when chewing or biting, and being unable to open your jaw all the way. You may also make a clicking sound when you open your mouth.  TMJ syndrome often goes away on its own. If treatment is needed, it may include medicines to relieve pain, reduce inflammation, or relax  the muscles. A splint, bite plate, or mouthpiece may also be used to prevent teeth grinding or jaw clenching. This information is not intended to replace advice given to you by your health care provider. Make sure you discuss any questions you have with your health care provider. Document Revised: 10/16/2017 Document Reviewed: 09/15/2017 Elsevier Patient Education  2020 [employer].

## 2020-08-06 NOTE — Progress Notes (Signed)
Established Patient Office Visit  Subjective:  Patient ID: [name], female    DOB: 1961-02-10  Age: 59 y.o. MRN: [id number]  CC:  Chief Complaint  Patient presents with  . Sore Throat    HPI [name] presents for follow-up regarding sore throat symptoms.  She has had about 3 to 4 weeks now of sore throat symptoms predominantly left-sided.  She had a couple of virtual encounters for this.  She had recently been prescribed amoxicillin empirically to cover for strep but that did not make any difference.  She is tried multiple over-the-counter things including Tylenol, ibuprofen, Chloraseptic spray all with minimal relief.  She is not aware of any adenopathy.  No fevers or chills.  No cough.  She states that she frequently bites her tongue and thinks she may have some malalignment issues with her TMJ joint.  Occasional pain with chewing on the left side.  Past Medical History:  Diagnosis Date  . Anxiety   . Bipolar 1 disorder (HCC)    HX PSYCHOSIS  . Complication of anesthesia    POST AGGITATION  . Frequency of urination   . Headache(784.0)   . History of duodenal ulcer   . Hyperlipidemia   . IBS (irritable bowel syndrome)   . Nocturia   . Pelvic pain   . Personality disorder ([place])    W/ BORDERLINE FEATURES  . Urgency of urination     Past Surgical History:  Procedure Laterality Date  . CARDIAC CATHETERIZATION  10-09-1999  DR KATZ   NORMAL LVF/  NORMAL RCA/  NO CRITICAL DISEASE LEFT CORONARY SYSTEM  . CARDIOVASCULAR STRESS TEST  01-23-2011   NORMAL NUCLEAR STUDY/ NO ISCHEMIA/ EF 81%  . CYSTOSCOPY WITH BIOPSY N/A 06/03/2013   Procedure: CYSTOSCOPY WITH BIOPSY  INSTILLATION OF MARCAINE AND PYRIDIUM;  Surgeon: Hanley Ben, MD;  Location: [facility];  Service: Urology;  Laterality: N/A;  . LAPAROSCOPIC CHOLECYSTECTOMY  11-08-2001  . LAPAROSCOPIC LEFT SALPINGOOPHORECTOMY AND LYSYS ADHESIONS  03-10-2002  . LAPAROSCOPIC REMOVAL OVARY REMNANT   2003   [place]  . LAPAROSCOPY N/A 12/14/2012   Procedure: LAPAROSCOPY DIAGNOSTIC;  Surgeon: Stark Klein, MD;  Location: [facility];  Service: General;  Laterality: N/A;  . TOTAL ABDOMINAL HYSTERECTOMY  2000   W/ RIGHT SALPINGOOPHORECTOMY  . TRANSTHORACIC ECHOCARDIOGRAM  10-13-2010   NORMAL LVF/  EF 55-60%    Family History  Problem Relation Age of Onset  . Sleep apnea Father   . Depression Father   . Alcohol abuse Father   . Hypertension Father   . Migraines Sister        headaches  . Anxiety disorder Sister   . Other Mother        MAC infection  . Anxiety disorder Mother   . Dementia Mother   . Heart disease Paternal Grandmother   . Hyperlipidemia Paternal Grandmother   . Alcohol abuse Paternal Grandfather   . Diabetes Neg Hx     Social History   Socioeconomic History  . Marital status: Married    Spouse name: Not on file  . Number of children: 0  . Years of education: college  . Highest education level: Not on file  Occupational History  . 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 Occupation: disability  Tobacco Use  . Smoking status: Former Smoker    Packs/day: 0.01    Years: 1.00    Pack years: 0.01    Types: Cigarettes  Quit date: 70    Years since quitting: 41.9  . Smokeless tobacco: Never Used  . Tobacco comment: ONLY SMOKED FOR 6 MONTHS --  QUIT  YRS AGO  Vaping Use  . Vaping Use: Never used  Substance and Sexual Activity  . Alcohol use: Yes    Comment: occ  . Drug use: No  . Sexual activity: Not on file  Other Topics Concern  . 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    no tobacco   Caffeine Use: very little, three times a week   Social Determinants of Health   Financial Resource Strain: Not on file  Food Insecurity: Not on file  Transportation Needs: Not on file  Physical  Activity: Not on file  Stress: Not on file  Social Connections: Not on file  Intimate Partner Violence: Not on file    Outpatient Medications Prior to Visit  Medication Sig Dispense Refill  . albuterol (PROVENTIL HFA;VENTOLIN HFA) 108 (90 Base) MCG/ACT inhaler Inhale into the lungs.    [name] Kitchen amoxicillin (AMOXIL) 500 MG capsule Take 1 capsule (500 mg total) by mouth 2 (two) times daily. 20 capsule 0  . buPROPion (WELLBUTRIN XL) 150 MG 24 hr tablet TAKE 1 TABLET BY MOUTH IN THE MORNING FOR 4 DAYS AND THEN TAKE 2 IN THE MORNING FOR 5 DAYS AND THEN TAKE 3 IN THE MORNING    . Cholecalciferol (VITAMIN D) 50 MCG (2000 UT) tablet Take by mouth.    . fenofibrate (TRICOR) 145 MG tablet Take 1 tablet (145 mg total) by mouth daily. 90 tablet 1  . fluticasone (FLONASE) 50 MCG/ACT nasal spray Place 2 sprays into both nostrils daily. 16 g 0  . Fluticasone-Salmeterol (ADVAIR DISKUS) 250-50 MCG/DOSE AEPB Inhale 1 puff into the lungs 2 (two) times daily. 60 each 3  . gabapentin (NEURONTIN) 300 MG capsule Take by mouth at bedtime.     . hydrOXYzine (ATARAX/VISTARIL) 25 MG tablet Take 1 tablet (25 mg total) by mouth 3 (three) times daily as needed for itching. 30 tablet 0  . lithium carbonate (LITHOBID) 300 MG CR tablet Take 2 tablets (600 mg total) by mouth every evening. (Patient taking differently: Take 1 tablet by mouth in the morning and 2 tablets in the evening.) 60 tablet 0  . LORazepam (ATIVAN) 0.5 MG tablet Take 0.5 mg by mouth 2 (two) times daily. 79m at bedtime    . meclizine (ANTIVERT) 25 MG tablet Take 1 tablet (25 mg total) by mouth 3 (three) times daily as needed for dizziness. 30 tablet 0  . omeprazole (PRILOSEC) 40 MG capsule SMARTSIG:1 Capsule(s) By Mouth Every Evening    . ondansetron (ZOFRAN) 4 MG tablet Take 1 tablet (4 mg total) by mouth daily as needed for nausea. 10 tablet 0   No facility-administered medications prior to visit.    Allergies  Allergen Reactions  . Morphine And Related  Nausea And Vomiting and Nausea Only  . Ambien [Zolpidem]     Per patient this caused her to fall  . Duloxetine     Other reaction(s): SWELLING/EDEMA  . Latuda [Lurasidone Hcl]   . Morphine Nausea Only    ROS Review of Systems  Constitutional: Negative for chills and fever.  HENT: Positive for sore throat. Negative for sinus pressure and trouble swallowing.   Respiratory:  Negative for cough and shortness of breath.   Cardiovascular: Negative for chest pain.  Hematological: Negative for adenopathy.      Objective:    Physical Exam Vitals reviewed.  Constitutional:      Appearance: She is well-developed.  HENT:     Head: Normocephalic.     Comments: She has some moderate tenderness in palpating around the left TMJ joint.  None noted on the right    Right Ear: Tympanic membrane and ear canal normal.     Left Ear: Tympanic membrane and ear canal normal.     Mouth/Throat:     Mouth: Mucous membranes are moist.     Pharynx: Oropharynx is clear. No oropharyngeal exudate or posterior oropharyngeal erythema.     Tonsils: No tonsillar exudate or tonsillar abscesses.  Cardiovascular:     Rate and Rhythm: Normal rate and regular rhythm.  Pulmonary:     Effort: Pulmonary effort is normal.     Breath sounds: Normal breath sounds. No wheezing or rales.  Musculoskeletal:     Cervical back: Neck supple.  Lymphadenopathy:     Cervical: No cervical adenopathy.  Neurological:     Mental Status: She is alert.     BP 124/80   Pulse 100   Ht 5' (1.524 m)   Wt 158 lb (71.7 kg)   SpO2 97%   BMI 30.86 kg/m  Wt Readings from Last 3 Encounters:  08/06/20 158 lb (71.7 kg)  06/15/20 145 lb (65.8 kg)  04/13/20 148 lb 3.2 oz (67.2 kg)     Health Maintenance Due  Topic Date Due  . MAMMOGRAM  09/19/2019  . COVID-19 Vaccine (3 - Booster for Moderna series) 05/18/2020    There are no preventive care reminders to display for this patient.  Lab Results  Component Value Date   TSH  3.07 10/14/2017   Lab Results  Component Value Date   WBC 6.7 06/15/2020   HGB 14.1 06/15/2020   HCT 43.5 06/15/2020   MCV 94.2 06/15/2020   PLT 348 06/15/2020   Lab Results  Component Value Date   NA 137 06/15/2020   K 3.9 06/15/2020   CO2 23 06/15/2020   GLUCOSE 120 (H) 06/15/2020   BUN 13 06/15/2020   CREATININE 0.91 06/15/2020   BILITOT 1.2 06/15/2020   ALKPHOS 82 06/15/2020   AST 28 06/15/2020   ALT 43 06/15/2020   PROT 7.3 06/15/2020   ALBUMIN 4.4 06/15/2020   CALCIUM 9.9 06/15/2020   ANIONGAP 12 06/15/2020   GFR 70.63 11/28/2019   Lab Results  Component Value Date   CHOL 193 11/28/2019   Lab Results  Component Value Date   HDL 43.40 11/28/2019   Lab Results  Component Value Date   LDLCALC 54 05/15/2017   Lab Results  Component Value Date   TRIG 334.0 (H) 11/28/2019   Lab Results  Component Value Date   CHOLHDL 4 11/28/2019   Lab Results  Component Value Date   HGBA1C 5.5 11/28/2019      Assessment & Plan:   Patient relates several week history of left sore throat symptoms.  Her exam is unremarkable.  She does not have any red flags such as fever, weight loss, adenopathy, peritonsillar abscess, thrush, etc.  No evidence for ear infection.  Question referred pain.  She does have some left TMJ tenderness.  -We will set up referral to dental specialist who specializes in TMJ  No orders of the defined types were placed in this  encounter.   Follow-up: No follow-ups on file.    Carolann Littler, MD

## 2020-08-14 ENCOUNTER — Telehealth (INDEPENDENT_AMBULATORY_CARE_PROVIDER_SITE_OTHER): Payer: Medicare Other | Admitting: Family Medicine

## 2020-08-14 ENCOUNTER — Encounter: Payer: Self-pay | Admitting: Family Medicine

## 2020-08-14 VITALS — HR 83 | Temp 97.9°F | Ht 60.0 in

## 2020-08-14 DIAGNOSIS — J989 Respiratory disorder, unspecified: Secondary | ICD-10-CM

## 2020-08-14 DIAGNOSIS — J069 Acute upper respiratory infection, unspecified: Secondary | ICD-10-CM

## 2020-08-14 DIAGNOSIS — R059 Cough, unspecified: Secondary | ICD-10-CM

## 2020-08-14 MED ORDER — HYDROCODONE-HOMATROPINE 5-1.5 MG/5ML PO SYRP: 5.0000 mL | ORAL_SOLUTION | Freq: Two times a day (BID) | ORAL | 0 refills | Status: AC | PRN

## 2020-08-14 MED ORDER — BENZONATATE 100 MG PO CAPS: 200.0000 mg | ORAL_CAPSULE | Freq: Two times a day (BID) | ORAL | 0 refills | Status: DC | PRN

## 2020-08-14 MED ORDER — ALBUTEROL SULFATE HFA 108 (90 BASE) MCG/ACT IN AERS: 2.0000 | INHALATION_SPRAY | Freq: Four times a day (QID) | RESPIRATORY_TRACT | 1 refills | Status: DC | PRN

## 2020-08-14 MED ORDER — FLUTICASONE-SALMETEROL 250-50 MCG/DOSE IN AEPB: 1.0000 | INHALATION_SPRAY | Freq: Two times a day (BID) | RESPIRATORY_TRACT | 3 refills | Status: DC

## 2020-08-14 MED ORDER — FLUTICASONE PROPIONATE 50 MCG/ACT NA SUSP: 2.0000 | Freq: Every day | NASAL | 1 refills | Status: DC

## 2020-08-14 NOTE — Progress Notes (Signed)
Virtual Visit via Video Note I connected with Ms Pautler on 01/01/20 by a video enabled telemedicine application and verified that I am speaking with the correct person using two identifiers.  Location patient: home Location provider:work office Persons participating in the virtual visit: patient, provider  I discussed the limitations of evaluation and management by telemedicine and the availability of in person appointments. The patient expressed understanding and agreed to proceed.  Chief Complaint  Patient presents with  . chest cold    HPI: Ms. Mirkin is a 59 yo female with hx of chronic fatigue syndrome, schizophrenic/bipolar type disorder, IBS, and chronic headaches c/o 2 days of respiratory symptoms. Fatigue, chills, body aches, nasal congestion, rhinorrhea, and postnasal drainage. Negative for fever, sore throat, anosmia, and ageusia. + Headache, no more than usual. Nonproductive cough and mild wheezing. Cough is not interfering with his sleep. She requesting cough syrup prescription.  Negative for dyspnea, abdominal pain, nausea, vomiting, changes in bowel habits, urinary symptoms, or a skin rash. Episode of diarrhea, not uncommon given her history of IBS.  Her wife has been sick with similar symptoms, she is now feeling better. Reporting negative COVID-19 test. She has taking OTC Alka-Seltzer cold and sinus.  In the past she has had episodes of wheezing and dyspnea of exertion, she is no longer on Advair. She still has some albuterol, last use about 2 days ago, it is expired.  ROS: See pertinent positives and negatives per HPI.  Past Medical History:  Diagnosis Date  . Anxiety   . Bipolar 1 disorder (HCC)    HX PSYCHOSIS  . Complication of anesthesia    POST AGGITATION  . Frequency of urination   . Headache(784.0)   . History of duodenal ulcer   . Hyperlipidemia   . IBS (irritable bowel syndrome)   . Nocturia   . Pelvic pain   . Personality disorder ([place])     W/ BORDERLINE FEATURES  . Urgency of urination     Past Surgical History:  Procedure Laterality Date  . CARDIAC CATHETERIZATION  10-09-1999  DR KATZ   NORMAL LVF/  NORMAL RCA/  NO CRITICAL DISEASE LEFT CORONARY SYSTEM  . CARDIOVASCULAR STRESS TEST  01-23-2011   NORMAL NUCLEAR STUDY/ NO ISCHEMIA/ EF 81%  . CYSTOSCOPY WITH BIOPSY N/A 06/03/2013   Procedure: CYSTOSCOPY WITH BIOPSY  INSTILLATION OF MARCAINE AND PYRIDIUM;  Surgeon: Hanley Ben, MD;  Location: [facility];  Service: Urology;  Laterality: N/A;  . LAPAROSCOPIC CHOLECYSTECTOMY  11-08-2001  . LAPAROSCOPIC LEFT SALPINGOOPHORECTOMY AND LYSYS ADHESIONS  03-10-2002  . LAPAROSCOPIC REMOVAL OVARY REMNANT  2003   [place]  . LAPAROSCOPY N/A 12/14/2012   Procedure: LAPAROSCOPY DIAGNOSTIC;  Surgeon: Stark Klein, MD;  Location: [facility];  Service: General;  Laterality: N/A;  . TOTAL ABDOMINAL HYSTERECTOMY  2000   W/ RIGHT SALPINGOOPHORECTOMY  . TRANSTHORACIC ECHOCARDIOGRAM  10-13-2010   NORMAL LVF/  EF 55-60%    Family History  Problem Relation Age of Onset  . Sleep apnea Father   . Depression Father   . Alcohol abuse Father   . Hypertension Father   . Migraines Sister        headaches  . Anxiety disorder Sister   . Other Mother        MAC infection  . Anxiety disorder Mother   . Dementia Mother   . Heart disease Paternal Grandmother   . Hyperlipidemia Paternal Grandmother   . Alcohol abuse Paternal Grandfather   . Diabetes Neg  Hx     Social History   Socioeconomic History  . Marital status: Married    Spouse name: Not on file  . Number of children: 0  . Years of education: college  . Highest education level: Not on file  Occupational History  . 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 Occupation: disability  Tobacco Use  . Smoking status: Former Smoker    Packs/day: 0.01    Years: 1.00    Pack years: 0.01    Types: Cigarettes    Quit date: 1980    Years since quitting: 42.0  . Smokeless  tobacco: Never Used  . Tobacco comment: ONLY SMOKED FOR 6 MONTHS --  QUIT  YRS AGO  Vaping Use  . Vaping Use: Never used  Substance and Sexual Activity  . Alcohol use: Yes    Comment: occ  . Drug use: No  . Sexual activity: Not on file  Other Topics Concern  . 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    no tobacco   Caffeine Use: very little, three times a week   Social Determinants of Health   Financial Resource Strain: Not on file  Food Insecurity: Not on file  Transportation Needs: Not on file  Physical Activity: Not on file  Stress: Not on file  Social Connections: Not on file  Intimate Partner Violence: Not on file   No current facility-administered medications for this visit.  Current Outpatient Medications:  .  amoxicillin (AMOXIL) 500 MG capsule, Take 1 capsule (500 mg total) by mouth 2 (two) times daily., Disp: 20 capsule, Rfl: 0 .  benzonatate (TESSALON) 100 MG capsule, Take 2 capsules (200 mg total) by mouth 2 (two) times daily as needed for up to 10 days., Disp: 30 capsule, Rfl: 0 .  buPROPion (WELLBUTRIN XL) 150 MG 24 hr tablet, TAKE 1 TABLET BY MOUTH IN THE MORNING FOR 4 DAYS AND THEN TAKE 2 IN THE MORNING FOR 5 DAYS AND THEN TAKE 3 IN THE MORNING, Disp: , Rfl:  .  Cholecalciferol (VITAMIN D) 50 MCG (2000 UT) tablet, Take by mouth., Disp: , Rfl:  .  fenofibrate (TRICOR) 145 MG tablet, Take 1 tablet (145 mg total) by mouth daily., Disp: 90 tablet, Rfl: 1 .  gabapentin (NEURONTIN) 300 MG capsule, Take by mouth at bedtime. , Disp: , Rfl:  .  HYDROcodone-homatropine (HYCODAN) 5-1.5 MG/5ML syrup, Take 5 mLs by mouth every 12 (twelve) hours as needed for up to 10 days., Disp: 100 mL, Rfl: 0 .  hydrOXYzine (ATARAX/VISTARIL) 25 MG tablet, Take 1 tablet (25 mg total) by mouth 3 (three) times daily as needed for  itching., Disp: 30 tablet, Rfl: 0 .  lithium carbonate (LITHOBID) 300 MG CR tablet, Take 2 tablets (600 mg total) by mouth every evening. (Patient taking differently: Take 1 tablet by mouth in the morning and 2 tablets in the evening.), Disp: 60 tablet, Rfl: 0 .  LORazepam (ATIVAN) 0.5 MG tablet, Take 0.5 mg by mouth 2 (two) times daily. 19m at bedtime, Disp: , Rfl:  .  meclizine (ANTIVERT) 25 MG tablet, Take 1 tablet (25 mg total) by mouth 3 (three) times daily as needed for dizziness., Disp: 30 tablet, Rfl: 0 .  omeprazole (PRILOSEC) 40 MG capsule, SMARTSIG:1 Capsule(s) By Mouth Every Evening, Disp: ,  Rfl:  .  ondansetron (ZOFRAN) 4 MG tablet, Take 1 tablet (4 mg total) by mouth daily as needed for nausea., Disp: 10 tablet, Rfl: 0 .  albuterol (VENTOLIN HFA) 108 (90 Base) MCG/ACT inhaler, Inhale 2 puffs into the lungs every 6 (six) hours as needed for wheezing or shortness of breath., Disp: 18 g, Rfl: 1 .  fluticasone (FLONASE) 50 MCG/ACT nasal spray, Place 2 sprays into both nostrils daily., Disp: 16 g, Rfl: 1 .  Fluticasone-Salmeterol (ADVAIR DISKUS) 250-50 MCG/DOSE AEPB, Inhale 1 puff into the lungs 2 (two) times daily., Disp: 60 each, Rfl: 3  Facility-Administered Medications Ordered in Other Visits:  .  iohexol (OMNIPAQUE) 300 MG/ML solution 100 mL, 100 mL, Intravenous, Once PRN, Henderly, Britni A, PA-C  EXAM:  VITALS per patient if applicable:Pulse 83   Temp 97.9 F (36.6 C) (Skin) Comment (Src): forehead  Ht 5' (1.524 m)   SpO2 97%   BMI 30.86 kg/m   GENERAL: alert, oriented, appears well and in no acute distress  HEENT: atraumatic, conjunctiva clear, no obvious abnormalities on inspection of external nose and ears Nasal voice. NECK: normal movements of the head and neck  LUNGS: on inspection no signs of respiratory distress, breathing rate appears normal, no obvious gross SOB, gasping or wheezing  CV: no obvious cyanosis  MS: moves all visible extremities without  noticeable abnormality  PSYCH/NEURO: pleasant and cooperative, no obvious depression or anxiety, speech and thought processing grossly intact  ASSESSMENT AND PLAN:  Discussed the following assessment and plan:  URI, acute - Plan: fluticasone (FLONASE) 50 MCG/ACT nasal spray Symptoms suggests a viral etiology, symptomatic treatment recommended, so I do not think abx is needed at this time. Instructed to monitor for signs of complications, including new onset of fever among some, clearly instructed about warning signs.  F/U as needed.  Reactive airway disease without asthma - Plan: Fluticasone-Salmeterol (ADVAIR DISKUS) 250-50 MCG/DOSE AEPB, albuterol (VENTOLIN HFA) 108 (90 Base) MCG/ACT inhaler I do not think oral Prednisone is needed at this time. Albuterol inh 2 puff every 6 hours for a week then as needed for wheezing or shortness of breath.  Resumed Advair 250-50 mcg bid. Instructed about warning signs.  Cough - Plan: HYDROcodone-homatropine (HYCODAN) 5-1.5 MG/5ML syrup, benzonatate (TESSALON) 100 MG capsule I explained that cough and nasal congestion can last a few days and sometimes weeks after acute symptoms have resolved.  I discussed the assessment and treatment plan with the patient. Ms Soliman was provided an opportunity to ask questions and all were answered. She agreed with the plan and demonstrated an understanding of the instructions.  Return if symptoms worsen or fail to improve.  Kerensa Nicklas [name], MD

## 2020-08-16 ENCOUNTER — Telehealth: Payer: Self-pay | Admitting: Family Medicine

## 2020-08-16 NOTE — Telephone Encounter (Signed)
Patient wants to know if a Doctor can call in an antibiotic for her. She is not getting any better.  Please advise   [facility] [address] [place] - [place] [address] Phone:  [phone]  Fax:  [phone]

## 2020-08-16 NOTE — Telephone Encounter (Signed)
Patient is calling and stated that she was seen this week and symptoms haven't gotten any better. Patient stated that she is feeling more congested with a fever and wanted to know if the provider had any other recommendations, please advise. CB is [phone]

## 2020-08-17 ENCOUNTER — Encounter (HOSPITAL_BASED_OUTPATIENT_CLINIC_OR_DEPARTMENT_OTHER): Payer: Self-pay | Admitting: *Deleted

## 2020-08-17 ENCOUNTER — Emergency Department (HOSPITAL_BASED_OUTPATIENT_CLINIC_OR_DEPARTMENT_OTHER): Payer: Medicare Other

## 2020-08-17 ENCOUNTER — Inpatient Hospital Stay (HOSPITAL_BASED_OUTPATIENT_CLINIC_OR_DEPARTMENT_OTHER)
Admission: EM | Admit: 2020-08-17 | Discharge: 2020-08-20 | DRG: 871 | Disposition: A | Discharge status: Home / Self Care | Payer: Medicare Other | Attending: Internal Medicine | Admitting: Internal Medicine

## 2020-08-17 ENCOUNTER — Other Ambulatory Visit: Payer: Self-pay

## 2020-08-17 DIAGNOSIS — R296 Repeated falls: Secondary | ICD-10-CM | POA: Diagnosis present

## 2020-08-17 DIAGNOSIS — F603 Borderline personality disorder: Secondary | ICD-10-CM | POA: Diagnosis present

## 2020-08-17 DIAGNOSIS — E041 Nontoxic single thyroid nodule: Secondary | ICD-10-CM | POA: Diagnosis present

## 2020-08-17 DIAGNOSIS — E1165 Type 2 diabetes mellitus with hyperglycemia: Secondary | ICD-10-CM | POA: Diagnosis not present

## 2020-08-17 DIAGNOSIS — R6520 Severe sepsis without septic shock: Secondary | ICD-10-CM | POA: Diagnosis present

## 2020-08-17 DIAGNOSIS — G9341 Metabolic encephalopathy: Secondary | ICD-10-CM | POA: Diagnosis present

## 2020-08-17 DIAGNOSIS — E782 Mixed hyperlipidemia: Secondary | ICD-10-CM | POA: Diagnosis present

## 2020-08-17 DIAGNOSIS — J9601 Acute respiratory failure with hypoxia: Secondary | ICD-10-CM

## 2020-08-17 DIAGNOSIS — J189 Pneumonia, unspecified organism: Secondary | ICD-10-CM

## 2020-08-17 DIAGNOSIS — Z9079 Acquired absence of other genital organ(s): Secondary | ICD-10-CM

## 2020-08-17 DIAGNOSIS — R42 Dizziness and giddiness: Secondary | ICD-10-CM | POA: Diagnosis not present

## 2020-08-17 DIAGNOSIS — G4700 Insomnia, unspecified: Secondary | ICD-10-CM | POA: Diagnosis present

## 2020-08-17 DIAGNOSIS — Z83438 Family history of other disorder of lipoprotein metabolism and other lipidemia: Secondary | ICD-10-CM

## 2020-08-17 DIAGNOSIS — F419 Anxiety disorder, unspecified: Secondary | ICD-10-CM | POA: Diagnosis present

## 2020-08-17 DIAGNOSIS — R739 Hyperglycemia, unspecified: Secondary | ICD-10-CM

## 2020-08-17 DIAGNOSIS — Z87891 Personal history of nicotine dependence: Secondary | ICD-10-CM | POA: Diagnosis not present

## 2020-08-17 DIAGNOSIS — R519 Headache, unspecified: Secondary | ICD-10-CM | POA: Diagnosis not present

## 2020-08-17 DIAGNOSIS — R000 Tachycardia, unspecified: Secondary | ICD-10-CM | POA: Diagnosis not present

## 2020-08-17 DIAGNOSIS — K589 Irritable bowel syndrome without diarrhea: Secondary | ICD-10-CM | POA: Diagnosis present

## 2020-08-17 DIAGNOSIS — Z9049 Acquired absence of other specified parts of digestive tract: Secondary | ICD-10-CM | POA: Diagnosis not present

## 2020-08-17 DIAGNOSIS — K760 Fatty (change of) liver, not elsewhere classified: Secondary | ICD-10-CM | POA: Diagnosis present

## 2020-08-17 DIAGNOSIS — K219 Gastro-esophageal reflux disease without esophagitis: Secondary | ICD-10-CM | POA: Diagnosis present

## 2020-08-17 DIAGNOSIS — R918 Other nonspecific abnormal finding of lung field: Secondary | ICD-10-CM | POA: Diagnosis not present

## 2020-08-17 DIAGNOSIS — Z20822 Contact with and (suspected) exposure to covid-19: Secondary | ICD-10-CM | POA: Diagnosis present

## 2020-08-17 DIAGNOSIS — J181 Lobar pneumonia, unspecified organism: Secondary | ICD-10-CM | POA: Diagnosis not present

## 2020-08-17 DIAGNOSIS — Z818 Family history of other mental and behavioral disorders: Secondary | ICD-10-CM | POA: Diagnosis not present

## 2020-08-17 DIAGNOSIS — A419 Sepsis, unspecified organism: Secondary | ICD-10-CM | POA: Diagnosis present

## 2020-08-17 DIAGNOSIS — K3184 Gastroparesis: Secondary | ICD-10-CM | POA: Diagnosis present

## 2020-08-17 DIAGNOSIS — Z043 Encounter for examination and observation following other accident: Secondary | ICD-10-CM | POA: Diagnosis not present

## 2020-08-17 DIAGNOSIS — R0902 Hypoxemia: Secondary | ICD-10-CM | POA: Diagnosis present

## 2020-08-17 DIAGNOSIS — Z7951 Long term (current) use of inhaled steroids: Secondary | ICD-10-CM

## 2020-08-17 DIAGNOSIS — W19XXXA Unspecified fall, initial encounter: Secondary | ICD-10-CM | POA: Diagnosis present

## 2020-08-17 DIAGNOSIS — Z885 Allergy status to narcotic agent status: Secondary | ICD-10-CM

## 2020-08-17 DIAGNOSIS — R7401 Elevation of levels of liver transaminase levels: Secondary | ICD-10-CM | POA: Diagnosis not present

## 2020-08-17 DIAGNOSIS — Z90721 Acquired absence of ovaries, unilateral: Secondary | ICD-10-CM | POA: Diagnosis not present

## 2020-08-17 DIAGNOSIS — Z888 Allergy status to other drugs, medicaments and biological substances status: Secondary | ICD-10-CM

## 2020-08-17 DIAGNOSIS — F250 Schizoaffective disorder, bipolar type: Secondary | ICD-10-CM | POA: Diagnosis present

## 2020-08-17 DIAGNOSIS — R7989 Other specified abnormal findings of blood chemistry: Secondary | ICD-10-CM | POA: Diagnosis not present

## 2020-08-17 DIAGNOSIS — J984 Other disorders of lung: Secondary | ICD-10-CM | POA: Diagnosis not present

## 2020-08-17 DIAGNOSIS — Z79899 Other long term (current) drug therapy: Secondary | ICD-10-CM

## 2020-08-17 HISTORY — DX: Pneumonia, unspecified organism: J18.9

## 2020-08-17 LAB — LITHIUM LEVEL: Lithium Lvl: 0.49 mmol/L — ABNORMAL LOW (ref 0.60–1.20)

## 2020-08-17 LAB — URINALYSIS, ROUTINE W REFLEX MICROSCOPIC
Bilirubin Urine: NEGATIVE
Glucose, UA: NEGATIVE mg/dL
Hgb urine dipstick: NEGATIVE
Ketones, ur: NEGATIVE mg/dL
Leukocytes,Ua: NEGATIVE
Nitrite: NEGATIVE
Protein, ur: NEGATIVE mg/dL
Specific Gravity, Urine: 1.005 (ref 1.005–1.030)
pH: 7.5 (ref 5.0–8.0)

## 2020-08-17 LAB — CBC WITH DIFFERENTIAL/PLATELET
Abs Immature Granulocytes: 0.09 10*3/uL — ABNORMAL HIGH (ref 0.00–0.07)
Basophils Absolute: 0 10*3/uL (ref 0.0–0.1)
Basophils Relative: 0 %
Eosinophils Absolute: 0.1 10*3/uL (ref 0.0–0.5)
Eosinophils Relative: 0 %
HCT: 41.8 % (ref 36.0–46.0)
Hemoglobin: 13.5 g/dL (ref 12.0–15.0)
Immature Granulocytes: 1 %
Lymphocytes Relative: 3 %
Lymphs Abs: 0.4 10*3/uL — ABNORMAL LOW (ref 0.7–4.0)
MCH: 31.5 pg (ref 26.0–34.0)
MCHC: 32.3 g/dL (ref 30.0–36.0)
MCV: 97.4 fL (ref 80.0–100.0)
Monocytes Absolute: 0.7 10*3/uL (ref 0.1–1.0)
Monocytes Relative: 5 %
Neutro Abs: 11.8 10*3/uL — ABNORMAL HIGH (ref 1.7–7.7)
Neutrophils Relative %: 91 %
Platelets: 264 10*3/uL (ref 150–400)
RBC: 4.29 MIL/uL (ref 3.87–5.11)
RDW: 12.6 % (ref 11.5–15.5)
WBC: 13.1 10*3/uL — ABNORMAL HIGH (ref 4.0–10.5)
nRBC: 0 % (ref 0.0–0.2)

## 2020-08-17 LAB — COMPREHENSIVE METABOLIC PANEL
ALT: 43 U/L (ref 0–44)
AST: 30 U/L (ref 15–41)
Albumin: 4.3 g/dL (ref 3.5–5.0)
Alkaline Phosphatase: 91 U/L (ref 38–126)
Anion gap: 11 (ref 5–15)
BUN: 8 mg/dL (ref 6–20)
CO2: 20 mmol/L — ABNORMAL LOW (ref 22–32)
Calcium: 9.9 mg/dL (ref 8.9–10.3)
Chloride: 106 mmol/L (ref 98–111)
Creatinine, Ser: 0.94 mg/dL (ref 0.44–1.00)
GFR, Estimated: >60 mL/min (ref >60)
Glucose, Bld: 219 mg/dL — ABNORMAL HIGH (ref 70–99)
Potassium: 3.9 mmol/L (ref 3.5–5.1)
Sodium: 137 mmol/L (ref 135–145)
Total Bilirubin: 1 mg/dL (ref 0.3–1.2)
Total Protein: 7.8 g/dL (ref 6.5–8.1)

## 2020-08-17 LAB — LACTIC ACID, PLASMA
Lactic Acid, Venous: 1.7 mmol/L (ref 0.5–1.9)
Lactic Acid, Venous: 2.1 mmol/L (ref 0.5–1.9)

## 2020-08-17 LAB — TROPONIN I (HIGH SENSITIVITY)
Troponin I (High Sensitivity): 3 ng/L (ref <18)
Troponin I (High Sensitivity): 3 ng/L (ref <18)

## 2020-08-17 LAB — RAPID URINE DRUG SCREEN, HOSP PERFORMED
Amphetamines: NOT DETECTED
Barbiturates: NOT DETECTED
Benzodiazepines: NOT DETECTED
Cocaine: NOT DETECTED
Opiates: NOT DETECTED
Tetrahydrocannabinol: NOT DETECTED

## 2020-08-17 LAB — LIPASE, BLOOD: Lipase: 21 U/L (ref 11–51)

## 2020-08-17 LAB — SARS CORONAVIRUS 2 (TAT 6-24 HRS): SARS Coronavirus 2: NEGATIVE

## 2020-08-17 MED ORDER — GUAIFENESIN-DM 100-10 MG/5ML PO SYRP
5.0000 mL | ORAL_SOLUTION | ORAL | Status: DC | PRN
  Administered 2020-08-18 – 2020-08-20 (×7): 5 mL via ORAL
  Filled 2020-08-17 (×7): qty 5

## 2020-08-17 MED ORDER — SODIUM CHLORIDE 0.9 % IV SOLN: INTRAVENOUS | Status: DC

## 2020-08-17 MED ORDER — SODIUM CHLORIDE 0.9 % IV SOLN: INTRAVENOUS | Status: AC

## 2020-08-17 MED ORDER — SODIUM CHLORIDE 0.9 % IV BOLUS
1000.0000 mL | Freq: Once | INTRAVENOUS | Status: AC
  Administered 2020-08-17: 1000 mL via INTRAVENOUS

## 2020-08-17 MED ORDER — SODIUM CHLORIDE 0.9 % IV SOLN
500.0000 mg | INTRAVENOUS | Status: DC
  Administered 2020-08-18: 500 mg via INTRAVENOUS
  Filled 2020-08-17 (×2): qty 500

## 2020-08-17 MED ORDER — SODIUM CHLORIDE 0.9 % IV SOLN
1.0000 g | Freq: Once | INTRAVENOUS | Status: AC
  Administered 2020-08-17: 1 g via INTRAVENOUS
  Filled 2020-08-17: qty 10

## 2020-08-17 MED ORDER — INSULIN ASPART 100 UNIT/ML ~~LOC~~ SOLN: 0.0000 [IU] | Freq: Three times a day (TID) | SUBCUTANEOUS | Status: DC

## 2020-08-17 MED ORDER — ACETAMINOPHEN 325 MG PO TABS
650.0000 mg | ORAL_TABLET | Freq: Four times a day (QID) | ORAL | Status: DC | PRN
  Administered 2020-08-18 – 2020-08-19 (×3): 650 mg via ORAL
  Filled 2020-08-17 (×2): qty 2

## 2020-08-17 MED ORDER — ACETAMINOPHEN 325 MG PO TABS
650.0000 mg | ORAL_TABLET | Freq: Once | ORAL | Status: AC
  Administered 2020-08-17: 650 mg via ORAL
  Filled 2020-08-17: qty 2

## 2020-08-17 MED ORDER — IOHEXOL 300 MG/ML  SOLN
100.0000 mL | Freq: Once | INTRAMUSCULAR | Status: AC | PRN
  Administered 2020-08-17: 100 mL via INTRAVENOUS

## 2020-08-17 MED ORDER — ONDANSETRON HCL 4 MG/2ML IJ SOLN
4.0000 mg | Freq: Four times a day (QID) | INTRAMUSCULAR | Status: DC | PRN
  Administered 2020-08-18: 4 mg via INTRAVENOUS
  Filled 2020-08-17: qty 2

## 2020-08-17 MED ORDER — INSULIN ASPART 100 UNIT/ML ~~LOC~~ SOLN: 0.0000 [IU] | Freq: Every day | SUBCUTANEOUS | Status: DC

## 2020-08-17 MED ORDER — ONDANSETRON HCL 4 MG/2ML IJ SOLN
4.0000 mg | Freq: Once | INTRAMUSCULAR | Status: AC
  Administered 2020-08-17: 4 mg via INTRAVENOUS
  Filled 2020-08-17: qty 2

## 2020-08-17 MED ORDER — SODIUM CHLORIDE 0.9 % IV SOLN
2.0000 g | INTRAVENOUS | Status: DC
  Administered 2020-08-18 – 2020-08-19 (×2): 2 g via INTRAVENOUS
  Filled 2020-08-17 (×2): qty 20

## 2020-08-17 MED ORDER — SODIUM CHLORIDE 0.9 % IV SOLN: Freq: Once | INTRAVENOUS | Status: DC

## 2020-08-17 MED ORDER — ACETAMINOPHEN 650 MG RE SUPP: 650.0000 mg | Freq: Four times a day (QID) | RECTAL | Status: DC | PRN

## 2020-08-17 MED ORDER — POLYETHYLENE GLYCOL 3350 17 G PO PACK: 17.0000 g | PACK | Freq: Every day | ORAL | Status: DC | PRN

## 2020-08-17 MED ORDER — ONDANSETRON HCL 4 MG PO TABS: 4.0000 mg | ORAL_TABLET | Freq: Four times a day (QID) | ORAL | Status: DC | PRN

## 2020-08-17 MED ORDER — SODIUM CHLORIDE 0.9 % IV SOLN
500.0000 mg | Freq: Once | INTRAVENOUS | Status: AC
  Administered 2020-08-17: 500 mg via INTRAVENOUS
  Filled 2020-08-17: qty 500

## 2020-08-17 MED ORDER — ENOXAPARIN SODIUM 40 MG/0.4ML ~~LOC~~ SOLN
40.0000 mg | Freq: Every day | SUBCUTANEOUS | Status: DC
  Administered 2020-08-18 – 2020-08-19 (×2): 40 mg via SUBCUTANEOUS
  Filled 2020-08-17 (×3): qty 0.4

## 2020-08-17 MED ORDER — BENZONATATE 100 MG PO CAPS
200.0000 mg | ORAL_CAPSULE | Freq: Once | ORAL | Status: AC
  Administered 2020-08-17: 200 mg via ORAL
  Filled 2020-08-17: qty 2

## 2020-08-17 MED ORDER — MECLIZINE HCL 25 MG PO TABS
25.0000 mg | ORAL_TABLET | Freq: Once | ORAL | Status: AC
  Administered 2020-08-17: 25 mg via ORAL
  Filled 2020-08-17: qty 1

## 2020-08-17 MED ORDER — ALBUTEROL SULFATE HFA 108 (90 BASE) MCG/ACT IN AERS: 2.0000 | INHALATION_SPRAY | RESPIRATORY_TRACT | Status: DC | PRN

## 2020-08-17 NOTE — ED Notes (Signed)
Patient transported to CT 

## 2020-08-17 NOTE — ED Provider Notes (Addendum)
[name] Provider Note   [name]: [id number] Arrival date & time: 08/17/20  8638    History Chief Complaint  Patient presents with  . Cough    [name] is a 59 y.o. female with past medical history significant for bipolar on lithium, personality disorder, chronic, recurrent headaches who presents for evaluation of generalized aching and weakness.  Patient significant other states patient had a fall yesterday.  Initially patient denied told wife that she did not hit her head.  Wife states she has had increased difficulty with ambulation.  States she is taking small, frequent steps.  Patient has had emesis at home as well as one emesis episode in triage.  NBNB.  Patient states she had a headache yesterday and was slightly lightheaded however no headache currently.  She is vaccinated against COVID.  She has been having a cough, scratchy throat.  Significant other stated that patient seems more sleepy as well as slightly confused.  States patient went to pushbuttons in her car which were not what she was supposed to be pushing.  She denies any fever, sudden onset thunderclap headache, neck pain, neck stiffness, paresthesias, motor weakness, facial droop, chest pain, shortness of breath, diarrhea, dysuria, unilateral weakness. Intermittent abd cramping. Does not radiate. Denies additional aggravating or relieving factors.  No known Covid exposures.  Patient denies any back pain, saddle paresthesia, bowel or bladder incontinence. Wife states patient had increasing difficulty walking and had to be placed in a wheel chair to come to the ED. Similar episode a few months ago. Had increasing falls on Latuda however no longer on this medication. Hx of lithium toxicity, no recent changes in dose. Hx of vertigo. States feels similar.  No numbness or tinging, no unilateral weakness, no acceding weakness. No recent vaccines, viral illnesses.  History obtained from patient and  past medical records as well as wife.  No interpreter was used.  No recent medication changes.  LKN yesterday evening around 8 PM  Has been seen by Neuro in the past for weakness, gait instability.  Last seen 09/05/19 by Dr. Posey Pronto with [facility] Per note " patient with chronic fatigue and has been extensively evaluated by neurology which has been negative for primary neurological explanation of symptoms."   HPI     Past Medical History:  Diagnosis Date  . Anxiety   . Bipolar 1 disorder (HCC)    HX PSYCHOSIS  . Complication of anesthesia    POST AGGITATION  . Frequency of urination   . Headache(784.0)   . History of duodenal ulcer   . Hyperlipidemia   . IBS (irritable bowel syndrome)   . Nocturia   . Pelvic pain   . Personality disorder ([place])    W/ BORDERLINE FEATURES  . Urgency of urination     Patient Active Problem List   Diagnosis Date Noted  . PNA (pneumonia) 08/17/2020  . Change in bowel habit 08/06/2020  . Diverticular disease of colon 08/06/2020  . Fecal urgency 08/06/2020  . Flatulence, eructation and gas pain 08/06/2020  . Gastroesophageal reflux disease 08/06/2020  . Irritable bowel syndrome 08/06/2020  . Left upper quadrant pain 08/06/2020  . Periumbilical pain 17/71/1657  . Pruritus ani 08/06/2020  . Rectal bleeding 08/06/2020  . Subacute bronchitis 07/10/2018  . Chronic fatigue 06/21/2017  . Hyperlipidemia, mixed 05/12/2017  . Insomnia 05/12/2017  . Multinodular goiter 05/12/2017  . Hoarseness of voice 05/12/2017  . Thyroid nodule 02/02/2017  . Hyperprolactinemia ([place]) 06/26/2015  .  Schizoaffective disorder, bipolar type ([place]) 06/21/2015  . Borderline personality disorder ([place]) 06/20/2015  . Autonomic dysfunction 01/11/2015  . Bipolar 1 disorder, manic, moderate ([place]) 01/11/2015  . Nondiabetic gastroparesis 01/11/2015  . Lithium toxicity 05/01/2014  . Leukocytosis 05/01/2014  . Costochondritis 02/14/2014  . Renal stone 02/13/2014  .  Fatty liver disease, nonalcoholic 40/34/7425  . Early satiety 04/27/2013  . Abnormal LFTs 04/06/2013  . Dysuria 02/15/2013  . Medication side effect 01/07/2013  . Lumpy breasts 12/20/2012  . Lump of breast, left 12/20/2012  . Loss of weight 11/08/2012  . Food aversion 11/08/2012  . Constipation 05/17/2012  . Right ear pain 02/16/2012  . Jaw pain 02/13/2012  . Vitamin D deficiency 09/23/2011  . Loose stools 09/23/2011  . Double vision 03/15/2011  . Hyperglycemia 01/10/2011  . Dyspnea on effort 01/10/2011  . Palpitations 12/08/2010  . ENDOMETRIOSIS 05/11/2008  . DEFICIENCY, VITAMIN D NOS 06/11/2007  . PLANTAR FASCIITIS 06/11/2007  . Allergic rhinitis 03/26/2007    Past Surgical History:  Procedure Laterality Date  . CARDIAC CATHETERIZATION  10-09-1999  DR KATZ   NORMAL LVF/  NORMAL RCA/  NO CRITICAL DISEASE LEFT CORONARY SYSTEM  . CARDIOVASCULAR STRESS TEST  01-23-2011   NORMAL NUCLEAR STUDY/ NO ISCHEMIA/ EF 81%  . CYSTOSCOPY WITH BIOPSY N/A 06/03/2013   Procedure: CYSTOSCOPY WITH BIOPSY  INSTILLATION OF MARCAINE AND PYRIDIUM;  Surgeon: Hanley Ben, MD;  Location: [facility];  Service: Urology;  Laterality: N/A;  . LAPAROSCOPIC CHOLECYSTECTOMY  11-08-2001  . LAPAROSCOPIC LEFT SALPINGOOPHORECTOMY AND LYSYS ADHESIONS  03-10-2002  . LAPAROSCOPIC REMOVAL OVARY REMNANT  2003   [place]  . LAPAROSCOPY N/A 12/14/2012   Procedure: LAPAROSCOPY DIAGNOSTIC;  Surgeon: Stark Klein, MD;  Location: [facility];  Service: General;  Laterality: N/A;  . TOTAL ABDOMINAL HYSTERECTOMY  2000   W/ RIGHT SALPINGOOPHORECTOMY  . TRANSTHORACIC ECHOCARDIOGRAM  10-13-2010   NORMAL LVF/  EF 55-60%     OB History   No obstetric history on file.     Family History  Problem Relation Age of Onset  . Sleep apnea Father   . Depression Father   . Alcohol abuse Father   . Hypertension Father   . Migraines Sister        headaches  . Anxiety disorder Sister   . Other Mother         MAC infection  . Anxiety disorder Mother   . Dementia Mother   . Heart disease Paternal Grandmother   . Hyperlipidemia Paternal Grandmother   . Alcohol abuse Paternal Grandfather   . Diabetes Neg Hx     Social History   Tobacco Use  . Smoking status: Former Smoker    Packs/day: 0.01    Years: 1.00    Pack years: 0.01    Types: Cigarettes    Quit date: 1980    Years since quitting: 42.0  . Smokeless tobacco: Never Used  . Tobacco comment: ONLY SMOKED FOR 6 MONTHS --  QUIT  YRS AGO  Vaping Use  . Vaping Use: Never used  Substance Use Topics  . Alcohol use: Yes    Comment: occ  . Drug use: No    Home Medications Prior to Admission medications   Medication Sig Start Date End Date Taking? Authorizing Provider  albuterol (VENTOLIN HFA) 108 (90 Base) MCG/ACT inhaler Inhale 2 puffs into the lungs every 6 (six) hours as needed for wheezing or shortness of breath. 08/14/20 08/14/21  [name], [name], MD  amoxicillin (  AMOXIL) 500 MG capsule Take 1 capsule (500 mg total) by mouth 2 (two) times daily. 07/19/20   Lucretia Kern, DO  benzonatate (TESSALON) 100 MG capsule Take 2 capsules (200 mg total) by mouth 2 (two) times daily as needed for up to 10 days. 08/14/20 08/24/20  [name], [name], MD  buPROPion (WELLBUTRIN XL) 150 MG 24 hr tablet TAKE 1 TABLET BY MOUTH IN THE MORNING FOR 4 DAYS AND THEN TAKE 2 IN THE MORNING FOR 5 DAYS AND THEN TAKE 3 IN THE MORNING 11/03/19   [provider]  Cholecalciferol (VITAMIN D) 50 MCG (2000 UT) tablet Take by mouth.    [provider]  fenofibrate (TRICOR) 145 MG tablet Take 1 tablet (145 mg total) by mouth daily. 12/06/19   [name], [name], MD  fluticasone [facility]) 50 MCG/ACT nasal spray Place 2 sprays into both nostrils daily. 08/14/20   [name], [name], MD  Fluticasone-Salmeterol (ADVAIR DISKUS) 250-50 MCG/DOSE AEPB Inhale 1 puff into the lungs 2 (two) times daily. 08/14/20   [name], [name], MD  gabapentin (NEURONTIN) 300 MG capsule  Take by mouth at bedtime.     [provider]  HYDROcodone-homatropine (HYCODAN) 5-1.5 MG/5ML syrup Take 5 mLs by mouth every 12 (twelve) hours as needed for up to 10 days. 08/14/20 08/24/20  [name], [name], MD  hydrOXYzine (ATARAX/VISTARIL) 25 MG tablet Take 1 tablet (25 mg total) by mouth 3 (three) times daily as needed for itching. 04/13/20   Billie Ruddy, MD  lithium carbonate (LITHOBID) 300 MG CR tablet Take 2 tablets (600 mg total) by mouth every evening. Patient taking differently: Take 1 tablet by mouth in the morning and 2 tablets in the evening. 06/27/15   [place], Elyse Jarvis, FNP  LORazepam (ATIVAN) 0.5 MG tablet Take 0.5 mg by mouth 2 (two) times daily. 66m at bedtime    [provider]  meclizine (ANTIVERT) 25 MG tablet Take 1 tablet (25 mg total) by mouth 3 (three) times daily as needed for dizziness. 06/15/20   STruddie Hidden MD  omeprazole (PRILOSEC) 40 MG capsule SMARTSIG:1 Capsule(s) By Mouth Every Evening 03/21/20   [provider]  ondansetron (ZOFRAN) 4 MG tablet Take 1 tablet (4 mg total) by mouth daily as needed for nausea. 07/19/20   KLucretia Kern DO    Allergies    Morphine and related, Ambien [zolpidem], Duloxetine, Latuda [lurasidone hcl], and Morphine  Review of Systems   Review of Systems  Constitutional: Positive for activity change, appetite change, chills and fatigue.  HENT: Positive for congestion, postnasal drip and rhinorrhea. Negative for sore throat, trouble swallowing and voice change.   Respiratory: Positive for cough. Negative for apnea, choking, chest tightness, shortness of breath, wheezing and stridor.   Cardiovascular: Negative.   Gastrointestinal: Positive for nausea and vomiting. Negative for abdominal distention, abdominal pain, anal bleeding, blood in stool, constipation, diarrhea and rectal pain.  Genitourinary: Negative.   Musculoskeletal: Negative.   Skin: Negative.   Neurological: Positive for weakness  (Generalized), light-headedness (Yesterday, resolved) and headaches (Yesterday, resolved). Negative for dizziness, tremors, seizures, syncope, facial asymmetry, speech difficulty and numbness.  All other systems reviewed and are negative.   Physical Exam Updated Vital Signs BP 109/67   Pulse 90   Temp 98.9 F (37.2 C) (Oral)   Resp 19   Ht 5' (1.524 m)   Wt 65.8 kg   SpO2 92%   BMI 28.32 kg/m   Physical Exam Vitals and nursing note  reviewed.  Constitutional:      General: She is not in acute distress.    Appearance: Normal appearance. She is well-developed and well-nourished. She is not toxic-appearing or diaphoretic.  HENT:     Head: Normocephalic and atraumatic.     Nose: Nose normal.     Mouth/Throat:     Mouth: Mucous membranes are dry.  Eyes:     Extraocular Movements: Extraocular movements intact.     Conjunctiva/sclera: Conjunctivae normal.     Pupils: Pupils are equal, round, and reactive to light.     Comments: No horizontal, vertical or rotational nystagmus   Neck:     Trachea: Trachea and phonation normal.     Comments: Full active and passive ROM without pain No midline or paraspinal tenderness No nuchal rigidity or meningeal signs  Cardiovascular:     Rate and Rhythm: Normal rate.     Pulses: Normal pulses and intact distal pulses.     Heart sounds: Normal heart sounds.  Pulmonary:     Effort: Pulmonary effort is normal. No respiratory distress.     Breath sounds: Normal breath sounds and air entry.     Comments: Clear to auscultation bilateral without wheeze, rhonchi or rales Abdominal:     General: Bowel sounds are normal. There is no distension.     Tenderness: There is no abdominal tenderness. There is no right CVA tenderness, left CVA tenderness, guarding or rebound.     Hernia: No hernia is present.  Musculoskeletal:        General: Normal range of motion.     Cervical back: Full passive range of motion without pain and normal range of motion.      Comments: Moves all 4 extremities at difficulty.  No bony tenderness.  Compartments soft.  Skin:    General: Skin is warm and dry.  Neurological:     Mental Status: She is alert and oriented to person, place, and time.     Comments: Mental Status:  Alert, oriented, thought content appropriate. Speech fluent without evidence of aphasia. Able to follow 2 step commands without difficulty.  Cranial Nerves:  II:  Peripheral visual fields grossly normal, pupils equal, round, reactive to light III,IV, VI: ptosis not present, extra-ocular motions intact bilaterally  V,VII: smile symmetric, facial light touch sensation equal VIII: hearing grossly normal bilaterally  IX,X: midline uvula rise  XI: bilateral shoulder shrug equal and strong XII: midline tongue extension  Motor:  5/5 in upper and lower extremities bilaterally including strong and equal grip strength and dorsiflexion/plantar flexion Sensory: Pinprick and light touch normal in all extremities.  Deep Tendon Reflexes: 2+ and symmetric  Cerebellar: normal finger-to-nose with bilateral upper extremities Gait: normal gait and balance CV: distal pulses palpable throughout    Psychiatric:        Mood and Affect: Mood and affect normal.     Comments: Flat affect    ED Results / Procedures / Treatments   Labs (all labs ordered are listed, but only abnormal results are displayed) Labs Reviewed  CBC WITH DIFFERENTIAL/PLATELET - Abnormal; Notable for the following components:      Result Value   WBC 13.1 (*)    Neutro Abs 11.8 (*)    Lymphs Abs 0.4 (*)    Abs Immature Granulocytes 0.09 (*)    All other components within normal limits  COMPREHENSIVE METABOLIC PANEL - Abnormal; Notable for the following components:   CO2 20 (*)    Glucose, Bld 219 (*)  All other components within normal limits  LITHIUM LEVEL - Abnormal; Notable for the following components:   Lithium Lvl 0.49 (*)    All other components within normal limits  LACTIC  ACID, PLASMA - Abnormal; Notable for the following components:   Lactic Acid, Venous 2.1 (*)    All other components within normal limits  SARS CORONAVIRUS 2 (TAT 6-24 HRS)  CULTURE, BLOOD (SINGLE)  LIPASE, BLOOD  URINALYSIS, ROUTINE W REFLEX MICROSCOPIC  RAPID URINE DRUG SCREEN, HOSP PERFORMED  LACTIC ACID, PLASMA  TROPONIN I (HIGH SENSITIVITY)  TROPONIN I (HIGH SENSITIVITY)    EKG EKG Interpretation  Date/Time:  Friday August 17 2020 10:31:21 EST Ventricular Rate:  101 PR Interval:    QRS Duration: 90 QT Interval:  333 QTC Calculation: 432 R Axis:   60 Text Interpretation: Sinus tachycardia Borderline T abnormalities, diffuse leads Confirmed by Quintella Reichert [phone]) on 08/17/2020 11:00:28 AM   Radiology DG Pelvis 1-2 Views  Result Date: 08/17/2020 CLINICAL DATA:  Status post fall unable to walk. EXAM: PELVIS - 1-2 VIEW COMPARISON:  July 03, 2007 FINDINGS: There is no evidence of pelvic fracture or diastasis. No pelvic bone lesions are seen. IMPRESSION: Negative. Electronically Signed   By: Abelardo Diesel M.D.   On: 08/17/2020 11:15   CT Head Wo Contrast  Result Date: 08/17/2020 CLINICAL DATA:  Headache and dizziness after fall 6 months ago. EXAM: CT HEAD WITHOUT CONTRAST CT CERVICAL SPINE WITHOUT CONTRAST TECHNIQUE: Multidetector CT imaging of the head and cervical spine was performed following the standard protocol without intravenous contrast. Multiplanar CT image reconstructions of the cervical spine were also generated. COMPARISON:  June 15, 2020. FINDINGS: CT HEAD FINDINGS Brain: Mild chronic ischemic white matter disease. No mass effect or midline shift is noted. Ventricular size is within normal limits. There is no evidence of mass lesion, hemorrhage or acute infarction. Vascular: No hyperdense vessel or unexpected calcification. Skull: Normal. Negative for fracture or focal lesion. Sinuses/Orbits: Mild bilateral ethmoid and maxillary sinusitis. Other: None. CT  CERVICAL SPINE FINDINGS Alignment: Normal. Skull base and vertebrae: No acute fracture. No primary bone lesion or focal pathologic process. Soft tissues and spinal canal: No prevertebral fluid or swelling. No visible canal hematoma. 4.9 cm right thyroid nodule is noted. Disc levels:  Normal. Upper chest: Negative. Other: None. IMPRESSION: 1. Mild chronic ischemic white matter disease. Mild bilateral ethmoid and maxillary sinusitis. No acute intracranial abnormality seen. 2. No significant abnormality seen in the cervical spine. 3. 4.9 cm right thyroid nodule is noted. This has been evaluated on previous imaging. (ref: J Am Coll Radiol. 2015 Feb;12(2): 143-50). Electronically Signed   By: Marijo Conception M.D.   On: 08/17/2020 11:22   CT Chest W Contrast  Result Date: 08/17/2020 CLINICAL DATA:  Vomiting since last night with weakness, body aches, confusion, headache and dizziness. EXAM: CT CHEST, ABDOMEN, AND PELVIS WITH CONTRAST TECHNIQUE: Multidetector CT imaging of the chest, abdomen and pelvis was performed following the standard protocol during bolus administration of intravenous contrast. CONTRAST:  165m OMNIPAQUE IOHEXOL 300 MG/ML  SOLN COMPARISON:  11/16/2012 FINDINGS: CT CHEST FINDINGS Cardiovascular: Heart normal in size and configuration. No pericardial effusion or coronary artery calcifications. Normal great vessels. Mediastinum/Nodes: Enlarged right thyroid lobe with a heterogeneous nodule. Patient has previously undergone thyroid ultrasound and thyroid nodule biopsy in 2016 July and August, with follow-up thyroid ultrasound on 02/12/2017. No neck base, axillary, mediastinal or hilar masses or enlarged lymph nodes. Normal trachea and esophagus. Lungs/Pleura: Patchy airspace  consolidation in the right upper lobe. Small area similar consolidation in the posterolateral right middle lobe. Discoid and minor peripheral hazy opacity in the right lower lobe, likely atelectasis. Left lung is clear. No  pleural effusion or pneumothorax. Musculoskeletal: No chest wall mass or suspicious bone lesions identified. CT ABDOMEN PELVIS FINDINGS Hepatobiliary: Liver normal in overall size. Decreased liver attenuation consistent with fatty infiltration. No mass or focal lesion. Status post cholecystectomy. No bile duct dilation. Pancreas: Choose 1 Spleen: Normal in size without focal abnormality. Adrenals/Urinary Tract: No adrenal masses. There are several bilateral subcentimeter low-density renal masses consistent with cysts. Kidneys are normal in size, orientation and position with symmetric enhancement and excretion. No intrarenal stones. No hydronephrosis. Normal ureters. Normal bladder. Stomach/Bowel: Normal stomach. Small bowel and colon are normal in caliber. No wall thickening. No inflammation. There are scattered colonic diverticula. Normal appendix visualized. Vascular/Lymphatic: Minimal aortic atherosclerosis. No aneurysm. No other vascular abnormality. No enlarged lymph nodes. Reproductive: Status post hysterectomy. No adnexal masses. Other: No abdominal wall hernia or abnormality. No abdominopelvic ascites. Musculoskeletal: No fracture or acute finding.  No bone lesion. IMPRESSION: 1. Patchy consolidation in the right upper lobe consistent with pneumonia. There is a smaller similar focus in the posterolateral right middle lobe. 2. No other acute finding within the chest, abdomen or pelvis. 3. Hepatic steatosis. Electronically Signed   By: Lajean Manes M.D.   On: 08/17/2020 13:12   CT Cervical Spine Wo Contrast  Result Date: 08/17/2020 CLINICAL DATA:  Headache and dizziness after fall 6 months ago. EXAM: CT HEAD WITHOUT CONTRAST CT CERVICAL SPINE WITHOUT CONTRAST TECHNIQUE: Multidetector CT imaging of the head and cervical spine was performed following the standard protocol without intravenous contrast. Multiplanar CT image reconstructions of the cervical spine were also generated. COMPARISON:  June 15, 2020. FINDINGS: CT HEAD FINDINGS Brain: Mild chronic ischemic white matter disease. No mass effect or midline shift is noted. Ventricular size is within normal limits. There is no evidence of mass lesion, hemorrhage or acute infarction. Vascular: No hyperdense vessel or unexpected calcification. Skull: Normal. Negative for fracture or focal lesion. Sinuses/Orbits: Mild bilateral ethmoid and maxillary sinusitis. Other: None. CT CERVICAL SPINE FINDINGS Alignment: Normal. Skull base and vertebrae: No acute fracture. No primary bone lesion or focal pathologic process. Soft tissues and spinal canal: No prevertebral fluid or swelling. No visible canal hematoma. 4.9 cm right thyroid nodule is noted. Disc levels:  Normal. Upper chest: Negative. Other: None. IMPRESSION: 1. Mild chronic ischemic white matter disease. Mild bilateral ethmoid and maxillary sinusitis. No acute intracranial abnormality seen. 2. No significant abnormality seen in the cervical spine. 3. 4.9 cm right thyroid nodule is noted. This has been evaluated on previous imaging. (ref: J Am Coll Radiol. 2015 Feb;12(2): 143-50). Electronically Signed   By: Marijo Conception M.D.   On: 08/17/2020 11:22   CT ABDOMEN PELVIS W CONTRAST  Result Date: 08/17/2020 CLINICAL DATA:  Vomiting since last night with weakness, body aches, confusion, headache and dizziness. EXAM: CT CHEST, ABDOMEN, AND PELVIS WITH CONTRAST TECHNIQUE: Multidetector CT imaging of the chest, abdomen and pelvis was performed following the standard protocol during bolus administration of intravenous contrast. CONTRAST:  138m OMNIPAQUE IOHEXOL 300 MG/ML  SOLN COMPARISON:  11/16/2012 FINDINGS: CT CHEST FINDINGS Cardiovascular: Heart normal in size and configuration. No pericardial effusion or coronary artery calcifications. Normal great vessels. Mediastinum/Nodes: Enlarged right thyroid lobe with a heterogeneous nodule. Patient has previously undergone thyroid ultrasound and thyroid nodule biopsy  in 2016 July  and August, with follow-up thyroid ultrasound on 02/12/2017. No neck base, axillary, mediastinal or hilar masses or enlarged lymph nodes. Normal trachea and esophagus. Lungs/Pleura: Patchy airspace consolidation in the right upper lobe. Small area similar consolidation in the posterolateral right middle lobe. Discoid and minor peripheral hazy opacity in the right lower lobe, likely atelectasis. Left lung is clear. No pleural effusion or pneumothorax. Musculoskeletal: No chest wall mass or suspicious bone lesions identified. CT ABDOMEN PELVIS FINDINGS Hepatobiliary: Liver normal in overall size. Decreased liver attenuation consistent with fatty infiltration. No mass or focal lesion. Status post cholecystectomy. No bile duct dilation. Pancreas: Choose 1 Spleen: Normal in size without focal abnormality. Adrenals/Urinary Tract: No adrenal masses. There are several bilateral subcentimeter low-density renal masses consistent with cysts. Kidneys are normal in size, orientation and position with symmetric enhancement and excretion. No intrarenal stones. No hydronephrosis. Normal ureters. Normal bladder. Stomach/Bowel: Normal stomach. Small bowel and colon are normal in caliber. No wall thickening. No inflammation. There are scattered colonic diverticula. Normal appendix visualized. Vascular/Lymphatic: Minimal aortic atherosclerosis. No aneurysm. No other vascular abnormality. No enlarged lymph nodes. Reproductive: Status post hysterectomy. No adnexal masses. Other: No abdominal wall hernia or abnormality. No abdominopelvic ascites. Musculoskeletal: No fracture or acute finding.  No bone lesion. IMPRESSION: 1. Patchy consolidation in the right upper lobe consistent with pneumonia. There is a smaller similar focus in the posterolateral right middle lobe. 2. No other acute finding within the chest, abdomen or pelvis. 3. Hepatic steatosis. Electronically Signed   By: Lajean Manes M.D.   On: 08/17/2020 13:12   DG  Chest Portable 1 View  Addendum Date: 08/17/2020   ADDENDUM REPORT: 08/17/2020 11:29 ADDENDUM: These results were called by telephone at the time of interpretation on 08/17/2020 at 11:29 am to provider [facility] , who verbally acknowledged these results. Electronically Signed   By: Zetta Bills M.D.   On: 08/17/2020 11:29   Result Date: 08/17/2020 CLINICAL DATA:  Shortness of breath, cough and body aches EXAM: PORTABLE CHEST 1 VIEW COMPARISON:  Chest x-ray August 17, 2018 FINDINGS: Cardiomediastinal contours are stable and accentuated by portable technique. Nodular density in the RIGHT lung apex is in the area of the first rib and sternal junction but is quite asymmetric and a change from the previous imaging study this measures 1.6 x 1.1 cm. RIGHT hemidiaphragm remains elevated. No sign of lobar consolidative change or evidence of pleural effusion on frontal radiography. No acute skeletal process on limited assessment. IMPRESSION: 1. No acute cardiopulmonary disease. 2. Nodular density in the RIGHT lung apex is quite asymmetric and a change from the previous imaging study measures 1.6 x 1.1 cm. CT of the chest is suggested for further assessment to exclude pulmonary nodule in this location. Infection could potentially have this appearance though unifocal nature and lack of lobar or segmental distribution is concerning for nodule. Could consider short interval follow-up within 8 weeks to ensure resolution as well. 3. Chronic elevation of the RIGHT hemidiaphragm. Call is out to the referring provider to further discuss findings in the above case. Electronically Signed: By: Zetta Bills M.D. On: 08/17/2020 11:18   MRI brain 09/23/2014:Abnormal MRI scan of the brain showing tiny bilateral subcortical and periventricular nonspecific white matter hyperintensities with the differential discussed above. No enhancing lesions are noted.  MRI cervical and thoracic spine wwo contrast  06/11/2011:Negative cervicaland thoracicspine MRI. No evidence of multiple sclerosis. Central canal and foramina widely patent all levels.  NCS/EMG of the right arm and  leg 09/21/2013:Nerve conduction studies done on both upper extremities and on the right lower extremity were normal. No evidence of a peripheral neuropathy is seen. EMG evaluation was not done. Repetitive nerve stimulation on the right hand was done at 3 Hz stimulation, and again at 40 Hz stimulation without evidence of a neuromuscular transmission disorder. Repetitive nerve stimulation studies were completely normal.     Procedures Procedures (including critical care time)  Medications Ordered in ED Medications  0.9 %  sodium chloride infusion (has no administration in time range)  sodium chloride 0.9 % bolus 1,000 mL (0 mLs Intravenous Stopped 08/17/20 1230)  ondansetron (ZOFRAN) injection 4 mg (4 mg Intravenous Given 08/17/20 1118)  meclizine (ANTIVERT) tablet 25 mg (25 mg Oral Given 08/17/20 1200)  iohexol (OMNIPAQUE) 300 MG/ML solution 100 mL (100 mLs Intravenous Contrast Given 08/17/20 1246)  cefTRIAXone (ROCEPHIN) 1 g in sodium chloride 0.9 % 100 mL IVPB (0 g Intravenous Stopped 08/17/20 1630)  azithromycin (ZITHROMAX) 500 mg in sodium chloride 0.9 % 250 mL IVPB (500 mg Intravenous New Bag/Given 08/17/20 1613)   ED Course  I have reviewed the triage vital signs and the nursing notes.  Pertinent labs & imaging results that were available during my care of the patient were reviewed by me and considered in my medical decision making (see chart for details).  59 year old presents for evaluation of multiple complaints.  Had a fall yesterday.  Increased difficulty walking.  Is also been having upper respiratory complaints x1 week.  Had outpatient Covid test which was negative.  She is vaccinated.  No known exposures.  Also having emesis.  No urinary complaints.  Wife feels like patient is more confused than baseline.   No recent medication changes.  She is unsure if patient hit her head after her fall yesterday as this was unwitnessed.  Plan on labs, imaging and reassess last known normal 8 PM yesterday, no code stroke is no neurologic deficits on exam however with flat affect.  Labs and imaging personally reviewed and interpreted:  CBC with leukocytosis at 13.1 CMP with mild hyperglycemia at 219, CO2 20, no additional electrolyte, renal or liver abnormality Trop 3 Lipase 21 Lactic acid 1.7 DG chest possible mass vs infection, recommend CT DG pelvis without acute changes CT chest with RUL PNA CT AP without acute changes CT head without acute changes CT cervical without changes  Patient up to bed side commode without UA collection.  Patient reassessed. 91% on RA.  Does become hypoxic to 88-89% on room air and becomes moderately tachycardic into the 130s-140s with ambulation.  Is able to recover after sitting in bed for 68mnutes to 90-91%.  Patient was able to ambulate with improved gait.  Significant of the room states that patient has had normal gait, multiple times a week and has frequent falls.  This is a chronic problem.  I low suspicion for acute intracranial process at this time.  No ascending weakness to suggest Guillian  barre.  She has equal strength bilaterally.  Discussed with lab tech at [facility]  States patient's Covid test will be done at 1659.  Do not feel we need to get a 2-hour Covid test as this will result in symptoms a 6 to 24-hour test.  In the meantime patient does have leukocytosis, tachycardia, elevated lactic acid.  Will treat with antibiotics for infectious process.  Discussed results with patient, significant other in room.  Significant other does state that patient does have days abnormal gait frequent fall  which are intermittent, chronic in nature.  Has been occurring intermittently over the years.  Which is why she has been followed by neurology.  States they have been unable  to find the cause. On my evaluation patient has nonfocal neuro exam without deficits.  CT head reassuring. Low suspicion for posterior intracranial pathology as cause of findings  CONSULT with Dr. Verlon Au with [facility] who agrees to accept patient in transfer for admission. Does recommend IVF 100/hr and call to other hospitals as no beds at [facility] available per [facility]  COVID NEGATIVE  Attempt to call [facility] and [facility] per [facility] recommendation. Not accepting transfers.  The patient appears reasonably stabilized for admission considering the current resources, flow, and capabilities available in the ED at this time, and I doubt any other [facility] requiring further screening and/or treatment in the ED prior to admission.       MDM Rules/Calculators/A&P                          Final Clinical Impression(s) / ED Diagnoses Final diagnoses:  Community acquired pneumonia of right lung, unspecified part of lung  Acute respiratory failure with hypoxia (HCC)  Elevated lactic acid level  Hyperglycemia    Rx / DC Orders ED Discharge Orders    None       Adilene Areola A, PA-C 08/17/20 1807    Mabell Esguerra A, PA-C 08/17/20 1942    Quintella Reichert, MD 08/18/20 [phone]

## 2020-08-17 NOTE — ED Triage Notes (Signed)
Pt reports generalized aching and generalized weakness, pt wife reports pt confused x this morning, pushing wrong buttons in her own car on the way here and "not choosing the right words for what she was trying to say" pt with emesis at triage, wife reports emesis x 1 this am. Pt reports ha x this am with dizzyness.

## 2020-08-17 NOTE — H&P (Signed)
History and Physical    [name]:[id number] DOB: [birth year] DOA: 08/17/2020  PCP: [name], [name], MD   Patient coming from: Home   Chief Complaint: Cough, SOB, malaise, confusion   HPI: [name] is a 59 y.o. female with medical history significant for depression, anxiety, headaches, and IBS, now presenting to the emergency department for evaluation of worsening cough, shortness of breath, malaise, and mild confusion.  Patient had been seen by her PCP for the symptoms on 08/14/2020, has been using cough syrup, inhalers, and Alka-Seltzer, but has continued to worsen.  She is noted by family to have some mild confusion, fatigue, increased difficulty with ambulation, and suffered a fall. Cough has been productive of some thick brownish and clear sputum. Feels "miserable" in general on admission but no chest pain, abdominal, or other localized pain.   [name]: Upon arrival to the ED, patient is found to be afebrile, saturating low 90s on room air, tachypneic, tachycardic, and with stable blood pressure.  EKG features sinus tachycardia.  Noncontrast head CT is negative for acute intracranial abnormality.  CT cervical spine negative for acute findings.  CT chest/abdomen/pelvis is notable for patchy right upper lobe consolidation concerning for pneumonia as well as a smaller focus in the right middle lobe.  Chemistry panel features a glucose of 219.  CBC notable for leukocytosis to 13,100.  Lithium level is 0.49.  COVID-19 PCR is negative.  Lactic acid is slightly elevated and troponin is normal.  Blood culture was collected and the patient was given IV fluids, Zofran, meclizine, acetaminophen, Rocephin, and azithromycin.  Review of Systems:  All other systems reviewed and apart from HPI, are negative.  Past Medical History:  Diagnosis Date  . Anxiety   . Bipolar 1 disorder (HCC)    HX PSYCHOSIS  . Complication of anesthesia    POST AGGITATION  .  Frequency of urination   . Headache(784.0)   . History of duodenal ulcer   . Hyperlipidemia   . IBS (irritable bowel syndrome)   . Nocturia   . Pelvic pain   . Personality disorder ([place])    W/ BORDERLINE FEATURES  . Urgency of urination     Past Surgical History:  Procedure Laterality Date  . CARDIAC CATHETERIZATION  10-09-1999  DR KATZ   NORMAL LVF/  NORMAL RCA/  NO CRITICAL DISEASE LEFT CORONARY SYSTEM  . CARDIOVASCULAR STRESS TEST  01-23-2011   NORMAL NUCLEAR STUDY/ NO ISCHEMIA/ EF 81%  . CYSTOSCOPY WITH BIOPSY N/A 06/03/2013   Procedure: CYSTOSCOPY WITH BIOPSY  INSTILLATION OF MARCAINE AND PYRIDIUM;  Surgeon: Hanley Ben, MD;  Location: [facility];  Service: Urology;  Laterality: N/A;  . LAPAROSCOPIC CHOLECYSTECTOMY  11-08-2001  . LAPAROSCOPIC LEFT SALPINGOOPHORECTOMY AND LYSYS ADHESIONS  03-10-2002  . LAPAROSCOPIC REMOVAL OVARY REMNANT  2003   [place]  . LAPAROSCOPY N/A 12/14/2012   Procedure: LAPAROSCOPY DIAGNOSTIC;  Surgeon: Stark Klein, MD;  Location: [facility];  Service: General;  Laterality: N/A;  . TOTAL ABDOMINAL HYSTERECTOMY  2000   W/ RIGHT SALPINGOOPHORECTOMY  . TRANSTHORACIC ECHOCARDIOGRAM  10-13-2010   NORMAL LVF/  EF 55-60%    Social History:   reports that she quit smoking about 42 years ago. Her smoking use included cigarettes. She has a 0.01 pack-year smoking history. She has never used smokeless tobacco. She reports current alcohol use. She reports that she does not use drugs.  Allergies  Allergen Reactions  . Morphine And Related  Nausea And Vomiting and Nausea Only  . Ambien [Zolpidem]     Per patient this caused her to fall  . Duloxetine     Other reaction(s): SWELLING/EDEMA  . Latuda [Lurasidone Hcl]   . Morphine Nausea Only    Family History  Problem Relation Age of Onset  . Sleep apnea Father   . Depression Father   . Alcohol abuse Father   . Hypertension Father   . Migraines Sister        headaches  . Anxiety  disorder Sister   . Other Mother        MAC infection  . Anxiety disorder Mother   . Dementia Mother   . Heart disease Paternal Grandmother   . Hyperlipidemia Paternal Grandmother   . Alcohol abuse Paternal Grandfather   . Diabetes Neg Hx      Prior to Admission medications   Medication Sig Start Date End Date Taking? Authorizing Provider  albuterol (VENTOLIN HFA) 108 (90 Base) MCG/ACT inhaler Inhale 2 puffs into the lungs every 6 (six) hours as needed for wheezing or shortness of breath. 08/14/20 08/14/21  [name], [name], MD  amoxicillin (AMOXIL) 500 MG capsule Take 1 capsule (500 mg total) by mouth 2 (two) times daily. 07/19/20   Lucretia Kern, DO  benzonatate (TESSALON) 100 MG capsule Take 2 capsules (200 mg total) by mouth 2 (two) times daily as needed for up to 10 days. 08/14/20 08/24/20  [name], [name], MD  buPROPion (WELLBUTRIN XL) 150 MG 24 hr tablet TAKE 1 TABLET BY MOUTH IN THE MORNING FOR 4 DAYS AND THEN TAKE 2 IN THE MORNING FOR 5 DAYS AND THEN TAKE 3 IN THE MORNING 11/03/19   [provider]  Cholecalciferol (VITAMIN D) 50 MCG (2000 UT) tablet Take by mouth.    [provider]  fenofibrate (TRICOR) 145 MG tablet Take 1 tablet (145 mg total) by mouth daily. 12/06/19   [name], [name], MD  fluticasone [facility]) 50 MCG/ACT nasal spray Place 2 sprays into both nostrils daily. 08/14/20   [name], [name], MD  Fluticasone-Salmeterol (ADVAIR DISKUS) 250-50 MCG/DOSE AEPB Inhale 1 puff into the lungs 2 (two) times daily. 08/14/20   [name], [name], MD  gabapentin (NEURONTIN) 300 MG capsule Take by mouth at bedtime.     [provider]  HYDROcodone-homatropine (HYCODAN) 5-1.5 MG/5ML syrup Take 5 mLs by mouth every 12 (twelve) hours as needed for up to 10 days. 08/14/20 08/24/20  [name], [name], MD  hydrOXYzine (ATARAX/VISTARIL) 25 MG tablet Take 1 tablet (25 mg total) by mouth 3 (three) times daily as needed for itching. 04/13/20   Billie Ruddy, MD  lithium  carbonate (LITHOBID) 300 MG CR tablet Take 2 tablets (600 mg total) by mouth every evening. Patient taking differently: Take 1 tablet by mouth in the morning and 2 tablets in the evening. 06/27/15   [place], Elyse Jarvis, FNP  LORazepam (ATIVAN) 0.5 MG tablet Take 0.5 mg by mouth 2 (two) times daily. 80m at bedtime    [provider]  meclizine (ANTIVERT) 25 MG tablet Take 1 tablet (25 mg total) by mouth 3 (three) times daily as needed for dizziness. 06/15/20   STruddie Hidden MD  omeprazole (PRILOSEC) 40 MG capsule SMARTSIG:1 Capsule(s) By Mouth Every Evening 03/21/20   [provider]  ondansetron (ZOFRAN) 4 MG tablet Take 1 tablet (4 mg total) by mouth daily as needed for nausea. 07/19/20   KLucretia Kern DO  Physical Exam: Vitals:   08/17/20 1700 08/17/20 1730 08/17/20 2000 08/17/20 2253  BP: (!) 86/75 109/67 118/67 113/62  Pulse: 91 90 88 87  Resp: _0 Temp:    98 F (36.7 C)  TempSrc:    Oral  SpO2: 92% 92% 94% 98%  Weight:      Height:        Constitutional: NAD, calm  Eyes: PERTLA, lids and conjunctivae normal ENMT: Mucous membranes are moist. Posterior pharynx clear of any exudate or lesions.   Neck: normal, supple, no masses, no thyromegaly Respiratory: Tachypnea, frequent cough. No pallor or cyanosis.  Cardiovascular: S1 & S2 heard, regular rate and rhythm. No extremity edema.   Abdomen: No distension, no tenderness, soft. Bowel sounds active.  Musculoskeletal: no clubbing / cyanosis. No joint deformity upper and lower extremities.   Skin: no significant rashes, lesions, ulcers. Warm, dry, well-perfused. Neurologic: CN 2-12 grossly intact. Sensation intact. Moving all extremities.  Psychiatric: Alert and oriented to person, place, and situation. Calm and cooperative.    Labs and Imaging on Admission: I have personally reviewed following labs and imaging studies  CBC: Recent Labs  Lab 08/17/20 1047  WBC 13.1*  NEUTROABS 11.8*  HGB 13.5  HCT  41.8  MCV 97.4  PLT 820   Basic Metabolic Panel: Recent Labs  Lab 08/17/20 1047  NA 137  K 3.9  CL 106  CO2 20*  GLUCOSE 219*  BUN 8  CREATININE 0.94  CALCIUM 9.9   GFR: Estimated Creatinine Clearance: 55.2 mL/min (by C-G formula based on SCr of 0.94 mg/dL). Liver Function Tests: Recent Labs  Lab 08/17/20 1047  AST 30  ALT 43  ALKPHOS 91  BILITOT 1.0  PROT 7.8  ALBUMIN 4.3   Recent Labs  Lab 08/17/20 1047  LIPASE 21   No results for input(s): AMMONIA in the last 168 hours. Coagulation Profile: No results for input(s): INR, PROTIME in the last 168 hours. Cardiac Enzymes: No results for input(s): CKTOTAL, CKMB, CKMBINDEX, TROPONINI in the last 168 hours. BNP (last 3 results) No results for input(s): PROBNP in the last 8760 hours. HbA1C: No results for input(s): HGBA1C in the last 72 hours. CBG: No results for input(s): GLUCAP in the last 168 hours. Lipid Profile: No results for input(s): CHOL, HDL, LDLCALC, TRIG, CHOLHDL, LDLDIRECT in the last 72 hours. Thyroid Function Tests: No results for input(s): TSH, T4TOTAL, FREET4, T3FREE, THYROIDAB in the last 72 hours. Anemia Panel: No results for input(s): VITAMINB12, FOLATE, FERRITIN, TIBC, IRON, RETICCTPCT in the last 72 hours. Urine analysis:    Component Value Date/Time   COLORURINE YELLOW 08/17/2020 [place] 08/17/2020 1322   LABSPEC 1.005 08/17/2020 1322   PHURINE 7.5 08/17/2020 1322   GLUCOSEU NEGATIVE 08/17/2020 1322   GLUCOSEU NEGATIVE 10/14/2017 1629   HGBUR NEGATIVE 08/17/2020 1322   HGBUR negative 09/16/2010 1504   BILIRUBINUR NEGATIVE 08/17/2020 1322   BILIRUBINUR n 03/23/2014 0941   KETONESUR NEGATIVE 08/17/2020 1322   PROTEINUR NEGATIVE 08/17/2020 1322   UROBILINOGEN 0.2 10/14/2017 1629   NITRITE NEGATIVE 08/17/2020 1322   LEUKOCYTESUR NEGATIVE 08/17/2020 1322   Sepsis Labs: _1 (procalcitonin:4,lacticidven:4) ) Recent Results (from the past 240 hour(s))  SARS  CORONAVIRUS 2 (TAT 6-24 HRS) Nasopharyngeal Nasopharyngeal Swab     Status: None   Collection Time: 08/17/20 10:48 AM   Specimen: Nasopharyngeal Swab  Result Value Ref Range Status   SARS Coronavirus 2 NEGATIVE NEGATIVE Final    Comment: (NOTE) SARS-CoV-2  target nucleic acids are NOT DETECTED.  The SARS-CoV-2 RNA is generally detectable in upper and lower respiratory specimens during the acute phase of infection. Negative results do not preclude SARS-CoV-2 infection, do not rule out co-infections with other pathogens, and should not be used as the sole basis for treatment or other patient management decisions. Negative results must be combined with clinical observations, patient history, and epidemiological information. The expected result is Negative.  Fact Sheet for Patients: SugarRoll.be  Fact Sheet for Healthcare Providers: https://www.woods-mathews.com/  This test is not yet approved or cleared by the [place] FDA and  has been authorized for detection and/or diagnosis of SARS-CoV-2 by FDA under an Emergency Use Authorization (EUA). This EUA will remain  in effect (meaning this test can be used) for the duration of the COVID-19 declaration under Se ction 564(b)(1) of the Act, 21 U.S.C. section 360bbb-3(b)(1), unless the authorization is terminated or revoked sooner.  Performed at [facility], [place] [address], [place], [place] [address]      Radiological Exams on Admission: DG Pelvis 1-2 Views  Result Date: 08/17/2020 CLINICAL DATA:  Status post fall unable to walk. EXAM: PELVIS - 1-2 VIEW COMPARISON:  July 03, 2007 FINDINGS: There is no evidence of pelvic fracture or diastasis. No pelvic bone lesions are seen. IMPRESSION: Negative. Electronically Signed   By: Abelardo Diesel M.D.   On: 08/17/2020 11:15   CT Head Wo Contrast  Result Date: 08/17/2020 CLINICAL DATA:  Headache and dizziness after fall 6 months ago.  EXAM: CT HEAD WITHOUT CONTRAST CT CERVICAL SPINE WITHOUT CONTRAST TECHNIQUE: Multidetector CT imaging of the head and cervical spine was performed following the standard protocol without intravenous contrast. Multiplanar CT image reconstructions of the cervical spine were also generated. COMPARISON:  June 15, 2020. FINDINGS: CT HEAD FINDINGS Brain: Mild chronic ischemic white matter disease. No mass effect or midline shift is noted. Ventricular size is within normal limits. There is no evidence of mass lesion, hemorrhage or acute infarction. Vascular: No hyperdense vessel or unexpected calcification. Skull: Normal. Negative for fracture or focal lesion. Sinuses/Orbits: Mild bilateral ethmoid and maxillary sinusitis. Other: None. CT CERVICAL SPINE FINDINGS Alignment: Normal. Skull base and vertebrae: No acute fracture. No primary bone lesion or focal pathologic process. Soft tissues and spinal canal: No prevertebral fluid or swelling. No visible canal hematoma. 4.9 cm right thyroid nodule is noted. Disc levels:  Normal. Upper chest: Negative. Other: None. IMPRESSION: 1. Mild chronic ischemic white matter disease. Mild bilateral ethmoid and maxillary sinusitis. No acute intracranial abnormality seen. 2. No significant abnormality seen in the cervical spine. 3. 4.9 cm right thyroid nodule is noted. This has been evaluated on previous imaging. (ref: J Am Coll Radiol. 2015 Feb;12(2): 143-50). Electronically Signed   By: Marijo Conception M.D.   On: 08/17/2020 11:22   CT Chest W Contrast  Result Date: 08/17/2020 CLINICAL DATA:  Vomiting since last night with weakness, body aches, confusion, headache and dizziness. EXAM: CT CHEST, ABDOMEN, AND PELVIS WITH CONTRAST TECHNIQUE: Multidetector CT imaging of the chest, abdomen and pelvis was performed following the standard protocol during bolus administration of intravenous contrast. CONTRAST:  150m OMNIPAQUE IOHEXOL 300 MG/ML  SOLN COMPARISON:  11/16/2012 FINDINGS: CT  CHEST FINDINGS Cardiovascular: Heart normal in size and configuration. No pericardial effusion or coronary artery calcifications. Normal great vessels. Mediastinum/Nodes: Enlarged right thyroid lobe with a heterogeneous nodule. Patient has previously undergone thyroid ultrasound and thyroid nodule biopsy in 2016 July and August, with follow-up  thyroid ultrasound on 02/12/2017. No neck base, axillary, mediastinal or hilar masses or enlarged lymph nodes. Normal trachea and esophagus. Lungs/Pleura: Patchy airspace consolidation in the right upper lobe. Small area similar consolidation in the posterolateral right middle lobe. Discoid and minor peripheral hazy opacity in the right lower lobe, likely atelectasis. Left lung is clear. No pleural effusion or pneumothorax. Musculoskeletal: No chest wall mass or suspicious bone lesions identified. CT ABDOMEN PELVIS FINDINGS Hepatobiliary: Liver normal in overall size. Decreased liver attenuation consistent with fatty infiltration. No mass or focal lesion. Status post cholecystectomy. No bile duct dilation. Pancreas: Choose 1 Spleen: Normal in size without focal abnormality. Adrenals/Urinary Tract: No adrenal masses. There are several bilateral subcentimeter low-density renal masses consistent with cysts. Kidneys are normal in size, orientation and position with symmetric enhancement and excretion. No intrarenal stones. No hydronephrosis. Normal ureters. Normal bladder. Stomach/Bowel: Normal stomach. Small bowel and colon are normal in caliber. No wall thickening. No inflammation. There are scattered colonic diverticula. Normal appendix visualized. Vascular/Lymphatic: Minimal aortic atherosclerosis. No aneurysm. No other vascular abnormality. No enlarged lymph nodes. Reproductive: Status post hysterectomy. No adnexal masses. Other: No abdominal wall hernia or abnormality. No abdominopelvic ascites. Musculoskeletal: No fracture or acute finding.  No bone lesion. IMPRESSION: 1.  Patchy consolidation in the right upper lobe consistent with pneumonia. There is a smaller similar focus in the posterolateral right middle lobe. 2. No other acute finding within the chest, abdomen or pelvis. 3. Hepatic steatosis. Electronically Signed   By: Lajean Manes M.D.   On: 08/17/2020 13:12   CT Cervical Spine Wo Contrast  Result Date: 08/17/2020 CLINICAL DATA:  Headache and dizziness after fall 6 months ago. EXAM: CT HEAD WITHOUT CONTRAST CT CERVICAL SPINE WITHOUT CONTRAST TECHNIQUE: Multidetector CT imaging of the head and cervical spine was performed following the standard protocol without intravenous contrast. Multiplanar CT image reconstructions of the cervical spine were also generated. COMPARISON:  June 15, 2020. FINDINGS: CT HEAD FINDINGS Brain: Mild chronic ischemic white matter disease. No mass effect or midline shift is noted. Ventricular size is within normal limits. There is no evidence of mass lesion, hemorrhage or acute infarction. Vascular: No hyperdense vessel or unexpected calcification. Skull: Normal. Negative for fracture or focal lesion. Sinuses/Orbits: Mild bilateral ethmoid and maxillary sinusitis. Other: None. CT CERVICAL SPINE FINDINGS Alignment: Normal. Skull base and vertebrae: No acute fracture. No primary bone lesion or focal pathologic process. Soft tissues and spinal canal: No prevertebral fluid or swelling. No visible canal hematoma. 4.9 cm right thyroid nodule is noted. Disc levels:  Normal. Upper chest: Negative. Other: None. IMPRESSION: 1. Mild chronic ischemic white matter disease. Mild bilateral ethmoid and maxillary sinusitis. No acute intracranial abnormality seen. 2. No significant abnormality seen in the cervical spine. 3. 4.9 cm right thyroid nodule is noted. This has been evaluated on previous imaging. (ref: J Am Coll Radiol. 2015 Feb;12(2): 143-50). Electronically Signed   By: Marijo Conception M.D.   On: 08/17/2020 11:22   CT ABDOMEN PELVIS W  CONTRAST  Result Date: 08/17/2020 CLINICAL DATA:  Vomiting since last night with weakness, body aches, confusion, headache and dizziness. EXAM: CT CHEST, ABDOMEN, AND PELVIS WITH CONTRAST TECHNIQUE: Multidetector CT imaging of the chest, abdomen and pelvis was performed following the standard protocol during bolus administration of intravenous contrast. CONTRAST:  143m OMNIPAQUE IOHEXOL 300 MG/ML  SOLN COMPARISON:  11/16/2012 FINDINGS: CT CHEST FINDINGS Cardiovascular: Heart normal in size and configuration. No pericardial effusion or coronary artery calcifications. Normal great vessels.  Mediastinum/Nodes: Enlarged right thyroid lobe with a heterogeneous nodule. Patient has previously undergone thyroid ultrasound and thyroid nodule biopsy in 2016 July and August, with follow-up thyroid ultrasound on 02/12/2017. No neck base, axillary, mediastinal or hilar masses or enlarged lymph nodes. Normal trachea and esophagus. Lungs/Pleura: Patchy airspace consolidation in the right upper lobe. Small area similar consolidation in the posterolateral right middle lobe. Discoid and minor peripheral hazy opacity in the right lower lobe, likely atelectasis. Left lung is clear. No pleural effusion or pneumothorax. Musculoskeletal: No chest wall mass or suspicious bone lesions identified. CT ABDOMEN PELVIS FINDINGS Hepatobiliary: Liver normal in overall size. Decreased liver attenuation consistent with fatty infiltration. No mass or focal lesion. Status post cholecystectomy. No bile duct dilation. Pancreas: Choose 1 Spleen: Normal in size without focal abnormality. Adrenals/Urinary Tract: No adrenal masses. There are several bilateral subcentimeter low-density renal masses consistent with cysts. Kidneys are normal in size, orientation and position with symmetric enhancement and excretion. No intrarenal stones. No hydronephrosis. Normal ureters. Normal bladder. Stomach/Bowel: Normal stomach. Small bowel and colon are normal in  caliber. No wall thickening. No inflammation. There are scattered colonic diverticula. Normal appendix visualized. Vascular/Lymphatic: Minimal aortic atherosclerosis. No aneurysm. No other vascular abnormality. No enlarged lymph nodes. Reproductive: Status post hysterectomy. No adnexal masses. Other: No abdominal wall hernia or abnormality. No abdominopelvic ascites. Musculoskeletal: No fracture or acute finding.  No bone lesion. IMPRESSION: 1. Patchy consolidation in the right upper lobe consistent with pneumonia. There is a smaller similar focus in the posterolateral right middle lobe. 2. No other acute finding within the chest, abdomen or pelvis. 3. Hepatic steatosis. Electronically Signed   By: Lajean Manes M.D.   On: 08/17/2020 13:12   DG Chest Portable 1 View  Addendum Date: 08/17/2020   ADDENDUM REPORT: 08/17/2020 11:29 ADDENDUM: These results were called by telephone at the time of interpretation on 08/17/2020 at 11:29 am to provider [facility] , who verbally acknowledged these results. Electronically Signed   By: Zetta Bills M.D.   On: 08/17/2020 11:29   Result Date: 08/17/2020 CLINICAL DATA:  Shortness of breath, cough and body aches EXAM: PORTABLE CHEST 1 VIEW COMPARISON:  Chest x-ray August 17, 2018 FINDINGS: Cardiomediastinal contours are stable and accentuated by portable technique. Nodular density in the RIGHT lung apex is in the area of the first rib and sternal junction but is quite asymmetric and a change from the previous imaging study this measures 1.6 x 1.1 cm. RIGHT hemidiaphragm remains elevated. No sign of lobar consolidative change or evidence of pleural effusion on frontal radiography. No acute skeletal process on limited assessment. IMPRESSION: 1. No acute cardiopulmonary disease. 2. Nodular density in the RIGHT lung apex is quite asymmetric and a change from the previous imaging study measures 1.6 x 1.1 cm. CT of the chest is suggested for further assessment to exclude  pulmonary nodule in this location. Infection could potentially have this appearance though unifocal nature and lack of lobar or segmental distribution is concerning for nodule. Could consider short interval follow-up within 8 weeks to ensure resolution as well. 3. Chronic elevation of the RIGHT hemidiaphragm. Call is out to the referring provider to further discuss findings in the above case. Electronically Signed: By: Zetta Bills M.D. On: 08/17/2020 11:18    EKG: Independently reviewed. Sinus tachycardia, rate 101.   Assessment/Plan  1. Severe sepsis secondary to pneumonia  - Presents with worsening cough, SOB, malaise, and mild confusion and is found to be tachycardic and tachypneic with  leukocytosis, elevated lactate, and right-sided pneumonia on CT  - Blood culture was collected in ED and she was started on Rocephin and azithromycin  - Check strep pneumo and legionella antigens, continue Rocephin and azithromycin, trend lactate, and follow cultures and clinical course    2. Acute encephalopathy  - Patient's family reported mild confusion, fatigue, and a fall   - She is alert and fully oriented on admission  - No acute findings on head CT and no focal neuro deficits identified, lithium level 0.49 - Likely related to pneumonia, monitor with treatment   3. Depression, anxiety  - Appears stable   - Pharmacy medication-reconciliation pending   4. Hyperglycemia  - Serum glucose is 219 in ED  - A1c was 5.5% in April 2021  - Likely reactive to acute illness  - Update A1c, monitor CBGs, use low-intensity SSI if needed      DVT prophylaxis: Lovenox  Code Status: Full  Family Communication: Discussed with patient  Disposition Plan:  Patient is from: Home  Anticipated d/c is to: Home  Anticipated d/c date is: 08/20/20 Patient currently: Pending improvement in respiratory status  Consults called: None   Admission status: Inpatient. PORT score is 108 and inpatient management is  recommended.     Vianne Bulls, MD [facility]  08/18/2020, 12:29 AM

## 2020-08-18 DIAGNOSIS — R7989 Other specified abnormal findings of blood chemistry: Secondary | ICD-10-CM

## 2020-08-18 DIAGNOSIS — J9601 Acute respiratory failure with hypoxia: Secondary | ICD-10-CM | POA: Diagnosis not present

## 2020-08-18 DIAGNOSIS — G9341 Metabolic encephalopathy: Secondary | ICD-10-CM | POA: Diagnosis not present

## 2020-08-18 DIAGNOSIS — J189 Pneumonia, unspecified organism: Secondary | ICD-10-CM | POA: Diagnosis not present

## 2020-08-18 LAB — BASIC METABOLIC PANEL
Anion gap: 11 (ref 5–15)
BUN: 6 mg/dL (ref 6–20)
CO2: 20 mmol/L — ABNORMAL LOW (ref 22–32)
Calcium: 9.8 mg/dL (ref 8.9–10.3)
Chloride: 111 mmol/L (ref 98–111)
Creatinine, Ser: 0.8 mg/dL (ref 0.44–1.00)
GFR, Estimated: >60 mL/min (ref >60)
Glucose, Bld: 147 mg/dL — ABNORMAL HIGH (ref 70–99)
Potassium: 3.6 mmol/L (ref 3.5–5.1)
Sodium: 142 mmol/L (ref 135–145)

## 2020-08-18 LAB — CBC
HCT: 38.4 % (ref 36.0–46.0)
Hemoglobin: 12.5 g/dL (ref 12.0–15.0)
MCH: 32.1 pg (ref 26.0–34.0)
MCHC: 32.6 g/dL (ref 30.0–36.0)
MCV: 98.7 fL (ref 80.0–100.0)
Platelets: 267 10*3/uL (ref 150–400)
RBC: 3.89 MIL/uL (ref 3.87–5.11)
RDW: 13 % (ref 11.5–15.5)
WBC: 11.6 10*3/uL — ABNORMAL HIGH (ref 4.0–10.5)
nRBC: 0 % (ref 0.0–0.2)

## 2020-08-18 LAB — GLUCOSE, CAPILLARY
Glucose-Capillary: 121 mg/dL — ABNORMAL HIGH (ref 70–99)
Glucose-Capillary: 77 mg/dL (ref 70–99)

## 2020-08-18 LAB — HEMOGLOBIN A1C
Hgb A1c MFr Bld: 5.2 % (ref 4.8–5.6)
Mean Plasma Glucose: 102.54 mg/dL

## 2020-08-18 LAB — HIV ANTIBODY (ROUTINE TESTING W REFLEX): HIV Screen 4th Generation wRfx: NONREACTIVE

## 2020-08-18 LAB — STREP PNEUMONIAE URINARY ANTIGEN: Strep Pneumo Urinary Antigen: NEGATIVE

## 2020-08-18 MED ORDER — LITHIUM CARBONATE ER 300 MG PO TBCR
300.0000 mg | EXTENDED_RELEASE_TABLET | Freq: Every morning | ORAL | Status: DC
  Administered 2020-08-19 – 2020-08-20 (×2): 300 mg via ORAL
  Filled 2020-08-18 (×2): qty 1

## 2020-08-18 MED ORDER — MOMETASONE FURO-FORMOTEROL FUM 200-5 MCG/ACT IN AERO
2.0000 | INHALATION_SPRAY | Freq: Two times a day (BID) | RESPIRATORY_TRACT | Status: DC
  Administered 2020-08-18 – 2020-08-20 (×4): 2 via RESPIRATORY_TRACT
  Filled 2020-08-18 (×2): qty 8.8

## 2020-08-18 MED ORDER — BUPROPION HCL ER (XL) 150 MG PO TB24
450.0000 mg | ORAL_TABLET | Freq: Every day | ORAL | Status: DC
  Administered 2020-08-19 – 2020-08-20 (×2): 450 mg via ORAL
  Filled 2020-08-18 (×2): qty 3

## 2020-08-18 MED ORDER — BUPROPION HCL ER (XL) 150 MG PO TB24
150.0000 mg | ORAL_TABLET | Freq: Every day | ORAL | Status: DC
  Administered 2020-08-18: 150 mg via ORAL
  Filled 2020-08-18: qty 1

## 2020-08-18 MED ORDER — LORAZEPAM 0.5 MG PO TABS
0.5000 mg | ORAL_TABLET | Freq: Two times a day (BID) | ORAL | Status: DC
  Administered 2020-08-18 – 2020-08-19 (×3): 0.5 mg via ORAL
  Filled 2020-08-18 (×5): qty 1

## 2020-08-18 MED ORDER — GABAPENTIN 300 MG PO CAPS
300.0000 mg | ORAL_CAPSULE | Freq: Every day | ORAL | Status: DC
  Administered 2020-08-18 – 2020-08-19 (×2): 300 mg via ORAL
  Filled 2020-08-18 (×2): qty 1

## 2020-08-18 MED ORDER — LITHIUM CARBONATE ER 300 MG PO TBCR
600.0000 mg | EXTENDED_RELEASE_TABLET | Freq: Every day | ORAL | Status: DC
  Administered 2020-08-18 – 2020-08-19 (×2): 600 mg via ORAL
  Filled 2020-08-18 (×2): qty 2

## 2020-08-18 MED ORDER — LITHIUM CARBONATE ER 300 MG PO TBCR
600.0000 mg | EXTENDED_RELEASE_TABLET | Freq: Every evening | ORAL | Status: DC
  Filled 2020-08-18: qty 2

## 2020-08-18 MED ORDER — FENOFIBRATE 54 MG PO TABS
54.0000 mg | ORAL_TABLET | Freq: Every day | ORAL | Status: DC
  Administered 2020-08-20: 54 mg via ORAL
  Filled 2020-08-18 (×3): qty 1

## 2020-08-18 MED ORDER — ALBUTEROL SULFATE HFA 108 (90 BASE) MCG/ACT IN AERS: 2.0000 | INHALATION_SPRAY | Freq: Four times a day (QID) | RESPIRATORY_TRACT | Status: DC | PRN

## 2020-08-18 NOTE — Progress Notes (Signed)
[facility] PROGRESS NOTE    Progress Note  [name]  [id number] DOB: [birth year] DOA: 08/17/2020 PCP: [name], [name], MD     Brief Narrative:   [name] is an 60 y.o. female past medical history significant for depression anxiety IBS comes to the emergency room for worsening cough shortness of breath and malaise, she was recently seen by her PCP on 08/14/2020, in the ED was found febrile satting in the 90s CT of the head and C-spine negative for acute findings.  CT scan of the abdomen and pelvis catch the lower right lower lobe consolidation.  Assessment/Plan:   Severe sepsis secondary to Community acquired pneumonia of right lung Agree with IV Rocephin and azithromycin. Culture data is negative.  Has remained afebrile, proving, there is mild increase in lactic acid.  Acute metabolic encephalopathy: Likely due to infectious etiology, CT scan of the head showed no acute findings. Lithium level 0.9.  Which is actually borderline low  Hyperglycemia: With an A1c of 5.5 in April 2021.  Continue sliding scale insulin.  Schizoaffective disorder, bipolar type ([place]) Resume home medications.  DVT prophylaxis: Lovenox Family Communication:none Status is: Inpatient  Remains inpatient appropriate because:Hemodynamically unstable   Dispo: The patient is from: Home              Anticipated d/c is to: Home              Anticipated d/c date is: 2 days              Patient currently is not medically stable to d/c.        Code Status:     Code Status Orders  (From admission, onward)         Start     Ordered   08/17/20 2345  Full code  Continuous        08/17/20 2347        Code Status History    Date Active Date Inactive Code Status Order ID Comments User Context   06/19/2015 1544 06/27/2015 1813 Full Code [id number]  Patrecia Pour, NP Inpatient   06/18/2015 1615 06/19/2015 1544 Full Code [id number]  Virgel Manifold, MD ED   05/21/2014 1117  05/23/2014 1608 Full Code [id number]  Mirna Mires, MD ED   05/18/2014 1215 05/19/2014 1814 Full Code [id number]  Orlie Dakin, MD ED   05/03/2014 1433 05/17/2014 1919 Full Code [id number]  Ursula Alert, MD Inpatient   05/01/2014 0224 05/03/2014 1433 Full Code [id number]  Toy Baker, MD Inpatient   04/12/2014 1604 04/24/2014 1650 Full Code [id number]  Clarene Reamer, MD Inpatient   04/12/2014 1604 04/12/2014 1604 Full Code [id number]  Clarene Reamer, MD Inpatient   04/12/2014 0238 04/12/2014 1604 Full Code [id number]  Kalman Drape, MD ED   02/03/2013 0207 02/03/2013 1504 Full Code [id number]  Kalman Drape, MD ED   Advance Care Planning Activity        IV Access:    Peripheral IV   Procedures and diagnostic studies:   DG Pelvis 1-2 Views  Result Date: 08/17/2020 CLINICAL DATA:  Status post fall unable to walk. EXAM: PELVIS - 1-2 VIEW COMPARISON:  July 03, 2007 FINDINGS: There is no evidence of pelvic fracture or diastasis. No pelvic bone lesions are seen. IMPRESSION: Negative. Electronically Signed   By: Abelardo Diesel M.D.   On: 08/17/2020 11:15   CT Head Wo Contrast  Result Date: 08/17/2020 CLINICAL DATA:  Headache and dizziness after  fall 6 months ago. EXAM: CT HEAD WITHOUT CONTRAST CT CERVICAL SPINE WITHOUT CONTRAST TECHNIQUE: Multidetector CT imaging of the head and cervical spine was performed following the standard protocol without intravenous contrast. Multiplanar CT image reconstructions of the cervical spine were also generated. COMPARISON:  June 15, 2020. FINDINGS: CT HEAD FINDINGS Brain: Mild chronic ischemic white matter disease. No mass effect or midline shift is noted. Ventricular size is within normal limits. There is no evidence of mass lesion, hemorrhage or acute infarction. Vascular: No hyperdense vessel or unexpected calcification. Skull: Normal. Negative for fracture or focal lesion. Sinuses/Orbits: Mild bilateral ethmoid and maxillary sinusitis. Other: None. CT  CERVICAL SPINE FINDINGS Alignment: Normal. Skull base and vertebrae: No acute fracture. No primary bone lesion or focal pathologic process. Soft tissues and spinal canal: No prevertebral fluid or swelling. No visible canal hematoma. 4.9 cm right thyroid nodule is noted. Disc levels:  Normal. Upper chest: Negative. Other: None. IMPRESSION: 1. Mild chronic ischemic white matter disease. Mild bilateral ethmoid and maxillary sinusitis. No acute intracranial abnormality seen. 2. No significant abnormality seen in the cervical spine. 3. 4.9 cm right thyroid nodule is noted. This has been evaluated on previous imaging. (ref: J Am Coll Radiol. 2015 Feb;12(2): 143-50). Electronically Signed   By: Lupita Raider M.D.   On: 08/17/2020 11:22   CT Chest W Contrast  Result Date: 08/17/2020 CLINICAL DATA:  Vomiting since last night with weakness, body aches, confusion, headache and dizziness. EXAM: CT CHEST, ABDOMEN, AND PELVIS WITH CONTRAST TECHNIQUE: Multidetector CT imaging of the chest, abdomen and pelvis was performed following the standard protocol during bolus administration of intravenous contrast. CONTRAST:  OMNIPAQUE IOHEXOL 300 MG/ML  SOLN COMPARISON:  11/16/2012 FINDINGS: CT CHEST FINDINGS Cardiovascular: Heart normal in size and configuration. No pericardial effusion or coronary artery calcifications. Normal great vessels. Mediastinum/Nodes: Enlarged right thyroid lobe with a heterogeneous nodule. Patient has previously undergone thyroid ultrasound and thyroid nodule biopsy in 2016 July and August, with follow-up thyroid ultrasound on 02/12/2017. No neck base, axillary, mediastinal or hilar masses or enlarged lymph nodes. Normal trachea and esophagus. Lungs/Pleura: Patchy airspace consolidation in the right upper lobe. Small area similar consolidation in the posterolateral right middle lobe. Discoid and minor peripheral hazy opacity in the right lower lobe, likely atelectasis. Left lung is clear. No  pleural effusion or pneumothorax. Musculoskeletal: No chest wall mass or suspicious bone lesions identified. CT ABDOMEN PELVIS FINDINGS Hepatobiliary: Liver normal in overall size. Decreased liver attenuation consistent with fatty infiltration. No mass or focal lesion. Status post cholecystectomy. No bile duct dilation. Pancreas: Choose 1 Spleen: Normal in size without focal abnormality. Adrenals/Urinary Tract: No adrenal masses. There are several bilateral subcentimeter low-density renal masses consistent with cysts. Kidneys are normal in size, orientation and position with symmetric enhancement and excretion. No intrarenal stones. No hydronephrosis. Normal ureters. Normal bladder. Stomach/Bowel: Normal stomach. Small bowel and colon are normal in caliber. No wall thickening. No inflammation. There are scattered colonic diverticula. Normal appendix visualized. Vascular/Lymphatic: Minimal aortic atherosclerosis. No aneurysm. No other vascular abnormality. No enlarged lymph nodes. Reproductive: Status post hysterectomy. No adnexal masses. Other: No abdominal wall hernia or abnormality. No abdominopelvic ascites. Musculoskeletal: No fracture or acute finding.  No bone lesion. IMPRESSION: 1. Patchy consolidation in the right upper lobe consistent with pneumonia. There is a smaller similar focus in the posterolateral right middle lobe. 2. No other acute finding within the chest, abdomen or pelvis. 3. Hepatic steatosis. Electronically Signed   By: Onalee Hua  Ormond M.D.   On: 08/17/2020 13:12   CT Cervical Spine Wo Contrast  Result Date: 08/17/2020 CLINICAL DATA:  Headache and dizziness after fall 6 months ago. EXAM: CT HEAD WITHOUT CONTRAST CT CERVICAL SPINE WITHOUT CONTRAST TECHNIQUE: Multidetector CT imaging of the head and cervical spine was performed following the standard protocol without intravenous contrast. Multiplanar CT image reconstructions of the cervical spine were also generated. COMPARISON:  June 15, 2020. FINDINGS: CT HEAD FINDINGS Brain: Mild chronic ischemic white matter disease. No mass effect or midline shift is noted. Ventricular size is within normal limits. There is no evidence of mass lesion, hemorrhage or acute infarction. Vascular: No hyperdense vessel or unexpected calcification. Skull: Normal. Negative for fracture or focal lesion. Sinuses/Orbits: Mild bilateral ethmoid and maxillary sinusitis. Other: None. CT CERVICAL SPINE FINDINGS Alignment: Normal. Skull base and vertebrae: No acute fracture. No primary bone lesion or focal pathologic process. Soft tissues and spinal canal: No prevertebral fluid or swelling. No visible canal hematoma. 4.9 cm right thyroid nodule is noted. Disc levels:  Normal. Upper chest: Negative. Other: None. IMPRESSION: 1. Mild chronic ischemic white matter disease. Mild bilateral ethmoid and maxillary sinusitis. No acute intracranial abnormality seen. 2. No significant abnormality seen in the cervical spine. 3. 4.9 cm right thyroid nodule is noted. This has been evaluated on previous imaging. (ref: J Am Coll Radiol. 2015 Feb;12(2): 143-50). Electronically Signed   By: Marijo Conception M.D.   On: 08/17/2020 11:22   CT ABDOMEN PELVIS W CONTRAST  Result Date: 08/17/2020 CLINICAL DATA:  Vomiting since last night with weakness, body aches, confusion, headache and dizziness. EXAM: CT CHEST, ABDOMEN, AND PELVIS WITH CONTRAST TECHNIQUE: Multidetector CT imaging of the chest, abdomen and pelvis was performed following the standard protocol during bolus administration of intravenous contrast. CONTRAST:  152mL OMNIPAQUE IOHEXOL 300 MG/ML  SOLN COMPARISON:  11/16/2012 FINDINGS: CT CHEST FINDINGS Cardiovascular: Heart normal in size and configuration. No pericardial effusion or coronary artery calcifications. Normal great vessels. Mediastinum/Nodes: Enlarged right thyroid lobe with a heterogeneous nodule. Patient has previously undergone thyroid ultrasound and thyroid nodule biopsy  in 2016 July and August, with follow-up thyroid ultrasound on 02/12/2017. No neck base, axillary, mediastinal or hilar masses or enlarged lymph nodes. Normal trachea and esophagus. Lungs/Pleura: Patchy airspace consolidation in the right upper lobe. Small area similar consolidation in the posterolateral right middle lobe. Discoid and minor peripheral hazy opacity in the right lower lobe, likely atelectasis. Left lung is clear. No pleural effusion or pneumothorax. Musculoskeletal: No chest wall mass or suspicious bone lesions identified. CT ABDOMEN PELVIS FINDINGS Hepatobiliary: Liver normal in overall size. Decreased liver attenuation consistent with fatty infiltration. No mass or focal lesion. Status post cholecystectomy. No bile duct dilation. Pancreas: Choose 1 Spleen: Normal in size without focal abnormality. Adrenals/Urinary Tract: No adrenal masses. There are several bilateral subcentimeter low-density renal masses consistent with cysts. Kidneys are normal in size, orientation and position with symmetric enhancement and excretion. No intrarenal stones. No hydronephrosis. Normal ureters. Normal bladder. Stomach/Bowel: Normal stomach. Small bowel and colon are normal in caliber. No wall thickening. No inflammation. There are scattered colonic diverticula. Normal appendix visualized. Vascular/Lymphatic: Minimal aortic atherosclerosis. No aneurysm. No other vascular abnormality. No enlarged lymph nodes. Reproductive: Status post hysterectomy. No adnexal masses. Other: No abdominal wall hernia or abnormality. No abdominopelvic ascites. Musculoskeletal: No fracture or acute finding.  No bone lesion. IMPRESSION: 1. Patchy consolidation in the right upper lobe consistent with pneumonia. There is a smaller similar focus in  the posterolateral right middle lobe. 2. No other acute finding within the chest, abdomen or pelvis. 3. Hepatic steatosis. Electronically Signed   By: Lajean Manes M.D.   On: 08/17/2020 13:12   DG  Chest Portable 1 View  Addendum Date: 08/17/2020   ADDENDUM REPORT: 08/17/2020 11:29 ADDENDUM: These results were called by telephone at the time of interpretation on 08/17/2020 at 11:29 am to provider [facility] , who verbally acknowledged these results. Electronically Signed   By: Zetta Bills M.D.   On: 08/17/2020 11:29   Result Date: 08/17/2020 CLINICAL DATA:  Shortness of breath, cough and body aches EXAM: PORTABLE CHEST 1 VIEW COMPARISON:  Chest x-ray August 17, 2018 FINDINGS: Cardiomediastinal contours are stable and accentuated by portable technique. Nodular density in the RIGHT lung apex is in the area of the first rib and sternal junction but is quite asymmetric and a change from the previous imaging study this measures 1.6 x 1.1 cm. RIGHT hemidiaphragm remains elevated. No sign of lobar consolidative change or evidence of pleural effusion on frontal radiography. No acute skeletal process on limited assessment. IMPRESSION: 1. No acute cardiopulmonary disease. 2. Nodular density in the RIGHT lung apex is quite asymmetric and a change from the previous imaging study measures 1.6 x 1.1 cm. CT of the chest is suggested for further assessment to exclude pulmonary nodule in this location. Infection could potentially have this appearance though unifocal nature and lack of lobar or segmental distribution is concerning for nodule. Could consider short interval follow-up within 8 weeks to ensure resolution as well. 3. Chronic elevation of the RIGHT hemidiaphragm. Call is out to the referring provider to further discuss findings in the above case. Electronically Signed: By: Zetta Bills M.D. On: 08/17/2020 11:18     Medical Consultants:    None.  Anti-Infectives:   Rocephin and azithromycin  Subjective:    [name] she relates she feels about the same.  Objective:    Vitals:   08/17/20 2000 08/17/20 2253 08/18/20 0144 08/18/20 0403  BP: 118/67 113/62  124/61  Pulse: 88  87  94  Resp: 20 18  16   Temp:  98 F (36.7 C)  98.2 F (36.8 C)  TempSrc:  Oral  Oral  SpO2: 94% 98%  97%  Weight:   67.9 kg   Height:   5' (1.524 m)    SpO2: 97 %   Intake/Output Summary (Last 24 hours) at 08/18/2020 0713 Last data filed at 08/18/2020 0404 Gross per 24 hour  Intake 878.24 ml  Output 800 ml  Net 78.24 ml   Filed Weights   08/17/20 0922 08/18/20 0144  Weight: 65.8 kg 67.9 kg    Exam: General exam: In no acute distress. Respiratory system: Good air movement and crackles on the right lower lobe Cardiovascular system: S1 & S2 heard, RRR. No JVD. Gastrointestinal system: Abdomen is nondistended, soft and nontender.  Central nervous system: Alert and oriented x3 slow to respond nonfocal Extremities: No pedal edema. Skin: No rashes, lesions or ulcers  Data Reviewed:    Labs: Basic Metabolic Panel: Recent Labs  Lab 08/17/20 1047 08/18/20 0115  NA 137 142  K 3.9 3.6  CL 106 111  CO2 20* 20*  GLUCOSE 219* 147*  BUN 8 6  CREATININE 0.94 0.80  CALCIUM 9.9 9.8   GFR Estimated Creatinine Clearance: 65.9 mL/min (by C-G formula based on SCr of 0.8 mg/dL). Liver Function Tests: Recent Labs  Lab 08/17/20 1047  AST 30  ALT 43  ALKPHOS 91  BILITOT 1.0  PROT 7.8  ALBUMIN 4.3   Recent Labs  Lab 08/17/20 1047  LIPASE 21   No results for input(s): AMMONIA in the last 168 hours. Coagulation profile No results for input(s): INR, PROTIME in the last 168 hours. COVID-19 Labs  No results for input(s): DDIMER, FERRITIN, LDH, CRP in the last 72 hours.  Lab Results  Component Value Date   SARSCOV2NAA NEGATIVE 08/17/2020   [place] Not Detected 08/04/2019   [place] Not Detected 04/12/2019    CBC: Recent Labs  Lab 08/17/20 1047 08/18/20 0115  WBC 13.1* 11.6*  NEUTROABS 11.8*  --   HGB 13.5 12.5  HCT 41.8 38.4  MCV 97.4 98.7  PLT 264 267   Cardiac Enzymes: No results for input(s): CKTOTAL, CKMB, CKMBINDEX, TROPONINI in the last 168  hours. BNP (last 3 results) No results for input(s): PROBNP in the last 8760 hours. CBG: Recent Labs  Lab 08/18/20 0632  GLUCAP 121*   D-Dimer: No results for input(s): DDIMER in the last 72 hours. Hgb A1c: Recent Labs    08/18/20 0115  HGBA1C 5.2   Lipid Profile: No results for input(s): CHOL, HDL, LDLCALC, TRIG, CHOLHDL, LDLDIRECT in the last 72 hours. Thyroid function studies: No results for input(s): TSH, T4TOTAL, T3FREE, THYROIDAB in the last 72 hours.  Invalid input(s): FREET3 Anemia work up: No results for input(s): VITAMINB12, FOLATE, FERRITIN, TIBC, IRON, RETICCTPCT in the last 72 hours. Sepsis Labs: Recent Labs  Lab 08/17/20 1047 08/17/20 1048 08/17/20 1302 08/18/20 0115  WBC 13.1*  --   --  11.6*  LATICACIDVEN  --  1.7 2.1*  --    Microbiology Recent Results (from the past 240 hour(s))  SARS CORONAVIRUS 2 (TAT 6-24 HRS) Nasopharyngeal Nasopharyngeal Swab     Status: None   Collection Time: 08/17/20 10:48 AM   Specimen: Nasopharyngeal Swab  Result Value Ref Range Status   SARS Coronavirus 2 NEGATIVE NEGATIVE Final    Comment: (NOTE) SARS-CoV-2 target nucleic acids are NOT DETECTED.  The SARS-CoV-2 RNA is generally detectable in upper and lower respiratory specimens during the acute phase of infection. Negative results do not preclude SARS-CoV-2 infection, do not rule out co-infections with other pathogens, and should not be used as the sole basis for treatment or other patient management decisions. Negative results must be combined with clinical observations, patient history, and epidemiological information. The expected result is Negative.  Fact Sheet for Patients: SugarRoll.be  Fact Sheet for Healthcare Providers: https://www.woods-mathews.com/  This test is not yet approved or cleared by the [place] FDA and  has been authorized for detection and/or diagnosis of SARS-CoV-2 by FDA under an Emergency  Use Authorization (EUA). This EUA will remain  in effect (meaning this test can be used) for the duration of the COVID-19 declaration under Se ction 564(b)(1) of the Act, 21 U.S.C. section 360bbb-3(b)(1), unless the authorization is terminated or revoked sooner.  Performed at [facility], [place] [address], [place], [place] [address]      Medications:   . enoxaparin (LOVENOX) injection  40 mg Subcutaneous Daily   Continuous Infusions: . sodium chloride 100 mL/hr at 08/18/20 0042  . azithromycin    . cefTRIAXone (ROCEPHIN)  IV        LOS: 1 day   Charlynne Cousins  [facility]  08/18/2020, 7:13 AM

## 2020-08-19 DIAGNOSIS — J189 Pneumonia, unspecified organism: Secondary | ICD-10-CM | POA: Diagnosis not present

## 2020-08-19 DIAGNOSIS — J9601 Acute respiratory failure with hypoxia: Secondary | ICD-10-CM | POA: Diagnosis not present

## 2020-08-19 DIAGNOSIS — G9341 Metabolic encephalopathy: Secondary | ICD-10-CM | POA: Diagnosis not present

## 2020-08-19 LAB — BASIC METABOLIC PANEL
Anion gap: 9 (ref 5–15)
BUN: <5 mg/dL — ABNORMAL LOW (ref 6–20)
CO2: 22 mmol/L (ref 22–32)
Calcium: 9.3 mg/dL (ref 8.9–10.3)
Chloride: 113 mmol/L — ABNORMAL HIGH (ref 98–111)
Creatinine, Ser: 0.75 mg/dL (ref 0.44–1.00)
GFR, Estimated: >60 mL/min (ref >60)
Glucose, Bld: 101 mg/dL — ABNORMAL HIGH (ref 70–99)
Potassium: 3.3 mmol/L — ABNORMAL LOW (ref 3.5–5.1)
Sodium: 144 mmol/L (ref 135–145)

## 2020-08-19 LAB — CBC
HCT: 34.6 % — ABNORMAL LOW (ref 36.0–46.0)
Hemoglobin: 11.4 g/dL — ABNORMAL LOW (ref 12.0–15.0)
MCH: 32.3 pg (ref 26.0–34.0)
MCHC: 32.9 g/dL (ref 30.0–36.0)
MCV: 98 fL (ref 80.0–100.0)
Platelets: 239 10*3/uL (ref 150–400)
RBC: 3.53 MIL/uL — ABNORMAL LOW (ref 3.87–5.11)
RDW: 12.9 % (ref 11.5–15.5)
WBC: 5.1 10*3/uL (ref 4.0–10.5)
nRBC: 0 % (ref 0.0–0.2)

## 2020-08-19 MED ORDER — AMOXICILLIN-POT CLAVULANATE 875-125 MG PO TABS
1.0000 | ORAL_TABLET | Freq: Two times a day (BID) | ORAL | Status: DC
  Administered 2020-08-19 – 2020-08-20 (×3): 1 via ORAL
  Filled 2020-08-19 (×2): qty 1

## 2020-08-19 MED ORDER — AZITHROMYCIN 250 MG PO TABS
500.0000 mg | ORAL_TABLET | Freq: Every day | ORAL | Status: DC
  Administered 2020-08-19 – 2020-08-20 (×2): 500 mg via ORAL
  Filled 2020-08-19 (×2): qty 2

## 2020-08-19 NOTE — Progress Notes (Signed)
[facility] PROGRESS NOTE    Progress Note  [name]  [id number] DOB: Feb 10, 1961 DOA: 08/17/2020 PCP: [name], [name] G, MD     Brief Narrative:   [name] is an 60 y.o. female past medical history significant for depression anxiety IBS comes to the emergency room for worsening cough shortness of breath and malaise, she was recently seen by her PCP on 08/14/2020, in the ED was found febrile satting in the 90s CT of the head and C-spine negative for acute findings.  CT scan of the abdomen and pelvis catch the lower right lower lobe consolidation.  Assessment/Plan:   Severe sepsis secondary to Community acquired pneumonia of right lung: Continue antibiotics. Culture data has remained negative till date, she has remained afebrile leukocytosis resolved. We will transition IV antibiotics to orals.  Acute metabolic encephalopathy: Likely due to infectious etiology, CT scan of the head showed no acute findings. Now resolved.  Hyperglycemia: With an A1c of 5.5 in April 2021.  Continue sliding scale insulin.  Schizoaffective disorder, bipolar type ([place]) Resume home medications.  DVT prophylaxis: Lovenox Family Communication:none Status is: Inpatient  Remains inpatient appropriate because:Hemodynamically unstable   Dispo: The patient is from: Home              Anticipated d/c is to: Home              Anticipated d/c date is: 2 days              Patient currently is not medically stable to d/c.        Code Status:     Code Status Orders  (From admission, onward)         Start     Ordered   08/17/20 2345  Full code  Continuous        08/17/20 2347        Code Status History    Date Active Date Inactive Code Status Order ID Comments User Context   06/19/2015 1544 06/27/2015 1813 Full Code [id number]  [name] Pour, NP Inpatient   06/18/2015 1615 06/19/2015 1544 Full Code [id number]  [name] Manifold, MD ED   05/21/2014 1117 05/23/2014 1608 Full  Code [id number]  [name] Mires, MD ED   05/18/2014 1215 05/19/2014 1814 Full Code [id number]  [name] Dakin, MD ED   05/03/2014 1433 05/17/2014 1919 Full Code [id number]  [name] Alert, MD Inpatient   05/01/2014 0224 05/03/2014 1433 Full Code [id number]  [name] Baker, MD Inpatient   04/12/2014 1604 04/24/2014 1650 Full Code [id number]  [name] Reamer, MD Inpatient   04/12/2014 1604 04/12/2014 1604 Full Code [id number]  [name] Reamer, MD Inpatient   04/12/2014 0238 04/12/2014 1604 Full Code [id number]  [name] Drape, MD ED   02/03/2013 0207 02/03/2013 1504 Full Code [id number]  [name] Drape, MD ED   Advance Care Planning Activity        IV Access:    Peripheral IV   Procedures and diagnostic studies:   DG Pelvis 1-2 Views  Result Date: 08/17/2020 CLINICAL DATA:  Status post fall unable to walk. EXAM: PELVIS - 1-2 VIEW COMPARISON:  July 03, 2007 FINDINGS: There is no evidence of pelvic fracture or diastasis. No pelvic bone lesions are seen. IMPRESSION: Negative. Electronically Signed   By: [name] M.D.   On: 08/17/2020 11:15   CT Head Wo Contrast  Result Date: 08/17/2020 CLINICAL DATA:  Headache and dizziness after fall 6 months ago. EXAM: CT HEAD  WITHOUT CONTRAST CT CERVICAL SPINE WITHOUT CONTRAST TECHNIQUE: Multidetector CT imaging of the head and cervical spine was performed following the standard protocol without intravenous contrast. Multiplanar CT image reconstructions of the cervical spine were also generated. COMPARISON:  June 15, 2020. FINDINGS: CT HEAD FINDINGS Brain: Mild chronic ischemic white matter disease. No mass effect or midline shift is noted. Ventricular size is within normal limits. There is no evidence of mass lesion, hemorrhage or acute infarction. Vascular: No hyperdense vessel or unexpected calcification. Skull: Normal. Negative for fracture or focal lesion. Sinuses/Orbits: Mild bilateral ethmoid and maxillary sinusitis. Other: None. CT CERVICAL SPINE  FINDINGS Alignment: Normal. Skull base and vertebrae: No acute fracture. No primary bone lesion or focal pathologic process. Soft tissues and spinal canal: No prevertebral fluid or swelling. No visible canal hematoma. 4.9 cm right thyroid nodule is noted. Disc levels:  Normal. Upper chest: Negative. Other: None. IMPRESSION: 1. Mild chronic ischemic white matter disease. Mild bilateral ethmoid and maxillary sinusitis. No acute intracranial abnormality seen. 2. No significant abnormality seen in the cervical spine. 3. 4.9 cm right thyroid nodule is noted. This has been evaluated on previous imaging. (ref: J Am Coll Radiol. 2015 Feb;12(2): 143-50). Electronically Signed   By: [name] M.D.   On: 08/17/2020 11:22   CT Chest W Contrast  Result Date: 08/17/2020 CLINICAL DATA:  Vomiting since last night with weakness, body aches, confusion, headache and dizziness. EXAM: CT CHEST, ABDOMEN, AND PELVIS WITH CONTRAST TECHNIQUE: Multidetector CT imaging of the chest, abdomen and pelvis was performed following the standard protocol during bolus administration of intravenous contrast. CONTRAST:  157mL OMNIPAQUE IOHEXOL 300 MG/ML  SOLN COMPARISON:  11/16/2012 FINDINGS: CT CHEST FINDINGS Cardiovascular: Heart normal in size and configuration. No pericardial effusion or coronary artery calcifications. Normal great vessels. Mediastinum/Nodes: Enlarged right thyroid lobe with a heterogeneous nodule. Patient has previously undergone thyroid ultrasound and thyroid nodule biopsy in 2016 July and August, with follow-up thyroid ultrasound on 02/12/2017. No neck base, axillary, mediastinal or hilar masses or enlarged lymph nodes. Normal trachea and esophagus. Lungs/Pleura: Patchy airspace consolidation in the right upper lobe. Small area similar consolidation in the posterolateral right middle lobe. Discoid and minor peripheral hazy opacity in the right lower lobe, likely atelectasis. Left lung is clear. No pleural effusion or  pneumothorax. Musculoskeletal: No chest wall mass or suspicious bone lesions identified. CT ABDOMEN PELVIS FINDINGS Hepatobiliary: Liver normal in overall size. Decreased liver attenuation consistent with fatty infiltration. No mass or focal lesion. Status post cholecystectomy. No bile duct dilation. Pancreas: Choose 1 Spleen: Normal in size without focal abnormality. Adrenals/Urinary Tract: No adrenal masses. There are several bilateral subcentimeter low-density renal masses consistent with cysts. Kidneys are normal in size, orientation and position with symmetric enhancement and excretion. No intrarenal stones. No hydronephrosis. Normal ureters. Normal bladder. Stomach/Bowel: Normal stomach. Small bowel and colon are normal in caliber. No wall thickening. No inflammation. There are scattered colonic diverticula. Normal appendix visualized. Vascular/Lymphatic: Minimal aortic atherosclerosis. No aneurysm. No other vascular abnormality. No enlarged lymph nodes. Reproductive: Status post hysterectomy. No adnexal masses. Other: No abdominal wall hernia or abnormality. No abdominopelvic ascites. Musculoskeletal: No fracture or acute finding.  No bone lesion. IMPRESSION: 1. Patchy consolidation in the right upper lobe consistent with pneumonia. There is a smaller similar focus in the posterolateral right middle lobe. 2. No other acute finding within the chest, abdomen or pelvis. 3. Hepatic steatosis. Electronically Signed   By: Lajean Manes M.D.   On: 08/17/2020 13:12  CT Cervical Spine Wo Contrast  Result Date: 08/17/2020 CLINICAL DATA:  Headache and dizziness after fall 6 months ago. EXAM: CT HEAD WITHOUT CONTRAST CT CERVICAL SPINE WITHOUT CONTRAST TECHNIQUE: Multidetector CT imaging of the head and cervical spine was performed following the standard protocol without intravenous contrast. Multiplanar CT image reconstructions of the cervical spine were also generated. COMPARISON:  June 15, 2020. FINDINGS: CT  HEAD FINDINGS Brain: Mild chronic ischemic white matter disease. No mass effect or midline shift is noted. Ventricular size is within normal limits. There is no evidence of mass lesion, hemorrhage or acute infarction. Vascular: No hyperdense vessel or unexpected calcification. Skull: Normal. Negative for fracture or focal lesion. Sinuses/Orbits: Mild bilateral ethmoid and maxillary sinusitis. Other: None. CT CERVICAL SPINE FINDINGS Alignment: Normal. Skull base and vertebrae: No acute fracture. No primary bone lesion or focal pathologic process. Soft tissues and spinal canal: No prevertebral fluid or swelling. No visible canal hematoma. 4.9 cm right thyroid nodule is noted. Disc levels:  Normal. Upper chest: Negative. Other: None. IMPRESSION: 1. Mild chronic ischemic white matter disease. Mild bilateral ethmoid and maxillary sinusitis. No acute intracranial abnormality seen. 2. No significant abnormality seen in the cervical spine. 3. 4.9 cm right thyroid nodule is noted. This has been evaluated on previous imaging. (ref: J Am Coll Radiol. 2015 Feb;12(2): 143-50). Electronically Signed   By: Lupita Raider M.D.   On: 08/17/2020 11:22   CT ABDOMEN PELVIS W CONTRAST  Result Date: 08/17/2020 CLINICAL DATA:  Vomiting since last night with weakness, body aches, confusion, headache and dizziness. EXAM: CT CHEST, ABDOMEN, AND PELVIS WITH CONTRAST TECHNIQUE: Multidetector CT imaging of the chest, abdomen and pelvis was performed following the standard protocol during bolus administration of intravenous contrast. CONTRAST:  OMNIPAQUE IOHEXOL 300 MG/ML  SOLN COMPARISON:  11/16/2012 FINDINGS: CT CHEST FINDINGS Cardiovascular: Heart normal in size and configuration. No pericardial effusion or coronary artery calcifications. Normal great vessels. Mediastinum/Nodes: Enlarged right thyroid lobe with a heterogeneous nodule. Patient has previously undergone thyroid ultrasound and thyroid nodule biopsy in 2016 July and  August, with follow-up thyroid ultrasound on 02/12/2017. No neck base, axillary, mediastinal or hilar masses or enlarged lymph nodes. Normal trachea and esophagus. Lungs/Pleura: Patchy airspace consolidation in the right upper lobe. Small area similar consolidation in the posterolateral right middle lobe. Discoid and minor peripheral hazy opacity in the right lower lobe, likely atelectasis. Left lung is clear. No pleural effusion or pneumothorax. Musculoskeletal: No chest wall mass or suspicious bone lesions identified. CT ABDOMEN PELVIS FINDINGS Hepatobiliary: Liver normal in overall size. Decreased liver attenuation consistent with fatty infiltration. No mass or focal lesion. Status post cholecystectomy. No bile duct dilation. Pancreas: Choose 1 Spleen: Normal in size without focal abnormality. Adrenals/Urinary Tract: No adrenal masses. There are several bilateral subcentimeter low-density renal masses consistent with cysts. Kidneys are normal in size, orientation and position with symmetric enhancement and excretion. No intrarenal stones. No hydronephrosis. Normal ureters. Normal bladder. Stomach/Bowel: Normal stomach. Small bowel and colon are normal in caliber. No wall thickening. No inflammation. There are scattered colonic diverticula. Normal appendix visualized. Vascular/Lymphatic: Minimal aortic atherosclerosis. No aneurysm. No other vascular abnormality. No enlarged lymph nodes. Reproductive: Status post hysterectomy. No adnexal masses. Other: No abdominal wall hernia or abnormality. No abdominopelvic ascites. Musculoskeletal: No fracture or acute finding.  No bone lesion. IMPRESSION: 1. Patchy consolidation in the right upper lobe consistent with pneumonia. There is a smaller similar focus in the posterolateral right middle lobe. 2. No other acute  finding within the chest, abdomen or pelvis. 3. Hepatic steatosis. Electronically Signed   By: Lajean Manes M.D.   On: 08/17/2020 13:12   DG Chest Portable 1  View  Addendum Date: 08/17/2020   ADDENDUM REPORT: 08/17/2020 11:29 ADDENDUM: These results were called by telephone at the time of interpretation on 08/17/2020 at 11:29 am to provider [facility] , who verbally acknowledged these results. Electronically Signed   By: Zetta Bills M.D.   On: 08/17/2020 11:29   Result Date: 08/17/2020 CLINICAL DATA:  Shortness of breath, cough and body aches EXAM: PORTABLE CHEST 1 VIEW COMPARISON:  Chest x-ray August 17, 2018 FINDINGS: Cardiomediastinal contours are stable and accentuated by portable technique. Nodular density in the RIGHT lung apex is in the area of the first rib and sternal junction but is quite asymmetric and a change from the previous imaging study this measures 1.6 x 1.1 cm. RIGHT hemidiaphragm remains elevated. No sign of lobar consolidative change or evidence of pleural effusion on frontal radiography. No acute skeletal process on limited assessment. IMPRESSION: 1. No acute cardiopulmonary disease. 2. Nodular density in the RIGHT lung apex is quite asymmetric and a change from the previous imaging study measures 1.6 x 1.1 cm. CT of the chest is suggested for further assessment to exclude pulmonary nodule in this location. Infection could potentially have this appearance though unifocal nature and lack of lobar or segmental distribution is concerning for nodule. Could consider short interval follow-up within 8 weeks to ensure resolution as well. 3. Chronic elevation of the RIGHT hemidiaphragm. Call is out to the referring provider to further discuss findings in the above case. Electronically Signed: By: Zetta Bills M.D. On: 08/17/2020 11:18     Medical Consultants:    None.  Anti-Infectives:   Augmentin and azithromycin  Subjective:    [name] feels a lot better than yesterday.  Objective:    Vitals:   08/18/20 2124 08/19/20 0601 08/19/20 0744 08/19/20 0827  BP: 103/67 126/67 117/90   Pulse: 81 85 89 85  Resp:   18 18 18   Temp: 98 F (36.7 C) 98.5 F (36.9 C) 97.9 F (36.6 C)   TempSrc: Oral Oral Oral   SpO2: 95% 95% 95% 94%  Weight:  67.4 kg    Height:       SpO2: 94 %   Intake/Output Summary (Last 24 hours) at 08/19/2020 0916 Last data filed at 08/19/2020 0745 Gross per 24 hour  Intake 940 ml  Output 2501 ml  Net -1561 ml   Filed Weights   08/17/20 0922 08/18/20 0144 08/19/20 0601  Weight: 65.8 kg 67.9 kg 67.4 kg    Exam: General exam: In no acute distress. Respiratory system: Good air movement and clear to auscultation. Cardiovascular system: S1 & S2 heard, RRR. No JVD. Gastrointestinal system: Abdomen is nondistended, soft and nontender.  Extremities: No pedal edema. Skin: No rashes, lesions or ulcers  Data Reviewed:    Labs: Basic Metabolic Panel: Recent Labs  Lab 08/17/20 1047 08/18/20 0115 08/19/20 0312  NA 137 142 144  K 3.9 3.6 3.3*  CL 106 111 113*  CO2 20* 20* 22  GLUCOSE 219* 147* 101*  BUN 8 6 <5*  CREATININE 0.94 0.80 0.75  CALCIUM 9.9 9.8 9.3   GFR Estimated Creatinine Clearance: 65.7 mL/min (by C-[name] formula based on SCr of 0.75 mg/dL). Liver Function Tests: Recent Labs  Lab 08/17/20 1047  AST 30  ALT 43  ALKPHOS 91  BILITOT 1.0  PROT 7.8  ALBUMIN 4.3   Recent Labs  Lab 08/17/20 1047  LIPASE 21   No results for input(s): AMMONIA in the last 168 hours. Coagulation profile No results for input(s): INR, PROTIME in the last 168 hours. COVID-19 Labs  No results for input(s): DDIMER, FERRITIN, LDH, CRP in the last 72 hours.  Lab Results  Component Value Date   SARSCOV2NAA NEGATIVE 08/17/2020   [place] Not Detected 08/04/2019   [place] Not Detected 04/12/2019    CBC: Recent Labs  Lab 08/17/20 1047 08/18/20 0115 08/19/20 0312  WBC 13.1* 11.6* 5.1  NEUTROABS 11.8*  --   --   HGB 13.5 12.5 11.4*  HCT 41.8 38.4 34.6*  MCV 97.4 98.7 98.0  PLT 264 267 239   Cardiac Enzymes: No results for input(s): CKTOTAL, CKMB,  CKMBINDEX, TROPONINI in the last 168 hours. BNP (last 3 results) No results for input(s): PROBNP in the last 8760 hours. CBG: Recent Labs  Lab 08/18/20 0632 08/18/20 2043  GLUCAP 121* 77   D-Dimer: No results for input(s): DDIMER in the last 72 hours. Hgb A1c: Recent Labs    08/18/20 0115  HGBA1C 5.2   Lipid Profile: No results for input(s): CHOL, HDL, LDLCALC, TRIG, CHOLHDL, LDLDIRECT in the last 72 hours. Thyroid function studies: No results for input(s): TSH, T4TOTAL, T3FREE, THYROIDAB in the last 72 hours.  Invalid input(s): FREET3 Anemia work up: No results for input(s): VITAMINB12, FOLATE, FERRITIN, TIBC, IRON, RETICCTPCT in the last 72 hours. Sepsis Labs: Recent Labs  Lab 08/17/20 1047 08/17/20 1048 08/17/20 1302 08/18/20 0115 08/19/20 0312  WBC 13.1*  --   --  11.6* 5.1  LATICACIDVEN  --  1.7 2.1*  --   --    Microbiology Recent Results (from the past 240 hour(s))  Culture, blood (single)     Status: None (Preliminary result)   Collection Time: 08/17/20 10:47 AM   Specimen: BLOOD  Result Value Ref Range Status   Specimen Description   Final    BLOOD RIGHT ANTECUBITAL Performed at [facility], [place] [address], [place], [place] [address]    Special Requests   Final    BOTTLES DRAWN AEROBIC AND ANAEROBIC Blood Culture adequate volume Performed at [facility], [address] [place], [place] [address]    Culture   Final    NO GROWTH < 24 HOURS Performed at [facility], [place] [address], [place], [place] [address]    Report Status PENDING  Incomplete  SARS CORONAVIRUS 2 (TAT 6-24 HRS) Nasopharyngeal Nasopharyngeal Swab     Status: None   Collection Time: 08/17/20 10:48 AM   Specimen: Nasopharyngeal Swab  Result Value Ref Range Status   SARS Coronavirus 2 NEGATIVE NEGATIVE Final    Comment: (NOTE) SARS-CoV-2 target nucleic acids are NOT DETECTED.  The SARS-CoV-2 RNA is generally detectable in upper and lower respiratory  specimens during the acute phase of infection. Negative results do not preclude SARS-CoV-2 infection, do not rule out co-infections with other pathogens, and should not be used as the sole basis for treatment or other patient management decisions. Negative results must be combined with clinical observations, patient history, and epidemiological information. The expected result is Negative.  Fact Sheet for Patients: SugarRoll.be  Fact Sheet for Healthcare Providers: https://www.woods-mathews.com/  This test is not yet approved or cleared by the [place] FDA and  has been authorized for detection and/or diagnosis of SARS-CoV-2 by FDA under an Emergency Use Authorization (EUA).  This EUA will remain  in effect (meaning this test can be used) for the duration of the COVID-19 declaration under Se ction 564(b)(1) of the Act, 21 U.S.C. section 360bbb-3(b)(1), unless the authorization is terminated or revoked sooner.  Performed at [facility], [place] [address] [place], [place] [address]      Medications:   . buPROPion  450 mg Oral Daily  . enoxaparin (LOVENOX) injection  40 mg Subcutaneous Daily  . fenofibrate  54 mg Oral Daily  . gabapentin  300 mg Oral QHS  . lithium carbonate  300 mg Oral q morning - 10a  . lithium carbonate  600 mg Oral QHS  . LORazepam  0.5 mg Oral BID  . mometasone-formoterol  2 puff Inhalation BID   Continuous Infusions: . azithromycin 500 mg (08/18/20 1038)  . cefTRIAXone (ROCEPHIN)  IV 2 [name] (08/19/20 0806)      LOS: 2 days   Charlynne Cousins  [facility]  08/19/2020, 9:16 AM

## 2020-08-20 ENCOUNTER — Encounter (HOSPITAL_COMMUNITY): Payer: Self-pay | Admitting: Family Medicine

## 2020-08-20 DIAGNOSIS — R739 Hyperglycemia, unspecified: Secondary | ICD-10-CM | POA: Diagnosis not present

## 2020-08-20 DIAGNOSIS — J9601 Acute respiratory failure with hypoxia: Secondary | ICD-10-CM | POA: Diagnosis not present

## 2020-08-20 DIAGNOSIS — J189 Pneumonia, unspecified organism: Secondary | ICD-10-CM

## 2020-08-20 HISTORY — DX: Pneumonia, unspecified organism: J18.9

## 2020-08-20 LAB — CBC
HCT: 37.4 % (ref 36.0–46.0)
Hemoglobin: 12.3 g/dL (ref 12.0–15.0)
MCH: 31.8 pg (ref 26.0–34.0)
MCHC: 32.9 g/dL (ref 30.0–36.0)
MCV: 96.6 fL (ref 80.0–100.0)
Platelets: 275 10*3/uL (ref 150–400)
RBC: 3.87 MIL/uL (ref 3.87–5.11)
RDW: 12.5 % (ref 11.5–15.5)
WBC: 4.3 10*3/uL (ref 4.0–10.5)
nRBC: 0 % (ref 0.0–0.2)

## 2020-08-20 LAB — LEGIONELLA PNEUMOPHILA SEROGP 1 UR AG: L. pneumophila Serogp 1 Ur Ag: NEGATIVE

## 2020-08-20 LAB — BASIC METABOLIC PANEL
Anion gap: 12 (ref 5–15)
BUN: <5 mg/dL — ABNORMAL LOW (ref 6–20)
CO2: 21 mmol/L — ABNORMAL LOW (ref 22–32)
Calcium: 9.7 mg/dL (ref 8.9–10.3)
Chloride: 112 mmol/L — ABNORMAL HIGH (ref 98–111)
Creatinine, Ser: 0.85 mg/dL (ref 0.44–1.00)
GFR, Estimated: >60 mL/min (ref >60)
Glucose, Bld: 118 mg/dL — ABNORMAL HIGH (ref 70–99)
Potassium: 2.9 mmol/L — ABNORMAL LOW (ref 3.5–5.1)
Sodium: 145 mmol/L (ref 135–145)

## 2020-08-20 MED ORDER — AMOXICILLIN-POT CLAVULANATE 875-125 MG PO TABS: 1.0000 | ORAL_TABLET | Freq: Two times a day (BID) | ORAL | 0 refills | Status: AC

## 2020-08-20 MED ORDER — AZITHROMYCIN 250 MG PO TABS: ORAL_TABLET | ORAL | 0 refills | Status: DC

## 2020-08-20 NOTE — Telephone Encounter (Signed)
Spoke with the pt and offered a virtual visit with another provider as PCP is out of the office today.  Patient stated she is currently in the hospital and will call back for an appt.

## 2020-08-20 NOTE — Discharge Summary (Signed)
Physician Discharge Summary  [name] [id number] DOB: July 04, 1961 DOA: 08/17/2020  PCP: [name], [name], MD  Admit date: 08/17/2020 Discharge date: 08/20/2020  Admitted From: Home Disposition:  Home  Recommendations for Outpatient Follow-up:  1. Follow up with PCP in 1-2 weeks 2. Please obtain BMP/CBC in one week   Home Health:no Equipment/Devices:None  Discharge Condition:Stable CODE STATUS:Full Diet recommendation: Heart Healthy  Brief/Interim Summary: 60 y.o. female past medical history significant for depression anxiety IBS comes to the emergency room for worsening cough shortness of breath and malaise, she was recently seen by her PCP on 08/14/2020, in the ED was found febrile satting in the 90s CT of the head and C-spine negative for acute findings.  CT scan of the abdomen and pelvis catch the lower right lower lobe consolidation  Discharge Diagnoses:  Principal Problem:   Community acquired pneumonia of right lung Active Problems:   Hyperglycemia   Schizoaffective disorder, bipolar type (HCC)   Elevated lactic acid level Severe sepsis secondary to community-acquired pneumonia of the right lower lobe She was mildly hypoxic and started on oxygen and antibiotics she defervesced her hypoxia resolved. Her antibiotics were changed to oral which she will continue as an outpatient.  Acute metabolic encephalopathy: Likely due to infectious etiology CT scan of the head showed no acute findings resolved within 24 hours of hospitalization.  Hyperglycemia: With an A1c of 5.5 in April, she will follow up with her primary care doctor as an outpatient.  Schizoaffective disorder/bipolar type I: No changes made to her medication.   Discharge Instructions  Discharge Instructions    Diet - low sodium heart healthy   Complete by: As directed    Increase activity slowly   Complete by: As directed      Allergies as of 08/20/2020      Reactions   Morphine And Related  Nausea And Vomiting, Nausea Only   Ambien [zolpidem] Other (See Comments)   Per patient this caused her to fall   Duloxetine Swelling, Other (See Comments)   Other reaction(s): SWELLING/EDEMA   Latuda [lurasidone Hcl]    Morphine Nausea Only      Medication List    STOP taking these medications   benzonatate 100 MG capsule Commonly known as: TESSALON     TAKE these medications   albuterol 108 (90 Base) MCG/ACT inhaler Commonly known as: VENTOLIN HFA Inhale 2 puffs into the lungs every 6 (six) hours as needed for wheezing or shortness of breath.   amoxicillin-clavulanate 875-125 MG tablet Commonly known as: AUGMENTIN Take 1 tablet by mouth every 12 (twelve) hours for 2 days.   azithromycin 250 MG tablet Commonly known as: ZITHROMAX Take 1 tablet daily   buPROPion 150 MG 24 hr tablet Commonly known as: WELLBUTRIN XL Take 450 mg by mouth every morning.   fenofibrate 145 MG tablet Commonly known as: Tricor Take 1 tablet (145 mg total) by mouth daily.   fluticasone 50 MCG/ACT nasal spray Commonly known as: FLONASE Place 2 sprays into both nostrils daily.   Fluticasone-Salmeterol 250-50 MCG/DOSE Aepb Commonly known as: Advair Diskus Inhale 1 puff into the lungs 2 (two) times daily.   gabapentin 300 MG capsule Commonly known as: NEURONTIN Take 300 mg by mouth at bedtime.   HYDROcodone-homatropine 5-1.5 MG/5ML syrup Commonly known as: HYCODAN Take 5 mLs by mouth every 12 (twelve) hours as needed for up to 10 days.   hydrOXYzine 25 MG tablet Commonly known as: ATARAX/VISTARIL Take 1 tablet (25 mg total) by  mouth 3 (three) times daily as needed for itching.   lithium carbonate 300 MG CR tablet Commonly known as: LITHOBID Take 2 tablets (600 mg total) by mouth every evening. What changed:   how much to take  when to take this  additional instructions   LORazepam 0.5 MG tablet Commonly known as: ATIVAN Take 0.5 mg by mouth 2 (two) times daily. Take an extra  0.5mg  as needed.   meclizine 25 MG tablet Commonly known as: ANTIVERT Take 1 tablet (25 mg total) by mouth 3 (three) times daily as needed for dizziness.   omeprazole 40 MG capsule Commonly known as: PRILOSEC Take 40 mg by mouth every evening.   ondansetron 4 MG tablet Commonly known as: Zofran Take 1 tablet (4 mg total) by mouth daily as needed for nausea.   Vitamin D 50 MCG (2000 UT) tablet Take 2,000 Units by mouth daily.       Allergies  Allergen Reactions  . Morphine And Related Nausea And Vomiting and Nausea Only  . Ambien [Zolpidem] Other (See Comments)    Per patient this caused her to fall  . Duloxetine Swelling and Other (See Comments)    Other reaction(s): SWELLING/EDEMA  . Latuda [Lurasidone Hcl]   . Morphine Nausea Only    Consultations:  None   Procedures/Studies: DG Pelvis 1-2 Views  Result Date: 08/17/2020 CLINICAL DATA:  Status post fall unable to walk. EXAM: PELVIS - 1-2 VIEW COMPARISON:  July 03, 2007 FINDINGS: There is no evidence of pelvic fracture or diastasis. No pelvic bone lesions are seen. IMPRESSION: Negative. Electronically Signed   By: Abelardo Diesel M.D.   On: 08/17/2020 11:15   CT Head Wo Contrast  Result Date: 08/17/2020 CLINICAL DATA:  Headache and dizziness after fall 6 months ago. EXAM: CT HEAD WITHOUT CONTRAST CT CERVICAL SPINE WITHOUT CONTRAST TECHNIQUE: Multidetector CT imaging of the head and cervical spine was performed following the standard protocol without intravenous contrast. Multiplanar CT image reconstructions of the cervical spine were also generated. COMPARISON:  June 15, 2020. FINDINGS: CT HEAD FINDINGS Brain: Mild chronic ischemic white matter disease. No mass effect or midline shift is noted. Ventricular size is within normal limits. There is no evidence of mass lesion, hemorrhage or acute infarction. Vascular: No hyperdense vessel or unexpected calcification. Skull: Normal. Negative for fracture or focal lesion.  Sinuses/Orbits: Mild bilateral ethmoid and maxillary sinusitis. Other: None. CT CERVICAL SPINE FINDINGS Alignment: Normal. Skull base and vertebrae: No acute fracture. No primary bone lesion or focal pathologic process. Soft tissues and spinal canal: No prevertebral fluid or swelling. No visible canal hematoma. 4.9 cm right thyroid nodule is noted. Disc levels:  Normal. Upper chest: Negative. Other: None. IMPRESSION: 1. Mild chronic ischemic white matter disease. Mild bilateral ethmoid and maxillary sinusitis. No acute intracranial abnormality seen. 2. No significant abnormality seen in the cervical spine. 3. 4.9 cm right thyroid nodule is noted. This has been evaluated on previous imaging. (ref: J Am Coll Radiol. 2015 Feb;12(2): 143-50). Electronically Signed   By: Marijo Conception M.D.   On: 08/17/2020 11:22   CT Chest W Contrast  Result Date: 08/17/2020 CLINICAL DATA:  Vomiting since last night with weakness, body aches, confusion, headache and dizziness. EXAM: CT CHEST, ABDOMEN, AND PELVIS WITH CONTRAST TECHNIQUE: Multidetector CT imaging of the chest, abdomen and pelvis was performed following the standard protocol during bolus administration of intravenous contrast. CONTRAST:  150mL OMNIPAQUE IOHEXOL 300 MG/ML  SOLN COMPARISON:  11/16/2012 FINDINGS: CT CHEST FINDINGS  Cardiovascular: Heart normal in size and configuration. No pericardial effusion or coronary artery calcifications. Normal great vessels. Mediastinum/Nodes: Enlarged right thyroid lobe with a heterogeneous nodule. Patient has previously undergone thyroid ultrasound and thyroid nodule biopsy in 2016 July and August, with follow-up thyroid ultrasound on 02/12/2017. No neck base, axillary, mediastinal or hilar masses or enlarged lymph nodes. Normal trachea and esophagus. Lungs/Pleura: Patchy airspace consolidation in the right upper lobe. Small area similar consolidation in the posterolateral right middle lobe. Discoid and minor peripheral hazy  opacity in the right lower lobe, likely atelectasis. Left lung is clear. No pleural effusion or pneumothorax. Musculoskeletal: No chest wall mass or suspicious bone lesions identified. CT ABDOMEN PELVIS FINDINGS Hepatobiliary: Liver normal in overall size. Decreased liver attenuation consistent with fatty infiltration. No mass or focal lesion. Status post cholecystectomy. No bile duct dilation. Pancreas: Choose 1 Spleen: Normal in size without focal abnormality. Adrenals/Urinary Tract: No adrenal masses. There are several bilateral subcentimeter low-density renal masses consistent with cysts. Kidneys are normal in size, orientation and position with symmetric enhancement and excretion. No intrarenal stones. No hydronephrosis. Normal ureters. Normal bladder. Stomach/Bowel: Normal stomach. Small bowel and colon are normal in caliber. No wall thickening. No inflammation. There are scattered colonic diverticula. Normal appendix visualized. Vascular/Lymphatic: Minimal aortic atherosclerosis. No aneurysm. No other vascular abnormality. No enlarged lymph nodes. Reproductive: Status post hysterectomy. No adnexal masses. Other: No abdominal wall hernia or abnormality. No abdominopelvic ascites. Musculoskeletal: No fracture or acute finding.  No bone lesion. IMPRESSION: 1. Patchy consolidation in the right upper lobe consistent with pneumonia. There is a smaller similar focus in the posterolateral right middle lobe. 2. No other acute finding within the chest, abdomen or pelvis. 3. Hepatic steatosis. Electronically Signed   By: Lajean Manes M.D.   On: 08/17/2020 13:12   CT Cervical Spine Wo Contrast  Result Date: 08/17/2020 CLINICAL DATA:  Headache and dizziness after fall 6 months ago. EXAM: CT HEAD WITHOUT CONTRAST CT CERVICAL SPINE WITHOUT CONTRAST TECHNIQUE: Multidetector CT imaging of the head and cervical spine was performed following the standard protocol without intravenous contrast. Multiplanar CT image  reconstructions of the cervical spine were also generated. COMPARISON:  June 15, 2020. FINDINGS: CT HEAD FINDINGS Brain: Mild chronic ischemic white matter disease. No mass effect or midline shift is noted. Ventricular size is within normal limits. There is no evidence of mass lesion, hemorrhage or acute infarction. Vascular: No hyperdense vessel or unexpected calcification. Skull: Normal. Negative for fracture or focal lesion. Sinuses/Orbits: Mild bilateral ethmoid and maxillary sinusitis. Other: None. CT CERVICAL SPINE FINDINGS Alignment: Normal. Skull base and vertebrae: No acute fracture. No primary bone lesion or focal pathologic process. Soft tissues and spinal canal: No prevertebral fluid or swelling. No visible canal hematoma. 4.9 cm right thyroid nodule is noted. Disc levels:  Normal. Upper chest: Negative. Other: None. IMPRESSION: 1. Mild chronic ischemic white matter disease. Mild bilateral ethmoid and maxillary sinusitis. No acute intracranial abnormality seen. 2. No significant abnormality seen in the cervical spine. 3. 4.9 cm right thyroid nodule is noted. This has been evaluated on previous imaging. (ref: J Am Coll Radiol. 2015 Feb;12(2): 143-50). Electronically Signed   By: Marijo Conception M.D.   On: 08/17/2020 11:22   CT ABDOMEN PELVIS W CONTRAST  Result Date: 08/17/2020 CLINICAL DATA:  Vomiting since last night with weakness, body aches, confusion, headache and dizziness. EXAM: CT CHEST, ABDOMEN, AND PELVIS WITH CONTRAST TECHNIQUE: Multidetector CT imaging of the chest, abdomen and pelvis was performed  following the standard protocol during bolus administration of intravenous contrast. CONTRAST:  156mL OMNIPAQUE IOHEXOL 300 MG/ML  SOLN COMPARISON:  11/16/2012 FINDINGS: CT CHEST FINDINGS Cardiovascular: Heart normal in size and configuration. No pericardial effusion or coronary artery calcifications. Normal great vessels. Mediastinum/Nodes: Enlarged right thyroid lobe with a heterogeneous  nodule. Patient has previously undergone thyroid ultrasound and thyroid nodule biopsy in 2016 July and August, with follow-up thyroid ultrasound on 02/12/2017. No neck base, axillary, mediastinal or hilar masses or enlarged lymph nodes. Normal trachea and esophagus. Lungs/Pleura: Patchy airspace consolidation in the right upper lobe. Small area similar consolidation in the posterolateral right middle lobe. Discoid and minor peripheral hazy opacity in the right lower lobe, likely atelectasis. Left lung is clear. No pleural effusion or pneumothorax. Musculoskeletal: No chest wall mass or suspicious bone lesions identified. CT ABDOMEN PELVIS FINDINGS Hepatobiliary: Liver normal in overall size. Decreased liver attenuation consistent with fatty infiltration. No mass or focal lesion. Status post cholecystectomy. No bile duct dilation. Pancreas: Choose 1 Spleen: Normal in size without focal abnormality. Adrenals/Urinary Tract: No adrenal masses. There are several bilateral subcentimeter low-density renal masses consistent with cysts. Kidneys are normal in size, orientation and position with symmetric enhancement and excretion. No intrarenal stones. No hydronephrosis. Normal ureters. Normal bladder. Stomach/Bowel: Normal stomach. Small bowel and colon are normal in caliber. No wall thickening. No inflammation. There are scattered colonic diverticula. Normal appendix visualized. Vascular/Lymphatic: Minimal aortic atherosclerosis. No aneurysm. No other vascular abnormality. No enlarged lymph nodes. Reproductive: Status post hysterectomy. No adnexal masses. Other: No abdominal wall hernia or abnormality. No abdominopelvic ascites. Musculoskeletal: No fracture or acute finding.  No bone lesion. IMPRESSION: 1. Patchy consolidation in the right upper lobe consistent with pneumonia. There is a smaller similar focus in the posterolateral right middle lobe. 2. No other acute finding within the chest, abdomen or pelvis. 3. Hepatic  steatosis. Electronically Signed   By: Lajean Manes M.D.   On: 08/17/2020 13:12   DG Chest Portable 1 View  Addendum Date: 08/17/2020   ADDENDUM REPORT: 08/17/2020 11:29 ADDENDUM: These results were called by telephone at the time of interpretation on 08/17/2020 at 11:29 am to provider [facility] , who verbally acknowledged these results. Electronically Signed   By: Zetta Bills M.D.   On: 08/17/2020 11:29   Result Date: 08/17/2020 CLINICAL DATA:  Shortness of breath, cough and body aches EXAM: PORTABLE CHEST 1 VIEW COMPARISON:  Chest x-ray August 17, 2018 FINDINGS: Cardiomediastinal contours are stable and accentuated by portable technique. Nodular density in the RIGHT lung apex is in the area of the first rib and sternal junction but is quite asymmetric and a change from the previous imaging study this measures 1.6 x 1.1 cm. RIGHT hemidiaphragm remains elevated. No sign of lobar consolidative change or evidence of pleural effusion on frontal radiography. No acute skeletal process on limited assessment. IMPRESSION: 1. No acute cardiopulmonary disease. 2. Nodular density in the RIGHT lung apex is quite asymmetric and a change from the previous imaging study measures 1.6 x 1.1 cm. CT of the chest is suggested for further assessment to exclude pulmonary nodule in this location. Infection could potentially have this appearance though unifocal nature and lack of lobar or segmental distribution is concerning for nodule. Could consider short interval follow-up within 8 weeks to ensure resolution as well. 3. Chronic elevation of the RIGHT hemidiaphragm. Call is out to the referring provider to further discuss findings in the above case. Electronically Signed: By: Jewel Baize.D.  On: 08/17/2020 11:18    (Echo, Carotid, EGD, Colonoscopy, ERCP)    Subjective: No new complaints  Discharge Exam: Vitals:   08/19/20 2018 08/20/20 0322  BP:  127/75  Pulse:  79  Resp:  18  Temp:  98.6 F (37 C)   SpO2: 98% 95%   Vitals:   08/19/20 1150 08/19/20 1953 08/19/20 2018 08/20/20 0322  BP: 124/66 123/76  127/75  Pulse: 87 85  79  Resp: 20 17  18   Temp: 98.4 F (36.9 C) 98.4 F (36.9 C)  98.6 F (37 C)  TempSrc: Oral Oral  Oral  SpO2: 94% 96% 98% 95%  Weight:    67.2 kg  Height:        General: Pt is alert, awake, not in acute distress Cardiovascular: RRR, S1/S2 +, no rubs, no gallops Respiratory: CTA bilaterally, no wheezing, no rhonchi Abdominal: Soft, NT, ND, bowel sounds + Extremities: no edema, no cyanosis    The results of significant diagnostics from this hospitalization (including imaging, microbiology, ancillary and laboratory) are listed below for reference.     Microbiology: Recent Results (from the past 240 hour(s))  Culture, blood (single)     Status: None (Preliminary result)   Collection Time: 08/17/20 10:47 AM   Specimen: BLOOD  Result Value Ref Range Status   Specimen Description   Final    BLOOD RIGHT ANTECUBITAL Performed at [facility], [place] [address] [place], [place] [address]    Special Requests   Final    BOTTLES DRAWN AEROBIC AND ANAEROBIC Blood Culture adequate volume Performed at [facility], [place] [place], [place] [address]    Culture   Final    NO GROWTH 2 DAYS Performed at [facility], [place] [address] [place], [place] [address]    Report Status PENDING  Incomplete  SARS CORONAVIRUS 2 (TAT 6-24 HRS) Nasopharyngeal Nasopharyngeal Swab     Status: None   Collection Time: 08/17/20 10:48 AM   Specimen: Nasopharyngeal Swab  Result Value Ref Range Status   SARS Coronavirus 2 NEGATIVE NEGATIVE Final    Comment: (NOTE) SARS-CoV-2 target nucleic acids are NOT DETECTED.  The SARS-CoV-2 RNA is generally detectable in upper and lower respiratory specimens during the acute phase of infection. Negative results do not preclude SARS-CoV-2 infection, do not rule out co-infections with other pathogens, and  should not be used as the sole basis for treatment or other patient management decisions. Negative results must be combined with clinical observations, patient history, and epidemiological information. The expected result is Negative.  Fact Sheet for Patients: SugarRoll.be  Fact Sheet for Healthcare Providers: https://www.woods-mathews.com/  This test is not yet approved or cleared by the [place] FDA and  has been authorized for detection and/or diagnosis of SARS-CoV-2 by FDA under an Emergency Use Authorization (EUA). This EUA will remain  in effect (meaning this test can be used) for the duration of the COVID-19 declaration under Se ction 564(b)(1) of the Act, 21 U.S.C. section 360bbb-3(b)(1), unless the authorization is terminated or revoked sooner.  Performed at [facility], [place] [address] [place], [place] [address]      Labs: BNP (last 3 results) No results for input(s): BNP in the last 8760 hours. Basic Metabolic Panel: Recent Labs  Lab 08/17/20 1047 08/18/20 0115 08/19/20 0312 08/20/20 0348  NA 137 142 144 145  K 3.9 3.6 3.3* 2.9*  CL 106 111 113* 112*  CO2 20* 20* 22  21*  GLUCOSE 219* 147* 101* 118*  BUN 8 6 <5* <5*  CREATININE 0.94 0.80 0.75 0.85  CALCIUM 9.9 9.8 9.3 9.7   Liver Function Tests: Recent Labs  Lab 08/17/20 1047  AST 30  ALT 43  ALKPHOS 91  BILITOT 1.0  PROT 7.8  ALBUMIN 4.3   Recent Labs  Lab 08/17/20 1047  LIPASE 21   No results for input(s): AMMONIA in the last 168 hours. CBC: Recent Labs  Lab 08/17/20 1047 08/18/20 0115 08/19/20 0312 08/20/20 0348  WBC 13.1* 11.6* 5.1 4.3  NEUTROABS 11.8*  --   --   --   HGB 13.5 12.5 11.4* 12.3  HCT 41.8 38.4 34.6* 37.4  MCV 97.4 98.7 98.0 96.6  PLT 264 267 239 275   Cardiac Enzymes: No results for input(s): CKTOTAL, CKMB, CKMBINDEX, TROPONINI in the last 168 hours. BNP: Invalid input(s): POCBNP CBG: Recent Labs  Lab  08/18/20 0632 08/18/20 2043  GLUCAP 121* 77   D-Dimer No results for input(s): DDIMER in the last 72 hours. Hgb A1c Recent Labs    08/18/20 0115  HGBA1C 5.2   Lipid Profile No results for input(s): CHOL, HDL, LDLCALC, TRIG, CHOLHDL, LDLDIRECT in the last 72 hours. Thyroid function studies No results for input(s): TSH, T4TOTAL, T3FREE, THYROIDAB in the last 72 hours.  Invalid input(s): FREET3 Anemia work up No results for input(s): VITAMINB12, FOLATE, FERRITIN, TIBC, IRON, RETICCTPCT in the last 72 hours. Urinalysis    Component Value Date/Time   COLORURINE YELLOW 08/17/2020 1322   APPEARANCEUR CLEAR 08/17/2020 1322   LABSPEC 1.005 08/17/2020 1322   PHURINE 7.5 08/17/2020 1322   GLUCOSEU NEGATIVE 08/17/2020 1322   GLUCOSEU NEGATIVE 10/14/2017 1629   HGBUR NEGATIVE 08/17/2020 1322   HGBUR negative 09/16/2010 1504   BILIRUBINUR NEGATIVE 08/17/2020 1322   BILIRUBINUR n 03/23/2014 0941   KETONESUR NEGATIVE 08/17/2020 1322   PROTEINUR NEGATIVE 08/17/2020 1322   UROBILINOGEN 0.2 10/14/2017 1629   NITRITE NEGATIVE 08/17/2020 1322   LEUKOCYTESUR NEGATIVE 08/17/2020 1322   Sepsis Labs Invalid input(s): PROCALCITONIN,  WBC,  LACTICIDVEN Microbiology Recent Results (from the past 240 hour(s))  Culture, blood (single)     Status: None (Preliminary result)   Collection Time: 08/17/20 10:47 AM   Specimen: BLOOD  Result Value Ref Range Status   Specimen Description   Final    BLOOD RIGHT ANTECUBITAL Performed at [facility], [address] [place], [place] [address]    Special Requests   Final    BOTTLES DRAWN AEROBIC AND ANAEROBIC Blood Culture adequate volume Performed at [facility], [address] [place], [place] [address]    Culture   Final    NO GROWTH 2 DAYS Performed at [facility], [address], [place], [place] [address]    Report Status PENDING  Incomplete  SARS CORONAVIRUS 2 (TAT 6-24 HRS) Nasopharyngeal Nasopharyngeal Swab      Status: None   Collection Time: 08/17/20 10:48 AM   Specimen: Nasopharyngeal Swab  Result Value Ref Range Status   SARS Coronavirus 2 NEGATIVE NEGATIVE Final    Comment: (NOTE) SARS-CoV-2 target nucleic acids are NOT DETECTED.  The SARS-CoV-2 RNA is generally detectable in upper and lower respiratory specimens during the acute phase of infection. Negative results do not preclude SARS-CoV-2 infection, do not rule out co-infections with other pathogens, and should not be used as the sole basis for treatment or other patient management decisions. Negative results must be combined with  clinical observations, patient history, and epidemiological information. The expected result is Negative.  Fact Sheet for Patients: SugarRoll.be  Fact Sheet for Healthcare Providers: https://www.woods-mathews.com/  This test is not yet approved or cleared by the [place] FDA and  has been authorized for detection and/or diagnosis of SARS-CoV-2 by FDA under an Emergency Use Authorization (EUA). This EUA will remain  in effect (meaning this test can be used) for the duration of the COVID-19 declaration under Se ction 564(b)(1) of the Act, 21 U.S.C. section 360bbb-3(b)(1), unless the authorization is terminated or revoked sooner.  Performed at [facility], [place] [address], [place], [place] [address]      Time coordinating discharge: Over 30 minutes  SIGNED:   Charlynne Cousins, MD  [facility] 08/20/2020, 7:30 AM Pager   If 7PM-7AM, please contact night-coverage [email or web address] Password [email or web address]

## 2020-08-20 NOTE — Progress Notes (Signed)
D/C instructions printed and reviewed. No questions asked but encouraged to call with any concerns. IV removed, tolerated well.

## 2020-08-20 NOTE — Plan of Care (Signed)
  Problem: Education: Goal: Knowledge of General Education information will improve Description Including pain rating scale, medication(s)/side effects and non-pharmacologic comfort measures Outcome: Progressing   Problem: Health Behavior/Discharge Planning: Goal: Ability to manage health-related needs will improve Outcome: Progressing   

## 2020-08-22 ENCOUNTER — Telehealth: Payer: Self-pay | Admitting: Family Medicine

## 2020-08-22 LAB — CULTURE, BLOOD (SINGLE)
Culture: NO GROWTH
Special Requests: ADEQUATE

## 2020-08-22 NOTE — Telephone Encounter (Signed)
Transition Care Management Unsuccessful Follow-up Telephone Call  Date of discharge and from where:  08/20/2020 from [facility]   Attempts:  1st Attempt  Reason for unsuccessful TCM follow-up call:  Left voice message

## 2020-08-23 ENCOUNTER — Telehealth: Payer: Self-pay | Admitting: Family Medicine

## 2020-08-23 ENCOUNTER — Other Ambulatory Visit: Payer: Self-pay | Admitting: *Deleted

## 2020-08-23 MED ORDER — FLUCONAZOLE 150 MG PO TABS: 150.0000 mg | ORAL_TABLET | Freq: Once | ORAL | 0 refills | Status: AC

## 2020-08-23 MED ORDER — GUAIFENESIN ER 600 MG PO TB12: 600.0000 mg | ORAL_TABLET | Freq: Two times a day (BID) | ORAL | 0 refills | Status: AC

## 2020-08-23 NOTE — Telephone Encounter (Signed)
Patient is calling back and wanted to let provider know that she is doing good and  with all of the antibiotics she is taking she has developed a yeast infection. Pt wanted to see if something could be sent to [facility] [address], [place] [address]  Phone:  [phone] Fax:  [phone]  CB is [phone]

## 2020-08-23 NOTE — Patient Outreach (Addendum)
[facility]) Care Management  08/23/2020  Jessi Pitstick Burkhammer [birth year] [id number]  Telephone outreach for follow up RED FLAG on EMMI DISCHARGE CALL - Pt has questions.  Spoke with Ms. Kabler, verified her identify and pt gave permission for me to access her EMR.  Pt was recently hospitalized for pneumonia. She says she was treated with numerous antibiotics in the hospital and then sent home on 2 which she has just completed. She says she now has sxs of vaginal yeast infection, itching and discharge, which has happened to her before. She has called her primary care office but her physician in not in and she has not had a call back from the office. NP to call in Rx for diflucan 150 mg po I tab.  She also reports she is concerned that she is still coughing up a lot of mucous despite now being off her antibiotics. Explained that is good since we ant her to get as much mucous out as possible. Reassured her that the antibiotics will still be circulating in her system for several days. Encouraged her to drink plenty of water and to take guaifenesin 600 mg bid until her cough and mucous expectorations have subsided (requested 10 day supply). Also recommended that she only take the Hycodan syrup only at night to quiet her cough and let her sleep.  Provided my number for further outreaches. Will send her a letter with [facility] information for future reference.  Zara Council. Burgess Estelle, MSN, [facility] Gerontological Nurse Practitioner [facility] Care Management [phone]

## 2020-08-23 NOTE — Telephone Encounter (Signed)
She can try OTC Monistat. We can discuss this problem, if not better , during hospital follow up. Thanks, BJ

## 2020-08-24 NOTE — Telephone Encounter (Signed)
Left a detailed message with the information below at the pts cell number. 

## 2020-08-29 ENCOUNTER — Other Ambulatory Visit: Payer: Self-pay

## 2020-08-29 ENCOUNTER — Ambulatory Visit (INDEPENDENT_AMBULATORY_CARE_PROVIDER_SITE_OTHER): Payer: Medicare Other | Admitting: Family Medicine

## 2020-08-29 ENCOUNTER — Encounter: Payer: Self-pay | Admitting: Family Medicine

## 2020-08-29 ENCOUNTER — Inpatient Hospital Stay: Payer: Medicare Other | Admitting: Family Medicine

## 2020-08-29 VITALS — BP 128/80 | HR 93 | Resp 16 | Ht 60.0 in | Wt 149.0 lb

## 2020-08-29 DIAGNOSIS — Z79899 Other long term (current) drug therapy: Secondary | ICD-10-CM

## 2020-08-29 DIAGNOSIS — R2681 Unsteadiness on feet: Secondary | ICD-10-CM | POA: Diagnosis not present

## 2020-08-29 DIAGNOSIS — Y92009 Unspecified place in unspecified non-institutional (private) residence as the place of occurrence of the external cause: Secondary | ICD-10-CM

## 2020-08-29 DIAGNOSIS — R739 Hyperglycemia, unspecified: Secondary | ICD-10-CM | POA: Diagnosis not present

## 2020-08-29 DIAGNOSIS — E538 Deficiency of other specified B group vitamins: Secondary | ICD-10-CM

## 2020-08-29 DIAGNOSIS — W19XXXA Unspecified fall, initial encounter: Secondary | ICD-10-CM

## 2020-08-29 DIAGNOSIS — E876 Hypokalemia: Secondary | ICD-10-CM

## 2020-08-29 LAB — POCT GLYCOSYLATED HEMOGLOBIN (HGB A1C): Hemoglobin A1C: 5 % (ref 4.0–5.6)

## 2020-08-29 NOTE — Patient Instructions (Signed)
A few things to remember from today's visit:   Hyperglycemia - Plan: POC HgB A1c  Hypokalemia - Plan: Basic metabolic panel, Magnesium  Unsteady gait when walking  Fall in home, initial encounter - Plan: Ambulatory referral to [place] deficiency - Plan: Vitamin B12  Lithium use - Plan: TSH  If you need refills please call your pharmacy. Do not use My Chart to request refills or for acute issues that need immediate attention.   Fall prevention. Home health will be arranged.  Neuro referral placed. Please discuss concerns about lithium with your psychiatrist.  Please be sure medication list is accurate. If a new problem present, please set up appointment sooner than planned today.

## 2020-08-29 NOTE — Progress Notes (Signed)
HPI: [name] is a 60 y.o. female, who is here today with her wife to follow on recent hospitalization.  Patient was admitted from 08/17/2020 to 08/20/2020. Presented to the ER because of worsening cough, dyspnea, and malaise + a fall and unable to walk.  Abnormal chest x-ray on 08/17/2020: 1. No acute cardiopulmonary disease. 2. Nodular density in the RIGHT lung apex is quite asymmetric and a change from the previous imaging study measures 1.6 x 1.1 cm. CT of the chest is suggested for further assessment to exclude pulmonary nodule in this location. Infection could potentially have this appearance though unifocal nature and lack of lobar or segmental distribution is concerning for nodule. Could consider short interval follow-up within 8 weeks to ensure resolution as well. 3. Chronic elevation of the RIGHT hemidiaphragm.  Chest CT 07/30/20: 1. Patchy consolidation in the right upper lobe consistent with pneumonia. There is a smaller similar focus in the posterolateral right middle lobe. 2. No other acute finding within the chest, abdomen or pelvis. 3. Hepatic steatosis.   Discharge diagnosis: Community-acquired pneumonia.  She was discharged on Augmentin 875-125 mg twice daily x2 days and azithromycin 250 mg daily x3 days. She is also on fluticasone-salmeterol 250-50 mcg 1 puff twice daily and albuterol inhaler 2 puff every 6 hours as needed for wheezing and shortness of breath. Cough has greatly improved. Occasional wheezing, mainly at night when asleep. O2 sat at home 96%.  She is not having fever, dyspnea, CP, palpitation, or diaphoresis. + Fatigue.  HypoK+: She is not on K+ supplementation.  Lab Results  Component Value Date   CREATININE 0.85 08/20/2020   BUN <5 (L) 08/20/2020   NA 145 08/20/2020   K 2.9 (L) 08/20/2020   CL 112 (H) 08/20/2020   CO2 21 (L) 08/20/2020   Lab Results  Component Value Date   WBC 4.3 08/20/2020   HGB 12.3 08/20/2020   HCT  37.4 08/20/2020   MCV 96.6 08/20/2020   PLT 275 08/20/2020   She had a fall at home 3 days ago when she was in the bathroom.  She has been evaluated by neurologist due to lower extremity weakness sensation. She feels like this time is different, she does not feel weak or lightheaded. She cannot explain problem, sudden onset of balance problem, falls forward. No associated headache,CP,palpitations,N/V,or diaphoresis.  Her wife has noted problem for a few months, gradually getting worse. She wonders if this could be a side effect of Lithium.  Lab Results  Component Value Date   TSH 3.07 10/14/2017   Tongue soreness. She has not noted oral lesions.  B12 deficiency: She is not on B12 supplementation. Lab Results  Component Value Date   VITAMINB12 219 10/08/2018   HLD: She is not taking Fenofibrate because cost, wonders if she needs to try something else.  Lab Results  Component Value Date   CHOL 193 11/28/2019   HDL 43.40 11/28/2019   LDLCALC 54 05/15/2017   LDLDIRECT 107.0 11/28/2019   TRIG 334.0 (H) 11/28/2019   CHOLHDL 4 11/28/2019   Elevated glucose: 221 and 208. + Polydipsia and polyuria. She has not noted dysuria, gross hematuria, or foamy urine. No history of diabetes.  Review of Systems  Constitutional: Positive for activity change and fatigue. Negative for appetite change.  HENT: Negative for mouth sores, nosebleeds and sore throat.   Eyes: Negative for redness and visual disturbance.  Cardiovascular: Negative for leg swelling.  Gastrointestinal: Negative for abdominal pain.  Negative for changes in bowel habits.  Allergic/Immunologic: Positive for environmental allergies.  Neurological: Negative for syncope, facial asymmetry and weakness.  Rest see pertinent positives and negatives per HPI.  Current Outpatient Medications on File Prior to Visit  Medication Sig Dispense Refill  . albuterol (VENTOLIN HFA) 108 (90 Base) MCG/ACT inhaler Inhale 2 puffs  into the lungs every 6 (six) hours as needed for wheezing or shortness of breath. 18 g 1  . azithromycin (ZITHROMAX) 250 MG tablet Take 1 tablet daily 2 each 0  . buPROPion (WELLBUTRIN XL) 150 MG 24 hr tablet Take 450 mg by mouth every morning.    . Cholecalciferol (VITAMIN D) 50 MCG (2000 UT) tablet Take 2,000 Units by mouth daily.    . fluticasone (FLONASE) 50 MCG/ACT nasal spray Place 2 sprays into both nostrils daily. 16 g 1  . Fluticasone-Salmeterol (ADVAIR DISKUS) 250-50 MCG/DOSE AEPB Inhale 1 puff into the lungs 2 (two) times daily. 60 each 3  . gabapentin (NEURONTIN) 300 MG capsule Take 300 mg by mouth at bedtime.    [name] Kitchen guaiFENesin (MUCINEX) 600 MG 12 hr tablet Take 1 tablet (600 mg total) by mouth 2 (two) times daily for 10 days. 20 tablet 0  . hydrOXYzine (ATARAX/VISTARIL) 25 MG tablet Take 1 tablet (25 mg total) by mouth 3 (three) times daily as needed for itching. 30 tablet 0  . lithium carbonate (LITHOBID) 300 MG CR tablet Take 2 tablets (600 mg total) by mouth every evening. (Patient taking differently: Take 300-600 mg by mouth See admin instructions. Take 300mg  in the morning and 600mg  in the evening.) 60 tablet 0  . LORazepam (ATIVAN) 0.5 MG tablet Take 0.5 mg by mouth 2 (two) times daily. Take an extra 0.5mg  as needed.    . meclizine (ANTIVERT) 25 MG tablet Take 1 tablet (25 mg total) by mouth 3 (three) times daily as needed for dizziness. 30 tablet 0  . omeprazole (PRILOSEC) 40 MG capsule Take 40 mg by mouth every evening.    . ondansetron (ZOFRAN) 4 MG tablet Take 1 tablet (4 mg total) by mouth daily as needed for nausea. 10 tablet 0   No current facility-administered medications on file prior to visit.   Past Medical History:  Diagnosis Date  . Anxiety   . Bipolar 1 disorder (HCC)    HX PSYCHOSIS  . Complication of anesthesia    POST AGGITATION  . Frequency of urination   . Headache(784.0)   . History of duodenal ulcer   . Hyperlipidemia   . IBS (irritable bowel  syndrome)   . Nocturia   . Pelvic pain   . Personality disorder ([place])    W/ BORDERLINE FEATURES  . Pneumonia 08/20/2020  . Urgency of urination    Allergies  Allergen Reactions  . Morphine And Related Nausea And Vomiting and Nausea Only  . Ambien [Zolpidem] Other (See Comments)    Per patient this caused her to fall  . Duloxetine Swelling and Other (See Comments)    Other reaction(s): SWELLING/EDEMA  . Latuda [Lurasidone Hcl]   . Morphine Nausea Only    Social History   Socioeconomic History  . Marital status: Married    Spouse name: Not on file  . Number of children: 0  . Years of education: college  . Highest education level: Not on file  Occupational History  . 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 Occupation: disability  Tobacco Use  . Smoking status: Former Smoker    Packs/day:  0.01    Years: 1.00    Pack years: 0.01    Types: Cigarettes    Quit date: 1980    Years since quitting: 42.0  . Smokeless tobacco: Never Used  . Tobacco comment: ONLY SMOKED FOR 6 MONTHS --  QUIT  YRS AGO  Vaping Use  . Vaping Use: Never used  Substance and Sexual Activity  . Alcohol use: Yes    Comment: occ  . Drug use: No  . Sexual activity: Not on file  Other Topics Concern  . 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    no tobacco   Caffeine Use: very little, three times a week   Social Determinants of Health   Financial Resource Strain: Not on file  Food Insecurity: Not on file  Transportation Needs: Not on file  Physical Activity: Not on file  Stress: Not on file  Social Connections: Not on file   Vitals:   08/29/20 1537  BP: 128/80  Pulse: 93  Resp: 16  SpO2: 96%   Body mass index is 29.1 kg/m.  Physical Exam Vitals and nursing note reviewed.  Constitutional:      General: She is not in acute  distress.    Appearance: She is well-developed and well-groomed.  HENT:     Head: Normocephalic and atraumatic.     Mouth/Throat:     Mouth: Oropharynx is clear and moist. Mucous membranes are dry.     Pharynx: Oropharynx is clear.  Eyes:     Conjunctiva/sclera: Conjunctivae normal.     Pupils: Pupils are equal, round, and reactive to light.  Cardiovascular:     Rate and Rhythm: Normal rate and regular rhythm.     Pulses:          Posterior tibial pulses are 2+ on the right side and 2+ on the left side.     Heart sounds: No murmur heard.   Pulmonary:     Effort: Pulmonary effort is normal. No respiratory distress.     Breath sounds: Normal breath sounds.  Abdominal:     Palpations: Abdomen is soft.     Tenderness: There is no abdominal tenderness.  Musculoskeletal:        General: No edema.  Lymphadenopathy:     Cervical: No cervical adenopathy.  Skin:    General: Skin is warm.     Findings: No erythema or rash.  Neurological:     General: No focal deficit present.     Mental Status: She is alert and oriented to person, place, and time.     Cranial Nerves: No cranial nerve deficit.     Deep Tendon Reflexes: Strength normal.     Comments: Unstable gait, wife is assisting her.  Psychiatric:        Mood and Affect: Mood and affect normal.   ASSESSMENT AND PLAN:  [name] was seen today for hospitalization follow-up.  Diagnoses and all orders for this visit: Orders Placed This Encounter  Procedures  . Basic metabolic panel  . TSH  . Vitamin B12  . Magnesium  . Ambulatory referral to Home Health  . Ambulatory referral to Neurology  . POC HgB A1c   Lab Results  Component Value Date   HGBA1C 5.0 08/29/2020   Lab Results  Component Value Date   VITAMINB12  240 08/29/2020   Lab Results  Component Value Date   TSH 1.42 08/29/2020   Lab Results  Component Value Date   CREATININE 1.03 08/29/2020   BUN 10 08/29/2020   NA 139 08/29/2020   K 2.9 (L) 08/29/2020    CL 107 08/29/2020   CO2 26 08/29/2020   Hyperglycemia HgA1C in normal range. Healthy like style for primary prevention.  Hypokalemia K+ supplementation according to K+ result.  Unsteady gait when walking It seems to be chronic. Lithium can deficient been a contributing factor, can cause coordination problems as well as muscle weakness.  Some of her other meds and chronic problems could also be aggravating factors. Neuro referral placed to continue following.  Fall in home, initial encounter Fall precautions discussed. PT through [facility] will be arranged.  B12 deficiency Can continue to some of ehr symptoms. Recommendations in regard to B12 supplementation according to B12 results.  Lithium use Lithium has helped with bipolar disorder. Recommend discussing her concerns with her psychiatrist.   Spent 50 minutes.  During this time history was obtained and documented, examination was performed, prior labs/imaging reviewed, and assessment/plan discussed.  Return in about 8 weeks (around 10/24/2020).  [name] Khader G. [name], MD  [facility]. [place] office.   A few things to remember from today's visit:   Hyperglycemia - Plan: POC HgB A1c  Hypokalemia - Plan: Basic metabolic panel, Magnesium  Unsteady gait when walking  Fall in home, initial encounter - Plan: Ambulatory referral to [place] deficiency - Plan: Vitamin B12  Lithium use - Plan: TSH  If you need refills please call your pharmacy. Do not use My Chart to request refills or for acute issues that need immediate attention.   Fall prevention. Home health will be arranged.  Neuro referral placed. Please discuss concerns about lithium with your psychiatrist.  Please be sure medication list is accurate. If a new problem present, please set up appointment sooner than planned today.

## 2020-08-30 LAB — BASIC METABOLIC PANEL
BUN: 10 mg/dL (ref 6–23)
CO2: 26 mEq/L (ref 19–32)
Calcium: 10.6 mg/dL — ABNORMAL HIGH (ref 8.4–10.5)
Chloride: 107 mEq/L (ref 96–112)
Creatinine, Ser: 1.03 mg/dL (ref 0.40–1.20)
GFR: 59.87 mL/min — ABNORMAL LOW (ref >60.00)
Glucose, Bld: 98 mg/dL (ref 70–99)
Potassium: 2.9 mEq/L — ABNORMAL LOW (ref 3.5–5.1)
Sodium: 139 mEq/L (ref 135–145)

## 2020-08-30 LAB — TSH: TSH: 1.42 u[IU]/mL (ref 0.35–4.50)

## 2020-08-30 LAB — VITAMIN B12: Vitamin B-12: 240 pg/mL (ref 211–911)

## 2020-08-30 LAB — MAGNESIUM: Magnesium: 2.1 mg/dL (ref 1.5–2.5)

## 2020-09-01 DIAGNOSIS — Z87891 Personal history of nicotine dependence: Secondary | ICD-10-CM | POA: Diagnosis not present

## 2020-09-01 DIAGNOSIS — F3110 Bipolar disorder, current episode manic without psychotic features, unspecified: Secondary | ICD-10-CM | POA: Diagnosis not present

## 2020-09-01 DIAGNOSIS — K580 Irritable bowel syndrome with diarrhea: Secondary | ICD-10-CM | POA: Diagnosis not present

## 2020-09-01 DIAGNOSIS — F603 Borderline personality disorder: Secondary | ICD-10-CM | POA: Diagnosis not present

## 2020-09-01 DIAGNOSIS — F419 Anxiety disorder, unspecified: Secondary | ICD-10-CM | POA: Diagnosis not present

## 2020-09-01 DIAGNOSIS — R2681 Unsteadiness on feet: Secondary | ICD-10-CM | POA: Diagnosis not present

## 2020-09-01 DIAGNOSIS — E876 Hypokalemia: Secondary | ICD-10-CM | POA: Diagnosis not present

## 2020-09-01 DIAGNOSIS — E538 Deficiency of other specified B group vitamins: Secondary | ICD-10-CM | POA: Diagnosis not present

## 2020-09-01 DIAGNOSIS — R739 Hyperglycemia, unspecified: Secondary | ICD-10-CM | POA: Diagnosis not present

## 2020-09-01 DIAGNOSIS — J189 Pneumonia, unspecified organism: Secondary | ICD-10-CM | POA: Diagnosis not present

## 2020-09-02 MED ORDER — POTASSIUM CHLORIDE CRYS ER 20 MEQ PO TBCR: 20.0000 meq | EXTENDED_RELEASE_TABLET | Freq: Every day | ORAL | 2 refills | Status: DC

## 2020-09-05 ENCOUNTER — Telehealth: Payer: Self-pay | Admitting: Family Medicine

## 2020-09-05 DIAGNOSIS — F3110 Bipolar disorder, current episode manic without psychotic features, unspecified: Secondary | ICD-10-CM | POA: Diagnosis not present

## 2020-09-05 DIAGNOSIS — E538 Deficiency of other specified B group vitamins: Secondary | ICD-10-CM | POA: Diagnosis not present

## 2020-09-05 DIAGNOSIS — F603 Borderline personality disorder: Secondary | ICD-10-CM | POA: Diagnosis not present

## 2020-09-05 DIAGNOSIS — J189 Pneumonia, unspecified organism: Secondary | ICD-10-CM | POA: Diagnosis not present

## 2020-09-05 DIAGNOSIS — F419 Anxiety disorder, unspecified: Secondary | ICD-10-CM | POA: Diagnosis not present

## 2020-09-05 DIAGNOSIS — E876 Hypokalemia: Secondary | ICD-10-CM | POA: Diagnosis not present

## 2020-09-05 NOTE — Telephone Encounter (Signed)
Radovan B an OT therapist from [facility] is calling and stated that he is scheduled to see the patient but he has cold symptoms and will continue with therapy next week. CB is [phone]

## 2020-09-05 NOTE — Telephone Encounter (Signed)
fyi

## 2020-09-07 DIAGNOSIS — F419 Anxiety disorder, unspecified: Secondary | ICD-10-CM | POA: Diagnosis not present

## 2020-09-07 DIAGNOSIS — J189 Pneumonia, unspecified organism: Secondary | ICD-10-CM | POA: Diagnosis not present

## 2020-09-07 DIAGNOSIS — F3110 Bipolar disorder, current episode manic without psychotic features, unspecified: Secondary | ICD-10-CM | POA: Diagnosis not present

## 2020-09-07 DIAGNOSIS — E538 Deficiency of other specified B group vitamins: Secondary | ICD-10-CM | POA: Diagnosis not present

## 2020-09-07 DIAGNOSIS — E876 Hypokalemia: Secondary | ICD-10-CM | POA: Diagnosis not present

## 2020-09-07 DIAGNOSIS — F603 Borderline personality disorder: Secondary | ICD-10-CM | POA: Diagnosis not present

## 2020-09-10 DIAGNOSIS — E538 Deficiency of other specified B group vitamins: Secondary | ICD-10-CM | POA: Diagnosis not present

## 2020-09-10 DIAGNOSIS — F3110 Bipolar disorder, current episode manic without psychotic features, unspecified: Secondary | ICD-10-CM | POA: Diagnosis not present

## 2020-09-10 DIAGNOSIS — J189 Pneumonia, unspecified organism: Secondary | ICD-10-CM | POA: Diagnosis not present

## 2020-09-10 DIAGNOSIS — F603 Borderline personality disorder: Secondary | ICD-10-CM | POA: Diagnosis not present

## 2020-09-10 DIAGNOSIS — F419 Anxiety disorder, unspecified: Secondary | ICD-10-CM | POA: Diagnosis not present

## 2020-09-10 DIAGNOSIS — E876 Hypokalemia: Secondary | ICD-10-CM | POA: Diagnosis not present

## 2020-09-11 ENCOUNTER — Telehealth: Payer: Self-pay | Admitting: Family Medicine

## 2020-09-11 DIAGNOSIS — J189 Pneumonia, unspecified organism: Secondary | ICD-10-CM | POA: Diagnosis not present

## 2020-09-11 DIAGNOSIS — F3110 Bipolar disorder, current episode manic without psychotic features, unspecified: Secondary | ICD-10-CM | POA: Diagnosis not present

## 2020-09-11 DIAGNOSIS — F603 Borderline personality disorder: Secondary | ICD-10-CM | POA: Diagnosis not present

## 2020-09-11 DIAGNOSIS — E876 Hypokalemia: Secondary | ICD-10-CM | POA: Diagnosis not present

## 2020-09-11 DIAGNOSIS — E538 Deficiency of other specified B group vitamins: Secondary | ICD-10-CM | POA: Diagnosis not present

## 2020-09-11 DIAGNOSIS — F419 Anxiety disorder, unspecified: Secondary | ICD-10-CM | POA: Diagnosis not present

## 2020-09-11 NOTE — Telephone Encounter (Signed)
Radovan OT from [facility] called to let Dr. [name] that he has completed [place] with the patient today. OT is no longer needed for this patient.  [employer] [phone]

## 2020-09-11 NOTE — Telephone Encounter (Signed)
fyi

## 2020-09-12 DIAGNOSIS — F3110 Bipolar disorder, current episode manic without psychotic features, unspecified: Secondary | ICD-10-CM | POA: Diagnosis not present

## 2020-09-12 DIAGNOSIS — F603 Borderline personality disorder: Secondary | ICD-10-CM | POA: Diagnosis not present

## 2020-09-12 DIAGNOSIS — E538 Deficiency of other specified B group vitamins: Secondary | ICD-10-CM | POA: Diagnosis not present

## 2020-09-12 DIAGNOSIS — F419 Anxiety disorder, unspecified: Secondary | ICD-10-CM | POA: Diagnosis not present

## 2020-09-12 DIAGNOSIS — J189 Pneumonia, unspecified organism: Secondary | ICD-10-CM | POA: Diagnosis not present

## 2020-09-12 DIAGNOSIS — E876 Hypokalemia: Secondary | ICD-10-CM | POA: Diagnosis not present

## 2020-09-17 DIAGNOSIS — E876 Hypokalemia: Secondary | ICD-10-CM | POA: Diagnosis not present

## 2020-09-17 DIAGNOSIS — E538 Deficiency of other specified B group vitamins: Secondary | ICD-10-CM | POA: Diagnosis not present

## 2020-09-17 DIAGNOSIS — J189 Pneumonia, unspecified organism: Secondary | ICD-10-CM | POA: Diagnosis not present

## 2020-09-17 DIAGNOSIS — F603 Borderline personality disorder: Secondary | ICD-10-CM | POA: Diagnosis not present

## 2020-09-17 DIAGNOSIS — F419 Anxiety disorder, unspecified: Secondary | ICD-10-CM | POA: Diagnosis not present

## 2020-09-17 DIAGNOSIS — F3110 Bipolar disorder, current episode manic without psychotic features, unspecified: Secondary | ICD-10-CM | POA: Diagnosis not present

## 2020-09-19 DIAGNOSIS — E876 Hypokalemia: Secondary | ICD-10-CM | POA: Diagnosis not present

## 2020-09-19 DIAGNOSIS — J189 Pneumonia, unspecified organism: Secondary | ICD-10-CM | POA: Diagnosis not present

## 2020-09-19 DIAGNOSIS — F419 Anxiety disorder, unspecified: Secondary | ICD-10-CM | POA: Diagnosis not present

## 2020-09-19 DIAGNOSIS — F3110 Bipolar disorder, current episode manic without psychotic features, unspecified: Secondary | ICD-10-CM | POA: Diagnosis not present

## 2020-09-19 DIAGNOSIS — E538 Deficiency of other specified B group vitamins: Secondary | ICD-10-CM | POA: Diagnosis not present

## 2020-09-19 DIAGNOSIS — F603 Borderline personality disorder: Secondary | ICD-10-CM | POA: Diagnosis not present

## 2020-09-24 DIAGNOSIS — F419 Anxiety disorder, unspecified: Secondary | ICD-10-CM | POA: Diagnosis not present

## 2020-09-24 DIAGNOSIS — J189 Pneumonia, unspecified organism: Secondary | ICD-10-CM | POA: Diagnosis not present

## 2020-09-24 DIAGNOSIS — E876 Hypokalemia: Secondary | ICD-10-CM | POA: Diagnosis not present

## 2020-09-24 DIAGNOSIS — F603 Borderline personality disorder: Secondary | ICD-10-CM | POA: Diagnosis not present

## 2020-09-24 DIAGNOSIS — F3110 Bipolar disorder, current episode manic without psychotic features, unspecified: Secondary | ICD-10-CM | POA: Diagnosis not present

## 2020-09-24 DIAGNOSIS — E538 Deficiency of other specified B group vitamins: Secondary | ICD-10-CM | POA: Diagnosis not present

## 2020-09-28 ENCOUNTER — Telehealth: Payer: Self-pay

## 2020-09-28 DIAGNOSIS — E538 Deficiency of other specified B group vitamins: Secondary | ICD-10-CM | POA: Diagnosis not present

## 2020-09-28 DIAGNOSIS — F603 Borderline personality disorder: Secondary | ICD-10-CM | POA: Diagnosis not present

## 2020-09-28 DIAGNOSIS — E876 Hypokalemia: Secondary | ICD-10-CM | POA: Diagnosis not present

## 2020-09-28 DIAGNOSIS — F3110 Bipolar disorder, current episode manic without psychotic features, unspecified: Secondary | ICD-10-CM | POA: Diagnosis not present

## 2020-09-28 DIAGNOSIS — F419 Anxiety disorder, unspecified: Secondary | ICD-10-CM | POA: Diagnosis not present

## 2020-09-28 DIAGNOSIS — J189 Pneumonia, unspecified organism: Secondary | ICD-10-CM | POA: Diagnosis not present

## 2020-09-28 NOTE — Telephone Encounter (Signed)
Received a phone call from [place] at [facility] to extend PT. Verbal given.

## 2020-10-01 DIAGNOSIS — Z87891 Personal history of nicotine dependence: Secondary | ICD-10-CM | POA: Diagnosis not present

## 2020-10-01 DIAGNOSIS — F603 Borderline personality disorder: Secondary | ICD-10-CM | POA: Diagnosis not present

## 2020-10-01 DIAGNOSIS — F3110 Bipolar disorder, current episode manic without psychotic features, unspecified: Secondary | ICD-10-CM | POA: Diagnosis not present

## 2020-10-01 DIAGNOSIS — E876 Hypokalemia: Secondary | ICD-10-CM | POA: Diagnosis not present

## 2020-10-01 DIAGNOSIS — F419 Anxiety disorder, unspecified: Secondary | ICD-10-CM | POA: Diagnosis not present

## 2020-10-01 DIAGNOSIS — R739 Hyperglycemia, unspecified: Secondary | ICD-10-CM | POA: Diagnosis not present

## 2020-10-01 DIAGNOSIS — R2681 Unsteadiness on feet: Secondary | ICD-10-CM | POA: Diagnosis not present

## 2020-10-01 DIAGNOSIS — K580 Irritable bowel syndrome with diarrhea: Secondary | ICD-10-CM | POA: Diagnosis not present

## 2020-10-01 DIAGNOSIS — J189 Pneumonia, unspecified organism: Secondary | ICD-10-CM | POA: Diagnosis not present

## 2020-10-01 DIAGNOSIS — E538 Deficiency of other specified B group vitamins: Secondary | ICD-10-CM | POA: Diagnosis not present

## 2020-10-05 DIAGNOSIS — J189 Pneumonia, unspecified organism: Secondary | ICD-10-CM | POA: Diagnosis not present

## 2020-10-05 DIAGNOSIS — F419 Anxiety disorder, unspecified: Secondary | ICD-10-CM | POA: Diagnosis not present

## 2020-10-05 DIAGNOSIS — E538 Deficiency of other specified B group vitamins: Secondary | ICD-10-CM | POA: Diagnosis not present

## 2020-10-05 DIAGNOSIS — F3110 Bipolar disorder, current episode manic without psychotic features, unspecified: Secondary | ICD-10-CM | POA: Diagnosis not present

## 2020-10-05 DIAGNOSIS — F603 Borderline personality disorder: Secondary | ICD-10-CM | POA: Diagnosis not present

## 2020-10-05 DIAGNOSIS — E876 Hypokalemia: Secondary | ICD-10-CM | POA: Diagnosis not present

## 2020-10-10 DIAGNOSIS — F3110 Bipolar disorder, current episode manic without psychotic features, unspecified: Secondary | ICD-10-CM | POA: Diagnosis not present

## 2020-10-10 DIAGNOSIS — J189 Pneumonia, unspecified organism: Secondary | ICD-10-CM | POA: Diagnosis not present

## 2020-10-10 DIAGNOSIS — E538 Deficiency of other specified B group vitamins: Secondary | ICD-10-CM | POA: Diagnosis not present

## 2020-10-10 DIAGNOSIS — F603 Borderline personality disorder: Secondary | ICD-10-CM | POA: Diagnosis not present

## 2020-10-10 DIAGNOSIS — F419 Anxiety disorder, unspecified: Secondary | ICD-10-CM | POA: Diagnosis not present

## 2020-10-10 DIAGNOSIS — E876 Hypokalemia: Secondary | ICD-10-CM | POA: Diagnosis not present

## 2020-10-12 DIAGNOSIS — F603 Borderline personality disorder: Secondary | ICD-10-CM | POA: Diagnosis not present

## 2020-10-12 DIAGNOSIS — J189 Pneumonia, unspecified organism: Secondary | ICD-10-CM | POA: Diagnosis not present

## 2020-10-12 DIAGNOSIS — F3110 Bipolar disorder, current episode manic without psychotic features, unspecified: Secondary | ICD-10-CM | POA: Diagnosis not present

## 2020-10-12 DIAGNOSIS — F419 Anxiety disorder, unspecified: Secondary | ICD-10-CM | POA: Diagnosis not present

## 2020-10-12 DIAGNOSIS — E538 Deficiency of other specified B group vitamins: Secondary | ICD-10-CM | POA: Diagnosis not present

## 2020-10-12 DIAGNOSIS — E876 Hypokalemia: Secondary | ICD-10-CM | POA: Diagnosis not present

## 2020-10-15 ENCOUNTER — Ambulatory Visit (INDEPENDENT_AMBULATORY_CARE_PROVIDER_SITE_OTHER): Payer: Medicare Other | Admitting: Neurology

## 2020-10-15 ENCOUNTER — Other Ambulatory Visit: Payer: Self-pay

## 2020-10-15 ENCOUNTER — Encounter: Payer: Self-pay | Admitting: Neurology

## 2020-10-15 VITALS — BP 132/84 | HR 93 | Ht 60.0 in | Wt 154.4 lb

## 2020-10-15 DIAGNOSIS — R413 Other amnesia: Secondary | ICD-10-CM | POA: Diagnosis not present

## 2020-10-15 DIAGNOSIS — R296 Repeated falls: Secondary | ICD-10-CM

## 2020-10-15 NOTE — Patient Instructions (Signed)
MRI brain wwo constrast

## 2020-10-15 NOTE — Progress Notes (Signed)
Follow-up Visit   Date: 10/15/20   [name] MRN: [id number] DOB: June 13, 1961   Interim History: [name] is a 60 y.o. Caucasian female with bipolar depression, anxiety, GERD, hyperlipidemia, and IBS returning to the clinic for follow-up of falls.  The patient was accompanied to the clinic by partner who also provides collateral information.    History of present illness: Starting around 2015, she began having weakness of the upper legs which has been intermittent and became worse over the past year. Specifically, she has difficulty climbing stairs and walking for extended periods.  Sometimes, her partner states that she will walk even faster because she feels unsteady.   She does not use a cane.   She has suffered 10 falls over the past year, she usually needs help to stand up.  She has not had any ER visit due to injuries. She does not have numbness/tingling of the legs, low back pain, or pain in the legs.  She completed physical therapy about 6-9 months ago, but she does not appreciate much improvement.  Upon further discussion, her partner states that she had a psychotic episode about 5 years ago and everything has been worse since then. She feels scared and afraid to walk. She sees psychiatry and a Social worker.  She is on disability for bipolar disease since this time.    In the past, she has been evaluated neurologists at [facility] and [facility] for various neurological symptoms, including weakness.  Imaging excluded possibility for multiple sclerosis and NCS/EMG with repetitive nerve stimulation excluded neuromuscular junction disorder.     UPDATE 10/15/2020:  Over the past 3 months, she has been falling more frequently.  She says that she looses her balance and falls without always being able to break her falls. She is not sure what causes her to fall.  She cannot tell whether her legs buckle, she is in pain, gets lightheaded, or trips.  She also complains of memory loss and  being forgetful frequently.  She manages her own medication, but admits to sometimes taking it twice. She does not use a pillbox, despite this being encouraged by her partner.   Medications:  Current Outpatient Medications on File Prior to Visit  Medication Sig Dispense Refill  . albuterol (VENTOLIN HFA) 108 (90 Base) MCG/ACT inhaler Inhale 2 puffs into the lungs every 6 (six) hours as needed for wheezing or shortness of breath. 18 g 1  . azithromycin (ZITHROMAX) 250 MG tablet Take 1 tablet daily 2 each 0  . buPROPion (WELLBUTRIN XL) 150 MG 24 hr tablet Take 450 mg by mouth every morning.    . Cholecalciferol (VITAMIN D) 50 MCG (2000 UT) tablet Take 2,000 Units by mouth daily.    . fluticasone (FLONASE) 50 MCG/ACT nasal spray Place 2 sprays into both nostrils daily. 16 g 1  . Fluticasone-Salmeterol (ADVAIR DISKUS) 250-50 MCG/DOSE AEPB Inhale 1 puff into the lungs 2 (two) times daily. 60 each 3  . gabapentin (NEURONTIN) 300 MG capsule Take 300 mg by mouth at bedtime.    . hydrOXYzine (ATARAX/VISTARIL) 25 MG tablet Take 1 tablet (25 mg total) by mouth 3 (three) times daily as needed for itching. 30 tablet 0  . lithium carbonate (LITHOBID) 300 MG CR tablet Take 2 tablets (600 mg total) by mouth every evening. (Patient taking differently: Take 300-600 mg by mouth See admin instructions. Take 300mg  in the morning and 600mg  in the evening.) 60 tablet 0  . LORazepam (ATIVAN) 0.5 MG  tablet Take 0.5 mg by mouth 2 (two) times daily. Take an extra 0.5mg  as needed.    . meclizine (ANTIVERT) 25 MG tablet Take 1 tablet (25 mg total) by mouth 3 (three) times daily as needed for dizziness. 30 tablet 0  . omeprazole (PRILOSEC) 40 MG capsule Take 40 mg by mouth every evening.    . ondansetron (ZOFRAN) 4 MG tablet Take 1 tablet (4 mg total) by mouth daily as needed for nausea. 10 tablet 0  . potassium chloride SA (KLOR-CON) 20 MEQ tablet Take 1 tablet (20 mEq total) by mouth daily. 30 tablet 2   No current  facility-administered medications on file prior to visit.    Allergies:  Allergies  Allergen Reactions  . Morphine And Related Nausea And Vomiting and Nausea Only  . Ambien [Zolpidem] Other (See Comments)    Per patient this caused her to fall  . Duloxetine Swelling and Other (See Comments)    Other reaction(s): SWELLING/EDEMA  . Latuda [Lurasidone Hcl]   . Morphine Nausea Only    Vital Signs:  BP 132/84   Pulse 93   Ht 5' (1.524 m)   Wt 154 lb 6.4 oz (70 kg)   SpO2 99%   BMI 30.15 kg/m   Neurological Exam: MENTAL STATUS including orientation to time, place, person, recent and remote memory, attention span and concentration, language, and fund of knowledge is normal.  Speech is not dysarthric.    MOTOR:  Motor strength is 5/5 in all extremities.  No atrophy, fasciculations or abnormal movements.  No pronator drift.  Tone is normal.    MSRs:  Reflexes are 2+/4 throughout.  SENSORY:  Intact to vibration throughout.  Rhomberg testing is positive.   COORDINATION/GAIT:  Normal finger-to- nose-finger.  Intact rapid alternating movements bilaterally.  Gait narrow based and stable. Stressed gait intact.  Unsteady with tandem gait.  Data: MRI brain 09/23/2014: Abnormal MRI scan of the brain showing tiny bilateral subcortical and periventricular nonspecific white matter hyperintensities with the differential discussed above. No enhancing lesions are noted.  MRI cervical and thoracic spine wwo contrast 06/11/2011:  Negative cervical and thoracic spine MRI. No evidence of multiple sclerosis. Central canal and foramina widely patent all levels.  NCS/EMG of the right arm and leg 09/21/2013:  Nerve conduction studies done on both upper extremities and on the right lower extremity were normal. No evidence of a peripheral neuropathy is seen. EMG evaluation was not done. Repetitive nerve stimulation on the right hand was done at 3 Hz stimulation, and again at 40 Hz stimulation without evidence  of a neuromuscular transmission disorder. Repetitive nerve stimulation studies were completely normal.  IMPRESSION/PLAN: 1. Falls, unclear etiology.  No evidence of weakness, neurodegenerative condition, or neuropathy on her exam.  2. Memory loss.  Unable to perform cognitive screening exam due to time constraints and patient arriving late to visit.  I suspect that her mood disorder is playing a role into her cognitive issues.   PLAN/RECOMMENDATIONS:  MRI brain wwo contrast Formal neurocognitive testing Continue physical therapy  Further recommendations pending results.  Thank you for allowing me to participate in patient's care.  If I can answer any additional questions, I would be pleased to do so.    Sincerely,    Mahli Glahn K. Posey Pronto, DO

## 2020-10-16 ENCOUNTER — Encounter: Payer: Self-pay | Admitting: Psychology

## 2020-10-16 ENCOUNTER — Ambulatory Visit: Payer: Medicare Other | Admitting: Psychology

## 2020-10-16 ENCOUNTER — Ambulatory Visit (INDEPENDENT_AMBULATORY_CARE_PROVIDER_SITE_OTHER): Payer: Medicare Other | Admitting: Psychology

## 2020-10-16 DIAGNOSIS — R4189 Other symptoms and signs involving cognitive functions and awareness: Secondary | ICD-10-CM

## 2020-10-16 DIAGNOSIS — F4010 Social phobia, unspecified: Secondary | ICD-10-CM | POA: Insufficient documentation

## 2020-10-16 DIAGNOSIS — F319 Bipolar disorder, unspecified: Secondary | ICD-10-CM

## 2020-10-16 DIAGNOSIS — G3184 Mild cognitive impairment, so stated: Secondary | ICD-10-CM | POA: Insufficient documentation

## 2020-10-16 DIAGNOSIS — T56891S Toxic effect of other metals, accidental (unintentional), sequela: Secondary | ICD-10-CM | POA: Diagnosis not present

## 2020-10-16 HISTORY — DX: Mild cognitive impairment of uncertain or unknown etiology: G31.84

## 2020-10-16 NOTE — Progress Notes (Signed)
NEUROPSYCHOLOGICAL EVALUATION [place]. [place] [facility] of Neurology  Date of Evaluation: October 16, 2020  Reason for Referral:   [name] is a 61 y.o. left-handed Caucasian female referred by Narda Amber, D.O., to characterize her current cognitive functioning and assist with diagnostic clarity and treatment planning in the context of subjective cognitive decline, numerous psychiatric and medical comorbidities, and potential for polypharmacy.  Assessment and Plan:   Clinical Impression(s): During testing, [name] affect was noted to be flat and she appeared quite fatigued. She frequently asked "how much longer" would the evaluation persist and commented at the end that she was "spent." Sustained attention was variable and there times where effort/engagement appeared to wax and wane. Task persistence was also variable with sporadic concerns surrounding adequate effort being present. Scores across stand-alone and embedded performance validity measures were variable with several scores scoring below expectation. I do not have reason to believe that [name] was intentionally performing poorly across some aspects of testing. However, the results of the current evaluation should be interpreted with caution and are likely to underestimate her true abilities to an unknown degree.  If taken at face value, [name]'s pattern of performance is suggestive of prominent dysfunction surrounding executive functioning, verbal fluency visuospatial abilities, and both encoding (i.e., learning) and retrieval aspects of memory. Performance variability was exhibited across processing speed and consolidation aspects of memory. Performance was appropriate across attention/concentration, receptive language, and confrontation naming. [name] generally denied difficulties completing instrumental activities of daily living (ADLs) independently. As such, if cognitive  performances are taken at face value, she would best be characterized as being in the Mild Cognitive Impairment realm at the present time.   The etiology for ongoing dysfunction, regardless of if results are taken at face value or not, is likely multifactorial in nature. Acutely, she described moderate symptoms of anxiety and mild symptoms of depression. She also reported a history of bipolar disorder dating back to when she was 61, as well as a history of social anxiety. Chronic and ongoing psychiatric distress can certainly impact day-to-day functioning and would create weaknesses across cognitive testing. This would be especially true across processing speed, executive functioning, verbal fluency, and encoding/retrieval aspects of memory. Additionally, individuals with a history of bipolar disorder can have accompanying deficits in executive functioning and visuospatial abilities as a part of their clinical presentation. I do not see compelling evidence for schizoaffective disorder and believe that associated symptoms are well encompassed by her longstanding history of bipolar disorder.   In addition, she reported moderate levels of sleep dysfunction, as well as fatigue and concerns surrounding polypharmacy. Regarding the latter, she reported utilizing Ativan as needed for anxiety symptoms and to assist with sleep initiation. Ativan has well documented cognitive side effects which could worsen brain fog and cognitive dysfunction. Furthermore, meta-analyses have suggested that taking lithium in general can result in psychomotor and cognitive slowing. However, her history of lithium toxicity in 2015 would offer far greater risk for the development and maintenance of cognitive dysfunction. Overall, it remains most likely that her current presentation is most attributed to her history of psychiatric distress, combined with sleep dysfunction, polypharmacy, and history of lithium toxicity. I cannot rule out an  underlying neurodegenerative condition; however, [name] would be quite young for this sort of presentation and per Dr. Serita Grit notes, previous assessments have not revealed any likely neurological culprit for experienced symptoms at this time. Continued medical monitoring will be important moving  forward.   Recommendations: It appears that a brain MRI had been ordered by Dr. Posey Pronto but not yet scheduled. I would encourage [name] to schedule this procedure as this would allow for updated neuroimaging. Per records, her last brain MRI was performed back in 2016.   Following improvement in psychiatric symptoms, polypharmacy, and sleep concerns, a repeat neuropsychological evaluation could be considered to assess for residual deficits if desired.  A combination of medication and psychotherapy has been shown to be most effective at treating symptoms of anxiety and depression. As such, [name] is encouraged to speak with her prescribing physician regarding medication adjustments to optimally manage these symptoms.   Likewise, [name] is encouraged to consider engaging in short-term psychotherapy to address symptoms of psychiatric distress. She would benefit from an active and collaborative therapeutic environment, rather than one purely supportive in nature. Recommended treatment modalities include Cognitive Behavioral Therapy (CBT) or Acceptance and Commitment Therapy (ACT).  [name] is encouraged to attend to lifestyle factors for brain health (e.g., regular physical exercise, good nutrition habits, regular participation in cognitively-stimulating activities, and general stress management techniques), which are likely to have benefits for both emotional adjustment and cognition. In fact, in addition to promoting good general health, regular exercise incorporating aerobic activities (e.g., brisk walking, jogging, cycling, etc.) has been demonstrated to be a very effective  treatment for depression and stress, with similar efficacy rates to both antidepressant medication and psychotherapy.  When learning new information, she would benefit from information being broken up into small, manageable pieces. She may also find it helpful to articulate the material in her own words and in a context to promote encoding at the onset of a new task. This material may need to be repeated multiple times to promote encoding.  Memory can be improved using internal strategies such as rehearsal, repetition, chunking, mnemonics, association, and imagery. External strategies such as written notes in a consistently used memory journal, visual and nonverbal auditory cues such as a calendar on the refrigerator or appointments with alarm, such as on a cell phone, can also help maximize recall.    To address problems with processing speed, she may wish to consider:   -Ensuring that she is alerted when essential material or instructions are being presented   -Adjusting the speed at which new information is presented   -Allowing for more time in comprehending, processing, and responding in conversation  To address problems with fluctuating attention, she may wish to consider:   -Avoiding external distractions when needing to concentrate   -Limiting exposure to fast paced environments with multiple sensory demands    -Writing down complicated information and using checklists   -Attempting and completing one task at a time (i.e., no multi-tasking)   -Verbalizing aloud each step of a task to maintain focus   -Reducing the amount of information considered at one time  Review of Records:   [name] was seen in the ED on 04/12/2014 after being found by EMS on the side of the road. She was said to initially not answer questions or follow commands. She reportedly had been working with her psychiatrist to reduce her lithium due to symptoms of brain fog. Initially, she described her mind racing,  feelings that the television was making reference to her, and that there is an outside force which controls her. She denied hallucinations but reported delusions surrounding the ability to sense things and having various premonitions and intuitions. She further reported the ability to read  people's minds. She was admitted to the adult psychiatric unit and responded well to medications. She was ultimately diagnosed with schizoaffective disorder, bipolar subtype. However, there is also reference to a longstanding history of bipolar disorder which predated this hospitalization. A discharge summary was written on 04/22/2014, stating that she was discharged home. However, she was seen in the ED the next day stating that she had been urinating on herself, was unable to void, and was convinced that she has a urethral mass. Reports suggest she was again discharged home on 04/24/2014. She was again seen in the ED on 04/30/2014 for urinary frequency and urgency. Her UA was unremarkable with no signs of urinary retention and she was discharged to [name] on 05/02/2014. She responded well and was discharged on 05/16/2014. 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 Her mother reported that Ms. Hogenson had been up all night pacing the floor and would not sleep. I was unable to locate a discharge summary regarding this final visit. Her next ED visit was a year later on 06/18/2015 due to increasing symptoms of depression, thoughts surrounding overdosing on Ambien due to various financial stressors, and fears of becoming homeless. She was again admitted, responded well to treatment, and was eventually discharged home on 06/27/2015.    She was seen by [facility] Narda Amber, D.O.) on 09/05/2019 with new complaints of jaw tightness. Starting around summer 2020, she began  having a sensation that her left jaw gets stuck. This occurs when she is chewing. She denied jaw pain, clicking, ear pain, or facial numbness/tingling. She also reported having spells of slurred speech which are worse when she is fatigued. She has a history of chronic fatigue and has been extensively evaluated by neurology; all assessments have been negative for a primary neurological explanation of her symptoms.   [name] was seen by [facility] Carolann Littler, M.D.) on 08/06/2020 for sore throat symptoms. She reported 3 to 4 weeks sore throat symptoms, predominantly left-sided. She had recently been prescribed amoxicillin empirically to cover for strep but that did not make any difference. She had tried multiple over-the-counter things including Tylenol, ibuprofen, Chloraseptic spray; all provided minimal relief. She was not aware of any adenopathy. Fever, chills, and cough were denied. She reported frequently biting her tongue and thinks she may have some malalignment issues with her TMJ joint.  She was briefly seen in the ED on 08/17/2020 for worsening cough, shortness of breath, and malaise. In the ED, she was found febrile satting in the 90s. Head and C-spine CTs were negative for acute findings. Abdomen/pelvis CT was said to catch the lower right lower lobe consolidation. She was ultimately diagnosed with community acquired pneumonia. Testing for COVID-19 was negative. She followed up with family medicine (Betty [name], M.D.) on 08/29/2020 for ED-related difficulties.  Most recently, she was again seen by Dr. Posey Pronto on 10/15/2020. Records were unavailable to review at the time of the current evaluation. Ultimately, Ms. Suen was referred for a comprehensive neuropsychological evaluation to characterize her cognitive abilities and to assist with diagnostic clarity and treatment planning.   NCS/EMG on 09/21/2013 of the right arm and leg were normal. No evidence of a peripheral neuropathy  was seen. Repetitive nerve stimulation on the right hand was done at 3 Hz stimulation, and again at 40 Hz stimulation without evidence of a  neuromuscular transmission disorder. Repetitive nerve stimulation studies were completely normal. Brain MRI on 09/23/2014 revealed tiny bilateral subcortical and periventricular nonspecific white matter hyperintensities. No enhancing lesions were noted. Head CT on 10/22/2017 revealed mild atrophy for age.  Past Medical History:  Diagnosis Date  . Allergic rhinitis 03/26/2007  . Autonomic dysfunction 01/11/2015  . Bipolar 1 disorder    Hx of psychosis; present since age 62  . Chronic fatigue 06/21/2017  . Community acquired pneumonia of right lung 08/17/2020  . Complication of anesthesia    agitation  . Costochondritis 02/14/2014  . Diverticular disease of colon 08/06/2020  . Double vision 03/15/2011  . Dyspnea on effort 01/10/2011  . Dysuria 02/15/2013  . Elevated lactic acid level   . Endometriosis 05/11/2008  . Fatty liver disease, nonalcoholic 50/04/3817  . Frequency of urination   . Gastroesophageal reflux disease 08/06/2020  . Headache   . History of duodenal ulcer   . History of lithium toxicity 05/01/2014  . Hyperglycemia 01/10/2011  . Hyperlipidemia, mixed 05/12/2017  . Hyperprolactinemia 06/26/2015  . Insomnia 05/12/2017  . Irritable bowel syndrome 08/06/2020  . Jaw pain 02/13/2012   poss tmj area    . Leukocytosis 05/01/2014  . Multinodular goiter 05/12/2017  . Nocturia   . Nondiabetic gastroparesis 01/11/2015  . Pelvic pain   . Plantar fasciitis 06/11/2007  . Pneumonia 08/20/2020  . Pruritus ani 08/06/2020  . Renal stone 02/13/2014  . Schizoaffective disorder, bipolar type    Unclear if this is not better accounted for by longstanding history of bipolar disorder  . Social anxiety disorder   . Subacute bronchitis 07/10/2018  . Thyroid nodule 02/02/2017  . Urgency of urination   . Vitamin D deficiency 09/23/2011    Past  Surgical History:  Procedure Laterality Date  . CARDIAC CATHETERIZATION  10-09-1999  DR KATZ   NORMAL LVF/  NORMAL RCA/  NO CRITICAL DISEASE LEFT CORONARY SYSTEM  . CARDIOVASCULAR STRESS TEST  01-23-2011   NORMAL NUCLEAR STUDY/ NO ISCHEMIA/ EF 81%  . CYSTOSCOPY WITH BIOPSY N/A 06/03/2013   Procedure: CYSTOSCOPY WITH BIOPSY  INSTILLATION OF MARCAINE AND PYRIDIUM;  Surgeon: Hanley Ben, MD;  Location: [facility];  Service: Urology;  Laterality: N/A;  . LAPAROSCOPIC CHOLECYSTECTOMY  11-08-2001  . LAPAROSCOPIC LEFT SALPINGOOPHORECTOMY AND LYSYS ADHESIONS  03-10-2002  . LAPAROSCOPIC REMOVAL OVARY REMNANT  2003   [place]  . LAPAROSCOPY N/A 12/14/2012   Procedure: LAPAROSCOPY DIAGNOSTIC;  Surgeon: Stark Klein, MD;  Location: [facility];  Service: General;  Laterality: N/A;  . TOTAL ABDOMINAL HYSTERECTOMY  2000   W/ RIGHT SALPINGOOPHORECTOMY  . TRANSTHORACIC ECHOCARDIOGRAM  10-13-2010   NORMAL LVF/  EF 55-60%    Current Outpatient Medications:  .  albuterol (VENTOLIN HFA) 108 (90 Base) MCG/ACT inhaler, Inhale 2 puffs into the lungs every 6 (six) hours as needed for wheezing or shortness of breath., Disp: 18 g, Rfl: 1 .  buPROPion (WELLBUTRIN XL) 150 MG 24 hr tablet, Take 450 mg by mouth every morning., Disp: , Rfl:  .  Cholecalciferol (VITAMIN D) 50 MCG (2000 UT) tablet, Take 2,000 Units by mouth daily. (Patient not taking: Reported on 10/15/2020), Disp: , Rfl:  .  FENOFIBRATE PO, Take by mouth., Disp: , Rfl:  .  fluticasone (FLONASE) 50 MCG/ACT nasal spray, Place 2 sprays into both nostrils daily., Disp: 16 g, Rfl: 1 .  Fluticasone-Salmeterol (ADVAIR DISKUS) 250-50 MCG/DOSE AEPB, Inhale 1 puff into the lungs 2 (two) times daily., Disp: 60 each, Rfl: 3 .  gabapentin (NEURONTIN) 300 MG capsule, Take 300 mg by mouth at bedtime., Disp: , Rfl:  .  hydrOXYzine (ATARAX/VISTARIL) 25 MG tablet, Take 1 tablet (25 mg total) by mouth 3 (three) times daily as needed for itching., Disp: 30  tablet, Rfl: 0 .  lithium carbonate (LITHOBID) 300 MG CR tablet, Take 2 tablets (600 mg total) by mouth every evening. (Patient taking differently: Take 300-600 mg by mouth See admin instructions. Take 332m in the morning and 6024min the evening.), Disp: 60 tablet, Rfl: 0 .  LORazepam (ATIVAN) 0.5 MG tablet, Take 0.5 mg by mouth 2 (two) times daily. Take an extra 0.15m72ms needed., Disp: , Rfl:  .  meclizine (ANTIVERT) 25 MG tablet, Take 1 tablet (25 mg total) by mouth 3 (three) times daily as needed for dizziness., Disp: 30 tablet, Rfl: 0 .  omeprazole (PRILOSEC) 40 MG capsule, Take 40 mg by mouth every evening., Disp: , Rfl:  .  ondansetron (ZOFRAN) 4 MG tablet, Take 1 tablet (4 mg total) by mouth daily as needed for nausea., Disp: 10 tablet, Rfl: 0 .  potassium chloride SA (KLOR-CON) 20 MEQ tablet, Take 1 tablet (20 mEq total) by mouth daily., Disp: 30 tablet, Rfl: 2  Clinical Interview:   The following information was obtained during a clinical interview with [name] and her wife ChrAdonis Brookior to cognitive testing.  Cognitive Symptoms: Decreased short-term memory: Endorsed. She reported largely generalized short-term memory dysfunction with a particular area of difficulty surrounding trouble recalling words or the names of individuals. When asked, she reported trouble recalling the details of previous conversations and misplacing things around her residence. Regarding the former, ChrAdonis Brookded that [name] will often forget conversations minutes after they have been completed. Decreased long-term memory: Denied. Decreased attention/concentration: Endorsed. Deficits were largely attributed to issues with sustained attention. She denied trouble with increased distractibility.  Reduced processing speed: Endorsed. She described her processing as "very slow" and wondered if medication side effects could be playing a role.  Difficulties with executive functions: Endorsed. She reported  trouble with organization and complex planning. ChrAdonis Brookded that she has trouble remembering the steps when she attempts to make or complete multi-step processes. Trouble with impulsivity was denied, as well as the presence of overt personality changes.  Difficulties with emotion regulation: Denied. Difficulties with receptive language: Denied. Difficulties with word finding: Endorsed. Decreased visuoperceptual ability: Denied.  Trajectory of deficits: Per ChrAdonis Brook mutual friend of theirs who has known Ms. Hughett longer stated that cognitive changes were said to first be noticeable following her psychiatric hospitalization in late 2015. There were concerns for lithium toxicity surrounding this visit, as well as bipolar versus schizoaffective disorder. Since that time, cognitive dysfunction has remained largely present and was said to have gradually worsened.   Difficulties completing ADLs: Largely denied. ChrAdonis Brookported that [name] will make comments surrounding the inability to recall if she has previously taken a medication. This was said to be worse during the past six months. [name] not drive due to concerns held by [facility] how [name]'s various medical, psychiatric, and cognitive concerns would impact driving. ChrAdonis Brookd eventually state that Ms. Belmares would be able to drive independently if absolutely necessary.   Additional Medical History: History of traumatic brain injury/concussion: Endorsed. She was seen in the ED on 06/15/2020 stating that, five days previously, she fell and hit her forehead while walking her dog. She reported persistent moderate to severe frontal headaches since then, along  with dizziness and occasional vomiting. There was also report of a second fall two days prior to being seen in the ED. Head CT on 06/15/2020 was negative.  History of stroke: Denied. History of seizure activity: Denied. History of known exposure to  toxins: Denied. Symptoms of chronic pain: Denied. Experience of frequent headaches/migraines: Endorsed. Recently, she reported experiencing headache symptoms a couple days per week. These were generally said to be diffuse, squeezing sensations and are accompanied by feeling physically weak, dizziness, and nausea. They are generally treated with Excedrin migraine.  Frequent instances of dizziness/vertigo: Endorsed. She reported ongoing dizziness which was said to have worsened over the past few years. Symptoms were said to impact balance instability. She wondered if this could be related to medication side effects.   Sensory changes: She reported being able to see reasonably well but does have a floater and occasional blurred vision. Other sensory changes/difficulties (e.g., hearing, taste, smell) were denied.  Balance/coordination difficulties: Endorsed. She described her balance as "awful," stating that it has been gradually worsening over the past three years. She reported feeling weakness and instability in both her legs with one side not being worse than the other. She reported a history of several falls, generally falling forward. Adonis Brook added that she feels that Ms. Arseneault's center of gravity is off. Outpatient PT sessions were said to be helpful.  Other motor difficulties: Endorsed. She reported experiencing tremors in her upper extremities bilaterally, especially when using her hands to perform various tasks. She was unsure if anxiety/stress created or worsened these experiences.  Sleep History: Estimated hours obtained each night: Unclear. She described her sleep as "sporadic," making a numerical estimation difficult to produce.  Difficulties falling asleep: Endorsed. Symptoms of insomnia were largely attributed to significant anxiety, including racing and intrusive thoughts. She reported taking Ativan as needed, sometimes in order to help her fall asleep.  Difficulties staying asleep:  Endorsed. She reported waking throughout the night either to use the restroom or for other unknown reasons.  Feels rested and refreshed upon awakening: She reported sometimes waking feeling tired and lethargic.   History of snoring: Denied. History of waking up gasping for air: Denied. Witnessed breath cessation while asleep: Denied.  History of vivid dreaming: Denied. Excessive movement while asleep: Denied. Instances of acting out her dreams: Denied.  Psychiatric/Behavioral Health History: Depression: Endorsed. She reported being first diagnosed with bipolar disorder I when she was 60 years old. Over the years, she has had several depressive and manic episodes, as well as numerous psychiatric hospitalizations. Her most recent bout of hospitalizations in late 2015 is described above. Despite this, she described her current mood as positive. She was unsure if her current medication regime was helpful, stating that symptom control could likely be improved in her opinion. Current suicidal ideation, intent, or plan was denied.  Anxiety: Endorsed. Adonis Brook noted that anxiety symptoms are generally social in nature. These are longstanding and have persisted to present day.  Mania: Endorsed (see above).  Trauma History: Denied. Visual/auditory hallucinations: Denied outside of manic/depressive episodes relating back to her history of bipolar disorder. Delusional thoughts: Denied outside of manic/depressive episodes relating back to her history of bipolar disorder.  Tobacco: Denied. Alcohol: She reported occasional/infrequent alcohol consumption and denied a history of problematic alcohol abuse or dependence.  Recreational drugs: Denied. Caffeine: Denied outside of an occasional soda.   Family History: Problem Relation Age of Onset  . Sleep apnea Father   . Depression Father   . Alcohol abuse  Father   . Hypertension Father   . Migraines Sister        headaches  . Anxiety disorder Sister   .  Other Mother        MAC infection  . Anxiety disorder Mother   . Dementia Mother        likely Alzheimer's disease; symptom onset on late 70s/early 13s  . Heart disease Paternal Grandmother   . Hyperlipidemia Paternal Grandmother   . Alcohol abuse Paternal Grandfather   . Diabetes Neg Hx    This information was confirmed by [name].  Academic/Vocational History: Highest level of educational attainment: 16 years. She graduated from high school and earned [employer] in both nursing and criminal justice. She described herself as an average to above average (A/B) student in academic settings. Math was noted as a likely relative weakness.  History of developmental delay: Denied. History of grade repetition: Denied. Enrollment in special education courses: Denied. History of LD/ADHD: Denied.  Employment: She currently receives disability benefits due to her psychiatric history. Prior to this, she worked as a Marine scientist.   Evaluation Results:   Behavioral Observations: Ms. Yorke was accompanied by her wife Adonis Brook, arrived to her appointment on time, and was appropriately dressed and groomed. She appeared alert and oriented. Observed gait and station were within normal limits. Gross motor functioning appeared intact upon informal observation and no abnormal movements (e.g., tremors) were noted during interview. Her affect was generally relaxed and positive, but did range appropriately given the subject being discussed during the clinical interview or the task at hand during testing procedures. Spontaneous speech was fluent and word finding difficulties were not observed during the clinical interview. Thought processes were coherent, organized, and normal in content. Insight into her cognitive difficulties appeared adequate.   During testing, her affect was noted to be flat and appeared quite fatigued. She frequently asked "how much longer" would the evaluation persist and commented  at the end that she was "spent." Her speech was slowed at times and she was soft-spoken overall. Sustained attention was variable and there times where effort/engagement appeared to wax and wane. Task persistence was also variable, with sporadic concerns surrounding adequate effort being present. Very subtle tremors were noticed at times, but not consistently across motorized tasks (e.g., across [employer] but not across Figure Drawing or TMT A/B). Two more challenging executive functioning tasks were not completed due to trouble understanding task instructions (D-KEFS 20 Questions) and worsening fatigue (WCST). Overall, Ms. Mclaurin was largely cooperative with the clinical interview and subsequent testing procedures.   Adequacy of Effort: The validity of neuropsychological testing is limited by the extent to which the individual being tested may be assumed to have exerted adequate effort during testing. Ms. Arrambide expressed her intention to perform to the best of her abilities and exhibited adequate task engagement and persistence. Scores across stand-alone and embedded performance validity measures were variable with several scores scoring below expectation. I do not have reason to believe that Ms. Pines was intentionally performing poorly across some aspects of testing. However, the results of the current evaluation should be interpreted with caution and are likely to underestimate her true abilities to an unknown degree.  Test Results: Ms. Abernathy was fully oriented at the time of the current evaluation.  Intellectual abilities based upon educational and vocational attainment were estimated to be in the average range. Premorbid abilities were estimated to be within the average range based upon a single-word reading test.  Processing speed was variable, ranging from the exceptionally low to average normative ranges. Basic attention was average. More complex attention (e.g., working memory)  was also average. Executive functioning was somewhat variable but largely in the exceptionally low normative ranges.  Assessed receptive language abilities were average. Likewise, Ms. Vanvoorhis generally did not exhibit any difficulties comprehending task instructions (outside of 20 Questions) and answered all questions asked of her appropriately. Regarding expressive language, verbal fluency was exceptionally low to below average while confrontation naming was average.     Assessed visuospatial/visuoconstructional abilities were exceptionally low to below average. Points were lost on her drawing of a clock due to spatial abnormalities in numerical placement, as well as incorrect hand positioning. Points were lost on her copy of a complex figure due to numerous visual distortions and very poor organization/planning. Regarding the latter, she appeared to approach the figure in a piecemeal manner rather than focusing on the outer figure and slowly filling in internal aspects.    Learning (i.e., encoding) of novel verbal and visual information was exceptionally low to well below average. Across learning trials of a visual task, she actually dramatically worsened over learning trials rather than showed improvement, which is relatively abnormal. Spontaneous delayed recall (i.e., retrieval) of previously learned information was exceptionally low to below average. Retention rates were 84% across a story learning task, 100% across a list learning task, and 67% across a shape learning task. Performance across recognition tasks was exceptionally low to below average, suggesting some evidence for information consolidation.   Results of emotional screening instruments suggested that recent symptoms of generalized anxiety were in the moderate range, while symptoms of depression were within the mild range. A screening instrument assessing recent sleep quality suggested the presence of moderate sleep dysfunction.  Tables  of Scores:   Note: This summary of test scores accompanies the interpretive report and should not be considered in isolation without reference to the appropriate sections in the text. Descriptors are based on appropriate normative data and may be adjusted based on clinical judgment. The terms "impaired" and "within normal limits (WNL)" are used when a more specific level of functioning cannot be determined.       Effort Testing:   DESCRIPTOR       ACS Word Choice: --- --- Within Expectation  Dot Counting Test: --- --- Below Expectation  NAB EVI: --- --- Below Expectation  D-KEFS Color Word Effort Index: --- --- Within Expectation       Orientation:      Raw Score Percentile   NAB Orientation, Form 1 29/29 --- ---       Cognitive Screening:           Raw Score Percentile   SLUMS: 21/30 --- ---       Intellectual Functioning:           Standard Score Percentile   Test of Premorbid Functioning: 103 58 Average  Memory:          NAB Memory Module, Form 1: Standard Score/ T Score Percentile   List Learning       Total Trials 1-3 17/36 (29) 2 Exceptionally Low    List B 1/12 (20) <1 Exceptionally Low    Short Delay Free Recall 3/12 (21) <1 Exceptionally Low    Long Delay Free Recall 3/12 (22) <1 Exceptionally Low    Retention Percentage 100 (51) 54 Average    Recognition Discriminability 0 (23) <1 Exceptionally Low  Shape Learning  Total Trials 1-3 11/27 (29) 2 Exceptionally Low    Delayed Recall 4/9 (32) 4 Well Below Average    Retention Percentage 67 (38) 12 Below Average    Recognition Discriminability 5 (38) 12 Below Average  Story Learning       Immediate Recall 43/80 (27) 1 Exceptionally Low    Delayed Recall 21/40 (28) 2 Exceptionally Low    Retention Percentage 84 (46) 34 Average  Daily Living Memory       Immediate Recall 36/51 (31) 3 Well Below Average    Delayed Recall 7/17 (19) <1 Exceptionally Low    Retention Percentage 50 (14) <1 Exceptionally Low     Recognition Hits 6/10 (28) 2 Exceptionally Low       Attention/Executive Function:          Trail Making Test (TMT): Raw Score (T Score) Percentile     Part A 57 secs.,  0 errors (28) 2 Exceptionally Low    Part B 278 secs.,  2 errors (15) <1 Exceptionally Low         Scaled Score Percentile   WAIS-IV Coding: 6 9 Below Average       NAB Attention Module, Form 1: T Score Percentile     Digits Forward 52 58 Average    Digits Backwards 49 46 Average       D-KEFS Color-Word Interference Test: Raw Score (Scaled Score) Percentile     Color Naming 44 secs. (4) 2 Well Below Average    Word Reading 22 secs. (11) 63 Average    Inhibition 102 secs. (2) <1 Exceptionally Low      Total Errors 23 errors (1) <1 Exceptionally Low    Inhibition/Switching 82 secs. (8) 25 Average      Total Errors 16 errors (1) <1 Exceptionally Low       D-KEFS Verbal Fluency Test: Raw Score (Scaled Score) Percentile     Letter Total Correct 22 (6) 9 Below Average    Category Total Correct 16 (1) <1 Exceptionally Low    Category Switching Total Correct 5 (1) <1 Exceptionally Low    Category Switching Accuracy 3 (1) <1 Exceptionally Low      Total Set Loss Errors 4 (8) 25 Average      Total Repetition Errors 1 (12) 75 Above Average       Language:          Verbal Fluency Test: Raw Score (T Score) Percentile     Phonemic Fluency (FAS) 22 (30) 2 Well Below Average    Animal Fluency 11 (24) <1 Exceptionally Low        NAB Language Module, Form 1: T Score Percentile     Auditory Comprehension 54 66 Average    Naming 31/31 (53) 62 Average       Visuospatial/Visuoconstruction:      Raw Score Percentile   Clock Drawing: 7/10 --- Within Normal Limits       NAB Spatial Module, Form 1: T Score Percentile     Figure Drawing Copy 21 <1 Exceptionally Low        Scaled Score Percentile   WAIS-IV Block Design: 6 9 Below Average       Mood and Personality:      Raw Score Percentile   Beck Depression Inventory  - II: 16 --- Mild  PROMIS Anxiety Questionnaire: 25 --- Moderate       Additional Questionnaires:      Raw Score Percentile   PROMIS Sleep  Disturbance Questionnaire: 31 --- Moderate   Informed Consent and Coding/Compliance:   The current evaluation represents a clinical evaluation for the purposes previously outlined by the referral source and is in no way reflective of a forensic evaluation.   Ms. Salak was provided with a verbal description of the nature and purpose of the present neuropsychological evaluation. Also reviewed were the foreseeable risks and/or discomforts and benefits of the procedure, limits of confidentiality, and mandatory reporting requirements of this provider. The patient was given the opportunity to ask questions and receive answers about the evaluation. Oral consent to participate was provided by the patient.   This evaluation was conducted by Christia Reading, Ph.D., licensed clinical neuropsychologist. Ms. Beissel completed a clinical interview with Dr. Melvyn Novas, billed as one unit [phone], and 140 minutes of cognitive testing and scoring, billed as one unit [id number] and four additional units [id number]. Psychometrist Milana Kidney, B.S., assisted Dr. Melvyn Novas with test administration and scoring procedures. As a separate and discrete service, Dr. Melvyn Novas spent a total of 160 minutes in interpretation and report writing billed as one unit [phone] and two units 96133.

## 2020-10-16 NOTE — Progress Notes (Signed)
   Psychometrician Note   Cognitive testing was administered to Sheran Luz by Milana Kidney, B.S. (psychometrist) under the supervision of Dr. Christia Reading, Ph.D., licensed psychologist on 10/16/20. Ms. Badour did not appear overtly distressed by the testing session per behavioral observation or responses across self-report questionnaires. Dr. Christia Reading, Ph.D. checked in with Ms. Crissman as needed to manage any distress related to testing procedures (if applicable). Rest breaks were offered.    The battery of tests administered was selected by Dr. Christia Reading, Ph.D. with consideration to Ms. Billups's current level of functioning, the nature of her symptoms, emotional and behavioral responses during interview, level of literacy, observed level of motivation/effort, and the nature of the referral question. This battery was communicated to the psychometrist. Communication between Dr. Christia Reading, Ph.D. and the psychometrist was ongoing throughout the evaluation and Dr. Christia Reading, Ph.D. was immediately accessible at all times. Dr. Christia Reading, Ph.D. provided supervision to the psychometrist on the date of this service to the extent necessary to assure the quality of all services provided.    Royetta Crochet Fambro will return within approximately 1-2 weeks for an interactive feedback session with Dr. Melvyn Novas at which time her test performances, clinical impressions, and treatment recommendations will be reviewed in detail. Ms. Matusik understands she can contact our office should she require our assistance before this time.  A total of 140 minutes of billable time were spent face-to-face with Ms. Centrella by the psychometrist. This includes both test administration and scoring time. Billing for these services is reflected in the clinical report generated by Dr. Christia Reading, Ph.D..  This note reflects time spent with the psychometrician and does not include test scores  or any clinical interpretations made by Dr. Melvyn Novas. The full report will follow in a separate note.

## 2020-10-19 DIAGNOSIS — F419 Anxiety disorder, unspecified: Secondary | ICD-10-CM | POA: Diagnosis not present

## 2020-10-19 DIAGNOSIS — E538 Deficiency of other specified B group vitamins: Secondary | ICD-10-CM | POA: Diagnosis not present

## 2020-10-19 DIAGNOSIS — F3110 Bipolar disorder, current episode manic without psychotic features, unspecified: Secondary | ICD-10-CM | POA: Diagnosis not present

## 2020-10-19 DIAGNOSIS — E876 Hypokalemia: Secondary | ICD-10-CM | POA: Diagnosis not present

## 2020-10-19 DIAGNOSIS — J189 Pneumonia, unspecified organism: Secondary | ICD-10-CM | POA: Diagnosis not present

## 2020-10-19 DIAGNOSIS — F603 Borderline personality disorder: Secondary | ICD-10-CM | POA: Diagnosis not present

## 2020-10-26 ENCOUNTER — Telehealth: Payer: Self-pay | Admitting: Family Medicine

## 2020-10-26 NOTE — Telephone Encounter (Signed)
I left [name] a voicemail letting her know that VO are approved.

## 2020-10-26 NOTE — Telephone Encounter (Signed)
Shirese a Physical Therapist calling from [facility] is calling to get verbal orders for a mixed visit and for 1 week 1 for a discharge visit, please advise. CB is [phone]

## 2020-10-29 DIAGNOSIS — E876 Hypokalemia: Secondary | ICD-10-CM | POA: Diagnosis not present

## 2020-10-29 DIAGNOSIS — F603 Borderline personality disorder: Secondary | ICD-10-CM | POA: Diagnosis not present

## 2020-10-29 DIAGNOSIS — E538 Deficiency of other specified B group vitamins: Secondary | ICD-10-CM | POA: Diagnosis not present

## 2020-10-29 DIAGNOSIS — F3110 Bipolar disorder, current episode manic without psychotic features, unspecified: Secondary | ICD-10-CM | POA: Diagnosis not present

## 2020-10-29 DIAGNOSIS — F419 Anxiety disorder, unspecified: Secondary | ICD-10-CM | POA: Diagnosis not present

## 2020-10-29 DIAGNOSIS — J189 Pneumonia, unspecified organism: Secondary | ICD-10-CM | POA: Diagnosis not present

## 2020-10-31 ENCOUNTER — Other Ambulatory Visit: Payer: Self-pay

## 2020-10-31 ENCOUNTER — Ambulatory Visit (INDEPENDENT_AMBULATORY_CARE_PROVIDER_SITE_OTHER): Payer: Medicare Other | Admitting: Psychology

## 2020-10-31 DIAGNOSIS — T56891S Toxic effect of other metals, accidental (unintentional), sequela: Secondary | ICD-10-CM

## 2020-10-31 DIAGNOSIS — F330 Major depressive disorder, recurrent, mild: Secondary | ICD-10-CM

## 2020-10-31 DIAGNOSIS — F319 Bipolar disorder, unspecified: Secondary | ICD-10-CM | POA: Diagnosis not present

## 2020-10-31 DIAGNOSIS — R4189 Other symptoms and signs involving cognitive functions and awareness: Secondary | ICD-10-CM

## 2020-10-31 DIAGNOSIS — F4010 Social phobia, unspecified: Secondary | ICD-10-CM | POA: Diagnosis not present

## 2020-10-31 NOTE — Progress Notes (Signed)
   Neuropsychology Feedback Session Tillie Rung. [place] Department of Neurology  Reason for Referral:   JEANNELLE WIENS a 60 y.o. left-handed Caucasian female referred by Narda Amber, D.O.,to characterize hercurrent cognitive functioning and assist with diagnostic clarity and treatment planning in the context of subjective cognitive decline, numerous psychiatric and medical comorbidities, and potential for polypharmacy.  Feedback:   Ms. Nix completed a comprehensive neuropsychological evaluation on 10/16/2020. Please refer to that encounter for the full report and recommendations. Briefly, results suggested the results of the current evaluation should be interpreted with caution and are likely to underestimate her true abilities to an unknown degree. If taken at face value, Ms. Esh's pattern of performance is suggestive of prominent dysfunction surrounding executive functioning, verbal fluency visuospatial abilities, and both encoding (i.e., learning) and retrieval aspects of memory. Performance variability was exhibited across processing speed and consolidation aspects of memory. The etiology for ongoing dysfunction, regardless of if results are taken at face value or not, is likely multifactorial in nature. Overall, it remains most likely that her current presentation is most attributed to her history of psychiatric distress, combined with sleep dysfunction, polypharmacy, and history of lithium toxicity.  Ms. Renville was accompanied by her wife during the current feedback session. Content of the current session focused on the results of her neuropsychological evaluation. Ms. Brundage and her wife were given the opportunity to ask questions and their questions were answered. They were encouraged to reach out should additional questions arise. A copy of her report was provided at the conclusion of the visit.      30 minutes were spent conducting the current  feedback session with Ms. Millette, billed as one unit [id number].

## 2020-11-07 ENCOUNTER — Ambulatory Visit
Admission: RE | Admit: 2020-11-07 | Discharge: 2020-11-07 | Disposition: A | Discharge status: Home / Self Care | Payer: Medicare Other | Source: Ambulatory Visit | Attending: Neurology | Admitting: Neurology

## 2020-11-07 DIAGNOSIS — G9389 Other specified disorders of brain: Secondary | ICD-10-CM | POA: Diagnosis not present

## 2020-11-07 DIAGNOSIS — R413 Other amnesia: Secondary | ICD-10-CM

## 2020-11-07 DIAGNOSIS — I639 Cerebral infarction, unspecified: Secondary | ICD-10-CM | POA: Diagnosis not present

## 2020-11-07 DIAGNOSIS — S0990XA Unspecified injury of head, initial encounter: Secondary | ICD-10-CM | POA: Diagnosis not present

## 2020-11-07 MED ORDER — GADOBENATE DIMEGLUMINE 529 MG/ML IV SOLN
14.0000 mL | Freq: Once | INTRAVENOUS | Status: AC | PRN
  Administered 2020-11-07: 14 mL via INTRAVENOUS

## 2020-11-13 ENCOUNTER — Telehealth: Payer: Self-pay

## 2020-11-13 NOTE — Telephone Encounter (Signed)
Called patient and left a message for a call back.  

## 2020-11-13 NOTE — Telephone Encounter (Signed)
-----   Message from Alda Berthold, DO sent at 11/13/2020 12:00 PM EDT ----- Please let pt know that her MRI brain show some age-related changes which is stable, no new changes as compared to her last study.  Nothing to explain her memory changes and falls.

## 2020-11-14 NOTE — Telephone Encounter (Signed)
Called patient and left a message for a call back.  

## 2020-11-14 NOTE — Telephone Encounter (Signed)
-----   Message from Alda Berthold, DO sent at 11/13/2020 12:00 PM EDT ----- Please let pt know that her MRI brain show some age-related changes which is stable, no new changes as compared to her last study.  Nothing to explain her memory changes and falls.

## 2020-11-15 DIAGNOSIS — N368 Other specified disorders of urethra: Secondary | ICD-10-CM

## 2020-11-15 DIAGNOSIS — N3010 Interstitial cystitis (chronic) without hematuria: Secondary | ICD-10-CM | POA: Insufficient documentation

## 2020-11-15 DIAGNOSIS — M8580 Other specified disorders of bone density and structure, unspecified site: Secondary | ICD-10-CM | POA: Insufficient documentation

## 2020-11-15 DIAGNOSIS — Z90710 Acquired absence of both cervix and uterus: Secondary | ICD-10-CM | POA: Insufficient documentation

## 2020-11-15 DIAGNOSIS — N393 Stress incontinence (female) (male): Secondary | ICD-10-CM

## 2020-11-15 HISTORY — DX: Stress incontinence (female) (male): N39.3

## 2020-11-15 HISTORY — DX: Interstitial cystitis (chronic) without hematuria: N30.10

## 2020-11-15 HISTORY — DX: Other specified disorders of urethra: N36.8

## 2020-11-15 HISTORY — DX: Acquired absence of both cervix and uterus: Z90.710

## 2020-11-15 NOTE — Telephone Encounter (Signed)
Returned patients call and informed her of MRI results. Patient verbalized understanding and had no questions or concerns.

## 2020-11-15 NOTE — Telephone Encounter (Signed)
Patient left a message returning a call to [facility].

## 2020-11-19 DIAGNOSIS — Z124 Encounter for screening for malignant neoplasm of cervix: Secondary | ICD-10-CM | POA: Diagnosis not present

## 2020-11-19 DIAGNOSIS — Z6829 Body mass index (BMI) 29.0-29.9, adult: Secondary | ICD-10-CM | POA: Diagnosis not present

## 2020-11-19 DIAGNOSIS — Z113 Encounter for screening for infections with a predominantly sexual mode of transmission: Secondary | ICD-10-CM | POA: Diagnosis not present

## 2020-11-19 DIAGNOSIS — Z1231 Encounter for screening mammogram for malignant neoplasm of breast: Secondary | ICD-10-CM | POA: Diagnosis not present

## 2020-11-19 DIAGNOSIS — R6882 Decreased libido: Secondary | ICD-10-CM | POA: Diagnosis not present

## 2020-11-19 DIAGNOSIS — E8941 Symptomatic postprocedural ovarian failure: Secondary | ICD-10-CM | POA: Diagnosis not present

## 2020-11-19 DIAGNOSIS — Z01419 Encounter for gynecological examination (general) (routine) without abnormal findings: Secondary | ICD-10-CM | POA: Diagnosis not present

## 2020-11-19 DIAGNOSIS — N3941 Urge incontinence: Secondary | ICD-10-CM | POA: Diagnosis not present

## 2020-11-19 DIAGNOSIS — N393 Stress incontinence (female) (male): Secondary | ICD-10-CM | POA: Diagnosis not present

## 2020-11-19 DIAGNOSIS — Z01411 Encounter for gynecological examination (general) (routine) with abnormal findings: Secondary | ICD-10-CM | POA: Diagnosis not present

## 2020-11-20 DIAGNOSIS — F29 Unspecified psychosis not due to a substance or known physiological condition: Secondary | ICD-10-CM | POA: Diagnosis not present

## 2020-11-24 ENCOUNTER — Other Ambulatory Visit: Payer: Self-pay | Admitting: Obstetrics

## 2020-11-24 DIAGNOSIS — E8941 Symptomatic postprocedural ovarian failure: Secondary | ICD-10-CM

## 2020-12-05 ENCOUNTER — Other Ambulatory Visit: Payer: Self-pay

## 2020-12-05 ENCOUNTER — Ambulatory Visit (INDEPENDENT_AMBULATORY_CARE_PROVIDER_SITE_OTHER): Payer: Medicare Other

## 2020-12-05 DIAGNOSIS — Z0000 Encounter for general adult medical examination without abnormal findings: Secondary | ICD-10-CM | POA: Diagnosis not present

## 2020-12-05 NOTE — Progress Notes (Signed)
Virtual Visit via Telephone Note  I connected with  [name] on 12/05/20 at  1:45 PM EDT by telephone and verified that I am speaking with the correct person using two identifiers.  Location: Patient: Home Provider: Office Persons participating in the virtual visit: patient/Nurse Health Advisor   I discussed the limitations, risks, security and privacy concerns of performing an evaluation and management service by telephone and the availability of in person appointments. The patient expressed understanding and agreed to proceed.  Interactive audio and video telecommunications were attempted between this nurse and patient, however failed, due to patient having technical difficulties OR patient did not have access to video capability.  We continued and completed visit with audio only.  Some vital signs may be absent or patient reported.   Willette Brace, LPN    Subjective:   [name] is a 60 y.o. female who presents for Medicare Annual (Subsequent) preventive examination.  Review of Systems     Cardiac Risk Factors include: obesity (BMI >30kg/m2);dyslipidemia     Objective:    There were no vitals filed for this visit. There is no height or weight on file to calculate BMI.  Advanced Directives 12/05/2020 10/15/2020 08/17/2020 06/15/2020 09/05/2019 06/22/2018 06/18/2018  Does Patient Have a Medical Advance Directive? Yes No No No Yes No No  Type of Advance Directive Living will - - - - - -  Does patient want to make changes to medical advance directive? - - - - - - -  Copy of [place] in Chart? - - - - - - -  Would patient like information on creating a medical advance directive? - - No - Patient declined No - Patient declined - No - Patient declined No - Patient declined  Pre-existing out of facility DNR order (yellow form or pink MOST form) - - - - - - -  Some encounter information is confidential and restricted. Go to Review Flowsheets activity  to see all data.    Current Medications (verified) Outpatient Encounter Medications as of 12/05/2020  Medication Sig  . albuterol (VENTOLIN HFA) 108 (90 Base) MCG/ACT inhaler Inhale 2 puffs into the lungs every 6 (six) hours as needed for wheezing or shortness of breath.  [name] Kitchen buPROPion (WELLBUTRIN XL) 150 MG 24 hr tablet Take 450 mg by mouth every morning.  . divalproex (DEPAKOTE ER) 250 MG 24 hr tablet Take by mouth.  . FENOFIBRATE PO Take by mouth.  . fluticasone (FLONASE) 50 MCG/ACT nasal spray Place 2 sprays into both nostrils daily.  . Fluticasone-Salmeterol (ADVAIR DISKUS) 250-50 MCG/DOSE AEPB Inhale 1 puff into the lungs 2 (two) times daily.  [name] Kitchen gabapentin (NEURONTIN) 300 MG capsule Take 300 mg by mouth at bedtime.  . hydrOXYzine (ATARAX/VISTARIL) 25 MG tablet Take 1 tablet (25 mg total) by mouth 3 (three) times daily as needed for itching.  [name] Kitchen LORazepam (ATIVAN) 0.5 MG tablet Take 0.5 mg by mouth 2 (two) times daily. Take an extra 0.27m as needed.  . meclizine (ANTIVERT) 25 MG tablet Take 1 tablet (25 mg total) by mouth 3 (three) times daily as needed for dizziness.  .[name] Kitchenomeprazole (PRILOSEC) 40 MG capsule Take 40 mg by mouth every evening.  . ondansetron (ZOFRAN) 4 MG tablet Take 1 tablet (4 mg total) by mouth daily as needed for nausea.  . potassium chloride SA (KLOR-CON) 20 MEQ tablet Take 1 tablet (20 mEq total) by mouth daily.  . [DISCONTINUED] Cholecalciferol (VITAMIN D) 50 MCG (2000  UT) tablet Take 2,000 Units by mouth daily. (Patient not taking: Reported on 10/15/2020)  . [DISCONTINUED] lithium carbonate (LITHOBID) 300 MG CR tablet Take 2 tablets (600 mg total) by mouth every evening. (Patient not taking: Reported on 12/05/2020)   No facility-administered encounter medications on file as of 12/05/2020.    Allergies (verified) Morphine and related, Ambien [zolpidem], Duloxetine, Latuda [lurasidone hcl], and Morphine   History: Past Medical History:  Diagnosis Date  . Allergic  rhinitis 03/26/2007  . Autonomic dysfunction 01/11/2015  . Bipolar 1 disorder    Hx of psychosis; present since age 5  . Chronic fatigue 06/21/2017  . Community acquired pneumonia of right lung 08/17/2020  . Complication of anesthesia    agitation  . Costochondritis 02/14/2014  . Diverticular disease of colon 08/06/2020  . Double vision 03/15/2011  . Dyspnea on effort 01/10/2011  . Dysuria 02/15/2013  . Elevated lactic acid level   . Endometriosis 05/11/2008  . Fatty liver disease, nonalcoholic 01/77/9390  . Frequency of urination   . Gastroesophageal reflux disease 08/06/2020  . Headache   . History of duodenal ulcer   . History of lithium toxicity 05/01/2014  . Hyperglycemia 01/10/2011  . Hyperlipidemia, mixed 05/12/2017  . Hyperprolactinemia 06/26/2015  . Insomnia 05/12/2017  . Irritable bowel syndrome 08/06/2020  . Jaw pain 02/13/2012   poss tmj area    . Leukocytosis 05/01/2014  . Multinodular goiter 05/12/2017  . Nocturia   . Nondiabetic gastroparesis 01/11/2015  . Pelvic pain   . Plantar fasciitis 06/11/2007  . Pneumonia 08/20/2020  . Pruritus ani 08/06/2020  . Renal stone 02/13/2014  . Schizoaffective disorder, bipolar type    Unclear if this is not better accounted for by longstanding history of bipolar disorder  . Social anxiety disorder   . Subacute bronchitis 07/10/2018  . Thyroid nodule 02/02/2017  . Urgency of urination   . Vitamin D deficiency 09/23/2011   Past Surgical History:  Procedure Laterality Date  . CARDIAC CATHETERIZATION  10-09-1999  DR KATZ   NORMAL LVF/  NORMAL RCA/  NO CRITICAL DISEASE LEFT CORONARY SYSTEM  . CARDIOVASCULAR STRESS TEST  01-23-2011   NORMAL NUCLEAR STUDY/ NO ISCHEMIA/ EF 81%  . CYSTOSCOPY WITH BIOPSY N/A 06/03/2013   Procedure: CYSTOSCOPY WITH BIOPSY  INSTILLATION OF MARCAINE AND PYRIDIUM;  Surgeon: Hanley Ben, MD;  Location: [facility];  Service: Urology;  Laterality: N/A;  . LAPAROSCOPIC  CHOLECYSTECTOMY  11-08-2001  . LAPAROSCOPIC LEFT SALPINGOOPHORECTOMY AND LYSYS ADHESIONS  03-10-2002  . LAPAROSCOPIC REMOVAL OVARY REMNANT  2003   [place]  . LAPAROSCOPY N/A 12/14/2012   Procedure: LAPAROSCOPY DIAGNOSTIC;  Surgeon: Stark Klein, MD;  Location: [facility];  Service: General;  Laterality: N/A;  . TOTAL ABDOMINAL HYSTERECTOMY  2000   W/ RIGHT SALPINGOOPHORECTOMY  . TRANSTHORACIC ECHOCARDIOGRAM  10-13-2010   NORMAL LVF/  EF 55-60%   Family History  Problem Relation Age of Onset  . Sleep apnea Father   . Depression Father   . Alcohol abuse Father   . Hypertension Father   . Migraines Sister        headaches  . Anxiety disorder Sister   . Other Mother        MAC infection  . Anxiety disorder Mother   . Dementia Mother        likely Alzheimer's disease; symptom onset on late 70s/early 84s  . Heart disease Paternal Grandmother   . Hyperlipidemia Paternal Grandmother   . Alcohol abuse Paternal Grandfather   .  Diabetes Neg Hx    Social History   Socioeconomic History  . Marital status: Married    Spouse name: Not on file  . Number of children: 0  . Years of education: 16  . Highest education level: Bachelor's degree (e.g., BA, AB, BS)  Occupational History  . Occupation: Disability    Comment: Nurse  Tobacco Use  . Smoking status: Former Smoker    Packs/day: 0.01    Years: 1.00    Pack years: 0.01    Types: Cigarettes    Quit date: 1980    Years since quitting: 42.3  . Smokeless tobacco: Never Used  . Tobacco comment: ONLY SMOKED FOR 6 MONTHS --  QUIT  YRS AGO  Vaping Use  . Vaping Use: Never used  Substance and Sexual Activity  . Alcohol use: Yes    Comment: occ  . Drug use: Never  . Sexual activity: Not on file  Other Topics Concern  . 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    no  tobacco   Caffeine Use: very little, three times a week   Left handed    Social Determinants of Health   Financial Resource Strain: Low Risk   . Difficulty of Paying Living Expenses: Not hard at all  Food Insecurity: No Food Insecurity  . Worried About Charity fundraiser in the Last Year: Never true  . Ran Out of Food in the Last Year: Never true  Transportation Needs: No Transportation Needs  . Lack of Transportation (Medical): No  . Lack of Transportation (Non-Medical): No  Physical Activity: Sufficiently Active  . Days of Exercise per Week: 7 days  . Minutes of Exercise per Session: 30 min  Stress: No Stress Concern Present  . Feeling of Stress : Not at all  Social Connections: Moderately Integrated  . Frequency of Communication with Friends and Family: Once a week  . Frequency of Social Gatherings with Friends and Family: Twice a week  . Attends Religious Services: 1 to 4 times per year  . Active Member of Clubs or Organizations: No  . Attends Archivist Meetings: Never  . Marital Status: Married    Tobacco Counseling Counseling given: Not Answered Comment: ONLY SMOKED FOR 6 MONTHS --  QUIT  YRS AGO   Clinical Intake:  Pre-visit preparation completed: Yes  Pain : No/denies pain     BMI - recorded: 30.15 Nutritional Status: BMI > 30  Obese Nutritional Risks: None Diabetes: No  How often do you need to have someone help you when you read instructions, pamphlets, or other written materials from your doctor or pharmacy?: 1 - Never  Diabetic?No  Interpreter Needed?: No  Information entered by :: [name] Rakes, LPN   Activities of Daily Living In your present state of health, do you have any difficulty performing the following activities: 12/05/2020 08/20/2020  Hearing? N N  Vision? N N  Difficulty concentrating or making decisions? Y N  Comment memory -  Walking or climbing stairs? Y N  Dressing or bathing? N N  Doing errands, shopping? N N   Preparing Food and eating ? N -  Using the Toilet? N -  In the past six months, have you accidently leaked urine? Y -  Comment pad -  Do you have problems with  loss of bowel control? N -  Managing your Medications? N -  Managing your Finances? N -  Housekeeping or managing your Housekeeping? N -  Some recent data might be hidden    Patient Care Team: [name], [name], MD as PCP - General (Family Medicine) Norma Fredrickson, MD (Psychiatry) Juanita Craver, MD as Attending Physician (Gastroenterology) Garvin Fila, MD as Consulting Physician (Neurology) Lowella Bandy, MD (Inactive) as Consulting Physician (Urology) Troy Sine, MD as Consulting Physician (Cardiology) Eldridge Abrahams, MD as Referring Physician (Neurology) Aloha Gell, MD as Consulting Physician (Obstetrics and Gynecology) Mariella Saa, MD as Consulting Physician (Neurology) Alda Berthold, DO as Consulting Physician (Neurology)  Indicate any recent Medical Services you may have received from other than [facility] providers in the past year (date may be approximate).     Assessment:   This is a routine wellness examination for [facility].  Hearing/Vision screen  Hearing Screening   _0  _1  _2  _3  _4  _5  _6  _7  _8   Right ear:           Left ear:           Comments: Pt denies any hearing issue   Vision Screening Comments: Pt follows up with [facility] for annual eye exams   Dietary issues and exercise activities discussed: Current Exercise Habits: Home exercise routine, Type of exercise: walking, Time (Minutes): 30, Frequency (Times/Week): 7, Weekly Exercise (Minutes/Week): 210  Goals    . DIET - REDUCE CALORIE INTAKE     1800 cal/day.    . Patient Stated     Lose weight and get in shape       Depression Screen PHQ 2/9 Scores 12/05/2020 11/29/2019  PHQ - 2 Score 0 0    Fall Risk Fall Risk  12/05/2020 10/15/2020 11/29/2019 09/05/2019 10/08/2018  Falls in the past  year? 1 1 0 0 1  Number falls in past yr: 1 1 0 0 1  Injury with Fall? 1 1 0 0 0  Comment concussion - - - -  Risk for fall due to : Impaired vision;Impaired balance/gait;Impaired mobility;History of fall(s) - - - Other (Comment)  Follow up Falls prevention discussed - Education provided - Falls evaluation completed;Education provided;Falls prevention discussed    FALL RISK PREVENTION PERTAINING TO THE HOME:  Any stairs in or around the home? Yes  If so, are there any without handrails? No  Home free of loose throw rugs in walkways, pet beds, electrical cords, etc? Yes  Adequate lighting in your home to reduce risk of falls? Yes   ASSISTIVE DEVICES UTILIZED TO PREVENT FALLS:  Life alert? No  Use of a cane, walker or w/c? No  Grab bars in the bathroom? No  Shower chair or bench in shower? Yes  Elevated toilet seat or a handicapped toilet? No   TIMED UP AND GO:  Was the test performed? No .      Cognitive Function:     6CIT Screen 12/05/2020  What Year? 4 points  What month? 3 points  Count back from 20 0 points  Months in reverse 4 points  Repeat phrase 4 points    Immunizations Immunization History  Administered Date(s) Administered  . Influenza Split 07/18/2011  . Influenza, Quadrivalent, Recombinant, Inj, Pf 05/19/2019  . Influenza,inj,Quad PF,6+ Mos 07/02/2018, 08/06/2020  . Moderna Sars-Covid-2 Vaccination 10/17/2019, 11/17/2019, 06/29/2020  . Pneumococcal Polysaccharide-23 07/02/2018  . Td 06/13/2009  . Tdap 06/13/2009    TDAP status: Due, Education has been provided  regarding the importance of this vaccine. Advised may receive this vaccine at local pharmacy or Health Dept. Aware to provide a copy of the vaccination record if obtained from local pharmacy or Health Dept. Verbalized acceptance and understanding.  Flu Vaccine status: Declined, Education has been provided regarding the importance of this vaccine but patient still declined. Advised may receive  this vaccine at local pharmacy or Health Dept. Aware to provide a copy of the vaccination record if obtained from local pharmacy or Health Dept. Verbalized acceptance and understanding.    Covid-19 vaccine status: Completed vaccines  Qualifies for Shingles Vaccine? Yes   Zostavax completed No   Shingrix Completed?: No.    Education has been provided regarding the importance of this vaccine. Patient has been advised to call insurance company to determine out of pocket expense if they have not yet received this vaccine. Advised may also receive vaccine at local pharmacy or Health Dept. Verbalized acceptance and understanding.  Screening Tests Health Maintenance  Topic Date Due  . TETANUS/TDAP  06/14/2019  . MAMMOGRAM  09/19/2019  . PAP SMEAR-Modifier  07/07/2021 (Originally 12/01/2013)  . INFLUENZA VACCINE  03/18/2021  . COLONOSCOPY (Pts 45-65yr Insurance coverage will need to be confirmed)  10/11/2022  . COVID-19 Vaccine  Completed  . Hepatitis C Screening  Completed  . HIV Screening  Completed  . HPV VACCINES  Aged Out    Health Maintenance  Health Maintenance Due  Topic Date Due  . TETANUS/TDAP  06/14/2019  . MAMMOGRAM  09/19/2019    Colorectal cancer screening: Type of screening: Colonoscopy. Completed 10/11/12. Repeat every 10 years  Mammogram status: Completed 09/18/17. Repeat every year     Additional Screening:  Hepatitis C Screening:  Completed 04/10/13  Vision Screening: Recommended annual ophthalmology exams for early detection of glaucoma and other disorders of the eye. Is the patient up to date with their annual eye exam?  Yes  Who is the provider or what is the name of the office in which the patient attends annual eye exams? [facility]  If pt is not established with a provider, would they like to be referred to a provider to establish care? No .   Dental Screening: Recommended annual dental exams for proper oral hygiene  Community Resource Referral  / Chronic Care Management: CRR required this visit?  No   CCM required this visit?  No      Plan:     I have personally reviewed and noted the following in the patient's chart:   . Medical and social history . Use of alcohol, tobacco or illicit drugs  . Current medications and supplements . Functional ability and status . Nutritional status . Physical activity . Advanced directives . List of other physicians . Hospitalizations, surgeries, and ER visits in previous 12 months . Vitals . Screenings to include cognitive, depression, and falls . Referrals and appointments  In addition, I have reviewed and discussed with patient certain preventive protocols, quality metrics, and best practice recommendations. A written personalized care plan for preventive services as well as general preventive health recommendations were provided to patient.     TWillette Brace LPN   [id number]  Nurse Notes: None

## 2020-12-05 NOTE — Patient Instructions (Addendum)
[name] , Thank you for taking time to come for your Medicare Wellness Visit. I appreciate your ongoing commitment to your health goals. Please review the following plan we discussed and let me know if I can assist you in the future.   Screening recommendations/referrals: Colonoscopy: Done 10/11/12 Mammogram: Done 09/18/17 follow up Due  Recommended yearly ophthalmology/optometry visit for glaucoma screening and checkup Recommended yearly dental visit for hygiene and checkup  Vaccinations: Influenza vaccine: Declined Tdap vaccine: Due and discussed  Shingles vaccine: Shingrix discussed. Please contact your pharmacy for coverage information.   Covid-19: Completed 10/17/19 & 11/17/19  Advanced directives: Please bring a copy of your health care power of attorney and living will to the office at your convenience.  Conditions/risks identified: Lose weight and get in shape   Next appointment: Follow up in one year for your annual wellness visit.   Preventive Care 40-64 Years, Female Preventive care refers to lifestyle choices and visits with your health care provider that can promote health and wellness. What does preventive care include?  A yearly physical exam. This is also called an annual well check.  Dental exams once or twice a year.  Routine eye exams. Ask your health care provider how often you should have your eyes checked.  Personal lifestyle choices, including:  Daily care of your teeth and gums.  Regular physical activity.  Eating a healthy diet.  Avoiding tobacco and drug use.  Limiting alcohol use.  Practicing safe sex.  Taking low-dose aspirin daily starting at age 41.  Taking vitamin and mineral supplements as recommended by your health care provider. What happens during an annual well check? The services and screenings done by your health care provider during your annual well check will depend on your age, overall health, lifestyle risk factors, and family  history of disease. Counseling  Your health care provider may ask you questions about your:  Alcohol use.  Tobacco use.  Drug use.  Emotional well-being.  Home and relationship well-being.  Sexual activity.  Eating habits.  Work and work Statistician.  Method of birth control.  Menstrual cycle.  Pregnancy history. Screening  You may have the following tests or measurements:  Height, weight, and BMI.  Blood pressure.  Lipid and cholesterol levels. These may be checked every 5 years, or more frequently if you are over 19 years old.  Skin check.  Lung cancer screening. You may have this screening every year starting at age 69 if you have a 30-pack-year history of smoking and currently smoke or have quit within the past 15 years.  Fecal occult blood test (FOBT) of the stool. You may have this test every year starting at age 36.  Flexible sigmoidoscopy or colonoscopy. You may have a sigmoidoscopy every 5 years or a colonoscopy every 10 years starting at age 41.  Hepatitis C blood test.  Hepatitis B blood test.  Sexually transmitted disease (STD) testing.  Diabetes screening. This is done by checking your blood sugar (glucose) after you have not eaten for a while (fasting). You may have this done every 1-3 years.  Mammogram. This may be done every 1-2 years. Talk to your health care provider about when you should start having regular mammograms. This may depend on whether you have a family history of breast cancer.  BRCA-related cancer screening. This may be done if you have a family history of breast, ovarian, tubal, or peritoneal cancers.  Pelvic exam and Pap test. This may be done every 3 years  starting at age 58. Starting at age 45, this may be done every 5 years if you have a Pap test in combination with an HPV test.  Bone density scan. This is done to screen for osteoporosis. You may have this scan if you are at high risk for osteoporosis. Discuss your test  results, treatment options, and if necessary, the need for more tests with your health care provider. Vaccines  Your health care provider may recommend certain vaccines, such as:  Influenza vaccine. This is recommended every year.  Tetanus, diphtheria, and acellular pertussis (Tdap, Td) vaccine. You may need a Td booster every 10 years.  Zoster vaccine. You may need this after age 53.  Pneumococcal 13-valent conjugate (PCV13) vaccine. You may need this if you have certain conditions and were not previously vaccinated.  Pneumococcal polysaccharide (PPSV23) vaccine. You may need one or two doses if you smoke cigarettes or if you have certain conditions. Talk to your health care provider about which screenings and vaccines you need and how often you need them. This information is not intended to replace advice given to you by your health care provider. Make sure you discuss any questions you have with your health care provider. Document Released: 08/31/2015 Document Revised: 04/23/2016 Document Reviewed: 06/05/2015 Elsevier Interactive Patient Education  2017 [employer] Prevention in the Home Falls can cause injuries. They can happen to people of all ages. There are many things you can do to make your home safe and to help prevent falls. What can I do on the outside of my home?  Regularly fix the edges of walkways and driveways and fix any cracks.  Remove anything that might make you trip as you walk through a door, such as a raised step or threshold.  Trim any bushes or trees on the path to your home.  Use bright outdoor lighting.  Clear any walking paths of anything that might make someone trip, such as rocks or tools.  Regularly check to see if handrails are loose or broken. Make sure that both sides of any steps have handrails.  Any raised decks and porches should have guardrails on the edges.  Have any leaves, snow, or ice cleared regularly.  Use sand or salt on  walking paths during winter.  Clean up any spills in your garage right away. This includes oil or grease spills. What can I do in the bathroom?  Use night lights.  Install grab bars by the toilet and in the tub and shower. Do not use towel bars as grab bars.  Use non-skid mats or decals in the tub or shower.  If you need to sit down in the shower, use a plastic, non-slip stool.  Keep the floor dry. Clean up any water that spills on the floor as soon as it happens.  Remove soap buildup in the tub or shower regularly.  Attach bath mats securely with double-sided non-slip rug tape.  Do not have throw rugs and other things on the floor that can make you trip. What can I do in the bedroom?  Use night lights.  Make sure that you have a light by your bed that is easy to reach.  Do not use any sheets or blankets that are too big for your bed. They should not hang down onto the floor.  Have a firm chair that has side arms. You can use this for support while you get dressed.  Do not have throw rugs and  other things on the floor that can make you trip. What can I do in the kitchen?  Clean up any spills right away.  Avoid walking on wet floors.  Keep items that you use a lot in easy-to-reach places.  If you need to reach something above you, use a strong step stool that has a grab bar.  Keep electrical cords out of the way.  Do not use floor polish or wax that makes floors slippery. If you must use wax, use non-skid floor wax.  Do not have throw rugs and other things on the floor that can make you trip. What can I do with my stairs?  Do not leave any items on the stairs.  Make sure that there are handrails on both sides of the stairs and use them. Fix handrails that are broken or loose. Make sure that handrails are as long as the stairways.  Check any carpeting to make sure that it is firmly attached to the stairs. Fix any carpet that is loose or worn.  Avoid having throw  rugs at the top or bottom of the stairs. If you do have throw rugs, attach them to the floor with carpet tape.  Make sure that you have a light switch at the top of the stairs and the bottom of the stairs. If you do not have them, ask someone to add them for you. What else can I do to help prevent falls?  Wear shoes that:  Do not have high heels.  Have rubber bottoms.  Are comfortable and fit you well.  Are closed at the toe. Do not wear sandals.  If you use a stepladder:  Make sure that it is fully opened. Do not climb a closed stepladder.  Make sure that both sides of the stepladder are locked into place.  Ask someone to hold it for you, if possible.  Clearly mark and make sure that you can see:  Any grab bars or handrails.  First and last steps.  Where the edge of each step is.  Use tools that help you move around (mobility aids) if they are needed. These include:  Canes.  Walkers.  Scooters.  Crutches.  Turn on the lights when you go into a dark area. Replace any light bulbs as soon as they burn out.  Set up your furniture so you have a clear path. Avoid moving your furniture around.  If any of your floors are uneven, fix them.  If there are any pets around you, be aware of where they are.  Review your medicines with your doctor. Some medicines can make you feel dizzy. This can increase your chance of falling. Ask your doctor what other things that you can do to help prevent falls. This information is not intended to replace advice given to you by your health care provider. Make sure you discuss any questions you have with your health care provider. Document Released: 05/31/2009 Document Revised: 01/10/2016 Document Reviewed: 09/08/2014 Elsevier Interactive Patient Education  2017 [employer].

## 2020-12-19 DIAGNOSIS — F3131 Bipolar disorder, current episode depressed, mild: Secondary | ICD-10-CM | POA: Diagnosis not present

## 2020-12-22 ENCOUNTER — Other Ambulatory Visit: Payer: Self-pay | Admitting: Family Medicine

## 2020-12-28 DIAGNOSIS — Z23 Encounter for immunization: Secondary | ICD-10-CM | POA: Diagnosis not present

## 2021-01-15 ENCOUNTER — Other Ambulatory Visit: Payer: Self-pay

## 2021-01-16 ENCOUNTER — Encounter: Payer: Self-pay | Admitting: Family Medicine

## 2021-01-16 ENCOUNTER — Ambulatory Visit (INDEPENDENT_AMBULATORY_CARE_PROVIDER_SITE_OTHER): Payer: Medicare Other | Admitting: Family Medicine

## 2021-01-16 VITALS — BP 124/60 | HR 78 | Temp 97.9°F | Wt 152.5 lb

## 2021-01-16 DIAGNOSIS — E0789 Other specified disorders of thyroid: Secondary | ICD-10-CM | POA: Diagnosis not present

## 2021-01-16 DIAGNOSIS — F3131 Bipolar disorder, current episode depressed, mild: Secondary | ICD-10-CM | POA: Diagnosis not present

## 2021-01-16 DIAGNOSIS — J029 Acute pharyngitis, unspecified: Secondary | ICD-10-CM | POA: Diagnosis not present

## 2021-01-16 LAB — POCT RAPID STREP A (OFFICE): Rapid Strep A Screen: NEGATIVE

## 2021-01-16 NOTE — Patient Instructions (Signed)
Thyroid Nodule  A thyroid nodule is an isolated growth of thyroid cells that forms a lump in your thyroid gland. The thyroid gland is a butterfly-shaped gland. It is found in the lower front of your neck. This gland sends chemical messengers (hormones) through your blood to all parts of your body. These hormones are important in regulating your body temperature and helping your body to use energy. Thyroid nodules are common. Most are not cancerous (benign). You may have one nodule or several nodules. Different types of thyroid nodules include nodules that:  Grow and fill with fluid (thyroid cysts).  Produce too much thyroid hormone (hot nodules or hyperthyroid).  Produce no thyroid hormone (cold nodules or hypothyroid).  Form from cancer cells (thyroid cancers). What are the causes? In most cases, the cause of this condition is not known. What increases the risk? The following factors may make you more likely to develop this condition.  Age. Thyroid nodules become more common in people who are older than 59 years of age.  Gender. ? Benign thyroid nodules are more common in women. ? Cancerous (malignant) thyroid nodules are more common in men.  A family history that includes: ? Thyroid nodules. ? Pheochromocytoma. ? Thyroid carcinoma. ? Hyperparathyroidism.  Certain kinds of thyroid diseases, such as Hashimoto's thyroiditis.  Lack of iodine in your diet.  A history of head and neck radiation, such as from previous cancer treatment. What are the signs or symptoms? In many cases, there are no symptoms. If you have symptoms, they may include:  A lump in your lower neck.  Feeling a lump or tickle in your throat.  Pain in your neck, jaw, or ear.  Having trouble swallowing. Hot nodules may cause symptoms that include:  Weight loss.  Warm, flushed skin.  Feeling hot.  Feeling nervous.  A racing heartbeat. Cold nodules may cause symptoms that include:  Weight  gain.  Dry skin.  Brittle hair. This may also occur with hair loss.  Feeling cold.  Fatigue. Thyroid cancer nodules may cause symptoms that include:  Hard nodules that feel stuck to the thyroid gland.  Hoarseness.  Lumps in the glands near your thyroid (lymph nodes). How is this diagnosed? A thyroid nodule may be felt by your health care provider during a physical exam. This condition may also be diagnosed based on your symptoms. You may also have tests, including:  An ultrasound. This may be done to confirm the diagnosis.  A biopsy. This involves taking a sample from the nodule and looking at it under a microscope.  Blood tests to make sure that your thyroid is working properly.  A thyroid scan. This test uses a radioactive tracer injected into a vein to create an image of the thyroid gland on a computer screen.  Imaging tests such as MRI or CT scan. These may be done if: ? Your nodule is large. ? Your nodule is blocking your airway. ? Cancer is suspected. How is this treated? Treatment depends on the cause and size of your nodule or nodules. If the nodule is benign, treatment may not be necessary. Your health care provider may monitor the nodule to see if it goes away without treatment. If the nodule continues to grow, is cancerous, or does not go away, treatment may be needed. Treatment may include:  Having a cystic nodule drained with a needle.  Ablation therapy. In this treatment, alcohol is injected into the area of the nodule to destroy the cells. Ablation with heat (  thermal ablation) may also be used.  Radioactive iodine. In this treatment, radioactive iodine is given as a pill or liquid that you drink. This substance causes the thyroid nodule to shrink.  Surgery to remove the nodule. Part or all of your thyroid gland may need to be removed as well.  Medicines. Follow these instructions at home:  Pay attention to any changes in your nodule.  Take  over-the-counter and prescription medicines only as told by your health care provider.  Keep all follow-up visits as told by your health care provider. This is important. Contact a health care provider if:  Your voice changes.  You have trouble swallowing.  You have pain in your neck, ear, or jaw that is getting worse.  Your nodule gets bigger.  Your nodule starts to make it harder for you to breathe.  Your muscles look like they are shrinking (muscle wasting). Get help right away if:  You have chest pain.  There is a loss of consciousness.  You have a sudden fever.  You feel confused.  You are seeing or hearing things that other people do not see or hear (having hallucinations).  You feel very weak.  You have mood swings.  You feel very restless.  You feel suddenly nauseous or throw up.  You suddenly have diarrhea. Summary  A thyroid nodule is an isolated growth of thyroid cells that forms a lump in your thyroid gland.  Thyroid nodules are common. Most are not cancerous (benign). You may have one nodule or several nodules.  Treatment depends on the cause and size of your nodule or nodules. If the nodule is benign, treatment may not be necessary.  Your health care provider may monitor the nodule to see if it goes away without treatment. If the nodule continues to grow, is cancerous, or does not go away, treatment may be needed. This information is not intended to replace advice given to you by your health care provider. Make sure you discuss any questions you have with your health care provider. Document Revised: 03/19/2018 Document Reviewed: 03/22/2018 Elsevier Patient Education  [place].

## 2021-01-16 NOTE — Progress Notes (Addendum)
Established Patient Office Visit  Subjective:  Patient ID: [name], female    DOB: [birth year]  Age: 60 y.o. MRN: [id number]  CC:  Chief Complaint  Patient presents with   Sore Throat    Hard to swallow, hard to eat, x 2 weeks, prone to strep    HPI [name] presents for sore throat symptoms for at least couple weeks.  She has had some occasional postnasal drainage.  No cough.  Question of low-grade fever intermittently.  She generally does not have a lot of allergies this time of year.  She is concerned specifically about strep.  She has had some bilateral ear pain.  She had COVID test at home a few days ago which was negative.  She has had strep frequently in the past.  No sick contacts.  Some pain with swallowing.  She does have history of right thyroid nodule which was noted 2016.  She had biopsy at that time which was indeterminate.  She had follow-up ultrasound which was reviewed from 2018 which showed increased growth to 2.7 cm.  She was followed by ENT at that time.  She denies any localized pain right side of thyroid.  We reviewed her previous imaging of the thyroid as well as biopsy report from 2016.  She also states that her father had history of thyroid cancer.  Past Medical History:  Diagnosis Date   Allergic rhinitis 03/26/2007   Autonomic dysfunction 01/11/2015   Bipolar 1 disorder    Hx of psychosis; present since age 62   Chronic fatigue 06/21/2017   Community acquired pneumonia of right lung 61/60/7371   Complication of anesthesia    agitation   Costochondritis 02/14/2014   Diverticular disease of colon 08/06/2020   Double vision 03/15/2011   Dyspnea on effort 01/10/2011   Dysuria 02/15/2013   Elevated lactic acid level    Endometriosis 05/11/2008   Fatty liver disease, nonalcoholic 02/11/9484   Frequency of urination    Gastroesophageal reflux disease 08/06/2020   Headache    History of duodenal ulcer    History of lithium toxicity  05/01/2014   Hyperglycemia 01/10/2011   Hyperlipidemia, mixed 05/12/2017   Hyperprolactinemia 06/26/2015   Insomnia 05/12/2017   Irritable bowel syndrome 08/06/2020   Jaw pain 02/13/2012   poss tmj area     Leukocytosis 05/01/2014   Multinodular goiter 05/12/2017   Nocturia    Nondiabetic gastroparesis 01/11/2015   Pelvic pain    Plantar fasciitis 06/11/2007   Pneumonia 08/20/2020   Pruritus ani 08/06/2020   Renal stone 02/13/2014   Schizoaffective disorder, bipolar type    Unclear if this is not better accounted for by longstanding history of bipolar disorder   Social anxiety disorder    Subacute bronchitis 07/10/2018   Thyroid nodule 02/02/2017   Urgency of urination    Vitamin D deficiency 09/23/2011    Past Surgical History:  Procedure Laterality Date   CARDIAC CATHETERIZATION  10-09-1999  DR KATZ   NORMAL LVF/  NORMAL RCA/  NO CRITICAL DISEASE LEFT CORONARY SYSTEM   CARDIOVASCULAR STRESS TEST  01-23-2011   NORMAL NUCLEAR STUDY/ NO ISCHEMIA/ EF 81%   CYSTOSCOPY WITH BIOPSY N/A 06/03/2013   Procedure: CYSTOSCOPY WITH BIOPSY  INSTILLATION OF MARCAINE AND PYRIDIUM;  Surgeon: Hanley Ben, MD;  Location: [facility];  Service: Urology;  Laterality: N/A;   LAPAROSCOPIC CHOLECYSTECTOMY  11-08-2001   LAPAROSCOPIC LEFT SALPINGOOPHORECTOMY AND LYSYS ADHESIONS  03-10-2002   LAPAROSCOPIC REMOVAL OVARY REMNANT  2003   [place]   LAPAROSCOPY N/A 12/14/2012   Procedure: LAPAROSCOPY DIAGNOSTIC;  Surgeon: Stark Klein, MD;  Location: [facility];  Service: General;  Laterality: N/A;   TOTAL ABDOMINAL HYSTERECTOMY  2000   W/ RIGHT SALPINGOOPHORECTOMY   TRANSTHORACIC ECHOCARDIOGRAM  10-13-2010   NORMAL LVF/  EF 55-60%    Family History  Problem Relation Age of Onset   Sleep apnea Father    Depression Father    Alcohol abuse Father    Hypertension Father    Migraines Sister        headaches   Anxiety disorder Sister    Other Mother        MAC infection   Anxiety  disorder Mother    Dementia Mother        likely Alzheimer's disease; symptom onset on late 70s/early 80s   Heart disease Paternal Grandmother    Hyperlipidemia Paternal Grandmother    Alcohol abuse Paternal Grandfather    Diabetes Neg Hx     Social History   Socioeconomic History   Marital status: Married    Spouse name: Not on file   Number of children: 0   Years of education: 16   Highest education level: Bachelor's degree (e.g., BA, AB, BS)  Occupational History   Occupation: Disability    Comment: Nurse  Tobacco Use   Smoking status: Former Smoker    Packs/day: 0.01    Years: 1.00    Pack years: 0.01    Types: Cigarettes    Quit date: 1980    Years since quitting: 42.4   Smokeless tobacco: Never Used   Tobacco comment: ONLY SMOKED FOR 6 MONTHS --  QUIT  YRS AGO  Vaping Use   Vaping Use: Never used  Substance and Sexual Activity   Alcohol use: Yes    Comment: occ   Drug use: Never   Sexual activity: Not on file  Other Topics Concern   Not on file  Social History Narrative   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    no tobacco   Caffeine Use: very little, three times a week   Left handed    Social Determinants of Health   Financial Resource Strain: Low Risk    Difficulty of Paying Living Expenses: Not hard at all  Food Insecurity: No Food Insecurity   Worried About Charity fundraiser in the Last Year: Never true   Ran Out of Food in the Last Year: Never true  Transportation Needs: No Transportation Needs   Lack of Transportation (Medical): No   Lack of Transportation (Non-Medical): No  Physical Activity: Sufficiently Active   Days of Exercise per Week: 7 days   Minutes of Exercise per Session: 30 min  Stress: No Stress Concern Present   Feeling of Stress : Not at all  Social Connections: Moderately Integrated   Frequency of  Communication with Friends and Family: Once a week   Frequency of Social Gatherings with Friends and Family: Twice a week   Attends Religious Services: 1 to 4 times per year   Active Member of [employer] or Organizations: No   Attends Archivist Meetings: Never   Marital Status: Married  Human resources officer Violence: Not At Risk   Fear of Current or Ex-Partner: No   Emotionally Abused: No   Physically Abused: No   Sexually  Abused: No    Outpatient Medications Prior to Visit  Medication Sig Dispense Refill   albuterol (VENTOLIN HFA) 108 (90 Base) MCG/ACT inhaler Inhale 2 puffs into the lungs every 6 (six) hours as needed for wheezing or shortness of breath. 18 g 1   buPROPion (WELLBUTRIN XL) 150 MG 24 hr tablet Take 450 mg by mouth every morning.     divalproex (DEPAKOTE ER) 250 MG 24 hr tablet Take by mouth.     FENOFIBRATE PO Take by mouth.     fluticasone (FLONASE) 50 MCG/ACT nasal spray Place 2 sprays into both nostrils daily. 16 g 1   Fluticasone-Salmeterol (ADVAIR DISKUS) 250-50 MCG/DOSE AEPB Inhale 1 puff into the lungs 2 (two) times daily. 60 each 3   gabapentin (NEURONTIN) 300 MG capsule Take 300 mg by mouth at bedtime.     hydrOXYzine (ATARAX/VISTARIL) 25 MG tablet Take 1 tablet (25 mg total) by mouth 3 (three) times daily as needed for itching. 30 tablet 0   LORazepam (ATIVAN) 0.5 MG tablet Take 0.5 mg by mouth 2 (two) times daily. Take an extra 0.27m as needed.     meclizine (ANTIVERT) 25 MG tablet Take 1 tablet (25 mg total) by mouth 3 (three) times daily as needed for dizziness. 30 tablet 0   omeprazole (PRILOSEC) 40 MG capsule Take 40 mg by mouth every evening.     ondansetron (ZOFRAN) 4 MG tablet Take 1 tablet (4 mg total) by mouth daily as needed for nausea. 10 tablet 0   potassium chloride SA (KLOR-CON) 20 MEQ tablet Take 1 tablet (20 mEq total) by mouth daily. 30 tablet 2   No facility-administered medications prior to visit.    Allergies  Allergen Reactions    Morphine And Related Nausea And Vomiting and Nausea Only   Ambien [Zolpidem] Other (See Comments)    Per patient this caused her to fall   Duloxetine Swelling and Other (See Comments)    Other reaction(s): SWELLING/EDEMA   Latuda [Lurasidone Hcl]    Morphine Nausea Only    ROS Review of Systems  Constitutional: Negative for appetite change, chills, fever and unexpected weight change.  HENT: Positive for postnasal drip and sore throat. Negative for sinus pressure.   Respiratory: Negative for shortness of breath and wheezing.   Cardiovascular: Negative for chest pain.  Neurological: Negative for headaches.  Hematological: Negative for adenopathy.      Objective:    Physical Exam Vitals reviewed.  Constitutional:      Appearance: She is well-developed.  HENT:     Ears:     Comments: She had some minimal cerumen left canal.  Eardrums appear normal with no acute changes    Mouth/Throat:     Comments: No significant erythema.  No exudate.  No posterior pharynx asymmetry. Neck:     Comments: She does have right neck mass which appears to be about 3 cm x 3 cm in dimension.  This is nontender.  No left-sided masses noted.  No anterior cervical adenopathy. Musculoskeletal:     Cervical back: Neck supple.  Lymphadenopathy:     Cervical: No cervical adenopathy.  Neurological:     Mental Status: She is alert.     BP 124/60 (BP Location: Left Arm, Patient Position: Sitting, Cuff Size: Normal)   Pulse 78   Temp 97.9 F (36.6 C) (Oral)   Wt 152 lb 8 oz (69.2 kg)   SpO2 96%   BMI 29.78 kg/m  Wt Readings from Last 3 Encounters:  01/16/21 152 lb 8 oz (69.2 kg)  10/15/20 154 lb 6.4 oz (70 kg)  08/29/20 149 lb (67.6 kg)     Health Maintenance Due  Topic Date Due   Zoster Vaccines- Shingrix (1 of 2) Never done   TETANUS/TDAP  06/14/2019   MAMMOGRAM  09/19/2019    There are no preventive care reminders to display for this patient.  Lab Results  Component Value Date   TSH  1.42 08/29/2020   Lab Results  Component Value Date   WBC 4.3 08/20/2020   HGB 12.3 08/20/2020   HCT 37.4 08/20/2020   MCV 96.6 08/20/2020   PLT 275 08/20/2020   Lab Results  Component Value Date   NA 139 08/29/2020   K 2.9 (L) 08/29/2020   CO2 26 08/29/2020   GLUCOSE 98 08/29/2020   BUN 10 08/29/2020   CREATININE 1.03 08/29/2020   BILITOT 1.0 08/17/2020   ALKPHOS 91 08/17/2020   AST 30 08/17/2020   ALT 43 08/17/2020   PROT 7.8 08/17/2020   ALBUMIN 4.3 08/17/2020   CALCIUM 10.6 (H) 08/29/2020   ANIONGAP 12 08/20/2020   GFR 59.87 (L) 08/29/2020   Lab Results  Component Value Date   CHOL 193 11/28/2019   Lab Results  Component Value Date   HDL 43.40 11/28/2019   Lab Results  Component Value Date   LDLCALC 54 05/15/2017   Lab Results  Component Value Date   TRIG 334.0 (H) 11/28/2019   Lab Results  Component Value Date   CHOLHDL 4 11/28/2019   Lab Results  Component Value Date   HGBA1C 5.0 08/29/2020      Assessment & Plan:   Problem List Items Addressed This Visit   None    Visit Diagnoses     Sore throat    -  Primary   Relevant Orders   POCT rapid strep A (Completed)   Thyroid mass of unclear etiology       Relevant Orders   US THYROID     She has persistent sore throat for the past couple weeks.  Question postnasal drip related.  COVID test negative.  Rapid strep today in office negative.  -Recommend over-the-counter antihistamine such as Claritin or Zyrtec  She has right thyroid mass which by ultrasound 2018 had shown some growth in 2016.  Indeterminate biopsy 2016.  Positive family history reported of thyroid cancer in father.  Deserves further evaluation.  Recommend follow-up ultrasound.  If this has grown or changed since 2018 we will likely recommend repeat biopsy  No orders of the defined types were placed in this encounter.  Addendum 01-25-21: Ultrasound does show interval growth of right thyroid mass.   Setting up referral to  interventional radiology for bx.  Follow-up: No follow-ups on file.    Carolann Littler, MD

## 2021-01-20 ENCOUNTER — Other Ambulatory Visit: Payer: Self-pay | Admitting: Family Medicine

## 2021-01-21 ENCOUNTER — Other Ambulatory Visit: Payer: Self-pay

## 2021-01-21 DIAGNOSIS — E876 Hypokalemia: Secondary | ICD-10-CM

## 2021-01-25 ENCOUNTER — Ambulatory Visit
Admission: RE | Admit: 2021-01-25 | Discharge: 2021-01-25 | Disposition: A | Discharge status: Home / Self Care | Payer: Medicare Other | Source: Ambulatory Visit | Attending: Family Medicine | Admitting: Family Medicine

## 2021-01-25 ENCOUNTER — Encounter: Payer: Self-pay | Admitting: Family Medicine

## 2021-01-25 DIAGNOSIS — E041 Nontoxic single thyroid nodule: Secondary | ICD-10-CM | POA: Diagnosis not present

## 2021-01-25 DIAGNOSIS — E0789 Other specified disorders of thyroid: Secondary | ICD-10-CM

## 2021-01-25 NOTE — Addendum Note (Signed)
Addended by: Eulas Post on: 01/25/2021 09:42 PM   Modules accepted: Orders

## 2021-01-28 NOTE — Progress Notes (Signed)
Pt has been contacted via mychart. Please see encounter from 01/25/2021 for further documentation.

## 2021-01-30 ENCOUNTER — Other Ambulatory Visit: Payer: Self-pay | Admitting: Family Medicine

## 2021-01-30 DIAGNOSIS — E0789 Other specified disorders of thyroid: Secondary | ICD-10-CM

## 2021-02-06 DIAGNOSIS — F29 Unspecified psychosis not due to a substance or known physiological condition: Secondary | ICD-10-CM | POA: Diagnosis not present

## 2021-02-07 ENCOUNTER — Telehealth: Payer: Self-pay | Admitting: Family Medicine

## 2021-02-07 ENCOUNTER — Ambulatory Visit
Admission: RE | Admit: 2021-02-07 | Discharge: 2021-02-07 | Disposition: A | Discharge status: Home / Self Care | Payer: Medicare Other | Source: Ambulatory Visit | Attending: Family Medicine | Admitting: Family Medicine

## 2021-02-07 ENCOUNTER — Other Ambulatory Visit (HOSPITAL_COMMUNITY)
Admission: RE | Admit: 2021-02-07 | Discharge: 2021-02-07 | Disposition: A | Discharge status: Home / Self Care | Payer: Medicare Other | Source: Ambulatory Visit | Attending: Family Medicine | Admitting: Family Medicine

## 2021-02-07 DIAGNOSIS — E0789 Other specified disorders of thyroid: Secondary | ICD-10-CM | POA: Diagnosis not present

## 2021-02-07 DIAGNOSIS — E041 Nontoxic single thyroid nodule: Secondary | ICD-10-CM | POA: Diagnosis not present

## 2021-02-07 NOTE — Telephone Encounter (Signed)
The patient had a US Thyroid 06/23 and wants to discuss with Dr. Elease Hashimoto to have her thyroid removed.  Please advise

## 2021-02-08 ENCOUNTER — Encounter: Payer: Self-pay | Admitting: Family Medicine

## 2021-02-08 LAB — CYTOLOGY - NON PAP

## 2021-02-08 NOTE — Telephone Encounter (Signed)
Please advise. I am not familiar with the doctor. Could this be for the imaging of her thyroid/

## 2021-02-08 NOTE — Telephone Encounter (Signed)
Left a detailed message on verified voice mail asking that the patient schedule an appointment to discuss this further with Dr. Elease Hashimoto.

## 2021-02-08 NOTE — Telephone Encounter (Signed)
Pt is calling back stating that she listen to her voice mail and was wondering who Dr. Candise Che is and why would she have to make an appointment to see that doctor.  She is aware that we do not have a Dr. Tessa Lerner in our office but wanted a msg sent back for the CMA to give her a call back.

## 2021-02-08 NOTE — Telephone Encounter (Signed)
Pt called and has been scheduled

## 2021-02-11 NOTE — Telephone Encounter (Signed)
Please arrange a f/u appt to address all her concerns. Thanks, BJ

## 2021-02-11 NOTE — Telephone Encounter (Signed)
Left message for patient to call back. Dr. Tessa Lerner is with [facility] where the imaging of her thyroid will be done.

## 2021-02-11 NOTE — Telephone Encounter (Signed)
Spoke with the patient. She sated she has already had her imaging done so this not the dr. Doing her imaging. I explained to the patient that we do not have anyone by that name here in our office and I do not see any recent orders that this may pertain to. She stated she would call the number back and see if she could get answerers that way. Nothing further needed at this time.

## 2021-02-11 NOTE — Telephone Encounter (Signed)
Patient returned call stating she got a message stating she needs to make an appt with Dr. Naaman Plummer, however she doesn't know who that is.  She is requesting a call back--stated it was left on her phone by Dr. Erick Blinks CMA.

## 2021-02-12 NOTE — Telephone Encounter (Signed)
Pt called and has been scheduled with Dr. [name] to discuss this through a telephone visit.

## 2021-02-12 NOTE — Telephone Encounter (Signed)
Pt is scheduled for a telephone visit with pcp tomorrow.

## 2021-02-13 ENCOUNTER — Encounter: Payer: Self-pay | Admitting: Family Medicine

## 2021-02-13 ENCOUNTER — Telehealth (INDEPENDENT_AMBULATORY_CARE_PROVIDER_SITE_OTHER): Payer: Medicare Other | Admitting: Family Medicine

## 2021-02-13 VITALS — Ht 60.0 in

## 2021-02-13 DIAGNOSIS — E0789 Other specified disorders of thyroid: Secondary | ICD-10-CM | POA: Diagnosis not present

## 2021-02-13 DIAGNOSIS — E559 Vitamin D deficiency, unspecified: Secondary | ICD-10-CM | POA: Diagnosis not present

## 2021-02-13 DIAGNOSIS — Z5181 Encounter for therapeutic drug level monitoring: Secondary | ICD-10-CM

## 2021-02-13 DIAGNOSIS — E876 Hypokalemia: Secondary | ICD-10-CM

## 2021-02-13 DIAGNOSIS — E538 Deficiency of other specified B group vitamins: Secondary | ICD-10-CM | POA: Diagnosis not present

## 2021-02-13 DIAGNOSIS — E782 Mixed hyperlipidemia: Secondary | ICD-10-CM

## 2021-02-13 DIAGNOSIS — F250 Schizoaffective disorder, bipolar type: Secondary | ICD-10-CM

## 2021-02-13 NOTE — Progress Notes (Signed)
Virtual Visit via Video Note I connected with [name] 02/13/21 by a video enabled telemedicine application and verified that I am speaking with the correct person using two identifiers.  Location patient: home Location provider:work office Persons participating in the virtual visit: patient, provider  I discussed the limitations of evaluation and management by telemedicine and the availability of in person appointments. The patient expressed understanding and agreed to proceed.  Chief Complaint  Patient presents with   Results   HPI: [name] is a 60 yo female with hx of schizophrenia bipolar disorder, thyroid nodule, chronic fatigue, vitamin D deficiency, and hyperlipidemia concerned about growing thyroid nodule. She was recently evaluated because of sore throat, thyroid mass was noted, thyroid US was ordered. 16 2022 thyroid RU:[id number] biopsied right thyroid nodule demonstrates significant interval growth. Repeat FNA of nodule 2 should be performed. Prior thyroid US in 2016 and 2018. [place] FNA biopsy of the thyroid in 03/2015.  She was referred to interventional radiology to repeat thyroid nodule biopsy. 02/07/2021 pathology reported follicular lesion of undetermined significance (Bethesda category III).  According to patient son for has been sent to [place] for a second cytology analysis, result is still pending.This is exacerbating anxiety.  She is reporting family history of thyroid cancer, states that her father had a "very aggressive thyroid cancer."  She is interested in having thyroid nodule removed regardless of pathology report.  She thinks thyroid nodule is being a contributing factor for some of the symptoms she has had for a while. Feeling "weak", she has been evaluated by neurologist and according to pr, work up was negative for a serious neurologic condition.  States that she was referred for a neuropsychological evaluation and "memory test" was abnormal, not sure  about etiology,likely multifactorial.  Gait has improved and she is not longer falling after lithium was discontinued. She follows with psychiatry regularly, she was started on Depakote. Sore throat and difficulty swallowing, she has follow-up with ENT. Difficulty swallowing solids and liquids, is stable.  GERD on omeprazole 40 mg daily. She is not having heartburn,acid reflux, or cough.  She also would like to have some blood work done, including lipid panel and Depakote level. Hypokalemia: She is no longer on potassium supplementation. We were supposed to recheck potassium a few weeks after K-Lor was started.  Lab Results  Component Value Date   CREATININE 1.03 08/29/2020   BUN 10 08/29/2020   NA 139 08/29/2020   K 2.9 (L) 08/29/2020   CL 107 08/29/2020   CO2 26 08/29/2020   Mildly elevated Ca++  on 08/29/20. Negative for abdominal pain, changes in bowel habits, nausea, or vomiting.  Vit D deficiency and B12 deficiency: She is not on supplementation.  Lab Results  Component Value Date   VITAMINB12 240 08/29/2020   HLD: She is no longer on fenofibrate.  Lab Results  Component Value Date   CHOL 193 11/28/2019   HDL 43.40 11/28/2019   LDLCALC 54 05/15/2017   LDLDIRECT 107.0 11/28/2019   TRIG 334.0 (H) 11/28/2019   CHOLHDL 4 11/28/2019   ROS: See pertinent positives and negatives per HPI.  Past Medical History:  Diagnosis Date   Allergic rhinitis 03/26/2007   Autonomic dysfunction 01/11/2015   Bipolar 1 disorder    Hx of psychosis; present since age 9   Chronic fatigue 06/21/2017   Community acquired pneumonia of right lung 14/78/2956   Complication of anesthesia    agitation   Costochondritis 02/14/2014   Diverticular disease of colon  08/06/2020   Double vision 03/15/2011   Dyspnea on effort 01/10/2011   Dysuria 02/15/2013   Elevated lactic acid level    Endometriosis 05/11/2008   Fatty liver disease, nonalcoholic 27/02/8674   Frequency of urination     Gastroesophageal reflux disease 08/06/2020   Headache    History of duodenal ulcer    History of lithium toxicity 05/01/2014   Hyperglycemia 01/10/2011   Hyperlipidemia, mixed 05/12/2017   Hyperprolactinemia 06/26/2015   Insomnia 05/12/2017   Irritable bowel syndrome 08/06/2020   Jaw pain 02/13/2012   poss tmj area     Leukocytosis 05/01/2014   Multinodular goiter 05/12/2017   Nocturia    Nondiabetic gastroparesis 01/11/2015   Pelvic pain    Plantar fasciitis 06/11/2007   Pneumonia 08/20/2020   Pruritus ani 08/06/2020   Renal stone 02/13/2014   Schizoaffective disorder, bipolar type    Unclear if this is not better accounted for by longstanding history of bipolar disorder   Social anxiety disorder    Subacute bronchitis 07/10/2018   Thyroid nodule 02/02/2017   Urgency of urination    Vitamin D deficiency 09/23/2011    Past Surgical History:  Procedure Laterality Date   CARDIAC CATHETERIZATION  10-09-1999  DR KATZ   NORMAL LVF/  NORMAL RCA/  NO CRITICAL DISEASE LEFT CORONARY SYSTEM   CARDIOVASCULAR STRESS TEST  01-23-2011   NORMAL NUCLEAR STUDY/ NO ISCHEMIA/ EF 81%   CYSTOSCOPY WITH BIOPSY N/A 06/03/2013   Procedure: CYSTOSCOPY WITH BIOPSY  INSTILLATION OF MARCAINE AND PYRIDIUM;  Surgeon: Hanley Ben, MD;  Location: [facility];  Service: Urology;  Laterality: N/A;   LAPAROSCOPIC CHOLECYSTECTOMY  11-08-2001   LAPAROSCOPIC LEFT SALPINGOOPHORECTOMY AND LYSYS ADHESIONS  03-10-2002   LAPAROSCOPIC REMOVAL OVARY REMNANT  2003   [place]   LAPAROSCOPY N/A 12/14/2012   Procedure: LAPAROSCOPY DIAGNOSTIC;  Surgeon: Stark Klein, MD;  Location: [facility];  Service: General;  Laterality: N/A;   TOTAL ABDOMINAL HYSTERECTOMY  2000   W/ RIGHT SALPINGOOPHORECTOMY   TRANSTHORACIC ECHOCARDIOGRAM  10-13-2010   NORMAL LVF/  EF 55-60%    Family History  Problem Relation Age of Onset   Sleep apnea Father    Depression Father    Alcohol abuse Father    Hypertension Father     Migraines Sister        headaches   Anxiety disorder Sister    Other Mother        MAC infection   Anxiety disorder Mother    Dementia Mother        likely Alzheimer's disease; symptom onset on late 70s/early 80s   Heart disease Paternal Grandmother    Hyperlipidemia Paternal Grandmother    Alcohol abuse Paternal Grandfather    Diabetes Neg Hx     Social History   Socioeconomic History   Marital status: Married    Spouse name: Not on file   Number of children: 0   Years of education: 16   Highest education level: Bachelor's degree (e.g., BA, AB, BS)  Occupational History   Occupation: Disability    Comment: Nurse  Tobacco Use   Smoking status: Former    Packs/day: 0.01    Years: 1.00    Pack years: 0.01    Types: Cigarettes    Quit date: 1980    Years since quitting: 42.5   Smokeless tobacco: Never   Tobacco comments:    ONLY SMOKED FOR 6 MONTHS --  QUIT  YRS AGO  Vaping Use  Vaping Use: Never used  Substance and Sexual Activity   Alcohol use: Yes    Comment: occ   Drug use: Never   Sexual activity: Not on file  Other Topics Concern   Not on file  Social History Narrative   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    no tobacco   Caffeine Use: very little, three times a week   Left handed    Social Determinants of Health   Financial Resource Strain: Low Risk    Difficulty of Paying Living Expenses: Not hard at all  Food Insecurity: No Food Insecurity   Worried About Charity fundraiser in the Last Year: Never true   Ran Out of Food in the Last Year: Never true  Transportation Needs: No Transportation Needs   Lack of Transportation (Medical): No   Lack of Transportation (Non-Medical): No  Physical Activity: Sufficiently Active   Days of Exercise per Week: 7 days   Minutes of Exercise per Session: 30 min  Stress: No Stress  Concern Present   Feeling of Stress : Not at all  Social Connections: Moderately Integrated   Frequency of Communication with Friends and Family: Once a week   Frequency of Social Gatherings with Friends and Family: Twice a week   Attends Religious Services: 1 to 4 times per year   Active Member of [employer] or Organizations: No   Attends Music therapist: Never   Marital Status: Married  Human resources officer Violence: Not At Risk   Fear of Current or Ex-Partner: No   Emotionally Abused: No   Physically Abused: No   Sexually Abused: No    Current Outpatient Medications:    albuterol (VENTOLIN HFA) 108 (90 Base) MCG/ACT inhaler, Inhale 2 puffs into the lungs every 6 (six) hours as needed for wheezing or shortness of breath., Disp: 18 g, Rfl: 1   buPROPion (WELLBUTRIN XL) 150 MG 24 hr tablet, Take 450 mg by mouth every morning., Disp: , Rfl:    divalproex (DEPAKOTE ER) 250 MG 24 hr tablet, Take by mouth., Disp: , Rfl:    FENOFIBRATE PO, Take by mouth., Disp: , Rfl:    fluticasone (FLONASE) 50 MCG/ACT nasal spray, Place 2 sprays into both nostrils daily., Disp: 16 g, Rfl: 1   Fluticasone-Salmeterol (ADVAIR DISKUS) 250-50 MCG/DOSE AEPB, Inhale 1 puff into the lungs 2 (two) times daily., Disp: 60 each, Rfl: 3   gabapentin (NEURONTIN) 300 MG capsule, Take 300 mg by mouth at bedtime., Disp: , Rfl:    hydrOXYzine (ATARAX/VISTARIL) 25 MG tablet, Take 1 tablet (25 mg total) by mouth 3 (three) times daily as needed for itching., Disp: 30 tablet, Rfl: 0   LORazepam (ATIVAN) 0.5 MG tablet, Take 0.5 mg by mouth 2 (two) times daily. Take an extra 0.53m as needed., Disp: , Rfl:    meclizine (ANTIVERT) 25 MG tablet, Take 1 tablet (25 mg total) by mouth 3 (three) times daily as needed for dizziness., Disp: 30 tablet, Rfl: 0   omeprazole (PRILOSEC) 40 MG capsule, Take 40 mg by mouth every evening., Disp: , Rfl:    ondansetron (ZOFRAN) 4 MG tablet, Take 1 tablet (4 mg total) by mouth daily as needed for  nausea., Disp: 10 tablet, Rfl: 0   potassium chloride SA (KLOR-CON) 20 MEQ tablet, Take 1 tablet (20 mEq total) by mouth daily., Disp: 30  tablet, Rfl: 2  EXAM:  VITALS per patient if applicable:Ht 5' (1.779 m)   BMI 29.78 kg/m   GENERAL: alert, appears well and in no acute distress  HEENT: atraumatic, conjunctiva clear, no obvious abnormalities on inspection.  NECK: normal movements of the head and neck. I do not appreciate neck masses on inspection.  LUNGS: on inspection no signs of respiratory distress, breathing rate appears normal, no obvious gross SOB, gasping or wheezing  CV: no obvious cyanosis  MS: moves all visible extremities without noticeable abnormality  PSYCH/NEURO: pleasant and cooperative, no obvious depression, + anxious.Speech and thought processing grossly intact  ASSESSMENT AND PLAN:  Discussed the following assessment and plan: Orders Placed This Encounter  Procedures   Basic metabolic panel   Lipid panel   Vitamin B12   VITAMIN D 25 Hydroxy (Vit-D Deficiency, Fractures)   Parathyroid hormone, intact (no Ca)   Calcium, ionized   Valproic Acid Level   Ambulatory referral to General Surgery   Thyroid mass of unclear etiology - Plan: Ambulatory referral to General Surgery She is very concerned about this. She is not interested in endocrinology referral, she would like thyroid nodule removed regardless of pathology report. Surgery referral placed.  Hypercalcemia - Plan: VITAMIN D 25 Hydroxy (Vit-D Deficiency, Fractures), Parathyroid hormone, intact (no Ca), Calcium, ionized We discussed possible etiologies. Further recommendation will be given according to lab results.  Vitamin D deficiency - Plan: VITAMIN D 25 Hydroxy (Vit-D Deficiency, Fractures) For now continue with no supplementation, recommendations will be given according to 25 OH vitamin D result.  Hyperlipidemia, mixed - Plan: Lipid panel Continue nonpharmacologic treatment. Further  recommendations will be given according to 10-year CVD risk and lipid panel results.  B12 deficiency - Plan: Vitamin B12 She is not on B12 supplementation, will give recommendations in this regard according to B12 results.  Hypokalemia - Plan: Basic metabolic panel She is no longer on K-Lor supplementation. Continue potassium rich diet.  Schizoaffective disorder, bipolar type ([place]) - Plan: Valproic Acid Level Following with psychiatrist. Valproic acid level ordered as requested, result will be faxed to her psychiatrist.  Encounter for medication monitoring - Plan: Valproic Acid Level  We discussed possible serious and likely etiologies, options for evaluation and workup, limitations of telemedicine visit vs in person visit, treatment, treatment risks and precautions.  I discussed the assessment and treatment plan with the patient. Ms. Furlan was provided an opportunity to ask questions and all were answered. She agreed with the plan and demonstrated an understanding of the instructions.  Spent 40 minutes. During this time history was obtained and documented, examination was performed, prior labs/imaging reviewed, and assessment/plan discussed.  Return for Lab on 02/15/21.[name] Kitchen  Jonerik Sliker [name], MD

## 2021-02-15 ENCOUNTER — Telehealth (INDEPENDENT_AMBULATORY_CARE_PROVIDER_SITE_OTHER): Payer: Medicare Other | Admitting: Family Medicine

## 2021-02-15 ENCOUNTER — Other Ambulatory Visit: Payer: Self-pay

## 2021-02-15 DIAGNOSIS — E559 Vitamin D deficiency, unspecified: Secondary | ICD-10-CM

## 2021-02-15 DIAGNOSIS — E782 Mixed hyperlipidemia: Secondary | ICD-10-CM

## 2021-02-15 DIAGNOSIS — Z5181 Encounter for therapeutic drug level monitoring: Secondary | ICD-10-CM

## 2021-02-15 DIAGNOSIS — E041 Nontoxic single thyroid nodule: Secondary | ICD-10-CM | POA: Diagnosis not present

## 2021-02-15 DIAGNOSIS — E538 Deficiency of other specified B group vitamins: Secondary | ICD-10-CM

## 2021-02-15 DIAGNOSIS — F250 Schizoaffective disorder, bipolar type: Secondary | ICD-10-CM | POA: Diagnosis not present

## 2021-02-15 DIAGNOSIS — E876 Hypokalemia: Secondary | ICD-10-CM

## 2021-02-15 LAB — BASIC METABOLIC PANEL
BUN: 17 mg/dL (ref 6–23)
CO2: 22 mEq/L (ref 19–32)
Calcium: 9.6 mg/dL (ref 8.4–10.5)
Chloride: 108 mEq/L (ref 96–112)
Creatinine, Ser: 0.83 mg/dL (ref 0.40–1.20)
GFR: 77.32 mL/min (ref >60.00)
Glucose, Bld: 130 mg/dL — ABNORMAL HIGH (ref 70–99)
Potassium: 3.9 mEq/L (ref 3.5–5.1)
Sodium: 141 mEq/L (ref 135–145)

## 2021-02-15 LAB — VITAMIN D 25 HYDROXY (VIT D DEFICIENCY, FRACTURES): VITD: 12.07 ng/mL — ABNORMAL LOW (ref 30.00–100.00)

## 2021-02-15 LAB — VITAMIN B12: Vitamin B-12: 358 pg/mL (ref 211–911)

## 2021-02-15 LAB — LIPID PANEL
Cholesterol: 175 mg/dL (ref 0–200)
HDL: 37.4 mg/dL — ABNORMAL LOW (ref >39.00)
NonHDL: 137.63
Total CHOL/HDL Ratio: 5
Triglycerides: 269 mg/dL — ABNORMAL HIGH (ref 0.0–149.0)
VLDL: 53.8 mg/dL — ABNORMAL HIGH (ref 0.0–40.0)

## 2021-02-15 LAB — LDL CHOLESTEROL, DIRECT: Direct LDL: 104 mg/dL

## 2021-02-15 NOTE — Progress Notes (Signed)
Patient ID: [name], female   DOB: Apr 30, 1961, 60 y.o.   MRN: [id number]   This visit type was conducted due to national recommendations for restrictions regarding the COVID-19 pandemic in an effort to limit this patient's exposure and mitigate transmission in our community.   Virtual Visit via Video Note  I connected with [name] on 02/15/21 at  8:30 AM EDT by a video enabled telemedicine application and verified that I am speaking with the correct person using two identifiers.  Location patient: home Location provider:work or home office Persons participating in the virtual visit: patient, provider  I discussed the limitations of evaluation and management by telemedicine and the availability of in person appointments. The patient expressed understanding and agreed to proceed.   HPI: [name] had been seen recently by me as a work in we had noted thyroid nodule on exam.  She was sent for ultrasound which showed some growth compared to 2016.  She had fine-needle aspiration which was inconclusive.  Specimen was sent for Afirma testing.  Still pending at this time.  Patient actually had follow-up with her primary by a virtual visit just couple days ago.  Refer to Dr. Doug Sou detailed note.  This outlines her history very well.  Patient is very anxious because her father died of some type of aggressive thyroid cancer at age 7.  Referral has been made to general surgeon.  Dr. [name] also ordered several endocrine test which she plans to get later today   ROS: See pertinent positives and negatives per HPI.  Past Medical History:  Diagnosis Date   Allergic rhinitis 03/26/2007   Autonomic dysfunction 01/11/2015   Bipolar 1 disorder    Hx of psychosis; present since age 71   Chronic fatigue 06/21/2017   Community acquired pneumonia of right lung 67/61/9509   Complication of anesthesia    agitation   Costochondritis 02/14/2014   Diverticular disease of colon 08/06/2020    Double vision 03/15/2011   Dyspnea on effort 01/10/2011   Dysuria 02/15/2013   Elevated lactic acid level    Endometriosis 05/11/2008   Fatty liver disease, nonalcoholic 32/67/1245   Frequency of urination    Gastroesophageal reflux disease 08/06/2020   Headache    History of duodenal ulcer    History of lithium toxicity 05/01/2014   Hyperglycemia 01/10/2011   Hyperlipidemia, mixed 05/12/2017   Hyperprolactinemia 06/26/2015   Insomnia 05/12/2017   Irritable bowel syndrome 08/06/2020   Jaw pain 02/13/2012   poss tmj area     Leukocytosis 05/01/2014   Multinodular goiter 05/12/2017   Nocturia    Nondiabetic gastroparesis 01/11/2015   Pelvic pain    Plantar fasciitis 06/11/2007   Pneumonia 08/20/2020   Pruritus ani 08/06/2020   Renal stone 02/13/2014   Schizoaffective disorder, bipolar type    Unclear if this is not better accounted for by longstanding history of bipolar disorder   Social anxiety disorder    Subacute bronchitis 07/10/2018   Thyroid nodule 02/02/2017   Urgency of urination    Vitamin D deficiency 09/23/2011    Past Surgical History:  Procedure Laterality Date   CARDIAC CATHETERIZATION  10-09-1999  DR KATZ   NORMAL LVF/  NORMAL RCA/  NO CRITICAL DISEASE LEFT CORONARY SYSTEM   CARDIOVASCULAR STRESS TEST  01-23-2011   NORMAL NUCLEAR STUDY/ NO ISCHEMIA/ EF 81%   CYSTOSCOPY WITH BIOPSY N/A 06/03/2013   Procedure: CYSTOSCOPY WITH BIOPSY  INSTILLATION OF MARCAINE AND PYRIDIUM;  Surgeon: Hanley Ben, MD;  Location: [facility];  Service: Urology;  Laterality: N/A;   LAPAROSCOPIC CHOLECYSTECTOMY  11-08-2001   LAPAROSCOPIC LEFT SALPINGOOPHORECTOMY AND LYSYS ADHESIONS  03-10-2002   LAPAROSCOPIC REMOVAL OVARY REMNANT  2003   [place]   LAPAROSCOPY N/A 12/14/2012   Procedure: LAPAROSCOPY DIAGNOSTIC;  Surgeon: Stark Klein, MD;  Location: [facility];  Service: General;  Laterality: N/A;   TOTAL ABDOMINAL HYSTERECTOMY  2000   W/ RIGHT SALPINGOOPHORECTOMY    TRANSTHORACIC ECHOCARDIOGRAM  10-13-2010   NORMAL LVF/  EF 55-60%    Family History  Problem Relation Age of Onset   Sleep apnea Father    Depression Father    Alcohol abuse Father    Hypertension Father    Migraines Sister        headaches   Anxiety disorder Sister    Other Mother        MAC infection   Anxiety disorder Mother    Dementia Mother        likely Alzheimer's disease; symptom onset on late 70s/early 80s   Heart disease Paternal Grandmother    Hyperlipidemia Paternal Grandmother    Alcohol abuse Paternal Grandfather    Diabetes Neg Hx     SOCIAL HX: former smoker .   Quit around 1980.     Current Outpatient Medications:    albuterol (VENTOLIN HFA) 108 (90 Base) MCG/ACT inhaler, Inhale 2 puffs into the lungs every 6 (six) hours as needed for wheezing or shortness of breath., Disp: 18 g, Rfl: 1   buPROPion (WELLBUTRIN XL) 150 MG 24 hr tablet, Take 450 mg by mouth every morning., Disp: , Rfl:    divalproex (DEPAKOTE ER) 250 MG 24 hr tablet, Take by mouth., Disp: , Rfl:    FENOFIBRATE PO, Take by mouth., Disp: , Rfl:    fluticasone (FLONASE) 50 MCG/ACT nasal spray, Place 2 sprays into both nostrils daily., Disp: 16 g, Rfl: 1   Fluticasone-Salmeterol (ADVAIR DISKUS) 250-50 MCG/DOSE AEPB, Inhale 1 puff into the lungs 2 (two) times daily., Disp: 60 each, Rfl: 3   gabapentin (NEURONTIN) 300 MG capsule, Take 300 mg by mouth at bedtime., Disp: , Rfl:    hydrOXYzine (ATARAX/VISTARIL) 25 MG tablet, Take 1 tablet (25 mg total) by mouth 3 (three) times daily as needed for itching., Disp: 30 tablet, Rfl: 0   LORazepam (ATIVAN) 0.5 MG tablet, Take 0.5 mg by mouth 2 (two) times daily. Take an extra 0.78m as needed., Disp: , Rfl:    meclizine (ANTIVERT) 25 MG tablet, Take 1 tablet (25 mg total) by mouth 3 (three) times daily as needed for dizziness., Disp: 30 tablet, Rfl: 0   omeprazole (PRILOSEC) 40 MG capsule, Take 40 mg by mouth every evening., Disp: , Rfl:    ondansetron  (ZOFRAN) 4 MG tablet, Take 1 tablet (4 mg total) by mouth daily as needed for nausea., Disp: 10 tablet, Rfl: 0   potassium chloride SA (KLOR-CON) 20 MEQ tablet, Take 1 tablet (20 mEq total) by mouth daily., Disp: 30 tablet, Rfl: 2  EXAM:  VITALS per patient if applicable:  GENERAL: alert, oriented, appears well and in no acute distress  HEENT: atraumatic, conjunttiva clear, no obvious abnormalities on inspection of external nose and ears  NECK: normal movements of the head and neck  LUNGS: on inspection no signs of respiratory distress, breathing rate appears normal, no obvious gross SOB, gasping or wheezing  CV: no obvious cyanosis  MS: moves all visible extremities without noticeable abnormality  PSYCH/NEURO: pleasant and cooperative, no  obvious depression or anxiety, speech and thought processing grossly intact  ASSESSMENT AND PLAN:  Discussed the following assessment and plan:   Inconclusive thyroid mass by recent bx.  Family history of thyroid cancer in patient's father.  Afirma testing is pending  -We have instructed patient to notify us if she is not heard from general surgery office within the next week    I discussed the assessment and treatment plan with the patient. The patient was provided an opportunity to ask questions and all were answered. The patient agreed with the plan and demonstrated an understanding of the instructions.   The patient was advised to call back or seek an in-person evaluation if the symptoms worsen or if the condition fails to improve as anticipated.     Carolann Littler, MD

## 2021-02-17 LAB — VALPROIC ACID LEVEL: Valproic Acid Lvl: 57.2 mg/L (ref 50.0–100.0)

## 2021-02-17 LAB — CALCIUM, IONIZED: Calcium, Ion: 5.37 mg/dL (ref 4.8–5.6)

## 2021-02-17 LAB — PARATHYROID HORMONE, INTACT (NO CA): PTH: 42 pg/mL (ref 16–77)

## 2021-02-20 DIAGNOSIS — F3131 Bipolar disorder, current episode depressed, mild: Secondary | ICD-10-CM | POA: Diagnosis not present

## 2021-02-22 ENCOUNTER — Telehealth: Payer: Self-pay | Admitting: Family Medicine

## 2021-02-22 NOTE — Telephone Encounter (Signed)
Please advise 

## 2021-02-22 NOTE — Telephone Encounter (Signed)
Spoke with the patient. She is aware that we have not received these results back yet and that I will contact her once we have.

## 2021-02-22 NOTE — Telephone Encounter (Signed)
Patient calling wants to know if Dr. [name] or Dr. Elease Hashimoto got the results back from the biopsy that she had done and got the results but was sent off to another lab in [place] as well. Please call [phone]

## 2021-03-01 ENCOUNTER — Telehealth: Payer: Self-pay | Admitting: Family Medicine

## 2021-03-01 NOTE — Telephone Encounter (Signed)
I called and spoke with patient. Biopsy results were received, nodule is benign, which suggests a low risk of cancer at approximately 4%. Patient requested a return call from Dr. Elease Hashimoto.

## 2021-03-01 NOTE — Telephone Encounter (Signed)
The patients wife called to see if Dr. [name] has received the results back on the biopsy that was done 02/07/2021.   Please advise

## 2021-03-01 NOTE — Telephone Encounter (Signed)
These should be in your bin to be reviewed, I think they came in yesterday.

## 2021-03-03 NOTE — Telephone Encounter (Signed)
I spoke with patient.  I have not seen Afirma results but apparently they were sent to Dr [name].  Pt is still interested in consulting with surgeon and referral has already been made as per Dr [name].

## 2021-03-05 ENCOUNTER — Encounter (HOSPITAL_COMMUNITY): Payer: Self-pay

## 2021-03-27 ENCOUNTER — Other Ambulatory Visit: Payer: Self-pay

## 2021-03-27 ENCOUNTER — Telehealth (INDEPENDENT_AMBULATORY_CARE_PROVIDER_SITE_OTHER): Payer: Medicare Other | Admitting: Family Medicine

## 2021-03-27 DIAGNOSIS — U071 COVID-19: Secondary | ICD-10-CM

## 2021-03-27 MED ORDER — MOLNUPIRAVIR EUA 200MG CAPSULE: 4.0000 | ORAL_CAPSULE | Freq: Two times a day (BID) | ORAL | 0 refills | Status: AC

## 2021-03-27 NOTE — Progress Notes (Signed)
Patient ID: [name], female   DOB: 02-05-1961, 60 y.o.   MRN: [id number]   This visit type was conducted due to national recommendations for restrictions regarding the COVID-19 pandemic in an effort to limit this patient's exposure and mitigate transmission in our community.   Virtual Visit via Video Note  I connected with [name] on 03/27/21 at  3:45 PM EDT by a video enabled telemedicine application and verified that I am speaking with the correct person using two identifiers.  Location patient: home Location provider:work or home office Persons participating in the virtual visit: patient, provider  I discussed the limitations of evaluation and management by telemedicine and the availability of in person appointments. The patient expressed understanding and agreed to proceed.   HPI:  [name] has COVID-19 infection.  She states she developed symptoms Monday night of some myalgia, cough, nasal congestion, and mild diarrhea symptoms.  She thinks she may been exposed to someone with COVID last Friday.  She tested positive yesterday.  She had pulse oximeter reading earlier today 92%.  No respiratory distress at rest.  She specifically would like to consider antiviral therapy.  Generally fairly healthy.  She does not have any chronic lung issues or any chronic heart problems.   ROS: See pertinent positives and negatives per HPI.  Past Medical History:  Diagnosis Date   Allergic rhinitis 03/26/2007   Autonomic dysfunction 01/11/2015   Bipolar 1 disorder    Hx of psychosis; present since age 37   Chronic fatigue 06/21/2017   Community acquired pneumonia of right lung 96/78/9381   Complication of anesthesia    agitation   Costochondritis 02/14/2014   Diverticular disease of colon 08/06/2020   Double vision 03/15/2011   Dyspnea on effort 01/10/2011   Dysuria 02/15/2013   Elevated lactic acid level    Endometriosis 05/11/2008   Fatty liver disease, nonalcoholic  01/75/1025   Frequency of urination    Gastroesophageal reflux disease 08/06/2020   Headache    History of duodenal ulcer    History of lithium toxicity 05/01/2014   Hyperglycemia 01/10/2011   Hyperlipidemia, mixed 05/12/2017   Hyperprolactinemia 06/26/2015   Insomnia 05/12/2017   Irritable bowel syndrome 08/06/2020   Jaw pain 02/13/2012   poss tmj area     Leukocytosis 05/01/2014   Multinodular goiter 05/12/2017   Nocturia    Nondiabetic gastroparesis 01/11/2015   Pelvic pain    Plantar fasciitis 06/11/2007   Pneumonia 08/20/2020   Pruritus ani 08/06/2020   Renal stone 02/13/2014   Schizoaffective disorder, bipolar type    Unclear if this is not better accounted for by longstanding history of bipolar disorder   Social anxiety disorder    Subacute bronchitis 07/10/2018   Thyroid nodule 02/02/2017   Urgency of urination    Vitamin D deficiency 09/23/2011    Past Surgical History:  Procedure Laterality Date   CARDIAC CATHETERIZATION  10-09-1999  DR KATZ   NORMAL LVF/  NORMAL RCA/  NO CRITICAL DISEASE LEFT CORONARY SYSTEM   CARDIOVASCULAR STRESS TEST  01-23-2011   NORMAL NUCLEAR STUDY/ NO ISCHEMIA/ EF 81%   CYSTOSCOPY WITH BIOPSY N/A 06/03/2013   Procedure: CYSTOSCOPY WITH BIOPSY  INSTILLATION OF MARCAINE AND PYRIDIUM;  Surgeon: Hanley Ben, MD;  Location: [facility];  Service: Urology;  Laterality: N/A;   LAPAROSCOPIC CHOLECYSTECTOMY  11-08-2001   LAPAROSCOPIC LEFT SALPINGOOPHORECTOMY AND LYSYS ADHESIONS  03-10-2002   LAPAROSCOPIC REMOVAL OVARY REMNANT  2003   [place]  LAPAROSCOPY N/A 12/14/2012   Procedure: LAPAROSCOPY DIAGNOSTIC;  Surgeon: Stark Klein, MD;  Location: [facility];  Service: General;  Laterality: N/A;   TOTAL ABDOMINAL HYSTERECTOMY  2000   W/ RIGHT SALPINGOOPHORECTOMY   TRANSTHORACIC ECHOCARDIOGRAM  10-13-2010   NORMAL LVF/  EF 55-60%    Family History  Problem Relation Age of Onset   Sleep apnea Father    Depression Father     Alcohol abuse Father    Hypertension Father    Migraines Sister        headaches   Anxiety disorder Sister    Other Mother        MAC infection   Anxiety disorder Mother    Dementia Mother        likely Alzheimer's disease; symptom onset on late 70s/early 80s   Heart disease Paternal Grandmother    Hyperlipidemia Paternal Grandmother    Alcohol abuse Paternal Grandfather    Diabetes Neg Hx     SOCIAL HX: Quit smoking 1980   Current Outpatient Medications:    molnupiravir EUA 200 mg CAPS, Take 4 capsules (800 mg total) by mouth 2 (two) times daily for 5 days., Disp: 40 capsule, Rfl: 0   albuterol (VENTOLIN HFA) 108 (90 Base) MCG/ACT inhaler, Inhale 2 puffs into the lungs every 6 (six) hours as needed for wheezing or shortness of breath., Disp: 18 g, Rfl: 1   buPROPion (WELLBUTRIN XL) 150 MG 24 hr tablet, Take 450 mg by mouth every morning., Disp: , Rfl:    divalproex (DEPAKOTE ER) 250 MG 24 hr tablet, Take by mouth., Disp: , Rfl:    FENOFIBRATE PO, Take by mouth., Disp: , Rfl:    fluticasone (FLONASE) 50 MCG/ACT nasal spray, Place 2 sprays into both nostrils daily., Disp: 16 g, Rfl: 1   Fluticasone-Salmeterol (ADVAIR DISKUS) 250-50 MCG/DOSE AEPB, Inhale 1 puff into the lungs 2 (two) times daily., Disp: 60 each, Rfl: 3   gabapentin (NEURONTIN) 300 MG capsule, Take 300 mg by mouth at bedtime., Disp: , Rfl:    hydrOXYzine (ATARAX/VISTARIL) 25 MG tablet, Take 1 tablet (25 mg total) by mouth 3 (three) times daily as needed for itching., Disp: 30 tablet, Rfl: 0   LORazepam (ATIVAN) 0.5 MG tablet, Take 0.5 mg by mouth 2 (two) times daily. Take an extra 0.61m as needed., Disp: , Rfl:    meclizine (ANTIVERT) 25 MG tablet, Take 1 tablet (25 mg total) by mouth 3 (three) times daily as needed for dizziness., Disp: 30 tablet, Rfl: 0   omeprazole (PRILOSEC) 40 MG capsule, Take 40 mg by mouth every evening., Disp: , Rfl:    ondansetron (ZOFRAN) 4 MG tablet, Take 1 tablet (4 mg total) by mouth daily  as needed for nausea., Disp: 10 tablet, Rfl: 0   potassium chloride SA (KLOR-CON) 20 MEQ tablet, Take 1 tablet (20 mEq total) by mouth daily., Disp: 30 tablet, Rfl: 2  EXAM:  VITALS per patient if applicable:  GENERAL: alert, oriented, appears well and in no acute distress  HEENT: atraumatic, conjunttiva clear, no obvious abnormalities on inspection of external nose and ears  NECK: normal movements of the head and neck  LUNGS: on inspection no signs of respiratory distress, breathing rate appears normal, no obvious gross SOB, gasping or wheezing  CV: no obvious cyanosis  MS: moves all visible extremities without noticeable abnormality  PSYCH/NEURO: pleasant and cooperative, no obvious depression or anxiety, speech and thought processing grossly intact  ASSESSMENT AND PLAN:  Discussed the following assessment  and plan:  COVID-19.  Patient in no respiratory distress at this time. Onset of symptoms 2 days ago with positive test yesterday. -We discussed potential role of antiviral medications. -She is on several medications that could interact with Paxlovid.  We therefore recommended Molnupiravir 4 capsules by mouth twice daily for 5 days -Plenty fluids and rest -Continue to monitor pulse oximeter and be in touch if sats dropping below 90 -She is aware of CDC guidelines regarding isolation.     I discussed the assessment and treatment plan with the patient. The patient was provided an opportunity to ask questions and all were answered. The patient agreed with the plan and demonstrated an understanding of the instructions.   The patient was advised to call back or seek an in-person evaluation if the symptoms worsen or if the condition fails to improve as anticipated.     Carolann Littler, MD

## 2021-03-29 DIAGNOSIS — F3131 Bipolar disorder, current episode depressed, mild: Secondary | ICD-10-CM | POA: Diagnosis not present

## 2021-04-15 ENCOUNTER — Telehealth: Payer: Self-pay

## 2021-04-15 DIAGNOSIS — F29 Unspecified psychosis not due to a substance or known physiological condition: Secondary | ICD-10-CM | POA: Diagnosis not present

## 2021-04-18 DIAGNOSIS — E041 Nontoxic single thyroid nodule: Secondary | ICD-10-CM | POA: Diagnosis not present

## 2021-04-18 DIAGNOSIS — D440 Neoplasm of uncertain behavior of thyroid gland: Secondary | ICD-10-CM | POA: Diagnosis not present

## 2021-04-20 DIAGNOSIS — U071 COVID-19: Secondary | ICD-10-CM | POA: Diagnosis not present

## 2021-04-23 ENCOUNTER — Other Ambulatory Visit: Payer: Self-pay | Admitting: Surgery

## 2021-04-23 DIAGNOSIS — D440 Neoplasm of uncertain behavior of thyroid gland: Secondary | ICD-10-CM

## 2021-04-23 DIAGNOSIS — E041 Nontoxic single thyroid nodule: Secondary | ICD-10-CM

## 2021-04-24 ENCOUNTER — Ambulatory Visit
Admission: RE | Admit: 2021-04-24 | Discharge: 2021-04-24 | Disposition: A | Discharge status: Home / Self Care | Payer: Medicare Other | Source: Ambulatory Visit | Attending: Surgery | Admitting: Surgery

## 2021-04-24 ENCOUNTER — Ambulatory Visit: Payer: Self-pay | Admitting: Surgery

## 2021-04-24 DIAGNOSIS — E041 Nontoxic single thyroid nodule: Secondary | ICD-10-CM | POA: Diagnosis not present

## 2021-04-24 DIAGNOSIS — D440 Neoplasm of uncertain behavior of thyroid gland: Secondary | ICD-10-CM

## 2021-04-24 NOTE — Progress Notes (Signed)
Barium swallow shows deviation of esophagus due to thyroid mass, no other abnormality.  Plan to proceed with thyroid surgery.  [place], MD [facility] [place] practice Office: [phone]

## 2021-05-07 NOTE — Patient Instructions (Signed)
DUE TO COVID-19 ONLY ONE VISITOR IS ALLOWED TO COME WITH YOU AND STAY IN THE WAITING ROOM ONLY DURING PRE OP AND PROCEDURE DAY OF SURGERY IF YOU ARE GOING HOME AFTER SURGERY. IF YOU ARE SPENDING THE NIGHT 2 PEOPLE MAY VISIT WITH YOU IN YOUR PRIVATE ROOM AFTER SURGERY UNTIL VISITING  HOURS ARE OVER AT 800 PM AND THE 2 VISITORS CANNOT SPEND THE NIGHT.   YOU NEED TO HAVE A COVID 19 TEST ON_10/5____THIS TEST MUST BE DONE BEFORE SURGERY,  COVID TESTING SITE  IS LOCATED AT [place], [place]. REMAIN IN YOUR CAR THIS IS A DRIVE UP TEST. AFTER YOUR COVID TEST PLEASE WEAR A MASK OUT IN PUBLIC AND SOCIAL DISTANCE AND [place] YOUR HANDS FREQUENTLY, ALSO ASK ALL YOUR CLOSE CONTACT PERSONS TO WEAR A MASK AND SOCIAL DISTANCE AND [place] THEIR HANDS FREQUENTLY ALSO.               [name]     Your procedure is scheduled on: 05/24/21   Report to [facility] Main  Entrance   Report to short stay at 5:15 AM     Call this number if you have problems the morning of surgery [phone]    Remember: Do not eat food  :After Midnight the night before your surgery,     You may have clear liquids from midnight until 4:30 am    CLEAR LIQUID DIET   Foods Allowed                                                                     Foods Excluded  Coffee and tea, regular and decaf                             liquids that you cannot  Plain Jell-O any favor except red or purple                                           see through such as: Fruit ices (not with fruit pulp)                                     milk, soups, orange juice  Iced Popsicles                                    All solid food Carbonated beverages, regular and diet                                    Cranberry, grape and apple juices Sports drinks like Gatorade Lightly seasoned clear broth or consume(fat free) Sugar    BRUSH YOUR TEETH MORNING OF SURGERY AND RINSE YOUR MOUTH OUT, NO CHEWING GUM CANDY OR  MINTS.     Take these medicines the morning of surgery with A SIP OF WATER: Depakote, Bupropion, Omeprazole. Use  your inhaler and bring it with you                                You may not have any metal on your body including hair pins and              piercings  Do not wear jewelry, make-up, lotions, powders or perfumes, deodorant             Do not wear nail polish on your fingernails.  Do not shave  48 hours prior to surgery.               Do not bring valuables to the hospital. [place].  Contacts, dentures or bridgework may not be worn into surgery.                   [facility] - Preparing for Surgery Before surgery, you can play an important role.  Because skin is not sterile, your skin needs to be as free of germs as possible.  You can reduce the number of germs on your skin by washing with CHG (chlorahexidine gluconate) soap before surgery.  CHG is an antiseptic cleaner which kills germs and bonds with the skin to continue killing germs even after washing. Please DO NOT use if you have an allergy to CHG or antibacterial soaps.  If your skin becomes reddened/irritated stop using the CHG and inform your nurse when you arrive at Short Stay. Do not shave (including legs and underarms) for at least 48 hours prior to the first CHG shower.   Please follow these instructions carefully:  1.  Shower with CHG Soap the night before surgery and the  morning of Surgery.  2.  If you choose to wash your hair, wash your hair first as usual with your  normal  shampoo.  3.  After you shampoo, rinse your hair and body thoroughly to remove the  shampoo.                            4.  Use CHG as you would any other liquid soap.  You can apply chg directly  to the skin and wash                       Gently with a scrungie or clean washcloth.  5.  Apply the CHG Soap to your body ONLY FROM THE NECK DOWN.   Do not use on face/ open                            Wound or open sores. Avoid contact with eyes, ears mouth and genitals (private parts).                       Wash face,  Genitals (private parts) with your normal soap.             6.  Wash thoroughly, paying special attention to the area where your surgery  will be performed.  7.  Thoroughly rinse your body with warm water from the neck down.  8.  DO NOT shower/wash with your normal soap after using and rinsing off  the CHG  Soap.                9.  Pat yourself dry with a clean towel.            10.  Wear clean pajamas.            11.  Place clean sheets on your bed the night of your first shower and do not  sleep with pets. Day of Surgery : Do not apply any lotions/deodorants the morning of surgery.  Please wear clean clothes to the hospital/surgery center.  FAILURE TO FOLLOW THESE INSTRUCTIONS MAY RESULT IN THE CANCELLATION OF YOUR SURGERY PATIENT SIGNATURE_________________________________  NURSE SIGNATURE__________________________________  ________________________________________________________________________

## 2021-05-08 ENCOUNTER — Encounter (HOSPITAL_COMMUNITY): Payer: Self-pay

## 2021-05-08 ENCOUNTER — Ambulatory Visit (HOSPITAL_COMMUNITY)
Admission: RE | Admit: 2021-05-08 | Discharge: 2021-05-08 | Disposition: A | Discharge status: Home / Self Care | Payer: Medicare Other | Source: Ambulatory Visit | Attending: Anesthesiology | Admitting: Anesthesiology

## 2021-05-08 ENCOUNTER — Other Ambulatory Visit: Payer: Self-pay

## 2021-05-08 ENCOUNTER — Encounter (HOSPITAL_COMMUNITY)
Admission: RE | Admit: 2021-05-08 | Discharge: 2021-05-08 | Disposition: A | Discharge status: Home / Self Care | Payer: Medicare Other | Source: Ambulatory Visit | Attending: Surgery | Admitting: Surgery

## 2021-05-08 DIAGNOSIS — Z01811 Encounter for preprocedural respiratory examination: Secondary | ICD-10-CM | POA: Insufficient documentation

## 2021-05-08 DIAGNOSIS — Z01818 Encounter for other preprocedural examination: Secondary | ICD-10-CM | POA: Diagnosis not present

## 2021-05-08 HISTORY — DX: Personal history of urinary calculi: Z87.442

## 2021-05-08 LAB — COMPREHENSIVE METABOLIC PANEL
ALT: 66 U/L — ABNORMAL HIGH (ref 0–44)
AST: 54 U/L — ABNORMAL HIGH (ref 15–41)
Albumin: 4 g/dL (ref 3.5–5.0)
Alkaline Phosphatase: 90 U/L (ref 38–126)
Anion gap: 7 (ref 5–15)
BUN: 25 mg/dL — ABNORMAL HIGH (ref 6–20)
CO2: 23 mmol/L (ref 22–32)
Calcium: 9.9 mg/dL (ref 8.9–10.3)
Chloride: 111 mmol/L (ref 98–111)
Creatinine, Ser: 0.72 mg/dL (ref 0.44–1.00)
GFR, Estimated: >60 mL/min (ref >60)
Glucose, Bld: 132 mg/dL — ABNORMAL HIGH (ref 70–99)
Potassium: 4.4 mmol/L (ref 3.5–5.1)
Sodium: 141 mmol/L (ref 135–145)
Total Bilirubin: 0.9 mg/dL (ref 0.3–1.2)
Total Protein: 7.5 g/dL (ref 6.5–8.1)

## 2021-05-08 LAB — CBC
HCT: 43.5 % (ref 36.0–46.0)
Hemoglobin: 14 g/dL (ref 12.0–15.0)
MCH: 30.6 pg (ref 26.0–34.0)
MCHC: 32.2 g/dL (ref 30.0–36.0)
MCV: 95.2 fL (ref 80.0–100.0)
Platelets: 256 10*3/uL (ref 150–400)
RBC: 4.57 MIL/uL (ref 3.87–5.11)
RDW: 12.7 % (ref 11.5–15.5)
WBC: 5.8 10*3/uL (ref 4.0–10.5)
nRBC: 0 % (ref 0.0–0.2)

## 2021-05-08 NOTE — Progress Notes (Signed)
COVID test Completed:05/22/21   PCP - Dr. B. [name] LOV 02/15/21 Cardiologist - Dr. Corky Downs  Chest x-ray - no EKG - 08/17/20-epic Stress Test - 2012 ECHO - 2012 Cardiac Cath - 2001 Pacemaker/ICD device last checked:NA  Sleep Study - yes. O2 drops due to enlarged thyroid not apnea CPAP -no   Fasting Blood Sugar - NA Checks Blood Sugar _____ times a day  Blood Thinner Instructions:NA Aspirin Instructions: Last Dose:  Anesthesia review: yes  Patient denies shortness of breath, fever, cough and chest pain at PAT appointment yes  Pt feels that SOB is due to thyroid and that she gets fatigued from it as well. She has gastroparesis and GERD   Patient verbalized understanding of instructions that were given to them at the PAT appointment. Patient was also instructed that they will need to review over the PAT instructions again at home before surgery. yes

## 2021-05-10 ENCOUNTER — Other Ambulatory Visit (HOSPITAL_COMMUNITY): Payer: Medicare Other

## 2021-05-19 ENCOUNTER — Encounter (HOSPITAL_COMMUNITY): Payer: Self-pay | Admitting: Surgery

## 2021-05-19 DIAGNOSIS — D440 Neoplasm of uncertain behavior of thyroid gland: Secondary | ICD-10-CM | POA: Diagnosis present

## 2021-05-19 NOTE — H&P (Signed)
REFERRING PHYSICIAN: [name], [name], MD  PROVIDER: Clive Parcel Charlotta Newton, MD  Chief Complaint: New Consultation (Right thyroid nodule)   History of Present Illness:  Patient is referred by Dr. Carolann Littler for surgical evaluation and management of enlarging right thyroid nodule. Patient's primary care physician is Dr. Bailey [name]. Patient has had a known right-sided thyroid nodule dating back to 2016. On sequential ultrasound scanning this is considerably enlarged in size from 2.7 cm to 5.7 cm in greatest dimension. Biopsy in 2016 was benign. 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 The sample was sent for Afirma testing. This returned with a result of benign, rendering a risk of malignancy of 4% or less. Patient however complains of discomfort in the lower anterior neck mainly associated with swallowing. She describes this as a sore throat pain. Patient notes occasional episodes of dysphagia, mainly with solid food. She has had no prior head or neck surgery. She has never been on thyroid medication. Recent TSH level is normal at 1.42. There is a family history of thyroid cancer in the patient's father. He apparently had a very aggressive type of tumor being diagnosed at age 81 and passing away at age 88. They are uncertain as to any details. Patient presents today to discuss possible resection of the dominant nodule in the right thyroid lobe.  Review of Systems: A complete review of systems was obtained from the patient. I have reviewed this information and discussed as appropriate with the patient. See HPI as well for other ROS.  Review of Systems  Constitutional: Negative.  HENT: Positive for sore throat.  Eyes: Negative.  Respiratory: Negative.  Cardiovascular: Negative.  Gastrointestinal: Negative.  Genitourinary: Negative.  Musculoskeletal: Negative.  Skin: Negative.  Neurological: Negative.  Endo/Heme/Allergies:  Negative.  Psychiatric/Behavioral: Negative.  Bipolar disorder    Medical History: Past Medical History:  Diagnosis Date   Autonomic neuropathy   Bipolar disorder   Patient Active Problem List  Diagnosis   Fatigue   Abnormality of gait   Anemia   Bipolar I disorder (CMS-HCC)   Diplopia   Endometriosis   Fatty metamorphosis of liver   Gastroparesis   Lipoma of other specified sites   Lump or mass in breast   Obstructive sleep apnea   Palpitations   Psychosis (CMS-HCC)   Vitamin D deficiency   Thyroid nodule   Neoplasm of uncertain behavior of thyroid gland   Past Surgical History:  Procedure Laterality Date   CHOLECYSTECTOMY   HYSTERECTOMY    Allergies  Allergen Reactions   Bupropion Rash   Other Nausea And Vomiting and Nausea   Duloxetine Other (See Comments) and Swelling  Other reaction(s): SWELLING/EDEMA Other reaction(s): SWELLING/EDEMA   Lurasidone Hcl Unknown   Morphine Nausea   Wellbutrin [Bupropion Hcl] Rash   Zolpidem Other (See Comments)  Per patient this caused her to fall   Current Outpatient Medications on File Prior to Visit  Medication Sig Dispense Refill   benztropine (COGENTIN) 0.5 MG tablet Take 0.5 mg by mouth nightly.    gabapentin (NEURONTIN) 300 MG capsule Take 300 mg by mouth nightly.    levomilnacipran 40 mg Cs24 Take 40 mg by mouth once daily.   lithium 600 MG capsule Take 600 mg by mouth.   PALIPERIDONE PALMITATE (INVEGA SUSTENNA IM) Inject into the muscle monthly.   pantoprazole (PROTONIX) 40 MG DR tablet Take 40 mg by mouth once daily.   zolpidem (AMBIEN) 5 MG tablet  Take 5 mg by mouth nightly.    No current facility-administered medications on file prior to visit.   No family history on file.   Social History   Tobacco Use  Smoking Status Never Smoker  Smokeless Tobacco Not on file    Social History   Socioeconomic History   Marital status: Life Partner  Tobacco Use   Smoking status: Never Smoker  Substance  and Sexual Activity   Alcohol use: Yes  Alcohol/week: 0.0 standard drinks   Drug use: No   Objective:   Vitals:  BP: 120/80  Pulse: 103  Temp: 36.9 C (98.5 F)  SpO2: 98%  Weight: 68.9 kg (152 lb)  Height: 152.4 cm (5')   Body mass index is 29.69 kg/m.  Physical Exam   GENERAL APPEARANCE Development: normal Nutritional status: normal Gross deformities: none  SKIN Rash, lesions, ulcers: none Induration, erythema: none Nodules: none palpable  EYES Conjunctiva and lids: normal Pupils: equal and reactive Iris: normal bilaterally  EARS, NOSE, MOUTH, THROAT External ears: no lesion or deformity External nose: no lesion or deformity Hearing: grossly normal Due to Covid-19 pandemic, patient is wearing a mask.  NECK Symmetric: yes Trachea: midline Thyroid: There is a dominant smooth large nodule in the inferior right thyroid lobe extending beneath the right clavicle, mobile with swallowing, nontender to palpation. Left thyroid lobe is normal to palpation. There is no associated lymphadenopathy.  CHEST Respiratory effort: normal Retraction or accessory muscle use: no Breath sounds: normal bilaterally Rales, rhonchi, wheeze: none  CARDIOVASCULAR Auscultation: regular rhythm, normal rate Murmurs: none Pulses: radial pulse 2+ palpable Lower extremity edema: none  MUSCULOSKELETAL Station and gait: normal Digits and nails: no clubbing or cyanosis Muscle strength: grossly normal all extremities Range of motion: grossly normal all extremities Deformity: none  LYMPHATIC Cervical: none palpable Supraclavicular: none palpable  PSYCHIATRIC Oriented to person, place, and time: yes Mood and affect: normal for situation Judgment and insight: appropriate for situation  Assessment and Plan:   Thyroid nodule  Neoplasm of uncertain behavior of thyroid gland   Patient is referred by her primary care physician for surgical evaluation and management of a dominant  right-sided thyroid nodule which has significantly enlarged since initially diagnosed in 2016. Cytopathology and molecular genetic testing appear to be benign. However the patient has some compressive symptoms including throat pain and dysphagia which may be related to this large nodule in the right thyroid lobe.  Today we discussed the options for management. I would like to obtain a modified barium swallow study to rule out any other possible explanations for her throat pain or dysphagia. We discussed thyroid lobectomy versus total thyroidectomy. I have explained to her that I would favor right thyroid lobectomy as the procedure of choice in her case.  Today we discussed proceeding with thyroidectomy for definitive diagnosis and management. We discussed the risk and benefits of surgery. We discussed potential complications such as recurrent laryngeal nerve injury causing hoarseness and injury to parathyroid glands causing alterations in calcium levels. We discussed the size and location of the surgical incision. We discussed the hospital stay to be anticipated. We discussed the postoperative recovery and return to work and activities. We discussed the potential need for lifelong thyroid hormone replacement.   The risks and benefits of the procedure have been discussed at length with the patient. The patient understands the proposed procedure, potential alternative treatments, and the course of recovery to be expected. All of the patient's questions have been answered at this time.  The patient wishes to proceed with surgery.  Armandina Gemma, MD [facility] [place] practice Office: [phone]

## 2021-05-21 ENCOUNTER — Ambulatory Visit
Admission: RE | Admit: 2021-05-21 | Discharge: 2021-05-21 | Disposition: A | Discharge status: Home / Self Care | Payer: Medicare Other | Source: Ambulatory Visit | Attending: Obstetrics | Admitting: Obstetrics

## 2021-05-21 ENCOUNTER — Other Ambulatory Visit: Payer: Self-pay

## 2021-05-21 DIAGNOSIS — Z780 Asymptomatic menopausal state: Secondary | ICD-10-CM | POA: Diagnosis not present

## 2021-05-21 DIAGNOSIS — M85851 Other specified disorders of bone density and structure, right thigh: Secondary | ICD-10-CM | POA: Diagnosis not present

## 2021-05-21 DIAGNOSIS — M810 Age-related osteoporosis without current pathological fracture: Secondary | ICD-10-CM | POA: Diagnosis not present

## 2021-05-21 DIAGNOSIS — E8941 Symptomatic postprocedural ovarian failure: Secondary | ICD-10-CM

## 2021-05-22 ENCOUNTER — Other Ambulatory Visit: Payer: Self-pay | Admitting: Surgery

## 2021-05-23 LAB — SARS CORONAVIRUS 2 (TAT 6-24 HRS): SARS Coronavirus 2: NEGATIVE

## 2021-05-23 NOTE — Anesthesia Preprocedure Evaluation (Addendum)
Anesthesia Evaluation  Patient identified by MRN, date of birth, ID band Patient awake    Reviewed: Allergy & Precautions, NPO status   Airway Mallampati: II  TM Distance: >3 FB     Dental   Pulmonary pneumonia, former smoker,    breath sounds clear to auscultation       Cardiovascular + angina  Rhythm:Regular Rate:Normal     Neuro/Psych  Headaches, PSYCHIATRIC DISORDERS Anxiety    GI/Hepatic Neg liver ROS, GERD  ,  Endo/Other  negative endocrine ROS  Renal/GU negative Renal ROS     Musculoskeletal   Abdominal   Peds  Hematology   Anesthesia Other Findings   Reproductive/Obstetrics                            Anesthesia Physical Anesthesia Plan  ASA: 3  Anesthesia Plan: General   Post-op Pain Management:    Induction: Intravenous  PONV Risk Score and Plan: 3 and Ondansetron, Dexamethasone and Midazolam  Airway Management Planned: Oral ETT  Additional Equipment:   Intra-op Plan:   Post-operative Plan: Extubation in OR  Informed Consent: I have reviewed the patients History and Physical, chart, labs and discussed the procedure including the risks, benefits and alternatives for the proposed anesthesia with the patient or authorized representative who has indicated his/her understanding and acceptance.     Dental advisory given  Plan Discussed with: Anesthesiologist and CRNA  Anesthesia Plan Comments:       Anesthesia Quick Evaluation

## 2021-05-24 ENCOUNTER — Encounter (HOSPITAL_COMMUNITY)
Admission: RE | Disposition: A | Discharge status: Home / Self Care | Payer: Self-pay | Source: Ambulatory Visit | Attending: Surgery

## 2021-05-24 ENCOUNTER — Ambulatory Visit (HOSPITAL_COMMUNITY): Payer: Medicare Other | Admitting: Registered Nurse

## 2021-05-24 ENCOUNTER — Ambulatory Visit (HOSPITAL_COMMUNITY)
Admission: RE | Admit: 2021-05-24 | Discharge: 2021-05-25 | Disposition: A | Discharge status: Home / Self Care | Payer: Medicare Other | Source: Ambulatory Visit | Attending: Surgery | Admitting: Surgery

## 2021-05-24 ENCOUNTER — Encounter (HOSPITAL_COMMUNITY): Payer: Self-pay | Admitting: Surgery

## 2021-05-24 ENCOUNTER — Other Ambulatory Visit: Payer: Self-pay

## 2021-05-24 DIAGNOSIS — Z87891 Personal history of nicotine dependence: Secondary | ICD-10-CM | POA: Insufficient documentation

## 2021-05-24 DIAGNOSIS — R1310 Dysphagia, unspecified: Secondary | ICD-10-CM | POA: Insufficient documentation

## 2021-05-24 DIAGNOSIS — D34 Benign neoplasm of thyroid gland: Secondary | ICD-10-CM | POA: Insufficient documentation

## 2021-05-24 DIAGNOSIS — E042 Nontoxic multinodular goiter: Secondary | ICD-10-CM | POA: Diagnosis not present

## 2021-05-24 DIAGNOSIS — K219 Gastro-esophageal reflux disease without esophagitis: Secondary | ICD-10-CM | POA: Diagnosis not present

## 2021-05-24 DIAGNOSIS — Z808 Family history of malignant neoplasm of other organs or systems: Secondary | ICD-10-CM | POA: Insufficient documentation

## 2021-05-24 DIAGNOSIS — D440 Neoplasm of uncertain behavior of thyroid gland: Secondary | ICD-10-CM | POA: Diagnosis not present

## 2021-05-24 DIAGNOSIS — E041 Nontoxic single thyroid nodule: Secondary | ICD-10-CM | POA: Diagnosis present

## 2021-05-24 DIAGNOSIS — E782 Mixed hyperlipidemia: Secondary | ICD-10-CM | POA: Diagnosis not present

## 2021-05-24 DIAGNOSIS — K589 Irritable bowel syndrome without diarrhea: Secondary | ICD-10-CM | POA: Diagnosis not present

## 2021-05-24 DIAGNOSIS — D497 Neoplasm of unspecified behavior of endocrine glands and other parts of nervous system: Secondary | ICD-10-CM | POA: Diagnosis not present

## 2021-05-24 HISTORY — PX: THYROIDECTOMY: SHX17

## 2021-05-24 SURGERY — THYROIDECTOMY
Anesthesia: General | Site: Neck

## 2021-05-24 MED ORDER — ACETAMINOPHEN 325 MG PO TABS
650.0000 mg | ORAL_TABLET | Freq: Four times a day (QID) | ORAL | Status: DC | PRN
Start: 2021-05-24 — End: 2021-05-25

## 2021-05-24 MED ORDER — LEVOTHYROXINE SODIUM 88 MCG PO TABS: 88.0000 ug | ORAL_TABLET | Freq: Every day | ORAL | 3 refills | Status: DC

## 2021-05-24 MED ORDER — GABAPENTIN 300 MG PO CAPS
300.0000 mg | ORAL_CAPSULE | Freq: Every day | ORAL | Status: DC
  Administered 2021-05-24: 300 mg via ORAL
  Filled 2021-05-24: qty 1

## 2021-05-24 MED ORDER — SODIUM CHLORIDE 0.9 % IV SOLN
25.0000 mg | Freq: Once | INTRAVENOUS | Status: AC
  Administered 2021-05-24: 25 mg via INTRAVENOUS
  Filled 2021-05-24: qty 25

## 2021-05-24 MED ORDER — LORAZEPAM 0.5 MG PO TABS: 0.5000 mg | ORAL_TABLET | Freq: Every day | ORAL | Status: DC | PRN

## 2021-05-24 MED ORDER — FENTANYL CITRATE (PF) 100 MCG/2ML IJ SOLN
INTRAMUSCULAR | Status: AC
  Filled 2021-05-24: qty 2

## 2021-05-24 MED ORDER — LACTATED RINGERS IV SOLN: INTRAVENOUS | Status: DC

## 2021-05-24 MED ORDER — GABAPENTIN 300 MG PO CAPS: 300.0000 mg | ORAL_CAPSULE | Freq: Every day | ORAL | Status: DC | PRN

## 2021-05-24 MED ORDER — DEXAMETHASONE SODIUM PHOSPHATE 10 MG/ML IJ SOLN
INTRAMUSCULAR | Status: AC
  Filled 2021-05-24: qty 1

## 2021-05-24 MED ORDER — SCOPOLAMINE 1 MG/3DAYS TD PT72
MEDICATED_PATCH | TRANSDERMAL | Status: AC
  Filled 2021-05-24: qty 1

## 2021-05-24 MED ORDER — SODIUM CHLORIDE 0.45 % IV SOLN: INTRAVENOUS | Status: DC

## 2021-05-24 MED ORDER — PROPOFOL 10 MG/ML IV BOLUS
INTRAVENOUS | Status: DC | PRN
  Administered 2021-05-24: 150 mg via INTRAVENOUS

## 2021-05-24 MED ORDER — OXYCODONE HCL 5 MG PO TABS
5.0000 mg | ORAL_TABLET | ORAL | Status: DC | PRN
  Administered 2021-05-24 – 2021-05-25 (×2): 10 mg via ORAL
  Administered 2021-05-25: 5 mg via ORAL
  Filled 2021-05-24 (×3): qty 2

## 2021-05-24 MED ORDER — PHENYLEPHRINE HCL (PRESSORS) 10 MG/ML IV SOLN
INTRAVENOUS | Status: AC
  Filled 2021-05-24: qty 2

## 2021-05-24 MED ORDER — DEXAMETHASONE SODIUM PHOSPHATE 10 MG/ML IJ SOLN
INTRAMUSCULAR | Status: DC | PRN
  Administered 2021-05-24: 8 mg via INTRAVENOUS

## 2021-05-24 MED ORDER — SUGAMMADEX SODIUM 200 MG/2ML IV SOLN
INTRAVENOUS | Status: DC | PRN
  Administered 2021-05-24: 200 mg via INTRAVENOUS

## 2021-05-24 MED ORDER — FENTANYL CITRATE PF 50 MCG/ML IJ SOSY
PREFILLED_SYRINGE | INTRAMUSCULAR | Status: AC
  Filled 2021-05-24: qty 2

## 2021-05-24 MED ORDER — CHLORHEXIDINE GLUCONATE 0.12 % MT SOLN
15.0000 mL | Freq: Once | OROMUCOSAL | Status: AC
  Administered 2021-05-24: 15 mL via OROMUCOSAL

## 2021-05-24 MED ORDER — CHLORHEXIDINE GLUCONATE CLOTH 2 % EX PADS: 6.0000 | MEDICATED_PAD | Freq: Once | CUTANEOUS | Status: DC

## 2021-05-24 MED ORDER — CALCIUM CARBONATE ANTACID 500 MG PO CHEW: 2.0000 | CHEWABLE_TABLET | Freq: Three times a day (TID) | ORAL | 1 refills | Status: DC

## 2021-05-24 MED ORDER — BUPROPION HCL ER (XL) 300 MG PO TB24
450.0000 mg | ORAL_TABLET | Freq: Every morning | ORAL | Status: DC
  Administered 2021-05-25: 450 mg via ORAL
  Filled 2021-05-24: qty 1

## 2021-05-24 MED ORDER — CEFAZOLIN SODIUM-DEXTROSE 2-4 GM/100ML-% IV SOLN
2.0000 g | INTRAVENOUS | Status: AC
  Administered 2021-05-24: 2 g via INTRAVENOUS
  Filled 2021-05-24: qty 100

## 2021-05-24 MED ORDER — DIVALPROEX SODIUM ER 500 MG PO TB24
750.0000 mg | ORAL_TABLET | Freq: Every day | ORAL | Status: DC
  Administered 2021-05-24: 750 mg via ORAL
  Filled 2021-05-24: qty 1

## 2021-05-24 MED ORDER — ONDANSETRON HCL 4 MG/2ML IJ SOLN
INTRAMUSCULAR | Status: AC
  Filled 2021-05-24: qty 2

## 2021-05-24 MED ORDER — MIDAZOLAM HCL 2 MG/2ML IJ SOLN
INTRAMUSCULAR | Status: AC
  Filled 2021-05-24: qty 2

## 2021-05-24 MED ORDER — MIDAZOLAM HCL 5 MG/5ML IJ SOLN
INTRAMUSCULAR | Status: DC | PRN
  Administered 2021-05-24: 2 mg via INTRAVENOUS

## 2021-05-24 MED ORDER — ONDANSETRON 4 MG PO TBDP
4.0000 mg | ORAL_TABLET | Freq: Four times a day (QID) | ORAL | Status: DC | PRN
  Administered 2021-05-25: 4 mg via ORAL
  Filled 2021-05-24: qty 1

## 2021-05-24 MED ORDER — ALBUTEROL SULFATE (2.5 MG/3ML) 0.083% IN NEBU: 2.5000 mg | INHALATION_SOLUTION | Freq: Four times a day (QID) | RESPIRATORY_TRACT | Status: DC | PRN

## 2021-05-24 MED ORDER — HEMOSTATIC AGENTS (NO CHARGE) OPTIME
TOPICAL | Status: DC | PRN
  Administered 2021-05-24: 1

## 2021-05-24 MED ORDER — LIDOCAINE HCL (PF) 2 % IJ SOLN
INTRAMUSCULAR | Status: AC
  Filled 2021-05-24: qty 5

## 2021-05-24 MED ORDER — CALCIUM CARBONATE 1250 (500 CA) MG PO TABS
2.0000 | ORAL_TABLET | Freq: Three times a day (TID) | ORAL | Status: DC
  Administered 2021-05-25 (×2): 1000 mg via ORAL
  Filled 2021-05-24 (×6): qty 1

## 2021-05-24 MED ORDER — AMISULPRIDE (ANTIEMETIC) 5 MG/2ML IV SOLN: 5.0000 mg | Freq: Once | INTRAVENOUS | Status: DC | PRN

## 2021-05-24 MED ORDER — ROCURONIUM BROMIDE 10 MG/ML (PF) SYRINGE
PREFILLED_SYRINGE | INTRAVENOUS | Status: AC
  Filled 2021-05-24: qty 10

## 2021-05-24 MED ORDER — HYDROMORPHONE HCL 1 MG/ML IJ SOLN
1.0000 mg | INTRAMUSCULAR | Status: DC | PRN
Start: 2021-05-24 — End: 2021-05-25
  Administered 2021-05-24 – 2021-05-25 (×2): 1 mg via INTRAVENOUS
  Filled 2021-05-24 (×2): qty 1

## 2021-05-24 MED ORDER — LIDOCAINE 2% (20 MG/ML) 5 ML SYRINGE
INTRAMUSCULAR | Status: DC | PRN
  Administered 2021-05-24: 60 mg via INTRAVENOUS

## 2021-05-24 MED ORDER — TRAMADOL HCL 50 MG PO TABS
ORAL_TABLET | ORAL | Status: AC
  Filled 2021-05-24: qty 1

## 2021-05-24 MED ORDER — FENTANYL CITRATE PF 50 MCG/ML IJ SOSY
PREFILLED_SYRINGE | INTRAMUSCULAR | Status: AC
  Filled 2021-05-24: qty 1

## 2021-05-24 MED ORDER — FENTANYL CITRATE PF 50 MCG/ML IJ SOSY
25.0000 ug | PREFILLED_SYRINGE | INTRAMUSCULAR | Status: DC | PRN
  Administered 2021-05-24 (×2): 50 ug via INTRAVENOUS
  Administered 2021-05-24 (×2): 25 ug via INTRAVENOUS

## 2021-05-24 MED ORDER — ORAL CARE MOUTH RINSE: 15.0000 mL | Freq: Once | OROMUCOSAL | Status: AC

## 2021-05-24 MED ORDER — ONDANSETRON HCL 4 MG/2ML IJ SOLN
INTRAMUSCULAR | Status: DC | PRN
  Administered 2021-05-24: 4 mg via INTRAVENOUS

## 2021-05-24 MED ORDER — FENTANYL CITRATE (PF) 100 MCG/2ML IJ SOLN
INTRAMUSCULAR | Status: DC | PRN
  Administered 2021-05-24: 25 ug via INTRAVENOUS
  Administered 2021-05-24 (×3): 50 ug via INTRAVENOUS

## 2021-05-24 MED ORDER — 0.9 % SODIUM CHLORIDE (POUR BTL) OPTIME
TOPICAL | Status: DC | PRN
  Administered 2021-05-24: 1000 mL

## 2021-05-24 MED ORDER — TRAMADOL HCL 50 MG PO TABS
50.0000 mg | ORAL_TABLET | Freq: Four times a day (QID) | ORAL | Status: DC | PRN
  Administered 2021-05-24: 50 mg via ORAL

## 2021-05-24 MED ORDER — SCOPOLAMINE 1 MG/3DAYS TD PT72
MEDICATED_PATCH | TRANSDERMAL | Status: DC | PRN
  Administered 2021-05-24: 1 via TRANSDERMAL

## 2021-05-24 MED ORDER — ACETAMINOPHEN 650 MG RE SUPP: 650.0000 mg | Freq: Four times a day (QID) | RECTAL | Status: DC | PRN

## 2021-05-24 MED ORDER — PROPOFOL 10 MG/ML IV BOLUS
INTRAVENOUS | Status: AC
  Filled 2021-05-24: qty 20

## 2021-05-24 MED ORDER — ONDANSETRON HCL 4 MG/2ML IJ SOLN
4.0000 mg | Freq: Four times a day (QID) | INTRAMUSCULAR | Status: DC | PRN
  Administered 2021-05-24: 4 mg via INTRAVENOUS

## 2021-05-24 MED ORDER — ROCURONIUM BROMIDE 10 MG/ML (PF) SYRINGE
PREFILLED_SYRINGE | INTRAVENOUS | Status: DC | PRN
  Administered 2021-05-24: 60 mg via INTRAVENOUS

## 2021-05-24 SURGICAL SUPPLY — 30 items
ATTRACTOMAT 16X20 MAGNETIC DRP (DRAPES) ×2 IMPLANT
BAG COUNTER SPONGE SURGICOUNT (BAG) ×2 IMPLANT
BLADE SURG 15 STRL LF DISP TIS (BLADE) ×1 IMPLANT
BLADE SURG 15 STRL SS (BLADE) ×2
CHLORAPREP W/TINT 26 (MISCELLANEOUS) ×2 IMPLANT
CLIP TI MEDIUM 6 (CLIP) ×6 IMPLANT
CLIP TI WIDE RED SMALL 6 (CLIP) ×4 IMPLANT
COVER SURGICAL LIGHT HANDLE (MISCELLANEOUS) ×2 IMPLANT
DERMABOND ADVANCED (GAUZE/BANDAGES/DRESSINGS) ×1
DERMABOND ADVANCED .7 DNX12 (GAUZE/BANDAGES/DRESSINGS) ×1 IMPLANT
DRAPE LAPAROTOMY T 98X78 PEDS (DRAPES) ×2 IMPLANT
DRAPE UTILITY XL STRL (DRAPES) ×2 IMPLANT
ELECT PENCIL ROCKER SW 15FT (MISCELLANEOUS) ×2 IMPLANT
ELECT REM PT RETURN 15FT ADLT (MISCELLANEOUS) ×2 IMPLANT
GAUZE 4X4 16PLY ~~LOC~~+RFID DBL (SPONGE) ×2 IMPLANT
GLOVE SURG SYN 7.5  E (GLOVE) ×4
GLOVE SURG SYN 7.5 E (GLOVE) ×2 IMPLANT
GOWN STRL REUS W/TWL XL LVL3 (GOWN DISPOSABLE) ×4 IMPLANT
HEMOSTAT SURGICEL 2X4 FIBR (HEMOSTASIS) ×2 IMPLANT
ILLUMINATOR WAVEGUIDE N/F (MISCELLANEOUS) ×2 IMPLANT
KIT BASIN OR (CUSTOM PROCEDURE TRAY) ×2 IMPLANT
KIT TURNOVER KIT A (KITS) ×2 IMPLANT
PACK BASIC VI WITH GOWN DISP (CUSTOM PROCEDURE TRAY) ×2 IMPLANT
SHEARS HARMONIC 9CM CVD (BLADE) IMPLANT
SUT MNCRL AB 4-0 PS2 18 (SUTURE) ×2 IMPLANT
SUT VIC AB 3-0 SH 18 (SUTURE) ×4 IMPLANT
SYR BULB IRRIG 60ML STRL (SYRINGE) ×2 IMPLANT
TOWEL OR 17X26 10 PK STRL BLUE (TOWEL DISPOSABLE) ×2 IMPLANT
TOWEL OR NON WOVEN STRL DISP B (DISPOSABLE) ×2 IMPLANT
TUBING CONNECTING 10 (TUBING) ×2 IMPLANT

## 2021-05-24 NOTE — Anesthesia Procedure Notes (Signed)
Procedure Name: Intubation Date/Time: 05/24/2021 7:34 AM Performed by: Victoriano Lain, CRNA Pre-anesthesia Checklist: Patient identified, Emergency Drugs available, Patient being monitored, Timeout performed and Suction available Patient Re-evaluated:Patient Re-evaluated prior to induction Oxygen Delivery Method: Circle system utilized Preoxygenation: Pre-oxygenation with 100% oxygen Induction Type: IV induction Ventilation: Mask ventilation without difficulty Laryngoscope Size: Mac and 4 Grade View: Grade I Tube type: Oral Tube size: 7.0 mm Number of attempts: 1 Airway Equipment and Method: Stylet Placement Confirmation: ETT inserted through vocal cords under direct vision, positive ETCO2 and breath sounds checked- equal and bilateral Secured at: 21 cm Tube secured with: Tape Dental Injury: Teeth and Oropharynx as per pre-operative assessment

## 2021-05-24 NOTE — Transfer of Care (Signed)
Immediate Anesthesia Transfer of Care Note  Patient: Eleyna E Flett  Procedure(s) Performed: TOTAL THYROIDECTOMY (Neck)  Patient Location: PACU  Anesthesia Type:General  Level of Consciousness: awake, alert , oriented and patient cooperative  Airway & Oxygen Therapy: Patient Spontanous Breathing and Patient connected to face mask oxygen  Post-op Assessment: Report given to RN, Post -op Vital signs reviewed and stable and Patient moving all extremities  Post vital signs: Reviewed and stable  Last Vitals:  Vitals Value Taken Time  BP 150/93 05/24/21 0923  Temp    Pulse 98 05/24/21 0925  Resp 26 05/24/21 0925  SpO2 92 % 05/24/21 0925  Vitals shown include unvalidated device data.  Last Pain:  Vitals:   05/24/21 0609  TempSrc:   PainSc: 0-No pain         Complications: No notable events documented.

## 2021-05-24 NOTE — Discharge Instructions (Signed)
[facility] - Dr. Deyvi Bonanno  THYROID & PARATHYROID SURGERY:  POST-OP INSTRUCTIONS  Always review the instruction sheet provided by the hospital nurse at discharge.  A prescription for pain medication may be sent to your pharmacy at the time of discharge.  Take your pain medication as prescribed.  If narcotic pain medicine is not needed, then you may take acetaminophen (Tylenol) or ibuprofen (Advil) as needed for pain or soreness.  Take your normal home medications as prescribed unless otherwise directed.  If you need a refill on your pain medication, please contact the office during regular business hours.  Prescriptions will not be processed by the office after 5:00PM or on weekends.  Start with a light diet upon arrival home, such as soup and crackers or toast.  Be sure to drink plenty of fluids.  Resume your normal diet the day after surgery.  Most patients will experience some swelling and bruising on the chest and neck area.  Ice packs will help for the first 48 hours after arriving home.  Swelling and bruising will take several days to resolve.   It is common to experience some constipation after surgery.  Increasing fluid intake and taking a stool softener (Colace) will usually help to prevent this problem.  A mild laxative (Milk of Magnesia or Miralax) should be taken according to package directions if there has been no bowel movement after 48 hours.  Dermabond glue covers your incision. This seals the wound and you may shower at any time. The Dermabond will remain in place for about a week.  You may gradually remove the glue when it loosens around the edges.  If you need to loosen the Dermabond for removal, apply a layer of Vaseline to the wound for 15 minutes and then remove with a Kleenex. Your sutures are under the skin and will not show - they will dissolve on their own.  You may resume light daily activities beginning the day after discharge (such as self-care,  walking, climbing stairs), gradually increasing activities as tolerated. You may have sexual intercourse when it is comfortable. Refrain from any heavy lifting or straining until approved by your doctor. You may drive when you no longer are taking prescription pain medication, you can comfortably wear a seatbelt, and you can safely maneuver your car and apply the brakes.  You will see your doctor in the office for a follow-up appointment approximately three weeks after your surgery.  Make sure that you call for this appointment within a day or two after you arrive home to insure a convenient appointment time. Please have any requested laboratory tests performed a few days prior to your office visit so that the results will be available at your follow up appointment.  WHEN TO CALL THE [facility] OFFICE: -- Fever greater than 101.5 -- Inability to urinate -- Nausea and/or vomiting - persistent -- Extreme swelling or bruising -- Continued bleeding from incision -- Increased pain, redness, or drainage from the incision -- Difficulty swallowing or breathing -- Muscle cramping or spasms -- Numbness or tingling in hands or around lips  The clinic staff is available to answer your questions during regular business hours.  Please don't hesitate to call and ask to speak to one of the nurses if you have concerns.  [facility] OFFICE: [phone] (24 hours)  Please sign up for MyChart accounts. This will allow you to communicate directly with my nurse or myself without having to call the office. It will also allow you   to view your test results. You will need to enroll in MyChart for my office ([facility]) and for the hospital ([facility]).  Tehya Leath, MD [facility] practice 

## 2021-05-24 NOTE — Op Note (Signed)
Procedure Note  Pre-operative Diagnosis:  thyroid neoplasm of uncertain behavior, multiple thyroid nodules  Post-operative Diagnosis:  same  Surgeon:  Armandina Gemma, MD  Assistant:  none   Procedure:  Total thyroidectomy  Anesthesia:  General  Estimated Blood Loss:  minimal  Drains: none         Specimen: thyroid to pathology  Indications:  Patient is referred by Dr. Carolann Littler for surgical evaluation and management of enlarging right thyroid nodule. Patient's primary care physician is Dr. Bailey [name]. Patient has had a known right-sided thyroid nodule dating back to 2016. On sequential ultrasound scanning this is considerably enlarged in size from 2.7 cm to 5.7 cm in greatest dimension. Biopsy in 2016 was benign. 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 The sample was sent for Afirma testing. This returned with a result of benign, rendering a risk of malignancy of 4% or less. Patient however complains of discomfort in the lower anterior neck mainly associated with swallowing. She describes this as a sore throat pain. Patient notes occasional episodes of dysphagia, mainly with solid food. She has had no prior head or neck surgery. She has never been on thyroid medication. Recent TSH level is normal at 1.42. There is a family history of thyroid cancer in the patient's father. He apparently had a very aggressive type of tumor being diagnosed at age 5 and passing away at age 18. They are uncertain as to any details. Patient presents today for thyroidectomy.  Procedure Details: Procedure was done in OR #4 at the [facility]. The patient was brought to the operating room and placed in a supine position on the operating room table. Following administration of general anesthesia, the patient was positioned and then prepped and draped in the usual aseptic fashion. After ascertaining that an adequate level of anesthesia had been  achieved, a small Kocher incision was made with #15 blade. Dissection was carried through subcutaneous tissues and platysma.Hemostasis was achieved with the electrocautery. Skin flaps were elevated cephalad and caudad from the thyroid notch to the sternal notch. A Mahorner self-retaining retractor was placed for exposure. Strap muscles were incised in the midline and dissection was begun on the left side.  Strap muscles were reflected laterally.  Left thyroid lobe was normal in size with small nodules and a cyst.  The left lobe was gently mobilized with blunt dissection. Superior pole vessels were dissected out and divided individually between small and medium ligaclips with the harmonic scalpel. The thyroid lobe was rolled anteriorly. Branches of the inferior thyroid artery were divided between small ligaclips with the harmonic scalpel. Inferior venous tributaries were divided between ligaclips. Both the superior and inferior parathyroid glands were identified and preserved on their vascular pedicles. The recurrent laryngeal nerve was identified and preserved along its course. The ligament of Gwenlyn Found was released with the electrocautery and the gland was mobilized onto the anterior trachea. Isthmus was mobilized across the midline. There was a moderate sized pyramidal lobe present which was dissected off of the thyroid cartilage and resected with the isthmus. Dry pack was placed in the left neck.  The right thyroid lobe was gently mobilized with blunt dissection. Right thyroid lobe was markedly enlarged with a dominant nodule involving the mid and inferior lobe. Superior pole vessels were dissected out and divided between small and medium ligaclips with the Harmonic scalpel. Superior parathyroid was identified and preserved. Inferior venous tributaries were divided between medium ligaclips with the harmonic  scalpel. The right thyroid lobe was rolled anteriorly and the branches of the inferior thyroid artery divided  between small ligaclips. The right recurrent laryngeal nerve was identified and preserved along its course. The ligament of Gwenlyn Found was released with the electrocautery. The right thyroid lobe was mobilized onto the anterior trachea and the remainder of the thyroid was dissected off the anterior trachea and the thyroid was completely excised. A suture was used to mark the right lobe. The entire thyroid gland was submitted to pathology for review.  The neck was irrigated with warm saline. Fibrillar was placed throughout the operative field. Strap muscles were approximated in the midline with interrupted 3-0 Vicryl sutures. Platysma was closed with interrupted 3-0 Vicryl sutures. Skin was closed with a running 4-0 Monocryl subcuticular suture. Wound was washed and Dermabond was applied. The patient was awakened from anesthesia and brought to the recovery room. The patient tolerated the procedure well.   Armandina Gemma, MD [facility] Office: [phone]

## 2021-05-24 NOTE — Interval H&P Note (Signed)
History and Physical Interval Note:  05/24/2021 6:56 AM  [name]  has presented today for surgery, with the diagnosis of THYROID NODULE.  The various methods of treatment have been discussed with the patient and family. After consideration of risks, benefits and other options for treatment, the patient has consented to    Procedure(s): TOTAL THYROIDECTOMY (N/A) as a surgical intervention.    The patient's history has been reviewed, patient examined, no change in status, stable for surgery.  I have reviewed the patient's chart and labs.  Questions were answered to the patient's satisfaction.    Armandina Gemma, [facility] [place] practice Office: [place]

## 2021-05-25 DIAGNOSIS — Z808 Family history of malignant neoplasm of other organs or systems: Secondary | ICD-10-CM | POA: Diagnosis not present

## 2021-05-25 DIAGNOSIS — Z87891 Personal history of nicotine dependence: Secondary | ICD-10-CM | POA: Diagnosis not present

## 2021-05-25 DIAGNOSIS — E042 Nontoxic multinodular goiter: Secondary | ICD-10-CM | POA: Diagnosis not present

## 2021-05-25 DIAGNOSIS — D34 Benign neoplasm of thyroid gland: Secondary | ICD-10-CM | POA: Diagnosis not present

## 2021-05-25 DIAGNOSIS — R1310 Dysphagia, unspecified: Secondary | ICD-10-CM | POA: Diagnosis not present

## 2021-05-25 LAB — BASIC METABOLIC PANEL
Anion gap: 9 (ref 5–15)
BUN: 14 mg/dL (ref 6–20)
CO2: 26 mmol/L (ref 22–32)
Calcium: 9.4 mg/dL (ref 8.9–10.3)
Chloride: 102 mmol/L (ref 98–111)
Creatinine, Ser: 0.83 mg/dL (ref 0.44–1.00)
GFR, Estimated: >60 mL/min (ref >60)
Glucose, Bld: 126 mg/dL — ABNORMAL HIGH (ref 70–99)
Potassium: 4.7 mmol/L (ref 3.5–5.1)
Sodium: 137 mmol/L (ref 135–145)

## 2021-05-25 MED ORDER — ACETAMINOPHEN 500 MG PO TABS
1000.0000 mg | ORAL_TABLET | Freq: Four times a day (QID) | ORAL | Status: DC
  Administered 2021-05-25: 1000 mg via ORAL
  Filled 2021-05-25: qty 2

## 2021-05-25 MED ORDER — METHOCARBAMOL 1000 MG/10ML IJ SOLN
500.0000 mg | Freq: Four times a day (QID) | INTRAMUSCULAR | Status: DC | PRN
Start: 2021-05-25 — End: 2021-05-25
  Filled 2021-05-25: qty 5

## 2021-05-25 MED ORDER — BOOST / RESOURCE BREEZE PO LIQD CUSTOM: 1.0000 | Freq: Three times a day (TID) | ORAL | Status: DC

## 2021-05-25 MED ORDER — PHENOL 1.4 % MT LIQD
1.0000 | OROMUCOSAL | Status: DC | PRN
  Filled 2021-05-25: qty 177

## 2021-05-25 MED ORDER — OXYCODONE-ACETAMINOPHEN 5-325 MG PO TABS
1.0000 | ORAL_TABLET | Freq: Three times a day (TID) | ORAL | 0 refills | Status: AC | PRN
Start: 2021-05-25 — End: 2021-05-30

## 2021-05-25 MED ORDER — MENTHOL 3 MG MT LOZG
1.0000 | LOZENGE | OROMUCOSAL | Status: DC | PRN
  Filled 2021-05-25: qty 9

## 2021-05-25 NOTE — Plan of Care (Signed)

## 2021-05-25 NOTE — Progress Notes (Signed)
1 Day Post-Op   Subjective/Chief Complaint: C/o pain poorly controlled. Nausea yesterday.    Objective: Vital signs in last 24 hours: Temp:  [97.5 F (36.4 C)-98.4 F (36.9 C)] 98.2 F (36.8 C) (10/08 0604) Pulse Rate:  [84-106] 101 (10/08 0604) Resp:  [10-20] 17 (10/08 0604) BP: (99-152)/(62-93) 113/62 (10/08 0604) SpO2:  [87 %-100 %] 87 % (10/08 0604) Last BM Date: 05/23/21  Intake/Output from previous day: 10/07 0701 - 10/08 0700 In: 2887.5 [P.O.:360; I.V.:2377.5; IV Piggyback:150] Out: 1115 [Urine:1100; Blood:15] Intake/Output this shift: No intake/output data recorded.  Alert, calm Unlabored respirations Neck is flat, soft with expected mild ecchymosis surrounding incision which is clean dry and intact with dermabond  Lab Results:  No results for input(s): WBC, HGB, HCT, PLT in the last 72 hours. BMET Recent Labs    05/25/21 0512  NA 137  K 4.7  CL 102  CO2 26  GLUCOSE 126*  BUN 14  CREATININE 0.83  CALCIUM 9.4   PT/INR No results for input(s): LABPROT, INR in the last 72 hours. ABG No results for input(s): PHART, HCO3 in the last 72 hours.  Invalid input(s): PCO2, PO2  Studies/Results: No results found.  Anti-infectives: Anti-infectives (From admission, onward)    Start     Dose/Rate Route Frequency Ordered Stop   05/24/21 0600  ceFAZolin (ANCEF) IVPB 2g/100 mL premix        2 g 200 mL/hr over 30 Minutes Intravenous On call to O.R. 05/24/21 0538 05/24/21 0805       Assessment/Plan: s/p Procedure(s): TOTAL THYROIDECTOMY (N/A)  Stop IVF Multimodal pain control Add boost/ensure  Discharge pending improved pain control and tolerating diet   LOS: 0 days    Clovis Riley 05/25/2021

## 2021-05-26 NOTE — Anesthesia Postprocedure Evaluation (Signed)
Anesthesia Post Note  Patient: [name]  Procedure(s) Performed: TOTAL THYROIDECTOMY (Neck)     Patient location during evaluation: PACU Anesthesia Type: General Level of consciousness: awake Pain management: pain level controlled Vital Signs Assessment: post-procedure vital signs reviewed and stable Respiratory status: spontaneous breathing Cardiovascular status: stable Postop Assessment: no apparent nausea or vomiting Anesthetic complications: no   No notable events documented.  Last Vitals:  Vitals:   05/25/21 0604 05/25/21 1017  BP: 113/62 134/76  Pulse: (!) 101 92  Resp: 17 14  Temp: 36.8 C (!) 36.3 C  SpO2: (!) 87% 92%    Last Pain:  Vitals:   05/25/21 1017  TempSrc: Oral  PainSc:                  Shakima Nisley

## 2021-05-27 ENCOUNTER — Encounter (HOSPITAL_COMMUNITY): Payer: Self-pay | Admitting: Surgery

## 2021-05-28 LAB — SURGICAL PATHOLOGY

## 2021-05-29 NOTE — Progress Notes (Signed)
Good news.  All benign results on pathology.  Will review at follow up visit.  tmg  Armandina Gemma, [facility] [place] practice Office: [phone]

## 2021-06-03 DIAGNOSIS — E890 Postprocedural hypothyroidism: Secondary | ICD-10-CM | POA: Diagnosis not present

## 2021-06-06 DIAGNOSIS — Z23 Encounter for immunization: Secondary | ICD-10-CM | POA: Diagnosis not present

## 2021-06-14 DIAGNOSIS — F3131 Bipolar disorder, current episode depressed, mild: Secondary | ICD-10-CM | POA: Diagnosis not present

## 2021-07-05 DIAGNOSIS — E890 Postprocedural hypothyroidism: Secondary | ICD-10-CM | POA: Diagnosis not present

## 2021-07-24 DIAGNOSIS — F29 Unspecified psychosis not due to a substance or known physiological condition: Secondary | ICD-10-CM | POA: Diagnosis not present

## 2021-08-21 ENCOUNTER — Encounter: Payer: Self-pay | Admitting: Family Medicine

## 2021-09-02 NOTE — Progress Notes (Signed)
HPI: [name] is a 61 y.o. female, who is here today with her wife for CPE. She also would like to address some concerns and follow on some chronic medical problems. Last CPE over a year ago. She is established with gyn, she does not remember last visit.  Since her last visit, she has undergone total thyroidectomy.Last follow up with surgeon, Dr Harlow Asa, was on 07/18/21. She would like Ca++ and Mg++ checked among other labs.  She is not exercising regularly. She is trying to eat healthier, she is cooking at home.  Health Maintenance  Topic Date Due   Pneumococcal Vaccination (2 - PCV) 07/03/2019   Mammogram  09/19/2019   Tetanus Vaccine  08/22/2022*   Zoster (Shingles) Vaccine (1 of 2) 08/22/2022*   Colon Cancer Screening  10/11/2022   Flu Shot  Completed   COVID-19 Vaccine  Completed   Hepatitis C Screening: USPSTF Recommendation to screen - Ages 18-79 yo.  Completed   HIV Screening  Completed   HPV Vaccine  Aged Out   Pap Smear  Discontinued  *Topic was postponed. The date shown is not the original due date.   Immunization History  Administered Date(s) Administered   Influenza Split 07/18/2011   Influenza, Quadrivalent, Recombinant, Inj, Pf 05/19/2019   Influenza,inj,Quad PF,6+ Mos 07/02/2018, 08/06/2020   Influenza-Unspecified 06/06/2021   Moderna Sars-Covid-2 Vaccination 10/17/2019, 11/17/2019, 06/29/2020   Pfizer Covid-19 Vaccine Bivalent Booster 53yrs & up 06/06/2021   Pneumococcal Polysaccharide-23 07/02/2018   Td 06/13/2009   Tdap 06/13/2009   Hyperlipidemia: She is on nonpharmacologic treatment.  Lab Results  Component Value Date   CHOL 175 02/15/2021   HDL 37.40 (L) 02/15/2021   LDLCALC 54 05/15/2017   LDLDIRECT 104.0 02/15/2021   TRIG 269.0 (H) 02/15/2021   CHOLHDL 5 02/15/2021   Requesting referral to endocrinologist. Post surgical hypothyroidism: Currently she is on levothyroxine 112 mcg daily, dose was adjusted in 06/2021. TSH was 11.7 on  07/05/21. She is c/o fatigue,feels "exhausted." 5-7 hours of sleep, sometimes she feels rested when she gets up. Feels better after naps, which she has been doing frequently after thyroid surgery. Negative for muscle fasciculation, cramps, changes in bowel habits, or tremor. Sleep study years ago was borderline.  Bipolar disorder, depression, and anxiety: She follows with psychiatrist every 3 months.  Concerned about a mole on her right breast, which she has had "for months or years", changing in size and coloration. Reporting PHx of melanoma on forehead about 10 years ago, for the treatment after excision was not deemed necessary.  She is also c/o wheezing at night, intermittently. She has not seen her pulmonologist in a while. Albuterol inhaler 2 puff every 4-6 hours helps with symptoms. Negative for CP and dyspnea.  She would like vits check. Lab Results  Component Value Date   VITAMINB12 358 02/15/2021   Vitamin D and B12 deficiency: She takes a daily multivitamin.  She also takes  Mg 165 mg and ca++ with vit D gummy.  Review of Systems  Constitutional:  Positive for activity change and fatigue. Negative for appetite change, chills, fever and unexpected weight change.  HENT:  Negative for mouth sores, sore throat, trouble swallowing and voice change.   Eyes:  Negative for redness and visual disturbance.  Respiratory:  Negative for cough and shortness of breath.   Cardiovascular:  Negative for chest pain and leg swelling.  Gastrointestinal:  Negative for abdominal pain, nausea and vomiting.       No changes in  bowel habits.  Endocrine: Negative for cold intolerance, heat intolerance, polydipsia, polyphagia and polyuria.  Genitourinary:  Negative for decreased urine volume, dysuria, hematuria, vaginal bleeding and vaginal discharge.  Musculoskeletal:  Negative for gait problem and myalgias.  Skin:  Negative for color change and rash.  Allergic/Immunologic: Positive for  environmental allergies.  Neurological:  Negative for syncope, weakness and headaches.  Hematological:  Negative for adenopathy. Does not bruise/bleed easily.  Psychiatric/Behavioral:  Negative for confusion and hallucinations. The patient is nervous/anxious.   All other systems reviewed and are negative.  Current Outpatient Medications on File Prior to Visit  Medication Sig Dispense Refill   acetaminophen (TYLENOL) 325 MG tablet Take 650 mg by mouth every 6 (six) hours as needed for mild pain, fever or headache.     aspirin-acetaminophen-caffeine (EXCEDRIN MIGRAINE) 250-250-65 MG tablet Take 2 tablets by mouth every 6 (six) hours as needed for headache.     buPROPion (WELLBUTRIN XL) 150 MG 24 hr tablet Take 450 mg by mouth every morning.     calcium carbonate (TUMS) 500 MG chewable tablet Chew 2 tablets (400 mg of elemental calcium total) by mouth 3 (three) times daily. 90 tablet 1   divalproex (DEPAKOTE ER) 250 MG 24 hr tablet Take 750 mg by mouth at bedtime.     gabapentin (NEURONTIN) 300 MG capsule Take 300 mg by mouth See admin instructions. 300mg  oral daily as needed for anxiety And 300mg  daily at bedtime     levothyroxine (SYNTHROID) 112 MCG tablet Take 112 mcg by mouth every morning.     LORazepam (ATIVAN) 0.5 MG tablet Take 0.5-1 mg by mouth daily as needed for anxiety.     Multiple Vitamin (MULTIVITAMIN WITH MINERALS) TABS tablet Take 1 tablet by mouth daily.     albuterol (VENTOLIN HFA) 108 (90 Base) MCG/ACT inhaler Inhale 2 puffs into the lungs every 6 (six) hours as needed for wheezing or shortness of breath. 18 g 1   No current facility-administered medications on file prior to visit.    Past Medical History:  Diagnosis Date   Allergic rhinitis 03/26/2007   Anginal pain ([place]) 2001   Autonomic dysfunction 01/11/2015   Bipolar 1 disorder    Hx of psychosis; present since age 87   Chronic fatigue 06/21/2017   Community acquired pneumonia of right lung 19/37/9024    Complication of anesthesia    agitation   Costochondritis 02/14/2014   Diverticular disease of colon 08/06/2020   Double vision 03/15/2011   Dyspnea on effort 01/10/2011   Dysuria 02/15/2013   Elevated lactic acid level    Endometriosis 05/11/2008   Fatty liver disease, nonalcoholic 09/73/5329   Frequency of urination    Gastroesophageal reflux disease 08/06/2020   Headache    History of duodenal ulcer    History of kidney stones    History of lithium toxicity 05/01/2014   Hyperglycemia 01/10/2011   Hyperlipidemia, mixed 05/12/2017   Hyperprolactinemia 06/26/2015   Insomnia 05/12/2017   Irritable bowel syndrome 08/06/2020   Jaw pain 02/13/2012   poss tmj area     Leukocytosis 05/01/2014   Multinodular goiter 05/12/2017   Nocturia    Nondiabetic gastroparesis 01/11/2015   Pelvic pain    Plantar fasciitis 06/11/2007   Pneumonia 08/20/2020   Pruritus ani 08/06/2020   Schizoaffective disorder, bipolar type    Unclear if this is not better accounted for by longstanding history of bipolar disorder   Social anxiety disorder    Subacute bronchitis 07/10/2018   Thyroid nodule  02/02/2017   Urgency of urination    Vitamin D deficiency 09/23/2011   Allergies  Allergen Reactions   Morphine And Related Nausea And Vomiting and Nausea Only   Ambien [Zolpidem] Other (See Comments)    Per patient this caused her to fall   Duloxetine Swelling and Other (See Comments)    Other reaction(s): SWELLING/EDEMA   Latuda [Lurasidone Hcl]    Morphine Nausea Only    Social History   Socioeconomic History   Marital status: Married    Spouse name: Not on file   Number of children: 0   Years of education: 16   Highest education level: Bachelor's degree (e.g., BA, AB, BS)  Occupational History   Occupation: Disability    Comment: Nurse  Tobacco Use   Smoking status: Former    Packs/day: 0.01    Years: 1.00    Pack years: 0.01    Types: Cigarettes    Quit date: 1980    Years since  quitting: 43.0   Smokeless tobacco: Never   Tobacco comments:    ONLY SMOKED FOR 6 MONTHS --  QUIT  YRS AGO  Vaping Use   Vaping Use: Never used  Substance and Sexual Activity   Alcohol use: Yes    Comment: occ   Drug use: Never   Sexual activity: Not on file  Other Topics Concern   Not on file  Social History Narrative   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    no tobacco   Caffeine Use: very little, three times a week   Left handed    Social Determinants of Health   Financial Resource Strain: Low Risk    Difficulty of Paying Living Expenses: Not hard at all  Food Insecurity: No Food Insecurity   Worried About Charity fundraiser in the Last Year: Never true   Ran Out of Food in the Last Year: Never true  Transportation Needs: No Transportation Needs   Lack of Transportation (Medical): No   Lack of Transportation (Non-Medical): No  Physical Activity: Sufficiently Active   Days of Exercise per Week: 7 days   Minutes of Exercise per Session: 30 min  Stress: No Stress Concern Present   Feeling of Stress : Not at all  Social Connections: Moderately Integrated   Frequency of Communication with Friends and Family: Once a week   Frequency of Social Gatherings with Friends and Family: Twice a week   Attends Religious Services: 1 to 4 times per year   Active Member of [employer] or Organizations: No   Attends Archivist Meetings: Never   Marital Status: Married    Vitals:   09/03/21 0846  BP: 124/80  Pulse: 80  Resp: 16  SpO2: 97%   Body mass index is 30.88 kg/m.  Physical Exam Vitals and nursing note reviewed.  Constitutional:      General: She is not in acute distress.    Appearance: She is well-developed.  HENT:     Head: Normocephalic and atraumatic.     Right Ear: Hearing, tympanic membrane, ear canal and external ear normal.      Left Ear: Hearing, tympanic membrane, ear canal and external ear normal.     Mouth/Throat:     Mouth: Mucous membranes are moist.     Pharynx: Oropharynx is clear. Uvula midline.  Eyes:  Extraocular Movements: Extraocular movements intact.     Conjunctiva/sclera: Conjunctivae normal.     Pupils: Pupils are equal, round, and reactive to light.  Neck:     Thyroid: No thyroid mass.     Trachea: No tracheal deviation.  Cardiovascular:     Rate and Rhythm: Normal rate and regular rhythm.     Pulses:          Dorsalis pedis pulses are 2+ on the right side and 2+ on the left side.     Heart sounds: No murmur heard. Pulmonary:     Effort: Pulmonary effort is normal. No respiratory distress.     Breath sounds: Normal breath sounds.  Abdominal:     Palpations: Abdomen is soft. There is no hepatomegaly or mass.     Tenderness: There is no abdominal tenderness.  Genitourinary:    Comments: Deferred to gyn. Musculoskeletal:     Comments: No signs of synovitis appreciated.  Lymphadenopathy:     Cervical: No cervical adenopathy.     Upper Body:     Right upper body: No supraclavicular adenopathy.     Left upper body: No supraclavicular adenopathy.  Skin:    General: Skin is warm.     Findings: Lesion present. No erythema or rash.       Neurological:     General: No focal deficit present.     Mental Status: She is alert and oriented to person, place, and time.     Cranial Nerves: No cranial nerve deficit.     Gait: Gait normal.     Deep Tendon Reflexes:     Reflex Scores:      Bicep reflexes are 2+ on the right side and 2+ on the left side.      Patellar reflexes are 2+ on the right side and 2+ on the left side. Psychiatric:        Mood and Affect: Mood is anxious. Mood is not depressed.     Comments: Well groomed, good eye contact.   ASSESSMENT AND PLAN:  [name] was seen today for annual exam, follow-up, fatigue and hypothyroidism.  Diagnoses and all orders for this  visit: Orders Placed This Encounter  Procedures   VITAMIN D 25 Hydroxy (Vit-D Deficiency, Fractures)   Vitamin B12   Lipid panel   Magnesium   T3, free   T4, free   TSH   Comprehensive metabolic panel   Parathyroid hormone, intact (no Ca)   LDL cholesterol, direct   Ambulatory referral to Endocrinology   Lab Results  Component Value Date   TSH 0.28 (L) 09/03/2021   Lab Results  Component Value Date   VITAMINB12 674 09/03/2021   Lab Results  Component Value Date   CHOL 164 09/03/2021   HDL 45.00 09/03/2021   LDLCALC 54 05/15/2017   LDLDIRECT 98.0 09/03/2021   TRIG 215.0 (H) 09/03/2021   CHOLHDL 4 09/03/2021   Lab Results  Component Value Date   CREATININE 0.82 09/03/2021   BUN 16 09/03/2021   NA 140 09/03/2021   K 4.1 09/03/2021   CL 107 09/03/2021   CO2 22 09/03/2021   Lab Results  Component Value Date   ALT 58 (H) 09/03/2021   AST 33 09/03/2021   ALKPHOS 86 09/03/2021   BILITOT 0.7 09/03/2021   Routine general medical examination at a health care facility We discussed the importance of regular physical activity and healthy diet for prevention of chronic illness and/or complications. Preventive guidelines reviewed. Vaccination :  Shingrix and Tdap needed, recommend getting vaccines at her pharmacy due to insurance coverage. Ca++ and vit D supplementation to continue. Continue her female preventive care with her gyn. Next CPE in a year.  The 10-year ASCVD risk score (Arnett DK, et al., 2019) is: 5.9%   Values used to calculate the score:     Age: 7 years     Sex: Female     Is Non-Hispanic African American: No     Diabetic: No     Tobacco smoker: Yes     Systolic Blood Pressure: 356 mmHg     Is BP treated: No     HDL Cholesterol: 45 mg/dL     Total Cholesterol: 164 mg/dL  Patient request for diagnostic testing -     Magnesium  Seborrheic keratosis Reassured in regard to lesion under right breast.  Vitamin D deficiency Continue current dose of  vit D supplementation. Further recommendations according to 25 [place] vit D result.  Chronic fatigue We discussed possible etiologies. Some of her chronic medical problems and medications can be contributing factors. Further recommendations will be given according to lab results.   Schizoaffective disorder, bipolar type Following with psychiatrist q 3-4 months.  Postsurgical hypothyroidism Continue levothyroxine 112 mcg daily. Further recommendations according to TSH result. Endocrinology referral placed as requested.  Hyperlipidemia, mixed Non pharmacologic treatment recommended for now. Further recommendations will be given according to 10 years CVD risk score and lipid panel numbers.  B12 deficiency On daily multivitamin. Further recommendations according to B12 results.   Return in 1 year (on 09/03/2022) for cpe/follow up.   Landrie Beale G. [name], MD  [facility]. [place] office.

## 2021-09-03 ENCOUNTER — Encounter: Payer: Self-pay | Admitting: Family Medicine

## 2021-09-03 ENCOUNTER — Ambulatory Visit (INDEPENDENT_AMBULATORY_CARE_PROVIDER_SITE_OTHER): Payer: Medicare PPO | Admitting: Family Medicine

## 2021-09-03 VITALS — BP 124/80 | HR 80 | Resp 16 | Ht 60.0 in | Wt 158.1 lb

## 2021-09-03 DIAGNOSIS — E890 Postprocedural hypothyroidism: Secondary | ICD-10-CM

## 2021-09-03 DIAGNOSIS — F250 Schizoaffective disorder, bipolar type: Secondary | ICD-10-CM | POA: Diagnosis not present

## 2021-09-03 DIAGNOSIS — Z0189 Encounter for other specified special examinations: Secondary | ICD-10-CM

## 2021-09-03 DIAGNOSIS — E538 Deficiency of other specified B group vitamins: Secondary | ICD-10-CM

## 2021-09-03 DIAGNOSIS — E782 Mixed hyperlipidemia: Secondary | ICD-10-CM | POA: Diagnosis not present

## 2021-09-03 DIAGNOSIS — E559 Vitamin D deficiency, unspecified: Secondary | ICD-10-CM

## 2021-09-03 DIAGNOSIS — R5382 Chronic fatigue, unspecified: Secondary | ICD-10-CM | POA: Diagnosis not present

## 2021-09-03 DIAGNOSIS — Z0000 Encounter for general adult medical examination without abnormal findings: Secondary | ICD-10-CM | POA: Diagnosis not present

## 2021-09-03 DIAGNOSIS — R739 Hyperglycemia, unspecified: Secondary | ICD-10-CM

## 2021-09-03 DIAGNOSIS — L821 Other seborrheic keratosis: Secondary | ICD-10-CM

## 2021-09-03 HISTORY — DX: Deficiency of other specified B group vitamins: E53.8

## 2021-09-03 HISTORY — DX: Postprocedural hypothyroidism: E89.0

## 2021-09-03 LAB — COMPREHENSIVE METABOLIC PANEL
ALT: 58 U/L — ABNORMAL HIGH (ref 0–35)
AST: 33 U/L (ref 0–37)
Albumin: 4.1 g/dL (ref 3.5–5.2)
Alkaline Phosphatase: 86 U/L (ref 39–117)
BUN: 16 mg/dL (ref 6–23)
CO2: 22 mEq/L (ref 19–32)
Calcium: 9.5 mg/dL (ref 8.4–10.5)
Chloride: 107 mEq/L (ref 96–112)
Creatinine, Ser: 0.82 mg/dL (ref 0.40–1.20)
GFR: 78.16 mL/min (ref >60.00)
Glucose, Bld: 142 mg/dL — ABNORMAL HIGH (ref 70–99)
Potassium: 4.1 mEq/L (ref 3.5–5.1)
Sodium: 140 mEq/L (ref 135–145)
Total Bilirubin: 0.7 mg/dL (ref 0.2–1.2)
Total Protein: 7.2 g/dL (ref 6.0–8.3)

## 2021-09-03 LAB — VITAMIN B12: Vitamin B-12: 674 pg/mL (ref 211–911)

## 2021-09-03 LAB — LIPID PANEL
Cholesterol: 164 mg/dL (ref 0–200)
HDL: 45 mg/dL (ref >39.00)
NonHDL: 118.8
Total CHOL/HDL Ratio: 4
Triglycerides: 215 mg/dL — ABNORMAL HIGH (ref 0.0–149.0)
VLDL: 43 mg/dL — ABNORMAL HIGH (ref 0.0–40.0)

## 2021-09-03 LAB — TSH: TSH: 0.28 u[IU]/mL — ABNORMAL LOW (ref 0.35–5.50)

## 2021-09-03 LAB — VITAMIN D 25 HYDROXY (VIT D DEFICIENCY, FRACTURES): VITD: 31.93 ng/mL (ref 30.00–100.00)

## 2021-09-03 LAB — T3, FREE: T3, Free: 3.7 pg/mL (ref 2.3–4.2)

## 2021-09-03 LAB — T4, FREE: Free T4: 1.22 ng/dL (ref 0.60–1.60)

## 2021-09-03 LAB — MAGNESIUM: Magnesium: 2 mg/dL (ref 1.5–2.5)

## 2021-09-03 LAB — LDL CHOLESTEROL, DIRECT: Direct LDL: 98 mg/dL

## 2021-09-03 NOTE — Assessment & Plan Note (Signed)
Following with psychiatrist q 3-4 months.

## 2021-09-03 NOTE — Assessment & Plan Note (Signed)
We discussed possible etiologies. Some of her chronic medical problems and medications can be contributing factors. Further recommendations will be given according to lab results.

## 2021-09-03 NOTE — Assessment & Plan Note (Signed)
Continue levothyroxine 112 mcg daily. Further recommendations according to TSH result. Endocrinology referral placed as requested.

## 2021-09-03 NOTE — Patient Instructions (Addendum)
A few things to remember from today's visit:  Vitamin D deficiency - Plan: VITAMIN D 25 Hydroxy (Vit-D Deficiency, Fractures), Parathyroid hormone, intact (no Ca)  B12 deficiency - Plan: Vitamin B12  Hyperlipidemia, mixed - Plan: Lipid panel, Comprehensive metabolic panel  Postsurgical hypothyroidism - Plan: Ambulatory referral to Endocrinology, Magnesium, T3, free, T4, free, TSH  Chronic fatigue  Routine general medical examination at a health care facility  Patient request for diagnostic testing - Plan: Magnesium   Do not use My Chart to request refills or for acute issues that need immediate attention.   Please be sure medication list is accurate. If a new problem present, please set up appointment sooner than planned today. You need shingles and Tdap vaccines, you can have these at your pharmacy. Arrange your mammogram and appt with your gynecologist.

## 2021-09-03 NOTE — Assessment & Plan Note (Signed)
Continue current dose of vit D supplementation. Further recommendations according to 25 OH vit D result. 

## 2021-09-03 NOTE — Assessment & Plan Note (Signed)
On daily multivitamin. Further recommendations according to B12 results.

## 2021-09-03 NOTE — Assessment & Plan Note (Signed)
Non pharmacologic treatment recommended for now. Further recommendations will be given according to 10 years CVD risk score and lipid panel numbers. 

## 2021-09-04 LAB — PARATHYROID HORMONE, INTACT (NO CA): PTH: 47 pg/mL (ref 16–77)

## 2021-09-30 DIAGNOSIS — F29 Unspecified psychosis not due to a substance or known physiological condition: Secondary | ICD-10-CM | POA: Diagnosis not present

## 2021-10-02 ENCOUNTER — Other Ambulatory Visit (INDEPENDENT_AMBULATORY_CARE_PROVIDER_SITE_OTHER): Payer: Medicare PPO

## 2021-10-02 DIAGNOSIS — E890 Postprocedural hypothyroidism: Secondary | ICD-10-CM

## 2021-10-02 DIAGNOSIS — R739 Hyperglycemia, unspecified: Secondary | ICD-10-CM

## 2021-10-02 LAB — HEMOGLOBIN A1C: Hgb A1c MFr Bld: 6.9 % — ABNORMAL HIGH (ref 4.6–6.5)

## 2021-10-02 LAB — TSH: TSH: 1.75 u[IU]/mL (ref 0.35–5.50)

## 2021-10-03 ENCOUNTER — Encounter: Payer: Self-pay | Admitting: Family Medicine

## 2021-10-03 DIAGNOSIS — E119 Type 2 diabetes mellitus without complications: Secondary | ICD-10-CM

## 2021-10-07 ENCOUNTER — Other Ambulatory Visit: Payer: Self-pay

## 2021-10-07 MED ORDER — ONETOUCH ULTRA VI STRP: ORAL_STRIP | 3 refills | Status: DC

## 2021-10-07 MED ORDER — ONETOUCH ULTRA 2 W/DEVICE KIT: PACK | 0 refills | Status: DC

## 2021-10-07 MED ORDER — ONETOUCH ULTRASOFT LANCETS MISC: 3 refills | Status: DC

## 2021-10-08 ENCOUNTER — Encounter: Payer: Self-pay | Admitting: Family Medicine

## 2021-10-08 ENCOUNTER — Telehealth (INDEPENDENT_AMBULATORY_CARE_PROVIDER_SITE_OTHER): Payer: Medicare PPO | Admitting: Family Medicine

## 2021-10-08 VITALS — Ht 60.0 in

## 2021-10-08 DIAGNOSIS — R7401 Elevation of levels of liver transaminase levels: Secondary | ICD-10-CM | POA: Diagnosis not present

## 2021-10-08 DIAGNOSIS — R42 Dizziness and giddiness: Secondary | ICD-10-CM

## 2021-10-08 DIAGNOSIS — R5382 Chronic fatigue, unspecified: Secondary | ICD-10-CM | POA: Diagnosis not present

## 2021-10-08 DIAGNOSIS — E782 Mixed hyperlipidemia: Secondary | ICD-10-CM | POA: Diagnosis not present

## 2021-10-08 DIAGNOSIS — E1169 Type 2 diabetes mellitus with other specified complication: Secondary | ICD-10-CM

## 2021-10-08 MED ORDER — METFORMIN HCL 500 MG PO TABS: 500.0000 mg | ORAL_TABLET | Freq: Two times a day (BID) | ORAL | 1 refills | Status: DC

## 2021-10-08 MED ORDER — PRAVASTATIN SODIUM 20 MG PO TABS: 20.0000 mg | ORAL_TABLET | Freq: Every day | ORAL | 0 refills | Status: DC

## 2021-10-08 NOTE — Assessment & Plan Note (Signed)
She agrees with trying pravastatin 20 mg daily, we discussed some side effects. Because she is also started metformin, recommend waiting until she knows metformin is being well-tolerated. ALT 6 weeks after starting statin medication. Continue low-fat diet.

## 2021-10-08 NOTE — Progress Notes (Signed)
Virtual Visit via Video Note I connected with [name] on 10/08/21 by a video enabled telemedicine application and verified that I am speaking with the correct person using two identifiers.  Location patient: home Location provider:work office Persons participating in the virtual visit: patient, wife,provider  I discussed the limitations of evaluation and management by telemedicine and the availability of in person appointments. The patient expressed understanding and agreed to proceed.  Chief Complaint  Patient presents with   Dizziness   HPI: [name] is a 61 yo female with hx of vitamin D deficiency, post surgical hypothyroidism, schizophrenia disorder bipolar type, gastroparesis, GERD, hyperlipidemia, and IBS c/o dizziness and seems to be worse for at least a couple weeks. She has had dizziness in the past, for which she has seen neuro, but states that this time it is different. She feels "out of it" it is "hard to think straight." She follows with psychiatrist regularly, states that he knows about this problem. No new medications. No recent falls.  Negative for fever, chills, unusual headache, CP, dyspnea, palpitations, abdominal pain, nausea, vomiting, changes in bowel habits, urinary symptoms, or a skin rash. Right ear does "pop" intermittently but she has not noted changes in hearing or tinnitus.  "No energy" , which has been worse since undergoing total thyroidectomy. Currently she is on levothyroxine 112 mcg daily. Sleeps about 5 to 7 hours, sometimes she feels rested in the morning when she first gets up and after taking naps throughout the day. Lab Results  Component Value Date   TSH 1.75 10/02/2021   + Blurry vision, constant for the past month or so.  Negative for conjunctival erythema, eye drainage, or pain. Last eye exam over a year ago.  Recently Dx'ed with DM II, she is concerned about elevated BS's and would like to start pharmacologic treatment. She  started checking BS 4 days ago, 2 hours after supper 235 and fasting BS 140s. Component     Latest Ref Rng & Units 11/28/2019 08/18/2020 08/29/2020 10/02/2021  Hemoglobin A1C     4.6 - 6.5 % 5.5 5.2 5.0 6.9 (H)   + Thirst, which she has been having for about 3 years, wonders why she did not have blood sugar checked before. Referral for nutrition and diabetes education has been placed, she has an appointment in 10/2021.  She has an appointment with endocrinologist later this month.  HLD, she is not on pharmacologic treatment. Lab Results  Component Value Date   CHOL 164 09/03/2021   HDL 45.00 09/03/2021   LDLCALC 54 05/15/2017   LDLDIRECT 98.0 09/03/2021   TRIG 215.0 (H) 09/03/2021   CHOLHDL 4 09/03/2021   Lab Results  Component Value Date   ALT 58 (H) 09/03/2021   AST 33 09/03/2021   ALKPHOS 86 09/03/2021   BILITOT 0.7 09/03/2021   ROS: See pertinent positives and negatives per HPI.  Past Medical History:  Diagnosis Date   Allergic rhinitis 03/26/2007   Anginal pain ([place]) 2001   Autonomic dysfunction 01/11/2015   Bipolar 1 disorder    Hx of psychosis; present since age 40   Chronic fatigue 06/21/2017   Community acquired pneumonia of right lung 57/08/7791   Complication of anesthesia    agitation   Costochondritis 02/14/2014   Diverticular disease of colon 08/06/2020   Double vision 03/15/2011   Dyspnea on effort 01/10/2011   Dysuria 02/15/2013   Elevated lactic acid level    Endometriosis 05/11/2008   Fatty liver disease, nonalcoholic 90/30/0923  Frequency of urination    Gastroesophageal reflux disease 08/06/2020   Headache    History of duodenal ulcer    History of kidney stones    History of lithium toxicity 05/01/2014   Hyperglycemia 01/10/2011   Hyperlipidemia, mixed 05/12/2017   Hyperprolactinemia 06/26/2015   Insomnia 05/12/2017   Irritable bowel syndrome 08/06/2020   Jaw pain 02/13/2012   poss tmj area     Leukocytosis 05/01/2014   Multinodular  goiter 05/12/2017   Nocturia    Nondiabetic gastroparesis 01/11/2015   Pelvic pain    Plantar fasciitis 06/11/2007   Pneumonia 08/20/2020   Pruritus ani 08/06/2020   Schizoaffective disorder, bipolar type    Unclear if this is not better accounted for by longstanding history of bipolar disorder   Social anxiety disorder    Subacute bronchitis 07/10/2018   Thyroid nodule 02/02/2017   Urgency of urination    Vitamin D deficiency 09/23/2011    Past Surgical History:  Procedure Laterality Date   CARDIAC CATHETERIZATION  10-09-1999  DR KATZ   NORMAL LVF/  NORMAL RCA/  NO CRITICAL DISEASE LEFT CORONARY SYSTEM   CARDIOVASCULAR STRESS TEST  01-23-2011   NORMAL NUCLEAR STUDY/ NO ISCHEMIA/ EF 81%   CYSTOSCOPY WITH BIOPSY N/A 06/03/2013   Procedure: CYSTOSCOPY WITH BIOPSY  INSTILLATION OF MARCAINE AND PYRIDIUM;  Surgeon: Hanley Ben, MD;  Location: [facility];  Service: Urology;  Laterality: N/A;   LAPAROSCOPIC CHOLECYSTECTOMY  11-08-2001   LAPAROSCOPIC LEFT SALPINGOOPHORECTOMY AND LYSYS ADHESIONS  03-10-2002   LAPAROSCOPIC REMOVAL OVARY REMNANT  2003   [place]   LAPAROSCOPY N/A 12/14/2012   Procedure: LAPAROSCOPY DIAGNOSTIC;  Surgeon: Stark Klein, MD;  Location: [facility];  Service: General;  Laterality: N/A;   THYROIDECTOMY N/A 05/24/2021   Procedure: TOTAL THYROIDECTOMY;  Surgeon: Armandina Gemma, MD;  Location: [facility];  Service: General;  Laterality: N/A;   TOTAL ABDOMINAL HYSTERECTOMY  2000   W/ RIGHT SALPINGOOPHORECTOMY   TRANSTHORACIC ECHOCARDIOGRAM  10-13-2010   NORMAL LVF/  EF 55-60%    Family History  Problem Relation Age of Onset   Sleep apnea Father    Depression Father    Alcohol abuse Father    Hypertension Father    Migraines Sister        headaches   Anxiety disorder Sister    Other Mother        MAC infection   Anxiety disorder Mother    Dementia Mother        likely Alzheimer's disease; symptom onset on late 70s/early 80s   Heart disease  Paternal Grandmother    Hyperlipidemia Paternal Grandmother    Alcohol abuse Paternal Grandfather    Diabetes Neg Hx     Social History   Socioeconomic History   Marital status: Married    Spouse name: Not on file   Number of children: 0   Years of education: 16   Highest education level: Bachelor's degree (e.g., BA, AB, BS)  Occupational History   Occupation: Disability    Comment: Nurse  Tobacco Use   Smoking status: Former    Packs/day: 0.01    Years: 1.00    Pack years: 0.01    Types: Cigarettes    Quit date: 1980    Years since quitting: 43.1   Smokeless tobacco: Never   Tobacco comments:    ONLY SMOKED FOR 6 MONTHS --  QUIT  YRS AGO  Vaping Use   Vaping Use: Never used  Substance and Sexual Activity  Alcohol use: Yes    Comment: occ   Drug use: Never   Sexual activity: Not on file  Other Topics Concern   Not on file  Social History Narrative   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    no tobacco   Caffeine Use: very little, three times a week   Left handed    Social Determinants of Health   Financial Resource Strain: Low Risk    Difficulty of Paying Living Expenses: Not hard at all  Food Insecurity: No Food Insecurity   Worried About Charity fundraiser in the Last Year: Never true   Ran Out of Food in the Last Year: Never true  Transportation Needs: No Transportation Needs   Lack of Transportation (Medical): No   Lack of Transportation (Non-Medical): No  Physical Activity: Sufficiently Active   Days of Exercise per Week: 7 days   Minutes of Exercise per Session: 30 min  Stress: No Stress Concern Present   Feeling of Stress : Not at all  Social Connections: Moderately Integrated   Frequency of Communication with Friends and Family: Once a week   Frequency of Social Gatherings with Friends and Family: Twice a week   Attends  Religious Services: 1 to 4 times per year   Active Member of [employer] or Organizations: No   Attends Music therapist: Never   Marital Status: Married  Human resources officer Violence: Not At Risk   Fear of Current or Ex-Partner: No   Emotionally Abused: No   Physically Abused: No   Sexually Abused: No   Current Outpatient Medications:    acetaminophen (TYLENOL) 325 MG tablet, Take 650 mg by mouth every 6 (six) hours as needed for mild pain, fever or headache., Disp: , Rfl:    aspirin-acetaminophen-caffeine (EXCEDRIN MIGRAINE) 250-250-65 MG tablet, Take 2 tablets by mouth every 6 (six) hours as needed for headache., Disp: , Rfl:    Blood Glucose Monitoring Suppl (ONE TOUCH ULTRA 2) w/Device KIT, Use to test blood sugars 1-2 times daily., Disp: 1 kit, Rfl: 0   buPROPion (WELLBUTRIN XL) 150 MG 24 hr tablet, Take 450 mg by mouth every morning., Disp: , Rfl:    calcium carbonate (TUMS) 500 MG chewable tablet, Chew 2 tablets (400 mg of elemental calcium total) by mouth 3 (three) times daily., Disp: 90 tablet, Rfl: 1   divalproex (DEPAKOTE ER) 250 MG 24 hr tablet, Take 750 mg by mouth at bedtime., Disp: , Rfl:    glucose blood (ONETOUCH ULTRA) test strip, Use to test blood sugars 1-2 times daily., Disp: 200 each, Rfl: 3   Lancets (ONETOUCH ULTRASOFT) lancets, Use to test blood sugars 1-2 times daily., Disp: 200 each, Rfl: 3   levothyroxine (SYNTHROID) 112 MCG tablet, Take 112 mcg by mouth every morning., Disp: , Rfl:    LORazepam (ATIVAN) 0.5 MG tablet, Take 0.5-1 mg by mouth daily as needed for anxiety., Disp: , Rfl:    Multiple Vitamin (MULTIVITAMIN WITH MINERALS) TABS tablet, Take 1 tablet by mouth daily., Disp: , Rfl:    albuterol (VENTOLIN HFA) 108 (90 Base) MCG/ACT inhaler, Inhale 2 puffs into the lungs every 6 (six) hours as needed for wheezing or shortness of breath., Disp: 18 g, Rfl: 1  EXAM:  VITALS per patient if applicable:Ht 5' (1.324 m)    BMI 30.88 kg/m   GENERAL: alert,  oriented, appears well and in no acute distress  HEENT: atraumatic, conjunctiva clear, no obvious abnormalities on inspection of external nose and ears  NECK: normal movements of the head and neck  LUNGS: on inspection no signs of respiratory distress, breathing rate appears normal, no obvious gross SOB, gasping or wheezing  CV: no obvious cyanosis  MS: moves all visible extremities without noticeable abnormality  PSYCH/NEURO: pleasant and cooperative, no obvious depression, + anxiety. Speech and thought processing grossly intact  ASSESSMENT AND PLAN:  Discussed the following assessment and plan:  Dizziness Chronic?, she has been evaluated for dizziness a few times in the past, since 2010 when reviewing some of records. We reviewed possible causes, meds can aggravate problem. I do not think we need blood work or imaging at this time. For now I recommend fall precautions. Monitor for new symptoms. In the past she has been evaluated by neuro and ENT. Brain MRI done on 11/07/20 was negative for acute or reversible process.  Mild to moderate chronic small vessel ischemic changes of the cerebral hemispheric white matter.  Instructed about warning signs.  Chronic fatigue We discussed possible etiologies, some of her medications and chronic medical problems could be contributing factors. I do not think we need to repeat labs at this time. A healthier diet and regular physical activity may help. We will continue following. She has had sleep study done in the past and it was not conclusive for OSA.  Elevated ALT measurement - Plan: ALT Hx of fatty liver and abnormal LFT's. NAFLD score <=0.67 (indeterminate score). Low fat diet and wt loss will help. ALT to be repeated 6 weeks after starting Pravastatin. Hyperlipidemia, mixed  Hyperlipidemia, mixed She agrees with trying pravastatin 20 mg daily, we discussed some side effects. Because she is also started metformin, recommend waiting  until she knows metformin is being well-tolerated. ALT 6 weeks after starting statin medication. Continue low-fat diet.  Type 2 diabetes mellitus with other specified complication ([place]) We discussed diagnostic criteria. Hemoglobin A1c has been checked annually, fluctuating between 5.0-5.5 since 2021, It was 6.0 about 7 years ago. She has had intermittent episodes of hyperglycemia. We discussed treatment options, she would like to start medication. Metformin or Jardiance are some to consider, we discussed side effects.  She agrees with trying metformin 500 mg twice daily with food. She will continue following with endocrinologist. Recommend arranging an eye exam. Continue monitoring BS, daily fasting and a few times per week 2 hours postprandial. Pending appointment for nutrition/diabetes education.   We discussed possible serious and likely etiologies, options for evaluation and workup, limitations of telemedicine visit vs in person visit, treatment, treatment risks and precautions. The patient was advised to call back or seek an in-person evaluation if the symptoms worsen or if the condition fails to improve as anticipated. I discussed the assessment and treatment plan with the patient. The patient was provided an opportunity to ask questions and all were answered. The patient agreed with the plan and demonstrated an understanding of the instructions.  I spent a total of 40 minutes in both face to face (video) and non face to face activities for this visit on the date of this encounter. During this time history was obtained and documented, examination was performed, prior labs/imaging reviewed, and assessment/plan discussed.  Return if symptoms worsen or fail to improve, for Keep next appt..  Chezney Huether G. [name], MD  [facility]. [place] office.

## 2021-10-08 NOTE — Assessment & Plan Note (Addendum)
We discussed diagnostic criteria. Hemoglobin A1c has been checked annually, fluctuating between 5.0-5.5 since 2021, It was 6.0 about 7 years ago. She has had intermittent episodes of hyperglycemia. We discussed treatment options, she would like to start medication. Metformin or Jardiance are some to consider, we discussed side effects.  She agrees with trying metformin 500 mg twice daily with food. She will continue following with endocrinologist. Recommend arranging an eye exam. Continue monitoring BS, daily fasting and a few times per week 2 hours postprandial. Pending appointment for nutrition/diabetes education.

## 2021-10-10 ENCOUNTER — Encounter: Payer: Self-pay | Admitting: Internal Medicine

## 2021-10-10 ENCOUNTER — Ambulatory Visit (INDEPENDENT_AMBULATORY_CARE_PROVIDER_SITE_OTHER): Payer: Medicare PPO | Admitting: Internal Medicine

## 2021-10-10 ENCOUNTER — Other Ambulatory Visit: Payer: Self-pay

## 2021-10-10 VITALS — BP 124/80 | HR 80 | Ht 60.0 in | Wt 161.2 lb

## 2021-10-10 DIAGNOSIS — E890 Postprocedural hypothyroidism: Secondary | ICD-10-CM

## 2021-10-10 DIAGNOSIS — D497 Neoplasm of unspecified behavior of endocrine glands and other parts of nervous system: Secondary | ICD-10-CM

## 2021-10-10 DIAGNOSIS — E1169 Type 2 diabetes mellitus with other specified complication: Secondary | ICD-10-CM | POA: Diagnosis not present

## 2021-10-10 DIAGNOSIS — E119 Type 2 diabetes mellitus without complications: Secondary | ICD-10-CM | POA: Insufficient documentation

## 2021-10-10 DIAGNOSIS — D440 Neoplasm of uncertain behavior of thyroid gland: Secondary | ICD-10-CM

## 2021-10-10 HISTORY — DX: Type 2 diabetes mellitus without complications: E11.9

## 2021-10-10 HISTORY — DX: Neoplasm of uncertain behavior of thyroid gland: D44.0

## 2021-10-10 HISTORY — DX: Neoplasm of unspecified behavior of endocrine glands and other parts of nervous system: D49.7

## 2021-10-10 MED ORDER — LEVOTHYROXINE SODIUM 100 MCG PO TABS: 100.0000 ug | ORAL_TABLET | Freq: Every day | ORAL | 3 refills | Status: DC

## 2021-10-10 MED ORDER — METFORMIN HCL 500 MG PO TABS: 500.0000 mg | ORAL_TABLET | Freq: Two times a day (BID) | ORAL | 2 refills | Status: DC

## 2021-10-10 NOTE — Patient Instructions (Addendum)
°  Continue Metformin 500 mg, 1 tablet twice daily  Switch Levothyroxine to 100 mcg daily  ( in the meantime you may use 112 mcg Monday through Saturday )       Brisk walking 150-175 minutes a week    You are on levothyroxine - which is your thyroid hormone supplement. You MUST take this consistently.  You should take this first thing in the morning on an empty stomach with water. You should not take it with other medications. Wait 65min to 1hr prior to eating. If you are taking any vitamins - please take these in the evening.   If you miss a dose, please take your missed dose the following day (double the dose for that day). You should have a pill box for ONLY levothyroxine on your bedside table to help you remember to take your medications.

## 2021-10-10 NOTE — Progress Notes (Signed)
Name: [name]  MRN/ DOB: [id number], 1960-12-07   Age/ Sex: 61 y.o., female    PCP: [name], [name], MD   Reason for Endocrinology Evaluation: Type 2 Diabetes Mellitus     Date of Initial Endocrinology Visit: 10/10/2021     PATIENT IDENTIFIER: [name] is a 61 y.o. female with a past medical history of T2DM, dyslipidemia hypothyroidism. The patient presented for initial endocrinology clinic visit on 10/10/2021 for consultative assistance with her diabetes management.    HPI: [name] is here with her wife Drue Dun    Diagnosed with DM 09/2021 Currently checking blood sugars 1-2 x / day.  Hypoglycemia episodes : no              Hemoglobin A1c has ranged from 5.0% in 2022, peaking at 6.9% in 2023. Patient required assistance for hypoglycemia: no  Patient has required hospitalization within the last 1 year from hyper or hypoglycemia: no  In terms of diet, the patient 3 meals a day, snacks once.   Has stopped sugar-sweetened beverages    Denies nausea, vomiting or diarrhea  She walks the dog daily   THYROID HISTORY :  She is S/P total thyroidectomy due to enlarging MNG with pathology report consistent with NIFTP on 05/24/2021  Father with thyroid cancer   She has noted with weight gain  She is tired  Doesn't sleep well at night    On disability for Bipolar d/o   HOME ENDOCRINE REGIMEN: Metformin 500 mg BID  Levothyroxine 112 mcg , half a tablet on Tuesdays and Thursday and 1 tablet rest of the week      Statin: yes ACE-I/ARB: no Prior Diabetic Education: has a pending appointment    METER DOWNLOAD SUMMARY:  118- 235 mg/dL      DIABETIC COMPLICATIONS: Microvascular complications:   Denies: CKD, neuropathy  Last eye exam: Completed a while ago   Macrovascular complications:   Denies: CAD, PVD, CVA   PAST HISTORY: Past Medical History:  Past Medical History:  Diagnosis Date   Allergic rhinitis 03/26/2007   Anginal pain  ([place]) 2001   Autonomic dysfunction 01/11/2015   Bipolar 1 disorder    Hx of psychosis; present since age 23   Chronic fatigue 06/21/2017   Community acquired pneumonia of right lung 36/14/4315   Complication of anesthesia    agitation   Costochondritis 02/14/2014   Diverticular disease of colon 08/06/2020   Double vision 03/15/2011   Dyspnea on effort 01/10/2011   Dysuria 02/15/2013   Elevated lactic acid level    Endometriosis 05/11/2008   Fatty liver disease, nonalcoholic 40/03/6760   Frequency of urination    Gastroesophageal reflux disease 08/06/2020   Headache    History of duodenal ulcer    History of kidney stones    History of lithium toxicity 05/01/2014   Hyperglycemia 01/10/2011   Hyperlipidemia, mixed 05/12/2017   Hyperprolactinemia 06/26/2015   Insomnia 05/12/2017   Irritable bowel syndrome 08/06/2020   Jaw pain 02/13/2012   poss tmj area     Leukocytosis 05/01/2014   Multinodular goiter 05/12/2017   Nocturia    Nondiabetic gastroparesis 01/11/2015   Pelvic pain    Plantar fasciitis 06/11/2007   Pneumonia 08/20/2020   Pruritus ani 08/06/2020   Schizoaffective disorder, bipolar type    Unclear if this is not better accounted for by longstanding history of bipolar disorder   Social anxiety disorder    Subacute bronchitis 07/10/2018   Thyroid nodule 02/02/2017  Urgency of urination    Vitamin D deficiency 09/23/2011   Past Surgical History:  Past Surgical History:  Procedure Laterality Date   CARDIAC CATHETERIZATION  10-09-1999  DR Ron Parker   NORMAL LVF/  NORMAL RCA/  NO CRITICAL DISEASE LEFT CORONARY SYSTEM   CARDIOVASCULAR STRESS TEST  01-23-2011   NORMAL NUCLEAR STUDY/ NO ISCHEMIA/ EF 81%   CYSTOSCOPY WITH BIOPSY N/A 06/03/2013   Procedure: CYSTOSCOPY WITH BIOPSY  INSTILLATION OF MARCAINE AND PYRIDIUM;  Surgeon: Hanley Ben, MD;  Location: [facility];  Service: Urology;  Laterality: N/A;   LAPAROSCOPIC CHOLECYSTECTOMY  11-08-2001    LAPAROSCOPIC LEFT SALPINGOOPHORECTOMY AND LYSYS ADHESIONS  03-10-2002   LAPAROSCOPIC REMOVAL OVARY REMNANT  2003   [place]   LAPAROSCOPY N/A 12/14/2012   Procedure: LAPAROSCOPY DIAGNOSTIC;  Surgeon: Stark Klein, MD;  Location: [facility];  Service: General;  Laterality: N/A;   THYROIDECTOMY N/A 05/24/2021   Procedure: TOTAL THYROIDECTOMY;  Surgeon: Armandina Gemma, MD;  Location: [facility];  Service: General;  Laterality: N/A;   TOTAL ABDOMINAL HYSTERECTOMY  2000   W/ RIGHT SALPINGOOPHORECTOMY   TRANSTHORACIC ECHOCARDIOGRAM  10-13-2010   NORMAL LVF/  EF 55-60%    Social History:  reports that she quit smoking about 43 years ago. Her smoking use included cigarettes. She has a 0.01 pack-year smoking history. She has never used smokeless tobacco. She reports current alcohol use. She reports that she does not use drugs. Family History:  Family History  Problem Relation Age of Onset   Sleep apnea Father    Depression Father    Alcohol abuse Father    Hypertension Father    Migraines Sister        headaches   Anxiety disorder Sister    Other Mother        MAC infection   Anxiety disorder Mother    Dementia Mother        likely Alzheimer's disease; symptom onset on late 70s/early 80s   Heart disease Paternal Grandmother    Hyperlipidemia Paternal Grandmother    Alcohol abuse Paternal Grandfather    Diabetes Neg Hx      HOME MEDICATIONS: Allergies as of 10/10/2021       Reactions   Morphine And Related Nausea And Vomiting, Nausea Only   Ambien [zolpidem] Other (See Comments)   Per patient this caused her to fall   Duloxetine Swelling, Other (See Comments)   Other reaction(s): SWELLING/EDEMA   Latuda [lurasidone Hcl]    Morphine Nausea Only        Medication List        Accurate as of October 10, 2021  3:20 PM. If you have any questions, ask your nurse or doctor.          acetaminophen 325 MG tablet Commonly known as: TYLENOL Take 650 mg by mouth every 6 (six) hours as  needed for mild pain, fever or headache.   albuterol 108 (90 Base) MCG/ACT inhaler Commonly known as: VENTOLIN HFA Inhale 2 puffs into the lungs every 6 (six) hours as needed for wheezing or shortness of breath.   aspirin-acetaminophen-caffeine 250-250-65 MG tablet Commonly known as: EXCEDRIN MIGRAINE Take 2 tablets by mouth every 6 (six) hours as needed for headache.   buPROPion 150 MG 24 hr tablet Commonly known as: WELLBUTRIN XL Take 450 mg by mouth every morning.   calcium carbonate 500 MG chewable tablet Commonly known as: Tums Chew 2 tablets (400 mg of elemental calcium total) by mouth 3 (three)  times daily.   divalproex 250 MG 24 hr tablet Commonly known as: DEPAKOTE ER Take 750 mg by mouth at bedtime.   levothyroxine 100 MCG tablet Commonly known as: SYNTHROID Take 1 tablet (100 mcg total) by mouth daily. What changed:  medication strength how much to take when to take this additional instructions Changed by: Dorita Sciara, MD   LORazepam 0.5 MG tablet Commonly known as: ATIVAN Take 0.5-1 mg by mouth daily as needed for anxiety.   metFORMIN 500 MG tablet Commonly known as: GLUCOPHAGE Take 1 tablet (500 mg total) by mouth 2 (two) times daily with a meal.   multivitamin with minerals Tabs tablet Take 1 tablet by mouth daily.   ONE TOUCH ULTRA 2 w/Device Kit Use to test blood sugars 1-2 times daily.   OneTouch Ultra test strip Generic drug: glucose blood Use to test blood sugars 1-2 times daily.   onetouch ultrasoft lancets Use to test blood sugars 1-2 times daily.   pravastatin 20 MG tablet Commonly known as: PRAVACHOL Take 1 tablet (20 mg total) by mouth daily.         ALLERGIES: Allergies  Allergen Reactions   Morphine And Related Nausea And Vomiting and Nausea Only   Ambien [Zolpidem] Other (See Comments)    Per patient this caused her to fall   Duloxetine Swelling and Other (See Comments)    Other reaction(s): SWELLING/EDEMA    Latuda [Lurasidone Hcl]    Morphine Nausea Only     REVIEW OF SYSTEMS: A comprehensive ROS was conducted with the patient and is negative except as per HPI and below:  ROS    OBJECTIVE:   VITAL SIGNS: BP 124/80 (BP Location: Left Arm, Patient Position: Sitting, Cuff Size: Small)    Pulse 80    Ht 5' (1.524 m)    Wt 161 lb 3.2 oz (73.1 kg)    SpO2 98%    BMI 31.48 kg/m    PHYSICAL EXAM:  General: Pt appears well and is in NAD  Neck: General: Supple without adenopathy or carotid bruits. Thyroid: Thyroid size normal.  No goiter or nodules appreciated.   Lungs: Clear with good BS bilat with no rales, rhonchi, or wheezes  Heart: RRR   Abdomen: Normoactive bowel sounds, soft, nontender, without masses or organomegaly palpable  Extremities:  Lower extremities - No pretibial edema. No lesions.  Neuro: MS is good with appropriate affect, pt is alert and Ox3    DM foot exam: 10/10/2021  The skin of the feet is intact without sores or ulcerations. The pedal pulses are 2+ on right and 2+ on left. The sensation is intact to a screening 5.07, 10 gram monofilament bilaterally  DATA REVIEWED:  Lab Results  Component Value Date   HGBA1C 6.9 (H) 10/02/2021   HGBA1C 5.0 08/29/2020   HGBA1C 5.2 08/18/2020   Lab Results  Component Value Date   LDLCALC 54 05/15/2017   CREATININE 0.82 09/03/2021   No results found for: [facility]  Lab Results  Component Value Date   CHOL 164 09/03/2021   HDL 45.00 09/03/2021   LDLCALC 54 05/15/2017   LDLDIRECT 98.0 09/03/2021   TRIG 215.0 (H) 09/03/2021   CHOLHDL 4 09/03/2021        Latest Reference Range & Units 09/03/21 09:36  Sodium 135 - 145 mEq/L 140  Potassium 3.5 - 5.1 mEq/L 4.1  Chloride 96 - 112 mEq/L 107  CO2 19 - 32 mEq/L 22  Glucose 70 - 99 mg/dL 142 (H)  BUN 6 - 23 mg/dL 16  Creatinine 0.40 - 1.20 mg/dL 0.82  Calcium 8.4 - 10.5 mg/dL 9.5  Magnesium 1.5 - 2.5 mg/dL 2.0  Alkaline Phosphatase 39 - 117 U/L 86  Albumin 3.5 - 5.2  g/dL 4.1     Latest Reference Range & Units 09/03/21 09:36  Total CHOL/HDL Ratio  4  Cholesterol 0 - 200 mg/dL 164  HDL Cholesterol >39.00 mg/dL 45.00  Direct LDL mg/dL 98.0  NonHDL  118.80  Triglycerides 0.0 - 149.0 mg/dL 215.0 (H)  VLDL 0.0 - 40.0 mg/dL 43.0 (H)    Latest Reference Range & Units 09/03/21 09:36  VITD 30.00 - 100.00 ng/mL 31.93  Vitamin B12 211 - 911 pg/mL 674      Thyroid Pathology 05/24/2021  FINAL MICROSCOPIC DIAGNOSIS:   A. THYROID, TOTAL THYROIDECTOMY:  -  Noninvasive follicular thyroid neoplasm with papillary-like nuclear  features (NIFTP, 0.15 cm, left)  -  Follicular adenoma (5.1 cm, right)    ASSESSMENT / PLAN / RECOMMENDATIONS:   1) Type 2 Diabetes Mellitus, optimally controlled, With out complications - Most recent A1c of 6.9%. Goal A1c < 7.0 %.    Plan: GENERAL: I have discussed with the patient the pathophysiology of diabetes. We went over the natural progression of the disease. We talked about both insulin resistance and insulin deficiency. We stressed the importance of lifestyle changes including diet and exercise. I explained the complications associated with diabetes including retinopathy, nephropathy, neuropathy as well as increased risk of cardiovascular disease. We went over the benefit seen with glycemic control.   I explained to the patient that diabetic patients are at higher than normal risk for amputations. She has an appointment to meet with RD next month We discussed pharmacokinetics of metformin She is tolerating it well and no changes will be made today  MEDICATIONS: Continue metformin 500 mg twice daily  EDUCATION / INSTRUCTIONS: BG monitoring instructions: Patient is instructed to check her blood sugars 1 times a day, fasting. Call [facility] if: BG persistently < 70  I reviewed the Rule of 15 for the treatment of hypoglycemia in detail with the patient. Literature supplied.   2) Diabetic  complications:  Eye: Does not have known diabetic retinopathy.  Patient urged to have an eye exam Neuro/ Feet: Does not have known diabetic peripheral neuropathy. Renal: Patient does not have known baseline CKD. She is not on an ACEI/ARB at present.    3)Postoperative Hypothyroidism :  -Patient with fatigue and weight gain -She is currently taking half a tablet on Tuesdays and Thursdays, and 1 tablet the rest of the week -I will change this as below to make it easier for her to take - Pt educated extensively on the correct way to take levothyroxine (first thing in the morning with water, 30 minutes before eating or taking other medications). - Pt encouraged to double dose the following day if she were to miss a dose given long half-life of levothyroxine.  Medication Stop levothyroxine 112 mcg Start levothyroxine 100 mcg daily     5) NIFTP: - Surgical resection is curative  - NO indication for RAI ablation nor TSH suppression     Follow-up in 3 months  Signed electronically by: Mack Guise, MD  [facility]  [facility] [address] [place], [place] [address] Phone: [phone] FAX: [phone]   CC: [name], [name], [place] [address] Phone: [phone]  Fax: [phone]  Return to Endocrinology clinic as below: Future Appointments  Date Time Provider [place]  10/24/2021  5:15 PM [place] [facility] CORE CLASS 1 [place] NDM  10/31/2021  5:15 PM [place] DM CORE CLASS 2 [place] NDM  11/07/2021  5:15 PM [place] DM CORE CLASS 3 [place] NDM  12/11/2021  1:45 PM [facility]-NURSE HEALTH ADVISOR [facility]  01/07/2022  2:00 PM Dominico Rod, Melanie Crazier, MD [facility] None

## 2021-10-14 ENCOUNTER — Encounter: Payer: Self-pay | Admitting: Family Medicine

## 2021-10-24 ENCOUNTER — Ambulatory Visit: Payer: Medicare PPO

## 2021-10-30 DIAGNOSIS — F3131 Bipolar disorder, current episode depressed, mild: Secondary | ICD-10-CM | POA: Diagnosis not present

## 2021-10-31 ENCOUNTER — Ambulatory Visit: Payer: Medicare PPO

## 2021-11-07 ENCOUNTER — Ambulatory Visit: Payer: Medicare PPO

## 2021-11-13 NOTE — Progress Notes (Signed)
? ? ?ACUTE VISIT ?Chief Complaint  ?Patient presents with  ? Leg Pain  ?  Ongoing for about 6-12 months. Happens at night, she will wake up with pain on the outside of her leg. Sometimes she has to pick her leg up to get moving.   ? ?HPI: ?[name] is a 61 y.o. female with hx of postsurgical hypothyroidism,DM II,bipolar type schizophrenic disorder,and chronic fatigue here today with her wife complaining of bilateral LE pain, intermittent, and usually when lying down in bed. ?Pain is on lateral aspect of both knee,"intense deeper" pain she notices when she wakes up. ?Alleviated after walking a few steps. ?She feels like pain may be getting worse. ?She has not taken medication for pain. ? ?Negative for calves pain,edema or erythema. ? ?Today during auscultation noted slightly irregular HR.Negative for CP or palpitations. ?She wonders if this can explain fatigue, she feels "exhausted" at the end of the day.  ?SOB "sometimes", she feels like she is "out of shape." ?Negative for orthopnea or PND. ?She is reporting negative cardiac work up a few years ago. ?Lab Results  ?Component Value Date  ? TSH 1.75 10/02/2021  ? ?Lab Results  ?Component Value Date  ? WBC 5.8 05/08/2021  ? HGB 14.0 05/08/2021  ? HCT 43.5 05/08/2021  ? MCV 95.2 05/08/2021  ? PLT 256 05/08/2021  ? ?Review of Systems  ?Constitutional:  Positive for fatigue. Negative for activity change, appetite change and fever.  ?Respiratory:  Negative for cough and wheezing.   ?Gastrointestinal:  Negative for abdominal pain, nausea and vomiting.  ?Skin:  Negative for pallor and rash.  ?Neurological:  Negative for syncope and headaches.  ?Rest see pertinent positives and negatives per HPI. ? ?Current Outpatient Medications on File Prior to Visit  ?Medication Sig Dispense Refill  ? acetaminophen (TYLENOL) 325 MG tablet Take 650 mg by mouth every 6 (six) hours as needed for mild pain, fever or headache.    ? aspirin-acetaminophen-caffeine (EXCEDRIN MIGRAINE)  250-250-65 MG tablet Take 2 tablets by mouth every 6 (six) hours as needed for headache.    ? Blood Glucose Monitoring Suppl (ONE TOUCH ULTRA 2) w/Device KIT Use to test blood sugars 1-2 times daily. 1 kit 0  ? buPROPion (WELLBUTRIN XL) 150 MG 24 hr tablet Take 450 mg by mouth every morning.    ? calcium carbonate (TUMS) 500 MG chewable tablet Chew 2 tablets (400 mg of elemental calcium total) by mouth 3 (three) times daily. 90 tablet 1  ? divalproex (DEPAKOTE ER) 250 MG 24 hr tablet Take 750 mg by mouth at bedtime.    ? glucose blood (ONETOUCH ULTRA) test strip Use to test blood sugars 1-2 times daily. 200 each 3  ? Lancets (ONETOUCH ULTRASOFT) lancets Use to test blood sugars 1-2 times daily. 200 each 3  ? levothyroxine (SYNTHROID) 100 MCG tablet Take 1 tablet (100 mcg total) by mouth daily. 90 tablet 3  ? LORazepam (ATIVAN) 0.5 MG tablet Take 0.5-1 mg by mouth daily as needed for anxiety.    ? metFORMIN (GLUCOPHAGE) 500 MG tablet Take 1 tablet (500 mg total) by mouth 2 (two) times daily with a meal. 180 tablet 2  ? Multiple Vitamin (MULTIVITAMIN WITH MINERALS) TABS tablet Take 1 tablet by mouth daily.    ? pravastatin (PRAVACHOL) 20 MG tablet Take 1 tablet (20 mg total) by mouth daily. 90 tablet 0  ? albuterol (VENTOLIN HFA) 108 (90 Base) MCG/ACT inhaler Inhale 2 puffs into the lungs every 6 (  six) hours as needed for wheezing or shortness of breath. 18 g 1  ? ?No current facility-administered medications on file prior to visit.  ? ?Past Medical History:  ?Diagnosis Date  ? Allergic rhinitis 03/26/2007  ? Anginal pain ([place]) 2001  ? Autonomic dysfunction 01/11/2015  ? Bipolar 1 disorder   ? Hx of psychosis; present since age 3  ? Chronic fatigue 06/21/2017  ? Community acquired pneumonia of right lung 08/17/2020  ? Complication of anesthesia   ? agitation  ? Costochondritis 02/14/2014  ? Diverticular disease of colon 08/06/2020  ? Double vision 03/15/2011  ? Dyspnea on effort 01/10/2011  ? Dysuria 02/15/2013  ?  Elevated lactic acid level   ? Endometriosis 05/11/2008  ? Fatty liver disease, nonalcoholic 17/49/4496  ? Frequency of urination   ? Gastroesophageal reflux disease 08/06/2020  ? Headache   ? History of duodenal ulcer   ? History of kidney stones   ? History of lithium toxicity 05/01/2014  ? Hyperglycemia 01/10/2011  ? Hyperlipidemia, mixed 05/12/2017  ? Hyperprolactinemia 06/26/2015  ? Insomnia 05/12/2017  ? Irritable bowel syndrome 08/06/2020  ? Jaw pain 02/13/2012  ? poss tmj area    ? Leukocytosis 05/01/2014  ? Multinodular goiter 05/12/2017  ? Nocturia   ? Nondiabetic gastroparesis 01/11/2015  ? Pelvic pain   ? Plantar fasciitis 06/11/2007  ? Pneumonia 08/20/2020  ? Pruritus ani 08/06/2020  ? Schizoaffective disorder, bipolar type   ? Unclear if this is not better accounted for by longstanding history of bipolar disorder  ? Social anxiety disorder   ? Subacute bronchitis 07/10/2018  ? Thyroid nodule 02/02/2017  ? Urgency of urination   ? Vitamin D deficiency 09/23/2011  ? ?Allergies  ?Allergen Reactions  ? Morphine And Related Nausea And Vomiting and Nausea Only  ? Ambien [Zolpidem] Other (See Comments)  ?  Per patient this caused her to fall  ? Duloxetine Swelling and Other (See Comments)  ?  Other reaction(s): SWELLING/EDEMA  ? Anette Guarneri [Lurasidone Hcl]   ? Morphine Nausea Only  ? Shingrix [Zoster Vac Recomb Adjuvanted]   ?  Dizziness ?vomiting  ? ?Social History  ? ?Socioeconomic History  ? Marital status: Married  ?  Spouse name: Not on file  ? Number of children: 0  ? Years of education: 29  ? Highest education level: Bachelor's degree (e.g., BA, AB, BS)  ?Occupational History  ? 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 Smoking status: Former  ?  Packs/day: 0.01  ?  Years: 1.00  ?  Pack years: 0.01  ?  Types: Cigarettes  ?  Quit date: 70  ?  Years since quitting: 43.2  ? Smokeless tobacco: Never  ? Tobacco comments:  ?  ONLY SMOKED FOR 6 MONTHS --  QUIT  YRS AGO  ?Vaping Use  ? Vaping  Use: Never used  ?Substance and Sexual Activity  ? Alcohol use: Yes  ?  Comment: occ  ? Drug use: Never  ? Sexual activity: Not on file  ?Other Topics Concern  ? Not on file  ?Social History Narrative  ? 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 New job [employer]  ? Now on disability out of work since March.  ?  no tobacco  ? Caffeine Use: very little, three times a week  ? Left handed   ? ?Social Determinants of Health  ? ?Financial Resource Strain: Low Risk   ?  Difficulty of Paying Living Expenses: Not hard at all  ?Food Insecurity: No Food Insecurity  ? Worried About Charity fundraiser in the Last Year: Never true  ? Ran Out of Food in the Last Year: Never true  ?Transportation Needs: No Transportation Needs  ? Lack of Transportation (Medical): No  ? Lack of Transportation (Non-Medical): No  ?Physical Activity: Sufficiently Active  ? Days of Exercise per Week: 7 days  ? Minutes of Exercise per Session: 30 min  ?Stress: No Stress Concern Present  ? Feeling of Stress : Not at all  ?Social Connections: Moderately Integrated  ? Frequency of Communication with Friends and Family: Once a week  ? Frequency of Social Gatherings with Friends and Family: Twice a week  ? Attends Religious Services: 1 to 4 times per year  ? Active Member of Clubs or Organizations: No  ? Attends Archivist Meetings: Never  ? Marital Status: Married  ? ?Vitals:  ? 11/15/21 0841  ?BP: 128/80  ?Pulse: 76  ?Resp: 16  ?SpO2: 97%  ? ?Body mass index is 30.15 kg/m?. ? ?Physical Exam ?Vitals and nursing note reviewed.  ?Constitutional:   ?   General: She is not in acute distress. ?   Appearance: She is well-developed.  ?HENT:  ?   Head: Normocephalic and atraumatic.  ?   Mouth/Throat:  ?   Mouth: Mucous membranes are dry.  ?   Pharynx: Oropharynx is clear.  ?Eyes:  ?   Conjunctiva/sclera: Conjunctivae normal.  ?Cardiovascular:  ?   Rate and Rhythm: Normal rate. Rhythm irregular.  Occasional Extrasystoles are present. ?   Pulses:     ?     Dorsalis pedis pulses are 2+ on the right side and 2+ on the left side.  ?   Heart sounds: No murmur heard. ?Pulmonary:  ?   Effort: Pulmonary ef

## 2021-11-15 ENCOUNTER — Ambulatory Visit (INDEPENDENT_AMBULATORY_CARE_PROVIDER_SITE_OTHER): Payer: Medicare PPO | Admitting: Family Medicine

## 2021-11-15 ENCOUNTER — Encounter: Payer: Self-pay | Admitting: Family Medicine

## 2021-11-15 VITALS — BP 128/80 | HR 76 | Resp 16 | Ht 60.0 in | Wt 154.4 lb

## 2021-11-15 DIAGNOSIS — M25561 Pain in right knee: Secondary | ICD-10-CM

## 2021-11-15 DIAGNOSIS — M25562 Pain in left knee: Secondary | ICD-10-CM | POA: Diagnosis not present

## 2021-11-15 DIAGNOSIS — I499 Cardiac arrhythmia, unspecified: Secondary | ICD-10-CM | POA: Diagnosis not present

## 2021-11-15 DIAGNOSIS — G8929 Other chronic pain: Secondary | ICD-10-CM | POA: Diagnosis not present

## 2021-11-15 DIAGNOSIS — R5382 Chronic fatigue, unspecified: Secondary | ICD-10-CM | POA: Diagnosis not present

## 2021-11-15 NOTE — Patient Instructions (Addendum)
A few things to remember from today's visit: ? ?Irregular heart rate - Plan: EKG 12-Lead ? ?Chronic pain of both knees ? ?Chronic fatigue ? ?If you need refills please call your pharmacy. ?Do not use My Chart to request refills or for acute issues that need immediate attention. ?  ?Knee pain at night doe snot seem to be worrisome, could be arthritis. ?Monitor for new symptoms. ? ?Continue monitoring heart rate. I do not think this is causing your fatigue but a serious arrhythmia can certainly be associated with fatigue. ?Let me know if you want to go to cardiologist. ? ?Please be sure medication list is accurate. ?If a new problem present, please set up appointment sooner than planned today. ? ? ? ? ? ? ? ?

## 2021-11-29 NOTE — Progress Notes (Deleted)
? ? ?ACUTE VISIT ?No chief complaint on file. ? ?HPI: ?LoriLori [name] is a 61 y.o. female, who is here today complaining of *** ?HPI ? ?Review of Systems ?Rest see pertinent positives and negatives per HPI. ? ?Current Outpatient Medications on File Prior to Visit  ?Medication Sig Dispense Refill  ? acetaminophen (TYLENOL) 325 MG tablet Take 650 mg by mouth every 6 (six) hours as needed for mild pain, fever or headache.    ? albuterol (VENTOLIN HFA) 108 (90 Base) MCG/ACT inhaler Inhale 2 puffs into the lungs every 6 (six) hours as needed for wheezing or shortness of breath. 18 g 1  ? aspirin-acetaminophen-caffeine (EXCEDRIN MIGRAINE) 628-315-17 MG tablet Take 2 tablets by mouth every 6 (six) hours as needed for headache.    ? Blood Glucose Monitoring Suppl (ONE TOUCH ULTRA 2) w/Device KIT Use to test blood sugars 1-2 times daily. 1 kit 0  ? buPROPion (WELLBUTRIN XL) 150 MG 24 hr tablet Take 450 mg by mouth every morning.    ? calcium carbonate (TUMS) 500 MG chewable tablet Chew 2 tablets (400 mg of elemental calcium total) by mouth 3 (three) times daily. 90 tablet 1  ? divalproex (DEPAKOTE ER) 250 MG 24 hr tablet Take 750 mg by mouth at bedtime.    ? glucose blood (ONETOUCH ULTRA) test strip Use to test blood sugars 1-2 times daily. 200 each 3  ? Lancets (ONETOUCH ULTRASOFT) lancets Use to test blood sugars 1-2 times daily. 200 each 3  ? levothyroxine (SYNTHROID) 100 MCG tablet Take 1 tablet (100 mcg total) by mouth daily. 90 tablet 3  ? LORazepam (ATIVAN) 0.5 MG tablet Take 0.5-1 mg by mouth daily as needed for anxiety.    ? metFORMIN (GLUCOPHAGE) 500 MG tablet Take 1 tablet (500 mg total) by mouth 2 (two) times daily with a meal. 180 tablet 2  ? Multiple Vitamin (MULTIVITAMIN WITH MINERALS) TABS tablet Take 1 tablet by mouth daily.    ? pravastatin (PRAVACHOL) 20 MG tablet Take 1 tablet (20 mg total) by mouth daily. 90 tablet 0  ? ?No current facility-administered medications on file prior to visit.   ? ? ? ?Past Medical History:  ?Diagnosis Date  ? Allergic rhinitis 03/26/2007  ? Anginal pain ([place]) 2001  ? Autonomic dysfunction 01/11/2015  ? Bipolar 1 disorder   ? Hx of psychosis; present since age 32  ? Chronic fatigue 06/21/2017  ? Community acquired pneumonia of right lung 08/17/2020  ? Complication of anesthesia   ? agitation  ? Costochondritis 02/14/2014  ? Diverticular disease of colon 08/06/2020  ? Double vision 03/15/2011  ? Dyspnea on effort 01/10/2011  ? Dysuria 02/15/2013  ? Elevated lactic acid level   ? Endometriosis 05/11/2008  ? Fatty liver disease, nonalcoholic 61/60/7371  ? Frequency of urination   ? Gastroesophageal reflux disease 08/06/2020  ? Headache   ? History of duodenal ulcer   ? History of kidney stones   ? History of lithium toxicity 05/01/2014  ? Hyperglycemia 01/10/2011  ? Hyperlipidemia, mixed 05/12/2017  ? Hyperprolactinemia 06/26/2015  ? Insomnia 05/12/2017  ? Irritable bowel syndrome 08/06/2020  ? Jaw pain 02/13/2012  ? poss tmj area    ? Leukocytosis 05/01/2014  ? Multinodular goiter 05/12/2017  ? Nocturia   ? Nondiabetic gastroparesis 01/11/2015  ? Pelvic pain   ? Plantar fasciitis 06/11/2007  ? Pneumonia 08/20/2020  ? Pruritus ani 08/06/2020  ? Schizoaffective disorder, bipolar type   ? Unclear if this is not better  accounted for by longstanding history of bipolar disorder  ? Social anxiety disorder   ? Subacute bronchitis 07/10/2018  ? Thyroid nodule 02/02/2017  ? Urgency of urination   ? Vitamin D deficiency 09/23/2011  ? ?Allergies  ?Allergen Reactions  ? Morphine And Related Nausea And Vomiting and Nausea Only  ? Ambien [Zolpidem] Other (See Comments)  ?  Per patient this caused her to fall  ? Duloxetine Swelling and Other (See Comments)  ?  Other reaction(s): SWELLING/EDEMA  ? Anette Guarneri [Lurasidone Hcl]   ? Morphine Nausea Only  ? Shingrix [Zoster Vac Recomb Adjuvanted]   ?  Dizziness ?vomiting  ? ? ?Social History  ? ?Socioeconomic History  ? Marital status: Married  ?   Spouse name: Not on file  ? Number of children: 0  ? Years of education: 15  ? Highest education level: Bachelor's degree (e.g., BA, AB, BS)  ?Occupational History  ? 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 Smoking status: Former  ?  Packs/day: 0.01  ?  Years: 1.00  ?  Pack years: 0.01  ?  Types: Cigarettes  ?  Quit date: 18  ?  Years since quitting: 43.3  ? Smokeless tobacco: Never  ? Tobacco comments:  ?  ONLY SMOKED FOR 6 MONTHS --  QUIT  YRS AGO  ?Vaping Use  ? Vaping Use: Never used  ?Substance and Sexual Activity  ? Alcohol use: Yes  ?  Comment: occ  ? Drug use: Never  ? Sexual activity: Not on file  ?Other Topics Concern  ? Not on file  ?Social History Narrative  ? 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 New job [employer]  ? Now on disability out of work since March.  ?  no tobacco  ? Caffeine Use: very little, three times a week  ? Left handed   ? ?Social Determinants of Health  ? ?Financial Resource Strain: Low Risk   ? Difficulty of Paying Living Expenses: Not hard at all  ?Food Insecurity: No Food Insecurity  ? Worried About Charity fundraiser in the Last Year: Never true  ? Ran Out of Food in the Last Year: Never true  ?Transportation Needs: No Transportation Needs  ? Lack of Transportation (Medical): No  ? Lack of Transportation (Non-Medical): No  ?Physical Activity: Sufficiently Active  ? Days of Exercise per Week: 7 days  ? Minutes of Exercise per Session: 30 min  ?Stress: No Stress Concern Present  ? Feeling of Stress : Not at all  ?Social Connections: Moderately Integrated  ? Frequency of Communication with Friends and Family: Once a week  ? Frequency of Social Gatherings with Friends and Family: Twice a week  ? Attends Religious Services: 1 to 4 times per year  ? Active Member of Clubs or Organizations: No  ? Attends Archivist Meetings: Never  ? Marital Status: Married  ? ? ?There were no vitals filed for  this visit. ?There is no height or weight on file to calculate BMI. ? ?Physical Exam ? ?ASSESSMENT AND PLAN: ? ?There are no diagnoses linked to this encounter. ? ? ?No follow-ups on file. ? ? ?[name]. [name], MD ? ?[place]. ?[place] office. ? ?Discharge Instructions   ?None ?  ? ? ? ? ? ? ? ? ? ? ? ? ?

## 2021-12-02 ENCOUNTER — Ambulatory Visit: Payer: Medicare PPO | Admitting: Family Medicine

## 2021-12-04 ENCOUNTER — Telehealth: Payer: Self-pay | Admitting: Family Medicine

## 2021-12-04 NOTE — Telephone Encounter (Signed)
Pt requesting labwork (thyroid condition) endocrinologist can't see her until July and she is concerned ?

## 2021-12-04 NOTE — Telephone Encounter (Signed)
Okay to repeat TSH while she waits? ?

## 2021-12-06 NOTE — Telephone Encounter (Signed)
I left pt a voicemail to return my call and sent a mychart message.  ?

## 2021-12-06 NOTE — Telephone Encounter (Signed)
Last TSH in 09/2021 was normal, so follow up in 02/2022 is appropriate. ?If she still wants it done , order can be placed. ?No biotin, if taking any, 2 weeks before lab and ideally to be done in the morning. ?Thanks, ?BJ ?

## 2021-12-10 ENCOUNTER — Other Ambulatory Visit: Payer: Self-pay

## 2021-12-10 DIAGNOSIS — E890 Postprocedural hypothyroidism: Secondary | ICD-10-CM

## 2021-12-11 ENCOUNTER — Ambulatory Visit: Payer: Medicare PPO

## 2021-12-11 ENCOUNTER — Ambulatory Visit: Payer: Medicare Other

## 2021-12-11 ENCOUNTER — Telehealth: Payer: Self-pay

## 2021-12-11 NOTE — Telephone Encounter (Signed)
Unsuccessful attempt to each patient on preferred number listed in notes for scheduled AWV. Left message on voicemail okay to reschedule. ?

## 2021-12-11 NOTE — Progress Notes (Signed)
MedicareThis encounter was created in error - please disregard. ?

## 2021-12-16 ENCOUNTER — Telehealth: Payer: Self-pay | Admitting: Family Medicine

## 2021-12-16 NOTE — Telephone Encounter (Signed)
Left message for patient to call back and schedule Medicare Annual Wellness Visit (AWV) either virtually or in office. Left  my jabber number [phone]   Last AWV ;12/05/20  please schedule at anytime with [facility] Nurse Health Advisor 1 or 2   

## 2021-12-25 ENCOUNTER — Ambulatory Visit (INDEPENDENT_AMBULATORY_CARE_PROVIDER_SITE_OTHER): Payer: Medicare PPO | Admitting: Family Medicine

## 2021-12-25 ENCOUNTER — Encounter: Payer: Self-pay | Admitting: Family Medicine

## 2021-12-25 VITALS — BP 126/80 | HR 92 | Temp 98.4°F | Resp 16 | Ht 60.0 in | Wt 157.1 lb

## 2021-12-25 DIAGNOSIS — E890 Postprocedural hypothyroidism: Secondary | ICD-10-CM

## 2021-12-25 DIAGNOSIS — R7401 Elevation of levels of liver transaminase levels: Secondary | ICD-10-CM

## 2021-12-25 DIAGNOSIS — R2689 Other abnormalities of gait and mobility: Secondary | ICD-10-CM

## 2021-12-25 DIAGNOSIS — H9202 Otalgia, left ear: Secondary | ICD-10-CM

## 2021-12-25 DIAGNOSIS — Z9181 History of falling: Secondary | ICD-10-CM | POA: Diagnosis not present

## 2021-12-25 NOTE — Progress Notes (Signed)
? ? ?ACUTE VISIT ?Chief Complaint  ?Patient presents with  ? Otalgia  ?  Left ear  ? Fall  ?  Feeling off balance, getting worse.   ? ?HPI: ?LoriLori [name] is a 61 y.o. female with hx of DM II, postsurgical hypothyroidism,bipolar type schizophrenic disorder, GERD,vit D def,and chronic fatigue here today with her spouse with above complaints. ?"Deep" intermittent left earache, which she has had "for a while", started before thyroidectomy, 05/24/2021. ?Sharp, sudden onset and last a few seconds at the time. ?No associated facial pain. ?She has not identified exacerbating or alleviating factors. ? ?Otalgia  ?There is pain in the left ear. This is a recurrent problem. The problem occurs constantly. The problem has been unchanged. There has been no fever. The pain is moderate. Associated symptoms include ear discharge. Pertinent negatives include no coughing, diarrhea, headaches, hearing loss, rash, rhinorrhea or sore throat. She has tried nothing for the symptoms. There is no history of a chronic ear infection or hearing loss.  ?Negative for travel or URI around the time symptoms started. ?It has been stable. ?No changes in hearing. ?She does not use Q tips and no hx of trauma. ? ?Her wife is very concerned about her balance, which seems to be getting worse. ?She is reporting this as a new problem that started 3 weeks ago. ?No falls but almost. ?This happens "all the time" she is up. ?Negative for associated numbness,tingling, skin rash,or cyanosis. ?+ LE weakness. ?"Little dizzy" when she moves fast. ? ?She has been referred to neuro because LE weakness and balance issues, states that these are not the same symptoms she is having now. ?She has done PT in the past. ? ?No new medications. ?Also concerned about memory problems, for a while but getting worse. ?Her mother passed away later last months and had Alzheimer's disease. ? ?She would like to have TSH rechecked. ?Lab Results  ?Component Value Date  ? TSH 1.75  10/02/2021  ?S/P total thyroidectomy. ?She is on Levothyroxine 100 mcg daily, has appt with endocrinologist in a few months. ? ?Abnormal LFT's. ?Lab Results  ?Component Value Date  ? ALT 58 (H) 09/03/2021  ? AST 33 09/03/2021  ? ALKPHOS 86 09/03/2021  ? BILITOT 0.7 09/03/2021  ? ?No unusual nausea, hx of gastroparesis. ?No vomiting, abdominal pain,vomiting,or jaundice. ? ?Review of Systems  ?Constitutional:  Positive for fatigue. Negative for appetite change and chills.  ?HENT:  Positive for ear discharge and ear pain. Negative for hearing loss, rhinorrhea and sore throat.   ?Eyes:  Negative for redness and visual disturbance.  ?Respiratory:  Negative for cough, shortness of breath and wheezing.   ?Cardiovascular:  Negative for chest pain, palpitations and leg swelling.  ?Gastrointestinal:  Negative for diarrhea.  ?     No changes in bowel habits.  ?Endocrine: Negative for cold intolerance and heat intolerance.  ?Genitourinary:  Negative for decreased urine volume, dysuria and hematuria.  ?Skin:  Negative for rash.  ?Allergic/Immunologic: Positive for environmental allergies.  ?Neurological:  Negative for syncope and headaches.  ?Hematological:  Negative for adenopathy. Does not bruise/bleed easily.  ?Psychiatric/Behavioral:  Negative for hallucinations. The patient is nervous/anxious.   ?Rest see pertinent positives and negatives per HPI. ? ?Current Outpatient Medications on File Prior to Visit  ?Medication Sig Dispense Refill  ? acetaminophen (TYLENOL) 325 MG tablet Take 650 mg by mouth every 6 (six) hours as needed for mild pain, fever or headache.    ? aspirin-acetaminophen-caffeine (EXCEDRIN MIGRAINE) 250-250-65 MG  tablet Take 2 tablets by mouth every 6 (six) hours as needed for headache.    ? Blood Glucose Monitoring Suppl (ONE TOUCH ULTRA 2) w/Device KIT Use to test blood sugars 1-2 times daily. 1 kit 0  ? buPROPion (WELLBUTRIN XL) 150 MG 24 hr tablet Take 450 mg by mouth every morning.    ? calcium carbonate  (TUMS) 500 MG chewable tablet Chew 2 tablets (400 mg of elemental calcium total) by mouth 3 (three) times daily. 90 tablet 1  ? divalproex (DEPAKOTE ER) 250 MG 24 hr tablet Take 750 mg by mouth at bedtime.    ? glucose blood (ONETOUCH ULTRA) test strip Use to test blood sugars 1-2 times daily. 200 each 3  ? Lancets (ONETOUCH ULTRASOFT) lancets Use to test blood sugars 1-2 times daily. 200 each 3  ? levothyroxine (SYNTHROID) 100 MCG tablet Take 1 tablet (100 mcg total) by mouth daily. 90 tablet 3  ? LORazepam (ATIVAN) 0.5 MG tablet Take 0.5-1 mg by mouth daily as needed for anxiety.    ? metFORMIN (GLUCOPHAGE) 500 MG tablet Take 1 tablet (500 mg total) by mouth 2 (two) times daily with a meal. 180 tablet 2  ? Multiple Vitamin (MULTIVITAMIN WITH MINERALS) TABS tablet Take 1 tablet by mouth daily.    ? pravastatin (PRAVACHOL) 20 MG tablet Take 1 tablet (20 mg total) by mouth daily. 90 tablet 0  ? albuterol (VENTOLIN HFA) 108 (90 Base) MCG/ACT inhaler Inhale 2 puffs into the lungs every 6 (six) hours as needed for wheezing or shortness of breath. 18 g 1  ? ?No current facility-administered medications on file prior to visit.  ? ?Past Medical History:  ?Diagnosis Date  ? Allergic rhinitis 03/26/2007  ? Anginal pain ([place]) 2001  ? Autonomic dysfunction 01/11/2015  ? Bipolar 1 disorder   ? Hx of psychosis; present since age 8  ? Chronic fatigue 06/21/2017  ? Community acquired pneumonia of right lung 08/17/2020  ? Complication of anesthesia   ? agitation  ? Costochondritis 02/14/2014  ? Diverticular disease of colon 08/06/2020  ? Double vision 03/15/2011  ? Dyspnea on effort 01/10/2011  ? Dysuria 02/15/2013  ? Elevated lactic acid level   ? Endometriosis 05/11/2008  ? Fatty liver disease, nonalcoholic 16/83/7290  ? Frequency of urination   ? Gastroesophageal reflux disease 08/06/2020  ? Headache   ? History of duodenal ulcer   ? History of kidney stones   ? History of lithium toxicity 05/01/2014  ? Hyperglycemia 01/10/2011   ? Hyperlipidemia, mixed 05/12/2017  ? Hyperprolactinemia 06/26/2015  ? Insomnia 05/12/2017  ? Irritable bowel syndrome 08/06/2020  ? Jaw pain 02/13/2012  ? poss tmj area    ? Leukocytosis 05/01/2014  ? Multinodular goiter 05/12/2017  ? Nocturia   ? Nondiabetic gastroparesis 01/11/2015  ? Pelvic pain   ? Plantar fasciitis 06/11/2007  ? Pneumonia 08/20/2020  ? Pruritus ani 08/06/2020  ? Schizoaffective disorder, bipolar type   ? Unclear if this is not better accounted for by longstanding history of bipolar disorder  ? Social anxiety disorder   ? Subacute bronchitis 07/10/2018  ? Thyroid nodule 02/02/2017  ? Urgency of urination   ? Vitamin D deficiency 09/23/2011  ? ?Allergies  ?Allergen Reactions  ? Morphine And Related Nausea And Vomiting and Nausea Only  ? Ambien [Zolpidem] Other (See Comments)  ?  Per patient this caused her to fall  ? Duloxetine Swelling and Other (See Comments)  ?  Other reaction(s): SWELLING/EDEMA  ? [place] [  Lurasidone Hcl]   ? Morphine Nausea Only  ? Shingrix [Zoster Vac Recomb Adjuvanted]   ?  Dizziness ?vomiting  ? ? ?Social History  ? ?Socioeconomic History  ? Marital status: Married  ?  Spouse name: Not on file  ? Number of children: 0  ? Years of education: 63  ? Highest education level: Bachelor's degree (e.g., BA, AB, BS)  ?Occupational History  ? 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 Smoking status: Former  ?  Packs/day: 0.01  ?  Years: 1.00  ?  Pack years: 0.01  ?  Types: Cigarettes  ?  Quit date: 6  ?  Years since quitting: 43.3  ? Smokeless tobacco: Never  ? Tobacco comments:  ?  ONLY SMOKED FOR 6 MONTHS --  QUIT  YRS AGO  ?Vaping Use  ? Vaping Use: Never used  ?Substance and Sexual Activity  ? Alcohol use: Yes  ?  Comment: occ  ? Drug use: Never  ? Sexual activity: Not on file  ?Other Topics Concern  ? Not on file  ?Social History Narrative  ? 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 New job [employer]  ? Now on disability out of work since March.  ?  no tobacco  ? Caffeine Use: very little, three times a week  ? Left handed   ? ?Social Determinants of Health  ? ?Financial Resource Strain: Low

## 2021-12-25 NOTE — Patient Instructions (Addendum)
A few things to remember from today's visit: ? ?Balance disorder - Plan: Ambulatory referral to Physical Therapy, Basic metabolic panel, CBC, CBC, Basic metabolic panel ? ?Risk for falls - Plan: Ambulatory referral to Physical Therapy ? ?Earache on left ? ?Postsurgical hypothyroidism - Plan: TSH ? ?Elevated ALT measurement - Plan: ALT ? ?If you need refills please call your pharmacy. ?Do not use My Chart to request refills or for acute issues that need immediate attention. ?  ?Ear exam negative. ?We will arrange PT,occupational therapy,and neurologist. ?Fall precautions. ? ?Please be sure medication list is accurate. ?If a new problem present, please set up appointment sooner than planned today. ? ? ? ? ? ? ? ?

## 2021-12-26 ENCOUNTER — Encounter: Payer: Self-pay | Admitting: Family Medicine

## 2021-12-26 LAB — CBC
HCT: 42 % (ref 36.0–46.0)
Hemoglobin: 14.1 g/dL (ref 12.0–15.0)
MCHC: 33.5 g/dL (ref 30.0–36.0)
MCV: 93.9 fl (ref 78.0–100.0)
Platelets: 231 10*3/uL (ref 150.0–400.0)
RBC: 4.47 Mil/uL (ref 3.87–5.11)
RDW: 13.1 % (ref 11.5–15.5)
WBC: 5.9 10*3/uL (ref 4.0–10.5)

## 2021-12-26 LAB — BASIC METABOLIC PANEL
BUN: 16 mg/dL (ref 6–23)
CO2: 23 mEq/L (ref 19–32)
Calcium: 9.6 mg/dL (ref 8.4–10.5)
Chloride: 105 mEq/L (ref 96–112)
Creatinine, Ser: 0.89 mg/dL (ref 0.40–1.20)
GFR: 70.68 mL/min (ref >60.00)
Glucose, Bld: 82 mg/dL (ref 70–99)
Potassium: 4.3 mEq/L (ref 3.5–5.1)
Sodium: 135 mEq/L (ref 135–145)

## 2021-12-26 LAB — ALT: ALT: 30 U/L (ref 0–35)

## 2021-12-26 LAB — TSH: TSH: 6.73 u[IU]/mL — ABNORMAL HIGH (ref 0.35–5.50)

## 2021-12-27 ENCOUNTER — Ambulatory Visit (INDEPENDENT_AMBULATORY_CARE_PROVIDER_SITE_OTHER): Payer: Medicare PPO

## 2021-12-27 VITALS — Ht 60.0 in | Wt 157.0 lb

## 2021-12-27 DIAGNOSIS — Z0000 Encounter for general adult medical examination without abnormal findings: Secondary | ICD-10-CM | POA: Diagnosis not present

## 2021-12-27 NOTE — Progress Notes (Signed)
? ?Subjective:  ? [name] is a 61 y.o. female who presents for Medicare Annual (Subsequent) preventive examination. ? ?Review of Systems    ?Virtual Visit via Telephone Note ? ?I connected with  [name] on 12/27/21 at  3:00 PM EDT by telephone and verified that I am speaking with the correct person using two identifiers. ? ?Location: ?Patient: Home ?Provider: Office ?Persons participating in the virtual visit: patient/Nurse Health Advisor ?  ?I discussed the limitations, risks, security and privacy concerns of performing an evaluation and management service by telephone and the availability of in person appointments. The patient expressed understanding and agreed to proceed. ? ?Interactive audio and video telecommunications were attempted between this nurse and patient, however failed, due to patient having technical difficulties OR patient did not have access to video capability.  We continued and completed visit with audio only. ? ?Some vital signs may be absent or patient reported.  ? ?Criselda Peaches, LPN  ?Cardiac Risk Factors include: advanced age (>66mn, >>55women);diabetes mellitus ? ?   ?Objective:  ?  ?Today's Vitals  ? 12/27/21 1509  ?Weight: 157 lb (71.2 kg)  ?Height: 5' (1.524 m)  ? ?Body mass index is 30.66 kg/m?. ? ? ?  12/27/2021  ?  3:29 PM 05/24/2021  ? 12:00 PM 05/08/2021  ?  8:18 AM 12/05/2020  ?  2:00 PM 10/15/2020  ?  3:57 PM 08/17/2020  ?  9:24 AM 06/15/2020  ?  2:35 PM  ?Advanced Directives  ?Does Patient Have a Medical Advance Directive? No No No Yes No No No  ?Type of Advance Directive    Living will     ?Does patient want to make changes to medical advance directive? No - Patient declined        ?Would patient like information on creating a medical advance directive? No - Patient declined Yes (MAU/Ambulatory/Procedural Areas - Information given) Yes (MAU/Ambulatory/Procedural Areas - Information given)   No - Patient declined No - Patient declined  ? ? ?Current Medications  (verified) ?Outpatient Encounter Medications as of 12/27/2021  ?Medication Sig  ? acetaminophen (TYLENOL) 325 MG tablet Take 650 mg by mouth every 6 (six) hours as needed for mild pain, fever or headache.  ? albuterol (VENTOLIN HFA) 108 (90 Base) MCG/ACT inhaler Inhale 2 puffs into the lungs every 6 (six) hours as needed for wheezing or shortness of breath.  ? aspirin-acetaminophen-caffeine (EXCEDRIN MIGRAINE) 250-250-65 MG tablet Take 2 tablets by mouth every 6 (six) hours as needed for headache.  ? Blood Glucose Monitoring Suppl (ONE TOUCH ULTRA 2) w/Device KIT Use to test blood sugars 1-2 times daily.  ? buPROPion (WELLBUTRIN XL) 150 MG 24 hr tablet Take 450 mg by mouth every morning.  ? calcium carbonate (TUMS) 500 MG chewable tablet Chew 2 tablets (400 mg of elemental calcium total) by mouth 3 (three) times daily.  ? divalproex (DEPAKOTE ER) 250 MG 24 hr tablet Take 750 mg by mouth at bedtime.  ? glucose blood (ONETOUCH ULTRA) test strip Use to test blood sugars 1-2 times daily.  ? Lancets (ONETOUCH ULTRASOFT) lancets Use to test blood sugars 1-2 times daily.  ? levothyroxine (SYNTHROID) 100 MCG tablet Take 1 tablet (100 mcg total) by mouth daily.  ? LORazepam (ATIVAN) 0.5 MG tablet Take 0.5-1 mg by mouth daily as needed for anxiety.  ? metFORMIN (GLUCOPHAGE) 500 MG tablet Take 1 tablet (500 mg total) by mouth 2 (two) times daily with a meal.  ? Multiple  Vitamin (MULTIVITAMIN WITH MINERALS) TABS tablet Take 1 tablet by mouth daily.  ? pravastatin (PRAVACHOL) 20 MG tablet Take 1 tablet (20 mg total) by mouth daily.  ? ?No facility-administered encounter medications on file as of 12/27/2021.  ? ? ?Allergies (verified) ?Morphine and related, Ambien [zolpidem], Duloxetine, Latuda [lurasidone hcl], Morphine, and Shingrix [zoster vac recomb adjuvanted]  ? ?History: ?Past Medical History:  ?Diagnosis Date  ? Allergic rhinitis 03/26/2007  ? Anginal pain ([place]) 2001  ? Autonomic dysfunction 01/11/2015  ? Bipolar 1 disorder    ? Hx of psychosis; present since age 62  ? Chronic fatigue 06/21/2017  ? Community acquired pneumonia of right lung 08/17/2020  ? Complication of anesthesia   ? agitation  ? Costochondritis 02/14/2014  ? Diverticular disease of colon 08/06/2020  ? Double vision 03/15/2011  ? Dyspnea on effort 01/10/2011  ? Dysuria 02/15/2013  ? Elevated lactic acid level   ? Endometriosis 05/11/2008  ? Fatty liver disease, nonalcoholic 38/05/1750  ? Frequency of urination   ? Gastroesophageal reflux disease 08/06/2020  ? Headache   ? History of duodenal ulcer   ? History of kidney stones   ? History of lithium toxicity 05/01/2014  ? Hyperglycemia 01/10/2011  ? Hyperlipidemia, mixed 05/12/2017  ? Hyperprolactinemia 06/26/2015  ? Insomnia 05/12/2017  ? Irritable bowel syndrome 08/06/2020  ? Jaw pain 02/13/2012  ? poss tmj area    ? Leukocytosis 05/01/2014  ? Multinodular goiter 05/12/2017  ? Nocturia   ? Nondiabetic gastroparesis 01/11/2015  ? Pelvic pain   ? Plantar fasciitis 06/11/2007  ? Pneumonia 08/20/2020  ? Pruritus ani 08/06/2020  ? Schizoaffective disorder, bipolar type   ? Unclear if this is not better accounted for by longstanding history of bipolar disorder  ? Social anxiety disorder   ? Subacute bronchitis 07/10/2018  ? Thyroid nodule 02/02/2017  ? Urgency of urination   ? Vitamin D deficiency 09/23/2011  ? ?Past Surgical History:  ?Procedure Laterality Date  ? CARDIAC CATHETERIZATION  10-09-1999  DR Ron Parker  ? NORMAL LVF/  NORMAL RCA/  NO CRITICAL DISEASE LEFT CORONARY SYSTEM  ? CARDIOVASCULAR STRESS TEST  01-23-2011  ? NORMAL NUCLEAR STUDY/ NO ISCHEMIA/ EF 81%  ? CYSTOSCOPY WITH BIOPSY N/A 06/03/2013  ? Procedure: CYSTOSCOPY WITH BIOPSY  INSTILLATION OF MARCAINE AND PYRIDIUM;  Surgeon: Hanley Ben, MD;  Location: [facility];  Service: Urology;  Laterality: N/A;  ? LAPAROSCOPIC CHOLECYSTECTOMY  11-08-2001  ? LAPAROSCOPIC LEFT SALPINGOOPHORECTOMY AND LYSYS ADHESIONS  03-10-2002  ? LAPAROSCOPIC REMOVAL  OVARY REMNANT  2003   [place]  ? LAPAROSCOPY N/A 12/14/2012  ? Procedure: LAPAROSCOPY DIAGNOSTIC;  Surgeon: Stark Klein, MD;  Location: [facility];  Service: General;  Laterality: N/A;  ? THYROIDECTOMY N/A 05/24/2021  ? Procedure: TOTAL THYROIDECTOMY;  Surgeon: Armandina Gemma, MD;  Location: [facility];  Service: General;  Laterality: N/A;  ? TOTAL ABDOMINAL HYSTERECTOMY  2000  ? W/ RIGHT SALPINGOOPHORECTOMY  ? TRANSTHORACIC ECHOCARDIOGRAM  10-13-2010  ? NORMAL LVF/  EF 55-60%  ? ?Family History  ?Problem Relation Age of Onset  ? Sleep apnea Father   ? Depression Father   ? Alcohol abuse Father   ? Hypertension Father   ? Migraines Sister   ?     headaches  ? Anxiety disorder Sister   ? Other Mother   ?     MAC infection  ? Anxiety disorder Mother   ? Dementia Mother   ?     likely Alzheimer's disease; symptom  onset on late 70s/early 80s  ? Heart disease Paternal Grandmother   ? Hyperlipidemia Paternal Grandmother   ? Alcohol abuse Paternal Grandfather   ? Diabetes Neg Hx   ? ?Social History  ? ?Socioeconomic History  ? Marital status: Married  ?  Spouse name: Not on file  ? Number of children: 0  ? Years of education: 55  ? Highest education level: Bachelor's degree (e.g., BA, AB, BS)  ?Occupational History  ? 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 Smoking status: Former  ?  Packs/day: 0.01  ?  Years: 1.00  ?  Pack years: 0.01  ?  Types: Cigarettes  ?  Quit date: 73  ?  Years since quitting: 43.3  ? Smokeless tobacco: Never  ? Tobacco comments:  ?  ONLY SMOKED FOR 6 MONTHS --  QUIT  YRS AGO  ?Vaping Use  ? Vaping Use: Never used  ?Substance and Sexual Activity  ? Alcohol use: Yes  ?  Comment: occ  ? Drug use: Never  ? Sexual activity: Not on file  ?Other Topics Concern  ? Not on file  ?Social History Narrative  ? 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 New job [employer]  ? Now on disability out of work since March.  ?  no tobacco  ?  Caffeine Use: very little, three times a week  ? Left handed   ? ?Social Determinants of Health  ? ?Financial Resource Strain: Low Risk   ? Difficulty of Paying Living Expenses: Not hard at all  ?Food Insec

## 2021-12-27 NOTE — Patient Instructions (Addendum)
?[name] , ?Thank you for taking time to come for your Medicare Wellness Visit. I appreciate your ongoing commitment to your health goals. Please review the following plan we discussed and let me know if I can assist you in the future.  ? ?These are the goals we discussed: ? Goals   ? ?   DIET - REDUCE CALORIE INTAKE   ?   1800 cal/day. ?  ?   Patient Stated (pt-stated)   ?   Lose weight and get in shape.  ?  ? ?  ?  ?This is a list of the screening recommended for you and due dates:  ?Health Maintenance  ?Topic Date Due  ? Complete foot exam   Never done  ? Eye exam for diabetics  Never done  ? Urine Protein Check  01/27/2022*  ? Tetanus Vaccine  08/22/2022*  ? Zoster (Shingles) Vaccine (1 of 2) 08/22/2022*  ? Mammogram  12/28/2022*  ? Flu Shot  03/18/2022  ? Hemoglobin A1C  04/01/2022  ? Colon Cancer Screening  10/11/2022  ? COVID-19 Vaccine  Completed  ? Hepatitis C Screening: USPSTF Recommendation to screen - Ages 48-79 yo.  Completed  ? HIV Screening  Completed  ? HPV Vaccine  Aged Out  ? Pap Smear  Discontinued  ?*Topic was postponed. The date shown is not the original due date.  ? ?Advanced directives: No ? ?Conditions/risks identified: None ? ?Next appointment: Follow up in one year for your annual wellness visit.  ? ?Preventive Care 40-64 Years, Female ?Preventive care refers to lifestyle choices and visits with your health care provider that can promote health and wellness. ?What does preventive care include? ?A yearly physical exam. This is also called an annual well check. ?Dental exams once or twice a year. ?Routine eye exams. Ask your health care provider how often you should have your eyes checked. ?Personal lifestyle choices, including: ?Daily care of your teeth and gums. ?Regular physical activity. ?Eating a healthy diet. ?Avoiding tobacco and drug use. ?Limiting alcohol use. ?Practicing safe sex. ?Taking low-dose aspirin daily starting at age 31. ?Taking vitamin and mineral supplements as  recommended by your health care provider. ?What happens during an annual well check? ?The services and screenings done by your health care provider during your annual well check will depend on your age, overall health, lifestyle risk factors, and family history of disease. ?Counseling  ?Your health care provider may ask you questions about your: ?Alcohol use. ?Tobacco use. ?Drug use. ?Emotional well-being. ?Home and relationship well-being. ?Sexual activity. ?Eating habits. ?Work and work Statistician. ?Method of birth control. ?Menstrual cycle. ?Pregnancy history. ?Screening  ?You may have the following tests or measurements: ?Height, weight, and BMI. ?Blood pressure. ?Lipid and cholesterol levels. These may be checked every 5 years, or more frequently if you are over 66 years old. ?Skin check. ?Lung cancer screening. You may have this screening every year starting at age 71 if you have a 30-pack-year history of smoking and currently smoke or have quit within the past 15 years. ?Fecal occult blood test (FOBT) of the stool. You may have this test every year starting at age 33. ?Flexible sigmoidoscopy or colonoscopy. You may have a sigmoidoscopy every 5 years or a colonoscopy every 10 years starting at age 23. ?Hepatitis C blood test. ?Hepatitis B blood test. ?Sexually transmitted disease (STD) testing. ?Diabetes screening. This is done by checking your blood sugar (glucose) after you have not eaten for a while (fasting). You may have this  done every 1-3 years. ?Mammogram. This may be done every 1-2 years. Talk to your health care provider about when you should start having regular mammograms. This may depend on whether you have a family history of breast cancer. ?BRCA-related cancer screening. This may be done if you have a family history of breast, ovarian, tubal, or peritoneal cancers. ?Pelvic exam and Pap test. This may be done every 3 years starting at age 25. Starting at age 38, this may be done every 5 years if  you have a Pap test in combination with an HPV test. ?Bone density scan. This is done to screen for osteoporosis. You may have this scan if you are at high risk for osteoporosis. ?Discuss your test results, treatment options, and if necessary, the need for more tests with your health care provider. ?Vaccines  ?Your health care provider may recommend certain vaccines, such as: ?Influenza vaccine. This is recommended every year. ?Tetanus, diphtheria, and acellular pertussis (Tdap, Td) vaccine. You may need a Td booster every 10 years. ?Zoster vaccine. You may need this after age 51. ?Pneumococcal 13-valent conjugate (PCV13) vaccine. You may need this if you have certain conditions and were not previously vaccinated. ?Pneumococcal polysaccharide (PPSV23) vaccine. You may need one or two doses if you smoke cigarettes or if you have certain conditions. ?Talk to your health care provider about which screenings and vaccines you need and how often you need them. ?This information is not intended to replace advice given to you by your health care provider. Make sure you discuss any questions you have with your health care provider. ?Document Released: 08/31/2015 Document Revised: 04/23/2016 Document Reviewed: 06/05/2015 ?Elsevier Interactive Patient Education ? 2017 Elsevier Inc. ? ? ? ?Fall Prevention in the Home ?Falls can cause injuries. They can happen to people of all ages. There are many things you can do to make your home safe and to help prevent falls. ?What can I do on the outside of my home? ?Regularly fix the edges of walkways and driveways and fix any cracks. ?Remove anything that might make you trip as you walk through a door, such as a raised step or threshold. ?Trim any bushes or trees on the path to your home. ?Use bright outdoor lighting. ?Clear any walking paths of anything that might make someone trip, such as rocks or tools. ?Regularly check to see if handrails are loose or broken. Make sure that both  sides of any steps have handrails. ?Any raised decks and porches should have guardrails on the edges. ?Have any leaves, snow, or ice cleared regularly. ?Use sand or salt on walking paths during winter. ?Clean up any spills in your garage right away. This includes oil or grease spills. ?What can I do in the bathroom? ?Use night lights. ?Install grab bars by the toilet and in the tub and shower. Do not use towel bars as grab bars. ?Use non-skid mats or decals in the tub or shower. ?If you need to sit down in the shower, use a plastic, non-slip stool. ?Keep the floor dry. Clean up any water that spills on the floor as soon as it happens. ?Remove soap buildup in the tub or shower regularly. ?Attach bath mats securely with double-sided non-slip rug tape. ?Do not have throw rugs and other things on the floor that can make you trip. ?What can I do in the bedroom? ?Use night lights. ?Make sure that you have a light by your bed that is easy to reach. ?Do not use any  sheets or blankets that are too big for your bed. They should not hang down onto the floor. ?Have a firm chair that has side arms. You can use this for support while you get dressed. ?Do not have throw rugs and other things on the floor that can make you trip. ?What can I do in the kitchen? ?Clean up any spills right away. ?Avoid walking on wet floors. ?Keep items that you use a lot in easy-to-reach places. ?If you need to reach something above you, use a strong step stool that has a grab bar. ?Keep electrical cords out of the way. ?Do not use floor polish or wax that makes floors slippery. If you must use wax, use non-skid floor wax. ?Do not have throw rugs and other things on the floor that can make you trip. ?What can I do with my stairs? ?Do not leave any items on the stairs. ?Make sure that there are handrails on both sides of the stairs and use them. Fix handrails that are broken or loose. Make sure that handrails are as long as the stairways. ?Check any  carpeting to make sure that it is firmly attached to the stairs. Fix any carpet that is loose or worn. ?Avoid having throw rugs at the top or bottom of the stairs. If you do have throw rugs, attach them to

## 2021-12-31 ENCOUNTER — Other Ambulatory Visit: Payer: Self-pay

## 2021-12-31 DIAGNOSIS — E890 Postprocedural hypothyroidism: Secondary | ICD-10-CM

## 2022-01-06 DIAGNOSIS — Z1231 Encounter for screening mammogram for malignant neoplasm of breast: Secondary | ICD-10-CM | POA: Diagnosis not present

## 2022-01-07 ENCOUNTER — Ambulatory Visit: Payer: Medicare PPO | Admitting: Internal Medicine

## 2022-01-29 DIAGNOSIS — F3131 Bipolar disorder, current episode depressed, mild: Secondary | ICD-10-CM | POA: Diagnosis not present

## 2022-01-30 ENCOUNTER — Other Ambulatory Visit: Payer: Self-pay | Admitting: Family Medicine

## 2022-01-30 DIAGNOSIS — E782 Mixed hyperlipidemia: Secondary | ICD-10-CM

## 2022-02-06 DIAGNOSIS — F29 Unspecified psychosis not due to a substance or known physiological condition: Secondary | ICD-10-CM | POA: Diagnosis not present

## 2022-02-11 DIAGNOSIS — B351 Tinea unguium: Secondary | ICD-10-CM | POA: Diagnosis not present

## 2022-02-11 DIAGNOSIS — M79676 Pain in unspecified toe(s): Secondary | ICD-10-CM | POA: Diagnosis not present

## 2022-02-19 NOTE — Progress Notes (Deleted)
ACUTE VISIT No chief complaint on file.  HPI: [name] is a 61 y.o. female, who is here today complaining of *** HPI  Review of Systems Rest see pertinent positives and negatives per HPI.  Current Outpatient Medications on File Prior to Visit  Medication Sig Dispense Refill  . acetaminophen (TYLENOL) 325 MG tablet Take 650 mg by mouth every 6 (six) hours as needed for mild pain, fever or headache.    . albuterol (VENTOLIN HFA) 108 (90 Base) MCG/ACT inhaler Inhale 2 puffs into the lungs every 6 (six) hours as needed for wheezing or shortness of breath. 18 g 1  . aspirin-acetaminophen-caffeine (EXCEDRIN MIGRAINE) 673-419-37 MG tablet Take 2 tablets by mouth every 6 (six) hours as needed for headache.    . Blood Glucose Monitoring Suppl (ONE TOUCH ULTRA 2) w/Device KIT Use to test blood sugars 1-2 times daily. 1 kit 0  . buPROPion (WELLBUTRIN XL) 150 MG 24 hr tablet Take 450 mg by mouth every morning.    . calcium carbonate (TUMS) 500 MG chewable tablet Chew 2 tablets (400 mg of elemental calcium total) by mouth 3 (three) times daily. 90 tablet 1  . divalproex (DEPAKOTE ER) 250 MG 24 hr tablet Take 750 mg by mouth at bedtime.    [name] Kitchen glucose blood (ONETOUCH ULTRA) test strip Use to test blood sugars 1-2 times daily. 200 each 3  . Lancets (ONETOUCH ULTRASOFT) lancets Use to test blood sugars 1-2 times daily. 200 each 3  . levothyroxine (SYNTHROID) 100 MCG tablet Take 1 tablet (100 mcg total) by mouth daily. 90 tablet 3  . LORazepam (ATIVAN) 0.5 MG tablet Take 0.5-1 mg by mouth daily as needed for anxiety.    . metFORMIN (GLUCOPHAGE) 500 MG tablet Take 1 tablet (500 mg total) by mouth 2 (two) times daily with a meal. 180 tablet 2  . Multiple Vitamin (MULTIVITAMIN WITH MINERALS) TABS tablet Take 1 tablet by mouth daily.    . pravastatin (PRAVACHOL) 20 MG tablet Take 1 tablet by mouth once daily 90 tablet 2   No current facility-administered medications on file prior to visit.      Past Medical History:  Diagnosis Date  . Allergic rhinitis 03/26/2007  . Anginal pain ([place]) 2001  . Autonomic dysfunction 01/11/2015  . Bipolar 1 disorder    Hx of psychosis; present since age 23  . Chronic fatigue 06/21/2017  . Community acquired pneumonia of right lung 08/17/2020  . Complication of anesthesia    agitation  . Costochondritis 02/14/2014  . Diverticular disease of colon 08/06/2020  . Double vision 03/15/2011  . Dyspnea on effort 01/10/2011  . Dysuria 02/15/2013  . Elevated lactic acid level   . Endometriosis 05/11/2008  . Fatty liver disease, nonalcoholic 90/24/0973  . Frequency of urination   . Gastroesophageal reflux disease 08/06/2020  . Headache   . History of duodenal ulcer   . History of kidney stones   . History of lithium toxicity 05/01/2014  . Hyperglycemia 01/10/2011  . Hyperlipidemia, mixed 05/12/2017  . Hyperprolactinemia 06/26/2015  . Insomnia 05/12/2017  . Irritable bowel syndrome 08/06/2020  . Jaw pain 02/13/2012   poss tmj area    . Leukocytosis 05/01/2014  . Multinodular goiter 05/12/2017  . Nocturia   . Nondiabetic gastroparesis 01/11/2015  . Pelvic pain   . Plantar fasciitis 06/11/2007  . Pneumonia 08/20/2020  . Pruritus ani 08/06/2020  . Schizoaffective disorder, bipolar type    Unclear if this is not better accounted for  by longstanding history of bipolar disorder  . Social anxiety disorder   . Subacute bronchitis 07/10/2018  . Thyroid nodule 02/02/2017  . Urgency of urination   . Vitamin D deficiency 09/23/2011   Allergies  Allergen Reactions  . Morphine And Related Nausea And Vomiting and Nausea Only  . Ambien [Zolpidem] Other (See Comments)    Per patient this caused her to fall  . Duloxetine Swelling and Other (See Comments)    Other reaction(s): SWELLING/EDEMA  . Latuda [Lurasidone Hcl]   . Morphine Nausea Only  . Shingrix [Zoster Vac Recomb Adjuvanted]     Dizziness vomiting    Social History    Socioeconomic History  . Marital status: Married    Spouse name: Not on file  . Number of children: 0  . Years of education: 16  . Highest education level: Bachelor's degree (e.g., BA, AB, BS)  Occupational History  . Occupation: Disability    Comment: Nurse  Tobacco Use  . Smoking status: Former    Packs/day: 0.01    Years: 1.00    Total pack years: 0.01    Types: Cigarettes    Quit date: 1980    Years since quitting: 43.5  . Smokeless tobacco: Never  . Tobacco comments:    ONLY SMOKED FOR 6 MONTHS --  QUIT  YRS AGO  Vaping Use  . Vaping Use: Never used  Substance and Sexual Activity  . Alcohol use: Yes    Comment: occ  . Drug use: Never  . Sexual activity: Not on file  Other Topics Concern  . 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    no tobacco   Caffeine Use: very little, three times a week   Left handed    Social Determinants of Health   Financial Resource Strain: Low Risk  (12/27/2021)   Overall Financial Resource Strain (CARDIA)   . Difficulty of Paying Living Expenses: Not hard at all  Food Insecurity: No Food Insecurity (12/27/2021)   Hunger Vital Sign   . Worried About Charity fundraiser in the Last Year: Never true   . Ran Out of Food in the Last Year: Never true  Transportation Needs: No Transportation Needs (12/27/2021)   PRAPARE - Transportation   . Lack of Transportation (Medical): No   . Lack of Transportation (Non-Medical): No  Physical Activity: Insufficiently Active (12/27/2021)   Exercise Vital Sign   . Days of Exercise per Week: 3 days   . Minutes of Exercise per Session: 20 min  Stress: No Stress Concern Present (12/27/2021)   [place]   . Feeling of Stress : Not at all  Social Connections: Moderately Isolated  (12/27/2021)   Social Connection and Isolation Panel [NHANES]   . Frequency of Communication with Friends and Family: More than three times a week   . Frequency of Social Gatherings with Friends and Family: More than three times a week   . Attends Religious Services: Never   . Active Member of Clubs or Organizations: No   . Attends Archivist Meetings: Never   . Marital Status: Married    There were no vitals filed for this visit. There is no height or weight on file to calculate BMI.  Physical Exam  ASSESSMENT AND PLAN:  There are no diagnoses linked to this encounter.   No follow-ups on file.   [name]. [name], MD  [facility]. [place] office.  Discharge Instructions   None

## 2022-02-21 ENCOUNTER — Ambulatory Visit: Payer: Medicare PPO | Admitting: Family Medicine

## 2022-03-10 ENCOUNTER — Telehealth: Payer: Self-pay

## 2022-03-10 NOTE — Telephone Encounter (Signed)
-  Caller states she is having difficulty walking and dizziness. She is feeling a little depressed and feels that she needs some equipment. Wants to take a shower but has no assistance. States she used to have a rollator walker, but no longer has one. Dizziness present last couple months. Reports double vision yesterday morning.  03/08/2022 3:53:30 PM Call EMS 911 Now Glen White, RN, [place]  Comments User: Clemon Chambers, RN Date/Time Eilene Ghazi Time): 03/08/2022 3:42:25 PM states her wife is out of town. reporting confusion in last week.  User: Clemon Chambers, RN Date/Time Eilene Ghazi Time): 03/08/2022 3:44:30 PM states her spouse won't be back home until Tuesday.  User: Clemon Chambers, RN Date/Time Eilene Ghazi Time): 03/08/2022 3:48:21 PM Caller stated her spouse was calling and placed triage RN on hold while discussing plans. States she doesn't have anyone to watch her dogs while she goes to hospital. [facility] is asking for physical therapy personnel or someone to come to her home to help her. Also stated her only family members available currently are female and cannot help her shower.  Referrals GO TO FACILITY REFUSED

## 2022-03-10 NOTE — Telephone Encounter (Signed)
I called and spoke with patient. I offered to order her a new walker and shower chair. Patient declined both stating that she was "fine" for now. Patient will call back if she changes her mind.

## 2022-04-07 DIAGNOSIS — F29 Unspecified psychosis not due to a substance or known physiological condition: Secondary | ICD-10-CM | POA: Diagnosis not present

## 2022-04-17 ENCOUNTER — Other Ambulatory Visit: Payer: Self-pay

## 2022-04-17 ENCOUNTER — Telehealth: Payer: Self-pay | Admitting: Family Medicine

## 2022-04-17 ENCOUNTER — Emergency Department (HOSPITAL_BASED_OUTPATIENT_CLINIC_OR_DEPARTMENT_OTHER): Payer: Medicare PPO

## 2022-04-17 ENCOUNTER — Encounter (HOSPITAL_COMMUNITY): Payer: Self-pay

## 2022-04-17 ENCOUNTER — Emergency Department (HOSPITAL_BASED_OUTPATIENT_CLINIC_OR_DEPARTMENT_OTHER)
Admission: EM | Admit: 2022-04-17 | Discharge: 2022-04-17 | Disposition: A | Discharge status: Home / Self Care | Payer: Medicare PPO | Attending: Emergency Medicine | Admitting: Emergency Medicine

## 2022-04-17 ENCOUNTER — Encounter (HOSPITAL_BASED_OUTPATIENT_CLINIC_OR_DEPARTMENT_OTHER): Payer: Self-pay | Admitting: Emergency Medicine

## 2022-04-17 DIAGNOSIS — Z79899 Other long term (current) drug therapy: Secondary | ICD-10-CM | POA: Insufficient documentation

## 2022-04-17 DIAGNOSIS — R1110 Vomiting, unspecified: Secondary | ICD-10-CM | POA: Insufficient documentation

## 2022-04-17 DIAGNOSIS — E039 Hypothyroidism, unspecified: Secondary | ICD-10-CM | POA: Diagnosis not present

## 2022-04-17 DIAGNOSIS — R42 Dizziness and giddiness: Secondary | ICD-10-CM | POA: Diagnosis not present

## 2022-04-17 DIAGNOSIS — H538 Other visual disturbances: Secondary | ICD-10-CM | POA: Diagnosis not present

## 2022-04-17 DIAGNOSIS — R270 Ataxia, unspecified: Secondary | ICD-10-CM

## 2022-04-17 DIAGNOSIS — Z7984 Long term (current) use of oral hypoglycemic drugs: Secondary | ICD-10-CM | POA: Diagnosis not present

## 2022-04-17 DIAGNOSIS — E119 Type 2 diabetes mellitus without complications: Secondary | ICD-10-CM | POA: Diagnosis not present

## 2022-04-17 LAB — CBC WITH DIFFERENTIAL/PLATELET
Abs Immature Granulocytes: 0.02 10*3/uL (ref 0.00–0.07)
Basophils Absolute: 0 10*3/uL (ref 0.0–0.1)
Basophils Relative: 1 %
Eosinophils Absolute: 0 10*3/uL (ref 0.0–0.5)
Eosinophils Relative: 0 %
HCT: 46.5 % — ABNORMAL HIGH (ref 36.0–46.0)
Hemoglobin: 15.6 g/dL — ABNORMAL HIGH (ref 12.0–15.0)
Immature Granulocytes: 0 %
Lymphocytes Relative: 14 %
Lymphs Abs: 1 10*3/uL (ref 0.7–4.0)
MCH: 31.2 pg (ref 26.0–34.0)
MCHC: 33.5 g/dL (ref 30.0–36.0)
MCV: 93 fL (ref 80.0–100.0)
Monocytes Absolute: 0.3 10*3/uL (ref 0.1–1.0)
Monocytes Relative: 5 %
Neutro Abs: 5.7 10*3/uL (ref 1.7–7.7)
Neutrophils Relative %: 80 %
Platelets: 250 10*3/uL (ref 150–400)
RBC: 5 MIL/uL (ref 3.87–5.11)
RDW: 12.3 % (ref 11.5–15.5)
WBC: 7.1 10*3/uL (ref 4.0–10.5)
nRBC: 0 % (ref 0.0–0.2)

## 2022-04-17 LAB — URINALYSIS, ROUTINE W REFLEX MICROSCOPIC
Bilirubin Urine: NEGATIVE
Glucose, UA: 100 mg/dL — AB
Hgb urine dipstick: NEGATIVE
Ketones, ur: 40 mg/dL — AB
Leukocytes,Ua: NEGATIVE
Nitrite: NEGATIVE
Protein, ur: NEGATIVE mg/dL
Specific Gravity, Urine: 1.02 (ref 1.005–1.030)
pH: 7 (ref 5.0–8.0)

## 2022-04-17 LAB — COMPREHENSIVE METABOLIC PANEL
ALT: 46 U/L — ABNORMAL HIGH (ref 0–44)
AST: 36 U/L (ref 15–41)
Albumin: 4.2 g/dL (ref 3.5–5.0)
Alkaline Phosphatase: 86 U/L (ref 38–126)
Anion gap: 9 (ref 5–15)
BUN: 12 mg/dL (ref 6–20)
CO2: 23 mmol/L (ref 22–32)
Calcium: 9.5 mg/dL (ref 8.9–10.3)
Chloride: 104 mmol/L (ref 98–111)
Creatinine, Ser: 0.77 mg/dL (ref 0.44–1.00)
GFR, Estimated: >60 mL/min (ref >60)
Glucose, Bld: 167 mg/dL — ABNORMAL HIGH (ref 70–99)
Potassium: 4.3 mmol/L (ref 3.5–5.1)
Sodium: 136 mmol/L (ref 135–145)
Total Bilirubin: 1.4 mg/dL — ABNORMAL HIGH (ref 0.3–1.2)
Total Protein: 8.1 g/dL (ref 6.5–8.1)

## 2022-04-17 LAB — LIPASE, BLOOD: Lipase: 25 U/L (ref 11–51)

## 2022-04-17 MED ORDER — PROCHLORPERAZINE EDISYLATE 10 MG/2ML IJ SOLN
10.0000 mg | Freq: Once | INTRAMUSCULAR | Status: AC
  Administered 2022-04-17: 10 mg via INTRAVENOUS
  Filled 2022-04-17: qty 2

## 2022-04-17 MED ORDER — ONDANSETRON 4 MG PO TBDP
4.0000 mg | ORAL_TABLET | Freq: Once | ORAL | Status: AC | PRN
  Administered 2022-04-17: 4 mg via ORAL
  Filled 2022-04-17: qty 1

## 2022-04-17 MED ORDER — DIPHENHYDRAMINE HCL 50 MG/ML IJ SOLN
25.0000 mg | Freq: Once | INTRAMUSCULAR | Status: AC
  Administered 2022-04-17: 25 mg via INTRAVENOUS
  Filled 2022-04-17: qty 1

## 2022-04-17 MED ORDER — SODIUM CHLORIDE 0.9 % IV BOLUS
1000.0000 mL | Freq: Once | INTRAVENOUS | Status: AC
  Administered 2022-04-17: 1000 mL via INTRAVENOUS

## 2022-04-17 NOTE — ED Notes (Signed)
Pt sleeping good

## 2022-04-17 NOTE — Telephone Encounter (Signed)
Patient was sent to triage nurse.Having dizziness, nausea/vomiting, weakness/ fatigue wants medication but was told to be seen at [facility] or Ed. Patient refused, only wants medication. Patient states symptoms have been going on for past 6 months. Patient is requesting promethazine (PHENERGAN) 25 mg in sodium chloride 0.9 % 50 mL IVPB         Please advise

## 2022-04-17 NOTE — ED Triage Notes (Signed)
Pt c/o dizziness, nausea, and blurry vision since this morning. Recent travel to [place]. Denies pain, diarrhea, cough, congestion, fevers.

## 2022-04-17 NOTE — Discharge Instructions (Addendum)
Please read and follow all provided instructions.  Your diagnoses today include:  1. Ataxia   2. Vertigo     Tests performed today include: CT scan: Does not show any new problems, but cannot rule out definitively a stroke in the brain Complete blood cell count: no problems Complete metabolic panel: no problems Lipase (pancreas function test): normal Urine test: no definite infection Vital signs. See below for your results today.   Medications prescribed:  None  Take any prescribed medications only as directed.  Home care instructions:  Follow any educational materials contained in this packet.  BE VERY CAREFUL not to take multiple medicines containing Tylenol (also called acetaminophen). Doing so can lead to an overdose which can damage your liver and cause liver failure and possibly death.   Follow-up instructions: Please follow-up with your primary care provider in the next 3 days for further evaluation of your symptoms.   I have also placed another referral to neurology on your behalf.  As we discussed, it is extremely difficult that you follow-up with them when able for evaluation of your balance and gait changes.  Return instructions:  Please return to the Emergency Department if you experience worsening symptoms.  Return if you have worsening weakness in your arms or legs, slurred speech, trouble walking or talking, confusion, or trouble with your balance.  Please return if you have any other emergent concerns.  Additional Information:  Your vital signs today were: BP 109/66   Pulse 82   Temp 98.5 F (36.9 C) (Oral)   Resp 16   Ht 5' (1.524 m)   Wt 68 kg   SpO2 95%   BMI 29.29 kg/m  If your blood pressure (BP) was elevated above 135/85 this visit, please have this repeated by your doctor within one month. --------------

## 2022-04-17 NOTE — ED Provider Notes (Incomplete)
[facility] EMERGENCY DEPARTMENT Provider Note   [name]: [id number] Arrival date & time: 04/17/22  1443     History {Add pertinent medical, surgical, social history, OB history to HPI:1} Chief Complaint  Patient presents with  . Emesis    [name] is a 61 y.o. female.  Patient with h/o diabetes, postsurgical hypothyroidism, bipolar type schizophrenic disorder, GERD -- presents the emergency department today for evaluation of vertigo and vomiting.  She describes a spinning sensation with several episodes of vomiting over the past day.  No severe headache.  She has had blurry vision but no loss of vision. Patient denies signs of stroke including: facial droop, slurred speech, aphasia, weakness/numbness in extremities, imbalance/trouble walking.  Denies abdominal pain, worse than baseline occasional diarrhea.  In addition, patient and family member bedside report imbalance which they say have been going on for several months.  PCP note from May/2023 notes that it was reported at that time, symptoms started several weeks prior, so onset may be more like April 2023.  Patient reports imbalance with walking, occurring constantly.  She has been referred to neurology but does not have an appointment for several months per her report.  She is able to walk, but has difficulty walking long distances because of the imbalance.  She does not typically have vertigo with this.  They report recent trip to [place], to the [place].  They needed to use a taxi to go everywhere because the patient cannot walk and when walking, needs to hold onto things to help keep her balance.  MRI brain 10/2020:  IMPRESSION: No acute or reversible finding. Mild to moderate chronic small-vessel ischemic changes of the cerebral hemispheric white matter. Generalized volume loss, slightly advanced for age.       Home Medications Prior to Admission medications   Medication Sig Start Date End Date Taking?  Authorizing Provider  acetaminophen (TYLENOL) 325 MG tablet Take 650 mg by mouth every 6 (six) hours as needed for mild pain, fever or headache.    [provider]  albuterol (VENTOLIN HFA) 108 (90 Base) MCG/ACT inhaler Inhale 2 puffs into the lungs every 6 (six) hours as needed for wheezing or shortness of breath. 08/14/20 08/14/21  [name], [name], MD  aspirin-acetaminophen-caffeine (EXCEDRIN MIGRAINE) [phone] MG tablet Take 2 tablets by mouth every 6 (six) hours as needed for headache.    [provider]  Blood Glucose Monitoring Suppl (ONE TOUCH ULTRA 2) w/Device KIT Use to test blood sugars 1-2 times daily. 10/07/21   [name], [name], MD  buPROPion (WELLBUTRIN XL) 150 MG 24 hr tablet Take 450 mg by mouth every morning. 11/03/19   [provider]  calcium carbonate (TUMS) 500 MG chewable tablet Chew 2 tablets (400 mg of elemental calcium total) by mouth 3 (three) times daily. 05/24/21   Armandina Gemma, MD  divalproex (DEPAKOTE ER) 250 MG 24 hr tablet Take 750 mg by mouth at bedtime. 11/20/20   [provider]  glucose blood (ONETOUCH ULTRA) test strip Use to test blood sugars 1-2 times daily. 10/07/21   [name], [name], MD  Lancets [facility] ULTRASOFT) lancets Use to test blood sugars 1-2 times daily. 10/07/21   [name], [name], MD  levothyroxine (SYNTHROID) 100 MCG tablet Take 1 tablet (100 mcg total) by mouth daily. 10/10/21   Shamleffer, Melanie Crazier, MD  LORazepam (ATIVAN) 0.5 MG tablet Take 0.5-1 mg by mouth daily as needed for anxiety.    [provider]  metFORMIN (GLUCOPHAGE) 500 MG tablet Take 1 tablet (500 mg total) by mouth 2 (two) times daily with a meal. 10/10/21   Shamleffer, Melanie Crazier, MD  Multiple Vitamin (MULTIVITAMIN WITH MINERALS) TABS tablet Take 1 tablet by mouth daily.    [provider]  pravastatin (PRAVACHOL) 20 MG tablet Take 1 tablet by mouth once daily 02/03/22   [name], [name], MD      Allergies    Morphine and  related, Ambien [zolpidem], Duloxetine, Latuda [lurasidone hcl], Morphine, and Shingrix [zoster vac recomb adjuvanted]    Review of Systems   Review of Systems  Physical Exam Updated Vital Signs BP 139/78   Pulse 88   Temp 98.3 F (36.8 C) (Oral)   Resp 14   Ht 5' (1.524 m)   Wt 68 kg   SpO2 96%   BMI 29.29 kg/m   Physical Exam Vitals and nursing note reviewed.  Constitutional:      Appearance: She is well-developed.  HENT:     Head: Normocephalic and atraumatic.     Right Ear: Tympanic membrane, ear canal and external ear normal.     Left Ear: Tympanic membrane, ear canal and external ear normal.     Nose: Nose normal.     Mouth/Throat:     Pharynx: Uvula midline.  Eyes:     General: Lids are normal.     Extraocular Movements:     Right eye: Nystagmus present.     Left eye: Nystagmus present.     Conjunctiva/sclera: Conjunctivae normal.     Pupils: Pupils are equal, round, and reactive to light.     Comments: A few beats of horizontal nystagmus noted.   Cardiovascular:     Rate and Rhythm: Normal rate and regular rhythm.  Pulmonary:     Effort: Pulmonary effort is normal.     Breath sounds: Normal breath sounds.  Abdominal:     Palpations: Abdomen is soft.     Tenderness: There is no abdominal tenderness.  Musculoskeletal:     Cervical back: Normal range of motion and neck supple. No tenderness or bony tenderness.  Skin:    General: Skin is warm and dry.  Neurological:     Mental Status: She is alert and oriented to person, place, and time.     GCS: GCS eye subscore is 4. GCS verbal subscore is 5. GCS motor subscore is 6.     Cranial Nerves: No cranial nerve deficit, dysarthria or facial asymmetry.     Sensory: No sensory deficit.     Motor: No weakness or pronator drift.     Coordination: Romberg sign positive. Coordination normal. Finger-Nose-Finger Test normal.     Gait: Gait abnormal (wide based).     Comments: Upper extremity myotomes tested bilaterally:   C5 Shoulder abduction 5/5 C6 Elbow flexion/wrist extension 5/5 C7 Elbow extension 5/5 C8 Finger flexion 5/5 T1 Finger abduction 5/5  Lower extremity myotomes tested bilaterally: L2 Hip flexion 5/5 L3 Knee extension 5/5 L4 Ankle dorsiflexion 5/5 S1 Ankle plantar flexion 5/5      ED Results / Procedures / Treatments   Labs (all labs ordered are listed, but only abnormal results are displayed) Labs Reviewed  COMPREHENSIVE METABOLIC PANEL - Abnormal; Notable for the following components:      Result Value   Glucose, Bld 167 (*)    ALT 46 (*)    Total Bilirubin 1.4 (*)    All other components within normal limits  URINALYSIS, ROUTINE W  REFLEX MICROSCOPIC - Abnormal; Notable for the following components:   Glucose, UA 100 (*)    Ketones, ur 40 (*)    All other components within normal limits  CBC WITH DIFFERENTIAL/PLATELET - Abnormal; Notable for the following components:   Hemoglobin 15.6 (*)    HCT 46.5 (*)    All other components within normal limits  LIPASE, BLOOD    EKG EKG Interpretation  Date/Time:  Thursday April 17 2022 15:18:29 EDT Ventricular Rate:  87 PR Interval:  168 QRS Duration: 84 QT Interval:  388 QTC Calculation: 466 R Axis:   50 Text Interpretation: Normal sinus rhythm Nonspecific ST abnormality Abnormal ECG No significant change since last tracing Confirmed by Deno Etienne [phone]) on 04/17/2022 3:47:45 PM  Radiology CT Head Wo Contrast  Result Date: 04/17/2022 CLINICAL DATA:  Ataxia or coordination problem (Ped 0-17y) vertigo/vomiting today, ataxia for several months EXAM: CT HEAD WITHOUT CONTRAST TECHNIQUE: Contiguous axial images were obtained from the base of the skull through the vertex without intravenous contrast. RADIATION DOSE REDUCTION: This exam was performed according to the departmental dose-optimization program which includes automated exposure control, adjustment of the mA and/or kV according to patient size and/or use of iterative  reconstruction technique. COMPARISON:  None Available. FINDINGS: Brain: Normal anatomic configuration. Mild parenchymal volume loss is commensurate with the patient's age and stable since prior examination. Mild periventricular white matter changes are present likely reflecting the sequela of small vessel ischemia. No abnormal intra or extra-axial mass lesion or fluid collection. No abnormal mass effect or midline shift. No evidence of acute intracranial hemorrhage or infarct. Ventricular size is normal. Cerebellum unremarkable. Vascular: Unremarkable Skull: Intact Sinuses/Orbits: Paranasal sinuses are clear. Orbits are unremarkable. Other: Mastoid air cells and middle ear cavities are clear. IMPRESSION: No acute intracranial abnormality. Stable senescent changes. Electronically Signed   By: Fidela Salisbury M.D.   On: 04/17/2022 19:20    Procedures Procedures  {Document cardiac monitor, telemetry assessment procedure when appropriate:1}  Medications Ordered in ED Medications  ondansetron (ZOFRAN-ODT) disintegrating tablet 4 mg (4 mg Oral Given 04/17/22 1529)  prochlorperazine (COMPAZINE) injection 10 mg (10 mg Intravenous Given 04/17/22 1842)  diphenhydrAMINE (BENADRYL) injection 25 mg (25 mg Intravenous Given 04/17/22 1842)  sodium chloride 0.9 % bolus 1,000 mL (0 mLs Intravenous Stopped 04/17/22 1953)    ED Course/ Medical Decision Making/ A&P    Patient seen and examined. History obtained directly from patient and family member at bedside, reviewed PCP note from May 2023. Work-up including labs, imaging, EKG ordered in triage, if performed, were reviewed.    Labs/EKG: Independently reviewed and interpreted.  This included: CBC with differential showed normal white blood cell count, mildly elevated hemoglobin of 15.6 likely concentrated; CMP with normal electrolytes, glucose 167, normal creatinine and BUN; lipase normal; UA with some glucose, 40 ketones, no compelling signs of infection.  Imaging:  Ordered CT head without contrast.  Medications/Fluids: Ordered: IV Compazine and Benadryl for vertigo, IV fluid bolus  Most recent vital signs reviewed and are as follows: BP 139/78   Pulse 88   Temp 98.5 F (36.9 C) (Oral)   Resp 14   Ht 5' (1.524 m)   Wt 68 kg   SpO2 96%   BMI 29.29 kg/m   Initial impression: Ataxia which is chronic over the past several months, vertigo and vomiting with reassuring abdominal exam is chief complaint today.   Reassessment performed. Patient appears stable. States she is feeling a little bit better, but still has  some vertigo symptoms. Up to restroom with some assistance. She is unsteady.   Imaging personally visualized and interpreted including: CT head, agree stable from previous without acute findings  Reviewed pertinent lab work and imaging with patient at bedside. Questions answered.   Most current vital signs reviewed and are as follows: BP 109/66 (BP Location: Right Arm)   Pulse 82   Temp 98.5 F (36.9 C) (Oral)   Resp 16   Ht 5' (1.524 m)   Wt 68 kg   SpO2 95%   BMI 29.29 kg/m   Plan: Discussed case with Dr. Tyrone Nine who will see.    Case discussed with Dr. Tyrone Nine. Will plan to speak with neuro about case for reccs. Will continue to monitor and reassess.    Spoke with Dr. Lorrin Goodell by telephone. Reccs that if we are concerned about acute CVA as cause of vertigo, would perform Hintz exam or send for MRI brain to r/o acute CVA if unable/uncomfortable with this exam.   11:59 PM Reassessment performed. Patient is sleepy but appears stable. She states that she is actually feeling better.   I had a good discussion with patient and family member at bedside.  We went over with her chronic and acute concerns today.  We went over her work-up today as well as recommendations from all involved, including getting MRI of the brain to evaluate for posterior circulation stroke.  We discussed that without further testing, we cannot completely rule  out the possibility of a stroke causing the acute vertigo symptoms today.  Also discussed at length that patient will need  Labs personally reviewed and interpreted including:  Imaging personally visualized and interpreted including:  Reviewed pertinent lab work and imaging with patient at bedside. Questions answered.   Most current vital signs reviewed and are as follows: BP 109/66 (BP Location: Right Arm)   Pulse 82   Temp 98.5 F (36.9 C) (Oral)   Resp 16   Ht 5' (1.524 m)   Wt 68 kg   SpO2 95%   BMI 29.29 kg/m   Plan: Discharge to home.   Prescriptions written for:   Other home care instructions discussed:   ED return instructions discussed:   Follow-up instructions discussed: Patient encouraged to follow-up with their PCP in *** days.                                 Medical Decision Making Amount and/or Complexity of Data Reviewed Labs: ordered. Radiology: ordered.  Risk Prescription drug management.   ***  {Document critical care time when appropriate:1} {Document review of labs and clinical decision tools ie heart score, Chads2Vasc2 etc:1}  {Document your independent review of radiology images, and any outside records:1} {Document your discussion with family members, caretakers, and with consultants:1} {Document social determinants of health affecting pt's care:1} {Document your decision making why or why not admission, treatments were needed:1} Final Clinical Impression(s) / ED Diagnoses Final diagnoses:  None    Rx / DC Orders ED Discharge Orders     None

## 2022-04-17 NOTE — ED Provider Notes (Signed)
[facility] EMERGENCY DEPARTMENT Provider Note   [name]: [id number] Arrival date & time: 04/17/22  1443     History  Chief Complaint  Patient presents with   Emesis    [name] is a 61 y.o. female.  Patient with h/o diabetes, postsurgical hypothyroidism, bipolar type schizophrenic disorder, GERD -- presents the emergency department today for evaluation of vertigo and vomiting.  She describes a spinning sensation with several episodes of vomiting over the past day.  No severe headache.  She has had blurry vision but no loss of vision. Patient denies signs of stroke including: facial droop, slurred speech, aphasia, weakness/numbness in extremities, imbalance/trouble walking.  Denies abdominal pain, worse than baseline occasional diarrhea.  In addition, patient and family member bedside report imbalance which they say have been going on for several months.  PCP note from May/2023 notes that it was reported at that time, symptoms started several weeks prior, so onset may be more like April 2023.  Patient reports imbalance with walking, occurring constantly.  She has been referred to neurology but does not have an appointment for several months per her report.  She is able to walk, but has difficulty walking long distances because of the imbalance.  She does not typically have vertigo with this.  They report recent trip to [place], to the [place].  They needed to use a taxi to go everywhere because the patient cannot walk and when walking, needs to hold onto things to help keep her balance.  MRI brain 10/2020:  IMPRESSION: No acute or reversible finding. Mild to moderate chronic small-vessel ischemic changes of the cerebral hemispheric white matter. Generalized volume loss, slightly advanced for age.       Home Medications Prior to Admission medications   Medication Sig Start Date End Date Taking? Authorizing Provider  acetaminophen (TYLENOL) 325 MG tablet Take 650 mg by  mouth every 6 (six) hours as needed for mild pain, fever or headache.    [provider]  albuterol (VENTOLIN HFA) 108 (90 Base) MCG/ACT inhaler Inhale 2 puffs into the lungs every 6 (six) hours as needed for wheezing or shortness of breath. 08/14/20 08/14/21  [name], [name], MD  aspirin-acetaminophen-caffeine (EXCEDRIN MIGRAINE) [phone] MG tablet Take 2 tablets by mouth every 6 (six) hours as needed for headache.    [provider]  Blood Glucose Monitoring Suppl (ONE TOUCH ULTRA 2) w/Device KIT Use to test blood sugars 1-2 times daily. 10/07/21   [name], [name], MD  buPROPion (WELLBUTRIN XL) 150 MG 24 hr tablet Take 450 mg by mouth every morning. 11/03/19   [provider]  calcium carbonate (TUMS) 500 MG chewable tablet Chew 2 tablets (400 mg of elemental calcium total) by mouth 3 (three) times daily. 05/24/21   Armandina Gemma, MD  divalproex (DEPAKOTE ER) 250 MG 24 hr tablet Take 750 mg by mouth at bedtime. 11/20/20   [provider]  glucose blood (ONETOUCH ULTRA) test strip Use to test blood sugars 1-2 times daily. 10/07/21   [name], [name], MD  Lancets [facility] ULTRASOFT) lancets Use to test blood sugars 1-2 times daily. 10/07/21   [name], [name], MD  levothyroxine (SYNTHROID) 100 MCG tablet Take 1 tablet (100 mcg total) by mouth daily. 10/10/21   Shamleffer, Melanie Crazier, MD  LORazepam (ATIVAN) 0.5 MG tablet Take 0.5-1 mg by mouth daily as needed for anxiety.    [provider]  metFORMIN (GLUCOPHAGE) 500 MG tablet Take 1 tablet (500  mg total) by mouth 2 (two) times daily with a meal. 10/10/21   Shamleffer, Melanie Crazier, MD  Multiple Vitamin (MULTIVITAMIN WITH MINERALS) TABS tablet Take 1 tablet by mouth daily.    [provider]  pravastatin (PRAVACHOL) 20 MG tablet Take 1 tablet by mouth once daily 02/03/22   [name], [name], MD      Allergies    Morphine and related, Ambien [zolpidem], Duloxetine, Latuda [lurasidone hcl], Morphine,  and Shingrix [zoster vac recomb adjuvanted]    Review of Systems   Review of Systems  Physical Exam Updated Vital Signs BP 139/78   Pulse 88   Temp 98.3 F (36.8 C) (Oral)   Resp 14   Ht 5' (1.524 m)   Wt 68 kg   SpO2 96%   BMI 29.29 kg/m   Physical Exam Vitals and nursing note reviewed.  Constitutional:      Appearance: She is well-developed.  HENT:     Head: Normocephalic and atraumatic.     Right Ear: Tympanic membrane, ear canal and external ear normal.     Left Ear: Tympanic membrane, ear canal and external ear normal.     Nose: Nose normal.     Mouth/Throat:     Pharynx: Uvula midline.  Eyes:     General: Lids are normal.     Extraocular Movements:     Right eye: Nystagmus present.     Left eye: Nystagmus present.     Conjunctiva/sclera: Conjunctivae normal.     Pupils: Pupils are equal, round, and reactive to light.     Comments: A few beats of horizontal nystagmus noted.   Cardiovascular:     Rate and Rhythm: Normal rate and regular rhythm.  Pulmonary:     Effort: Pulmonary effort is normal.     Breath sounds: Normal breath sounds.  Abdominal:     Palpations: Abdomen is soft.     Tenderness: There is no abdominal tenderness.  Musculoskeletal:     Cervical back: Normal range of motion and neck supple. No tenderness or bony tenderness.  Skin:    General: Skin is warm and dry.  Neurological:     Mental Status: She is alert and oriented to person, place, and time.     GCS: GCS eye subscore is 4. GCS verbal subscore is 5. GCS motor subscore is 6.     Cranial Nerves: No cranial nerve deficit, dysarthria or facial asymmetry.     Sensory: No sensory deficit.     Motor: No weakness or pronator drift.     Coordination: Romberg sign positive. Coordination normal. Finger-Nose-Finger Test normal.     Gait: Gait abnormal (wide based).     Comments: Upper extremity myotomes tested bilaterally:  C5 Shoulder abduction 5/5 C6 Elbow flexion/wrist extension 5/5 C7  Elbow extension 5/5 C8 Finger flexion 5/5 T1 Finger abduction 5/5  Lower extremity myotomes tested bilaterally: L2 Hip flexion 5/5 L3 Knee extension 5/5 L4 Ankle dorsiflexion 5/5 S1 Ankle plantar flexion 5/5      ED Results / Procedures / Treatments   Labs (all labs ordered are listed, but only abnormal results are displayed) Labs Reviewed  COMPREHENSIVE METABOLIC PANEL - Abnormal; Notable for the following components:      Result Value   Glucose, Bld 167 (*)    ALT 46 (*)    Total Bilirubin 1.4 (*)    All other components within normal limits  URINALYSIS, ROUTINE W REFLEX MICROSCOPIC - Abnormal; Notable for the following components:  Glucose, UA 100 (*)    Ketones, ur 40 (*)    All other components within normal limits  CBC WITH DIFFERENTIAL/PLATELET - Abnormal; Notable for the following components:   Hemoglobin 15.6 (*)    HCT 46.5 (*)    All other components within normal limits  LIPASE, BLOOD    EKG EKG Interpretation  Date/Time:  Thursday April 17 2022 15:18:29 EDT Ventricular Rate:  87 PR Interval:  168 QRS Duration: 84 QT Interval:  388 QTC Calculation: 466 R Axis:   50 Text Interpretation: Normal sinus rhythm Nonspecific ST abnormality Abnormal ECG No significant change since last tracing Confirmed by Deno Etienne [phone]) on 04/17/2022 3:47:45 PM  Radiology CT Head Wo Contrast  Result Date: 04/17/2022 CLINICAL DATA:  Ataxia or coordination problem (Ped 0-17y) vertigo/vomiting today, ataxia for several months EXAM: CT HEAD WITHOUT CONTRAST TECHNIQUE: Contiguous axial images were obtained from the base of the skull through the vertex without intravenous contrast. RADIATION DOSE REDUCTION: This exam was performed according to the departmental dose-optimization program which includes automated exposure control, adjustment of the mA and/or kV according to patient size and/or use of iterative reconstruction technique. COMPARISON:  None Available. FINDINGS: Brain:  Normal anatomic configuration. Mild parenchymal volume loss is commensurate with the patient's age and stable since prior examination. Mild periventricular white matter changes are present likely reflecting the sequela of small vessel ischemia. No abnormal intra or extra-axial mass lesion or fluid collection. No abnormal mass effect or midline shift. No evidence of acute intracranial hemorrhage or infarct. Ventricular size is normal. Cerebellum unremarkable. Vascular: Unremarkable Skull: Intact Sinuses/Orbits: Paranasal sinuses are clear. Orbits are unremarkable. Other: Mastoid air cells and middle ear cavities are clear. IMPRESSION: No acute intracranial abnormality. Stable senescent changes. Electronically Signed   By: Fidela Salisbury M.D.   On: 04/17/2022 19:20    Procedures Procedures    Medications Ordered in ED Medications  ondansetron (ZOFRAN-ODT) disintegrating tablet 4 mg (4 mg Oral Given 04/17/22 1529)  prochlorperazine (COMPAZINE) injection 10 mg (10 mg Intravenous Given 04/17/22 1842)  diphenhydrAMINE (BENADRYL) injection 25 mg (25 mg Intravenous Given 04/17/22 1842)  sodium chloride 0.9 % bolus 1,000 mL (0 mLs Intravenous Stopped 04/17/22 1953)    ED Course/ Medical Decision Making/ A&P    Patient seen and examined. History obtained directly from patient and family member at bedside, reviewed PCP note from May 2023. Work-up including labs, imaging, EKG ordered in triage, if performed, were reviewed.    Labs/EKG: Independently reviewed and interpreted.  This included: CBC with differential showed normal white blood cell count, mildly elevated hemoglobin of 15.6 likely concentrated; CMP with normal electrolytes, glucose 167, normal creatinine and BUN; lipase normal; UA with some glucose, 40 ketones, no compelling signs of infection.  Imaging: Ordered CT head without contrast.  Medications/Fluids: Ordered: IV Compazine and Benadryl for vertigo, IV fluid bolus  Most recent vital signs  reviewed and are as follows: BP 139/78   Pulse 88   Temp 98.5 F (36.9 C) (Oral)   Resp 14   Ht 5' (1.524 m)   Wt 68 kg   SpO2 96%   BMI 29.29 kg/m   Initial impression: Ataxia which is chronic over the past several months, vertigo and vomiting with reassuring abdominal exam is chief complaint today.   Reassessment performed. Patient appears stable. States she is feeling a little bit better, but still has some vertigo symptoms. Up to restroom with some assistance. She is unsteady.   Imaging personally visualized and  interpreted including: CT head, agree stable from previous without acute findings  Reviewed pertinent lab work and imaging with patient at bedside. Questions answered.   Most current vital signs reviewed and are as follows: BP 109/66 (BP Location: Right Arm)   Pulse 82   Temp 98.5 F (36.9 C) (Oral)   Resp 16   Ht 5' (1.524 m)   Wt 68 kg   SpO2 95%   BMI 29.29 kg/m   Plan: Discussed case with Dr. Tyrone Nine who will see.    Case discussed with Dr. Tyrone Nine. Will plan to speak with neuro about case for reccs. Will continue to monitor and reassess.    Spoke with Dr. Lorrin Goodell by telephone. Reccs that if we are concerned about acute CVA as cause of vertigo, would perform Hintz exam or send for MRI brain to r/o acute CVA if unable/uncomfortable with this exam.   11:59 PM Reassessment performed. Patient is sleepy but appears stable. She states that she is actually feeling better.   I had a good discussion with patient and family member at bedside.  We went over both her chronic and acute concerns today.  We went over her work-up as well as recommendations from all involved in care tonight, including getting MRI of the brain to evaluate for posterior circulation stroke.  We discussed that without further testing, we cannot completely rule out the possibility of a stroke causing the acute vertigo symptoms today.  She could be at risk of a more severe, debilitating stroke if  this is the cause of her symptom tonight. Also discussed at length that patient will need neurology follow-up, hopefully before December for her ongoing ataxia and gait instability.  We discussed the risks of foregoing additional evaluation tonight.  I asked the patient what she would prefer to do and she clearly states that she just wants to go home tonight.  She voices that she is comfortable with this because she is feeling better.  I asked the patient's family member at bedside if she would be comfortable with her going home, and she states that she agrees to do what the patient wants.  She will also be available to watch the patient closely.  They are both willing to return to the emergency department immediately if symptoms change or acutely worsen.  They seem reliable to do this in my opinion.  Patient's family member has been assisting her with ambulation over the past several months and will continue to do so.  She does not have any concerns about caring for her at home.  She then recounts the recent visit to [place] and the difficulties that she had with that trip.  They managed to get through that.  Reviewed pertinent lab work and imaging with patient at bedside. Questions answered.   Most current vital signs reviewed and are as follows: BP 109/66 (BP Location: Right Arm)   Pulse 82   Temp 98.5 F (36.9 C) (Oral)   Resp 16   Ht 5' (1.524 m)   Wt 68 kg   SpO2 95%   BMI 29.29 kg/m   Plan: Discharge to home.   Prescriptions written for: None  Other home care instructions discussed: Close monitoring  ED return instructions discussed: Patient counseled to return if they have acute worsening of current symptoms or new symptoms including weakness in their arms or legs, slurred speech, worsening trouble walking or talking, confusion, or if they have any other concerns. Patient verbalizes understanding and agrees with  plan.   Follow-up instructions discussed: Patient encouraged to  follow-up with their PCP in 3 days for recheck.  We will also need neurology follow-up.  I have placed ambulatory referral for urgent follow-up through epic.                           Medical Decision Making Amount and/or Complexity of Data Reviewed Labs: ordered. Radiology: ordered.  Risk Prescription drug management.   Patient with complicated clinical picture today.  She has had worsening ataxia and imbalance over the past several months, at least since April.  This seems to be at her recent baseline.  She presents today due to new vertigo and vomiting.  The symptoms have been treated and are generally improved in the emergency department.  Patient has a nonfocal neuro exam.  CT of the head was negative.  Discussed that we cannot rule out a posterior circulation stroke without MRI.  Patient feels comfortable with going home as she is improving, and does not want to be transferred for MRI at this time.  Patient does have good support at home and close monitoring.  Patient and family seem reliable to come back if something gets worse.  Differential includes acute stroke, benign peripheral vertigo.  Low concern for meningitis or encephalitis.  Low concern for Guillain-Barr syndrome given duration of symptoms.  The patient's vital signs, pertinent lab work and imaging were reviewed and interpreted as discussed in the ED course. Hospitalization was considered for further testing, treatments, or serial exams/observation. However as patient is well-appearing, has an improving exam, and reassuring studies today, I do not feel that they warrant admission at this time. This plan was discussed with the patient and family who verbalize agreement and comfort with this plan and seem reliable and able to return to the Emergency Department with worsening or changing symptoms.          Final Clinical Impression(s) / ED Diagnoses Final diagnoses:  Ataxia  Vertigo    Rx / DC Orders ED Discharge  Orders          Ordered    Ambulatory referral to Neurology       Comments: An appointment is requested in approximately: 1 week for worsening ataxia over several months, new vertigo symptoms   04/17/22 2311              Carlisle Cater, PA-C 04/18/22 0015    Deno Etienne, DO 04/18/22 2300

## 2022-04-17 NOTE — ED Notes (Signed)
Pt states dizzy x 2 months states saw PCP and is trying for appointment to get into neurologist , pt states has  been vomiting a lot today states just got back from [place] yesterday

## 2022-04-18 ENCOUNTER — Telehealth: Payer: Self-pay | Admitting: Neurology

## 2022-04-18 NOTE — Telephone Encounter (Signed)
See referral notes.

## 2022-04-18 NOTE — Telephone Encounter (Signed)
Patent's wife called and patient was seen at the ED yesterday and advised to follow up in a week for vertigo.  Sending to clinical for review and for provider to advise on scheduling.

## 2022-04-18 NOTE — Telephone Encounter (Signed)
Pt went to ED yesterday 04/17/22

## 2022-04-21 ENCOUNTER — Encounter (HOSPITAL_COMMUNITY): Payer: Self-pay | Admitting: Emergency Medicine

## 2022-04-21 ENCOUNTER — Emergency Department (HOSPITAL_COMMUNITY)
Admission: EM | Admit: 2022-04-21 | Discharge: 2022-04-22 | Disposition: A | Discharge status: Home / Self Care | Payer: Medicare PPO | Attending: Emergency Medicine | Admitting: Emergency Medicine

## 2022-04-21 ENCOUNTER — Other Ambulatory Visit: Payer: Self-pay

## 2022-04-21 DIAGNOSIS — M79652 Pain in left thigh: Secondary | ICD-10-CM | POA: Insufficient documentation

## 2022-04-21 DIAGNOSIS — M4802 Spinal stenosis, cervical region: Secondary | ICD-10-CM | POA: Diagnosis not present

## 2022-04-21 DIAGNOSIS — R42 Dizziness and giddiness: Secondary | ICD-10-CM

## 2022-04-21 DIAGNOSIS — Z7984 Long term (current) use of oral hypoglycemic drugs: Secondary | ICD-10-CM | POA: Diagnosis not present

## 2022-04-21 DIAGNOSIS — R2681 Unsteadiness on feet: Secondary | ICD-10-CM

## 2022-04-21 DIAGNOSIS — R2689 Other abnormalities of gait and mobility: Secondary | ICD-10-CM | POA: Insufficient documentation

## 2022-04-21 DIAGNOSIS — M4317 Spondylolisthesis, lumbosacral region: Secondary | ICD-10-CM | POA: Diagnosis not present

## 2022-04-21 DIAGNOSIS — I6782 Cerebral ischemia: Secondary | ICD-10-CM | POA: Diagnosis not present

## 2022-04-21 DIAGNOSIS — M5116 Intervertebral disc disorders with radiculopathy, lumbar region: Secondary | ICD-10-CM | POA: Diagnosis not present

## 2022-04-21 DIAGNOSIS — M5412 Radiculopathy, cervical region: Secondary | ICD-10-CM | POA: Diagnosis not present

## 2022-04-21 LAB — CBC WITH DIFFERENTIAL/PLATELET
Abs Immature Granulocytes: 0.03 10*3/uL (ref 0.00–0.07)
Basophils Absolute: 0.1 10*3/uL (ref 0.0–0.1)
Basophils Relative: 1 %
Eosinophils Absolute: 0.4 10*3/uL (ref 0.0–0.5)
Eosinophils Relative: 6 %
HCT: 44.2 % (ref 36.0–46.0)
Hemoglobin: 14.6 g/dL (ref 12.0–15.0)
Immature Granulocytes: 0 %
Lymphocytes Relative: 25 %
Lymphs Abs: 1.8 10*3/uL (ref 0.7–4.0)
MCH: 32 pg (ref 26.0–34.0)
MCHC: 33 g/dL (ref 30.0–36.0)
MCV: 96.9 fL (ref 80.0–100.0)
Monocytes Absolute: 0.8 10*3/uL (ref 0.1–1.0)
Monocytes Relative: 11 %
Neutro Abs: 4.2 10*3/uL (ref 1.7–7.7)
Neutrophils Relative %: 57 %
Platelets: 233 10*3/uL (ref 150–400)
RBC: 4.56 MIL/uL (ref 3.87–5.11)
RDW: 12.4 % (ref 11.5–15.5)
WBC: 7.3 10*3/uL (ref 4.0–10.5)
nRBC: 0 % (ref 0.0–0.2)

## 2022-04-21 LAB — BASIC METABOLIC PANEL
Anion gap: 12 (ref 5–15)
BUN: 18 mg/dL (ref 6–20)
CO2: 21 mmol/L — ABNORMAL LOW (ref 22–32)
Calcium: 9.3 mg/dL (ref 8.9–10.3)
Chloride: 108 mmol/L (ref 98–111)
Creatinine, Ser: 0.89 mg/dL (ref 0.44–1.00)
GFR, Estimated: >60 mL/min (ref >60)
Glucose, Bld: 101 mg/dL — ABNORMAL HIGH (ref 70–99)
Potassium: 4.3 mmol/L (ref 3.5–5.1)
Sodium: 141 mmol/L (ref 135–145)

## 2022-04-21 MED ORDER — LORAZEPAM 2 MG/ML IJ SOLN
1.0000 mg | Freq: Once | INTRAMUSCULAR | Status: AC
  Administered 2022-04-22: 1 mg via INTRAMUSCULAR
  Filled 2022-04-21: qty 1

## 2022-04-21 NOTE — ED Triage Notes (Signed)
Patient presents with multiple complaints: Left posterior /upper thigh pain onset 2 days ago ; chronic intermittent dizziness for 10 months and emesis last Thursday , seen at [facility] last 04/17/22.

## 2022-04-21 NOTE — ED Provider Triage Note (Signed)
Emergency Medicine Provider Triage Evaluation Note  [name] , a 61 y.o. female  was evaluated in triage.  Pt complains of ataxic gait.  Is been going on for months but worsened after recent new work trip.  Patient seen at [facility], MRI offered but patient declined at that time.  The gait has been significantly ataxic since then worsening, here for MRI brain..  Review of Systems  Per HPI  Physical Exam  BP (!) 147/80 (BP Location: Right Arm)   Pulse 95   Temp 99 F (37.2 C) (Oral)   Resp 16   SpO2 95%  Gen:   Awake, no distress   Resp:  Normal effort  MSK:   Moves extremities without difficulty  Other:  No nystagmus on my exam.  Cranial nerves II through XII are grossly intact.  There is no pronator drift, grip strength is equal bilaterally.  I do not appreciate any clonus.  Medical Decision Making  Medically screening exam initiated at 8:02 PM.  Appropriate orders placed.  [name] was informed that the remainder of the evaluation will be completed by another provider, this initial triage assessment does not replace that evaluation, and the importance of remaining in the ED until their evaluation is complete.  CT head negative, labs from [facility] were unremarkable a few days ago.  Neurology was consulted and advised MR brain without, will proceed with that imaging here today.    Sherrill Raring, PA-C 04/21/22 2002

## 2022-04-22 ENCOUNTER — Emergency Department (HOSPITAL_COMMUNITY): Payer: Medicare PPO

## 2022-04-22 DIAGNOSIS — I6782 Cerebral ischemia: Secondary | ICD-10-CM | POA: Diagnosis not present

## 2022-04-22 DIAGNOSIS — M4802 Spinal stenosis, cervical region: Secondary | ICD-10-CM | POA: Diagnosis not present

## 2022-04-22 DIAGNOSIS — M5412 Radiculopathy, cervical region: Secondary | ICD-10-CM | POA: Diagnosis not present

## 2022-04-22 DIAGNOSIS — M5116 Intervertebral disc disorders with radiculopathy, lumbar region: Secondary | ICD-10-CM | POA: Diagnosis not present

## 2022-04-22 DIAGNOSIS — M4317 Spondylolisthesis, lumbosacral region: Secondary | ICD-10-CM | POA: Diagnosis not present

## 2022-04-22 MED ORDER — DIPHENHYDRAMINE HCL 50 MG/ML IJ SOLN
25.0000 mg | Freq: Once | INTRAMUSCULAR | Status: AC
  Administered 2022-04-22: 25 mg via INTRAVENOUS
  Filled 2022-04-22: qty 1

## 2022-04-22 MED ORDER — GADOBUTROL 1 MMOL/ML IV SOLN
6.0000 mL | Freq: Once | INTRAVENOUS | Status: AC | PRN
  Administered 2022-04-22: 6 mL via INTRAVENOUS

## 2022-04-22 MED ORDER — MECLIZINE HCL 25 MG PO TABS: 25.0000 mg | ORAL_TABLET | Freq: Three times a day (TID) | ORAL | 0 refills | Status: AC | PRN

## 2022-04-22 MED ORDER — PROCHLORPERAZINE EDISYLATE 10 MG/2ML IJ SOLN
10.0000 mg | Freq: Once | INTRAMUSCULAR | Status: AC
  Administered 2022-04-22: 10 mg via INTRAVENOUS
  Filled 2022-04-22: qty 2

## 2022-04-22 MED ORDER — ACETAMINOPHEN 500 MG PO TABS
1000.0000 mg | ORAL_TABLET | Freq: Once | ORAL | Status: AC
  Administered 2022-04-22: 1000 mg via ORAL
  Filled 2022-04-22: qty 2

## 2022-04-22 MED ORDER — OXYCODONE HCL 5 MG PO TABS
5.0000 mg | ORAL_TABLET | Freq: Once | ORAL | Status: AC
  Administered 2022-04-22: 5 mg via ORAL
  Filled 2022-04-22: qty 1

## 2022-04-22 NOTE — ED Provider Notes (Signed)
[facility] EMERGENCY DEPARTMENT Provider Note   [name]: [id number] Arrival date & time: 04/21/22  1938     History  Chief Complaint  Patient presents with   Leg Pain /Dizzy/Emesis    [name] is a 61 y.o. female.  61 yo F with a chief complaint left leg pain.  The patient had been seen in the emergency department recently after developing some nausea vomiting and dizziness.  She still feels quite dizzy.  At that tim the plan was to try and obtain an MRI of the brain however the patient declined and elected to try and follow-up as an outpatient.  Since then the patient has been able to get around without still feels a bit dizzy.  Like the world is moving.  She also has developed some left lateral thigh pain.  She has fallen a couple times with this dizziness.  Denies any specific injury to the leg.  Feels like that leg just is not working right.  She has been having trouble ambulating for at least 5 or 6 months.  Denies any back pain.        Home Medications Prior to Admission medications   Medication Sig Start Date End Date Taking? Authorizing Provider  meclizine (ANTIVERT) 25 MG tablet Take 1 tablet (25 mg total) by mouth 3 (three) times daily as needed for dizziness. 04/22/22  Yes Deno Etienne, DO  acetaminophen (TYLENOL) 325 MG tablet Take 650 mg by mouth every 6 (six) hours as needed for mild pain, fever or headache.    [provider]  albuterol (VENTOLIN HFA) 108 (90 Base) MCG/ACT inhaler Inhale 2 puffs into the lungs every 6 (six) hours as needed for wheezing or shortness of breath. 08/14/20 08/14/21  [name], [name], MD  aspirin-acetaminophen-caffeine (EXCEDRIN MIGRAINE) [phone] MG tablet Take 2 tablets by mouth every 6 (six) hours as needed for headache.    [provider]  Blood Glucose Monitoring Suppl (ONE TOUCH ULTRA 2) w/Device KIT Use to test blood sugars 1-2 times daily. 10/07/21   [name], [name], MD  buPROPion (WELLBUTRIN XL)  150 MG 24 hr tablet Take 450 mg by mouth every morning. 11/03/19   [provider]  calcium carbonate (TUMS) 500 MG chewable tablet Chew 2 tablets (400 mg of elemental calcium total) by mouth 3 (three) times daily. 05/24/21   Armandina Gemma, MD  divalproex (DEPAKOTE ER) 250 MG 24 hr tablet Take 750 mg by mouth at bedtime. 11/20/20   [provider]  glucose blood (ONETOUCH ULTRA) test strip Use to test blood sugars 1-2 times daily. 10/07/21   [name], [name], MD  Lancets [facility] ULTRASOFT) lancets Use to test blood sugars 1-2 times daily. 10/07/21   [name], [name], MD  levothyroxine (SYNTHROID) 100 MCG tablet Take 1 tablet (100 mcg total) by mouth daily. 10/10/21   Shamleffer, Melanie Crazier, MD  LORazepam (ATIVAN) 0.5 MG tablet Take 0.5-1 mg by mouth daily as needed for anxiety.    [provider]  metFORMIN (GLUCOPHAGE) 500 MG tablet Take 1 tablet (500 mg total) by mouth 2 (two) times daily with a meal. 10/10/21   Shamleffer, Melanie Crazier, MD  Multiple Vitamin (MULTIVITAMIN WITH MINERALS) TABS tablet Take 1 tablet by mouth daily.    [provider]  pravastatin (PRAVACHOL) 20 MG tablet Take 1 tablet by mouth once daily 02/03/22   [name], [name], MD      Allergies    Morphine and related, Ambien [zolpidem],  Duloxetine, Latuda [lurasidone hcl], Morphine, and Shingrix [zoster vac recomb adjuvanted]    Review of Systems   Review of Systems  Physical Exam Updated Vital Signs BP 120/72 (BP Location: Right Arm)   Pulse 74   Temp 98.3 F (36.8 C) (Oral)   Resp 15   SpO2 100%  Physical Exam Vitals and nursing note reviewed.  Constitutional:      General: She is not in acute distress.    Appearance: She is well-developed. She is not diaphoretic.  HENT:     Head: Normocephalic and atraumatic.  Eyes:     Pupils: Pupils are equal, round, and reactive to light.  Cardiovascular:     Rate and Rhythm: Normal rate and regular rhythm.     Heart sounds: No murmur  heard.    No friction rub. No gallop.  Pulmonary:     Effort: Pulmonary effort is normal.     Breath sounds: No wheezing or rales.  Abdominal:     General: There is no distension.     Palpations: Abdomen is soft.     Tenderness: There is no abdominal tenderness.  Musculoskeletal:        General: No tenderness.     Cervical back: Normal range of motion and neck supple.     Comments: Hyperreflexia with the left lower extremity compared to right.  No appreciable clonus.  Unable to plantarflex fully  Skin:    General: Skin is warm and dry.  Neurological:     Mental Status: She is alert and oriented to person, place, and time.     GCS: GCS eye subscore is 4. GCS verbal subscore is 5. GCS motor subscore is 6.     Cranial Nerves: Cranial nerves 2-12 are intact.     Sensory: Sensation is intact.     Comments: Mild weakness to the left lower extremity compared to right.  No appreciable ataxia on finger-nose or heel-to-shin.  Wide-based gait with ambulation  Psychiatric:        Behavior: Behavior normal.     ED Results / Procedures / Treatments   Labs (all labs ordered are listed, but only abnormal results are displayed) Labs Reviewed  BASIC METABOLIC PANEL - Abnormal; Notable for the following components:      Result Value   CO2 21 (*)    Glucose, Bld 101 (*)    All other components within normal limits  CBC WITH DIFFERENTIAL/PLATELET    EKG None  Radiology MR Cervical Spine W or Wo Contrast  Result Date: 04/22/2022 CLINICAL DATA:  Cervical radiculopathy, infection suspected. EXAM: MRI TOTAL SPINE WITHOUT AND WITH CONTRAST TECHNIQUE: Multisequence MR imaging of the spine from the cervical spine to the sacrum was performed prior to and following IV contrast administration. Ataxic gait. CONTRAST:  43m GADAVIST GADOBUTROL 1 MMOL/ML IV SOLN COMPARISON:  Cervical spine CT 07/18/2020 FINDINGS: MRI CERVICAL SPINE FINDINGS Alignment: Generalized straightening Vertebrae: No fracture,  evidence of discitis, or bone lesion. Cord: Normal signal and morphology. Posterior Fossa, vertebral arteries, paraspinal tissues: Negative. Disc levels: Diffusely preserved disc height and hydration. No significant facet spurring. No neural impingement. MRI THORACIC SPINE FINDINGS Alignment:  Unremarkable Vertebrae: No fracture, evidence of discitis, or bone lesion. Cord:  Normal signal and morphology. Paraspinal and other soft tissues: Negative. Disc levels: Diffusely preserved disc height and hydration. No significant facet spurring and no neural impingement MRI LUMBAR SPINE FINDINGS Segmentation:  5 lumbar type vertebrae Alignment:  Grade 1 anterolisthesis at L5-S1. Vertebrae: No  fracture, evidence of discitis, or bone lesion. Mild heterogeneity of marrow. Conus medullaris: Extends to the T12-L1 level and appears normal. Paraspinal and other soft tissues: Negative for perispinal mass or inflammation Disc levels: L4-5 degenerative facet spurring asymmetric to the left. Disc space narrowing and bulging. No neural impingement. L5-S1 degenerative facet spurring asymmetric to the right. Disc bulging with central protrusion. Mild thecal sac narrowing. IMPRESSION: 1. No explanation for symptoms.  Normal appearance of the cord. 2. Less than typical degeneration seen in the lower lumbar spine. No neural impingement throughout the spine. Electronically Signed   By: Jorje Guild M.D.   On: 04/22/2022 06:50   MR THORACIC SPINE W WO CONTRAST  Result Date: 04/22/2022 CLINICAL DATA:  Cervical radiculopathy, infection suspected. EXAM: MRI TOTAL SPINE WITHOUT AND WITH CONTRAST TECHNIQUE: Multisequence MR imaging of the spine from the cervical spine to the sacrum was performed prior to and following IV contrast administration. Ataxic gait. CONTRAST:  74m GADAVIST GADOBUTROL 1 MMOL/ML IV SOLN COMPARISON:  Cervical spine CT 07/18/2020 FINDINGS: MRI CERVICAL SPINE FINDINGS Alignment: Generalized straightening Vertebrae: No  fracture, evidence of discitis, or bone lesion. Cord: Normal signal and morphology. Posterior Fossa, vertebral arteries, paraspinal tissues: Negative. Disc levels: Diffusely preserved disc height and hydration. No significant facet spurring. No neural impingement. MRI THORACIC SPINE FINDINGS Alignment:  Unremarkable Vertebrae: No fracture, evidence of discitis, or bone lesion. Cord:  Normal signal and morphology. Paraspinal and other soft tissues: Negative. Disc levels: Diffusely preserved disc height and hydration. No significant facet spurring and no neural impingement MRI LUMBAR SPINE FINDINGS Segmentation:  5 lumbar type vertebrae Alignment:  Grade 1 anterolisthesis at L5-S1. Vertebrae: No fracture, evidence of discitis, or bone lesion. Mild heterogeneity of marrow. Conus medullaris: Extends to the T12-L1 level and appears normal. Paraspinal and other soft tissues: Negative for perispinal mass or inflammation Disc levels: L4-5 degenerative facet spurring asymmetric to the left. Disc space narrowing and bulging. No neural impingement. L5-S1 degenerative facet spurring asymmetric to the right. Disc bulging with central protrusion. Mild thecal sac narrowing. IMPRESSION: 1. No explanation for symptoms.  Normal appearance of the cord. 2. Less than typical degeneration seen in the lower lumbar spine. No neural impingement throughout the spine. Electronically Signed   By: JJorje GuildM.D.   On: 04/22/2022 06:50   MR Lumbar Spine W Wo Contrast  Result Date: 04/22/2022 CLINICAL DATA:  Cervical radiculopathy, infection suspected. EXAM: MRI TOTAL SPINE WITHOUT AND WITH CONTRAST TECHNIQUE: Multisequence MR imaging of the spine from the cervical spine to the sacrum was performed prior to and following IV contrast administration. Ataxic gait. CONTRAST:  665mGADAVIST GADOBUTROL 1 MMOL/ML IV SOLN COMPARISON:  Cervical spine CT 07/18/2020 FINDINGS: MRI CERVICAL SPINE FINDINGS Alignment: Generalized straightening  Vertebrae: No fracture, evidence of discitis, or bone lesion. Cord: Normal signal and morphology. Posterior Fossa, vertebral arteries, paraspinal tissues: Negative. Disc levels: Diffusely preserved disc height and hydration. No significant facet spurring. No neural impingement. MRI THORACIC SPINE FINDINGS Alignment:  Unremarkable Vertebrae: No fracture, evidence of discitis, or bone lesion. Cord:  Normal signal and morphology. Paraspinal and other soft tissues: Negative. Disc levels: Diffusely preserved disc height and hydration. No significant facet spurring and no neural impingement MRI LUMBAR SPINE FINDINGS Segmentation:  5 lumbar type vertebrae Alignment:  Grade 1 anterolisthesis at L5-S1. Vertebrae: No fracture, evidence of discitis, or bone lesion. Mild heterogeneity of marrow. Conus medullaris: Extends to the T12-L1 level and appears normal. Paraspinal and other soft tissues: Negative for perispinal mass  or inflammation Disc levels: L4-5 degenerative facet spurring asymmetric to the left. Disc space narrowing and bulging. No neural impingement. L5-S1 degenerative facet spurring asymmetric to the right. Disc bulging with central protrusion. Mild thecal sac narrowing. IMPRESSION: 1. No explanation for symptoms.  Normal appearance of the cord. 2. Less than typical degeneration seen in the lower lumbar spine. No neural impingement throughout the spine. Electronically Signed   By: Jorje Guild M.D.   On: 04/22/2022 06:50   MR BRAIN WO CONTRAST  Result Date: 04/22/2022 CLINICAL DATA:  Dizziness, persistent or recurrent with cardiac or vascular cause suspected. EXAM: MRI HEAD WITHOUT CONTRAST TECHNIQUE: Multiplanar, multiecho pulse sequences of the brain and surrounding structures were obtained without intravenous contrast. COMPARISON:  Head CT 04/17/2022.  Brain MRI 11/07/2020 FINDINGS: Brain: No acute infarction, hemorrhage, hydrocephalus, extra-axial collection or mass lesion. Generalized volume loss with  ventriculomegaly greater than expected for age and mildly progressed from before. No disproportionate subarachnoid spaces or specific features of communicating hydrocephalus. Mild FLAIR hyperintensity in the cerebral white matter. Vascular: Major flow voids are preserved Skull and upper cervical spine: Normal marrow signal. Sinuses/Orbits: Negative IMPRESSION: 1. No acute finding or specific cause for symptoms. 2. Generalized volume loss.  Mild chronic small vessel ischemia. Electronically Signed   By: Jorje Guild M.D.   On: 04/22/2022 06:44    Procedures Procedures    Medications Ordered in ED Medications  LORazepam (ATIVAN) injection 1 mg (1 mg Intramuscular Given 04/22/22 0450)  prochlorperazine (COMPAZINE) injection 10 mg (10 mg Intravenous Given 04/22/22 0255)  diphenhydrAMINE (BENADRYL) injection 25 mg (25 mg Intravenous Given 04/22/22 0256)  acetaminophen (TYLENOL) tablet 1,000 mg (1,000 mg Oral Given 04/22/22 0256)  oxyCODONE (Oxy IR/ROXICODONE) immediate release tablet 5 mg (5 mg Oral Given 04/22/22 0256)  gadobutrol (GADAVIST) 1 MMOL/ML injection 6 mL (6 mLs Intravenous Contrast Given 04/22/22 0606)    ED Course/ Medical Decision Making/ A&P                           Medical Decision Making Amount and/or Complexity of Data Reviewed Radiology: ordered.  Risk OTC drugs. Prescription drug management.   63 yoF with a chief complaints of unsteadiness on her feet dizziness and left leg pain.  This has been going on for some time now.  She actually was seen in the ER in a shared visit with myself a few days ago.  At that time I was concerned for possible spinal pathology she had had new clonus and hyperreflexia least not denied seen documented previously.  We have discussed the case with neurology recommended MRI of the brain as she was dizzy to rule out a posterior circulation stroke.  I feel the patient would benefit from spinal imaging as well with possible new spinal findings on exam.  She  has no appreciable clonus today but still is quite hyperreflexic and now feels like that leg is now not quite working right.  There is weakness to that side as well.  MRI luckily without intercranial or intraspinal pathology.  Discussed with patient.  Meclizine, PCP and neuro follow up.   7:01 AM:  I have discussed the diagnosis/risks/treatment options with the patient.  Evaluation and diagnostic testing in the emergency department does not suggest an emergent condition requiring admission or immediate intervention beyond what has been performed at this time.  They will follow up with PCP, neuro. We also discussed returning to the ED immediately if new or  worsening sx occur. We discussed the sx which are most concerning (e.g., sudden worsening pain, fever, inability to tolerate by mouth, cauda equina s/sx) that necessitate immediate return. Medications administered to the patient during their visit and any new prescriptions provided to the patient are listed below.  Medications given during this visit Medications  LORazepam (ATIVAN) injection 1 mg (1 mg Intramuscular Given 04/22/22 0450)  prochlorperazine (COMPAZINE) injection 10 mg (10 mg Intravenous Given 04/22/22 0255)  diphenhydrAMINE (BENADRYL) injection 25 mg (25 mg Intravenous Given 04/22/22 0256)  acetaminophen (TYLENOL) tablet 1,000 mg (1,000 mg Oral Given 04/22/22 0256)  oxyCODONE (Oxy IR/ROXICODONE) immediate release tablet 5 mg (5 mg Oral Given 04/22/22 0256)  gadobutrol (GADAVIST) 1 MMOL/ML injection 6 mL (6 mLs Intravenous Contrast Given 04/22/22 0606)     The patient appears reasonably screen and/or stabilized for discharge and I doubt any other medical condition or other [facility] requiring further screening, evaluation, or treatment in the ED at this time prior to discharge.          Final Clinical Impression(s) / ED Diagnoses Final diagnoses:  Dizziness and giddiness  Gait instability    Rx / DC Orders ED Discharge Orders           Ordered    meclizine (ANTIVERT) 25 MG tablet  3 times daily PRN        04/22/22 0656              Deno Etienne, DO 04/22/22 0701

## 2022-04-22 NOTE — ED Notes (Signed)
Patient verbalizes understanding of discharge instructions. Opportunity for questioning and answers were provided. Armband removed by staff, pt discharged from ED via wheelchair to lobby to return home with family.  

## 2022-04-22 NOTE — Discharge Instructions (Signed)
Follow up with your PCP and your neurologist.  Return for worsening symptoms.

## 2022-04-23 ENCOUNTER — Encounter: Payer: Self-pay | Admitting: Neurology

## 2022-04-23 ENCOUNTER — Ambulatory Visit: Payer: Medicare PPO | Admitting: Neurology

## 2022-04-23 VITALS — BP 121/80 | HR 82 | Ht 60.0 in | Wt 153.0 lb

## 2022-04-23 DIAGNOSIS — R296 Repeated falls: Secondary | ICD-10-CM

## 2022-04-23 DIAGNOSIS — H8110 Benign paroxysmal vertigo, unspecified ear: Secondary | ICD-10-CM

## 2022-04-23 DIAGNOSIS — G3184 Mild cognitive impairment, so stated: Secondary | ICD-10-CM

## 2022-04-23 MED ORDER — ONDANSETRON HCL 4 MG PO TABS: 4.0000 mg | ORAL_TABLET | Freq: Three times a day (TID) | ORAL | 0 refills | Status: DC | PRN

## 2022-04-23 NOTE — Progress Notes (Signed)
Follow-up Visit   Date: 04/23/22   [name] MRN: [id number] DOB: 20-Aug-1960   Interim History: [name] is a 61 y.o. Caucasian female with bipolar depression, anxiety, GERD, hyperlipidemia, and IBS returning to the clinic for follow-up of dizziness.  The patient was accompanied to the clinic by partner who also provides collateral information.    History of present illness: Starting around 2015, she began having weakness of the upper legs which has been intermittent and became worse over the past year. Specifically, she has difficulty climbing stairs and walking for extended periods.  Sometimes, her partner states that she will walk even faster because she feels unsteady.   She does not use a cane.   She has suffered 10 falls over the past year, she usually needs help to stand up.  She has not had any ER visit due to injuries. She does not have numbness/tingling of the legs, low back pain, or pain in the legs.  She completed physical therapy about 6-9 months ago, but she does not appreciate much improvement.  Upon further discussion, her partner states that she had a psychotic episode about 5 years ago and everything has been worse since then. She feels scared and afraid to walk. She sees psychiatry and a Social worker.  She is on disability for bipolar disease since this time.     In the past, she has been evaluated neurologists at [facility] and [facility] for various neurological symptoms, including weakness.  Imaging excluded possibility for multiple sclerosis and NCS/EMG with repetitive nerve stimulation excluded neuromuscular junction disorder.      UPDATE 10/15/2020:  Over the past 3 months, she has been falling more frequently.  She says that she looses her balance and falls without always being able to break her falls. She is not sure what causes her to fall.  She cannot tell whether her legs buckle, she is in pain, gets lightheaded, or trips.  She also complains of memory loss  and being forgetful frequently.  She manages her own medication, but admits to sometimes taking it twice. She does not use a pillbox, despite this being encouraged by her partner.   UPDATE 04/23/2022:  She is here for acute visit.  Her balance has been getting worse.  She has fallen 3 times this year.  She is able to stand up without assistance.  She tends to fall forwards, especially if leaning forward.  She typically will hold onto her partner for support.    She ER to the on 8/31 with acute onset of spinning dizziness and nausea/vomiting. She continues to have spells of spinning, which prompted another ER visit on 9/5. She had MRI neuroaxis which was normal.  Meclizine was prescribed, they have yet to pick it up. She has been having spells of dizziness all summer.  No preceding illness. During the end of August, they had a planned visit to [place] to celebrate their birthdays and the entire time, she would need to use a walker/wheelchair because of imbalance.    Partner also states that her behavior has changed.  She is agitated and tired.  Her memory is getting worse,especially short-term.  She forgets details of conversations, tasks, etc.  She is not driving due to concern of getting lost.  Neuropsychological testing from February 2022 shows MCI.  Her psychiatrist, Dr. Casimiro Needle, has requested that tested be repeated.    Medications:  Current Outpatient Medications on File Prior to Visit  Medication Sig Dispense Refill  acetaminophen (TYLENOL) 325 MG tablet Take 650 mg by mouth every 6 (six) hours as needed for mild pain, fever or headache.     albuterol (VENTOLIN HFA) 108 (90 Base) MCG/ACT inhaler Inhale 2 puffs into the lungs every 6 (six) hours as needed for wheezing or shortness of breath. 18 g 1   aspirin-acetaminophen-caffeine (EXCEDRIN MIGRAINE) 937-902-40 MG tablet Take 2 tablets by mouth every 6 (six) hours as needed for headache.     Blood Glucose Monitoring Suppl (ONE TOUCH ULTRA 2) w/Device  KIT Use to test blood sugars 1-2 times daily. 1 kit 0   buPROPion (WELLBUTRIN XL) 150 MG 24 hr tablet Take 450 mg by mouth every morning.     calcium carbonate (TUMS) 500 MG chewable tablet Chew 2 tablets (400 mg of elemental calcium total) by mouth 3 (three) times daily. 90 tablet 1   divalproex (DEPAKOTE ER) 250 MG 24 hr tablet Take 750 mg by mouth at bedtime.     glucose blood (ONETOUCH ULTRA) test strip Use to test blood sugars 1-2 times daily. 200 each 3   Lancets (ONETOUCH ULTRASOFT) lancets Use to test blood sugars 1-2 times daily. 200 each 3   levothyroxine (SYNTHROID) 100 MCG tablet Take 1 tablet (100 mcg total) by mouth daily. 90 tablet 3   LORazepam (ATIVAN) 0.5 MG tablet Take 0.5-1 mg by mouth daily as needed for anxiety.     meclizine (ANTIVERT) 25 MG tablet Take 1 tablet (25 mg total) by mouth 3 (three) times daily as needed for dizziness. 30 tablet 0   metFORMIN (GLUCOPHAGE) 500 MG tablet Take 1 tablet (500 mg total) by mouth 2 (two) times daily with a meal. 180 tablet 2   Multiple Vitamin (MULTIVITAMIN WITH MINERALS) TABS tablet Take 1 tablet by mouth daily.     pravastatin (PRAVACHOL) 20 MG tablet Take 1 tablet by mouth once daily 90 tablet 2   No current facility-administered medications on file prior to visit.    Allergies:  Allergies  Allergen Reactions   Morphine And Related Nausea And Vomiting and Nausea Only   Ambien [Zolpidem] Other (See Comments)    Per patient this caused her to fall   Duloxetine Swelling and Other (See Comments)    Other reaction(s): SWELLING/EDEMA   Latuda [Lurasidone Hcl]    Morphine Nausea Only   Shingrix [Zoster Vac Recomb Adjuvanted]     Dizziness vomiting    Vital Signs:  BP 121/80   Pulse 82   Ht 5' (1.524 m)   Wt 153 lb (69.4 kg)   SpO2 95%   BMI 29.88 kg/m   Neurological Exam: MENTAL STATUS including orientation to time, place, person, recent and remote memory, attention span and concentration, language, and fund of  knowledge is fair.  Speech is not dysarthric.       04/23/2022   11:00 AM  Montreal Cognitive Assessment   Visuospatial/ Executive (0/5) 2  Naming (0/3) 3  Attention: Read list of digits (0/2) 2  Attention: Read list of letters (0/1) 1  Attention: Serial 7 subtraction starting at 100 (0/3) 3  Language: Repeat phrase (0/2) 2  Language : Fluency (0/1) 0  Abstraction (0/2) 2  Delayed Recall (0/5) 3  Orientation (0/6) 6  Total 24  Adjusted Score (based on education) 24    CRANIAL NERVES:  Pupils round and reactive bilaterally.  Extraocular muscles intact.  There is mild end-gaze nystagmus on the right.  No ptosis.  Face is symmetric. Tongue is midline.  MOTOR:  Motor strength is 5/5 in all extremities.  No atrophy, fasciculations or abnormal movements.  No pronator drift.  Tone is normal.    MSRs:  Reflexes are 3+/4 throughout, and 2+/4 at the ankles.  SENSORY:  Intact to vibration and temperature throughout.  Rhomberg testing is positive.   COORDINATION/GAIT:  Normal finger-to- nose-finger.  Intact rapid alternating movements bilaterally.  Gait wide-based, stable unassisted.   Data: MRI brain wo contrast 04/22/2022: 1. No acute finding or specific cause for symptoms. 2. Generalized volume loss.  Mild chronic small vessel ischemia.  MRI cervical, thoracic, and lumbar spine 04/22/2022: 1. No explanation for symptoms.  Normal appearance of the cord. 2. Less than typical degeneration seen in the lower lumbar spine. Noneural impingement throughout the spine.  MRI brain 09/23/2014:  Abnormal MRI scan of the brain showing tiny bilateral subcortical and periventricular nonspecific white matter hyperintensities with the differential discussed above. No enhancing lesions are noted.   MRI cervical and thoracic spine wwo contrast 06/11/2011:  Negative cervical and thoracic spine MRI.  No evidence of multiple sclerosis. Central canal and foramina widely patent all levels.   NCS/EMG of the right  arm and leg 09/21/2013:  Nerve conduction studies done on both upper extremities and on the right lower extremity were normal. No evidence of a peripheral neuropathy is seen. EMG evaluation was not done. Repetitive nerve stimulation on the right hand was done at 3 Hz stimulation, and again at 40 Hz stimulation without evidence of a neuromuscular transmission disorder. Repetitive nerve stimulation studies were completely normal.  IMPRESSION/PLAN: Benign paroxsymal peripheral vertigo.  MRI brain is normal.  Symptoms are most consistent with inner ear pathology causing vertigo. I will refer her for out-patient vestibular therapy.  She has a prescription for meclizine.  I have sent zofran as needed for nausea. If symptoms do not improve, recommend ENT evaluation. Mild cognitive impairment with progressive memory and behavior changes.  Formal neuropsychological testing from 2022 was consistent with MCI, contributed by mood disorder, polypharmacy, and poor sleep.  Her psychiatrist, Dr. Casimiro Needle, has requested repeat assessment.  Patient is aware that we are scheduling into mid-2024 for testing and agreeable to waiting. Falls, unclear etiology.  No evidence of weakness, neurodegenerative condition, or neuropathy on her exam.  Fall precautions discussed.  Return to clinic after testing  Thank you for allowing me to participate in patient's care.  If I can answer any additional questions, I would be pleased to do so.    Sincerely,    Essense Bousquet K. Posey Pronto, DO

## 2022-04-23 NOTE — Patient Instructions (Addendum)
We will refer you to vestibular therapy for vertigo  Neuropsychiatry testing will be ordered for memory changes  Follow-up after testing

## 2022-05-06 ENCOUNTER — Encounter: Payer: Self-pay | Admitting: Neurology

## 2022-05-07 ENCOUNTER — Ambulatory Visit: Payer: Medicare PPO | Attending: Neurology

## 2022-05-07 DIAGNOSIS — Z9181 History of falling: Secondary | ICD-10-CM | POA: Insufficient documentation

## 2022-05-07 DIAGNOSIS — G3184 Mild cognitive impairment, so stated: Secondary | ICD-10-CM | POA: Diagnosis not present

## 2022-05-07 DIAGNOSIS — R296 Repeated falls: Secondary | ICD-10-CM | POA: Diagnosis not present

## 2022-05-07 DIAGNOSIS — R42 Dizziness and giddiness: Secondary | ICD-10-CM | POA: Insufficient documentation

## 2022-05-07 DIAGNOSIS — H8110 Benign paroxysmal vertigo, unspecified ear: Secondary | ICD-10-CM | POA: Diagnosis not present

## 2022-05-07 DIAGNOSIS — M6281 Muscle weakness (generalized): Secondary | ICD-10-CM | POA: Diagnosis not present

## 2022-05-07 NOTE — Therapy (Signed)
OUTPATIENT PHYSICAL THERAPY VESTIBULAR EVALUATION     Patient Name: [name] MRN: [id number] DOB:[birth year], 61 y.o., female Today's Date: 05/07/2022  PCP: Betty [name], MD REFERRING PROVIDER: Alda Berthold, DO    PT End of Session - 05/07/22 [phone]     Visit Number 1    Number of Visits 13    Date for PT Re-Evaluation 06/25/22   to allow for scheduling delay   Authorization Type [employer]    Progress Note Due on Visit 10    PT Start Time 0810   patient late   PT Stop Time 0845    PT Time Calculation (min) 35 min    Activity Tolerance Patient tolerated treatment well    Behavior During Therapy [facility] for tasks assessed/performed             Past Medical History:  Diagnosis Date   Allergic rhinitis 03/26/2007   Anginal pain ([place]) 2001   Autonomic dysfunction 01/11/2015   Bipolar 1 disorder    Hx of psychosis; present since age 83   Chronic fatigue 06/21/2017   Community acquired pneumonia of right lung 41/32/4401   Complication of anesthesia    agitation   Costochondritis 02/14/2014   Diverticular disease of colon 08/06/2020   Double vision 03/15/2011   Dyspnea on effort 01/10/2011   Dysuria 02/15/2013   Elevated lactic acid level    Endometriosis 05/11/2008   Fatty liver disease, nonalcoholic 02/72/5366   Frequency of urination    Gastroesophageal reflux disease 08/06/2020   Headache    History of duodenal ulcer    History of kidney stones    History of lithium toxicity 05/01/2014   Hyperglycemia 01/10/2011   Hyperlipidemia, mixed 05/12/2017   Hyperprolactinemia 06/26/2015   Insomnia 05/12/2017   Irritable bowel syndrome 08/06/2020   Jaw pain 02/13/2012   poss tmj area     Leukocytosis 05/01/2014   Multinodular goiter 05/12/2017   Nocturia    Nondiabetic gastroparesis 01/11/2015   Pelvic pain    Plantar fasciitis 06/11/2007   Pneumonia 08/20/2020   Pruritus ani 08/06/2020   Schizoaffective disorder, bipolar type    Unclear if this is  not better accounted for by longstanding history of bipolar disorder   Social anxiety disorder    Subacute bronchitis 07/10/2018   Thyroid nodule 02/02/2017   Urgency of urination    Vitamin D deficiency 09/23/2011   Past Surgical History:  Procedure Laterality Date   CARDIAC CATHETERIZATION  10-09-1999  DR KATZ   NORMAL LVF/  NORMAL RCA/  NO CRITICAL DISEASE LEFT CORONARY SYSTEM   CARDIOVASCULAR STRESS TEST  01-23-2011   NORMAL NUCLEAR STUDY/ NO ISCHEMIA/ EF 81%   CYSTOSCOPY WITH BIOPSY N/A 06/03/2013   Procedure: CYSTOSCOPY WITH BIOPSY  INSTILLATION OF MARCAINE AND PYRIDIUM;  Surgeon: Hanley Ben, MD;  Location: [facility];  Service: Urology;  Laterality: N/A;   LAPAROSCOPIC CHOLECYSTECTOMY  11-08-2001   LAPAROSCOPIC LEFT SALPINGOOPHORECTOMY AND LYSYS ADHESIONS  03-10-2002   LAPAROSCOPIC REMOVAL OVARY REMNANT  2003   [place]   LAPAROSCOPY N/A 12/14/2012   Procedure: LAPAROSCOPY DIAGNOSTIC;  Surgeon: Stark Klein, MD;  Location: [facility];  Service: General;  Laterality: N/A;   THYROIDECTOMY N/A 05/24/2021   Procedure: TOTAL THYROIDECTOMY;  Surgeon: Armandina Gemma, MD;  Location: [facility];  Service: General;  Laterality: N/A;   TOTAL ABDOMINAL HYSTERECTOMY  2000   W/ RIGHT SALPINGOOPHORECTOMY   TRANSTHORACIC ECHOCARDIOGRAM  10-13-2010   NORMAL LVF/  EF 55-60%   Patient  Active Problem List   Diagnosis Date Noted   Noninvasive follicular neoplasm of thyroid with papillary-like nuclear features 10/10/2021   Diabetes mellitus type II, controlled ([place]) 10/10/2021   Type 2 diabetes mellitus with other specified complication ([place]) 82/99/3716   Postsurgical hypothyroidism 09/03/2021   B12 deficiency 09/03/2021   Neoplasm of uncertain behavior of thyroid gland 05/19/2021   Social anxiety disorder    Elevated lactic acid level    Community acquired pneumonia of right lung 08/17/2020   Diverticular disease of colon 08/06/2020   Fecal urgency 08/06/2020   Flatulence,  eructation and gas pain 08/06/2020   Gastroesophageal reflux disease 08/06/2020   Irritable bowel syndrome 08/06/2020   Left upper quadrant pain 96/78/9381   Periumbilical pain 01/75/1025   Pruritus ani 08/06/2020   Rectal bleeding 08/06/2020   Chronic fatigue 06/21/2017   Hyperlipidemia, mixed 05/12/2017   Insomnia 05/12/2017   Multinodular goiter 05/12/2017   Thyroid nodule 02/02/2017   Hyperprolactinemia 06/26/2015   Schizoaffective disorder, bipolar type 06/21/2015   Autonomic dysfunction 01/11/2015   Bipolar 1 disorder 01/11/2015   Nondiabetic gastroparesis 01/11/2015   History of lithium toxicity 05/01/2014   Leukocytosis 05/01/2014   Costochondritis 02/14/2014   Fatty liver disease, nonalcoholic 85/27/7824   Early satiety 04/27/2013   Abnormal LFTs 04/06/2013   Dysuria 02/15/2013   Lump of breast, left 12/20/2012   Loss of weight 11/08/2012   Food aversion 11/08/2012   Constipation 05/17/2012   Right ear pain 02/16/2012   Jaw pain 02/13/2012   Vitamin D deficiency 09/23/2011   Loose stools 09/23/2011   Double vision 03/15/2011   Hyperglycemia 01/10/2011   Dyspnea on effort 01/10/2011   Palpitations 12/08/2010   Endometriosis 05/11/2008   Plantar fasciitis 06/11/2007   Allergic rhinitis 03/26/2007    ONSET DATE: 04/21/22  REFERRING DIAG: H81.10 (ICD-10-CM) - Benign paroxysmal positional vertigo, unspecified laterality G31.84 (ICD-10-CM) - MCI (mild cognitive impairment) R29.6 (ICD-10-CM) - Falls frequently   THERAPY DIAG:  Dizziness and giddiness  Muscle weakness (generalized)  Personal history of fall  Rationale for Evaluation and Treatment Rehabilitation  SUBJECTIVE:   SUBJECTIVE STATEMENT: Patient reports doing fair- present with wife. Reports having episodes of room spinning with N/V (on rx for this). Has happened ~1x in the past week. Happens more often with head movement to the R and if she's moving around a lot.  Pt accompanied by: significant  other  PERTINENT HISTORY: bipolar depression, anxiety, GERD, hyperlipidemia, and IBS    PAIN:  Are you having pain? No  PRECAUTIONS: Fall  WEIGHT BEARING RESTRICTIONS No  FALLS: Has patient fallen in last 6 months? Yes. Number of falls 2-3 seemingly always forward, but does make attempts to catch herself.   LIVING ENVIRONMENT: Lives with: lives with their spouse Lives in: House/apartment Stairs: Yes: Internal: flight steps; on right going up Has following equipment at home: Single point cane, Environmental consultant - 2 wheeled, Wheelchair (manual), Grab bars, and Ramped entry  PLOF: Independent with basic ADLs  PATIENT GOALS "to get rid of the dizziness"   OBJECTIVE:    COGNITION: Overall cognitive status: History of cognitive impairments - at baseline   SENSATION: WFL  POSTURE: No Significant postural limitations   Cervical ROM:    Active A/PROM (deg) eval  Flexion   Extension   Right lateral flexion   Left lateral flexion   Right rotation   Left rotation   (Blank rows = not tested) *WFL   LOWER EXTREMITY MMT:  Grossly 4/5; proximal 3/5  BED  MOBILITY:  Requires intermittent assist due to B LE weakness and dizziness  TRANSFERS: Assistive device utilized: None  Sit to stand: SBA Stand to sit: SBA Chair to chair: SBA  GAIT: Gait pattern: step through pattern, decreased arm swing- Right, decreased arm swing- Left, decreased stride length, Right foot flat, Left foot flat, trendelenburg, trunk flexed, and wide BOS Distance walked: clinic Assistive device utilized: None Level of assistance: SBA  PATIENT SURVEYS:  FOTO 39   VESTIBULAR ASSESSMENT    SYMPTOM BEHAVIOR:   Subjective history: see above   Non-Vestibular symptoms: nausea/vomiting and migraine symptoms   Type of dizziness: Spinning/Vertigo and "World moves"   Frequency: ~1x in the past week, but would happen more often prior to that   Duration: "can last all day"   Aggravating factors: Induced by  motion: turning head quickly   Relieving factors: lying supine, dark room, and closing eyes   Progression of symptoms: worse   OCULOMOTOR EXAM:   Ocular Alignment: normal   Ocular ROM: No Limitations   Spontaneous Nystagmus: absent   Gaze-Induced Nystagmus:  absent; but increase in dizziness report with eye movement R   Smooth Pursuits: intact   Saccades: intact   Convergence/Divergence: <6 cm     VESTIBULAR - OCULAR REFLEX:    Slow VOR: Positive Right   Head-Impulse Test: HIT Right: positive HIT Left: negative   Dynamic Visual Acuity:  to be assessed    POSITIONAL TESTING: Right Dix-Hallpike: no nystagmus Left Dix-Hallpike: no nystagmus    MOTION SENSITIVITY:    Motion Sensitivity Quotient To be assessed   VESTIBULAR TREATMENT:  N/A eval  PATIENT EDUCATION: Education details: PT POC, testing results, BPPV vs hypofunction Person educated: Patient and Spouse Education method: Explanation Education comprehension: verbalized understanding and needs further education   GOALS: Goals reviewed with patient? Yes  SHORT TERM GOALS: Target date: 05/28/2022   Pt will be independent with initial HEP for improved balance  Baseline: to be provided Goal status: INITIAL  2.  MSQ to be assessed and goal set as appropriate Baseline: to be assessed Goal status: INITIAL  3.  FGA to be assessed and goal set as appropriate Baseline: to be assessed Goal status: INITIAL  4.  Patient will demonstrate (-) positional testing to indicate resolution of BPPV  Baseline: to be re-assessed Goal status: INITIAL  5.  DVA to be assessed and goal set as appropriate Baseline: to be assessed Goal status: INITIAL   LONG TERM GOALS: Target date: 06/18/2022    Pt will be independent with final HEP for improved Baseline: to be provided Goal status: INITIAL  2.  FGA goal Baseline: to be assessed Goal status: INITIAL  3.  Patient will improve FOTO score to >/= 56  Baseline: 39 Goal  status: INITIAL  4.  DVA goal Baseline: to be assessed Goal status: INITIAL  5.  FGA goal Baseline: to be assessed Goal status: INITIAL  6.  MSQ goal  Baseline: to be assessed Goal status: INITIAL  ASSESSMENT:  CLINICAL IMPRESSION: Patient is a 61 y.o. female who was seen today for physical therapy evaluation and treatment for balance impairment. Patient with positive R HIT indicating R hypofunction. Dix hallpike (-) bilaterally, but patient did report mild dizziness. Patient with increased risk for falling due to this hypofunction as well as inadequate B LE strength to assist with regaining her balance. She would benefit from skilled PT services to address the above mentioned deficits.    OBJECTIVE IMPAIRMENTS Abnormal gait, decreased  activity tolerance, decreased balance, decreased cognition, decreased endurance, decreased knowledge of condition, decreased knowledge of use of DME, decreased mobility, difficulty walking, decreased strength, and dizziness.   ACTIVITY LIMITATIONS lifting, bending, standing, squatting, sleeping, stairs, bed mobility, hygiene/grooming, locomotion level, and caring for others  PARTICIPATION LIMITATIONS: meal prep, cleaning, interpersonal relationship, driving, shopping, community activity, and occupation  PERSONAL FACTORS Age, Behavior pattern, Past/current experiences, Sex, Time since onset of injury/illness/exacerbation, Transportation, and 3+ comorbidities: bipolar, anxiety, hyperlipidemia  are also affecting patient's functional outcome.   REHAB POTENTIAL: Good  CLINICAL DECISION MAKING: Evolving/moderate complexity  EVALUATION COMPLEXITY: Moderate   PLAN: PT FREQUENCY: 2x/week  PT DURATION: 6 weeks  PLANNED INTERVENTIONS: Therapeutic exercises, Therapeutic activity, Neuromuscular re-education, Balance training, Gait training, Patient/Family education, Self Care, Joint mobilization, Stair training, Vestibular training, Canalith repositioning,  Visual/preceptual remediation/compensation, DME instructions, Aquatic Therapy, Manual therapy, and Re-evaluation  PLAN FOR NEXT SESSION: DVA, MSQ, FGA, HEP, reassess dix hallpike   Debbora Dus, PT Debbora Dus, PT, DPT, CBIS  05/07/2022, 8:54 AM

## 2022-05-09 ENCOUNTER — Ambulatory Visit: Payer: Medicare PPO

## 2022-05-12 ENCOUNTER — Ambulatory Visit: Payer: Medicare PPO

## 2022-05-13 ENCOUNTER — Ambulatory Visit: Payer: Medicare PPO

## 2022-05-13 DIAGNOSIS — M6281 Muscle weakness (generalized): Secondary | ICD-10-CM | POA: Diagnosis not present

## 2022-05-13 DIAGNOSIS — G3184 Mild cognitive impairment, so stated: Secondary | ICD-10-CM | POA: Diagnosis not present

## 2022-05-13 DIAGNOSIS — R296 Repeated falls: Secondary | ICD-10-CM | POA: Diagnosis not present

## 2022-05-13 DIAGNOSIS — R42 Dizziness and giddiness: Secondary | ICD-10-CM | POA: Diagnosis not present

## 2022-05-13 DIAGNOSIS — H8110 Benign paroxysmal vertigo, unspecified ear: Secondary | ICD-10-CM | POA: Diagnosis not present

## 2022-05-13 DIAGNOSIS — Z9181 History of falling: Secondary | ICD-10-CM | POA: Diagnosis not present

## 2022-05-13 NOTE — Therapy (Signed)
OUTPATIENT PHYSICAL THERAPY VESTIBULAR TREATMENT NOTE   Patient Name: [name] MRN: [id number] DOB:04-12-1961, 61 y.o., female Today's Date: 05/13/2022  PCP: Betty [name], MD REFERRING PROVIDER: Alda Berthold, DO    PT End of Session - 05/13/22 0816     Visit Number 2    Number of Visits 13    Date for PT Re-Evaluation 06/25/22    Authorization Type [employer]    Progress Note Due on Visit 10    PT Start Time 0815   patient late   PT Stop Time 0845    PT Time Calculation (min) 30 min    Equipment Utilized During Treatment Gait belt    Activity Tolerance Patient tolerated treatment well    Behavior During Therapy [facility] for tasks assessed/performed             Past Medical History:  Diagnosis Date   Allergic rhinitis 03/26/2007   Anginal pain ([place]) 2001   Autonomic dysfunction 01/11/2015   Bipolar 1 disorder    Hx of psychosis; present since age 70   Chronic fatigue 06/21/2017   Community acquired pneumonia of right lung 40/98/1191   Complication of anesthesia    agitation   Costochondritis 02/14/2014   Diverticular disease of colon 08/06/2020   Double vision 03/15/2011   Dyspnea on effort 01/10/2011   Dysuria 02/15/2013   Elevated lactic acid level    Endometriosis 05/11/2008   Fatty liver disease, nonalcoholic 47/82/9562   Frequency of urination    Gastroesophageal reflux disease 08/06/2020   Headache    History of duodenal ulcer    History of kidney stones    History of lithium toxicity 05/01/2014   Hyperglycemia 01/10/2011   Hyperlipidemia, mixed 05/12/2017   Hyperprolactinemia 06/26/2015   Insomnia 05/12/2017   Irritable bowel syndrome 08/06/2020   Jaw pain 02/13/2012   poss tmj area     Leukocytosis 05/01/2014   Multinodular goiter 05/12/2017   Nocturia    Nondiabetic gastroparesis 01/11/2015   Pelvic pain    Plantar fasciitis 06/11/2007   Pneumonia 08/20/2020   Pruritus ani 08/06/2020   Schizoaffective disorder, bipolar type     Unclear if this is not better accounted for by longstanding history of bipolar disorder   Social anxiety disorder    Subacute bronchitis 07/10/2018   Thyroid nodule 02/02/2017   Urgency of urination    Vitamin D deficiency 09/23/2011   Past Surgical History:  Procedure Laterality Date   CARDIAC CATHETERIZATION  10-09-1999  DR KATZ   NORMAL LVF/  NORMAL RCA/  NO CRITICAL DISEASE LEFT CORONARY SYSTEM   CARDIOVASCULAR STRESS TEST  01-23-2011   NORMAL NUCLEAR STUDY/ NO ISCHEMIA/ EF 81%   CYSTOSCOPY WITH BIOPSY N/A 06/03/2013   Procedure: CYSTOSCOPY WITH BIOPSY  INSTILLATION OF MARCAINE AND PYRIDIUM;  Surgeon: Hanley Ben, MD;  Location: [place];  Service: Urology;  Laterality: N/A;   LAPAROSCOPIC CHOLECYSTECTOMY  11-08-2001   LAPAROSCOPIC LEFT SALPINGOOPHORECTOMY AND LYSYS ADHESIONS  03-10-2002   LAPAROSCOPIC REMOVAL OVARY REMNANT  2003   [place]   LAPAROSCOPY N/A 12/14/2012   Procedure: LAPAROSCOPY DIAGNOSTIC;  Surgeon: Stark Klein, MD;  Location: [facility];  Service: General;  Laterality: N/A;   THYROIDECTOMY N/A 05/24/2021   Procedure: TOTAL THYROIDECTOMY;  Surgeon: Armandina Gemma, MD;  Location: [facility];  Service: General;  Laterality: N/A;   TOTAL ABDOMINAL HYSTERECTOMY  2000   W/ RIGHT SALPINGOOPHORECTOMY   TRANSTHORACIC ECHOCARDIOGRAM  10-13-2010   NORMAL LVF/  EF 55-60%  Patient Active Problem List   Diagnosis Date Noted   Noninvasive follicular neoplasm of thyroid with papillary-like nuclear features 10/10/2021   Diabetes mellitus type II, controlled ([place]) 10/10/2021   Type 2 diabetes mellitus with other specified complication ([place]) 25/36/6440   Postsurgical hypothyroidism 09/03/2021   B12 deficiency 09/03/2021   Neoplasm of uncertain behavior of thyroid gland 05/19/2021   Social anxiety disorder    Elevated lactic acid level    Community acquired pneumonia of right lung 08/17/2020   Diverticular disease of colon 08/06/2020   Fecal urgency 08/06/2020    Flatulence, eructation and gas pain 08/06/2020   Gastroesophageal reflux disease 08/06/2020   Irritable bowel syndrome 08/06/2020   Left upper quadrant pain 34/74/2595   Periumbilical pain 63/87/5643   Pruritus ani 08/06/2020   Rectal bleeding 08/06/2020   Chronic fatigue 06/21/2017   Hyperlipidemia, mixed 05/12/2017   Insomnia 05/12/2017   Multinodular goiter 05/12/2017   Thyroid nodule 02/02/2017   Hyperprolactinemia 06/26/2015   Schizoaffective disorder, bipolar type 06/21/2015   Autonomic dysfunction 01/11/2015   Bipolar 1 disorder 01/11/2015   Nondiabetic gastroparesis 01/11/2015   History of lithium toxicity 05/01/2014   Leukocytosis 05/01/2014   Costochondritis 02/14/2014   Fatty liver disease, nonalcoholic 32/95/1884   Early satiety 04/27/2013   Abnormal LFTs 04/06/2013   Dysuria 02/15/2013   Lump of breast, left 12/20/2012   Loss of weight 11/08/2012   Food aversion 11/08/2012   Constipation 05/17/2012   Right ear pain 02/16/2012   Jaw pain 02/13/2012   Vitamin D deficiency 09/23/2011   Loose stools 09/23/2011   Double vision 03/15/2011   Hyperglycemia 01/10/2011   Dyspnea on effort 01/10/2011   Palpitations 12/08/2010   Endometriosis 05/11/2008   Plantar fasciitis 06/11/2007   Allergic rhinitis 03/26/2007    ONSET DATE: 04/21/22  REFERRING DIAG: H81.10 (ICD-10-CM) - Benign paroxysmal positional vertigo, unspecified laterality G31.84 (ICD-10-CM) - MCI (mild cognitive impairment) R29.6 (ICD-10-CM) - Falls frequently   THERAPY DIAG:  Dizziness and giddiness  Muscle weakness (generalized)  Personal history of fall  Rationale for Evaluation and Treatment Rehabilitation  PERTINENT HISTORY: bipolar depression, anxiety, GERD, hyperlipidemia, and IBS   PRECAUTIONS: falls  SUBJECTIVE: Patient reports doing well. Hasn't had any instances of dizziness since eval. Denies falls/near falls.   PAIN:  Are you having pain? No    OBJECTIVE:   POSITIONAL  TESTS:  Right Dix-Hallpike: no nystagmus Left Dix-Hallpike: no nystagmus   VESTIBULAR TREATMENT: Habituation: Seated Vertical Head Movements: number of reps: 10, Seated Horizontal Head Movements: number of reps: 10, and Bending/Reaching to the Floor: number of reps: 8    VESTIBULAR TREATMENT: Motion Sensitivity Quotient  Intensity: 0 = none, 1 = Lightheaded, 2 = Mild, 3 = Moderate, 4 = Severe, 5 = Vomiting  Intensity  1. Sitting to supine 2  2. Supine to L side 2  3. Supine to R side 2  4. Supine to sitting 2  5. L Hallpike-Dix 2  6. Up from L  3  7. R Hallpike-Dix 0  8. Up from R  3  9. Sitting, head  tipped to L knee 2  10. Head up from L  knee 3  11. Sitting, head  tipped to R knee 2  12. Head up from R  knee 3  13. Sitting head turns x5 3  14.Sitting head nods x5 2  15. In stance, 180  turn to L  1  16. In stance, 180  turn to R 1  -DVA: Static:  line 7; dynamic line 7  -  [facility] PT Assessment - 05/13/22 0001       Functional Gait  Assessment   Gait assessed  Yes    Gait Level Surface Walks 20 ft, slow speed, abnormal gait pattern, evidence for imbalance or deviates 10-15 in outside of the 12 in walkway width. Requires more than 7 sec to ambulate 20 ft.    Change in Gait Speed Able to change speed, demonstrates mild gait deviations, deviates 6-10 in outside of the 12 in walkway width, or no gait deviations, unable to achieve a major change in velocity, or uses a change in velocity, or uses an assistive device.    Gait with Horizontal Head Turns Performs head turns with moderate changes in gait velocity, slows down, deviates 10-15 in outside 12 in walkway width but recovers, can continue to walk.    Gait with Vertical Head Turns Performs task with moderate change in gait velocity, slows down, deviates 10-15 in outside 12 in walkway width but recovers, can continue to walk.    Gait and Pivot Turn Pivot turns safely in greater than 3 sec and stops with no loss of  balance, or pivot turns safely within 3 sec and stops with mild imbalance, requires small steps to catch balance.    Step Over Obstacle Is able to step over one shoe box (4.5 in total height) without changing gait speed. No evidence of imbalance.    Gait with Narrow Base of Support Ambulates less than 4 steps heel to toe or cannot perform without assistance.    Gait with Eyes Closed Walks 20 ft, slow speed, abnormal gait pattern, evidence for imbalance, deviates 10-15 in outside 12 in walkway width. Requires more than 9 sec to ambulate 20 ft.    Ambulating Backwards Walks 20 ft, slow speed, abnormal gait pattern, evidence for imbalance, deviates 10-15 in outside 12 in walkway width.    Steps Alternating feet, must use rail.    Total Score 13            HOME EXERCISE PROGRAM  Bending / Picking Up Objects    Sitting, slowly bend head down and pick up object on the floor. Return to upright position. Hold position until symptoms subside. Repeat ___8_ times per session. Do __2__ sessions per day. Head Motion: Side to Side    Sitting, tilt head down 15-30, slowly move head to right with eyes open. Hold position until symptoms subside. Then, move head slowly to opposite side. Hold position until symptoms subside. Repeat ___10_ times per session. Do __2__ sessions per day. Head Motion: Up and Down    Sitting, slowly move head up with eyes open. Hold position until symptoms subside. Then, move head in opposite direction. Hold position until symptoms subside. Repeat _10___ times per session. Do __2__ sessions per day.     PATIENT EDUCATION: Education details: PT POC, testing results, BPPV vs hypofunction Person educated: Patient and Spouse Education method: Explanation Education comprehension: verbalized understanding and needs further education     GOALS: Goals reviewed with patient? Yes   SHORT TERM GOALS: Target date: 05/28/2022    Pt will be independent with initial HEP for  improved balance  Baseline: to be provided Goal status: INITIAL   2.  MSQ to be assessed and goal set as appropriate Baseline: to be assessed Goal status: INITIAL   3.  FGA to be assessed and goal set as appropriate Baseline: to be assessed Goal status: INITIAL  4.  Patient will demonstrate (-) positional testing to indicate resolution of BPPV   Baseline: to be re-assessed Goal status: INITIAL     LONG TERM GOALS: Target date: 06/18/2022     Pt will be independent with final HEP for improved Baseline: to be provided Goal status: INITIAL   2.  FGA goal Baseline: to be assessed Goal status: INITIAL   3.  Patient will improve FOTO score to >/= 56  Baseline: 39 Goal status: INITIAL     5.  FGA goal Baseline: to be assessed Goal status: INITIAL   6.  MSQ goal  Baseline: to be assessed Goal status: INITIAL   ASSESSMENT:   CLINICAL IMPRESSION: Patient seen for skilled PT session with emphasis on vestibular assessment and HEP establishment. Patient demonstrates increased fall risk as noted by score of 13/30 on  Functional Gait Assessment.   <22/30 = predictive of falls, <20/30 = fall in 6 months, <18/30 = predictive of falls in PD MCID: 5 points stroke population, 4 points geriatric population (ANPTA Core Set of Outcome Measures for Adults with Neurologic Conditions, 2018). Patient scoring some items on MSQ as 3/5 indicating moderate dizziness, primarily with head turns and returning to upright from dependent positions. Dix hallpike (-) B with no symptom report of BPPV. Patient completing DVA with equal lines scoring WNL. Tolerating habituation exercises well with decreasing dizziness. Continue POC.     OBJECTIVE IMPAIRMENTS Abnormal gait, decreased activity tolerance, decreased balance, decreased cognition, decreased endurance, decreased knowledge of condition, decreased knowledge of use of DME, decreased mobility, difficulty walking, decreased strength, and dizziness.     ACTIVITY LIMITATIONS lifting, bending, standing, squatting, sleeping, stairs, bed mobility, hygiene/grooming, locomotion level, and caring for others   PARTICIPATION LIMITATIONS: meal prep, cleaning, interpersonal relationship, driving, shopping, community activity, and occupation   PERSONAL FACTORS Age, Behavior pattern, Past/current experiences, Sex, Time since onset of injury/illness/exacerbation, Transportation, and 3+ comorbidities: bipolar, anxiety, hyperlipidemia  are also affecting patient's functional outcome.    REHAB POTENTIAL: Good   CLINICAL DECISION MAKING: Evolving/moderate complexity   EVALUATION COMPLEXITY: Moderate     PLAN: PT FREQUENCY: 2x/week   PT DURATION: 6 weeks   PLANNED INTERVENTIONS: Therapeutic exercises, Therapeutic activity, Neuromuscular re-education, Balance training, Gait training, Patient/Family education, Self Care, Joint mobilization, Stair training, Vestibular training, Canalith repositioning, Visual/preceptual remediation/compensation, DME instructions, Aquatic Therapy, Manual therapy, and Re-evaluation   PLAN FOR NEXT SESSION: habituation, NBOS, head turns, EC, compliant surfaces   Debbora Dus, PT Debbora Dus, PT, DPT, CBIS  05/13/2022, 8:47 AM

## 2022-05-21 ENCOUNTER — Ambulatory Visit: Payer: Medicare PPO

## 2022-05-21 NOTE — Therapy (Unsigned)
[facility] [address] [place], [place], [address] Phone: [phone]   Fax:  [phone]  Patient Details  Name: [name] MRN: [id number] Date of Birth: [birth year] Referring Provider:  No ref. provider found  Encounter Date: 05/21/2022  PHYSICAL THERAPY DISCHARGE SUMMARY  Visits from Start of Care: 2  Current functional level related to goals / functional outcomes: At last assessment, patient remains with intermittent vertigo exacerbated by position changes, but no BPPV noted. She remains a fall risk.    Remaining deficits: See above   Education / Equipment: PT POC, HEP, fall precautions   Patient agrees to discharge. Patient goals were  unable to be assessed as patient selecting to self-dc as wife is unable to bring her to her appts . Patient is being discharged due to the patient's request.   Debbora Dus, PT Debbora Dus, PT, DPT, CBIS  05/21/2022, 8:10 AM  [facility] [address] [place], [place], [address] Phone: [phone]   Fax:  [phone]

## 2022-05-23 ENCOUNTER — Ambulatory Visit: Payer: Medicare PPO

## 2022-05-23 ENCOUNTER — Telehealth: Payer: Self-pay | Admitting: Family Medicine

## 2022-05-23 DIAGNOSIS — R2681 Unsteadiness on feet: Secondary | ICD-10-CM

## 2022-05-23 DIAGNOSIS — R2689 Other abnormalities of gait and mobility: Secondary | ICD-10-CM

## 2022-05-23 NOTE — Telephone Encounter (Signed)
[place] [facility] neurologist [place] phone [phone]. Pt states she wants a second opinion. Having a hard time walking. Left leg

## 2022-05-26 ENCOUNTER — Ambulatory Visit: Payer: Medicare PPO | Admitting: Physical Therapy

## 2022-05-26 NOTE — Telephone Encounter (Signed)
Referral placed.

## 2022-05-26 NOTE — Addendum Note (Signed)
Addended by: Rodrigo Ran on: 05/26/2022 09:43 AM   Modules accepted: Orders

## 2022-05-28 ENCOUNTER — Encounter: Payer: Medicare PPO | Admitting: Physical Therapy

## 2022-06-02 ENCOUNTER — Encounter: Payer: Medicare PPO | Admitting: Physical Therapy

## 2022-06-09 ENCOUNTER — Encounter: Payer: Medicare PPO | Admitting: Physical Therapy

## 2022-06-10 DIAGNOSIS — F3131 Bipolar disorder, current episode depressed, mild: Secondary | ICD-10-CM | POA: Diagnosis not present

## 2022-06-13 ENCOUNTER — Telehealth: Payer: Self-pay

## 2022-06-13 NOTE — Telephone Encounter (Signed)
Caller stated her wife metformin 500 1 pill after dinner, and accidentally took 3 more pill's. States she takes Depakote 3 tabs at night, and accidently took 3 Metformin  06/13/2022 1:10:53 AM Call [place] [facility] [place], RN, Carmelia Bake  Comments User: Leatrice Jewels, RN Date/Time Eilene Ghazi Time): 06/13/2022 1:04:25 AM Current FSBG 130  06/13/22 1022 - Pt states she feels fine. States she did call poison control last night & was advised to keep checking BG. States BG at 1000 is 130. Declines OV with PCP.   Of note: last CPE 09/03/21. Scheduled next CPE for 09/05/22

## 2022-07-02 NOTE — Progress Notes (Unsigned)
ACUTE VISIT Chief Complaint  Patient presents with   Form Completion    Handicap placard   HPI: [name] is a 61 y.o. female with hx of post surgical hypothyroidism,gastroparesis, DM II, HLD, bipolar disorder,and unstable gait here today requesting handicap sticker, balance is getting worse, she has had a few falls. She is using a cane for transfer. Also concerned about cognitive decline. She has seen Dr Posey Pronto, neurologist but has not been able to see her, so arranged appt at [facility]. HPI Her wife reports that her personality has changed, becoming more easily annoyed, especially late afternoon and night. She has been experiencing decreased appetite and increased fatigue, which she believes may be related to depression. In general she is sleeping well but lately she is waking up early and having trouble going back to sleep. She has been attending regular psychiatrist appointments, and states that her psychiatrist does not believe her memory issues are related to bipolar disorder.  She has a family history of dementia, as her mother passed away from it this year. The patient's memory has been worsening, with difficulties remembering dates, names, and needing to be told the same information repeatedly. She also experiences disorientation in time.  DM II: She has not followed with her endocrinologist since 09/2021, Her last A1c was 6.9 in February, and she has not had any blood work since then.  She checks her blood sugar daily, which was 93 this morning, and takes Metformin 500 mg twice daily.  Post surgical hypothyroidism: Last TSH mildly abnormal.  She is on Levothyroxine 100 mcg daily.  Lab Results  Component Value Date   TSH 6.73 (H) 12/25/2021   Review of Systems  Constitutional:  Negative for fever.  Respiratory:  Negative for cough and shortness of breath.   Cardiovascular:  Negative for chest pain and leg swelling.  Gastrointestinal:  Negative for abdominal pain and  vomiting.  Endocrine: Negative for cold intolerance and heat intolerance.  Genitourinary:  Negative for decreased urine volume, dysuria and hematuria.  Skin:  Negative for rash.  Neurological:  Negative for syncope and headaches.  Psychiatric/Behavioral:  Negative for hallucinations.   See other pertinent positives and negatives in HPI.  Current Outpatient Medications on File Prior to Visit  Medication Sig Dispense Refill   acetaminophen (TYLENOL) 325 MG tablet Take 650 mg by mouth every 6 (six) hours as needed for mild pain, fever or headache.     aspirin-acetaminophen-caffeine (EXCEDRIN MIGRAINE) 250-250-65 MG tablet Take 2 tablets by mouth every 6 (six) hours as needed for headache.     Blood Glucose Monitoring Suppl (ONE TOUCH ULTRA 2) w/Device KIT Use to test blood sugars 1-2 times daily. 1 kit 0   buPROPion (WELLBUTRIN XL) 150 MG 24 hr tablet Take 450 mg by mouth every morning.     calcium carbonate (TUMS) 500 MG chewable tablet Chew 2 tablets (400 mg of elemental calcium total) by mouth 3 (three) times daily. 90 tablet 1   divalproex (DEPAKOTE ER) 250 MG 24 hr tablet Take 750 mg by mouth at bedtime.     glucose blood (ONETOUCH ULTRA) test strip Use to test blood sugars 1-2 times daily. 200 each 3   Lancets (ONETOUCH ULTRASOFT) lancets Use to test blood sugars 1-2 times daily. 200 each 3   levothyroxine (SYNTHROID) 100 MCG tablet Take 1 tablet (100 mcg total) by mouth daily. 90 tablet 3   LORazepam (ATIVAN) 0.5 MG tablet Take 0.5-1 mg by mouth daily as needed for  anxiety.     meclizine (ANTIVERT) 25 MG tablet Take 1 tablet (25 mg total) by mouth 3 (three) times daily as needed for dizziness. 30 tablet 0   metFORMIN (GLUCOPHAGE) 500 MG tablet Take 1 tablet (500 mg total) by mouth 2 (two) times daily with a meal. 180 tablet 2   Multiple Vitamin (MULTIVITAMIN WITH MINERALS) TABS tablet Take 1 tablet by mouth daily.     ondansetron (ZOFRAN) 4 MG tablet Take 1 tablet (4 mg total) by mouth  every 8 (eight) hours as needed for nausea or vomiting. 20 tablet 0   pravastatin (PRAVACHOL) 20 MG tablet Take 1 tablet by mouth once daily 90 tablet 2   albuterol (VENTOLIN HFA) 108 (90 Base) MCG/ACT inhaler Inhale 2 puffs into the lungs every 6 (six) hours as needed for wheezing or shortness of breath. 18 g 1   No current facility-administered medications on file prior to visit.   Past Medical History:  Diagnosis Date   Allergic rhinitis 03/26/2007   Anginal pain ([place]) 2001   Autonomic dysfunction 01/11/2015   Bipolar 1 disorder    Hx of psychosis; present since age 49   Chronic fatigue 06/21/2017   Community acquired pneumonia of right lung 37/62/8315   Complication of anesthesia    agitation   Costochondritis 02/14/2014   Diverticular disease of colon 08/06/2020   Double vision 03/15/2011   Dyspnea on effort 01/10/2011   Dysuria 02/15/2013   Elevated lactic acid level    Endometriosis 05/11/2008   Fatty liver disease, nonalcoholic 17/61/6073   Frequency of urination    Gastroesophageal reflux disease 08/06/2020   Headache    History of duodenal ulcer    History of kidney stones    History of lithium toxicity 05/01/2014   Hyperglycemia 01/10/2011   Hyperlipidemia, mixed 05/12/2017   Hyperprolactinemia 06/26/2015   Insomnia 05/12/2017   Irritable bowel syndrome 08/06/2020   Jaw pain 02/13/2012   poss tmj area     Leukocytosis 05/01/2014   Multinodular goiter 05/12/2017   Nocturia    Nondiabetic gastroparesis 01/11/2015   Pelvic pain    Plantar fasciitis 06/11/2007   Pneumonia 08/20/2020   Pruritus ani 08/06/2020   Schizoaffective disorder, bipolar type    Unclear if this is not better accounted for by longstanding history of bipolar disorder   Social anxiety disorder    Subacute bronchitis 07/10/2018   Thyroid nodule 02/02/2017   Urgency of urination    Vitamin D deficiency 09/23/2011   Allergies  Allergen Reactions   Morphine And Related Nausea And Vomiting  and Nausea Only   Ambien [Zolpidem] Other (See Comments)    Per patient this caused her to fall   Duloxetine Swelling and Other (See Comments)    Other reaction(s): SWELLING/EDEMA   Latuda [Lurasidone Hcl]    Morphine Nausea Only   Shingrix [Zoster Vac Recomb Adjuvanted]     Dizziness vomiting    Social History   Socioeconomic History   Marital status: Married    Spouse name: Not on file   Number of children: 0   Years of education: 16   Highest education level: Bachelor's degree (e.g., BA, AB, BS)  Occupational History   Occupation: Disability    Comment: Nurse  Tobacco Use   Smoking status: Former    Packs/day: 0.01    Years: 1.00    Total pack years: 0.01    Types: Cigarettes    Quit date: 1980    Years since quitting: 72.9  Smokeless tobacco: Never   Tobacco comments:    ONLY SMOKED FOR 6 MONTHS --  QUIT  YRS AGO  Vaping Use   Vaping Use: Never used  Substance and Sexual Activity   Alcohol use: Yes    Comment: occ   Drug use: Never   Sexual activity: Not on file  Other Topics Concern   Not on file  Social History Narrative   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    no tobacco   Caffeine Use: very little, three times a week   Left handed    Social Determinants of Health   Financial Resource Strain: Low Risk  (12/27/2021)   Overall Financial Resource Strain (CARDIA)    Difficulty of Paying Living Expenses: Not hard at all  Food Insecurity: No Food Insecurity (12/27/2021)   Hunger Vital Sign    Worried About Running Out of Food in the Last Year: Never true    Ran Out of Food in the Last Year: Never true  Transportation Needs: No Transportation Needs (12/27/2021)   PRAPARE - Hydrologist (Medical): No    Lack of Transportation (Non-Medical): No  Physical Activity: Insufficiently Active (12/27/2021)    Exercise Vital Sign    Days of Exercise per Week: 3 days    Minutes of Exercise per Session: 20 min  Stress: No Stress Concern Present (12/27/2021)   [place]    Feeling of Stress : Not at all  Social Connections: Moderately Isolated (12/27/2021)   Social Connection and Isolation Panel [NHANES]    Frequency of Communication with Friends and Family: More than three times a week    Frequency of Social Gatherings with Friends and Family: More than three times a week    Attends Religious Services: Never    Marine scientist or Organizations: No    Attends Archivist Meetings: Never    Marital Status: Married   Vitals:   07/04/22 0927  BP: 120/80  Pulse: 100  Resp: 16  Temp: 98.5 F (36.9 C)  SpO2: 94%   Body mass index is 29.32 kg/m.  Physical Exam Vitals and nursing note reviewed.  Constitutional:      General: She is not in acute distress.    Appearance: She is well-developed.  HENT:     Head: Normocephalic and atraumatic.     Mouth/Throat:     Mouth: Mucous membranes are moist.     Pharynx: Oropharynx is clear.  Eyes:     Conjunctiva/sclera: Conjunctivae normal.  Cardiovascular:     Rate and Rhythm: Normal rate and regular rhythm.     Heart sounds: No murmur heard. Pulmonary:     Effort: Pulmonary effort is normal. No respiratory distress.     Breath sounds: Normal breath sounds.  Abdominal:     Palpations: Abdomen is soft. There is no hepatomegaly or mass.     Tenderness: There is no abdominal tenderness.  Lymphadenopathy:     Cervical: No cervical adenopathy.  Skin:    General: Skin is warm.     Findings: No erythema or rash.  Neurological:     General: No focal deficit present.     Mental Status: She is alert.     Cranial Nerves: No cranial nerve deficit.     Comments: Does  not remember date, "06/15/21 or 2023" Gait assisted with a cane.  Psychiatric:        Mood and Affect:  Affect normal. Mood is anxious.   ASSESSMENT AND PLAN:  Lab Results  Component Value Date   HGBA1C 5.7 07/04/2022   Postsurgical hypothyroidism Assessment & Plan: Last TSH mildly abnormal. I thinks she can wait to have TSH repeated until she sees her endocrinologist. Continue Levothyroxine 100 mcg daily.   Memory changes Assessment & Plan: Wife reporting recent memory issues and disorientation in time. We discussed possible etiologies. Positive family history of dementia (mother) Encouraged to engage in cognitive challenging exercises. She has appt with neurologist.   Unstable gait Assessment & Plan: Continue fall precautions. She has completed PT. Some of her medications can aggravate problem. Handicap sticker proceeded. Keep appt with neurologist.   Type 2 diabetes mellitus with other specified complication, without long-term current use of insulin ([place]) Assessment & Plan: HgA1C at goal, went from 6.9 to 5.7. Continue Metformin 500 mg bid. Annual eye exam, periodic dental and foot care recommended. Continue following with endocrinologist.   Orders: -     POCT glycosylated hemoglobin (Hb A1C)  Return if symptoms worsen or fail to improve, for keep next appointment.   G. [name], MD  [facility]. [place] office.

## 2022-07-04 ENCOUNTER — Encounter: Payer: Self-pay | Admitting: Family Medicine

## 2022-07-04 ENCOUNTER — Ambulatory Visit (INDEPENDENT_AMBULATORY_CARE_PROVIDER_SITE_OTHER): Payer: Medicare PPO | Admitting: Family Medicine

## 2022-07-04 VITALS — BP 120/80 | HR 100 | Temp 98.5°F | Resp 16 | Ht 60.0 in | Wt 150.1 lb

## 2022-07-04 DIAGNOSIS — E1169 Type 2 diabetes mellitus with other specified complication: Secondary | ICD-10-CM | POA: Diagnosis not present

## 2022-07-04 DIAGNOSIS — R413 Other amnesia: Secondary | ICD-10-CM | POA: Diagnosis not present

## 2022-07-04 DIAGNOSIS — E890 Postprocedural hypothyroidism: Secondary | ICD-10-CM

## 2022-07-04 DIAGNOSIS — R2681 Unsteadiness on feet: Secondary | ICD-10-CM | POA: Diagnosis not present

## 2022-07-04 LAB — POCT GLYCOSYLATED HEMOGLOBIN (HGB A1C): HbA1c, POC (prediabetic range): 5.7 % (ref 5.7–6.4)

## 2022-07-04 NOTE — Patient Instructions (Addendum)
A few things to remember from today's visit:  Postsurgical hypothyroidism  Controlled type 2 diabetes mellitus with other specified complication, without long-term current use of insulin ([place])  Memory changes  Unstable gait  Type 2 diabetes mellitus with other specified complication, without long-term current use of insulin ([facility])  A1C 5.7. Continue Metformin same dose for now. No changes in Levothyroxine. Be sure to have a follow up with endocrinologist.  Fall precautions. Handicap sticker given today. Keep appt with neuro.  Memory Compensation Strategies  Use "WARM" strategy.  W= write it down  A= associate it  R= repeat it  M= make a mental note  2.   You can keep a Social worker.  Use a 3-ring notebook with sections for the following: calendar, important names and phone numbers,  medications, doctors' names/phone numbers, lists/reminders, and a section to journal what you did  each day.   3.    Use a calendar to write appointments down.  4.    Write yourself a schedule for the day.  This can be placed on the calendar or in a separate section of the Memory Notebook.  Keeping a  regular schedule can help memory.  5.    Use medication organizer with sections for each day or morning/evening pills.  You may need help loading it  6.    Keep a basket, or pegboard by the door.  Place items that you need to take out with you in the basket or on the pegboard.  You may also want to  include a message board for reminders.  7.    Use sticky notes.  Place sticky notes with reminders in a place where the task is performed.  For example: " turn off the  stove" placed by the stove, "lock the door" placed on the door at eye level, " take your medications" on  the bathroom mirror or by the place where you normally take your medications.  8.    Use alarms/timers.  Use while cooking to remind yourself to check on food or as a reminder to take your medicine, or as a  reminder to make a  call, or as a reminder to perform another task, etc.  If you need refills for medications you take chronically, please call your pharmacy. Do not use My Chart to request refills or for acute issues that need immediate attention. If you send a my chart message, it may take a few days to be addressed, specially if I am not in the office.  Please be sure medication list is accurate. If a new problem present, please set up appointment sooner than planned today.

## 2022-07-04 NOTE — Assessment & Plan Note (Signed)
Last TSH mildly abnormal. I thinks she can wait to have TSH repeated until she sees her endocrinologist. Continue Levothyroxine 100 mcg daily.

## 2022-07-04 NOTE — Assessment & Plan Note (Signed)
HgA1C at goal. No changes in current management. Annual eye exam, periodic dental and foot care recommended.

## 2022-07-05 DIAGNOSIS — R413 Other amnesia: Secondary | ICD-10-CM | POA: Insufficient documentation

## 2022-07-05 DIAGNOSIS — R2681 Unsteadiness on feet: Secondary | ICD-10-CM | POA: Insufficient documentation

## 2022-07-05 HISTORY — DX: Unsteadiness on feet: R26.81

## 2022-07-05 NOTE — Assessment & Plan Note (Signed)
Wife reporting recent memory issues and disorientation in time. We discussed possible etiologies. Positive family history of dementia (mother) Encouraged to engage in cognitive challenging exercises. She has appt with neurologist.

## 2022-07-05 NOTE — Assessment & Plan Note (Signed)
Continue fall precautions. She has completed PT. Some of her medications can aggravate problem. Handicap sticker proceeded. Keep appt with neurologist.

## 2022-07-07 DIAGNOSIS — F3131 Bipolar disorder, current episode depressed, mild: Secondary | ICD-10-CM | POA: Diagnosis not present

## 2022-07-14 ENCOUNTER — Other Ambulatory Visit: Payer: Self-pay | Admitting: Internal Medicine

## 2022-07-14 DIAGNOSIS — E1169 Type 2 diabetes mellitus with other specified complication: Secondary | ICD-10-CM

## 2022-07-15 ENCOUNTER — Other Ambulatory Visit: Payer: Self-pay

## 2022-07-15 DIAGNOSIS — E1169 Type 2 diabetes mellitus with other specified complication: Secondary | ICD-10-CM

## 2022-07-15 MED ORDER — METFORMIN HCL 500 MG PO TABS: 500.0000 mg | ORAL_TABLET | Freq: Two times a day (BID) | ORAL | 0 refills | Status: DC

## 2022-07-16 DIAGNOSIS — R2681 Unsteadiness on feet: Secondary | ICD-10-CM | POA: Diagnosis not present

## 2022-07-16 DIAGNOSIS — F0390 Unspecified dementia without behavioral disturbance: Secondary | ICD-10-CM | POA: Diagnosis not present

## 2022-07-24 DIAGNOSIS — R2681 Unsteadiness on feet: Secondary | ICD-10-CM | POA: Diagnosis not present

## 2022-07-28 DIAGNOSIS — F3131 Bipolar disorder, current episode depressed, mild: Secondary | ICD-10-CM | POA: Diagnosis not present

## 2022-08-06 ENCOUNTER — Ambulatory Visit: Payer: Medicare PPO | Admitting: Neurology

## 2022-08-07 DIAGNOSIS — R194 Change in bowel habit: Secondary | ICD-10-CM | POA: Diagnosis not present

## 2022-08-07 DIAGNOSIS — K5730 Diverticulosis of large intestine without perforation or abscess without bleeding: Secondary | ICD-10-CM | POA: Diagnosis not present

## 2022-08-07 DIAGNOSIS — K449 Diaphragmatic hernia without obstruction or gangrene: Secondary | ICD-10-CM | POA: Diagnosis not present

## 2022-08-07 DIAGNOSIS — Z1211 Encounter for screening for malignant neoplasm of colon: Secondary | ICD-10-CM | POA: Diagnosis not present

## 2022-08-08 DIAGNOSIS — F29 Unspecified psychosis not due to a substance or known physiological condition: Secondary | ICD-10-CM | POA: Diagnosis not present

## 2022-08-19 ENCOUNTER — Ambulatory Visit: Payer: Medicare PPO | Admitting: Internal Medicine

## 2022-08-19 ENCOUNTER — Encounter: Payer: Self-pay | Admitting: Internal Medicine

## 2022-08-19 VITALS — BP 118/76 | HR 92 | Ht 60.0 in | Wt 151.0 lb

## 2022-08-19 DIAGNOSIS — D497 Neoplasm of unspecified behavior of endocrine glands and other parts of nervous system: Secondary | ICD-10-CM

## 2022-08-19 DIAGNOSIS — E890 Postprocedural hypothyroidism: Secondary | ICD-10-CM | POA: Diagnosis not present

## 2022-08-19 DIAGNOSIS — E785 Hyperlipidemia, unspecified: Secondary | ICD-10-CM

## 2022-08-19 DIAGNOSIS — E1169 Type 2 diabetes mellitus with other specified complication: Secondary | ICD-10-CM

## 2022-08-19 LAB — LIPID PANEL
Cholesterol: 133 mg/dL (ref 0–200)
HDL: 41.6 mg/dL (ref >39.00)
NonHDL: 90.95
Total CHOL/HDL Ratio: 3
Triglycerides: 230 mg/dL — ABNORMAL HIGH (ref 0.0–149.0)
VLDL: 46 mg/dL — ABNORMAL HIGH (ref 0.0–40.0)

## 2022-08-19 LAB — LDL CHOLESTEROL, DIRECT: Direct LDL: 64 mg/dL

## 2022-08-19 LAB — MICROALBUMIN / CREATININE URINE RATIO
Creatinine,U: 160.4 mg/dL
Microalb Creat Ratio: 0.6 mg/g (ref 0.0–30.0)
Microalb, Ur: 0.9 mg/dL (ref 0.0–1.9)

## 2022-08-19 LAB — TSH: TSH: 0.62 u[IU]/mL (ref 0.35–5.50)

## 2022-08-19 MED ORDER — METFORMIN HCL ER 500 MG PO TB24: 1000.0000 mg | ORAL_TABLET | Freq: Every day | ORAL | 3 refills | Status: DC

## 2022-08-19 MED ORDER — LEVOTHYROXINE SODIUM 100 MCG PO TABS: 100.0000 ug | ORAL_TABLET | Freq: Every day | ORAL | 3 refills | Status: DC

## 2022-08-19 NOTE — Progress Notes (Signed)
Name: [name]  MRN/ DOB: [id number], 11-Mar-1961   Age/ Sex: 62 y.o., female    PCP: [name], [name], MD   Reason for Endocrinology Evaluation: Type 2 Diabetes Mellitus     Date of Initial Endocrinology Visit: 10/10/2021    PATIENT IDENTIFIER: [name] is a 63 y.o. female with a past medical history of T2DM, dyslipidemia hypothyroidism. The patient presented for initial endocrinology clinic visit on 10/10/2021 for consultative assistance with her diabetes management.    HPI: [name]    Diagnosed with DM 09/2021              Hemoglobin A1c has ranged from 5.0% in 2022, peaking at 6.9% in 2023.  THYROID HISTORY :  She is S/P total thyroidectomy due to enlarging MNG with pathology report consistent with NIFTP on 05/24/2021  Father with thyroid cancer   On disability for Bipolar d/o    SUBJECTIVE:   During the last visit (10/10/2021): A1c 6.9%  Today (08/19/22): [name] is here for follow-up on diabetes management of thyroid disease.She has NOT been to our clinic in 11 months. checks blood sugars occasionally  times daily. The patient has not had hypoglycemic episodes since the last clinic visit   She is following up with neurology and pending neuropsychiatric testing on June/2023 Denies nausea or vomiting but has chronic diarrhea prior to metofmrin  Denies palpitations or tremors    HOME ENDOCRINE REGIMEN: Metformin 500 mg BID  Levothyroxine 100 mcg daily    Statin: yes ACE-I/ARB: no Prior Diabetic Education: has a pending appointment    METER DOWNLOAD SUMMARY:  8-146  mg/dL      DIABETIC COMPLICATIONS: Microvascular complications:   Denies: CKD, neuropathy  Last eye exam: Completed a while ago   Macrovascular complications:   Denies: CAD, PVD, CVA   PAST HISTORY: Past Medical History:  Past Medical History:  Diagnosis Date   Allergic rhinitis 03/26/2007   Anginal pain ([place]) 2001   Autonomic dysfunction 01/11/2015    Bipolar 1 disorder    Hx of psychosis; present since age 43   Chronic fatigue 06/21/2017   Community acquired pneumonia of right lung 86/76/7209   Complication of anesthesia    agitation   Costochondritis 02/14/2014   Diverticular disease of colon 08/06/2020   Double vision 03/15/2011   Dyspnea on effort 01/10/2011   Dysuria 02/15/2013   Elevated lactic acid level    Endometriosis 05/11/2008   Fatty liver disease, nonalcoholic 47/04/6282   Frequency of urination    Gastroesophageal reflux disease 08/06/2020   Headache    History of duodenal ulcer    History of kidney stones    History of lithium toxicity 05/01/2014   Hyperglycemia 01/10/2011   Hyperlipidemia, mixed 05/12/2017   Hyperprolactinemia 06/26/2015   Insomnia 05/12/2017   Irritable bowel syndrome 08/06/2020   Jaw pain 02/13/2012   poss tmj area     Leukocytosis 05/01/2014   Multinodular goiter 05/12/2017   Nocturia    Nondiabetic gastroparesis 01/11/2015   Pelvic pain    Plantar fasciitis 06/11/2007   Pneumonia 08/20/2020   Pruritus ani 08/06/2020   Schizoaffective disorder, bipolar type    Unclear if this is not better accounted for by longstanding history of bipolar disorder   Social anxiety disorder    Subacute bronchitis 07/10/2018   Thyroid nodule 02/02/2017   Urgency of urination    Vitamin D deficiency 09/23/2011   Past Surgical History:  Past Surgical History:  Procedure Laterality  Date   CARDIAC CATHETERIZATION  10-09-1999  DR Ron Parker   NORMAL LVF/  NORMAL RCA/  NO CRITICAL DISEASE LEFT CORONARY SYSTEM   CARDIOVASCULAR STRESS TEST  01-23-2011   NORMAL NUCLEAR STUDY/ NO ISCHEMIA/ EF 81%   CYSTOSCOPY WITH BIOPSY N/A 06/03/2013   Procedure: CYSTOSCOPY WITH BIOPSY  INSTILLATION OF MARCAINE AND PYRIDIUM;  Surgeon: Hanley Ben, MD;  Location: [facility];  Service: Urology;  Laterality: N/A;   LAPAROSCOPIC CHOLECYSTECTOMY  11-08-2001   LAPAROSCOPIC LEFT SALPINGOOPHORECTOMY AND LYSYS  ADHESIONS  03-10-2002   LAPAROSCOPIC REMOVAL OVARY REMNANT  2003   [place]   LAPAROSCOPY N/A 12/14/2012   Procedure: LAPAROSCOPY DIAGNOSTIC;  Surgeon: Stark Klein, MD;  Location: [facility];  Service: General;  Laterality: N/A;   THYROIDECTOMY N/A 05/24/2021   Procedure: TOTAL THYROIDECTOMY;  Surgeon: Armandina Gemma, MD;  Location: [facility];  Service: General;  Laterality: N/A;   TOTAL ABDOMINAL HYSTERECTOMY  2000   W/ RIGHT SALPINGOOPHORECTOMY   TRANSTHORACIC ECHOCARDIOGRAM  10-13-2010   NORMAL LVF/  EF 55-60%    Social History:  reports that she quit smoking about 44 years ago. Her smoking use included cigarettes. She has a 0.01 pack-year smoking history. She has never used smokeless tobacco. She reports current alcohol use. She reports that she does not use drugs. Family History:  Family History  Problem Relation Age of Onset   Sleep apnea Father    Depression Father    Alcohol abuse Father    Hypertension Father    Migraines Sister        headaches   Anxiety disorder Sister    Other Mother        MAC infection   Anxiety disorder Mother    Dementia Mother        likely Alzheimer's disease; symptom onset on late 70s/early 80s   Heart disease Paternal Grandmother    Hyperlipidemia Paternal Grandmother    Alcohol abuse Paternal Grandfather    Diabetes Neg Hx      HOME MEDICATIONS: Allergies as of 08/19/2022       Reactions   Morphine And Related Nausea And Vomiting, Nausea Only   Ambien [zolpidem] Other (See Comments)   Per patient this caused her to fall   Duloxetine Swelling, Other (See Comments)   Other reaction(s): SWELLING/EDEMA   Latuda [lurasidone Hcl]    Morphine Nausea Only   Shingrix [zoster Vac Recomb Adjuvanted]    Dizziness vomiting        Medication List        Accurate as of August 19, 2022  8:22 AM. If you have any questions, ask your nurse or doctor.          acetaminophen 325 MG tablet Commonly known as: TYLENOL Take 650 mg by mouth every 6  (six) hours as needed for mild pain, fever or headache.   albuterol 108 (90 Base) MCG/ACT inhaler Commonly known as: VENTOLIN HFA Inhale 2 puffs into the lungs every 6 (six) hours as needed for wheezing or shortness of breath.   aspirin-acetaminophen-caffeine 250-250-65 MG tablet Commonly known as: EXCEDRIN MIGRAINE Take 2 tablets by mouth every 6 (six) hours as needed for headache.   buPROPion 150 MG 24 hr tablet Commonly known as: WELLBUTRIN XL Take 450 mg by mouth every morning.   calcium carbonate 500 MG chewable tablet Commonly known as: Tums Chew 2 tablets (400 mg of elemental calcium total) by mouth 3 (three) times daily.   divalproex 250 MG 24 hr tablet  Commonly known as: DEPAKOTE ER Take 750 mg by mouth at bedtime.   levothyroxine 100 MCG tablet Commonly known as: SYNTHROID Take 1 tablet (100 mcg total) by mouth daily.   LORazepam 0.5 MG tablet Commonly known as: ATIVAN Take 0.5-1 mg by mouth daily as needed for anxiety.   meclizine 25 MG tablet Commonly known as: ANTIVERT Take 1 tablet (25 mg total) by mouth 3 (three) times daily as needed for dizziness.   metFORMIN 500 MG tablet Commonly known as: GLUCOPHAGE Take 1 tablet (500 mg total) by mouth 2 (two) times daily with a meal.   multivitamin with minerals Tabs tablet Take 1 tablet by mouth daily.   ondansetron 4 MG tablet Commonly known as: Zofran Take 1 tablet (4 mg total) by mouth every 8 (eight) hours as needed for nausea or vomiting.   ONE TOUCH ULTRA 2 w/Device Kit Use to test blood sugars 1-2 times daily.   OneTouch Ultra test strip Generic drug: glucose blood Use to test blood sugars 1-2 times daily.   onetouch ultrasoft lancets Use to test blood sugars 1-2 times daily.   pravastatin 20 MG tablet Commonly known as: PRAVACHOL Take 1 tablet by mouth once daily         ALLERGIES: Allergies  Allergen Reactions   Morphine And Related Nausea And Vomiting and Nausea Only   Ambien  [Zolpidem] Other (See Comments)    Per patient this caused her to fall   Duloxetine Swelling and Other (See Comments)    Other reaction(s): SWELLING/EDEMA   Latuda [Lurasidone Hcl]    Morphine Nausea Only   Shingrix [Zoster Vac Recomb Adjuvanted]     Dizziness vomiting     REVIEW OF SYSTEMS: A comprehensive ROS was conducted with the patient and is negative except as per HPI     OBJECTIVE:   VITAL SIGNS: BP 118/76 (BP Location: Left Arm, Patient Position: Sitting, Cuff Size: Large)   Pulse 92   Ht 5' (1.524 m)   Wt 151 lb (68.5 kg)   SpO2 97%   BMI 29.49 kg/m    PHYSICAL EXAM:  General: Pt appears well and is in NAD  Neck: General: Supple without adenopathy or carotid bruits. Thyroid: Thyroid size normal.  No goiter or nodules appreciated.   Lungs: Clear with good BS bilat with no rales, rhonchi, or wheezes  Heart: RRR   Abdomen: Normoactive bowel sounds, soft, nontender, without masses or organomegaly palpable  Extremities:  Lower extremities - No pretibial edema. No lesions.  Neuro: MS is good with appropriate affect, pt is alert and Ox3    DM foot exam: 10/10/2021  The skin of the feet is intact without sores or ulcerations. The pedal pulses are 2+ on right and 2+ on left. The sensation is intact to a screening 5.07, 10 gram monofilament bilaterally  DATA REVIEWED:  Lab Results  Component Value Date   HGBA1C 5.7 07/04/2022   HGBA1C 6.9 (H) 10/02/2021   HGBA1C 5.0 08/29/2020    Latest Reference Range & Units 08/19/22 08:46  Total CHOL/HDL Ratio  3  Cholesterol 0 - 200 mg/dL 133  HDL Cholesterol >39.00 mg/dL 41.60  Direct LDL mg/dL 64.0  MICROALB/CREAT RATIO 0.0 - 30.0 mg/g 0.6  NonHDL  90.95  Triglycerides 0.0 - 149.0 mg/dL 230.0 (H)  VLDL 0.0 - 40.0 mg/dL 46.0 (H)    Latest Reference Range & Units 08/19/22 08:46  TSH 0.35 - 5.50 uIU/mL 0.62      Thyroid Pathology 05/24/2021  FINAL  MICROSCOPIC DIAGNOSIS:   A. THYROID, TOTAL THYROIDECTOMY:  -   Noninvasive follicular thyroid neoplasm with papillary-like nuclear  features (NIFTP, 0.15 cm, left)  -  Follicular adenoma (5.1 cm, right)    ASSESSMENT / PLAN / RECOMMENDATIONS:   1) Type 2 Diabetes Mellitus, optimally controlled, With out complications - Most recent A1c of 6.9%. Goal A1c < 7.0 %.    - Pt endorses  chronic diarrhea that started prior to Metformin, she is not sure if metformin is having any contributing factor  - Will switch metformin to extended release and see how she does    MEDICATIONS: Continue metformin 500 mg twice daily  EDUCATION / INSTRUCTIONS: BG monitoring instructions: Patient is instructed to check her blood sugars 3 times a week  2) Diabetic complications:  Eye: Does not have known diabetic retinopathy.  Patient urged to have an eye exam Neuro/ Feet: Does not have known diabetic peripheral neuropathy. Renal: Patient does not have known baseline CKD. She is not on an ACEI/ARB at present.    3)Postoperative Hypothyroidism :  - TSH normal  - Pt educated extensively on the correct way to take levothyroxine (first thing in the morning with water, 30 minutes before eating or taking other medications). - Pt encouraged to double dose the following day if she were to miss a dose given long half-life of levothyroxine.  Medication  Continue levothyroxine 100 mcg daily  4) Mixed Dyslipidemia:  - LDL at goa but TG remains elevated  - Will encourage low fat diet    Medication  Pravastatin 20 mg daily      5) NIFTP: - Surgical resection is curative  - NO indication for RAI ablation nor TSH suppression     Follow-up in 4 months  Signed electronically by: Mack Guise, MD  [facility] Endocrinology  [facility] [address] [place], [place] [address] Phone: [phone] FAX: [phone]   CC: [name], [name], [place] [address] Phone: [phone]  Fax:  [phone]    Return to Endocrinology clinic as below: Future Appointments  Date Time Provider [place]  09/05/2022  9:00 AM [name], [name], MD [facility]  12/30/2022 11:15 AM [facility]-NURSE HEALTH ADVISOR [facility]  01/20/2023  8:30 AM Hazle Coca, PhD [facility] None  01/20/2023  9:30 AM [facility]- NEUROPSYCH TECH [facility] None  01/29/2023  2:30 PM Hazle Coca, PhD [facility] None  02/03/2023  1:30 PM Narda Amber K, DO [facility] None

## 2022-09-01 DIAGNOSIS — Z1211 Encounter for screening for malignant neoplasm of colon: Secondary | ICD-10-CM | POA: Diagnosis not present

## 2022-09-01 DIAGNOSIS — K5730 Diverticulosis of large intestine without perforation or abscess without bleeding: Secondary | ICD-10-CM | POA: Diagnosis not present

## 2022-09-01 DIAGNOSIS — R197 Diarrhea, unspecified: Secondary | ICD-10-CM | POA: Diagnosis not present

## 2022-09-01 DIAGNOSIS — K6389 Other specified diseases of intestine: Secondary | ICD-10-CM | POA: Diagnosis not present

## 2022-09-01 DIAGNOSIS — R194 Change in bowel habit: Secondary | ICD-10-CM | POA: Diagnosis not present

## 2022-09-01 LAB — HM COLONOSCOPY

## 2022-09-05 ENCOUNTER — Encounter: Payer: Medicare PPO | Admitting: Family Medicine

## 2022-10-01 NOTE — Progress Notes (Unsigned)
HPI: [name] is a 62 y.o. female with PMHx significant for post surgical hypothyroidism,gastroparesis, dementia,chronic fatigue,DM II, HLD, bipolar disorder,and unstable gait  here today with her wife for her routine physical.  Last CPE: 09/03/21 Reporting last appt with her gyn in 12/2021.   She sleeps an average of six to eight hours per day.  She is not exercising regularly. In general she does not follow a healthful diet, loves hamburgers. Her wife reports recent weight loss due to decreased appetite.   Immunization History  Administered Date(s) Administered   Influenza Split 07/18/2011   Influenza, Quadrivalent, Recombinant, Inj, Pf 05/19/2019   Influenza,inj,Quad PF,6+ Mos 07/02/2018, 08/06/2020   Influenza-Unspecified 06/06/2021   Moderna Sars-Covid-2 Vaccination 10/17/2019, 11/17/2019, 06/29/2020   Pfizer Covid-19 Vaccine Bivalent Booster 12yr & up 06/06/2021, 06/12/2022   Pneumococcal Polysaccharide-23 07/02/2018   Td 06/13/2009   Tdap 06/13/2009   Zoster Recombinat (Shingrix) 10/11/2021, 06/12/2022   Health Maintenance  Topic Date Due   FOOT EXAM  Never done   OPHTHALMOLOGY EXAM  Never done   COVID-19 Vaccine (6 - 2023-24 season) 10/19/2022 (Originally 08/07/2022)   INFLUENZA VACCINE  11/16/2022 (Originally 03/18/2022)   MAMMOGRAM  12/28/2022 (Originally 09/19/2019)   Medicare Annual Wellness (AWV)  12/28/2022   HEMOGLOBIN A1C  01/02/2023   Diabetic kidney evaluation - Urine ACR  08/20/2023   Diabetic kidney evaluation - eGFR measurement  10/04/2023   COLONOSCOPY (Pts 45-470yrInsurance coverage will need to be confirmed)  09/01/2032   Hepatitis C Screening  Completed   HIV Screening  Completed   Zoster Vaccines- Shingrix  Completed   HPV VACCINES  Aged Out   DTaP/Tdap/Td  Discontinued   PAP SMEAR-Modifier  Discontinued   DM II and hypothyroidism: Follows with endocrinologist. Lab Results  Component Value Date   HGBA1C 5.7 07/04/2022   HLD on  Pravastatin 20 mg daily. ALT mildly elevated intermittently, last one 46 in 03/2022.  Lab Results  Component Value Date   CHOL 133 08/19/2022   HDL 41.60 08/19/2022   LDLCALC 54 05/15/2017   LDLDIRECT 64.0 08/19/2022   TRIG 230.0 (H) 08/19/2022   CHOLHDL 3 08/19/2022   Since her last visit on 07/04/22, she has seen neurologist and recent diagnosed with dementia but no specific type identified yet. She reports that her memory issues are gradually getting worse. She has difficulty remembering dates and years.  Review of Systems  Constitutional:  Positive for fatigue. Negative for activity change, appetite change and fever.  HENT:  Negative for mouth sores and sore throat.   Eyes:  Negative for redness and visual disturbance.  Respiratory:  Negative for cough, shortness of breath and wheezing.   Cardiovascular:  Negative for chest pain and leg swelling.  Gastrointestinal:  Negative for abdominal pain, nausea and vomiting.  Endocrine: Negative for cold intolerance, heat intolerance, polydipsia, polyphagia and polyuria.  Genitourinary:  Negative for decreased urine volume, dysuria, hematuria, vaginal bleeding and vaginal discharge.  Musculoskeletal:  Negative for gait problem and myalgias.  Skin:  Negative for color change and rash.  Allergic/Immunologic: Positive for environmental allergies.  Neurological:  Negative for seizures, syncope, weakness and headaches.  Hematological:  Negative for adenopathy. Does not bruise/bleed easily.  Psychiatric/Behavioral:  Positive for sleep disturbance. Negative for hallucinations. The patient is nervous/anxious.   All other systems reviewed and are negative.  Current Outpatient Medications on File Prior to Visit  Medication Sig Dispense Refill   acetaminophen (TYLENOL) 325 MG tablet Take 650 mg by mouth every  6 (six) hours as needed for mild pain, fever or headache.     aspirin-acetaminophen-caffeine (EXCEDRIN MIGRAINE) 250-250-65 MG tablet Take 2  tablets by mouth every 6 (six) hours as needed for headache.     Blood Glucose Monitoring Suppl (ONE TOUCH ULTRA 2) w/Device KIT Use to test blood sugars 1-2 times daily. 1 kit 0   buPROPion (WELLBUTRIN XL) 150 MG 24 hr tablet Take 450 mg by mouth every morning.     calcium carbonate (TUMS) 500 MG chewable tablet Chew 2 tablets (400 mg of elemental calcium total) by mouth 3 (three) times daily. 90 tablet 1   divalproex (DEPAKOTE ER) 250 MG 24 hr tablet Take 750 mg by mouth at bedtime.     glucose blood (ONETOUCH ULTRA) test strip Use to test blood sugars 1-2 times daily. 200 each 3   Lancets (ONETOUCH ULTRASOFT) lancets Use to test blood sugars 1-2 times daily. 200 each 3   levothyroxine (SYNTHROID) 100 MCG tablet Take 1 tablet (100 mcg total) by mouth daily. 90 tablet 3   LORazepam (ATIVAN) 0.5 MG tablet Take 0.5-1 mg by mouth daily as needed for anxiety.     meclizine (ANTIVERT) 25 MG tablet Take 1 tablet (25 mg total) by mouth 3 (three) times daily as needed for dizziness. 30 tablet 0   metFORMIN (GLUCOPHAGE-XR) 500 MG 24 hr tablet Take 2 tablets (1,000 mg total) by mouth daily in the afternoon. 180 tablet 3   Multiple Vitamin (MULTIVITAMIN WITH MINERALS) TABS tablet Take 1 tablet by mouth daily.     ondansetron (ZOFRAN) 4 MG tablet Take 1 tablet (4 mg total) by mouth every 8 (eight) hours as needed for nausea or vomiting. 20 tablet 0   pravastatin (PRAVACHOL) 20 MG tablet Take 1 tablet by mouth once daily 90 tablet 2   albuterol (VENTOLIN HFA) 108 (90 Base) MCG/ACT inhaler Inhale 2 puffs into the lungs every 6 (six) hours as needed for wheezing or shortness of breath. 18 g 1   No current facility-administered medications on file prior to visit.   Past Medical History:  Diagnosis Date   Allergic rhinitis 03/26/2007   Anginal pain ([place]) 2001   Autonomic dysfunction 01/11/2015   Bipolar 1 disorder    Hx of psychosis; present since age 2   Chronic fatigue 06/21/2017   Community acquired  pneumonia of right lung [id number]   Complication of anesthesia    agitation   Costochondritis 02/14/2014   Diverticular disease of colon 08/06/2020   Double vision 03/15/2011   Dyspnea on effort 01/10/2011   Dysuria 02/15/2013   Elevated lactic acid level    Endometriosis 05/11/2008   Fatty liver disease, nonalcoholic [id number]   Frequency of urination    Gastroesophageal reflux disease 08/06/2020   Headache    History of duodenal ulcer    History of kidney stones    History of lithium toxicity 05/01/2014   Hyperglycemia 01/10/2011   Hyperlipidemia, mixed 05/12/2017   Hyperprolactinemia 06/26/2015   Insomnia 05/12/2017   Irritable bowel syndrome 08/06/2020   Jaw pain 02/13/2012   poss tmj area     Leukocytosis 05/01/2014   Multinodular goiter 05/12/2017   Nocturia    Nondiabetic gastroparesis 01/11/2015   Pelvic pain    Plantar fasciitis 06/11/2007   Pneumonia 08/20/2020   Pruritus ani 08/06/2020   Schizoaffective disorder, bipolar type    Unclear if this is not better accounted for by longstanding history of bipolar disorder   Social anxiety disorder  Subacute bronchitis 07/10/2018   Thyroid nodule 02/02/2017   Urgency of urination    Vitamin D deficiency 09/23/2011   Past Surgical History:  Procedure Laterality Date   CARDIAC CATHETERIZATION  10-09-1999  DR KATZ   NORMAL LVF/  NORMAL RCA/  NO CRITICAL DISEASE LEFT CORONARY SYSTEM   CARDIOVASCULAR STRESS TEST  01-23-2011   NORMAL NUCLEAR STUDY/ NO ISCHEMIA/ EF 81%   CYSTOSCOPY WITH BIOPSY N/A 06/03/2013   Procedure: CYSTOSCOPY WITH BIOPSY  INSTILLATION OF MARCAINE AND PYRIDIUM;  Surgeon: Hanley Ben, MD;  Location: [facility];  Service: Urology;  Laterality: N/A;   LAPAROSCOPIC CHOLECYSTECTOMY  11-08-2001   LAPAROSCOPIC LEFT SALPINGOOPHORECTOMY AND LYSYS ADHESIONS  03-10-2002   LAPAROSCOPIC REMOVAL OVARY REMNANT  2003   [place]   LAPAROSCOPY N/A 12/14/2012   Procedure: LAPAROSCOPY  DIAGNOSTIC;  Surgeon: Stark Klein, MD;  Location: [facility];  Service: General;  Laterality: N/A;   THYROIDECTOMY N/A 05/24/2021   Procedure: TOTAL THYROIDECTOMY;  Surgeon: Armandina Gemma, MD;  Location: [facility];  Service: General;  Laterality: N/A;   TOTAL ABDOMINAL HYSTERECTOMY  2000   W/ RIGHT SALPINGOOPHORECTOMY   TRANSTHORACIC ECHOCARDIOGRAM  10-13-2010   NORMAL LVF/  EF 55-60%    Allergies  Allergen Reactions   Morphine And Related Nausea And Vomiting and Nausea Only   Ambien [Zolpidem] Other (See Comments)    Per patient this caused her to fall   Duloxetine Swelling and Other (See Comments)    Other reaction(s): SWELLING/EDEMA   Latuda [Lurasidone Hcl]    Morphine Nausea Only   Shingrix [Zoster Vac Recomb Adjuvanted]     Dizziness vomiting    Family History  Problem Relation Age of Onset   Sleep apnea Father    Depression Father    Alcohol abuse Father    Hypertension Father    Migraines Sister        headaches   Anxiety disorder Sister    Other Mother        MAC infection   Anxiety disorder Mother    Dementia Mother        likely Alzheimer's disease; symptom onset on late 70s/early 80s   Heart disease Paternal Grandmother    Hyperlipidemia Paternal Grandmother    Alcohol abuse Paternal Grandfather    Diabetes Neg Hx     Social History   Socioeconomic History   Marital status: Married    Spouse name: Not on file   Number of children: 0   Years of education: 16   Highest education level: Bachelor's degree (e.g., BA, AB, BS)  Occupational History   Occupation: Disability    Comment: Nurse  Tobacco Use   Smoking status: Former    Packs/day: 0.01    Years: 1.00    Total pack years: 0.01    Types: Cigarettes    Quit date: 1980    Years since quitting: 44.1   Smokeless tobacco: Never   Tobacco comments:    ONLY SMOKED FOR 6 MONTHS --  QUIT  YRS AGO  Vaping Use   Vaping Use: Never used  Substance and Sexual Activity   Alcohol use: Yes    Comment: occ    Drug use: Never   Sexual activity: Not on file  Other Topics Concern   Not on file  Social History Narrative   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    no tobacco   Caffeine Use: very little, three times a week   Left handed    Social Determinants of Health   Financial Resource Strain: Low Risk  (12/27/2021)   Overall Financial Resource Strain (CARDIA)    Difficulty of Paying Living Expenses: Not hard at all  Food Insecurity: No Food Insecurity (12/27/2021)   Hunger Vital Sign    Worried About Running Out of Food in the Last Year: Never true    Ran Out of Food in the Last Year: Never true  Transportation Needs: No Transportation Needs (12/27/2021)   PRAPARE - Hydrologist (Medical): No    Lack of Transportation (Non-Medical): No  Physical Activity: Insufficiently Active (12/27/2021)   Exercise Vital Sign    Days of Exercise per Week: 3 days    Minutes of Exercise per Session: 20 min  Stress: No Stress Concern Present (12/27/2021)   [place]    Feeling of Stress : Not at all  Social Connections: Moderately Isolated (12/27/2021)   Social Connection and Isolation Panel [NHANES]    Frequency of Communication with Friends and Family: More than three times a week    Frequency of Social Gatherings with Friends and Family: More than three times a week    Attends Religious Services: Never    Marine scientist or Organizations: No    Attends Archivist Meetings: Never    Marital Status: Married   Vitals:   10/03/22 0744  BP: 118/74  Pulse: 88  Resp: 16  Temp: 97.9 F (36.6 C)  SpO2: 95%   Body mass index is 28.78 kg/m. Wt Readings from Last 3 Encounters:  10/03/22 147 lb 6 oz (66.8 kg)  08/19/22 151 lb (68.5 kg)  07/04/22 150 lb 2 oz (68.1 kg)    Physical Exam Vitals and nursing note reviewed.  Constitutional:      General: She is not in acute distress.    Appearance: She is well-developed.  HENT:     Head: Normocephalic and atraumatic.     Right Ear: Hearing, tympanic membrane, ear canal and external ear normal.     Left Ear: Hearing, tympanic membrane, ear canal and external ear normal.     Mouth/Throat:     Mouth: Mucous membranes are moist.     Pharynx: Oropharynx is clear. Uvula midline.  Eyes:     Extraocular Movements: Extraocular movements intact.     Conjunctiva/sclera: Conjunctivae normal.     Pupils: Pupils are equal, round, and reactive to light.  Neck:     Thyroid: No thyromegaly.     Trachea: No tracheal deviation.  Cardiovascular:     Rate and Rhythm: Normal rate and regular rhythm.     Pulses:          Dorsalis pedis pulses are 2+ on the right side and 2+ on the left side.     Heart sounds: No murmur heard. Pulmonary:     Effort: Pulmonary effort is normal. No respiratory distress.     Breath sounds: Normal breath sounds.  Abdominal:     Palpations: Abdomen is soft. There is no hepatomegaly or mass.     Tenderness: There is no abdominal tenderness.  Genitourinary:    Comments: Deferred to gyn. Musculoskeletal:     Comments: No signs of synovitis appreciated.  Lymphadenopathy:     Cervical: No cervical adenopathy.     Upper  Body:     Right upper body: No supraclavicular adenopathy.     Left upper body: No supraclavicular adenopathy.  Skin:    General: Skin is warm.     Findings: No erythema or rash.  Neurological:     General: No focal deficit present.     Mental Status: She is alert.     Cranial Nerves: No cranial nerve deficit.     Coordination: Coordination normal.     Gait: Gait normal.     Deep Tendon Reflexes:     Reflex Scores:      Bicep reflexes are 2+ on the right side and 2+ on the left side.      Patellar reflexes are 2+ on the right side and 2+ on the left side.    Comments:  She does not remember date. Oriented in person and place. Mildly unstable gait, not assisted.  Psychiatric:        Mood and Affect: Affect normal. Mood is anxious.   ASSESSMENT AND PLAN: Ms. TONGA DOMENICK was here today annual physical examination.  Orders Placed This Encounter  Procedures   Comprehensive metabolic panel   Lab Results  Component Value Date   CREATININE 0.70 10/03/2022   BUN 13 10/03/2022   NA 139 10/03/2022   K 4.3 10/03/2022   CL 105 10/03/2022   CO2 28 10/03/2022   Lab Results  Component Value Date   ALT 22 10/03/2022   AST 20 10/03/2022   ALKPHOS 69 10/03/2022   BILITOT 0.8 10/03/2022   Routine general medical examination at a health care facility Assessment & Plan: We discussed the importance of regular physical activity and healthy diet for prevention of chronic illness and/or complications. Preventive guidelines reviewed. Vaccination up to date. Continue her female preventive care with her gynecologist. Ca++ and vit D supplementation to continue. Next CPE in a year.   Dementia, unspecified dementia severity, unspecified dementia type, unspecified whether behavioral, psychotic, or mood disturbance or anxiety ([place]) Assessment & Plan: Following with neurologist. Reporting gradual worsening of memory, most likely Alzheimer's disease. Some of her psychiatric conditions can also be contributing factors to her memory problems. Not on pharmacologic treatment yet. Pending neuropsychologic evaluation.   Hyperlipidemia, mixed Assessment & Plan: LDL is at goal, 64 in 08/2022. Continue Pravastatin 20 mg daily.   Elevated ALT measurement -     Comprehensive metabolic panel; Future  Schizoaffective disorder, bipolar type [facility]) Assessment & Plan: Following with psychiatrist.   Return in 1 year (on 10/04/2023) for CPE.  Reshunda Strider G. [name], MD  [facility]. [place] office.

## 2022-10-03 ENCOUNTER — Ambulatory Visit (INDEPENDENT_AMBULATORY_CARE_PROVIDER_SITE_OTHER): Payer: Medicare PPO | Admitting: Family Medicine

## 2022-10-03 ENCOUNTER — Encounter: Payer: Self-pay | Admitting: Family Medicine

## 2022-10-03 VITALS — BP 118/74 | HR 88 | Temp 97.9°F | Resp 16 | Ht 60.0 in | Wt 147.4 lb

## 2022-10-03 DIAGNOSIS — E782 Mixed hyperlipidemia: Secondary | ICD-10-CM

## 2022-10-03 DIAGNOSIS — Z0000 Encounter for general adult medical examination without abnormal findings: Secondary | ICD-10-CM

## 2022-10-03 DIAGNOSIS — F250 Schizoaffective disorder, bipolar type: Secondary | ICD-10-CM

## 2022-10-03 DIAGNOSIS — F0390 Unspecified dementia without behavioral disturbance: Secondary | ICD-10-CM

## 2022-10-03 DIAGNOSIS — R7401 Elevation of levels of liver transaminase levels: Secondary | ICD-10-CM | POA: Diagnosis not present

## 2022-10-03 LAB — COMPREHENSIVE METABOLIC PANEL
ALT: 22 U/L (ref 0–35)
AST: 20 U/L (ref 0–37)
Albumin: 4.2 g/dL (ref 3.5–5.2)
Alkaline Phosphatase: 69 U/L (ref 39–117)
BUN: 13 mg/dL (ref 6–23)
CO2: 28 mEq/L (ref 19–32)
Calcium: 10.1 mg/dL (ref 8.4–10.5)
Chloride: 105 mEq/L (ref 96–112)
Creatinine, Ser: 0.7 mg/dL (ref 0.40–1.20)
GFR: 93.78 mL/min (ref >60.00)
Glucose, Bld: 87 mg/dL (ref 70–99)
Potassium: 4.3 mEq/L (ref 3.5–5.1)
Sodium: 139 mEq/L (ref 135–145)
Total Bilirubin: 0.8 mg/dL (ref 0.2–1.2)
Total Protein: 7 g/dL (ref 6.0–8.3)

## 2022-10-03 NOTE — Patient Instructions (Addendum)
A few things to remember from today's visit:  Routine general medical examination at a health care facility  Dementia, unspecified dementia severity, unspecified dementia type, unspecified whether behavioral, psychotic, or mood disturbance or anxiety (HCC)  Hyperlipidemia, mixed  Elevated ALT measurement - Plan: Comprehensive metabolic panel  If you need refills for medications you take chronically, please call your pharmacy. Do not use My Chart to request refills or for acute issues that need immediate attention. If you send a my chart message, it may take a few days to be addressed, specially if I am not in the office.  Please be sure medication list is accurate. If a new problem present, please set up appointment sooner than planned today.  Health Maintenance, Female Adopting a healthy lifestyle and getting preventive care are important in promoting health and wellness. Ask your health care provider about: The right schedule for you to have regular tests and exams. Things you can do on your own to prevent diseases and keep yourself healthy. What should I know about diet, weight, and exercise? Eat a healthy diet  Eat a diet that includes plenty of vegetables, fruits, low-fat dairy products, and lean protein. Do not eat a lot of foods that are high in solid fats, added sugars, or sodium. Maintain a healthy weight Body mass index (BMI) is used to identify weight problems. It estimates body fat based on height and weight. Your health care provider can help determine your BMI and help you achieve or maintain a healthy weight. Get regular exercise Get regular exercise. This is one of the most important things you can do for your health. Most adults should: Exercise for at least 150 minutes each week. The exercise should increase your heart rate and make you sweat (moderate-intensity exercise). Do strengthening exercises at least twice a week. This is in addition to the moderate-intensity  exercise. Spend less time sitting. Even light physical activity can be beneficial. Watch cholesterol and blood lipids Have your blood tested for lipids and cholesterol at 61 years of age, then have this test every 5 years. Have your cholesterol levels checked more often if: Your lipid or cholesterol levels are high. You are older than 61 years of age. You are at high risk for heart disease. What should I know about cancer screening? Depending on your health history and family history, you may need to have cancer screening at various ages. This may include screening for: Breast cancer. Cervical cancer. Colorectal cancer. Skin cancer. Lung cancer. What should I know about heart disease, diabetes, and high blood pressure? Blood pressure and heart disease High blood pressure causes heart disease and increases the risk of stroke. This is more likely to develop in people who have high blood pressure readings or are overweight. Have your blood pressure checked: Every 3-5 years if you are 4-70 years of age. Every year if you are 67 years old or older. Diabetes Have regular diabetes screenings. This checks your fasting blood sugar level. Have the screening done: Once every three years after age 20 if you are at a normal weight and have a low risk for diabetes. More often and at a younger age if you are overweight or have a high risk for diabetes. What should I know about preventing infection? Hepatitis B If you have a higher risk for hepatitis B, you should be screened for this virus. Talk with your health care provider to find out if you are at risk for hepatitis B infection. Hepatitis C Testing  is recommended for: Everyone born from [birth year] through 1964. Anyone with known risk factors for hepatitis C. Sexually transmitted infections (STIs) Get screened for STIs, including gonorrhea and chlamydia, if: You are sexually active and are younger than 61 years of age. You are older than 61 years  of age and your health care provider tells you that you are at risk for this type of infection. Your sexual activity has changed since you were last screened, and you are at increased risk for chlamydia or gonorrhea. Ask your health care provider if you are at risk. Ask your health care provider about whether you are at high risk for HIV. Your health care provider may recommend a prescription medicine to help prevent HIV infection. If you choose to take medicine to prevent HIV, you should first get tested for HIV. You should then be tested every 3 months for as long as you are taking the medicine. Pregnancy If you are about to stop having your period (premenopausal) and you may become pregnant, seek counseling before you get pregnant. Take 400 to 800 micrograms (mcg) of folic acid every day if you become pregnant. Ask for birth control (contraception) if you want to prevent pregnancy. Osteoporosis and menopause Osteoporosis is a disease in which the bones lose minerals and strength with aging. This can result in bone fractures. If you are 88 years old or older, or if you are at risk for osteoporosis and fractures, ask your health care provider if you should: Be screened for bone loss. Take a calcium or vitamin D supplement to lower your risk of fractures. Be given hormone replacement therapy (HRT) to treat symptoms of menopause. Follow these instructions at home: Alcohol use Do not drink alcohol if: Your health care provider tells you not to drink. You are pregnant, may be pregnant, or are planning to become pregnant. If you drink alcohol: Limit how much you have to: 0-1 drink a day. Know how much alcohol is in your drink. In the [place] one drink equals one 12 oz bottle of beer (355 mL), one 5 oz glass of wine (148 mL), or one 1 oz glass of hard liquor (44 mL). Lifestyle Do not use any products that contain nicotine or tobacco. These products include cigarettes, chewing tobacco, and vaping  devices, such as e-cigarettes. If you need help quitting, ask your health care provider. Do not use street drugs. Do not share needles. Ask your health care provider for help if you need support or information about quitting drugs. General instructions Schedule regular health, dental, and eye exams. Stay current with your vaccines. Tell your health care provider if: You often feel depressed. You have ever been abused or do not feel safe at home. Summary Adopting a healthy lifestyle and getting preventive care are important in promoting health and wellness. Follow your health care provider's instructions about healthy diet, exercising, and getting tested or screened for diseases. Follow your health care provider's instructions on monitoring your cholesterol and blood pressure. This information is not intended to replace advice given to you by your health care provider. Make sure you discuss any questions you have with your health care provider. Document Revised: 12/24/2020 Document Reviewed: 12/24/2020 Elsevier Patient Education  [place].

## 2022-10-04 NOTE — Assessment & Plan Note (Signed)
Following with neurologist. Reporting gradual worsening of memory, most likely Alzheimer's disease. Some of her psychiatric conditions can also be contributing factors to her memory problems. Not on pharmacologic treatment yet. Pending neuropsychologic evaluation.

## 2022-10-04 NOTE — Assessment & Plan Note (Signed)
We discussed the importance of regular physical activity and healthy diet for prevention of chronic illness and/or complications. Preventive guidelines reviewed. Vaccination up to date. Continue her female preventive care with her gynecologist. Ca++ and vit D supplementation to continue. Next CPE in a year.

## 2022-10-04 NOTE — Assessment & Plan Note (Signed)
LDL is at goal, 64 in 08/2022. Continue Pravastatin 20 mg daily.

## 2022-10-04 NOTE — Assessment & Plan Note (Signed)
Following with psychiatrist. 

## 2022-10-22 DIAGNOSIS — F3131 Bipolar disorder, current episode depressed, mild: Secondary | ICD-10-CM | POA: Diagnosis not present

## 2022-11-19 DIAGNOSIS — F3131 Bipolar disorder, current episode depressed, mild: Secondary | ICD-10-CM | POA: Diagnosis not present

## 2022-12-03 ENCOUNTER — Other Ambulatory Visit: Payer: Self-pay | Admitting: Family Medicine

## 2022-12-08 ENCOUNTER — Telehealth: Payer: Self-pay | Admitting: Family Medicine

## 2022-12-08 DIAGNOSIS — F29 Unspecified psychosis not due to a substance or known physiological condition: Secondary | ICD-10-CM | POA: Diagnosis not present

## 2022-12-08 MED ORDER — ALCOHOL PREP PADS: MEDICATED_PAD | 3 refills | Status: DC

## 2022-12-08 MED ORDER — ACCU-CHEK GUIDE VI STRP: ORAL_STRIP | 2 refills | Status: DC

## 2022-12-08 NOTE — Telephone Encounter (Signed)
Rx's sent in. °

## 2022-12-08 NOTE — Telephone Encounter (Signed)
Prescription Request  12/08/2022  LOV: 10/03/2022  What is the name of the medication or equipment? Alcohol pads and glucose blood (ACCU-CHEK GUIDE) test strip   Have you contacted your pharmacy to request a refill? No   Which pharmacy would you like this sent to?  [facility] [address], [place] - [address] [place] [address] Phone: [phone] Fax: [phone]    Patient notified that their request is being sent to the clinical staff for review and that they should receive a response within 2 business days.   Please advise at Mobile [phone] (mobile)

## 2022-12-17 DIAGNOSIS — F332 Major depressive disorder, recurrent severe without psychotic features: Secondary | ICD-10-CM | POA: Diagnosis not present

## 2022-12-22 ENCOUNTER — Telehealth: Payer: Self-pay | Admitting: Family Medicine

## 2022-12-22 NOTE — Telephone Encounter (Signed)
Contacted [name] to schedule their annual wellness visit. Appointment made for 12/30/22.  [name] AWV direct phone # [phone]   Due to schedule change moved AWV appt to [employer] schedule

## 2022-12-29 ENCOUNTER — Encounter: Payer: Self-pay | Admitting: Internal Medicine

## 2022-12-29 ENCOUNTER — Encounter: Payer: Self-pay | Admitting: Psychology

## 2022-12-29 ENCOUNTER — Ambulatory Visit: Payer: Medicare PPO | Admitting: Internal Medicine

## 2022-12-29 VITALS — BP 124/82 | HR 79 | Ht 60.0 in | Wt 152.0 lb

## 2022-12-29 DIAGNOSIS — E1142 Type 2 diabetes mellitus with diabetic polyneuropathy: Secondary | ICD-10-CM | POA: Diagnosis not present

## 2022-12-29 DIAGNOSIS — G63 Polyneuropathy in diseases classified elsewhere: Secondary | ICD-10-CM

## 2022-12-29 DIAGNOSIS — Z7984 Long term (current) use of oral hypoglycemic drugs: Secondary | ICD-10-CM | POA: Diagnosis not present

## 2022-12-29 DIAGNOSIS — E890 Postprocedural hypothyroidism: Secondary | ICD-10-CM | POA: Diagnosis not present

## 2022-12-29 DIAGNOSIS — E1169 Type 2 diabetes mellitus with other specified complication: Secondary | ICD-10-CM | POA: Diagnosis not present

## 2022-12-29 LAB — POCT GLYCOSYLATED HEMOGLOBIN (HGB A1C): Hemoglobin A1C: 5.8 % — AB (ref 4.0–5.6)

## 2022-12-29 MED ORDER — METFORMIN HCL ER 500 MG PO TB24: 500.0000 mg | ORAL_TABLET | Freq: Every day | ORAL | 3 refills | Status: DC

## 2022-12-29 MED ORDER — LEVOTHYROXINE SODIUM 100 MCG PO TABS: 100.0000 ug | ORAL_TABLET | Freq: Every day | ORAL | 3 refills | Status: DC

## 2022-12-29 MED ORDER — METFORMIN HCL ER 500 MG PO TB24: 1000.0000 mg | ORAL_TABLET | Freq: Every day | ORAL | 3 refills | Status: DC

## 2022-12-29 NOTE — Progress Notes (Signed)
Name: [name]  MRN/ DOB: [id number], 1960-12-17   Age/ Sex: 62 y.o., female    PCP: [place], [name], MD   Reason for Endocrinology Evaluation: Type 2 Diabetes Mellitus     Date of Initial Endocrinology Visit: 10/10/2021    PATIENT IDENTIFIER: [name] is a 62 y.o. female with a past medical history of T2DM, dyslipidemia hypothyroidism. The patient presented for initial endocrinology clinic visit on 10/10/2021 for consultative assistance with her diabetes management.    HPI: [name]    Diagnosed with DM 09/2021              Hemoglobin A1c has ranged from 5.0% in 2022, peaking at 6.9% in 2023.  THYROID HISTORY :  She is S/P total thyroidectomy due to enlarging MNG with pathology report consistent with NIFTP on 05/24/2021  Father with thyroid cancer   On disability for Bipolar d/o    SUBJECTIVE:   During the last visit (08/19/2022): A1c 6.9%     Today (12/29/22): [name] is here for follow-up on diabetes management of thyroid disease.  She is accompanied by her spouse Danford Bad.  She checks blood sugars occasionally . The patient has not had hypoglycemic episodes since the last clinic visit   Has been exercising and changing her diet  Denies nausea or vomiting , continues with chronic diarrhea , which was there prior to starting metformin Denies palpitations  Denies local neck swelling  Denies tremors    HOME ENDOCRINE REGIMEN: Metformin 500 mg BID  Levothyroxine 100 mcg daily    Statin: yes ACE-I/ARB: no Prior Diabetic Education: 03/27/2022   METER DOWNLOAD SUMMARY:  85- 132  mg/dL      DIABETIC COMPLICATIONS: Microvascular complications:   Denies: CKD, neuropathy  Last eye exam: Completed a while ago   Macrovascular complications:   Denies: CAD, PVD, CVA   PAST HISTORY: Past Medical History:  Past Medical History:  Diagnosis Date   Allergic rhinitis 03/26/2007   Anginal pain (HCC) 2001   Autonomic dysfunction  01/11/2015   Bipolar 1 disorder    Hx of psychosis; present since age 50   Chronic fatigue 06/21/2017   Community acquired pneumonia of right lung 08/17/2020   Complication of anesthesia    agitation   Costochondritis 02/14/2014   Diverticular disease of colon 08/06/2020   Double vision 03/15/2011   Dyspnea on effort 01/10/2011   Dysuria 02/15/2013   Elevated lactic acid level    Endometriosis 05/11/2008   Fatty liver disease, nonalcoholic 02/13/2014   Frequency of urination    Gastroesophageal reflux disease 08/06/2020   Headache    History of duodenal ulcer    History of kidney stones    History of lithium toxicity 05/01/2014   Hyperglycemia 01/10/2011   Hyperlipidemia, mixed 05/12/2017   Hyperprolactinemia 06/26/2015   Insomnia 05/12/2017   Irritable bowel syndrome 08/06/2020   Jaw pain 02/13/2012   poss tmj area     Leukocytosis 05/01/2014   Multinodular goiter 05/12/2017   Nocturia    Nondiabetic gastroparesis 01/11/2015   Pelvic pain    Plantar fasciitis 06/11/2007   Pneumonia 08/20/2020   Pruritus ani 08/06/2020   Schizoaffective disorder, bipolar type    Unclear if this is not better accounted for by longstanding history of bipolar disorder   Social anxiety disorder    Subacute bronchitis 07/10/2018   Thyroid nodule 02/02/2017   Urgency of urination    Vitamin D deficiency 09/23/2011   Past Surgical History:  Past Surgical History:  Procedure Laterality Date   CARDIAC CATHETERIZATION  10-09-1999  DR KATZ   NORMAL LVF/  NORMAL RCA/  NO CRITICAL DISEASE LEFT CORONARY SYSTEM   CARDIOVASCULAR STRESS TEST  01-23-2011   NORMAL NUCLEAR STUDY/ NO ISCHEMIA/ EF 81%   CYSTOSCOPY WITH BIOPSY N/A 06/03/2013   Procedure: CYSTOSCOPY WITH BIOPSY  INSTILLATION OF MARCAINE AND PYRIDIUM;  Surgeon: Lindaann Slough, MD;  Location: [facility];  Service: Urology;  Laterality: N/A;   LAPAROSCOPIC CHOLECYSTECTOMY  11-08-2001   LAPAROSCOPIC LEFT SALPINGOOPHORECTOMY  AND LYSYS ADHESIONS  03-10-2002   LAPAROSCOPIC REMOVAL OVARY REMNANT  2003   [place]   LAPAROSCOPY N/A 12/14/2012   Procedure: LAPAROSCOPY DIAGNOSTIC;  Surgeon: Almond Lint, MD;  Location: [facility];  Service: General;  Laterality: N/A;   THYROIDECTOMY N/A 05/24/2021   Procedure: TOTAL THYROIDECTOMY;  Surgeon: Darnell Level, MD;  Location: [facility];  Service: General;  Laterality: N/A;   TOTAL ABDOMINAL HYSTERECTOMY  2000   W/ RIGHT SALPINGOOPHORECTOMY   TRANSTHORACIC ECHOCARDIOGRAM  10-13-2010   NORMAL LVF/  EF 55-60%    Social History:  reports that she quit smoking about 44 years ago. Her smoking use included cigarettes. She has a 0.01 pack-year smoking history. She has never used smokeless tobacco. She reports current alcohol use. She reports that she does not use drugs. Family History:  Family History  Problem Relation Age of Onset   Sleep apnea Father    Depression Father    Alcohol abuse Father    Hypertension Father    Migraines Sister        headaches   Anxiety disorder Sister    Other Mother        MAC infection   Anxiety disorder Mother    Dementia Mother        likely Alzheimer's disease; symptom onset on late 70s/early 80s   Heart disease Paternal Grandmother    Hyperlipidemia Paternal Grandmother    Alcohol abuse Paternal Grandfather    Diabetes Neg Hx      HOME MEDICATIONS: Allergies as of 12/29/2022       Reactions   Morphine And Related Nausea And Vomiting, Nausea Only   Ambien [zolpidem] Other (See Comments)   Per patient this caused her to fall   Duloxetine Swelling, Other (See Comments)   Other reaction(s): SWELLING/EDEMA   Latuda [lurasidone Hcl]    Morphine Nausea Only   Shingrix [zoster Vac Recomb Adjuvanted]    Dizziness vomiting        Medication List        Accurate as of Dec 29, 2022 10:12 AM. If you have any questions, ask your nurse or doctor.          Accu-Chek Guide test strip Generic drug: glucose blood Use to test blood  sugars 1-2 times daily.   Accu-Chek Softclix Lancets lancets USE TO CHECK BLOOD SUGARS 1-2 TIMES DAILY   acetaminophen 325 MG tablet Commonly known as: TYLENOL Take 650 mg by mouth every 6 (six) hours as needed for mild pain, fever or headache.   albuterol 108 (90 Base) MCG/ACT inhaler Commonly known as: VENTOLIN HFA Inhale 2 puffs into the lungs every 6 (six) hours as needed for wheezing or shortness of breath.   Alcohol Prep Pads To use for blood sugar checks.   aspirin-acetaminophen-caffeine 250-250-65 MG tablet Commonly known as: EXCEDRIN MIGRAINE Take 2 tablets by mouth every 6 (six) hours as needed for headache.   buPROPion 150 MG 24 hr  tablet Commonly known as: WELLBUTRIN XL Take 450 mg by mouth every morning.   calcium carbonate 500 MG chewable tablet Commonly known as: Tums Chew 2 tablets (400 mg of elemental calcium total) by mouth 3 (three) times daily.   divalproex 250 MG 24 hr tablet Commonly known as: DEPAKOTE ER Take 750 mg by mouth at bedtime.   levothyroxine 100 MCG tablet Commonly known as: SYNTHROID Take 1 tablet (100 mcg total) by mouth daily.   LORazepam 0.5 MG tablet Commonly known as: ATIVAN Take 0.5-1 mg by mouth daily as needed for anxiety.   meclizine 25 MG tablet Commonly known as: ANTIVERT Take 1 tablet (25 mg total) by mouth 3 (three) times daily as needed for dizziness.   metFORMIN 500 MG 24 hr tablet Commonly known as: GLUCOPHAGE-XR Take 2 tablets (1,000 mg total) by mouth daily with breakfast. What changed: when to take this Changed by: Scarlette Shorts, MD   multivitamin with minerals Tabs tablet Take 1 tablet by mouth daily.   ondansetron 4 MG tablet Commonly known as: Zofran Take 1 tablet (4 mg total) by mouth every 8 (eight) hours as needed for nausea or vomiting.   ONE TOUCH ULTRA 2 w/Device Kit Use to test blood sugars 1-2 times daily.   pravastatin 20 MG tablet Commonly known as: PRAVACHOL Take 1 tablet by mouth  once daily         ALLERGIES: Allergies  Allergen Reactions   Morphine And Related Nausea And Vomiting and Nausea Only   Ambien [Zolpidem] Other (See Comments)    Per patient this caused her to fall   Duloxetine Swelling and Other (See Comments)    Other reaction(s): SWELLING/EDEMA   Latuda [Lurasidone Hcl]    Morphine Nausea Only   Shingrix [Zoster Vac Recomb Adjuvanted]     Dizziness vomiting     REVIEW OF SYSTEMS: A comprehensive ROS was conducted with the patient and is negative except as per HPI     OBJECTIVE:   VITAL SIGNS: BP 124/82 (BP Location: Right Arm, Patient Position: Sitting, Cuff Size: Small)   Pulse 79   Ht 5' (1.524 m)   Wt 152 lb (68.9 kg)   SpO2 97%   BMI 29.69 kg/m    PHYSICAL EXAM:  General: Pt appears well and is in NAD  Neck: General: Supple without adenopathy or carotid bruits. Thyroid:.  No goiter or nodules appreciated.   Lungs: Clear with good BS bilat   Heart: RRR   Extremities:  Lower extremities - No pretibial edema  Neuro: MS is good with appropriate affect, pt is alert and Ox3    DM foot exam: 12/29/2022  The skin of the feet is intact without sores or ulcerations. The pedal pulses are undetectable  The sensation is absent  to a screening 5.07, 10 gram monofilament bilaterally  DATA REVIEWED:  Lab Results  Component Value Date   HGBA1C 5.8 (A) 12/29/2022   HGBA1C 5.7 07/04/2022   HGBA1C 6.9 (H) 10/02/2021    Latest Reference Range & Units 08/19/22 08:46  Total CHOL/HDL Ratio  3  Cholesterol 0 - 200 mg/dL 161  HDL Cholesterol >09.60 mg/dL 45.40  Direct LDL mg/dL 98.1  MICROALB/CREAT RATIO 0.0 - 30.0 mg/g 0.6  NonHDL  90.95  Triglycerides 0.0 - 149.0 mg/dL 191.4 (H)  VLDL 0.0 - 78.2 mg/dL 95.6 (H)    Latest Reference Range & Units 08/19/22 08:46  TSH 0.35 - 5.50 uIU/mL 0.62      Thyroid Pathology 05/24/2021  FINAL MICROSCOPIC DIAGNOSIS:   A. THYROID, TOTAL THYROIDECTOMY:  -  Noninvasive follicular thyroid  neoplasm with papillary-like nuclear  features (NIFTP, 0.15 cm, left)  -  Follicular adenoma (5.1 cm, right)    ASSESSMENT / PLAN / RECOMMENDATIONS:   1) Type 2 Diabetes Mellitus, optimally controlled, With out complications - Most recent A1c of 5.8%. Goal A1c < 7.0 %.     -I have praised the patient on continued improvement in glucose control - Pt endorses  chronic diarrhea that started prior to Metformin - Her A1c 5.8% and she would like to reduce the dose, we have room to do so     MEDICATIONS: Decrease  metformin 500 mg XR daily  EDUCATION / INSTRUCTIONS: BG monitoring instructions: Patient is instructed to check her blood sugars 3 times a week  2) Diabetic complications:  Eye: Does not have known diabetic retinopathy.  Patient urged to have an eye exam Neuro/ Feet: Does not have known diabetic peripheral neuropathy. Renal: Patient does not have known baseline CKD. She is not on an ACEI/ARB at present.    3)Postoperative Hypothyroidism :  - TSH normal  - Pt educated extensively on the correct way to take levothyroxine (first thing in the morning with water, 30 minutes before eating or taking other medications). - Pt encouraged to double dose the following day if she were to miss a dose given long half-life of levothyroxine.  Medication  Continue levothyroxine 100 mcg daily  4) Peripheral Neuropathy  -Patient with evidence of onychomycosis of the great toenails, as well as absent sensation to monofilament testing -  A referral has been placed to podiatry   5) NIFTP: - Surgical resection is curative  - NO indication for RAI ablation nor TSH suppression     Follow-up in 6 months  Signed electronically by: Lyndle Herrlich, MD  [facility] Endocrinology  [facility] [address] [place] [place], [place] [address] Phone: [phone] FAX: [phone]   CC: [place], [name], MD [address] [place] [address] Phone:  [phone]  Fax: [phone]    Return to Endocrinology clinic as below: Future Appointments  Date Time Provider Department Center  12/30/2022 11:20 AM Terressa Koyanagi, DO [facility]  01/20/2023  8:30 AM Rosann Auerbach, PhD [facility] None  01/20/2023  9:30 AM [facility] None  01/29/2023  2:30 PM Rosann Auerbach, PhD [facility] None  02/03/2023  1:30 PM Nita Sickle K, DO [facility] None

## 2022-12-29 NOTE — Patient Instructions (Signed)
Decrease Metformin 500 mg, 1 tablet daily

## 2022-12-30 ENCOUNTER — Encounter: Payer: Self-pay | Admitting: Psychology

## 2022-12-30 ENCOUNTER — Ambulatory Visit: Payer: Medicare PPO

## 2022-12-30 ENCOUNTER — Ambulatory Visit: Payer: Medicare PPO | Admitting: Psychology

## 2022-12-30 ENCOUNTER — Encounter: Payer: Medicare PPO | Admitting: Family Medicine

## 2022-12-30 DIAGNOSIS — G3184 Mild cognitive impairment, so stated: Secondary | ICD-10-CM

## 2022-12-30 DIAGNOSIS — T56891S Toxic effect of other metals, accidental (unintentional), sequela: Secondary | ICD-10-CM | POA: Diagnosis not present

## 2022-12-30 DIAGNOSIS — F319 Bipolar disorder, unspecified: Secondary | ICD-10-CM

## 2022-12-30 DIAGNOSIS — R4189 Other symptoms and signs involving cognitive functions and awareness: Secondary | ICD-10-CM

## 2022-12-30 NOTE — Progress Notes (Signed)
   Psychometrician Note   Cognitive testing was administered to [name] by [name], B.S. (psychometrist) under the supervision of Dr. Newman Nickels, Ph.D., licensed psychologist on 12/30/2022. [name] did not appear overtly distressed by the testing session per behavioral observation or responses across self-report questionnaires. Rest breaks were offered.    The battery of tests administered was selected by Dr. Newman Nickels, Ph.D. with consideration to [name]'s current level of functioning, the nature of her symptoms, emotional and behavioral responses during interview, level of literacy, observed level of motivation/effort, and the nature of the referral question. This battery was communicated to the psychometrist. Communication between Dr. Newman Nickels, Ph.D. and the psychometrist was ongoing throughout the evaluation and Dr. Newman Nickels, Ph.D. was immediately accessible at all times. Dr. Newman Nickels, Ph.D. provided supervision to the psychometrist on the date of this service to the extent necessary to assure the quality of all services provided.    [name] will return within approximately 1-2 weeks for an interactive feedback session with Dr. Milbert Coulter at which time her test performances, clinical impressions, and treatment recommendations will be reviewed in detail. [name] understands she can contact our office should she require our assistance before this time.  A total of 140 minutes of billable time were spent face-to-face with [name] by the psychometrist. This includes both test administration and scoring time. Billing for these services is reflected in the clinical report generated by Dr. Newman Nickels, Ph.D.  This note reflects time spent with the psychometrician and does not include test scores or any clinical interpretations made by Dr. Milbert Coulter. The full report will follow in a separate note.

## 2022-12-30 NOTE — Progress Notes (Signed)
NEUROPSYCHOLOGICAL EVALUATION [place]. [facility] Department of Neurology  Date of Evaluation: Dec 30, 2022  Reason for Referral:   [name] is a 62 y.o. left-handed Caucasian female referred by Nita Sickle, D.O., to characterize her current cognitive functioning and assist with diagnostic clarity and treatment planning in the context of subjective cognitive and functional decline and numerous psychiatric and medical comorbidities.   Assessment and Plan:   Clinical Impression(s): [name] pattern of performance is suggestive of an isolated impairment across phonemic fluency. Writing samples were additionally poor, as was performance across a receptive language task. Performance variability was exhibited across processing speed, executive functioning, semantic fluency, and visuospatial abilities. While she had trouble learning and later recalling a list of words, performances across story and figure-based memory tasks were consistently strong. Performances were also appropriate relative to age-matched peers across attention/concentration and confrontation naming.    Direct test by test comparisons to her previous March 2022 evaluation are inappropriate due to performance validity concerns surrounding the previous evaluation. When looking broadly, there would appear evidence for improvement in several areas rather than any evidence for objective decline. Improvement may simply reflect testing validity concerns during previous testing versus there being no validity concerns during current testing.   Functionally, [name]'s wife has had to fully take over medication management, noting at least one prior instance where poison control was contacted due to [name] taking too much of a medication after forgetting that she had already taken her medications. Bills have had to be placed on auto-draft and [name] no longer drives due to concerns held by  her wife. While functional concerns are admittedly worrisome for a major neurocognitive disorder ("dementia") designation, I continue to have some skepticism surrounding a true neurological cause. Additionally, testing patterns are not severe enough in my opinion to warrant a dementia diagnosis. As such, I feel that she continues to best meet diagnostic criteria for Mild Cognitive Impairment. She would be towards the moderate to severe end of this spectrum given functional concerns.   The underlying etiology for cognitive and functional decline remains unclear and is likely multifactorial in nature. Across mood-related questionnaires, [name] described moderate symptoms of both anxiety and depression. Historically, she has longstanding bipolar disorder (with a prior hospitalization for lithium toxicity), as well as a history of social anxiety. Medically, recent neuroimaging suggested mild to moderate microvascular ischemic disease. She also described notable chronic sleep dysfunction and medical records suggest further concern surrounding polypharmacy (i.e., medication side effects). Taken together, all these variables, especially in combination, could very reasonably create the patterns of strength and weakness [name] exhibited across testing.  Neurologically speaking, despite some variability across memory tasks, current testing is not suggestive of early-onset Alzheimer's disease. Her young age of 79 would already make this sort of presentation exceptionally rare. Prominent language dysfunction could raise concerns for a primary progressive aphasia (PPA) presentation. However, neuroimaging was unremarkable in this regard and her clinical presentation does not align well with any PPA subtype at the present time. Both Drs. Patel and Rice did not suggest any parkinsonian features across her most recent neurological exams. While her pattern of variability/weakness could be reasonably seen in this  family of conditions, it would appear more likely to be caused by the factors listed above without any prominent parkinsonian behavioral features being present. There is also not compelling evidence for frontotemporal lobar degeneration at the present time given the lack of prominent personality changes. Continued  medical monitoring will be important moving forward.   Recommendations: If further work-up is desired, [name] could discuss the pros and cons of an FDG-PET scan with Dr. Allena Katz. This could provide additional information surrounding potential atypical metabolic patterns worrisome for the early beginnings of a neurological process. A DaTscan or alpha-synuclein skin biopsy could be considered if there are more advanced concerns surrounding an atypical parkinsonian presentation.   Dr. Gregary Cromer recommendation surrounding a thorough autoimmune work-up is also reasonable. She could discuss the pros and cons of this with Dr. Allena Katz as well.   Following improvement in psychiatric symptoms, polypharmacy, and sleep concerns, a repeat neuropsychological evaluation could be considered to assess for residual deficits if desired.   A combination of medication and psychotherapy has been shown to be most effective at treating symptoms of anxiety and depression. As such, [name] is encouraged to speak with her prescribing physician regarding medication adjustments to optimally manage these symptoms.    Likewise, [name] is encouraged to consider engaging in short-term psychotherapy to address symptoms of psychiatric distress. She would benefit from an active and collaborative therapeutic environment, rather than one purely supportive in nature. Recommended treatment modalities include Cognitive Behavioral Therapy (CBT) or Acceptance and Commitment Therapy (ACT).  Performance across neurocognitive testing is not a strong predictor of an individual's safety operating a motor vehicle. Should her  family wish to pursue a formalized driving evaluation, they could reach out to the following agencies: [employer] in [place]: [phone] [facility]: [phone] [facility]: [phone] [facility]: [phone] or [phone]  Should there be progression of current deficits over time, [name] is unlikely to regain any independent living skills lost. Therefore, it is recommended that she remain as involved as possible in all aspects of household chores, finances, and medication management, with supervision to ensure adequate performance. she will likely benefit from the establishment and maintenance of a routine in order to maximize her functional abilities over time.  It will be important for [name] to have another person with her when in situations where she may need to process information, weigh the pros and cons of different options, and make decisions, in order to ensure that she fully understands and recalls all information to be considered.  [name] is encouraged to attend to lifestyle factors for brain health (e.g., regular physical exercise, good nutrition habits and consideration of the MIND-DASH diet, regular participation in cognitively-stimulating activities, and general stress management techniques), which are likely to have benefits for both emotional adjustment and cognition. In fact, in addition to promoting good general health, regular exercise incorporating aerobic activities (e.g., brisk walking, jogging, cycling, etc.) has been demonstrated to be a very effective treatment for depression and stress, with similar efficacy rates to both antidepressant medication and psychotherapy. Optimal control of vascular risk factors (including safe cardiovascular exercise and adherence to dietary recommendations) is encouraged. Continued participation in activities which provide mental stimulation and social interaction is  also recommended.   Memory can be improved using internal strategies such as rehearsal, repetition, chunking, mnemonics, association, and imagery. External strategies such as written notes in a consistently used memory journal, visual and nonverbal auditory cues such as a calendar on the refrigerator or appointments with alarm, such as on a cell phone, can also help maximize recall.  Because she shows better recall for structured information, she will likely understand and retain new information better if it is presented to her in a meaningful or well-organized manner  at the outset, such as grouping items into meaningful categories or presenting information in an outlined, bulleted, or story format.  To address problems with processing speed, she may wish to consider:   -Ensuring that she is alerted when essential material or instructions are being presented   -Adjusting the speed at which new information is presented   -Allowing for more time in comprehending, processing, and responding in conversation   -Repeating and paraphrasing instructions or conversations aloud  To address problems with fluctuating attention and/or executive dysfunction, she may wish to consider:   -Avoiding external distractions when needing to concentrate   -Limiting exposure to fast paced environments with multiple sensory demands   -Writing down complicated information and using checklists   -Attempting and completing one task at a time (i.e., no multi-tasking)   -Verbalizing aloud each step of a task to maintain focus   -Taking frequent breaks during the completion of steps/tasks to avoid fatigue   -Reducing the amount of information considered at one time   -Scheduling more difficult activities for a time of day where she is usually most alert  Review of Records:   [name] was seen in the ED on 04/12/2014 after being found by EMS on the side of the road. She was said to initially not answer questions or  follow commands. She reportedly had been working with her psychiatrist to reduce her lithium due to symptoms of brain fog. Initially, she described her mind racing, feelings that the television was making reference to her, and that there is an outside force which controls her. She denied hallucinations but reported delusions surrounding the ability to sense things and having various premonitions and intuitions. She further reported the ability to read people's minds. She was admitted to the adult psychiatric unit and responded well to medications. She was ultimately diagnosed with schizoaffective disorder, bipolar subtype. However, there is also reference to a longstanding history of bipolar disorder which predated this hospitalization. A discharge summary was written on 04/22/2014, stating that she was discharged home. However, she was seen in the ED the next day stating that she had been urinating on herself, was unable to void, and was convinced that she has a urethral mass. Reports suggest she was again discharged home on 04/24/2014. She was again seen in the ED on 04/30/2014 for urinary frequency and urgency. Her UA was unremarkable with no signs of urinary retention and she was discharged to [facility] on 05/02/2014. She responded well and was discharged on 05/16/2014. 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 Her mother reported that Ms. Febres had been up all night pacing the floor and would not sleep. I was unable to locate a discharge summary regarding this final visit. Her next ED visit was a year later on 06/18/2015 due to increasing symptoms of depression, thoughts surrounding overdosing on Ambien due to various financial stressors, and fears of becoming homeless. She was again admitted, responded well to treatment, and was eventually discharged home on  06/27/2015.     She was seen by [facility] Nita Sickle, D.O.) on 09/05/2019 with new complaints of jaw tightness. Starting around summer 2020, she began having a sensation that her left jaw gets stuck. This occurs when she is chewing. She denied jaw pain, clicking, ear pain, or facial numbness/tingling. She also reported having spells of slurred speech  which are worse when she is fatigued. She has a history of chronic fatigue and has been extensively evaluated by neurology; all assessments have been negative for a primary neurological explanation of her symptoms.    Ms. Dunsmoor was seen by [facility] Evelena Peat, M.D.) on 08/06/2020 for sore throat symptoms. She reported 3 to 4 weeks sore throat symptoms, predominantly left-sided. She had recently been prescribed amoxicillin empirically to cover for strep but that did not make any difference. She had tried multiple over-the-counter things including Tylenol, ibuprofen, Chloraseptic spray; all provided minimal relief. She was not aware of any adenopathy. Fever, chills, and cough were denied. She reported frequently biting her tongue and thinks she may have some malalignment issues with her TMJ joint.   She was briefly seen in the ED on 08/17/2020 for worsening cough, shortness of breath, and malaise. In the ED, she was found febrile satting in the 90s. Head and C-spine CTs were negative for acute findings. Abdomen/pelvis CT was said to catch the lower right lower lobe consolidation. She was ultimately diagnosed with community acquired pneumonia. Testing for COVID-19 was negative. She followed up with family medicine (Betty Swaziland, M.D.) on 08/29/2020 for ED-related difficulties.   She completed a comprehensive neuropsychological evaluation with myself on 10/16/2020. Performances across validity testing were variable, limiting interpretation viability as obtained scores likely underestimated true cognitive abilities. Broadly speaking,  weaknesses were seen across executive functioning, verbal fluency, visuospatial abilities, and encoding/retrieval aspects of memory. Given validity concerns, the etiology for dysfunction was unclear. Potential causes included longstanding and significant psychiatric conditions, numerous medical ailments (including prior lithium toxicity), and concerns for polypharmacy.   She was seen by Dr. Allena Katz for follow-up on 04/23/2022. At that time, she described worsening balance, noting that she had fallen three times during the past year (tending to fall forwards). She reported ongoing difficulties with dizziness and vertigo, which also impact balance. Personality changes were noted in that Ms. Hagmann seems more easily agitated and fatigued. She also described progressive short-term memory dysfunction. Ultimately, Ms. Archambault was referred for a repeat neuropsychological evaluation to characterize her cognitive abilities and to assist with diagnostic clarity and treatment planning.  Ms. Woodhull was also seen by [facility] Inis Sizer, M.D.) on 07/16/2022 for a second opinion. Dr. Dimple Casey expressed a desire for Ms. Pilling to complete a full autonomic work-up. Concerns were expressed for a dementia presentation given report of progressive functional decline. However, the underlying etiology remained unclear. During her neurological exam, Dr. Dimple Casey noted that Ms. Magaw did not have clear focal deficits on exam, did not have evidence of parkinsonism, or any other clear etiology to explain balance problems. Repeat cognitive testing was also recommended.   NCS/EMG on 09/21/2013 of the right arm and leg were normal. No evidence of a peripheral neuropathy was seen. Repetitive nerve stimulation on the right hand was done at 3 Hz stimulation, and again at 40 Hz stimulation without evidence of a neuromuscular transmission disorder. Repetitive nerve stimulation studies were completely normal. Brain MRI  on 09/23/2014 revealed tiny bilateral subcortical and periventricular nonspecific white matter hyperintensities. No enhancing lesions were noted. Brain MRI on 11/08/2020 revealed mild to moderate microvascular ischemic disease and generalized volume loss. Brain MRI on 04/22/2022 was stable.   Past Medical History:  Diagnosis Date   Abnormal LFTs 04/06/2013   Very minor tried to reassure okay to repeat today with hepatitis C screen   Allergic rhinitis 03/26/2007   Autonomic dysfunction 01/11/2015  Bipolar 1 disorder    Hx of psychosis; present since age 62   Chest pain on breathing 03/16/2014   Chronic fatigue 06/21/2017   Chronic interstitial cystitis 11/15/2020   Community acquired pneumonia of right lung 08/17/2020   Complication of anesthesia    agitation   Constipation 05/17/2012   Costochondritis 02/14/2014   Diverticular disease of colon 08/06/2020   Double vision 03/15/2011   Dyspnea on effort 01/10/2011   Dysuria 02/15/2013   Early satiety 04/27/2013   Elevated lactic acid level    Endometriosis 05/11/2008   Fatty liver disease, nonalcoholic 02/13/2014   Fecal urgency 08/06/2020   Female stress incontinence 11/15/2020   Food aversion 11/08/2012   Frequency of urination    Gastroesophageal reflux disease 08/06/2020   Headache    History of duodenal ulcer    History of hysterectomy 11/15/2020   History of kidney stones    History of lithium toxicity 05/01/2014   Hoarseness of voice 02/27/2016   Hyperglycemia 01/10/2011   Hyperlipidemia, mixed 05/12/2017   Hyperprolactinemia 06/26/2015   Insomnia 05/12/2017   Irritable bowel syndrome 08/06/2020   Jaw pain 02/13/2012   poss tmj area     Left upper quadrant pain 08/06/2020   Leukocytosis 05/01/2014   Lump of breast, left 12/20/2012   about 6 oclock    Mass of urethra 11/15/2020   Mild cognitive impairment of uncertain or unknown etiology 10/16/2020   Multinodular goiter 05/12/2017   Nocturia    Nondiabetic  gastroparesis 01/11/2015   Noninvasive follicular neoplasm of thyroid with papillary-like nuclear features 10/10/2021   Palpitations 12/08/2010   Pelvic pain    Periumbilical pain 08/06/2020   Plantar fasciitis 06/11/2007   Postsurgical hypothyroidism 09/03/2021   Pruritus ani 08/06/2020   Right ear pain 02/16/2012   Social anxiety disorder    Subacute bronchitis 07/10/2018   Type II diabetes mellitus 10/10/2021   Unstable gait 07/05/2022   Urgency of urination    Vitamin B12 deficiency 09/03/2021   Vitamin D deficiency 09/23/2011    Past Surgical History:  Procedure Laterality Date   CARDIAC CATHETERIZATION  10-09-1999  DR KATZ   NORMAL LVF/  NORMAL RCA/  NO CRITICAL DISEASE LEFT CORONARY SYSTEM   CARDIOVASCULAR STRESS TEST  01-23-2011   NORMAL NUCLEAR STUDY/ NO ISCHEMIA/ EF 81%   CYSTOSCOPY WITH BIOPSY N/A 06/03/2013   Procedure: CYSTOSCOPY WITH BIOPSY  INSTILLATION OF MARCAINE AND PYRIDIUM;  Surgeon: Lindaann Slough, MD;  Location: [facility];  Service: Urology;  Laterality: N/A;   LAPAROSCOPIC CHOLECYSTECTOMY  11-08-2001   LAPAROSCOPIC LEFT SALPINGOOPHORECTOMY AND LYSYS ADHESIONS  03-10-2002   LAPAROSCOPIC REMOVAL OVARY REMNANT  2003   [place]   LAPAROSCOPY N/A 12/14/2012   Procedure: LAPAROSCOPY DIAGNOSTIC;  Surgeon: Almond Lint, MD;  Location: [facility];  Service: General;  Laterality: N/A;   THYROIDECTOMY N/A 05/24/2021   Procedure: TOTAL THYROIDECTOMY;  Surgeon: Darnell Level, MD;  Location: [facility];  Service: General;  Laterality: N/A;   TOTAL ABDOMINAL HYSTERECTOMY  2000   W/ RIGHT SALPINGOOPHORECTOMY   TRANSTHORACIC ECHOCARDIOGRAM  10-13-2010   NORMAL LVF/  EF 55-60%    Current Outpatient Medications:    Accu-Chek Softclix Lancets lancets, USE TO CHECK BLOOD SUGARS 1-2 TIMES DAILY, Disp: 200 each, Rfl: 2   acetaminophen (TYLENOL) 325 MG tablet, Take 650 mg by mouth every 6 (six) hours as needed for mild pain, fever or headache., Disp: , Rfl:     albuterol (VENTOLIN HFA) 108 (90 Base) MCG/ACT inhaler, Inhale  2 puffs into the lungs every 6 (six) hours as needed for wheezing or shortness of breath., Disp: 18 g, Rfl: 1   Alcohol Swabs (ALCOHOL PREP) PADS, To use for blood sugar checks., Disp: 100 each, Rfl: 3   aspirin-acetaminophen-caffeine (EXCEDRIN MIGRAINE) 250-250-65 MG tablet, Take 2 tablets by mouth every 6 (six) hours as needed for headache., Disp: , Rfl:    Blood Glucose Monitoring Suppl (ONE TOUCH ULTRA 2) w/Device KIT, Use to test blood sugars 1-2 times daily., Disp: 1 kit, Rfl: 0   buPROPion (WELLBUTRIN XL) 150 MG 24 hr tablet, Take 450 mg by mouth every morning., Disp: , Rfl:    calcium carbonate (TUMS) 500 MG chewable tablet, Chew 2 tablets (400 mg of elemental calcium total) by mouth 3 (three) times daily., Disp: 90 tablet, Rfl: 1   divalproex (DEPAKOTE ER) 250 MG 24 hr tablet, Take 750 mg by mouth at bedtime., Disp: , Rfl:    glucose blood (ACCU-CHEK GUIDE) test strip, Use to test blood sugars 1-2 times daily., Disp: 200 each, Rfl: 2   levothyroxine (SYNTHROID) 100 MCG tablet, Take 1 tablet (100 mcg total) by mouth daily., Disp: 90 tablet, Rfl: 3   LORazepam (ATIVAN) 0.5 MG tablet, Take 0.5-1 mg by mouth daily as needed for anxiety., Disp: , Rfl:    meclizine (ANTIVERT) 25 MG tablet, Take 1 tablet (25 mg total) by mouth 3 (three) times daily as needed for dizziness., Disp: 30 tablet, Rfl: 0   metFORMIN (GLUCOPHAGE-XR) 500 MG 24 hr tablet, Take 1 tablet (500 mg total) by mouth daily with breakfast., Disp: 90 tablet, Rfl: 3   Multiple Vitamin (MULTIVITAMIN WITH MINERALS) TABS tablet, Take 1 tablet by mouth daily., Disp: , Rfl:    ondansetron (ZOFRAN) 4 MG tablet, Take 1 tablet (4 mg total) by mouth every 8 (eight) hours as needed for nausea or vomiting., Disp: 20 tablet, Rfl: 0   pravastatin (PRAVACHOL) 20 MG tablet, Take 1 tablet by mouth once daily, Disp: 90 tablet, Rfl: 2  Clinical Interview:   The following information was  obtained during a clinical interview with [name] and her wife Lorene Dy prior to cognitive testing.  Cognitive Symptoms: Decreased short-term memory: Endorsed. She previously reported generalized short-term memory dysfunction with a particular area of difficulty surrounding trouble recalling words or the names of individuals. When asked currently, she reported trouble recalling the details of previous conversations and misplacing things around her residence. Lorene Dy previously noted that Ms. Brayboy will often forget conversations minutes after they have been completed. Difficulties were said to have persisted and progressively worsened since her previous evaluation in March 2022.  Decreased long-term memory: Denied. Decreased attention/concentration: Endorsed. Previously, deficits were largely attributed to issues with sustained attention. However, Ms. Kendal also reported increased distractibility during the current interview. [name] added that Ms. Boruff will start tasks, get distracted, and then forget about initial tasks she was attempting to complete.  Reduced processing speed: Endorsed. She previously described her processing as "very slow" and wondered if medication side effects could be playing a role. This has persisted over time.  Difficulties with executive functions: Endorsed. She previously reported trouble with organization and complex planning. Lorene Dy added that she has trouble remembering the steps when she attempts to make or complete multi-step processes. This has persisted over time. Trouble with impulsivity was denied. While severe personality changes were denied, Lorene Dy did add that Ms. Cudd seems more easily agitated and/or irritated lately.  Difficulties with emotion regulation: Denied. Difficulties with receptive  language: Denied. Difficulties with word finding: Endorsed. [name] added that Ms. Encalade has also has a far harder time writing and  composing sentences lately. Decreased visuoperceptual ability: Denied.   Trajectory of deficits: Previously, Lorene Dy stated that cognitive changes were first noticeable following Ms. Tirpak's psychiatric hospitalization in late 2015. There were concerns for lithium toxicity surrounding that visit, as well as bipolar disorder versus schizoaffective disorder. Since that time, cognitive dysfunction has remained largely present and was said to have gradually worsened. This worsening was has continued relative to her previous March 2022 evaluation.     Difficulties completing ADLs: Endorsed. All bills have been placed on auto-draft due to concerns of them going unpaid. Lorene Dy has had to fully take over medication management since the previous evaluation. She described frequent instances where Ms. Clift would have trouble recalling if she took medications. On at least one occasion, Ms. Kimes took more medication than prescribed to the extent that poison control had to be contacted. Ms. Mew does not drive due to cognitive concerns.  Additional Medical History: History of traumatic brain injury/concussion: Endorsed. She was seen in the ED on 06/15/2020 stating that five days previously, she fell and hit her forehead while walking her dog. She reported persistent moderate to severe frontal headaches, along with dizziness and occasional vomiting. There was also report of a second fall two days prior to being seen in the ED. Head CT on 06/15/2020 was negative. Despite her report of numerous falls over the years, she did not describe any more recent head injuries.  History of stroke: Denied. History of seizure activity: Denied. History of known exposure to toxins: Denied. Symptoms of chronic pain: Denied. Experience of frequent headaches/migraines: Denied.  Frequent instances of dizziness/vertigo: Endorsed. She previously reported ongoing dizziness which was said to have worsened over the past  few years. Symptoms were said to impact balance instability. She wondered if this could be related to medication side effects. Symptoms have persisted over time.    Sensory changes: She reported being able to see reasonably well but does have a floater and occasional blurred vision. Other sensory changes/difficulties (e.g., hearing, taste, smell) were denied.  Balance/coordination difficulties: Endorsed. She previously described her balance as "awful," stating that it has been gradually worsening over the past three years. She reported feeling weakness and instability in both her legs with one side not being worse than the other. She reported a history of several falls, generally falling forward. Lorene Dy added that she feels that Ms. Balik's center of gravity is off. Symptoms have persisted over time, with Ms. Yielding feeling as though her balance as progressively worsened.  Other motor difficulties: Denied.   Sleep History: Estimated hours obtained each night: Unclear. She does not appear able to follow a typical sleep schedule, making a numerical estimation difficult to produce.  Difficulties falling asleep: Endorsed. Symptoms of insomnia were previously attributed to significant anxiety, including racing and intrusive thoughts. Symptoms have persisted over time.  Difficulties staying asleep: Endorsed. She previously reported waking throughout the night either to use the restroom or for other unknown reasons. Symptoms have persisted over time. Feels rested and refreshed upon awakening: Variably so. She reported sometimes waking feeling tired and lethargic.    History of snoring: Denied. History of waking up gasping for air: Denied. Witnessed breath cessation while asleep: Denied.   History of vivid dreaming: Denied. Excessive movement while asleep: Denied. Instances of acting out her dreams: Denied.  Psychiatric/Behavioral Health History: Depression: Endorsed. She was first diagnosed  with bipolar disorder I when she was 62 years old. Over the years, she has had several depressive and manic episodes, as well as numerous psychiatric hospitalizations. Her most recent bout of hospitalizations in late 2015 is described above. She described her current mood as "fine." However, she did report her feeling that anti-depressant medications have been ineffective lately. She and Lorene Dy noted that she was scheduled to start a new medication at the advice of her psychiatrist tomorrow. Current suicidal ideation, intent, or plan was denied.  Anxiety: Endorsed. Lorene Dy previously noted that anxiety symptoms are generally social in nature. These are longstanding and have persisted to present day.  Mania: Endorsed (see above).  Trauma History: Denied. Visual/auditory hallucinations: Denied outside of manic/depressive episodes relating back to her history of bipolar disorder. Delusional thoughts: Denied outside of manic/depressive episodes relating back to her history of bipolar disorder.   Tobacco: Denied. Alcohol: She reported infrequent alcohol consumption and denied a history of problematic alcohol abuse or dependence.  Recreational drugs: Denied.  Family History: Problem Relation Age of Onset   Sleep apnea Father    Depression Father    Alcohol abuse Father    Hypertension Father    Migraines Sister        headaches   Anxiety disorder Sister    Other Mother        MAC infection   Anxiety disorder Mother    Dementia Mother        likely Alzheimer's disease; symptom onset on late 70s/early 80s   Heart disease Paternal Grandmother    Hyperlipidemia Paternal Grandmother    Alcohol abuse Paternal Grandfather    Diabetes Neg Hx    This information was confirmed by Ms. Sjogren.  Academic/Vocational History: Highest level of educational attainment: 16 years. She graduated from high school and earned [employer] in both nursing and criminal justice. She described herself as  an average to above average (A/B) student in academic settings. Math was noted as a likely relative weakness.  History of developmental delay: Denied. History of grade repetition: Denied. Enrollment in special education courses: Denied. History of LD/ADHD: Denied.   Employment: She currently receives disability benefits due to her psychiatric history. Prior to this, she worked as a Engineer, civil (consulting).   Evaluation Results:   Behavioral Observations: [name] was accompanied by her wife Lorene Dy, arrived to her appointment on time, and was appropriately dressed and groomed. She appeared alert. Observed gait and station were within normal limits. Gross motor functioning appeared intact upon informal observation and no abnormal movements (e.g., tremors) were noted. Her affect was generally relaxed and positive during interview. Spontaneous speech was fluent and word finding difficulties were not observed during the clinical interview. Thought processes were coherent, organized, and normal in content. Insight into her cognitive difficulties appeared adequate.   During testing, sustained attention was appropriate. Task engagement was adequate and she persisted when challenged. Overall, Ms. Wool was cooperative with the clinical interview and subsequent testing procedures.   Adequacy of Effort: The validity of neuropsychological testing is limited by the extent to which the individual being tested may be assumed to have exerted adequate effort during testing. [name] expressed her intention to perform to the best of her abilities and exhibited adequate task engagement and persistence. Scores across stand-alone and embedded performance validity measures were within expectation. As such, the results of the current evaluation are believed to be a valid representation of Ms. Tupy's current cognitive functioning.  Test Results: Ms. Deperalta was largely  oriented at the time of the current evaluation.  She was one day off when stating the current date, one day off when stating the current day of the week, and was unable to provide the full name of the current clinic.   Intellectual abilities based upon educational and vocational attainment were estimated to be in the average range. Premorbid abilities were estimated to be within the average range based upon a single-word reading test.   Processing speed was variable, ranging from the well below average to average normative ranges. Basic attention was below average to average. More complex attention (e.g., working memory) was average. Executive functioning was variable, ranging from the well below average to average normative ranges.  Assessed receptive language abilities were well below average. However, Ms. Gaulke only missed 4 total points on this task surrounding trouble understanding more complex sentence structure. She did not exhibit any difficulties comprehending task instructions and answered all questions asked of her appropriately during interview. It may be that this score represents more of a normative impairment rather than a functional impairment. Assessed expressive language was variable. Phonemic fluency was exceptionally low to well below average, semantic fluency was exceptionally low to average, and confrontation naming was average. Writing samples were initially unremarkable and appropriate. However, they unexpectedly devolved into unintelligible words with unexpected language content (e.g., "Greadis is vely giveen...(sic)").  Assessed visuospatial/visuoconstructional abilities were variable, ranging from the well below average to above average normative ranges.    Learning (i.e., encoding) of novel information was well below average across a list learning task but average when learning story-based content. Spontaneous delayed recall (i.e., retrieval) of previously learned information was commensurate with performances across  learning trials. Retention rates were 91% across a story learning task, 17% (raw score of 1) across a list learning task, and 74% across a figure drawing task. Performance across recognition tasks was well below average across a list learning task but average across both story and figure tasks, suggesting some evidence for information consolidation.   Results of emotional screening instruments suggested that recent symptoms of generalized anxiety were in the moderate range, while symptoms of depression were also within the moderate range. A screening instrument assessing recent sleep quality suggested the presence of moderate sleep dysfunction.  Tables of Scores:   Note: This summary of test scores accompanies the interpretive report and should not be considered in isolation without reference to the appropriate sections in the text. Descriptors are based on appropriate normative data and may be adjusted based on clinical judgment. Terms such as "Within Normal Limits" and "Outside Normal Limits" are used when a more specific description of the test score cannot be determined.       Percentile - Normative Descriptor > 98 - Exceptionally High 91-97 - Well Above Average 75-90 - Above Average 25-74 - Average 9-24 - Below Average 2-8 - Well Below Average < 2 - Exceptionally Low       Validity:   DESCRIPTOR       TOMM: --- --- Within Normal Limits  ACS Word Choice: --- --- Within Normal Limits  Dot Counting Test: --- --- Within Normal Limits  RBANS Effort Index: --- --- Within Normal Limits       Orientation:      Raw Score Percentile   NAB Orientation, Form 1 26/29 --- ---       Cognitive Screening:      Raw Score Percentile   SLUMS: 20/30 --- ---  RBANS, Form A: Standard Score/ Scaled Score Percentile   Total Score 84 14 Below Average  Immediate Memory 85 16 Below Average    List Learning 4 2 Well Below Average    Story Memory 11 63 Average  Visuospatial/Constructional 109 73  Average    Figure Copy 11 63 Average    Line Orientation 18/20 51-75 Average  Language 82 12 Below Average    Picture Naming 10/10 51-75 Average    Semantic Fluency 3 1 Exceptionally Low  Attention 85 16 Below Average    Digit Span 10 50 Average    Coding 5 5 Well Below Average  Delayed Memory 81 10 Below Average    List Recall 1/10 3-9 Well Below Average    List Recognition 17/20 3-9 Well Below Average    Story Recall 11 63 Average    Story Recognition 12/12 69+ Average    Figure Recall 11 63 Average    Figure Recognition 7/8 53-69 Average       Intellectual Functioning:      Standard Score Percentile   Test of Premorbid Functioning: 94 34 Average       Attention/Executive Function:     Trail Making Test (TMT): Raw Score (T Score) Percentile     Part A 40 secs.,  0 errors (40) 16 Below Average    Part B 122 secs.,  0 errors (36) 8 Well Below Average        NAB Attention Module, Form 1: T Score Percentile     Digits Forward 40 16 Below Average    Digits Backwards 54 66 Average       D-KEFS Color-Word Interference Test: Raw Score (Scaled Score) Percentile     Color Naming 42 secs. (6) 9 Below Average    Word Reading 25 secs. (10) 50 Average    Inhibition 87 secs. (7) 16 Below Average      Total Errors 4 errors (8) 25 Average    Inhibition/Switching 89 secs. (8) 25 Average      Total Errors 6 errors (7) 16 Below Average       D-KEFS Verbal Fluency Test: Raw Score (Scaled Score) Percentile     Letter Total Correct 16 (4) 2 Well Below Average    Category Total Correct 37 (11) 63 Average    Category Switching Total Correct 9 (6) 9 Below Average    Category Switching Accuracy 7 (6) 9 Below Average      Total Set Loss Errors 0 (13) 84 Above Average      Total Repetition Errors 2 (11) 63 Average       Language:     Verbal Fluency Test: Raw Score (T Score) Percentile     Phonemic Fluency (FAS) 16 (24) <1 Exceptionally Low    Animal Fluency 22 (51) 54 Average        NAB  Language Module, Form 1: T Score Percentile     Auditory Comprehension 31 3 Well Below Average    Naming 31/31 (55) 69 Average    Writing 19 <1 Exceptionally Low       Visuospatial/Visuoconstruction:      Raw Score Percentile   Clock Drawing: 8/10 --- Within Normal Limits       NAB Spatial Module, Form 1: T Score Percentile     Figure Drawing Copy 64 92 Well Above Average        Standard Score/ Scaled Score Percentile   WAIS-IV Perceptual Reasoning Index: 77 6 Well Below  Average    Block Design 7 16 Below Average    Matrix Reasoning 5 5 Well Below Average    Visual Puzzles 6 9 Below Average       Mood and Personality:      Raw Score Percentile   Beck Depression Inventory - II: 22 --- Moderate  PROMIS Anxiety Questionnaire: 26 --- Moderate       Additional Questionnaires:      Raw Score Percentile   PROMIS Sleep Disturbance Questionnaire: 34 --- Moderate   Informed Consent and Coding/Compliance:   The current evaluation represents a clinical evaluation for the purposes previously outlined by the referral source and is in no way reflective of a forensic evaluation.   Ms. Bertolet was provided with a verbal description of the nature and purpose of the present neuropsychological evaluation. Also reviewed were the foreseeable risks and/or discomforts and benefits of the procedure, limits of confidentiality, and mandatory reporting requirements of this provider. The patient was given the opportunity to ask questions and receive answers about the evaluation. Oral consent to participate was provided by the patient.   This evaluation was conducted by Newman Nickels, Ph.D., ABPP-CN, board certified clinical neuropsychologist. Ms. Dhami completed a clinical interview with Dr. Milbert Coulter, billed as one unit [phone], and 140 minutes of cognitive testing and scoring, billed as one unit [phone] and four additional units [id number]. Psychometrist Shan Levans, B.S., assisted Dr. Milbert Coulter with test  administration and scoring procedures. As a separate and discrete service, one unit [id number] and two units [phone] were billed for Dr. Tammy Sours time spent in interpretation and report writing.

## 2023-01-09 ENCOUNTER — Ambulatory Visit: Payer: Medicare PPO | Admitting: Psychology

## 2023-01-09 DIAGNOSIS — G3184 Mild cognitive impairment, so stated: Secondary | ICD-10-CM | POA: Diagnosis not present

## 2023-01-09 DIAGNOSIS — T56891S Toxic effect of other metals, accidental (unintentional), sequela: Secondary | ICD-10-CM

## 2023-01-09 DIAGNOSIS — F319 Bipolar disorder, unspecified: Secondary | ICD-10-CM

## 2023-01-09 NOTE — Progress Notes (Signed)
   Neuropsychology Feedback Session [name]. [facility] [place] Department of Neurology  Reason for Referral:   [name] is a 62 y.o. left-handed Caucasian female referred by Nita Sickle, D.O., to characterize her current cognitive functioning and assist with diagnostic clarity and treatment planning in the context of subjective cognitive and functional decline and numerous psychiatric and medical comorbidities.   Feedback:   [name] completed a comprehensive neuropsychological evaluation on 12/30/2022. Please refer to that encounter for the full report and recommendations. Briefly, results suggested an isolated impairment across phonemic fluency. Writing samples were additionally poor, as was performance across a receptive language task. Performance variability was exhibited across processing speed, executive functioning, semantic fluency, and visuospatial abilities. While she had trouble learning and later recalling a list of words, performances across story and figure-based memory tasks were consistently strong. When looking broadly relative to her prior evaluation, there would appear evidence for improvement in several areas rather than any evidence for objective decline. The underlying etiology for cognitive and functional decline remains unclear and is likely multifactorial in nature. Across mood-related questionnaires, [name] described moderate symptoms of both anxiety and depression. Historically, she has longstanding bipolar disorder (with a prior hospitalization for lithium toxicity), as well as a history of social anxiety. Medically, recent neuroimaging suggested mild to moderate microvascular ischemic disease. She also described notable chronic sleep dysfunction and medical records suggest further concern surrounding polypharmacy (i.e., medication side effects). Taken together, all these variables, especially in combination, could very reasonably create the  patterns of strength and weakness [name] exhibited across testing. Neurologically speaking, despite some variability across memory tasks, current testing is not suggestive of early-onset Alzheimer's disease.  [name] was accompanied by her wife during the current feedback session. Content of the current session focused on the results of her neuropsychological evaluation. [name] was given the opportunity to ask questions and her questions were answered. She was encouraged to reach out should additional questions arise. A copy of her report was provided at the conclusion of the visit.      One unit [phone] was billed for Dr. Tammy Sours time spent preparing for, conducting, and documenting the current feedback session with [name].

## 2023-01-14 ENCOUNTER — Ambulatory Visit (INDEPENDENT_AMBULATORY_CARE_PROVIDER_SITE_OTHER): Payer: Medicare PPO

## 2023-01-14 VITALS — Ht 61.0 in | Wt 152.0 lb

## 2023-01-14 DIAGNOSIS — Z0000 Encounter for general adult medical examination without abnormal findings: Secondary | ICD-10-CM | POA: Diagnosis not present

## 2023-01-14 NOTE — Patient Instructions (Addendum)
[name] , Thank you for taking time to come for your Medicare Wellness Visit. I appreciate your ongoing commitment to your health goals. Please review the following plan we discussed and let me know if I can assist you in the future.   These are the goals we discussed:  Goals       DIET - REDUCE CALORIE INTAKE      1800 cal/day.      Increase physical activity (pt-stated)      Get healthy.      Patient Stated (pt-stated)      Lose weight and get in shape.         This is a list of the screening recommended for you and due dates:  Health Maintenance  Topic Date Due   Eye exam for diabetics  01/14/2023*   COVID-19 Vaccine (6 - 2023-24 season) 01/30/2023*   Mammogram  01/14/2024*   Flu Shot  03/19/2023   Hemoglobin A1C  07/01/2023   Yearly kidney health urinalysis for diabetes  08/20/2023   Yearly kidney function blood test for diabetes  10/04/2023   Complete foot exam   12/29/2023   Medicare Annual Wellness Visit  01/14/2024   Colon Cancer Screening  09/01/2032   Hepatitis C Screening  Completed   HIV Screening  Completed   Zoster (Shingles) Vaccine  Completed   HPV Vaccine  Aged Out   DTaP/Tdap/Td vaccine  Discontinued   Pap Smear  Discontinued  *Topic was postponed. The date shown is not the original due date.    Advanced directives: Advance directive discussed with you today. Even though you declined this today, please call our office should you change your mind, and we can give you the proper paperwork for you to fill out.   Conditions/risks identified: None  Next appointment: Follow up in one year for your annual wellness visit.    Preventive Care 40-64 Years, Female Preventive care refers to lifestyle choices and visits with your health care provider that can promote health and wellness. What does preventive care include? A yearly physical exam. This is also called an annual well check. Dental exams once or twice a year. Routine eye exams. Ask your health  care provider how often you should have your eyes checked. Personal lifestyle choices, including: Daily care of your teeth and gums. Regular physical activity. Eating a healthy diet. Avoiding tobacco and drug use. Limiting alcohol use. Practicing safe sex. Taking low-dose aspirin daily starting at age 23. Taking vitamin and mineral supplements as recommended by your health care provider. What happens during an annual well check? The services and screenings done by your health care provider during your annual well check will depend on your age, overall health, lifestyle risk factors, and family history of disease. Counseling  Your health care provider may ask you questions about your: Alcohol use. Tobacco use. Drug use. Emotional well-being. Home and relationship well-being. Sexual activity. Eating habits. Work and work [employer]. Method of birth control. Menstrual cycle. Pregnancy history. Screening  You may have the following tests or measurements: Height, weight, and BMI. Blood pressure. Lipid and cholesterol levels. These may be checked every 5 years, or more frequently if you are over 67 years old. Skin check. Lung cancer screening. You may have this screening every year starting at age 40 if you have a 30-pack-year history of smoking and currently smoke or have quit within the past 15 years. Fecal occult blood test (FOBT) of the stool. You may have this test  every year starting at age 44. Flexible sigmoidoscopy or colonoscopy. You may have a sigmoidoscopy every 5 years or a colonoscopy every 10 years starting at age 79. Hepatitis C blood test. Hepatitis B blood test. Sexually transmitted disease (STD) testing. Diabetes screening. This is done by checking your blood sugar (glucose) after you have not eaten for a while (fasting). You may have this done every 1-3 years. Mammogram. This may be done every 1-2 years. Talk to your health care provider about when you should start  having regular mammograms. This may depend on whether you have a family history of breast cancer. BRCA-related cancer screening. This may be done if you have a family history of breast, ovarian, tubal, or peritoneal cancers. Pelvic exam and Pap test. This may be done every 3 years starting at age 80. Starting at age 27, this may be done every 5 years if you have a Pap test in combination with an HPV test. Bone density scan. This is done to screen for osteoporosis. You may have this scan if you are at high risk for osteoporosis. Discuss your test results, treatment options, and if necessary, the need for more tests with your health care provider. Vaccines  Your health care provider may recommend certain vaccines, such as: Influenza vaccine. This is recommended every year. Tetanus, diphtheria, and acellular pertussis (Tdap, Td) vaccine. You may need a Td booster every 10 years. Zoster vaccine. You may need this after age 6. Pneumococcal 13-valent conjugate (PCV13) vaccine. You may need this if you have certain conditions and were not previously vaccinated. Pneumococcal polysaccharide (PPSV23) vaccine. You may need one or two doses if you smoke cigarettes or if you have certain conditions. Talk to your health care provider about which screenings and vaccines you need and how often you need them. This information is not intended to replace advice given to you by your health care provider. Make sure you discuss any questions you have with your health care provider. Document Released: 08/31/2015 Document Revised: 04/23/2016 Document Reviewed: 06/05/2015 Elsevier Interactive Patient Education  2017 [employer].    Fall Prevention in the Home Falls can cause injuries. They can happen to people of all ages. There are many things you can do to make your home safe and to help prevent falls. What can I do on the outside of my home? Regularly fix the edges of walkways and driveways and fix any  cracks. Remove anything that might make you trip as you walk through a door, such as a raised step or threshold. Trim any bushes or trees on the path to your home. Use bright outdoor lighting. Clear any walking paths of anything that might make someone trip, such as rocks or tools. Regularly check to see if handrails are loose or broken. Make sure that both sides of any steps have handrails. Any raised decks and porches should have guardrails on the edges. Have any leaves, snow, or ice cleared regularly. Use sand or salt on walking paths during winter. Clean up any spills in your garage right away. This includes oil or grease spills. What can I do in the bathroom? Use night lights. Install grab bars by the toilet and in the tub and shower. Do not use towel bars as grab bars. Use non-skid mats or decals in the tub or shower. If you need to sit down in the shower, use a plastic, non-slip stool. Keep the floor dry. Clean up any water that spills on the floor as soon as  it happens. Remove soap buildup in the tub or shower regularly. Attach bath mats securely with double-sided non-slip rug tape. Do not have throw rugs and other things on the floor that can make you trip. What can I do in the bedroom? Use night lights. Make sure that you have a light by your bed that is easy to reach. Do not use any sheets or blankets that are too big for your bed. They should not hang down onto the floor. Have a firm chair that has side arms. You can use this for support while you get dressed. Do not have throw rugs and other things on the floor that can make you trip. What can I do in the kitchen? Clean up any spills right away. Avoid walking on wet floors. Keep items that you use a lot in easy-to-reach places. If you need to reach something above you, use a strong step stool that has a grab bar. Keep electrical cords out of the way. Do not use floor polish or wax that makes floors slippery. If you must  use wax, use non-skid floor wax. Do not have throw rugs and other things on the floor that can make you trip. What can I do with my stairs? Do not leave any items on the stairs. Make sure that there are handrails on both sides of the stairs and use them. Fix handrails that are broken or loose. Make sure that handrails are as long as the stairways. Check any carpeting to make sure that it is firmly attached to the stairs. Fix any carpet that is loose or worn. Avoid having throw rugs at the top or bottom of the stairs. If you do have throw rugs, attach them to the floor with carpet tape. Make sure that you have a light switch at the top of the stairs and the bottom of the stairs. If you do not have them, ask someone to add them for you. What else can I do to help prevent falls? Wear shoes that: Do not have high heels. Have rubber bottoms. Are comfortable and fit you well. Are closed at the toe. Do not wear sandals. If you use a stepladder: Make sure that it is fully opened. Do not climb a closed stepladder. Make sure that both sides of the stepladder are locked into place. Ask someone to hold it for you, if possible. Clearly mark and make sure that you can see: Any grab bars or handrails. First and last steps. Where the edge of each step is. Use tools that help you move around (mobility aids) if they are needed. These include: Canes. Walkers. Scooters. Crutches. Turn on the lights when you go into a dark area. Replace any light bulbs as soon as they burn out. Set up your furniture so you have a clear path. Avoid moving your furniture around. If any of your floors are uneven, fix them. If there are any pets around you, be aware of where they are. Review your medicines with your doctor. Some medicines can make you feel dizzy. This can increase your chance of falling. Ask your doctor what other things that you can do to help prevent falls. This information is not intended to replace  advice given to you by your health care provider. Make sure you discuss any questions you have with your health care provider. Document Released: 05/31/2009 Document Revised: 01/10/2016 Document Reviewed: 09/08/2014 Elsevier Interactive Patient Education  2017 [employer].

## 2023-01-14 NOTE — Progress Notes (Signed)
Subjective:   [name] is a 62 y.o. female who presents for Medicare Annual (Subsequent) preventive examination.  Review of Systems    Virtual Visit via Telephone Note  I connected with  [name] on 01/14/23 at  3:00 PM EDT by telephone and verified that I am speaking with the correct person using two identifiers.  Location: Patient: Home Provider: Office Persons participating in the virtual visit: patient/Nurse Health Advisor   I discussed the limitations, risks, security and privacy concerns of performing an evaluation and management service by telephone and the availability of in person appointments. The patient expressed understanding and agreed to proceed.  Interactive audio and video telecommunications were attempted between this nurse and patient, however failed, due to patient having technical difficulties OR patient did not have access to video capability.  We continued and completed visit with audio only.  Some vital signs may be absent or patient reported.   Tillie Rung, LPN  Cardiac Risk Factors include: advanced age (>65men, >24 women);diabetes mellitus     Objective:    Today's Vitals   01/14/23 1503  Weight: 152 lb (68.9 kg)  Height: 5\' 1"  (1.549 m)   Body mass index is 28.72 kg/m.     01/14/2023    3:21 PM 05/07/2022    8:53 AM 04/21/2022    8:21 PM 04/17/2022    3:12 PM 12/27/2021    3:29 PM 05/24/2021   12:00 PM 05/08/2021    8:18 AM  Advanced Directives  Does Patient Have a Medical Advance Directive? No No No No No No No  Does patient want to make changes to medical advance directive?     No - Patient declined    Would patient like information on creating a medical advance directive? No - Patient declined    No - Patient declined Yes (MAU/Ambulatory/Procedural Areas - Information given) Yes (MAU/Ambulatory/Procedural Areas - Information given)    Current Medications (verified) Outpatient Encounter Medications as of 01/14/2023   Medication Sig   Accu-Chek Softclix Lancets lancets USE TO CHECK BLOOD SUGARS 1-2 TIMES DAILY   acetaminophen (TYLENOL) 325 MG tablet Take 650 mg by mouth every 6 (six) hours as needed for mild pain, fever or headache.   albuterol (VENTOLIN HFA) 108 (90 Base) MCG/ACT inhaler Inhale 2 puffs into the lungs every 6 (six) hours as needed for wheezing or shortness of breath.   Alcohol Swabs (ALCOHOL PREP) PADS To use for blood sugar checks.   aspirin-acetaminophen-caffeine (EXCEDRIN MIGRAINE) 250-250-65 MG tablet Take 2 tablets by mouth every 6 (six) hours as needed for headache.   Blood Glucose Monitoring Suppl (ONE TOUCH ULTRA 2) w/Device KIT Use to test blood sugars 1-2 times daily.   buPROPion (WELLBUTRIN XL) 150 MG 24 hr tablet Take 450 mg by mouth every morning.   calcium carbonate (TUMS) 500 MG chewable tablet Chew 2 tablets (400 mg of elemental calcium total) by mouth 3 (three) times daily.   divalproex (DEPAKOTE ER) 250 MG 24 hr tablet Take 750 mg by mouth at bedtime.   glucose blood (ACCU-CHEK GUIDE) test strip Use to test blood sugars 1-2 times daily.   levothyroxine (SYNTHROID) 100 MCG tablet Take 1 tablet (100 mcg total) by mouth daily.   LORazepam (ATIVAN) 0.5 MG tablet Take 0.5-1 mg by mouth daily as needed for anxiety.   meclizine (ANTIVERT) 25 MG tablet Take 1 tablet (25 mg total) by mouth 3 (three) times daily as needed for dizziness.   metFORMIN (GLUCOPHAGE-XR)  500 MG 24 hr tablet Take 1 tablet (500 mg total) by mouth daily with breakfast.   Multiple Vitamin (MULTIVITAMIN WITH MINERALS) TABS tablet Take 1 tablet by mouth daily.   ondansetron (ZOFRAN) 4 MG tablet Take 1 tablet (4 mg total) by mouth every 8 (eight) hours as needed for nausea or vomiting.   pravastatin (PRAVACHOL) 20 MG tablet Take 1 tablet by mouth once daily   No facility-administered encounter medications on file as of 01/14/2023.    Allergies (verified) Morphine and codeine, Ambien [zolpidem], Duloxetine,  Latuda [lurasidone hcl], Morphine, and Shingrix [zoster vac recomb adjuvanted]   History: Past Medical History:  Diagnosis Date   Abnormal LFTs 04/06/2013   Very minor tried to reassure okay to repeat today with hepatitis C screen   Allergic rhinitis 03/26/2007   Autonomic dysfunction 01/11/2015   Bipolar 1 disorder    Hx of psychosis; present since age 21   Chest pain on breathing 03/16/2014   Chronic fatigue 06/21/2017   Chronic interstitial cystitis 11/15/2020   Community acquired pneumonia of right lung 08/17/2020   Complication of anesthesia    agitation   Constipation 05/17/2012   Costochondritis 02/14/2014   Diverticular disease of colon 08/06/2020   Double vision 03/15/2011   Dyspnea on effort 01/10/2011   Dysuria 02/15/2013   Early satiety 04/27/2013   Elevated lactic acid level    Endometriosis 05/11/2008   Fatty liver disease, nonalcoholic 02/13/2014   Fecal urgency 08/06/2020   Female stress incontinence 11/15/2020   Food aversion 11/08/2012   Frequency of urination    Gastroesophageal reflux disease 08/06/2020   Headache    History of duodenal ulcer    History of hysterectomy 11/15/2020   History of kidney stones    History of lithium toxicity 05/01/2014   Hoarseness of voice 02/27/2016   Hyperglycemia 01/10/2011   Hyperlipidemia, mixed 05/12/2017   Hyperprolactinemia 06/26/2015   Insomnia 05/12/2017   Irritable bowel syndrome 08/06/2020   Jaw pain 02/13/2012   poss tmj area     Left upper quadrant pain 08/06/2020   Leukocytosis 05/01/2014   Lump of breast, left 12/20/2012   about 6 oclock    Mass of urethra 11/15/2020   Mild cognitive impairment of uncertain or unknown etiology 10/16/2020   Multinodular goiter 05/12/2017   Nocturia    Nondiabetic gastroparesis 01/11/2015   Noninvasive follicular neoplasm of thyroid with papillary-like nuclear features 10/10/2021   Palpitations 12/08/2010   Pelvic pain    Periumbilical pain 08/06/2020   Plantar  fasciitis 06/11/2007   Postsurgical hypothyroidism 09/03/2021   Pruritus ani 08/06/2020   Right ear pain 02/16/2012   Social anxiety disorder    Subacute bronchitis 07/10/2018   Type II diabetes mellitus 10/10/2021   Unstable gait 07/05/2022   Urgency of urination    Vitamin B12 deficiency 09/03/2021   Vitamin D deficiency 09/23/2011   Past Surgical History:  Procedure Laterality Date   CARDIAC CATHETERIZATION  10-09-1999  DR KATZ   NORMAL LVF/  NORMAL RCA/  NO CRITICAL DISEASE LEFT CORONARY SYSTEM   CARDIOVASCULAR STRESS TEST  01-23-2011   NORMAL NUCLEAR STUDY/ NO ISCHEMIA/ EF 81%   CYSTOSCOPY WITH BIOPSY N/A 06/03/2013   Procedure: CYSTOSCOPY WITH BIOPSY  INSTILLATION OF MARCAINE AND PYRIDIUM;  Surgeon: Lindaann Slough, MD;  Location: [facility];  Service: Urology;  Laterality: N/A;   LAPAROSCOPIC CHOLECYSTECTOMY  11-08-2001   LAPAROSCOPIC LEFT SALPINGOOPHORECTOMY AND LYSYS ADHESIONS  03-10-2002   LAPAROSCOPIC REMOVAL [place] REMNANT  2003  [place]   LAPAROSCOPY N/A 12/14/2012   Procedure: LAPAROSCOPY DIAGNOSTIC;  Surgeon: Almond Lint, MD;  Location: [facility];  Service: General;  Laterality: N/A;   THYROIDECTOMY N/A 05/24/2021   Procedure: TOTAL THYROIDECTOMY;  Surgeon: Darnell Level, MD;  Location: [facility];  Service: General;  Laterality: N/A;   TOTAL ABDOMINAL HYSTERECTOMY  2000   W/ RIGHT SALPINGOOPHORECTOMY   TRANSTHORACIC ECHOCARDIOGRAM  10-13-2010   NORMAL LVF/  EF 55-60%   Family History  Problem Relation Age of Onset   Sleep apnea Father    Depression Father    Alcohol abuse Father    Hypertension Father    Migraines Sister        headaches   Anxiety disorder Sister    Other Mother        MAC infection   Anxiety disorder Mother    Dementia Mother        likely Alzheimer's disease; symptom onset on late 70s/early 80s   Heart disease Paternal Grandmother    Hyperlipidemia Paternal Grandmother    Alcohol abuse Paternal Grandfather    Diabetes Neg  Hx    Social History   Socioeconomic History   Marital status: Married    Spouse name: Not on file   Number of children: 0   Years of education: 16   Highest education level: Bachelor's degree (e.g., BA, AB, BS)  Occupational History   Occupation: Disability    Comment: Nurse  Tobacco Use   Smoking status: Former    Packs/day: 0.01    Years: 1.00    Additional pack years: 0.00    Total pack years: 0.01    Types: Cigarettes    Quit date: 1980    Years since quitting: 44.4   Smokeless tobacco: Never   Tobacco comments:    ONLY SMOKED FOR 6 MONTHS --  QUIT  YRS AGO  Vaping Use   Vaping Use: Never used  Substance and Sexual Activity   Alcohol use: Not Currently    Comment: occ   Drug use: Never   Sexual activity: Not on file  Other Topics Concern   Not on file  Social History Narrative   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    no tobacco   Caffeine Use: very little, three times a week   Left handed    Social Determinants of Health   Financial Resource Strain: Low Risk  (01/14/2023)   Overall Financial Resource Strain (CARDIA)    Difficulty of Paying Living Expenses: Not hard at all  Food Insecurity: No Food Insecurity (01/14/2023)   Hunger Vital Sign    Worried About Running Out of Food in the Last Year: Never true    Ran Out of Food in the Last Year: Never true  Transportation Needs: No Transportation Needs (01/14/2023)   PRAPARE - Administrator, Civil Service (Medical): No    Lack of Transportation (Non-Medical): No  Physical Activity: Insufficiently Active (01/14/2023)   Exercise Vital Sign    Days of Exercise per Week: 3 days    Minutes of Exercise per Session: 10 min  Stress: Stress Concern Present (01/14/2023)   [employer] of Occupational Health - Occupational Stress Questionnaire    Feeling of Stress : To some  extent  Social Connections: Moderately Isolated (01/14/2023)   Social Connection and Isolation Panel [NHANES]  Frequency of Communication with Friends and Family: More than three times a week    Frequency of Social Gatherings with Friends and Family: More than three times a week    Attends Religious Services: Never    Database administrator or Organizations: No    Attends Engineer, structural: Never    Marital Status: Married    Tobacco Counseling Counseling given: Not Answered Tobacco comments: ONLY SMOKED FOR 6 MONTHS --  QUIT  YRS AGO   Clinical Intake:  Pre-visit preparation completed: No  Pain : No/denies pain   Nutrition Risk Assessment:  Has the patient had any N/V/D within the last 2 months?  No  Does the patient have any non-healing wounds?  No  Has the patient had any unintentional weight loss or weight gain?  No   Diabetes:  Is the patient diabetic?  Yes  If diabetic, was a CBG obtained today?  No  Did the patient bring in their glucometer from home?  No  How often do you monitor your CBG's? 3 x Weekly.   Financial Strains and Diabetes Management:  Are you having any financial strains with the device, your supplies or your medication? No .  Does the patient want to be seen by Chronic Care Management for management of their diabetes?  No  Would the patient like to be referred to a Nutritionist or for Diabetic Management?  No   Diabetic Exams:  Diabetic Eye Exam: Completed . Overdue for diabetic eye exam. Pt has been advised about the importance in completing this exam. A referral has been placed today. Message sent to referral coordinator for scheduling purposes. Advised pt to expect a call from office referred to regarding appt.  Diabetic Foot Exam: Completed . Pt has been advised about the importance in completing this exam. Pt is scheduled for diabetic foot exam on Followed by [facility]    BMI - recorded: 28.72 Nutritional Status: BMI 25 -29  Overweight Nutritional Risks: None Diabetes: Yes CBG done?: No Did pt. bring in CBG monitor from home?: No  How often do you need to have someone help you when you read instructions, pamphlets, or other written materials from your doctor or pharmacy?: 1 - Never  Diabetic?  Yes  Interpreter Needed?: No  Information entered by :: Theresa Mulligan LPN   Activities of Daily Living    01/14/2023    3:17 PM  In your present state of health, do you have any difficulty performing the following activities:  Hearing? 0  Vision? 0  Difficulty concentrating or making decisions? 0  Walking or climbing stairs? 0  Dressing or bathing? 0  Doing errands, shopping? 0  Preparing Food and eating ? N  Using the Toilet? N  In the past six months, have you accidently leaked urine? Y  Comment Wears pads. Followed by PCP  Do you have problems with loss of bowel control? N  Managing your Medications? N  Managing your Finances? N  Housekeeping or managing your Housekeeping? N    Patient Care Team: [place], [name], MD as PCP - General (Family Medicine) Archer Asa, MD (Psychiatry) Charna Elizabeth, MD as Attending Physician (Gastroenterology) Micki Riley, MD as Consulting Physician (Neurology) Su Grand, MD (Inactive) as Consulting Physician (Urology) Lennette Bihari, MD as Consulting Physician (Cardiology) Jaquita Folds, MD as Referring Physician (Neurology) Noland Fordyce, MD as Consulting Physician (Obstetrics and Gynecology) Elvina Sidle, MD as Consulting Physician (Neurology) Glendale Chard, DO as Consulting Physician (Neurology)  Indicate any recent Medical Services you may have received from other than [facility] providers in the past year (date may be approximate).     Assessment:   This is a routine wellness examination for [facility].  Hearing/Vision screen Hearing Screening - Comments:: Denies hearing difficulties   Vision Screening - Comments:: Wears rx glasses - up to  date with routine eye exams with  [facility]  Dietary issues and exercise activities discussed: Exercise limited by: None identified   Goals Addressed               This Visit's Progress     Increase physical activity (pt-stated)        Get healthy.       Depression Screen    01/14/2023    3:16 PM 01/14/2023    3:14 PM 10/03/2022    7:55 AM 07/04/2022    9:33 AM 12/27/2021    3:16 PM 11/15/2021    8:56 AM 09/03/2021    8:56 AM  PHQ 2/9 Scores  PHQ - 2 Score 2 0 3 0 2 2 1   PHQ- 9 Score 2  7  4 13 5     Fall Risk    01/14/2023    3:19 PM 10/03/2022    7:54 AM 07/04/2022    9:33 AM 12/27/2021    3:28 PM 11/15/2021    8:56 AM  Fall Risk   Falls in the past year? 1 1 0 1 1  Number falls in past yr: 0 1 0 0 1  Injury with Fall? 0 0 0 0 0  Comment    No injury or medical attention needed   Risk for fall due to : No Fall Risks Other (Comment) No Fall Risks No Fall Risks History of fall(s)  Follow up Falls prevention discussed Falls evaluation completed Falls evaluation completed  Falls evaluation completed    FALL RISK PREVENTION PERTAINING TO THE HOME:  Any stairs in or around the home? Yes  If so, are there any without handrails? No  Home free of loose throw rugs in walkways, pet beds, electrical cords, etc? Yes  Adequate lighting in your home to reduce risk of falls? Yes   ASSISTIVE DEVICES UTILIZED TO PREVENT FALLS:  Life alert? No  Use of a cane, walker or w/c? Yes  Grab bars in the bathroom? No  Shower chair or bench in shower? No  Elevated toilet seat or a handicapped toilet? No   TIMED UP AND GO:  Was the test performed? No . Audio visit   Cognitive Function:      04/23/2022   11:17 AM 04/23/2022   11:00 AM  Montreal Cognitive Assessment   Visuospatial/ Executive (0/5) 2 2  Naming (0/3) 3 3  Attention: Read list of digits (0/2) 2 2  Attention: Read list of letters (0/1) 1 1  Attention: Serial 7 subtraction starting at 100 (0/3) 3 3  Language:  Repeat phrase (0/2) 2 2  Language : Fluency (0/1) 0 0  Abstraction (0/2) 2 2  Delayed Recall (0/5) 3 3  Orientation (0/6) 6 6  Total 24 24  Adjusted Score (based on education) 24 24      01/14/2023    3:21 PM 12/27/2021    3:29 PM 12/05/2020    2:07 PM  6CIT Screen  What Year? 0 points 0 points 4 points  What month? 0 points 0 points 3 points  What time? 0 points 0 points   Count back from  20 0 points 0 points 0 points  Months in reverse 4 points 0 points 4 points  Repeat phrase 0 points 0 points 4 points  Total Score 4 points 0 points     Immunizations Immunization History  Administered Date(s) Administered   Influenza Split 07/18/2011   Influenza, Quadrivalent, Recombinant, Inj, Pf 05/19/2019   Influenza,inj,Quad PF,6+ Mos 07/02/2018, 08/06/2020   Influenza-Unspecified 06/06/2021   Moderna Sars-Covid-2 Vaccination 10/17/2019, 11/17/2019, 06/29/2020   Pfizer Covid-19 Vaccine Bivalent Booster 36yrs & up 06/06/2021, 06/12/2022   Pneumococcal Polysaccharide-23 07/02/2018   Td 06/13/2009   Tdap 06/13/2009   Zoster Recombinat (Shingrix) 10/11/2021, 06/12/2022      Flu Vaccine status: Up to date    Covid-19 vaccine status: Completed vaccines  Qualifies for Shingles Vaccine? Yes   Zostavax completed Yes   Shingrix Completed?: Yes  Screening Tests Health Maintenance  Topic Date Due   OPHTHALMOLOGY EXAM  01/14/2023 (Originally 01/01/1971)   COVID-19 Vaccine (6 - 2023-24 season) 01/30/2023 (Originally 08/07/2022)   MAMMOGRAM  01/14/2024 (Originally 09/19/2019)   INFLUENZA VACCINE  03/19/2023   HEMOGLOBIN A1C  07/01/2023   Diabetic kidney evaluation - Urine ACR  08/20/2023   Diabetic kidney evaluation - eGFR measurement  10/04/2023   FOOT EXAM  12/29/2023   Medicare Annual Wellness (AWV)  01/14/2024   Colonoscopy  09/01/2032   Hepatitis C Screening  Completed   HIV Screening  Completed   Zoster Vaccines- Shingrix  Completed   HPV VACCINES  Aged Out   DTaP/Tdap/Td   Discontinued   PAP SMEAR-Modifier  Discontinued    Health Maintenance  There are no preventive care reminders to display for this patient.   Colorectal cancer screening: Type of screening: Colonoscopy. Completed 09/01/22. Repeat every 10 years  Mammogram status: Ordered Deferred. Pt provided with contact info and advised to call to schedule appt.     Lung Cancer Screening: (Low Dose CT Chest recommended if Age 28-80 years, 30 pack-year currently smoking OR have quit w/in 15years.) does not qualify.     Additional Screening:  Hepatitis C Screening: does qualify; Completed 04/06/13  Vision Screening: Recommended annual ophthalmology exams for early detection of glaucoma and other disorders of the eye. Is the patient up to date with their annual eye exam?  Yes  Who is the provider or what is the name of the office in which the patient attends annual eye exams? [facility] If pt is not established with a provider, would they like to be referred to a provider to establish care? No .   Dental Screening: Recommended annual dental exams for proper oral hygiene  Community Resource Referral / Chronic Care Management:  CRR required this visit?  No   CCM required this visit?  No      Plan:     I have personally reviewed and noted the following in the patient's chart:   Medical and social history Use of alcohol, tobacco or illicit drugs  Current medications and supplements including opioid prescriptions. Patient is not currently taking opioid prescriptions. Functional ability and status Nutritional status Physical activity Advanced directives List of other physicians Hospitalizations, surgeries, and ER visits in previous 12 months Vitals Screenings to include cognitive, depression, and falls Referrals and appointments  In addition, I have reviewed and discussed with patient certain preventive protocols, quality metrics, and best practice recommendations. A written  personalized care plan for preventive services as well as general preventive health recommendations were provided to patient.     Sylvester Harder  Andrey Campanile, LPN   11/24/8117   Nurse Notes: None

## 2023-01-20 ENCOUNTER — Institutional Professional Consult (permissible substitution): Payer: Medicare PPO | Admitting: Psychology

## 2023-01-20 ENCOUNTER — Encounter: Payer: Medicare PPO | Admitting: Psychology

## 2023-01-20 ENCOUNTER — Ambulatory Visit: Payer: Self-pay

## 2023-01-21 ENCOUNTER — Ambulatory Visit: Payer: Medicare PPO | Admitting: Podiatry

## 2023-01-29 ENCOUNTER — Encounter: Payer: Medicare PPO | Admitting: Psychology

## 2023-01-30 ENCOUNTER — Ambulatory Visit: Payer: Medicare PPO | Admitting: Neurology

## 2023-02-02 ENCOUNTER — Encounter: Payer: Self-pay | Admitting: Neurology

## 2023-02-03 ENCOUNTER — Ambulatory Visit: Payer: Medicare PPO | Admitting: Neurology

## 2023-02-27 ENCOUNTER — Ambulatory Visit: Payer: Medicare PPO | Admitting: Family Medicine

## 2023-03-02 ENCOUNTER — Encounter: Payer: Self-pay | Admitting: Family Medicine

## 2023-03-02 ENCOUNTER — Ambulatory Visit (INDEPENDENT_AMBULATORY_CARE_PROVIDER_SITE_OTHER): Payer: Medicare PPO

## 2023-03-02 ENCOUNTER — Ambulatory Visit (INDEPENDENT_AMBULATORY_CARE_PROVIDER_SITE_OTHER): Payer: Medicare PPO | Admitting: Family Medicine

## 2023-03-02 VITALS — BP 128/80 | HR 80 | Temp 97.4°F | Wt 148.6 lb

## 2023-03-02 DIAGNOSIS — N3010 Interstitial cystitis (chronic) without hematuria: Secondary | ICD-10-CM

## 2023-03-02 DIAGNOSIS — R531 Weakness: Secondary | ICD-10-CM | POA: Diagnosis not present

## 2023-03-02 DIAGNOSIS — R9389 Abnormal findings on diagnostic imaging of other specified body structures: Secondary | ICD-10-CM | POA: Diagnosis not present

## 2023-03-02 DIAGNOSIS — R059 Cough, unspecified: Secondary | ICD-10-CM

## 2023-03-02 DIAGNOSIS — R6883 Chills (without fever): Secondary | ICD-10-CM | POA: Diagnosis not present

## 2023-03-02 DIAGNOSIS — E1169 Type 2 diabetes mellitus with other specified complication: Secondary | ICD-10-CM

## 2023-03-02 DIAGNOSIS — R002 Palpitations: Secondary | ICD-10-CM | POA: Diagnosis not present

## 2023-03-02 DIAGNOSIS — R0789 Other chest pain: Secondary | ICD-10-CM | POA: Diagnosis not present

## 2023-03-02 DIAGNOSIS — J069 Acute upper respiratory infection, unspecified: Secondary | ICD-10-CM | POA: Diagnosis not present

## 2023-03-02 DIAGNOSIS — R079 Chest pain, unspecified: Secondary | ICD-10-CM | POA: Diagnosis not present

## 2023-03-02 LAB — POCT URINALYSIS DIPSTICK
Bilirubin, UA: NEGATIVE
Blood, UA: NEGATIVE
Glucose, UA: NEGATIVE
Ketones, UA: NEGATIVE
Nitrite, UA: NEGATIVE
Protein, UA: POSITIVE — AB
Spec Grav, UA: 1.02 (ref 1.010–1.025)
Urobilinogen, UA: NEGATIVE E.U./dL — AB
pH, UA: 6 (ref 5.0–8.0)

## 2023-03-02 LAB — GLUCOSE, POCT (MANUAL RESULT ENTRY): POC Glucose: 94 mg/dl (ref 70–99)

## 2023-03-02 LAB — POC COVID19 BINAXNOW: SARS Coronavirus 2 Ag: NEGATIVE

## 2023-03-02 NOTE — Progress Notes (Signed)
Established Patient Office Visit   Subjective  Patient ID: [name], female    DOB: [birth year]  Age: 62 y.o. MRN: [id number]  Chief Complaint  Patient presents with   Cough    Cough, BA, chills and ear pain for 3-4 days. Tool tylenol and OTC meds. Also started having chest pain on Saturday, has had pneumonia before, sounds like there is something deep down in chest. Feels real weak, heart was racing/ pounding before coming in. Ear pain is in left ear, comes and goes   Pt accompanied by her wife.  Patient is a 62 year old female followed by Dr. Swaziland and seen for acute concern.  Patient endorses body aches, weakness, left ear pain, palpitations, CP, chills, cough x 4 days.  Patient denies fever, nausea, vomiting, diarrhea, changes in medication, sick contacts.  Tried Tylenol for symptoms.  Patient sleeping more.  Has not had much to drink today.  States BS was 168 this morning.    Patient Active Problem List   Diagnosis Date Noted   Unstable gait 07/05/2022   Noninvasive follicular neoplasm of thyroid with papillary-like nuclear features 10/10/2021   Type II diabetes mellitus 10/10/2021   Postsurgical hypothyroidism 09/03/2021   Vitamin B12 deficiency 09/03/2021   Female stress incontinence 11/15/2020   History of hysterectomy 11/15/2020   Mass of urethra 11/15/2020   Osteopenia 11/15/2020   Chronic interstitial cystitis 11/15/2020   Mild cognitive impairment of uncertain or unknown etiology 10/16/2020   Social anxiety disorder    Elevated lactic acid level    Diverticular disease of colon 08/06/2020   Fecal urgency 08/06/2020   Gastroesophageal reflux disease 08/06/2020   Irritable bowel syndrome 08/06/2020   Left upper quadrant pain 08/06/2020   Periumbilical pain 08/06/2020   Pruritus ani 08/06/2020   Chronic fatigue 06/21/2017   Hyperlipidemia, mixed 05/12/2017   Insomnia 05/12/2017   Multinodular goiter 05/12/2017   Hoarseness of voice 02/27/2016    Hyperprolactinemia 06/26/2015   Autonomic dysfunction 01/11/2015   Bipolar 1 disorder 01/11/2015   Nondiabetic gastroparesis 01/11/2015   History of lithium toxicity 05/01/2014   Leukocytosis 05/01/2014   Costochondritis 02/14/2014   Fatty liver disease, nonalcoholic 02/13/2014   Early satiety 04/27/2013   Abnormal LFTs 04/06/2013   Dysuria 02/15/2013   Lump of breast, left 12/20/2012   Loss of weight 11/08/2012   Food aversion 11/08/2012   Constipation 05/17/2012   Jaw pain 02/13/2012   Vitamin D deficiency 09/23/2011   Double vision 03/15/2011   Hyperglycemia 01/10/2011   Dyspnea on effort 01/10/2011   Palpitations 12/08/2010   Endometriosis 05/11/2008   Plantar fasciitis 06/11/2007   Allergic rhinitis 03/26/2007   Past Surgical History:  Procedure Laterality Date   CARDIAC CATHETERIZATION  10-09-1999  DR KATZ   NORMAL LVF/  NORMAL RCA/  NO CRITICAL DISEASE LEFT CORONARY SYSTEM   CARDIOVASCULAR STRESS TEST  01-23-2011   NORMAL NUCLEAR STUDY/ NO ISCHEMIA/ EF 81%   CYSTOSCOPY WITH BIOPSY N/A 06/03/2013   Procedure: CYSTOSCOPY WITH BIOPSY  INSTILLATION OF MARCAINE AND PYRIDIUM;  Surgeon: Lindaann Slough, MD;  Location: [facility];  Service: Urology;  Laterality: N/A;   LAPAROSCOPIC CHOLECYSTECTOMY  11-08-2001   LAPAROSCOPIC LEFT SALPINGOOPHORECTOMY AND LYSYS ADHESIONS  03-10-2002   LAPAROSCOPIC REMOVAL OVARY REMNANT  2003   [place]   LAPAROSCOPY N/A 12/14/2012   Procedure: LAPAROSCOPY DIAGNOSTIC;  Surgeon: Almond Lint, MD;  Location: [facility];  Service: General;  Laterality: N/A;   THYROIDECTOMY N/A 05/24/2021   Procedure: TOTAL  THYROIDECTOMY;  Surgeon: Darnell Level, MD;  Location: [facility];  Service: General;  Laterality: N/A;   TOTAL ABDOMINAL HYSTERECTOMY  2000   W/ RIGHT SALPINGOOPHORECTOMY   TRANSTHORACIC ECHOCARDIOGRAM  10-13-2010   NORMAL LVF/  EF 55-60%   Social History   Tobacco Use   Smoking status: Former    Current packs/day: 0.00    Types:  Cigarettes    Start date: 1979    Quit date: 1980    Years since quitting: 44.5   Smokeless tobacco: Never   Tobacco comments:    ONLY SMOKED FOR 6 MONTHS --  QUIT  YRS AGO  Vaping Use   Vaping status: Never Used  Substance Use Topics   Alcohol use: Not Currently    Comment: occ   Drug use: Never   Family History  Problem Relation Age of Onset   Sleep apnea Father    Depression Father    Alcohol abuse Father    Hypertension Father    Migraines Sister        headaches   Anxiety disorder Sister    Other Mother        MAC infection   Anxiety disorder Mother    Dementia Mother        likely Alzheimer's disease; symptom onset on late 70s/early 80s   Heart disease Paternal Grandmother    Hyperlipidemia Paternal Grandmother    Alcohol abuse Paternal Grandfather    Diabetes Neg Hx    Allergies  Allergen Reactions   Morphine And Codeine Nausea And Vomiting and Nausea Only   Ambien [Zolpidem] Other (See Comments)    Per patient this caused her to fall   Duloxetine Swelling and Other (See Comments)    Other reaction(s): SWELLING/EDEMA   Latuda [Lurasidone Hcl]    Morphine Nausea Only   Shingrix [Zoster Vac Recomb Adjuvanted]     Dizziness vomiting      ROS Negative unless stated above    Objective:     BP 128/80 (BP Location: Left Arm, Patient Position: Sitting, Cuff Size: Normal)   Pulse 80   Temp (!) 97.4 F (36.3 C) (Temporal)   Wt 148 lb 9.6 oz (67.4 kg)   SpO2 95%   BMI 28.08 kg/m    Physical Exam Constitutional:      General: She is not in acute distress.    Appearance: Normal appearance. She is ill-appearing. She is not toxic-appearing.  HENT:     Head: Normocephalic and atraumatic.     Ears:     Comments: Bilateral TMs full.  No effusion, erythema, suppurative fluid    Nose: Nose normal.     Mouth/Throat:     Mouth: Mucous membranes are dry.  Cardiovascular:     Rate and Rhythm: Normal rate and regular rhythm.     Heart sounds: Normal heart  sounds. No murmur heard.    No gallop.  Pulmonary:     Effort: Pulmonary effort is normal. No respiratory distress.     Breath sounds: Normal breath sounds. No wheezing, rhonchi or rales.  Skin:    General: Skin is warm and dry.  Neurological:     Mental Status: She is oriented to person, place, and time. She is lethargic.  Psychiatric:        Behavior: Behavior is cooperative.      Results for orders placed or performed in visit on 03/02/23  POC COVID-19  Result Value Ref Range   SARS Coronavirus 2 Ag Negative Negative  POCT urinalysis dipstick  Result Value Ref Range   Color, UA yellow    Clarity, UA clear    Glucose, UA Negative Negative   Bilirubin, UA neg    Ketones, UA neg    Spec Grav, UA 1.020 1.010 - 1.025   Blood, UA neg    pH, UA 6.0 5.0 - 8.0   Protein, UA Positive (A) Negative   Urobilinogen, UA negative (A) 0.2 or 1.0 E.U./dL   Nitrite, UA neg    Leukocytes, UA Small (1+) (A) Negative   Appearance     Odor    POC Glucose (CBG)  Result Value Ref Range   POC Glucose 94 70 - 99 mg/dl      Assessment & Plan:  Cough, unspecified type -     POC COVID-19 BinaxNow -     DG Chest 2 View  Viral URI  Chills -     POCT urinalysis dipstick -     DG Chest 2 View -     Urine Culture  Weakness -     POCT urinalysis dipstick -     DG Chest 2 View  Type 2 diabetes mellitus with other specified complication, without long-term current use of insulin (HCC) -     POCT glucose (manual entry)  Other chest pain -     DG Chest 2 View  Chronic interstitial cystitis -     Urine Culture  New problem.  Symptoms likely 2/2 URI due to viral etiology.  COVID testing negative.  Blood sugar stable.  History of IC, UA with 1+ leuks.  Will obtain UCX.  Will pain CXR to evaluate for possible PNA.  Continue supportive care including OTC cough/cold medications, hydration.  Discussed proceeding to nearest ED for worsening symptoms or inability to stay hydrated.  Return if  symptoms worsen or fail to improve.   Deeann Saint, MD

## 2023-03-03 LAB — URINE CULTURE
MICRO NUMBER:: 15200779
SPECIMEN QUALITY:: ADEQUATE

## 2023-03-16 ENCOUNTER — Ambulatory Visit: Payer: Medicare PPO | Admitting: Neurology

## 2023-03-16 ENCOUNTER — Encounter: Payer: Self-pay | Admitting: Neurology

## 2023-03-16 DIAGNOSIS — Z029 Encounter for administrative examinations, unspecified: Secondary | ICD-10-CM

## 2023-03-25 NOTE — Progress Notes (Deleted)
ACUTE VISIT No chief complaint on file.  HPI: [name] is a 62 y.o. female, who is here today complaining of *** HPI  Review of Systems See other pertinent positives and negatives in HPI.  Current Outpatient Medications on File Prior to Visit  Medication Sig Dispense Refill   Accu-Chek Softclix Lancets lancets USE TO CHECK BLOOD SUGARS 1-2 TIMES DAILY 200 each 2   acetaminophen (TYLENOL) 325 MG tablet Take 650 mg by mouth every 6 (six) hours as needed for mild pain, fever or headache.     albuterol (VENTOLIN HFA) 108 (90 Base) MCG/ACT inhaler Inhale 2 puffs into the lungs every 6 (six) hours as needed for wheezing or shortness of breath. 18 g 1   Alcohol Swabs (ALCOHOL PREP) PADS To use for blood sugar checks. 100 each 3   aspirin-acetaminophen-caffeine (EXCEDRIN MIGRAINE) 250-250-65 MG tablet Take 2 tablets by mouth every 6 (six) hours as needed for headache.     Blood Glucose Monitoring Suppl (ONE TOUCH ULTRA 2) w/Device KIT Use to test blood sugars 1-2 times daily. 1 kit 0   buPROPion (WELLBUTRIN XL) 150 MG 24 hr tablet Take 450 mg by mouth every morning.     calcium carbonate (TUMS) 500 MG chewable tablet Chew 2 tablets (400 mg of elemental calcium total) by mouth 3 (three) times daily. 90 tablet 1   divalproex (DEPAKOTE ER) 250 MG 24 hr tablet Take 750 mg by mouth at bedtime.     glucose blood (ACCU-CHEK GUIDE) test strip Use to test blood sugars 1-2 times daily. 200 each 2   levothyroxine (SYNTHROID) 100 MCG tablet Take 1 tablet (100 mcg total) by mouth daily. 90 tablet 3   LORazepam (ATIVAN) 0.5 MG tablet Take 0.5-1 mg by mouth daily as needed for anxiety.     meclizine (ANTIVERT) 25 MG tablet Take 1 tablet (25 mg total) by mouth 3 (three) times daily as needed for dizziness. 30 tablet 0   metFORMIN (GLUCOPHAGE-XR) 500 MG 24 hr tablet Take 1 tablet (500 mg total) by mouth daily with breakfast. 90 tablet 3   Multiple Vitamin (MULTIVITAMIN WITH MINERALS) TABS tablet Take  1 tablet by mouth daily.     ondansetron (ZOFRAN) 4 MG tablet Take 1 tablet (4 mg total) by mouth every 8 (eight) hours as needed for nausea or vomiting. 20 tablet 0   pravastatin (PRAVACHOL) 20 MG tablet Take 1 tablet by mouth once daily 90 tablet 2   No current facility-administered medications on file prior to visit.    Past Medical History:  Diagnosis Date   Abnormal LFTs 04/06/2013   Very minor tried to reassure okay to repeat today with hepatitis C screen   Allergic rhinitis 03/26/2007   Autonomic dysfunction 01/11/2015   Bipolar 1 disorder    Hx of psychosis; present since age 82   Chest pain on breathing 03/16/2014   Chronic fatigue 06/21/2017   Chronic interstitial cystitis 11/15/2020   Community acquired pneumonia of right lung 08/17/2020   Complication of anesthesia    agitation   Constipation 05/17/2012   Costochondritis 02/14/2014   Diverticular disease of colon 08/06/2020   Double vision 03/15/2011   Dyspnea on effort 01/10/2011   Dysuria 02/15/2013   Early satiety 04/27/2013   Elevated lactic acid level    Endometriosis 05/11/2008   Fatty liver disease, nonalcoholic 02/13/2014   Fecal urgency 08/06/2020   Female stress incontinence 11/15/2020   Food aversion 11/08/2012   Frequency of urination  Gastroesophageal reflux disease 08/06/2020   Headache    History of duodenal ulcer    History of hysterectomy 11/15/2020   History of kidney stones    History of lithium toxicity 05/01/2014   Hoarseness of voice 02/27/2016   Hyperglycemia 01/10/2011   Hyperlipidemia, mixed 05/12/2017   Hyperprolactinemia 06/26/2015   Insomnia 05/12/2017   Irritable bowel syndrome 08/06/2020   Jaw pain 02/13/2012   poss tmj area     Left upper quadrant pain 08/06/2020   Leukocytosis 05/01/2014   Lump of breast, left 12/20/2012   about 6 oclock    Mass of urethra 11/15/2020   Mild cognitive impairment of uncertain or unknown etiology 10/16/2020   Multinodular goiter  05/12/2017   Nocturia    Nondiabetic gastroparesis 01/11/2015   Noninvasive follicular neoplasm of thyroid with papillary-like nuclear features 10/10/2021   Palpitations 12/08/2010   Pelvic pain    Periumbilical pain 08/06/2020   Plantar fasciitis 06/11/2007   Postsurgical hypothyroidism 09/03/2021   Pruritus ani 08/06/2020   Right ear pain 02/16/2012   Social anxiety disorder    Subacute bronchitis 07/10/2018   Type II diabetes mellitus 10/10/2021   Unstable gait 07/05/2022   Urgency of urination    Vitamin B12 deficiency 09/03/2021   Vitamin D deficiency 09/23/2011   Allergies  Allergen Reactions   Morphine And Codeine Nausea And Vomiting and Nausea Only   Ambien [Zolpidem] Other (See Comments)    Per patient this caused her to fall   Duloxetine Swelling and Other (See Comments)    Other reaction(s): SWELLING/EDEMA   Latuda [Lurasidone Hcl]    Morphine Nausea Only   Shingrix [Zoster Vac Recomb Adjuvanted]     Dizziness vomiting    Social History   Socioeconomic History   Marital status: Married    Spouse name: Not on file   Number of children: 0   Years of education: 16   Highest education level: Bachelor's degree (e.g., BA, AB, BS)  Occupational History   Occupation: Disability    Comment: Nurse  Tobacco Use   Smoking status: Former    Current packs/day: 0.00    Types: Cigarettes    Start date: 1979    Quit date: 1980    Years since quitting: 44.6   Smokeless tobacco: Never   Tobacco comments:    ONLY SMOKED FOR 6 MONTHS --  QUIT  YRS AGO  Vaping Use   Vaping status: Never Used  Substance and Sexual Activity   Alcohol use: Not Currently    Comment: occ   Drug use: Never   Sexual activity: Not on file  Other Topics Concern   Not on file  Social History Narrative   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    no tobacco    Caffeine Use: very little, three times a week   Left handed    Social Determinants of Health   Financial Resource Strain: Low Risk  (01/14/2023)   Overall Financial Resource Strain (CARDIA)    Difficulty of Paying Living Expenses: Not hard at all  Food Insecurity: No Food Insecurity (01/14/2023)   Hunger Vital Sign    Worried About Running Out of Food in the Last Year: Never true    Ran Out of Food in the Last Year: Never true  Transportation Needs: No Transportation Needs (01/14/2023)   PRAPARE - Transportation  Lack of Transportation (Medical): No    Lack of Transportation (Non-Medical): No  Physical Activity: Insufficiently Active (01/14/2023)   Exercise Vital Sign    Days of Exercise per Week: 3 days    Minutes of Exercise per Session: 10 min  Stress: Stress Concern Present (01/14/2023)   [employer] of Occupational Health - Occupational Stress Questionnaire    Feeling of Stress : To some extent  Social Connections: Moderately Isolated (01/14/2023)   Social Connection and Isolation Panel [NHANES]    Frequency of Communication with Friends and Family: More than three times a week    Frequency of Social Gatherings with Friends and Family: More than three times a week    Attends Religious Services: Never    Database administrator or Organizations: No    Attends Banker Meetings: Never    Marital Status: Married    There were no vitals filed for this visit. There is no height or weight on file to calculate BMI.  Physical Exam  ASSESSMENT AND PLAN: There are no diagnoses linked to this encounter.  No follow-ups on file.  [name]. Swaziland, MD  [facility] [place] [facility].  Discharge Instructions   None

## 2023-03-27 ENCOUNTER — Ambulatory Visit: Payer: Medicare PPO | Admitting: Family Medicine

## 2023-03-30 ENCOUNTER — Ambulatory Visit: Payer: Medicare PPO | Admitting: Podiatry

## 2023-03-30 DIAGNOSIS — M79675 Pain in left toe(s): Secondary | ICD-10-CM | POA: Diagnosis not present

## 2023-03-30 DIAGNOSIS — B351 Tinea unguium: Secondary | ICD-10-CM

## 2023-03-30 DIAGNOSIS — M79674 Pain in right toe(s): Secondary | ICD-10-CM

## 2023-03-30 MED ORDER — TERBINAFINE HCL 250 MG PO TABS: 250.0000 mg | ORAL_TABLET | Freq: Every day | ORAL | 0 refills | Status: DC

## 2023-03-30 NOTE — Progress Notes (Signed)
Chief Complaint  Patient presents with   Nail Problem    np diabetic foot care/ look at nails/Polyneuropathy associated with underlying disease    Subjective: 62 y.o. female presenting today as a new patient for evaluation of thickening to the bilateral toenails that has been ongoing for several years.  Gradual onset.  She has tried topical antifungals without any improvement.  She is concerned about toenail fungus.  She presents for further treatment and evaluation  Past Medical History:  Diagnosis Date   Abnormal LFTs 04/06/2013   Very minor tried to reassure okay to repeat today with hepatitis C screen   Allergic rhinitis 03/26/2007   Autonomic dysfunction 01/11/2015   Bipolar 1 disorder    Hx of psychosis; present since age 31   Chest pain on breathing 03/16/2014   Chronic fatigue 06/21/2017   Chronic interstitial cystitis 11/15/2020   Community acquired pneumonia of right lung 08/17/2020   Complication of anesthesia    agitation   Constipation 05/17/2012   Costochondritis 02/14/2014   Diverticular disease of colon 08/06/2020   Double vision 03/15/2011   Dyspnea on effort 01/10/2011   Dysuria 02/15/2013   Early satiety 04/27/2013   Elevated lactic acid level    Endometriosis 05/11/2008   Fatty liver disease, nonalcoholic 02/13/2014   Fecal urgency 08/06/2020   Female stress incontinence 11/15/2020   Food aversion 11/08/2012   Frequency of urination    Gastroesophageal reflux disease 08/06/2020   Headache    History of duodenal ulcer    History of hysterectomy 11/15/2020   History of kidney stones    History of lithium toxicity 05/01/2014   Hoarseness of voice 02/27/2016   Hyperglycemia 01/10/2011   Hyperlipidemia, mixed 05/12/2017   Hyperprolactinemia 06/26/2015   Insomnia 05/12/2017   Irritable bowel syndrome 08/06/2020   Jaw pain 02/13/2012   poss tmj area     Left upper quadrant pain 08/06/2020   Leukocytosis 05/01/2014   Lump of breast, left 12/20/2012    about 6 oclock    Mass of urethra 11/15/2020   Mild cognitive impairment of uncertain or unknown etiology 10/16/2020   Multinodular goiter 05/12/2017   Nocturia    Nondiabetic gastroparesis 01/11/2015   Noninvasive follicular neoplasm of thyroid with papillary-like nuclear features 10/10/2021   Palpitations 12/08/2010   Pelvic pain    Periumbilical pain 08/06/2020   Plantar fasciitis 06/11/2007   Postsurgical hypothyroidism 09/03/2021   Pruritus ani 08/06/2020   Right ear pain 02/16/2012   Social anxiety disorder    Subacute bronchitis 07/10/2018   Type II diabetes mellitus 10/10/2021   Unstable gait 07/05/2022   Urgency of urination    Vitamin B12 deficiency 09/03/2021   Vitamin D deficiency 09/23/2011    Past Surgical History:  Procedure Laterality Date   CARDIAC CATHETERIZATION  10-09-1999  DR KATZ   NORMAL LVF/  NORMAL RCA/  NO CRITICAL DISEASE LEFT CORONARY SYSTEM   CARDIOVASCULAR STRESS TEST  01-23-2011   NORMAL NUCLEAR STUDY/ NO ISCHEMIA/ EF 81%   CYSTOSCOPY WITH BIOPSY N/A 06/03/2013   Procedure: CYSTOSCOPY WITH BIOPSY  INSTILLATION OF MARCAINE AND PYRIDIUM;  Surgeon: Lindaann Slough, MD;  Location: [facility];  Service: Urology;  Laterality: N/A;   LAPAROSCOPIC CHOLECYSTECTOMY  11-08-2001   LAPAROSCOPIC LEFT SALPINGOOPHORECTOMY AND LYSYS ADHESIONS  03-10-2002   LAPAROSCOPIC REMOVAL OVARY REMNANT  2003   [place]   LAPAROSCOPY N/A 12/14/2012   Procedure: LAPAROSCOPY DIAGNOSTIC;  Surgeon: Almond Lint, MD;  Location: [facility];  Service: General;  Laterality: N/A;   THYROIDECTOMY N/A 05/24/2021   Procedure: TOTAL THYROIDECTOMY;  Surgeon: Darnell Level, MD;  Location: [facility];  Service: General;  Laterality: N/A;   TOTAL ABDOMINAL HYSTERECTOMY  2000   W/ RIGHT SALPINGOOPHORECTOMY   TRANSTHORACIC ECHOCARDIOGRAM  10-13-2010   NORMAL LVF/  EF 55-60%    Allergies  Allergen Reactions   Morphine And Codeine Nausea And Vomiting and Nausea Only   Ambien  [Zolpidem] Other (See Comments)    Per patient this caused her to fall   Duloxetine Swelling and Other (See Comments)    Other reaction(s): SWELLING/EDEMA   Latuda [Lurasidone Hcl]    Morphine Nausea Only   Shingrix [Zoster Vac Recomb Adjuvanted]     Dizziness vomiting    03/30/2023  Objective: Physical Exam General: The patient is alert and oriented x3 in no acute distress.  Dermatology: Hyperkeratotic, discolored, thickened, onychodystrophy noted. Skin is warm, dry and supple bilateral lower extremities. Negative for open lesions or macerations.  Vascular: Palpable pedal pulses bilaterally. No edema or erythema noted. Capillary refill within normal limits.  Neurological: Grossly intact via light touch  Musculoskeletal Exam: No pedal deformity noted  Assessment: #1 Onychomycosis of toenails  Plan of Care:  #1 Patient was evaluated.  Mechanical debridement of nails 1-5 bilateral was performed using a nail nipper without incident or bleeding #2  Today we discussed different treatment options including oral, topical, and laser antifungal treatment modalities.  We discussed their efficacies and side effects.  Patient opts for oral antifungal treatment modality #3 prescription for Lamisil 250 mg #90 daily. Pt denies a history of liver pathology or symptoms.  CMP 10/03/2022 hepatic function WNL #4 return to clinic 3 months routine footcare   Felecia Shelling, DPM [facility]  Dr. Felecia Shelling, DPM    [address] [place], [place] [address]                Office [phone]  Fax [phone]

## 2023-04-02 DIAGNOSIS — F3131 Bipolar disorder, current episode depressed, mild: Secondary | ICD-10-CM | POA: Diagnosis not present

## 2023-04-07 DIAGNOSIS — F29 Unspecified psychosis not due to a substance or known physiological condition: Secondary | ICD-10-CM | POA: Diagnosis not present

## 2023-04-08 ENCOUNTER — Ambulatory Visit: Payer: Medicare PPO | Admitting: Neurology

## 2023-04-08 ENCOUNTER — Encounter: Payer: Self-pay | Admitting: Neurology

## 2023-04-08 VITALS — BP 120/73 | HR 72 | Ht 61.0 in | Wt 146.0 lb

## 2023-04-08 DIAGNOSIS — G3184 Mild cognitive impairment, so stated: Secondary | ICD-10-CM

## 2023-04-08 DIAGNOSIS — H8110 Benign paroxysmal vertigo, unspecified ear: Secondary | ICD-10-CM | POA: Diagnosis not present

## 2023-04-08 DIAGNOSIS — R296 Repeated falls: Secondary | ICD-10-CM

## 2023-04-08 NOTE — Progress Notes (Signed)
Follow-up Visit   Date: 04/08/23   [name] MRN: [id number] DOB: 1961/06/20   Interim History: [name] is a 62 y.o. Caucasian female with bipolar depression, anxiety, GERD, hyperlipidemia, and IBS returning to the clinic for follow-up of mild cognitive impairment.  The patient was accompanied to the clinic by partner who also provides collateral information.    History of present illness: Starting around 2015, she began having weakness of the upper legs which has been intermittent and became worse over the past year. Specifically, she has difficulty climbing stairs and walking for extended periods.  Sometimes, her partner states that she will walk even faster because she feels unsteady.   She does not use a cane.   She has suffered 10 falls over the past year, she usually needs help to stand up.  She has not had any ER visit due to injuries. She does not have numbness/tingling of the legs, low back pain, or pain in the legs.  She completed physical therapy about 6-9 months ago, but she does not appreciate much improvement.  Upon further discussion, her partner states that she had a psychotic episode about 5 years ago and everything has been worse since then. She feels scared and afraid to walk. She sees psychiatry and a Veterinary surgeon.  She is on disability for bipolar disease since this time.     In the past, she has been evaluated neurologists at [facility] and [facility] for various neurological symptoms, including weakness.  Imaging excluded possibility for multiple sclerosis and NCS/EMG with repetitive nerve stimulation excluded neuromuscular junction disorder.      UPDATE 10/15/2020:  Over the past 3 months, she has been falling more frequently.  She says that she looses her balance and falls without always being able to break her falls. She is not sure what causes her to fall.  She cannot tell whether her legs buckle, she is in pain, gets lightheaded, or trips.  She also complains  of memory loss and being forgetful frequently.  She manages her own medication, but admits to sometimes taking it twice. She does not use a pillbox, despite this being encouraged by her partner.   UPDATE 04/23/2022:  She is here for acute visit.  Her balance has been getting worse.  She has fallen 3 times this year.  She is able to stand up without assistance.  She tends to fall forwards, especially if leaning forward.  She typically will hold onto her partner for support.    She ER to the on 8/31 with acute onset of spinning dizziness and nausea/vomiting. She continues to have spells of spinning, which prompted another ER visit on 9/5. She had MRI neuroaxis which was normal.  Meclizine was prescribed, they have yet to pick it up. She has been having spells of dizziness all summer.  No preceding illness. During the end of August, they had a planned visit to [place] to celebrate their birthdays and the entire time, she would need to use a walker/wheelchair because of imbalance.    Partner also states that her behavior has changed.  She is agitated and tired.  Her memory is getting worse,especially short-term.  She forgets details of conversations, tasks, etc.  She is not driving due to concern of getting lost.  Neuropsychological testing from February 2022 shows MCI.  Her psychiatrist, Dr. Donell Beers, has requested that tested be repeated.   UPDATE 04/08/2023:  She is here for follow-up visit after having neuropsychological testing.  Findings were consistent with mild cognitive impairment, contributed by mood disorder.   Overall, she does not feel that there has been memory.  Her dizziness has improved and she rarely has spells of vertigo.  Her balance continues to be a problem.  She constantly feels unsteady.  She had one fall, no injuries.    Medications:  Current Outpatient Medications on File Prior to Visit  Medication Sig Dispense Refill   Accu-Chek Softclix Lancets lancets USE TO CHECK BLOOD SUGARS 1-2 TIMES  DAILY 200 each 2   acetaminophen (TYLENOL) 325 MG tablet Take 650 mg by mouth every 6 (six) hours as needed for mild pain, fever or headache.     albuterol (VENTOLIN HFA) 108 (90 Base) MCG/ACT inhaler Inhale 2 puffs into the lungs every 6 (six) hours as needed for wheezing or shortness of breath. 18 g 1   Alcohol Swabs (ALCOHOL PREP) PADS To use for blood sugar checks. 100 each 3   aspirin-acetaminophen-caffeine (EXCEDRIN MIGRAINE) 250-250-65 MG tablet Take 2 tablets by mouth every 6 (six) hours as needed for headache.     AUVELITY 45-105 MG TBCR Take 1 tablet by mouth every morning.     Blood Glucose Monitoring Suppl (ONE TOUCH ULTRA 2) w/Device KIT Use to test blood sugars 1-2 times daily. 1 kit 0   calcium carbonate (TUMS) 500 MG chewable tablet Chew 2 tablets (400 mg of elemental calcium total) by mouth 3 (three) times daily. 90 tablet 1   divalproex (DEPAKOTE ER) 250 MG 24 hr tablet Take 750 mg by mouth at bedtime.     glucose blood (ACCU-CHEK GUIDE) test strip Use to test blood sugars 1-2 times daily. 200 each 2   levothyroxine (SYNTHROID) 100 MCG tablet Take 1 tablet (100 mcg total) by mouth daily. 90 tablet 3   meclizine (ANTIVERT) 25 MG tablet Take 1 tablet (25 mg total) by mouth 3 (three) times daily as needed for dizziness. 30 tablet 0   metFORMIN (GLUCOPHAGE-XR) 500 MG 24 hr tablet Take 1 tablet (500 mg total) by mouth daily with breakfast. 90 tablet 3   Multiple Vitamin (MULTIVITAMIN WITH MINERALS) TABS tablet Take 1 tablet by mouth daily.     ondansetron (ZOFRAN) 4 MG tablet Take 1 tablet (4 mg total) by mouth every 8 (eight) hours as needed for nausea or vomiting. 20 tablet 0   pravastatin (PRAVACHOL) 20 MG tablet Take 1 tablet by mouth once daily 90 tablet 2   terbinafine (LAMISIL) 250 MG tablet Take 1 tablet (250 mg total) by mouth daily. 90 tablet 0   No current facility-administered medications on file prior to visit.    Allergies:  Allergies  Allergen Reactions   Morphine  And Codeine Nausea And Vomiting and Nausea Only   Ambien [Zolpidem] Other (See Comments)    Per patient this caused her to fall   Duloxetine Swelling and Other (See Comments)    Other reaction(s): SWELLING/EDEMA   Latuda [Lurasidone Hcl]    Morphine Nausea Only   Shingrix [Zoster Vac Recomb Adjuvanted]     Dizziness vomiting    Vital Signs:  BP 120/73   Pulse 72   Ht 5\' 1"  (1.549 m)   Wt 146 lb (66.2 kg)   SpO2 97%   BMI 27.59 kg/m   Neurological Exam: MENTAL STATUS including orientation to time, place, person, recent and remote memory, attention span and concentration, language, and fund of knowledge is fair.  Speech is not dysarthric.       04/23/2022  11:17 AM 04/23/2022   11:00 AM  Montreal Cognitive Assessment   Visuospatial/ Executive (0/5) 2 2  Naming (0/3) 3 3  Attention: Read list of digits (0/2) 2 2  Attention: Read list of letters (0/1) 1 1  Attention: Serial 7 subtraction starting at 100 (0/3) 3 3  Language: Repeat phrase (0/2) 2 2  Language : Fluency (0/1) 0 0  Abstraction (0/2) 2 2  Delayed Recall (0/5) 3 3  Orientation (0/6) 6 6  Total 24 24  Adjusted Score (based on education) 24 24    CRANIAL NERVES:  Pupils round and reactive bilaterally.  Extraocular muscles intact.  There is mild end-gaze nystagmus on the right.  No ptosis.  Face is symmetric. Tongue is midline.  MOTOR:  Motor strength is 5/5 in all extremities.  No atrophy, fasciculations or abnormal movements.  No pronator drift.  Tone is normal.    MSRs:  Reflexes are 3+/4 throughout, and 2+/4 at the ankles.  SENSORY:  Intact to vibration and temperature throughout.  Rhomberg testing is positive.   COORDINATION/GAIT:  Normal finger-to- nose-finger.  Intact rapid alternating movements bilaterally.  Gait wide-based, stable unassisted.   Data: MRI brain wo contrast 04/22/2022: 1. No acute finding or specific cause for symptoms. 2. Generalized volume loss.  Mild chronic small vessel  ischemia.  MRI cervical, thoracic, and lumbar spine 04/22/2022: 1. No explanation for symptoms.  Normal appearance of the cord. 2. Less than typical degeneration seen in the lower lumbar spine. Noneural impingement throughout the spine.  MRI brain 09/23/2014:  Abnormal MRI scan of the brain showing tiny bilateral subcortical and periventricular nonspecific white matter hyperintensities with the differential discussed above. No enhancing lesions are noted.   MRI cervical and thoracic spine wwo contrast 06/11/2011:  Negative cervical and thoracic spine MRI.  No evidence of multiple sclerosis. Central canal and foramina widely patent all levels.   NCS/EMG of the right arm and leg 09/21/2013:  Nerve conduction studies done on both upper extremities and on the right lower extremity were normal. No evidence of a peripheral neuropathy is seen. EMG evaluation was not done. Repetitive nerve stimulation on the right hand was done at 3 Hz stimulation, and again at 40 Hz stimulation without evidence of a neuromuscular transmission disorder. Repetitive nerve stimulation studies were completely normal.  Neuropsychological testing 12/30/2022:  Mild cognitive impairment  IMPRESSION/PLAN: Mild cognitive impairment, contributed by mood disorder.  Results of neuropsychological testing discussed which continues to show MCI.  Compared to 2022, there was some improvement in some areas, no objective decline. Her exam does not have any features of parkinsonism and given no progressive cognitive issues, we mutually decided to consider FDG-PET only if symptoms get worse.  Benign paroxysmal peripheral vertigo, improved with vestibular therapy.  Falls, unclear etiology.  No evidence of weakness, neurodegenerative condition, or neuropathy on her exam. Start PT for balance training.   Return to clinic as needed  Thank you for allowing me to participate in patient's care.  If I can answer any additional questions, I would be pleased  to do so.    Sincerely,    Conleigh Heinlein K. Allena Katz, DO

## 2023-04-08 NOTE — Patient Instructions (Signed)
Start physical therapy for balance training  Return to clinic as needed

## 2023-04-11 ENCOUNTER — Other Ambulatory Visit: Payer: Self-pay | Admitting: Family Medicine

## 2023-04-11 DIAGNOSIS — E782 Mixed hyperlipidemia: Secondary | ICD-10-CM

## 2023-04-24 ENCOUNTER — Telehealth: Payer: Self-pay

## 2023-04-24 NOTE — Telephone Encounter (Signed)
Patient has been advised and needs to be placed on a waiting list for sooner appointment. Please contact patient spouse when calling for the wait list

## 2023-04-24 NOTE — Telephone Encounter (Signed)
Patient would like to know if she can stop the Metformin and try something else due to the diarrhea.

## 2023-05-01 ENCOUNTER — Ambulatory Visit: Payer: Medicare PPO | Admitting: Internal Medicine

## 2023-05-01 ENCOUNTER — Encounter: Payer: Self-pay | Admitting: Internal Medicine

## 2023-05-01 VITALS — BP 110/68 | HR 78 | Ht 61.0 in | Wt 144.0 lb

## 2023-05-01 DIAGNOSIS — E890 Postprocedural hypothyroidism: Secondary | ICD-10-CM

## 2023-05-01 DIAGNOSIS — K529 Noninfective gastroenteritis and colitis, unspecified: Secondary | ICD-10-CM | POA: Diagnosis not present

## 2023-05-01 DIAGNOSIS — E1169 Type 2 diabetes mellitus with other specified complication: Secondary | ICD-10-CM | POA: Diagnosis not present

## 2023-05-01 LAB — POCT GLYCOSYLATED HEMOGLOBIN (HGB A1C): Hemoglobin A1C: 6 % — AB (ref 4.0–5.6)

## 2023-05-01 LAB — COMPREHENSIVE METABOLIC PANEL
ALT: 18 U/L (ref 0–35)
AST: 19 U/L (ref 0–37)
Albumin: 4 g/dL (ref 3.5–5.2)
Alkaline Phosphatase: 67 U/L (ref 39–117)
BUN: 14 mg/dL (ref 6–23)
CO2: 26 meq/L (ref 19–32)
Calcium: 9.8 mg/dL (ref 8.4–10.5)
Chloride: 105 meq/L (ref 96–112)
Creatinine, Ser: 0.87 mg/dL (ref 0.40–1.20)
GFR: 71.96 mL/min (ref >60.00)
Glucose, Bld: 103 mg/dL — ABNORMAL HIGH (ref 70–99)
Potassium: 4.2 meq/L (ref 3.5–5.1)
Sodium: 140 meq/L (ref 135–145)
Total Bilirubin: 0.7 mg/dL (ref 0.2–1.2)
Total Protein: 7 g/dL (ref 6.0–8.3)

## 2023-05-01 LAB — T4, FREE: Free T4: 1.01 ng/dL (ref 0.60–1.60)

## 2023-05-01 LAB — POCT GLUCOSE (DEVICE FOR HOME USE): POC Glucose: 139 mg/dL — AB (ref 70–99)

## 2023-05-01 LAB — TSH: TSH: 2.04 u[IU]/mL (ref 0.35–5.50)

## 2023-05-01 MED ORDER — LEVOTHYROXINE SODIUM 100 MCG PO TABS
100.0000 ug | ORAL_TABLET | Freq: Every day | ORAL | 3 refills | Status: DC
Start: 2023-05-01 — End: 2023-07-02

## 2023-05-01 NOTE — Progress Notes (Signed)
Name: [name]  MRN/ DOB: [id number], 06-Mar-1961   Age/ Sex: 62 y.o., female    PCP: [place], [name], MD   Reason for Endocrinology Evaluation: Type 2 Diabetes Mellitus     Date of Initial Endocrinology Visit: 10/10/2021    PATIENT IDENTIFIER: [name] is a 62 y.o. female with a past medical history of T2DM, dyslipidemia hypothyroidism, mild cognitive impairment. The patient presented for initial endocrinology clinic visit on 10/10/2021 for consultative assistance with her diabetes management.    HPI: Ms. Meddings    Diagnosed with DM 09/2021              Hemoglobin A1c has ranged from 5.0% in 2022, peaking at 6.9% in 2023.  On her initial visit with me she had an A1c of 6.9%, she was on metformin only   Despite having chronic diarrhea that per patient was started before initiation of metformin, she opted to discontinue metformin by 04/2023 due to worsening diarrhea.  A1c at the time 6.0% and we opted to remain off glycemic agents   THYROID HISTORY :  She is S/P total thyroidectomy due to enlarging MNG with pathology report consistent with NIFTP on 05/24/2021.  Surgical resection is curative.  Father with thyroid cancer   On disability for Bipolar d/o    SUBJECTIVE:   During the last visit (12/29/2022): A1c 5.8%     Today (05/01/23): [name] is here for follow-up on diabetes management of thyroid disease.  She is accompanied by her spouse Danford Bad.  She checks blood sugars 1x daily. The patient has not had hypoglycemic episodes since the last clinic visit  She was diagnosed with mild cognitive impairment following neurophysiological testing 03/2023 through neurology She had a follow-up with podiatry 03/30/2023 Denies nausea or vomiting , continues with chronic diarrhea , which was there prior to starting metformin, I had decreased her metformin to 1 tablet, but the patient contacted our office on 04/24/2023 wondering if there is alternative to  metformin altogether  Diarrhea has worsened , she is on imodium , follows with Dr. Loreta Ave  Stopped Metformin last week, which improved   She has been sluggish   Denies nausea or vomiting  Has noted weight loss    HOME ENDOCRINE REGIMEN: Metformin 500 mg daily -not taking Levothyroxine 100 mcg daily    Statin: yes ACE-I/ARB: no Prior Diabetic Education: 03/27/2022   METER DOWNLOAD SUMMARY: n/a      DIABETIC COMPLICATIONS: Microvascular complications:   Denies: CKD, neuropathy  Last eye exam: Completed a while ago   Macrovascular complications:   Denies: CAD, PVD, CVA   PAST HISTORY: Past Medical History:  Past Medical History:  Diagnosis Date   Abnormal LFTs 04/06/2013   Very minor tried to reassure okay to repeat today with hepatitis C screen   Allergic rhinitis 03/26/2007   Autonomic dysfunction 01/11/2015   Bipolar 1 disorder    Hx of psychosis; present since age 11   Chest pain on breathing 03/16/2014   Chronic fatigue 06/21/2017   Chronic interstitial cystitis 11/15/2020   Community acquired pneumonia of right lung 08/17/2020   Complication of anesthesia    agitation   Constipation 05/17/2012   Costochondritis 02/14/2014   Diverticular disease of colon 08/06/2020   Double vision 03/15/2011   Dyspnea on effort 01/10/2011   Dysuria 02/15/2013   Early satiety 04/27/2013   Elevated lactic acid level    Endometriosis 05/11/2008   Fatty liver disease, nonalcoholic 02/13/2014   Fecal  urgency 08/06/2020   Female stress incontinence 11/15/2020   Food aversion 11/08/2012   Frequency of urination    Gastroesophageal reflux disease 08/06/2020   Headache    History of duodenal ulcer    History of hysterectomy 11/15/2020   History of kidney stones    History of lithium toxicity 05/01/2014   Hoarseness of voice 02/27/2016   Hyperglycemia 01/10/2011   Hyperlipidemia, mixed 05/12/2017   Hyperprolactinemia 06/26/2015   Insomnia 05/12/2017   Irritable bowel  syndrome 08/06/2020   Jaw pain 02/13/2012   poss tmj area     Left upper quadrant pain 08/06/2020   Leukocytosis 05/01/2014   Lump of breast, left 12/20/2012   about 6 oclock    Mass of urethra 11/15/2020   Mild cognitive impairment of uncertain or unknown etiology 10/16/2020   Multinodular goiter 05/12/2017   Nocturia    Nondiabetic gastroparesis 01/11/2015   Noninvasive follicular neoplasm of thyroid with papillary-like nuclear features 10/10/2021   Palpitations 12/08/2010   Pelvic pain    Periumbilical pain 08/06/2020   Plantar fasciitis 06/11/2007   Postsurgical hypothyroidism 09/03/2021   Pruritus ani 08/06/2020   Right ear pain 02/16/2012   Social anxiety disorder    Subacute bronchitis 07/10/2018   Type II diabetes mellitus 10/10/2021   Unstable gait 07/05/2022   Urgency of urination    Vitamin B12 deficiency 09/03/2021   Vitamin D deficiency 09/23/2011   Past Surgical History:  Past Surgical History:  Procedure Laterality Date   CARDIAC CATHETERIZATION  10-09-1999  DR KATZ   NORMAL LVF/  NORMAL RCA/  NO CRITICAL DISEASE LEFT CORONARY SYSTEM   CARDIOVASCULAR STRESS TEST  01-23-2011   NORMAL NUCLEAR STUDY/ NO ISCHEMIA/ EF 81%   CYSTOSCOPY WITH BIOPSY N/A 06/03/2013   Procedure: CYSTOSCOPY WITH BIOPSY  INSTILLATION OF MARCAINE AND PYRIDIUM;  Surgeon: Lindaann Slough, MD;  Location:  SURGERY CENTER;  Service: Urology;  Laterality: N/A;   LAPAROSCOPIC CHOLECYSTECTOMY  11-08-2001   LAPAROSCOPIC LEFT SALPINGOOPHORECTOMY AND LYSYS ADHESIONS  03-10-2002   LAPAROSCOPIC REMOVAL OVARY REMNANT  2003   [place]   LAPAROSCOPY N/A 12/14/2012   Procedure: LAPAROSCOPY DIAGNOSTIC;  Surgeon: Almond Lint, MD;  Location: [facility];  Service: General;  Laterality: N/A;   THYROIDECTOMY N/A 05/24/2021   Procedure: TOTAL THYROIDECTOMY;  Surgeon: Darnell Level, MD;  Location: [facility];  Service: General;  Laterality: N/A;   TOTAL ABDOMINAL HYSTERECTOMY  2000   W/ RIGHT  SALPINGOOPHORECTOMY   TRANSTHORACIC ECHOCARDIOGRAM  10-13-2010   NORMAL LVF/  EF 55-60%    Social History:  reports that she quit smoking about 44 years ago. Her smoking use included cigarettes. She started smoking about 45 years ago. She has never used smokeless tobacco. She reports that she does not currently use alcohol. She reports that she does not use drugs. Family History:  Family History  Problem Relation Age of Onset   Sleep apnea Father    Depression Father    Alcohol abuse Father    Hypertension Father    Migraines Sister        headaches   Anxiety disorder Sister    Other Mother        MAC infection   Anxiety disorder Mother    Dementia Mother        likely Alzheimer's disease; symptom onset on late 70s/early 80s   Heart disease Paternal Grandmother    Hyperlipidemia Paternal Grandmother    Alcohol abuse Paternal Grandfather    Diabetes Neg Hx  HOME MEDICATIONS: Allergies as of 05/01/2023       Reactions   Morphine And Codeine Nausea And Vomiting, Nausea Only   Ambien [zolpidem] Other (See Comments)   Per patient this caused her to fall   Duloxetine Swelling, Other (See Comments)   Other reaction(s): SWELLING/EDEMA   Latuda [lurasidone Hcl]    Morphine Nausea Only   Shingrix [zoster Vac Recomb Adjuvanted]    Dizziness vomiting        Medication List        Accurate as of May 01, 2023 10:12 AM. If you have any questions, ask your nurse or doctor.          Accu-Chek Guide test strip Generic drug: glucose blood Use to test blood sugars 1-2 times daily.   Accu-Chek Softclix Lancets lancets USE TO CHECK BLOOD SUGARS 1-2 TIMES DAILY   acetaminophen 325 MG tablet Commonly known as: TYLENOL Take 650 mg by mouth every 6 (six) hours as needed for mild pain, fever or headache.   albuterol 108 (90 Base) MCG/ACT inhaler Commonly known as: VENTOLIN HFA Inhale 2 puffs into the lungs every 6 (six) hours as needed for wheezing or shortness of  breath.   Alcohol Prep Pads To use for blood sugar checks.   aspirin-acetaminophen-caffeine 250-250-65 MG tablet Commonly known as: EXCEDRIN MIGRAINE Take 2 tablets by mouth every 6 (six) hours as needed for headache.   Auvelity 45-105 MG Tbcr Generic drug: Dextromethorphan-buPROPion ER Take 1 tablet by mouth every morning.   calcium carbonate 500 MG chewable tablet Commonly known as: Tums Chew 2 tablets (400 mg of elemental calcium total) by mouth 3 (three) times daily.   divalproex 250 MG 24 hr tablet Commonly known as: DEPAKOTE ER Take 750 mg by mouth at bedtime.   levothyroxine 100 MCG tablet Commonly known as: SYNTHROID Take 1 tablet (100 mcg total) by mouth daily.   meclizine 25 MG tablet Commonly known as: ANTIVERT Take 1 tablet (25 mg total) by mouth 3 (three) times daily as needed for dizziness.   metFORMIN 500 MG 24 hr tablet Commonly known as: GLUCOPHAGE-XR Take 1 tablet (500 mg total) by mouth daily with breakfast.   multivitamin with minerals Tabs tablet Take 1 tablet by mouth daily.   ondansetron 4 MG tablet Commonly known as: Zofran Take 1 tablet (4 mg total) by mouth every 8 (eight) hours as needed for nausea or vomiting.   ONE TOUCH ULTRA 2 w/Device Kit Use to test blood sugars 1-2 times daily.   pravastatin 20 MG tablet Commonly known as: PRAVACHOL Take 1 tablet by mouth once daily   terbinafine 250 MG tablet Commonly known as: LamISIL Take 1 tablet (250 mg total) by mouth daily.         ALLERGIES: Allergies  Allergen Reactions   Morphine And Codeine Nausea And Vomiting and Nausea Only   Ambien [Zolpidem] Other (See Comments)    Per patient this caused her to fall   Duloxetine Swelling and Other (See Comments)    Other reaction(s): SWELLING/EDEMA   Latuda [Lurasidone Hcl]    Morphine Nausea Only   Shingrix [Zoster Vac Recomb Adjuvanted]     Dizziness vomiting     REVIEW OF SYSTEMS: A comprehensive ROS was conducted with the  patient and is negative except as per HPI     OBJECTIVE:   VITAL SIGNS: BP 110/68 (BP Location: Left Arm, Patient Position: Sitting, Cuff Size: Small)   Pulse 78   Ht 5\' 1"  (1.549 m)  Wt 144 lb (65.3 kg)   SpO2 97%   BMI 27.21 kg/m    PHYSICAL EXAM:  General: Pt appears well and is in NAD  Neck: General: Supple without adenopathy or carotid bruits. Thyroid:.  No goiter or nodules appreciated.   Lungs: Clear with good BS bilat   Heart: RRR   Extremities:  Lower extremities - No pretibial edema  Neuro: MS is good with appropriate affect, pt is alert and Ox3    DM foot exam: 03/30/2023 per podiatry  The skin of the feet is intact without sores or ulcerations. The pedal pulses are undetectable  The sensation is absent  to a screening 5.07, 10 gram monofilament bilaterally  DATA REVIEWED:  Lab Results  Component Value Date   HGBA1C 6.0 (A) 05/01/2023   HGBA1C 5.8 (A) 12/29/2022   HGBA1C 5.7 07/04/2022    Latest Reference Range & Units 05/01/23 10:31  Sodium 135 - 145 mEq/L 140  Potassium 3.5 - 5.1 mEq/L 4.2  Chloride 96 - 112 mEq/L 105  CO2 19 - 32 mEq/L 26  Glucose 70 - 99 mg/dL 782 (H)  BUN 6 - 23 mg/dL 14  Creatinine 9.56 - 2.13 mg/dL 0.86  Calcium 8.4 - 57.8 mg/dL 9.8  Alkaline Phosphatase 39 - 117 U/L 67  Albumin 3.5 - 5.2 g/dL 4.0  AST 0 - 37 U/L 19  ALT 0 - 35 U/L 18  Total Protein 6.0 - 8.3 g/dL 7.0  Total Bilirubin 0.2 - 1.2 mg/dL 0.7  GFR >46.96 mL/min 71.96    Latest Reference Range & Units 05/01/23 10:31  TSH 0.35 - 5.50 uIU/mL 2.04  T4,Free(Direct) 0.60 - 1.60 ng/dL 2.95        Thyroid Pathology 05/24/2021  FINAL MICROSCOPIC DIAGNOSIS:   A. THYROID, TOTAL THYROIDECTOMY:  -  Noninvasive follicular thyroid neoplasm with papillary-like nuclear  features (NIFTP, 0.15 cm, left)  -  Follicular adenoma (5.1 cm, right)    Old records , labs and images have been reviewed.    ASSESSMENT / PLAN / RECOMMENDATIONS:   1) Type 2 Diabetes  Mellitus, optimally controlled, With out complications - Most recent A1c of 6.0%. Goal A1c < 7.0 %.     -Patient discontinued metformin due to worsening of chronic diarrhea -We have opted to remain off any glycemic agents due to an A1c of 6.0% -In the future we may consider SGLT2 inhibitors or sulfonylurea, I do not think GLP-1 agonist are an option with chronic diarrhea at this time -CMP normal  MEDICATIONS: Stop metformin  EDUCATION / INSTRUCTIONS: BG monitoring instructions: Patient is instructed to check her blood sugars 3 times a week  2) Diabetic complications:  Eye: Does not have known diabetic retinopathy.   Neuro/ Feet: Does not have known diabetic peripheral neuropathy. Renal: Patient does not have known baseline CKD. She is not on an ACEI/ARB at present.    3)Postoperative Hypothyroidism :  - TSH remains normal - Pt educated extensively on the correct way to take levothyroxine (first thing in the morning with water, 30 minutes before eating or taking other medications). - Pt encouraged to double dose the following day if she were to miss a dose given long half-life of levothyroxine.  Medication  Continue levothyroxine 100 mcg daily    4) NIFTP: - Surgical resection is curative  - NO indication for RAI ablation nor TSH suppression     Follow-up in 6 months  Signed electronically by: Lyndle Herrlich, MD  [facility] Endocrinology  [facility] [address], [address] [place], [place] [address] Phone: [phone] FAX: [phone]   CC: [place], [name], MD [address] [place] [address] Phone: [phone]  Fax: [phone]    Return to Endocrinology clinic as below: Future Appointments  Date Time Provider Department Center  06/29/2023 10:45 AM Felecia Shelling, DPM [facility]  07/01/2023  9:50 AM Lunette Tapp, Konrad Dolores, MD [facility] None  01/22/2024  2:00 PM [facility] WELLNESS VISIT [facility]

## 2023-05-07 DIAGNOSIS — K9089 Other intestinal malabsorption: Secondary | ICD-10-CM | POA: Diagnosis not present

## 2023-05-07 DIAGNOSIS — F32A Depression, unspecified: Secondary | ICD-10-CM | POA: Diagnosis not present

## 2023-05-07 DIAGNOSIS — K5730 Diverticulosis of large intestine without perforation or abscess without bleeding: Secondary | ICD-10-CM | POA: Diagnosis not present

## 2023-05-07 DIAGNOSIS — K449 Diaphragmatic hernia without obstruction or gangrene: Secondary | ICD-10-CM | POA: Diagnosis not present

## 2023-05-08 ENCOUNTER — Ambulatory Visit (INDEPENDENT_AMBULATORY_CARE_PROVIDER_SITE_OTHER): Payer: Medicare PPO | Admitting: Family Medicine

## 2023-05-08 ENCOUNTER — Encounter: Payer: Self-pay | Admitting: Family Medicine

## 2023-05-08 VITALS — BP 100/70 | HR 75 | Resp 16 | Ht 61.0 in | Wt 146.0 lb

## 2023-05-08 DIAGNOSIS — R079 Chest pain, unspecified: Secondary | ICD-10-CM

## 2023-05-08 DIAGNOSIS — R61 Generalized hyperhidrosis: Secondary | ICD-10-CM | POA: Diagnosis not present

## 2023-05-08 DIAGNOSIS — G3184 Mild cognitive impairment, so stated: Secondary | ICD-10-CM | POA: Diagnosis not present

## 2023-05-08 DIAGNOSIS — R030 Elevated blood-pressure reading, without diagnosis of hypertension: Secondary | ICD-10-CM | POA: Diagnosis not present

## 2023-05-08 NOTE — Assessment & Plan Note (Signed)
Per wife re[ort, problem has been stable and she is to continue to follow with neurologist annually, before if needed.

## 2023-05-08 NOTE — Patient Instructions (Addendum)
A few things to remember from today's visit:  Elevated blood pressure reading  Chest pain, unspecified type - Plan: EKG 12-Lead, Ambulatory referral to Cardiology  Diaphoresis - Plan: EKG 12-Lead  Monitor for new symptoms and try to document chest pain when it happens, what were you doing,and if there is any associated symptoms to provide information to cardiologist during visit. Monitor blood pressure at home in the morning after getting up and emptying your bladder and at night before supper. Let me know about blood pressure readings in 2 weeks.  If you need refills for medications you take chronically, please call your pharmacy. Do not use My Chart to request refills or for acute issues that need immediate attention. If you send a my chart message, it may take a few days to be addressed, specially if I am not in the office.  Please be sure medication list is accurate. If a new problem present, please set up appointment sooner than planned today.

## 2023-05-08 NOTE — Progress Notes (Unsigned)
ACUTE VISIT Chief Complaint  Patient presents with   Hypertension    It was high yesterday in Dr. Kenna Gilbert office 140/90s. Pt is having some chest pain.    HPI: [name] is a 62 y.o. female with a PMHx significant for HLD, DM2, bipolar disorder, and mild cognitive impairment here today with his wife complaining of recent elevated bloop pressure and reporting episodes of chest pain.   She was seen by her gastroenterologist yesterday, where her BP was 142/92. BP was normal at past visits with neurologist and endocrinologist.  She has not been checking at home because she doesn't have a monitor.  She has not been taking any new medications or OTC supplements. No hx of HTN.  She is also concerned about mid CP. She describes as a "squeezing" sensation in the middle of her chest last week. She is not sure about exacerbating or alleviating factors.  Chest Pain  This is a new problem. The current episode started in the past 7 days. The problem occurs intermittently. The problem has been waxing and waning. The pain is present in the substernal region. Associated symptoms include diaphoresis and malaise/fatigue. Pertinent negatives include no abdominal pain, cough, dizziness, fever, irregular heartbeat, lower extremity edema, near-syncope, numbness, orthopnea, palpitations, PND, shortness of breath, sputum production, syncope or vomiting. The pain is aggravated by nothing. She has tried NSAIDs for the symptoms. The treatment provided moderate relief. Risk factors include sedentary lifestyle.  Her past medical history is significant for diabetes and hyperlipidemia. Prior diagnostic workup includes chest x-ray.   She is not sure about duration, resolves after taking Aspirin 81 mg.  She denies radiation of pain to her arm or neck. Negative for heartburn and nausea.   Her last EKG was in 04/2022 She also mentions episodes of diaphoresis night and during the day, sometimes happens with  exertion and not always associated with the chest pain.   She endorses some recent diarrhea, for which she has seen GI.  Lab Results  Component Value Date   TSH 2.04 05/01/2023   Lab Results  Component Value Date   WBC 7.3 04/21/2022   HGB 14.6 04/21/2022   HCT 44.2 04/21/2022   MCV 96.9 04/21/2022   PLT 233 04/21/2022   Lab Results  Component Value Date   NA 140 05/01/2023   CL 105 05/01/2023   K 4.2 05/01/2023   CO2 26 05/01/2023   BUN 14 05/01/2023   CREATININE 0.87 05/01/2023   GFR 71.96 05/01/2023   CALCIUM 9.8 05/01/2023   PHOS 2.8 05/01/2014   ALBUMIN 4.0 05/01/2023   GLUCOSE 103 (H) 05/01/2023   Lab Results  Component Value Date   ALT 18 05/01/2023   AST 19 05/01/2023   ALKPHOS 67 05/01/2023   BILITOT 0.7 05/01/2023   She also reports worsening memory issues for which she has been seeing neurology. According to her wife , problem has been stable for the past year, so annual follow ups were recommended.  Bipolar disorder: She follows with psychiatry every 3 months.   Review of Systems  Constitutional:  Positive for diaphoresis, fatigue and malaise/fatigue. Negative for fever.  HENT:  Negative for nosebleeds and sore throat.   Respiratory:  Negative for cough, sputum production, shortness of breath and wheezing.   Cardiovascular:  Positive for chest pain. Negative for palpitations, orthopnea, leg swelling, syncope, PND and near-syncope.  Gastrointestinal:  Negative for abdominal pain and vomiting.  Endocrine: Negative for cold intolerance and heat intolerance.  Genitourinary:  Negative for decreased urine volume, dysuria and hematuria.  Musculoskeletal:  Positive for arthralgias and gait problem.  Skin:  Negative for rash.  Neurological:  Negative for dizziness, syncope and numbness.  Psychiatric/Behavioral:  Negative for hallucinations.   See other pertinent positives and negatives in HPI.  Current Outpatient Medications on File Prior to Visit   Medication Sig Dispense Refill   Accu-Chek Softclix Lancets lancets USE TO CHECK BLOOD SUGARS 1-2 TIMES DAILY 200 each 2   acetaminophen (TYLENOL) 325 MG tablet Take 650 mg by mouth every 6 (six) hours as needed for mild pain, fever or headache.     Alcohol Swabs (ALCOHOL PREP) PADS To use for blood sugar checks. 100 each 3   aspirin-acetaminophen-caffeine (EXCEDRIN MIGRAINE) 250-250-65 MG tablet Take 2 tablets by mouth every 6 (six) hours as needed for headache.     AUVELITY 45-105 MG TBCR Take 1 tablet by mouth every morning.     Blood Glucose Monitoring Suppl (ONE TOUCH ULTRA 2) w/Device KIT Use to test blood sugars 1-2 times daily. 1 kit 0   divalproex (DEPAKOTE ER) 250 MG 24 hr tablet Take 750 mg by mouth at bedtime.     glucose blood (ACCU-CHEK GUIDE) test strip Use to test blood sugars 1-2 times daily. 200 each 2   levothyroxine (SYNTHROID) 100 MCG tablet Take 1 tablet (100 mcg total) by mouth daily. 90 tablet 3   meclizine (ANTIVERT) 25 MG tablet Take 1 tablet (25 mg total) by mouth 3 (three) times daily as needed for dizziness. 30 tablet 0   Multiple Vitamin (MULTIVITAMIN WITH MINERALS) TABS tablet Take 1 tablet by mouth daily.     ondansetron (ZOFRAN) 4 MG tablet Take 1 tablet (4 mg total) by mouth every 8 (eight) hours as needed for nausea or vomiting. 20 tablet 0   pravastatin (PRAVACHOL) 20 MG tablet Take 1 tablet by mouth once daily 90 tablet 1   terbinafine (LAMISIL) 250 MG tablet Take 1 tablet (250 mg total) by mouth daily. 90 tablet 0   albuterol (VENTOLIN HFA) 108 (90 Base) MCG/ACT inhaler Inhale 2 puffs into the lungs every 6 (six) hours as needed for wheezing or shortness of breath. 18 g 1   No current facility-administered medications on file prior to visit.    Past Medical History:  Diagnosis Date   Abnormal LFTs 04/06/2013   Very minor tried to reassure okay to repeat today with hepatitis C screen   Allergic rhinitis 03/26/2007   Autonomic dysfunction 01/11/2015    Bipolar 1 disorder    Hx of psychosis; present since age 69   Chest pain on breathing 03/16/2014   Chronic fatigue 06/21/2017   Chronic interstitial cystitis 11/15/2020   Community acquired pneumonia of right lung 08/17/2020   Complication of anesthesia    agitation   Constipation 05/17/2012   Costochondritis 02/14/2014   Diverticular disease of colon 08/06/2020   Double vision 03/15/2011   Dyspnea on effort 01/10/2011   Dysuria 02/15/2013   Early satiety 04/27/2013   Elevated lactic acid level    Endometriosis 05/11/2008   Fatty liver disease, nonalcoholic 02/13/2014   Fecal urgency 08/06/2020   Female stress incontinence 11/15/2020   Food aversion 11/08/2012   Frequency of urination    Gastroesophageal reflux disease 08/06/2020   Headache    History of duodenal ulcer    History of hysterectomy 11/15/2020   History of kidney stones    History of lithium toxicity 05/01/2014   Hoarseness of voice 02/27/2016  Hyperglycemia 01/10/2011   Hyperlipidemia, mixed 05/12/2017   Hyperprolactinemia 06/26/2015   Insomnia 05/12/2017   Irritable bowel syndrome 08/06/2020   Jaw pain 02/13/2012   poss tmj area     Left upper quadrant pain 08/06/2020   Leukocytosis 05/01/2014   Lump of breast, left 12/20/2012   about 6 oclock    Mass of urethra 11/15/2020   Mild cognitive impairment of uncertain or unknown etiology 10/16/2020   Multinodular goiter 05/12/2017   Nocturia    Nondiabetic gastroparesis 01/11/2015   Noninvasive follicular neoplasm of thyroid with papillary-like nuclear features 10/10/2021   Palpitations 12/08/2010   Pelvic pain    Periumbilical pain 08/06/2020   Plantar fasciitis 06/11/2007   Postsurgical hypothyroidism 09/03/2021   Pruritus ani 08/06/2020   Right ear pain 02/16/2012   Social anxiety disorder    Subacute bronchitis 07/10/2018   Type II diabetes mellitus 10/10/2021   Unstable gait 07/05/2022   Urgency of urination    Vitamin B12 deficiency  09/03/2021   Vitamin D deficiency 09/23/2011   Allergies  Allergen Reactions   Morphine And Codeine Nausea And Vomiting and Nausea Only   Ambien [Zolpidem] Other (See Comments)    Per patient this caused her to fall   Duloxetine Swelling and Other (See Comments)    Other reaction(s): SWELLING/EDEMA   Latuda [Lurasidone Hcl]    Morphine Nausea Only   Shingrix [Zoster Vac Recomb Adjuvanted]     Dizziness vomiting    Social History   Socioeconomic History   Marital status: Married    Spouse name: Not on file   Number of children: 0   Years of education: 16   Highest education level: Bachelor's degree (e.g., BA, AB, BS)  Occupational History   Occupation: Disability    Comment: Nurse  Tobacco Use   Smoking status: Former    Current packs/day: 0.00    Types: Cigarettes    Start date: 1979    Quit date: 1980    Years since quitting: 44.7   Smokeless tobacco: Never   Tobacco comments:    ONLY SMOKED FOR 6 MONTHS --  QUIT  YRS AGO  Vaping Use   Vaping status: Never Used  Substance and Sexual Activity   Alcohol use: Not Currently    Comment: occ   Drug use: Never   Sexual activity: Not on file  Other Topics Concern   Not on file  Social History Narrative   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    no tobacco   Caffeine Use: very little, three times a week   Left handed    Social Determinants of Health   Financial Resource Strain: Low Risk  (01/14/2023)   Overall Financial Resource Strain (CARDIA)    Difficulty of Paying Living Expenses: Not hard at all  Food Insecurity: No Food Insecurity (01/14/2023)   Hunger Vital Sign    Worried About Running Out of Food in the Last Year: Never true    Ran Out of Food in the Last Year: Never true  Transportation Needs: No Transportation Needs (01/14/2023)   PRAPARE - Scientist, research (physical sciences) (Medical): No    Lack of Transportation (Non-Medical): No  Physical Activity: Insufficiently Active (01/14/2023)   Exercise Vital Sign    Days of Exercise per Week: 3 days    Minutes of Exercise per Session:  10 min  Stress: Stress Concern Present (01/14/2023)   [employer] of Occupational Health - Occupational Stress Questionnaire    Feeling of Stress : To some extent  Social Connections: Moderately Isolated (01/14/2023)   Social Connection and Isolation Panel [NHANES]    Frequency of Communication with Friends and Family: More than three times a week    Frequency of Social Gatherings with Friends and Family: More than three times a week    Attends Religious Services: Never    Database administrator or Organizations: No    Attends Banker Meetings: Never    Marital Status: Married    Vitals:   05/08/23 1120  BP: 100/70  Pulse: 75  Resp: 16  SpO2: 96%   Body mass index is 27.59 kg/m.  Physical Exam Vitals and nursing note reviewed.  Constitutional:      General: She is not in acute distress.    Appearance: She is well-developed.  HENT:     Head: Normocephalic and atraumatic.     Mouth/Throat:     Mouth: Mucous membranes are moist.     Pharynx: Oropharynx is clear.  Eyes:     Conjunctiva/sclera: Conjunctivae normal.  Cardiovascular:     Rate and Rhythm: Normal rate and regular rhythm.     Heart sounds: No murmur heard.    Comments: DP pulses palpable. Pulmonary:     Effort: Pulmonary effort is normal. No respiratory distress.     Breath sounds: Normal breath sounds.  Chest:     Chest wall: No tenderness.  Abdominal:     Palpations: Abdomen is soft. There is no mass.     Tenderness: There is no abdominal tenderness.  Musculoskeletal:     Right lower leg: No edema.     Left lower leg: No edema.  Lymphadenopathy:     Cervical: No cervical adenopathy.  Skin:    General: Skin is warm.     Findings: No erythema or rash.   Neurological:     General: No focal deficit present.     Mental Status: She is alert. Mental status is at baseline.     Gait: Gait normal.     Comments: Remembers year, does not recall month or day. Month: October. Day: Thursday. Stable gait, not assisted.  Psychiatric:        Mood and Affect: Affect normal. Mood is anxious.   ASSESSMENT AND PLAN:  Ms. Onstad was seen today for elevated blood pressure and chest pain.   Chest pain, unspecified type We discussed possible etiologies. It is difficult to obtain details, poor historian. EKG today: SR,normal axis and intervals,no T waves or ST seg abnormalities. No significant changes when compared with EKG on 11/15/21 except for PAC's not longer present today.  She had myocardial perfusion study in 02/2014 and 01/2011 due to CP,SOB,palpitation,and near syncope. Normal stress nuclear study. Cardiology referral placed, instructed to document symptoms,frequency,exacerbating factors,and associated symptoms. Clearly instructed about warning signs.  -     EKG 12-Lead -     Ambulatory referral to Cardiology  Elevated blood pressure reading BP today normal and most BP's during OV's have been in normal range. Recommend monitoring BP at home and to let me know about readings.  Diaphoresis Possible causes discussed. Sometimes associated with CP and with exertion. Last TSH normal at 2.04 on 05/01/23. Continue monitoring for new symptoms.  -     EKG 12-Lead  Mild cognitive impairment of uncertain or unknown etiology Assessment & Plan:  Per wife re[ort, problem has been stable and she is to continue to follow with neurologist annually, before if needed.  Return if symptoms worsen or fail to improve.  I, Rolla Etienne Wierda, acting as a scribe for Jerrie Gullo [place], MD., have documented all relevant documentation on the behalf of [name] Grygiel [place], MD, as directed by  [name] Franken [place], MD while in the presence of [name] Kuenzel [place], MD.   I, Ily Denno [place], MD,  have reviewed all documentation for this visit. The documentation on 05/08/23 for the exam, diagnosis, procedures, and orders are all accurate and complete.  Kyrstan Gotwalt G. [place], MD  [facility]. [place] office.

## 2023-05-11 ENCOUNTER — Other Ambulatory Visit: Payer: Self-pay

## 2023-05-11 ENCOUNTER — Encounter (HOSPITAL_BASED_OUTPATIENT_CLINIC_OR_DEPARTMENT_OTHER): Payer: Self-pay

## 2023-05-11 ENCOUNTER — Emergency Department (HOSPITAL_BASED_OUTPATIENT_CLINIC_OR_DEPARTMENT_OTHER): Payer: Medicare PPO

## 2023-05-11 ENCOUNTER — Emergency Department (HOSPITAL_BASED_OUTPATIENT_CLINIC_OR_DEPARTMENT_OTHER)
Admission: EM | Admit: 2023-05-11 | Discharge: 2023-05-11 | Disposition: A | Discharge status: Home / Self Care | Payer: Medicare PPO | Attending: Emergency Medicine | Admitting: Emergency Medicine

## 2023-05-11 DIAGNOSIS — R0789 Other chest pain: Secondary | ICD-10-CM | POA: Diagnosis not present

## 2023-05-11 DIAGNOSIS — R079 Chest pain, unspecified: Secondary | ICD-10-CM

## 2023-05-11 LAB — COMPREHENSIVE METABOLIC PANEL
ALT: 32 U/L (ref 0–44)
AST: 36 U/L (ref 15–41)
Albumin: 3.8 g/dL (ref 3.5–5.0)
Alkaline Phosphatase: 63 U/L (ref 38–126)
Anion gap: 9 (ref 5–15)
BUN: 13 mg/dL (ref 8–23)
CO2: 26 mmol/L (ref 22–32)
Calcium: 9.2 mg/dL (ref 8.9–10.3)
Chloride: 104 mmol/L (ref 98–111)
Creatinine, Ser: 0.76 mg/dL (ref 0.44–1.00)
GFR, Estimated: >60 mL/min (ref >60)
Glucose, Bld: 119 mg/dL — ABNORMAL HIGH (ref 70–99)
Potassium: 4.1 mmol/L (ref 3.5–5.1)
Sodium: 139 mmol/L (ref 135–145)
Total Bilirubin: 0.7 mg/dL (ref 0.3–1.2)
Total Protein: 7.5 g/dL (ref 6.5–8.1)

## 2023-05-11 LAB — CBC WITH DIFFERENTIAL/PLATELET
Abs Immature Granulocytes: 0.01 10*3/uL (ref 0.00–0.07)
Basophils Absolute: 0 10*3/uL (ref 0.0–0.1)
Basophils Relative: 1 %
Eosinophils Absolute: 0.2 10*3/uL (ref 0.0–0.5)
Eosinophils Relative: 4 %
HCT: 45.2 % (ref 36.0–46.0)
Hemoglobin: 14.7 g/dL (ref 12.0–15.0)
Immature Granulocytes: 0 %
Lymphocytes Relative: 37 %
Lymphs Abs: 1.8 10*3/uL (ref 0.7–4.0)
MCH: 31.3 pg (ref 26.0–34.0)
MCHC: 32.5 g/dL (ref 30.0–36.0)
MCV: 96.4 fL (ref 80.0–100.0)
Monocytes Absolute: 0.9 10*3/uL (ref 0.1–1.0)
Monocytes Relative: 20 %
Neutro Abs: 1.8 10*3/uL (ref 1.7–7.7)
Neutrophils Relative %: 38 %
Platelets: 187 10*3/uL (ref 150–400)
RBC: 4.69 MIL/uL (ref 3.87–5.11)
RDW: 12.7 % (ref 11.5–15.5)
WBC: 4.7 10*3/uL (ref 4.0–10.5)
nRBC: 0 % (ref 0.0–0.2)

## 2023-05-11 LAB — LIPASE, BLOOD: Lipase: 26 U/L (ref 11–51)

## 2023-05-11 LAB — TROPONIN I (HIGH SENSITIVITY): Troponin I (High Sensitivity): <2 ng/L (ref <18)

## 2023-05-11 NOTE — ED Triage Notes (Signed)
Pt presents to ED with complaints of chest pain that radiates to left shoulder. CP has been intermittent for moths. Saw PCP who gave referral to cardiologist. Pt  states that pain was worst today. Pt took 2 81 mg Asprin approximately 0930.

## 2023-05-11 NOTE — ED Provider Notes (Signed)
[place] [facility] Provider Note   [name]: [id number] Arrival date & time: 05/11/23  1105     History  Chief Complaint  Patient presents with   Chest Pain    [name] is a 62 y.o. female.  62 yo F with a chief manage chest pain.  This been going on for the better part of the week.  The patient has seen her family doctor for this.  Feels like it continues.  Worse with certain movements getting up and moving around.  Better if she lies still and does not move at all.  She has been getting sweaty without obvious explanation as well.  Denies trauma to the area denies cough congestion or fever.  Patient denies history of MI, denies hypertension hyperlipidemia or smoking.  Denies family history of MI. Hx of DM.  Patient denies history of PE or DVT denies hemoptysis denies unilateral lower extremity edema denies recent surgery immobilization hospitalization estrogen use or history of cancer.     Chest Pain      Home Medications Prior to Admission medications   Medication Sig Start Date End Date Taking? Authorizing Provider  Accu-Chek Softclix Lancets lancets USE TO CHECK BLOOD SUGARS 1-2 TIMES DAILY 12/03/22   [place], [name], MD  acetaminophen (TYLENOL) 325 MG tablet Take 650 mg by mouth every 6 (six) hours as needed for mild pain, fever or headache.    [provider]  albuterol (VENTOLIN HFA) 108 (90 Base) MCG/ACT inhaler Inhale 2 puffs into the lungs every 6 (six) hours as needed for wheezing or shortness of breath. 08/14/20 04/08/23  [place], [name], MD  Alcohol Swabs (ALCOHOL PREP) PADS To use for blood sugar checks. 12/08/22   [place], [name], MD  aspirin-acetaminophen-caffeine (EXCEDRIN MIGRAINE) [phone] MG tablet Take 2 tablets by mouth every 6 (six) hours as needed for headache.    [provider]  AUVELITY 45-105 MG TBCR Take 1 tablet by mouth every morning. 03/02/23   [provider]  Blood Glucose  Monitoring Suppl (ONE TOUCH ULTRA 2) w/Device KIT Use to test blood sugars 1-2 times daily. 10/07/21   [place], [name], MD  divalproex (DEPAKOTE ER) 250 MG 24 hr tablet Take 750 mg by mouth at bedtime. 11/20/20   [provider]  glucose blood (ACCU-CHEK GUIDE) test strip Use to test blood sugars 1-2 times daily. 12/08/22   [place], [name], MD  levothyroxine (SYNTHROID) 100 MCG tablet Take 1 tablet (100 mcg total) by mouth daily. 05/01/23   Shamleffer, Konrad Dolores, MD  meclizine (ANTIVERT) 25 MG tablet Take 1 tablet (25 mg total) by mouth 3 (three) times daily as needed for dizziness. 04/22/22   Melene Plan, DO  Multiple Vitamin (MULTIVITAMIN WITH MINERALS) TABS tablet Take 1 tablet by mouth daily.    [provider]  ondansetron (ZOFRAN) 4 MG tablet Take 1 tablet (4 mg total) by mouth every 8 (eight) hours as needed for nausea or vomiting. 04/23/22   Nita Sickle K, DO  pravastatin (PRAVACHOL) 20 MG tablet Take 1 tablet by mouth once daily 04/13/23   [place], [name], MD  terbinafine (LAMISIL) 250 MG tablet Take 1 tablet (250 mg total) by mouth daily. 03/30/23   Felecia Shelling, DPM      Allergies    Morphine and codeine, Ambien [zolpidem], Duloxetine, Latuda [lurasidone hcl], Morphine, and Shingrix [zoster vac recomb adjuvanted]    Review of Systems   Review of Systems  Cardiovascular:  Positive for chest pain.    Physical Exam Updated Vital Signs BP 121/71   Pulse 66   Temp 98.6 F (37 C)   Resp 17   Ht 5' (1.524 m)   Wt 65.3 kg   SpO2 97%   BMI 28.12 kg/m  Physical Exam Vitals and nursing note reviewed.  Constitutional:      General: She is not in acute distress.    Appearance: She is well-developed. She is not diaphoretic.  HENT:     Head: Normocephalic and atraumatic.  Eyes:     Pupils: Pupils are equal, round, and reactive to light.  Cardiovascular:     Rate and Rhythm: Normal rate and regular rhythm.     Heart sounds: No murmur heard.    No friction rub. No  gallop.  Pulmonary:     Effort: Pulmonary effort is normal.     Breath sounds: No wheezing or rales.  Chest:     Chest wall: Tenderness present.     Comments: Patient grimaces with palpation of the right chest wall but states that it does not hurt. Abdominal:     General: There is no distension.     Palpations: Abdomen is soft.     Tenderness: There is no abdominal tenderness.  Musculoskeletal:        General: No tenderness.     Cervical back: Normal range of motion and neck supple.  Skin:    General: Skin is warm and dry.  Neurological:     Mental Status: She is alert and oriented to person, place, and time.  Psychiatric:        Behavior: Behavior normal.     ED Results / Procedures / Treatments   Labs (all labs ordered are listed, but only abnormal results are displayed) Labs Reviewed  COMPREHENSIVE METABOLIC PANEL - Abnormal; Notable for the following components:      Result Value   Glucose, Bld 119 (*)    All other components within normal limits  CBC WITH DIFFERENTIAL/PLATELET  LIPASE, BLOOD  TROPONIN I (HIGH SENSITIVITY)    EKG EKG Interpretation Date/Time:  Monday May 11 2023 11:21:04 EDT Ventricular Rate:  70 PR Interval:  153 QRS Duration:  79 QT Interval:  441 QTC Calculation: 476 R Axis:   69  Text Interpretation: Sinus rhythm Low voltage, precordial leads Abnormal R-wave progression, early transition No significant change since last tracing Confirmed by Melene Plan [phone]) on 05/11/2023 11:29:45 AM  Radiology DG Chest Port 1 View  Result Date: 05/11/2023 CLINICAL DATA:  Chest pain radiating to the left shoulder. EXAM: PORTABLE CHEST 1 VIEW COMPARISON:  03/02/2023 FINDINGS: Examination is degraded due to patient body habitus. Unchanged cardiac silhouette and mediastinal contours. There is persistent mild elevation/eventration of the right hemidiaphragm. No focal airspace opacities. No pleural effusion or pneumothorax. No evidence of edema. No acute  osseous abnormalities. Postcholecystectomy. IMPRESSION: No acute cardiopulmonary disease. Electronically Signed   By: Simonne Come M.D.   On: 05/11/2023 13:34    Procedures Procedures    Medications Ordered in ED Medications - No data to display  ED Course/ Medical Decision Making/ A&P                                 Medical Decision Making Amount and/or Complexity of Data Reviewed Labs: ordered. Radiology: ordered.   62 yo F with a chief complaint of chest pain.  Off  and on for some time but worsening over the past week.  No significant change in 6 hours.  Will obtain chest x-ray blood work.  Single troponin.  I feel unlikely to be pulmonary embolism based on history and exam and feel that no further workup would be beneficial at this time.  Troponin negative, no anemia, no leukocytosis no significant electrolyte abnormalities.  LFTs and lipase are unremarkable.  Chest x-ray independently interpreted by me without focal infiltrate or pneumothorax.   I have discussed the diagnosis/risks/treatment options with the patient and family.  Evaluation and diagnostic testing in the emergency department does not suggest an emergent condition requiring admission or immediate intervention beyond what has been performed at this time.  They will follow up with PCP. We also discussed returning to the ED immediately if new or worsening sx occur. We discussed the sx which are most concerning (e.g., sudden worsening pain, fever, inability to tolerate by mouth) that necessitate immediate return. Medications administered to the patient during their visit and any new prescriptions provided to the patient are listed below.  Medications given during this visit Medications - No data to display   The patient appears reasonably screen and/or stabilized for discharge and I doubt any other medical condition or other [employer] requiring further screening, evaluation, or treatment in the ED at this time prior to discharge.           Final Clinical Impression(s) / ED Diagnoses Final diagnoses:  Nonspecific chest pain    Rx / DC Orders ED Discharge Orders          Ordered    Ambulatory referral to Cardiology       Comments: If you have not heard from the Cardiology office within the next 72 hours please call [phone].   05/11/23 1338              Melene Plan, DO 05/11/23 1339

## 2023-05-11 NOTE — ED Notes (Signed)
Fall risk armband Fall risk sign on door Patient wearing shoes

## 2023-05-11 NOTE — Discharge Instructions (Signed)
Your marker for heart damage was completely negative.  This makes it very unlikely that the symptoms that you are having are due to an acute heart attack.  This does not mean that nothing is wrong with you.  Please follow-up with your family doctor in the office and have them keep track of how you are doing and make sure that you are getting better.  I have placed a referral for the cardiology office to reach out to you to set up an appointment.  Please return for worsening symptoms.

## 2023-05-14 ENCOUNTER — Emergency Department (HOSPITAL_COMMUNITY): Payer: Medicare PPO

## 2023-05-14 ENCOUNTER — Emergency Department (HOSPITAL_COMMUNITY)
Admission: EM | Admit: 2023-05-14 | Discharge: 2023-05-14 | Disposition: A | Discharge status: Home / Self Care | Payer: Medicare PPO | Attending: Emergency Medicine | Admitting: Emergency Medicine

## 2023-05-14 ENCOUNTER — Encounter (HOSPITAL_COMMUNITY): Payer: Self-pay | Admitting: Emergency Medicine

## 2023-05-14 ENCOUNTER — Other Ambulatory Visit: Payer: Self-pay

## 2023-05-14 DIAGNOSIS — Z8639 Personal history of other endocrine, nutritional and metabolic disease: Secondary | ICD-10-CM | POA: Insufficient documentation

## 2023-05-14 DIAGNOSIS — R079 Chest pain, unspecified: Secondary | ICD-10-CM | POA: Diagnosis not present

## 2023-05-14 DIAGNOSIS — R634 Abnormal weight loss: Secondary | ICD-10-CM | POA: Diagnosis not present

## 2023-05-14 DIAGNOSIS — I10 Essential (primary) hypertension: Secondary | ICD-10-CM | POA: Diagnosis not present

## 2023-05-14 DIAGNOSIS — R072 Precordial pain: Secondary | ICD-10-CM | POA: Insufficient documentation

## 2023-05-14 DIAGNOSIS — E119 Type 2 diabetes mellitus without complications: Secondary | ICD-10-CM | POA: Insufficient documentation

## 2023-05-14 DIAGNOSIS — Z87898 Personal history of other specified conditions: Secondary | ICD-10-CM

## 2023-05-14 DIAGNOSIS — R0789 Other chest pain: Secondary | ICD-10-CM | POA: Diagnosis not present

## 2023-05-14 DIAGNOSIS — I959 Hypotension, unspecified: Secondary | ICD-10-CM | POA: Diagnosis not present

## 2023-05-14 DIAGNOSIS — R42 Dizziness and giddiness: Secondary | ICD-10-CM | POA: Diagnosis not present

## 2023-05-14 DIAGNOSIS — R9389 Abnormal findings on diagnostic imaging of other specified body structures: Secondary | ICD-10-CM | POA: Diagnosis not present

## 2023-05-14 DIAGNOSIS — Z7982 Long term (current) use of aspirin: Secondary | ICD-10-CM | POA: Diagnosis not present

## 2023-05-14 LAB — CBC
HCT: 42.1 % (ref 36.0–46.0)
Hemoglobin: 14.2 g/dL (ref 12.0–15.0)
MCH: 32 pg (ref 26.0–34.0)
MCHC: 33.7 g/dL (ref 30.0–36.0)
MCV: 94.8 fL (ref 80.0–100.0)
Platelets: 212 10*3/uL (ref 150–400)
RBC: 4.44 MIL/uL (ref 3.87–5.11)
RDW: 12.4 % (ref 11.5–15.5)
WBC: 5.2 10*3/uL (ref 4.0–10.5)
nRBC: 0 % (ref 0.0–0.2)

## 2023-05-14 LAB — BASIC METABOLIC PANEL
Anion gap: 10 (ref 5–15)
BUN: 14 mg/dL (ref 8–23)
CO2: 23 mmol/L (ref 22–32)
Calcium: 9.5 mg/dL (ref 8.9–10.3)
Chloride: 107 mmol/L (ref 98–111)
Creatinine, Ser: 0.88 mg/dL (ref 0.44–1.00)
GFR, Estimated: >60 mL/min (ref >60)
Glucose, Bld: 120 mg/dL — ABNORMAL HIGH (ref 70–99)
Potassium: 4.1 mmol/L (ref 3.5–5.1)
Sodium: 140 mmol/L (ref 135–145)

## 2023-05-14 LAB — TROPONIN I (HIGH SENSITIVITY)
Troponin I (High Sensitivity): <2 ng/L (ref <18)
Troponin I (High Sensitivity): 2 ng/L (ref <18)

## 2023-05-14 LAB — D-DIMER, QUANTITATIVE: D-Dimer, Quant: 0.36 ug/mL-FEU (ref 0.00–0.50)

## 2023-05-14 LAB — TSH: TSH: 1.981 u[IU]/mL (ref 0.350–4.500)

## 2023-05-14 MED ORDER — KETOROLAC TROMETHAMINE 30 MG/ML IJ SOLN
15.0000 mg | Freq: Once | INTRAMUSCULAR | Status: AC
  Administered 2023-05-14: 15 mg via INTRAVENOUS
  Filled 2023-05-14: qty 1

## 2023-05-14 MED ORDER — LACTATED RINGERS IV BOLUS
1000.0000 mL | Freq: Once | INTRAVENOUS | Status: AC
  Administered 2023-05-14: 1000 mL via INTRAVENOUS

## 2023-05-14 MED ORDER — NITROGLYCERIN 0.4 MG SL SUBL
0.4000 mg | SUBLINGUAL_TABLET | SUBLINGUAL | Status: DC | PRN
  Filled 2023-05-14: qty 1

## 2023-05-14 MED ORDER — ASPIRIN 81 MG PO CHEW
324.0000 mg | CHEWABLE_TABLET | Freq: Once | ORAL | Status: AC
  Administered 2023-05-14: 324 mg via ORAL
  Filled 2023-05-14: qty 4

## 2023-05-14 NOTE — ED Notes (Signed)
Pt states when she woke up this morning face was bright red but was not hot or sweating at that time

## 2023-05-14 NOTE — ED Provider Notes (Signed)
[place] EMERGENCY DEPARTMENT AT [facility] Provider Note   [name]: [id number] Arrival date & time: 05/14/23  1308     History  Chief Complaint  Patient presents with   Chest Pain    [name] is a 62 y.o. female.  HPI    62 year old female comes in with chief complaint of chest pain, dizziness, fatigue, diaphoresis, and an episode of flushing today to her face.  Patient has history of endometriosis, costochondritis, diabetes, hypertension and hyperlipidemia.  Patient indicates that she has been experiencing chest pain over the last several weeks.  The chest pain is mostly present during the day.  Chest pain is unprovoked, and described as pressure type feeling that radiates towards her neck.  She has associated fatigue.  She has been waking up with diaphoresis the last few days.  She also has noted 15 pound weight loss over the last 1 to 2 months that is unexplained and today she had flushing to her face when she had finished laundry.   Patient does not have any active cancer disease history.  No history of PE, DVT.  There is family history of lung cancer.  Patient's thyroid has been resected.  Currently patient denies any substance use disorder, heavy smoking or alcohol use.  Home Medications Prior to Admission medications   Medication Sig Start Date End Date Taking? Authorizing Provider  Accu-Chek Softclix Lancets lancets USE TO CHECK BLOOD SUGARS 1-2 TIMES DAILY 12/03/22   [place], [name], MD  acetaminophen (TYLENOL) 325 MG tablet Take 650 mg by mouth every 6 (six) hours as needed for mild pain, fever or headache.    [provider]  albuterol (VENTOLIN HFA) 108 (90 Base) MCG/ACT inhaler Inhale 2 puffs into the lungs every 6 (six) hours as needed for wheezing or shortness of breath. 08/14/20 04/08/23  [place], [name], MD  Alcohol Swabs (ALCOHOL PREP) PADS To use for blood sugar checks. 12/08/22   [place], [name], MD  aspirin-acetaminophen-caffeine  (EXCEDRIN MIGRAINE) [phone] MG tablet Take 2 tablets by mouth every 6 (six) hours as needed for headache.    [provider]  AUVELITY 45-105 MG TBCR Take 1 tablet by mouth every morning. 03/02/23   [provider]  Blood Glucose Monitoring Suppl (ONE TOUCH ULTRA 2) w/Device KIT Use to test blood sugars 1-2 times daily. 10/07/21   [place], [name], MD  divalproex (DEPAKOTE ER) 250 MG 24 hr tablet Take 750 mg by mouth at bedtime. 11/20/20   [provider]  glucose blood (ACCU-CHEK GUIDE) test strip Use to test blood sugars 1-2 times daily. 12/08/22   [place], [name], MD  levothyroxine (SYNTHROID) 100 MCG tablet Take 1 tablet (100 mcg total) by mouth daily. 05/01/23   Shamleffer, Konrad Dolores, MD  meclizine (ANTIVERT) 25 MG tablet Take 1 tablet (25 mg total) by mouth 3 (three) times daily as needed for dizziness. 04/22/22   Melene Plan, DO  Multiple Vitamin (MULTIVITAMIN WITH MINERALS) TABS tablet Take 1 tablet by mouth daily.    [provider]  ondansetron (ZOFRAN) 4 MG tablet Take 1 tablet (4 mg total) by mouth every 8 (eight) hours as needed for nausea or vomiting. 04/23/22   Nita Sickle K, DO  pravastatin (PRAVACHOL) 20 MG tablet Take 1 tablet by mouth once daily 04/13/23   [place], [name], MD  terbinafine (LAMISIL) 250 MG tablet Take 1 tablet (250 mg total) by mouth daily. 03/30/23   Felecia Shelling, DPM  Allergies    Morphine and codeine, Ambien [zolpidem], Duloxetine, Latuda [lurasidone hcl], Morphine, and Shingrix [zoster vac recomb adjuvanted]    Review of Systems   Review of Systems  All other systems reviewed and are negative.   Physical Exam Updated Vital Signs BP 128/77   Pulse 63   Temp (!) 97.5 F (36.4 C) (Oral)   Resp 13   Ht 5' (1.524 m)   Wt 65.3 kg   SpO2 97%   BMI 28.12 kg/m  Physical Exam Vitals and nursing note reviewed.  Constitutional:      Appearance: She is well-developed.  HENT:     Head: Atraumatic.  Cardiovascular:      Rate and Rhythm: Normal rate.  Pulmonary:     Effort: Pulmonary effort is normal.     Breath sounds: Normal breath sounds.  Musculoskeletal:     Cervical back: Normal range of motion and neck supple.     Right lower leg: No edema.     Left lower leg: No edema.  Skin:    General: Skin is warm and dry.  Neurological:     Mental Status: She is alert and oriented to person, place, and time.     ED Results / Procedures / Treatments   Labs (all labs ordered are listed, but only abnormal results are displayed) Labs Reviewed  BASIC METABOLIC PANEL - Abnormal; Notable for the following components:      Result Value   Glucose, Bld 120 (*)    All other components within normal limits  CBC  D-DIMER, QUANTITATIVE  TSH  TROPONIN I (HIGH SENSITIVITY)  TROPONIN I (HIGH SENSITIVITY)    EKG EKG Interpretation Date/Time:  Thursday May 14 2023 13:31:48 EDT Ventricular Rate:  78 PR Interval:  147 QRS Duration:  82 QT Interval:  416 QTC Calculation: 474 R Axis:   63  Text Interpretation: Sinus rhythm Low voltage, precordial leads No acute changes No significant change since last tracing Confirmed by Derwood Kaplan [phone]) on 05/14/2023 3:15:57 PM  Radiology DG Chest 2 View  Result Date: 05/14/2023 CLINICAL DATA:  Several week history of worsening chest pain with shortness of breath EXAM: CHEST - 2 VIEW COMPARISON:  Chest radiograph dated 05/11/2023 FINDINGS: Unchanged elevation of the right hemidiaphragm. Normal lung volumes. No focal consolidations. No pleural effusion or pneumothorax. The heart size and mediastinal contours are within normal limits. No acute osseous abnormality. Surgical clips project over the thoracic inlet. Right upper quadrant surgical clips. IMPRESSION: 1. No active cardiopulmonary disease. 2. Unchanged elevation of the right hemidiaphragm. Electronically Signed   By: Agustin Cree M.D.   On: 05/14/2023 16:06    Procedures Procedures    Medications Ordered  in ED Medications  nitroGLYCERIN (NITROSTAT) SL tablet 0.4 mg (has no administration in time range)  ketorolac (TORADOL) 30 MG/ML injection 15 mg (15 mg Intravenous Given 05/14/23 1519)  aspirin chewable tablet 324 mg (324 mg Oral Given 05/14/23 1543)  lactated ringers bolus 1,000 mL (0 mLs Intravenous Stopped 05/14/23 1729)    ED Course/ Medical Decision Making/ A&P                                 Medical Decision Making Amount and/or Complexity of Data Reviewed Labs: ordered. Radiology: ordered.  Risk OTC drugs. Prescription drug management.   This patient presents to the ED with chief complaint(s) of persistent chest pain with associated fatigue.  Patient also  has diaphoresis, that appears to be more nighttime /early morning issue and with pertinent past medical history of hypertension, hyperlipidemia, diabetes, remote thyroid cancer status post resection.The complaint involves an extensive differential diagnosis and also carries with it a high risk of complications and morbidity.    Patient's chest pain does have some typical and atypical features.  Radiation, location are typical.  However patient indicates that most of the time the pain has been fairly constant throughout the day, and has been present now for over 2 weeks.  Pain is often unprovoked as well.  She also has symptoms like diaphoresis, which appear more like night sweats, fatigue and today she had an episode of flushing.  These are nonspecific symptoms.  The differential diagnosis includes acute coronary syndrome, pulmonary embolism, paraneoplastic process, metabolic syndrome, endocrine crisis.  The initial plan is to get delta troponin again.  We will get D-dimer at this time.  If D-dimer is positive, we will get CT chest.   Additional history obtained: Additional history obtained from family Records reviewed  records including CT scan from 2021, MRI from thereafter.  Independent labs interpretation:  The  following labs were independently interpreted: Basic labs are reassuring.  Independent visualization and interpretation of imaging: - I independently visualized the following imaging with scope of interpretation limited to determining acute life threatening conditions related to emergency care: X-ray of the chest, which revealed no pneumothorax  Treatment and Reassessment: Results of the initial workup discussed with the patient.  Plan for D-dimer and second troponin along with TSH discussed with her.  Patient has a cardiology follow-up next week.  I informed her that I am comfortable with the plan for outpatient cardiology follow-up next week for further comprehensive workup if her troponins here are reassuring.  I discussed with the patient that she must follow-up with primary care doctor, she is having a lot of weeks symptoms that needs additional evaluation different from cardiology workup.   Final Clinical Impression(s) / ED Diagnoses Final diagnoses:  History of night sweats  Precordial chest pain  Dizziness  Weight loss    Rx / DC Orders ED Discharge Orders     None         Derwood Kaplan, MD 05/14/23 1851

## 2023-05-14 NOTE — ED Notes (Signed)
Pt declined wanting nitroglycerin at this time. Pt has ambulated to bathroom.

## 2023-05-14 NOTE — ED Triage Notes (Signed)
Pt bib EMS with complains of sweating, chest pain x 2 weeks.EMS arrived to pt home and when placed on the monitor cardiac monitor showed flat line but pt was talking to paramedic. EMS placed on a 2nd monitor and again showed flat line but pt alert and answering questions. Pt feels nauseated at this time denies any vomiting.

## 2023-05-14 NOTE — Discharge Instructions (Signed)
We saw you in the ER for chest pains, sweats, flushing of your face.  It also you have been experiencing some weight loss and just generalized weakness and fatigue and dizziness.  All the results in the ER are normal, labs and imaging.  Your heart enzymes are undetectable and your dimer test, to screen for blood clot in the lung is negative. We are not sure what is causing your symptoms.  We would like you to follow-up with the primary care doctor for additional workup.  We would like you to follow-up with the cardiologist as planned next week for additional comprehensive cardiac evaluation.

## 2023-05-14 NOTE — ED Notes (Signed)
Patient transported to X-ray 

## 2023-05-14 NOTE — ED Provider Notes (Signed)
62 year old female presents to the emergency department today with chest pain.  Her initial workup is reassuring.  She is awaiting a second troponin for disposition.  Patient does have cardiology follow-up next week.  The patient's workup thus far including D-dimer is negative and reassuring.  Physical Exam  BP (!) 140/79   Pulse 76   Temp 99 F (37.2 C) (Oral)   Resp 14   Ht 5' (1.524 m)   Wt 65.3 kg   SpO2 96%   BMI 28.12 kg/m   Physical Exam No acute distress  Procedures  Procedures  ED Course / MDM    Medical Decision Making Amount and/or Complexity of Data Reviewed Labs: ordered. Radiology: ordered.  Risk OTC drugs. Prescription drug management.   The patient second troponin is negative.  She does have adequate follow-up with cardiology.  She is discharged with return precautions.       Durwin Glaze, MD 05/14/23 [phone]

## 2023-05-18 ENCOUNTER — Emergency Department (HOSPITAL_COMMUNITY)
Admission: EM | Admit: 2023-05-18 | Discharge: 2023-05-19 | Disposition: A | Discharge status: Home / Self Care | Payer: Medicare PPO | Attending: Emergency Medicine | Admitting: Emergency Medicine

## 2023-05-18 ENCOUNTER — Emergency Department (HOSPITAL_COMMUNITY): Payer: Medicare PPO

## 2023-05-18 ENCOUNTER — Encounter (HOSPITAL_COMMUNITY): Payer: Self-pay

## 2023-05-18 ENCOUNTER — Other Ambulatory Visit: Payer: Self-pay

## 2023-05-18 DIAGNOSIS — R918 Other nonspecific abnormal finding of lung field: Secondary | ICD-10-CM | POA: Diagnosis not present

## 2023-05-18 DIAGNOSIS — R0602 Shortness of breath: Secondary | ICD-10-CM | POA: Insufficient documentation

## 2023-05-18 DIAGNOSIS — R000 Tachycardia, unspecified: Secondary | ICD-10-CM | POA: Diagnosis not present

## 2023-05-18 DIAGNOSIS — Z7982 Long term (current) use of aspirin: Secondary | ICD-10-CM | POA: Diagnosis not present

## 2023-05-18 DIAGNOSIS — I2699 Other pulmonary embolism without acute cor pulmonale: Secondary | ICD-10-CM | POA: Diagnosis not present

## 2023-05-18 DIAGNOSIS — J984 Other disorders of lung: Secondary | ICD-10-CM | POA: Diagnosis not present

## 2023-05-18 DIAGNOSIS — R531 Weakness: Secondary | ICD-10-CM | POA: Diagnosis not present

## 2023-05-18 DIAGNOSIS — R079 Chest pain, unspecified: Secondary | ICD-10-CM

## 2023-05-18 DIAGNOSIS — R42 Dizziness and giddiness: Secondary | ICD-10-CM | POA: Diagnosis not present

## 2023-05-18 DIAGNOSIS — E1165 Type 2 diabetes mellitus with hyperglycemia: Secondary | ICD-10-CM | POA: Insufficient documentation

## 2023-05-18 DIAGNOSIS — R791 Abnormal coagulation profile: Secondary | ICD-10-CM | POA: Diagnosis not present

## 2023-05-18 DIAGNOSIS — R0789 Other chest pain: Secondary | ICD-10-CM | POA: Insufficient documentation

## 2023-05-18 DIAGNOSIS — R0689 Other abnormalities of breathing: Secondary | ICD-10-CM | POA: Diagnosis not present

## 2023-05-18 DIAGNOSIS — R509 Fever, unspecified: Secondary | ICD-10-CM | POA: Diagnosis not present

## 2023-05-18 LAB — URINALYSIS, ROUTINE W REFLEX MICROSCOPIC
Bilirubin Urine: NEGATIVE
Glucose, UA: NEGATIVE mg/dL
Hgb urine dipstick: NEGATIVE
Ketones, ur: NEGATIVE mg/dL
Leukocytes,Ua: NEGATIVE
Nitrite: NEGATIVE
Protein, ur: NEGATIVE mg/dL
Specific Gravity, Urine: 1.011 (ref 1.005–1.030)
pH: 7 (ref 5.0–8.0)

## 2023-05-18 LAB — BASIC METABOLIC PANEL
Anion gap: 9 (ref 5–15)
BUN: 9 mg/dL (ref 8–23)
CO2: 24 mmol/L (ref 22–32)
Calcium: 8.8 mg/dL — ABNORMAL LOW (ref 8.9–10.3)
Chloride: 103 mmol/L (ref 98–111)
Creatinine, Ser: 0.76 mg/dL (ref 0.44–1.00)
GFR, Estimated: >60 mL/min (ref >60)
Glucose, Bld: 131 mg/dL — ABNORMAL HIGH (ref 70–99)
Potassium: 3.6 mmol/L (ref 3.5–5.1)
Sodium: 136 mmol/L (ref 135–145)

## 2023-05-18 LAB — CBC
HCT: 38.9 % (ref 36.0–46.0)
Hemoglobin: 12.9 g/dL (ref 12.0–15.0)
MCH: 31.5 pg (ref 26.0–34.0)
MCHC: 33.2 g/dL (ref 30.0–36.0)
MCV: 94.9 fL (ref 80.0–100.0)
Platelets: 207 10*3/uL (ref 150–400)
RBC: 4.1 MIL/uL (ref 3.87–5.11)
RDW: 12.6 % (ref 11.5–15.5)
WBC: 11.9 10*3/uL — ABNORMAL HIGH (ref 4.0–10.5)
nRBC: 0 % (ref 0.0–0.2)

## 2023-05-18 LAB — TROPONIN I (HIGH SENSITIVITY)
Troponin I (High Sensitivity): 2 ng/L (ref <18)
Troponin I (High Sensitivity): <2 ng/L (ref <18)

## 2023-05-18 LAB — CBG MONITORING, ED
Glucose-Capillary: 128 mg/dL — ABNORMAL HIGH (ref 70–99)
Glucose-Capillary: 132 mg/dL — ABNORMAL HIGH (ref 70–99)

## 2023-05-18 LAB — D-DIMER, QUANTITATIVE: D-Dimer, Quant: 0.64 ug{FEU}/mL — ABNORMAL HIGH (ref 0.00–0.50)

## 2023-05-18 MED ORDER — NITROGLYCERIN 0.4 MG SL SUBL: 0.4000 mg | SUBLINGUAL_TABLET | Freq: Once | SUBLINGUAL | Status: DC

## 2023-05-18 MED ORDER — ONDANSETRON HCL 4 MG/2ML IJ SOLN
4.0000 mg | Freq: Once | INTRAMUSCULAR | Status: AC
  Administered 2023-05-18: 4 mg via INTRAVENOUS
  Filled 2023-05-18: qty 2

## 2023-05-18 MED ORDER — IOHEXOL 350 MG/ML SOLN
75.0000 mL | Freq: Once | INTRAVENOUS | Status: AC | PRN
  Administered 2023-05-18: 75 mL via INTRAVENOUS

## 2023-05-18 MED ORDER — MORPHINE SULFATE (PF) 2 MG/ML IV SOLN
2.0000 mg | Freq: Once | INTRAVENOUS | Status: AC
  Administered 2023-05-18: 2 mg via INTRAVENOUS
  Filled 2023-05-18: qty 1

## 2023-05-18 NOTE — ED Triage Notes (Signed)
Pt BIB [facility] for possible sepsis. EMS initially called out for fall upon arrival pt states being weak the past couple weeks and having intermittent CP for the past couple of days. Per EMS pt informed them of having rt side CP that radiates into her shoulder.

## 2023-05-18 NOTE — ED Provider Notes (Signed)
[place] EMERGENCY DEPARTMENT AT [facility] Provider Note   [name]: [id number] Arrival date & time: 05/18/23  1834     History  Chief Complaint  Patient presents with   Weakness    [name] is a 62 y.o. female, history of ulcer, gastroparesis, DM type II, who presents to the ED secondary to chest pain, with associated shortness of breath is been going on for the last couple weeks.  She states it is worse on exertion, and that she has been becoming more out of breath when walking.  States she has no known heart problems, but states that she has the pain resolved, after sitting for a few moments.  Pain typically last for about 10 to 15 minutes, until she sits down, and it resolves.  States is pressurized in nature, in the middle of her chest, associated with shortness of breath.  Denies any swelling in the legs, worse when taking deep breath, or that she gets a bit out of breath, that she started to have chest pain.    Home Medications Prior to Admission medications   Medication Sig Start Date End Date Taking? Authorizing Provider  albuterol (VENTOLIN HFA) 108 (90 Base) MCG/ACT inhaler Inhale 2 puffs into the lungs every 6 (six) hours as needed for wheezing or shortness of breath. 08/14/20 05/18/23 Yes [place], [name], MD  aspirin-acetaminophen-caffeine [facility] MIGRAINE) [phone] MG tablet Take 2 tablets by mouth every 6 (six) hours as needed for headache.   Yes [provider]  AUVELITY 45-105 MG TBCR Take 1 tablet by mouth every morning. 03/02/23  Yes [provider]  divalproex (DEPAKOTE ER) 250 MG 24 hr tablet Take 750 mg by mouth at bedtime. 11/20/20  Yes [provider]  levothyroxine (SYNTHROID) 100 MCG tablet Take 1 tablet (100 mcg total) by mouth daily. 05/01/23  Yes Shamleffer, Konrad Dolores, MD  meclizine (ANTIVERT) 25 MG tablet Take 1 tablet (25 mg total) by mouth 3 (three) times daily as needed for dizziness. 04/22/22  Yes Melene Plan,  DO  Multiple Vitamin (MULTIVITAMIN WITH MINERALS) TABS tablet Take 1 tablet by mouth daily.   Yes [provider]  ondansetron (ZOFRAN) 4 MG tablet Take 1 tablet (4 mg total) by mouth every 8 (eight) hours as needed for nausea or vomiting. 04/23/22  Yes Patel, Donika K, DO  pravastatin (PRAVACHOL) 20 MG tablet Take 1 tablet by mouth once daily 04/13/23  Yes [place], [name], MD  terbinafine (LAMISIL) 250 MG tablet Take 1 tablet (250 mg total) by mouth daily. 03/30/23  Yes Felecia Shelling, DPM  Accu-Chek Softclix Lancets lancets USE TO CHECK BLOOD SUGARS 1-2 TIMES DAILY 12/03/22   [place], [name], MD  Alcohol Swabs (ALCOHOL PREP) PADS To use for blood sugar checks. 12/08/22   [place], [name], MD  Blood Glucose Monitoring Suppl (ONE TOUCH ULTRA 2) w/Device KIT Use to test blood sugars 1-2 times daily. 10/07/21   [place], [name], MD  glucose blood (ACCU-CHEK GUIDE) test strip Use to test blood sugars 1-2 times daily. 12/08/22   [place], [name], MD      Allergies    Morphine and codeine, Ambien [zolpidem], Duloxetine, Latuda [lurasidone hcl], Morphine, and Shingrix [zoster vac recomb adjuvanted]    Review of Systems   Review of Systems  Constitutional:  Negative for fever.  Respiratory:  Positive for shortness of breath.   Cardiovascular:  Positive for chest pain.  Neurological:  Positive for weakness.    Physical  Exam Updated Vital Signs BP 109/68   Pulse 95   Temp 98.5 F (36.9 C) (Oral)   Resp 16   Ht 5' (1.524 m)   Wt 65.3 kg   SpO2 95%   BMI 28.12 kg/m  Physical Exam Vitals and nursing note reviewed.  Constitutional:      General: She is not in acute distress.    Appearance: She is well-developed.  HENT:     Head: Normocephalic and atraumatic.  Eyes:     Conjunctiva/sclera: Conjunctivae normal.  Cardiovascular:     Rate and Rhythm: Normal rate and regular rhythm.     Heart sounds: No murmur heard. Pulmonary:     Effort: Pulmonary effort is normal. No respiratory  distress.     Breath sounds: Normal breath sounds.  Abdominal:     Palpations: Abdomen is soft.     Tenderness: There is no abdominal tenderness.  Musculoskeletal:        General: No swelling.     Cervical back: Neck supple.  Skin:    General: Skin is warm and dry.     Capillary Refill: Capillary refill takes less than 2 seconds.  Neurological:     Mental Status: She is alert.  Psychiatric:        Mood and Affect: Mood normal.     ED Results / Procedures / Treatments   Labs (all labs ordered are listed, but only abnormal results are displayed) Labs Reviewed  BASIC METABOLIC PANEL - Abnormal; Notable for the following components:      Result Value   Glucose, Bld 131 (*)    Calcium 8.8 (*)    All other components within normal limits  CBC - Abnormal; Notable for the following components:   WBC 11.9 (*)    All other components within normal limits  D-DIMER, QUANTITATIVE - Abnormal; Notable for the following components:   D-Dimer, Quant 0.64 (*)    All other components within normal limits  CBG MONITORING, ED - Abnormal; Notable for the following components:   Glucose-Capillary 132 (*)    All other components within normal limits  CBG MONITORING, ED - Abnormal; Notable for the following components:   Glucose-Capillary 128 (*)    All other components within normal limits  URINALYSIS, ROUTINE W REFLEX MICROSCOPIC  TROPONIN I (HIGH SENSITIVITY)  TROPONIN I (HIGH SENSITIVITY)    EKG EKG Interpretation Date/Time:  Monday May 18 2023 18:48:22 EDT Ventricular Rate:  96 PR Interval:  147 QRS Duration:  88 QT Interval:  359 QTC Calculation: 454 R Axis:   60  Text Interpretation: Sinus tachycardia Atrial premature complexes Low voltage, precordial leads Confirmed by Vanetta Mulders [phone]) on 05/18/2023 10:36:23 PM  Radiology DG Chest 2 View  Result Date: 05/18/2023 CLINICAL DATA:  Chest pain and shortness of breath EXAM: CHEST - 2 VIEW COMPARISON:  05/14/2023  FINDINGS: Cardiac shadow is stable. Lungs are clear. No bony abnormality is noted. IMPRESSION: No active cardiopulmonary disease. Electronically Signed   By: Alcide Clever M.D.   On: 05/18/2023 22:43    Procedures Procedures    Medications Ordered in ED Medications  morphine (PF) 2 MG/ML injection 2 mg (has no administration in time range)  ondansetron (ZOFRAN) injection 4 mg (has no administration in time range)  iohexol (OMNIPAQUE) 350 MG/ML injection 75 mL (75 mLs Intravenous Contrast Given 05/18/23 2226)    ED Course/ Medical Decision Making/ A&P             HEART Score:  4                    Medical Decision Making Patient is a 62 year old female, history of diabetes, duodenal ulcer, gastroparesis, who presents to the ED secondary to chest pain, shortness of breath, this been going on for the last couple weeks, it is worse on exertion, and relieved when at rest.  She states her pain is an 8 out of 10, currently.  Pain last for about 10 or 15 minutes, is improved with rest but not always.  Is pressurized.  The pain sometimes takes her breath away, we will obtain a D-dimer given that she is 50+, with chest pain and shortness of breath as well as troponins and an EKG for further evaluation.  Amount and/or Complexity of Data Reviewed Labs: ordered.    Details: Initial troponin negative, D-dimer elevated Radiology: ordered. Discussion of management or test interpretation with external provider(s): Heart score 4, ordered morphine and zofran for patient pending response given severity of pain.  Sounds cardiac in nature given the shortness of breath, and chest pain on exertion.  CTA pending at this time to evaluate for possible PE.  Signed out to Dr. Bebe Shaggy, for disposition.  And follow-up on CTA and second troponin as well as response to meds.   Risk Prescription drug management.    Final Clinical Impression(s) / ED Diagnoses Final diagnoses:  Chest pain, unspecified type  SOB  (shortness of breath) on exertion    Rx / DC Orders ED Discharge Orders     None         Maayan Jenning, Harley [place], PA 05/18/23 2304    Vanetta Mulders, MD 05/19/23 [phone]

## 2023-05-18 NOTE — ED Notes (Signed)
Pt assisted with ped pan. Pt urinated in bedpan. Pt comfortably resting in bed. Will continue care

## 2023-05-18 NOTE — ED Notes (Signed)
Patient transported to CT 

## 2023-05-18 NOTE — ED Notes (Signed)
CBG 128 

## 2023-05-19 NOTE — ED Notes (Signed)
Pt's significant other contacted and they are going to see about getting transportation for pt

## 2023-05-19 NOTE — ED Provider Notes (Signed)
I assumed care in signout All imaging negative Troponin and EKG unremarkable Pt has cardiology f/u this week She is ambulatory D/c home   Zadie Rhine, MD 05/19/23 [phone]

## 2023-05-19 NOTE — ED Notes (Signed)
Pt ambulated to the bathroom and back to room with standby assist from this RN and NT. Pt states is weak but no other signs of distress

## 2023-05-21 ENCOUNTER — Ambulatory Visit: Payer: Medicare PPO | Attending: Cardiology | Admitting: Cardiology

## 2023-05-21 ENCOUNTER — Encounter: Payer: Self-pay | Admitting: Cardiology

## 2023-05-21 VITALS — BP 102/58 | HR 68 | Resp 15 | Ht 60.0 in | Wt 144.2 lb

## 2023-05-21 DIAGNOSIS — R0609 Other forms of dyspnea: Secondary | ICD-10-CM | POA: Diagnosis not present

## 2023-05-21 DIAGNOSIS — E7800 Pure hypercholesterolemia, unspecified: Secondary | ICD-10-CM | POA: Diagnosis not present

## 2023-05-21 DIAGNOSIS — R072 Precordial pain: Secondary | ICD-10-CM

## 2023-05-21 DIAGNOSIS — I700 Atherosclerosis of aorta: Secondary | ICD-10-CM | POA: Diagnosis not present

## 2023-05-21 MED ORDER — ASPIRIN 81 MG PO CHEW
81.0000 mg | CHEWABLE_TABLET | Freq: Every day | ORAL | Status: DC
Start: 2023-05-21 — End: 2023-07-02

## 2023-05-21 MED ORDER — METOPROLOL TARTRATE 25 MG PO TABS: 25.0000 mg | ORAL_TABLET | ORAL | 0 refills | Status: DC

## 2023-05-21 NOTE — Progress Notes (Signed)
Cardiology Office Note:  .   Date:  05/21/2023  ID:  [name], DOB 02/16/1961, MRN [id number] PCP: [place], [name], MD   HeartCare Providers Cardiologist:  Yates Decamp, MD     History of Present Illness: [name]   [name] is a 62 y.o. female Caucasian female patient with bipolar disorder, anxiety, GERD, hypercholesterolemia, diet controlled diabetes mellitus type 2, benign paroxysmal peripheral vertigo, frequent falls of unclear etiology, multiple emergency room visits with multiple complaints, 5 visits in the last 1 month presents to establish cardiac care.  She has had negative nuclear stress testing in 2012 and 2015.  Discussed the use of AI scribe software for clinical note transcription with the patient, who gave verbal consent to proceed.  History of Present Illness   The patient, with a history of diabetes and mild cognitive impairment, presents with chest pain, shortness of breath, and extreme fatigue. These symptoms started about three weeks ago and have led to multiple emergency room visits in the past month. The chest pain is described as a squeezing pressure in the middle of the chest that comes and goes. The patient also experiences shortness of breath when attempting to perform activities such as laundry and cleaning. The patient has also reported excessive sweating and feeling extremely tired, even after minimal activity. The patient has been less active recently due to these symptoms and has expressed concern about falling while showering. The patient's wife has noticed increased withdrawal and depression in the patient over the past couple of years.      Review of Systems  Constitutional: Positive for malaise/fatigue.  Cardiovascular:  Positive for chest pain and dyspnea on exertion. Negative for leg swelling.    Physical Exam Neck:     Vascular: No carotid bruit or JVD.  Cardiovascular:     Rate and Rhythm: Normal rate and regular rhythm.     Pulses:  Intact distal pulses.     Heart sounds: Normal heart sounds. No murmur heard.    No gallop.  Pulmonary:     Effort: Pulmonary effort is normal.     Breath sounds: Normal breath sounds.  Abdominal:     General: Bowel sounds are normal.     Palpations: Abdomen is soft.  Musculoskeletal:     Right lower leg: No edema.     Left lower leg: No edema.    Risk Assessment/Calculations:     Lab Results  Component Value Date   CHOL 133 08/19/2022   HDL 41.60 08/19/2022   LDLCALC 54 05/15/2017   LDLDIRECT 64.0 08/19/2022   TRIG 230.0 (H) 08/19/2022   CHOLHDL 3 08/19/2022   Lab Results  Component Value Date   NA 136 05/18/2023   K 3.6 05/18/2023   CO2 24 05/18/2023   GLUCOSE 131 (H) 05/18/2023   BUN 9 05/18/2023   CREATININE 0.76 05/18/2023   CALCIUM 8.8 (L) 05/18/2023   GFR 71.96 05/01/2023   GFRNONAA >60 05/18/2023   Lab Results  Component Value Date   WBC 11.9 (H) 05/18/2023   HGB 12.9 05/18/2023   HCT 38.9 05/18/2023   MCV 94.9 05/18/2023   PLT 207 05/18/2023    Physical Exam:   VS:  BP (!) 102/58 (BP Location: Left Arm, Patient Position: Sitting, Cuff Size: Normal)   Pulse 68   Resp 15   Ht 5' (1.524 m)   Wt 144 lb 3.2 oz (65.4 kg)   SpO2 98%   BMI 28.16 kg/m  Wt Readings from Last 3 Encounters:  05/21/23 144 lb 3.2 oz (65.4 kg)  05/18/23 144 lb 0.1 oz (65.3 kg)  05/14/23 144 lb (65.3 kg)     Neck:     Vascular: No carotid bruit or JVD.  Pulmonary:     Effort: Pulmonary effort is normal.     Breath sounds: Normal breath sounds.  Cardiovascular:     Normal rate. Regular rhythm. Normal heart sounds.     No gallop.   Pulses:    Intact distal pulses.  Edema:    Pretibial: no edema of the left pretibial area and no edema of the right pretibial area. Abdominal:     General: Bowel sounds are normal.     Palpations: Abdomen is soft.      Studies Reviewed: .        CT angio chest 05/01/2023: 1. No evidence of arterial embolus or dilatation. 2. Mild  diffuse bronchial thickening. 3. Faint patchy ground-glass disease in the posterior right upper lobe apex extending inferiorly into the posterior segment, most likely post pneumonic ground-glass scarring, less likely recurrent pneumonitis. 4. Minimal aortic atherosclerosis. 5. Interval thyroidectomy. 6. Hepatic steatosis.  EKG 05/18/2023: Normal sinus rhythm at rate of 96 bpm, normal axis, no evidence of ischemia.  Frequent PACs (3).  ASSESSMENT AND PLAN: .      ICD-10-CM   1. Precordial pain  R07.2 CT CORONARY MORPH W/CTA COR W/SCORE W/CA W/CM &/OR WO/CM    ECHOCARDIOGRAM COMPLETE    metoprolol tartrate (LOPRESSOR) 25 MG tablet    2. Dyspnea on exertion  R06.09 CT CORONARY MORPH W/CTA COR W/SCORE W/CA W/CM &/OR WO/CM    ECHOCARDIOGRAM COMPLETE    3. Hypercholesteremia  E78.00     4. Aortic atherosclerosis (HCC)  I70.0 aspirin (ASPIRIN CHILDRENS) 81 MG chewable tablet      Assessment and Plan    Chest Pain Recurrent episodes of central chest pain described as squeezing and pressure, associated with shortness of breath and fatigue. Episodes are brief and occur with minimal exertion. No clear cardiac etiology identified yet. -Order CT angiogram of the chest to evaluate for coronary artery disease. -Continue aspirin 81mg  daily for mild aortic atherosclerosis.  Shortness of Breath Associated with minimal exertion and chest pain. No nocturnal symptoms. -Consider deconditioning as a potential cause. Encourage increased physical activity if cardiac workup is negative.  Hyperlipidemia Well controlled on pravastatin 20mg  daily. -Continue current regimen.  Prediabetes A1c within normal limits without medication. -Continue current lifestyle modifications.  Follow-up Plan for follow-up with a PA in a couple of months to reassess symptoms and review test results.      I have reviewed all the examination, she has aortic atherosclerosis by CT angiogram of the chest that was recently  done that would be her cardiovascular risk along with mild hypercholesterolemia and prediabetes mellitus/diabetes mellitus which is now resolved with weight loss and A1c 6.0%.  I discussed with the patient and her wife regarding CT angiogram of the chest that was done revealing no evidence of pulm embolism and no cancerous lesions that are suspicious, she is a non-smoker, also discussed regarding stress testing versus coronary CT angiogram, patient does not want Lexiscan to be administered as it makes her to feel extremely anxious and will proceed with obtaining coronary CT angiogram.  Advised her that if the CT angiogram is low risk and does not reveal any significant abnormality, she should resume her activity and increase her physical activity as tolerated.  I suspect  her dyspnea on exertion is related to deconditioning and lack of physical activity.  Patient felt reassured.  I will close the loop by seeing her back with one of our extenders in 2 to 3 months and if she remains stable and tests are low risk, she can be seen on a as needed basis. Signed,  Yates Decamp, MD, [facility] 05/21/2023, 10:56 AM

## 2023-05-21 NOTE — Patient Instructions (Addendum)
Medication Instructions:  Your physician recommends that you continue on your current medications as directed. Please refer to the Current Medication list given to you today.  You will take metoprolol tartrate (Lopressor) 25 mg twice daily the day before your CT scan, then take your last dose 2 hours before your CT scan.  *If you need a refill on your cardiac medications before your next appointment, please call your pharmacy*  Lab Work: None ordered today.  Testing/Procedures: Your physician has requested that you have cardiac CT. Cardiac computed tomography (CT) is a painless test that uses an x-ray machine to take clear, detailed pictures of your heart. For further information please visit [email or web address] Please follow instruction sheet as given.   Your physician has requested that you have an echocardiogram. Echocardiography is a painless test that uses sound waves to create images of your heart. It provides your doctor with information about the size and shape of your heart and how well your heart's chambers and valves are working. This procedure takes approximately one hour. There are no restrictions for this procedure. Please do NOT wear cologne, perfume, aftershave, or lotions (deodorant is allowed). Please arrive 15 minutes prior to your appointment time.  Follow-Up: At [facility], you and your health needs are our priority.  As part of our continuing mission to provide you with exceptional heart care, we have created designated Provider Care Teams.  These Care Teams include your primary Cardiologist (physician) and Advanced Practice Providers (APPs -  Physician Assistants and Nurse Practitioners) who all work together to provide you with the care you need, when you need it.  Your next appointment:   2-3 month(s)  The format for your next appointment:   In Person  Provider:   Jari Favre, PA-C, Ronie Spies, PA-C, Robin Searing, NP, Tereso Newcomer, PA-C, or Perlie Gold, PA-C      Then, Yates Decamp, MD will plan to see you again as needed.  Other Instructions   Your cardiac CT will be scheduled at:   [facility] [address] [place], [place] [address] [phone]  Please arrive at the [facility] and Children's Entrance (Entrance C2) of [facility] 30 minutes prior to test start time. You can use the FREE valet parking offered at entrance C (encouraged to control the heart rate for the test)  Proceed to the [facility] Radiology Department (first floor) to check-in and test prep.  All radiology patients and guests should use entrance C2 at [facility], accessed from [facility], even though the hospital's physical address listed is [address]   Please follow these instructions carefully (unless otherwise directed):  An IV will be required for this test and Nitroglycerin will be given.   On the Night Before the Test: Be sure to Drink plenty of water. Do not consume any caffeinated/decaffeinated beverages or chocolate 12 hours prior to your test. Do not take any antihistamines 12 hours prior to your test.  On the Day of the Test: Drink plenty of water until 1 hour prior to the test. Do not eat any food 1 hour prior to test. You may take your regular medications prior to the test.  Take metoprolol (Lopressor) two hours prior to test. If you take Furosemide/Hydrochlorothiazide/Spironolactone, please HOLD on the morning of the test. FEMALES- please wear underwire-free bra if available, avoid dresses & tight clothing  After the Test: Drink plenty of water. After receiving IV contrast, you may experience a mild flushed  feeling. This is normal. On occasion, you may experience a mild rash up to 24 hours after the test. This is not dangerous. If this occurs, you can take Benadryl 25 mg and increase your fluid intake. If you experience trouble breathing, this can be serious. If it is severe call 911  IMMEDIATELY. If it is mild, please call our office. If you take any of these medications: Glipizide/Metformin, Avandament, Glucavance, please do not take 48 hours after completing test unless otherwise instructed.  We will call to schedule your test 2-4 weeks out understanding that some insurance companies will need an authorization prior to the service being performed.   For more information and frequently asked questions, please visit our website : [email or web address]  For non-scheduling related questions, please contact the cardiac imaging nurse navigator should you have any questions/concerns: Cardiac Imaging Nurse Navigators Direct Office Dial: [phone]   For scheduling needs, including cancellations and rescheduling, please call [place], [phone].

## 2023-05-22 ENCOUNTER — Ambulatory Visit (INDEPENDENT_AMBULATORY_CARE_PROVIDER_SITE_OTHER): Payer: Medicare PPO | Admitting: Family Medicine

## 2023-05-22 ENCOUNTER — Encounter: Payer: Self-pay | Admitting: Family Medicine

## 2023-05-22 VITALS — BP 128/80 | HR 85 | Resp 16 | Ht 60.0 in | Wt 147.4 lb

## 2023-05-22 DIAGNOSIS — K219 Gastro-esophageal reflux disease without esophagitis: Secondary | ICD-10-CM | POA: Diagnosis not present

## 2023-05-22 DIAGNOSIS — R0609 Other forms of dyspnea: Secondary | ICD-10-CM | POA: Diagnosis not present

## 2023-05-22 DIAGNOSIS — I700 Atherosclerosis of aorta: Secondary | ICD-10-CM | POA: Diagnosis not present

## 2023-05-22 DIAGNOSIS — R0789 Other chest pain: Secondary | ICD-10-CM | POA: Diagnosis not present

## 2023-05-22 DIAGNOSIS — J988 Other specified respiratory disorders: Secondary | ICD-10-CM

## 2023-05-22 MED ORDER — OMEPRAZOLE 40 MG PO CPDR: 40.0000 mg | DELAYED_RELEASE_CAPSULE | Freq: Every day | ORAL | 0 refills | Status: AC

## 2023-05-22 MED ORDER — DOXYCYCLINE HYCLATE 100 MG PO TABS
100.0000 mg | ORAL_TABLET | Freq: Two times a day (BID) | ORAL | 0 refills | Status: AC
Start: 2023-05-22 — End: 2023-05-29

## 2023-05-22 NOTE — Assessment & Plan Note (Signed)
She is not reporting symptoms but this problem could be causing or contributing to her CP. She agrees with PPI trial, Omeprazole 40 mg daily for 8 weeks. GERD precautions to continue.

## 2023-05-22 NOTE — Progress Notes (Signed)
ACUTE VISIT Chief Complaint  Patient presents with   Follow-up   HPI: [name] is a 62 y.o. female with a PMHx significant for HLD, DM II, bipolar disorder, and mild cognitive impairment, who is here today for ER follow up for recurring chest pain. Last seen on 05/08/2023. Has been to ER three times since last visit for CP, SOB, and night sweats. Followed with cardiology yesterday, Dr [name]. Started on Metoprolol tartrate 25 mg bid.  Coronary CTA is being arranged. Still having episodes of mid sternal CP, not radiated,and not related with exertion. She denies heartburn or acid reflux.  Lab Results  Component Value Date   DDIMER 0.64 (H) 05/18/2023   Chest CTA:  1. No evidence of arterial embolus or dilatation. 2. Mild diffuse bronchial thickening. 3. Faint patchy ground-glass disease in the posterior right upper lobe apex extending inferiorly into the posterior segment, most likely post pneumonic ground-glass scarring, less likely recurrent pneumonitis. 4. Minimal aortic atherosclerosis. 5. Interval thyroidectomy. 6. Hepatic steatosis.  Lab Results  Component Value Date   WBC 11.9 (H) 05/18/2023   HGB 12.9 05/18/2023   HCT 38.9 05/18/2023   MCV 94.9 05/18/2023   PLT 207 05/18/2023   Lab Results  Component Value Date   NA 136 05/18/2023   CL 103 05/18/2023   K 3.6 05/18/2023   CO2 24 05/18/2023   BUN 9 05/18/2023   CREATININE 0.76 05/18/2023   GFRNONAA >60 05/18/2023   CALCIUM 8.8 (L) 05/18/2023   PHOS 2.8 05/01/2014   ALBUMIN 3.8 05/11/2023   GLUCOSE 131 (H) 05/18/2023   Lab Results  Component Value Date   ALT 32 05/11/2023   AST 36 05/11/2023   ALKPHOS 63 05/11/2023   BILITOT 0.7 05/11/2023   Troponin x 3 within the past 11 days, all in normal range.  She thinks she has a pulmonary infection. Her wife has some questions about one of CXR report. She endorses associated night sweats, productive coughing since 9/23, and had a subjective fever on  9/26.  She denies hemoptysis. Reports greenish sputum. No associated wheezing. SOB exacerbated by exertion. She has smoked in the past.   CXR done on 9/23,9/26,and 05/18/23. CXR 05/14/23 showed : 1. No active cardiopulmonary disease. 2. Unchanged elevation of the right hemidiaphragm.  Review of Systems  Constitutional:  Positive for fatigue. Negative for chills.  HENT:  Positive for postnasal drip. Negative for sore throat.   Cardiovascular:  Negative for palpitations and leg swelling.  Gastrointestinal:  Negative for abdominal pain, nausea and vomiting.  Genitourinary:  Negative for decreased urine volume, dysuria and hematuria.  Musculoskeletal:  Positive for gait problem.  Skin:  Negative for rash.  Neurological:  Negative for syncope and facial asymmetry.  See other pertinent positives and negatives in HPI.  Current Outpatient Medications on File Prior to Visit  Medication Sig Dispense Refill   Accu-Chek Softclix Lancets lancets USE TO CHECK BLOOD SUGARS 1-2 TIMES DAILY 200 each 2   albuterol (VENTOLIN HFA) 108 (90 Base) MCG/ACT inhaler Inhale 2 puffs into the lungs every 6 (six) hours as needed for wheezing or shortness of breath. 18 g 1   Alcohol Swabs (ALCOHOL PREP) PADS To use for blood sugar checks. 100 each 3   aspirin (ASPIRIN CHILDRENS) 81 MG chewable tablet Chew 1 tablet (81 mg total) by mouth daily.     aspirin-acetaminophen-caffeine (EXCEDRIN MIGRAINE) 250-250-65 MG tablet Take 2 tablets by mouth every 6 (six) hours as needed for headache.  AUVELITY 45-105 MG TBCR Take 1 tablet by mouth every morning.     Blood Glucose Monitoring Suppl (ONE TOUCH ULTRA 2) w/Device KIT Use to test blood sugars 1-2 times daily. 1 kit 0   divalproex (DEPAKOTE ER) 250 MG 24 hr tablet Take 750 mg by mouth at bedtime.     glucose blood (ACCU-CHEK GUIDE) test strip Use to test blood sugars 1-2 times daily. 200 each 2   levothyroxine (SYNTHROID) 100 MCG tablet Take 1 tablet (100 mcg total) by  mouth daily. 90 tablet 3   meclizine (ANTIVERT) 25 MG tablet Take 1 tablet (25 mg total) by mouth 3 (three) times daily as needed for dizziness. 30 tablet 0   metoprolol tartrate (LOPRESSOR) 25 MG tablet Take 1 tablet (25 mg total) by mouth as directed for 3 doses. Take 1 tablet twice daily 1 day before CT scan and 1 tablet before CT scan 3 tablet 0   Multiple Vitamin (MULTIVITAMIN WITH MINERALS) TABS tablet Take 1 tablet by mouth daily.     ondansetron (ZOFRAN) 4 MG tablet Take 1 tablet (4 mg total) by mouth every 8 (eight) hours as needed for nausea or vomiting. 20 tablet 0   pravastatin (PRAVACHOL) 20 MG tablet Take 1 tablet by mouth once daily 90 tablet 1   terbinafine (LAMISIL) 250 MG tablet Take 1 tablet (250 mg total) by mouth daily. 90 tablet 0   No current facility-administered medications on file prior to visit.    Past Medical History:  Diagnosis Date   Abnormal LFTs 04/06/2013   Very minor tried to reassure okay to repeat today with hepatitis C screen   Allergic rhinitis 03/26/2007   Autonomic dysfunction 01/11/2015   Bipolar 1 disorder    Hx of psychosis; present since age 24   Chest pain on breathing 03/16/2014   Chronic fatigue 06/21/2017   Chronic interstitial cystitis 11/15/2020   Community acquired pneumonia of right lung 08/17/2020   Complication of anesthesia    agitation   Constipation 05/17/2012   Costochondritis 02/14/2014   Diverticular disease of colon 08/06/2020   Double vision 03/15/2011   Dyspnea on effort 01/10/2011   Dysuria 02/15/2013   Early satiety 04/27/2013   Elevated lactic acid level    Endometriosis 05/11/2008   Fatty liver disease, nonalcoholic 02/13/2014   Fecal urgency 08/06/2020   Female stress incontinence 11/15/2020   Food aversion 11/08/2012   Frequency of urination    Gastroesophageal reflux disease 08/06/2020   Headache    History of duodenal ulcer    History of hysterectomy 11/15/2020   History of kidney stones    History of  lithium toxicity 05/01/2014   Hoarseness of voice 02/27/2016   Hyperglycemia 01/10/2011   Hyperlipidemia, mixed 05/12/2017   Hyperprolactinemia 06/26/2015   Insomnia 05/12/2017   Irritable bowel syndrome 08/06/2020   Jaw pain 02/13/2012   poss tmj area     Left upper quadrant pain 08/06/2020   Leukocytosis 05/01/2014   Lump of breast, left 12/20/2012   about 6 oclock    Mass of urethra 11/15/2020   Mild cognitive impairment of uncertain or unknown etiology 10/16/2020   Multinodular goiter 05/12/2017   Nocturia    Nondiabetic gastroparesis 01/11/2015   Noninvasive follicular neoplasm of thyroid with papillary-like nuclear features 10/10/2021   Palpitations 12/08/2010   Pelvic pain    Periumbilical pain 08/06/2020   Plantar fasciitis 06/11/2007   Postsurgical hypothyroidism 09/03/2021   Pruritus ani 08/06/2020   Right ear pain 02/16/2012  Social anxiety disorder    Subacute bronchitis 07/10/2018   Type II diabetes mellitus 10/10/2021   Unstable gait 07/05/2022   Urgency of urination    Vitamin B12 deficiency 09/03/2021   Vitamin D deficiency 09/23/2011   Allergies  Allergen Reactions   Morphine And Codeine Nausea And Vomiting and Nausea Only   Ambien [Zolpidem] Other (See Comments)    Per patient this caused her to fall   Duloxetine Swelling    Edema   Latuda [Lurasidone Hcl]    Morphine Nausea Only   Shingrix [Zoster Vac Recomb Adjuvanted]     Dizziness vomiting    Social History   Socioeconomic History   Marital status: Married    Spouse name: Not on file   Number of children: 0   Years of education: 16   Highest education level: Bachelor's degree (e.g., BA, AB, BS)  Occupational History   Occupation: Disability    Comment: Nurse  Tobacco Use   Smoking status: Former    Current packs/day: 0.00    Types: Cigarettes    Start date: 1979    Quit date: 1980    Years since quitting: 44.7   Smokeless tobacco: Never   Tobacco comments:    ONLY SMOKED FOR 6  MONTHS --  QUIT  YRS AGO  Vaping Use   Vaping status: Never Used  Substance and Sexual Activity   Alcohol use: Not Currently    Comment: occ   Drug use: Never   Sexual activity: Not on file  Other Topics Concern   Not on file  Social History Narrative   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    no tobacco   Caffeine Use: very little, three times a week   Left handed    Social Determinants of Health   Financial Resource Strain: Low Risk  (05/20/2023)   Overall Financial Resource Strain (CARDIA)    Difficulty of Paying Living Expenses: Not hard at all  Food Insecurity: No Food Insecurity (05/20/2023)   Hunger Vital Sign    Worried About Running Out of Food in the Last Year: Never true    Ran Out of Food in the Last Year: Never true  Transportation Needs: No Transportation Needs (05/20/2023)   PRAPARE - Administrator, Civil Service (Medical): No    Lack of Transportation (Non-Medical): No  Physical Activity: Insufficiently Active (05/20/2023)   Exercise Vital Sign    Days of Exercise per Week: 1 day    Minutes of Exercise per Session: 10 min  Stress: Stress Concern Present (05/20/2023)   [employer] of Occupational Health - Occupational Stress Questionnaire    Feeling of Stress : To some extent  Social Connections: Socially Isolated (05/20/2023)   Social Connection and Isolation Panel [NHANES]    Frequency of Communication with Friends and Family: Never    Frequency of Social Gatherings with Friends and Family: Once a week    Attends Religious Services: Never    Database administrator or Organizations: No    Attends Banker Meetings: Never    Marital Status: Married   Vitals:   05/22/23 1035  BP: 128/80  Pulse: 85  Resp: 16  SpO2: 95%   Body mass index is 28.78 kg/m.  Physical Exam Vitals and nursing note reviewed.   Constitutional:      General:  She is not in acute distress.    Appearance: She is well-developed.  HENT:     Head: Normocephalic and atraumatic.     Mouth/Throat:     Mouth: Mucous membranes are dry.     Pharynx: Oropharynx is clear.  Eyes:     Conjunctiva/sclera: Conjunctivae normal.  Cardiovascular:     Rate and Rhythm: Normal rate and regular rhythm.     Heart sounds: No murmur heard.    Comments: DP pulses palpable. Pulmonary:     Effort: Pulmonary effort is normal. No respiratory distress.     Breath sounds: Normal breath sounds.  Chest:     Chest wall: No tenderness.  Abdominal:     Palpations: Abdomen is soft. There is no mass.     Tenderness: There is no abdominal tenderness.  Musculoskeletal:     Right lower leg: No edema.     Left lower leg: No edema.  Skin:    General: Skin is warm.     Findings: No erythema or rash.  Neurological:     General: No focal deficit present.     Mental Status: She is alert. Mental status is at baseline.     Gait: Gait normal.     Comments: Stable gait, not assisted.  Psychiatric:        Mood and Affect: Affect normal. Mood is anxious.    ASSESSMENT AND PLAN:  Ms. Mertens was seen today for chest pain.   Other chest pain We discussed possible causes.Hx os not very suggestive of cardiac etiology, she has pending a coronary CTA. ? GERD, musculoskeletal. Instructed about warning signs.  DOE (dyspnea on exertion) Problem has been going on for some time, Dx on PMHx of dyspnea on effort in 12/2010. ? Deconditioning, COPD among some to consider. Otherwise stable.  Respiratory tract infection She is concerned about bacterial infection. CXR and chest CTA report reviewed. Doxycycline recommended, side effects discussed. She understand that if cough is caused by a viral infection or allergies, abx will not help.  -     Doxycycline Hyclate; Take 1 tablet (100 mg total) by mouth 2 (two) times daily for 7 days.  Dispense: 14 tablet;  Refill: 0  Atherosclerosis of aorta [facility]) Assessment & Plan: Seen in recent chest CTA, 05/19/23. Last LDL 41 in 08/2022. She is on Pravastatin 20 mg daily and Aspirin 81 mg daily.  Gastroesophageal reflux disease, unspecified whether esophagitis present Assessment & Plan: She is not reporting symptoms but this problem could be causing or contributing to her CP. She agrees with PPI trial, Omeprazole 40 mg daily for 8 weeks. GERD precautions to continue.  Orders: -     Omeprazole; Take 1 capsule (40 mg total) by mouth daily.  Dispense: 60 capsule; Refill: 0  I spent a total of 43 minutes in both face to face and non face to face activities for this visit on the date of this encounter. During this time history was obtained and documented, examination was performed, prior labs/imaging reviewed, and assessment/plan discussed. Reviewed ED visits, note for recent appt with cardiologist.  Return if symptoms worsen or fail to improve, for keep next appointment.  I, Rolla Etienne Wierda, acting as a scribe for Maximos Zayas [place], MD., have documented all relevant documentation on the behalf of Darrian Goodwill [place], MD, as directed by  Khrystian Schauf [place], MD while in the presence of Javien Tesch [place], MD.   I, Rehema Muffley [place], MD, have reviewed all documentation for this visit. The documentation on 05/22/23  for the exam, diagnosis, procedures, and orders are all accurate and complete.  Toretto Tingler G. [place], MD  [facility]. [place] office.

## 2023-05-22 NOTE — Patient Instructions (Addendum)
A few things to remember from today's visit:  Other chest pain  DOE (dyspnea on exertion)  Respiratory tract infection - Plan: doxycycline (VIBRA-TABS) 100 MG tablet  Let's try antibiotic for possible respiratory infection, will not help if it is viral. Trial of Omeprazole 40 mg for 6-8 weeks to treat possible acid reflux as a cause of chest discomfort.  If you need refills for medications you take chronically, please call your pharmacy. Do not use My Chart to request refills or for acute issues that need immediate attention. If you send a my chart message, it may take a few days to be addressed, specially if I am not in the office.  Please be sure medication list is accurate. If a new problem present, please set up appointment sooner than planned today.

## 2023-05-22 NOTE — Assessment & Plan Note (Signed)
Seen in recent chest CTA, 05/19/23. Last LDL 41 in 08/2022. She is on Pravastatin 20 mg daily and Aspirin 81 mg daily.

## 2023-05-29 ENCOUNTER — Encounter: Payer: Self-pay | Admitting: Family Medicine

## 2023-05-29 ENCOUNTER — Ambulatory Visit (INDEPENDENT_AMBULATORY_CARE_PROVIDER_SITE_OTHER): Payer: Medicare PPO | Admitting: Family Medicine

## 2023-05-29 ENCOUNTER — Ambulatory Visit: Payer: Medicare PPO

## 2023-05-29 VITALS — BP 120/80 | HR 94 | Temp 97.6°F | Resp 16 | Ht 60.0 in | Wt 144.2 lb

## 2023-05-29 DIAGNOSIS — R61 Generalized hyperhidrosis: Secondary | ICD-10-CM | POA: Diagnosis not present

## 2023-05-29 DIAGNOSIS — R296 Repeated falls: Secondary | ICD-10-CM | POA: Diagnosis not present

## 2023-05-29 DIAGNOSIS — I700 Atherosclerosis of aorta: Secondary | ICD-10-CM | POA: Diagnosis not present

## 2023-05-29 DIAGNOSIS — R0989 Other specified symptoms and signs involving the circulatory and respiratory systems: Secondary | ICD-10-CM

## 2023-05-29 DIAGNOSIS — N6489 Other specified disorders of breast: Secondary | ICD-10-CM | POA: Diagnosis not present

## 2023-05-29 NOTE — Progress Notes (Signed)
ACUTE VISIT Chief Complaint  Patient presents with   Breast Problem    Bruised & red from fall   HPI: Ms.Jakai E Lessner is a 62 y.o. female with a PMHx significant for HLD, DM II, bipolar disorder, and mild cognitive impairment, who is here today with her wife concerned about left breast color changes noted after a fall. She has had several falls, the most recent,05/27/23, fell from bed, left side,she remembers hitting left side of her body with corner of night table. No head trauma or LOC. Later she noted some left breast discomfort, his wife noted skin color changes. Negative for breast masses or nipple discharge. Last mammogram at her gynecologist's office 2022. She has not tried OTC medications.  Unstable gait, has followed with neurologist and has done PT.  She mentions that yesterday she was outdoors walking her dog, which she adds has not done in long time, felt diaphoretic, O2 sats dropped to 90%, no associated CP or palpitation. Mild SOB. She has followed with cardiologist and pending coronary CTA.  Last visit she was started on Doxycycline for persistent cough. States that her cough is productive and loose, cannot bring sputum up but can swallow easily. Negative fro fever,wheezing.  Chest CTA 05/19/23:  1. No evidence of arterial embolus or dilatation. 2. Mild diffuse bronchial thickening. 3. Faint patchy ground-glass disease in the posterior right upper lobe apex extending inferiorly into the posterior segment, most likely post pneumonic ground-glass scarring, less likely recurrent pneumonitis.  Review of Systems  Constitutional:  Positive for fatigue. Negative for activity change and appetite change.  HENT:  Negative for sore throat and trouble swallowing.   Cardiovascular:  Negative for leg swelling.  Gastrointestinal:  Negative for abdominal pain, nausea and vomiting.  Genitourinary:  Negative for decreased urine volume, dysuria and hematuria.  Skin:  Negative for  wound.  Neurological:  Negative for syncope and headaches.  Psychiatric/Behavioral:  Negative for hallucinations. The patient is nervous/anxious.   See other pertinent positives and negatives in HPI.  Current Outpatient Medications on File Prior to Visit  Medication Sig Dispense Refill   Accu-Chek Softclix Lancets lancets USE TO CHECK BLOOD SUGARS 1-2 TIMES DAILY 200 each 2   Alcohol Swabs (ALCOHOL PREP) PADS To use for blood sugar checks. 100 each 3   aspirin (ASPIRIN CHILDRENS) 81 MG chewable tablet Chew 1 tablet (81 mg total) by mouth daily.     aspirin-acetaminophen-caffeine (EXCEDRIN MIGRAINE) 250-250-65 MG tablet Take 2 tablets by mouth every 6 (six) hours as needed for headache.     AUVELITY 45-105 MG TBCR Take 1 tablet by mouth every morning.     Blood Glucose Monitoring Suppl (ONE TOUCH ULTRA 2) w/Device KIT Use to test blood sugars 1-2 times daily. 1 kit 0   divalproex (DEPAKOTE ER) 250 MG 24 hr tablet Take 750 mg by mouth at bedtime.     glucose blood (ACCU-CHEK GUIDE) test strip Use to test blood sugars 1-2 times daily. 200 each 2   levothyroxine (SYNTHROID) 100 MCG tablet Take 1 tablet (100 mcg total) by mouth daily. 90 tablet 3   meclizine (ANTIVERT) 25 MG tablet Take 1 tablet (25 mg total) by mouth 3 (three) times daily as needed for dizziness. 30 tablet 0   metoprolol tartrate (LOPRESSOR) 25 MG tablet Take 1 tablet (25 mg total) by mouth as directed for 3 doses. Take 1 tablet twice daily 1 day before CT scan and 1 tablet before CT scan 3 tablet 0  Multiple Vitamin (MULTIVITAMIN WITH MINERALS) TABS tablet Take 1 tablet by mouth daily.     omeprazole (PRILOSEC) 40 MG capsule Take 1 capsule (40 mg total) by mouth daily. 60 capsule 0   ondansetron (ZOFRAN) 4 MG tablet Take 1 tablet (4 mg total) by mouth every 8 (eight) hours as needed for nausea or vomiting. 20 tablet 0   pravastatin (PRAVACHOL) 20 MG tablet Take 1 tablet by mouth once daily 90 tablet 1   terbinafine (LAMISIL) 250  MG tablet Take 1 tablet (250 mg total) by mouth daily. 90 tablet 0   albuterol (VENTOLIN HFA) 108 (90 Base) MCG/ACT inhaler Inhale 2 puffs into the lungs every 6 (six) hours as needed for wheezing or shortness of breath. 18 g 1   No current facility-administered medications on file prior to visit.   Past Medical History:  Diagnosis Date   Abnormal LFTs 04/06/2013   Very minor tried to reassure okay to repeat today with hepatitis C screen   Allergic rhinitis 03/26/2007   Autonomic dysfunction 01/11/2015   Bipolar 1 disorder    Hx of psychosis; present since age 76   Chest pain on breathing 03/16/2014   Chronic fatigue 06/21/2017   Chronic interstitial cystitis 11/15/2020   Community acquired pneumonia of right lung 08/17/2020   Complication of anesthesia    agitation   Constipation 05/17/2012   Costochondritis 02/14/2014   Diverticular disease of colon 08/06/2020   Double vision 03/15/2011   Dyspnea on effort 01/10/2011   Dysuria 02/15/2013   Early satiety 04/27/2013   Elevated lactic acid level    Endometriosis 05/11/2008   Fatty liver disease, nonalcoholic 02/13/2014   Fecal urgency 08/06/2020   Female stress incontinence 11/15/2020   Food aversion 11/08/2012   Frequency of urination    Gastroesophageal reflux disease 08/06/2020   Headache    History of duodenal ulcer    History of hysterectomy 11/15/2020   History of kidney stones    History of lithium toxicity 05/01/2014   Hoarseness of voice 02/27/2016   Hyperglycemia 01/10/2011   Hyperlipidemia, mixed 05/12/2017   Hyperprolactinemia 06/26/2015   Insomnia 05/12/2017   Irritable bowel syndrome 08/06/2020   Jaw pain 02/13/2012   poss tmj area     Left upper quadrant pain 08/06/2020   Leukocytosis 05/01/2014   Lump of breast, left 12/20/2012   about 6 oclock    Mass of urethra 11/15/2020   Mild cognitive impairment of uncertain or unknown etiology 10/16/2020   Multinodular goiter 05/12/2017   Nocturia     Nondiabetic gastroparesis 01/11/2015   Noninvasive follicular neoplasm of thyroid with papillary-like nuclear features 10/10/2021   Palpitations 12/08/2010   Pelvic pain    Periumbilical pain 08/06/2020   Plantar fasciitis 06/11/2007   Postsurgical hypothyroidism 09/03/2021   Pruritus ani 08/06/2020   Right ear pain 02/16/2012   Social anxiety disorder    Subacute bronchitis 07/10/2018   Type II diabetes mellitus 10/10/2021   Unstable gait 07/05/2022   Urgency of urination    Vitamin B12 deficiency 09/03/2021   Vitamin D deficiency 09/23/2011   Allergies  Allergen Reactions   Morphine And Codeine Nausea And Vomiting and Nausea Only   Ambien [Zolpidem] Other (See Comments)    Per patient this caused her to fall   Duloxetine Swelling    Edema   Latuda [Lurasidone Hcl]    Morphine Nausea Only   Shingrix [Zoster Vac Recomb Adjuvanted]     Dizziness vomiting    Social History  Socioeconomic History   Marital status: Married    Spouse name: Not on file   Number of children: 0   Years of education: 16   Highest education level: Bachelor's degree (e.g., BA, AB, BS)  Occupational History   Occupation: Disability    Comment: Nurse  Tobacco Use   Smoking status: Former    Current packs/day: 0.00    Types: Cigarettes    Start date: 1979    Quit date: 1980    Years since quitting: 44.8   Smokeless tobacco: Never   Tobacco comments:    ONLY SMOKED FOR 6 MONTHS --  QUIT  YRS AGO  Vaping Use   Vaping status: Never Used  Substance and Sexual Activity   Alcohol use: Not Currently    Comment: occ   Drug use: Never   Sexual activity: Not on file  Other Topics Concern   Not on file  Social History Narrative   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    no tobacco   Caffeine Use: very little, three times a week   Left handed    Social Determinants  of Health   Financial Resource Strain: Low Risk  (05/20/2023)   Overall Financial Resource Strain (CARDIA)    Difficulty of Paying Living Expenses: Not hard at all  Food Insecurity: No Food Insecurity (05/20/2023)   Hunger Vital Sign    Worried About Running Out of Food in the Last Year: Never true    Ran Out of Food in the Last Year: Never true  Transportation Needs: No Transportation Needs (05/20/2023)   PRAPARE - Administrator, Civil Service (Medical): No    Lack of Transportation (Non-Medical): No  Physical Activity: Insufficiently Active (05/20/2023)   Exercise Vital Sign    Days of Exercise per Week: 1 day    Minutes of Exercise per Session: 10 min  Stress: Stress Concern Present (05/20/2023)   [employer] of Occupational Health - Occupational Stress Questionnaire    Feeling of Stress : To some extent  Social Connections: Socially Isolated (05/20/2023)   Social Connection and Isolation Panel [NHANES]    Frequency of Communication with Friends and Family: Never    Frequency of Social Gatherings with Friends and Family: Once a week    Attends Religious Services: Never    Database administrator or Organizations: No    Attends Banker Meetings: Never    Marital Status: Married    Vitals:   05/29/23 1541  BP: 120/80  Pulse: 94  Resp: 16  Temp: 97.6 F (36.4 C)  SpO2: 91%   Wt Readings from Last 3 Encounters:  05/29/23 144 lb 4 oz (65.4 kg)  05/22/23 147 lb 6 oz (66.8 kg)  05/21/23 144 lb 3.2 oz (65.4 kg)   Body mass index is 28.17 kg/m.  Physical Exam Vitals and nursing note reviewed.  Constitutional:      General: She is not in acute distress.    Appearance: She is well-developed.  HENT:     Head: Normocephalic and atraumatic.  Eyes:     Conjunctiva/sclera: Conjunctivae normal.  Pulmonary:     Effort: Pulmonary effort is normal. No respiratory distress.     Breath sounds: Transmitted upper airway sounds present. Rales (course rigth  basilar rales.) present. No decreased breath sounds or wheezing.  Chest:  Lymphadenopathy:     Cervical: No cervical adenopathy.  Skin:    General: Skin is warm.     Findings: Ecchymosis present. No erythema or rash.  Neurological:     Mental Status: She is alert and oriented to person, place, and time.     Comments: Mildly unstable gait, not assisted.  Psychiatric:        Mood and Affect: Affect normal. Mood is anxious.    ASSESSMENT AND PLAN  Ms. Toppin was seen today for redness in her left breast after a fall.  Lab Results  Component Value Date   WBC 8.7 05/29/2023   HGB 14.0 05/29/2023   HCT 43.2 05/29/2023   MCV 97 05/29/2023   PLT 278 05/29/2023   Lab Results  Component Value Date   CRP 1 05/29/2023   Breast hematoma After blunt trauma, recent fall. We discussed Dx, it will take a few weeks to resolved. Continue monitoring for changes.  -     CBC with Differential/Platelet; Future  Chest rales Currently on abx. CXR ordered today, I do not appreciate consolidations or infiltrate. Peribronchial congestion and elevation of right diaphragma. No significant changes when compared with CXR done 05/18/23. Instructed about warning signs.  -     DG Chest 2 View; Future  Diaphoresis We reviewed possible etiologies. Last TSH normal at 1.9 in 04/2023.  Currently following with cardiologist to evaluate for CVD. No recent animal exposure, travel,fever,or abnormal wt loss. May consider ID consultation if problem is persistent. Further recommendations according to lab results.  -     CBC with Differential/Platelet; Future -     C-reactive protein; Future  Frequent falls Fall precautions discussed.  Return if symptoms worsen or fail to improve, for keep next appointment.  Valia Wingard G. [place], MD  [facility]. [place] office.

## 2023-05-29 NOTE — Patient Instructions (Addendum)
A few things to remember from today's visit:  Diaphoresis - Plan: CBC with Differential/Platelet, C-reactive protein  Breast hematoma - Plan: CBC with Differential/Platelet  Frequent falls  Chest rales - Plan: DG Chest 2 View  If you need refills for medications you take chronically, please call your pharmacy. Do not use My Chart to request refills or for acute issues that need immediate attention. If you send a my chart message, it may take a few days to be addressed, specially if I am not in the office.  Please be sure medication list is accurate. If a new problem present, please set up appointment sooner than planned today.

## 2023-05-30 ENCOUNTER — Encounter: Payer: Self-pay | Admitting: Family Medicine

## 2023-05-30 LAB — CBC WITH DIFFERENTIAL/PLATELET
Basophils Absolute: 0.1 10*3/uL (ref 0.0–0.2)
Basos: 1 %
EOS (ABSOLUTE): 0.2 10*3/uL (ref 0.0–0.4)
Eos: 3 %
Hematocrit: 43.2 % (ref 34.0–46.6)
Hemoglobin: 14 g/dL (ref 11.1–15.9)
Immature Grans (Abs): 0.1 10*3/uL (ref 0.0–0.1)
Immature Granulocytes: 1 %
Lymphocytes Absolute: 2.2 10*3/uL (ref 0.7–3.1)
Lymphs: 25 %
MCH: 31.4 pg (ref 26.6–33.0)
MCHC: 32.4 g/dL (ref 31.5–35.7)
MCV: 97 fL (ref 79–97)
Monocytes Absolute: 0.8 10*3/uL (ref 0.1–0.9)
Monocytes: 9 %
Neutrophils Absolute: 5.4 10*3/uL (ref 1.4–7.0)
Neutrophils: 61 %
Platelets: 278 10*3/uL (ref 150–450)
RBC: 4.46 x10E6/uL (ref 3.77–5.28)
RDW: 12.6 % (ref 11.7–15.4)
WBC: 8.7 10*3/uL (ref 3.4–10.8)

## 2023-05-30 LAB — C-REACTIVE PROTEIN: CRP: 1 mg/L (ref 0–10)

## 2023-06-01 ENCOUNTER — Ambulatory Visit (HOSPITAL_COMMUNITY)
Admission: RE | Admit: 2023-06-01 | Discharge: 2023-06-01 | Disposition: A | Discharge status: Home / Self Care | Payer: Medicare PPO | Source: Ambulatory Visit | Attending: Cardiology | Admitting: Cardiology

## 2023-06-01 DIAGNOSIS — R0609 Other forms of dyspnea: Secondary | ICD-10-CM | POA: Insufficient documentation

## 2023-06-01 DIAGNOSIS — R072 Precordial pain: Secondary | ICD-10-CM | POA: Insufficient documentation

## 2023-06-01 MED ORDER — NITROGLYCERIN 0.4 MG SL SUBL
SUBLINGUAL_TABLET | SUBLINGUAL | Status: AC
  Filled 2023-06-01: qty 2

## 2023-06-01 MED ORDER — IOHEXOL 350 MG/ML SOLN
100.0000 mL | Freq: Once | INTRAVENOUS | Status: AC | PRN
  Administered 2023-06-01: 100 mL via INTRAVENOUS

## 2023-06-01 MED ORDER — NITROGLYCERIN 0.4 MG SL SUBL
0.8000 mg | SUBLINGUAL_TABLET | Freq: Once | SUBLINGUAL | Status: AC
  Administered 2023-06-01: 0.8 mg via SUBLINGUAL

## 2023-06-01 NOTE — Progress Notes (Signed)
During CT scan, contrast started to leak out and ended up getting on patients head and back. Pt provided a new gown and towels with soap water to clean up. All tubing re-secured and scan completed. Pt was apologized to and receptive. Pt wheeled out to parking with her friend/driver.

## 2023-06-02 ENCOUNTER — Telehealth: Payer: Self-pay | Admitting: Family Medicine

## 2023-06-02 NOTE — Telephone Encounter (Addendum)
Pt was last seen on 05/29/23.  Pt was prescribed abx and is now experiencing symptoms of yeast infection. Pt is requesting another Rx for abx, to treat symptoms.   Pt is also having irritation in her mouth, and is requesting a rinse.  Pt has also been coughing and taking mucinex, but it does not seem to be working and is asking if MD could please send something for cough?  Pt would also like a call back to go over her X-ray results.   [facility] [address] [place] - [place] [address] Phone: [phone]  Fax: [phone]

## 2023-06-03 NOTE — Progress Notes (Signed)
Coronary CT angiogram 06/02/2023: Coronary calcium score of 0. Normal coronary origin with right dominance. Normal coronary arteries.

## 2023-06-04 ENCOUNTER — Telehealth: Payer: Self-pay | Admitting: Family Medicine

## 2023-06-04 NOTE — Telephone Encounter (Signed)
Pt call and stated she still have the cough she also stated she want dr. [place] to call her in a cough syrup.Pt want a call back.

## 2023-06-05 ENCOUNTER — Other Ambulatory Visit: Payer: Self-pay | Admitting: Family Medicine

## 2023-06-05 DIAGNOSIS — B3731 Acute candidiasis of vulva and vagina: Secondary | ICD-10-CM

## 2023-06-05 DIAGNOSIS — R059 Cough, unspecified: Secondary | ICD-10-CM

## 2023-06-05 MED ORDER — TERCONAZOLE 80 MG VA SUPP: 80.0000 mg | Freq: Every day | VAGINAL | 0 refills | Status: AC

## 2023-06-05 MED ORDER — BENZONATATE 100 MG PO CAPS
200.0000 mg | ORAL_CAPSULE | Freq: Two times a day (BID) | ORAL | 0 refills | Status: DC | PRN
Start: 2023-06-05 — End: 2023-06-15

## 2023-06-05 NOTE — Telephone Encounter (Signed)
Duplicate request - see other phone encounter; waiting on PCP.

## 2023-06-05 NOTE — Telephone Encounter (Signed)
I sent a message through MyChart. I will send a prescription for benzonatate for cough management, which is a safer for her. Diflucan 150 mg weekly x 2 and a vaginal cream will be sent also. Thanks, BJ

## 2023-06-05 NOTE — Telephone Encounter (Signed)
Pt is requesting treatment for yeast infection from abx prescribed, a rinse for her mouth, and a cough syrup.

## 2023-06-08 ENCOUNTER — Encounter (HOSPITAL_COMMUNITY): Payer: Self-pay | Admitting: Cardiology

## 2023-06-08 ENCOUNTER — Ambulatory Visit (HOSPITAL_COMMUNITY): Payer: Medicare PPO | Attending: Cardiology

## 2023-06-08 ENCOUNTER — Encounter: Payer: Self-pay | Admitting: Family Medicine

## 2023-06-11 ENCOUNTER — Ambulatory Visit: Payer: Medicare PPO | Admitting: Family Medicine

## 2023-06-11 ENCOUNTER — Ambulatory Visit (HOSPITAL_BASED_OUTPATIENT_CLINIC_OR_DEPARTMENT_OTHER)
Admission: RE | Admit: 2023-06-11 | Discharge: 2023-06-11 | Disposition: A | Discharge status: Home / Self Care | Payer: Medicare PPO | Source: Ambulatory Visit | Attending: Family Medicine | Admitting: Family Medicine

## 2023-06-11 ENCOUNTER — Encounter: Payer: Self-pay | Admitting: Family Medicine

## 2023-06-11 VITALS — BP 124/72 | HR 88 | Temp 98.8°F | Ht 60.0 in | Wt 147.6 lb

## 2023-06-11 DIAGNOSIS — R059 Cough, unspecified: Secondary | ICD-10-CM | POA: Diagnosis not present

## 2023-06-11 DIAGNOSIS — R5383 Other fatigue: Secondary | ICD-10-CM

## 2023-06-11 DIAGNOSIS — R0989 Other specified symptoms and signs involving the circulatory and respiratory systems: Secondary | ICD-10-CM | POA: Diagnosis not present

## 2023-06-11 DIAGNOSIS — R6883 Chills (without fever): Secondary | ICD-10-CM | POA: Diagnosis not present

## 2023-06-11 DIAGNOSIS — R052 Subacute cough: Secondary | ICD-10-CM | POA: Insufficient documentation

## 2023-06-11 DIAGNOSIS — I700 Atherosclerosis of aorta: Secondary | ICD-10-CM | POA: Diagnosis not present

## 2023-06-11 MED ORDER — BENZONATATE 200 MG PO CAPS
200.0000 mg | ORAL_CAPSULE | Freq: Two times a day (BID) | ORAL | 0 refills | Status: DC | PRN
Start: 2023-06-11 — End: 2023-06-23

## 2023-06-11 NOTE — Progress Notes (Signed)
Established Patient Office Visit   Subjective  Patient ID: [name], female    DOB: 07/13/1961  Age: 62 y.o. MRN: [id number]  Chief Complaint  Patient presents with   Cough    Cough, congestion, sore throat, chills, started 3 weeks ago, patient has taken Doxycyline   Pt accompanied by her wife.  Patient is a 62 year old female followed by Dr. [place] and seen for ongoing concern.  Patient seen 10/11 for cough.  Seen in ED on 05/08/2023 for precordial chest pain.  CTA negative.  Seen by PCP 05/21/2469 course doxycycline started and trial of PPI.  Seen again 05/29/2023 for diaphoresis, rales, fall.  CXR negative and CV seen normal at that visit.  Patient notes continued cough, congestion, chills at night.  States gets it all in throat that causes coughing spell.  Taking Mucinex, Tessalon 100 mg.  Patient inquires about cough syrup with codeine.  Cough     Patient Active Problem List   Diagnosis Date Noted   Atherosclerosis of aorta (HCC) 05/22/2023   Chronic diarrhea 05/01/2023   Unstable gait 07/05/2022   Noninvasive follicular neoplasm of thyroid with papillary-like nuclear features 10/10/2021   Type II diabetes mellitus 10/10/2021   Postsurgical hypothyroidism 09/03/2021   Vitamin B12 deficiency 09/03/2021   Female stress incontinence 11/15/2020   History of hysterectomy 11/15/2020   Mass of urethra 11/15/2020   Osteopenia 11/15/2020   Chronic interstitial cystitis 11/15/2020   Mild cognitive impairment of uncertain or unknown etiology 10/16/2020   Social anxiety disorder    Elevated lactic acid level    Diverticular disease of colon 08/06/2020   Fecal urgency 08/06/2020   Gastroesophageal reflux disease 08/06/2020   Irritable bowel syndrome 08/06/2020   Left upper quadrant pain 08/06/2020   Periumbilical pain 08/06/2020   Pruritus ani 08/06/2020   Chronic fatigue 06/21/2017   Hyperlipidemia, mixed 05/12/2017   Insomnia 05/12/2017   Multinodular goiter 05/12/2017    Hoarseness of voice 02/27/2016   Hyperprolactinemia 06/26/2015   Autonomic dysfunction 01/11/2015   Bipolar 1 disorder 01/11/2015   Nondiabetic gastroparesis 01/11/2015   History of lithium toxicity 05/01/2014   Leukocytosis 05/01/2014   Costochondritis 02/14/2014   Fatty liver disease, nonalcoholic 02/13/2014   Early satiety 04/27/2013   Abnormal LFTs 04/06/2013   Dysuria 02/15/2013   Lump of breast, left 12/20/2012   Loss of weight 11/08/2012   Food aversion 11/08/2012   Constipation 05/17/2012   Jaw pain 02/13/2012   Vitamin D deficiency 09/23/2011   Double vision 03/15/2011   Hyperglycemia 01/10/2011   Dyspnea on effort 01/10/2011   Palpitations 12/08/2010   Endometriosis 05/11/2008   Plantar fasciitis 06/11/2007   Allergic rhinitis 03/26/2007   Past Medical History:  Diagnosis Date   Abnormal LFTs 04/06/2013   Very minor tried to reassure okay to repeat today with hepatitis C screen   Allergic rhinitis 03/26/2007   Autonomic dysfunction 01/11/2015   Bipolar 1 disorder    Hx of psychosis; present since age 17   Chest pain on breathing 03/16/2014   Chronic fatigue 06/21/2017   Chronic interstitial cystitis 11/15/2020   Community acquired pneumonia of right lung 08/17/2020   Complication of anesthesia    agitation   Constipation 05/17/2012   Costochondritis 02/14/2014   Diverticular disease of colon 08/06/2020   Double vision 03/15/2011   Dyspnea on effort 01/10/2011   Dysuria 02/15/2013   Early satiety 04/27/2013   Elevated lactic acid level    Endometriosis 05/11/2008   Fatty liver disease,  nonalcoholic 02/13/2014   Fecal urgency 08/06/2020   Female stress incontinence 11/15/2020   Food aversion 11/08/2012   Frequency of urination    Gastroesophageal reflux disease 08/06/2020   Headache    History of duodenal ulcer    History of hysterectomy 11/15/2020   History of kidney stones    History of lithium toxicity 05/01/2014   Hoarseness of voice  02/27/2016   Hyperglycemia 01/10/2011   Hyperlipidemia, mixed 05/12/2017   Hyperprolactinemia 06/26/2015   Insomnia 05/12/2017   Irritable bowel syndrome 08/06/2020   Jaw pain 02/13/2012   poss tmj area     Left upper quadrant pain 08/06/2020   Leukocytosis 05/01/2014   Lump of breast, left 12/20/2012   about 6 oclock    Mass of urethra 11/15/2020   Mild cognitive impairment of uncertain or unknown etiology 10/16/2020   Multinodular goiter 05/12/2017   Nocturia    Nondiabetic gastroparesis 01/11/2015   Noninvasive follicular neoplasm of thyroid with papillary-like nuclear features 10/10/2021   Palpitations 12/08/2010   Pelvic pain    Periumbilical pain 08/06/2020   Plantar fasciitis 06/11/2007   Postsurgical hypothyroidism 09/03/2021   Pruritus ani 08/06/2020   Right ear pain 02/16/2012   Social anxiety disorder    Subacute bronchitis 07/10/2018   Type II diabetes mellitus 10/10/2021   Unstable gait 07/05/2022   Urgency of urination    Vitamin B12 deficiency 09/03/2021   Vitamin D deficiency 09/23/2011   Past Surgical History:  Procedure Laterality Date   CARDIAC CATHETERIZATION  10-09-1999  DR KATZ   NORMAL LVF/  NORMAL RCA/  NO CRITICAL DISEASE LEFT CORONARY SYSTEM   CARDIOVASCULAR STRESS TEST  01-23-2011   NORMAL NUCLEAR STUDY/ NO ISCHEMIA/ EF 81%   CYSTOSCOPY WITH BIOPSY N/A 06/03/2013   Procedure: CYSTOSCOPY WITH BIOPSY  INSTILLATION OF MARCAINE AND PYRIDIUM;  Surgeon: Lindaann Slough, MD;  Location:  SURGERY CENTER;  Service: Urology;  Laterality: N/A;   LAPAROSCOPIC CHOLECYSTECTOMY  11-08-2001   LAPAROSCOPIC LEFT SALPINGOOPHORECTOMY AND LYSYS ADHESIONS  03-10-2002   LAPAROSCOPIC REMOVAL OVARY REMNANT  2003   [place]   LAPAROSCOPY N/A 12/14/2012   Procedure: LAPAROSCOPY DIAGNOSTIC;  Surgeon: Almond Lint, MD;  Location: [facility];  Service: General;  Laterality: N/A;   THYROIDECTOMY N/A 05/24/2021   Procedure: TOTAL THYROIDECTOMY;  Surgeon: Darnell Level,  MD;  Location: [facility];  Service: General;  Laterality: N/A;   TOTAL ABDOMINAL HYSTERECTOMY  2000   W/ RIGHT SALPINGOOPHORECTOMY   TRANSTHORACIC ECHOCARDIOGRAM  10-13-2010   NORMAL LVF/  EF 55-60%   Social History   Tobacco Use   Smoking status: Former    Current packs/day: 0.00    Types: Cigarettes    Start date: 1979    Quit date: 1980    Years since quitting: 44.8   Smokeless tobacco: Never   Tobacco comments:    ONLY SMOKED FOR 6 MONTHS --  QUIT  YRS AGO  Vaping Use   Vaping status: Never Used  Substance Use Topics   Alcohol use: Not Currently    Comment: occ   Drug use: Never   Family History  Problem Relation Age of Onset   Other Mother        MAC infection   Anxiety disorder Mother    Dementia Mother        likely Alzheimer's disease; symptom onset on late 70s/early 80s   Sleep apnea Father    Depression Father    Alcohol abuse Father    Hypertension Father  Migraines Sister        headaches   Anxiety disorder Sister    Depression Sister    Heart disease Paternal Grandmother    Hyperlipidemia Paternal Grandmother    Alcohol abuse Paternal Grandfather    Diabetes Neg Hx    Allergies  Allergen Reactions   Morphine And Codeine Nausea And Vomiting and Nausea Only   Ambien [Zolpidem] Other (See Comments)    Per patient this caused her to fall   Duloxetine Swelling    Edema   Latuda [Lurasidone Hcl]    Morphine Nausea Only   Shingrix [Zoster Vac Recomb Adjuvanted]     Dizziness vomiting      Review of Systems  Respiratory:  Positive for cough.    Negative unless stated above    Objective:     BP 124/72 (BP Location: Left Arm, Patient Position: Sitting, Cuff Size: Normal)   Pulse 88   Temp 98.8 F (37.1 C) (Oral)   Ht 5' (1.524 m)   Wt 147 lb 9.6 oz (67 kg)   SpO2 96%   BMI 28.83 kg/m  BP Readings from Last 3 Encounters:  06/11/23 124/72  06/01/23 (!) 100/58  05/29/23 120/80   Wt Readings from Last 3 Encounters:  06/11/23 147 lb 9.6  oz (67 kg)  05/29/23 144 lb 4 oz (65.4 kg)  05/22/23 147 lb 6 oz (66.8 kg)      Physical Exam Constitutional:      General: She is not in acute distress.    Appearance: Normal appearance. She is ill-appearing. She is not toxic-appearing or diaphoretic.  HENT:     Head: Normocephalic and atraumatic.     Nose: Nose normal.     Mouth/Throat:     Mouth: Mucous membranes are moist.  Cardiovascular:     Rate and Rhythm: Normal rate and regular rhythm.     Heart sounds: Normal heart sounds. No murmur heard.    No gallop.  Pulmonary:     Effort: Pulmonary effort is normal. No respiratory distress.     Breath sounds: Examination of the left-middle field reveals decreased breath sounds. Examination of the left-lower field reveals decreased breath sounds. Decreased breath sounds and rhonchi present. No wheezing or rales.  Skin:    General: Skin is warm and dry.  Neurological:     Mental Status: She is alert and oriented to person, place, and time.    No results found for any visits on 06/11/23.    Assessment & Plan:  Subacute cough -     DG Chest 2 View; Future -     Benzonatate; Take 1 capsule (200 mg total) by mouth 2 (two) times daily as needed for cough.  Dispense: 20 capsule; Refill: 0  Fatigue, unspecified type  Ongoing cough x 3 weeks.  Continued symptoms s/p course of doxycycline.  RSV testing negative in clinic.  Obtain CXR at [facility] as x-ray tech unavailable in clinic.  Continue supportive care with OTC cough/cold medications.  Rx for Tessalon 200 mg sent to pharmacy.  Given patient's h/o fall risk and allergy to codeine/morphine advised Rx for cough syrup with codeine and appropriate.  Further recommendations based on imaging.  No follow-ups on file.   Deeann Saint, MD

## 2023-06-23 ENCOUNTER — Telehealth: Payer: Self-pay | Admitting: Family Medicine

## 2023-06-23 DIAGNOSIS — R052 Subacute cough: Secondary | ICD-10-CM

## 2023-06-23 NOTE — Telephone Encounter (Signed)
Prescription Request  06/23/2023  LOV: 05/29/2023  What is the name of the medication or equipment? benzonatate (TESSALON) 200 MG capsule. Pt saw dr banks on 06-11-2023   Have you contacted your pharmacy to request a refill? No   Which pharmacy would you like this sent to?  [facility] [address] [place] - [address] [place] [address] Phone: [phone] Fax: [phone]    Patient notified that their request is being sent to the clinical staff for review and that they should receive a response within 2 business days.   Please advise at Mobile [phone] (mobile)

## 2023-06-24 MED ORDER — BENZONATATE 200 MG PO CAPS
200.0000 mg | ORAL_CAPSULE | Freq: Two times a day (BID) | ORAL | 0 refills | Status: DC | PRN
Start: 2023-06-24 — End: 2023-09-24

## 2023-06-25 ENCOUNTER — Ambulatory Visit (HOSPITAL_COMMUNITY)
Admission: EM | Admit: 2023-06-25 | Discharge: 2023-06-25 | Disposition: A | Discharge status: Other Acute Inpatient Hospital | Payer: Medicare PPO | Attending: Psychiatry | Admitting: Psychiatry

## 2023-06-25 ENCOUNTER — Inpatient Hospital Stay
Admission: AD | Admit: 2023-06-25 | Discharge: 2023-07-02 | DRG: 885 | Disposition: A | Discharge status: Home / Self Care | Payer: Medicare PPO | Source: Intra-hospital | Attending: Psychiatry | Admitting: Psychiatry

## 2023-06-25 ENCOUNTER — Encounter: Payer: Self-pay | Admitting: Psychiatry

## 2023-06-25 ENCOUNTER — Other Ambulatory Visit: Payer: Self-pay

## 2023-06-25 DIAGNOSIS — Z888 Allergy status to other drugs, medicaments and biological substances status: Secondary | ICD-10-CM | POA: Diagnosis not present

## 2023-06-25 DIAGNOSIS — Z885 Allergy status to narcotic agent status: Secondary | ICD-10-CM | POA: Diagnosis not present

## 2023-06-25 DIAGNOSIS — F319 Bipolar disorder, unspecified: Secondary | ICD-10-CM | POA: Diagnosis present

## 2023-06-25 DIAGNOSIS — Z87891 Personal history of nicotine dependence: Secondary | ICD-10-CM

## 2023-06-25 DIAGNOSIS — F69 Unspecified disorder of adult personality and behavior: Secondary | ICD-10-CM

## 2023-06-25 DIAGNOSIS — Z7982 Long term (current) use of aspirin: Secondary | ICD-10-CM | POA: Diagnosis not present

## 2023-06-25 DIAGNOSIS — F259 Schizoaffective disorder, unspecified: Secondary | ICD-10-CM | POA: Diagnosis not present

## 2023-06-25 DIAGNOSIS — Z79899 Other long term (current) drug therapy: Secondary | ICD-10-CM

## 2023-06-25 DIAGNOSIS — G4700 Insomnia, unspecified: Secondary | ICD-10-CM | POA: Diagnosis present

## 2023-06-25 DIAGNOSIS — Z811 Family history of alcohol abuse and dependence: Secondary | ICD-10-CM

## 2023-06-25 DIAGNOSIS — F250 Schizoaffective disorder, bipolar type: Secondary | ICD-10-CM | POA: Diagnosis not present

## 2023-06-25 DIAGNOSIS — Z9152 Personal history of nonsuicidal self-harm: Secondary | ICD-10-CM | POA: Diagnosis not present

## 2023-06-25 DIAGNOSIS — Z9049 Acquired absence of other specified parts of digestive tract: Secondary | ICD-10-CM | POA: Diagnosis not present

## 2023-06-25 DIAGNOSIS — Z79890 Hormone replacement therapy (postmenopausal): Secondary | ICD-10-CM

## 2023-06-25 DIAGNOSIS — R45851 Suicidal ideations: Secondary | ICD-10-CM | POA: Diagnosis not present

## 2023-06-25 DIAGNOSIS — Z818 Family history of other mental and behavioral disorders: Secondary | ICD-10-CM

## 2023-06-25 DIAGNOSIS — E782 Mixed hyperlipidemia: Secondary | ICD-10-CM | POA: Diagnosis present

## 2023-06-25 DIAGNOSIS — Z90710 Acquired absence of both cervix and uterus: Secondary | ICD-10-CM

## 2023-06-25 DIAGNOSIS — Z1152 Encounter for screening for COVID-19: Secondary | ICD-10-CM | POA: Insufficient documentation

## 2023-06-25 DIAGNOSIS — I700 Atherosclerosis of aorta: Secondary | ICD-10-CM

## 2023-06-25 DIAGNOSIS — Z8711 Personal history of peptic ulcer disease: Secondary | ICD-10-CM | POA: Diagnosis not present

## 2023-06-25 DIAGNOSIS — E890 Postprocedural hypothyroidism: Secondary | ICD-10-CM | POA: Diagnosis present

## 2023-06-25 DIAGNOSIS — F3130 Bipolar disorder, current episode depressed, mild or moderate severity, unspecified: Secondary | ICD-10-CM | POA: Diagnosis not present

## 2023-06-25 DIAGNOSIS — Z87442 Personal history of urinary calculi: Secondary | ICD-10-CM

## 2023-06-25 DIAGNOSIS — G4709 Other insomnia: Secondary | ICD-10-CM | POA: Insufficient documentation

## 2023-06-25 DIAGNOSIS — Z820 Family history of epilepsy and other diseases of the nervous system: Secondary | ICD-10-CM | POA: Diagnosis not present

## 2023-06-25 DIAGNOSIS — Z83438 Family history of other disorder of lipoprotein metabolism and other lipidemia: Secondary | ICD-10-CM

## 2023-06-25 DIAGNOSIS — Z8249 Family history of ischemic heart disease and other diseases of the circulatory system: Secondary | ICD-10-CM

## 2023-06-25 DIAGNOSIS — Z90721 Acquired absence of ovaries, unilateral: Secondary | ICD-10-CM

## 2023-06-25 DIAGNOSIS — F251 Schizoaffective disorder, depressive type: Secondary | ICD-10-CM | POA: Diagnosis not present

## 2023-06-25 DIAGNOSIS — Z9079 Acquired absence of other genital organ(s): Secondary | ICD-10-CM

## 2023-06-25 LAB — CBC WITH DIFFERENTIAL/PLATELET
Abs Immature Granulocytes: 0.03 10*3/uL (ref 0.00–0.07)
Basophils Absolute: 0.1 10*3/uL (ref 0.0–0.1)
Basophils Relative: 1 %
Eosinophils Absolute: 0.3 10*3/uL (ref 0.0–0.5)
Eosinophils Relative: 4 %
HCT: 43.6 % (ref 36.0–46.0)
Hemoglobin: 14 g/dL (ref 12.0–15.0)
Immature Granulocytes: 0 %
Lymphocytes Relative: 25 %
Lymphs Abs: 2.2 10*3/uL (ref 0.7–4.0)
MCH: 31.1 pg (ref 26.0–34.0)
MCHC: 32.1 g/dL (ref 30.0–36.0)
MCV: 96.9 fL (ref 80.0–100.0)
Monocytes Absolute: 0.8 10*3/uL (ref 0.1–1.0)
Monocytes Relative: 9 %
Neutro Abs: 5.3 10*3/uL (ref 1.7–7.7)
Neutrophils Relative %: 61 %
Platelets: 284 10*3/uL (ref 150–400)
RBC: 4.5 MIL/uL (ref 3.87–5.11)
RDW: 12.6 % (ref 11.5–15.5)
WBC: 8.6 10*3/uL (ref 4.0–10.5)
nRBC: 0 % (ref 0.0–0.2)

## 2023-06-25 LAB — COMPREHENSIVE METABOLIC PANEL
ALT: 28 U/L (ref 0–44)
AST: 26 U/L (ref 15–41)
Albumin: 3.6 g/dL (ref 3.5–5.0)
Alkaline Phosphatase: 68 U/L (ref 38–126)
Anion gap: 14 (ref 5–15)
BUN: 15 mg/dL (ref 8–23)
CO2: 24 mmol/L (ref 22–32)
Calcium: 9.8 mg/dL (ref 8.9–10.3)
Chloride: 99 mmol/L (ref 98–111)
Creatinine, Ser: 0.71 mg/dL (ref 0.44–1.00)
GFR, Estimated: >60 mL/min (ref >60)
Glucose, Bld: 125 mg/dL — ABNORMAL HIGH (ref 70–99)
Potassium: 4.1 mmol/L (ref 3.5–5.1)
Sodium: 137 mmol/L (ref 135–145)
Total Bilirubin: 0.8 mg/dL (ref <1.2)
Total Protein: 8.1 g/dL (ref 6.5–8.1)

## 2023-06-25 LAB — TSH: TSH: 6.382 u[IU]/mL — ABNORMAL HIGH (ref 0.350–4.500)

## 2023-06-25 LAB — POCT URINE DRUG SCREEN - MANUAL ENTRY (I-SCREEN)
POC Amphetamine UR: NOT DETECTED
POC Buprenorphine (BUP): NOT DETECTED
POC Cocaine UR: NOT DETECTED
POC Marijuana UR: NOT DETECTED
POC Methadone UR: NOT DETECTED
POC Methamphetamine UR: NOT DETECTED
POC Morphine: NOT DETECTED
POC Oxazepam (BZO): POSITIVE — AB
POC Oxycodone UR: NOT DETECTED
POC Secobarbital (BAR): NOT DETECTED

## 2023-06-25 LAB — ETHANOL: Alcohol, Ethyl (B): <10 mg/dL (ref <10)

## 2023-06-25 LAB — POC URINE PREG, ED: Preg Test, Ur: NEGATIVE

## 2023-06-25 LAB — SARS CORONAVIRUS 2 BY RT PCR: SARS Coronavirus 2 by RT PCR: NEGATIVE

## 2023-06-25 MED ORDER — MAGNESIUM HYDROXIDE 400 MG/5ML PO SUSP: 30.0000 mL | Freq: Every day | ORAL | Status: DC | PRN

## 2023-06-25 MED ORDER — LEVOTHYROXINE SODIUM 100 MCG PO TABS
100.0000 ug | ORAL_TABLET | Freq: Every day | ORAL | Status: DC
  Administered 2023-06-26 – 2023-07-02 (×7): 100 ug via ORAL
  Filled 2023-06-25 (×7): qty 1

## 2023-06-25 MED ORDER — DIPHENHYDRAMINE HCL 50 MG/ML IJ SOLN: 50.0000 mg | Freq: Three times a day (TID) | INTRAMUSCULAR | Status: DC | PRN

## 2023-06-25 MED ORDER — HALOPERIDOL LACTATE 5 MG/ML IJ SOLN: 5.0000 mg | Freq: Three times a day (TID) | INTRAMUSCULAR | Status: DC | PRN

## 2023-06-25 MED ORDER — LORAZEPAM 1 MG PO TABS: 2.0000 mg | ORAL_TABLET | Freq: Three times a day (TID) | ORAL | Status: DC | PRN

## 2023-06-25 MED ORDER — ALUM & MAG HYDROXIDE-SIMETH 200-200-20 MG/5ML PO SUSP
30.0000 mL | ORAL | Status: DC | PRN
  Filled 2023-06-25: qty 30

## 2023-06-25 MED ORDER — BENZONATATE 100 MG PO CAPS
200.0000 mg | ORAL_CAPSULE | Freq: Two times a day (BID) | ORAL | Status: DC | PRN
  Administered 2023-06-25 – 2023-06-30 (×11): 200 mg via ORAL
  Filled 2023-06-25 (×13): qty 2

## 2023-06-25 MED ORDER — GUAIFENESIN ER 600 MG PO TB12
600.0000 mg | ORAL_TABLET | Freq: Two times a day (BID) | ORAL | Status: AC
  Administered 2023-06-25 – 2023-07-02 (×14): 600 mg via ORAL
  Filled 2023-06-25 (×14): qty 1

## 2023-06-25 MED ORDER — HALOPERIDOL 5 MG PO TABS: 5.0000 mg | ORAL_TABLET | Freq: Three times a day (TID) | ORAL | Status: DC | PRN

## 2023-06-25 MED ORDER — OLANZAPINE 10 MG PO TBDP: 10.0000 mg | ORAL_TABLET | Freq: Three times a day (TID) | ORAL | Status: DC | PRN

## 2023-06-25 MED ORDER — ALUM & MAG HYDROXIDE-SIMETH 200-200-20 MG/5ML PO SUSP: 30.0000 mL | ORAL | Status: DC | PRN

## 2023-06-25 MED ORDER — ACETAMINOPHEN 325 MG PO TABS: 650.0000 mg | ORAL_TABLET | Freq: Four times a day (QID) | ORAL | Status: DC | PRN

## 2023-06-25 MED ORDER — DIVALPROEX SODIUM ER 500 MG PO TB24
750.0000 mg | ORAL_TABLET | Freq: Every day | ORAL | Status: DC
  Administered 2023-06-25 – 2023-07-01 (×7): 750 mg via ORAL
  Filled 2023-06-25 (×7): qty 1

## 2023-06-25 MED ORDER — LORAZEPAM 2 MG/ML IJ SOLN: 2.0000 mg | Freq: Three times a day (TID) | INTRAMUSCULAR | Status: DC | PRN

## 2023-06-25 MED ORDER — DIVALPROEX SODIUM ER 250 MG PO TB24: 750.0000 mg | ORAL_TABLET | Freq: Every day | ORAL | Status: DC

## 2023-06-25 MED ORDER — DIPHENHYDRAMINE HCL 25 MG PO CAPS: 50.0000 mg | ORAL_CAPSULE | Freq: Three times a day (TID) | ORAL | Status: DC | PRN

## 2023-06-25 MED ORDER — TRAZODONE HCL 50 MG PO TABS
50.0000 mg | ORAL_TABLET | Freq: Every evening | ORAL | Status: DC | PRN
  Administered 2023-06-25 – 2023-06-30 (×4): 50 mg via ORAL
  Filled 2023-06-25 (×6): qty 1

## 2023-06-25 MED ORDER — LORAZEPAM 1 MG PO TABS: 1.0000 mg | ORAL_TABLET | ORAL | Status: DC | PRN

## 2023-06-25 MED ORDER — PRAVASTATIN SODIUM 10 MG PO TABS
20.0000 mg | ORAL_TABLET | Freq: Every day | ORAL | Status: DC
  Administered 2023-06-25: 20 mg via ORAL
  Filled 2023-06-25: qty 2

## 2023-06-25 MED ORDER — LEVOTHYROXINE SODIUM 100 MCG PO TABS
100.0000 ug | ORAL_TABLET | Freq: Every day | ORAL | Status: DC
  Administered 2023-06-25: 100 ug via ORAL
  Filled 2023-06-25: qty 1

## 2023-06-25 MED ORDER — ZIPRASIDONE MESYLATE 20 MG IM SOLR: 20.0000 mg | INTRAMUSCULAR | Status: DC | PRN

## 2023-06-25 MED ORDER — ALBUTEROL SULFATE HFA 108 (90 BASE) MCG/ACT IN AERS: 2.0000 | INHALATION_SPRAY | Freq: Four times a day (QID) | RESPIRATORY_TRACT | Status: DC | PRN

## 2023-06-25 MED ORDER — PRAVASTATIN SODIUM 20 MG PO TABS
20.0000 mg | ORAL_TABLET | Freq: Every day | ORAL | Status: DC
  Administered 2023-06-26 – 2023-07-01 (×6): 20 mg via ORAL
  Filled 2023-06-25 (×6): qty 1

## 2023-06-25 MED ORDER — HYDROXYZINE HCL 25 MG PO TABS
25.0000 mg | ORAL_TABLET | Freq: Three times a day (TID) | ORAL | Status: DC | PRN
  Administered 2023-06-25 – 2023-06-27 (×2): 25 mg via ORAL
  Filled 2023-06-25 (×3): qty 1

## 2023-06-25 NOTE — Group Note (Signed)
Recreation Therapy Group Note   Group Topic:Relaxation  Group Date: 06/25/2023 Start Time:  2:00 PM End Time:  2:45 PM Facilitators: Rosina Lowenstein, LRT, CTRS Location:  Dayroom   Group Description: PMR (Progressive Muscle Relaxation). LRT asks patients their current level of stress/anxiety from 1-10, with 10 being the highest. LRT educates patients on what PMR is and the benefits that come from it. Patients are asked to sit with their feet flat on the floor while sitting up and all the way back in their chair, if possible. LRT and pts follow a prompt through a speaker that requires you to tense and release different muscles in their body and focus on their breathing. During session, lights are off and soft music is being played. Pts are given a stress ball to use if needed. At the end of the prompt, LRT asks patients to rank their current levels of stress/anxiety from 1-10, 10 being the highest.  Goal Area(s) Addressed:  Patients will be able to describe progressive muscle relaxation.  Patient will practice using relaxation technique. Patient will identify a new coping skill.  Patient will follow multistep directions to reduce anxiety and stress.  Affect/Mood: N/A   Participation Level: Did not attend    Clinical Observations/Individualized Feedback: [name] did not attend group due to not being on the unit yet.   Plan: Continue to engage patient in RT group sessions 2-3x/week.   Rosina Lowenstein, LRT, CTRS 06/25/2023 3:30 PM

## 2023-06-25 NOTE — ED Provider Notes (Signed)
FBC/OBS ASAP Discharge Summary  Date and Time: 06/25/2023 12:25 PM  Name: [name]  MRN:  [id number]   Discharge Diagnoses:  Final diagnoses:  Schizoaffective disorder, bipolar type [facility])  Behavior concern in adult  Other insomnia   Subjective:  Pt chart reviewed and assessed face to face. Pt seen earlier this morning and recommended for observation. On my assessment with pt, pt reports her mood is "ok". She states she is not sure why she is here and does not think she is going through a psychiatric crisis. She denies suicidal, homicidal ideations. She denies auditory visual hallucinations. When asked about paranoia, reports she was scared at home because she believes people are out to kill her because "I have a lot of knowledge". When asked what knowledge she has, she states she has been "documenting" things going on in her family as well as with the election. She reports she slept well while at this facility, although appears she was just admitted this morning. Pt states she lives with her wife, [name], and her son, [name]. She is independent of ADLs. States she does have a walker and cane at home but she does not use it every day. Pt states she has been diagnosed with schizoaffective disorder, bipolar type, for which she takes depakote. Reports she used to take lithium although overdosed on it during a psychotic episode. When asked whether the overdose was a suicidal attempt, states she is not sure.   Spoke w/ pt's wife, [name], [phone]. She states that pt has been worsening over the course of the past week. Pt locked herself in the bathroom this morning and was texting on her phone, believing she was texting with [name]. Patient texted her name, date of birth, and social security number to this person. Also told this person not to give up on the election. [name] states pt also gave at least $1000 to the [employer], when this is not something they can  afford. Pt has not been sleeping well either. [name] states pt has a walker and cane at home but pt can walk without them. Pt completes ADLs independently. Pt's current medications wer   Pt placed under IVC and recommended for inpatient psychiatric admission.      Stay Summary:  62 y/o female w/ reported history of schizoaffective disorder, bipolar type presenting with paranoia, delusions. Per collateral, has been worsening for the past week. Pt placed under IVC and recommended for inpatient psychiatric admission. Has been accepted to geropsychiatry unit for crisis stabilization.  Total Time spent with patient: 45 minutes  Past Psychiatric History:  Reported history of schizoaffective disorder, bipolar type; per chart review, history of mild cognitive impairment  Past Medical History:  Per chart review, hx of diabetes mellitus type II, gerd, IBS, fatty liver disease, endometriosis, plantar fascitis Family History: None reported Family Psychiatric History: None reported Social History: Living with wife and son Tobacco Cessation:  N/A, patient does not currently use tobacco products and Prescription not provided because: inpatient psychiatric hospitalization  Current Medications:  Current Facility-Administered Medications  Medication Dose Route Frequency Provider Last Rate Last Admin   acetaminophen (TYLENOL) tablet 650 mg  650 mg Oral Q6H PRN Sindy Guadeloupe, NP       alum & mag hydroxide-simeth (MAALOX/MYLANTA) 200-200-20 MG/5ML suspension 30 mL  30 mL Oral Q4H PRN Sindy Guadeloupe, NP       divalproex (DEPAKOTE ER) 24 hr tablet 750 mg  750 mg Oral QHS Darrick Grinder  Esmond Harps, NP       levothyroxine (SYNTHROID) tablet 100 mcg  100 mcg Oral Daily Lauree Chandler, NP   100 mcg at 06/25/23 1017   OLANZapine zydis (ZYPREXA) disintegrating tablet 10 mg  10 mg Oral Q8H PRN Sindy Guadeloupe, NP       And   LORazepam (ATIVAN) tablet 1 mg  1 mg Oral PRN Sindy Guadeloupe, NP       And   ziprasidone (GEODON)  injection 20 mg  20 mg Intramuscular PRN Sindy Guadeloupe, NP       magnesium hydroxide (MILK OF MAGNESIA) suspension 30 mL  30 mL Oral Daily PRN Sindy Guadeloupe, NP       pravastatin (PRAVACHOL) tablet 20 mg  20 mg Oral Daily Lauree Chandler, NP   20 mg at 06/25/23 1017   Current Outpatient Medications  Medication Sig Dispense Refill   albuterol (VENTOLIN HFA) 108 (90 Base) MCG/ACT inhaler Inhale 2 puffs into the lungs every 6 (six) hours as needed for wheezing or shortness of breath. 18 g 1   aspirin (ASPIRIN CHILDRENS) 81 MG chewable tablet Chew 1 tablet (81 mg total) by mouth daily.     aspirin-acetaminophen-caffeine (EXCEDRIN MIGRAINE) 250-250-65 MG tablet Take 2 tablets by mouth every 6 (six) hours as needed for headache.     AUVELITY 45-105 MG TBCR Take 1 tablet by mouth every morning.     benzonatate (TESSALON) 200 MG capsule Take 1 capsule (200 mg total) by mouth 2 (two) times daily as needed for cough. 20 capsule 0   divalproex (DEPAKOTE ER) 250 MG 24 hr tablet Take 750 mg by mouth at bedtime.     levothyroxine (SYNTHROID) 100 MCG tablet Take 1 tablet (100 mcg total) by mouth daily. 90 tablet 3   meclizine (ANTIVERT) 25 MG tablet Take 1 tablet (25 mg total) by mouth 3 (three) times daily as needed for dizziness. 30 tablet 0   metoprolol tartrate (LOPRESSOR) 25 MG tablet Take 1 tablet (25 mg total) by mouth as directed for 3 doses. Take 1 tablet twice daily 1 day before CT scan and 1 tablet before CT scan 3 tablet 0   Multiple Vitamin (MULTIVITAMIN WITH MINERALS) TABS tablet Take 1 tablet by mouth daily.     omeprazole (PRILOSEC) 40 MG capsule Take 1 capsule (40 mg total) by mouth daily. 60 capsule 0   pravastatin (PRAVACHOL) 20 MG tablet Take 1 tablet by mouth once daily 90 tablet 1   terbinafine (LAMISIL) 250 MG tablet Take 1 tablet (250 mg total) by mouth daily. 90 tablet 0   ondansetron (ZOFRAN) 4 MG tablet Take 1 tablet (4 mg total) by mouth every 8 (eight) hours as needed for nausea  or vomiting. (Patient not taking: Reported on 06/25/2023) 20 tablet 0    PTA Medications:  Facility Ordered Medications  Medication   acetaminophen (TYLENOL) tablet 650 mg   alum & mag hydroxide-simeth (MAALOX/MYLANTA) 200-200-20 MG/5ML suspension 30 mL   magnesium hydroxide (MILK OF MAGNESIA) suspension 30 mL   OLANZapine zydis (ZYPREXA) disintegrating tablet 10 mg   And   LORazepam (ATIVAN) tablet 1 mg   And   ziprasidone (GEODON) injection 20 mg   divalproex (DEPAKOTE ER) 24 hr tablet 750 mg   levothyroxine (SYNTHROID) tablet 100 mcg   pravastatin (PRAVACHOL) tablet 20 mg   PTA Medications  Medication Sig   albuterol (VENTOLIN HFA) 108 (90 Base) MCG/ACT inhaler Inhale 2 puffs into the lungs every 6 (six) hours as needed  for wheezing or shortness of breath.   divalproex (DEPAKOTE ER) 250 MG 24 hr tablet Take 750 mg by mouth at bedtime.   Multiple Vitamin (MULTIVITAMIN WITH MINERALS) TABS tablet Take 1 tablet by mouth daily.   aspirin-acetaminophen-caffeine (EXCEDRIN MIGRAINE) 250-250-65 MG tablet Take 2 tablets by mouth every 6 (six) hours as needed for headache.   meclizine (ANTIVERT) 25 MG tablet Take 1 tablet (25 mg total) by mouth 3 (three) times daily as needed for dizziness.   terbinafine (LAMISIL) 250 MG tablet Take 1 tablet (250 mg total) by mouth daily.   AUVELITY 45-105 MG TBCR Take 1 tablet by mouth every morning.   pravastatin (PRAVACHOL) 20 MG tablet Take 1 tablet by mouth once daily   levothyroxine (SYNTHROID) 100 MCG tablet Take 1 tablet (100 mcg total) by mouth daily.   aspirin (ASPIRIN CHILDRENS) 81 MG chewable tablet Chew 1 tablet (81 mg total) by mouth daily.   metoprolol tartrate (LOPRESSOR) 25 MG tablet Take 1 tablet (25 mg total) by mouth as directed for 3 doses. Take 1 tablet twice daily 1 day before CT scan and 1 tablet before CT scan   omeprazole (PRILOSEC) 40 MG capsule Take 1 capsule (40 mg total) by mouth daily.   benzonatate (TESSALON) 200 MG capsule Take  1 capsule (200 mg total) by mouth 2 (two) times daily as needed for cough.   ondansetron (ZOFRAN) 4 MG tablet Take 1 tablet (4 mg total) by mouth every 8 (eight) hours as needed for nausea or vomiting. (Patient not taking: Reported on 06/25/2023)       06/11/2023    1:13 PM 01/14/2023    3:16 PM 01/14/2023    3:14 PM  Depression screen PHQ 2/9  Decreased Interest 1 1 0  Down, Depressed, Hopeless 1 1 0  PHQ - 2 Score 2 2 0  Altered sleeping 0 0   Tired, decreased energy 0 0   Change in appetite 0 0   Feeling bad or failure about yourself  0 0   Trouble concentrating 0 0   Moving slowly or fidgety/restless 0 0   Suicidal thoughts 0 0   PHQ-9 Score 2 2   Difficult doing work/chores Not difficult at all Not difficult at all     [facility] ED from 06/25/2023 in [facility] from 05/18/2023 in [facility] Emergency Department at [facility] ED from 05/14/2023 in [facility] Emergency Department at [facility]  C-SSRS RISK CATEGORY No Risk No Risk No Risk       Musculoskeletal  Strength & Muscle Tone:  pt sitting on assessment Gait & Station:  pt sitting on assessment Patient leans:  pt sitting on assessment  Psychiatric Specialty Exam  Presentation  General Appearance:  Casual  Eye Contact: Good  Speech: Clear and Coherent; Slow  Speech Volume: Normal  Handedness: Right   Mood and Affect  Mood: -- ("ok")  Affect: Constricted; Flat   Thought Process  Thought Processes: Coherent; Goal Directed; Linear  Descriptions of Associations:Intact  Orientation:Full (Time, Place and Person)  Thought Content:Delusions; Paranoid Ideation  Diagnosis of Schizophrenia or Schizoaffective disorder in past: Yes  Duration of Psychotic Symptoms: Greater than six months   Hallucinations:Hallucinations: None  Ideas of Reference:Delusions; Paranoia  Suicidal Thoughts:Suicidal Thoughts: No  Homicidal Thoughts:Homicidal  Thoughts: No   Sensorium  Memory: Immediate Fair  Judgment: Poor  Insight: Poor   Executive Functions  Concentration: Fair  Attention Span: Fair  Recall: Fair  Fund of Knowledge: Fair  Language: Fair   Psychomotor Activity  Psychomotor Activity: Psychomotor Activity: Decreased   Assets  Assets: Manufacturing systems engineer; Financial Resources/Insurance; Housing; Resilience; Social Support   Sleep  Sleep: Sleep: Poor Number of Hours of Sleep: 4   Nutritional Assessment (For OBS and FBC admissions only) Has the patient had a weight loss or gain of 10 pounds or more in the last 3 months?: No Has the patient had a decrease in food intake/or appetite?: No Does the patient have dental problems?: No Does the patient have eating habits or behaviors that may be indicators of an eating disorder including binging or inducing vomiting?: No Has the patient recently lost weight without trying?: 0 Has the patient been eating poorly because of a decreased appetite?: 0 Malnutrition Screening Tool Score: 0    Physical Exam  Physical Exam Constitutional:      General: She is not in acute distress.    Appearance: She is not ill-appearing, toxic-appearing or diaphoretic.  Eyes:     General: No scleral icterus. Cardiovascular:     Rate and Rhythm: Normal rate.  Pulmonary:     Effort: Pulmonary effort is normal. No respiratory distress.  Neurological:     Mental Status: She is alert and oriented to person, place, and time.  Psychiatric:        Attention and Perception: Attention and perception normal.        Mood and Affect: Mood normal. Affect is flat.        Speech: Speech is delayed.        Behavior: Behavior is slowed. Behavior is cooperative.        Thought Content: Thought content is paranoid and delusional. Thought content does not include homicidal or suicidal ideation.    Review of Systems  Constitutional:  Negative for chills and fever.  Respiratory:   Negative for shortness of breath.   Cardiovascular:  Negative for chest pain and palpitations.  Gastrointestinal:  Negative for abdominal pain.  Neurological:  Negative for headaches.  Psychiatric/Behavioral:  The patient has insomnia.   +paranoia + delusions  Blood pressure 132/68, pulse 82, temperature 97.9 F (36.6 C), temperature source Oral, resp. rate 20, SpO2 98%. There is no height or weight on file to calculate BMI.  Demographic Factors:  Caucasian  Loss Factors: NA  Historical Factors: NA  Risk Reduction Factors:   Sense of responsibility to family, Living with another person, especially a relative, and Positive social support  Continued Clinical Symptoms:  Previous Psychiatric Diagnoses and Treatments  Cognitive Features That Contribute To Risk:  Paranoia, delusions    Suicide Risk:  Minimal: No identifiable suicidal ideation.  Patients presenting with no risk factors but with morbid ruminations; may be classified as minimal risk based on the severity of the depressive symptoms  Plan Of Care/Follow-up recommendations:  Transfer to Geropsychiatry unit for inpatient psychiatric admission  Disposition:  Transfer to Geropsychiatry unit for inpatient psychiatric admission  Lauree Chandler, NP 06/25/2023, 12:25 PM

## 2023-06-25 NOTE — ED Provider Notes (Signed)
[facility] Continuous Assessment Admission H&P  Date: 06/25/23 Patient Name: [name] MRN: [id number] Chief Complaint: insomnia,  and excessive spending  Diagnoses:  Final diagnoses:  Schizoaffective disorder, bipolar type [facility])  Behavior concern in adult  Other insomnia    HPI: [name], 62 y/o female with a history of bipolar disorder, schizoaffective disorder presented to [facility] accompanied by her partner [name] [phone]).  Per the patient's partner patient has been acting a little bit concerning for about 2 weeks now.  Patient is obsessed with the presidential election and thinking that [facility] the presidential candidate has called her and reached out to her multiple times.  According to the patient's partner the patient sent over thousand dollars to [employer].  Patient would stay up all night and not sleep texting on her phone and sending messages to [facility] to come get her.   Patient currently sees a psychiatrist at [facility] and also sees a therapy at the same office.  Patient lives at home with her partner currently retired.  According to patient's partner patient keep texting random numbers and spending money that they do not have.  According to patient partner patient is prescribed Ativan and she has given patient a couple of doses to see if it will to help her fall asleep or calm down but she got no result from the medication.  Per patient partner patient would wake up late in the night get on the phone texting random numbers saying she is communicating with the police and or [name]. According to patient partner they have not yet reached out to the patient's psychiatrist.  Patient's partner denied that patient is a threat to herself, denies access to guns or weapons.  Copied from triage notes: [name]- presents to [facility] voluntarily, accompanied by her wife with complaint of lack of sleep and delusional behaviors. Pt reports that she has  not been sleeping due to watching the recent election coverage. Per wife, pt locked herself in the bathroom today and believed that she was texting [name]. She reports that pt became fixated with election results. Per wife, pt has donated at least $1000 toward the [employer] over the course of election. Pt wife has had to confiscate her cell phone to minimize usage and for pt to rest. Pt believes that election is not over and that she has [name] as a contact in her phone. Pt has history of Bipolar, Schizoaffective Disorder and mild cognitive impairment. Per wife, pt had an episode in 2015 that began with pt not sleeping and resulted in numerous EMS calls and testings because pt believes that she is not well. Pt currently denies SI,HI,AVH and substance/alcohol use.  Face-to-face evaluation of patient, patient is alert and oriented to person place, does appear to be confused at times and does not have delusional thinking that she communicates with President show candidate [name], and that [name] is coming to get her.  Patient also believed that the presidential campaign has not fully over.  Patient patient denies SI, HI, AVH or paranoia.  Denies access to guns denies smoking denies illicit drug use or drinking.  Patient does seem to be obsessed with presidential candidate [name] and I do believe that they are in constant communication with each other. Pt does exhibit delusional thinking, obsession,  impulsive spending, with insomnia. Discuss with pt and partner the need for further evaluation,  both in agreement.     Recommend inpatient observation  Total Time spent with patient: 20 minutes  Musculoskeletal  Strength & Muscle Tone: within normal limits Gait & Station: normal Patient leans: N/A  Psychiatric Specialty Exam  Presentation General Appearance:  Casual  Eye Contact: Good  Speech: Clear and Coherent  Speech Volume:No data  recorded Handedness: Right   Mood and Affect  Mood: Labile  Affect: Flat   Thought Process  Thought Processes: Linear  Descriptions of Associations:Loose  Orientation:Full (Time, Place and Person)  Thought Content:Tangential    Hallucinations:Hallucinations: None  Ideas of Reference:Delusions  Suicidal Thoughts:Suicidal Thoughts: No  Homicidal Thoughts:Homicidal Thoughts: No   Sensorium  Memory: Immediate Fair  Judgment: Poor  Insight: Lacking   Executive Functions  Concentration: Fair  Attention Span: Fair  Recall: Fair  Fund of Knowledge: Fair  Language: Fair   Psychomotor Activity  Psychomotor Activity: Psychomotor Activity: Normal   Assets  Assets: Desire for Improvement   Sleep  Sleep: Sleep: Poor Number of Hours of Sleep: 4   Nutritional Assessment (For OBS and FBC admissions only) Has the patient had a weight loss or gain of 10 pounds or more in the last 3 months?: No Has the patient had a decrease in food intake/or appetite?: No Does the patient have dental problems?: No Does the patient have eating habits or behaviors that may be indicators of an eating disorder including binging or inducing vomiting?: No Has the patient recently lost weight without trying?: 0 Has the patient been eating poorly because of a decreased appetite?: 0 Malnutrition Screening Tool Score: 0    Physical Exam HENT:     Head: Normocephalic.     Nose: Nose normal.  Eyes:     Pupils: Pupils are equal, round, and reactive to light.  Cardiovascular:     Rate and Rhythm: Normal rate.  Pulmonary:     Effort: Respiratory distress present.  Musculoskeletal:        General: Normal range of motion.     Cervical back: Normal range of motion.  Neurological:     General: No focal deficit present.     Mental Status: She is alert.  Psychiatric:        Mood and Affect: Mood normal.        Behavior: Behavior normal.        Thought Content: Thought  content normal.        Judgment: Judgment normal.    Review of Systems  Constitutional: Negative.   HENT: Negative.    Eyes: Negative.   Respiratory: Negative.    Cardiovascular: Negative.   Gastrointestinal: Negative.   Genitourinary: Negative.   Musculoskeletal: Negative.   Skin: Negative.   Neurological: Negative.   Psychiatric/Behavioral:  Positive for hallucinations. The patient is nervous/anxious.     Blood pressure 132/68, pulse 82, temperature 97.9 F (36.6 C), temperature source Oral, resp. rate 20, SpO2 98%. There is no height or weight on file to calculate BMI.  Past Psychiatric History: Schizoaffective disorder, bipolar disorder  Is the patient at risk to self? No  Has the patient been a risk to self in the past 6 months? No .    Has the patient been a risk to self within the distant past? No   Is the patient a risk to others? No   Has the patient been a risk to others in the past 6 months? No   Has the patient been a risk to others within the distant past? No   Past Medical History: see chart  Family History: unknown  Social History: unknown  Last Labs:  Office Visit on 05/29/2023  Component Date Value Ref Range Status   CRP 05/29/2023 1  0 - 10 mg/L Final   WBC 05/29/2023 8.7  3.4 - 10.8 x10E3/uL Final   RBC 05/29/2023 4.46  3.77 - 5.28 x10E6/uL Final   Hemoglobin 05/29/2023 14.0  11.1 - 15.9 g/dL Final   Hematocrit 16/05/9603 43.2  34.0 - 46.6 % Final   MCV 05/29/2023 97  79 - 97 fL Final   MCH 05/29/2023 31.4  26.6 - 33.0 pg Final   MCHC 05/29/2023 32.4  31.5 - 35.7 g/dL Final   RDW 54/04/8118 12.6  11.7 - 15.4 % Final   Platelets 05/29/2023 278  150 - 450 x10E3/uL Final   Neutrophils 05/29/2023 61  Not Estab. % Final   Lymphs 05/29/2023 25  Not Estab. % Final   Monocytes 05/29/2023 9  Not Estab. % Final   Eos 05/29/2023 3  Not Estab. % Final   Basos 05/29/2023 1  Not Estab. % Final   Neutrophils Absolute 05/29/2023 5.4  1.4 - 7.0 x10E3/uL Final    Lymphocytes Absolute 05/29/2023 2.2  0.7 - 3.1 x10E3/uL Final   Monocytes Absolute 05/29/2023 0.8  0.1 - 0.9 x10E3/uL Final   EOS (ABSOLUTE) 05/29/2023 0.2  0.0 - 0.4 x10E3/uL Final   Basophils Absolute 05/29/2023 0.1  0.0 - 0.2 x10E3/uL Final   Immature Granulocytes 05/29/2023 1  Not Estab. % Final   Immature Grans (Abs) 05/29/2023 0.1  0.0 - 0.1 x10E3/uL Final  Admission on 05/18/2023, Discharged on 05/19/2023  Component Date Value Ref Range Status   Sodium 05/18/2023 136  135 - 145 mmol/L Final   Potassium 05/18/2023 3.6  3.5 - 5.1 mmol/L Final   Chloride 05/18/2023 103  98 - 111 mmol/L Final   CO2 05/18/2023 24  22 - 32 mmol/L Final   Glucose, Bld 05/18/2023 131 (H)  70 - 99 mg/dL Final   Glucose reference range applies only to samples taken after fasting for at least 8 hours.   BUN 05/18/2023 9  8 - 23 mg/dL Final   Creatinine, Ser 05/18/2023 0.76  0.44 - 1.00 mg/dL Final   Calcium 14/78/2956 8.8 (L)  8.9 - 10.3 mg/dL Final   GFR, Estimated 05/18/2023 >60  >60 mL/min Final   Comment: (NOTE) Calculated using the CKD-EPI Creatinine Equation (2021)    Anion gap 05/18/2023 9  5 - 15 Final   Performed at [facility], [address], [place], [place] [address]   WBC 05/18/2023 11.9 (H)  4.0 - 10.5 K/uL Final   RBC 05/18/2023 4.10  3.87 - 5.11 MIL/uL Final   Hemoglobin 05/18/2023 12.9  12.0 - 15.0 g/dL Final   HCT 65/78/4696 38.9  36.0 - 46.0 % Final   MCV 05/18/2023 94.9  80.0 - 100.0 fL Final   MCH 05/18/2023 31.5  26.0 - 34.0 pg Final   MCHC 05/18/2023 33.2  30.0 - 36.0 g/dL Final   RDW 29/52/8413 12.6  11.5 - 15.5 % Final   Platelets 05/18/2023 207  150 - 400 K/uL Final   nRBC 05/18/2023 0.0  0.0 - 0.2 % Final   Performed at [facility], [address], [place], [place] [address]   Color, Urine 05/18/2023 YELLOW  YELLOW Final   APPearance 05/18/2023 CLEAR  CLEAR Final   Specific Gravity, Urine 05/18/2023 1.011  1.005 - 1.030 Final   pH 05/18/2023 7.0  5.0 - 8.0 Final  Glucose, UA 05/18/2023 NEGATIVE  NEGATIVE mg/dL Final   Hgb urine dipstick 05/18/2023 NEGATIVE  NEGATIVE Final   Bilirubin Urine 05/18/2023 NEGATIVE  NEGATIVE Final   Ketones, ur 05/18/2023 NEGATIVE  NEGATIVE mg/dL Final   Protein, ur 57/84/6962 NEGATIVE  NEGATIVE mg/dL Final   Nitrite 95/28/4132 NEGATIVE  NEGATIVE Final   Leukocytes,Ua 05/18/2023 NEGATIVE  NEGATIVE Final   Performed at [facility], [address] [place], [place] [address]   Glucose-Capillary 05/18/2023 132 (H)  70 - 99 mg/dL Final   Glucose reference range applies only to samples taken after fasting for at least 8 hours.   Troponin I (High Sensitivity) 05/18/2023 2  <18 ng/L Final   Comment: (NOTE) Elevated high sensitivity troponin I (hsTnI) values and significant  changes across serial measurements may suggest ACS but many other  chronic and acute conditions are known to elevate hsTnI results.  Refer to the "Links" section for chest pain algorithms and additional  guidance. Performed at [facility], [address], [place], [place] [address]    D-Dimer, Quant 05/18/2023 0.64 (H)  0.00 - 0.50 ug/mL-FEU Final   Comment: (NOTE) At the manufacturer cut-off value of 0.5 g/mL FEU, this assay has a negative predictive value of 95-100%.This assay is intended for use in conjunction with a clinical pretest probability (PTP) assessment model to exclude pulmonary embolism (PE) and deep venous thrombosis (DVT) in outpatients suspected of PE or DVT. Results should be correlated with clinical presentation. Performed at [facility], [address], [place], [place] [address]    Glucose-Capillary 05/18/2023 128 (H)  70 - 99 mg/dL Final   Glucose reference range applies only to samples taken after fasting for at least 8 hours.   Troponin I (High Sensitivity) 05/18/2023 <2  <18 ng/L Final   Comment: (NOTE) Elevated high sensitivity troponin I (hsTnI) values and significant  changes across serial measurements may suggest ACS but  many other  chronic and acute conditions are known to elevate hsTnI results.  Refer to the "Links" section for chest pain algorithms and additional  guidance. Performed at [facility], [address], [place], [place] [address]   Admission on 05/14/2023, Discharged on 05/14/2023  Component Date Value Ref Range Status   Sodium 05/14/2023 140  135 - 145 mmol/L Final   Potassium 05/14/2023 4.1  3.5 - 5.1 mmol/L Final   Chloride 05/14/2023 107  98 - 111 mmol/L Final   CO2 05/14/2023 23  22 - 32 mmol/L Final   Glucose, Bld 05/14/2023 120 (H)  70 - 99 mg/dL Final   Glucose reference range applies only to samples taken after fasting for at least 8 hours.   BUN 05/14/2023 14  8 - 23 mg/dL Final   Creatinine, Ser 05/14/2023 0.88  0.44 - 1.00 mg/dL Final   Calcium 59/56/3875 9.5  8.9 - 10.3 mg/dL Final   GFR, Estimated 05/14/2023 >60  >60 mL/min Final   Comment: (NOTE) Calculated using the CKD-EPI Creatinine Equation (2021)    Anion gap 05/14/2023 10  5 - 15 Final   Performed at [facility], [address] [place], [place] [address]   WBC 05/14/2023 5.2  4.0 - 10.5 K/uL Final   RBC 05/14/2023 4.44  3.87 - 5.11 MIL/uL Final   Hemoglobin 05/14/2023 14.2  12.0 - 15.0 g/dL Final   HCT 95/18/8416 42.1  36.0 - 46.0 % Final   MCV 05/14/2023 94.8  80.0 - 100.0 fL Final   MCH 05/14/2023 32.0  26.0 - 34.0 pg  Final   MCHC 05/14/2023 33.7  30.0 - 36.0 g/dL Final   RDW 27/10/5007 12.4  11.5 - 15.5 % Final   Platelets 05/14/2023 212  150 - 400 K/uL Final   nRBC 05/14/2023 0.0  0.0 - 0.2 % Final   Performed at [facility], [address], [place], [place] [address]   Troponin I (High Sensitivity) 05/14/2023 <2  <18 ng/L Final   Comment: (NOTE) Elevated high sensitivity troponin I (hsTnI) values and significant  changes across serial measurements may suggest ACS but many other  chronic and acute conditions are known to elevate hsTnI results.  Refer to the "Links" section for chest pain algorithms and  additional  guidance. Performed at [facility], [address], [place], [place] [address]    D-Dimer, Quant 05/14/2023 0.36  0.00 - 0.50 ug/mL-FEU Final   Comment: (NOTE) At the manufacturer cut-off value of 0.5 g/mL FEU, this assay has a negative predictive value of 95-100%.This assay is intended for use in conjunction with a clinical pretest probability (PTP) assessment model to exclude pulmonary embolism (PE) and deep venous thrombosis (DVT) in outpatients suspected of PE or DVT. Results should be correlated with clinical presentation. Performed at [facility], [address] [place], [place] [address]    TSH 05/14/2023 1.981  0.350 - 4.500 uIU/mL Final   Comment: Performed by a 3rd Generation assay with a functional sensitivity of <=0.01 uIU/mL. Performed at [facility], [address], [place], [place] [address]    Troponin I (High Sensitivity) 05/14/2023 2  <18 ng/L Final   Comment: (NOTE) Elevated high sensitivity troponin I (hsTnI) values and significant  changes across serial measurements may suggest ACS but many other  chronic and acute conditions are known to elevate hsTnI results.  Refer to the "Links" section for chest pain algorithms and additional  guidance. Performed at [facility], [address], [place], [place] [address]   Admission on 05/11/2023, Discharged on 05/11/2023  Component Date Value Ref Range Status   WBC 05/11/2023 4.7  4.0 - 10.5 K/uL Final   RBC 05/11/2023 4.69  3.87 - 5.11 MIL/uL Final   Hemoglobin 05/11/2023 14.7  12.0 - 15.0 g/dL Final   HCT 25/85/2778 45.2  36.0 - 46.0 % Final   MCV 05/11/2023 96.4  80.0 - 100.0 fL Final   MCH 05/11/2023 31.3  26.0 - 34.0 pg Final   MCHC 05/11/2023 32.5  30.0 - 36.0 g/dL Final   RDW 24/23/5361 12.7  11.5 - 15.5 % Final   Platelets 05/11/2023 187  150 - 400 K/uL Final   nRBC 05/11/2023 0.0  0.0 - 0.2 % Final   Neutrophils Relative % 05/11/2023 38  % Final   Neutro Abs 05/11/2023 1.8  1.7 - 7.7 K/uL Final    Lymphocytes Relative 05/11/2023 37  % Final   Lymphs Abs 05/11/2023 1.8  0.7 - 4.0 K/uL Final   Monocytes Relative 05/11/2023 20  % Final   Monocytes Absolute 05/11/2023 0.9  0.1 - 1.0 K/uL Final   Eosinophils Relative 05/11/2023 4  % Final   Eosinophils Absolute 05/11/2023 0.2  0.0 - 0.5 K/uL Final   Basophils Relative 05/11/2023 1  % Final   Basophils Absolute 05/11/2023 0.0  0.0 - 0.1 K/uL Final   Immature Granulocytes 05/11/2023 0  % Final   Abs Immature Granulocytes 05/11/2023 0.01  0.00 - 0.07 K/uL Final   Performed at [facility], [address] [place], [place] [address]  Sodium 05/11/2023 139  135 - 145 mmol/L Final   Potassium 05/11/2023 4.1  3.5 - 5.1 mmol/L Final   Chloride 05/11/2023 104  98 - 111 mmol/L Final   CO2 05/11/2023 26  22 - 32 mmol/L Final   Glucose, Bld 05/11/2023 119 (H)  70 - 99 mg/dL Final   Glucose reference range applies only to samples taken after fasting for at least 8 hours.   BUN 05/11/2023 13  8 - 23 mg/dL Final   Creatinine, Ser 05/11/2023 0.76  0.44 - 1.00 mg/dL Final   Calcium 08/65/7846 9.2  8.9 - 10.3 mg/dL Final   Total Protein 96/29/5284 7.5  6.5 - 8.1 g/dL Final   Albumin 13/24/4010 3.8  3.5 - 5.0 g/dL Final   AST 27/25/3664 36  15 - 41 U/L Final   ALT 05/11/2023 32  0 - 44 U/L Final   Alkaline Phosphatase 05/11/2023 63  38 - 126 U/L Final   Total Bilirubin 05/11/2023 0.7  0.3 - 1.2 mg/dL Final   GFR, Estimated 05/11/2023 >60  >60 mL/min Final   Comment: (NOTE) Calculated using the CKD-EPI Creatinine Equation (2021)    Anion gap 05/11/2023 9  5 - 15 Final   Performed at [facility], [address] [place], [place] [address]   Lipase 05/11/2023 26  11 - 51 U/L Final   Performed at [facility], [address] [place], [place] [address]   Troponin I (High Sensitivity) 05/11/2023 <2  <18 ng/L Final   Comment: (NOTE) Elevated high sensitivity troponin I (hsTnI) values and significant  changes across  serial measurements may suggest ACS but many other  chronic and acute conditions are known to elevate hsTnI results.  Refer to the "Links" section for chest pain algorithms and additional  guidance. Performed at [facility], [address] [place], [place] [address]   Office Visit on 05/01/2023  Component Date Value Ref Range Status   Hemoglobin A1C 05/01/2023 6.0 (A)  4.0 - 5.6 % Final   POC Glucose 05/01/2023 139 (A)  70 - 99 mg/dl Final   Free T4 64/33/2951 1.01  0.60 - 1.60 ng/dL Final   Comment: Specimens from patients who are undergoing biotin therapy and /or ingesting biotin supplements may contain high levels of biotin.  The higher biotin concentration in these specimens interferes with this Free T4 assay.  Specimens that contain high levels  of biotin may cause false high results for this Free T4 assay.  Please interpret results in light of the total clinical presentation of the patient.     TSH 05/01/2023 2.04  0.35 - 5.50 uIU/mL Final   Sodium 05/01/2023 140  135 - 145 mEq/L Final   Potassium 05/01/2023 4.2  3.5 - 5.1 mEq/L Final   Chloride 05/01/2023 105  96 - 112 mEq/L Final   CO2 05/01/2023 26  19 - 32 mEq/L Final   Glucose, Bld 05/01/2023 103 (H)  70 - 99 mg/dL Final   BUN 88/41/6606 14  6 - 23 mg/dL Final   Creatinine, Ser 05/01/2023 0.87  0.40 - 1.20 mg/dL Final   Total Bilirubin 05/01/2023 0.7  0.2 - 1.2 mg/dL Final   Alkaline Phosphatase 05/01/2023 67  39 - 117 U/L Final   AST 05/01/2023 19  0 - 37 U/L Final   ALT 05/01/2023 18  0 - 35 U/L Final   Total Protein 05/01/2023 7.0  6.0 - 8.3 g/dL Final  Albumin 05/01/2023 4.0  3.5 - 5.2 g/dL Final   GFR 33/29/5188 71.96  >60.00 mL/min Final   Calculated using the CKD-EPI Creatinine Equation (2021)   Calcium 05/01/2023 9.8  8.4 - 10.5 mg/dL Final  Office Visit on 03/02/2023  Component Date Value Ref Range Status   SARS Coronavirus 2 Ag 03/02/2023 Negative  Negative Final   Color, UA 03/02/2023 yellow    Final   Clarity, UA 03/02/2023 clear   Final   Glucose, UA 03/02/2023 Negative  Negative Final   Bilirubin, UA 03/02/2023 neg   Final   Ketones, UA 03/02/2023 neg   Final   Spec Grav, UA 03/02/2023 1.020  1.010 - 1.025 Final   Blood, UA 03/02/2023 neg   Final   pH, UA 03/02/2023 6.0  5.0 - 8.0 Final   Protein, UA 03/02/2023 Positive (A)  Negative Final   Urobilinogen, UA 03/02/2023 negative (A)  0.2 or 1.0 E.U./dL Final   Nitrite, UA 41/66/0630 neg   Final   Leukocytes, UA 03/02/2023 Small (1+) (A)  Negative Final   POC Glucose 03/02/2023 94  70 - 99 mg/dl Final   MICRO NUMBER: 16/08/930 [id number]   Final   SPECIMEN QUALITY: 03/02/2023 Adequate   Final   Sample Source 03/02/2023 URINE   Final   STATUS: 03/02/2023 FINAL   Final   Result: 03/02/2023 Less than 10,000 CFU/mL of single Gram negative organism isolated. No further testing will be performed. If clinically indicated, recollection using a method to minimize contamination, with prompt transfer to Urine Culture Transport Tube, is recommended.   Final  Office Visit on 12/29/2022  Component Date Value Ref Range Status   Hemoglobin A1C 12/29/2022 5.8 (A)  4.0 - 5.6 % Final    Allergies: Morphine and codeine, Ambien [zolpidem], Duloxetine, Latuda [lurasidone hcl], Morphine, and Shingrix [zoster vac recomb adjuvanted]  Medications:  Facility Ordered Medications  Medication   acetaminophen (TYLENOL) tablet 650 mg   alum & mag hydroxide-simeth (MAALOX/MYLANTA) 200-200-20 MG/5ML suspension 30 mL   magnesium hydroxide (MILK OF MAGNESIA) suspension 30 mL   OLANZapine zydis (ZYPREXA) disintegrating tablet 10 mg   And   LORazepam (ATIVAN) tablet 1 mg   And   ziprasidone (GEODON) injection 20 mg   PTA Medications  Medication Sig   albuterol (VENTOLIN HFA) 108 (90 Base) MCG/ACT inhaler Inhale 2 puffs into the lungs every 6 (six) hours as needed for wheezing or shortness of breath.   divalproex (DEPAKOTE ER) 250 MG 24 hr tablet Take 750  mg by mouth at bedtime.   Multiple Vitamin (MULTIVITAMIN WITH MINERALS) TABS tablet Take 1 tablet by mouth daily.   aspirin-acetaminophen-caffeine (EXCEDRIN MIGRAINE) 250-250-65 MG tablet Take 2 tablets by mouth every 6 (six) hours as needed for headache.   Blood Glucose Monitoring Suppl (ONE TOUCH ULTRA 2) w/Device KIT Use to test blood sugars 1-2 times daily.   meclizine (ANTIVERT) 25 MG tablet Take 1 tablet (25 mg total) by mouth 3 (three) times daily as needed for dizziness.   ondansetron (ZOFRAN) 4 MG tablet Take 1 tablet (4 mg total) by mouth every 8 (eight) hours as needed for nausea or vomiting.   Accu-Chek Softclix Lancets lancets USE TO CHECK BLOOD SUGARS 1-2 TIMES DAILY   Alcohol Swabs (ALCOHOL PREP) PADS To use for blood sugar checks.   glucose blood (ACCU-CHEK GUIDE) test strip Use to test blood sugars 1-2 times daily.   terbinafine (LAMISIL) 250 MG tablet Take 1 tablet (250 mg total) by mouth daily.  AUVELITY 45-105 MG TBCR Take 1 tablet by mouth every morning.   pravastatin (PRAVACHOL) 20 MG tablet Take 1 tablet by mouth once daily   levothyroxine (SYNTHROID) 100 MCG tablet Take 1 tablet (100 mcg total) by mouth daily.   aspirin (ASPIRIN CHILDRENS) 81 MG chewable tablet Chew 1 tablet (81 mg total) by mouth daily.   metoprolol tartrate (LOPRESSOR) 25 MG tablet Take 1 tablet (25 mg total) by mouth as directed for 3 doses. Take 1 tablet twice daily 1 day before CT scan and 1 tablet before CT scan   omeprazole (PRILOSEC) 40 MG capsule Take 1 capsule (40 mg total) by mouth daily.   benzonatate (TESSALON) 200 MG capsule Take 1 capsule (200 mg total) by mouth 2 (two) times daily as needed for cough.      Medical Decision Making  Inpatient observation  Meds ordered this encounter  Medications   acetaminophen (TYLENOL) tablet 650 mg   alum & mag hydroxide-simeth (MAALOX/MYLANTA) 200-200-20 MG/5ML suspension 30 mL   magnesium hydroxide (MILK OF MAGNESIA) suspension 30 mL   AND  Linked Order Group    OLANZapine zydis (ZYPREXA) disintegrating tablet 10 mg    LORazepam (ATIVAN) tablet 1 mg    ziprasidone (GEODON) injection 20 mg     Lab Orders         CBC with Differential/Platelet         Comprehensive metabolic panel         Ethanol         TSH         POC urine preg, ED         POCT Urine Drug Screen - (I-Screen)      Recommendations  Based on my evaluation the patient does not appear to have an emergency medical condition.  Sindy Guadeloupe, NP 06/25/23  6:40 AM

## 2023-06-25 NOTE — ED Notes (Signed)
Rn called to have ivc papers serve

## 2023-06-25 NOTE — ED Notes (Signed)
Patient in milieu. Environment is secured. Will continue to monitor for safety. 

## 2023-06-25 NOTE — Group Note (Signed)
Date:  06/25/2023 Time:  9:35 PM  Group Topic/Focus:  Wrap-Up Group:   The focus of this group is to help patients review their daily goal of treatment and discuss progress on daily workbooks.    Participation Level:  Minimal  Participation Quality:  Sharing  Affect:  Appropriate  Cognitive:  Appropriate  Insight: Good  Engagement in Group:  Engaged  Modes of Intervention:  Discussion  Additional Comments:    [name] 06/25/2023, 9:35 PM

## 2023-06-25 NOTE — Tx Team (Signed)
Initial Treatment Plan 06/25/2023 7:00 PM [name] ACZ:[id number]    PATIENT STRESSORS: Marital or family conflict   Other: the pres election     PATIENT STRENGTHS: Ability for insight  Capable of independent living  Communication skills  General fund of knowledge  Supportive family/friends    PATIENT IDENTIFIED PROBLEMS:   "I have not been sleeping."    "A lot is going on in my head."               DISCHARGE CRITERIA:  Ability to meet basic life and health needs Adequate post-discharge living arrangements Improved stabilization in mood, thinking, and/or behavior Safe-care adequate arrangements made Verbal commitment to aftercare and medication compliance  PRELIMINARY DISCHARGE PLAN: Return to previous living arrangement  PATIENT/FAMILY INVOLVEMENT: This treatment plan has been presented to and reviewed with the patient, [name]. The patient has been given the opportunity to ask questions and make suggestions.  Luane School, RN 06/25/2023, 7:00 PM

## 2023-06-25 NOTE — ED Notes (Signed)
Patient alert and oriented x 3. Denies SI/HI/VH. Endorses AH Denies intent or plan to harm self or others. Routine conducted according to faculty protocol. Encourage patient to notify staff with any needs or concerns. Patient verbalized agreement and understanding. Will continue to monitor for safety.

## 2023-06-25 NOTE — BH Assessment (Addendum)
Comprehensive Clinical Assessment (CCA) Note  06/25/2023 Senai Ramnath Brazier [id number]  Disposition: Sindy Guadeloupe, NP, recommends continuous observation for safety and stabilization with psych reassessment in the AM.   The patient demonstrates the following risk factors for suicide: Chronic risk factors for suicide include: psychiatric disorder of bipolar and schizophrenia . Acute risk factors for suicide include: N/A. Protective factors for this patient include: positive therapeutic relationship, responsibility to others (children, family), coping skills, and hope for the future. Considering these factors, the overall suicide risk at this point appears to be moderate. Patient is not appropriate for outpatient follow up.  Mica Ramdass is a 62 year old female presenting as a voluntary walk-in to [facility] due to lack of sleep and delusional. Patient reports history of Bipolar and Schizoaffective Disorder and mild cognitive impairment. Patient denied SI, HI, psychosis and alcohol/drug usage. Patient is accompanied by her significant other, Thressa Sheller. Patient gave consent for Wilkie Aye to be present during assessment.   Patient appears to be confused throughout assessment. Patient feels Wilkie Aye reports patient has been overwhelmed with the presidential election and is fixated on the results for the past 2 weeks. Wilkie Aye reports patient will stay up all night texting [name] while locked in the bathroom. Patient has donated at least $1000 toward the [employer]. Patient feels that [name] is still going to win the election and Vonna Drafts is going to come pick her up. Patient reported worsening depressive symptoms. Patient reported poor sleep and normal appetite.   Patient is currently being seen at [facility] by Dr. Almond Lint. Patient reports taking psych medications as prescribed. Patient feels that psych medications are working. Patient reported psych hospitalization 2015 when she  had a similar psychotic episode. Patient denied prior suicide attempts and self-harming behaviors.   Patient resides with partner and partners son (26). Patient is currently retired and on disability. Patient reported guns in the home, however partner confirmed that her son hunts and keeps his rifle locked up. Patient is was calm and cooperative during assessment. Patient contracts for safety.      Chief Complaint:  Chief Complaint  Patient presents with   Delusional   Visit Diagnosis:  Major depressive disorder   CCA Screening, Triage and Referral (STR)  Patient Reported Information How did you hear about [place]? Family/Friend  What Is the Reason for Your Visit/Call Today? Pt presents to [facility] voluntarily, accompanied by her wife with complaint of lack of sleep and delusional behaviors. Pt reports that she has not been sleeping due to watching the recent election coverage. Per wife, pt locked herself in the bathroom today and believed that she was texting [name]. She reports that pt became fixated with election results. Per wife, pt has donated at least $1000 toward the [employer] over the course of election. Pt wife has had to confiscate her cell phone to minimize usage and for pt to rest. Pt believes that election is not over and that she has [name] as a contact in her phone. Pt has history of Bipolar, Schizoaffective Disorder and mild cognitive impairment. Per wife, pt had an episode in 2015 that began with pt not sleeping and resulted in numerous EMS calls and testings because pt believes that she is not well. Pt currently denies SI,HI,AVH and substance/alcohol use.  How Long Has This Been Causing You Problems? <Week  What Do You Feel Would Help You the Most Today? Treatment for Depression or other mood problem   Have You Recently Had  Any Thoughts About Hurting Yourself? No  Are You Planning to Commit Suicide/Harm Yourself At This time? No   Flowsheet Row ED from 06/25/2023  in [facility] ED from 05/18/2023 in [facility] Department at [facility] ED from 05/14/2023 in [facility] Emergency Department at [facility]  C-SSRS RISK CATEGORY No Risk No Risk No Risk       Have you Recently Had Thoughts About Hurting Someone Karolee Ohs? No  Are You Planning to Harm Someone at This Time? No  Explanation: n/a   Have You Used Any Alcohol or Drugs in the Past 24 Hours? No  What Did You Use and How Much? n/a   Do You Currently Have a Therapist/Psychiatrist? Yes  Name of Therapist/Psychiatrist: Name of Therapist/Psychiatrist: [facility]   Have You Been Recently Discharged From Any Office Practice or Programs? No  Explanation of Discharge From Practice/Program: n/a     CCA Screening Triage Referral Assessment Type of Contact: Face-to-Face  Telemedicine Service Delivery:   Is this Initial or Reassessment?   Date Telepsych consult ordered in [facility]:    Time Telepsych consult ordered in [facility]:    Location of Assessment: [facility] Assessment Services  Provider Location: [facility] Assessment Services   Collateral Involvement: n/a   Does Patient Have a Automotive engineer Guardian? No  Legal Guardian Contact Information: n/a  Copy of Legal Guardianship Form: -- (n/a)  Legal Guardian Notified of Arrival: -- (n/a)  Legal Guardian Notified of Pending Discharge: -- (n/a)  If Minor and Not Living with Parent(s), Who has Custody? n/a  Is CPS involved or ever been involved? Never  Is APS involved or ever been involved? Never   Patient Determined To Be At Risk for Harm To Self or Others Based on Review of Patient Reported Information or Presenting Complaint? No  Method: No Plan  Availability of Means: No access or NA  Intent: Vague intent or NA  Notification Required: No need or identified person  Additional Information for Danger to Others Potential: -- (n/a)  Additional Comments for Danger  to Others Potential: n/a  Are There Guns or Other Weapons in Your Home? Yes  Types of Guns/Weapons: No access  Are These Weapons Safely Secured?                            Yes  Who Could Verify You Are Able To Have These Secured: Wife  Do You Have any Outstanding Charges, Pending Court Dates, Parole/Probation? none reported  Contacted To Inform of Risk of Harm To Self or Others: Family/Significant Other:    Does Patient Present under Involuntary Commitment? No    [place] of Residence: [place]   Patient Currently Receiving the Following Services: Medication Management   Determination of Need: Urgent (48 hours)   Options For Referral: Other: Comment; BH Urgent Care; Outpatient Therapy; Medication Management; Inpatient Hospitalization     CCA Biopsychosocial Patient Reported Schizophrenia/Schizoaffective Diagnosis in Past: No   Strengths: self-awareness   Mental Health Symptoms Depression:   Fatigue; Difficulty Concentrating   Duration of Depressive symptoms:  Duration of Depressive Symptoms: Less than two weeks   Mania:   None   Anxiety:    Worrying; Tension; Fatigue; Difficulty concentrating   Psychosis:   Delusions   Duration of Psychotic symptoms:  Duration of Psychotic Symptoms: Less than six months   Trauma:   None   Obsessions:   None  Compulsions:   None   Inattention:   None   Hyperactivity/Impulsivity:   None   Oppositional/Defiant Behaviors:   None   Emotional Irregularity:   None   Other Mood/Personality Symptoms:   n/a    Mental Status Exam Appearance and self-care  Stature:   Average   Weight:   Average weight   Clothing:   Age-appropriate   Grooming:   Normal   Cosmetic use:   None   Posture/gait:   Normal   Motor activity:   Slowed   Sensorium  Attention:   Normal   Concentration:   Normal   Orientation:   X5   Recall/memory:   Normal   Affect and Mood  Affect:   Appropriate    Mood:   Depressed   Relating  Eye contact:   Normal   Facial expression:   Depressed   Attitude toward examiner:   Cooperative   Thought and Language  Speech flow:  Soft; Slow   Thought content:   Appropriate to Mood and Circumstances   Preoccupation:   None   Hallucinations:   Other (Comment)   Organization:   Patent examiner of Knowledge:   Average   Intelligence:   Average   Abstraction:   Normal   Judgement:   Fair   Dance movement psychotherapist:   Distorted   Insight:   Lacking   Decision Making:   Normal   Social Functioning  Social Maturity:   Responsible   Social Judgement:   Naive   Stress  Stressors:   Other (Comment) (election)   Coping Ability:  poor  Skill Deficits:   Decision making   Supports:   Family     Religion: Religion/Spirituality Are You A Religious Person?: Yes What is Your Religious Affiliation?:  (unknown) How Might This Affect Treatment?: none  Leisure/Recreation: Leisure / Recreation Do You Have Hobbies?: Yes Leisure and Hobbies: family  Exercise/Diet: Exercise/Diet Do You Exercise?: No Have You Gained or Lost A Significant Amount of Weight in the Past Six Months?: No Do You Follow a Special Diet?: Yes Type of Diet: unknown Do You Have Any Trouble Sleeping?: Yes Explanation of Sleeping Difficulties: poor since election   CCA Employment/Education Employment/Work Situation: Employment / Work Situation Employment Situation: On disability Why is Patient on Disability: medical and mental How Long has Patient Been on Disability: unknown Patient's Job has Been Impacted by Current Illness:  (n/a) Describe how Patient's Job has Been Impacted: n/a Has Patient ever Been in the [employer]?: No  Education: Education Is Patient Currently Attending School?: No Last Grade Completed: 12 Did You Attend College?:  (unknown) Did You Have An Individualized Education Program (IIEP):   (unknown) Did You Have Any Difficulty At School?:  (unknown) Patient's Education Has Been Impacted by Current Illness:  (unknown)   CCA Family/Childhood History Family and Relationship History: Family history Marital status: Single Number of Years Married:  (n/a) What types of issues is patient dealing with in the relationship?: n/a Additional relationship information: n/a Does patient have children?: No  Childhood History:  Childhood History By whom was/is the patient raised?: Mother Did patient suffer any verbal/emotional/physical/sexual abuse as a child?: No Did patient suffer from severe childhood neglect?: No Has patient ever been sexually abused/assaulted/raped as an adolescent or adult?: No Was the patient ever a victim of a crime or a disaster?: No Witnessed domestic violence?: No Has patient been affected by domestic violence as an adult?: No  CCA Substance Use Alcohol/Drug Use: Alcohol / Drug Use Pain Medications: see MAR Prescriptions: see MAR Over the Counter: see MAR History of alcohol / drug use?: No history of alcohol / drug abuse Longest period of sobriety (when/how long): n/a Negative Consequences of Use:  (na) Withdrawal Symptoms:  (na)                         ASAM's:  Six Dimensions of Multidimensional Assessment  Dimension 1:  Acute Intoxication and/or Withdrawal Potential:   Dimension 1:  Description of individual's past and current experiences of substance use and withdrawal: n/a  Dimension 2:  Biomedical Conditions and Complications:   Dimension 2:  Description of patient's biomedical conditions and  complications: n/a  Dimension 3:  Emotional, Behavioral, or Cognitive Conditions and Complications:  Dimension 3:  Description of emotional, behavioral, or cognitive conditions and complications: n/a  Dimension 4:  Readiness to Change:  Dimension 4:  Description of Readiness to Change criteria: n/a  Dimension 5:  Relapse, Continued  use, or Continued Problem Potential:  Dimension 5:  Relapse, continued use, or continued problem potential critiera description: n/a  Dimension 6:  Recovery/Living Environment:  Dimension 6:  Recovery/Iiving environment criteria description: n/a  ASAM Severity Score:    ASAM Recommended Level of Treatment: ASAM Recommended Level of Treatment:  (n/a)   Substance use Disorder (SUD) Substance Use Disorder (SUD)  Checklist Symptoms of Substance Use:  (n/a)  Recommendations for Services/Supports/Treatments: Recommendations for Services/Supports/Treatments Recommendations For Services/Supports/Treatments: Individual Therapy, Inpatient Hospitalization, Medication Management, Other (Comment) (Observation)  Discharge Disposition: Discharge Disposition Medical Exam completed: Yes Disposition of Patient: Admit  DSM5 Diagnoses: Patient Active Problem List   Diagnosis Date Noted   Atherosclerosis of aorta (HCC) 05/22/2023   Chronic diarrhea 05/01/2023   Unstable gait 07/05/2022   Noninvasive follicular neoplasm of thyroid with papillary-like nuclear features 10/10/2021   Type II diabetes mellitus 10/10/2021   Postsurgical hypothyroidism 09/03/2021   Vitamin B12 deficiency 09/03/2021   Female stress incontinence 11/15/2020   History of hysterectomy 11/15/2020   Mass of urethra 11/15/2020   Osteopenia 11/15/2020   Chronic interstitial cystitis 11/15/2020   Mild cognitive impairment of uncertain or unknown etiology 10/16/2020   Social anxiety disorder    Elevated lactic acid level    Diverticular disease of colon 08/06/2020   Fecal urgency 08/06/2020   Gastroesophageal reflux disease 08/06/2020   Irritable bowel syndrome 08/06/2020   Left upper quadrant pain 08/06/2020   Periumbilical pain 08/06/2020   Pruritus ani 08/06/2020   Chronic fatigue 06/21/2017   Hyperlipidemia, mixed 05/12/2017   Insomnia 05/12/2017   Multinodular goiter 05/12/2017   Hoarseness of voice 02/27/2016    Hyperprolactinemia 06/26/2015   Autonomic dysfunction 01/11/2015   Bipolar 1 disorder 01/11/2015   Nondiabetic gastroparesis 01/11/2015   History of lithium toxicity 05/01/2014   Leukocytosis 05/01/2014   Costochondritis 02/14/2014   Fatty liver disease, nonalcoholic 02/13/2014   Early satiety 04/27/2013   Abnormal LFTs 04/06/2013   Dysuria 02/15/2013   Lump of breast, left 12/20/2012   Loss of weight 11/08/2012   Food aversion 11/08/2012   Constipation 05/17/2012   Jaw pain 02/13/2012   Vitamin D deficiency 09/23/2011   Double vision 03/15/2011   Hyperglycemia 01/10/2011   Dyspnea on effort 01/10/2011   Palpitations 12/08/2010   Endometriosis 05/11/2008   Plantar fasciitis 06/11/2007   Allergic rhinitis 03/26/2007     Referrals to Alternative Service(s): Referred to Alternative Service(s):   Place:  Date:   Time:    Referred to Alternative Service(s):   Place:   Date:   Time:    Referred to Alternative Service(s):   Place:   Date:   Time:    Referred to Alternative Service(s):   Place:   Date:   Time:     Burnetta Sabin, [facility]

## 2023-06-25 NOTE — ED Notes (Signed)
Patient has been brought on unit familiarized with unit, is sitting in bed eating

## 2023-06-25 NOTE — ED Provider Notes (Signed)
Behavioral Health Progress Note  Date and Time: 06/25/2023 10:19 AM Name: [name] MRN:  [id number]  Subjective:   Pt chart reviewed and assessed face to face. Pt seen earlier this morning and recommended for observation. On my assessment with pt, pt reports her mood is "ok". She states she is not sure why she is here and does not think she is going through a psychiatric crisis. She denies suicidal, homicidal ideations. She denies auditory visual hallucinations. When asked about paranoia, reports she was scared at home because she believes people are out to kill her because "I have a lot of knowledge". When asked what knowledge she has, she states she has been "documenting" things going on in her family as well as with the election. She reports she slept well while at this facility, although appears she was just admitted this morning. Pt states she lives with her wife, [name], and her son, [name]. She is independent of ADLs. States she does have a walker and cane at home but she does not use it every day. Pt states she has been diagnosed with schizoaffective disorder, bipolar type, for which she takes depakote. Reports she used to take lithium although overdosed on it during a psychotic episode. When asked whether the overdose was a suicidal attempt, states she is not sure.   Spoke w/ pt's wife, [name], [phone]. She states that pt has been worsening over the course of the past week. Pt locked herself in the bathroom this morning and was texting on her phone, believing she was texting with [name]. Patient texted her name, date of birth, and social security number to this person. Also told this person not to give up on the election. [name] states pt also gave at least $1000 to the [employer], when this is not something they can afford. Pt has not been sleeping well either. [name] states pt has a walker and cane at home but pt can walk without them. Pt completes  ADLs independently. Pt's current medications wer   Pt placed under IVC and recommended for inpatient psychiatric admission.     Diagnosis:  Final diagnoses:  Schizoaffective disorder, bipolar type (HCC)  Behavior concern in adult  Other insomnia   Total Time spent with patient: 45 minutes  Past Psychiatric History: Reported history of schizoaffective disorder, bipolar type; per chart review, history of mild cognitive impairment  Past Medical History: Per chart review, hx of diabetes mellitus type II, gerd, IBS, fatty liver disease, endometriosis, plantar fascitis Family History: None reported Family Psychiatric  History: None reported Social History: Living with wife and son  Additional Social History:    Pain Medications: see MAR Prescriptions: see MAR Over the Counter: see MAR History of alcohol / drug use?: No history of alcohol / drug abuse Longest period of sobriety (when/how long): n/a Negative Consequences of Use:  (na) Withdrawal Symptoms:  (na)                    Sleep: Poor  Appetite:  Fair  Current Medications:  Current Facility-Administered Medications  Medication Dose Route Frequency Provider Last Rate Last Admin   acetaminophen (TYLENOL) tablet 650 mg  650 mg Oral Q6H PRN Sindy Guadeloupe, NP       alum & mag hydroxide-simeth (MAALOX/MYLANTA) 200-200-20 MG/5ML suspension 30 mL  30 mL Oral Q4H PRN Sindy Guadeloupe, NP       divalproex (DEPAKOTE ER) 24 hr tablet 750 mg  750 mg Oral  QHS Lauree Chandler, NP       levothyroxine (SYNTHROID) tablet 100 mcg  100 mcg Oral Daily Lauree Chandler, NP   100 mcg at 06/25/23 1017   OLANZapine zydis (ZYPREXA) disintegrating tablet 10 mg  10 mg Oral Q8H PRN Sindy Guadeloupe, NP       And   LORazepam (ATIVAN) tablet 1 mg  1 mg Oral PRN Sindy Guadeloupe, NP       And   ziprasidone (GEODON) injection 20 mg  20 mg Intramuscular PRN Sindy Guadeloupe, NP       magnesium hydroxide (MILK OF MAGNESIA) suspension 30 mL  30 mL Oral  Daily PRN Sindy Guadeloupe, NP       pravastatin (PRAVACHOL) tablet 20 mg  20 mg Oral Daily Lauree Chandler, NP   20 mg at 06/25/23 1017   Current Outpatient Medications  Medication Sig Dispense Refill   albuterol (VENTOLIN HFA) 108 (90 Base) MCG/ACT inhaler Inhale 2 puffs into the lungs every 6 (six) hours as needed for wheezing or shortness of breath. 18 g 1   aspirin (ASPIRIN CHILDRENS) 81 MG chewable tablet Chew 1 tablet (81 mg total) by mouth daily.     aspirin-acetaminophen-caffeine (EXCEDRIN MIGRAINE) 250-250-65 MG tablet Take 2 tablets by mouth every 6 (six) hours as needed for headache.     AUVELITY 45-105 MG TBCR Take 1 tablet by mouth every morning.     benzonatate (TESSALON) 200 MG capsule Take 1 capsule (200 mg total) by mouth 2 (two) times daily as needed for cough. 20 capsule 0   divalproex (DEPAKOTE ER) 250 MG 24 hr tablet Take 750 mg by mouth at bedtime.     levothyroxine (SYNTHROID) 100 MCG tablet Take 1 tablet (100 mcg total) by mouth daily. 90 tablet 3   meclizine (ANTIVERT) 25 MG tablet Take 1 tablet (25 mg total) by mouth 3 (three) times daily as needed for dizziness. 30 tablet 0   metoprolol tartrate (LOPRESSOR) 25 MG tablet Take 1 tablet (25 mg total) by mouth as directed for 3 doses. Take 1 tablet twice daily 1 day before CT scan and 1 tablet before CT scan 3 tablet 0   Multiple Vitamin (MULTIVITAMIN WITH MINERALS) TABS tablet Take 1 tablet by mouth daily.     omeprazole (PRILOSEC) 40 MG capsule Take 1 capsule (40 mg total) by mouth daily. 60 capsule 0   ondansetron (ZOFRAN) 4 MG tablet Take 1 tablet (4 mg total) by mouth every 8 (eight) hours as needed for nausea or vomiting. 20 tablet 0   pravastatin (PRAVACHOL) 20 MG tablet Take 1 tablet by mouth once daily 90 tablet 1   terbinafine (LAMISIL) 250 MG tablet Take 1 tablet (250 mg total) by mouth daily. 90 tablet 0    Labs  Lab Results:  Admission on 06/25/2023  Component Date Value Ref Range Status   WBC 06/25/2023  8.6  4.0 - 10.5 K/uL Final   RBC 06/25/2023 4.50  3.87 - 5.11 MIL/uL Final   Hemoglobin 06/25/2023 14.0  12.0 - 15.0 g/dL Final   HCT 16/05/9603 43.6  36.0 - 46.0 % Final   MCV 06/25/2023 96.9  80.0 - 100.0 fL Final   MCH 06/25/2023 31.1  26.0 - 34.0 pg Final   MCHC 06/25/2023 32.1  30.0 - 36.0 g/dL Final   RDW 54/04/8118 12.6  11.5 - 15.5 % Final   Platelets 06/25/2023 284  150 - 400 K/uL Final   nRBC 06/25/2023 0.0  0.0 -  0.2 % Final   Neutrophils Relative % 06/25/2023 61  % Final   Neutro Abs 06/25/2023 5.3  1.7 - 7.7 K/uL Final   Lymphocytes Relative 06/25/2023 25  % Final   Lymphs Abs 06/25/2023 2.2  0.7 - 4.0 K/uL Final   Monocytes Relative 06/25/2023 9  % Final   Monocytes Absolute 06/25/2023 0.8  0.1 - 1.0 K/uL Final   Eosinophils Relative 06/25/2023 4  % Final   Eosinophils Absolute 06/25/2023 0.3  0.0 - 0.5 K/uL Final   Basophils Relative 06/25/2023 1  % Final   Basophils Absolute 06/25/2023 0.1  0.0 - 0.1 K/uL Final   Immature Granulocytes 06/25/2023 0  % Final   Abs Immature Granulocytes 06/25/2023 0.03  0.00 - 0.07 K/uL Final   Performed at [facility], [address], [place], [place] [address]   Sodium 06/25/2023 137  135 - 145 mmol/L Final   Potassium 06/25/2023 4.1  3.5 - 5.1 mmol/L Final   Chloride 06/25/2023 99  98 - 111 mmol/L Final   CO2 06/25/2023 24  22 - 32 mmol/L Final   Glucose, Bld 06/25/2023 125 (H)  70 - 99 mg/dL Final   Glucose reference range applies only to samples taken after fasting for at least 8 hours.   BUN 06/25/2023 15  8 - 23 mg/dL Final   Creatinine, Ser 06/25/2023 0.71  0.44 - 1.00 mg/dL Final   Calcium 62/83/1517 9.8  8.9 - 10.3 mg/dL Final   Total Protein 61/60/7371 8.1  6.5 - 8.1 g/dL Final   Albumin 02/11/9484 3.6  3.5 - 5.0 g/dL Final   AST 46/27/0350 26  15 - 41 U/L Final   ALT 06/25/2023 28  0 - 44 U/L Final   Alkaline Phosphatase 06/25/2023 68  38 - 126 U/L Final   Total Bilirubin 06/25/2023 0.8  <1.2 mg/dL Final   GFR,  Estimated 06/25/2023 >60  >60 mL/min Final   Comment: (NOTE) Calculated using the CKD-EPI Creatinine Equation (2021)    Anion gap 06/25/2023 14  5 - 15 Final   Performed at [facility], [address] [place], [place] [address]   Alcohol, Ethyl (B) 06/25/2023 <10  <10 mg/dL Final   Comment: (NOTE) Lowest detectable limit for serum alcohol is 10 mg/dL.  For medical purposes only. Performed at [facility], [address], [place], [place] [address]    TSH 06/25/2023 6.382 (H)  0.350 - 4.500 uIU/mL Final   Comment: Performed by a 3rd Generation assay with a functional sensitivity of <=0.01 uIU/mL. Performed at [facility], [address], [place], [place] [address]    Preg Test, Ur 06/25/2023 Negative  Negative Final   POC Amphetamine UR 06/25/2023 None Detected  NONE DETECTED (Cut Off Level 1000 ng/mL) Final   POC Secobarbital (BAR) 06/25/2023 None Detected  NONE DETECTED (Cut Off Level 300 ng/mL) Final   POC Buprenorphine (BUP) 06/25/2023 None Detected  NONE DETECTED (Cut Off Level 10 ng/mL) Final   POC Oxazepam (BZO) 06/25/2023 Positive (A)  NONE DETECTED (Cut Off Level 300 ng/mL) Final   POC Cocaine UR 06/25/2023 None Detected  NONE DETECTED (Cut Off Level 300 ng/mL) Final   POC Methamphetamine UR 06/25/2023 None Detected  NONE DETECTED (Cut Off Level 1000 ng/mL) Final   POC Morphine 06/25/2023 None Detected  NONE DETECTED (Cut Off Level 300 ng/mL) Final   POC Methadone UR 06/25/2023 None Detected  NONE DETECTED (Cut Off Level 300 ng/mL) Final  POC Oxycodone UR 06/25/2023 None Detected  NONE DETECTED (Cut Off Level 100 ng/mL) Final   POC Marijuana UR 06/25/2023 None Detected  NONE DETECTED (Cut Off Level 50 ng/mL) Final  Office Visit on 05/29/2023  Component Date Value Ref Range Status   CRP 05/29/2023 1  0 - 10 mg/L Final   WBC 05/29/2023 8.7  3.4 - 10.8 x10E3/uL Final   RBC 05/29/2023 4.46  3.77 - 5.28 x10E6/uL Final   Hemoglobin 05/29/2023 14.0  11.1 -  15.9 g/dL Final   Hematocrit 95/62/1308 43.2  34.0 - 46.6 % Final   MCV 05/29/2023 97  79 - 97 fL Final   MCH 05/29/2023 31.4  26.6 - 33.0 pg Final   MCHC 05/29/2023 32.4  31.5 - 35.7 g/dL Final   RDW 65/78/4696 12.6  11.7 - 15.4 % Final   Platelets 05/29/2023 278  150 - 450 x10E3/uL Final   Neutrophils 05/29/2023 61  Not Estab. % Final   Lymphs 05/29/2023 25  Not Estab. % Final   Monocytes 05/29/2023 9  Not Estab. % Final   Eos 05/29/2023 3  Not Estab. % Final   Basos 05/29/2023 1  Not Estab. % Final   Neutrophils Absolute 05/29/2023 5.4  1.4 - 7.0 x10E3/uL Final   Lymphocytes Absolute 05/29/2023 2.2  0.7 - 3.1 x10E3/uL Final   Monocytes Absolute 05/29/2023 0.8  0.1 - 0.9 x10E3/uL Final   EOS (ABSOLUTE) 05/29/2023 0.2  0.0 - 0.4 x10E3/uL Final   Basophils Absolute 05/29/2023 0.1  0.0 - 0.2 x10E3/uL Final   Immature Granulocytes 05/29/2023 1  Not Estab. % Final   Immature Grans (Abs) 05/29/2023 0.1  0.0 - 0.1 x10E3/uL Final  Admission on 05/18/2023, Discharged on 05/19/2023  Component Date Value Ref Range Status   Sodium 05/18/2023 136  135 - 145 mmol/L Final   Potassium 05/18/2023 3.6  3.5 - 5.1 mmol/L Final   Chloride 05/18/2023 103  98 - 111 mmol/L Final   CO2 05/18/2023 24  22 - 32 mmol/L Final   Glucose, Bld 05/18/2023 131 (H)  70 - 99 mg/dL Final   Glucose reference range applies only to samples taken after fasting for at least 8 hours.   BUN 05/18/2023 9  8 - 23 mg/dL Final   Creatinine, Ser 05/18/2023 0.76  0.44 - 1.00 mg/dL Final   Calcium 29/52/8413 8.8 (L)  8.9 - 10.3 mg/dL Final   GFR, Estimated 05/18/2023 >60  >60 mL/min Final   Comment: (NOTE) Calculated using the CKD-EPI Creatinine Equation (2021)    Anion gap 05/18/2023 9  5 - 15 Final   Performed at [facility], [address], [place], [place] [address]   WBC 05/18/2023 11.9 (H)  4.0 - 10.5 K/uL Final   RBC 05/18/2023 4.10  3.87 - 5.11 MIL/uL Final   Hemoglobin 05/18/2023 12.9  12.0 - 15.0 g/dL Final   HCT  02/72/5366 38.9  36.0 - 46.0 % Final   MCV 05/18/2023 94.9  80.0 - 100.0 fL Final   MCH 05/18/2023 31.5  26.0 - 34.0 pg Final   MCHC 05/18/2023 33.2  30.0 - 36.0 g/dL Final   RDW 44/10/4740 12.6  11.5 - 15.5 % Final   Platelets 05/18/2023 207  150 - 400 K/uL Final   nRBC 05/18/2023 0.0  0.0 - 0.2 % Final   Performed at [facility], [address] [place], [place] [address]   Color, Urine 05/18/2023 YELLOW  YELLOW Final   APPearance 05/18/2023 CLEAR  CLEAR Final  Specific Gravity, Urine 05/18/2023 1.011  1.005 - 1.030 Final   pH 05/18/2023 7.0  5.0 - 8.0 Final   Glucose, UA 05/18/2023 NEGATIVE  NEGATIVE mg/dL Final   Hgb urine dipstick 05/18/2023 NEGATIVE  NEGATIVE Final   Bilirubin Urine 05/18/2023 NEGATIVE  NEGATIVE Final   Ketones, ur 05/18/2023 NEGATIVE  NEGATIVE mg/dL Final   Protein, ur 16/05/9603 NEGATIVE  NEGATIVE mg/dL Final   Nitrite 54/04/8118 NEGATIVE  NEGATIVE Final   Leukocytes,Ua 05/18/2023 NEGATIVE  NEGATIVE Final   Performed at [facility], [address], [place], [place] [address]   Glucose-Capillary 05/18/2023 132 (H)  70 - 99 mg/dL Final   Glucose reference range applies only to samples taken after fasting for at least 8 hours.   Troponin I (High Sensitivity) 05/18/2023 2  <18 ng/L Final   Comment: (NOTE) Elevated high sensitivity troponin I (hsTnI) values and significant  changes across serial measurements may suggest ACS but many other  chronic and acute conditions are known to elevate hsTnI results.  Refer to the "Links" section for chest pain algorithms and additional  guidance. Performed at [facility], [address], [place], [place] [address]    D-Dimer, Quant 05/18/2023 0.64 (H)  0.00 - 0.50 ug/mL-FEU Final   Comment: (NOTE) At the manufacturer cut-off value of 0.5 g/mL FEU, this assay has a negative predictive value of 95-100%.This assay is intended for use in conjunction with a clinical pretest probability (PTP) assessment model to exclude  pulmonary embolism (PE) and deep venous thrombosis (DVT) in outpatients suspected of PE or DVT. Results should be correlated with clinical presentation. Performed at [facility], [address] [place], [place] [address]    Glucose-Capillary 05/18/2023 128 (H)  70 - 99 mg/dL Final   Glucose reference range applies only to samples taken after fasting for at least 8 hours.   Troponin I (High Sensitivity) 05/18/2023 <2  <18 ng/L Final   Comment: (NOTE) Elevated high sensitivity troponin I (hsTnI) values and significant  changes across serial measurements may suggest ACS but many other  chronic and acute conditions are known to elevate hsTnI results.  Refer to the "Links" section for chest pain algorithms and additional  guidance. Performed at [facility], [address], [place], [place] [address]   Admission on 05/14/2023, Discharged on 05/14/2023  Component Date Value Ref Range Status   Sodium 05/14/2023 140  135 - 145 mmol/L Final   Potassium 05/14/2023 4.1  3.5 - 5.1 mmol/L Final   Chloride 05/14/2023 107  98 - 111 mmol/L Final   CO2 05/14/2023 23  22 - 32 mmol/L Final   Glucose, Bld 05/14/2023 120 (H)  70 - 99 mg/dL Final   Glucose reference range applies only to samples taken after fasting for at least 8 hours.   BUN 05/14/2023 14  8 - 23 mg/dL Final   Creatinine, Ser 05/14/2023 0.88  0.44 - 1.00 mg/dL Final   Calcium 62/95/2841 9.5  8.9 - 10.3 mg/dL Final   GFR, Estimated 05/14/2023 >60  >60 mL/min Final   Comment: (NOTE) Calculated using the CKD-EPI Creatinine Equation (2021)    Anion gap 05/14/2023 10  5 - 15 Final   Performed at [facility], [address], [place], [place] [address]   WBC 05/14/2023 5.2  4.0 - 10.5 K/uL Final   RBC 05/14/2023 4.44  3.87 - 5.11 MIL/uL Final   Hemoglobin 05/14/2023 14.2  12.0 - 15.0 g/dL Final   HCT 06/14/2535 42.1  36.0 - 46.0 %  Final   MCV 05/14/2023 94.8  80.0 - 100.0 fL Final   MCH 05/14/2023 32.0  26.0 - 34.0 pg Final   MCHC  05/14/2023 33.7  30.0 - 36.0 g/dL Final   RDW 95/62/1308 12.4  11.5 - 15.5 % Final   Platelets 05/14/2023 212  150 - 400 K/uL Final   nRBC 05/14/2023 0.0  0.0 - 0.2 % Final   Performed at [facility], [address], [place], [place] [address]   Troponin I (High Sensitivity) 05/14/2023 <2  <18 ng/L Final   Comment: (NOTE) Elevated high sensitivity troponin I (hsTnI) values and significant  changes across serial measurements may suggest ACS but many other  chronic and acute conditions are known to elevate hsTnI results.  Refer to the "Links" section for chest pain algorithms and additional  guidance. Performed at [facility], [address], [place], [place] [address]    D-Dimer, Quant 05/14/2023 0.36  0.00 - 0.50 ug/mL-FEU Final   Comment: (NOTE) At the manufacturer cut-off value of 0.5 g/mL FEU, this assay has a negative predictive value of 95-100%.This assay is intended for use in conjunction with a clinical pretest probability (PTP) assessment model to exclude pulmonary embolism (PE) and deep venous thrombosis (DVT) in outpatients suspected of PE or DVT. Results should be correlated with clinical presentation. Performed at [facility], [address] [place], [place] [address]    TSH 05/14/2023 1.981  0.350 - 4.500 uIU/mL Final   Comment: Performed by a 3rd Generation assay with a functional sensitivity of <=0.01 uIU/mL. Performed at [facility], [address] [place], [place] [address]    Troponin I (High Sensitivity) 05/14/2023 2  <18 ng/L Final   Comment: (NOTE) Elevated high sensitivity troponin I (hsTnI) values and significant  changes across serial measurements may suggest ACS but many other  chronic and acute conditions are known to elevate hsTnI results.  Refer to the "Links" section for chest pain algorithms and additional  guidance. Performed at [facility], [address], [place], [place] [address]   Admission on 05/11/2023, Discharged on 05/11/2023  Component  Date Value Ref Range Status   WBC 05/11/2023 4.7  4.0 - 10.5 K/uL Final   RBC 05/11/2023 4.69  3.87 - 5.11 MIL/uL Final   Hemoglobin 05/11/2023 14.7  12.0 - 15.0 g/dL Final   HCT 53/66/4403 45.2  36.0 - 46.0 % Final   MCV 05/11/2023 96.4  80.0 - 100.0 fL Final   MCH 05/11/2023 31.3  26.0 - 34.0 pg Final   MCHC 05/11/2023 32.5  30.0 - 36.0 g/dL Final   RDW 47/42/5956 12.7  11.5 - 15.5 % Final   Platelets 05/11/2023 187  150 - 400 K/uL Final   nRBC 05/11/2023 0.0  0.0 - 0.2 % Final   Neutrophils Relative % 05/11/2023 38  % Final   Neutro Abs 05/11/2023 1.8  1.7 - 7.7 K/uL Final   Lymphocytes Relative 05/11/2023 37  % Final   Lymphs Abs 05/11/2023 1.8  0.7 - 4.0 K/uL Final   Monocytes Relative 05/11/2023 20  % Final   Monocytes Absolute 05/11/2023 0.9  0.1 - 1.0 K/uL Final   Eosinophils Relative 05/11/2023 4  % Final   Eosinophils Absolute 05/11/2023 0.2  0.0 - 0.5 K/uL Final   Basophils Relative 05/11/2023 1  % Final   Basophils Absolute 05/11/2023 0.0  0.0 - 0.1 K/uL Final   Immature Granulocytes 05/11/2023 0  % Final   Abs Immature Granulocytes 05/11/2023 0.01  0.00 -  0.07 K/uL Final   Performed at [facility], [address] [place], [place] [address]   Sodium 05/11/2023 139  135 - 145 mmol/L Final   Potassium 05/11/2023 4.1  3.5 - 5.1 mmol/L Final   Chloride 05/11/2023 104  98 - 111 mmol/L Final   CO2 05/11/2023 26  22 - 32 mmol/L Final   Glucose, Bld 05/11/2023 119 (H)  70 - 99 mg/dL Final   Glucose reference range applies only to samples taken after fasting for at least 8 hours.   BUN 05/11/2023 13  8 - 23 mg/dL Final   Creatinine, Ser 05/11/2023 0.76  0.44 - 1.00 mg/dL Final   Calcium 91/47/8295 9.2  8.9 - 10.3 mg/dL Final   Total Protein 62/13/0865 7.5  6.5 - 8.1 g/dL Final   Albumin 78/46/9629 3.8  3.5 - 5.0 g/dL Final   AST 52/84/1324 36  15 - 41 U/L Final   ALT 05/11/2023 32  0 - 44 U/L Final   Alkaline Phosphatase 05/11/2023 63  38 - 126 U/L Final   Total  Bilirubin 05/11/2023 0.7  0.3 - 1.2 mg/dL Final   GFR, Estimated 05/11/2023 >60  >60 mL/min Final   Comment: (NOTE) Calculated using the CKD-EPI Creatinine Equation (2021)    Anion gap 05/11/2023 9  5 - 15 Final   Performed at [facility], [address] [place], [place] [address]   Lipase 05/11/2023 26  11 - 51 U/L Final   Performed at [facility], [address] [place], [place] [address]   Troponin I (High Sensitivity) 05/11/2023 <2  <18 ng/L Final   Comment: (NOTE) Elevated high sensitivity troponin I (hsTnI) values and significant  changes across serial measurements may suggest ACS but many other  chronic and acute conditions are known to elevate hsTnI results.  Refer to the "Links" section for chest pain algorithms and additional  guidance. Performed at [facility], [address] [place], [place] [address]   Office Visit on 05/01/2023  Component Date Value Ref Range Status   Hemoglobin A1C 05/01/2023 6.0 (A)  4.0 - 5.6 % Final   POC Glucose 05/01/2023 139 (A)  70 - 99 mg/dl Final   Free T4 47/42/5956 1.01  0.60 - 1.60 ng/dL Final   Comment: Specimens from patients who are undergoing biotin therapy and /or ingesting biotin supplements may contain high levels of biotin.  The higher biotin concentration in these specimens interferes with this Free T4 assay.  Specimens that contain high levels  of biotin may cause false high results for this Free T4 assay.  Please interpret results in light of the total clinical presentation of the patient.     TSH 05/01/2023 2.04  0.35 - 5.50 uIU/mL Final   Sodium 05/01/2023 140  135 - 145 mEq/L Final   Potassium 05/01/2023 4.2  3.5 - 5.1 mEq/L Final   Chloride 05/01/2023 105  96 - 112 mEq/L Final   CO2 05/01/2023 26  19 - 32 mEq/L Final   Glucose, Bld 05/01/2023 103 (H)  70 - 99 mg/dL Final   BUN 38/75/6433 14  6 - 23 mg/dL Final   Creatinine, Ser 05/01/2023 0.87  0.40 - 1.20 mg/dL Final   Total Bilirubin  05/01/2023 0.7  0.2 - 1.2 mg/dL Final   Alkaline Phosphatase 05/01/2023 67  39 - 117 U/L Final   AST 05/01/2023 19  0 - 37 U/L Final   ALT  05/01/2023 18  0 - 35 U/L Final   Total Protein 05/01/2023 7.0  6.0 - 8.3 g/dL Final   Albumin 29/52/8413 4.0  3.5 - 5.2 g/dL Final   GFR 24/40/1027 71.96  >60.00 mL/min Final   Calculated using the CKD-EPI Creatinine Equation (2021)   Calcium 05/01/2023 9.8  8.4 - 10.5 mg/dL Final  Office Visit on 03/02/2023  Component Date Value Ref Range Status   SARS Coronavirus 2 Ag 03/02/2023 Negative  Negative Final   Color, UA 03/02/2023 yellow   Final   Clarity, UA 03/02/2023 clear   Final   Glucose, UA 03/02/2023 Negative  Negative Final   Bilirubin, UA 03/02/2023 neg   Final   Ketones, UA 03/02/2023 neg   Final   Spec Grav, UA 03/02/2023 1.020  1.010 - 1.025 Final   Blood, UA 03/02/2023 neg   Final   pH, UA 03/02/2023 6.0  5.0 - 8.0 Final   Protein, UA 03/02/2023 Positive (A)  Negative Final   Urobilinogen, UA 03/02/2023 negative (A)  0.2 or 1.0 E.U./dL Final   Nitrite, UA 25/36/6440 neg   Final   Leukocytes, UA 03/02/2023 Small (1+) (A)  Negative Final   POC Glucose 03/02/2023 94  70 - 99 mg/dl Final   MICRO NUMBER: 34/74/2595 [id number]   Final   SPECIMEN QUALITY: 03/02/2023 Adequate   Final   Sample Source 03/02/2023 URINE   Final   STATUS: 03/02/2023 FINAL   Final   Result: 03/02/2023 Less than 10,000 CFU/mL of single Gram negative organism isolated. No further testing will be performed. If clinically indicated, recollection using a method to minimize contamination, with prompt transfer to Urine Culture Transport Tube, is recommended.   Final  Office Visit on 12/29/2022  Component Date Value Ref Range Status   Hemoglobin A1C 12/29/2022 5.8 (A)  4.0 - 5.6 % Final    Blood Alcohol level:  Lab Results  Component Value Date   ETH <10 06/25/2023   ETH <5 06/18/2015    Metabolic Disorder Labs: Lab Results  Component Value Date   HGBA1C 6.0 (A)  05/01/2023   MPG 102.54 08/18/2020   MPG 117 06/21/2015   Lab Results  Component Value Date   PROLACTIN 5.2 11/28/2019   PROLACTIN 112.5 (H) 06/21/2015   Lab Results  Component Value Date   CHOL 133 08/19/2022   TRIG 230.0 (H) 08/19/2022   HDL 41.60 08/19/2022   CHOLHDL 3 08/19/2022   VLDL 46.0 (H) 08/19/2022   LDLCALC 54 05/15/2017   LDLCALC 75 06/21/2015    Therapeutic Lab Levels: Lab Results  Component Value Date   LITHIUM 0.49 (L) 08/17/2020   LITHIUM 0.65 06/15/2020   Lab Results  Component Value Date   VALPROATE 57.2 02/15/2021   No results found for: "CBMZ"  Physical Findings   AIMS    Flowsheet Row [facility]) [facility]  AIMS Total Score 0      AUDIT    Flowsheet Row Ad[facility]) [facility] Ad[facility] (Discharged) [facility] to Hosp-Admission (Discharged) from [facility]  Alcohol Use Disorder Identification Test Final Score (AUDIT) 0 0 0      GAD-7    Flowsheet Row Office Visit from 10/03/2022 in [facility] [place]  Total GAD-7 Score 3      PHQ2-9    Flowsheet Row  Office Visit from 06/11/2023 in [facility] [place] Clinical Support from 01/14/2023 in [facility] [place] Office Visit from 10/03/2022 in [facility] [place] Office Visit from 07/04/2022 in [facility] [place] Clinical Support from 12/27/2021 in [facility] [place]  PHQ-2 Total Score 2 2 3  0 2  PHQ-9 Total Score 2 2 7  -- 4      Flowsheet Row ED from 06/25/2023 in [facility] ED from 05/18/2023 in [facility] Emergency Department at [facility] ED from 05/14/2023 in [facility]  Emergency Department at [facility]  C-SSRS RISK CATEGORY No Risk No Risk No Risk        Musculoskeletal  Strength & Muscle Tone:  sitting on assessment Gait & Station:  sitting on assessment Patient leans:  sitting on assessment  Psychiatric Specialty Exam  Presentation  General Appearance:  Casual  Eye Contact: Good  Speech: Clear and Coherent; Slow  Speech Volume: Normal  Handedness: Right   Mood and Affect  Mood: -- ("ok")  Affect: Constricted; Flat   Thought Process  Thought Processes: Coherent; Goal Directed; Linear  Descriptions of Associations:Intact  Orientation:Full (Time, Place and Person)  Thought Content:Delusions; Paranoid Ideation  Diagnosis of Schizophrenia or Schizoaffective disorder in past: Yes  Duration of Psychotic Symptoms: Greater than six months   Hallucinations:Hallucinations: None  Ideas of Reference:Delusions; Paranoia  Suicidal Thoughts:Suicidal Thoughts: No  Homicidal Thoughts:Homicidal Thoughts: No   Sensorium  Memory: Immediate Fair  Judgment: Poor  Insight: Poor   Executive Functions  Concentration: Fair  Attention Span: Fair  Recall: Fair  Fund of Knowledge: Fair  Language: Fair   Psychomotor Activity  Psychomotor Activity: Psychomotor Activity: Decreased   Assets  Assets: Communication Skills; Financial Resources/Insurance; Housing; Resilience; Social Support   Sleep  Sleep: Sleep: Poor Number of Hours of Sleep: 4   Nutritional Assessment (For OBS and [facility] admissions only) Has the patient had a weight loss or gain of 10 pounds or more in the last 3 months?: No Has the patient had a decrease in food intake/or appetite?: No Does the patient have dental problems?: No Does the patient have eating habits or behaviors that may be indicators of an eating disorder including binging or inducing vomiting?: No Has the patient recently lost weight without trying?: 0 Has the patient  been eating poorly because of a decreased appetite?: 0 Malnutrition Screening Tool Score: 0    Physical Exam  Physical Exam Constitutional:      General: She is not in acute distress.    Appearance: She is not ill-appearing, toxic-appearing or diaphoretic.  Eyes:     General: No scleral icterus. Cardiovascular:     Rate and Rhythm: Normal rate.  Pulmonary:     Effort: Pulmonary effort is normal. No respiratory distress.  Neurological:     Mental Status: She is alert and oriented to person, place, and time.  Psychiatric:        Attention and Perception: Attention and perception normal.        Mood and Affect: Mood normal. Affect is flat.        Speech: Speech is delayed.        Behavior: Behavior is slowed. Behavior is cooperative.        Thought Content: Thought content is paranoid and delusional. Thought content does not include homicidal or suicidal ideation.    Review of Systems  Constitutional:  Negative for chills and fever.  Respiratory:  Negative for shortness of breath.   Cardiovascular:  Negative for chest pain and palpitations.  Gastrointestinal:  Negative for abdominal pain.  Neurological:  Negative for headaches.  Psychiatric/Behavioral:  The patient has insomnia.   +paranoia + delusions Blood pressure 132/68, pulse 82, temperature 97.9 F (36.6 C), temperature source Oral, resp. rate 20, SpO2 98%. There is no height or weight on file to calculate BMI.  Treatment Plan Summary: Daily contact with patient to assess and evaluate symptoms and progress in treatment, Medication management, and Plan    62 y/o female w/ reported history of schizoaffective disorder, bipolar type. On assessment, presents with paranoia, delusions. Per collateral, has been worsening for the past week. Home medications re-started. Pt is recommended for inpatient psychiatric admission.  Lauree Chandler, NP 06/25/2023 10:20 AM

## 2023-06-25 NOTE — ED Notes (Signed)
Pt discharged to [facility] via [facility]. Pt alert, oriented, and ambulatory.  Safety maintained.Patient Is ivc'd. Patient states not to tell her wife she is leaving. States she will call her one she gets settled there.

## 2023-06-25 NOTE — Plan of Care (Signed)
  Problem: Education: Goal: Knowledge of General Education information will improve Description: Including pain rating scale, medication(s)/side effects and non-pharmacologic comfort measures Outcome: Not Progressing   Problem: Health Behavior/Discharge Planning: Goal: Ability to manage health-related needs will improve Outcome: Not Progressing   Problem: Coping: Goal: Level of anxiety will decrease Outcome: Not Progressing   Problem: Elimination: Goal: Will not experience complications related to bowel motility Outcome: Not Progressing Goal: Will not experience complications related to urinary retention Outcome: Not Progressing

## 2023-06-25 NOTE — Progress Notes (Signed)
   06/25/23 0527  [facility] Triage Screening (Walk-ins at [facility] only)  How Did You Hear About [place]? Family/Friend  What Is the Reason for Your Visit/Call Today? Pt presents to [facility], accompanied by her wife with complaint of lack of sleep and delusional behaviors. Pt reports that she has not been sleeping due to watching the recent election coverage. Per wife, pt locked herself in the bathroom today and believed that she was texting [name]. She reports that pt became fixated with election results. Per wife, pt has donated at least $1000 toward the [employer] over the course of election. Pt wife has had to confiscate her cell phone to minimize usage and for pt to rest. Pt believes that election is not over and that she has [name] as a contact in her phone. Pt has history of Bipolar, Schizoaffective Disorder and mild cognitive impairment. Per wife, pt had an episode in 2015 that began with pt not sleeping and resulted in numerous EMS calls and testings because pt believes that she is not well. Pt currently denies SI,HI,AVH and substance/alcohol use.  How Long Has This Been Causing You Problems? <Week  Have You Recently Had Any Thoughts About Hurting Yourself? No  Are You Planning to Commit Suicide/Harm Yourself At This time? No  Have you Recently Had Thoughts About Hurting Someone Karolee Ohs? No  Are you currently experiencing any auditory, visual or other hallucinations? No  Have You Used Any Alcohol or Drugs in the Past 24 Hours? No  Do you have any current medical co-morbidities that require immediate attention? No  Clinician description of patient physical appearance/behavior: pt is calm, cooperative, casually dressed  What Do You Feel Would Help You the Most Today? Treatment for Depression or other mood problem  If access to [facility] was not available, would you have sought care in the Emergency Department? Yes  Determination of Need Urgent (48 hours)  Options For Referral Other:  Comment;BH Urgent Care;Outpatient Therapy;Medication Management;Inpatient Hospitalization

## 2023-06-25 NOTE — ED Notes (Signed)
Rn left voice mail for sheriff about transporting patient to [place]. Waiting on the sheriff to call back

## 2023-06-25 NOTE — Progress Notes (Signed)
Patient is a 62 year old female admitted involuntarily to the [facility] Psych floor from [facility] with complaints of delusional thinking, lack of sleep, and reports of behavior spiraling out of control since the candidate she supported did not win the presidential election. Patient reportedly donated over $1,000 to campaign funding and this has caused financial stress on the household. Patient presents to assessment ambulatory. She is A+O x 4. She currently denies SI/HI/AVH. Patient's affect is appropriate and speech is logical and coherent. Patient endorses depression and anxiety 8/10. She denies pain. Patient uses a cane at home and ambulates with care. Reports the use of reading glasses (at home). Reports last BM on Jun 24, 2023.  Patient denies smoking cigs and drinking alcohol.  Patient says that her support system is her sister. Her goal while she is here is "to write a book."   Skin assessment and body search completed with [facility], MHT. Skin: warm/dry. R lateral abd bruise, mole under R breast, and L hand scratches. No contrabands found.   Emotional support and reassurance provided throughout admission intake. Consents signed. Afterwards, oriented patient to unit, room and call light, reviewed POC with all questions answered and concerns voiced. Patient verbalized understanding. Denies any needs at this time.  Will continue to monitor with ongoing Q 15 minute safety checks.

## 2023-06-26 DIAGNOSIS — F3130 Bipolar disorder, current episode depressed, mild or moderate severity, unspecified: Secondary | ICD-10-CM

## 2023-06-26 MED ORDER — BUPROPION HCL ER (XL) 150 MG PO TB24
150.0000 mg | ORAL_TABLET | Freq: Every day | ORAL | Status: DC
  Administered 2023-06-26 – 2023-07-02 (×7): 150 mg via ORAL
  Filled 2023-06-26 (×7): qty 1

## 2023-06-26 MED ORDER — RISPERIDONE 1 MG PO TABS
0.5000 mg | ORAL_TABLET | ORAL | Status: DC
  Administered 2023-06-26 – 2023-06-27 (×3): 0.5 mg via ORAL
  Filled 2023-06-26 (×3): qty 1

## 2023-06-26 MED ORDER — FLUOXETINE HCL 20 MG PO CAPS
20.0000 mg | ORAL_CAPSULE | Freq: Every day | ORAL | Status: DC
  Administered 2023-06-26 – 2023-07-02 (×7): 20 mg via ORAL
  Filled 2023-06-26 (×7): qty 1

## 2023-06-26 NOTE — Evaluation (Cosign Needed)
Physical Therapy Evaluation Patient Details Name: [name] MRN: [id number] DOB: August 18, 1961 Today's Date: 06/26/2023  History of Present Illness  Patient is a 62 year old female who was involuntarily admitted to inpatient psychiatry for depression. Current MD assessment: depression and anxiety.  Clinical Impression  Pt was pleasant and motivated to participate during the session and put forth good effort throughout. Pt found lying in bed, easily wakes up with light tactile stimulation. She is able to perform bed mobility Mod I, and transfers with Supervision using RW. Pt performed amb with RW and CGA for ~200 feet. Overall steady with steps but towards end of bout pt reports getting tired, noting R hip lateral shift during stance phase on RLE. Pt able to reach outside BOS bilaterally while standing to grab items from snack cart with single UE stabilization on RW. Pt left seated in day room with snacks. Pt will benefit from continued PT services upon discharge to safely address deficits listed in patient problem list for decreased caregiver assistance and eventual return to PLOF.       If plan is discharge home, recommend the following: A little help with walking and/or transfers;A little help with bathing/dressing/bathroom;Assist for transportation;Help with stairs or ramp for entrance;Assistance with cooking/housework   Can travel by private Tax inspector (2 wheels)  Recommendations for Other Services       Functional Status Assessment Patient has had a recent decline in their functional status and demonstrates the ability to make significant improvements in function in a reasonable and predictable amount of time.     Precautions / Restrictions Precautions Precautions: Fall Restrictions Weight Bearing Restrictions: No      Mobility  Bed Mobility Overal bed mobility: Modified Independent             General bed mobility  comments: increased time    Transfers Overall transfer level: Needs assistance Equipment used: Rolling walker (2 wheels) Transfers: Sit to/from Stand Sit to Stand: Supervision           General transfer comment: no physical asssit needed, she displays good forwards weight shift and foot placement.    Ambulation/Gait   Gait Distance (Feet): 200 Feet Assistive device: Rolling walker (2 wheels)   Gait velocity: decreased     General Gait Details: Pt able to perform bout with steady steps throughout with no LOB and good RW management;  but extended distances causing pt LE's fatiguing resulting in R hip lateral shift during stance phase on RLE  Stairs            Wheelchair Mobility     Tilt Bed    Modified Rankin (Stroke Patients Only)       Balance Overall balance assessment: Needs assistance Sitting-balance support: Feet supported Sitting balance-Leahy Scale: Good       Standing balance-Leahy Scale: Good Standing balance comment: good balance, able to reach slightly outside BOS when standing                             Pertinent Vitals/Pain Pain Assessment Pain Assessment: No/denies pain    Home Living Family/patient expects to be discharged to:: Private residence Living Arrangements: Spouse/significant other;Children Available Help at Discharge: Family;Available 24 hours/day Type of Home: House Home Access: Stairs to enter Entrance Stairs-Rails: None Entrance Stairs-Number of Steps: 5   Home Layout: One level        Prior Function  Prior Level of Function : Independent/Modified Independent;History of Falls (last six months)             Mobility Comments: Community amb as baseline, use of SPC when in community. Endorses several falls where pt becomes weak or loses her balance. Wife drives ADLs Comments: Ind with showering and bathroom use     Extremity/Trunk Assessment   Upper Extremity Assessment Upper Extremity Assessment:  Overall WFL for tasks assessed    Lower Extremity Assessment Lower Extremity Assessment: Generalized weakness       Communication   Communication Communication: No apparent difficulties  Cognition Arousal: Alert Behavior During Therapy: WFL for tasks assessed/performed Overall Cognitive Status: Within Functional Limits for tasks assessed                                          General Comments      Exercises     Assessment/Plan    PT Assessment Patient needs continued PT services  PT Problem List Decreased strength;Decreased coordination;Decreased range of motion;Decreased activity tolerance;Decreased balance;Decreased mobility       PT Treatment Interventions DME instruction;Balance training;Stair training;Gait training;Functional mobility training;Therapeutic activities;Therapeutic exercise    PT Goals (Current goals can be found in the Care Plan section)  Acute Rehab PT Goals Patient Stated Goal: get stronger, drive again PT Goal Formulation: With patient Time For Goal Achievement: 07/09/23 Potential to Achieve Goals: Fair    Frequency Min 1X/week     Co-evaluation               AM-PAC PT "6 Clicks" Mobility  Outcome Measure Help needed turning from your back to your side while in a flat bed without using bedrails?: None Help needed moving from lying on your back to sitting on the side of a flat bed without using bedrails?: None Help needed moving to and from a bed to a chair (including a wheelchair)?: A Little Help needed standing up from a chair using your arms (e.g., wheelchair or bedside chair)?: A Little Help needed to walk in hospital room?: A Little Help needed climbing 3-5 steps with a railing? : A Little 6 Click Score: 20    End of Session Equipment Utilized During Treatment: Gait belt Activity Tolerance: Patient tolerated treatment well Patient left: in chair (left in day room with snacks) Nurse Communication: Mobility  status PT Visit Diagnosis: Muscle weakness (generalized) (M62.81);Other abnormalities of gait and mobility (R26.89);Difficulty in walking, not elsewhere classified (R26.2)    Time: 4098-1191 PT Time Calculation (min) (ACUTE ONLY): 23 min   Charges:                 Cecile Sheerer, SPT 06/26/23, 4:37 PM

## 2023-06-26 NOTE — H&P (Signed)
Psychiatric Admission Assessment Adult  Patient Identification: [name] MRN:  [id number] Date of Evaluation:  06/26/2023 Chief Complaint:  Schizoaffective disorder (HCC) [F25.9] Principal Diagnosis: <principal problem not specified> Diagnosis:  Active Problems:   Schizoaffective disorder (HCC)  History of Present Illness: [name] is a 62 year old white female who was involuntarily admitted to inpatient psychiatry for depression.  She sees Dr. Donell Beers who has her diagnosed with unspecified psychosis.  She tells me that she was upset over the election and has not been sleeping.  She denies being suicidal or homicidal.  She lives with her wife and her son.  She smiles throughout the interview but states that she is depressed.  She tells me that she has had multiple admissions to psychiatry since the age of 67.  She comes across somewhat cluster B.  She takes Depakote at night for sleeping and for what she calls bipolar disorder.  She comes across a little bit paranoid and delusional but not a threat to herself or others.  Not sure how she got committed.  She states that her wife wanted her to go into the hospital to get some sleep.  She is on Auvelity, which is a new medication that has Wellbutrin in dextromethorphan.  I told her that we do not have it but I can put her back on Wellbutrin.  She states that her medications are not working anyway.  Associated Signs/Symptoms: Depression Symptoms:  depressed mood, insomnia, anxiety, (Hypo) Manic Symptoms:  Delusions, Flight of Ideas, Anxiety Symptoms:  Excessive Worry, Psychotic Symptoms:  Delusions, PTSD Symptoms: NA Total Time spent with patient: 1 hour  Past Psychiatric History: Extensive history of depression, cluster B traits, and bipolar disorder  Is the patient at risk to self? Yes.    Has the patient been a risk to self in the past 6 months? Yes.    Has the patient been a risk to self within the distant past? Yes.    Is the patient  a risk to others? No.  Has the patient been a risk to others in the past 6 months? No.  Has the patient been a risk to others within the distant past? No.   [place] Scale:  Flowsheet Row Admission (Current) from 06/25/2023 in [facility] BE[facility]IORAL [facility]IC[facility] Most recent reading at 06/25/2023  7:00 PM ED from 06/25/2023 in [facility] Most recent reading at 06/25/2023  6:55 AM ED from 05/18/2023 in [facility] Emergency Department at [facility] Most recent reading at 05/18/2023  6:50 PM  C-SSRS RISK CATEGORY No Risk No Risk No Risk        Prior Inpatient Therapy: Yes.   If yes, describe she was hospitalized at [facility] at age 37 and other places since including [facility].  Also [facility]. Prior Outpatient Therapy: Yes.   If yes, describe Dr. Donell Beers  Alcohol Screening: 1. How often do you have a drink containing alcohol?: Monthly or less 2. How many drinks containing alcohol do you have on a typical day when you are drinking?: 1 or 2 3. How often do you have six or more drinks on one occasion?: Never AUDIT-C Score: 1 4. How often during the last year have you found that you were not able to stop drinking once you had started?: Never 5. How often during the last year have you failed to do what was normally expected from you because of drinking?: Never 6. How often during the last year have you needed a first drink  in the morning to get yourself going after a heavy drinking session?: Never 7. How often during the last year have you had a feeling of guilt of remorse after drinking?: Never 8. How often during the last year have you been unable to remember what happened the night before because you had been drinking?: Never 9. Have you or someone else been injured as a result of your drinking?: No 10. Has a relative or friend or a doctor or another health worker been concerned about your drinking or suggested you cut down?: No Alcohol Use Disorder Identification Test  Final Score (AUDIT): 1 Substance Abuse History in the last 12 months:  No. Consequences of Substance Abuse: NA Previous Psychotropic Medications: Yes  Psychological Evaluations: Yes  Past Medical History:  Past Medical History:  Diagnosis Date   Abnormal LFTs 04/06/2013   Very minor tried to reassure okay to repeat today with hepatitis C screen   Allergic rhinitis 03/26/2007   Autonomic dysfunction 01/11/2015   Bipolar 1 disorder    Hx of psychosis; present since age 65   Chest pain on breathing 03/16/2014   Chronic fatigue 06/21/2017   Chronic interstitial cystitis 11/15/2020   Community acquired pneumonia of right lung 08/17/2020   Complication of anesthesia    agitation   Constipation 05/17/2012   Costochondritis 02/14/2014   Diverticular disease of colon 08/06/2020   Double vision 03/15/2011   Dyspnea on effort 01/10/2011   Dysuria 02/15/2013   Early satiety 04/27/2013   Elevated lactic acid level    Endometriosis 05/11/2008   Fatty liver disease, nonalcoholic 02/13/2014   Fecal urgency 08/06/2020   Female stress incontinence 11/15/2020   Food aversion 11/08/2012   Frequency of urination    Gastroesophageal reflux disease 08/06/2020   Headache    History of duodenal ulcer    History of hysterectomy 11/15/2020   History of kidney stones    History of lithium toxicity 05/01/2014   Hoarseness of voice 02/27/2016   Hyperglycemia 01/10/2011   Hyperlipidemia, mixed 05/12/2017   Hyperprolactinemia 06/26/2015   Insomnia 05/12/2017   Irritable bowel syndrome 08/06/2020   Jaw pain 02/13/2012   poss tmj area     Left upper quadrant pain 08/06/2020   Leukocytosis 05/01/2014   Lump of breast, left 12/20/2012   about 6 oclock    Mass of urethra 11/15/2020   Mild cognitive impairment of uncertain or unknown etiology 10/16/2020   Multinodular goiter 05/12/2017   Nocturia    Nondiabetic gastroparesis 01/11/2015   Noninvasive follicular neoplasm of thyroid with  papillary-like nuclear features 10/10/2021   Palpitations 12/08/2010   Pelvic pain    Periumbilical pain 08/06/2020   Plantar fasciitis 06/11/2007   Postsurgical hypothyroidism 09/03/2021   Pruritus ani 08/06/2020   Right ear pain 02/16/2012   Social anxiety disorder    Subacute bronchitis 07/10/2018   Type II diabetes mellitus 10/10/2021   Unstable gait 07/05/2022   Urgency of urination    Vitamin B12 deficiency 09/03/2021   Vitamin D deficiency 09/23/2011    Past Surgical History:  Procedure Laterality Date   CARDIAC CATHETERIZATION  10-09-1999  DR KATZ   NORMAL LVF/  NORMAL RCA/  NO CRITICAL DISEASE LEFT CORONARY SYSTEM   CARDIOVASCULAR STRESS TEST  01-23-2011   NORMAL NUCLEAR STUDY/ NO ISCHEMIA/ EF 81%   CYSTOSCOPY WITH BIOPSY N/A 06/03/2013   Procedure: CYSTOSCOPY WITH BIOPSY  INSTILLATION OF MARCAINE AND PYRIDIUM;  Surgeon: Lindaann Slough, MD;  Location: [facility];  Service: Urology;  Laterality: N/A;   LAPAROSCOPIC CHOLECYSTECTOMY  11-08-2001   LAPAROSCOPIC LEFT SALPINGOOPHORECTOMY AND LYSYS ADHESIONS  03-10-2002   LAPAROSCOPIC REMOVAL OVARY REMNANT  2003   [place]   LAPAROSCOPY N/A 12/14/2012   Procedure: LAPAROSCOPY DIAGNOSTIC;  Surgeon: Almond Lint, MD;  Location: [facility];  Service: General;  Laterality: N/A;   THYROIDECTOMY N/A 05/24/2021   Procedure: TOTAL THYROIDECTOMY;  Surgeon: Darnell Level, MD;  Location: [facility];  Service: General;  Laterality: N/A;   TOTAL ABDOMINAL HYSTERECTOMY  2000   W/ RIGHT SALPINGOOPHORECTOMY   TRANSTHORACIC ECHOCARDIOGRAM  10-13-2010   NORMAL LVF/  EF 55-60%   Family History:  Family History  Problem Relation Age of Onset   Other Mother        MAC infection   Anxiety disorder Mother    Dementia Mother        likely Alzheimer's disease; symptom onset on late 70s/early 80s   Sleep apnea Father    Depression Father    Alcohol abuse Father    Hypertension Father    Migraines Sister        headaches   Anxiety  disorder Sister    Depression Sister    Heart disease Paternal Grandmother    Hyperlipidemia Paternal Grandmother    Alcohol abuse Paternal Grandfather    Diabetes Neg Hx    Family Psychiatric  History: Unremarkable Tobacco Screening:  Social History   Tobacco Use  Smoking Status Former   Current packs/day: 0.00   Types: Cigarettes   Start date: 1979   Quit date: 1980   Years since quitting: 44.8  Smokeless Tobacco Never  Tobacco Comments   ONLY SMOKED FOR 6 MONTHS --  QUIT  YRS AGO    [facility] Tobacco Counseling     Are you interested in Tobacco Cessation Medications?  N/A, patient does not use tobacco products Counseled patient on smoking cessation:  N/A, patient does not use tobacco products Reason Tobacco Screening Not Completed: No value filed.       Social History:  Social History   Substance and Sexual Activity  Alcohol Use Not Currently   Comment: occ     Social History   Substance and Sexual Activity  Drug Use Never    Additional Social History:                           Allergies:   Allergies  Allergen Reactions   Morphine And Codeine Nausea And Vomiting and Nausea Only   Ambien [Zolpidem] Other (See Comments)    Per patient this caused her to fall   Duloxetine Swelling    Edema   Latuda [Lurasidone Hcl]    Morphine Nausea Only   Shingrix [Zoster Vac Recomb Adjuvanted]     Dizziness vomiting   Lab Results:  Results for orders placed or performed during the hospital encounter of 06/25/23 (from the past 48 hour(s))  CBC with Differential/Platelet     Status: None   Collection Time: 06/25/23  6:46 AM  Result Value Ref Range   WBC 8.6 4.0 - 10.5 K/uL   RBC 4.50 3.87 - 5.11 MIL/uL   Hemoglobin 14.0 12.0 - 15.0 g/dL   HCT 82.9 56.2 - 13.0 %   MCV 96.9 80.0 - 100.0 fL   MCH 31.1 26.0 - 34.0 pg   MCHC 32.1 30.0 - 36.0 g/dL   RDW 86.5 78.4 - 69.6 %   Platelets 284 150 - 400 K/uL  nRBC 0.0 0.0 - 0.2 %   Neutrophils Relative % 61 %    Neutro Abs 5.3 1.7 - 7.7 K/uL   Lymphocytes Relative 25 %   Lymphs Abs 2.2 0.7 - 4.0 K/uL   Monocytes Relative 9 %   Monocytes Absolute 0.8 0.1 - 1.0 K/uL   Eosinophils Relative 4 %   Eosinophils Absolute 0.3 0.0 - 0.5 K/uL   Basophils Relative 1 %   Basophils Absolute 0.1 0.0 - 0.1 K/uL   Immature Granulocytes 0 %   Abs Immature Granulocytes 0.03 0.00 - 0.07 K/uL    Comment: Performed at [facility], [address], [place], [place] [address]  Comprehensive metabolic panel     Status: Abnormal   Collection Time: 06/25/23  6:46 AM  Result Value Ref Range   Sodium 137 135 - 145 mmol/L   Potassium 4.1 3.5 - 5.1 mmol/L   Chloride 99 98 - 111 mmol/L   CO2 24 22 - 32 mmol/L   Glucose, Bld 125 (H) 70 - 99 mg/dL    Comment: Glucose reference range applies only to samples taken after fasting for at least 8 hours.   BUN 15 8 - 23 mg/dL   Creatinine, Ser 2.95 0.44 - 1.00 mg/dL   Calcium 9.8 8.9 - 62.1 mg/dL   Total Protein 8.1 6.5 - 8.1 g/dL   Albumin 3.6 3.5 - 5.0 g/dL   AST 26 15 - 41 U/L   ALT 28 0 - 44 U/L   Alkaline Phosphatase 68 38 - 126 U/L   Total Bilirubin 0.8 <1.2 mg/dL   GFR, Estimated >30 >86 mL/min    Comment: (NOTE) Calculated using the CKD-EPI Creatinine Equation (2021)    Anion gap 14 5 - 15    Comment: Performed at [facility], [address] [place], [place] [address]  Ethanol     Status: None   Collection Time: 06/25/23  6:46 AM  Result Value Ref Range   Alcohol, Ethyl (B) <10 <10 mg/dL    Comment: (NOTE) Lowest detectable limit for serum alcohol is 10 mg/dL.  For medical purposes only. Performed at [facility], [address] [place], [place] [address]   TSH     Status: Abnormal   Collection Time: 06/25/23  6:46 AM  Result Value Ref Range   TSH 6.382 (H) 0.350 - 4.500 uIU/mL    Comment: Performed by a 3rd Generation assay with a functional sensitivity of <=0.01 uIU/mL. Performed at [facility], [address],  [place], [place] [address]   POCT Urine Drug Screen - (I-Screen)     Status: Abnormal   Collection Time: 06/25/23  6:48 AM  Result Value Ref Range   POC Amphetamine UR None Detected NONE DETECTED (Cut Off Level 1000 ng/mL)   POC Secobarbital (BAR) None Detected NONE DETECTED (Cut Off Level 300 ng/mL)   POC Buprenorphine (BUP) None Detected NONE DETECTED (Cut Off Level 10 ng/mL)   POC Oxazepam (BZO) Positive (A) NONE DETECTED (Cut Off Level 300 ng/mL)   POC Cocaine UR None Detected NONE DETECTED (Cut Off Level 300 ng/mL)   POC Methamphetamine UR None Detected NONE DETECTED (Cut Off Level 1000 ng/mL)   POC Morphine None Detected NONE DETECTED (Cut Off Level 300 ng/mL)   POC Methadone UR None Detected NONE DETECTED (Cut Off Level 300 ng/mL)   POC Oxycodone UR None Detected NONE DETECTED (Cut Off Level 100 ng/mL)   POC Marijuana UR None  Detected NONE DETECTED (Cut Off Level 50 ng/mL)  POC urine preg, ED     Status: Normal   Collection Time: 06/25/23  6:49 AM  Result Value Ref Range   Preg Test, Ur Negative Negative  SARS Coronavirus 2 by RT PCR (hospital order, performed in [facility] hospital lab) *cepheid single result test* Anterior Nasal Swab     Status: None   Collection Time: 06/25/23 11:42 AM   Specimen: Anterior Nasal Swab  Result Value Ref Range   SARS Coronavirus 2 by RT PCR NEGATIVE NEGATIVE    Comment: Performed at [facility], [address], [place], [place] [address]    Blood Alcohol level:  Lab Results  Component Value Date   [facility] <10 06/25/2023   ETH <5 06/18/2015    Metabolic Disorder Labs:  Lab Results  Component Value Date   HGBA1C 6.0 (A) 05/01/2023   MPG 102.54 08/18/2020   MPG 117 06/21/2015   Lab Results  Component Value Date   PROLACTIN 5.2 11/28/2019   PROLACTIN 112.5 (H) 06/21/2015   Lab Results  Component Value Date   CHOL 133 08/19/2022   TRIG 230.0 (H) 08/19/2022   HDL 41.60 08/19/2022   CHOLHDL 3 08/19/2022   VLDL 46.0 (H) 08/19/2022    LDLCALC 54 05/15/2017   LDLCALC 75 06/21/2015    Current Medications: Current Facility-Administered Medications  Medication Dose Route Frequency Provider Last Rate Last Admin   acetaminophen (TYLENOL) tablet 650 mg  650 mg Oral Q6H PRN Lauree Chandler, NP       albuterol (VENTOLIN HFA) 108 (90 Base) MCG/ACT inhaler 2 puff  2 puff Inhalation Q6H PRN Lauree Chandler, NP       alum & mag hydroxide-simeth (MAALOX/MYLANTA) 200-200-20 MG/5ML suspension 30 mL  30 mL Oral Q4H PRN Lauree Chandler, NP       benzonatate (TESSALON) capsule 200 mg  200 mg Oral BID PRN Sarina Ill, DO   200 mg at 06/26/23 1001   buPROPion (WELLBUTRIN XL) 24 hr tablet 150 mg  150 mg Oral Daily Sarina Ill, DO       diphenhydrAMINE (BENADRYL) capsule 50 mg  50 mg Oral TID PRN Lauree Chandler, NP       Or   diphenhydrAMINE (BENADRYL) injection 50 mg  50 mg Intramuscular TID PRN Lauree Chandler, NP       divalproex (DEPAKOTE ER) 24 hr tablet 750 mg  750 mg Oral QHS Lauree Chandler, NP   750 mg at 06/25/23 2118   FLUoxetine (PROZAC) capsule 20 mg  20 mg Oral Daily Sarina Ill, DO       guaiFENesin [facility]) 12 hr tablet 600 mg  600 mg Oral BID Sarina Ill, DO   600 mg at 06/26/23 1001   haloperidol (HALDOL) tablet 5 mg  5 mg Oral TID PRN Lauree Chandler, NP       Or   haloperidol lactate (HALDOL) injection 5 mg  5 mg Intramuscular TID PRN Lauree Chandler, NP       hydrOXYzine (ATARAX) tablet 25 mg  25 mg Oral TID PRN Lauree Chandler, NP   25 mg at 06/25/23 2117   levothyroxine (SYNTHROID) tablet 100 mcg  100 mcg Oral Q0600 Lauree Chandler, NP   100 mcg at 06/26/23 1308   LORazepam (ATIVAN) tablet 2 mg  2 mg Oral TID PRN Lauree Chandler, NP       Or  LORazepam (ATIVAN) injection 2 mg  2 mg Intramuscular TID PRN Lauree Chandler, NP       magnesium hydroxide (MILK OF MAGNESIA) suspension 30 mL  30 mL Oral Daily PRN Lauree Chandler, NP       pravastatin (PRAVACHOL) tablet 20 mg  20 mg Oral QHS Lauree Chandler, NP       risperiDONE (RISPERDAL) tablet 0.5 mg  0.5 mg Oral [id number] Sarina Ill, DO       traZODone (DESYREL) tablet 50 mg  50 mg Oral QHS PRN Lauree Chandler, NP   50 mg at 06/25/23 2117   PTA Medications: Medications Prior to Admission  Medication Sig Dispense Refill Last Dose   albuterol (VENTOLIN HFA) 108 (90 Base) MCG/ACT inhaler Inhale 2 puffs into the lungs every 6 (six) hours as needed for wheezing or shortness of breath. 18 g 1    aspirin (ASPIRIN CHILDRENS) 81 MG chewable tablet Chew 1 tablet (81 mg total) by mouth daily.      aspirin-acetaminophen-caffeine (EXCEDRIN MIGRAINE) 250-250-65 MG tablet Take 2 tablets by mouth every 6 (six) hours as needed for headache.      AUVELITY 45-105 MG TBCR Take 1 tablet by mouth every morning.      benzonatate (TESSALON) 200 MG capsule Take 1 capsule (200 mg total) by mouth 2 (two) times daily as needed for cough. 20 capsule 0    divalproex (DEPAKOTE ER) 250 MG 24 hr tablet Take 750 mg by mouth at bedtime.      levothyroxine (SYNTHROID) 100 MCG tablet Take 1 tablet (100 mcg total) by mouth daily. 90 tablet 3    meclizine (ANTIVERT) 25 MG tablet Take 1 tablet (25 mg total) by mouth 3 (three) times daily as needed for dizziness. 30 tablet 0    metoprolol tartrate (LOPRESSOR) 25 MG tablet Take 1 tablet (25 mg total) by mouth as directed for 3 doses. Take 1 tablet twice daily 1 day before CT scan and 1 tablet before CT scan 3 tablet 0    Multiple Vitamin (MULTIVITAMIN WITH MINERALS) TABS tablet Take 1 tablet by mouth daily.      omeprazole (PRILOSEC) 40 MG capsule Take 1 capsule (40 mg total) by mouth daily. 60 capsule 0    ondansetron (ZOFRAN) 4 MG tablet Take 1 tablet (4 mg total) by mouth every 8 (eight) hours as needed for nausea or vomiting. (Patient not taking: Reported on 06/25/2023) 20 tablet 0    pravastatin (PRAVACHOL) 20 MG tablet Take 1  tablet by mouth once daily 90 tablet 1    terbinafine (LAMISIL) 250 MG tablet Take 1 tablet (250 mg total) by mouth daily. 90 tablet 0     Musculoskeletal: Strength & Muscle Tone: within normal limits Gait & Station: normal Patient leans: N/A            Psychiatric Specialty Exam:  Presentation  General Appearance:  Casual  Eye Contact: Good  Speech: Clear and Coherent; Slow  Speech Volume: Normal  Handedness: Right   Mood and Affect  Mood: -- ("ok")  Affect: Constricted; Flat   Thought Process  Thought Processes: Coherent; Goal Directed; Linear  Duration of Psychotic Symptoms:N/A Past Diagnosis of Schizophrenia or Psychoactive disorder: Yes  Descriptions of Associations:Intact  Orientation:Full (Time, Place and Person)  Thought Content:Delusions; Paranoid Ideation  Hallucinations:Hallucinations: None  Ideas of Reference:Delusions; Paranoia  Suicidal Thoughts:Suicidal Thoughts: No  Homicidal Thoughts:Homicidal Thoughts: No   Sensorium  Memory: Immediate Fair  Judgment: Poor  Insight: Poor   Art therapist  Concentration: Fair  Attention Span: Fair  Recall: [employer] of Knowledge: Fair  Language: Fair   Psychomotor Activity  Psychomotor Activity: Psychomotor Activity: Decreased   Assets  Assets: Communication Skills; Financial Resources/Insurance; Housing; Resilience; Social Support   Sleep  Sleep: Sleep: Poor Number of Hours of Sleep: 4    Physical Exam: Physical Exam Constitutional:      Appearance: Normal appearance.  HENT:     Head: Normocephalic and atraumatic.     Mouth/Throat:     Pharynx: Oropharynx is clear.  Eyes:     Pupils: Pupils are equal, round, and reactive to light.  Cardiovascular:     Rate and Rhythm: Normal rate and regular rhythm.  Pulmonary:     Effort: Pulmonary effort is normal.     Breath sounds: Normal breath sounds.  Abdominal:     General: Abdomen is flat.      Palpations: Abdomen is soft.  Musculoskeletal:        General: Normal range of motion.  Skin:    General: Skin is warm and dry.  Neurological:     General: No focal deficit present.     Mental Status: She is alert. Mental status is at baseline.  Psychiatric:        Attention and Perception: Attention and perception normal.        Mood and Affect: Mood is anxious and depressed. Affect is flat.        Speech: Speech normal.        Behavior: Behavior is cooperative.        Thought Content: Thought content normal.        Cognition and Memory: Cognition and memory normal.        Judgment: Judgment normal.    Review of Systems  Constitutional: Negative.   HENT: Negative.    Eyes: Negative.   Respiratory: Negative.    Cardiovascular: Negative.   Gastrointestinal: Negative.   Genitourinary: Negative.   Musculoskeletal: Negative.   Skin: Negative.   Neurological: Negative.   Endo/Heme/Allergies: Negative.   Psychiatric/Behavioral:  Positive for depression. The patient has insomnia.    Blood pressure (!) 99/53, pulse 89, temperature 97.9 F (36.6 C), temperature source Oral, resp. rate 18, height 5' (1.524 m), weight 63.5 kg, SpO2 100%. Body mass index is 27.34 kg/m.  Treatment Plan Summary: Daily contact with patient to assess and evaluate symptoms and progress in treatment, Medication management, and Plan PT evaluation.  Restart Depakote and Wellbutrin.  Start Prozac, Risperdal, trazodone.  Observation Level/Precautions:  15 minute checks  Laboratory:  CBC Chemistry Profile  Psychotherapy:    Medications:    Consultations:    Discharge Concerns:    Estimated LOS:  Other:     Physician Treatment Plan for Primary Diagnosis: <principal problem not specified> Long Term Goal(s): Improvement in symptoms so as ready for discharge  Short Term Goals: Ability to identify changes in lifestyle to reduce recurrence of condition will improve, Ability to verbalize feelings will  improve, Ability to disclose and discuss suicidal ideas, Ability to demonstrate self-control will improve, Ability to identify and develop effective coping behaviors will improve, Ability to maintain clinical measurements within normal limits will improve, Compliance with prescribed medications will improve, and Ability to identify triggers associated with substance abuse/mental health issues will improve  Physician Treatment Plan for Secondary Diagnosis: Active Problems:   Schizoaffective disorder (HCC)    I certify that inpatient services furnished can reasonably be  expected to improve the patient's condition.    Chantella Creech Tresea Mall, DO 11/8/202411:50 AM

## 2023-06-26 NOTE — Group Note (Signed)
Date:  06/26/2023 Time:  1:50 PM  Group Topic/Focus:  Managing Feelings:   The focus of this group is to identify what feelings patients have difficulty handling and develop a plan to handle them in a healthier way upon discharge.    Participation Level:  Did Not Attend    Rodena Goldmann 06/26/2023, 1:50 PM

## 2023-06-26 NOTE — BH IP Treatment Plan (Signed)
Interdisciplinary Treatment and Diagnostic Plan Update  06/26/2023 Time of Session: 10:08 AM  [name] MRN: [id number]  Principal Diagnosis: <principal problem not specified>  Secondary Diagnoses: Active Problems:   Schizoaffective disorder (HCC)   Current Medications:  Current Facility-Administered Medications  Medication Dose Route Frequency Provider Last Rate Last Admin   acetaminophen (TYLENOL) tablet 650 mg  650 mg Oral Q6H PRN Lauree Chandler, NP       albuterol (VENTOLIN HFA) 108 (90 Base) MCG/ACT inhaler 2 puff  2 puff Inhalation Q6H PRN Lauree Chandler, NP       alum & mag hydroxide-simeth (MAALOX/MYLANTA) 200-200-20 MG/5ML suspension 30 mL  30 mL Oral Q4H PRN Lauree Chandler, NP       benzonatate (TESSALON) capsule 200 mg  200 mg Oral BID PRN Sarina Ill, DO   200 mg at 06/26/23 1001   diphenhydrAMINE (BENADRYL) capsule 50 mg  50 mg Oral TID PRN Lauree Chandler, NP       Or   diphenhydrAMINE (BENADRYL) injection 50 mg  50 mg Intramuscular TID PRN Lauree Chandler, NP       divalproex (DEPAKOTE ER) 24 hr tablet 750 mg  750 mg Oral QHS Lauree Chandler, NP   750 mg at 06/25/23 2118   guaiFENesin (MUCINEX) 12 hr tablet 600 mg  600 mg Oral BID Sarina Ill, DO   600 mg at 06/26/23 1001   haloperidol (HALDOL) tablet 5 mg  5 mg Oral TID PRN Lauree Chandler, NP       Or   haloperidol lactate (HALDOL) injection 5 mg  5 mg Intramuscular TID PRN Lauree Chandler, NP       hydrOXYzine (ATARAX) tablet 25 mg  25 mg Oral TID PRN Lauree Chandler, NP   25 mg at 06/25/23 2117   levothyroxine (SYNTHROID) tablet 100 mcg  100 mcg Oral Q0600 Lauree Chandler, NP   100 mcg at 06/26/23 6283   LORazepam (ATIVAN) tablet 2 mg  2 mg Oral TID PRN Lauree Chandler, NP       Or   LORazepam (ATIVAN) injection 2 mg  2 mg Intramuscular TID PRN Lauree Chandler, NP       magnesium hydroxide (MILK OF MAGNESIA) suspension 30 mL  30 mL Oral  Daily PRN Lauree Chandler, NP       pravastatin (PRAVACHOL) tablet 20 mg  20 mg Oral QHS Lauree Chandler, NP       traZODone (DESYREL) tablet 50 mg  50 mg Oral QHS PRN Lauree Chandler, NP   50 mg at 06/25/23 2117   PTA Medications: Medications Prior to Admission  Medication Sig Dispense Refill Last Dose   albuterol (VENTOLIN HFA) 108 (90 Base) MCG/ACT inhaler Inhale 2 puffs into the lungs every 6 (six) hours as needed for wheezing or shortness of breath. 18 g 1    aspirin (ASPIRIN CHILDRENS) 81 MG chewable tablet Chew 1 tablet (81 mg total) by mouth daily.      aspirin-acetaminophen-caffeine (EXCEDRIN MIGRAINE) 250-250-65 MG tablet Take 2 tablets by mouth every 6 (six) hours as needed for headache.      AUVELITY 45-105 MG TBCR Take 1 tablet by mouth every morning.      benzonatate (TESSALON) 200 MG capsule Take 1 capsule (200 mg total) by mouth 2 (two) times daily as needed for cough. 20 capsule 0    divalproex (DEPAKOTE ER) 250 MG 24 hr tablet Take  750 mg by mouth at bedtime.      levothyroxine (SYNTHROID) 100 MCG tablet Take 1 tablet (100 mcg total) by mouth daily. 90 tablet 3    meclizine (ANTIVERT) 25 MG tablet Take 1 tablet (25 mg total) by mouth 3 (three) times daily as needed for dizziness. 30 tablet 0    metoprolol tartrate (LOPRESSOR) 25 MG tablet Take 1 tablet (25 mg total) by mouth as directed for 3 doses. Take 1 tablet twice daily 1 day before CT scan and 1 tablet before CT scan 3 tablet 0    Multiple Vitamin (MULTIVITAMIN WITH MINERALS) TABS tablet Take 1 tablet by mouth daily.      omeprazole (PRILOSEC) 40 MG capsule Take 1 capsule (40 mg total) by mouth daily. 60 capsule 0    ondansetron (ZOFRAN) 4 MG tablet Take 1 tablet (4 mg total) by mouth every 8 (eight) hours as needed for nausea or vomiting. (Patient not taking: Reported on 06/25/2023) 20 tablet 0    pravastatin (PRAVACHOL) 20 MG tablet Take 1 tablet by mouth once daily 90 tablet 1    terbinafine (LAMISIL) 250 MG  tablet Take 1 tablet (250 mg total) by mouth daily. 90 tablet 0     Patient Stressors: Marital or family conflict   Other: the pres election    Patient Strengths: Ability for insight  Capable of independent living  Communication skills  General fund of knowledge  Supportive family/friends   Treatment Modalities: Medication Management, Group therapy, Case management,  1 to 1 session with clinician, Psychoeducation, Recreational therapy.   Physician Treatment Plan for Primary Diagnosis: <principal problem not specified> Long Term Goal(s):     Short Term Goals:    Medication Management: Evaluate patient's response, side effects, and tolerance of medication regimen.  Therapeutic Interventions: 1 to 1 sessions, Unit Group sessions and Medication administration.  Evaluation of Outcomes: Progressing  Physician Treatment Plan for Secondary Diagnosis: Active Problems:   Schizoaffective disorder (HCC)  Long Term Goal(s):     Short Term Goals:       Medication Management: Evaluate patient's response, side effects, and tolerance of medication regimen.  Therapeutic Interventions: 1 to 1 sessions, Unit Group sessions and Medication administration.  Evaluation of Outcomes: Progressing   RN Treatment Plan for Primary Diagnosis: <principal problem not specified> Long Term Goal(s): Knowledge of disease and therapeutic regimen to maintain health will improve  Short Term Goals: Ability to remain free from injury will improve, Ability to verbalize frustration and anger appropriately will improve, Ability to demonstrate self-control, Ability to participate in decision making will improve, Ability to verbalize feelings will improve, Ability to disclose and discuss suicidal ideas, Ability to identify and develop effective coping behaviors will improve, and Compliance with prescribed medications will improve  Medication Management: RN will administer medications as ordered by provider, will  assess and evaluate patient's response and provide education to patient for prescribed medication. RN will report any adverse and/or side effects to prescribing provider.  Therapeutic Interventions: 1 on 1 counseling sessions, Psychoeducation, Medication administration, Evaluate responses to treatment, Monitor vital signs and CBGs as ordered, Perform/monitor CIWA, COWS, AIMS and Fall Risk screenings as ordered, Perform wound care treatments as ordered.  Evaluation of Outcomes: Progressing   LCSW Treatment Plan for Primary Diagnosis: <principal problem not specified> Long Term Goal(s): Safe transition to appropriate next level of care at discharge, Engage patient in therapeutic group addressing interpersonal concerns.  Short Term Goals: Engage patient in aftercare planning with referrals and resources, Increase  social support, Increase ability to appropriately verbalize feelings, Increase emotional regulation, Facilitate acceptance of mental health diagnosis and concerns, Facilitate patient progression through stages of change regarding substance use diagnoses and concerns, Identify triggers associated with mental health/substance abuse issues, and Increase skills for wellness and recovery  Therapeutic Interventions: Assess for all discharge needs, 1 to 1 time with Social worker, Explore available resources and support systems, Assess for adequacy in community support network, Educate family and significant other(s) on suicide prevention, Complete Psychosocial Assessment, Interpersonal group therapy.  Evaluation of Outcomes: Progressing   Progress in Treatment: Attending groups: Yes. and No. Participating in groups: Yes. and No. Taking medication as prescribed: Yes. Toleration medication: Yes. Family/Significant other contact made: No, will contact:  CSW will contact if given permission  Patient understands diagnosis: Yes. Discussing patient identified problems/goals with staff: Yes. Medical  problems stabilized or resolved: Yes. Denies suicidal/homicidal ideation: Yes. Issues/concerns per patient self-inventory: No. Other: None   New problem(s) identified: No, Describe:  None identified   New Short Term/Long Term Goal(s): elimination of symptoms of psychosis, medication management for mood stabilization; elimination of SI thoughts; development of comprehensive mental wellness plan.   Patient Goals:  "To not fall down"  Discharge Plan or Barriers: CSW will assist with appropriate discharge planning   Reason for Continuation of Hospitalization: Anxiety Medication stabilization  Estimated Length of Stay: 1 to 7 days   Last 3 [place] Suicide Severity Risk Score: Flowsheet Row Admission (Current) from 06/25/2023 in [facility] Most recent reading at 06/25/2023  7:00 PM ED from 06/25/2023 in [facility] Most recent reading at 06/25/2023  6:55 AM ED from 05/18/2023 in [facility] Emergency Department at [facility] Most recent reading at 05/18/2023  6:50 PM  C-SSRS RISK CATEGORY No Risk No Risk No Risk       Last PHQ 2/9 Scores:    06/11/2023    1:13 PM 01/14/2023    3:16 PM 01/14/2023    3:14 PM  Depression screen PHQ 2/9  Decreased Interest 1 1 0  Down, Depressed, Hopeless 1 1 0  PHQ - 2 Score 2 2 0  Altered sleeping 0 0   Tired, decreased energy 0 0   Change in appetite 0 0   Feeling bad or failure about yourself  0 0   Trouble concentrating 0 0   Moving slowly or fidgety/restless 0 0   Suicidal thoughts 0 0   PHQ-9 Score 2 2   Difficult doing work/chores Not difficult at all Not difficult at all     Scribe for Treatment Team: Elza Rafter, [facility] 06/26/2023 10:54 AM

## 2023-06-26 NOTE — Plan of Care (Signed)
  Problem: Health Behavior/Discharge Planning: Goal: Ability to manage health-related needs will improve Outcome: Progressing   Problem: Clinical Measurements: Goal: Ability to maintain clinical measurements within normal limits will improve Outcome: Progressing  Patient is alert oriented X3 ambulating with front wheel walker. Continues to be fixed on election results stating [place] allowed to talk about her [place]" Patient compliant with medications no adverse effects noted. PRN Tessalon for cough, Vistaril for anxiety and Trazodone for sleep given and reported effective.   Patient endorse anxiety and depression. Denies SI/HI/A/VH and verbally contracts for safety Q 15 minutes safety checks ongoing. Patient remains safe. All fall protocol in place.

## 2023-06-26 NOTE — BHH Suicide Risk Assessment (Signed)
[facility] Admission Suicide Risk Assessment   Nursing information obtained from:  Patient Demographic factors:  Caucasian Current Mental Status:  NA Loss Factors:  NA Historical Factors:  NA Risk Reduction Factors:  Sense of responsibility to family  Total Time spent with patient: 1 hour Principal Problem: <principal problem not specified> Diagnosis:  Active Problems:   Schizoaffective disorder (HCC)  Subjective Data:  [name] is a 62 year old white female who was involuntarily admitted to inpatient psychiatry for depression.  She sees Dr. Donell Beers who has her diagnosed with unspecified psychosis.  She tells me that she was upset over the election and has not been sleeping.  She denies being suicidal or homicidal.  She lives with her wife and her son.  She smiles throughout the interview but states that she is depressed.  She tells me that she has had multiple admissions to psychiatry since the age of 33.  She comes across somewhat cluster B.  She takes Depakote at night for sleeping and for what she calls bipolar disorder.  She comes across a little bit paranoid and delusional but not a threat to herself or others.  Not sure how she got committed.  She states that her wife wanted her to go into the hospital to get some sleep.  She is on Auvelity, which is a new medication that has Wellbutrin in dextromethorphan.  I told her that we do not have it but I can put her back on Wellbutrin.  She states that her medications are not working anyway.   Continued Clinical Symptoms:  Alcohol Use Disorder Identification Test Final Score (AUDIT): 1 The "Alcohol Use Disorders Identification Test", Guidelines for Use in Primary Care, Second Edition.  World Science writer [facility]). Score between 0-7:  no or low risk or alcohol related problems. Score between 8-15:  moderate risk of alcohol related problems. Score between 16-19:  high risk of alcohol related problems. Score 20 or above:  warrants further diagnostic  evaluation for alcohol dependence and treatment.   CLINICAL FACTORS:   Bipolar Disorder:   Depressive phase   Musculoskeletal: Strength & Muscle Tone: within normal limits Gait & Station: normal Patient leans: N/A  Psychiatric Specialty Exam:  Presentation  General Appearance:  Casual  Eye Contact: Good  Speech: Clear and Coherent; Slow  Speech Volume: Normal  Handedness: Right   Mood and Affect  Mood: -- ("ok")  Affect: Constricted; Flat   Thought Process  Thought Processes: Coherent; Goal Directed; Linear  Descriptions of Associations:Intact  Orientation:Full (Time, Place and Person)  Thought Content:Delusions; Paranoid Ideation  History of Schizophrenia/Schizoaffective disorder:Yes  Duration of Psychotic Symptoms:Greater than six months  Hallucinations:Hallucinations: None  Ideas of Reference:Delusions; Paranoia  Suicidal Thoughts:Suicidal Thoughts: No  Homicidal Thoughts:Homicidal Thoughts: No   Sensorium  Memory: Immediate Fair  Judgment: Poor  Insight: Poor   Executive Functions  Concentration: Fair  Attention Span: Fair  Recall: Fair  Fund of Knowledge: Fair  Language: Fair   Psychomotor Activity  Psychomotor Activity: Psychomotor Activity: Decreased   Assets  Assets: Communication Skills; Financial Resources/Insurance; Housing; Resilience; Social Support   Sleep  Sleep: Sleep: Poor Number of Hours of Sleep: 4     Blood pressure (!) 99/53, pulse 89, temperature 97.9 F (36.6 C), temperature source Oral, resp. rate 18, height 5' (1.524 m), weight 63.5 kg, SpO2 100%. Body mass index is 27.34 kg/m.   COGNITIVE FEATURES THAT CONTRIBUTE TO RISK:  Thought constriction (tunnel vision)    SUICIDE RISK:   Minimal: No identifiable suicidal  ideation.  Patients presenting with no risk factors but with morbid ruminations; may be classified as minimal risk based on the severity of the depressive  symptoms  PLAN OF CARE: See orders  I certify that inpatient services furnished can reasonably be expected to improve the patient's condition.   Sarina Ill, DO 06/26/2023, 12:01 PM

## 2023-06-26 NOTE — Plan of Care (Signed)
D: Pt alert and oriented. Pt denies experiencing any anxiety/depression at this time. Pt denies experiencing any pain at this time. Pt denies experiencing any SI/HI, or AVH at this time.   A: Scheduled medications administered to pt, per MD orders. Support and encouragement provided. Frequent verbal contact made. Routine safety checks conducted q15 minutes.   R: No adverse drug reactions noted. Pt verbally contracts for safety at this time. Pt compliant with medications and treatment plan. Pt interacts well but minimally with others on the unit, mostly self isolating to room. Pt remains safe at this time. Plan of care ongoing.  Problem: Education: Goal: Knowledge of General Education information will improve Description: Including pain rating scale, medication(s)/side effects and non-pharmacologic comfort measures Outcome: Progressing   Problem: Nutrition: Goal: Adequate nutrition will be maintained Outcome: Progressing

## 2023-06-26 NOTE — Group Note (Signed)
Recreation Therapy Group Note   Group Topic:General Recreation  Group Date: 06/26/2023 Start Time: 1400 End Time: 1455 Facilitators: Rosina Lowenstein, LRT, CTRS Location: Courtyard  Group Description: Outdoor Recreation. Patients had the option to play corn hole, ring toss, bowling or listening to music while outside in the courtyard getting fresh air and sunlight. [name] Kitchen LRT and patients discussed things that they enjoy doing in their free time outside of the hospital. LRT encouraged patients to drink water after being active and getting their heart rate up.   Goal Area(s) Addressed: Patient will identify leisure interests.  Patient will practice healthy decision making. Patient will engage in recreation activity.   Affect/Mood: N/A   Participation Level: Did not attend    Clinical Observations/Individualized Feedback: [name] did not attend group.  Plan: Continue to engage patient in RT group sessions 2-3x/week.   Rosina Lowenstein, LRT, CTRS 06/26/2023 3:23 PM

## 2023-06-26 NOTE — Group Note (Unsigned)
Date:  06/26/2023 Time:  10:32 PM  Group Topic/Focus:  Wrap-Up Group:   The focus of this group is to help patients review their daily goal of treatment and discuss progress on daily workbooks.     Participation Level:  {BHH PARTICIPATION XBJYN:82956}  Participation Quality:  {BHH PARTICIPATION QUALITY:22265}  Affect:  {BHH AFFECT:22266}  Cognitive:  {BHH COGNITIVE:22267}  Insight: {BHH Insight2:20797}  Engagement in Group:  {BHH ENGAGEMENT IN OZHYQ:65784}  Modes of Intervention:  {BHH MODES OF INTERVENTION:22269}  Additional Comments:  ***  Lenore Cordia 06/26/2023, 10:32 PM

## 2023-06-27 DIAGNOSIS — F3130 Bipolar disorder, current episode depressed, mild or moderate severity, unspecified: Secondary | ICD-10-CM | POA: Diagnosis not present

## 2023-06-27 MED ORDER — RISPERIDONE 0.25 MG PO TABS
0.2500 mg | ORAL_TABLET | ORAL | Status: DC
  Administered 2023-06-27 – 2023-07-02 (×11): 0.25 mg via ORAL
  Filled 2023-06-27 (×11): qty 1

## 2023-06-27 NOTE — Progress Notes (Signed)
   06/27/23 0100  Psych Admission Type (Psych Patients Only)  Admission Status Voluntary  Psychosocial Assessment  Patient Complaints Anxiety  Eye Contact Fair  Facial Expression Anxious  Affect Anxious  Speech Logical/coherent  Interaction Minimal  Motor Activity Slow  Appearance/Hygiene Unremarkable  Behavior Characteristics Cooperative;Appropriate to situation  Mood Anxious  Thought Process  Coherency WDL  Content WDL  Delusions None reported or observed  Perception WDL  Hallucination None reported or observed  Judgment Poor  Confusion None  Danger to Self  Current suicidal ideation? Denies  Agreement Not to Harm Self Yes  Description of Agreement verbal  Danger to Others  Danger to Others None reported or observed

## 2023-06-27 NOTE — Group Note (Signed)
Date:  06/27/2023 Time:  1:29 AM  Group Topic/Focus:  Goals Group:   The focus of this group is to help patients establish daily goals to achieve during treatment and discuss how the patient can incorporate goal setting into their daily lives to aide in recovery. Recovery Goals:   The focus of this group is to identify appropriate goals for recovery and establish a plan to achieve them. Relapse Prevention Planning:   The focus of this group is to define relapse and discuss the need for planning to combat relapse.    Participation Level:  Active  Participation Quality:  Sharing  Affect:  Appropriate  Cognitive:  Appropriate  Insight: Good  Engagement in Group:  Engaged  Modes of Intervention:  Activity and Discussion  Additional Comments:    Dallie Piles Jediah Horger 06/27/2023, 1:29 AM

## 2023-06-27 NOTE — Plan of Care (Signed)
D: Pt alert and oriented. Pt reports experiencing anxiety however denies experiencing any depression at this time. Pt denies experiencing any pain at this time. Pt denies experiencing any SI/HI, or AVH at this time.   A: Scheduled medications administered to pt, per MD orders. Support and encouragement provided. Frequent verbal contact made. Routine safety checks conducted q15 minutes.   R: No adverse drug reactions noted. Pt verbally contracts for safety at this time. Pt compliant with medications and treatment plan. Pt interacts well with others on the unit. Pt remains safe at this time. Plan of care ongoing.  Pt attended groups today.  Problem: Education: Goal: Knowledge of General Education information will improve Description: Including pain rating scale, medication(s)/side effects and non-pharmacologic comfort measures Outcome: Progressing   Problem: Coping: Goal: Level of anxiety will decrease Outcome: Not Progressing
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
## 2023-06-27 NOTE — Progress Notes (Signed)
[facility] MD Progress Note  06/27/2023 12:59 PM [name]  MRN:  [id number] Subjective: [name] is seen on rounds.  She says that she is feeling better but she is feeling sleepy.  Other than that she says that she feels like the new medications have been helpful for her depression.  I told her we can go down on the Risperdal. Principal Problem: <principal problem not specified> Diagnosis: Active Problems:   Schizoaffective disorder (HCC)  Total Time spent with patient: 15 minutes  Past Psychiatric History: Bipolar disorder, cluster B traits, unspecified psychosis.  Past Medical History:  Past Medical History:  Diagnosis Date   Abnormal LFTs 04/06/2013   Very minor tried to reassure okay to repeat today with hepatitis C screen   Allergic rhinitis 03/26/2007   Autonomic dysfunction 01/11/2015   Bipolar 1 disorder    Hx of psychosis; present since age 29   Chest pain on breathing 03/16/2014   Chronic fatigue 06/21/2017   Chronic interstitial cystitis 11/15/2020   Community acquired pneumonia of right lung 08/17/2020   Complication of anesthesia    agitation   Constipation 05/17/2012   Costochondritis 02/14/2014   Diverticular disease of colon 08/06/2020   Double vision 03/15/2011   Dyspnea on effort 01/10/2011   Dysuria 02/15/2013   Early satiety 04/27/2013   Elevated lactic acid level    Endometriosis 05/11/2008   Fatty liver disease, nonalcoholic 02/13/2014   Fecal urgency 08/06/2020   Female stress incontinence 11/15/2020   Food aversion 11/08/2012   Frequency of urination    Gastroesophageal reflux disease 08/06/2020   Headache    History of duodenal ulcer    History of hysterectomy 11/15/2020   History of kidney stones    History of lithium toxicity 05/01/2014   Hoarseness of voice 02/27/2016   Hyperglycemia 01/10/2011   Hyperlipidemia, mixed 05/12/2017   Hyperprolactinemia 06/26/2015   Insomnia 05/12/2017   Irritable bowel syndrome 08/06/2020   Jaw pain  02/13/2012   poss tmj area     Left upper quadrant pain 08/06/2020   Leukocytosis 05/01/2014   Lump of breast, left 12/20/2012   about 6 oclock    Mass of urethra 11/15/2020   Mild cognitive impairment of uncertain or unknown etiology 10/16/2020   Multinodular goiter 05/12/2017   Nocturia    Nondiabetic gastroparesis 01/11/2015   Noninvasive follicular neoplasm of thyroid with papillary-like nuclear features 10/10/2021   Palpitations 12/08/2010   Pelvic pain    Periumbilical pain 08/06/2020   Plantar fasciitis 06/11/2007   Postsurgical hypothyroidism 09/03/2021   Pruritus ani 08/06/2020   Right ear pain 02/16/2012   Social anxiety disorder    Subacute bronchitis 07/10/2018   Type II diabetes mellitus 10/10/2021   Unstable gait 07/05/2022   Urgency of urination    Vitamin B12 deficiency 09/03/2021   Vitamin D deficiency 09/23/2011    Past Surgical History:  Procedure Laterality Date   CARDIAC CATHETERIZATION  10-09-1999  DR KATZ   NORMAL LVF/  NORMAL RCA/  NO CRITICAL DISEASE LEFT CORONARY SYSTEM   CARDIOVASCULAR STRESS TEST  01-23-2011   NORMAL NUCLEAR STUDY/ NO ISCHEMIA/ EF 81%   CYSTOSCOPY WITH BIOPSY N/A 06/03/2013   Procedure: CYSTOSCOPY WITH BIOPSY  INSTILLATION OF MARCAINE AND PYRIDIUM;  Surgeon: Lindaann Slough, MD;  Location: [facility];  Service: Urology;  Laterality: N/A;   LAPAROSCOPIC CHOLECYSTECTOMY  11-08-2001   LAPAROSCOPIC LEFT SALPINGOOPHORECTOMY AND LYSYS ADHESIONS  03-10-2002   LAPAROSCOPIC REMOVAL OVARY REMNANT  2003   [place]  LAPAROSCOPY N/A 12/14/2012   Procedure: LAPAROSCOPY DIAGNOSTIC;  Surgeon: Almond Lint, MD;  Location: [facility];  Service: General;  Laterality: N/A;   THYROIDECTOMY N/A 05/24/2021   Procedure: TOTAL THYROIDECTOMY;  Surgeon: Darnell Level, MD;  Location: [facility];  Service: General;  Laterality: N/A;   TOTAL ABDOMINAL HYSTERECTOMY  2000   W/ RIGHT SALPINGOOPHORECTOMY   TRANSTHORACIC ECHOCARDIOGRAM  10-13-2010    NORMAL LVF/  EF 55-60%   Family History:  Family History  Problem Relation Age of Onset   Other Mother        MAC infection   Anxiety disorder Mother    Dementia Mother        likely Alzheimer's disease; symptom onset on late 70s/early 80s   Sleep apnea Father    Depression Father    Alcohol abuse Father    Hypertension Father    Migraines Sister        headaches   Anxiety disorder Sister    Depression Sister    Heart disease Paternal Grandmother    Hyperlipidemia Paternal Grandmother    Alcohol abuse Paternal Grandfather    Diabetes Neg Hx    Family Psychiatric  History: Unremarkable Social History:  Social History   Substance and Sexual Activity  Alcohol Use Not Currently   Comment: occ     Social History   Substance and Sexual Activity  Drug Use Never    Social History   Socioeconomic History   Marital status: Married    Spouse name: Not on file   Number of children: 0   Years of education: 16   Highest education level: Bachelor's degree (e.g., BA, AB, BS)  Occupational History   Occupation: Disability    Comment: Nurse  Tobacco Use   Smoking status: Former    Current packs/day: 0.00    Types: Cigarettes    Start date: 1979    Quit date: 1980    Years since quitting: 44.8   Smokeless tobacco: Never   Tobacco comments:    ONLY SMOKED FOR 6 MONTHS --  QUIT  YRS AGO  Vaping Use   Vaping status: Never Used  Substance and Sexual Activity   Alcohol use: Not Currently    Comment: occ   Drug use: Never   Sexual activity: Not on file  Other Topics Concern   Not on file  Social History Narrative   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    no tobacco   Caffeine Use: very little, three times a week   Left handed    Social Determinants of Health   Financial Resource Strain: Low Risk  (05/20/2023)   Overall Financial Resource  Strain (CARDIA)    Difficulty of Paying Living Expenses: Not hard at all  Food Insecurity: No Food Insecurity (06/25/2023)   Hunger Vital Sign    Worried About Running Out of Food in the Last Year: Never true    Ran Out of Food in the Last Year: Never true  Transportation Needs: No Transportation Needs (06/25/2023)   PRAPARE - Administrator, Civil Service (Medical): No    Lack of Transportation (Non-Medical): No  Physical Activity: Insufficiently Active (05/20/2023)   Exercise Vital Sign    Days of Exercise per Week: 1 day    Minutes of Exercise per Session: 10 min  Stress: Stress Concern Present (05/20/2023)  [employer] of Occupational Health - Occupational Stress Questionnaire    Feeling of Stress : To some extent  Social Connections: Socially Isolated (05/20/2023)   Social Connection and Isolation Panel [NHANES]    Frequency of Communication with Friends and Family: Never    Frequency of Social Gatherings with Friends and Family: Once a week    Attends Religious Services: Never    Database administrator or Organizations: No    Attends Engineer, structural: Never    Marital Status: Married   Additional Social History:                         Sleep: Good  Appetite:  Good  Current Medications: Current Facility-Administered Medications  Medication Dose Route Frequency Provider Last Rate Last Admin   acetaminophen (TYLENOL) tablet 650 mg  650 mg Oral Q6H PRN Lauree Chandler, NP       albuterol (VENTOLIN HFA) 108 (90 Base) MCG/ACT inhaler 2 puff  2 puff Inhalation Q6H PRN Lauree Chandler, NP       alum & mag hydroxide-simeth (MAALOX/MYLANTA) 200-200-20 MG/5ML suspension 30 mL  30 mL Oral Q4H PRN Lauree Chandler, NP       benzonatate (TESSALON) capsule 200 mg  200 mg Oral BID PRN Sarina Ill, DO   200 mg at 06/27/23 0929   buPROPion (WELLBUTRIN XL) 24 hr tablet 150 mg  150 mg Oral Daily Sarina Ill, DO   150 mg  at 06/27/23 1610   diphenhydrAMINE (BENADRYL) capsule 50 mg  50 mg Oral TID PRN Lauree Chandler, NP       Or   diphenhydrAMINE (BENADRYL) injection 50 mg  50 mg Intramuscular TID PRN Lauree Chandler, NP       divalproex (DEPAKOTE ER) 24 hr tablet 750 mg  750 mg Oral QHS Lauree Chandler, NP   750 mg at 06/26/23 2133   FLUoxetine (PROZAC) capsule 20 mg  20 mg Oral Daily Sarina Ill, DO   20 mg at 06/27/23 9604   guaiFENesin (MUCINEX) 12 hr tablet 600 mg  600 mg Oral BID Sarina Ill, DO   600 mg at 06/27/23 5409   haloperidol (HALDOL) tablet 5 mg  5 mg Oral TID PRN Lauree Chandler, NP       Or   haloperidol lactate (HALDOL) injection 5 mg  5 mg Intramuscular TID PRN Lauree Chandler, NP       hydrOXYzine (ATARAX) tablet 25 mg  25 mg Oral TID PRN Lauree Chandler, NP   25 mg at 06/25/23 2117   levothyroxine (SYNTHROID) tablet 100 mcg  100 mcg Oral Q0600 Lauree Chandler, NP   100 mcg at 06/27/23 8119   LORazepam (ATIVAN) tablet 2 mg  2 mg Oral TID PRN Lauree Chandler, NP       Or   LORazepam (ATIVAN) injection 2 mg  2 mg Intramuscular TID PRN Lauree Chandler, NP       magnesium hydroxide (MILK OF MAGNESIA) suspension 30 mL  30 mL Oral Daily PRN Lauree Chandler, NP       pravastatin (PRAVACHOL) tablet 20 mg  20 mg Oral QHS Lauree Chandler, NP   20 mg at 06/26/23 2133   risperiDONE (RISPERDAL) tablet 0.25 mg  0.25 mg Oral [id number] Sarina Ill, DO       traZODone (DESYREL) tablet 50 mg  50 mg Oral  QHS PRN Lauree Chandler, NP   50 mg at 06/26/23 2134    Lab Results: No results found for this or any previous visit (from the past 48 hour(s)).  Blood Alcohol level:  Lab Results  Component Value Date   ETH <10 06/25/2023   ETH <5 06/18/2015    Metabolic Disorder Labs: Lab Results  Component Value Date   HGBA1C 6.0 (A) 05/01/2023   MPG 102.54 08/18/2020   MPG 117 06/21/2015   Lab Results  Component Value Date    PROLACTIN 5.2 11/28/2019   PROLACTIN 112.5 (H) 06/21/2015   Lab Results  Component Value Date   CHOL 133 08/19/2022   TRIG 230.0 (H) 08/19/2022   HDL 41.60 08/19/2022   CHOLHDL 3 08/19/2022   VLDL 46.0 (H) 08/19/2022   LDLCALC 54 05/15/2017   LDLCALC 75 06/21/2015    Physical Findings: AIMS:  , ,  ,  ,    CIWA:    COWS:     Musculoskeletal: Strength & Muscle Tone: within normal limits Gait & Station: normal Patient leans: N/A  Psychiatric Specialty Exam:  Presentation  General Appearance:  Casual  Eye Contact: Good  Speech: Clear and Coherent; Slow  Speech Volume: Normal  Handedness: Right   Mood and Affect  Mood: -- ("ok")  Affect: Constricted; Flat   Thought Process  Thought Processes: Coherent; Goal Directed; Linear  Descriptions of Associations:Intact  Orientation:Full (Time, Place and Person)  Thought Content:Delusions; Paranoid Ideation  History of Schizophrenia/Schizoaffective disorder:Yes  Duration of Psychotic Symptoms:Greater than six months  Hallucinations:No data recorded Ideas of Reference:Delusions; Paranoia  Suicidal Thoughts:No data recorded Homicidal Thoughts:No data recorded  Sensorium  Memory: Immediate Fair  Judgment: Poor  Insight: Poor   Executive Functions  Concentration: Fair  Attention Span: Fair  Recall: [employer] of Knowledge: Fair  Language: Fair   Psychomotor Activity  Psychomotor Activity:No data recorded  Assets  Assets: Communication Skills; Financial Resources/Insurance; Housing; Resilience; Social Support   Sleep  Sleep:No data recorded    Blood pressure 106/62, pulse 81, temperature (!) 97.2 F (36.2 C), resp. rate 16, height 5' (1.524 m), weight 63.5 kg, SpO2 98%. Body mass index is 27.34 kg/m.   Treatment Plan Summary: Daily contact with patient to assess and evaluate symptoms and progress in treatment, Medication management, and Plan decrease Risperdal to 0.25  mg twice a day.  Sarina Ill, DO 06/27/2023, 12:59 PM

## 2023-06-27 NOTE — Group Note (Unsigned)
Date:  06/27/2023 Time:  6:22 PM  Group Topic/Focus:  Healthy coping skills     Participation Level:  {BHH PARTICIPATION GEXBM:84132}  Participation Quality:  {BHH PARTICIPATION QUALITY:22265}  Affect:  {BHH AFFECT:22266}  Cognitive:  {BHH COGNITIVE:22267}  Insight: {BHH Insight2:20797}  Engagement in Group:  {BHH ENGAGEMENT IN GMWNU:27253}  Modes of Intervention:  {BHH MODES OF INTERVENTION:22269}  Additional Comments:  ***  DEION NORQUIST 06/27/2023, 6:22 PM

## 2023-06-27 NOTE — Progress Notes (Signed)
   06/27/23 2300  Psych Admission Type (Psych Patients Only)  Admission Status Voluntary  Psychosocial Assessment  Patient Complaints Anxiety  Eye Contact Fair  Facial Expression Anxious  Affect Anxious  Speech Logical/coherent  Interaction Minimal  Motor Activity Slow  Appearance/Hygiene Unremarkable  Behavior Characteristics Cooperative  Mood Anxious  Thought Process  Coherency WDL  Content WDL  Delusions None reported or observed  Perception WDL  Hallucination None reported or observed  Judgment Poor  Confusion None  Danger to Self  Current suicidal ideation? Denies  Agreement Not to Harm Self Yes  Description of Agreement verbal  Danger to Others  Danger to Others None reported or observed

## 2023-06-27 NOTE — Plan of Care (Signed)
  Problem: Education: Goal: Knowledge of General Education information will improve Description Including pain rating scale, medication(s)/side effects and non-pharmacologic comfort measures Outcome: Progressing   Problem: Health Behavior/Discharge Planning: Goal: Ability to manage health-related needs will improve Outcome: Progressing   

## 2023-06-27 NOTE — Group Note (Signed)
LCSW Group Therapy Note   Group Date: 06/27/2023 Start Time: 1300 End Time: 1340   Type of Therapy and Topic:  Group Therapy: Mindfullness Monitoring  Participation Level:  Did Not Attend   Summary of Patient Progress:  Patient did not attend    Therapeutic Modalities: Cognitive Behavioral Therapy  Azucena Kuba, LCSWA 06/27/2023  4:38 PM

## 2023-06-28 DIAGNOSIS — F3130 Bipolar disorder, current episode depressed, mild or moderate severity, unspecified: Secondary | ICD-10-CM | POA: Diagnosis not present

## 2023-06-28 NOTE — Progress Notes (Signed)
[facility] MD Progress Note  06/28/2023 12:57 PM [name]  MRN:  [id number] Subjective: [name] is seen on rounds.  She states that she is not as groggy since decreasing the Risperdal.  She does like the medication changes and seems to be doing better depression wise.  She has no complaints.  No side effects from her medicines.  Nurses report no issues. Principal Problem: <principal problem not specified> Diagnosis: Active Problems:   Schizoaffective disorder (HCC)  Total Time spent with patient: 15 minutes  Past Psychiatric History: Schizoaffective disorder, sees Dr. Donell Beers, cluster B traits.  Past Medical History:  Past Medical History:  Diagnosis Date   Abnormal LFTs 04/06/2013   Very minor tried to reassure okay to repeat today with hepatitis C screen   Allergic rhinitis 03/26/2007   Autonomic dysfunction 01/11/2015   Bipolar 1 disorder    Hx of psychosis; present since age 18   Chest pain on breathing 03/16/2014   Chronic fatigue 06/21/2017   Chronic interstitial cystitis 11/15/2020   Community acquired pneumonia of right lung 08/17/2020   Complication of anesthesia    agitation   Constipation 05/17/2012   Costochondritis 02/14/2014   Diverticular disease of colon 08/06/2020   Double vision 03/15/2011   Dyspnea on effort 01/10/2011   Dysuria 02/15/2013   Early satiety 04/27/2013   Elevated lactic acid level    Endometriosis 05/11/2008   Fatty liver disease, nonalcoholic 02/13/2014   Fecal urgency 08/06/2020   Female stress incontinence 11/15/2020   Food aversion 11/08/2012   Frequency of urination    Gastroesophageal reflux disease 08/06/2020   Headache    History of duodenal ulcer    History of hysterectomy 11/15/2020   History of kidney stones    History of lithium toxicity 05/01/2014   Hoarseness of voice 02/27/2016   Hyperglycemia 01/10/2011   Hyperlipidemia, mixed 05/12/2017   Hyperprolactinemia 06/26/2015   Insomnia 05/12/2017   Irritable bowel syndrome  08/06/2020   Jaw pain 02/13/2012   poss tmj area     Left upper quadrant pain 08/06/2020   Leukocytosis 05/01/2014   Lump of breast, left 12/20/2012   about 6 oclock    Mass of urethra 11/15/2020   Mild cognitive impairment of uncertain or unknown etiology 10/16/2020   Multinodular goiter 05/12/2017   Nocturia    Nondiabetic gastroparesis 01/11/2015   Noninvasive follicular neoplasm of thyroid with papillary-like nuclear features 10/10/2021   Palpitations 12/08/2010   Pelvic pain    Periumbilical pain 08/06/2020   Plantar fasciitis 06/11/2007   Postsurgical hypothyroidism 09/03/2021   Pruritus ani 08/06/2020   Right ear pain 02/16/2012   Social anxiety disorder    Subacute bronchitis 07/10/2018   Type II diabetes mellitus 10/10/2021   Unstable gait 07/05/2022   Urgency of urination    Vitamin B12 deficiency 09/03/2021   Vitamin D deficiency 09/23/2011    Past Surgical History:  Procedure Laterality Date   CARDIAC CATHETERIZATION  10-09-1999  DR KATZ   NORMAL LVF/  NORMAL RCA/  NO CRITICAL DISEASE LEFT CORONARY SYSTEM   CARDIOVASCULAR STRESS TEST  01-23-2011   NORMAL NUCLEAR STUDY/ NO ISCHEMIA/ EF 81%   CYSTOSCOPY WITH BIOPSY N/A 06/03/2013   Procedure: CYSTOSCOPY WITH BIOPSY  INSTILLATION OF MARCAINE AND PYRIDIUM;  Surgeon: Lindaann Slough, MD;  Location: [facility];  Service: Urology;  Laterality: N/A;   LAPAROSCOPIC CHOLECYSTECTOMY  11-08-2001   LAPAROSCOPIC LEFT SALPINGOOPHORECTOMY AND LYSYS ADHESIONS  03-10-2002   LAPAROSCOPIC REMOVAL OVARY REMNANT  2003  [place]   LAPAROSCOPY N/A 12/14/2012   Procedure: LAPAROSCOPY DIAGNOSTIC;  Surgeon: Almond Lint, MD;  Location: [facility];  Service: General;  Laterality: N/A;   THYROIDECTOMY N/A 05/24/2021   Procedure: TOTAL THYROIDECTOMY;  Surgeon: Darnell Level, MD;  Location: [facility];  Service: General;  Laterality: N/A;   TOTAL ABDOMINAL HYSTERECTOMY  2000   W/ RIGHT SALPINGOOPHORECTOMY   TRANSTHORACIC  ECHOCARDIOGRAM  10-13-2010   NORMAL LVF/  EF 55-60%   Family History:  Family History  Problem Relation Age of Onset   Other Mother        MAC infection   Anxiety disorder Mother    Dementia Mother        likely Alzheimer's disease; symptom onset on late 70s/early 80s   Sleep apnea Father    Depression Father    Alcohol abuse Father    Hypertension Father    Migraines Sister        headaches   Anxiety disorder Sister    Depression Sister    Heart disease Paternal Grandmother    Hyperlipidemia Paternal Grandmother    Alcohol abuse Paternal Grandfather    Diabetes Neg Hx    Family Psychiatric  History: Unremarkable Social History:  Social History   Substance and Sexual Activity  Alcohol Use Not Currently   Comment: occ     Social History   Substance and Sexual Activity  Drug Use Never    Social History   Socioeconomic History   Marital status: Married    Spouse name: Not on file   Number of children: 0   Years of education: 16   Highest education level: Bachelor's degree (e.g., BA, AB, BS)  Occupational History   Occupation: Disability    Comment: Nurse  Tobacco Use   Smoking status: Former    Current packs/day: 0.00    Types: Cigarettes    Start date: 1979    Quit date: 1980    Years since quitting: 44.8   Smokeless tobacco: Never   Tobacco comments:    ONLY SMOKED FOR 6 MONTHS --  QUIT  YRS AGO  Vaping Use   Vaping status: Never Used  Substance and Sexual Activity   Alcohol use: Not Currently    Comment: occ   Drug use: Never   Sexual activity: Not on file  Other Topics Concern   Not on file  Social History Narrative   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    no tobacco   Caffeine Use: very little, three times a week   Left handed    Social Determinants of Health   Financial Resource Strain: Low Risk  (05/20/2023)    Overall Financial Resource Strain (CARDIA)    Difficulty of Paying Living Expenses: Not hard at all  Food Insecurity: No Food Insecurity (06/25/2023)   Hunger Vital Sign    Worried About Running Out of Food in the Last Year: Never true    Ran Out of Food in the Last Year: Never true  Transportation Needs: No Transportation Needs (06/25/2023)   PRAPARE - Administrator, Civil Service (Medical): No    Lack of Transportation (Non-Medical): No  Physical Activity: Insufficiently Active (05/20/2023)   Exercise Vital Sign    Days of Exercise per Week: 1 day    Minutes of Exercise per Session: 10 min  Stress: Stress  Concern Present (05/20/2023)   [employer] of Occupational Health - Occupational Stress Questionnaire    Feeling of Stress : To some extent  Social Connections: Socially Isolated (05/20/2023)   Social Connection and Isolation Panel [NHANES]    Frequency of Communication with Friends and Family: Never    Frequency of Social Gatherings with Friends and Family: Once a week    Attends Religious Services: Never    Database administrator or Organizations: No    Attends Engineer, structural: Never    Marital Status: Married   Additional Social History:                         Sleep: Good  Appetite:  Good  Current Medications: Current Facility-Administered Medications  Medication Dose Route Frequency Provider Last Rate Last Admin   acetaminophen (TYLENOL) tablet 650 mg  650 mg Oral Q6H PRN Lauree Chandler, NP       albuterol (VENTOLIN HFA) 108 (90 Base) MCG/ACT inhaler 2 puff  2 puff Inhalation Q6H PRN Lauree Chandler, NP       alum & mag hydroxide-simeth (MAALOX/MYLANTA) 200-200-20 MG/5ML suspension 30 mL  30 mL Oral Q4H PRN Lauree Chandler, NP       benzonatate (TESSALON) capsule 200 mg  200 mg Oral BID PRN Sarina Ill, DO   200 mg at 06/28/23 3086   buPROPion (WELLBUTRIN XL) 24 hr tablet 150 mg  150 mg Oral Daily Sarina Ill, DO   150 mg at 06/28/23 5784   diphenhydrAMINE (BENADRYL) capsule 50 mg  50 mg Oral TID PRN Lauree Chandler, NP       Or   diphenhydrAMINE (BENADRYL) injection 50 mg  50 mg Intramuscular TID PRN Lauree Chandler, NP       divalproex (DEPAKOTE ER) 24 hr tablet 750 mg  750 mg Oral QHS Lauree Chandler, NP   750 mg at 06/27/23 2112   FLUoxetine (PROZAC) capsule 20 mg  20 mg Oral Daily Sarina Ill, DO   20 mg at 06/28/23 6962   guaiFENesin (MUCINEX) 12 hr tablet 600 mg  600 mg Oral BID Sarina Ill, DO   600 mg at 06/28/23 9528   haloperidol (HALDOL) tablet 5 mg  5 mg Oral TID PRN Lauree Chandler, NP       Or   haloperidol lactate (HALDOL) injection 5 mg  5 mg Intramuscular TID PRN Lauree Chandler, NP       hydrOXYzine (ATARAX) tablet 25 mg  25 mg Oral TID PRN Lauree Chandler, NP   25 mg at 06/27/23 2113   levothyroxine (SYNTHROID) tablet 100 mcg  100 mcg Oral Q0600 Lauree Chandler, NP   100 mcg at 06/28/23 0610   LORazepam (ATIVAN) tablet 2 mg  2 mg Oral TID PRN Lauree Chandler, NP       Or   LORazepam (ATIVAN) injection 2 mg  2 mg Intramuscular TID PRN Lauree Chandler, NP       magnesium hydroxide (MILK OF MAGNESIA) suspension 30 mL  30 mL Oral Daily PRN Lauree Chandler, NP       pravastatin (PRAVACHOL) tablet 20 mg  20 mg Oral QHS Lauree Chandler, NP   20 mg at 06/27/23 2112   risperiDONE (RISPERDAL) tablet 0.25 mg  0.25 mg Oral [id number] Sarina Ill, DO   0.25 mg at 06/28/23 4132   traZODone (  DESYREL) tablet 50 mg  50 mg Oral QHS PRN Lauree Chandler, NP   50 mg at 06/27/23 2112    Lab Results: No results found for this or any previous visit (from the past 48 hour(s)).  Blood Alcohol level:  Lab Results  Component Value Date   ETH <10 06/25/2023   ETH <5 06/18/2015    Metabolic Disorder Labs: Lab Results  Component Value Date   HGBA1C 6.0 (A) 05/01/2023   MPG 102.54 08/18/2020   MPG 117  06/21/2015   Lab Results  Component Value Date   PROLACTIN 5.2 11/28/2019   PROLACTIN 112.5 (H) 06/21/2015   Lab Results  Component Value Date   CHOL 133 08/19/2022   TRIG 230.0 (H) 08/19/2022   HDL 41.60 08/19/2022   CHOLHDL 3 08/19/2022   VLDL 46.0 (H) 08/19/2022   LDLCALC 54 05/15/2017   LDLCALC 75 06/21/2015    Physical Findings: AIMS:  , ,  ,  ,    CIWA:    COWS:     Musculoskeletal: Strength & Muscle Tone: within normal limits Gait & Station: normal Patient leans: N/A  Psychiatric Specialty Exam:  Presentation  General Appearance:  Casual  Eye Contact: Good  Speech: Clear and Coherent; Slow  Speech Volume: Normal  Handedness: Right   Mood and Affect  Mood: -- ("ok")  Affect: Constricted; Flat   Thought Process  Thought Processes: Coherent; Goal Directed; Linear  Descriptions of Associations:Intact  Orientation:Full (Time, Place and Person)  Thought Content:Delusions; Paranoid Ideation  History of Schizophrenia/Schizoaffective disorder:Yes  Duration of Psychotic Symptoms:Greater than six months  Hallucinations:No data recorded Ideas of Reference:Delusions; Paranoia  Suicidal Thoughts:No data recorded Homicidal Thoughts:No data recorded  Sensorium  Memory: Immediate Fair  Judgment: Poor  Insight: Poor   Executive Functions  Concentration: Fair  Attention Span: Fair  Recall: [employer] of Knowledge: Fair  Language: Fair   Psychomotor Activity  Psychomotor Activity:No data recorded  Assets  Assets: Communication Skills; Financial Resources/Insurance; Housing; Resilience; Social Support   Sleep  Sleep:No data recorded    Blood pressure 112/68, pulse 72, temperature (!) 97.5 F (36.4 C), resp. rate 18, height 5' (1.524 m), weight 63.5 kg, SpO2 96%. Body mass index is 27.34 kg/m.   Treatment Plan Summary: Daily contact with patient to assess and evaluate symptoms and progress in treatment,  Medication management, and Plan continue current medication.  Sarina Ill, DO 06/28/2023, 12:57 PM

## 2023-06-28 NOTE — Plan of Care (Signed)
Pt denies SI / HI / AVH at this time. No signs of distress or injury. Pt is isolative to room, is out in milieu for meals. Staff will continue to monitor q15 for safety.    Problem: Education: Goal: Knowledge of General Education information will improve Description: Including pain rating scale, medication(s)/side effects and non-pharmacologic comfort measures Outcome: Progressing   Problem: Health Behavior/Discharge Planning: Goal: Ability to manage health-related needs will improve Outcome: Progressing   Problem: Clinical Measurements: Goal: Ability to maintain clinical measurements within normal limits will improve Outcome: Progressing

## 2023-06-28 NOTE — Progress Notes (Signed)
   06/28/23 0600  15 Minute Checks  Location Bedroom  Visual Appearance Calm  Behavior Sleeping  Sleep (Behavioral Health Patients Only)  Calculate sleep? (Click Yes once per 24 hr at 0600 safety check) Yes  Documented sleep last 24 hours 9.25

## 2023-06-28 NOTE — Group Note (Signed)
Date:  06/28/2023 Time:  10:50 PM  Group Topic/Focus:  Making Healthy Choices:   The focus of this group is to help patients identify negative/unhealthy choices they were using prior to admission and identify positive/healthier coping strategies to replace them upon discharge.    Participation Level:  Active  Participation Quality:  Appropriate  Affect:  Appropriate  Cognitive:  Appropriate  Insight: Appropriate  Engagement in Group:  Engaged  Modes of Intervention:  Discussion  Additional Comments:    [name] 06/28/2023, 10:50 PM

## 2023-06-28 NOTE — Group Note (Signed)
Date:  06/28/2023 Time:  11:05 AM  Group Topic/Focus:  Movement Therapy    Participation Level:  Active  Participation Quality:  Appropriate  Affect:  Appropriate  Cognitive:  Appropriate  Insight: Appropriate  Engagement in Group:  Engaged  Modes of Intervention:  Activity  Additional Comments:  none  Rodena Goldmann 06/28/2023, 11:05 AM

## 2023-06-28 NOTE — Progress Notes (Signed)
Pt is alert and oriented . Affect is bright/Mood is congruent. Denies Anxiety, depression and pain. Pt also denies SI/HI/AVH at this time. Pt reported a cough. She had scheduled meds for .   Scheduled meds administered per MD orders . Support and encouragement provided . Routine safety checks conducted every 15 minutes . Pt informed to notify staff with problems and concerns and pt verbalized understanding.   No adverse drug reactions noted. Pt complaint with medications and treatment plan. Pt is receptive, calm , cooperative and interacts well with others on the unit. Pt contracts for safety and remains safe on the unit at this time.   [name] Kitchen

## 2023-06-29 ENCOUNTER — Ambulatory Visit: Payer: Medicare PPO | Admitting: Podiatry

## 2023-06-29 DIAGNOSIS — F3130 Bipolar disorder, current episode depressed, mild or moderate severity, unspecified: Secondary | ICD-10-CM | POA: Diagnosis not present

## 2023-06-29 NOTE — [facility] Suicide Risk Assessment (Signed)
[facility] INPATIENT:  Family/Significant Other Suicide Prevention Education  Suicide Prevention Education:  Education Completed; [name] [phone] ), wife, has been identified by the patient as the family member/significant other with whom the patient will be residing, and identified as the person(s) who will aid the patient in the event of a mental health crisis (suicidal ideations/suicide attempt).  With written consent from the patient, the family member/significant other has been provided the following suicide prevention education, prior to the and/or following the discharge of the patient.  The suicide prevention education provided includes the following: Suicide risk factors Suicide prevention and interventions National Suicide Hotline telephone number [facility] assessment telephone number [facility] Emergency Assistance 911 [facility] and/or Residential Mobile Crisis Unit telephone number  Request made of family/significant other to: Remove weapons (e.g., guns, rifles, knives), all items previously/currently identified as safety concern.   Remove drugs/medications (over-the-counter, prescriptions, illicit drugs), all items previously/currently identified as a safety concern.  The family member/significant other verbalizes understanding of the suicide prevention education information provided.  The family member/significant other agrees to remove the items of safety concern listed above.  Elza Rafter 06/29/2023, 1:26 PM

## 2023-06-29 NOTE — Progress Notes (Signed)
[facility] MD Progress Note  06/29/2023 12:55 PM [name]  MRN:  [id number] Subjective: [name] is seen on rounds.  She states that her mood is getting better.  She denies any side effects from her medication.  She sees Dr. Donell Beers outpatient.  Her suicidal thoughts are subsiding.  She has a long history of self-harm.  She likes the medication changes.  She has been compliant and in good controls.  If she continues to improve I told her that she will most likely go home soon.  Social work is working on follow-up. Principal Problem: <principal problem not specified> Diagnosis: Active Problems:   Schizoaffective disorder (HCC)  Total Time spent with patient: 15 minutes  Past Psychiatric History: Long history of schizoaffective disorder or unspecified psychosis and cluster B traits. Past Medical History:  Past Medical History:  Diagnosis Date   Abnormal LFTs 04/06/2013   Very minor tried to reassure okay to repeat today with hepatitis C screen   Allergic rhinitis 03/26/2007   Autonomic dysfunction 01/11/2015   Bipolar 1 disorder    Hx of psychosis; present since age 69   Chest pain on breathing 03/16/2014   Chronic fatigue 06/21/2017   Chronic interstitial cystitis 11/15/2020   Community acquired pneumonia of right lung 08/17/2020   Complication of anesthesia    agitation   Constipation 05/17/2012   Costochondritis 02/14/2014   Diverticular disease of colon 08/06/2020   Double vision 03/15/2011   Dyspnea on effort 01/10/2011   Dysuria 02/15/2013   Early satiety 04/27/2013   Elevated lactic acid level    Endometriosis 05/11/2008   Fatty liver disease, nonalcoholic 02/13/2014   Fecal urgency 08/06/2020   Female stress incontinence 11/15/2020   Food aversion 11/08/2012   Frequency of urination    Gastroesophageal reflux disease 08/06/2020   Headache    History of duodenal ulcer    History of hysterectomy 11/15/2020   History of kidney stones    History of lithium toxicity  05/01/2014   Hoarseness of voice 02/27/2016   Hyperglycemia 01/10/2011   Hyperlipidemia, mixed 05/12/2017   Hyperprolactinemia 06/26/2015   Insomnia 05/12/2017   Irritable bowel syndrome 08/06/2020   Jaw pain 02/13/2012   poss tmj area     Left upper quadrant pain 08/06/2020   Leukocytosis 05/01/2014   Lump of breast, left 12/20/2012   about 6 oclock    Mass of urethra 11/15/2020   Mild cognitive impairment of uncertain or unknown etiology 10/16/2020   Multinodular goiter 05/12/2017   Nocturia    Nondiabetic gastroparesis 01/11/2015   Noninvasive follicular neoplasm of thyroid with papillary-like nuclear features 10/10/2021   Palpitations 12/08/2010   Pelvic pain    Periumbilical pain 08/06/2020   Plantar fasciitis 06/11/2007   Postsurgical hypothyroidism 09/03/2021   Pruritus ani 08/06/2020   Right ear pain 02/16/2012   Social anxiety disorder    Subacute bronchitis 07/10/2018   Type II diabetes mellitus 10/10/2021   Unstable gait 07/05/2022   Urgency of urination    Vitamin B12 deficiency 09/03/2021   Vitamin D deficiency 09/23/2011    Past Surgical History:  Procedure Laterality Date   CARDIAC CATHETERIZATION  10-09-1999  DR KATZ   NORMAL LVF/  NORMAL RCA/  NO CRITICAL DISEASE LEFT CORONARY SYSTEM   CARDIOVASCULAR STRESS TEST  01-23-2011   NORMAL NUCLEAR STUDY/ NO ISCHEMIA/ EF 81%   CYSTOSCOPY WITH BIOPSY N/A 06/03/2013   Procedure: CYSTOSCOPY WITH BIOPSY  INSTILLATION OF MARCAINE AND PYRIDIUM;  Surgeon: Lindaann Slough, MD;  Location: [facility];  Service: Urology;  Laterality: N/A;   LAPAROSCOPIC CHOLECYSTECTOMY  11-08-2001   LAPAROSCOPIC LEFT SALPINGOOPHORECTOMY AND LYSYS ADHESIONS  03-10-2002   LAPAROSCOPIC REMOVAL OVARY REMNANT  2003   [place]   LAPAROSCOPY N/A 12/14/2012   Procedure: LAPAROSCOPY DIAGNOSTIC;  Surgeon: Almond Lint, MD;  Location: [facility];  Service: General;  Laterality: N/A;   THYROIDECTOMY N/A 05/24/2021   Procedure: TOTAL  THYROIDECTOMY;  Surgeon: Darnell Level, MD;  Location: [facility];  Service: General;  Laterality: N/A;   TOTAL ABDOMINAL HYSTERECTOMY  2000   W/ RIGHT SALPINGOOPHORECTOMY   TRANSTHORACIC ECHOCARDIOGRAM  10-13-2010   NORMAL LVF/  EF 55-60%   Family History:  Family History  Problem Relation Age of Onset   Other Mother        MAC infection   Anxiety disorder Mother    Dementia Mother        likely Alzheimer's disease; symptom onset on late 70s/early 80s   Sleep apnea Father    Depression Father    Alcohol abuse Father    Hypertension Father    Migraines Sister        headaches   Anxiety disorder Sister    Depression Sister    Heart disease Paternal Grandmother    Hyperlipidemia Paternal Grandmother    Alcohol abuse Paternal Grandfather    Diabetes Neg Hx    Family Psychiatric  History: Unremarkable Social History:  Social History   Substance and Sexual Activity  Alcohol Use Not Currently   Comment: occ     Social History   Substance and Sexual Activity  Drug Use Never    Social History   Socioeconomic History   Marital status: Married    Spouse name: Not on file   Number of children: 0   Years of education: 16   Highest education level: Bachelor's degree (e.g., BA, AB, BS)  Occupational History   Occupation: Disability    Comment: Nurse  Tobacco Use   Smoking status: Former    Current packs/day: 0.00    Types: Cigarettes    Start date: 1979    Quit date: 1980    Years since quitting: 44.8   Smokeless tobacco: Never   Tobacco comments:    ONLY SMOKED FOR 6 MONTHS --  QUIT  YRS AGO  Vaping Use   Vaping status: Never Used  Substance and Sexual Activity   Alcohol use: Not Currently    Comment: occ   Drug use: Never   Sexual activity: Not on file  Other Topics Concern   Not on file  Social History Narrative   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    no tobacco   Caffeine Use: very little, three times a week   Left handed    Social Determinants of Health   Financial Resource Strain: Low Risk  (05/20/2023)   Overall Financial Resource Strain (CARDIA)    Difficulty of Paying Living Expenses: Not hard at all  Food Insecurity: No Food Insecurity (06/25/2023)   Hunger Vital Sign    Worried About Running Out of Food in the Last Year: Never true    Ran Out of Food in the Last Year: Never true  Transportation Needs: No Transportation Needs (06/25/2023)   PRAPARE - Administrator, Civil Service (Medical): No    Lack of Transportation (Non-Medical):  No  Physical Activity: Insufficiently Active (05/20/2023)   Exercise Vital Sign    Days of Exercise per Week: 1 day    Minutes of Exercise per Session: 10 min  Stress: Stress Concern Present (05/20/2023)   [employer] of Occupational Health - Occupational Stress Questionnaire    Feeling of Stress : To some extent  Social Connections: Socially Isolated (05/20/2023)   Social Connection and Isolation Panel [NHANES]    Frequency of Communication with Friends and Family: Never    Frequency of Social Gatherings with Friends and Family: Once a week    Attends Religious Services: Never    Database administrator or Organizations: No    Attends Engineer, structural: Never    Marital Status: Married   Additional Social History:                         Sleep: Good  Appetite:  Good  Current Medications: Current Facility-Administered Medications  Medication Dose Route Frequency Provider Last Rate Last Admin   acetaminophen (TYLENOL) tablet 650 mg  650 mg Oral Q6H PRN Lauree Chandler, NP       albuterol (VENTOLIN HFA) 108 (90 Base) MCG/ACT inhaler 2 puff  2 puff Inhalation Q6H PRN Lauree Chandler, NP       alum & mag hydroxide-simeth (MAALOX/MYLANTA) 200-200-20 MG/5ML suspension 30 mL  30 mL Oral Q4H PRN Lauree Chandler, NP        benzonatate (TESSALON) capsule 200 mg  200 mg Oral BID PRN Sarina Ill, DO   200 mg at 06/28/23 1843   buPROPion (WELLBUTRIN XL) 24 hr tablet 150 mg  150 mg Oral Daily Sarina Ill, DO   150 mg at 06/29/23 0908   diphenhydrAMINE (BENADRYL) capsule 50 mg  50 mg Oral TID PRN Lauree Chandler, NP       Or   diphenhydrAMINE (BENADRYL) injection 50 mg  50 mg Intramuscular TID PRN Lauree Chandler, NP       divalproex (DEPAKOTE ER) 24 hr tablet 750 mg  750 mg Oral QHS Lauree Chandler, NP   750 mg at 06/28/23 2142   FLUoxetine (PROZAC) capsule 20 mg  20 mg Oral Daily Sarina Ill, DO   20 mg at 06/29/23 0908   guaiFENesin (MUCINEX) 12 hr tablet 600 mg  600 mg Oral BID Sarina Ill, DO   600 mg at 06/29/23 9147   haloperidol (HALDOL) tablet 5 mg  5 mg Oral TID PRN Lauree Chandler, NP       Or   haloperidol lactate (HALDOL) injection 5 mg  5 mg Intramuscular TID PRN Lauree Chandler, NP       hydrOXYzine (ATARAX) tablet 25 mg  25 mg Oral TID PRN Lauree Chandler, NP   25 mg at 06/27/23 2113   levothyroxine (SYNTHROID) tablet 100 mcg  100 mcg Oral Q0600 Lauree Chandler, NP   100 mcg at 06/29/23 8295   LORazepam (ATIVAN) tablet 2 mg  2 mg Oral TID PRN Lauree Chandler, NP       Or   LORazepam (ATIVAN) injection 2 mg  2 mg Intramuscular TID PRN Lauree Chandler, NP       magnesium hydroxide (MILK OF MAGNESIA) suspension 30 mL  30 mL Oral Daily PRN Lauree Chandler, NP       pravastatin (PRAVACHOL) tablet 20 mg  20 mg Oral QHS Darrick Grinder  Eun, NP   20 mg at 06/28/23 2142   risperiDONE (RISPERDAL) tablet 0.25 mg  0.25 mg Oral [id number] Sarina Ill, DO   0.25 mg at 06/29/23 0908   traZODone (DESYREL) tablet 50 mg  50 mg Oral QHS PRN Lauree Chandler, NP   50 mg at 06/27/23 2112    Lab Results: No results found for this or any previous visit (from the past 48 hour(s)).  Blood Alcohol level:  Lab Results   Component Value Date   ETH <10 06/25/2023   ETH <5 06/18/2015    Metabolic Disorder Labs: Lab Results  Component Value Date   HGBA1C 6.0 (A) 05/01/2023   MPG 102.54 08/18/2020   MPG 117 06/21/2015   Lab Results  Component Value Date   PROLACTIN 5.2 11/28/2019   PROLACTIN 112.5 (H) 06/21/2015   Lab Results  Component Value Date   CHOL 133 08/19/2022   TRIG 230.0 (H) 08/19/2022   HDL 41.60 08/19/2022   CHOLHDL 3 08/19/2022   VLDL 46.0 (H) 08/19/2022   LDLCALC 54 05/15/2017   LDLCALC 75 06/21/2015    Physical Findings: AIMS:  , ,  ,  ,    CIWA:    COWS:     Musculoskeletal: Strength & Muscle Tone: within normal limits Gait & Station: normal Patient leans: N/A  Psychiatric Specialty Exam:  Presentation  General Appearance:  Casual  Eye Contact: Good  Speech: Clear and Coherent; Slow  Speech Volume: Normal  Handedness: Right   Mood and Affect  Mood: -- ("ok")  Affect: Constricted; Flat   Thought Process  Thought Processes: Coherent; Goal Directed; Linear  Descriptions of Associations:Intact  Orientation:Full (Time, Place and Person)  Thought Content:Delusions; Paranoid Ideation  History of Schizophrenia/Schizoaffective disorder:Yes  Duration of Psychotic Symptoms:Greater than six months  Hallucinations:No data recorded Ideas of Reference:Delusions; Paranoia  Suicidal Thoughts:No data recorded Homicidal Thoughts:No data recorded  Sensorium  Memory: Immediate Fair  Judgment: Poor  Insight: Poor   Executive Functions  Concentration: Fair  Attention Span: Fair  Recall: [employer] of Knowledge: Fair  Language: Fair   Psychomotor Activity  Psychomotor Activity:No data recorded  Assets  Assets: Communication Skills; Financial Resources/Insurance; Housing; Resilience; Social Support   Sleep  Sleep:No data recorded   MENTAL STATUS EXAM: Patient is alert and oriented x 3, pleasant and cooperative, good  eye contact, speech is normal and not pressured, mood is depressed; affect is flat; thought process: goal directed; thought content: Denies suicidal ideation; judgment is fair insight is fair. Blood pressure 127/69, pulse 83, temperature 98.5 F (36.9 C), resp. rate 15, height 5' (1.524 m), weight 63.5 kg, SpO2 94%. Body mass index is 27.34 kg/m.   Treatment Plan Summary: Daily contact with patient to assess and evaluate symptoms and progress in treatment, Medication management, and Plan continue current medications.  Sarina Ill, DO 06/29/2023, 12:55 PM

## 2023-06-29 NOTE — Progress Notes (Signed)
   06/29/23 1700  Psych Admission Type (Psych Patients Only)  Admission Status Voluntary  Psychosocial Assessment  Patient Complaints Anxiety  Eye Contact Brief  Facial Expression Anxious  Affect Anxious  Speech Logical/coherent  Interaction Assertive  Motor Activity Slow  Appearance/Hygiene In scrubs  Behavior Characteristics Cooperative  Mood Anxious  Thought Process  Coherency WDL  Content WDL  Delusions None reported or observed  Perception WDL  Hallucination None reported or observed  Judgment Impaired  Confusion None  Danger to Self  Current suicidal ideation? Denies  Danger to Others  Danger to Others None reported or observed

## 2023-06-29 NOTE — Group Note (Signed)
Date:  06/29/2023 Time:  11:39 AM  Group Topic/Focus:  Emotional Education:   The focus of this group is to discuss what feelings/emotions are, and how they are experienced.    Participation Level:  Active  Participation Quality:  Appropriate  Affect:  Appropriate  Cognitive:  Appropriate  Insight: Appropriate  Engagement in Group:  Engaged  Modes of Intervention:  Discussion   Ardelle Anton 06/29/2023, 11:39 AM

## 2023-06-29 NOTE — BHH Counselor (Signed)
CSW contacted [name], [phone] pt's wife to complete SPE.   Pt's wife reports she feels the pt came to the hospital because she had a chest cold and that caused her not to get any sleep.   Reports that pt had a chest cold, went to the doctor, took an antibiotic,and that the coughing still did not go away because pt wasn't getting deep sleep because pt kept coughing.   [name] reports she has never seen pt have a manic episode, she reports her last one was in 2015 before they were together.   [name] reports she manages pt's medication so pt does not have access to her meds.   [name] reports she gives pt medication at home because pt has the cognitive impairment which caused her to overdose on medicine because pt forgot if she took it or not.   [name] reports n concerns about pt returning home. [name] reports that she is going to be starting a job where she's working from home so she can be with pt. Reports she needs notice before pt discharges in order to give her time to take off from work.   Reports that her son is still living with them. [name] reports that her son has a Sales promotion account executive and that they do not go down stairs.   CSW asks if weapon can be secured before pt's discharge.   [name] reports she can get him to take it over to his father's before pt is discharged.   [name] reports she can take off work Friday to support pt's release and discharge planning.   CSW will confirm this date with rest of care team.   Reynaldo Minium, MSW, [facility] 06/29/2023 1:25 PM

## 2023-06-29 NOTE — Progress Notes (Signed)
   06/29/23 2300  Psych Admission Type (Psych Patients Only)  Admission Status Voluntary  Psychosocial Assessment  Patient Complaints None  Eye Contact Brief  Facial Expression Animated  Affect Appropriate to circumstance  Speech Logical/coherent  Interaction Assertive  Motor Activity Slow  Appearance/Hygiene In scrubs  Behavior Characteristics Cooperative;Appropriate to situation  Mood Pleasant  Thought Process  Coherency WDL  Content WDL  Delusions None reported or observed  Perception WDL  Hallucination None reported or observed  Judgment Impaired  Confusion None  Danger to Self  Current suicidal ideation? Denies  Agreement Not to Harm Self Yes  Description of Agreement Verbal  Danger to Others  Danger to Others None reported or observed

## 2023-06-29 NOTE — Group Note (Signed)
Date:  06/29/2023 Time:  11:20 PM  Group Topic/Focus:  Wellness Toolbox:   The focus of this group is to discuss various aspects of wellness, balancing those aspects and exploring ways to increase the ability to experience wellness.  Patients will create a wellness toolbox for use upon discharge.    Participation Level:  Did Not Attend  Participation Quality:      Affect:      Cognitive:      Insight: None  Engagement in Group:  None  Modes of Intervention:      Additional Comments:    Maeola Harman 06/29/2023, 11:20 PM

## 2023-06-29 NOTE — Plan of Care (Signed)

## 2023-06-29 NOTE — Progress Notes (Signed)
PT Cancellation Note  Patient Details Name: [name] MRN: [id number] DOB: May 02, 1961   Cancelled Treatment:    Reason Eval/Treat Not Completed: Other (comment)  Pt in bathroom with loose stools.  She is able to provide for her own self care and transitions off commode.  Tech stated pt is walking on her own on unit but does need help on occasion to stand.  Pt declined intervention at this time and requested return tomorrow.  Will accommodate as able.   Danielle Dess 06/29/2023, 1:42 PM

## 2023-06-30 DIAGNOSIS — F251 Schizoaffective disorder, depressive type: Secondary | ICD-10-CM | POA: Diagnosis not present

## 2023-06-30 NOTE — Group Note (Signed)
LCSW Group Therapy Note  Group Date: 06/30/2023 Start Time: 1330 End Time: 1415   Type of Therapy and Topic:  Group Therapy - Healthy vs Unhealthy Coping Skills  Participation Level:  Did Not Attend   Description of Group The focus of this group was to determine what unhealthy coping techniques typically are used by group members and what healthy coping techniques would be helpful in coping with various problems. Patients were guided in becoming aware of the differences between healthy and unhealthy coping techniques. Patients were asked to identify 2-3 healthy coping skills they would like to learn to use more effectively.  Therapeutic Goals Patients learned that coping is what human beings do all day long to deal with various situations in their lives Patients defined and discussed healthy vs unhealthy coping techniques Patients identified their preferred coping techniques and identified whether these were healthy or unhealthy Patients determined 2-3 healthy coping skills they would like to become more familiar with and use more often. Patients provided support and ideas to each other   Summary of Patient Progress:  X   Therapeutic Modalities Cognitive Behavioral Therapy Motivational Interviewing  Elza Rafter, [place] 06/30/2023  2:35 PM

## 2023-06-30 NOTE — Group Note (Signed)
Recreation Therapy Group Note   Group Topic:General Recreation  Group Date: 06/30/2023 Start Time: 1410 End Time: 1500 Facilitators: Rosina Lowenstein, LRT, CTRS Location: Courtyard  Group Description: Outdoor Recreation. Patients had the option to play corn hole, ring toss, bowling or listening to music while outside in the courtyard getting fresh air and sunlight. [name] Kitchen LRT and patients discussed things that they enjoy doing in their free time outside of the hospital. LRT encouraged patients to drink water after being active and getting their heart rate up.    Goal Area(s) Addressed: Patient will identify leisure interests.  Patient will practice healthy decision making. Patient will engage in recreation activity.   Affect/Mood: N/A   Participation Level: Did not attend    Clinical Observations/Individualized Feedback: [name] did not attend group.   Plan: Continue to engage patient in RT group sessions 2-3x/week.   Rosina Lowenstein, LRT, CTRS 06/30/2023 3:41 PM

## 2023-06-30 NOTE — Progress Notes (Signed)
Physical Therapy Treatment Patient Details Name: [name] MRN: [id number] DOB: 30-Sep-1960 Today's Date: 06/30/2023   History of Present Illness Patient is a 62 year old female who was involuntarily admitted to inpatient psychiatry for depression. Current MD assessment: depression and anxiety.    PT Comments  Pt in day room and agrees to session.  Session focused on transfers from different heights and surfaces and bed mobility.  She overall has progressed well on unit and is walking independently with RW.  She stated she uses a SPC as needed at baseline but would benefit from RW upon discharge home.  She does need cues and education for hand placements for sit to stand transfers.  She has good carryover during session and is more consistent with pushing off surface to stand.  Tech reports they still need to help her out of bed at times which seems to be more her trying to pull up off walker and is seated too far back on short bed.  When she pushes off bed and sits more towards the edge she is able to do it on her own successfully and safely.  Pt generally fatigued with session.  Discussed with nurse tech to encourage proper hand placements if she calls for assist.     If plan is discharge home, recommend the following: A little help with walking and/or transfers;A little help with bathing/dressing/bathroom;Assist for transportation;Help with stairs or ramp for entrance;Assistance with cooking/housework   Can travel by Training and development officer (2 wheels)    Recommendations for Other Services       Precautions / Restrictions Precautions Precautions: Fall Restrictions Weight Bearing Restrictions: No     Mobility  Bed Mobility Overal bed mobility: Modified Independent               Patient Response: Cooperative  Transfers Overall transfer level: Needs assistance Equipment used: Rolling walker (2 wheels) Transfers: Sit to/from  Stand Sit to Stand: Supervision           General transfer comment: cues for hand placements and safety    Ambulation/Gait Ambulation/Gait assistance: Supervision Gait Distance (Feet): 200 Feet Assistive device: Rolling walker (2 wheels) Gait Pattern/deviations: Step-through pattern, Decreased step length - right, Decreased step length - left, Narrow base of support Gait velocity: decreased         Stairs             Wheelchair Mobility     Tilt Bed Tilt Bed Patient Response: Cooperative  Modified Rankin (Stroke Patients Only)       Balance Overall balance assessment: Needs assistance Sitting-balance support: Feet supported Sitting balance-Leahy Scale: Good     Standing balance support: Bilateral upper extremity supported Standing balance-Leahy Scale: Good                              Cognition Arousal: Alert Behavior During Therapy: WFL for tasks assessed/performed Overall Cognitive Status: Within Functional Limits for tasks assessed                                          Exercises      General Comments        Pertinent Vitals/Pain Pain Assessment Pain Assessment: No/denies pain    Home Living  Prior Function            PT Goals (current goals can now be found in the care plan section) Progress towards PT goals: Progressing toward goals    Frequency    Min 1X/week      PT Plan      Co-evaluation              AM-PAC PT "6 Clicks" Mobility   Outcome Measure  Help needed turning from your back to your side while in a flat bed without using bedrails?: None Help needed moving from lying on your back to sitting on the side of a flat bed without using bedrails?: None Help needed moving to and from a bed to a chair (including a wheelchair)?: None Help needed standing up from a chair using your arms (e.g., wheelchair or bedside chair)?: None Help needed to  walk in hospital room?: None Help needed climbing 3-5 steps with a railing? : A Little 6 Click Score: 23    End of Session Equipment Utilized During Treatment: Gait belt Activity Tolerance: Patient tolerated treatment well Patient left: in bed Nurse Communication: Mobility status PT Visit Diagnosis: Muscle weakness (generalized) (M62.81);Other abnormalities of gait and mobility (R26.89);Difficulty in walking, not elsewhere classified (R26.2)     Time: 1610-9604 PT Time Calculation (min) (ACUTE ONLY): 28 min  Charges:    $Gait Training: 8-22 mins $Therapeutic Activity: 8-22 mins PT General Charges $$ ACUTE PT VISIT: 1 Visit                   Danielle Dess, PTA 06/30/23, 9:49 AM

## 2023-06-30 NOTE — Progress Notes (Signed)
   06/30/23 0615  15 Minute Checks  Location Bedroom  Visual Appearance Calm  Behavior Sleeping  Sleep (Behavioral Health Patients Only)  Calculate sleep? (Click Yes once per 24 hr at 0600 safety check) Yes  Documented sleep last 24 hours 7.25

## 2023-06-30 NOTE — Progress Notes (Signed)
   06/30/23 0718  Psych Admission Type (Psych Patients Only)  Admission Status Voluntary  Psychosocial Assessment  Patient Complaints None  Eye Contact Brief  Facial Expression Anxious  Affect Anxious  Speech Logical/coherent  Interaction Assertive  Motor Activity Slow  Appearance/Hygiene In scrubs  Behavior Characteristics Cooperative;Calm  Mood Pleasant  Thought Process  Coherency WDL  Content WDL  Delusions None reported or observed  Perception WDL  Hallucination None reported or observed  Judgment Impaired  Confusion None  Danger to Self  Current suicidal ideation? Denies  Danger to Others  Danger to Others None reported or observed

## 2023-06-30 NOTE — Progress Notes (Signed)
[facility] MD Progress Note  06/30/2023 8:36 PM [name]  MRN:  [id number]   [name] is a 62 year old white female who was involuntarily admitted to inpatient psychiatry for depression. She reports being upset on election and has not been sleeping.   Subjective: Chart reviewed, case discussed in multidisciplinary meeting today, patient seen during rounds.  Patient reports she is doing better. Sleep and appetite are improved.  No new acute events reported by patient or staff overnight.  Patient reports she has been attending groups.  Patient was encouraged to work on coping strategies and a safe discharge plan.   Principal Problem: Schizoaffective disorder (HCC) Diagnosis: Principal Problem:   Schizoaffective disorder (HCC)   Past Psychiatric History: Extensive history of depression, cluster B traits, and bipolar disorder   Past Medical History:  Past Medical History:  Diagnosis Date   Abnormal LFTs 04/06/2013   Very minor tried to reassure okay to repeat today with hepatitis C screen   Allergic rhinitis 03/26/2007   Autonomic dysfunction 01/11/2015   Bipolar 1 disorder    Hx of psychosis; present since age 23   Chest pain on breathing 03/16/2014   Chronic fatigue 06/21/2017   Chronic interstitial cystitis 11/15/2020   Community acquired pneumonia of right lung 08/17/2020   Complication of anesthesia    agitation   Constipation 05/17/2012   Costochondritis 02/14/2014   Diverticular disease of colon 08/06/2020   Double vision 03/15/2011   Dyspnea on effort 01/10/2011   Dysuria 02/15/2013   Early satiety 04/27/2013   Elevated lactic acid level    Endometriosis 05/11/2008   Fatty liver disease, nonalcoholic 02/13/2014   Fecal urgency 08/06/2020   Female stress incontinence 11/15/2020   Food aversion 11/08/2012   Frequency of urination    Gastroesophageal reflux disease 08/06/2020   Headache    History of duodenal ulcer    History of hysterectomy 11/15/2020   History of  kidney stones    History of lithium toxicity 05/01/2014   Hoarseness of voice 02/27/2016   Hyperglycemia 01/10/2011   Hyperlipidemia, mixed 05/12/2017   Hyperprolactinemia 06/26/2015   Insomnia 05/12/2017   Irritable bowel syndrome 08/06/2020   Jaw pain 02/13/2012   poss tmj area     Left upper quadrant pain 08/06/2020   Leukocytosis 05/01/2014   Lump of breast, left 12/20/2012   about 6 oclock    Mass of urethra 11/15/2020   Mild cognitive impairment of uncertain or unknown etiology 10/16/2020   Multinodular goiter 05/12/2017   Nocturia    Nondiabetic gastroparesis 01/11/2015   Noninvasive follicular neoplasm of thyroid with papillary-like nuclear features 10/10/2021   Palpitations 12/08/2010   Pelvic pain    Periumbilical pain 08/06/2020   Plantar fasciitis 06/11/2007   Postsurgical hypothyroidism 09/03/2021   Pruritus ani 08/06/2020   Right ear pain 02/16/2012   Social anxiety disorder    Subacute bronchitis 07/10/2018   Type II diabetes mellitus 10/10/2021   Unstable gait 07/05/2022   Urgency of urination    Vitamin B12 deficiency 09/03/2021   Vitamin D deficiency 09/23/2011    Past Surgical History:  Procedure Laterality Date   CARDIAC CATHETERIZATION  10-09-1999  DR KATZ   NORMAL LVF/  NORMAL RCA/  NO CRITICAL DISEASE LEFT CORONARY SYSTEM   CARDIOVASCULAR STRESS TEST  01-23-2011   NORMAL NUCLEAR STUDY/ NO ISCHEMIA/ EF 81%   CYSTOSCOPY WITH BIOPSY N/A 06/03/2013   Procedure: CYSTOSCOPY WITH BIOPSY  INSTILLATION OF MARCAINE AND PYRIDIUM;  Surgeon: Lindaann Slough, MD;  Location: [facility];  Service: Urology;  Laterality: N/A;   LAPAROSCOPIC CHOLECYSTECTOMY  11-08-2001   LAPAROSCOPIC LEFT SALPINGOOPHORECTOMY AND LYSYS ADHESIONS  03-10-2002   LAPAROSCOPIC REMOVAL OVARY REMNANT  2003   [place]   LAPAROSCOPY N/A 12/14/2012   Procedure: LAPAROSCOPY DIAGNOSTIC;  Surgeon: Almond Lint, MD;  Location: [facility];  Service: General;  Laterality: N/A;    THYROIDECTOMY N/A 05/24/2021   Procedure: TOTAL THYROIDECTOMY;  Surgeon: Darnell Level, MD;  Location: [facility];  Service: General;  Laterality: N/A;   TOTAL ABDOMINAL HYSTERECTOMY  2000   W/ RIGHT SALPINGOOPHORECTOMY   TRANSTHORACIC ECHOCARDIOGRAM  10-13-2010   NORMAL LVF/  EF 55-60%   Family History:  Family History  Problem Relation Age of Onset   Other Mother        MAC infection   Anxiety disorder Mother    Dementia Mother        likely Alzheimer's disease; symptom onset on late 70s/early 80s   Sleep apnea Father    Depression Father    Alcohol abuse Father    Hypertension Father    Migraines Sister        headaches   Anxiety disorder Sister    Depression Sister    Heart disease Paternal Grandmother    Hyperlipidemia Paternal Grandmother    Alcohol abuse Paternal Grandfather    Diabetes Neg Hx    Social History:  Social History   Substance and Sexual Activity  Alcohol Use Not Currently   Comment: occ     Social History   Substance and Sexual Activity  Drug Use Never    Social History   Socioeconomic History   Marital status: Married    Spouse name: Not on file   Number of children: 0   Years of education: 16   Highest education level: Bachelor's degree (e.g., BA, AB, BS)  Occupational History   Occupation: Disability    Comment: Nurse  Tobacco Use   Smoking status: Former    Current packs/day: 0.00    Types: Cigarettes    Start date: 1979    Quit date: 1980    Years since quitting: 44.8   Smokeless tobacco: Never   Tobacco comments:    ONLY SMOKED FOR 6 MONTHS --  QUIT  YRS AGO  Vaping Use   Vaping status: Never Used  Substance and Sexual Activity   Alcohol use: Not Currently    Comment: occ   Drug use: Never   Sexual activity: Not on file  Other Topics Concern   Not on file  Social History Narrative   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    no tobacco   Caffeine Use: very little, three times a week   Left handed    Social Determinants of Health   Financial Resource Strain: Low Risk  (05/20/2023)   Overall Financial Resource Strain (CARDIA)    Difficulty of Paying Living Expenses: Not hard at all  Food Insecurity: No Food Insecurity (06/25/2023)   Hunger Vital Sign    Worried About Running Out of Food in the Last Year: Never true    Ran Out of Food in the Last Year: Never true  Transportation Needs: No Transportation Needs (06/25/2023)   PRAPARE - Administrator, Civil Service (Medical): No    Lack of Transportation (Non-Medical): No  Physical Activity: Insufficiently Active (  05/20/2023)   Exercise Vital Sign    Days of Exercise per Week: 1 day    Minutes of Exercise per Session: 10 min  Stress: Stress Concern Present (05/20/2023)   [employer] of Occupational Health - Occupational Stress Questionnaire    Feeling of Stress : To some extent  Social Connections: Socially Isolated (05/20/2023)   Social Connection and Isolation Panel [NHANES]    Frequency of Communication with Friends and Family: Never    Frequency of Social Gatherings with Friends and Family: Once a week    Attends Religious Services: Never    Database administrator or Organizations: No    Attends Engineer, structural: Never    Marital Status: Married                          Sleep: Fair  Appetite:  Fair  Current Medications: Current Facility-Administered Medications  Medication Dose Route Frequency Provider Last Rate Last Admin   acetaminophen (TYLENOL) tablet 650 mg  650 mg Oral Q6H PRN Lauree Chandler, NP       albuterol (VENTOLIN HFA) 108 (90 Base) MCG/ACT inhaler 2 puff  2 puff Inhalation Q6H PRN Lauree Chandler, NP       alum & mag hydroxide-simeth (MAALOX/MYLANTA) 200-200-20 MG/5ML suspension 30 mL  30 mL Oral Q4H PRN Lauree Chandler, NP       benzonatate (TESSALON)  capsule 200 mg  200 mg Oral BID PRN Sarina Ill, DO   200 mg at 06/30/23 1640   buPROPion (WELLBUTRIN XL) 24 hr tablet 150 mg  150 mg Oral Daily Sarina Ill, DO   150 mg at 06/30/23 0841   diphenhydrAMINE (BENADRYL) capsule 50 mg  50 mg Oral TID PRN Lauree Chandler, NP       Or   diphenhydrAMINE (BENADRYL) injection 50 mg  50 mg Intramuscular TID PRN Lauree Chandler, NP       divalproex (DEPAKOTE ER) 24 hr tablet 750 mg  750 mg Oral QHS Lauree Chandler, NP   750 mg at 06/29/23 2137   FLUoxetine (PROZAC) capsule 20 mg  20 mg Oral Daily Sarina Ill, DO   20 mg at 06/30/23 0841   guaiFENesin (MUCINEX) 12 hr tablet 600 mg  600 mg Oral BID Sarina Ill, DO   600 mg at 06/30/23 0841   haloperidol (HALDOL) tablet 5 mg  5 mg Oral TID PRN Lauree Chandler, NP       Or   haloperidol lactate (HALDOL) injection 5 mg  5 mg Intramuscular TID PRN Lauree Chandler, NP       hydrOXYzine (ATARAX) tablet 25 mg  25 mg Oral TID PRN Lauree Chandler, NP   25 mg at 06/27/23 2113   levothyroxine (SYNTHROID) tablet 100 mcg  100 mcg Oral Q0600 Lauree Chandler, NP   100 mcg at 06/30/23 1610   LORazepam (ATIVAN) tablet 2 mg  2 mg Oral TID PRN Lauree Chandler, NP       Or   LORazepam (ATIVAN) injection 2 mg  2 mg Intramuscular TID PRN Lauree Chandler, NP       magnesium hydroxide (MILK OF MAGNESIA) suspension 30 mL  30 mL Oral Daily PRN Lauree Chandler, NP       pravastatin (PRAVACHOL) tablet 20 mg  20 mg Oral QHS Lauree Chandler, NP   20 mg at 06/29/23 2137  risperiDONE (RISPERDAL) tablet 0.25 mg  0.25 mg Oral [id number] Sarina Ill, DO   0.25 mg at 06/30/23 1639   traZODone (DESYREL) tablet 50 mg  50 mg Oral QHS PRN Lauree Chandler, NP   50 mg at 06/27/23 2112    Lab Results: No results found for this or any previous visit (from the past 48 hour(s)).  Blood Alcohol level:  Lab Results  Component Value Date   ETH  <10 06/25/2023   ETH <5 06/18/2015    Metabolic Disorder Labs: Lab Results  Component Value Date   HGBA1C 6.0 (A) 05/01/2023   MPG 102.54 08/18/2020   MPG 117 06/21/2015   Lab Results  Component Value Date   PROLACTIN 5.2 11/28/2019   PROLACTIN 112.5 (H) 06/21/2015   Lab Results  Component Value Date   CHOL 133 08/19/2022   TRIG 230.0 (H) 08/19/2022   HDL 41.60 08/19/2022   CHOLHDL 3 08/19/2022   VLDL 46.0 (H) 08/19/2022   LDLCALC 54 05/15/2017   LDLCALC 75 06/21/2015    Physical Findings: AIMS:  , ,  ,  ,    CIWA:    COWS:     Musculoskeletal: Strength & Muscle Tone: within normal limits Gait & Station: normal Patient leans: N/A  Psychiatric Specialty Exam:  Presentation  General Appearance:  Casual  Eye Contact: Good  Speech: Spontaneous  Speech Volume: Normal  Handedness: Right   Mood and Affect  Mood: Good  Affect: Stable   Thought Process  Thought Processes: Coherent; Goal Directed; Linear  Descriptions of Associations:Intact  Orientation:Full (Time, Place and Person)  Thought Content:Improved History of Schizophrenia/Schizoaffective disorder:Yes  Duration of Psychotic Symptoms:Greater than six months  Hallucinations:Denies Ideas of Reference:None  Suicidal Thoughts:Denies Homicidal Thoughts:Denies  Sensorium  Memory: Immediate Fair  Judgment: Improved  Insight: Improved   Executive Functions  Concentration: Fair  Attention Span: Fair   Fund of Knowledge: Fair  Language: Fair   Psychomotor Activity  Psychomotor Activity:Normal  Assets  Assets: Manufacturing systems engineer; Financial Resources/Insurance; Housing; Resilience; Social Support   Sleep  Sleep:Fair   Physical Exam: Physical Exam Constitutional:      Appearance: Normal appearance.  HENT:     Head: Normocephalic and atraumatic.     Nose: Nose normal.  Eyes:     Pupils: Pupils are equal, round, and reactive to light.  Cardiovascular:      Rate and Rhythm: Regular rhythm.  Pulmonary:     Effort: Pulmonary effort is normal.  Skin:    General: Skin is warm.  Neurological:     Mental Status: She is alert and oriented to person, place, and time.    Review of Systems  Constitutional:  Negative for fever.  HENT:  Negative for congestion and hearing loss.   Eyes:  Negative for blurred vision and double vision.  Respiratory:  Negative for cough and shortness of breath.   Cardiovascular:  Negative for chest pain and palpitations.  Gastrointestinal:  Negative for nausea and vomiting.  Neurological:  Negative for dizziness and speech change.   Blood pressure (!) 110/55, pulse 80, temperature (!) 97.4 F (36.3 C), resp. rate 14, height 5' (1.524 m), weight 63.5 kg, SpO2 99%. Body mass index is 27.34 kg/m.   Treatment Plan Summary: Daily contact with patient to assess and evaluate symptoms and progress in treatment and Medication management  Lewanda Rife, MD 06/30/2023, 8:36 PM

## 2023-07-01 ENCOUNTER — Ambulatory Visit: Payer: Medicare PPO | Admitting: Internal Medicine

## 2023-07-01 DIAGNOSIS — F251 Schizoaffective disorder, depressive type: Secondary | ICD-10-CM | POA: Diagnosis not present

## 2023-07-01 NOTE — [facility] Counselor (Signed)
CSW contacted [name] [phone]) pt's wife to confirm discharge.   Pt's wife reports she can pick pt up tomorrow evening and that she would like pt's medications to be sent to the [facility] [place] on [address].   CSW will inform provider and nursing staff  Reynaldo Minium, MSW, [facility] 07/01/2023 10:26 AM

## 2023-07-01 NOTE — Plan of Care (Signed)
  Problem: Pain Management: Goal: General experience of comfort will improve Outcome: Progressing   Problem: Safety: Goal: Ability to remain free from injury will improve Outcome: Progressing

## 2023-07-01 NOTE — Progress Notes (Signed)
[facility] MD Progress Note  07/01/2023 7:45 PM [name]  MRN:  [id number]   [name] is a 61 year old white female who was involuntarily admitted to inpatient psychiatry for depression. She reports being upset on election and has not been sleeping.   Subjective: Chart reviewed, case discussed in multidisciplinary meeting today, patient seen during rounds.  No new acute events reported by patient or staff overnight.  Patient reports she feels good today.  She reports she slept good last night.  Her appetite is improved.  Patient reports she has been attending groups.  Patient was encouraged to work on coping strategies and a safe discharge plan.  Patient reports she feels close to her baseline.  She denies thoughts of harming herself or others.   Principal Problem: Schizoaffective disorder (HCC) Diagnosis: Principal Problem:   Schizoaffective disorder (HCC)   Past Psychiatric History: Extensive history of depression, cluster B traits, and bipolar disorder   Past Medical History:  Past Medical History:  Diagnosis Date   Abnormal LFTs 04/06/2013   Very minor tried to reassure okay to repeat today with hepatitis C screen   Allergic rhinitis 03/26/2007   Autonomic dysfunction 01/11/2015   Bipolar 1 disorder    Hx of psychosis; present since age 76   Chest pain on breathing 03/16/2014   Chronic fatigue 06/21/2017   Chronic interstitial cystitis 11/15/2020   Community acquired pneumonia of right lung 08/17/2020   Complication of anesthesia    agitation   Constipation 05/17/2012   Costochondritis 02/14/2014   Diverticular disease of colon 08/06/2020   Double vision 03/15/2011   Dyspnea on effort 01/10/2011   Dysuria 02/15/2013   Early satiety 04/27/2013   Elevated lactic acid level    Endometriosis 05/11/2008   Fatty liver disease, nonalcoholic 02/13/2014   Fecal urgency 08/06/2020   Female stress incontinence 11/15/2020   Food aversion 11/08/2012   Frequency of urination     Gastroesophageal reflux disease 08/06/2020   Headache    History of duodenal ulcer    History of hysterectomy 11/15/2020   History of kidney stones    History of lithium toxicity 05/01/2014   Hoarseness of voice 02/27/2016   Hyperglycemia 01/10/2011   Hyperlipidemia, mixed 05/12/2017   Hyperprolactinemia 06/26/2015   Insomnia 05/12/2017   Irritable bowel syndrome 08/06/2020   Jaw pain 02/13/2012   poss tmj area     Left upper quadrant pain 08/06/2020   Leukocytosis 05/01/2014   Lump of breast, left 12/20/2012   about 6 oclock    Mass of urethra 11/15/2020   Mild cognitive impairment of uncertain or unknown etiology 10/16/2020   Multinodular goiter 05/12/2017   Nocturia    Nondiabetic gastroparesis 01/11/2015   Noninvasive follicular neoplasm of thyroid with papillary-like nuclear features 10/10/2021   Palpitations 12/08/2010   Pelvic pain    Periumbilical pain 08/06/2020   Plantar fasciitis 06/11/2007   Postsurgical hypothyroidism 09/03/2021   Pruritus ani 08/06/2020   Right ear pain 02/16/2012   Social anxiety disorder    Subacute bronchitis 07/10/2018   Type II diabetes mellitus 10/10/2021   Unstable gait 07/05/2022   Urgency of urination    Vitamin B12 deficiency 09/03/2021   Vitamin D deficiency 09/23/2011    Past Surgical History:  Procedure Laterality Date   CARDIAC CATHETERIZATION  10-09-1999  DR KATZ   NORMAL LVF/  NORMAL RCA/  NO CRITICAL DISEASE LEFT CORONARY SYSTEM   CARDIOVASCULAR STRESS TEST  01-23-2011   NORMAL NUCLEAR STUDY/ NO ISCHEMIA/ EF 81%  CYSTOSCOPY WITH BIOPSY N/A 06/03/2013   Procedure: CYSTOSCOPY WITH BIOPSY  INSTILLATION OF MARCAINE AND PYRIDIUM;  Surgeon: Lindaann Slough, MD;  Location: [facility];  Service: Urology;  Laterality: N/A;   LAPAROSCOPIC CHOLECYSTECTOMY  11-08-2001   LAPAROSCOPIC LEFT SALPINGOOPHORECTOMY AND LYSYS ADHESIONS  03-10-2002   LAPAROSCOPIC REMOVAL OVARY REMNANT  2003   [place]   LAPAROSCOPY N/A  12/14/2012   Procedure: LAPAROSCOPY DIAGNOSTIC;  Surgeon: Almond Lint, MD;  Location: [facility];  Service: General;  Laterality: N/A;   THYROIDECTOMY N/A 05/24/2021   Procedure: TOTAL THYROIDECTOMY;  Surgeon: Darnell Level, MD;  Location: [facility];  Service: General;  Laterality: N/A;   TOTAL ABDOMINAL HYSTERECTOMY  2000   W/ RIGHT SALPINGOOPHORECTOMY   TRANSTHORACIC ECHOCARDIOGRAM  10-13-2010   NORMAL LVF/  EF 55-60%   Family History:  Family History  Problem Relation Age of Onset   Other Mother        MAC infection   Anxiety disorder Mother    Dementia Mother        likely Alzheimer's disease; symptom onset on late 70s/early 80s   Sleep apnea Father    Depression Father    Alcohol abuse Father    Hypertension Father    Migraines Sister        headaches   Anxiety disorder Sister    Depression Sister    Heart disease Paternal Grandmother    Hyperlipidemia Paternal Grandmother    Alcohol abuse Paternal Grandfather    Diabetes Neg Hx    Social History:  Social History   Substance and Sexual Activity  Alcohol Use Not Currently   Comment: occ     Social History   Substance and Sexual Activity  Drug Use Never    Social History   Socioeconomic History   Marital status: Married    Spouse name: Not on file   Number of children: 0   Years of education: 16   Highest education level: Bachelor's degree (e.g., BA, AB, BS)  Occupational History   Occupation: Disability    Comment: Nurse  Tobacco Use   Smoking status: Former    Current packs/day: 0.00    Types: Cigarettes    Start date: 1979    Quit date: 1980    Years since quitting: 44.8   Smokeless tobacco: Never   Tobacco comments:    ONLY SMOKED FOR 6 MONTHS --  QUIT  YRS AGO  Vaping Use   Vaping status: Never Used  Substance and Sexual Activity   Alcohol use: Not Currently    Comment: occ   Drug use: Never   Sexual activity: Not on file  Other Topics Concern   Not on file  Social History Narrative   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    no tobacco   Caffeine Use: very little, three times a week   Left handed    Social Determinants of Health   Financial Resource Strain: Low Risk  (05/20/2023)   Overall Financial Resource Strain (CARDIA)    Difficulty of Paying Living Expenses: Not hard at all  Food Insecurity: No Food Insecurity (06/25/2023)   Hunger Vital Sign    Worried About Running Out of Food in the Last Year: Never true    Ran Out of Food in the Last Year: Never true  Transportation Needs: No Transportation Needs (06/25/2023)  PRAPARE - Administrator, Civil Service (Medical): No    Lack of Transportation (Non-Medical): No  Physical Activity: Insufficiently Active (05/20/2023)   Exercise Vital Sign    Days of Exercise per Week: 1 day    Minutes of Exercise per Session: 10 min  Stress: Stress Concern Present (05/20/2023)   [employer] of Occupational Health - Occupational Stress Questionnaire    Feeling of Stress : To some extent  Social Connections: Socially Isolated (05/20/2023)   Social Connection and Isolation Panel [NHANES]    Frequency of Communication with Friends and Family: Never    Frequency of Social Gatherings with Friends and Family: Once a week    Attends Religious Services: Never    Database administrator or Organizations: No    Attends Engineer, structural: Never    Marital Status: Married                          Sleep: Fair  Appetite:  Fair  Current Medications: Current Facility-Administered Medications  Medication Dose Route Frequency Provider Last Rate Last Admin   acetaminophen (TYLENOL) tablet 650 mg  650 mg Oral Q6H PRN Lauree Chandler, NP       albuterol (VENTOLIN HFA) 108 (90 Base) MCG/ACT inhaler 2 puff  2 puff Inhalation Q6H PRN Lauree Chandler, NP       alum & mag  hydroxide-simeth (MAALOX/MYLANTA) 200-200-20 MG/5ML suspension 30 mL  30 mL Oral Q4H PRN Lauree Chandler, NP       benzonatate (TESSALON) capsule 200 mg  200 mg Oral BID PRN Sarina Ill, DO   200 mg at 06/30/23 2124   buPROPion (WELLBUTRIN XL) 24 hr tablet 150 mg  150 mg Oral Daily Sarina Ill, DO   150 mg at 07/01/23 1013   diphenhydrAMINE (BENADRYL) capsule 50 mg  50 mg Oral TID PRN Lauree Chandler, NP       Or   diphenhydrAMINE (BENADRYL) injection 50 mg  50 mg Intramuscular TID PRN Lauree Chandler, NP       divalproex (DEPAKOTE ER) 24 hr tablet 750 mg  750 mg Oral QHS Lauree Chandler, NP   750 mg at 06/30/23 2124   FLUoxetine (PROZAC) capsule 20 mg  20 mg Oral Daily Sarina Ill, DO   20 mg at 07/01/23 1013   guaiFENesin (MUCINEX) 12 hr tablet 600 mg  600 mg Oral BID Sarina Ill, DO   600 mg at 07/01/23 1013   haloperidol (HALDOL) tablet 5 mg  5 mg Oral TID PRN Lauree Chandler, NP       Or   haloperidol lactate (HALDOL) injection 5 mg  5 mg Intramuscular TID PRN Lauree Chandler, NP       hydrOXYzine (ATARAX) tablet 25 mg  25 mg Oral TID PRN Lauree Chandler, NP   25 mg at 06/27/23 2113   levothyroxine (SYNTHROID) tablet 100 mcg  100 mcg Oral Q0600 Lauree Chandler, NP   100 mcg at 07/01/23 1610   LORazepam (ATIVAN) tablet 2 mg  2 mg Oral TID PRN Lauree Chandler, NP       Or   LORazepam (ATIVAN) injection 2 mg  2 mg Intramuscular TID PRN Lauree Chandler, NP       magnesium hydroxide (MILK OF MAGNESIA) suspension 30 mL  30 mL Oral Daily PRN Lauree Chandler, NP  pravastatin (PRAVACHOL) tablet 20 mg  20 mg Oral QHS Lauree Chandler, NP   20 mg at 06/30/23 2124   risperiDONE (RISPERDAL) tablet 0.25 mg  0.25 mg Oral [id number] Sarina Ill, DO   0.25 mg at 07/01/23 1556   traZODone (DESYREL) tablet 50 mg  50 mg Oral QHS PRN Lauree Chandler, NP   50 mg at 06/30/23 2124    Lab Results: No  results found for this or any previous visit (from the past 48 hour(s)).  Blood Alcohol level:  Lab Results  Component Value Date   ETH <10 06/25/2023   ETH <5 06/18/2015    Metabolic Disorder Labs: Lab Results  Component Value Date   HGBA1C 6.0 (A) 05/01/2023   MPG 102.54 08/18/2020   MPG 117 06/21/2015   Lab Results  Component Value Date   PROLACTIN 5.2 11/28/2019   PROLACTIN 112.5 (H) 06/21/2015   Lab Results  Component Value Date   CHOL 133 08/19/2022   TRIG 230.0 (H) 08/19/2022   HDL 41.60 08/19/2022   CHOLHDL 3 08/19/2022   VLDL 46.0 (H) 08/19/2022   LDLCALC 54 05/15/2017   LDLCALC 75 06/21/2015    Physical Findings: AIMS:  , ,  ,  ,    CIWA:    COWS:     Musculoskeletal: Strength & Muscle Tone: within normal limits Gait & Station: normal Patient leans: N/A  Psychiatric Specialty Exam:  Presentation  General Appearance:  Casual  Eye Contact: Good  Speech: Spontaneous  Speech Volume: Normal  Handedness: Right   Mood and Affect  Mood: Good  Affect: Stable   Thought Process  Thought Processes: Coherent; Goal Directed; Linear  Descriptions of Associations:Intact  Orientation:Full (Time, Place and Person)  Thought Content:Improved History of Schizophrenia/Schizoaffective disorder:Yes  Duration of Psychotic Symptoms:Greater than six months  Hallucinations:Denies Ideas of Reference:None  Suicidal Thoughts:Denies Homicidal Thoughts:Denies  Sensorium  Memory: Immediate Fair  Judgment: Improved  Insight: Improved   Executive Functions  Concentration: Fair  Attention Span: Fair   Fund of Knowledge: Fair  Language: Fair   Psychomotor Activity  Psychomotor Activity:Normal  Assets  Assets: Manufacturing systems engineer; Financial Resources/Insurance; Housing; Resilience; Social Support   Sleep  Sleep:Fair   Physical Exam: Physical Exam Constitutional:      Appearance: Normal appearance.  HENT:     Head:  Normocephalic and atraumatic.     Nose: Nose normal.  Eyes:     Pupils: Pupils are equal, round, and reactive to light.  Cardiovascular:     Rate and Rhythm: Regular rhythm.  Pulmonary:     Effort: Pulmonary effort is normal.  Skin:    General: Skin is warm.  Neurological:     Mental Status: She is alert and oriented to person, place, and time.    Review of Systems  Constitutional:  Negative for fever.  HENT:  Negative for congestion and hearing loss.   Eyes:  Negative for blurred vision and double vision.  Respiratory:  Negative for cough and shortness of breath.   Cardiovascular:  Negative for chest pain and palpitations.  Gastrointestinal:  Negative for nausea and vomiting.  Neurological:  Negative for dizziness and speech change.   Blood pressure (!) 116/57, pulse 85, temperature (!) 97.3 F (36.3 C), resp. rate 16, height 5' (1.524 m), weight 63.5 kg, SpO2 99%. Body mass index is 27.34 kg/m.   Treatment Plan Summary: Daily contact with patient to assess and evaluate symptoms and progress in treatment and Medication management  Unity Luepke  Marval Regal, MD 07/01/2023, 7:45 PM

## 2023-07-01 NOTE — Group Note (Signed)
Recreation Therapy Group Note   Group Topic:Health and Wellness  Group Date: 07/01/2023 Start Time: 1400 End Time: 1445 Facilitators: Rosina Lowenstein, LRT, CTRS Location:  Day Room  Group Description: Seated Exercise. LRT discussed the mental and physical benefits of exercise. LRT and group discussed how physical activity can be used as a coping skill. Pt's and LRT followed along to an exercise video on the TV screen that provided a visual representation and audio description of every exercise performed. Pt's encouraged to listen to their bodies and stop at any time if they experience feelings of discomfort or pain. Pts were encouraged to drink water and stay hydrated.   Goal Area(s) Addressed: Patient will learn benefits of physical activity. Patient will identify exercise as a coping skill.  Patient will follow multistep directions. Patient will try a new leisure interest.    Affect/Mood: N/A   Participation Level: Did not attend    Clinical Observations/Individualized Feedback: [name] did not attend group.   Plan: Continue to engage patient in RT group sessions 2-3x/week.   Rosina Lowenstein, LRT, CTRS 07/01/2023 3:13 PM

## 2023-07-01 NOTE — Progress Notes (Signed)
   06/30/23 2300  Psych Admission Type (Psych Patients Only)  Admission Status Voluntary  Psychosocial Assessment  Patient Complaints None  Eye Contact Fair  Facial Expression Animated  Affect Appropriate to circumstance  Speech Logical/coherent  Interaction Needy  Motor Activity Slow  Appearance/Hygiene Unremarkable  Behavior Characteristics Cooperative;Calm  Mood Pleasant  Thought Process  Coherency WDL  Content WDL  Delusions None reported or observed  Perception WDL  Hallucination None reported or observed  Judgment Impaired  Confusion None  Danger to Self  Current suicidal ideation? Denies  Agreement Not to Harm Self Yes  Description of Agreement verbal  Danger to Others  Danger to Others None reported or observed

## 2023-07-01 NOTE — Progress Notes (Signed)
   07/01/23 2200  Psych Admission Type (Psych Patients Only)  Admission Status Voluntary  Psychosocial Assessment  Patient Complaints None  Eye Contact Fair  Facial Expression Anxious  Affect Appropriate to circumstance  Speech Logical/coherent  Interaction Assertive  Motor Activity Slow  Appearance/Hygiene Unremarkable  Behavior Characteristics Cooperative  Mood Pleasant  Thought Process  Coherency WDL  Content WDL  Delusions None reported or observed  Perception WDL  Hallucination None reported or observed  Judgment Impaired  Confusion Mild  Danger to Self  Current suicidal ideation? Denies  Agreement Not to Harm Self Yes  Description of Agreement verbal  Danger to Others  Danger to Others None reported or observed

## 2023-07-01 NOTE — Progress Notes (Deleted)
   06/30/23 2300  Psych Admission Type (Psych Patients Only)  Admission Status Voluntary  Psychosocial Assessment  Patient Complaints None  Eye Contact Fair  Facial Expression Animated  Affect Appropriate to circumstance  Speech Logical/coherent  Interaction Needy  Motor Activity Slow  Appearance/Hygiene Unremarkable  Behavior Characteristics Cooperative;Calm  Mood Pleasant  Thought Process  Coherency WDL  Content WDL  Delusions None reported or observed  Perception WDL  Hallucination None reported or observed  Judgment Impaired  Confusion None  Danger to Self  Agreement Not to Harm Self Yes  Description of Agreement verbal  Danger to Others  Danger to Others None reported or observed

## 2023-07-01 NOTE — BH IP Treatment Plan (Signed)
Interdisciplinary Treatment and Diagnostic Plan Update  07/01/2023 Time of Session: 9:30 [name] MRN: [id number]  Principal Diagnosis: Schizoaffective disorder [facility])  Secondary Diagnoses: Principal Problem:   Schizoaffective disorder (HCC)   Current Medications:  Current Facility-Administered Medications  Medication Dose Route Frequency Provider Last Rate Last Admin   acetaminophen (TYLENOL) tablet 650 mg  650 mg Oral Q6H PRN Lauree Chandler, NP       albuterol (VENTOLIN HFA) 108 (90 Base) MCG/ACT inhaler 2 puff  2 puff Inhalation Q6H PRN Lauree Chandler, NP       alum & mag hydroxide-simeth (MAALOX/MYLANTA) 200-200-20 MG/5ML suspension 30 mL  30 mL Oral Q4H PRN Lauree Chandler, NP       benzonatate (TESSALON) capsule 200 mg  200 mg Oral BID PRN Sarina Ill, DO   200 mg at 06/30/23 2124   buPROPion (WELLBUTRIN XL) 24 hr tablet 150 mg  150 mg Oral Daily Sarina Ill, DO   150 mg at 07/01/23 1013   diphenhydrAMINE (BENADRYL) capsule 50 mg  50 mg Oral TID PRN Lauree Chandler, NP       Or   diphenhydrAMINE (BENADRYL) injection 50 mg  50 mg Intramuscular TID PRN Lauree Chandler, NP       divalproex (DEPAKOTE ER) 24 hr tablet 750 mg  750 mg Oral QHS Lauree Chandler, NP   750 mg at 06/30/23 2124   FLUoxetine (PROZAC) capsule 20 mg  20 mg Oral Daily Sarina Ill, DO   20 mg at 07/01/23 1013   guaiFENesin (MUCINEX) 12 hr tablet 600 mg  600 mg Oral BID Sarina Ill, DO   600 mg at 07/01/23 1013   haloperidol (HALDOL) tablet 5 mg  5 mg Oral TID PRN Lauree Chandler, NP       Or   haloperidol lactate (HALDOL) injection 5 mg  5 mg Intramuscular TID PRN Lauree Chandler, NP       hydrOXYzine (ATARAX) tablet 25 mg  25 mg Oral TID PRN Lauree Chandler, NP   25 mg at 06/27/23 2113   levothyroxine (SYNTHROID) tablet 100 mcg  100 mcg Oral Q0600 Lauree Chandler, NP   100 mcg at 07/01/23 4098   LORazepam (ATIVAN)  tablet 2 mg  2 mg Oral TID PRN Lauree Chandler, NP       Or   LORazepam (ATIVAN) injection 2 mg  2 mg Intramuscular TID PRN Lauree Chandler, NP       magnesium hydroxide (MILK OF MAGNESIA) suspension 30 mL  30 mL Oral Daily PRN Lauree Chandler, NP       pravastatin (PRAVACHOL) tablet 20 mg  20 mg Oral QHS Lauree Chandler, NP   20 mg at 06/30/23 2124   risperiDONE (RISPERDAL) tablet 0.25 mg  0.25 mg Oral [id number] Sarina Ill, DO   0.25 mg at 07/01/23 1556   traZODone (DESYREL) tablet 50 mg  50 mg Oral QHS PRN Lauree Chandler, NP   50 mg at 06/30/23 2124   PTA Medications: Medications Prior to Admission  Medication Sig Dispense Refill Last Dose   albuterol (VENTOLIN HFA) 108 (90 Base) MCG/ACT inhaler Inhale 2 puffs into the lungs every 6 (six) hours as needed for wheezing or shortness of breath. 18 g 1    aspirin (ASPIRIN CHILDRENS) 81 MG chewable tablet Chew 1 tablet (81 mg total) by mouth daily.      aspirin-acetaminophen-caffeine (EXCEDRIN MIGRAINE) [phone]  MG tablet Take 2 tablets by mouth every 6 (six) hours as needed for headache.      AUVELITY 45-105 MG TBCR Take 1 tablet by mouth every morning.      benzonatate (TESSALON) 200 MG capsule Take 1 capsule (200 mg total) by mouth 2 (two) times daily as needed for cough. 20 capsule 0    divalproex (DEPAKOTE ER) 250 MG 24 hr tablet Take 750 mg by mouth at bedtime.      levothyroxine (SYNTHROID) 100 MCG tablet Take 1 tablet (100 mcg total) by mouth daily. 90 tablet 3    meclizine (ANTIVERT) 25 MG tablet Take 1 tablet (25 mg total) by mouth 3 (three) times daily as needed for dizziness. 30 tablet 0    metoprolol tartrate (LOPRESSOR) 25 MG tablet Take 1 tablet (25 mg total) by mouth as directed for 3 doses. Take 1 tablet twice daily 1 day before CT scan and 1 tablet before CT scan 3 tablet 0    Multiple Vitamin (MULTIVITAMIN WITH MINERALS) TABS tablet Take 1 tablet by mouth daily.      omeprazole (PRILOSEC) 40 MG  capsule Take 1 capsule (40 mg total) by mouth daily. 60 capsule 0    ondansetron (ZOFRAN) 4 MG tablet Take 1 tablet (4 mg total) by mouth every 8 (eight) hours as needed for nausea or vomiting. (Patient not taking: Reported on 06/25/2023) 20 tablet 0    pravastatin (PRAVACHOL) 20 MG tablet Take 1 tablet by mouth once daily 90 tablet 1    terbinafine (LAMISIL) 250 MG tablet Take 1 tablet (250 mg total) by mouth daily. 90 tablet 0     Patient Stressors: Marital or family conflict   Other: the pres election    Patient Strengths: Ability for insight  Capable of independent living  Communication skills  General fund of knowledge  Supportive family/friends   Treatment Modalities: Medication Management, Group therapy, Case management,  1 to 1 session with clinician, Psychoeducation, Recreational therapy.   Physician Treatment Plan for Primary Diagnosis: Schizoaffective disorder (HCC) Long Term Goal(s): Improvement in symptoms so as ready for discharge   Short Term Goals: Ability to identify changes in lifestyle to reduce recurrence of condition will improve Ability to verbalize feelings will improve Ability to disclose and discuss suicidal ideas Ability to demonstrate self-control will improve Ability to identify and develop effective coping behaviors will improve Ability to maintain clinical measurements within normal limits will improve Compliance with prescribed medications will improve Ability to identify triggers associated with substance abuse/mental health issues will improve  Medication Management: Evaluate patient's response, side effects, and tolerance of medication regimen.  Therapeutic Interventions: 1 to 1 sessions, Unit Group sessions and Medication administration.  Evaluation of Outcomes: Adequate for Discharge  Physician Treatment Plan for Secondary Diagnosis: Principal Problem:   Schizoaffective disorder (HCC)  Long Term Goal(s): Improvement in symptoms so as ready  for discharge   Short Term Goals: Ability to identify changes in lifestyle to reduce recurrence of condition will improve Ability to verbalize feelings will improve Ability to disclose and discuss suicidal ideas Ability to demonstrate self-control will improve Ability to identify and develop effective coping behaviors will improve Ability to maintain clinical measurements within normal limits will improve Compliance with prescribed medications will improve Ability to identify triggers associated with substance abuse/mental health issues will improve     Medication Management: Evaluate patient's response, side effects, and tolerance of medication regimen.  Therapeutic Interventions: 1 to 1 sessions, Unit Group sessions and Medication  administration.  Evaluation of Outcomes: Adequate for Discharge   RN Treatment Plan for Primary Diagnosis: Schizoaffective disorder (HCC) Long Term Goal(s): Knowledge of disease and therapeutic regimen to maintain health will improve  Short Term Goals: Ability to remain free from injury will improve, Ability to verbalize frustration and anger appropriately will improve, Ability to demonstrate self-control, Ability to participate in decision making will improve, Ability to verbalize feelings will improve, Ability to disclose and discuss suicidal ideas, Ability to identify and develop effective coping behaviors will improve, and Compliance with prescribed medications will improve  Medication Management: RN will administer medications as ordered by provider, will assess and evaluate patient's response and provide education to patient for prescribed medication. RN will report any adverse and/or side effects to prescribing provider.  Therapeutic Interventions: 1 on 1 counseling sessions, Psychoeducation, Medication administration, Evaluate responses to treatment, Monitor vital signs and CBGs as ordered, Perform/monitor CIWA, COWS, AIMS and Fall Risk screenings as  ordered, Perform wound care treatments as ordered.  Evaluation of Outcomes: Adequate for Discharge   LCSW Treatment Plan for Primary Diagnosis: Schizoaffective disorder [facility]) Long Term Goal(s): Safe transition to appropriate next level of care at discharge, Engage patient in therapeutic group addressing interpersonal concerns.  Short Term Goals: Engage patient in aftercare planning with referrals and resources, Increase social support, Increase ability to appropriately verbalize feelings, Increase emotional regulation, Facilitate acceptance of mental health diagnosis and concerns, Facilitate patient progression through stages of change regarding substance use diagnoses and concerns, Identify triggers associated with mental health/substance abuse issues, and Increase skills for wellness and recovery  Therapeutic Interventions: Assess for all discharge needs, 1 to 1 time with Social worker, Explore available resources and support systems, Assess for adequacy in community support network, Educate family and significant other(s) on suicide prevention, Complete Psychosocial Assessment, Interpersonal group therapy.  Evaluation of Outcomes: Adequate for Discharge   Progress in Treatment: Attending groups: Yes. and No. Participating in groups: Yes. and No. Taking medication as prescribed: Yes. Toleration medication: Yes. Family/Significant other contact made: Yes, individual(s) contacted:  [name], wife Patient understands diagnosis: Yes. Discussing patient identified problems/goals with staff: Yes. Medical problems stabilized or resolved: Yes. Denies suicidal/homicidal ideation: Yes. Issues/concerns per patient self-inventory: No. Other: None   New problem(s) identified: No, Describe:  None identified Update 07/01/23: No changes at this time    New Short Term/Long Term Goal(s): elimination of symptoms of psychosis, medication management for mood stabilization; elimination of SI  thoughts; development of comprehensive mental wellness plan. Update 07/01/23: No changes at this time    Patient Goals:  "To not fall down" Update 07/01/23: No changes at this time    Discharge Plan or Barriers: CSW will assist with appropriate discharge planning Update 07/01/23: No changes at this time    Reason for Continuation of Hospitalization: Anxiety Medication stabilization   Estimated Length of Stay: 1 to 7 days Update 07/01/23: 07/02/2023    Last 3 [place] Suicide Severity Risk Score: Flowsheet Row Admission (Current) from 06/25/2023 in [facility] BE[facility]AVIORAL MEDICINE Most recent reading at 06/25/2023  7:00 PM ED from 06/25/2023 in [facility] Most recent reading at 06/25/2023  6:55 AM ED from 05/18/2023 in [facility] Emergency Department at [facility] Most recent reading at 05/18/2023  6:50 PM  C-SSRS RISK CATEGORY No Risk No Risk No Risk       Last PHQ 2/9 Scores:    06/11/2023    1:13 PM 01/14/2023    3:16 PM 01/14/2023  3:14 PM  Depression screen PHQ 2/9  Decreased Interest 1 1 0  Down, Depressed, Hopeless 1 1 0  PHQ - 2 Score 2 2 0  Altered sleeping 0 0   Tired, decreased energy 0 0   Change in appetite 0 0   Feeling bad or failure about yourself  0 0   Trouble concentrating 0 0   Moving slowly or fidgety/restless 0 0   Suicidal thoughts 0 0   PHQ-9 Score 2 2   Difficult doing work/chores Not difficult at all Not difficult at all     Scribe for Treatment Team: Elza Rafter, Theresia Majors 07/01/2023 4:15 PM

## 2023-07-01 NOTE — Group Note (Signed)
Date:  07/01/2023 Time:  2:41 AM  Group Topic/Focus:  Self Care:   The focus of this group is to help patients understand the importance of self-care in order to improve or restore emotional, physical, spiritual, interpersonal, and financial health.    Participation Level:  Did Not Attend  Participation Quality:      Affect:      Cognitive:      Insight: None  Engagement in Group:  None  Modes of Intervention:      Additional Comments:    Maeola Harman 07/01/2023, 2:41 AM

## 2023-07-01 NOTE — Progress Notes (Signed)
Patient is an involuntary commitment to [name] for [facility].  Plan is to discharge tomorrow evening.  Patient is aware and excited to be discharged. Patient ambulates with walker, coming out for meals and group.  Interacts well with peers and staff.  Denies SI, HI, AVH, anxiety and depression. Will continue to monitor.

## 2023-07-01 NOTE — Group Note (Signed)
Date:  07/01/2023 Time:  11:15 AM  Group Topic/Focus:  Values Group The purpose of this group is for patients to identity their values as to relationships,parenting,marriage,career spiritual and health. Also rating from 0-10 about how important they are to the patients.    Participation Level:  Did Not Attend  Participation Quality:    Affect:    Cognitive:    Insight:   Engagement in Group:    Modes of Intervention:    Additional Comments:  Did not attend  Bacilio Abascal T Gilbert Narain 07/01/2023, 11:15 AM

## 2023-07-02 DIAGNOSIS — F251 Schizoaffective disorder, depressive type: Secondary | ICD-10-CM | POA: Diagnosis not present

## 2023-07-02 MED ORDER — BUPROPION HCL ER (XL) 150 MG PO TB24: 150.0000 mg | ORAL_TABLET | Freq: Every day | ORAL | 0 refills | Status: AC

## 2023-07-02 MED ORDER — FLUOXETINE HCL 20 MG PO CAPS: 20.0000 mg | ORAL_CAPSULE | Freq: Every day | ORAL | 0 refills | Status: AC

## 2023-07-02 MED ORDER — HYDROXYZINE HCL 25 MG PO TABS: 25.0000 mg | ORAL_TABLET | Freq: Three times a day (TID) | ORAL | 0 refills | Status: AC | PRN

## 2023-07-02 MED ORDER — LEVOTHYROXINE SODIUM 100 MCG PO TABS: 100.0000 ug | ORAL_TABLET | Freq: Every day | ORAL | 0 refills | Status: DC

## 2023-07-02 MED ORDER — DIVALPROEX SODIUM ER 250 MG PO TB24: 750.0000 mg | ORAL_TABLET | Freq: Every day | ORAL | 0 refills | Status: AC

## 2023-07-02 MED ORDER — PRAVASTATIN SODIUM 20 MG PO TABS: 20.0000 mg | ORAL_TABLET | Freq: Every day | ORAL | 0 refills | Status: DC

## 2023-07-02 MED ORDER — TRAZODONE HCL 50 MG PO TABS: 50.0000 mg | ORAL_TABLET | Freq: Every evening | ORAL | 0 refills | Status: AC | PRN

## 2023-07-02 MED ORDER — RISPERIDONE 0.25 MG PO TABS: 0.2500 mg | ORAL_TABLET | ORAL | 0 refills | Status: AC

## 2023-07-02 MED ORDER — ASPIRIN 81 MG PO CHEW: 81.0000 mg | CHEWABLE_TABLET | Freq: Every day | ORAL | 0 refills | Status: AC

## 2023-07-02 NOTE — Progress Notes (Signed)
   07/02/23 0707  Psych Admission Type (Psych Patients Only)  Admission Status Voluntary  Psychosocial Assessment  Patient Complaints None  Eye Contact Fair  Facial Expression Anxious  Affect Appropriate to circumstance  Speech Logical/coherent  Interaction Assertive  Motor Activity Slow  Appearance/Hygiene Unremarkable  Behavior Characteristics Cooperative  Mood Pleasant  Thought Process  Coherency WDL  Content WDL  Delusions None reported or observed  Perception WDL  Hallucination None reported or observed  Judgment Impaired  Confusion Mild  Danger to Self  Current suicidal ideation? Denies  Danger to Others  Danger to Others None reported or observed

## 2023-07-02 NOTE — Progress Notes (Signed)

## 2023-07-02 NOTE — Progress Notes (Signed)
  [facility] Adult Case Management Discharge Plan :  Will you be returning to the same living situation after discharge:  Yes,  pt will return home  At discharge, do you have transportation home?: Yes,  pt's wife will pick her up  Do you have the ability to pay for your medications: Yes,  [employer] MEDICARE / [employer] MEDICARE CHOICE PPO  Release of information consent forms completed and in the chart;  Patient's signature needed at discharge.  Patient to Follow up at:  Follow-up Information     [facility] Follow up on 07/07/2023.   Specialty: Behavioral Health Why: Your appointment is scheduled for 07/07/2023 at 11:30 AM. Please remember to bring your insurance card. Contact information: [address] [place] [address] [phone]                 Next level of care provider has access to [facility] Link:no  Safety Planning and Suicide Prevention discussed: Valentino Hue  [name], [phone]     Has patient been referred to the Quitline?: Patient refused referral for treatment  Patient has been referred for addiction treatment: No known substance use disorder.  [address] LCSWA 07/02/2023, 11:24 AM

## 2023-07-02 NOTE — Group Note (Signed)
Recreation Therapy Group Note   Group Topic:General Recreation  Group Date: 07/02/2023 Start Time: 1400 End Time: 1450 Facilitators: Rosina Lowenstein, LRT, CTRS Location:  Day Room  Group Description: Bingo. LRT and patients played multiple games of Bingo with music playing in the background. LRT and pts discussed how this could be a leisure interest and the importance of doing things they enjoy post-discharge. Pts won stress balls and [employer] as Chief Financial Officer.   Goal Area(s) Addressed: Patient will identify leisure interests.  Patient will practice healthy decision making. Patient will engage in recreation activity.  Patient will increase communication.    Affect/Mood: N/A   Participation Level: Did not attend    Clinical Observations/Individualized Feedback: [name] did not attend group.   Plan: Continue to engage patient in RT group sessions 2-3x/week.   Rosina Lowenstein, LRT, CTRS 07/02/2023 3:19 PM

## 2023-07-02 NOTE — BHH Suicide Risk Assessment (Signed)
[facility] [place] Discharge Suicide Risk Assessment   Principal Problem: Schizoaffective disorder [facility]) Discharge Diagnoses: Principal Problem:   Schizoaffective disorder (HCC)     Musculoskeletal: Strength & Muscle Tone: within normal limits Gait & Station: normal Patient leans: N/A   Psychiatric Specialty Exam:   Presentation  General Appearance:  Casual   Eye Contact: Good   Speech: Spontaneous   Speech Volume: Normal   Handedness: Right     Mood and Affect  Mood: Good   Affect: Stable     Thought Process  Thought Processes: Coherent; Goal Directed; Linear   Descriptions of Associations:Intact   Orientation:Full (Time, Place and Person)   Thought Content:Improved History of Schizophrenia/Schizoaffective disorder:Yes   Duration of Psychotic Symptoms:Greater than six months   Hallucinations:Denies Ideas of Reference:None   Suicidal Thoughts:Denies Homicidal Thoughts:Denies   Sensorium  Memory: Immediate Fair   Judgment: Improved   Insight: Improved     Executive Functions  Concentration: Fair   Attention Span: Fair     Fund of Knowledge: Fair   Language: Fair     Psychomotor Activity  Psychomotor Activity:Normal   Assets  Assets: Manufacturing systems engineer; Financial Resources/Insurance; Housing; Resilience; Social Support     Sleep  Sleep:Fair     Physical Exam: Physical Exam Constitutional:      Appearance: Normal appearance.  HENT:     Head: Normocephalic and atraumatic.     Nose: Nose normal.  Eyes:     Pupils: Pupils are equal, round, and reactive to light.  Cardiovascular:     Rate and Rhythm: Regular rhythm.  Pulmonary:     Effort: Pulmonary effort is normal.  Skin:    General: Skin is warm.  Neurological:     Mental Status: She is alert and oriented to person, place, and time.      Review of Systems  Constitutional:  Negative for fever.  HENT:  Negative for congestion and hearing loss.   Eyes:  Negative for  blurred vision and double vision.  Respiratory:  Negative for cough and shortness of breath.   Cardiovascular:  Negative for chest pain and palpitations.  Gastrointestinal:  Negative for nausea and vomiting.  Neurological:  Negative for dizziness and speech change.    Blood pressure 126/78, pulse 83, temperature 97.9 F (36.6 C), resp. rate 18, height 5' (1.524 m), weight 63.5 kg, SpO2 100%. Body mass index is 27.34 kg/m.   Demographic Factors:  Caucasian and Gay, lesbian, or bisexual orientation  Loss Factors: NA  Historical Factors: Impulsivity  Risk Reduction Factors:   Positive social support, Positive therapeutic relationship, and Positive coping skills or problem solving skills  Continued Clinical Symptoms:  Previous Psychiatric Diagnoses and Treatments  Cognitive Features That Contribute To Risk:  Polarized thinking and Thought constriction (tunnel vision)    Suicide Risk:  Minimal: No identifiable suicidal ideation.      Plan Of Care/Follow-up recommendations:  Per Discharge Summary  Lewanda Rife, MD 07/02/2023, 10:52 AM

## 2023-07-02 NOTE — Discharge Summary (Signed)
Physician Discharge Summary Note  Patient:  [name] is an 62 y.o., female MRN:  [id number] DOB:  Jan 06, 1961 Patient phone:  [phone] (home)  Patient address:   [address] [place] [address],    Date of Admission:  06/25/2023 Date of Discharge: 07/02/2023  Reason for Admission:  [name] is a 62 year old white female who was involuntarily admitted to inpatient psychiatry for depression. She sees Dr. Donell Beers who has her diagnosed with unspecified psychosis. She tells me that she was upset over the election and has not been sleeping.   Principal Problem: Schizoaffective disorder [facility]) Discharge Diagnoses: Principal Problem:   Schizoaffective disorder [facility])   Past Psychiatric History:  Extensive history of depression, cluster B traits, and bipolar disorder   Past Medical History:  Past Medical History:  Diagnosis Date   Abnormal LFTs 04/06/2013   Very minor tried to reassure okay to repeat today with hepatitis C screen   Allergic rhinitis 03/26/2007   Autonomic dysfunction 01/11/2015   Bipolar 1 disorder    Hx of psychosis; present since age 61   Chest pain on breathing 03/16/2014   Chronic fatigue 06/21/2017   Chronic interstitial cystitis 11/15/2020   Community acquired pneumonia of right lung 08/17/2020   Complication of anesthesia    agitation   Constipation 05/17/2012   Costochondritis 02/14/2014   Diverticular disease of colon 08/06/2020   Double vision 03/15/2011   Dyspnea on effort 01/10/2011   Dysuria 02/15/2013   Early satiety 04/27/2013   Elevated lactic acid level    Endometriosis 05/11/2008   Fatty liver disease, nonalcoholic 02/13/2014   Fecal urgency 08/06/2020   Female stress incontinence 11/15/2020   Food aversion 11/08/2012   Frequency of urination    Gastroesophageal reflux disease 08/06/2020   Headache    History of duodenal ulcer    History of hysterectomy 11/15/2020   History of kidney stones    History of lithium toxicity  05/01/2014   Hoarseness of voice 02/27/2016   Hyperglycemia 01/10/2011   Hyperlipidemia, mixed 05/12/2017   Hyperprolactinemia 06/26/2015   Insomnia 05/12/2017   Irritable bowel syndrome 08/06/2020   Jaw pain 02/13/2012   poss tmj area     Left upper quadrant pain 08/06/2020   Leukocytosis 05/01/2014   Lump of breast, left 12/20/2012   about 6 oclock    Mass of urethra 11/15/2020   Mild cognitive impairment of uncertain or unknown etiology 10/16/2020   Multinodular goiter 05/12/2017   Nocturia    Nondiabetic gastroparesis 01/11/2015   Noninvasive follicular neoplasm of thyroid with papillary-like nuclear features 10/10/2021   Palpitations 12/08/2010   Pelvic pain    Periumbilical pain 08/06/2020   Plantar fasciitis 06/11/2007   Postsurgical hypothyroidism 09/03/2021   Pruritus ani 08/06/2020   Right ear pain 02/16/2012   Social anxiety disorder    Subacute bronchitis 07/10/2018   Type II diabetes mellitus 10/10/2021   Unstable gait 07/05/2022   Urgency of urination    Vitamin B12 deficiency 09/03/2021   Vitamin D deficiency 09/23/2011    Past Surgical History:  Procedure Laterality Date   CARDIAC CATHETERIZATION  10-09-1999  DR KATZ   NORMAL LVF/  NORMAL RCA/  NO CRITICAL DISEASE LEFT CORONARY SYSTEM   CARDIOVASCULAR STRESS TEST  01-23-2011   NORMAL NUCLEAR STUDY/ NO ISCHEMIA/ EF 81%   CYSTOSCOPY WITH BIOPSY N/A 06/03/2013   Procedure: CYSTOSCOPY WITH BIOPSY  INSTILLATION OF MARCAINE AND PYRIDIUM;  Surgeon: Lindaann Slough, MD;  Location: [facility];  Service: Urology;  Laterality: N/A;   LAPAROSCOPIC CHOLECYSTECTOMY  11-08-2001   LAPAROSCOPIC LEFT SALPINGOOPHORECTOMY AND LYSYS ADHESIONS  03-10-2002   LAPAROSCOPIC REMOVAL OVARY REMNANT  2003   [place]   LAPAROSCOPY N/A 12/14/2012   Procedure: LAPAROSCOPY DIAGNOSTIC;  Surgeon: Almond Lint, MD;  Location: [facility];  Service: General;  Laterality: N/A;   THYROIDECTOMY N/A 05/24/2021   Procedure: TOTAL  THYROIDECTOMY;  Surgeon: Darnell Level, MD;  Location: [facility];  Service: General;  Laterality: N/A;   TOTAL ABDOMINAL HYSTERECTOMY  2000   W/ RIGHT SALPINGOOPHORECTOMY   TRANSTHORACIC ECHOCARDIOGRAM  10-13-2010   NORMAL LVF/  EF 55-60%   Family History:  Family History  Problem Relation Age of Onset   Other Mother        MAC infection   Anxiety disorder Mother    Dementia Mother        likely Alzheimer's disease; symptom onset on late 70s/early 80s   Sleep apnea Father    Depression Father    Alcohol abuse Father    Hypertension Father    Migraines Sister        headaches   Anxiety disorder Sister    Depression Sister    Heart disease Paternal Grandmother    Hyperlipidemia Paternal Grandmother    Alcohol abuse Paternal Grandfather    Diabetes Neg Hx     Social History:  Social History   Substance and Sexual Activity  Alcohol Use Not Currently   Comment: occ     Social History   Substance and Sexual Activity  Drug Use Never    Social History   Socioeconomic History   Marital status: Married    Spouse name: Not on file   Number of children: 0   Years of education: 16   Highest education level: Bachelor's degree (e.g., BA, AB, BS)  Occupational History   Occupation: Disability    Comment: Nurse  Tobacco Use   Smoking status: Former    Current packs/day: 0.00    Types: Cigarettes    Start date: 1979    Quit date: 1980    Years since quitting: 44.9   Smokeless tobacco: Never   Tobacco comments:    ONLY SMOKED FOR 6 MONTHS --  QUIT  YRS AGO  Vaping Use   Vaping status: Never Used  Substance and Sexual Activity   Alcohol use: Not Currently    Comment: occ   Drug use: Never   Sexual activity: Not on file  Other Topics Concern   Not on file  Social History Narrative   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    no tobacco    Caffeine Use: very little, three times a week   Left handed    Social Determinants of Health   Financial Resource Strain: Low Risk  (05/20/2023)   Overall Financial Resource Strain (CARDIA)    Difficulty of Paying Living Expenses: Not hard at all  Food Insecurity: No Food Insecurity (06/25/2023)   Hunger Vital Sign    Worried About Running Out of Food in the Last Year: Never true    Ran Out of Food in the Last Year: Never true  Transportation Needs: No Transportation Needs (06/25/2023)   PRAPARE - Administrator, Civil Service (Medical): No    Lack of Transportation (Non-Medical): No  Physical Activity: Insufficiently Active (05/20/2023)   Exercise Vital  Sign    Days of Exercise per Week: 1 day    Minutes of Exercise per Session: 10 min  Stress: Stress Concern Present (05/20/2023)   [employer] of Occupational Health - Occupational Stress Questionnaire    Feeling of Stress : To some extent  Social Connections: Socially Isolated (05/20/2023)   Social Connection and Isolation Panel [NHANES]    Frequency of Communication with Friends and Family: Never    Frequency of Social Gatherings with Friends and Family: Once a week    Attends Religious Services: Never    Database administrator or Organizations: No    Attends Banker Meetings: Never    Marital Status: Married    Hospital Course:  The patient was admitted to Inpatient psychiatric treatment for stabilization of depression and suicide thoughts.  This patient was placed on suicidal precautions. The patient was evaluated and treated by the multidisciplinary treatment team including physicians, nurses, social workers and therapists. All medications were presented to the patient and the Patient gave consent to all the medications that they were given, as well as was explained the risks, benefits, side effects and alternatives of all medication therapies. The patient was integrated into the general milieu on the  ward and encouraged to attend to her ADLs and participate in all groups and activities. During hospital course the Patient attended coping skill groups, music therapy and activity therapy groups. Patient was counseled on cognitive techniques/skills by multiple staff members and given support care by the staff.   Patient's medication regimen was evaluated and titrated to therapeutic levels to better Patient's overall daily functioning.  Patient was initially followed by Dr. Marlou Porch.  Patient was started on Wellbutrin, Depakote, fluoxetine, and Risperdal.  Patient tolerating medicine fine without any side effects.Patient confirmed that her step son has removed the guns. Patient reports she won't have access to weapons upon discharge.  During the hospitalization, the patient demonstrated a stabilization of mood and depression with improved sleep and appetite.   At the time of discharge, the patient denied any suicidal ideation/homicidal ideation and was not overtly depressed, manic or psychotic. The Patient was interacting well in groups and on the unit with their peers. Patient was able to identify a safety plan to include speaking with family, contacting outpatient provider or calling 911 if hallucinations/delusions returned or worsened or thoughts of self-harm or suicide return. Patient was counselled on outpatient follow-up that was arranged prior to discharge.    Musculoskeletal: Strength & Muscle Tone: within normal limits Gait & Station: normal Patient leans: N/A   Psychiatric Specialty Exam:   Presentation  General Appearance:  Casual   Eye Contact: Good   Speech: Spontaneous   Speech Volume: Normal   Handedness: Right     Mood and Affect  Mood: Good   Affect: Stable     Thought Process  Thought Processes: Coherent; Goal Directed; Linear   Descriptions of Associations:Intact   Orientation:Full (Time, Place and Person)   Thought Content:Improved History of  Schizophrenia/Schizoaffective disorder:Yes   Duration of Psychotic Symptoms:Greater than six months   Hallucinations:Denies Ideas of Reference:None   Suicidal Thoughts:Denies Homicidal Thoughts:Denies   Sensorium  Memory: Immediate Fair   Judgment: Improved   Insight: Improved     Executive Functions  Concentration: Fair   Attention Span: Fair     Fund of Knowledge: Fair   Language: Fair     Psychomotor Activity  Psychomotor Activity:Normal   Assets  Assets: Manufacturing systems engineer; Financial Resources/Insurance; Housing; Resilience;  Social Support     Sleep  Sleep:Fair     Physical Exam: Physical Exam Constitutional:      Appearance: Normal appearance.  HENT:     Head: Normocephalic and atraumatic.     Nose: Nose normal.  Eyes:     Pupils: Pupils are equal, round, and reactive to light.  Cardiovascular:     Rate and Rhythm: Regular rhythm.  Pulmonary:     Effort: Pulmonary effort is normal.  Skin:    General: Skin is warm.  Neurological:     Mental Status: She is alert and oriented to person, place, and time.      Review of Systems  Constitutional:  Negative for fever.  HENT:  Negative for congestion and hearing loss.   Eyes:  Negative for blurred vision and double vision.  Respiratory:  Negative for cough and shortness of breath.   Cardiovascular:  Negative for chest pain and palpitations.  Gastrointestinal:  Negative for nausea and vomiting.  Neurological:  Negative for dizziness and speech change.   Blood pressure 126/78, pulse 83, temperature 97.9 F (36.6 C), resp. rate 18, height 5' (1.524 m), weight 63.5 kg, SpO2 100%. Body mass index is 27.34 kg/m.   Social History   Tobacco Use  Smoking Status Former   Current packs/day: 0.00   Types: Cigarettes   Start date: 1979   Quit date: 1980   Years since quitting: 44.9  Smokeless Tobacco Never  Tobacco Comments   ONLY SMOKED FOR 6 MONTHS --  QUIT  YRS AGO   Tobacco Cessation:   N/A, patient does not currently use tobacco products   Blood Alcohol level:  Lab Results  Component Value Date   ETH <10 06/25/2023   ETH <5 06/18/2015    Metabolic Disorder Labs:  Lab Results  Component Value Date   HGBA1C 6.0 (A) 05/01/2023   MPG 102.54 08/18/2020   MPG 117 06/21/2015   Lab Results  Component Value Date   PROLACTIN 5.2 11/28/2019   PROLACTIN 112.5 (H) 06/21/2015   Lab Results  Component Value Date   CHOL 133 08/19/2022   TRIG 230.0 (H) 08/19/2022   HDL 41.60 08/19/2022   CHOLHDL 3 08/19/2022   VLDL 46.0 (H) 08/19/2022   LDLCALC 54 05/15/2017   LDLCALC 75 06/21/2015    See Psychiatric Specialty Exam and Suicide Risk Assessment completed by Attending Physician prior to discharge.  Discharge destination:  Home  Is patient on multiple antipsychotic therapies at discharge:  No     Recommended Plan for Multiple Antipsychotic Therapies: NA   Allergies as of 07/02/2023       Reactions   Morphine And Codeine Nausea And Vomiting, Nausea Only   Ambien [zolpidem] Other (See Comments)   Per patient this caused her to fall   Duloxetine Swelling   Edema   Latuda [lurasidone Hcl]    Morphine Nausea Only   Shingrix [zoster Vac Recomb Adjuvanted]    Dizziness vomiting        Medication List     STOP taking these medications    aspirin-acetaminophen-caffeine 250-250-65 MG tablet Commonly known as: EXCEDRIN MIGRAINE   Auvelity 45-105 MG Tbcr Generic drug: Dextromethorphan-buPROPion ER   metoprolol tartrate 25 MG tablet Commonly known as: LOPRESSOR   ondansetron 4 MG tablet Commonly known as: Zofran   terbinafine 250 MG tablet Commonly known as: LamISIL       TAKE these medications      Indication  albuterol 108 (90 Base) MCG/ACT inhaler  Commonly known as: VENTOLIN HFA Inhale 2 puffs into the lungs every 6 (six) hours as needed for wheezing or shortness of breath.    aspirin 81 MG chewable tablet Commonly known as: Aspirin  Childrens Chew 1 tablet (81 mg total) by mouth daily.    benzonatate 200 MG capsule Commonly known as: TESSALON Take 1 capsule (200 mg total) by mouth 2 (two) times daily as needed for cough.    buPROPion 150 MG 24 hr tablet Commonly known as: WELLBUTRIN XL Take 1 tablet (150 mg total) by mouth daily. Start taking on: July 03, 2023    divalproex 250 MG 24 hr tablet Commonly known as: DEPAKOTE ER Take 3 tablets (750 mg total) by mouth at bedtime.    FLUoxetine 20 MG capsule Commonly known as: PROZAC Take 1 capsule (20 mg total) by mouth daily. Start taking on: July 03, 2023    hydrOXYzine 25 MG tablet Commonly known as: ATARAX Take 1 tablet (25 mg total) by mouth 3 (three) times daily as needed for anxiety.    levothyroxine 100 MCG tablet Commonly known as: SYNTHROID Take 1 tablet (100 mcg total) by mouth daily at 6 (six) AM. Start taking on: July 03, 2023 What changed: when to take this    meclizine 25 MG tablet Commonly known as: ANTIVERT Take 1 tablet (25 mg total) by mouth 3 (three) times daily as needed for dizziness.    multivitamin with minerals Tabs tablet Take 1 tablet by mouth daily.    omeprazole 40 MG capsule Commonly known as: PRILOSEC Take 1 capsule (40 mg total) by mouth daily.    pravastatin 20 MG tablet Commonly known as: PRAVACHOL Take 1 tablet (20 mg total) by mouth at bedtime. What changed: when to take this    risperiDONE 0.25 MG tablet Commonly known as: RISPERDAL Take 1 tablet (0.25 mg total) by mouth 2 (two) times daily at 8 am and 4 pm.    traZODone 50 MG tablet Commonly known as: DESYREL Take 1 tablet (50 mg total) by mouth at bedtime as needed for sleep.         Follow-up Information     [facility] Follow up on 07/07/2023.   Specialty: Behavioral Health Why: Your appointment is scheduled for 07/07/2023 at 11:30 AM. Please remember to bring your insurance card. Contact information: [address] [place] [address] [phone]                PATIENTS CONDITION AT DISCHARGE:  Stable  TOBACCO CESSATION SCREENING  Patient was screened and counselled on smoking cessation at time of discharge.    PRESCRIPTION ARE LOCATED:  On Chart   DISCHARGE INSTRUCTIONS:  1. Diet: Cardiac healthy  2. Activity: As tolerated  3. Take medications as prescribed and not to make any changes without first consulting with the outpatient provider.  4. Patient was advised to avoid any illicit drugs or alcohol due to negative impact on physical and mental health.  5. Patient should keep all follow up appointments.   TIME SPENT ON DISCHARGE: Over 35 minutes were spent on this patients discharge including a face to face encounter, patient counseling and preparation of discharge materials.   Signed: Lewanda Rife, MD 07/02/2023, 11:42 AM

## 2023-07-02 NOTE — Group Note (Signed)
Date:  07/02/2023 Time:  12:14 PM  Group Topic/Focus:  Overcoming Stress:   The focus of this group is to define stress and help patients assess their triggers.    Participation Level:  Active  Participation Quality:  Appropriate  Affect:  Appropriate  Cognitive:  Alert and Appropriate  Insight: Appropriate  Engagement in Group:  Engaged  Modes of Intervention:  Discussion, Socialization, and Support  Additional Comments:    Carlisa Eble l Marshon Bangs 07/02/2023, 12:14 PM

## 2023-07-03 ENCOUNTER — Telehealth: Payer: Self-pay

## 2023-07-03 NOTE — Transitions of Care (Post Inpatient/ED Visit) (Signed)
   07/03/2023  Name: [name] MRN: [id number] DOB: Aug 25, 1960  Today's TOC FU Call Status: Today's TOC FU Call Status:: Unsuccessful Call (1st Attempt) Unsuccessful Call (1st Attempt) Date: 07/03/23  Attempted to reach the patient regarding the most recent Inpatient/ED visit.  Follow Up Plan: Additional outreach attempts will be made to reach the patient to complete the Transitions of Care (Post Inpatient/ED visit) call.   [name], RN, BSN, CEN [employer]- Transition of Care Team.  [facility] [phone]

## 2023-07-06 ENCOUNTER — Telehealth: Payer: Self-pay

## 2023-07-06 NOTE — Transitions of Care (Post Inpatient/ED Visit) (Signed)
   07/06/2023  Name: [name] MRN: [id number] DOB: 08/23/1960  Today's TOC FU Call Status: Today's TOC FU Call Status:: Unsuccessful Call (2nd Attempt) Unsuccessful Call (2nd Attempt) Date: 07/06/23  Attempted to reach the patient regarding the most recent Inpatient/ED visit.  Follow Up Plan: Additional outreach attempts will be made to reach the patient to complete the Transitions of Care (Post Inpatient/ED visit) call.     Antionette Fairy, RN,BSN,CCM RN Care Manager Transitions of Care  [facility]  Direct Phone: [phone] Toll Free: [phone] Fax: [phone]

## 2023-07-06 NOTE — Transitions of Care (Post Inpatient/ED Visit) (Signed)
   07/06/2023  Name: [name] MRN: [id number] DOB: July 29, 1961  Today's TOC FU Call Status: Today's TOC FU Call Status:: Unsuccessful Call (3rd Attempt) Unsuccessful Call (1st Attempt) Date: 07/03/23 Unsuccessful Call (2nd Attempt) Date: 07/06/23 Unsuccessful Call (3rd Attempt) Date: 07/06/23  Attempted to reach the patient regarding the most recent Inpatient/ED visit.  Follow Up Plan: No further outreach attempts will be made at this time. We have been unable to contact the patient.  Susa Loffler , BSN, RN Care Management Coordinator [place]   [facility] [email or web address] Direct Dial: [phone]

## 2023-07-07 DIAGNOSIS — F29 Unspecified psychosis not due to a substance or known physiological condition: Secondary | ICD-10-CM | POA: Diagnosis not present

## 2023-07-24 ENCOUNTER — Ambulatory Visit: Payer: Medicare PPO | Admitting: Physician Assistant

## 2023-09-17 DIAGNOSIS — F29 Unspecified psychosis not due to a substance or known physiological condition: Secondary | ICD-10-CM | POA: Diagnosis not present

## 2023-09-24 ENCOUNTER — Encounter: Payer: Self-pay | Admitting: Family Medicine

## 2023-09-24 ENCOUNTER — Ambulatory Visit: Payer: Medicare PPO | Admitting: Family Medicine

## 2023-09-24 VITALS — BP 132/74 | HR 71 | Temp 98.7°F | Ht 60.0 in | Wt 155.2 lb

## 2023-09-24 DIAGNOSIS — H6993 Unspecified Eustachian tube disorder, bilateral: Secondary | ICD-10-CM

## 2023-09-24 DIAGNOSIS — Z9109 Other allergy status, other than to drugs and biological substances: Secondary | ICD-10-CM | POA: Diagnosis not present

## 2023-09-24 DIAGNOSIS — R051 Acute cough: Secondary | ICD-10-CM

## 2023-09-24 LAB — POCT INFLUENZA A/B
Influenza A, POC: NEGATIVE
Influenza B, POC: NEGATIVE

## 2023-09-24 LAB — POC COVID19 BINAXNOW: SARS Coronavirus 2 Ag: NEGATIVE

## 2023-09-24 NOTE — Progress Notes (Signed)
 Established Patient Office Visit   Subjective  Patient ID: [name], female    DOB: 10-11-1960  Age: 63 y.o. MRN: [id number]  Chief Complaint  Patient presents with   Cough    Cough, bilateral ear pain, sore throat, body aches, started 3 days ago     Patient is a 63 year old female followed with Dr. Jordan and seen for acute concern.  Patient endorses feeling achy, sore throat, cough, and intermittent ear pain.  Patient thought she was achy due to overdoing it cleaning a few days ago.  Also notes watery eyes due to allergy to her cat.  Patient denies fever, chills, nausea, vomiting, rhinorrhea.  Has not take anything for symptoms.  Limited and over-the-counter cold medications due to BH meds.    Patient Active Problem List   Diagnosis Date Noted   Schizoaffective disorder (HCC) 06/25/2023   Atherosclerosis of aorta (HCC) 05/22/2023   Chronic diarrhea 05/01/2023   Unstable gait 07/05/2022   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 10/10/2021   Type II diabetes mellitus 10/10/2021   Postsurgical hypothyroidism 09/03/2021   Vitamin B12 deficiency 09/03/2021   Female stress incontinence 11/15/2020   History of hysterectomy 11/15/2020   Mass of urethra 11/15/2020   Osteopenia 11/15/2020   Chronic interstitial cystitis 11/15/2020   Mild cognitive impairment of uncertain or unknown etiology 10/16/2020   Social anxiety disorder    Elevated lactic acid level    Diverticular disease of colon 08/06/2020   Fecal urgency 08/06/2020   Gastroesophageal reflux disease 08/06/2020   Irritable bowel syndrome 08/06/2020   Left upper quadrant pain 08/06/2020   Periumbilical pain 08/06/2020   Pruritus ani 08/06/2020   Chronic fatigue 06/21/2017   Hyperlipidemia, mixed 05/12/2017   Insomnia 05/12/2017   Multinodular goiter 05/12/2017   Hoarseness of voice 02/27/2016   Hyperprolactinemia 06/26/2015   Autonomic dysfunction 01/11/2015   Bipolar 1  disorder 01/11/2015   Nondiabetic gastroparesis 01/11/2015   History of lithium  toxicity 05/01/2014   Leukocytosis 05/01/2014   Costochondritis 02/14/2014   Fatty liver disease, nonalcoholic 02/13/2014   Early satiety 04/27/2013   Abnormal LFTs 04/06/2013   Dysuria 02/15/2013   Lump of breast, left 12/20/2012   Loss of weight 11/08/2012   Food aversion 11/08/2012   Constipation 05/17/2012   Jaw pain 02/13/2012   Vitamin D  deficiency 09/23/2011   Double vision 03/15/2011   Hyperglycemia 01/10/2011   Dyspnea on effort 01/10/2011   Palpitations 12/08/2010   Endometriosis 05/11/2008   Plantar fasciitis 06/11/2007   Allergic rhinitis 03/26/2007   Past Medical History:  Diagnosis Date   Abnormal LFTs 04/06/2013   Very minor tried to reassure okay to repeat today with hepatitis C screen   Allergic rhinitis 03/26/2007   Autonomic dysfunction 01/11/2015   Bipolar 1 disorder    Hx of psychosis; present since age 39   Chest pain on breathing 03/16/2014   Chronic fatigue 06/21/2017   Chronic interstitial cystitis 11/15/2020   Community acquired pneumonia of right lung 08/17/2020   Complication of anesthesia    agitation   Constipation 05/17/2012   Costochondritis 02/14/2014   Diverticular disease of colon 08/06/2020   Double vision 03/15/2011   Dyspnea on effort 01/10/2011   Dysuria 02/15/2013   Early satiety 04/27/2013   Elevated lactic acid level    Endometriosis 05/11/2008   Fatty liver disease, nonalcoholic 02/13/2014   Fecal urgency 08/06/2020   Female stress incontinence 11/15/2020   Food aversion 11/08/2012   Frequency of  urination    Gastroesophageal reflux disease 08/06/2020   Headache    History of duodenal ulcer    History of hysterectomy 11/15/2020   History of kidney stones    History of lithium  toxicity 05/01/2014   Hoarseness of voice 02/27/2016   Hyperglycemia 01/10/2011   Hyperlipidemia, mixed 05/12/2017   Hyperprolactinemia 06/26/2015   Insomnia  05/12/2017   Irritable bowel syndrome 08/06/2020   Jaw pain 02/13/2012   poss tmj area     Left upper quadrant pain 08/06/2020   Leukocytosis 05/01/2014   Lump of breast, left 12/20/2012   about 6 oclock    Mass of urethra 11/15/2020   Mild cognitive impairment of uncertain or unknown etiology 10/16/2020   Multinodular goiter 05/12/2017   Nocturia    Nondiabetic gastroparesis 01/11/2015   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 10/10/2021   Palpitations 12/08/2010   Pelvic pain    Periumbilical pain 08/06/2020   Plantar fasciitis 06/11/2007   Postsurgical hypothyroidism 09/03/2021   Pruritus ani 08/06/2020   Right ear pain 02/16/2012   Social anxiety disorder    Subacute bronchitis 07/10/2018   Type II diabetes mellitus 10/10/2021   Unstable gait 07/05/2022   Urgency of urination    Vitamin B12 deficiency 09/03/2021   Vitamin D  deficiency 09/23/2011   Past Surgical History:  Procedure Laterality Date   CARDIAC CATHETERIZATION  10-09-1999  DR KATZ   NORMAL LVF/  NORMAL RCA/  NO CRITICAL DISEASE LEFT CORONARY SYSTEM   CARDIOVASCULAR STRESS TEST  01-23-2011   NORMAL NUCLEAR STUDY/ NO ISCHEMIA/ EF 81%   CYSTOSCOPY WITH BIOPSY N/A 06/03/2013   Procedure: CYSTOSCOPY WITH BIOPSY  INSTILLATION OF MARCAINE  AND PYRIDIUM ;  Surgeon: Thomasine Oiler, MD;  Location: [facility];  Service: Urology;  Laterality: N/A;   LAPAROSCOPIC CHOLECYSTECTOMY  11-08-2001   LAPAROSCOPIC LEFT SALPINGOOPHORECTOMY AND LYSYS ADHESIONS  03-10-2002   LAPAROSCOPIC REMOVAL OVARY REMNANT  2003   [place]   LAPAROSCOPY N/A 12/14/2012   Procedure: LAPAROSCOPY DIAGNOSTIC;  Surgeon: Jina Nephew, MD;  Location: [facility];  Service: General;  Laterality: N/A;   THYROIDECTOMY N/A 05/24/2021   Procedure: TOTAL THYROIDECTOMY;  Surgeon: Eletha Boas, MD;  Location: [facility];  Service: General;  Laterality: N/A;   TOTAL ABDOMINAL HYSTERECTOMY  2000   W/ RIGHT  SALPINGOOPHORECTOMY   TRANSTHORACIC ECHOCARDIOGRAM  10-13-2010   NORMAL LVF/  EF 55-60%   Social History   Tobacco Use   Smoking status: Former    Current packs/day: 0.00    Types: Cigarettes    Start date: 1979    Quit date: 1980    Years since quitting: 45.1   Smokeless tobacco: Never   Tobacco comments:    ONLY SMOKED FOR 6 MONTHS --  QUIT  YRS AGO  Vaping Use   Vaping status: Never Used  Substance Use Topics   Alcohol  use: Not Currently    Comment: occ   Drug use: Never   Family History  Problem Relation Age of Onset   Other Mother        MAC infection   Anxiety disorder Mother    Dementia Mother        likely Alzheimer's disease; symptom onset on late 70s/early 80s   Sleep apnea Father    Depression Father    Alcohol  abuse Father    Hypertension Father    Migraines Sister        headaches   Anxiety disorder Sister    Depression Sister  Heart disease Paternal Grandmother    Hyperlipidemia Paternal Grandmother    Alcohol  abuse Paternal Grandfather    Diabetes Neg Hx    Allergies  Allergen Reactions   Morphine  And Codeine Nausea And Vomiting and Nausea Only   Ambien  [Zolpidem ] Other (See Comments)    Per patient this caused her to fall   Duloxetine Swelling    Edema   Latuda [Lurasidone Hcl]    Morphine  Nausea Only   Shingrix [Zoster Vac Recomb Adjuvanted]     Dizziness vomiting      ROS Negative unless stated above    Objective:     BP 132/74 (BP Location: Right Arm, Patient Position: Sitting, Cuff Size: Normal)   Pulse 71   Temp 98.7 F (37.1 C) (Oral)   Ht 5' (1.524 m)   Wt 155 lb 3.2 oz (70.4 kg)   SpO2 97%   BMI 30.31 kg/m  BP Readings from Last 3 Encounters:  09/24/23 132/74  06/11/23 124/72  06/01/23 (!) 100/58   Wt Readings from Last 3 Encounters:  09/24/23 155 lb 3.2 oz (70.4 kg)  06/11/23 147 lb 9.6 oz (67 kg)  05/29/23 144 lb 4 oz (65.4 kg)      Physical Exam Constitutional:      General: She is not in acute  distress.    Appearance: Normal appearance.  HENT:     Head: Normocephalic and atraumatic.     Nose: Nose normal.     Mouth/Throat:     Mouth: Mucous membranes are moist.  Cardiovascular:     Rate and Rhythm: Normal rate and regular rhythm.     Heart sounds: Normal heart sounds. No murmur heard.    No gallop.  Pulmonary:     Effort: Pulmonary effort is normal. No respiratory distress.     Breath sounds: Normal breath sounds. No wheezing, rhonchi or rales.  Skin:    General: Skin is warm and dry.  Neurological:     Mental Status: She is alert and oriented to person, place, and time.    Results for orders placed or performed in visit on 09/24/23  POC Influenza A/B  Result Value Ref Range   Influenza A, POC Negative Negative   Influenza B, POC Negative Negative  POC COVID-19 BinaxNow  Result Value Ref Range   SARS Coronavirus 2 Ag Negative Negative      Assessment & Plan:  Acute cough -     POCT Influenza A/B -     POC COVID-19 BinaxNow  Dysfunction of both eustachian tubes  Environmental allergies  Patient with acute URI symptoms x 3 days.  Discussed possible causes including viral etiology versus seasonal/environmental allergies.  Likely a combination of the 2.  COVID and flu testing negative.  Discussed supportive care with OTC medications.  Consider supplements such as Airborne or emergency to help prevent/decrease acute nasopharyngitis symptoms.  Also consider OTC antihistamine such as Allegra.  Given history of cat allergy advised to vacuum frequently, use HEPA filters in HVAC system, etc.  Given handouts.  Return if symptoms worsen or fail to improve.   Clotilda JONELLE Single, MD

## 2023-09-24 NOTE — Patient Instructions (Signed)
 You can try using Airborne or emergen-C to help prevent/decrease cold symptoms before they develop.  Either of these can be found over-the-counter at your local drugstore.  They also come in generic versions.  They are basically extra doses of vitamins.

## 2023-10-01 ENCOUNTER — Ambulatory Visit: Payer: Medicare PPO | Admitting: Internal Medicine

## 2023-10-01 NOTE — Progress Notes (Deleted)
 Name: [name]  MRN/ DOB: [id number], 1960/10/02   Age/ Sex: 63 y.o., female    PCP: [place], [name], MD   Reason for Endocrinology Evaluation: Type 2 Diabetes Mellitus     Date of Initial Endocrinology Visit: 10/10/2021    PATIENT IDENTIFIER: [name] is a 63 y.o. female with a past medical history of T2DM, dyslipidemia hypothyroidism, mild cognitive impairment. The patient presented for initial endocrinology clinic visit on 10/10/2021 for consultative assistance with her diabetes management.    HPI: [name]    Diagnosed with DM 09/2021              Hemoglobin A1c has ranged from 5.0% in 2022, peaking at 6.9% in 2023.  On her initial visit with me she had an A1c of 6.9%, she was on metformin only   Despite having chronic diarrhea that per patient was started before initiation of metformin, she opted to discontinue metformin by 04/2023 due to worsening diarrhea.  A1c at the time 6.0% and we opted to remain off glycemic agents   We discontinued metformin 04/2023 due to diarrhea, with an A1c of 6.0%  THYROID HISTORY :  She is S/P total thyroidectomy due to enlarging MNG with pathology report consistent with NIFTP on 05/24/2021.  Surgical resection is curative.  Father with thyroid cancer   On disability for Bipolar d/o      SUBJECTIVE:   During the last visit (05/01/2023): A1c 6.0%     Today (10/01/23): [name] is here for follow-up on diabetes management of thyroid disease.  She is accompanied by her spouse Danford Bad.  She checks blood sugars 1x daily. The patient has not had hypoglycemic episodes since the last clinic visit  She was diagnosed with mild cognitive impairment following neurophysiological testing 03/2023 through neurology  Patient presented to the ED with schizoaffective disorder 06/2023  Denies nausea or vomiting  Has noted weight loss    HOME ENDOCRINE REGIMEN: Metformin 500 mg daily -not taking Levothyroxine 100 mcg  daily    Statin: yes ACE-I/ARB: no Prior Diabetic Education: 03/27/2022   METER DOWNLOAD SUMMARY: n/a      DIABETIC COMPLICATIONS: Microvascular complications:   Denies: CKD, neuropathy  Last eye exam: Completed a while ago   Macrovascular complications:   Denies: CAD, PVD, CVA   PAST HISTORY: Past Medical History:  Past Medical History:  Diagnosis Date   Abnormal LFTs 04/06/2013   Very minor tried to reassure okay to repeat today with hepatitis C screen   Allergic rhinitis 03/26/2007   Autonomic dysfunction 01/11/2015   Bipolar 1 disorder    Hx of psychosis; present since age 67   Chest pain on breathing 03/16/2014   Chronic fatigue 06/21/2017   Chronic interstitial cystitis 11/15/2020   Community acquired pneumonia of right lung 08/17/2020   Complication of anesthesia    agitation   Constipation 05/17/2012   Costochondritis 02/14/2014   Diverticular disease of colon 08/06/2020   Double vision 03/15/2011   Dyspnea on effort 01/10/2011   Dysuria 02/15/2013   Early satiety 04/27/2013   Elevated lactic acid level    Endometriosis 05/11/2008   Fatty liver disease, nonalcoholic 02/13/2014   Fecal urgency 08/06/2020   Female stress incontinence 11/15/2020   Food aversion 11/08/2012   Frequency of urination    Gastroesophageal reflux disease 08/06/2020   Headache    History of duodenal ulcer    History of hysterectomy 11/15/2020   History of kidney stones  History of lithium toxicity 05/01/2014   Hoarseness of voice 02/27/2016   Hyperglycemia 01/10/2011   Hyperlipidemia, mixed 05/12/2017   Hyperprolactinemia 06/26/2015   Insomnia 05/12/2017   Irritable bowel syndrome 08/06/2020   Jaw pain 02/13/2012   poss tmj area     Left upper quadrant pain 08/06/2020   Leukocytosis 05/01/2014   Lump of breast, left 12/20/2012   about 6 oclock    Mass of urethra 11/15/2020   Mild cognitive impairment of uncertain or unknown etiology 10/16/2020   Multinodular  goiter 05/12/2017   Nocturia    Nondiabetic gastroparesis 01/11/2015   Noninvasive follicular neoplasm of thyroid with papillary-like nuclear features 10/10/2021   Palpitations 12/08/2010   Pelvic pain    Periumbilical pain 08/06/2020   Plantar fasciitis 06/11/2007   Postsurgical hypothyroidism 09/03/2021   Pruritus ani 08/06/2020   Right ear pain 02/16/2012   Social anxiety disorder    Subacute bronchitis 07/10/2018   Type II diabetes mellitus 10/10/2021   Unstable gait 07/05/2022   Urgency of urination    Vitamin B12 deficiency 09/03/2021   Vitamin D deficiency 09/23/2011   Past Surgical History:  Past Surgical History:  Procedure Laterality Date   CARDIAC CATHETERIZATION  10-09-1999  DR KATZ   NORMAL LVF/  NORMAL RCA/  NO CRITICAL DISEASE LEFT CORONARY SYSTEM   CARDIOVASCULAR STRESS TEST  01-23-2011   NORMAL NUCLEAR STUDY/ NO ISCHEMIA/ EF 81%   CYSTOSCOPY WITH BIOPSY N/A 06/03/2013   Procedure: CYSTOSCOPY WITH BIOPSY  INSTILLATION OF MARCAINE AND PYRIDIUM;  Surgeon: Lindaann Slough, MD;  Location: [facility];  Service: Urology;  Laterality: N/A;   LAPAROSCOPIC CHOLECYSTECTOMY  11-08-2001   LAPAROSCOPIC LEFT SALPINGOOPHORECTOMY AND LYSYS ADHESIONS  03-10-2002   LAPAROSCOPIC REMOVAL OVARY REMNANT  2003   [place]   LAPAROSCOPY N/A 12/14/2012   Procedure: LAPAROSCOPY DIAGNOSTIC;  Surgeon: Almond Lint, MD;  Location: [facility];  Service: General;  Laterality: N/A;   THYROIDECTOMY N/A 05/24/2021   Procedure: TOTAL THYROIDECTOMY;  Surgeon: Darnell Level, MD;  Location: [facility];  Service: General;  Laterality: N/A;   TOTAL ABDOMINAL HYSTERECTOMY  2000   W/ RIGHT SALPINGOOPHORECTOMY   TRANSTHORACIC ECHOCARDIOGRAM  10-13-2010   NORMAL LVF/  EF 55-60%    Social History:  reports that she quit smoking about 45 years ago. Her smoking use included cigarettes. She started smoking about 46 years ago. She has never used smokeless tobacco. She reports that she does not  currently use alcohol. She reports that she does not use drugs. Family History:  Family History  Problem Relation Age of Onset   Other Mother        MAC infection   Anxiety disorder Mother    Dementia Mother        likely Alzheimer's disease; symptom onset on late 70s/early 80s   Sleep apnea Father    Depression Father    Alcohol abuse Father    Hypertension Father    Migraines Sister        headaches   Anxiety disorder Sister    Depression Sister    Heart disease Paternal Grandmother    Hyperlipidemia Paternal Grandmother    Alcohol abuse Paternal Grandfather    Diabetes Neg Hx      HOME MEDICATIONS: Allergies as of 10/01/2023       Reactions   Morphine And Codeine Nausea And Vomiting, Nausea Only   Ambien [zolpidem] Other (See Comments)   Per patient this caused her to fall   Duloxetine Swelling  Edema   Latuda [lurasidone Hcl]    Morphine Nausea Only   Shingrix [zoster Vac Recomb Adjuvanted]    Dizziness vomiting        Medication List        Accurate as of October 01, 2023  7:19 AM. If you have any questions, ask your nurse or doctor.          albuterol 108 (90 Base) MCG/ACT inhaler Commonly known as: VENTOLIN HFA Inhale 2 puffs into the lungs every 6 (six) hours as needed for wheezing or shortness of breath.   aspirin 81 MG chewable tablet Commonly known as: Aspirin Childrens Chew 1 tablet (81 mg total) by mouth daily.   buPROPion 150 MG 24 hr tablet Commonly known as: WELLBUTRIN XL Take 1 tablet (150 mg total) by mouth daily.   divalproex 250 MG 24 hr tablet Commonly known as: DEPAKOTE ER Take 3 tablets (750 mg total) by mouth at bedtime.   FLUoxetine 20 MG capsule Commonly known as: PROZAC Take 1 capsule (20 mg total) by mouth daily.   hydrOXYzine 25 MG tablet Commonly known as: ATARAX Take 1 tablet (25 mg total) by mouth 3 (three) times daily as needed for anxiety.   levothyroxine 100 MCG tablet Commonly known as: SYNTHROID Take 1  tablet (100 mcg total) by mouth daily at 6 (six) AM.   meclizine 25 MG tablet Commonly known as: ANTIVERT Take 1 tablet (25 mg total) by mouth 3 (three) times daily as needed for dizziness.   multivitamin with minerals Tabs tablet Take 1 tablet by mouth daily.   omeprazole 40 MG capsule Commonly known as: PRILOSEC Take 1 capsule (40 mg total) by mouth daily.   pravastatin 20 MG tablet Commonly known as: PRAVACHOL Take 1 tablet (20 mg total) by mouth at bedtime.   risperiDONE 0.25 MG tablet Commonly known as: RISPERDAL Take 1 tablet (0.25 mg total) by mouth 2 (two) times daily at 8 am and 4 pm.   traZODone 50 MG tablet Commonly known as: DESYREL Take 1 tablet (50 mg total) by mouth at bedtime as needed for sleep.         ALLERGIES: Allergies  Allergen Reactions   Morphine And Codeine Nausea And Vomiting and Nausea Only   Ambien [Zolpidem] Other (See Comments)    Per patient this caused her to fall   Duloxetine Swelling    Edema   Latuda [Lurasidone Hcl]    Morphine Nausea Only   Shingrix [Zoster Vac Recomb Adjuvanted]     Dizziness vomiting     REVIEW OF SYSTEMS: A comprehensive ROS was conducted with the patient and is negative except as per HPI     OBJECTIVE:   VITAL SIGNS: There were no vitals taken for this visit.   PHYSICAL EXAM:  General: Pt appears well and is in NAD  Neck: General: Supple without adenopathy or carotid bruits. Thyroid:.  No goiter or nodules appreciated.   Lungs: Clear with good BS bilat   Heart: RRR   Extremities:  Lower extremities - No pretibial edema  Neuro: MS is good with appropriate affect, pt is alert and Ox3    DM foot exam: 03/30/2023 per podiatry  The skin of the feet is intact without sores or ulcerations. The pedal pulses are undetectable  The sensation is absent  to a screening 5.07, 10 gram monofilament bilaterally  DATA REVIEWED:  Lab Results  Component Value Date   HGBA1C 6.0 (A) 05/01/2023   HGBA1C 5.8  (A)  12/29/2022   HGBA1C 5.7 07/04/2022     Latest Reference Range & Units 06/25/23 06:46  Sodium 135 - 145 mmol/L 137  Potassium 3.5 - 5.1 mmol/L 4.1  Chloride 98 - 111 mmol/L 99  CO2 22 - 32 mmol/L 24  Glucose 70 - 99 mg/dL 295 (H)  BUN 8 - 23 mg/dL 15  Creatinine 6.21 - 3.08 mg/dL 6.57  Calcium 8.9 - 84.6 mg/dL 9.8  Anion gap 5 - 15  14  Alkaline Phosphatase 38 - 126 U/L 68  Albumin 3.5 - 5.0 g/dL 3.6  AST 15 - 41 U/L 26  ALT 0 - 44 U/L 28  Total Protein 6.5 - 8.1 g/dL 8.1  Total Bilirubin <9.6 mg/dL 0.8  GFR, Estimated >29 mL/min >60    Latest Reference Range & Units 06/25/23 06:46  TSH 0.350 - 4.500 uIU/mL 6.382 (H)  (H): Data is abnormally high   Thyroid Pathology 05/24/2021  FINAL MICROSCOPIC DIAGNOSIS:   A. THYROID, TOTAL THYROIDECTOMY:  -  Noninvasive follicular thyroid neoplasm with papillary-like nuclear  features (NIFTP, 0.15 cm, left)  -  Follicular adenoma (5.1 cm, right)    Old records , labs and images have been reviewed.    ASSESSMENT / PLAN / RECOMMENDATIONS:   1) Type 2 Diabetes Mellitus, optimally controlled, With out complications - Most recent A1c of 6.0%. Goal A1c < 7.0 %.     -Patient discontinued metformin due to worsening of chronic diarrhea -We have opted to remain off any glycemic agents due to an A1c of 6.0% -In the future we may consider SGLT2 inhibitors or sulfonylurea, I do not think GLP-1 agonist are an option with chronic diarrhea at this time -CMP normal  MEDICATIONS: Stop metformin  EDUCATION / INSTRUCTIONS: BG monitoring instructions: Patient is instructed to check her blood sugars 3 times a week  2) Diabetic complications:  Eye: Does not have known diabetic retinopathy.   Neuro/ Feet: Does not have known diabetic peripheral neuropathy. Renal: Patient does not have known baseline CKD. She is not on an ACEI/ARB at present.    3)Postoperative Hypothyroidism :  - TSH remains normal - Pt educated extensively on the  correct way to take levothyroxine (first thing in the morning with water, 30 minutes before eating or taking other medications). - Pt encouraged to double dose the following day if she were to miss a dose given long half-life of levothyroxine.  Medication  Continue levothyroxine 100 mcg daily      Follow-up in 6 months  Signed electronically by: Lyndle Herrlich, MD  [facility] Endocrinology  [facility] [address] [place] [place], [place] [address] Phone: [phone] FAX: [phone]   CC: [place], [name], MD [address] [place] [address] Phone: [phone]  Fax: [phone]    Return to Endocrinology clinic as below: Future Appointments  Date Time Provider Department Center  10/01/2023  9:50 AM Osama Coleson, Konrad Dolores, MD [facility] None  01/22/2024  1:40 PM [facility]NESS VISIT [facility]

## 2023-10-19 ENCOUNTER — Other Ambulatory Visit: Payer: Self-pay | Admitting: Family Medicine

## 2023-10-19 DIAGNOSIS — Z1231 Encounter for screening mammogram for malignant neoplasm of breast: Secondary | ICD-10-CM

## 2023-10-26 DIAGNOSIS — H25813 Combined forms of age-related cataract, bilateral: Secondary | ICD-10-CM | POA: Diagnosis not present

## 2023-10-29 ENCOUNTER — Ambulatory Visit

## 2023-11-13 ENCOUNTER — Ambulatory Visit: Admitting: Internal Medicine

## 2023-11-16 ENCOUNTER — Other Ambulatory Visit: Payer: Self-pay | Admitting: Family Medicine

## 2023-11-16 ENCOUNTER — Ambulatory Visit: Payer: Medicare PPO | Admitting: Podiatry

## 2023-11-16 DIAGNOSIS — E782 Mixed hyperlipidemia: Secondary | ICD-10-CM

## 2023-11-19 ENCOUNTER — Ambulatory Visit
Admission: RE | Admit: 2023-11-19 | Discharge: 2023-11-19 | Disposition: A | Discharge status: Home / Self Care | Source: Ambulatory Visit | Attending: Family Medicine | Admitting: Family Medicine

## 2023-11-19 DIAGNOSIS — Z1231 Encounter for screening mammogram for malignant neoplasm of breast: Secondary | ICD-10-CM | POA: Diagnosis not present

## 2023-11-19 DIAGNOSIS — F3131 Bipolar disorder, current episode depressed, mild: Secondary | ICD-10-CM | POA: Diagnosis not present

## 2023-11-23 ENCOUNTER — Other Ambulatory Visit: Payer: Self-pay | Admitting: Family Medicine

## 2023-11-23 DIAGNOSIS — R928 Other abnormal and inconclusive findings on diagnostic imaging of breast: Secondary | ICD-10-CM

## 2023-11-30 DIAGNOSIS — Z01818 Encounter for other preprocedural examination: Secondary | ICD-10-CM | POA: Diagnosis not present

## 2023-11-30 DIAGNOSIS — H25811 Combined forms of age-related cataract, right eye: Secondary | ICD-10-CM | POA: Diagnosis not present

## 2023-12-08 ENCOUNTER — Ambulatory Visit
Admission: RE | Admit: 2023-12-08 | Discharge: 2023-12-08 | Disposition: A | Discharge status: Home / Self Care | Source: Ambulatory Visit | Attending: Family Medicine | Admitting: Family Medicine

## 2023-12-08 ENCOUNTER — Other Ambulatory Visit: Payer: Self-pay | Admitting: Family Medicine

## 2023-12-08 DIAGNOSIS — R921 Mammographic calcification found on diagnostic imaging of breast: Secondary | ICD-10-CM | POA: Diagnosis not present

## 2023-12-08 DIAGNOSIS — R928 Other abnormal and inconclusive findings on diagnostic imaging of breast: Secondary | ICD-10-CM

## 2023-12-09 ENCOUNTER — Ambulatory Visit: Admitting: Podiatry

## 2023-12-09 ENCOUNTER — Encounter: Payer: Self-pay | Admitting: Podiatry

## 2023-12-09 VITALS — Ht 60.0 in | Wt 155.0 lb

## 2023-12-09 DIAGNOSIS — M79675 Pain in left toe(s): Secondary | ICD-10-CM

## 2023-12-09 DIAGNOSIS — B351 Tinea unguium: Secondary | ICD-10-CM

## 2023-12-09 DIAGNOSIS — M79674 Pain in right toe(s): Secondary | ICD-10-CM | POA: Diagnosis not present

## 2023-12-09 DIAGNOSIS — L603 Nail dystrophy: Secondary | ICD-10-CM | POA: Diagnosis not present

## 2023-12-09 MED ORDER — TERBINAFINE HCL 250 MG PO TABS: 250.0000 mg | ORAL_TABLET | Freq: Every day | ORAL | 0 refills | Status: DC

## 2023-12-09 NOTE — Progress Notes (Signed)
 Chief Complaint  Patient presents with   Nail Problem    Follow-Up on treatment for possible nail fungus. Prescription was taken, but the problem is still there.    Subjective: 63 y.o. female presenting today as a new patient for evaluation of thickening to the bilateral toenails that has been ongoing for several years.  Gradual onset.  She has tried topical antifungals without any improvement.  She is concerned about toenail fungus.  She presents for further treatment and evaluation  Past Medical History:  Diagnosis Date   Abnormal LFTs 04/06/2013   Very minor tried to reassure okay to repeat today with hepatitis C screen   Allergic rhinitis 03/26/2007   Autonomic dysfunction 01/11/2015   Bipolar 1 disorder    Hx of psychosis; present since age 70   Chest pain on breathing 03/16/2014   Chronic fatigue 06/21/2017   Chronic interstitial cystitis 11/15/2020   Community acquired pneumonia of right lung 08/17/2020   Complication of anesthesia    agitation   Constipation 05/17/2012   Costochondritis 02/14/2014   Diverticular disease of colon 08/06/2020   Double vision 03/15/2011   Dyspnea on effort 01/10/2011   Dysuria 02/15/2013   Early satiety 04/27/2013   Elevated lactic acid level    Endometriosis 05/11/2008   Fatty liver disease, nonalcoholic 02/13/2014   Fecal urgency 08/06/2020   Female stress incontinence 11/15/2020   Food aversion 11/08/2012   Frequency of urination    Gastroesophageal reflux disease 08/06/2020   Headache    History of duodenal ulcer    History of hysterectomy 11/15/2020   History of kidney stones    History of lithium  toxicity 05/01/2014   Hoarseness of voice 02/27/2016   Hyperglycemia 01/10/2011   Hyperlipidemia, mixed 05/12/2017   Hyperprolactinemia 06/26/2015   Insomnia 05/12/2017   Irritable bowel syndrome 08/06/2020   Jaw pain 02/13/2012   poss tmj area     Left upper quadrant pain 08/06/2020   Leukocytosis 05/01/2014   Lump of  breast, left 12/20/2012   about 6 oclock    Mass of urethra 11/15/2020   Mild cognitive impairment of uncertain or unknown etiology 10/16/2020   Multinodular goiter 05/12/2017   Nocturia    Nondiabetic gastroparesis 01/11/2015   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 10/10/2021   Palpitations 12/08/2010   Pelvic pain    Periumbilical pain 08/06/2020   Plantar fasciitis 06/11/2007   Postsurgical hypothyroidism 09/03/2021   Pruritus ani 08/06/2020   Right ear pain 02/16/2012   Social anxiety disorder    Subacute bronchitis 07/10/2018   Type II diabetes mellitus 10/10/2021   Unstable gait 07/05/2022   Urgency of urination    Vitamin B12 deficiency 09/03/2021   Vitamin D  deficiency 09/23/2011    Past Surgical History:  Procedure Laterality Date   CARDIAC CATHETERIZATION  10-09-1999  DR KATZ   NORMAL LVF/  NORMAL RCA/  NO CRITICAL DISEASE LEFT CORONARY SYSTEM   CARDIOVASCULAR STRESS TEST  01-23-2011   NORMAL NUCLEAR STUDY/ NO ISCHEMIA/ EF 81%   CYSTOSCOPY WITH BIOPSY N/A 06/03/2013   Procedure: CYSTOSCOPY WITH BIOPSY  INSTILLATION OF MARCAINE  AND PYRIDIUM ;  Surgeon: Jinny Mounts, MD;  Location: [facility];  Service: Urology;  Laterality: N/A;   LAPAROSCOPIC CHOLECYSTECTOMY  11-08-2001   LAPAROSCOPIC LEFT SALPINGOOPHORECTOMY AND LYSYS ADHESIONS  03-10-2002   LAPAROSCOPIC REMOVAL OVARY REMNANT  2003   [place]   LAPAROSCOPY N/A 12/14/2012   Procedure: LAPAROSCOPY DIAGNOSTIC;  Surgeon: Lockie Rima, MD;  Location:  [facility];  Service: General;  Laterality: N/A;   THYROIDECTOMY N/A 05/24/2021   Procedure: TOTAL THYROIDECTOMY;  Surgeon: Oralee Billow, MD;  Location: [facility];  Service: General;  Laterality: N/A;   TOTAL ABDOMINAL HYSTERECTOMY  2000   W/ RIGHT SALPINGOOPHORECTOMY   TRANSTHORACIC ECHOCARDIOGRAM  10-13-2010   NORMAL LVF/  EF 55-60%    Allergies  Allergen Reactions   Morphine  And Codeine Nausea And Vomiting and  Nausea Only   Ambien  [Zolpidem ] Other (See Comments)    Per patient this caused her to fall   Duloxetine Swelling    Edema   Latuda [Lurasidone Hcl]    Morphine  Nausea Only   Shingrix [Zoster Vac Recomb Adjuvanted]     Dizziness vomiting    03/30/2023    Objective: Physical Exam General: The patient is alert and oriented x3 in no acute distress.  Dermatology: Hyperkeratotic, discolored, thickened, onychodystrophy noted. Skin is warm, dry and supple bilateral lower extremities. Negative for open lesions or macerations.  Vascular: Palpable pedal pulses bilaterally. No edema or erythema noted. Capillary refill within normal limits.  Neurological: Grossly intact via light touch  Musculoskeletal Exam: No pedal deformity noted  Assessment: #1 Onychomycosis of toenails bilateral great toes  Plan of Care:  -Patient was evaluated.   -Mechanical debridement of nails 1-5 bilateral was performed using a nail nipper without incident or bleeding -Modest improvement with the oral Lamisil .  It does appear that the nails are beginning to grow out. -Prescription for Lamisil  250 mg #90 daily.  Patient recently had updated CMP where hepatic function was WNL -Nail biopsy was also taken and sent to pathology for fungal culture -Return to clinic 6 months   Dot Gazella, DPM [facility]  Dr. Dot Gazella, DPM    [address] [place], [place] [address]                Office [phone]  Fax [phone]

## 2023-12-10 DIAGNOSIS — E1136 Type 2 diabetes mellitus with diabetic cataract: Secondary | ICD-10-CM | POA: Diagnosis not present

## 2023-12-10 DIAGNOSIS — H25811 Combined forms of age-related cataract, right eye: Secondary | ICD-10-CM | POA: Diagnosis not present

## 2023-12-10 DIAGNOSIS — E039 Hypothyroidism, unspecified: Secondary | ICD-10-CM | POA: Diagnosis not present

## 2023-12-11 ENCOUNTER — Ambulatory Visit
Admission: RE | Admit: 2023-12-11 | Discharge: 2023-12-11 | Disposition: A | Discharge status: Home / Self Care | Source: Ambulatory Visit | Attending: Family Medicine | Admitting: Family Medicine

## 2023-12-11 DIAGNOSIS — N6321 Unspecified lump in the left breast, upper outer quadrant: Secondary | ICD-10-CM | POA: Diagnosis not present

## 2023-12-11 DIAGNOSIS — R921 Mammographic calcification found on diagnostic imaging of breast: Secondary | ICD-10-CM | POA: Diagnosis not present

## 2023-12-11 DIAGNOSIS — N6322 Unspecified lump in the left breast, upper inner quadrant: Secondary | ICD-10-CM | POA: Diagnosis not present

## 2023-12-11 DIAGNOSIS — N6489 Other specified disorders of breast: Secondary | ICD-10-CM | POA: Diagnosis not present

## 2023-12-11 DIAGNOSIS — N6082 Other benign mammary dysplasias of left breast: Secondary | ICD-10-CM | POA: Diagnosis not present

## 2023-12-11 HISTORY — PX: BREAST BIOPSY: SHX20

## 2023-12-14 ENCOUNTER — Encounter

## 2023-12-14 LAB — SURGICAL PATHOLOGY

## 2023-12-17 DIAGNOSIS — F3131 Bipolar disorder, current episode depressed, mild: Secondary | ICD-10-CM | POA: Diagnosis not present

## 2023-12-24 ENCOUNTER — Other Ambulatory Visit: Payer: Self-pay | Admitting: Podiatry

## 2023-12-24 DIAGNOSIS — E1136 Type 2 diabetes mellitus with diabetic cataract: Secondary | ICD-10-CM | POA: Diagnosis not present

## 2023-12-24 DIAGNOSIS — H25812 Combined forms of age-related cataract, left eye: Secondary | ICD-10-CM | POA: Diagnosis not present

## 2023-12-24 DIAGNOSIS — E039 Hypothyroidism, unspecified: Secondary | ICD-10-CM | POA: Diagnosis not present

## 2023-12-25 ENCOUNTER — Ambulatory Visit: Admitting: Internal Medicine

## 2023-12-30 DIAGNOSIS — F3131 Bipolar disorder, current episode depressed, mild: Secondary | ICD-10-CM | POA: Diagnosis not present

## 2024-01-04 ENCOUNTER — Ambulatory Visit: Payer: Self-pay

## 2024-01-04 NOTE — Progress Notes (Signed)
 ACUTE VISIT Chief Complaint  Patient presents with   Pelvic Pain    Patient experiencing pelvic pain and not being able to fully empty her bladder for the past couple of months. Patient also stated that the pelvic has gotten worse over the past couple of weeks.   HPI: [name] is a 63 y.o. female with PMHx significant for DM II, post surgical hypothyroidism, autonomic dysfunction, IBS,and bipolar disorder here today with her wife with above complaints. She localized pain on perineal area, ongoing for several months and recently worsening.  She also endorses urinary frequency, urgency and incontinence.  She describes her pain as a constant pressure-like sensation; pain rated a 7/10.  Negative for fever, dysuria,gross hematuria, vaginal discharge,pruritus,or erythema. She has not tried OTC treatments.  Experienced lower left abdominal pain last night, resolved. No changes to bowel habits; having one bowel movement every 2-3 days.  States her stools tend to be soft and endorses fecal urgency and occasional incontinence.   Since her last visit she has seen her psychiatrists, who she sees every 2-3 months for bipolar disorder and schizophrenic disorder.  Review of Systems  Constitutional:  Negative for activity change, appetite change and unexpected weight change.  Cardiovascular:  Negative for leg swelling.  Gastrointestinal:  Negative for abdominal pain, nausea and vomiting.  Genitourinary:  Negative for flank pain and genital sores.  Skin:  Negative for rash.  Neurological:  Negative for syncope and weakness.  Psychiatric/Behavioral:  Negative for confusion and hallucinations.   See other pertinent positives and negatives in HPI.  Current Outpatient Medications on File Prior to Visit  Medication Sig Dispense Refill   aspirin  (ASPIRIN  CHILDRENS) 81 MG chewable tablet Chew 1 tablet (81 mg total) by mouth daily. 30 tablet 0   buPROPion  (WELLBUTRIN  XL) 150 MG 24 hr tablet  Take 1 tablet (150 mg total) by mouth daily. 30 tablet 0   divalproex  (DEPAKOTE  ER) 250 MG 24 hr tablet Take 3 tablets (750 mg total) by mouth at bedtime. 30 tablet 0   FLUoxetine  (PROZAC ) 20 MG capsule Take 1 capsule (20 mg total) by mouth daily. 30 capsule 0   hydrOXYzine  (ATARAX ) 25 MG tablet Take 1 tablet (25 mg total) by mouth 3 (three) times daily as needed for anxiety. 30 tablet 0   levothyroxine  (SYNTHROID ) 100 MCG tablet Take 1 tablet (100 mcg total) by mouth daily at 6 (six) AM. 30 tablet 0   meclizine  (ANTIVERT ) 25 MG tablet Take 1 tablet (25 mg total) by mouth 3 (three) times daily as needed for dizziness. 30 tablet 0   Multiple Vitamin (MULTIVITAMIN WITH MINERALS) TABS tablet Take 1 tablet by mouth daily.     pravastatin  (PRAVACHOL ) 20 MG tablet Take 1 tablet by mouth once daily 90 tablet 2   risperiDONE  (RISPERDAL ) 0.25 MG tablet Take 1 tablet (0.25 mg total) by mouth 2 (two) times daily at 8 am and 4 pm. 60 tablet 0   terbinafine  (LAMISIL ) 250 MG tablet Take 1 tablet (250 mg total) by mouth daily. 90 tablet 0   traZODone  (DESYREL ) 50 MG tablet Take 1 tablet (50 mg total) by mouth at bedtime as needed for sleep. 30 tablet 0   omeprazole  (PRILOSEC) 40 MG capsule Take 1 capsule (40 mg total) by mouth daily. 60 capsule 0   No current facility-administered medications on file prior to visit.    Past Medical History:  Diagnosis Date   Abnormal LFTs 04/06/2013   Very minor tried to reassure  okay to repeat today with hepatitis C screen   Allergic rhinitis 03/26/2007   Autonomic dysfunction 01/11/2015   Bipolar 1 disorder    Hx of psychosis; present since age 27   Chest pain on breathing 03/16/2014   Chronic fatigue 06/21/2017   Chronic interstitial cystitis 11/15/2020   Community acquired pneumonia of right lung 08/17/2020   Complication of anesthesia    agitation   Constipation 05/17/2012   Costochondritis 02/14/2014   Diverticular disease of colon 08/06/2020   Double vision  03/15/2011   Dyspnea on effort 01/10/2011   Dysuria 02/15/2013   Early satiety 04/27/2013   Elevated lactic acid level    Endometriosis 05/11/2008   Fatty liver disease, nonalcoholic 02/13/2014   Fecal urgency 08/06/2020   Female stress incontinence 11/15/2020   Food aversion 11/08/2012   Frequency of urination    Gastroesophageal reflux disease 08/06/2020   Headache    History of duodenal ulcer    History of hysterectomy 11/15/2020   History of kidney stones    History of lithium  toxicity 05/01/2014   Hoarseness of voice 02/27/2016   Hyperglycemia 01/10/2011   Hyperlipidemia, mixed 05/12/2017   Hyperprolactinemia 06/26/2015   Insomnia 05/12/2017   Irritable bowel syndrome 08/06/2020   Jaw pain 02/13/2012   poss tmj area     Left upper quadrant pain 08/06/2020   Leukocytosis 05/01/2014   Lump of breast, left 12/20/2012   about 6 oclock    Mass of urethra 11/15/2020   Mild cognitive impairment of uncertain or unknown etiology 10/16/2020   Multinodular goiter 05/12/2017   Nocturia    Nondiabetic gastroparesis 01/11/2015   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 10/10/2021   Palpitations 12/08/2010   Pelvic pain    Periumbilical pain 08/06/2020   Plantar fasciitis 06/11/2007   Postsurgical hypothyroidism 09/03/2021   Pruritus ani 08/06/2020   Right ear pain 02/16/2012   Social anxiety disorder    Subacute bronchitis 07/10/2018   Type II diabetes mellitus 10/10/2021   Unstable gait 07/05/2022   Urgency of urination    Vitamin B12 deficiency 09/03/2021   Vitamin D  deficiency 09/23/2011   Allergies  Allergen Reactions   Morphine  And Codeine Nausea And Vomiting and Nausea Only   Ambien  [Zolpidem ] Other (See Comments)    Per patient this caused her to fall   Duloxetine Swelling    Edema   Latuda [Lurasidone Hcl]    Morphine  Nausea Only   Shingrix [Zoster Vac Recomb Adjuvanted]     Dizziness vomiting    Social History    Socioeconomic History   Marital status: Married    Spouse name: Not on file   Number of children: 0   Years of education: 16   Highest education level: Bachelor's degree (e.g., BA, AB, BS)  Occupational History   Occupation: Disability    Comment: Nurse  Tobacco Use   Smoking status: Former    Current packs/day: 0.00    Types: Cigarettes    Start date: 1979    Quit date: 1980    Years since quitting: 45.4   Smokeless tobacco: Never   Tobacco comments:    ONLY SMOKED FOR 6 MONTHS --  QUIT  YRS AGO  Vaping Use   Vaping status: Never Used  Substance and Sexual Activity   Alcohol  use: Not Currently    Comment: occ   Drug use: Never   Sexual activity: Not on file  Other Topics Concern   Not on file  Social History  Narrative   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    no tobacco   Caffeine Use: very little, three times a week   Left handed    Social Drivers of Health   Financial Resource Strain: Low Risk  (05/20/2023)   Overall Financial Resource Strain (CARDIA)    Difficulty of Paying Living Expenses: Not hard at all  Food Insecurity: No Food Insecurity (06/25/2023)   Hunger Vital Sign    Worried About Running Out of Food in the Last Year: Never true    Ran Out of Food in the Last Year: Never true  Transportation Needs: No Transportation Needs (06/25/2023)   PRAPARE - Administrator, Civil Service (Medical): No    Lack of Transportation (Non-Medical): No  Physical Activity: Insufficiently Active (05/20/2023)   Exercise Vital Sign    Days of Exercise per Week: 1 day    Minutes of Exercise per Session: 10 min  Stress: Stress Concern Present (05/20/2023)   [employer] of Occupational Health - Occupational Stress Questionnaire    Feeling of Stress : To some extent  Social Connections: Socially Isolated (05/20/2023)   Social  Connection and Isolation Panel [NHANES]    Frequency of Communication with Friends and Family: Never    Frequency of Social Gatherings with Friends and Family: Once a week    Attends Religious Services: Never    Database administrator or Organizations: No    Attends Banker Meetings: Never    Marital Status: Married    Vitals:   01/05/24 0724  BP: 110/70  Pulse: 94  Resp: 16  Temp: 98.6 F (37 C)  SpO2: 97%   Body mass index is 29.91 kg/m.  Physical Exam Vitals and nursing note reviewed. Exam conducted with a chaperone present.  Constitutional:      General: She is not in acute distress.    Appearance: She is well-developed.  HENT:     Head: Normocephalic and atraumatic.  Eyes:     Conjunctiva/sclera: Conjunctivae normal.  Cardiovascular:     Rate and Rhythm: Normal rate and regular rhythm.  Pulmonary:     Effort: Pulmonary effort is normal. No respiratory distress.     Breath sounds: Normal breath sounds.  Abdominal:     Palpations: Abdomen is soft. There is no mass.     Tenderness: There is no abdominal tenderness.  Genitourinary:    Exam position: Lithotomy position.     Labia:        Right: No rash, tenderness, lesion or injury.        Left: No rash, tenderness, lesion or injury.      Urethra: No urethral pain, urethral swelling or urethral lesion.     Vagina: No vaginal discharge, erythema, tenderness or lesions.     Comments: ? Of minimal cystocele noted with valsalva. No urine leakage. Lymphadenopathy:     Cervical: No cervical adenopathy.  Skin:    General: Skin is warm.     Findings: No erythema.  Neurological:     Mental Status: She is alert. Mental status is at baseline.     Comments: Mildly unstable gait, not assisted.  Psychiatric:        Mood and Affect: Mood and affect normal.   ASSESSMENT AND PLAN: Ms. Ruchy Wildrick Chhim was seen today for vaginal pain.  Urinary frequency She reports problem as new for a couple of months and the  feeling of not been able to empty her bladder. UA sent to evaluate for infectious process. We discussed possible etiologies. Reviewing records she has hx of IC. She has an appt with urologist later 01/2024. Monitor for new symptoms.  -     Urinalysis w microscopic + reflex cultur  Female perineal pressure ?  Cystocele. She also reports occasional stool incontinence. She will benefit fro pelvic floor therapy, will defer this to urologist. Kegel and pelvic floor exercises recommended. Further recommendation will be given according to your results.  Schizoaffective disorder, bipolar type [facility]) Assessment & Plan: Follows with psychiatrist every 2-3 months.  I spent a total of 34 minutes in both face to face and non face to face activities for this visit on the date of this encounter. During this time history was obtained and documented, examination was performed, prior labs reviewed, and assessment/plan discussed.  Return if symptoms worsen or fail to improve, for keep next appointment.  I, Bernita Bristle, acting as a scribe for Saeed Toren [place], MD., have documented all relevant documentation on the behalf of Kristena Wilhelmi [place], MD, as directed by   while in the presence of Zadkiel Dragan [place], MD.  I, Torre Pikus [place], MD, have reviewed all documentation for this visit. The documentation on 01/05/24 for the exam, diagnosis, procedures, and orders are all accurate and complete.  Mendi Constable G. [place], MD  [facility]. [place] office.

## 2024-01-04 NOTE — Telephone Encounter (Signed)
  Chief Complaint: Pelvic Pain Symptoms: unable to fully empty bladder, pelvic pain Frequency: pelvic pain has increased over the last couple of weeks. Pertinent Negatives: Patient denies fever, abdominal pain Disposition: [] ED /[] Urgent Care (no appt availability in office) / [x] Appointment(In office/virtual)/ []  [facility]/ [] Home Care/ [] Refused Recommended Disposition /[] [facility]/ []  Follow-up with PCP Additional Notes: patient calling with concerns for pelvic pain. Patient states she has been having issues with being unable to completely empty her bladder for the past couple of months. Patient states pelvic pain had been going on during the same amount of time but has increased over the last couple of weeks. Patient's pain level is 7 out of 10. Per protocol, patient is recommended to be seen within 24 hours. Patient scheduled for an appointment with PCP for 01/05/2024 at 7:30 AM. Patient verbalized understanding and all questions answered.    Copied from CRM [id number]. Topic: Clinical - Red Word Triage >> Jan 04, 2024 10:56 AM Alethia Huxley E wrote: [facility] that prompted transfer to Nurse Triage: Pelvic pain. Patient experiencing pelvic pain and not being able to fully empty her bladder for the past couple of months. Patient also stated that the pelvic has gotten worse over the past couple of weeks. Reason for Disposition  [1] MODERATE (e.g., interferes with normal activities) pelvic pain AND [2] pain comes and goes (cramps) AND [3] present > 24 hours  Answer Assessment - Initial Assessment Questions 1. LOCATION: "Where does it hurt?"      Pelvic region 2. RADIATION: "Does the pain shoot anywhere else?" (e.g., lower back, groin, thighs)     no 3. ONSET: "When did the pain begin?" (e.g., minutes, hours or days ago)      Couple of months 4. SUDDEN: "Gradual or sudden onset?"     gradual 5. PATTERN "Does the pain come and go, or is it constant?"    - If constant: "Is  it getting better, staying the same, or worsening?"      (Note: Constant means the pain never goes away completely; most serious pain is constant and gets worse over time)     - If intermittent: "How long does it last?" "Do you have pain now?"     (Note: Intermittent means the pain goes away completely between bouts)     Comes and goes 6. SEVERITY: "How bad is the pain?"  (e.g., Scale 1-10; mild, moderate, or severe)   - MILD (1-3): doesn't interfere with normal activities, area soft and not tender to touch    - MODERATE (4-7): interferes with normal activities or awakens from sleep, abdomen tender to touch    - SEVERE (8-10): excruciating pain, doubled over, unable to do any normal activities      7 out of 10 7. RECURRENT SYMPTOM: "Have you ever had this type of pelvic pain before?" If Yes, ask: "When was the last time?" and "What happened that time?"      Yes- been going on for a couple of months 8. CAUSE: "What do you think is causing the pelvic pain?"     Unable to empty bladder completely 9. RELIEVING/AGGRAVATING FACTORS: "What makes it better or worse?" (e.g., activity/rest, sexual intercourse, voiding, passing stool)     No not necessarily 10. OTHER SYMPTOMS: "Has there been any other symptoms?" (e.g., fever, constipation, diarrhea, urine problems, vaginal bleeding, vaginal discharge, or vomiting?"       no  Protocols used: Pelvic Pain - Female-A-AH

## 2024-01-05 ENCOUNTER — Encounter: Payer: Self-pay | Admitting: Family Medicine

## 2024-01-05 ENCOUNTER — Ambulatory Visit (INDEPENDENT_AMBULATORY_CARE_PROVIDER_SITE_OTHER): Admitting: Family Medicine

## 2024-01-05 VITALS — BP 110/70 | HR 94 | Temp 98.6°F | Resp 16 | Ht 60.0 in | Wt 153.1 lb

## 2024-01-05 DIAGNOSIS — N949 Unspecified condition associated with female genital organs and menstrual cycle: Secondary | ICD-10-CM | POA: Diagnosis not present

## 2024-01-05 DIAGNOSIS — F250 Schizoaffective disorder, bipolar type: Secondary | ICD-10-CM | POA: Diagnosis not present

## 2024-01-05 DIAGNOSIS — R350 Frequency of micturition: Secondary | ICD-10-CM

## 2024-01-05 NOTE — Assessment & Plan Note (Signed)
 Follows with psychiatrist every 2-3 months.

## 2024-01-05 NOTE — Patient Instructions (Addendum)
 A few things to remember from today's visit:  Urinary frequency - Plan: Urinalysis with Culture Reflex  Female perineal pressure Question of small cystocele. Keep appointment with urologist.  This exercise helps with mild urine leakage associated with cough, laughing, or sneezing. It may help with other types of urine incontinence and even with mild fecal incontinence.   Tighten and relax the pelvic muscles intermittently during the day. Once you are familiar with exercise try to hold pelvic muscles contraction for about 8-10 seconds.in the beginning you may not be able to hold contraction for more than a second or 2 but eventually you will be able to hold contraction harder and for longer time. Perform  8-12 exercises 3 times per day and daily for 15-20 weeks. You will need to continue exercises indefinitely to have a lasting effect.  If you need refills for medications you take chronically, please call your pharmacy. Do not use My Chart to request refills or for acute issues that need immediate attention. If you send a my chart message, it may take a few days to be addressed, specially if I am not in the office.  Please be sure medication list is accurate. If a new problem present, please set up appointment sooner than planned today.

## 2024-01-06 LAB — URINALYSIS W MICROSCOPIC + REFLEX CULTURE

## 2024-01-12 ENCOUNTER — Other Ambulatory Visit: Payer: Self-pay

## 2024-01-12 DIAGNOSIS — R350 Frequency of micturition: Secondary | ICD-10-CM

## 2024-01-22 ENCOUNTER — Telehealth: Payer: Self-pay

## 2024-01-22 NOTE — Telephone Encounter (Signed)
 Unsuccessful attempts to reach patient on preferred number listed in notes for scheduled AWV. Left message on voicemail okay to reschedule.

## 2024-02-04 DIAGNOSIS — F3131 Bipolar disorder, current episode depressed, mild: Secondary | ICD-10-CM | POA: Diagnosis not present

## 2024-03-09 ENCOUNTER — Ambulatory Visit

## 2024-03-10 DIAGNOSIS — F29 Unspecified psychosis not due to a substance or known physiological condition: Secondary | ICD-10-CM | POA: Diagnosis not present

## 2024-03-11 ENCOUNTER — Ambulatory Visit: Admitting: Internal Medicine

## 2024-03-11 ENCOUNTER — Encounter: Payer: Self-pay | Admitting: Internal Medicine

## 2024-03-11 VITALS — BP 120/82 | HR 76 | Ht 60.0 in | Wt 154.0 lb

## 2024-03-11 DIAGNOSIS — E890 Postprocedural hypothyroidism: Secondary | ICD-10-CM

## 2024-03-11 DIAGNOSIS — E1169 Type 2 diabetes mellitus with other specified complication: Secondary | ICD-10-CM

## 2024-03-11 LAB — POCT GLYCOSYLATED HEMOGLOBIN (HGB A1C): Hemoglobin A1C: 6.4 % — AB (ref 4.0–5.6)

## 2024-03-11 LAB — POCT GLUCOSE (DEVICE FOR HOME USE): POC Glucose: 119 mg/dL — AB (ref 70–99)

## 2024-03-11 LAB — TSH: TSH: 0.36 m[IU]/L — ABNORMAL LOW (ref 0.40–4.50)

## 2024-03-11 LAB — T4, FREE: Free T4: 1.3 ng/dL (ref 0.8–1.8)

## 2024-03-11 NOTE — Patient Instructions (Signed)

## 2024-03-11 NOTE — Progress Notes (Signed)
 Name: [name]  MRN/ DOB: [id number], 06-14-1961   Age/ Sex: 63 y.o., female    PCP: [place], [name], MD   Reason for Endocrinology Evaluation: Type 2 Diabetes Mellitus     Date of Initial Endocrinology Visit: 10/10/2021    PATIENT IDENTIFIER: [name] is a 63 y.o. female with a past medical history of T2DM, dyslipidemia hypothyroidism, mild cognitive impairment. The patient presented for initial endocrinology clinic visit on 10/10/2021 for consultative assistance with her diabetes management.    HPI: [name]    Diagnosed with DM 09/2021              Hemoglobin A1c has ranged from 5.0% in 2022, peaking at 6.9% in 2023.  On her initial visit with me she had an A1c of 6.9%, she was on metformin  only   Despite having chronic diarrhea that per patient was started before initiation of metformin , she opted to discontinue metformin  by 04/2023 due to worsening diarrhea.  A1c at the time 6.0% and we opted to remain off glycemic agents     THYROID  HISTORY :  She is S/P total thyroidectomy due to enlarging MNG with pathology report consistent with NIFTP on 05/24/2021.  Surgical resection is curative.  Father with thyroid  cancer   On disability for Bipolar d/o    SUBJECTIVE:   During the last visit (05/01/2023): A1c 6.0%     Today (03/11/24): [name] is here for follow-up on diabetes management of thyroid  disease.  She is accompanied by her spouse [name].  She has NOT been to our clinic in 10 months. She checks blood sugars occasionally .   She had a follow-up with podiatry 12/09/2023 Patient continues to follow-up with behavioral health  Denies nausea or vomiting  Denies constipation or diarrhea  Denies local neck swelling  Denies palpitations  No LE swelling   HOME ENDOCRINE REGIMEN: Levothyroxine  100 mcg daily    Statin: yes ACE-I/ARB: no Prior Diabetic Education: 03/27/2022   METER DOWNLOAD SUMMARY: n/a      DIABETIC  COMPLICATIONS: Microvascular complications:   Denies: CKD, neuropathy  Last eye exam: Completed 10/26/2023  Macrovascular complications:   Denies: CAD, PVD, CVA   PAST HISTORY: Past Medical History:  Past Medical History:  Diagnosis Date   Abnormal LFTs 04/06/2013   Very minor tried to reassure okay to repeat today with hepatitis C screen   Allergic rhinitis 03/26/2007   Autonomic dysfunction 01/11/2015   Bipolar 1 disorder    Hx of psychosis; present since age 18   Chest pain on breathing 03/16/2014   Chronic fatigue 06/21/2017   Chronic interstitial cystitis 11/15/2020   Community acquired pneumonia of right lung 08/17/2020   Complication of anesthesia    agitation   Constipation 05/17/2012   Costochondritis 02/14/2014   Diverticular disease of colon 08/06/2020   Double vision 03/15/2011   Dyspnea on effort 01/10/2011   Dysuria 02/15/2013   Early satiety 04/27/2013   Elevated lactic acid level    Endometriosis 05/11/2008   Fatty liver disease, nonalcoholic 02/13/2014   Fecal urgency 08/06/2020   Female stress incontinence 11/15/2020   Food aversion 11/08/2012   Frequency of urination    Gastroesophageal reflux disease 08/06/2020   Headache    History of duodenal ulcer    History of hysterectomy 11/15/2020   History of kidney stones    History of lithium  toxicity 05/01/2014   Hoarseness of voice 02/27/2016   Hyperglycemia 01/10/2011   Hyperlipidemia, mixed 05/12/2017  Hyperprolactinemia 06/26/2015   Insomnia 05/12/2017   Irritable bowel syndrome 08/06/2020   Jaw pain 02/13/2012   poss tmj area     Left upper quadrant pain 08/06/2020   Leukocytosis 05/01/2014   Lump of breast, left 12/20/2012   about 6 oclock    Mass of urethra 11/15/2020   Mild cognitive impairment of uncertain or unknown etiology 10/16/2020   Multinodular goiter 05/12/2017   Nocturia    Nondiabetic gastroparesis 01/11/2015   Noninvasive follicular neoplasm of thyroid  with  papillary-like nuclear features 10/10/2021   Palpitations 12/08/2010   Pelvic pain    Periumbilical pain 08/06/2020   Plantar fasciitis 06/11/2007   Postsurgical hypothyroidism 09/03/2021   Pruritus ani 08/06/2020   Right ear pain 02/16/2012   Social anxiety disorder    Subacute bronchitis 07/10/2018   Type II diabetes mellitus 10/10/2021   Unstable gait 07/05/2022   Urgency of urination    Vitamin B12 deficiency 09/03/2021   Vitamin D  deficiency 09/23/2011   Past Surgical History:  Past Surgical History:  Procedure Laterality Date   BREAST BIOPSY Left 12/11/2023   MM LT BREAST BX W LOC DEV EA AD LESION IMG BX SPEC STEREO GUIDE 12/11/2023 [facility]   BREAST BIOPSY Left 12/11/2023   MM LT BREAST BX W LOC DEV 1ST LESION IMAGE BX SPEC STEREO GUIDE 12/11/2023 [facility]   CARDIAC CATHETERIZATION  10-09-1999  DR KATZ   NORMAL LVF/  NORMAL RCA/  NO CRITICAL DISEASE LEFT CORONARY SYSTEM   CARDIOVASCULAR STRESS TEST  01-23-2011   NORMAL NUCLEAR STUDY/ NO ISCHEMIA/ EF 81%   CYSTOSCOPY WITH BIOPSY N/A 06/03/2013   Procedure: CYSTOSCOPY WITH BIOPSY  INSTILLATION OF MARCAINE  AND PYRIDIUM ;  Surgeon: Thomasine Oiler, MD;  Location: [facility];  Service: Urology;  Laterality: N/A;   LAPAROSCOPIC CHOLECYSTECTOMY  11-08-2001   LAPAROSCOPIC LEFT SALPINGOOPHORECTOMY AND LYSYS ADHESIONS  03-10-2002   LAPAROSCOPIC REMOVAL OVARY REMNANT  2003   [place]   LAPAROSCOPY N/A 12/14/2012   Procedure: LAPAROSCOPY DIAGNOSTIC;  Surgeon: Jina Nephew, MD;  Location: [facility];  Service: General;  Laterality: N/A;   THYROIDECTOMY N/A 05/24/2021   Procedure: TOTAL THYROIDECTOMY;  Surgeon: Eletha Boas, MD;  Location: [facility];  Service: General;  Laterality: N/A;   TOTAL ABDOMINAL HYSTERECTOMY  2000   W/ RIGHT SALPINGOOPHORECTOMY   TRANSTHORACIC ECHOCARDIOGRAM  10-13-2010   NORMAL LVF/  EF 55-60%    Social History:  reports that she quit smoking about 45 years ago. Her smoking use  included cigarettes. She started smoking about 46 years ago. She has never used smokeless tobacco. She reports that she does not currently use alcohol . She reports that she does not use drugs. Family History:  Family History  Problem Relation Age of Onset   Other Mother        MAC infection   Anxiety disorder Mother    Dementia Mother        likely Alzheimer's disease; symptom onset on late 70s/early 80s   Sleep apnea Father    Depression Father    Alcohol  abuse Father    Hypertension Father    Migraines Sister        headaches   Anxiety disorder Sister    Depression Sister    Heart disease Paternal Grandmother    Hyperlipidemia Paternal Grandmother    Alcohol  abuse Paternal Grandfather    Diabetes Neg Hx      HOME MEDICATIONS: Allergies as of 03/11/2024       Reactions  Morphine  And Codeine Nausea And Vomiting, Nausea Only   Ambien  [zolpidem ] Other (See Comments)   Per patient this caused her to fall   Duloxetine Swelling   Edema   Latuda [lurasidone Hcl]    Morphine  Nausea Only   Shingrix [zoster Vac Recomb Adjuvanted]    Dizziness vomiting        Medication List        Accurate as of March 11, 2024 11:53 AM. If you have any questions, ask your nurse or doctor.          aspirin  81 MG chewable tablet Commonly known as: Aspirin  Childrens Chew 1 tablet (81 mg total) by mouth daily.   buPROPion  150 MG 24 hr tablet Commonly known as: WELLBUTRIN  XL Take 1 tablet (150 mg total) by mouth daily.   divalproex  250 MG 24 hr tablet Commonly known as: DEPAKOTE  ER Take 3 tablets (750 mg total) by mouth at bedtime.   FLUoxetine  20 MG capsule Commonly known as: PROZAC  Take 1 capsule (20 mg total) by mouth daily.   hydrOXYzine  25 MG tablet Commonly known as: ATARAX  Take 1 tablet (25 mg total) by mouth 3 (three) times daily as needed for anxiety.   levothyroxine  100 MCG tablet Commonly known as: SYNTHROID  Take 1 tablet (100 mcg total) by mouth daily at 6 (six)  AM.   meclizine  25 MG tablet Commonly known as: ANTIVERT  Take 1 tablet (25 mg total) by mouth 3 (three) times daily as needed for dizziness.   multivitamin with minerals Tabs tablet Take 1 tablet by mouth daily.   omeprazole  40 MG capsule Commonly known as: PRILOSEC Take 1 capsule (40 mg total) by mouth daily.   pravastatin  20 MG tablet Commonly known as: PRAVACHOL  Take 1 tablet by mouth once daily   risperiDONE  0.25 MG tablet Commonly known as: RISPERDAL  Take 1 tablet (0.25 mg total) by mouth 2 (two) times daily at 8 am and 4 pm.   terbinafine  250 MG tablet Commonly known as: LAMISIL  Take 1 tablet (250 mg total) by mouth daily.   traZODone  50 MG tablet Commonly known as: DESYREL  Take 1 tablet (50 mg total) by mouth at bedtime as needed for sleep.         ALLERGIES: Allergies  Allergen Reactions   Morphine  And Codeine Nausea And Vomiting and Nausea Only   Ambien  [Zolpidem ] Other (See Comments)    Per patient this caused her to fall   Duloxetine Swelling    Edema   Latuda [Lurasidone Hcl]    Morphine  Nausea Only   Shingrix [Zoster Vac Recomb Adjuvanted]     Dizziness vomiting     REVIEW OF SYSTEMS: A comprehensive ROS was conducted with the patient and is negative except as per HPI     OBJECTIVE:   VITAL SIGNS: BP 120/82 (BP Location: Left Arm, Patient Position: Sitting, Cuff Size: Normal)   Pulse 76   Ht 5' (1.524 m)   Wt 154 lb (69.9 kg)   SpO2 96%   BMI 30.08 kg/m    PHYSICAL EXAM:  General: Pt appears well and is in NAD  Neck: General: Supple without adenopathy or carotid bruits. Thyroid :.  No goiter or nodules appreciated.   Lungs: Clear with good BS bilat   Heart: RRR   Extremities:  Lower extremities - No pretibial edema  Neuro: MS is good with appropriate affect, pt is alert and Ox3    DM foot exam: 12/09/2023 per podiatry    DATA REVIEWED:  Lab Results  Component Value Date   HGBA1C 6.0 (A) 05/01/2023   HGBA1C 5.8 (A)  12/29/2022   HGBA1C 5.7 07/04/2022      Latest Reference Range & Units 03/11/24 12:04  TSH 0.40 - 4.50 mIU/L 0.36 (L)  T4,Free(Direct) 0.8 - 1.8 ng/dL 1.3       Thyroid  Pathology 05/24/2021  FINAL MICROSCOPIC DIAGNOSIS:   A. THYROID , TOTAL THYROIDECTOMY:  -  Noninvasive follicular thyroid  neoplasm with papillary-like nuclear  features (NIFTP, 0.15 cm, left)  -  Follicular adenoma (5.1 cm, right)    Old records , labs and images have been reviewed.    ASSESSMENT / PLAN / RECOMMENDATIONS:   1) Type 2 Diabetes Mellitus, optimally controlled, With out complications - Most recent A1c of 6.4%. Goal A1c < 7.0 %.    -A1c remains optimal -Intolerant to metformin  due to chronic diarrhea - Encouraged to continue with lifestyle changes - Pt is past due for labs, per spouse she has a physician in the next few weeks with PCP and labs will be drawn than  MEDICATIONS: N/a  EDUCATION / INSTRUCTIONS: BG monitoring instructions: Patient is instructed to check her blood sugars 3 times a week  2) Diabetic complications:  Eye: Does not have known diabetic retinopathy.   Neuro/ Feet: Does not have known diabetic peripheral neuropathy. Renal: Patient does not have known baseline CKD. She is not on an ACEI/ARB at present.    3)Postoperative Hypothyroidism :  - Patient is clinically euthyroid - TFTs showed low TSH, her TFTs have been fluctuating either due to imperfect adherence to levothyroxine  or due to variable absorption - Since she is asymptomatic, I will not change the dose and we will continue to monitor    Medication Continue levothyroxine  100 mcg daily   Follow-up in 6 months  Signed electronically by: Stefano Redgie Butts, MD  [facility]  [facility] [address] [place] [place], [place] [address] Phone: [phone] FAX: [phone]   CC: [place], [name], MD [address] [place] [address] Phone: [phone]   Fax: [phone]    Return to Endocrinology clinic as below: Future Appointments  Date Time Provider Department Center  03/15/2024  8:30 AM [place], [name], MD [id number]  06/13/2024  4:15 PM Janit Thresa HERO, DPM [facility]

## 2024-03-14 ENCOUNTER — Ambulatory Visit: Payer: Self-pay | Admitting: Internal Medicine

## 2024-03-15 ENCOUNTER — Encounter: Admitting: Family Medicine

## 2024-04-15 ENCOUNTER — Ambulatory Visit: Admitting: Family

## 2024-04-15 ENCOUNTER — Encounter: Payer: Self-pay | Admitting: Family

## 2024-04-15 VITALS — BP 119/75 | HR 80 | Temp 97.7°F | Ht 60.0 in | Wt 154.4 lb

## 2024-04-15 DIAGNOSIS — R350 Frequency of micturition: Secondary | ICD-10-CM | POA: Diagnosis not present

## 2024-04-15 DIAGNOSIS — R339 Retention of urine, unspecified: Secondary | ICD-10-CM

## 2024-04-15 LAB — POCT URINALYSIS DIPSTICK
Bilirubin, UA: NEGATIVE
Blood, UA: NEGATIVE
Glucose, UA: NEGATIVE
Ketones, UA: NEGATIVE
Leukocytes, UA: NEGATIVE
Nitrite, UA: NEGATIVE
Protein, UA: NEGATIVE
Spec Grav, UA: >=1.03 — AB (ref 1.010–1.025)
Urobilinogen, UA: 0.2 U/dL
pH, UA: 6 (ref 5.0–8.0)

## 2024-04-15 MED ORDER — MIRABEGRON ER 50 MG PO TB24: 50.0000 mg | ORAL_TABLET | Freq: Every day | ORAL | Status: DC

## 2024-04-15 NOTE — Progress Notes (Signed)
 Patient ID: [name], female    DOB: 1960/12/19, 63 y.o.   MRN: [id number]  Chief Complaint  Patient presents with   Urinary Frequency    Pt c/o Urinary frequency and dysuria, present for 1 week.   Discussed the use of AI scribe software for clinical note transcription with the patient, who gave verbal consent to proceed.  History of Present Illness   [name] Luz Mares is a 63 year old female who presents with urinary frequency, burning, and incontinence.  Lower urinary tract symptoms - Urinary frequency, burning, and incontinence for the past week - Difficulty completely emptying bladder - Frequent urge to urinate shortly after voiding - Urine appears darker than usual despite high fluid intake (approximately ten cups of water  daily)  History of urinary retention - Previous episodes of similar urinary symptoms - Prior urology evaluation with confirmed urine retention issues - Uncertain of specific treatment recommendations from prior evaluation as many years ago    Assessment & Plan:     Overactive bladder with urinary frequency, incomplete emptying, and urinary incontinence Urinary incontinence and frequency with incomplete bladder emptying suggest overactive bladder. UA in office negative. Previous urology evaluation confirmed urine retention. No current medication. - Provided Myrbetriq  samples at 25 mg for trial due to efficacy in managing overactive bladder without anticholinergic side effects. - Instructed to check insurance coverage for Myrbetriq  due to possible high cost and variable copay. - Advised follow-up with primary care provider to discuss trial results and potential prescription. - Consider increasing dose to 50 mg if 25 mg is effective and well-tolerated. - F/U with PCP for ongoing RX  Left ear fullness Sensation of fullness in the left ear possibly due to water  retention in the ear canal. Eardrum normal. Mild cerumen noted on edges, could be  retaining water .  -  Advised to soak ears in shower with warm water  for a minute, then gently shake out, do not administer Q-tips into ear, only on outer edges to remove visible wax.     Subjective:    Outpatient Medications Prior to Visit  Medication Sig Dispense Refill   aspirin  (ASPIRIN  CHILDRENS) 81 MG chewable tablet Chew 1 tablet (81 mg total) by mouth daily. 30 tablet 0   buPROPion  (WELLBUTRIN  XL) 150 MG 24 hr tablet Take 1 tablet (150 mg total) by mouth daily. 30 tablet 0   divalproex  (DEPAKOTE  ER) 250 MG 24 hr tablet Take 3 tablets (750 mg total) by mouth at bedtime. 30 tablet 0   levothyroxine  (SYNTHROID ) 100 MCG tablet Take 1 tablet (100 mcg total) by mouth daily at 6 (six) AM. 30 tablet 0   meclizine  (ANTIVERT ) 25 MG tablet Take 1 tablet (25 mg total) by mouth 3 (three) times daily as needed for dizziness. 30 tablet 0   Multiple Vitamin (MULTIVITAMIN WITH MINERALS) TABS tablet Take 1 tablet by mouth daily.     pravastatin  (PRAVACHOL ) 20 MG tablet Take 1 tablet by mouth once daily 90 tablet 2   risperiDONE  (RISPERDAL ) 0.25 MG tablet Take 1 tablet (0.25 mg total) by mouth 2 (two) times daily at 8 am and 4 pm. 60 tablet 0   FLUoxetine  (PROZAC ) 20 MG capsule Take 1 capsule (20 mg total) by mouth daily. 30 capsule 0   hydrOXYzine  (ATARAX ) 25 MG tablet Take 1 tablet (25 mg total) by mouth 3 (three) times daily as needed for anxiety. 30 tablet 0   omeprazole  (PRILOSEC) 40 MG capsule Take 1 capsule (40 mg  total) by mouth daily. 60 capsule 0   terbinafine  (LAMISIL ) 250 MG tablet Take 1 tablet (250 mg total) by mouth daily. 90 tablet 0   traZODone  (DESYREL ) 50 MG tablet Take 1 tablet (50 mg total) by mouth at bedtime as needed for sleep. 30 tablet 0   No facility-administered medications prior to visit.   Past Medical History:  Diagnosis Date   Abnormal LFTs 04/06/2013   Very minor tried to reassure okay to repeat today with hepatitis C screen   Allergic rhinitis 03/26/2007   Autonomic  dysfunction 01/11/2015   Bipolar 1 disorder    Hx of psychosis; present since age 17   Chest pain on breathing 03/16/2014   Chronic fatigue 06/21/2017   Chronic interstitial cystitis 11/15/2020   Community acquired pneumonia of right lung 08/17/2020   Complication of anesthesia    agitation   Constipation 05/17/2012   Costochondritis 02/14/2014   Diverticular disease of colon 08/06/2020   Double vision 03/15/2011   Dyspnea on effort 01/10/2011   Dysuria 02/15/2013   Early satiety 04/27/2013   Elevated lactic acid level    Endometriosis 05/11/2008   Fatty liver disease, nonalcoholic 02/13/2014   Fecal urgency 08/06/2020   Female stress incontinence 11/15/2020   Food aversion 11/08/2012   Frequency of urination    Gastroesophageal reflux disease 08/06/2020   Headache    History of duodenal ulcer    History of hysterectomy 11/15/2020   History of kidney stones    History of lithium  toxicity 05/01/2014   Hoarseness of voice 02/27/2016   Hyperglycemia 01/10/2011   Hyperlipidemia, mixed 05/12/2017   Hyperprolactinemia 06/26/2015   Insomnia 05/12/2017   Irritable bowel syndrome 08/06/2020   Jaw pain 02/13/2012   poss tmj area     Left upper quadrant pain 08/06/2020   Leukocytosis 05/01/2014   Lump of breast, left 12/20/2012   about 6 oclock    Mass of urethra 11/15/2020   Mild cognitive impairment of uncertain or unknown etiology 10/16/2020   Multinodular goiter 05/12/2017   Nocturia    Nondiabetic gastroparesis 01/11/2015   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 10/10/2021   Palpitations 12/08/2010   Pelvic pain    Periumbilical pain 08/06/2020   Plantar fasciitis 06/11/2007   Postsurgical hypothyroidism 09/03/2021   Pruritus ani 08/06/2020   Right ear pain 02/16/2012   Social anxiety disorder    Subacute bronchitis 07/10/2018   Type II diabetes mellitus 10/10/2021   Unstable gait 07/05/2022   Urgency of urination     Vitamin B12 deficiency 09/03/2021   Vitamin D  deficiency 09/23/2011   Past Surgical History:  Procedure Laterality Date   BREAST BIOPSY Left 12/11/2023   MM LT BREAST BX W LOC DEV EA AD LESION IMG BX SPEC STEREO GUIDE 12/11/2023 [facility]   BREAST BIOPSY Left 12/11/2023   MM LT BREAST BX W LOC DEV 1ST LESION IMAGE BX SPEC STEREO GUIDE 12/11/2023 [facility]   CARDIAC CATHETERIZATION  10-09-1999  DR KATZ   NORMAL LVF/  NORMAL RCA/  NO CRITICAL DISEASE LEFT CORONARY SYSTEM   CARDIOVASCULAR STRESS TEST  01-23-2011   NORMAL NUCLEAR STUDY/ NO ISCHEMIA/ EF 81%   CYSTOSCOPY WITH BIOPSY N/A 06/03/2013   Procedure: CYSTOSCOPY WITH BIOPSY  INSTILLATION OF MARCAINE  AND PYRIDIUM ;  Surgeon: Thomasine Oiler, MD;  Location: [facility];  Service: Urology;  Laterality: N/A;   LAPAROSCOPIC CHOLECYSTECTOMY  11-08-2001   LAPAROSCOPIC LEFT SALPINGOOPHORECTOMY AND LYSYS ADHESIONS  03-10-2002   LAPAROSCOPIC REMOVAL  OVARY REMNANT  2003   [place]   LAPAROSCOPY N/A 12/14/2012   Procedure: LAPAROSCOPY DIAGNOSTIC;  Surgeon: Jina Nephew, MD;  Location: [facility];  Service: General;  Laterality: N/A;   THYROIDECTOMY N/A 05/24/2021   Procedure: TOTAL THYROIDECTOMY;  Surgeon: Eletha Boas, MD;  Location: [facility];  Service: General;  Laterality: N/A;   TOTAL ABDOMINAL HYSTERECTOMY  2000   W/ RIGHT SALPINGOOPHORECTOMY   TRANSTHORACIC ECHOCARDIOGRAM  10-13-2010   NORMAL LVF/  EF 55-60%   Allergies  Allergen Reactions   Morphine  And Codeine Nausea And Vomiting and Nausea Only   Ambien  [Zolpidem ] Other (See Comments)    Per patient this caused her to fall   Duloxetine Swelling    Edema   Latuda [Lurasidone Hcl]    Morphine  Nausea Only   Shingrix [Zoster Vac Recomb Adjuvanted]     Dizziness vomiting      Objective:    Physical Exam Vitals and nursing note reviewed.  Constitutional:      Appearance: Normal appearance.  Cardiovascular:     Rate and Rhythm: Normal rate and regular  rhythm.  Pulmonary:     Effort: Pulmonary effort is normal.     Breath sounds: Normal breath sounds.  Musculoskeletal:        General: Normal range of motion.  Skin:    General: Skin is warm and dry.  Neurological:     Mental Status: She is alert.  Psychiatric:        Mood and Affect: Mood normal.        Behavior: Behavior normal.    BP 119/75 (BP Location: Left Arm, Patient Position: Sitting, Cuff Size: Large)   Pulse 80   Temp 97.7 F (36.5 C) (Temporal)   Ht 5' (1.524 m)   Wt 154 lb 6.4 oz (70 kg)   SpO2 96%   BMI 30.15 kg/m  Wt Readings from Last 3 Encounters:  04/15/24 154 lb 6.4 oz (70 kg)  03/11/24 154 lb (69.9 kg)  01/05/24 153 lb 2 oz (69.5 kg)       Lucius Krabbe, NP

## 2024-04-19 ENCOUNTER — Telehealth: Payer: Self-pay | Admitting: Family Medicine

## 2024-04-19 DIAGNOSIS — D229 Melanocytic nevi, unspecified: Secondary | ICD-10-CM

## 2024-04-19 NOTE — Telephone Encounter (Signed)
 Referral placed.

## 2024-04-19 NOTE — Telephone Encounter (Signed)
 Copied from CRM [id number]. Topic: Referral - Request for Referral >> Apr 19, 2024 12:18 PM Dedra B wrote: Did the patient discuss referral with their provider in the last year? Yes said she briefly discussed it with her  Appointment offered? No since previously discussed   Type of order/referral and detailed reason for visit: Dermatology for mole on R side of neck and under R breast  Preference of office, provider, location: No  If referral order, have you been seen by this specialty before? No   Can we respond through MyChart? Yes

## 2024-04-20 ENCOUNTER — Ambulatory Visit (INDEPENDENT_AMBULATORY_CARE_PROVIDER_SITE_OTHER)

## 2024-04-20 VITALS — Ht 60.0 in | Wt 154.0 lb

## 2024-04-20 DIAGNOSIS — Z0000 Encounter for general adult medical examination without abnormal findings: Secondary | ICD-10-CM

## 2024-04-20 NOTE — Patient Instructions (Addendum)
 [name] , Thank you for taking time out of your busy schedule to complete your Annual Wellness Visit with me. I enjoyed our conversation and look forward to speaking with you again next year. I, as well as your care team,  appreciate your ongoing commitment to your health goals. Please review the following plan we discussed and let me know if I can assist you in the future. Your Game plan/ To Do List    Referrals: If you haven't heard from the office you've been referred to, please reach out to them at the phone provided.   Follow up Visits: We will see or speak with you next year for your Next Medicare AWV with our clinical staff 04/26/25 @ 10:40a  Have you seen your provider in the last 6 months (3 months if uncontrolled diabetes)?  Next appointment with provider 04/29/24 @ 8:30a  Clinician Recommendations:  Aim for 30 minutes of exercise or brisk walking, 6-8 glasses of water , and 5 servings of fruits and vegetables each day.       This is a list of the screenings recommended for you:  Health Maintenance  Topic Date Due   Yearly kidney health urinalysis for diabetes  Never done   Pneumococcal Vaccine for age over 34 (2 of 2 - PCV) 07/03/2019   Flu Shot  03/18/2024   COVID-19 Vaccine (6 - 2025-26 season) 04/18/2024   Complete foot exam   03/29/2024   Yearly kidney function blood test for diabetes  06/24/2024   Hemoglobin A1C  09/11/2024   Eye exam for diabetics  10/25/2024   Medicare Annual Wellness Visit  04/20/2025   Mammogram  12/07/2025   Colon Cancer Screening  09/01/2032   Hepatitis C Screening  Completed   HIV Screening  Completed   Zoster (Shingles) Vaccine  Completed   Hepatitis B Vaccine  Aged Out   HPV Vaccine  Aged Out   Meningitis B Vaccine  Aged Out   DTaP/Tdap/Td vaccine  Discontinued    Advanced directives: (Provided) Advance directive discussed with you today. I have provided a copy for you to complete at home and have notarized. Once this is complete,  please bring a copy in to our office so we can scan it into your chart.  Advance Care Planning is important because it:  [x]  Makes sure you receive the medical care that is consistent with your values, goals, and preferences  [x]  It provides guidance to your family and loved ones and reduces their decisional burden about whether or not they are making the right decisions based on your wishes.  Follow the link provided in your after visit summary or read over the paperwork we have mailed to you to help you started getting your Advance Directives in place. If you need assistance in completing these, please reach out to us  so that we can help you!  See attachments for Preventive Care and Fall Prevention Tips.

## 2024-04-20 NOTE — Progress Notes (Signed)
 Subjective:   [name] is a 63 y.o. who presents for a Medicare Wellness preventive visit.  As a reminder, Annual Wellness Visits don't include a physical exam, and some assessments may be limited, especially if this visit is performed virtually. We may recommend an in-person follow-up visit with your provider if needed.  Visit Complete: Virtual I connected with  [name] on 04/20/24 by a audio enabled telemedicine application and verified that I am speaking with the correct person using two identifiers.  Patient Location: Home  Provider Location: Home Office  I discussed the limitations of evaluation and management by telemedicine. The patient expressed understanding and agreed to proceed.  Vital Signs: Because this visit was a virtual/telehealth visit, some criteria may be missing or patient reported. Any vitals not documented were not able to be obtained and vitals that have been documented are patient reported.    Persons Participating in Visit: Patient.  AWV Questionnaire: No: Patient Medicare AWV questionnaire was not completed prior to this visit.  Cardiac Risk Factors include: advanced age (>27men, >38 women);diabetes mellitus     Objective:    Today's Vitals   04/20/24 1046 04/20/24 1047  Weight: 154 lb (69.9 kg)   Height: 5' (1.524 m)   PainSc:  0-No pain   Body mass index is 30.08 kg/m.     04/20/2024   10:57 AM 06/25/2023    7:00 PM 05/18/2023    6:49 PM 05/14/2023    1:33 PM 05/11/2023   11:33 AM 05/11/2023   11:20 AM 04/08/2023    8:52 AM  Advanced Directives  Does Patient Have a Medical Advance Directive? No  No No No No No  Would patient like information on creating a medical advance directive? No - Patient declined  No - Patient declined No - Patient declined        Information is confidential and restricted. Go to Review Flowsheets to unlock data.    Current Medications (verified) Outpatient Encounter Medications as of 04/20/2024   Medication Sig   aspirin  (ASPIRIN  CHILDRENS) 81 MG chewable tablet Chew 1 tablet (81 mg total) by mouth daily.   buPROPion  (WELLBUTRIN  XL) 150 MG 24 hr tablet Take 1 tablet (150 mg total) by mouth daily.   divalproex  (DEPAKOTE  ER) 250 MG 24 hr tablet Take 3 tablets (750 mg total) by mouth at bedtime.   FLUoxetine  (PROZAC ) 20 MG capsule Take 1 capsule (20 mg total) by mouth daily.   hydrOXYzine  (ATARAX ) 25 MG tablet Take 1 tablet (25 mg total) by mouth 3 (three) times daily as needed for anxiety.   levothyroxine  (SYNTHROID ) 100 MCG tablet Take 1 tablet (100 mcg total) by mouth daily at 6 (six) AM.   meclizine  (ANTIVERT ) 25 MG tablet Take 1 tablet (25 mg total) by mouth 3 (three) times daily as needed for dizziness.   mirabegron  ER (MYRBETRIQ ) 50 MG TB24 tablet Take 1 tablet (50 mg total) by mouth daily. Take 1/2 pill daily.   Multiple Vitamin (MULTIVITAMIN WITH MINERALS) TABS tablet Take 1 tablet by mouth daily.   omeprazole  (PRILOSEC) 40 MG capsule Take 1 capsule (40 mg total) by mouth daily.   pravastatin  (PRAVACHOL ) 20 MG tablet Take 1 tablet by mouth once daily   risperiDONE  (RISPERDAL ) 0.25 MG tablet Take 1 tablet (0.25 mg total) by mouth 2 (two) times daily at 8 am and 4 pm.   terbinafine  (LAMISIL ) 250 MG tablet Take 1 tablet (250 mg total) by mouth daily.   traZODone  (DESYREL )  50 MG tablet Take 1 tablet (50 mg total) by mouth at bedtime as needed for sleep.   No facility-administered encounter medications on file as of 04/20/2024.    Allergies (verified) Morphine  and codeine, Ambien  [zolpidem ], Duloxetine, Latuda [lurasidone hcl], Morphine , and Shingrix [zoster vac recomb adjuvanted]   History: Past Medical History:  Diagnosis Date   Abnormal LFTs 04/06/2013   Very minor tried to reassure okay to repeat today with hepatitis C screen   Allergic rhinitis 03/26/2007   Autonomic dysfunction 01/11/2015   Bipolar 1 disorder    Hx of psychosis; present since age 78   Chest pain on  breathing 03/16/2014   Chronic fatigue 06/21/2017   Chronic interstitial cystitis 11/15/2020   Community acquired pneumonia of right lung 08/17/2020   Complication of anesthesia    agitation   Constipation 05/17/2012   Costochondritis 02/14/2014   Diverticular disease of colon 08/06/2020   Double vision 03/15/2011   Dyspnea on effort 01/10/2011   Dysuria 02/15/2013   Early satiety 04/27/2013   Elevated lactic acid level    Endometriosis 05/11/2008   Fatty liver disease, nonalcoholic 02/13/2014   Fecal urgency 08/06/2020   Female stress incontinence 11/15/2020   Food aversion 11/08/2012   Frequency of urination    Gastroesophageal reflux disease 08/06/2020   Headache    History of duodenal ulcer    History of hysterectomy 11/15/2020   History of kidney stones    History of lithium  toxicity 05/01/2014   Hoarseness of voice 02/27/2016   Hyperglycemia 01/10/2011   Hyperlipidemia, mixed 05/12/2017   Hyperprolactinemia 06/26/2015   Insomnia 05/12/2017   Irritable bowel syndrome 08/06/2020   Jaw pain 02/13/2012   poss tmj area     Left upper quadrant pain 08/06/2020   Leukocytosis 05/01/2014   Lump of breast, left 12/20/2012   about 6 oclock    Mass of urethra 11/15/2020   Mild cognitive impairment of uncertain or unknown etiology 10/16/2020   Multinodular goiter 05/12/2017   Nocturia    Nondiabetic gastroparesis 01/11/2015   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 10/10/2021   Palpitations 12/08/2010   Pelvic pain    Periumbilical pain 08/06/2020   Plantar fasciitis 06/11/2007   Postsurgical hypothyroidism 09/03/2021   Pruritus ani 08/06/2020   Right ear pain 02/16/2012   Social anxiety disorder    Subacute bronchitis 07/10/2018   Type II diabetes mellitus 10/10/2021   Unstable gait 07/05/2022   Urgency of urination    Vitamin B12 deficiency 09/03/2021   Vitamin D  deficiency 09/23/2011   Past Surgical History:  Procedure  Laterality Date   BREAST BIOPSY Left 12/11/2023   MM LT BREAST BX W LOC DEV EA AD LESION IMG BX SPEC STEREO GUIDE 12/11/2023 [facility]   BREAST BIOPSY Left 12/11/2023   MM LT BREAST BX W LOC DEV 1ST LESION IMAGE BX SPEC STEREO GUIDE 12/11/2023 [facility]   CARDIAC CATHETERIZATION  10-09-1999  DR KATZ   NORMAL LVF/  NORMAL RCA/  NO CRITICAL DISEASE LEFT CORONARY SYSTEM   CARDIOVASCULAR STRESS TEST  01-23-2011   NORMAL NUCLEAR STUDY/ NO ISCHEMIA/ EF 81%   CYSTOSCOPY WITH BIOPSY N/A 06/03/2013   Procedure: CYSTOSCOPY WITH BIOPSY  INSTILLATION OF MARCAINE  AND PYRIDIUM ;  Surgeon: Thomasine Oiler, MD;  Location: [facility];  Service: Urology;  Laterality: N/A;   LAPAROSCOPIC CHOLECYSTECTOMY  11-08-2001   LAPAROSCOPIC LEFT SALPINGOOPHORECTOMY AND LYSYS ADHESIONS  03-10-2002   LAPAROSCOPIC REMOVAL OVARY REMNANT  2003   [place]  LAPAROSCOPY N/A 12/14/2012   Procedure: LAPAROSCOPY DIAGNOSTIC;  Surgeon: Jina Nephew, MD;  Location: [facility];  Service: General;  Laterality: N/A;   THYROIDECTOMY N/A 05/24/2021   Procedure: TOTAL THYROIDECTOMY;  Surgeon: Eletha Boas, MD;  Location: [facility];  Service: General;  Laterality: N/A;   TOTAL ABDOMINAL HYSTERECTOMY  2000   W/ RIGHT SALPINGOOPHORECTOMY   TRANSTHORACIC ECHOCARDIOGRAM  10-13-2010   NORMAL LVF/  EF 55-60%   Family History  Problem Relation Age of Onset   Other Mother        MAC infection   Anxiety disorder Mother    Dementia Mother        likely Alzheimer's disease; symptom onset on late 70s/early 80s   Sleep apnea Father    Depression Father    Alcohol  abuse Father    Hypertension Father    Migraines Sister        headaches   Anxiety disorder Sister    Depression Sister    Heart disease Paternal Grandmother    Hyperlipidemia Paternal Grandmother    Alcohol  abuse Paternal Grandfather    Diabetes Neg Hx    Social History   Socioeconomic History   Marital status: Married    Spouse name: Not on file    Number of children: 0   Years of education: 16   Highest education level: Bachelor's degree (e.g., BA, AB, BS)  Occupational History   Occupation: Disability    Comment: Nurse  Tobacco Use   Smoking status: Former    Current packs/day: 0.00    Types: Cigarettes    Start date: 1979    Quit date: 1980    Years since quitting: 45.7   Smokeless tobacco: Never   Tobacco comments:    ONLY SMOKED FOR 6 MONTHS --  QUIT  YRS AGO  Vaping Use   Vaping status: Never Used  Substance and Sexual Activity   Alcohol  use: Not Currently    Comment: occ   Drug use: Never   Sexual activity: Not on file  Other Topics Concern   Not on file  Social History Narrative   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    no tobacco   Caffeine Use: very little, three times a week   Left handed    Social Drivers of Health   Financial Resource Strain: Low Risk  (04/20/2024)   Overall Financial Resource Strain (CARDIA)    Difficulty of Paying Living Expenses: Not hard at all  Food Insecurity: No Food Insecurity (04/20/2024)   Hunger Vital Sign    Worried About Running Out of Food in the Last Year: Never true    Ran Out of Food in the Last Year: Never true  Transportation Needs: No Transportation Needs (04/20/2024)   PRAPARE - Administrator, Civil Service (Medical): No    Lack of Transportation (Non-Medical): No  Physical Activity: Sufficiently Active (04/20/2024)   Exercise Vital Sign    Days of Exercise per Week: 7 days    Minutes of Exercise per Session: 30 min  Recent Concern: Physical Activity - Insufficiently Active (03/14/2024)   Exercise Vital Sign    Days of Exercise per Week: 3 days    Minutes of Exercise per Session: 10 min  Stress: No Stress Concern Present (04/20/2024)   [employer] of Occupational Health - Occupational Stress Questionnaire  Feeling of  Stress: Not at all  Social Connections: Moderately Isolated (04/20/2024)   Social Connection and Isolation Panel    Frequency of Communication with Friends and Family: More than three times a week    Frequency of Social Gatherings with Friends and Family: More than three times a week    Attends Religious Services: Never    Database administrator or Organizations: No    Attends Engineer, structural: Never    Marital Status: Married    Tobacco Counseling Counseling given: Not Answered Tobacco comments: ONLY SMOKED FOR 6 MONTHS --  QUIT  YRS AGO    Clinical Intake:  Pre-visit preparation completed: Yes  Pain : No/denies pain Pain Score: 0-No pain     BMI - recorded: 30.08 Nutritional Status: BMI > 30  Obese Nutritional Risks: None Diabetes: Yes CBG done?: No Did pt. bring in CBG monitor from home?: No  Lab Results  Component Value Date   HGBA1C 6.4 (A) 03/11/2024   HGBA1C 6.0 (A) 05/01/2023   HGBA1C 5.8 (A) 12/29/2022     How often do you need to have someone help you when you read instructions, pamphlets, or other written materials from your doctor or pharmacy?: 1 - Never  Interpreter Needed?: No  Information entered by :: Rojelio Blush LPN   Activities of Daily Living     04/20/2024   10:52 AM 01/18/2024   10:42 PM  In your present state of health, do you have any difficulty performing the following activities:  Hearing? 0 0  Vision? 0 1  Difficulty concentrating or making decisions? 1 1  Comment Cognitive impairment. Followed by medical attention   Walking or climbing stairs? 0 1  Dressing or bathing? 0 0  Doing errands, shopping? 0 1  Preparing Food and eating ? N Y  Using the Toilet? N Y  In the past six months, have you accidently leaked urine? N Y  Do you have problems with loss of bowel control? N Y  Managing your Medications? [name] [name]  Comment Wife assist   Managing your Finances? N Y  Housekeeping or managing your Housekeeping? N Y     Patient Care Team: [place], [name], MD as PCP - General (Family Medicine) Ladona Heinz, MD as PCP - Cardiology (Cardiology) Tasia Lung, MD (Psychiatry) Kristie Lamprey, MD as Attending Physician (Gastroenterology) Rosemarie Eather RAMAN, MD as Consulting Physician (Neurology) Aleene Kitchens, MD (Inactive) as Consulting Physician (Urology) Burnard Debby LABOR, MD (Inactive) as Consulting Physician (Cardiology) Rosalia Hollering, MD as Referring Physician (Neurology) Kandyce Burnard, MD as Consulting Physician (Obstetrics and Gynecology) Lyndon Arbour, MD as Consulting Physician (Neurology) Patel, Donika K, DO as Consulting Physician (Neurology) [facility], [place] (Ophthalmology)  I have updated your Care Teams any recent Medical Services you may have received from other providers in the past year.     Assessment:   This is a routine wellness examination for [facility].  Hearing/Vision screen Hearing Screening - Comments:: Denies hearing difficulties   Vision Screening - Comments:: Wears rx glasses - up to date with routine [facility] exams with  [facility]   Goals Addressed               This Visit's Progress     Increase physical activity (pt-stated)        Stay active       Depression Screen     04/20/2024   10:52 AM 06/11/2023    1:13 PM 01/14/2023  3:16 PM 01/14/2023    3:14 PM 10/03/2022    7:55 AM 07/04/2022    9:33 AM 12/27/2021    3:16 PM  PHQ 2/9 Scores  PHQ - 2 Score 0 2 2 0 3 0 2  PHQ- 9 Score  2 2  7  4     Fall Risk     04/20/2024   10:56 AM 01/18/2024   10:42 PM 06/11/2023    1:13 PM 04/08/2023    8:51 AM 01/14/2023    3:19 PM  Fall Risk   Falls in the past year? 1 1 0 1 1  Number falls in past yr: 0 1 0 0 0  Injury with Fall? 0 0 0 0 0  Risk for fall due to : No Fall Risks  No Fall Risks  No Fall Risks  Follow up Falls evaluation completed  Falls evaluation completed Falls evaluation completed Falls prevention discussed    MEDICARE RISK AT HOME:   Medicare Risk at Home Any stairs in or around the home?: Yes If so, are there any without handrails?: No Home free of loose throw rugs in walkways, pet beds, electrical cords, etc?: Yes Adequate lighting in your home to reduce risk of falls?: Yes Life alert?: No Use of a cane, walker or w/c?: No Grab bars in the bathroom?: No Shower chair or bench in shower?: No Elevated toilet seat or a handicapped toilet?: No  TIMED UP AND GO:  Was the test performed?  No  Cognitive Function: 6CIT completed      04/23/2022   11:17 AM 04/23/2022   11:00 AM  Montreal Cognitive Assessment   Visuospatial/ Executive (0/5) 2 2  Naming (0/3) 3 3  Attention: Read list of digits (0/2) 2 2  Attention: Read list of letters (0/1) 1 1  Attention: Serial 7 subtraction starting at 100 (0/3) 3 3  Language: Repeat phrase (0/2) 2 2  Language : Fluency (0/1) 0 0  Abstraction (0/2) 2 2  Delayed Recall (0/5) 3 3  Orientation (0/6) 6 6  Total 24 24  Adjusted Score (based on education) 24 24      04/20/2024   10:58 AM 01/14/2023    3:21 PM 12/27/2021    3:29 PM 12/05/2020    2:07 PM  6CIT Screen  What Year? 0 points 0 points 0 points 4 points  What month? 0 points 0 points 0 points 3 points  What time? 0 points 0 points 0 points   Count back from 20 0 points 0 points 0 points 0 points  Months in reverse 0 points 4 points 0 points 4 points  Repeat phrase 0 points 0 points 0 points 4 points  Total Score 0 points 4 points 0 points     Immunizations Immunization History  Administered Date(s) Administered   Influenza Split 07/18/2011   Influenza, Quadrivalent, Recombinant, Inj, Pf 05/19/2019   Influenza,inj,Quad PF,6+ Mos 07/02/2018, 08/06/2020   Influenza-Unspecified 06/06/2021   Moderna Sars-Covid-2 Vaccination 10/17/2019, 11/17/2019, 06/29/2020   Pfizer Covid-19 Vaccine Bivalent Booster 8yrs & up 06/06/2021, 06/12/2022   Pneumococcal Polysaccharide-23 07/02/2018   Td 06/13/2009   Tdap 06/13/2009    Zoster Recombinant(Shingrix) 10/11/2021, 06/12/2022    Screening Tests Health Maintenance  Topic Date Due   Diabetic kidney evaluation - Urine ACR  Never done   Pneumococcal Vaccine: 50+ Years (2 of 2 - PCV) 07/03/2019   INFLUENZA VACCINE  03/18/2024   COVID-19 Vaccine (6 - 2025-26 season) 04/18/2024   FOOT EXAM  03/29/2024   Diabetic kidney evaluation - eGFR measurement  06/24/2024   HEMOGLOBIN A1C  09/11/2024   OPHTHALMOLOGY EXAM  10/25/2024   Medicare Annual Wellness (AWV)  04/20/2025   MAMMOGRAM  12/07/2025   Colonoscopy  09/01/2032   Hepatitis C Screening  Completed   HIV Screening  Completed   Zoster Vaccines- Shingrix  Completed   Hepatitis B Vaccines 19-59 Average Risk  Aged Out   HPV VACCINES  Aged Out   Meningococcal B Vaccine  Aged Out   DTaP/Tdap/Td  Discontinued    Health Maintenance  Health Maintenance Due  Topic Date Due   Diabetic kidney evaluation - Urine ACR  Never done   Pneumococcal Vaccine: 50+ Years (2 of 2 - PCV) 07/03/2019   INFLUENZA VACCINE  03/18/2024   COVID-19 Vaccine (6 - 2025-26 season) 04/18/2024   FOOT EXAM  03/29/2024   Health Maintenance Items Addressed:   Additional Screening:  Vision Screening: Recommended annual ophthalmology exams for early detection of glaucoma and other disorders of the eye. Would you like a referral to an eye doctor? No    Dental Screening: Recommended annual dental exams for proper oral hygiene  Community Resource Referral / Chronic Care Management: CRR required this visit?  No   CCM required this visit?  No   Plan:    I have personally reviewed and noted the following in the patient's chart:   Medical and social history Use of alcohol , tobacco or illicit drugs  Current medications and supplements including opioid prescriptions. Patient is not currently taking opioid prescriptions. Functional ability and status Nutritional status Physical activity Advanced directives List of other  physicians Hospitalizations, surgeries, and ER visits in previous 12 months Vitals Screenings to include cognitive, depression, and falls Referrals and appointments  In addition, I have reviewed and discussed with patient certain preventive protocols, quality metrics, and best practice recommendations. A written personalized care plan for preventive services as well as general preventive health recommendations were provided to patient.   Rojelio LELON Blush, LPN   0/01/7973   After Visit Summary: (MyChart) Due to this being a telephonic visit, the after visit summary with patients personalized plan was offered to patient via MyChart   Notes: Nothing significant to report at this time.

## 2024-04-22 ENCOUNTER — Other Ambulatory Visit: Payer: Self-pay | Admitting: Family Medicine

## 2024-04-22 DIAGNOSIS — R350 Frequency of micturition: Secondary | ICD-10-CM

## 2024-04-22 DIAGNOSIS — R339 Retention of urine, unspecified: Secondary | ICD-10-CM

## 2024-04-22 NOTE — Telephone Encounter (Signed)
 Copied from CRM [id number]. Topic: Clinical - Medication Refill >> Apr 22, 2024 11:53 AM Deleta S wrote: Medication: mirabegron  ER (MYRBETRIQ ) 50 MG TB24 tablet [[id number]]  Has the patient contacted their pharmacy? No (Agent: If no, request that the patient contact the pharmacy for the refill. If patient does not wish to contact the pharmacy document the reason why and proceed with request.) (Agent: If yes, when and what did the pharmacy advise?)  This is the patient's preferred pharmacy:  [id number] - [place], [place] - [address] [place] [address] Phone: [phone] Fax: [phone]  Is this the correct pharmacy for this prescription? Yes If no, delete pharmacy and type the correct one.   Has the prescription been filled recently? Yes, sample would like prescription   Is the patient out of the medication? No  Has the patient been seen for an appointment in the last year OR does the patient have an upcoming appointment? Yes  Can we respond through MyChart? Yes  Agent: Please be advised that Rx refills may take up to 3 business days. We ask that you follow-up with your pharmacy.

## 2024-04-27 ENCOUNTER — Telehealth: Payer: Self-pay | Admitting: *Deleted

## 2024-04-27 NOTE — Telephone Encounter (Signed)
 Will discuss this problem during follow-up appointment. [name] Sylvan [place], MD

## 2024-04-27 NOTE — Telephone Encounter (Signed)
 Copied from CRM [id number]. Topic: Clinical - Medication Question >> Apr 27, 2024  2:38 PM Chiquita SQUIBB wrote: Reason for CRM: Patient is requesting more samples for the mirabegron  ER (MYRBETRIQ ) 50 MG TB24 tablet, patient was given samples at her last appointment and is asking if Dr. Gib office has any more.

## 2024-04-28 NOTE — Progress Notes (Signed)
 "  Chief Complaint  Patient presents with   Annual Exam    Pt is with spouse- Tully. Pt is fasting. They reports pt has issue urinating frequently, feeling not empty complete. Took Myrbetriq  sample from [facility]. Need more sample or a Rx.      Discussed the use of AI scribe software for clinical note transcription with the patient, who gave verbal consent to proceed.  History of Present Illness [name] is a 63 year old female with PMHx significant for DM II, post surgical hypothyroidism, autonomic dysfunction, IBS,and bipolar disorder  who is here today with her wife for her routine physical.  Last CPE 10/03/22.  She engages in regular physical activity, including walking her dog four to five times a day and using an elliptical machine once or twice daily for about five minutes each session.  She tries to eat vegetables daily and has a mixed diet of eating at home and dining out.  She sleeps approximately six to seven hours per night, although sometimes she wakes up and goes back to sleep.  She does not consume alcohol  or smoke, having quit smoking in 1980.   She is currently taking Myrbetriq  50 mg 1/2 tab daily, which was prescribed for urinary frequency and helps her symptoms without side effects. She was initially given samples of 50 mg tablets, which she has been splitting. She has a history of cystocele, which seems to be stable.  She has had three falls this year.   DEXA ordered by gynecologist in 05/2021: Site Region Measured Date Measured Age YA BMD Significant CHANGE T-score AP Spine  L1-L4 (L2) 05/21/2021    59.3         -2.5    0.873 g/cm2   DualFemur Neck Right 05/21/2021    59.3         -2.0    0.766 g/cm2   DualFemur Total Mean 05/21/2021    59.3         -1.2    0.862 g/cm2  She is not on pharmacologic treatment for osteoporosis.  Immunization History  Administered Date(s) Administered   Influenza Split 07/18/2011   Influenza,  Quadrivalent, Recombinant, Inj, Pf 05/19/2019   Influenza,inj,Quad PF,6+ Mos 07/02/2018, 08/06/2020   Influenza-Unspecified 06/06/2021   Moderna Sars-Covid-2 Vaccination 10/17/2019, 11/17/2019, 06/29/2020   Pfizer Covid-19 Vaccine Bivalent Booster 60yrs & up 06/06/2021, 06/12/2022   Pneumococcal Polysaccharide-23 07/02/2018   Td 06/13/2009   Tdap 06/13/2009   Zoster Recombinant(Shingrix) 10/11/2021, 06/12/2022   Health Maintenance  Topic Date Due   Diabetic kidney evaluation - Urine ACR  Never done   Pneumococcal Vaccine: 50+ Years (2 of 2 - PCV) 07/03/2019   Influenza Vaccine  03/18/2024   FOOT EXAM  03/29/2024   COVID-19 Vaccine (6 - 2025-26 season) 04/18/2024   Diabetic kidney evaluation - eGFR measurement  06/24/2024   HEMOGLOBIN A1C  09/11/2024   OPHTHALMOLOGY EXAM  10/25/2024   Medicare Annual Wellness (AWV)  04/20/2025   Mammogram  12/07/2025   Colonoscopy  09/01/2032   Hepatitis C Screening  Completed   HIV Screening  Completed   Zoster Vaccines- Shingrix  Completed   Hepatitis B Vaccines 19-59 Average Risk  Aged Out   HPV VACCINES  Aged Out   Meningococcal B Vaccine  Aged Out   DTaP/Tdap/Td  Discontinued   DM II and post surgical hypothyroidism: Follows with endocrinologist. Lab Results  Component Value Date   HGBA1C 6.4 (A) 03/11/2024   HLD:  She is on Pravastatin  20 mg daily.  Component     Latest Ref Rng 08/19/2022  Cholesterol     0 - 200 mg/dL 866   Triglycerides     0.0 - 149.0 mg/dL 769.9 (H)   HDL Cholesterol     >39.00 mg/dL 58.39   VLDL     0.0 - 40.0 mg/dL 53.9 (H)   Total CHOL/HDL Ratio 3   NonHDL 90.95     Site Region Measured Date Measured Age YA BMD Significant CHANGE T-score AP Spine  L1-L4 (L2) 05/21/2021    59.3         -2.5    0.873 g/cm2   DualFemur Neck Right 05/21/2021    59.3         -2.0    0.766 g/cm2   DualFemur Total Mean 05/21/2021    59.3         -1.2    0.862 g/cm2  Review of Systems  Constitutional:  Positive for  fatigue. Negative for activity change, appetite change, chills and fever.  HENT:  Negative for mouth sores, sore throat and trouble swallowing.   Eyes:  Negative for redness and visual disturbance.  Respiratory:  Negative for cough, shortness of breath and wheezing.   Cardiovascular:  Negative for chest pain and leg swelling.  Gastrointestinal:  Negative for abdominal pain, nausea and vomiting.  Endocrine: Negative for cold intolerance, heat intolerance, polydipsia, polyphagia and polyuria.  Genitourinary:  Negative for decreased urine volume, dysuria and hematuria.  Musculoskeletal:  Positive for arthralgias and gait problem. Negative for myalgias.  Skin:  Negative for color change and rash.  Allergic/Immunologic: Positive for environmental allergies.  Neurological:  Negative for syncope, weakness and headaches.  Hematological:  Negative for adenopathy. Does not bruise/bleed easily.  Psychiatric/Behavioral:  Negative for confusion and hallucinations.   All other systems reviewed and are negative.  Current Outpatient Medications on File Prior to Visit  Medication Sig Dispense Refill   aspirin  (ASPIRIN  CHILDRENS) 81 MG chewable tablet Chew 1 tablet (81 mg total) by mouth daily. 30 tablet 0   buPROPion  (WELLBUTRIN  XL) 150 MG 24 hr tablet Take 1 tablet (150 mg total) by mouth daily. 30 tablet 0   divalproex  (DEPAKOTE  ER) 250 MG 24 hr tablet Take 3 tablets (750 mg total) by mouth at bedtime. 30 tablet 0   FLUoxetine  (PROZAC ) 20 MG capsule Take 1 capsule (20 mg total) by mouth daily. 30 capsule 0   hydrOXYzine  (ATARAX ) 25 MG tablet Take 1 tablet (25 mg total) by mouth 3 (three) times daily as needed for anxiety. 30 tablet 0   levothyroxine  (SYNTHROID ) 100 MCG tablet Take 1 tablet (100 mcg total) by mouth daily at 6 (six) AM. 30 tablet 0   meclizine  (ANTIVERT ) 25 MG tablet Take 1 tablet (25 mg total) by mouth 3 (three) times daily as needed for dizziness. 30 tablet 0   mirabegron  ER (MYRBETRIQ ) 50  MG TB24 tablet Take 1 tablet (50 mg total) by mouth daily. Take 1/2 pill daily.     Multiple Vitamin (MULTIVITAMIN WITH MINERALS) TABS tablet Take 1 tablet by mouth daily.     omeprazole  (PRILOSEC) 40 MG capsule Take 1 capsule (40 mg total) by mouth daily. 60 capsule 0   pravastatin  (PRAVACHOL ) 20 MG tablet Take 1 tablet by mouth once daily 90 tablet 2   risperiDONE  (RISPERDAL ) 0.25 MG tablet Take 1 tablet (0.25 mg total) by mouth 2 (two) times daily at 8 am and 4 pm. 60  tablet 0   terbinafine  (LAMISIL ) 250 MG tablet Take 1 tablet (250 mg total) by mouth daily. 90 tablet 0   traZODone  (DESYREL ) 50 MG tablet Take 1 tablet (50 mg total) by mouth at bedtime as needed for sleep. 30 tablet 0   No current facility-administered medications on file prior to visit.    Past Medical History:  Diagnosis Date   Abnormal LFTs 04/06/2013   Very minor tried to reassure okay to repeat today with hepatitis C screen   Allergic rhinitis 03/26/2007   Autonomic dysfunction 01/11/2015   Bipolar 1 disorder    Hx of psychosis; present since age 26   Chest pain on breathing 03/16/2014   Chronic fatigue 06/21/2017   Chronic interstitial cystitis 11/15/2020   Community acquired pneumonia of right lung 08/17/2020   Complication of anesthesia    agitation   Constipation 05/17/2012   Costochondritis 02/14/2014   Diverticular disease of colon 08/06/2020   Double vision 03/15/2011   Dyspnea on effort 01/10/2011   Dysuria 02/15/2013   Early satiety 04/27/2013   Elevated lactic acid level    Endometriosis 05/11/2008   Fatty liver disease, nonalcoholic 02/13/2014   Fecal urgency 08/06/2020   Female stress incontinence 11/15/2020   Food aversion 11/08/2012   Frequency of urination    Gastroesophageal reflux disease 08/06/2020   Headache    History of duodenal ulcer    History of hysterectomy 11/15/2020   History of kidney stones    History of lithium  toxicity 05/01/2014   Hoarseness of voice 02/27/2016    Hyperglycemia 01/10/2011   Hyperlipidemia, mixed 05/12/2017   Hyperprolactinemia 06/26/2015   Insomnia 05/12/2017   Irritable bowel syndrome 08/06/2020   Jaw pain 02/13/2012   poss tmj area     Left upper quadrant pain 08/06/2020   Leukocytosis 05/01/2014   Lump of breast, left 12/20/2012   about 6 oclock    Mass of urethra 11/15/2020   Mild cognitive impairment of uncertain or unknown etiology 10/16/2020   Multinodular goiter 05/12/2017   Nocturia    Nondiabetic gastroparesis 01/11/2015   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 10/10/2021   Palpitations 12/08/2010   Pelvic pain    Periumbilical pain 08/06/2020   Plantar fasciitis 06/11/2007   Postsurgical hypothyroidism 09/03/2021   Pruritus ani 08/06/2020   Right ear pain 02/16/2012   Social anxiety disorder    Subacute bronchitis 07/10/2018   Type II diabetes mellitus 10/10/2021   Unstable gait 07/05/2022   Urgency of urination    Vitamin B12 deficiency 09/03/2021   Vitamin D  deficiency 09/23/2011    Past Surgical History:  Procedure Laterality Date   BREAST BIOPSY Left 12/11/2023   MM LT BREAST BX W LOC DEV EA AD LESION IMG BX SPEC STEREO GUIDE 12/11/2023 [facility]   BREAST BIOPSY Left 12/11/2023   MM LT BREAST BX W LOC DEV 1ST LESION IMAGE BX SPEC STEREO GUIDE 12/11/2023 [facility]   CARDIAC CATHETERIZATION  10-09-1999  DR KATZ   NORMAL LVF/  NORMAL RCA/  NO CRITICAL DISEASE LEFT CORONARY SYSTEM   CARDIOVASCULAR STRESS TEST  01-23-2011   NORMAL NUCLEAR STUDY/ NO ISCHEMIA/ EF 81%   CYSTOSCOPY WITH BIOPSY N/A 06/03/2013   Procedure: CYSTOSCOPY WITH BIOPSY  INSTILLATION OF MARCAINE  AND PYRIDIUM ;  Surgeon: Thomasine Oiler, MD;  Location: [facility];  Service: Urology;  Laterality: N/A;   LAPAROSCOPIC CHOLECYSTECTOMY  11-08-2001   LAPAROSCOPIC LEFT SALPINGOOPHORECTOMY AND LYSYS ADHESIONS  03-10-2002   LAPAROSCOPIC REMOVAL [place]  [place]NT  2003   [place]   LAPAROSCOPY N/A 12/14/2012   Procedure: LAPAROSCOPY DIAGNOSTIC;  Surgeon: Jina Nephew, MD;  Location: [facility];  Service: General;  Laterality: N/A;   THYROIDECTOMY N/A 05/24/2021   Procedure: TOTAL THYROIDECTOMY;  Surgeon: Eletha Boas, MD;  Location: [facility];  Service: General;  Laterality: N/A;   TOTAL ABDOMINAL HYSTERECTOMY  2000   W/ RIGHT SALPINGOOPHORECTOMY   TRANSTHORACIC ECHOCARDIOGRAM  10-13-2010   NORMAL LVF/  EF 55-60%    Allergies  Allergen Reactions   Morphine  And Codeine Nausea And Vomiting and Nausea Only   Ambien  [Zolpidem ] Other (See Comments)    Per patient this caused her to fall   Duloxetine Swelling    Edema   Latuda [Lurasidone Hcl]    Morphine  Nausea Only   Shingrix [Zoster Vac Recomb Adjuvanted]     Dizziness vomiting    Family History  Problem Relation Age of Onset   Other Mother        MAC infection   Anxiety disorder Mother    Dementia Mother        likely Alzheimer's disease; symptom onset on late 70s/early 80s   Sleep apnea Father    Depression Father    Alcohol  abuse Father    Hypertension Father    Migraines Sister        headaches   Anxiety disorder Sister    Depression Sister    Heart disease Paternal Grandmother    Hyperlipidemia Paternal Grandmother    Alcohol  abuse Paternal Grandfather    Diabetes Neg Hx     Social History   Socioeconomic History   Marital status: Married    Spouse name: Not on file   Number of children: 0   Years of education: 16   Highest education level: Bachelor's degree (e.g., BA, AB, BS)  Occupational History   Occupation: Disability    Comment: Nurse  Tobacco Use   Smoking status: Former    Current packs/day: 0.00    Types: Cigarettes    Start date: 1979    Quit date: 1980    Years since quitting: 45.7   Smokeless tobacco: Never   Tobacco comments:    ONLY SMOKED FOR 6 MONTHS --  QUIT  YRS AGO  Vaping Use   Vaping status: Never Used  Substance and Sexual Activity   Alcohol  use: Not  Currently    Comment: occ   Drug use: Never   Sexual activity: Not on file  Other Topics Concern   Not on file  Social History Narrative   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    no tobacco   Caffeine Use: very little, three times a week   Left handed    Social Drivers of Health   Financial Resource Strain: Low Risk  (04/20/2024)   Overall Financial Resource Strain (CARDIA)    Difficulty of Paying Living Expenses: Not hard at all  Food Insecurity: No Food Insecurity (04/20/2024)   Hunger Vital Sign    Worried About Running Out of Food in the Last Year: Never true    Ran Out of Food in the Last Year: Never true  Transportation Needs: No Transportation Needs (04/20/2024)   PRAPARE - Administrator, Civil Service (Medical): No    Lack of Transportation (Non-Medical): No  Physical Activity: Sufficiently Active (04/20/2024)  Exercise Vital Sign    Days of Exercise per Week: 7 days    Minutes of Exercise per Session: 30 min  Recent Concern: Physical Activity - Insufficiently Active (03/14/2024)   Exercise Vital Sign    Days of Exercise per Week: 3 days    Minutes of Exercise per Session: 10 min  Stress: No Stress Concern Present (04/20/2024)   [employer] of Occupational Health - Occupational Stress Questionnaire    Feeling of Stress: Not at all  Social Connections: Moderately Isolated (04/20/2024)   Social Connection and Isolation Panel    Frequency of Communication with Friends and Family: More than three times a week    Frequency of Social Gatherings with Friends and Family: More than three times a week    Attends Religious Services: Never    Database Administrator or Organizations: No    Attends Banker Meetings: Never    Marital Status: Married    Today's Vitals   04/29/24 0839  BP: 108/70  Pulse: 71  Resp: 16  Temp: 97.9  F (36.6 C)  TempSrc: Oral  SpO2: 97%  Weight: 152 lb 3.2 oz (69 kg)  Height: 5' (1.524 m)   Body mass index is 29.72 kg/m.  Wt Readings from Last 3 Encounters:  04/29/24 152 lb 3.2 oz (69 kg)  04/20/24 154 lb (69.9 kg)  04/15/24 154 lb 6.4 oz (70 kg)   Physical Exam Vitals and nursing note reviewed.  Constitutional:      General: She is not in acute distress.    Appearance: She is well-developed.  HENT:     Head: Normocephalic and atraumatic.     Right Ear: Tympanic membrane, ear canal and external ear normal.     Left Ear: Tympanic membrane, ear canal and external ear normal.     Mouth/Throat:     Mouth: Mucous membranes are moist.     Pharynx: Oropharynx is clear. Uvula midline.  Eyes:     Extraocular Movements: Extraocular movements intact.     Conjunctiva/sclera: Conjunctivae normal.     Pupils: Pupils are equal, round, and reactive to light.  Neck:     Thyroid : No thyroid  mass or thyromegaly.  Cardiovascular:     Rate and Rhythm: Normal rate and regular rhythm.     Pulses:          Dorsalis pedis pulses are 2+ on the right side and 2+ on the left side.     Heart sounds: No murmur heard. Pulmonary:     Effort: Pulmonary effort is normal. No respiratory distress.     Breath sounds: Normal breath sounds.  Abdominal:     Palpations: Abdomen is soft. There is no hepatomegaly or mass.     Tenderness: There is no abdominal tenderness.  Genitourinary:    Comments: No concerns. Musculoskeletal:     Comments: No signs of synovitis appreciated.  Lymphadenopathy:     Cervical: No cervical adenopathy.     Upper Body:     Right upper body: No supraclavicular adenopathy.     Left upper body: No supraclavicular adenopathy.  Skin:    General: Skin is warm.     Findings: No erythema or rash.  Neurological:     General: No focal deficit present.     Mental Status: She is alert and oriented to person, place, and time.     Cranial Nerves: No cranial nerve deficit.      Coordination: Coordination normal.  Deep Tendon Reflexes:     Reflex Scores:      Bicep reflexes are 2+ on the right side and 2+ on the left side.      Patellar reflexes are 2+ on the right side and 2+ on the left side.    Comments: Mildly unstable gait, not assisted.  Psychiatric:        Mood and Affect: Mood and affect normal.   ASSESSMENT AND PLAN: Ms. RAVEEN WIESELER was here today annual physical examination.  Orders Placed This Encounter  Procedures   DG Bone Density   Flu vaccine trivalent PF, 6mos and older(Flulaval,Afluria,Fluarix,Fluzone)   Pneumococcal conjugate vaccine 20-valent (Prevnar 20)   Comprehensive metabolic panel with GFR   Lipid panel   VITAMIN D  25 Hydroxy (Vit-D Deficiency, Fractures)   Lab Results  Component Value Date   NA 139 04/29/2024   CL 105 04/29/2024   K 4.0 04/29/2024   CO2 25 04/29/2024   BUN 23 04/29/2024   CREATININE 0.80 04/29/2024   GFR 79.02 04/29/2024   CALCIUM  9.6 04/29/2024   PHOS 2.8 05/01/2014   ALBUMIN 4.1 04/29/2024   GLUCOSE 126 (H) 04/29/2024   Lab Results  Component Value Date   ALT 22 04/29/2024   AST 27 04/29/2024   ALKPHOS 71 04/29/2024   BILITOT 0.9 04/29/2024    Lab Results  Component Value Date   VD25OH 32.70 04/29/2024    Lab Results  Component Value Date   CHOL 143 04/29/2024   HDL 42.60 04/29/2024   LDLCALC 65 04/29/2024   LDLDIRECT 64.0 08/19/2022   TRIG 174.0 (H) 04/29/2024   CHOLHDL 3 04/29/2024   Routine general medical examination at a health care facility We discussed the importance of regular physical activity and healthy diet for prevention of chronic illness and/or complications. Preventive guidelines reviewed. Vaccination updated. Ca++ and vit D supplementation recommended. S/P hysterectomy. Next CPE in a year.  Hyperlipidemia, mixed Assessment & Plan: Continue Pravastatin  20 mg daily and low fat diet. Further recommendations according to LP result.  Orders: -      Comprehensive metabolic panel with GFR -     Lipid panel  Urinary frequency Associated urinary stress incontinence and cystocele. Myrbetriq  has helped, so continue same management. -     Mirabegron  ER; Take 1 tablet (25 mg total) by mouth daily.  Dispense: 90 tablet; Refill: 2  Localized osteoporosis without current pathological fracture She is not on pharmacologic treatment. We discussed a few side effects of alendronate.  Last DEXA in 2022, so due for one, order placed. Calcium  and vit recommended, fall precautions,and regular physical activity as tolerated.  -     DG Bone Density; Future -     VITAMIN D  25 Hydroxy (Vit-D Deficiency, Fractures); Future  Influenza vaccine needed -     Flu vaccine trivalent PF, 6mos and older(Flulaval,Afluria,Fluarix,Fluzone)  Need for pneumococcal vaccination -     Pneumococcal conjugate vaccine 20-valent  Vitamin D  deficiency, unspecified Assessment & Plan: Continue current dose of vit D supplementation. Further recommendations will be given according to 25 OH vit D result.  Return in 1 year (on 04/29/2025) for CPE.   Efosa Treichler G. Laurajean Hosek, MD  [facility]. [place] office. "

## 2024-04-29 ENCOUNTER — Encounter: Payer: Self-pay | Admitting: Family Medicine

## 2024-04-29 ENCOUNTER — Ambulatory Visit: Payer: Self-pay | Admitting: Family Medicine

## 2024-04-29 ENCOUNTER — Ambulatory Visit (INDEPENDENT_AMBULATORY_CARE_PROVIDER_SITE_OTHER): Admitting: Family Medicine

## 2024-04-29 VITALS — BP 108/70 | HR 71 | Temp 97.9°F | Resp 16 | Ht 60.0 in | Wt 152.2 lb

## 2024-04-29 DIAGNOSIS — E559 Vitamin D deficiency, unspecified: Secondary | ICD-10-CM

## 2024-04-29 DIAGNOSIS — R350 Frequency of micturition: Secondary | ICD-10-CM

## 2024-04-29 DIAGNOSIS — E782 Mixed hyperlipidemia: Secondary | ICD-10-CM

## 2024-04-29 DIAGNOSIS — Z23 Encounter for immunization: Secondary | ICD-10-CM

## 2024-04-29 DIAGNOSIS — M816 Localized osteoporosis [Lequesne]: Secondary | ICD-10-CM

## 2024-04-29 DIAGNOSIS — Z0000 Encounter for general adult medical examination without abnormal findings: Secondary | ICD-10-CM | POA: Diagnosis not present

## 2024-04-29 LAB — COMPREHENSIVE METABOLIC PANEL WITH GFR
ALT: 22 U/L (ref 0–35)
AST: 27 U/L (ref 0–37)
Albumin: 4.1 g/dL (ref 3.5–5.2)
Alkaline Phosphatase: 71 U/L (ref 39–117)
BUN: 23 mg/dL (ref 6–23)
CO2: 25 meq/L (ref 19–32)
Calcium: 9.6 mg/dL (ref 8.4–10.5)
Chloride: 105 meq/L (ref 96–112)
Creatinine, Ser: 0.8 mg/dL (ref 0.40–1.20)
GFR: 79.02 mL/min (ref >60.00)
Glucose, Bld: 126 mg/dL — ABNORMAL HIGH (ref 70–99)
Potassium: 4 meq/L (ref 3.5–5.1)
Sodium: 139 meq/L (ref 135–145)
Total Bilirubin: 0.9 mg/dL (ref 0.2–1.2)
Total Protein: 7.2 g/dL (ref 6.0–8.3)

## 2024-04-29 LAB — LIPID PANEL
Cholesterol: 143 mg/dL (ref 0–200)
HDL: 42.6 mg/dL (ref >39.00)
LDL Cholesterol: 65 mg/dL (ref 0–99)
NonHDL: 100.13
Total CHOL/HDL Ratio: 3
Triglycerides: 174 mg/dL — ABNORMAL HIGH (ref 0.0–149.0)
VLDL: 34.8 mg/dL (ref 0.0–40.0)

## 2024-04-29 LAB — VITAMIN D 25 HYDROXY (VIT D DEFICIENCY, FRACTURES): VITD: 32.7 ng/mL (ref 30.00–100.00)

## 2024-04-29 MED ORDER — MIRABEGRON ER 25 MG PO TB24: 25.0000 mg | ORAL_TABLET | Freq: Every day | ORAL | 2 refills | Status: AC

## 2024-04-29 NOTE — Assessment & Plan Note (Signed)
 Continue Pravastatin  20 mg daily and low fat diet. Further recommendations according to LP result.

## 2024-04-29 NOTE — Assessment & Plan Note (Signed)
 Continue current dose of vit D supplementation. Further recommendations will be given according to 25 [place] vit D result.

## 2024-04-29 NOTE — Patient Instructions (Addendum)
 A few things to remember from today's visit:  Routine general medical examination at a health care facility  Hyperlipidemia, mixed - Plan: Comprehensive metabolic panel with GFR, Lipid panel  Urinary frequency - Plan: mirabegron  ER (MYRBETRIQ ) 25 MG TB24 tablet  Influenza vaccine needed - Plan: Flu vaccine trivalent PF, 6mos and older(Flulaval,Afluria,Fluarix,Fluzone)  Localized osteoporosis without current pathological fracture - Plan: DG Bone Density, VITAMIN D  25 Hydroxy (Vit-D Deficiency, Fractures)  Do not use My Chart to request refills or for acute issues that need immediate attention. If you send a my chart message, it may take a few days to be addressed, specially if I am not in the office.  Please be sure medication list is accurate. If a new problem present, please set up appointment sooner than planned today.  Health Maintenance, Female Adopting a healthy lifestyle and getting preventive care are important in promoting health and wellness. Ask your health care provider about: The right schedule for you to have regular tests and exams. Things you can do on your own to prevent diseases and keep yourself healthy. What should I know about diet, weight, and exercise? Eat a healthy diet  Eat a diet that includes plenty of vegetables, fruits, low-fat dairy products, and lean protein. Do not eat a lot of foods that are high in solid fats, added sugars, or sodium. Maintain a healthy weight Body mass index (BMI) is used to identify weight problems. It estimates body fat based on height and weight. Your health care provider can help determine your BMI and help you achieve or maintain a healthy weight. Get regular exercise Get regular exercise. This is one of the most important things you can do for your health. Most adults should: Exercise for at least 150 minutes each week. The exercise should increase your heart rate and make you sweat (moderate-intensity exercise). Do  strengthening exercises at least twice a week. This is in addition to the moderate-intensity exercise. Spend less time sitting. Even light physical activity can be beneficial. Watch cholesterol and blood lipids Have your blood tested for lipids and cholesterol at 62 years of age, then have this test every 5 years. Have your cholesterol levels checked more often if: Your lipid or cholesterol levels are high. You are older than 62 years of age. You are at high risk for heart disease. What should I know about cancer screening? Depending on your health history and family history, you may need to have cancer screening at various ages. This may include screening for: Breast cancer. Cervical cancer. Colorectal cancer. Skin cancer. Lung cancer. What should I know about heart disease, diabetes, and high blood pressure? Blood pressure and heart disease High blood pressure causes heart disease and increases the risk of stroke. This is more likely to develop in people who have high blood pressure readings or are overweight. Have your blood pressure checked: Every 3-5 years if you are 33-36 years of age. Every year if you are 62 years old or older. Diabetes Have regular diabetes screenings. This checks your fasting blood sugar level. Have the screening done: Once every three years after age 54 if you are at a normal weight and have a low risk for diabetes. More often and at a younger age if you are overweight or have a high risk for diabetes. What should I know about preventing infection? Hepatitis B If you have a higher risk for hepatitis B, you should be screened for this virus. Talk with your health care provider to find  out if you are at risk for hepatitis B infection. Hepatitis C Testing is recommended for: Everyone born from [birth year] through 1964. Anyone with known risk factors for hepatitis C. Sexually transmitted infections (STIs) Get screened for STIs, including gonorrhea and chlamydia,  if: You are sexually active and are younger than 62 years of age. You are older than 62 years of age and your health care provider tells you that you are at risk for this type of infection. Your sexual activity has changed since you were last screened, and you are at increased risk for chlamydia or gonorrhea. Ask your health care provider if you are at risk. Ask your health care provider about whether you are at high risk for HIV. Your health care provider may recommend a prescription medicine to help prevent HIV infection. If you choose to take medicine to prevent HIV, you should first get tested for HIV. You should then be tested every 3 months for as long as you are taking the medicine. Pregnancy If you are about to stop having your period (premenopausal) and you may become pregnant, seek counseling before you get pregnant. Take 400 to 800 micrograms (mcg) of folic acid every day if you become pregnant. Ask for birth control (contraception) if you want to prevent pregnancy. Osteoporosis and menopause Osteoporosis is a disease in which the bones lose minerals and strength with aging. This can result in bone fractures. If you are 59 years old or older, or if you are at risk for osteoporosis and fractures, ask your health care provider if you should: Be screened for bone loss. Take a calcium  or vitamin D  supplement to lower your risk of fractures. Be given hormone replacement therapy (HRT) to treat symptoms of menopause. Follow these instructions at home: Alcohol  use Do not drink alcohol  if: Your health care provider tells you not to drink. You are pregnant, may be pregnant, or are planning to become pregnant. If you drink alcohol : Limit how much you have to: 0-1 drink a day. Know how much alcohol  is in your drink. In the [place] one drink equals one 12 oz bottle of beer (355 mL), one 5 oz glass of wine (148 mL), or one 1 oz glass of hard liquor (44 mL). Lifestyle Do not use any products that  contain nicotine or tobacco. These products include cigarettes, chewing tobacco, and vaping devices, such as e-cigarettes. If you need help quitting, ask your health care provider. Do not use street drugs. Do not share needles. Ask your health care provider for help if you need support or information about quitting drugs. General instructions Schedule regular health, dental, and eye exams. Stay current with your vaccines. Tell your health care provider if: You often feel depressed. You have ever been abused or do not feel safe at home. Summary Adopting a healthy lifestyle and getting preventive care are important in promoting health and wellness. Follow your health care provider's instructions about healthy diet, exercising, and getting tested or screened for diseases. Follow your health care provider's instructions on monitoring your cholesterol and blood pressure. This information is not intended to replace advice given to you by your health care provider. Make sure you discuss any questions you have with your health care provider. Document Revised: 12/24/2020 Document Reviewed: 12/24/2020 Elsevier Patient Education  2024 [employer].

## 2024-05-02 ENCOUNTER — Encounter: Payer: Self-pay | Admitting: Family Medicine

## 2024-05-02 ENCOUNTER — Other Ambulatory Visit: Payer: Self-pay | Admitting: Internal Medicine

## 2024-05-02 DIAGNOSIS — E1169 Type 2 diabetes mellitus with other specified complication: Secondary | ICD-10-CM

## 2024-05-17 ENCOUNTER — Other Ambulatory Visit: Payer: Self-pay | Admitting: Internal Medicine

## 2024-05-17 DIAGNOSIS — E1169 Type 2 diabetes mellitus with other specified complication: Secondary | ICD-10-CM

## 2024-05-23 ENCOUNTER — Ambulatory Visit: Admitting: Family Medicine

## 2024-05-23 ENCOUNTER — Encounter: Payer: Self-pay | Admitting: Family Medicine

## 2024-05-23 VITALS — BP 120/80 | HR 76 | Temp 97.9°F | Resp 16 | Ht 60.0 in | Wt 156.4 lb

## 2024-05-23 DIAGNOSIS — M816 Localized osteoporosis [Lequesne]: Secondary | ICD-10-CM | POA: Diagnosis not present

## 2024-05-23 DIAGNOSIS — M79671 Pain in right foot: Secondary | ICD-10-CM | POA: Diagnosis not present

## 2024-05-23 MED ORDER — DICLOFENAC SODIUM 1 % EX GEL: 2.0000 g | Freq: Four times a day (QID) | CUTANEOUS | 1 refills | Status: AC

## 2024-05-23 NOTE — Patient Instructions (Addendum)
 A few things to remember from today's visit:  Foot pain, right - Plan: diclofenac  Sodium (VOLTAREN ) 1 % GEL  Localized osteoporosis without current pathological fracture  Voltaren  gel up to 4 times per day on affected area. Follow with podiatrist is persistent.  Call to find out about status of DEXA. Fosamax will be recommended if osteoporosis. Continue adequate vit D and calcium  intake. Fall precautions.  If you need refills for medications you take chronically, please call your pharmacy. Do not use My Chart to request refills or for acute issues that need immediate attention. If you send a my chart message, it may take a few days to be addressed, specially if I am not in the office.  Please be sure medication list is accurate. If a new problem present, please set up appointment sooner than planned today.

## 2024-05-23 NOTE — Progress Notes (Unsigned)
 ACUTE VISIT Chief Complaint  Patient presents with   Acute Visit    Right ankle pain    HPI: [name] is a 63 y.o. female with PMHx significant for DM II, post surgical hypothyroidism, autonomic dysfunction, IBS,and bipolar disorder *** HPI Discussed the use of AI scribe software for clinical note transcription with the patient, who gave verbal consent to proceed.  History of Present Illness [name] is a 63 year old female with osteopenia who presents with right ankle and foot pain.  She has been experiencing sharp pain in her right foot, specifically in the front and proximal dorsum, for the past few weeks. The pain is rated as a 5 out of 10 when standing and is absent at rest. There is no swelling, redness, numbness, tingling, or burning sensations. She has not taken any medication for the pain.  She denies any recent unusual activity or trauma that could have caused the pain. She has been compensating for the pain by using her back muscles to avoid putting full weight on her right leg.  She has a history of osteopenia and recalls a previous bone density test indicating this condition. Her mother had severe osteoporosis and experienced a heel fracture from a minor fall. A bone density test was ordered on September 12th, but the results are not yet available.  She experiences occasional dizziness and imbalance while walking, which she attributes to possibly trying to catch herself from falling. She has not changed her footwear recently and denies any new shoes.   Review of Systems See other pertinent positives and negatives in HPI.  Current Outpatient Medications on File Prior to Visit  Medication Sig Dispense Refill   aspirin  (ASPIRIN  CHILDRENS) 81 MG chewable tablet Chew 1 tablet (81 mg total) by mouth daily. 30 tablet 0   buPROPion  (WELLBUTRIN  XL) 150 MG 24 hr tablet Take 1 tablet (150 mg total) by mouth daily. 30 tablet 0   divalproex  (DEPAKOTE   ER) 250 MG 24 hr tablet Take 3 tablets (750 mg total) by mouth at bedtime. 30 tablet 0   FLUoxetine  (PROZAC ) 20 MG capsule Take 1 capsule (20 mg total) by mouth daily. 30 capsule 0   hydrOXYzine  (ATARAX ) 25 MG tablet Take 1 tablet (25 mg total) by mouth 3 (three) times daily as needed for anxiety. 30 tablet 0   levothyroxine  (SYNTHROID ) 100 MCG tablet Take 1 tablet by mouth once daily 90 tablet 0   meclizine  (ANTIVERT ) 25 MG tablet Take 1 tablet (25 mg total) by mouth 3 (three) times daily as needed for dizziness. 30 tablet 0   mirabegron  ER (MYRBETRIQ ) 25 MG TB24 tablet Take 1 tablet (25 mg total) by mouth daily. 90 tablet 2   Multiple Vitamin (MULTIVITAMIN WITH MINERALS) TABS tablet Take 1 tablet by mouth daily.     omeprazole  (PRILOSEC) 40 MG capsule Take 1 capsule (40 mg total) by mouth daily. 60 capsule 0   pravastatin  (PRAVACHOL ) 20 MG tablet Take 1 tablet by mouth once daily 90 tablet 2   risperiDONE  (RISPERDAL ) 0.25 MG tablet Take 1 tablet (0.25 mg total) by mouth 2 (two) times daily at 8 am and 4 pm. 60 tablet 0   terbinafine  (LAMISIL ) 250 MG tablet Take 1 tablet (250 mg total) by mouth daily. 90 tablet 0   traZODone  (DESYREL ) 50 MG tablet Take 1 tablet (50 mg total) by mouth at bedtime as needed for sleep. 30 tablet 0   No current facility-administered medications  on file prior to visit.    Past Medical History:  Diagnosis Date   Abnormal LFTs 04/06/2013   Very minor tried to reassure okay to repeat today with hepatitis C screen   Allergic rhinitis 03/26/2007   Autonomic dysfunction 01/11/2015   Bipolar 1 disorder    Hx of psychosis; present since age 13   Chest pain on breathing 03/16/2014   Chronic fatigue 06/21/2017   Chronic interstitial cystitis 11/15/2020   Community acquired pneumonia of right lung 08/17/2020   Complication of anesthesia    agitation   Constipation 05/17/2012   Costochondritis 02/14/2014   Diverticular disease of colon 08/06/2020   Double vision  03/15/2011   Dyspnea on effort 01/10/2011   Dysuria 02/15/2013   Early satiety 04/27/2013   Elevated lactic acid level    Endometriosis 05/11/2008   Fatty liver disease, nonalcoholic 02/13/2014   Fecal urgency 08/06/2020   Female stress incontinence 11/15/2020   Food aversion 11/08/2012   Frequency of urination    Gastroesophageal reflux disease 08/06/2020   Headache    History of duodenal ulcer    History of hysterectomy 11/15/2020   History of kidney stones    History of lithium  toxicity 05/01/2014   Hoarseness of voice 02/27/2016   Hyperglycemia 01/10/2011   Hyperlipidemia, mixed 05/12/2017   Hyperprolactinemia 06/26/2015   Insomnia 05/12/2017   Irritable bowel syndrome 08/06/2020   Jaw pain 02/13/2012   poss tmj area     Left upper quadrant pain 08/06/2020   Leukocytosis 05/01/2014   Lump of breast, left 12/20/2012   about 6 oclock    Mass of urethra 11/15/2020   Mild cognitive impairment of uncertain or unknown etiology 10/16/2020   Multinodular goiter 05/12/2017   Nocturia    Nondiabetic gastroparesis 01/11/2015   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 10/10/2021   Palpitations 12/08/2010   Pelvic pain    Periumbilical pain 08/06/2020   Plantar fasciitis 06/11/2007   Postsurgical hypothyroidism 09/03/2021   Pruritus ani 08/06/2020   Right ear pain 02/16/2012   Social anxiety disorder    Subacute bronchitis 07/10/2018   Type II diabetes mellitus 10/10/2021   Unstable gait 07/05/2022   Urgency of urination    Vitamin B12 deficiency 09/03/2021   Vitamin D  deficiency 09/23/2011   Allergies  Allergen Reactions   Morphine  And Codeine Nausea And Vomiting and Nausea Only   Ambien  [Zolpidem ] Other (See Comments)    Per patient this caused her to fall   Duloxetine Swelling    Edema   Latuda [Lurasidone Hcl]    Morphine  Nausea Only   Shingrix [Zoster Vac Recomb Adjuvanted]     Dizziness vomiting    Social History    Socioeconomic History   Marital status: Married    Spouse name: Not on file   Number of children: 0   Years of education: 16   Highest education level: Bachelor's degree (e.g., BA, AB, BS)  Occupational History   Occupation: Disability    Comment: Nurse  Tobacco Use   Smoking status: Former    Current packs/day: 0.00    Types: Cigarettes    Start date: 1979    Quit date: 1980    Years since quitting: 45.7   Smokeless tobacco: Never   Tobacco comments:    ONLY SMOKED FOR 6 MONTHS --  QUIT  YRS AGO  Vaping Use   Vaping status: Never Used  Substance and Sexual Activity   Alcohol  use: Not Currently  Comment: occ   Drug use: Never   Sexual activity: Not on file  Other Topics Concern   Not on file  Social History Narrative   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    no tobacco   Caffeine Use: very little, three times a week   Left handed    Social Drivers of Health   Financial Resource Strain: Low Risk  (04/20/2024)   Overall Financial Resource Strain (CARDIA)    Difficulty of Paying Living Expenses: Not hard at all  Food Insecurity: No Food Insecurity (04/20/2024)   Hunger Vital Sign    Worried About Running Out of Food in the Last Year: Never true    Ran Out of Food in the Last Year: Never true  Transportation Needs: No Transportation Needs (04/20/2024)   PRAPARE - Administrator, Civil Service (Medical): No    Lack of Transportation (Non-Medical): No  Physical Activity: Sufficiently Active (04/20/2024)   Exercise Vital Sign    Days of Exercise per Week: 7 days    Minutes of Exercise per Session: 30 min  Recent Concern: Physical Activity - Insufficiently Active (03/14/2024)   Exercise Vital Sign    Days of Exercise per Week: 3 days    Minutes of Exercise per Session: 10 min  Stress: No Stress Concern Present (04/20/2024)   [employer] of Occupational Health - Occupational Stress Questionnaire    Feeling of Stress: Not at all  Social Connections: Moderately Isolated (04/20/2024)   Social Connection and Isolation Panel    Frequency of Communication with Friends and Family: More than three times a week    Frequency of Social Gatherings with Friends and Family: More than three times a week    Attends Religious Services: Never    Database administrator or Organizations: No    Attends Banker Meetings: Never    Marital Status: Married    Vitals:   05/23/24 1300  BP: 120/80  Pulse: 76  Resp: 16  Temp: 97.9 F (36.6 C)  SpO2: 98%   Body mass index is 30.54 kg/m.  Physical Exam  ASSESSMENT AND PLAN: There are no diagnoses linked to this encounter.  No follow-ups on file.  Gavynn Duvall G. [place], MD  [facility]. [place] office.  Discharge Instructions   None

## 2024-06-08 DIAGNOSIS — F29 Unspecified psychosis not due to a substance or known physiological condition: Secondary | ICD-10-CM | POA: Diagnosis not present

## 2024-06-13 ENCOUNTER — Ambulatory Visit: Admitting: Podiatry

## 2024-06-24 ENCOUNTER — Telehealth: Payer: Self-pay

## 2024-06-24 DIAGNOSIS — E1169 Type 2 diabetes mellitus with other specified complication: Secondary | ICD-10-CM

## 2024-06-24 NOTE — Progress Notes (Signed)
   06/24/2024  Patient ID: [name], female   DOB: [birth year], 63 y.o.   MRN: [id number]  Attempted to contact patient for schedule a lab visit to collect a uACR for kidney evaluation given diabetes. Left HIPAA compliant message for patient to return my call at their convenience.   Jon VEAR [place], PharmD Clinical Pharmacist [phone]

## 2024-07-21 ENCOUNTER — Emergency Department (HOSPITAL_BASED_OUTPATIENT_CLINIC_OR_DEPARTMENT_OTHER)
Admission: EM | Admit: 2024-07-21 | Discharge: 2024-07-21 | Disposition: A | Discharge status: Home / Self Care | Source: Ambulatory Visit | Attending: Emergency Medicine | Admitting: Emergency Medicine

## 2024-07-21 ENCOUNTER — Encounter (HOSPITAL_BASED_OUTPATIENT_CLINIC_OR_DEPARTMENT_OTHER): Payer: Self-pay | Admitting: Emergency Medicine

## 2024-07-21 ENCOUNTER — Other Ambulatory Visit: Payer: Self-pay

## 2024-07-21 DIAGNOSIS — K219 Gastro-esophageal reflux disease without esophagitis: Secondary | ICD-10-CM | POA: Diagnosis not present

## 2024-07-21 DIAGNOSIS — K5730 Diverticulosis of large intestine without perforation or abscess without bleeding: Secondary | ICD-10-CM | POA: Diagnosis not present

## 2024-07-21 DIAGNOSIS — R197 Diarrhea, unspecified: Secondary | ICD-10-CM | POA: Diagnosis not present

## 2024-07-21 DIAGNOSIS — K529 Noninfective gastroenteritis and colitis, unspecified: Secondary | ICD-10-CM | POA: Diagnosis not present

## 2024-07-21 DIAGNOSIS — Z7982 Long term (current) use of aspirin: Secondary | ICD-10-CM | POA: Insufficient documentation

## 2024-07-21 DIAGNOSIS — K449 Diaphragmatic hernia without obstruction or gangrene: Secondary | ICD-10-CM | POA: Diagnosis not present

## 2024-07-21 DIAGNOSIS — R159 Full incontinence of feces: Secondary | ICD-10-CM | POA: Diagnosis not present

## 2024-07-21 DIAGNOSIS — R152 Fecal urgency: Secondary | ICD-10-CM | POA: Diagnosis not present

## 2024-07-21 DIAGNOSIS — K9089 Other intestinal malabsorption: Secondary | ICD-10-CM | POA: Diagnosis not present

## 2024-07-21 DIAGNOSIS — K760 Fatty (change of) liver, not elsewhere classified: Secondary | ICD-10-CM | POA: Diagnosis not present

## 2024-07-21 LAB — CBC
HCT: 39.8 % (ref 36.0–46.0)
Hemoglobin: 13.6 g/dL (ref 12.0–15.0)
MCH: 31.9 pg (ref 26.0–34.0)
MCHC: 34.2 g/dL (ref 30.0–36.0)
MCV: 93.4 fL (ref 80.0–100.0)
Platelets: 222 K/uL (ref 150–400)
RBC: 4.26 MIL/uL (ref 3.87–5.11)
RDW: 12.4 % (ref 11.5–15.5)
WBC: 6.7 K/uL (ref 4.0–10.5)
nRBC: 0 % (ref 0.0–0.2)

## 2024-07-21 LAB — COMPREHENSIVE METABOLIC PANEL WITH GFR
ALT: 44 U/L (ref 0–44)
AST: 64 U/L — ABNORMAL HIGH (ref 15–41)
Albumin: 4.1 g/dL (ref 3.5–5.0)
Alkaline Phosphatase: 90 U/L (ref 38–126)
Anion gap: 12 (ref 5–15)
BUN: 13 mg/dL (ref 8–23)
CO2: 23 mmol/L (ref 22–32)
Calcium: 9.2 mg/dL (ref 8.9–10.3)
Chloride: 105 mmol/L (ref 98–111)
Creatinine, Ser: 0.76 mg/dL (ref 0.44–1.00)
GFR, Estimated: >60 mL/min (ref >60)
Glucose, Bld: 91 mg/dL (ref 70–99)
Potassium: 4.1 mmol/L (ref 3.5–5.1)
Sodium: 139 mmol/L (ref 135–145)
Total Bilirubin: 0.7 mg/dL (ref 0.0–1.2)
Total Protein: 7.4 g/dL (ref 6.5–8.1)

## 2024-07-21 LAB — URINALYSIS, ROUTINE W REFLEX MICROSCOPIC
Bilirubin Urine: NEGATIVE
Glucose, UA: NEGATIVE mg/dL
Hgb urine dipstick: NEGATIVE
Ketones, ur: NEGATIVE mg/dL
Leukocytes,Ua: NEGATIVE
Nitrite: NEGATIVE
Protein, ur: NEGATIVE mg/dL
Specific Gravity, Urine: 1.03 (ref 1.005–1.030)
pH: 6 (ref 5.0–8.0)

## 2024-07-21 LAB — MAGNESIUM: Magnesium: 2 mg/dL (ref 1.7–2.4)

## 2024-07-21 LAB — LIPASE, BLOOD: Lipase: 19 U/L (ref 11–51)

## 2024-07-21 MED ORDER — SODIUM CHLORIDE 0.9 % IV BOLUS
1000.0000 mL | Freq: Once | INTRAVENOUS | Status: AC
  Administered 2024-07-21: 1000 mL via INTRAVENOUS

## 2024-07-21 NOTE — ED Notes (Signed)
 Pt denies any dizziness during orthos, notes more of a weakness and mentions no PO intake recently, requesting saltines for her hunger

## 2024-07-21 NOTE — ED Triage Notes (Signed)
 Diarrhea x 1 year, got worse past 2-3 months . Was at GI doctor, hypotensive . Lethargic .

## 2024-07-21 NOTE — ED Provider Notes (Signed)
 [facility] Provider Note   [name]: [id number] Arrival date & time: 07/21/24  1734     Patient presents with: Diarrhea   [name] is a 63 y.o. female.   Patient with history of remote cholecystectomy, chronic diarrhea treated at home with Imodium --presents to the emergency department today for evaluation of hypotension.  Patient states that she has had chronic diarrhea, greater than 1 year.  She feels that her symptoms have been worse over the past several months.  This prompted her to follow-up with GI.  Friend at bedside states that blood pressures were in the 90s over 30s at the office, and that her GI Dr. Kristie wanted her to come to the emergency department to be evaluated.  No fevers.  Patient has generalized abdominal discomfort.  No lightheadedness or syncope.  No blood noted in the stool.  She has had colonoscopy, reported negative except for diverticulosis.  Patient feels generally weak.  Denies recent antibiotic use.  No recent travel.  Patient states that she was called in a new powder to try for diarrhea.       Prior to Admission medications   Medication Sig Start Date End Date Taking? Authorizing Provider  aspirin  (ASPIRIN  CHILDRENS) 81 MG chewable tablet Chew 1 tablet (81 mg total) by mouth daily. 07/02/23   Victoria Ruts, MD  buPROPion  (WELLBUTRIN  XL) 150 MG 24 hr tablet Take 1 tablet (150 mg total) by mouth daily. 07/03/23   Victoria Ruts, MD  diclofenac  Sodium (VOLTAREN ) 1 % GEL Apply 2 g topically 4 (four) times daily. 05/23/24   Jordan, [name], MD  divalproex  (DEPAKOTE  ER) 250 MG 24 hr tablet Take 3 tablets (750 mg total) by mouth at bedtime. 07/02/23   Victoria Ruts, MD  FLUoxetine  (PROZAC ) 20 MG capsule Take 1 capsule (20 mg total) by mouth daily. 07/03/23   Victoria Ruts, MD  hydrOXYzine  (ATARAX ) 25 MG tablet Take 1 tablet (25 mg total) by mouth 3 (three) times daily as needed for anxiety.  07/02/23   Victoria Ruts, MD  levothyroxine  (SYNTHROID ) 100 MCG tablet Take 1 tablet by mouth once daily 05/17/24   Shamleffer, Ibtehal Jaralla, MD  meclizine  (ANTIVERT ) 25 MG tablet Take 1 tablet (25 mg total) by mouth 3 (three) times daily as needed for dizziness. 04/22/22   Emil Share, DO  mirabegron  ER (MYRBETRIQ ) 25 MG TB24 tablet Take 1 tablet (25 mg total) by mouth daily. 04/29/24   Jordan, [name], MD  Multiple Vitamin (MULTIVITAMIN WITH MINERALS) TABS tablet Take 1 tablet by mouth daily.    [provider]  omeprazole  (PRILOSEC) 40 MG capsule Take 1 capsule (40 mg total) by mouth daily. 05/22/23 05/23/24  Jordan, [name], MD  pravastatin  (PRAVACHOL ) 20 MG tablet Take 1 tablet by mouth once daily 11/16/23   Jordan, [name], MD  risperiDONE  (RISPERDAL ) 0.25 MG tablet Take 1 tablet (0.25 mg total) by mouth 2 (two) times daily at 8 am and 4 pm. 07/02/23   Victoria Ruts, MD  terbinafine  (LAMISIL ) 250 MG tablet Take 1 tablet (250 mg total) by mouth daily. 12/09/23   Janit Thresa HERO, DPM  traZODone  (DESYREL ) 50 MG tablet Take 1 tablet (50 mg total) by mouth at bedtime as needed for sleep. 07/02/23   Victoria Ruts, MD    Allergies: Morphine  and codeine, Ambien  [zolpidem ], Duloxetine, Latuda [lurasidone hcl], Morphine , and Shingrix [zoster vac recomb adjuvanted]    Review of Systems  Updated Vital  Signs BP 120/84 (BP Location: Left Arm)   Pulse 80   Temp 98.3 F (36.8 C) (Oral)   Resp 18   Wt 69.9 kg   SpO2 96%   BMI 30.08 kg/m   Physical Exam Vitals and nursing note reviewed.  Constitutional:      General: She is not in acute distress.    Appearance: She is well-developed.  HENT:     Head: Normocephalic and atraumatic.     Right Ear: External ear normal.     Left Ear: External ear normal.     Nose: Nose normal.  Eyes:     Conjunctiva/sclera: Conjunctivae normal.  Cardiovascular:     Rate and Rhythm: Normal rate and regular rhythm.     Heart sounds: No murmur  heard. Pulmonary:     Effort: No respiratory distress.     Breath sounds: No wheezing, rhonchi or rales.  Abdominal:     Palpations: Abdomen is soft.     Tenderness: There is abdominal tenderness. There is no guarding or rebound.     Comments: Generalized abdominal tenderness to palpation, no focality, no rebound or guarding.  Musculoskeletal:     Cervical back: Normal range of motion and neck supple.     Right lower leg: No edema.     Left lower leg: No edema.  Skin:    General: Skin is warm and dry.     Findings: No rash.  Neurological:     General: No focal deficit present.     Mental Status: She is alert. Mental status is at baseline.     Motor: No weakness.  Psychiatric:        Mood and Affect: Mood normal.     (all labs ordered are listed, but only abnormal results are displayed) Labs Reviewed  COMPREHENSIVE METABOLIC PANEL WITH GFR - Abnormal; Notable for the following components:      Result Value   AST 64 (*)    All other components within normal limits  LIPASE, BLOOD  CBC  URINALYSIS, ROUTINE W REFLEX MICROSCOPIC  MAGNESIUM     EKG: None  Radiology: No results found.   Procedures   Medications Ordered in the ED - No data to display  ED Course  Patient seen and examined. History obtained directly from patient. Work-up including labs, imaging, EKG ordered in triage, if performed, were reviewed.    Labs/EKG: Independently reviewed and interpreted.  This included: CBC normal white blood cell count and hemoglobin; CMP unremarkable except for slightly elevated AST; lipase normal.  I did do magnesium .  Awaiting UA.  Imaging: None ordered.  Medications/Fluids: Ordered: IV fluid bolus.  Most recent vital signs reviewed and are as follows: BP 120/84 (BP Location: Left Arm)   Pulse 80   Temp 98.3 F (36.8 C) (Oral)   Resp 18   Wt 69.9 kg   SpO2 96%   BMI 30.08 kg/m   Initial impression: Chronic diarrhea, reported hypotension today, generalized  weakness.  Electrolytes are normal.  Will hydrate.  Patient appears well, nontoxic.  Reassuring abdominal exam, nonfocal.  9:42 PM Reassessment performed. Patient appears stable.  Normal orthostatics after IV fluids.  Labs personally reviewed and interpreted including: Magnesium  was normal.  UA without signs of infection.  Reviewed pertinent lab work and imaging with patient at bedside. Questions answered.   Most current vital signs reviewed and are as follows: BP 121/76   Pulse 72   Temp 98.3 F (36.8 C) (Oral)   Resp 18  Wt 69.9 kg   SpO2 100%   BMI 30.08 kg/m   Orthostatic VS for the past 24 hrs:  BP- Lying Pulse- Lying BP- Sitting Pulse- Sitting BP- Standing at 0 minutes Pulse- Standing at 0 minutes  07/21/24 2106 114/65 72 123/81 78 127/83 74    Plan: Discharge to home.  New medication prescribed by GI for diarrhea which she is going to try.  Prescriptions written for: None  Other home care instructions discussed: Continue hydration at home.    ED return instructions discussed: The patient was urged to return to the Emergency Department immediately with worsening of current symptoms, worsening abdominal pain, persistent vomiting, blood noted in stools, fever, or any other concerns. The patient verbalized understanding.   Follow-up instructions discussed: Patient encouraged to follow-up with their PCP as planned.                                    Medical Decision Making Amount and/or Complexity of Data Reviewed Labs: ordered.   Patient with chronic diarrhea, worsening over the period of the past few months.  Reported hypotension at GI today.  Patient was hydrated.  Normal orthostatics here.  She looks well.  Lab workup is very reassuring with normal electrolytes, no elevated kidney function.  Nonfocal abdominal exam.  Given clinical improvement, comfortable with discharge.  Patient looks well, no concern for sepsis.  Doubt colitis, diverticulitis, or other acute GI  infection.  The patient's vital signs, pertinent lab work and imaging were reviewed and interpreted as discussed in the ED course. Hospitalization was considered for further testing, treatments, or serial exams/observation. However as patient is well-appearing, has a stable exam, and reassuring studies today, I do not feel that they warrant admission at this time. This plan was discussed with the patient who verbalizes agreement and comfort with this plan and seems reliable and able to return to the Emergency Department with worsening or changing symptoms.       Final diagnoses:  Chronic diarrhea    ED Discharge Orders     None          Desiderio Chew, PA-C 07/21/24 2144    Trifan, Matthew J, MD 07/21/24 2207

## 2024-07-21 NOTE — Discharge Instructions (Signed)
 Please read and follow all provided instructions.  Your diagnoses today include:  1. Chronic diarrhea     Tests performed today include: Complete blood cell count: Normal white and red blood cells Complete metabolic panel: Slightly high liver function test but otherwise normal Magnesium : Was normal Urinalysis (urine test): No signs of urinary tract infection Pregnancy test (urine or blood, in women only): Vital signs. See below for your results today.   Medications prescribed:  None  Take any prescribed medications only as directed.  Home care instructions:  Follow any educational materials contained in this packet.   Follow-up instructions: Please follow-up with your primary care provider as needed.   Return instructions:  SEEK IMMEDIATE MEDICAL ATTENTION IF: If you have pain that does not go away or becomes severe  A temperature above 101F develops  Repeated vomiting occurs (multiple episodes)  If you have pain that becomes localized to portions of the abdomen. The right side could possibly be appendicitis. In an adult, the left lower portion of the abdomen could be colitis or diverticulitis.  Blood is being passed in stools or vomit (bright red or black tarry stools)  You develop chest pain, difficulty breathing, dizziness or fainting, or become confused, poorly responsive, or inconsolable (young children) If you have any other emergent concerns regarding your health  Additional Information: Abdominal (belly) pain can be caused by many things. Your caregiver performed an examination and possibly ordered blood/urine tests and imaging (CT scan, x-rays, ultrasound). Many cases can be observed and treated at home after initial evaluation in the emergency department. Even though you are being discharged home, abdominal pain can be unpredictable. Therefore, you need a repeated exam if your pain does not resolve, returns, or worsens. Most patients with abdominal pain don't have to be  admitted to the hospital or have surgery, but serious problems like appendicitis and gallbladder attacks can start out as nonspecific pain. Many abdominal conditions cannot be diagnosed in one visit, so follow-up evaluations are very important.  Your vital signs today were: BP 121/76   Pulse 72   Temp 98.3 F (36.8 C) (Oral)   Resp 18   Wt 69.9 kg   SpO2 100%   BMI 30.08 kg/m  If your blood pressure (bp) was elevated above 135/85 this visit, please have this repeated by your doctor within one month. --------------

## 2024-07-21 NOTE — ED Notes (Signed)
 Waiting for IV to finish per Paramedic Megan to do orthostatics

## 2024-08-12 ENCOUNTER — Encounter (HOSPITAL_BASED_OUTPATIENT_CLINIC_OR_DEPARTMENT_OTHER): Payer: Self-pay | Admitting: Emergency Medicine

## 2024-08-12 ENCOUNTER — Emergency Department (HOSPITAL_BASED_OUTPATIENT_CLINIC_OR_DEPARTMENT_OTHER)

## 2024-08-12 ENCOUNTER — Other Ambulatory Visit: Payer: Self-pay

## 2024-08-12 ENCOUNTER — Emergency Department (HOSPITAL_BASED_OUTPATIENT_CLINIC_OR_DEPARTMENT_OTHER)
Admission: EM | Admit: 2024-08-12 | Discharge: 2024-08-12 | Disposition: A | Discharge status: Home / Self Care | Attending: Emergency Medicine | Admitting: Emergency Medicine

## 2024-08-12 ENCOUNTER — Other Ambulatory Visit (HOSPITAL_BASED_OUTPATIENT_CLINIC_OR_DEPARTMENT_OTHER): Payer: Self-pay

## 2024-08-12 DIAGNOSIS — Z79899 Other long term (current) drug therapy: Secondary | ICD-10-CM | POA: Insufficient documentation

## 2024-08-12 DIAGNOSIS — Z7982 Long term (current) use of aspirin: Secondary | ICD-10-CM | POA: Diagnosis not present

## 2024-08-12 DIAGNOSIS — R051 Acute cough: Secondary | ICD-10-CM | POA: Insufficient documentation

## 2024-08-12 LAB — RESP PANEL BY RT-PCR (RSV, FLU A&B, COVID)  RVPGX2
Influenza A by PCR: NEGATIVE
Influenza B by PCR: NEGATIVE
Resp Syncytial Virus by PCR: NEGATIVE
SARS Coronavirus 2 by RT PCR: NEGATIVE

## 2024-08-12 MED ORDER — AZITHROMYCIN 250 MG PO TABS
250.0000 mg | ORAL_TABLET | Freq: Every day | ORAL | 0 refills | Status: DC
  Filled 2024-08-12: qty 6, 6d supply, fill #0

## 2024-08-12 NOTE — ED Provider Notes (Signed)
 "  [facility]  Provider Note  [name]: [id number] Arrival date & time: 08/12/24 0600  History Chief Complaint  Patient presents with   Flu-like symptoms    [name] is a 63 y.o. female here with wife for evaluation of 3-4 days of cough, productive of yellow sputum. History of pneumonia. No reported fevers. No nasal congestion. Denies CP or SOB.    Home Medications Prior to Admission medications  Medication Sig Start Date End Date Taking? Authorizing Provider  aspirin  (ASPIRIN  CHILDRENS) 81 MG chewable tablet Chew 1 tablet (81 mg total) by mouth daily. 07/02/23   Victoria Ruts, MD  buPROPion  (WELLBUTRIN  XL) 150 MG 24 hr tablet Take 1 tablet (150 mg total) by mouth daily. 07/03/23   Victoria Ruts, MD  diclofenac  Sodium (VOLTAREN ) 1 % GEL Apply 2 g topically 4 (four) times daily. 05/23/24   Jordan, [name], MD  divalproex  (DEPAKOTE  ER) 250 MG 24 hr tablet Take 3 tablets (750 mg total) by mouth at bedtime. 07/02/23   Victoria Ruts, MD  FLUoxetine  (PROZAC ) 20 MG capsule Take 1 capsule (20 mg total) by mouth daily. 07/03/23   Victoria Ruts, MD  hydrOXYzine  (ATARAX ) 25 MG tablet Take 1 tablet (25 mg total) by mouth 3 (three) times daily as needed for anxiety. 07/02/23   Victoria Ruts, MD  levothyroxine  (SYNTHROID ) 100 MCG tablet Take 1 tablet by mouth once daily 05/17/24   Shamleffer, Ibtehal Jaralla, MD  meclizine  (ANTIVERT ) 25 MG tablet Take 1 tablet (25 mg total) by mouth 3 (three) times daily as needed for dizziness. 04/22/22   Emil Share, DO  mirabegron  ER (MYRBETRIQ ) 25 MG TB24 tablet Take 1 tablet (25 mg total) by mouth daily. 04/29/24   Jordan, [name], MD  Multiple Vitamin (MULTIVITAMIN WITH MINERALS) TABS tablet Take 1 tablet by mouth daily.    [provider]  omeprazole  (PRILOSEC) 40 MG capsule Take 1 capsule (40 mg total) by mouth daily. 05/22/23 05/23/24  Jordan, [name], MD  pravastatin  (PRAVACHOL ) 20 MG  tablet Take 1 tablet by mouth once daily 11/16/23   Jordan, [name], MD  risperiDONE  (RISPERDAL ) 0.25 MG tablet Take 1 tablet (0.25 mg total) by mouth 2 (two) times daily at 8 am and 4 pm. 07/02/23   Victoria Ruts, MD  terbinafine  (LAMISIL ) 250 MG tablet Take 1 tablet (250 mg total) by mouth daily. 12/09/23   Janit Thresa HERO, DPM  traZODone  (DESYREL ) 50 MG tablet Take 1 tablet (50 mg total) by mouth at bedtime as needed for sleep. 07/02/23   Victoria Ruts, MD     Allergies    Morphine  and codeine, Ambien  [zolpidem ], Duloxetine, Latuda [lurasidone hcl], Morphine , and Shingrix [zoster vac recomb adjuvanted]   Review of Systems   Review of Systems Please see HPI for pertinent positives and negatives  Physical Exam BP 124/78   Pulse 100   Temp 99.8 F (37.7 C) (Oral)   Resp 20   Ht 5' (1.524 m)   Wt 70.3 kg   SpO2 97%   BMI 30.27 kg/m   Physical Exam Vitals and nursing note reviewed.  Constitutional:      Appearance: Normal appearance.  HENT:     Head: Normocephalic and atraumatic.     Nose: Nose normal.     Mouth/Throat:     Mouth: Mucous membranes are moist.  Eyes:     Extraocular Movements: Extraocular movements intact.     Conjunctiva/sclera: Conjunctivae normal.  Cardiovascular:  Rate and Rhythm: Normal rate.  Pulmonary:     Effort: Pulmonary effort is normal.     Breath sounds: Rhonchi present. No wheezing or rales.  Abdominal:     General: Abdomen is flat.     Palpations: Abdomen is soft.     Tenderness: There is no abdominal tenderness.  Musculoskeletal:        General: No swelling. Normal range of motion.     Cervical back: Neck supple.  Skin:    General: Skin is warm and dry.  Neurological:     General: No focal deficit present.     Mental Status: She is alert.  Psychiatric:        Mood and Affect: Mood normal.     ED Results / Procedures / Treatments   EKG None  Procedures Procedures  Medications Ordered in the ED Medications - No  data to display  Initial Impression and Plan  Patient here with productive cough for the last few days. Specifically concerned for pneumonia. Overall vitals and exam are reassuring. Will check Covid/Flu/RSV swab and CXR.   ED Course   Clinical Course as of 08/12/24 0655  Fri Aug 12, 2024  0654 Care signed out at shift change to oncoming team [CS]    Clinical Course User Index [CS] Roselyn Carlin NOVAK, MD     MDM Rules/Calculators/A&P Medical Decision Making Problems Addressed: Acute cough: acute illness or injury  Amount and/or Complexity of Data Reviewed Labs: ordered. Radiology: ordered.     Final Clinical Impression(s) / ED Diagnoses Final diagnoses:  Acute cough    Rx / DC Orders ED Discharge Orders     None        Roselyn Carlin NOVAK, MD 08/12/24 [phone]  "

## 2024-08-12 NOTE — ED Triage Notes (Signed)
 Productive cough x 3-4 days. Afebrile. Pt and spouse concerned for CAP. No acute distress.

## 2024-08-12 NOTE — ED Provider Notes (Signed)
 Viral testing negative.  Chest x-ray negative for obvious pneumonia.  Will treat with Z-Pak.  I do suspect still viral process.  Recommend Tylenol  and ibuprofen .  Return if symptoms worsen.  Very well-appearing.   Ruthe Cornet, DO 08/12/24 [phone]

## 2024-08-15 ENCOUNTER — Other Ambulatory Visit: Payer: Self-pay | Admitting: Family Medicine

## 2024-08-15 DIAGNOSIS — E782 Mixed hyperlipidemia: Secondary | ICD-10-CM

## 2024-08-24 ENCOUNTER — Ambulatory Visit: Admitting: Family Medicine

## 2024-08-24 ENCOUNTER — Encounter: Payer: Self-pay | Admitting: Family Medicine

## 2024-08-24 VITALS — BP 122/88 | HR 71 | Temp 99.3°F | Resp 16 | Ht 60.0 in | Wt 155.0 lb

## 2024-08-24 DIAGNOSIS — R059 Cough, unspecified: Secondary | ICD-10-CM | POA: Diagnosis not present

## 2024-08-24 DIAGNOSIS — R062 Wheezing: Secondary | ICD-10-CM

## 2024-08-24 DIAGNOSIS — F250 Schizoaffective disorder, bipolar type: Secondary | ICD-10-CM

## 2024-08-24 DIAGNOSIS — E1169 Type 2 diabetes mellitus with other specified complication: Secondary | ICD-10-CM | POA: Diagnosis not present

## 2024-08-24 MED ORDER — BUDESONIDE-FORMOTEROL FUMARATE 80-4.5 MCG/ACT IN AERO: 2.0000 | INHALATION_SPRAY | Freq: Two times a day (BID) | RESPIRATORY_TRACT | 3 refills | Status: AC

## 2024-08-24 NOTE — Progress Notes (Signed)
 "  Chief Complaint  Patient presents with   Hospitalization Follow-up   Discussed the use of AI scribe software for clinical note transcription with the patient, who gave verbal consent to proceed.  History of Present Illness [name] is a 64 year old female with PMHx significant for DM II, post surgical hypothyroidism, autonomic dysfunction, IBS,and bipolar disorder here today with her wife to follow on recent ED visit.  Evaluated in the ED on 08/12/24 due to a persistent cough. During the ED visit, she underwent testing for flu and COVID-19, blood work, and a chest x-ray. The cough has improved significantly, though she still experiences some congestion still.  She experiences a little bit of wheezing. She was prescribed antibiotics during her ED visit, azithromycin , which caused her mouth to feel raw and burn when eating.  No fever, chills, or chest pain. She reports a little bit of wheezing. In general symptoms have greatly improved. She is currently taking Robitussin for her cough and has been using old albuterol  inh, which helped.  DM II, for which she is following up with her endocrinologist. Lab Results  Component Value Date   HGBA1C 6.4 (A) 03/11/2024   Bipolar disorder: She is seeing a psychiatrist regularly.  Review of Systems  Constitutional:  Positive for fatigue. Negative for appetite change.  HENT:  Positive for postnasal drip. Negative for facial swelling, sinus pain and sore throat.   Eyes:  Negative for discharge and redness.  Gastrointestinal:  Negative for abdominal pain, nausea and vomiting.  Skin:  Negative for rash.  Allergic/Immunologic: Positive for environmental allergies.  Neurological:  Negative for syncope and headaches.  Psychiatric/Behavioral:  Negative for confusion and hallucinations.   See other pertinent positives and negatives in HPI.  Medications Ordered Prior to Encounter[1]  Past Medical History:  Diagnosis Date    Abnormal LFTs 04/06/2013   Very minor tried to reassure okay to repeat today with hepatitis C screen   Allergic rhinitis 03/26/2007   Autonomic dysfunction 01/11/2015   Bipolar 1 disorder    Hx of psychosis; present since age 64   Chest pain on breathing 03/16/2014   Chronic fatigue 06/21/2017   Chronic interstitial cystitis 11/15/2020   Community acquired pneumonia of right lung 08/17/2020   Complication of anesthesia    agitation   Constipation 05/17/2012   Costochondritis 02/14/2014   Diverticular disease of colon 08/06/2020   Double vision 03/15/2011   Dyspnea on effort 01/10/2011   Dysuria 02/15/2013   Early satiety 04/27/2013   Elevated lactic acid level    Endometriosis 05/11/2008   Fatty liver disease, nonalcoholic 02/13/2014   Fecal urgency 08/06/2020   Female stress incontinence 11/15/2020   Food aversion 11/08/2012   Frequency of urination    Gastroesophageal reflux disease 08/06/2020   Headache    History of duodenal ulcer    History of hysterectomy 11/15/2020   History of kidney stones    History of lithium  toxicity 05/01/2014   Hoarseness of voice 02/27/2016   Hyperglycemia 01/10/2011   Hyperlipidemia, mixed 05/12/2017   Hyperprolactinemia 06/26/2015   Insomnia 05/12/2017   Irritable bowel syndrome 08/06/2020   Jaw pain 02/13/2012   poss tmj area     Left upper quadrant pain 08/06/2020   Leukocytosis 05/01/2014   Lump of breast, left 12/20/2012   about 6 oclock    Mass of urethra 11/15/2020   Mild cognitive impairment of uncertain or unknown etiology 10/16/2020   Multinodular goiter 05/12/2017   Nocturia  Nondiabetic gastroparesis 01/11/2015   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 10/10/2021   Palpitations 12/08/2010   Pelvic pain    Periumbilical pain 08/06/2020   Plantar fasciitis 06/11/2007   Postsurgical hypothyroidism 09/03/2021   Pruritus ani 08/06/2020   Right ear pain 02/16/2012   Social anxiety  disorder    Subacute bronchitis 07/10/2018   Type II diabetes mellitus 10/10/2021   Unstable gait 07/05/2022   Urgency of urination    Vitamin B12 deficiency 09/03/2021   Vitamin D  deficiency 09/23/2011   Allergies[2]  Social History   Socioeconomic History   Marital status: Married    Spouse name: Not on file   Number of children: 0   Years of education: 16   Highest education level: Bachelor's degree (e.g., BA, AB, BS)  Occupational History   Occupation: Disability    Comment: Nurse  Tobacco Use   Smoking status: Former    Current packs/day: 0.00    Types: Cigarettes    Start date: 1979    Quit date: 1980    Years since quitting: 46.0   Smokeless tobacco: Never   Tobacco comments:    ONLY SMOKED FOR 6 MONTHS --  QUIT  YRS AGO  Vaping Use   Vaping status: Never Used  Substance and Sexual Activity   Alcohol  use: Not Currently    Comment: occ   Drug use: Never   Sexual activity: Not on file  Other Topics Concern   Not on file  Social History Narrative   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    no tobacco   Caffeine Use: very little, three times a week   Left handed    Social Drivers of Health   Tobacco Use: Medium Risk (08/24/2024)   Patient History    Smoking Tobacco Use: Former    Smokeless Tobacco Use: Never    Passive Exposure: Not on file  Financial Resource Strain: Low Risk (04/20/2024)   Overall Financial Resource Strain (CARDIA)    Difficulty of Paying Living Expenses: Not hard at all  Food Insecurity: No Food Insecurity (04/20/2024)   Epic    Worried About Radiation Protection Practitioner of Food in the Last Year: Never true    Ran Out of Food in the Last Year: Never true  Transportation Needs: No Transportation Needs (04/20/2024)   Epic    Lack of Transportation (Medical): No    Lack of Transportation (Non-Medical): No  Physical Activity:  Sufficiently Active (04/20/2024)   Exercise Vital Sign    Days of Exercise per Week: 7 days    Minutes of Exercise per Session: 30 min  Recent Concern: Physical Activity - Insufficiently Active (03/14/2024)   Exercise Vital Sign    Days of Exercise per Week: 3 days    Minutes of Exercise per Session: 10 min  Stress: No Stress Concern Present (04/20/2024)   [employer] of Occupational Health - Occupational Stress Questionnaire    Feeling of Stress: Not at all  Social Connections: Moderately Isolated (04/20/2024)   Social Connection and Isolation Panel    Frequency of Communication with Friends and Family: More than three times a week    Frequency of Social Gatherings with Friends and Family: More than three times a week    Attends Religious Services: Never    Database Administrator or Organizations: No  Attends Banker Meetings: Never    Marital Status: Married  Depression (PHQ2-9): Low Risk (04/20/2024)   Depression (PHQ2-9)    PHQ-2 Score: 0  Alcohol  Screen: Low Risk (04/20/2024)   Alcohol  Screen    Last Alcohol  Screening Score (AUDIT): 0  Housing: Unknown (04/20/2024)   Epic    Unable to Pay for Housing in the Last Year: No    Number of Times Moved in the Last Year: Not on file    Homeless in the Last Year: No  Utilities: Not At Risk (04/20/2024)   Epic    Threatened with loss of utilities: No  Health Literacy: Adequate Health Literacy (04/20/2024)   B1300 Health Literacy    Frequency of need for help with medical instructions: Never   Vitals:   08/24/24 0925  BP: 122/88  Pulse: 71  Resp: 16  Temp: 99.3 F (37.4 C)  SpO2: 95%   Body mass index is 30.27 kg/m.  Physical Exam Vitals and nursing note reviewed.  Constitutional:      General: She is not in acute distress.    Appearance: She is well-developed. She is not ill-appearing.  HENT:     Head: Normocephalic and atraumatic.     Nose: No congestion or rhinorrhea.     Right Sinus: No maxillary sinus  tenderness or frontal sinus tenderness.     Left Sinus: No maxillary sinus tenderness or frontal sinus tenderness.     Mouth/Throat:     Mouth: Mucous membranes are moist.     Pharynx: Oropharynx is clear.  Eyes:     Conjunctiva/sclera: Conjunctivae normal.  Cardiovascular:     Rate and Rhythm: Normal rate and regular rhythm.     Heart sounds: No murmur heard. Pulmonary:     Effort: Pulmonary effort is normal. No respiratory distress.     Breath sounds: Normal breath sounds. No stridor.  Lymphadenopathy:     Head:     Right side of head: No submandibular adenopathy.     Left side of head: No submandibular adenopathy.     Cervical: No cervical adenopathy.  Skin:    General: Skin is warm.     Findings: No erythema or rash.  Neurological:     General: No focal deficit present.     Mental Status: She is alert and oriented to person, place, and time.     Comments: Mildly unstable, not assisted.  Psychiatric:        Mood and Affect: Mood and affect normal.   ASSESSMENT AND PLAN:  [name] was seen today for ED follow-up.  Diagnoses and all orders for this visit:  Wheezing Lung auscultation today negative for wheezing. Recommend Symbicort  80-4.5 mcg 2 puff in the morning at night for 3 to 4 weeks, she can use 2 extra puff throughout the day as needed for wheezing. Instructed to rinse after using inhaler. Monitor for new symptoms. Follow-up as needed.  -     Budesonide -Formoterol  Fumarate; Inhale 2 puffs into the lungs 2 (two) times daily.  Dispense: 1 each; Refill: 3  Type 2 diabetes mellitus with other specified complication, without long-term current use of insulin  (HCC) Last HgA1C was 6.4 in 02/2024.[name] Follows with endocrinologist.  Schizoaffective disorder, bipolar type (HCC) Stable otherwise. She follows with psychiatrist regularly.  Cough, unspecified type Robitussin seems to be helping with the cough, which is progressively getting  better. Chest x-ray done in the ED on 08/12/24  was negative for active cardiopulmonary  disease.  Return if symptoms worsen or fail to improve, for keep next appointment.  [name] Breau G. Wendell Nicoson, MD  [facility]. [place] [facility].     [1]  Current Outpatient Medications on File Prior to Visit  Medication Sig Dispense Refill   aspirin  (ASPIRIN  CHILDRENS) 81 MG chewable tablet Chew 1 tablet (81 mg total) by mouth daily. 30 tablet 0   buPROPion  (WELLBUTRIN  XL) 150 MG 24 hr tablet Take 1 tablet (150 mg total) by mouth daily. 30 tablet 0   diclofenac  Sodium (VOLTAREN ) 1 % GEL Apply 2 g topically 4 (four) times daily. 150 g 1   divalproex  (DEPAKOTE  ER) 250 MG 24 hr tablet Take 3 tablets (750 mg total) by mouth at bedtime. 30 tablet 0   hydrOXYzine  (ATARAX ) 25 MG tablet Take 1 tablet (25 mg total) by mouth 3 (three) times daily as needed for anxiety. 30 tablet 0   levothyroxine  (SYNTHROID ) 100 MCG tablet Take 1 tablet by mouth once daily 90 tablet 0   meclizine  (ANTIVERT ) 25 MG tablet Take 1 tablet (25 mg total) by mouth 3 (three) times daily as needed for dizziness. 30 tablet 0   mirabegron  ER (MYRBETRIQ ) 25 MG TB24 tablet Take 1 tablet (25 mg total) by mouth daily. 90 tablet 2   Multiple Vitamin (MULTIVITAMIN WITH MINERALS) TABS tablet Take 1 tablet by mouth daily.     pravastatin  (PRAVACHOL ) 20 MG tablet Take 1 tablet by mouth once daily 90 tablet 0   risperiDONE  (RISPERDAL ) 0.25 MG tablet Take 1 tablet (0.25 mg total) by mouth 2 (two) times daily at 8 am and 4 pm. (Patient taking differently: Take 0.25 mg by mouth 2 (two) times daily at 8 am and 4 pm. As needed) 60 tablet 0   traZODone  (DESYREL ) 50 MG tablet Take 1 tablet (50 mg total) by mouth at bedtime as needed for sleep. 30 tablet 0   FLUoxetine  (PROZAC ) 20 MG capsule Take 1 capsule (20 mg total) by mouth daily. (Patient not taking: Reported on 08/24/2024) 30 capsule 0   omeprazole  (PRILOSEC) 40 MG capsule Take 1 capsule (40 mg total)  by mouth daily. (Patient not taking: Reported on 08/24/2024) 60 capsule 0   No current facility-administered medications on file prior to visit.  [2]  Allergies Allergen Reactions   Morphine  And Codeine Nausea And Vomiting and Nausea Only   Ambien  [Zolpidem ] Other (See Comments)    Per patient this caused her to fall   Duloxetine Swelling    Edema   Latuda [Lurasidone Hcl]    Morphine  Nausea Only   Shingrix [Zoster Vac Recomb Adjuvanted]     Dizziness vomiting   Zithromax  [Azithromycin ]     Caused her mouth to be raw    "

## 2024-08-24 NOTE — Patient Instructions (Addendum)
 A few things to remember from today's visit:  Wheezing  Cough, unspecified type  Schizoaffective disorder, bipolar type (HCC)  Type 2 diabetes mellitus with other specified complication, without long-term current use of insulin  (HCC) Monitor for new symptoms. Symbicort  morning and night for 3-4 weeks then as needed. Rinse 2-3 times after use.  If you need refills for medications you take chronically, please call your pharmacy. Do not use My Chart to request refills or for acute issues that need immediate attention. If you send a my chart message, it may take a few days to be addressed, specially if I am not in the office.  Please be sure medication list is accurate. If a new problem present, please set up appointment sooner than planned today.

## 2024-08-29 ENCOUNTER — Encounter: Payer: Self-pay | Admitting: Family Medicine

## 2024-08-30 ENCOUNTER — Telehealth: Admitting: Family Medicine

## 2024-08-30 ENCOUNTER — Encounter: Payer: Self-pay | Admitting: Family Medicine

## 2024-08-30 VITALS — Ht 60.0 in

## 2024-08-30 DIAGNOSIS — R0982 Postnasal drip: Secondary | ICD-10-CM

## 2024-08-30 DIAGNOSIS — R059 Cough, unspecified: Secondary | ICD-10-CM

## 2024-08-30 MED ORDER — FLUTICASONE PROPIONATE 50 MCG/ACT NA SUSP: 1.0000 | Freq: Two times a day (BID) | NASAL | 1 refills | Status: AC

## 2024-08-30 MED ORDER — BENZONATATE 100 MG PO CAPS: 100.0000 mg | ORAL_CAPSULE | Freq: Two times a day (BID) | ORAL | 0 refills | Status: AC | PRN

## 2024-08-30 NOTE — Progress Notes (Signed)
 Virtual Visit via Video Note I connected with [name] on 08/30/2024 by a video enabled telemedicine application and verified that I am speaking with the correct person using two identifiers. Location patient: home Location provider:work office Persons participating in the virtual visit: patient, wife,provider  I discussed the limitations of evaluation and management by telemedicine and the availability of in person appointments. The patient expressed understanding and agreed to proceed.  Chief Complaint  Patient presents with   Cough    Yellow mucus    Discussed the use of AI scribe software for clinical note transcription with the patient, who gave verbal consent to proceed.  History of Present Illness [name] [name] is a 64 year old female with past medical history significant for DM 2, postsurgical hypothyroidism, autonomic dysfunction, IBS, and bipolar disorder being seen today via video for persistent cough. She was seen on 08/24/2024 after ED visit. She was seen in the ED on 08/12/2024 due to persistent cough, she was treated with azithromycin  and chest x-ray was done. It was recommended to try Symbicort  80-4.5 mcg 2 puff twice daily to help with the cough.  She is concerned about persistent productive cough with thick yellow mucus, but there is no hemoptysis. The severity of the cough disrupts her sleep, causing difficulty breathing due to mucus accumulation in her throat. She states that her wheezing has improved some. Cough is not getting worse, she is concerned about not getting any better. Tomorrow she seen her grand baby and she is concerned about being contagious. Negative for fever ,CP,and dyspnea. No abdominal pain, nausea, vomiting, or heartburn.  She has a history of bipolar disorder, which complicates the use of prednisone  as it can induce manic episodes.   ROS: See pertinent positives and negatives per HPI.  Past Medical History:  Diagnosis Date    Abnormal LFTs 04/06/2013   Very minor tried to reassure okay to repeat today with hepatitis C screen   Allergic rhinitis 03/26/2007   Autonomic dysfunction 01/11/2015   Bipolar 1 disorder    Hx of psychosis; present since age 89   Chest pain on breathing 03/16/2014   Chronic fatigue 06/21/2017   Chronic interstitial cystitis 11/15/2020   Community acquired pneumonia of right lung 08/17/2020   Complication of anesthesia    agitation   Constipation 05/17/2012   Costochondritis 02/14/2014   Diverticular disease of colon 08/06/2020   Double vision 03/15/2011   Dyspnea on effort 01/10/2011   Dysuria 02/15/2013   Early satiety 04/27/2013   Elevated lactic acid level    Endometriosis 05/11/2008   Fatty liver disease, nonalcoholic 02/13/2014   Fecal urgency 08/06/2020   Female stress incontinence 11/15/2020   Food aversion 11/08/2012   Frequency of urination    Gastroesophageal reflux disease 08/06/2020   Headache    History of duodenal ulcer    History of hysterectomy 11/15/2020   History of kidney stones    History of lithium  toxicity 05/01/2014   Hoarseness of voice 02/27/2016   Hyperglycemia 01/10/2011   Hyperlipidemia, mixed 05/12/2017   Hyperprolactinemia 06/26/2015   Insomnia 05/12/2017   Irritable bowel syndrome 08/06/2020   Jaw pain 02/13/2012   poss tmj area     Left upper quadrant pain 08/06/2020   Leukocytosis 05/01/2014   Lump of breast, left 12/20/2012   about 6 oclock    Mass of urethra 11/15/2020   Mild cognitive impairment of uncertain or unknown etiology 10/16/2020   Multinodular goiter 05/12/2017   Nocturia  Nondiabetic gastroparesis 01/11/2015   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 10/10/2021   Palpitations 12/08/2010   Pelvic pain    Periumbilical pain 08/06/2020   Plantar fasciitis 06/11/2007   Postsurgical hypothyroidism 09/03/2021   Pruritus ani 08/06/2020   Right ear pain 02/16/2012   Social  anxiety disorder    Subacute bronchitis 07/10/2018   Type II diabetes mellitus 10/10/2021   Unstable gait 07/05/2022   Urgency of urination    Vitamin B12 deficiency 09/03/2021   Vitamin D  deficiency 09/23/2011    Past Surgical History:  Procedure Laterality Date   BREAST BIOPSY Left 12/11/2023   MM LT BREAST BX W LOC DEV EA AD LESION IMG BX SPEC STEREO GUIDE 12/11/2023 [facility]   BREAST BIOPSY Left 12/11/2023   MM LT BREAST BX W LOC DEV 1ST LESION IMAGE BX SPEC STEREO GUIDE 12/11/2023 [facility]   CARDIAC CATHETERIZATION  10-09-1999  DR KATZ   NORMAL LVF/  NORMAL RCA/  NO CRITICAL DISEASE LEFT CORONARY SYSTEM   CARDIOVASCULAR STRESS TEST  01-23-2011   NORMAL NUCLEAR STUDY/ NO ISCHEMIA/ EF 81%   CYSTOSCOPY WITH BIOPSY N/A 06/03/2013   Procedure: CYSTOSCOPY WITH BIOPSY  INSTILLATION OF MARCAINE  AND PYRIDIUM ;  Surgeon: Thomasine Oiler, MD;  Location: [facility];  Service: Urology;  Laterality: N/A;   LAPAROSCOPIC CHOLECYSTECTOMY  11-08-2001   LAPAROSCOPIC LEFT SALPINGOOPHORECTOMY AND LYSYS ADHESIONS  03-10-2002   LAPAROSCOPIC REMOVAL OVARY REMNANT  2003   [place]   LAPAROSCOPY N/A 12/14/2012   Procedure: LAPAROSCOPY DIAGNOSTIC;  Surgeon: Jina Nephew, MD;  Location: [facility];  Service: General;  Laterality: N/A;   THYROIDECTOMY N/A 05/24/2021   Procedure: TOTAL THYROIDECTOMY;  Surgeon: Eletha Boas, MD;  Location: [facility];  Service: General;  Laterality: N/A;   TOTAL ABDOMINAL HYSTERECTOMY  2000   W/ RIGHT SALPINGOOPHORECTOMY   TRANSTHORACIC ECHOCARDIOGRAM  10-13-2010   NORMAL LVF/  EF 55-60%    Family History  Problem Relation Age of Onset   Other Mother        MAC infection   Anxiety disorder Mother    Dementia Mother        likely Alzheimer's disease; symptom onset on late 70s/early 80s   Sleep apnea Father    Depression Father    Alcohol  abuse Father    Hypertension Father    Migraines Sister        headaches   Anxiety disorder Sister     Depression Sister    Heart disease Paternal Grandmother    Hyperlipidemia Paternal Grandmother    Alcohol  abuse Paternal Grandfather    Diabetes Neg Hx     Social History   Socioeconomic History   Marital status: Married    Spouse name: Not on file   Number of children: 0   Years of education: 16   Highest education level: Bachelor's degree (e.g., BA, AB, BS)  Occupational History   Occupation: Disability    Comment: Nurse  Tobacco Use   Smoking status: Former    Current packs/day: 0.00    Types: Cigarettes    Start date: 1979    Quit date: 1980    Years since quitting: 46.0   Smokeless tobacco: Never   Tobacco comments:    ONLY SMOKED FOR 6 MONTHS --  QUIT  YRS AGO  Vaping Use   Vaping status: Never Used  Substance and Sexual Activity   Alcohol  use: Not Currently    Comment: occ   Drug use:  Never   Sexual activity: Not on file  Other Topics Concern   Not on file  Social History Narrative   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    no tobacco   Caffeine Use: very little, three times a week   Left handed    Social Drivers of Health   Tobacco Use: Medium Risk (08/30/2024)   Patient History    Smoking Tobacco Use: Former    Smokeless Tobacco Use: Never    Passive Exposure: Not on file  Financial Resource Strain: Low Risk (04/20/2024)   Overall Financial Resource Strain (CARDIA)    Difficulty of Paying Living Expenses: Not hard at all  Food Insecurity: No Food Insecurity (04/20/2024)   Epic    Worried About Radiation Protection Practitioner of Food in the Last Year: Never true    Ran Out of Food in the Last Year: Never true  Transportation Needs: No Transportation Needs (04/20/2024)   Epic    Lack of Transportation (Medical): No    Lack of Transportation (Non-Medical): No  Physical Activity: Sufficiently Active (04/20/2024)   Exercise Vital Sign    Days of  Exercise per Week: 7 days    Minutes of Exercise per Session: 30 min  Recent Concern: Physical Activity - Insufficiently Active (03/14/2024)   Exercise Vital Sign    Days of Exercise per Week: 3 days    Minutes of Exercise per Session: 10 min  Stress: No Stress Concern Present (04/20/2024)   [employer] of Occupational Health - Occupational Stress Questionnaire    Feeling of Stress: Not at all  Social Connections: Moderately Isolated (04/20/2024)   Social Connection and Isolation Panel    Frequency of Communication with Friends and Family: More than three times a week    Frequency of Social Gatherings with Friends and Family: More than three times a week    Attends Religious Services: Never    Database Administrator or Organizations: No    Attends Banker Meetings: Never    Marital Status: Married  Catering Manager Violence: Not At Risk (04/20/2024)   Epic    Fear of Current or Ex-Partner: No    Emotionally Abused: No    Physically Abused: No    Sexually Abused: No  Depression (PHQ2-9): Low Risk (04/20/2024)   Depression (PHQ2-9)    PHQ-2 Score: 0  Alcohol  Screen: Low Risk (04/20/2024)   Alcohol  Screen    Last Alcohol  Screening Score (AUDIT): 0  Housing: Unknown (04/20/2024)   Epic    Unable to Pay for Housing in the Last Year: No    Number of Times Moved in the Last Year: Not on file    Homeless in the Last Year: No  Utilities: Not At Risk (04/20/2024)   Epic    Threatened with loss of utilities: No  Health Literacy: Adequate Health Literacy (04/20/2024)   B1300 Health Literacy    Frequency of need for help with medical instructions: Never   Current Medications[1]  EXAM:  VITALS per patient if applicable:Ht 5' (1.524 m)   BMI 30.27 kg/m   GENERAL: alert, oriented, appears well and in no acute distress  HEENT: atraumatic, conjunctiva clear, no obvious abnormalities on inspection of external nose and ears  NECK: normal movements of the head and neck  LUNGS: on  inspection no signs of respiratory distress, breathing rate appears normal,  no obvious gross SOB, gasping or wheezing. Productive cough.  CV: no obvious cyanosis  MS: moves all visible extremities without noticeable abnormality  PSYCH/NEURO: pleasant and cooperative, no obvious depression or anxiety, speech and thought processing grossly intact  ASSESSMENT AND PLAN:  Discussed the following assessment and plan:  Cough, unspecified type We discussed possible etiologies. She was treated for a respiratory infection with azithromycin  on 08/12/2024.   CXR 08/12/24 was negative for cardiopulmonary disease. Currently she is on Symbicort  to treat wheezing, she cannot tolerate prednisone  because it causes maniac like symptoms. Explained that we do not need to repeat treatment with antibiotics, most likely postviral. Cough and congestion can last sometimes weeks after acute respiratory symptoms have resolved. Benzonatate  100 mg twice daily recommended. She is concerned because she is going to see her grandbaby tomorrow, I do not think she is contagious at this time. She has her Tdap vaccination up today. Monitor for new symptoms. She would like to have imaging repeated, so she is coming tomorrow for chest x-ray.  -     DG Chest 2 View; Future  Post-nasal drainage This could be aggravating her cough. Continue adequate hydration, plain Mucinex  may help, and recommend Flonase  nasal spray at bedtime for 10 to 14 days. We discussed some side effects of Flonase  nasal spray, which may include nosebleed.  -     Fluticasone  Propionate; Place 1 spray into both nostrils 2 (two) times daily.  Dispense: 16 g; Refill: 1  We discussed possible serious and likely etiologies, options for evaluation and workup, limitations of telemedicine visit vs in person visit, treatment, treatment risks and precautions. The patient was advised to call back or seek an in-person evaluation if the symptoms worsen or if the  condition fails to improve as anticipated. I discussed the assessment and treatment plan with the patient. The patient was provided an opportunity to ask questions and all were answered. The patient agreed with the plan and demonstrated an understanding of the instructions.  Return if symptoms worsen or fail to improve, for keep next appointment.  Trevell Pariseau, MD      [1]  Current Outpatient Medications:    aspirin  (ASPIRIN  CHILDRENS) 81 MG chewable tablet, Chew 1 tablet (81 mg total) by mouth daily., Disp: 30 tablet, Rfl: 0   budesonide -formoterol  (SYMBICORT ) 80-4.5 MCG/ACT inhaler, Inhale 2 puffs into the lungs 2 (two) times daily., Disp: 1 each, Rfl: 3   buPROPion  (WELLBUTRIN  XL) 150 MG 24 hr tablet, Take 1 tablet (150 mg total) by mouth daily., Disp: 30 tablet, Rfl: 0   diclofenac  Sodium (VOLTAREN ) 1 % GEL, Apply 2 g topically 4 (four) times daily., Disp: 150 g, Rfl: 1   divalproex  (DEPAKOTE  ER) 250 MG 24 hr tablet, Take 3 tablets (750 mg total) by mouth at bedtime., Disp: 30 tablet, Rfl: 0   FLUoxetine  (PROZAC ) 20 MG capsule, Take 1 capsule (20 mg total) by mouth daily., Disp: 30 capsule, Rfl: 0   fluticasone  (FLONASE ) 50 MCG/ACT nasal spray, Place 1 spray into both nostrils 2 (two) times daily., Disp: 16 g, Rfl: 1   hydrOXYzine  (ATARAX ) 25 MG tablet, Take 1 tablet (25 mg total) by mouth 3 (three) times daily as needed for anxiety., Disp: 30 tablet, Rfl: 0   levothyroxine  (SYNTHROID ) 100 MCG tablet, Take 1 tablet by mouth once daily, Disp: 90 tablet, Rfl: 0   meclizine  (ANTIVERT ) 25 MG tablet, Take 1 tablet (25 mg total) by mouth 3 (three) times daily as needed for dizziness., Disp: 30  tablet, Rfl: 0   mirabegron  ER (MYRBETRIQ ) 25 MG TB24 tablet, Take 1 tablet (25 mg total) by mouth daily., Disp: 90 tablet, Rfl: 2   Multiple Vitamin (MULTIVITAMIN WITH MINERALS) TABS tablet, Take 1 tablet by mouth daily., Disp: , Rfl:    omeprazole  (PRILOSEC) 40 MG capsule, Take 1 capsule (40 mg total) by  mouth daily., Disp: 60 capsule, Rfl: 0   pravastatin  (PRAVACHOL ) 20 MG tablet, Take 1 tablet by mouth once daily, Disp: 90 tablet, Rfl: 0   risperiDONE  (RISPERDAL ) 0.25 MG tablet, Take 1 tablet (0.25 mg total) by mouth 2 (two) times daily at 8 am and 4 pm. (Patient taking differently: Take 0.25 mg by mouth 2 (two) times daily at 8 am and 4 pm. As needed), Disp: 60 tablet, Rfl: 0   traZODone  (DESYREL ) 50 MG tablet, Take 1 tablet (50 mg total) by mouth at bedtime as needed for sleep., Disp: 30 tablet, Rfl: 0

## 2024-09-08 NOTE — Progress Notes (Signed)
 " Name: [name]  MRN/ DOB: [id number], 02-Jul-1961   Age/ Sex: 64 y.o., female    PCP: Jordan, [name], MD   Reason for Endocrinology Evaluation: Type 2 Diabetes Mellitus     Date of Initial Endocrinology Visit: 10/10/2021    PATIENT IDENTIFIER: [name] is a 64 y.o. female with a past medical history of T2DM, dyslipidemia hypothyroidism, mild cognitive impairment. The patient presented for initial endocrinology clinic visit on 10/10/2021 for consultative assistance with her diabetes management.    HPI: [name]    Diagnosed with DM 09/2021              Hemoglobin A1c has ranged from 5.0% in 2022, peaking at 6.9% in 2023.  On her initial visit with me she had an A1c of 6.9%, she was on metformin  only   Despite having chronic diarrhea that per patient was started before initiation of metformin , she opted to discontinue metformin  by 04/2023 due to worsening diarrhea.  A1c at the time 6.0% and we opted to remain off glycemic agents     THYROID  HISTORY :  She is S/P total thyroidectomy due to enlarging MNG with pathology report consistent with NIFTP on 05/24/2021.  Surgical resection is curative.  Father with thyroid  cancer   On disability for Bipolar d/o    SUBJECTIVE:   During the last visit (03/11/2024): A1c 6.4%   Today (09/09/24): [name] is here for follow-up on diabetes management of thyroid  disease.  She is accompanied by her spouse [name]. She checks blood sugars occasionally .    Continues with cough  No nausea  No constipation but has  diarrhea and is on Cholestyramine  No palpitations  No local neck neck swelling  No biotin  Continues with fatigue     HOME ENDOCRINE REGIMEN: Levothyroxine  100 mcg daily    Statin: yes ACE-I/ARB: no Prior Diabetic Education: 03/27/2022   METER DOWNLOAD SUMMARY: n/a      DIABETIC COMPLICATIONS: Microvascular complications:   Denies: CKD, neuropathy  Last eye exam: Completed  10/26/2023  Macrovascular complications:   Denies: CAD, PVD, CVA   PAST HISTORY: Past Medical History:  Past Medical History:  Diagnosis Date   Abnormal LFTs 04/06/2013   Very minor tried to reassure okay to repeat today with hepatitis C screen   Allergic rhinitis 03/26/2007   Autonomic dysfunction 01/11/2015   Bipolar 1 disorder    Hx of psychosis; present since age 22   Chest pain on breathing 03/16/2014   Chronic fatigue 06/21/2017   Chronic interstitial cystitis 11/15/2020   Community acquired pneumonia of right lung 08/17/2020   Complication of anesthesia    agitation   Constipation 05/17/2012   Costochondritis 02/14/2014   Diverticular disease of colon 08/06/2020   Double vision 03/15/2011   Dyspnea on effort 01/10/2011   Dysuria 02/15/2013   Early satiety 04/27/2013   Elevated lactic acid level    Endometriosis 05/11/2008   Fatty liver disease, nonalcoholic 02/13/2014   Fecal urgency 08/06/2020   Female stress incontinence 11/15/2020   Food aversion 11/08/2012   Frequency of urination    Gastroesophageal reflux disease 08/06/2020   Headache    History of duodenal ulcer    History of hysterectomy 11/15/2020   History of kidney stones    History of lithium  toxicity 05/01/2014   Hoarseness of voice 02/27/2016   Hyperglycemia 01/10/2011   Hyperlipidemia, mixed 05/12/2017   Hyperprolactinemia 06/26/2015   Insomnia 05/12/2017   Irritable bowel syndrome  08/06/2020   Jaw pain 02/13/2012   poss tmj area     Left upper quadrant pain 08/06/2020   Leukocytosis 05/01/2014   Lump of breast, left 12/20/2012   about 6 oclock    Mass of urethra 11/15/2020   Mild cognitive impairment of uncertain or unknown etiology 10/16/2020   Multinodular goiter 05/12/2017   Nocturia    Nondiabetic gastroparesis 01/11/2015   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 10/10/2021   Palpitations 12/08/2010   Pelvic pain    Periumbilical pain  08/06/2020   Plantar fasciitis 06/11/2007   Postsurgical hypothyroidism 09/03/2021   Pruritus ani 08/06/2020   Right ear pain 02/16/2012   Social anxiety disorder    Subacute bronchitis 07/10/2018   Type II diabetes mellitus 10/10/2021   Unstable gait 07/05/2022   Urgency of urination    Vitamin B12 deficiency 09/03/2021   Vitamin D  deficiency 09/23/2011   Past Surgical History:  Past Surgical History:  Procedure Laterality Date   BREAST BIOPSY Left 12/11/2023   MM LT BREAST BX W LOC DEV EA AD LESION IMG BX SPEC STEREO GUIDE 12/11/2023 [facility]   BREAST BIOPSY Left 12/11/2023   MM LT BREAST BX W LOC DEV 1ST LESION IMAGE BX SPEC STEREO GUIDE 12/11/2023 [facility]   CARDIAC CATHETERIZATION  10-09-1999  DR KATZ   NORMAL LVF/  NORMAL RCA/  NO CRITICAL DISEASE LEFT CORONARY SYSTEM   CARDIOVASCULAR STRESS TEST  01-23-2011   NORMAL NUCLEAR STUDY/ NO ISCHEMIA/ EF 81%   CYSTOSCOPY WITH BIOPSY N/A 06/03/2013   Procedure: CYSTOSCOPY WITH BIOPSY  INSTILLATION OF MARCAINE  AND PYRIDIUM ;  Surgeon: Thomasine Oiler, MD;  Location: [facility];  Service: Urology;  Laterality: N/A;   LAPAROSCOPIC CHOLECYSTECTOMY  11-08-2001   LAPAROSCOPIC LEFT SALPINGOOPHORECTOMY AND LYSYS ADHESIONS  03-10-2002   LAPAROSCOPIC REMOVAL OVARY REMNANT  2003   [place]   LAPAROSCOPY N/A 12/14/2012   Procedure: LAPAROSCOPY DIAGNOSTIC;  Surgeon: Jina Nephew, MD;  Location: [facility];  Service: General;  Laterality: N/A;   THYROIDECTOMY N/A 05/24/2021   Procedure: TOTAL THYROIDECTOMY;  Surgeon: Eletha Boas, MD;  Location: [facility];  Service: General;  Laterality: N/A;   TOTAL ABDOMINAL HYSTERECTOMY  2000   W/ RIGHT SALPINGOOPHORECTOMY   TRANSTHORACIC ECHOCARDIOGRAM  10-13-2010   NORMAL LVF/  EF 55-60%    Social History:  reports that she quit smoking about 46 years ago. Her smoking use included cigarettes. She started smoking about 47 years ago. She has never used smokeless tobacco. She reports  that she does not currently use alcohol . She reports that she does not use drugs. Family History:  Family History  Problem Relation Age of Onset   Other Mother        MAC infection   Anxiety disorder Mother    Dementia Mother        likely Alzheimer's disease; symptom onset on late 70s/early 80s   Sleep apnea Father    Depression Father    Alcohol  abuse Father    Hypertension Father    Migraines Sister        headaches   Anxiety disorder Sister    Depression Sister    Heart disease Paternal Grandmother    Hyperlipidemia Paternal Grandmother    Alcohol  abuse Paternal Grandfather    Diabetes Neg Hx      HOME MEDICATIONS: Allergies as of 09/09/2024       Reactions   Morphine  And Codeine Nausea And Vomiting, Nausea Only  Ambien  [zolpidem ] Other (See Comments)   Per patient this caused her to fall   Duloxetine Swelling   Edema   Latuda [lurasidone Hcl]    Morphine  Nausea Only   Shingrix [zoster Vac Recomb Adjuvanted]    Dizziness vomiting   Zithromax  [azithromycin ]    Caused her mouth to be raw         Medication List        Accurate as of September 09, 2024  1:55 PM. If you have any questions, ask your nurse or doctor.          aspirin  81 MG chewable tablet Commonly known as: Aspirin  Childrens Chew 1 tablet (81 mg total) by mouth daily.   benzonatate  100 MG capsule Commonly known as: TESSALON  Take 1 capsule (100 mg total) by mouth 2 (two) times daily as needed for up to 10 days.   budesonide -formoterol  80-4.5 MCG/ACT inhaler Commonly known as: SYMBICORT  Inhale 2 puffs into the lungs 2 (two) times daily.   buPROPion  150 MG 24 hr tablet Commonly known as: WELLBUTRIN  XL Take 1 tablet (150 mg total) by mouth daily.   diclofenac  Sodium 1 % Gel Commonly known as: Voltaren  Apply 2 [name] topically 4 (four) times daily.   divalproex  250 MG 24 hr tablet Commonly known as: DEPAKOTE  ER Take 3 tablets (750 mg total) by mouth at bedtime.   FLUoxetine  20 MG  capsule Commonly known as: PROZAC  Take 1 capsule (20 mg total) by mouth daily.   fluticasone  50 MCG/ACT nasal spray Commonly known as: FLONASE  Place 1 spray into both nostrils 2 (two) times daily.   hydrOXYzine  25 MG tablet Commonly known as: ATARAX  Take 1 tablet (25 mg total) by mouth 3 (three) times daily as needed for anxiety.   levothyroxine  100 MCG tablet Commonly known as: SYNTHROID  Take 1 tablet by mouth once daily   meclizine  25 MG tablet Commonly known as: ANTIVERT  Take 1 tablet (25 mg total) by mouth 3 (three) times daily as needed for dizziness.   mirabegron  ER 25 MG Tb24 tablet Commonly known as: Myrbetriq  Take 1 tablet (25 mg total) by mouth daily.   multivitamin with minerals Tabs tablet Take 1 tablet by mouth daily.   omeprazole  40 MG capsule Commonly known as: PRILOSEC Take 1 capsule (40 mg total) by mouth daily.   pravastatin  20 MG tablet Commonly known as: PRAVACHOL  Take 1 tablet by mouth once daily   risperiDONE  0.25 MG tablet Commonly known as: RISPERDAL  Take 1 tablet (0.25 mg total) by mouth 2 (two) times daily at 8 am and 4 pm. What changed: additional instructions   traZODone  50 MG tablet Commonly known as: DESYREL  Take 1 tablet (50 mg total) by mouth at bedtime as needed for sleep.         ALLERGIES: Allergies  Allergen Reactions   Morphine  And Codeine Nausea And Vomiting and Nausea Only   Ambien  [Zolpidem ] Other (See Comments)    Per patient this caused her to fall   Duloxetine Swelling    Edema   Latuda [Lurasidone Hcl]    Morphine  Nausea Only   Shingrix [Zoster Vac Recomb Adjuvanted]     Dizziness vomiting   Zithromax  [Azithromycin ]     Caused her mouth to be raw      REVIEW OF SYSTEMS: A comprehensive ROS was conducted with the patient and is negative except as per HPI     OBJECTIVE:   VITAL SIGNS: BP 120/80   Ht 5' (1.524 m)   [employer]  156 lb (70.8 kg)   BMI 30.47 kg/m    PHYSICAL EXAM:  General: Pt appears well  and is in NAD  Neck: General: Supple without adenopathy or carotid bruits. Thyroid :.  No goiter or nodules appreciated.   Lungs: Clear with good BS bilat   Heart: RRR   Extremities:  Lower extremities - No pretibial edema  Neuro: MS is good with appropriate affect, pt is alert and Ox3    DM foot exam: 12/09/2023 per podiatry    DATA REVIEWED:  Lab Results  Component Value Date   HGBA1C 6.6 (A) 09/09/2024   HGBA1C 6.4 (A) 03/11/2024   HGBA1C 6.0 (A) 05/01/2023    Latest Reference Range & Units 09/09/24 12:42  TSH 0.40 - 4.50 mIU/L 13.63 (H)  T4,Free(Direct) 0.8 - 1.8 ng/dL 1.0  (H): Data is abnormally high   Latest Reference Range & Units 07/21/24 17:54  Sodium 135 - 145 mmol/L 139  Potassium 3.5 - 5.1 mmol/L 4.1  Chloride 98 - 111 mmol/L 105  CO2 22 - 32 mmol/L 23  Glucose 70 - 99 mg/dL 91  BUN 8 - 23 mg/dL 13  Creatinine 9.55 - 8.99 mg/dL 9.23  Calcium  8.9 - 10.3 mg/dL 9.2  Anion gap 5 - 15  12  Magnesium  1.7 - 2.4 mg/dL 2.0  Alkaline Phosphatase 38 - 126 U/L 90  Albumin 3.5 - 5.0 [name]/dL 4.1  Lipase 11 - 51 U/L 19  AST 15 - 41 U/L 64 (H)  ALT 0 - 44 U/L 44  Total Protein 6.5 - 8.1 [name]/dL 7.4  Total Bilirubin 0.0 - 1.2 mg/dL 0.7  GFR, Estimated >39 mL/min >60    Thyroid  Pathology 05/24/2021  FINAL MICROSCOPIC DIAGNOSIS:   A. THYROID , TOTAL THYROIDECTOMY:  -  Noninvasive follicular thyroid  neoplasm with papillary-like nuclear  features (NIFTP, 0.15 cm, left)  -  Follicular adenoma (5.1 cm, right)    Old records , labs and images have been reviewed.    ASSESSMENT / PLAN / RECOMMENDATIONS:   1) Type 2 Diabetes Mellitus, optimally controlled, With out complications - Most recent A1c of 6.6%. Goal A1c < 7.0 %.    -A1c remains optimal -Intolerant to metformin  due to chronic diarrhea - Patient will continue with lifestyle changes  MEDICATIONS: N/a  EDUCATION / INSTRUCTIONS: BG monitoring instructions: Patient is instructed to check her blood sugars 3 times  a week  2) Diabetic complications:  Eye: Does not have known diabetic retinopathy.   Neuro/ Feet: Does not have known diabetic peripheral neuropathy. Renal: Patient does not have known baseline CKD. She is not on an ACEI/ARB at present.    3)Postoperative Hypothyroidism :  - Patient is clinically euthyroid -We discussed the importance of doubling up on the dose of levothyroxine  should she forget to take it 1 day.  I have also asked the patient to separate all vitamins, OTC supplements including Colesytramine by 4 hours from levothyroxine  - Her TSH is elevated, but I suspect this is absorption interference, no changes at this time but will recheck in 6 weeks    Medication Continue levothyroxine  100 mcg daily   Follow-up in 6 months  Signed electronically by: Stefano Redgie Butts, MD  [facility] Endocrinology  [facility] [address] [place] [place], [place] [address] Phone: [phone] FAX: [phone]   CC: Jordan, [name], MD [address] [place] [address] Phone: [phone]  Fax: [phone]    Return to Endocrinology clinic as below: Future Appointments  Date  Time Provider Department Center  09/09/2024  2:15 PM [facility]/NEURO LAB [facility] None  09/12/2024  4:15 PM Janit Thresa HERO, DPM [facility]  03/13/2025  1:20 PM Shihab States, Donell Cardinal, MD [facility] None  04/26/2025 10:40 AM [facility]-ANNUAL WELLNESS VISIT [facility] [address]    "

## 2024-09-09 ENCOUNTER — Ambulatory Visit: Admitting: Internal Medicine

## 2024-09-09 ENCOUNTER — Encounter: Payer: Self-pay | Admitting: Internal Medicine

## 2024-09-09 ENCOUNTER — Other Ambulatory Visit

## 2024-09-09 VITALS — BP 120/80 | Ht 60.0 in | Wt 156.0 lb

## 2024-09-09 DIAGNOSIS — E1142 Type 2 diabetes mellitus with diabetic polyneuropathy: Secondary | ICD-10-CM | POA: Diagnosis not present

## 2024-09-09 DIAGNOSIS — E890 Postprocedural hypothyroidism: Secondary | ICD-10-CM | POA: Diagnosis not present

## 2024-09-09 LAB — TSH: TSH: 13.63 m[IU]/L — ABNORMAL HIGH (ref 0.40–4.50)

## 2024-09-09 LAB — POCT GLYCOSYLATED HEMOGLOBIN (HGB A1C): Hemoglobin A1C: 6.6 % — AB (ref 4.0–5.6)

## 2024-09-09 LAB — T4, FREE: Free T4: 1 ng/dL (ref 0.8–1.8)

## 2024-09-09 NOTE — Patient Instructions (Signed)

## 2024-09-12 ENCOUNTER — Ambulatory Visit: Admitting: Podiatry

## 2024-09-13 ENCOUNTER — Ambulatory Visit: Payer: Self-pay | Admitting: Internal Medicine

## 2024-09-13 NOTE — Telephone Encounter (Signed)
 Please let the patient know that her thyroid  function is abnormal, I do suspect this has to do with the cholestyramine that she has been taking close to levothyroxine  intake.   No changes at this time, as discussed during the office visit they need to separate taking cholestyramine by at least 4 hours from levothyroxine    Please schedule the patient for repeat labs in 6 weeks   Thanks

## 2024-09-14 NOTE — Telephone Encounter (Signed)
 Attempted to reach patient but phone was off.  Left message on Christys phone for patient to contact the office or review mychart.

## 2025-03-13 ENCOUNTER — Ambulatory Visit: Admitting: Internal Medicine

## 2025-04-26 ENCOUNTER — Ambulatory Visit
